# Patient Record
Sex: Male | Born: 1970 | Race: Black or African American | Hispanic: No | Marital: Married | Smoking: Never smoker
Health system: Southern US, Community
[De-identification: ages and names within clinical notes are randomized; demographics above are authoritative.]

## PROBLEM LIST (undated history)

## (undated) ENCOUNTER — Inpatient Hospital Stay (HOSPITAL_COMMUNITY): Admission: RE | Payer: BLUE CROSS/BLUE SHIELD | Source: Intra-hospital | Admitting: Physical Medicine & Rehabilitation

## (undated) DIAGNOSIS — I10 Essential (primary) hypertension: Secondary | ICD-10-CM

## (undated) DIAGNOSIS — E119 Type 2 diabetes mellitus without complications: Secondary | ICD-10-CM

## (undated) DIAGNOSIS — G629 Polyneuropathy, unspecified: Secondary | ICD-10-CM

## (undated) DIAGNOSIS — N186 End stage renal disease: Secondary | ICD-10-CM

## (undated) DIAGNOSIS — M069 Rheumatoid arthritis, unspecified: Secondary | ICD-10-CM

## (undated) DIAGNOSIS — I5032 Chronic diastolic (congestive) heart failure: Secondary | ICD-10-CM

## (undated) DIAGNOSIS — I739 Peripheral vascular disease, unspecified: Secondary | ICD-10-CM

## (undated) DIAGNOSIS — Z9289 Personal history of other medical treatment: Secondary | ICD-10-CM

## (undated) DIAGNOSIS — I4892 Unspecified atrial flutter: Secondary | ICD-10-CM

## (undated) DIAGNOSIS — Z992 Dependence on renal dialysis: Secondary | ICD-10-CM

## (undated) DIAGNOSIS — I499 Cardiac arrhythmia, unspecified: Secondary | ICD-10-CM

## (undated) DIAGNOSIS — J189 Pneumonia, unspecified organism: Secondary | ICD-10-CM

## (undated) DIAGNOSIS — D649 Anemia, unspecified: Secondary | ICD-10-CM

## (undated) HISTORY — DX: Chronic diastolic (congestive) heart failure: I50.32

## (undated) HISTORY — PX: CHOLECYSTECTOMY: SHX55

---
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
## 2001-02-15 ENCOUNTER — Emergency Department (HOSPITAL_COMMUNITY): Admission: EM | Admit: 2001-02-15 | Discharge: 2001-02-16 | Payer: Self-pay | Admitting: Emergency Medicine

## 2004-11-12 ENCOUNTER — Ambulatory Visit (HOSPITAL_COMMUNITY): Admission: RE | Admit: 2004-11-12 | Discharge: 2004-11-12 | Payer: Self-pay | Admitting: Nephrology

## 2008-10-23 ENCOUNTER — Emergency Department (HOSPITAL_COMMUNITY): Admission: EM | Admit: 2008-10-23 | Discharge: 2008-10-23 | Payer: Self-pay | Admitting: Emergency Medicine

## 2010-04-06 ENCOUNTER — Encounter: Payer: Self-pay | Admitting: Nephrology

## 2010-07-18 ENCOUNTER — Emergency Department (HOSPITAL_COMMUNITY): Payer: No Typology Code available for payment source

## 2010-07-18 ENCOUNTER — Emergency Department (HOSPITAL_COMMUNITY)
Admission: EM | Admit: 2010-07-18 | Discharge: 2010-07-18 | Disposition: A | Discharge status: Home / Self Care | Payer: No Typology Code available for payment source | Attending: Emergency Medicine | Admitting: Emergency Medicine

## 2010-07-18 DIAGNOSIS — M549 Dorsalgia, unspecified: Secondary | ICD-10-CM | POA: Insufficient documentation

## 2010-07-18 DIAGNOSIS — M542 Cervicalgia: Secondary | ICD-10-CM | POA: Insufficient documentation

## 2010-07-18 DIAGNOSIS — E119 Type 2 diabetes mellitus without complications: Secondary | ICD-10-CM | POA: Insufficient documentation

## 2010-07-18 DIAGNOSIS — I10 Essential (primary) hypertension: Secondary | ICD-10-CM | POA: Insufficient documentation

## 2010-07-18 DIAGNOSIS — S139XXA Sprain of joints and ligaments of unspecified parts of neck, initial encounter: Secondary | ICD-10-CM | POA: Insufficient documentation

## 2010-07-18 DIAGNOSIS — S335XXA Sprain of ligaments of lumbar spine, initial encounter: Secondary | ICD-10-CM | POA: Insufficient documentation

## 2012-05-23 ENCOUNTER — Emergency Department (HOSPITAL_COMMUNITY): Payer: Self-pay

## 2012-05-23 ENCOUNTER — Encounter (HOSPITAL_COMMUNITY): Payer: Self-pay | Admitting: *Deleted

## 2012-05-23 ENCOUNTER — Emergency Department (HOSPITAL_COMMUNITY)
Admission: EM | Admit: 2012-05-23 | Discharge: 2012-05-24 | Disposition: A | Discharge status: Home / Self Care | Payer: Self-pay | Attending: Emergency Medicine | Admitting: Emergency Medicine

## 2012-05-23 DIAGNOSIS — K089 Disorder of teeth and supporting structures, unspecified: Secondary | ICD-10-CM | POA: Insufficient documentation

## 2012-05-23 DIAGNOSIS — I10 Essential (primary) hypertension: Secondary | ICD-10-CM | POA: Insufficient documentation

## 2012-05-23 DIAGNOSIS — I509 Heart failure, unspecified: Secondary | ICD-10-CM | POA: Insufficient documentation

## 2012-05-23 HISTORY — DX: Essential (primary) hypertension: I10

## 2012-05-23 NOTE — ED Notes (Signed)
Coughing up "rust color"  Phlegm, and dental pain left lower jaw

## 2012-05-23 NOTE — ED Notes (Signed)
[id number] Expected date:<BR> Expected time:<BR> Means of arrival:<BR> Comments:<BR> Drawing blood room for April

## 2012-05-24 LAB — POCT I-STAT, CHEM 8
Calcium, Ion: 1.09 mmol/L — ABNORMAL LOW (ref 1.12–1.23)
Creatinine, Ser: 1.1 mg/dL (ref 0.50–1.35)
Glucose, Bld: 151 mg/dL — ABNORMAL HIGH (ref 70–99)
Hemoglobin: 13.3 g/dL (ref 13.0–17.0)
TCO2: 29 mmol/L (ref 0–100)

## 2012-05-24 MED ORDER — LISINOPRIL 20 MG PO TABS
20.0000 mg | ORAL_TABLET | Freq: Once | ORAL | Status: AC
  Administered 2012-05-24: 20 mg via ORAL
  Filled 2012-05-24: qty 1

## 2012-05-24 MED ORDER — ACETAMINOPHEN 325 MG PO TABS
650.0000 mg | ORAL_TABLET | Freq: Once | ORAL | Status: AC
  Administered 2012-05-24: 650 mg via ORAL
  Filled 2012-05-24: qty 2

## 2012-05-24 MED ORDER — PENICILLIN V POTASSIUM 500 MG PO TABS: 500.0000 mg | ORAL_TABLET | Freq: Three times a day (TID) | ORAL | Status: DC

## 2012-05-24 MED ORDER — HYDROCODONE-ACETAMINOPHEN 5-325 MG PO TABS: 2.0000 | ORAL_TABLET | ORAL | Status: DC | PRN

## 2012-05-24 MED ORDER — HYDROCHLOROTHIAZIDE 25 MG PO TABS
25.0000 mg | ORAL_TABLET | Freq: Once | ORAL | Status: AC
  Administered 2012-05-24: 25 mg via ORAL
  Filled 2012-05-24: qty 1

## 2012-05-24 MED ORDER — PENICILLIN V POTASSIUM 500 MG PO TABS
500.0000 mg | ORAL_TABLET | Freq: Once | ORAL | Status: AC
  Administered 2012-05-24: 500 mg via ORAL
  Filled 2012-05-24: qty 1

## 2012-05-24 NOTE — ED Notes (Signed)
MD at bedside. 

## 2012-05-24 NOTE — ED Provider Notes (Signed)
Complains of cough for one week accompanied. Also complains of toothache for the past several days at left lower premolar. On exam patient is in no distress left lower premolar has a Cari and is tender,. Neck supple no JVD lungs scant rales at bases bilaterally abdomen morbidly obese heart regular rate and rhythm. Extremities without edema  Date: 05/24/2012  Rate: 95  Rhythm: normal sinus rhythm  QRS Axis: normal  Intervals: normal  ST/T Wave abnormalities: nonspecific T wave changes  Conduction Disutrbances:none  Narrative Interpretation:   Old EKG Reviewed: none available      Patient felt to be in mild congestive heart failure. He reports that he's been noncompliant with his lisinopril was and hydrochlorothiazide. He has the medication at home. Spoke with Dr. Margie Ege cardiology. Plan patient to call today to schedule appointment for within the next week. Also given dental referral. Prescription for penicillin, Norco. Diagnosis #1 congestive heart failure #2 dental pain 3 hypertension 4 medication noncompliance  Orlie Dakin, MD 05/24/12 [phone]

## 2012-05-24 NOTE — ED Provider Notes (Signed)
History     CSN: [id number]  Arrival date & time 05/23/12  2207   First MD Initiated Contact with Patient 05/23/12 2358      Chief Complaint  Patient presents with  . Cough    "rust color"  . Dental Pain    left lower jaw    (Consider location/radiation/quality/duration/timing/severity/associated sxs/prior treatment) HPI Comments: Patient presents today with a chief complaint of productive cough for the past week.  Cough gradually worsening.  He has been taking Robitussin for the cough, but does not feel that it is helping.  He denies SOB or chest pain.  Denies fever or chills.  He denies DOE, orthopnea, or PND.  No prior history of CHF.  He currently does not smoke.  No history of COPD or Asthma.  He has a history of HTN and currently takes Lisinopril-HCTZ and Metoprolol.  He reports that he is taking the medication as prescribed.  He took the medication earlier today.  He has also had lower extremity edema bilaterally for the past several months.    He is also complaining of left lower molar dental pain.  He reports that the pain has been present for the past 2 months, which is gradually worsening.  [name]  He has not been taking anything for pain.  No acute dental injury.  He denies fever, chills, or facial swelling.  He currently does not have a dentist.  PCP is Dr. Mare Ferrari.    Patient is a 42 y.o. male presenting with tooth pain. The history is provided by the patient.  Dental PainThe primary symptoms include mouth pain and cough. Primary symptoms do not include dental injury, fever or shortness of breath.  Additional symptoms include: gum swelling and gum tenderness. Additional symptoms do not include: purulent gums, trismus, facial swelling, trouble swallowing and drooling.    Past Medical History  Diagnosis Date  . Hypertension     No past surgical history on file.  History reviewed. No pertinent family history.  History  Substance Use Topics  . Smoking status: Never Smoker    . Smokeless tobacco: Not on file  . Alcohol Use: No      Review of Systems  Constitutional: Negative for fever and chills.  HENT: Positive for congestion, rhinorrhea and dental problem. Negative for facial swelling, drooling and trouble swallowing.   Respiratory: Positive for cough. Negative for shortness of breath.        Denies DOE, orthopnea, or PND  Cardiovascular: Positive for leg swelling. Negative for chest pain.  All other systems reviewed and are negative.    Allergies  Review of patient's allergies indicates no known allergies.  Home Medications  No current outpatient prescriptions on file.  BP 173/122  Pulse 94  Temp(Src) 98.9 F (37.2 C) (Oral)  Resp 21  SpO2 95%  Physical Exam  Nursing note and vitals reviewed. Constitutional: He appears well-developed and well-nourished. No distress.  HENT:  Head: Normocephalic and atraumatic. No trismus in the jaw.  Mouth/Throat: Uvula is midline and oropharynx is clear and moist. Dental caries present. No dental abscesses.  No sublingual tenderness No tongue elevation No sublingual or submental lymphadenopathy Left lower lower carie.  Gingival tenderness to palpation.  No dental abscess.    Eyes: EOM are normal. Pupils are equal, round, and reactive to light.  Neck: Normal range of motion. Neck supple. No JVD present.  Cardiovascular: Normal rate, regular rhythm and normal heart sounds.   Pulmonary/Chest: Effort normal and breath sounds normal.  No respiratory distress. He has no wheezes. He has no rales.  Musculoskeletal:  1+ bilateral pitting edema from mid shin distallly  Neurological: He is alert.  Skin: Skin is warm and dry. He is not diaphoretic.  Psychiatric: He has a normal mood and affect.    ED Course  Procedures (including critical care time)  Labs Reviewed - No data to display Dg Chest 2 View  05/24/2012  *RADIOLOGY REPORT*  Clinical Data: Cough, dental pain  CHEST - 2 VIEW  Comparison: None   Findings: Enlargement of cardiac silhouette with pulmonary vascular congestion. Minimally tortuous thoracic aorta. Peribronchial thickening without focal consolidation or acute failure. No pleural effusion or pneumothorax. No acute osseous findings.  IMPRESSION: Enlargement of cardiac silhouette with pulmonary vascular congestion. Minimal bronchitic changes.   Original Report Authenticated By: Lavonia Dana, M.D.      No diagnosis found.  2:00 AM Patient signed out to Dr. Winfred Leeds.  Cardiology will be consulted to arrange follow up appointment.    MDM  Patient presenting with a productive cough for the past week.  CXR shows vascular congestion and cardiomegaly.  BNP elevated at 3399.  No prior history of CHF.  Patient appears to have a mild new onset CHF.  Patient is not hypoxic and not in respiratory distress.  Dr. Winfred Leeds will contact Cardiology to arrange for close follow up since the patient does not have a Cardiologist at this time.  Patient also presenting with dental pain.  No gross dental abscess.  Exam unconcerning for Ludwig's angina or spread of infection.          Sherlyn Lees [place], PA-C 05/24/12 1304

## 2012-05-26 NOTE — ED Provider Notes (Signed)
Medical screening examination/treatment/procedure(s) were conducted as a shared visit with non-physician practitioner(s) and myself.  I personally evaluated the patient during the encounter  Orlie Dakin, MD 05/26/12 1732

## 2012-06-10 ENCOUNTER — Institutional Professional Consult (permissible substitution): Payer: Self-pay | Admitting: Cardiovascular Disease

## 2012-07-14 ENCOUNTER — Encounter: Payer: Self-pay | Admitting: Cardiovascular Disease

## 2012-07-14 ENCOUNTER — Ambulatory Visit (INDEPENDENT_AMBULATORY_CARE_PROVIDER_SITE_OTHER): Payer: BC Managed Care – PPO | Admitting: Cardiovascular Disease

## 2012-07-14 VITALS — BP 160/120 | HR 88 | Ht 72.0 in | Wt 297.0 lb

## 2012-07-14 DIAGNOSIS — R609 Edema, unspecified: Secondary | ICD-10-CM

## 2012-07-14 DIAGNOSIS — R0609 Other forms of dyspnea: Secondary | ICD-10-CM

## 2012-07-14 DIAGNOSIS — R0600 Dyspnea, unspecified: Secondary | ICD-10-CM | POA: Insufficient documentation

## 2012-07-14 DIAGNOSIS — I110 Hypertensive heart disease with heart failure: Secondary | ICD-10-CM | POA: Insufficient documentation

## 2012-07-14 DIAGNOSIS — I10 Essential (primary) hypertension: Secondary | ICD-10-CM

## 2012-07-14 DIAGNOSIS — E785 Hyperlipidemia, unspecified: Secondary | ICD-10-CM

## 2012-07-14 DIAGNOSIS — R600 Localized edema: Secondary | ICD-10-CM

## 2012-07-14 MED ORDER — CARVEDILOL 25 MG PO TABS: 25.0000 mg | ORAL_TABLET | Freq: Two times a day (BID) | ORAL | Status: DC

## 2012-07-14 MED ORDER — POTASSIUM CHLORIDE CRYS ER 20 MEQ PO TBCR: 20.0000 meq | EXTENDED_RELEASE_TABLET | Freq: Every day | ORAL | Status: DC

## 2012-07-14 NOTE — Progress Notes (Signed)
     John Bailey Date of Birth  [birth year]       [facility] [address], [place], [place]  [address]   [place], [place]  [address] [phone]     [phone]   Fax  [phone]    Fax [phone]  Problem List: 1. Hypertension 2. Congestive heart failure 3. Diabetes mellitus  History of Present Illness:  John Bailey is a 42 year old gentleman with a history of diabetes mellitus and hypertension.  He was recently seen in the [facility] long emergency room with severe shortness breath and was throught to  congestive heart failure.  He presented to the ED with cough. - green / brown sputum.  The cough is better.    No dyspnea, no chest pain, no orthopnea, PND, no syncope.   He is a full time pastor.  He eats an unrestricted diet.  No regular exercise.   Current Outpatient Prescriptions on File Prior to Visit  Medication Sig Dispense Refill  . aspirin EC 81 MG tablet Take 81 mg by mouth daily.      [name] glimepiride (AMARYL) 4 MG tablet Take 4 mg by mouth daily before breakfast.      . lisinopril-hydrochlorothiazide (PRINZIDE,ZESTORETIC) 20-25 MG per tablet Take 1 tablet by mouth daily.      . metFORMIN (GLUCOPHAGE) 500 MG tablet Take 500 mg by mouth daily with breakfast.      . metoprolol (LOPRESSOR) 50 MG tablet Take 50 mg by mouth daily.       No current facility-administered medications on file prior to visit.    No Known Allergies  Past Medical History  Diagnosis Date  . Hypertension     No past surgical history on file.  History  Smoking status  . Never Smoker   Smokeless tobacco  . Not on file    History  Alcohol Use No    No family history on file.  Reviw of Systems:  Reviewed in the HPI.  All other systems are negative.  Physical Exam: Blood pressure 160/120, pulse 88, height 6' (1.829 m), weight 297 lb (134.718 kg). General: Well developed, well nourished, in no acute  distress.  Head: Normocephalic, atraumatic, sclera non-icteric, mucus membranes are moist,   Neck: Supple. Carotids are 2 + without bruits. No JVD   Lungs: Clear   Heart: RR, normal S1, S2, soft S3,   Abdomen: Soft, non-tender, non-distended with normal bowel sounds.obese  Msk:  Strength and tone are normal   Extremities: No clubbing or cyanosis. 1-2 + edema.    Distal pedal pulses are 2+ and equal    Neuro: CN II - XII intact.  Alert and oriented X 3.   Psych:  Normal   ECG: 05/24/12:  NSR at 94.   Assessment / Plan:

## 2012-07-14 NOTE — Assessment & Plan Note (Signed)
He is been on statins in the past. He stopped taking them because he couldn't afford them. Will check fasting lipid profile as next is in 1 month.

## 2012-07-14 NOTE — Patient Instructions (Addendum)
Your physician has requested that you have an echocardiogram. Echocardiography is a painless test that uses sound waves to create images of your heart. It provides your doctor with information about the size and shape of your heart and how well your heart's chambers and valves are working. This procedure takes approximately one hour. There are no restrictions for this procedure.  Your physician recommends that you return for a FASTING lipid profile: 4 weeks at next office visit bmet [place]  Your physician recommends that you schedule a follow-up appointment in: 4 weeks needs fasting labs too  Your physician has recommended you make the following change in your medication:   STOP METOPROLOL START COREG/ CARVEDILOL 25 MG TWICE DAILY 12 HOURS APART BP/HEART MED START KDUR/ POTASSIUM CHLORIDE 20 MEQ DAILY  FOR LOW POTASSIUM  REDUCE HIGH SODIUM FOODS LIKE CANNED SOUP, GRAVY, SAUCES, READY PREPARED FOODS [place]; LEAN CUISINE, LASAGNA. BACON, SAUSAGE, LUNCH MEAT, FAST FOODS.[name] DASH Diet The DASH diet stands for "Dietary Approaches to Stop Hypertension." It is a healthy eating plan that has been shown to reduce high blood pressure (hypertension) in as little as 14 days, while also possibly providing other significant health benefits. These other health benefits include reducing the risk of breast cancer after menopause and reducing the risk of type 2 diabetes, heart disease, colon cancer, and stroke. Health benefits also include weight loss and slowing kidney failure in patients with chronic kidney disease.  DIET GUIDELINES  Limit salt (sodium). Your diet should contain less than 1500 mg of sodium daily.  Limit refined or processed carbohydrates. Your diet should include mostly whole grains. Desserts and added sugars should be used sparingly.  Include small amounts of heart-healthy fats. These types of fats include nuts, oils, and tub margarine. Limit saturated and trans fats. These  fats have been shown to be harmful in the body. CHOOSING FOODS  The following food groups are based on a 2000 calorie diet. See your Registered Dietitian for individual calorie needs. Grains and Grain Products (6 to 8 servings daily)  Eat More Often: Whole-wheat bread, brown rice, whole-grain or wheat pasta, quinoa, popcorn without added fat or salt (air popped).  Eat Less Often: White bread, white pasta, white rice, cornbread. Vegetables (4 to 5 servings daily)  Eat More Often: Fresh, frozen, and canned vegetables. Vegetables may be raw, steamed, roasted, or grilled with a minimal amount of fat.  Eat Less Often/Avoid: Creamed or fried vegetables. Vegetables in a cheese sauce. Fruit (4 to 5 servings daily)  Eat More Often: All fresh, canned (in natural juice), or frozen fruits. Dried fruits without added sugar. One hundred percent fruit juice ( cup [237 mL] daily).  Eat Less Often: Dried fruits with added sugar. Canned fruit in light or heavy syrup. [employer], Fish, and Poultry (2 servings or less daily. One serving is 3 to 4 oz [85-114 g]).  Eat More Often: Ninety percent or leaner ground beef, tenderloin, sirloin. Round cuts of beef, chicken breast, [place] breast. All fish. Grill, bake, or broil your meat. Nothing should be fried.  Eat Less Often/Avoid: Fatty cuts of meat, [place], or chicken leg, thigh, or wing. Fried cuts of meat or fish. Dairy (2 to 3 servings)  Eat More Often: Low-fat or fat-free milk, low-fat plain or light yogurt, reduced-fat or part-skim cheese.  Eat Less Often/Avoid: Milk (whole, 2%).Whole milk yogurt. Full-fat cheeses. Nuts, Seeds, and Legumes (4 to 5 servings per week)  Eat More Often: All without added salt.  Eat Less Often/Avoid: Salted nuts and seeds, canned beans with added salt. Fats and Sweets (limited)  Eat More Often: Vegetable oils, tub margarines without trans fats, sugar-free gelatin. Mayonnaise and salad dressings.  Eat Less Often/Avoid:  Coconut oils, palm oils, butter, stick margarine, cream, half and half, cookies, candy, pie. FOR MORE INFORMATION The Dash Diet Eating Plan: www.dashdiet.org Document Released: 02/19/2011 Document Revised: 05/25/2011 Document Reviewed: 02/19/2011 [facility] Patient Information 2013 [place].

## 2012-07-14 NOTE — Assessment & Plan Note (Signed)
Will change his metoprolol to coreg - 25 mg BID.  DASH diet.

## 2012-07-14 NOTE — Assessment & Plan Note (Addendum)
John Bailey presents today for further evaluation and management of his severe shortness of breath and marked hypertension. Clinically I suspected he sat systolic congestive heart failure. He's had hypertension for many years that has been poorly controlled. He has an S3 gallop. He has 1-2+ leg edema.  He eats a fairly unrestricted diet.  We'll continue with his current dose of lisinopril hydrochlorothiazide.  We will add potassium chloride since his potassium level was low in the emergency room last month. We'll discontinue the metoprolol and and start him on carvedilol 25 mg twice a day. We'll give him information on the DASH diet and about regular exercise.  We had a  long discussion about the effects of weight loss and its benefits with regard to hypertension.  We'll get an echo  For further evaluation of his heart function. I suspect that he has congestive heart failure.  I will see him again 4 weeks for followup visit. We'll check a basic metabolic profile, fasting lipid profile and hepatic profile. at that time.

## 2012-07-18 ENCOUNTER — Other Ambulatory Visit (HOSPITAL_COMMUNITY): Payer: BC Managed Care – PPO

## 2012-07-25 ENCOUNTER — Other Ambulatory Visit: Payer: Self-pay | Admitting: *Deleted

## 2012-07-25 MED ORDER — LISINOPRIL-HYDROCHLOROTHIAZIDE 20-25 MG PO TABS: 1.0000 | ORAL_TABLET | Freq: Every day | ORAL | Status: DC

## 2012-07-26 ENCOUNTER — Ambulatory Visit: Payer: BC Managed Care – PPO | Admitting: Internal Medicine

## 2012-07-26 DIAGNOSIS — Z0289 Encounter for other administrative examinations: Secondary | ICD-10-CM

## 2012-07-27 ENCOUNTER — Other Ambulatory Visit: Payer: Self-pay

## 2012-07-27 ENCOUNTER — Ambulatory Visit (HOSPITAL_COMMUNITY): Payer: BC Managed Care – PPO | Attending: Cardiovascular Disease | Admitting: Radiology

## 2012-07-27 DIAGNOSIS — R600 Localized edema: Secondary | ICD-10-CM

## 2012-07-27 DIAGNOSIS — R0600 Dyspnea, unspecified: Secondary | ICD-10-CM

## 2012-07-27 DIAGNOSIS — I509 Heart failure, unspecified: Secondary | ICD-10-CM | POA: Insufficient documentation

## 2012-07-27 DIAGNOSIS — R609 Edema, unspecified: Secondary | ICD-10-CM | POA: Insufficient documentation

## 2012-07-27 DIAGNOSIS — E785 Hyperlipidemia, unspecified: Secondary | ICD-10-CM | POA: Insufficient documentation

## 2012-07-27 DIAGNOSIS — R0609 Other forms of dyspnea: Secondary | ICD-10-CM | POA: Insufficient documentation

## 2012-07-27 DIAGNOSIS — R0989 Other specified symptoms and signs involving the circulatory and respiratory systems: Secondary | ICD-10-CM

## 2012-07-27 DIAGNOSIS — I10 Essential (primary) hypertension: Secondary | ICD-10-CM | POA: Insufficient documentation

## 2012-07-27 DIAGNOSIS — E119 Type 2 diabetes mellitus without complications: Secondary | ICD-10-CM | POA: Insufficient documentation

## 2012-07-27 DIAGNOSIS — E669 Obesity, unspecified: Secondary | ICD-10-CM | POA: Insufficient documentation

## 2012-07-27 DIAGNOSIS — I079 Rheumatic tricuspid valve disease, unspecified: Secondary | ICD-10-CM | POA: Insufficient documentation

## 2012-07-27 NOTE — Progress Notes (Signed)
Echocardiogram performed.  

## 2012-08-03 ENCOUNTER — Telehealth: Payer: Self-pay | Admitting: *Deleted

## 2012-08-03 NOTE — Telephone Encounter (Signed)
Echo Results reviewed with pt, reviewed reduction in salt and htn control with medication compliance, asked pt to check bp at home periodically and call with questions or concerns, he verbalized understanding.

## 2012-08-10 ENCOUNTER — Ambulatory Visit: Payer: BC Managed Care – PPO | Admitting: Cardiovascular Disease

## 2012-08-10 ENCOUNTER — Other Ambulatory Visit: Payer: BC Managed Care – PPO

## 2012-12-29 ENCOUNTER — Emergency Department (HOSPITAL_COMMUNITY)
Admission: EM | Admit: 2012-12-29 | Discharge: 2012-12-29 | Disposition: A | Discharge status: Home / Self Care | Payer: BC Managed Care – PPO | Source: Home / Self Care

## 2012-12-29 ENCOUNTER — Emergency Department (INDEPENDENT_AMBULATORY_CARE_PROVIDER_SITE_OTHER): Payer: BC Managed Care – PPO

## 2012-12-29 ENCOUNTER — Encounter (HOSPITAL_COMMUNITY): Payer: Self-pay | Admitting: Emergency Medicine

## 2012-12-29 DIAGNOSIS — I119 Hypertensive heart disease without heart failure: Secondary | ICD-10-CM

## 2012-12-29 DIAGNOSIS — I10 Essential (primary) hypertension: Secondary | ICD-10-CM

## 2012-12-29 DIAGNOSIS — K047 Periapical abscess without sinus: Secondary | ICD-10-CM

## 2012-12-29 DIAGNOSIS — J069 Acute upper respiratory infection, unspecified: Secondary | ICD-10-CM

## 2012-12-29 DIAGNOSIS — J9801 Acute bronchospasm: Secondary | ICD-10-CM

## 2012-12-29 DIAGNOSIS — R0989 Other specified symptoms and signs involving the circulatory and respiratory systems: Secondary | ICD-10-CM

## 2012-12-29 DIAGNOSIS — J189 Pneumonia, unspecified organism: Secondary | ICD-10-CM

## 2012-12-29 MED ORDER — METHYLPREDNISOLONE 4 MG PO KIT: PACK | ORAL | Status: DC

## 2012-12-29 MED ORDER — ALBUTEROL SULFATE (5 MG/ML) 0.5% IN NEBU
INHALATION_SOLUTION | RESPIRATORY_TRACT | Status: AC
  Filled 2012-12-29: qty 1

## 2012-12-29 MED ORDER — FLUTICASONE PROPIONATE 50 MCG/ACT NA SUSP: 2.0000 | Freq: Every day | NASAL | Status: DC

## 2012-12-29 MED ORDER — METHYLPREDNISOLONE SODIUM SUCC 125 MG IJ SOLR
INTRAMUSCULAR | Status: AC
  Filled 2012-12-29: qty 2

## 2012-12-29 MED ORDER — IPRATROPIUM BROMIDE 0.02 % IN SOLN
RESPIRATORY_TRACT | Status: AC
  Filled 2012-12-29: qty 2.5

## 2012-12-29 MED ORDER — ALBUTEROL SULFATE HFA 108 (90 BASE) MCG/ACT IN AERS: 1.0000 | INHALATION_SPRAY | Freq: Four times a day (QID) | RESPIRATORY_TRACT | Status: DC | PRN

## 2012-12-29 MED ORDER — SODIUM CHLORIDE 0.9 % IJ SOLN
INTRAMUSCULAR | Status: AC
  Filled 2012-12-29: qty 3

## 2012-12-29 MED ORDER — IPRATROPIUM BROMIDE 0.02 % IN SOLN
0.5000 mg | Freq: Once | RESPIRATORY_TRACT | Status: AC
  Administered 2012-12-29: 0.5 mg via RESPIRATORY_TRACT

## 2012-12-29 MED ORDER — ALBUTEROL SULFATE (5 MG/ML) 0.5% IN NEBU
5.0000 mg | INHALATION_SOLUTION | Freq: Once | RESPIRATORY_TRACT | Status: AC
  Administered 2012-12-29: 5 mg via RESPIRATORY_TRACT

## 2012-12-29 MED ORDER — METHYLPREDNISOLONE SODIUM SUCC 125 MG IJ SOLR
125.0000 mg | Freq: Once | INTRAMUSCULAR | Status: AC
  Administered 2012-12-29: 125 mg via INTRAMUSCULAR

## 2012-12-29 MED ORDER — AZITHROMYCIN 250 MG PO TABS: 250.0000 mg | ORAL_TABLET | Freq: Every day | ORAL | Status: DC

## 2012-12-29 NOTE — ED Provider Notes (Signed)
Medical screening examination/treatment/procedure(s) were performed by non-physician practitioner and as supervising physician I was immediately available for consultation/collaboration.  Philipp Deputy, M.D.  Harden Mo, MD 12/29/12 [phone]

## 2012-12-29 NOTE — ED Provider Notes (Signed)
CSN: [id number]     Arrival date & time 12/29/12  0945 History   First MD Initiated Contact with Patient 12/29/12 1025     Chief Complaint  Patient presents with  . URI  . Dental Pain   (Consider location/radiation/quality/duration/timing/severity/associated sxs/prior Treatment) HPI Comments: 42 year old man complaint of rattling in his chest cough and cold symptoms for one week. Is also complaining of an abscess to the left lower tooth for 7 days.   Past Medical History  Diagnosis Date  . Hypertension    History reviewed. No pertinent past surgical history. History reviewed. No pertinent family history. History  Substance Use Topics  . Smoking status: Never Smoker   . Smokeless tobacco: Not on file  . Alcohol Use: No    Review of Systems  Constitutional: Positive for activity change. Negative for fever, diaphoresis and fatigue.  HENT: Positive for congestion, postnasal drip, rhinorrhea, sinus pressure and trouble swallowing. Negative for ear pain, facial swelling, mouth sores and sore throat.   Eyes: Negative for pain, discharge and redness.  Respiratory: Positive for cough. Negative for chest tightness, shortness of breath and wheezing.   Cardiovascular: Negative.   Gastrointestinal: Negative.   Musculoskeletal: Negative.  Negative for neck pain and neck stiffness.  Neurological: Negative.     Allergies  Review of patient's allergies indicates no known allergies.  Home Medications   Current Outpatient Rx  Name  Route  Sig  Dispense  Refill  . aspirin EC 81 MG tablet   Oral   Take 81 mg by mouth daily.         . carvedilol (COREG) 25 MG tablet   Oral   Take 1 tablet (25 mg total) by mouth 2 (two) times daily.   60 tablet   6     May have 90 day supply   . glimepiride (AMARYL) 4 MG tablet   Oral   Take 4 mg by mouth daily before breakfast.         . lisinopril-hydrochlorothiazide (PRINZIDE,ZESTORETIC) 20-25 MG per tablet   Oral   Take 1 tablet by  mouth daily.   90 tablet   3   . metFORMIN (GLUCOPHAGE) 500 MG tablet   Oral   Take 500 mg by mouth daily with breakfast.         . potassium chloride SA (K-DUR,KLOR-CON) 20 MEQ tablet   Oral   Take 1 tablet (20 mEq total) by mouth daily.   30 tablet   6     May have 90 day supply   . albuterol (PROVENTIL HFA;VENTOLIN HFA) 108 (90 BASE) MCG/ACT inhaler   Inhalation   Inhale 1-2 puffs into the lungs every 6 (six) hours as needed for wheezing.   1 Inhaler   0   . azithromycin (ZITHROMAX) 250 MG tablet   Oral   Take 1 tablet (250 mg total) by mouth daily. 2 tabs po on day 1, 1 tab po on days 2-5   6 tablet   0   . fluticasone (FLONASE) 50 MCG/ACT nasal spray   Nasal   Place 2 sprays into the nose daily.   16 g   2   . methylPREDNISolone (MEDROL DOSEPAK) 4 MG tablet      follow package directions. Start 12/30/12. Take with food   21 tablet   0    BP 168/117  Pulse 96  Temp(Src) 98.8 F (37.1 C) (Oral)  Resp 16  SpO2 98% Physical Exam  Nursing note and vitals  reviewed. Constitutional: He is oriented to person, place, and time. He appears well-developed and well-nourished. No distress.  HENT:  Right Ear: External ear normal.  Left Ear: External ear normal.  OP with mild erythema. PND, faint red tinged.  Small pustule with erythema and minor swelling to gingiva associated with the L lower 2nd molar.   Neck: Normal range of motion. Neck supple.  Cardiovascular: Normal rate and regular rhythm.   Pulmonary/Chest: Effort normal and breath sounds normal. No respiratory distress.  Musculoskeletal: Normal range of motion. He exhibits no edema.  Lymphadenopathy:    He has no cervical adenopathy.  Neurological: He is alert and oriented to person, place, and time.  Skin: Skin is warm and dry. No rash noted.  Psychiatric: He has a normal mood and affect.    ED Course  Procedures (including critical care time) Labs Review Labs Reviewed - No data to  display Imaging Review Dg Chest 2 View  12/29/2012   CLINICAL DATA:  Productive cough  EXAM: CHEST  2 VIEW  COMPARISON:  May 23, 2012  FINDINGS: There is cardiomegaly with pulmonary venous hypertension. There is mild edema. There is patchy the airspace opacity in the left base. No adenopathy. No pneumothorax. No bone lesions.  IMPRESSION: Evidence of a degree of congestive heart failure. Question a degree of superimposed pneumonia left base.   Electronically Signed   By: Lowella Grip M.D.   On: 12/29/2012 11:52      MDM   1. URI (upper respiratory infection)   2. Bronchospasm   3. Community acquired pneumonia   4. Chest rales   5. Cardiomegaly - hypertensive   6. HTN (hypertension)     1115h: post duoneb pt st feeling and breathing better. Moderate decrease in wheezes, now with bibasilar crackles and expiratory phase remains prolonged. Repeat Albuterol neb 20 min post 1st duoneb. Solumedrol 125mg   IM added 1127h. Post albuterol neb st breathing better and there is decreased wheeze and improved air movement. Bilateral lower field fine crackles remain. Medrol dose pack starting tomorrow Albuterol HFA 2 puffs every 4-6 hours when necessary cough and wheeze Fluticasone nasal spray as directed Copious amounts of nasal saline frequently. Claritin or Allegra antihistamine for drainage and Robitussin DM. z-pack After obtaining the chest X. ray report the patient advised me that he has seen a cardiologist and has been evaluated for congestive heart failure. He states his cardiologist tells him he does not have congestive heart failure. The patient has an appointment with his cardiologist next week. He will need to have attention for his hypertension. If he gets worse or has new symptoms or problems he should go to emergency department. NOte dental abscess is resolving. Janne Napoleon, NP 12/29/12 [place], NP 12/29/12 [phone]

## 2012-12-29 NOTE — ED Notes (Signed)
C/o cold symptoms and tooth pain.  States pinky red mucous productive cough, sneezing, body ache, on and off headache with pressure.  Cough syrup taken but no relief.

## 2013-08-10 ENCOUNTER — Other Ambulatory Visit: Payer: Self-pay | Admitting: *Deleted

## 2013-08-10 DIAGNOSIS — R600 Localized edema: Secondary | ICD-10-CM

## 2013-08-10 DIAGNOSIS — I10 Essential (primary) hypertension: Secondary | ICD-10-CM

## 2013-08-10 DIAGNOSIS — E785 Hyperlipidemia, unspecified: Secondary | ICD-10-CM

## 2013-08-10 DIAGNOSIS — R0600 Dyspnea, unspecified: Secondary | ICD-10-CM

## 2013-08-10 MED ORDER — CARVEDILOL 25 MG PO TABS
25.0000 mg | ORAL_TABLET | Freq: Two times a day (BID) | ORAL | Status: DC
Start: 2013-08-10 — End: 2013-10-09

## 2013-10-09 ENCOUNTER — Other Ambulatory Visit: Payer: Self-pay | Admitting: *Deleted

## 2013-10-09 DIAGNOSIS — E785 Hyperlipidemia, unspecified: Secondary | ICD-10-CM

## 2013-10-09 DIAGNOSIS — R600 Localized edema: Secondary | ICD-10-CM

## 2013-10-09 DIAGNOSIS — R0600 Dyspnea, unspecified: Secondary | ICD-10-CM

## 2013-10-09 MED ORDER — LISINOPRIL-HYDROCHLOROTHIAZIDE 20-25 MG PO TABS: 1.0000 | ORAL_TABLET | Freq: Every day | ORAL | Status: DC

## 2013-10-09 MED ORDER — POTASSIUM CHLORIDE CRYS ER 20 MEQ PO TBCR: 20.0000 meq | EXTENDED_RELEASE_TABLET | Freq: Every day | ORAL | Status: DC

## 2013-10-09 MED ORDER — CARVEDILOL 25 MG PO TABS: 25.0000 mg | ORAL_TABLET | Freq: Two times a day (BID) | ORAL | Status: DC

## 2013-10-09 NOTE — Telephone Encounter (Signed)
Patient calling to make an appointment and have Dr Acie Fredrickson refill his heart medications.

## 2013-10-23 ENCOUNTER — Encounter: Payer: Self-pay | Admitting: Internal Medicine

## 2013-11-02 ENCOUNTER — Other Ambulatory Visit: Payer: Self-pay

## 2013-11-02 DIAGNOSIS — R0600 Dyspnea, unspecified: Secondary | ICD-10-CM

## 2013-11-02 DIAGNOSIS — R600 Localized edema: Secondary | ICD-10-CM

## 2013-11-02 DIAGNOSIS — E785 Hyperlipidemia, unspecified: Secondary | ICD-10-CM

## 2013-11-02 MED ORDER — LISINOPRIL-HYDROCHLOROTHIAZIDE 20-25 MG PO TABS: 1.0000 | ORAL_TABLET | Freq: Every day | ORAL | Status: DC

## 2013-11-02 MED ORDER — CARVEDILOL 25 MG PO TABS: 25.0000 mg | ORAL_TABLET | Freq: Two times a day (BID) | ORAL | Status: DC

## 2013-11-02 MED ORDER — POTASSIUM CHLORIDE CRYS ER 20 MEQ PO TBCR
20.0000 meq | EXTENDED_RELEASE_TABLET | Freq: Every day | ORAL | Status: DC
Start: 2013-11-02 — End: 2014-02-24

## 2013-11-07 ENCOUNTER — Ambulatory Visit: Payer: BC Managed Care – PPO | Admitting: Cardiovascular Disease

## 2013-12-11 ENCOUNTER — Ambulatory Visit: Payer: BC Managed Care – PPO | Admitting: Cardiovascular Disease

## 2014-01-09 ENCOUNTER — Ambulatory Visit: Payer: BC Managed Care – PPO | Admitting: Cardiovascular Disease

## 2014-02-24 ENCOUNTER — Other Ambulatory Visit: Payer: Self-pay | Admitting: Cardiovascular Disease

## 2014-05-17 ENCOUNTER — Telehealth: Payer: Self-pay | Admitting: *Deleted

## 2014-05-17 NOTE — Telephone Encounter (Signed)
I mentioned to the patient that he could go to urgent care. He stated that he is just now getting his insurance coverage back.

## 2014-05-17 NOTE — Telephone Encounter (Signed)
The Urgent care would be a better and less expensive choice ( instead of the ER)  He needs to get a primary medical doctor

## 2014-05-17 NOTE — Telephone Encounter (Signed)
Per Dr. Acie Fredrickson he will not fill patient's metformin or glimepiride; states he needs to have f/u for these medications with primary care or endocrinology.

## 2014-05-17 NOTE — Telephone Encounter (Signed)
Patient called and stated that he would like for Dr Acie Fredrickson to refill his metformin and glimepiride. He stated that he does not have a pcp and that Dr Acie Fredrickson has refilled these for him before. Please advise. Thanks, MI

## 2014-05-17 NOTE — Telephone Encounter (Signed)
Patient aware and stated that he will just have to go to the emergency room then as he is in bad shape from not having these medications.

## 2014-05-20 ENCOUNTER — Other Ambulatory Visit: Payer: Self-pay | Admitting: Cardiovascular Disease

## 2014-05-23 ENCOUNTER — Other Ambulatory Visit: Payer: Self-pay | Admitting: Cardiovascular Disease

## 2014-05-30 ENCOUNTER — Encounter: Payer: Self-pay | Admitting: Cardiovascular Disease

## 2014-05-30 ENCOUNTER — Ambulatory Visit (INDEPENDENT_AMBULATORY_CARE_PROVIDER_SITE_OTHER): Payer: Self-pay | Admitting: Cardiovascular Disease

## 2014-05-30 VITALS — BP 170/114 | HR 83 | Ht 72.0 in | Wt 297.8 lb

## 2014-05-30 DIAGNOSIS — E785 Hyperlipidemia, unspecified: Secondary | ICD-10-CM

## 2014-05-30 DIAGNOSIS — I119 Hypertensive heart disease without heart failure: Secondary | ICD-10-CM | POA: Insufficient documentation

## 2014-05-30 DIAGNOSIS — I5032 Chronic diastolic (congestive) heart failure: Secondary | ICD-10-CM

## 2014-05-30 DIAGNOSIS — I110 Hypertensive heart disease with heart failure: Secondary | ICD-10-CM

## 2014-05-30 DIAGNOSIS — I10 Essential (primary) hypertension: Secondary | ICD-10-CM

## 2014-05-30 DIAGNOSIS — R0600 Dyspnea, unspecified: Secondary | ICD-10-CM

## 2014-05-30 LAB — BASIC METABOLIC PANEL
BUN: 18 mg/dL (ref 6–23)
CALCIUM: 8.4 mg/dL (ref 8.4–10.5)
CO2: 29 meq/L (ref 19–32)
CREATININE: 1.28 mg/dL (ref 0.40–1.50)
Chloride: 104 mEq/L (ref 96–112)
GFR: 79.44 mL/min (ref >60.00)
Glucose, Bld: 178 mg/dL — ABNORMAL HIGH (ref 70–99)
Potassium: 3.9 mEq/L (ref 3.5–5.1)
Sodium: 137 mEq/L (ref 135–145)

## 2014-05-30 MED ORDER — POTASSIUM CHLORIDE CRYS ER 20 MEQ PO TBCR: 20.0000 meq | EXTENDED_RELEASE_TABLET | Freq: Every day | ORAL | Status: DC

## 2014-05-30 MED ORDER — LISINOPRIL-HYDROCHLOROTHIAZIDE 20-25 MG PO TABS: 1.0000 | ORAL_TABLET | Freq: Every day | ORAL | Status: DC

## 2014-05-30 MED ORDER — ALBUTEROL SULFATE HFA 108 (90 BASE) MCG/ACT IN AERS: 1.0000 | INHALATION_SPRAY | Freq: Four times a day (QID) | RESPIRATORY_TRACT | Status: DC | PRN

## 2014-05-30 MED ORDER — SPIRONOLACTONE 25 MG PO TABS: 25.0000 mg | ORAL_TABLET | Freq: Every day | ORAL | Status: DC

## 2014-05-30 NOTE — Patient Instructions (Addendum)
Your physician has recommended you make the following change in your medication:  START Aldactone 25 mg once daily  Your physician has requested that you have a cardiac MRI. Cardiac MRI uses a computer to create images of your heart as its beating, producing both still and moving pictures of your heart and major blood vessels. For further information please visit [email or web address] Please follow the instruction sheet given to you today for more information.  Your physician recommends that you have lab work:  TODAY -  bmet   Your physician recommends that you return for lab work in: 1 week - bmet  Your physician recommends that you schedule a follow-up appointment in: 6 weeks with Dr. Acie Fredrickson

## 2014-05-30 NOTE — Progress Notes (Signed)
Cardiology Office Note   Date:  05/30/2014   ID:  John Bailey, DOB September 01, 1970, MRN [id number]  PCP:  No PCP Per Patient  Cardiologist:   Thayer Headings, MD   Chief Complaint  Patient presents with  . Shortness of Breath   1. Hypertension 2. Congestive heart failure 3. Diabetes mellitus  History of Present Illness:  John Bailey is a 44 year old gentleman with a history of diabetes mellitus and hypertension. He was recently seen in the [facility] long emergency room with severe shortness breath and was throught to congestive heart failure.  He presented to the ED with cough. - green / brown sputum. The cough is better.   No dyspnea, no chest pain, no orthopnea, PND, no syncope.   He is a full time pastor. He eats an unrestricted diet. No regular exercise.   May 30, 2014:  John Bailey is a 44 y.o. male who presents for follow up of his dyspnea. His echo following his last visit showed:  - Left ventricle: There was severe concentric hypertrophy. Systolic function was normal. The estimated ejection fraction was in the range of 50% to 55%. Diffuse hypokinesis. Doppler parameters are consistent with an irreversible restrictive pattern, indicative of decreased left ventricular diastolic compliance and/or increased left atrial pressure (grade 4 diastolic dysfunction). - Left atrium: The atrium was moderately dilated. - Right ventricle: The cavity size was mildly dilated. Systolic function was reduced. - Right atrium: The atrium was mildly dilated. - Pericardium, extracardiac: A small pericardial effusion was identified circumferential to the heart. - Impressions: Cannot r/o amyloid  Still has some wheezing.  Still has lots of leg swelling .has not b een watching his salt .  Has had HTN for years.  + fam. Hx of HTN - father   Past Medical History  Diagnosis Date  . Hypertension     No past surgical history on file.   Current Outpatient  Prescriptions  Medication Sig Dispense Refill  . albuterol (PROVENTIL HFA;VENTOLIN HFA) 108 (90 BASE) MCG/ACT inhaler Inhale 1-2 puffs into the lungs every 6 (six) hours as needed for wheezing. 1 Inhaler 0  . aspirin EC 81 MG tablet Take 81 mg by mouth daily.    [name] azithromycin (ZITHROMAX) 250 MG tablet Take 1 tablet (250 mg total) by mouth daily. 2 tabs po on day 1, 1 tab po on days 2-5 6 tablet 0  . carvedilol (COREG) 25 MG tablet TAKE 1 TABLET (25 MG TOTAL) BY MOUTH 2 (TWO) TIMES DAILY. 30 tablet 0  . fluticasone (FLONASE) 50 MCG/ACT nasal spray Place 2 sprays into the nose daily. 16 g 2  . glimepiride (AMARYL) 4 MG tablet Take 4 mg by mouth daily before breakfast.    . KLOR-CON M20 20 MEQ tablet TAKE 1 TABLET (20 MEQ TOTAL) BY MOUTH DAILY. 30 tablet 0  . lisinopril-hydrochlorothiazide (PRINZIDE,ZESTORETIC) 20-25 MG per tablet TAKE 1 TABLET BY MOUTH DAILY. 30 tablet 0  . lisinopril-hydrochlorothiazide (PRINZIDE,ZESTORETIC) 20-25 MG per tablet TAKE 1 TABLET BY MOUTH DAILY. 30 tablet 0  . metFORMIN (GLUCOPHAGE) 500 MG tablet Take 500 mg by mouth daily with breakfast.    . methylPREDNISolone (MEDROL DOSEPAK) 4 MG tablet follow package directions. Start 12/30/12. Take with food 21 tablet 0   No current facility-administered medications for this visit.    Allergies:   Review of patient's allergies indicates no known allergies.    Social History:  The patient  reports that he has never smoked. He does not have  any smokeless tobacco history on file. He reports that he does not drink alcohol or use illicit drugs.   Family History:  The patient's family history includes Heart disease in his mother; Hyperlipidemia in his father; Hypertension in his father.    ROS:  Please see the history of present illness.    Review of Systems: Constitutional:  denies fever, chills, diaphoresis, appetite change and fatigue.  HEENT: denies photophobia, eye pain, redness, hearing loss, ear pain, congestion, sore  throat, rhinorrhea, sneezing, neck pain, neck stiffness and tinnitus.  Respiratory: denies SOB, DOE, cough, chest tightness, and wheezing.  Cardiovascular: denies chest pain, palpitations and leg swelling.  Gastrointestinal: denies nausea, vomiting, abdominal pain, diarrhea, constipation, blood in stool.  Genitourinary: denies dysuria, urgency, frequency, hematuria, flank pain and difficulty urinating.  Musculoskeletal: denies  myalgias, back pain, joint swelling, arthralgias and gait problem.   Skin: denies pallor, rash and wound.  Neurological: denies dizziness, seizures, syncope, weakness, light-headedness, numbness and headaches.   Hematological: denies adenopathy, easy bruising, personal or family bleeding history.  Psychiatric/ Behavioral: denies suicidal ideation, mood changes, confusion, nervousness, sleep disturbance and agitation.       All other systems are reviewed and negative.    PHYSICAL EXAM: VS:  Pulse 83  Ht 6' (1.829 m)  Wt 297 lb 12.8 oz (135.081 kg)  BMI 40.38 kg/m2 , BMI Body mass index is 40.38 kg/(m^2). GEN: Well nourished, well developed, in no acute distress HEENT: normal Neck: no JVD, carotid bruits, or masses Cardiac: RRR; no murmurs, rubs, or gallops,no edema  Respiratory:  clear to auscultation bilaterally, normal work of breathing GI: soft, nontender, nondistended, + BS MS: no deformity or atrophy Skin: warm and dry, no rash Neuro:  Strength and sensation are intact Psych: normal   EKG:  EKG is ordered today. The ekg ordered today demonstrates NSR at 83. LAE, RBBB    Recent Labs: No results found for requested labs within last 365 days.    Lipid Panel No results found for: CHOL, TRIG, HDL, CHOLHDL, VLDL, LDLCALC, LDLDIRECT    Wt Readings from Last 3 Encounters:  05/30/14 297 lb 12.8 oz (135.081 kg)  07/14/12 297 lb (134.718 kg)      Other studies Reviewed: Additional studies/ records that were reviewed today include: . Review of the  above records demonstrates:    ASSESSMENT AND PLAN:  1. Hypertension - his BP remains elevated .  He still eats salty foods.   Have reminded him to avoid salt. Will start Aldactone 25 a day.  Check BMP in 1 week.  Continue current meds otherwise.   2. Chronic Diastolic  Congestive heart failure - he has marked LVH on echo.  He has diastolic dysfunction with well preserved LV function.  3. Diabetes mellitus - followed by primary medical doctor    Current medicines are reviewed at length with the patient today.  The patient does not have concerns regarding medicines.  The following changes have been made:  no change  Labs/ tests ordered today include:  No orders of the defined types were placed in this encounter.     Disposition:   FU with me in 6 weeks.     Signed, Nahser, Wonda Cheng, MD  05/30/2014 8:14 AM    [facility] [place], [place], [place]  [address] Phone: [phone]; Fax: [phone]

## 2014-05-31 ENCOUNTER — Encounter: Payer: Self-pay | Admitting: Cardiovascular Disease

## 2014-06-04 ENCOUNTER — Other Ambulatory Visit: Payer: Self-pay

## 2014-06-05 ENCOUNTER — Ambulatory Visit (HOSPITAL_COMMUNITY)
Admission: RE | Admit: 2014-06-05 | Discharge: 2014-06-05 | Disposition: A | Discharge status: Home / Self Care | Payer: 59 | Source: Ambulatory Visit | Attending: Cardiovascular Disease | Admitting: Cardiovascular Disease

## 2014-06-05 DIAGNOSIS — I110 Hypertensive heart disease with heart failure: Secondary | ICD-10-CM

## 2014-07-11 ENCOUNTER — Ambulatory Visit: Payer: Self-pay | Admitting: Cardiovascular Disease

## 2014-07-30 ENCOUNTER — Encounter: Payer: Self-pay | Admitting: Cardiovascular Disease

## 2015-07-30 ENCOUNTER — Encounter (HOSPITAL_COMMUNITY): Payer: Self-pay

## 2015-07-30 ENCOUNTER — Inpatient Hospital Stay (HOSPITAL_COMMUNITY)
Admission: AD | Admit: 2015-07-30 | Discharge: 2015-08-08 | DRG: 308 | Disposition: A | Discharge status: Home / Self Care | Payer: BLUE CROSS/BLUE SHIELD | Source: Ambulatory Visit | Attending: Internal Medicine | Admitting: Internal Medicine

## 2015-07-30 ENCOUNTER — Inpatient Hospital Stay (HOSPITAL_COMMUNITY): Payer: BLUE CROSS/BLUE SHIELD

## 2015-07-30 DIAGNOSIS — I081 Rheumatic disorders of both mitral and tricuspid valves: Secondary | ICD-10-CM | POA: Diagnosis present

## 2015-07-30 DIAGNOSIS — Z79899 Other long term (current) drug therapy: Secondary | ICD-10-CM

## 2015-07-30 DIAGNOSIS — E1121 Type 2 diabetes mellitus with diabetic nephropathy: Secondary | ICD-10-CM | POA: Diagnosis present

## 2015-07-30 DIAGNOSIS — I480 Paroxysmal atrial fibrillation: Principal | ICD-10-CM | POA: Diagnosis present

## 2015-07-30 DIAGNOSIS — E870 Hyperosmolality and hypernatremia: Secondary | ICD-10-CM | POA: Diagnosis present

## 2015-07-30 DIAGNOSIS — I5043 Acute on chronic combined systolic (congestive) and diastolic (congestive) heart failure: Secondary | ICD-10-CM | POA: Diagnosis present

## 2015-07-30 DIAGNOSIS — Z7984 Long term (current) use of oral hypoglycemic drugs: Secondary | ICD-10-CM | POA: Diagnosis not present

## 2015-07-30 DIAGNOSIS — N184 Chronic kidney disease, stage 4 (severe): Secondary | ICD-10-CM | POA: Diagnosis present

## 2015-07-30 DIAGNOSIS — I5033 Acute on chronic diastolic (congestive) heart failure: Secondary | ICD-10-CM

## 2015-07-30 DIAGNOSIS — I4892 Unspecified atrial flutter: Secondary | ICD-10-CM | POA: Diagnosis present

## 2015-07-30 DIAGNOSIS — I5032 Chronic diastolic (congestive) heart failure: Secondary | ICD-10-CM

## 2015-07-30 DIAGNOSIS — E871 Hypo-osmolality and hyponatremia: Secondary | ICD-10-CM | POA: Diagnosis present

## 2015-07-30 DIAGNOSIS — Z7982 Long term (current) use of aspirin: Secondary | ICD-10-CM

## 2015-07-30 DIAGNOSIS — I10 Essential (primary) hypertension: Secondary | ICD-10-CM

## 2015-07-30 DIAGNOSIS — R9431 Abnormal electrocardiogram [ECG] [EKG]: Secondary | ICD-10-CM | POA: Diagnosis not present

## 2015-07-30 DIAGNOSIS — I4510 Unspecified right bundle-branch block: Secondary | ICD-10-CM | POA: Diagnosis present

## 2015-07-30 DIAGNOSIS — E785 Hyperlipidemia, unspecified: Secondary | ICD-10-CM

## 2015-07-30 DIAGNOSIS — E875 Hyperkalemia: Secondary | ICD-10-CM | POA: Diagnosis present

## 2015-07-30 DIAGNOSIS — I4891 Unspecified atrial fibrillation: Secondary | ICD-10-CM | POA: Diagnosis not present

## 2015-07-30 DIAGNOSIS — Z6841 Body Mass Index (BMI) 40.0 and over, adult: Secondary | ICD-10-CM

## 2015-07-30 DIAGNOSIS — E1165 Type 2 diabetes mellitus with hyperglycemia: Secondary | ICD-10-CM | POA: Diagnosis present

## 2015-07-30 DIAGNOSIS — E1129 Type 2 diabetes mellitus with other diabetic kidney complication: Secondary | ICD-10-CM

## 2015-07-30 DIAGNOSIS — I43 Cardiomyopathy in diseases classified elsewhere: Secondary | ICD-10-CM | POA: Diagnosis present

## 2015-07-30 DIAGNOSIS — R0602 Shortness of breath: Secondary | ICD-10-CM

## 2015-07-30 DIAGNOSIS — R531 Weakness: Secondary | ICD-10-CM | POA: Diagnosis present

## 2015-07-30 DIAGNOSIS — N179 Acute kidney failure, unspecified: Secondary | ICD-10-CM | POA: Diagnosis present

## 2015-07-30 DIAGNOSIS — I509 Heart failure, unspecified: Secondary | ICD-10-CM

## 2015-07-30 DIAGNOSIS — R0600 Dyspnea, unspecified: Secondary | ICD-10-CM

## 2015-07-30 DIAGNOSIS — I130 Hypertensive heart and chronic kidney disease with heart failure and stage 1 through stage 4 chronic kidney disease, or unspecified chronic kidney disease: Secondary | ICD-10-CM | POA: Diagnosis present

## 2015-07-30 DIAGNOSIS — Z8249 Family history of ischemic heart disease and other diseases of the circulatory system: Secondary | ICD-10-CM

## 2015-07-30 DIAGNOSIS — Z794 Long term (current) use of insulin: Secondary | ICD-10-CM

## 2015-07-30 DIAGNOSIS — I5041 Acute combined systolic (congestive) and diastolic (congestive) heart failure: Secondary | ICD-10-CM | POA: Diagnosis not present

## 2015-07-30 DIAGNOSIS — I110 Hypertensive heart disease with heart failure: Secondary | ICD-10-CM

## 2015-07-30 DIAGNOSIS — I340 Nonrheumatic mitral (valve) insufficiency: Secondary | ICD-10-CM | POA: Diagnosis not present

## 2015-07-30 DIAGNOSIS — E1122 Type 2 diabetes mellitus with diabetic chronic kidney disease: Secondary | ICD-10-CM | POA: Diagnosis not present

## 2015-07-30 DIAGNOSIS — IMO0001 Reserved for inherently not codable concepts without codable children: Secondary | ICD-10-CM

## 2015-07-30 DIAGNOSIS — N182 Chronic kidney disease, stage 2 (mild): Secondary | ICD-10-CM

## 2015-07-30 HISTORY — DX: Type 2 diabetes mellitus without complications: E11.9

## 2015-07-30 HISTORY — DX: Unspecified atrial flutter: I48.92

## 2015-07-30 LAB — CBC
HCT: 38.8 % — ABNORMAL LOW (ref 39.0–52.0)
Hemoglobin: 13 g/dL (ref 13.0–17.0)
MCH: 29.5 pg (ref 26.0–34.0)
MCHC: 33.5 g/dL (ref 30.0–36.0)
MCV: 88 fL (ref 78.0–100.0)
PLATELETS: 414 10*3/uL — AB (ref 150–400)
RBC: 4.41 MIL/uL (ref 4.22–5.81)
RDW: 15 % (ref 11.5–15.5)
WBC: 11.1 10*3/uL — ABNORMAL HIGH (ref 4.0–10.5)

## 2015-07-30 LAB — COMPREHENSIVE METABOLIC PANEL
ALT: 51 U/L (ref 17–63)
AST: 36 U/L (ref 15–41)
Albumin: 3.1 g/dL — ABNORMAL LOW (ref 3.5–5.0)
Alkaline Phosphatase: 73 U/L (ref 38–126)
Anion gap: 9 (ref 5–15)
BUN: 70 mg/dL — ABNORMAL HIGH (ref 6–20)
CHLORIDE: 101 mmol/L (ref 101–111)
CO2: 23 mmol/L (ref 22–32)
Calcium: 8.8 mg/dL — ABNORMAL LOW (ref 8.9–10.3)
Creatinine, Ser: 2.85 mg/dL — ABNORMAL HIGH (ref 0.61–1.24)
GFR, EST AFRICAN AMERICAN: 30 mL/min — AB (ref >60)
GFR, EST NON AFRICAN AMERICAN: 26 mL/min — AB (ref >60)
Glucose, Bld: 212 mg/dL — ABNORMAL HIGH (ref 65–99)
POTASSIUM: 4.9 mmol/L (ref 3.5–5.1)
Sodium: 133 mmol/L — ABNORMAL LOW (ref 135–145)
TOTAL PROTEIN: 6.5 g/dL (ref 6.5–8.1)
Total Bilirubin: 0.6 mg/dL (ref 0.3–1.2)

## 2015-07-30 LAB — GLUCOSE, CAPILLARY: Glucose-Capillary: 193 mg/dL — ABNORMAL HIGH (ref 65–99)

## 2015-07-30 LAB — TROPONIN I
TROPONIN I: 0.23 ng/mL — AB (ref <0.031)
TROPONIN I: 0.23 ng/mL — AB (ref <0.031)

## 2015-07-30 LAB — TSH: TSH: 1.253 u[IU]/mL (ref 0.350–4.500)

## 2015-07-30 LAB — HEPARIN LEVEL (UNFRACTIONATED)

## 2015-07-30 LAB — URINALYSIS, ROUTINE W REFLEX MICROSCOPIC
BILIRUBIN URINE: NEGATIVE
GLUCOSE, UA: NEGATIVE mg/dL
Ketones, ur: NEGATIVE mg/dL
Leukocytes, UA: NEGATIVE
Nitrite: NEGATIVE
PROTEIN: 100 mg/dL — AB
SPECIFIC GRAVITY, URINE: 1.014 (ref 1.005–1.030)
pH: 5 (ref 5.0–8.0)

## 2015-07-30 LAB — URINE MICROSCOPIC-ADD ON

## 2015-07-30 LAB — MRSA PCR SCREENING: MRSA by PCR: NEGATIVE

## 2015-07-30 LAB — BRAIN NATRIURETIC PEPTIDE: B NATRIURETIC PEPTIDE 5: 309.3 pg/mL — AB (ref 0.0–100.0)

## 2015-07-30 LAB — PROTIME-INR
INR: 1.12 (ref 0.00–1.49)
PROTHROMBIN TIME: 14.6 s (ref 11.6–15.2)

## 2015-07-30 MED ORDER — WARFARIN SODIUM 5 MG PO TABS
10.0000 mg | ORAL_TABLET | Freq: Once | ORAL | Status: AC
  Administered 2015-07-30: 10 mg via ORAL
  Filled 2015-07-30: qty 2

## 2015-07-30 MED ORDER — FUROSEMIDE 10 MG/ML IJ SOLN: 40.0000 mg | Freq: Two times a day (BID) | INTRAMUSCULAR | Status: DC

## 2015-07-30 MED ORDER — CETYLPYRIDINIUM CHLORIDE 0.05 % MT LIQD
7.0000 mL | Freq: Two times a day (BID) | OROMUCOSAL | Status: DC
  Administered 2015-07-30 – 2015-07-31 (×2): 7 mL via OROMUCOSAL

## 2015-07-30 MED ORDER — SODIUM CHLORIDE 0.9% FLUSH
3.0000 mL | Freq: Two times a day (BID) | INTRAVENOUS | Status: DC
  Administered 2015-07-30 – 2015-08-07 (×14): 3 mL via INTRAVENOUS

## 2015-07-30 MED ORDER — INSULIN ASPART 100 UNIT/ML ~~LOC~~ SOLN
0.0000 [IU] | Freq: Three times a day (TID) | SUBCUTANEOUS | Status: DC
  Administered 2015-07-31: 7 [IU] via SUBCUTANEOUS
  Administered 2015-07-31: 4 [IU] via SUBCUTANEOUS
  Administered 2015-07-31 – 2015-08-01 (×2): 7 [IU] via SUBCUTANEOUS
  Administered 2015-08-01 – 2015-08-02 (×3): 4 [IU] via SUBCUTANEOUS
  Administered 2015-08-02: 3 [IU] via SUBCUTANEOUS
  Administered 2015-08-03: 4 [IU] via SUBCUTANEOUS
  Administered 2015-08-03: 7 [IU] via SUBCUTANEOUS
  Administered 2015-08-03: 3 [IU] via SUBCUTANEOUS
  Administered 2015-08-04 (×2): 4 [IU] via SUBCUTANEOUS
  Administered 2015-08-04: 7 [IU] via SUBCUTANEOUS
  Administered 2015-08-05: 4 [IU] via SUBCUTANEOUS
  Administered 2015-08-05: 7 [IU] via SUBCUTANEOUS
  Administered 2015-08-06: 11 [IU] via SUBCUTANEOUS
  Administered 2015-08-06: 3 [IU] via SUBCUTANEOUS
  Administered 2015-08-06: 4 [IU] via SUBCUTANEOUS
  Administered 2015-08-07: 7 [IU] via SUBCUTANEOUS
  Administered 2015-08-07: 3 [IU] via SUBCUTANEOUS
  Administered 2015-08-07 – 2015-08-08 (×2): 7 [IU] via SUBCUTANEOUS

## 2015-07-30 MED ORDER — WARFARIN - PHARMACIST DOSING INPATIENT: Freq: Every day | Status: DC

## 2015-07-30 MED ORDER — ASPIRIN 325 MG PO TABS
325.0000 mg | ORAL_TABLET | Freq: Every day | ORAL | Status: DC
  Administered 2015-07-30: 325 mg via ORAL
  Filled 2015-07-30: qty 1

## 2015-07-30 MED ORDER — FUROSEMIDE 10 MG/ML IJ SOLN: 40.0000 mg | Freq: Every day | INTRAMUSCULAR | Status: DC

## 2015-07-30 MED ORDER — FUROSEMIDE 10 MG/ML IJ SOLN
40.0000 mg | Freq: Two times a day (BID) | INTRAMUSCULAR | Status: DC
  Administered 2015-07-30: 40 mg via INTRAVENOUS
  Filled 2015-07-30: qty 4

## 2015-07-30 MED ORDER — INSULIN ASPART 100 UNIT/ML ~~LOC~~ SOLN
0.0000 [IU] | Freq: Every day | SUBCUTANEOUS | Status: DC
  Administered 2015-08-02: 2 [IU] via SUBCUTANEOUS
  Administered 2015-08-04: 3 [IU] via SUBCUTANEOUS
  Administered 2015-08-05 – 2015-08-06 (×2): 2 [IU] via SUBCUTANEOUS

## 2015-07-30 MED ORDER — COUMADIN BOOK
Freq: Once | Status: AC
  Administered 2015-07-31: 12:00:00
  Filled 2015-07-30 (×2): qty 1

## 2015-07-30 MED ORDER — ONDANSETRON HCL 4 MG PO TABS: 4.0000 mg | ORAL_TABLET | Freq: Four times a day (QID) | ORAL | Status: DC | PRN

## 2015-07-30 MED ORDER — ONDANSETRON HCL 4 MG/2ML IJ SOLN: 4.0000 mg | Freq: Four times a day (QID) | INTRAMUSCULAR | Status: DC | PRN

## 2015-07-30 MED ORDER — HEPARIN BOLUS VIA INFUSION
2000.0000 [IU] | Freq: Once | INTRAVENOUS | Status: AC
  Administered 2015-07-31: 2000 [IU] via INTRAVENOUS
  Filled 2015-07-30: qty 2000

## 2015-07-30 MED ORDER — HEPARIN BOLUS VIA INFUSION
4000.0000 [IU] | Freq: Once | INTRAVENOUS | Status: AC
  Administered 2015-07-30: 4000 [IU] via INTRAVENOUS
  Filled 2015-07-30: qty 4000

## 2015-07-30 MED ORDER — FUROSEMIDE 10 MG/ML IJ SOLN: 40.0000 mg | Freq: Once | INTRAMUSCULAR | Status: DC

## 2015-07-30 MED ORDER — DILTIAZEM HCL 100 MG IV SOLR
5.0000 mg/h | INTRAVENOUS | Status: DC
  Administered 2015-07-30: 5 mg/h via INTRAVENOUS
  Administered 2015-07-30: 15 mg/h via INTRAVENOUS
  Administered 2015-07-31: 5 mg/h via INTRAVENOUS
  Administered 2015-07-31: 10 mg/h via INTRAVENOUS
  Filled 2015-07-30 (×5): qty 100

## 2015-07-30 MED ORDER — CARVEDILOL 6.25 MG PO TABS
6.2500 mg | ORAL_TABLET | Freq: Two times a day (BID) | ORAL | Status: DC
  Administered 2015-07-30 – 2015-07-31 (×3): 6.25 mg via ORAL
  Filled 2015-07-30 (×3): qty 1

## 2015-07-30 MED ORDER — ASPIRIN EC 81 MG PO TBEC: 81.0000 mg | DELAYED_RELEASE_TABLET | Freq: Every day | ORAL | Status: DC

## 2015-07-30 MED ORDER — WARFARIN VIDEO
Freq: Once | Status: AC
  Administered 2015-07-31: 10:00:00

## 2015-07-30 MED ORDER — HEPARIN (PORCINE) IN NACL 100-0.45 UNIT/ML-% IJ SOLN
2000.0000 [IU]/h | INTRAMUSCULAR | Status: DC
  Administered 2015-07-30: 1550 [IU]/h via INTRAVENOUS
  Filled 2015-07-30 (×3): qty 250

## 2015-07-30 MED ORDER — DILTIAZEM LOAD VIA INFUSION
10.0000 mg | Freq: Once | INTRAVENOUS | Status: AC
  Administered 2015-07-30: 10 mg via INTRAVENOUS
  Filled 2015-07-30: qty 10

## 2015-07-30 MED ORDER — HEPARIN SODIUM (PORCINE) 5000 UNIT/ML IJ SOLN: 5000.0000 [IU] | Freq: Three times a day (TID) | INTRAMUSCULAR | Status: DC

## 2015-07-30 NOTE — Progress Notes (Signed)
Pt stated he was on Metformin and Glipizide at home he had been initially disgnosed with pre-diabetes and his last PCP visit informed him he was diabetic. No order set was ordered for glucose control. [facility]level paged for CBGs and Insulin/ hypoglycemic orders. CBG was 193.

## 2015-07-30 NOTE — Consult Note (Signed)
CARDIOLOGY CONSULT NOTE       Patient ID: John Bailey MRN: [id number] DOB/AGE: 1971/01/25 45 y.o.  Admit date: 07/30/2015 Referring Physician: Coralyn Pear  Primary Physician: Barbette Merino, MD Primary Cardiologist: Nahser  Reason for Consultation: Rapid Afib  Active Problems:   Hyponatremia   AKI (acute kidney injury) ([place])   Hyperkalemia   Chronic diastolic CHF (congestive heart failure) ([place])   Morbid obesity ([place])   Atrial fibrillation ([place])   HPI:  45 y.o. obese black male. Sedentary with poor diet. 2-3 weeks malaise, increased edema and dyspnea. Transferred from Dr [name] office for rapid afib and CHF.  History of HTN and ? Diastolic CHF.  EF 50-55% 2014 with normal myovue. Denies SSCP.  On admission Cr elevated at 2.85.  Non smoker with no diabetes.  Salt indiscretion with poor diet.  ECG with RBBB and rapid Flutter. Initially with no functional iv. IV team just inserted one in [place] II.  Denies history of murmur, thyroid disease, stimulants , cocaine or drugs  ROS All other systems reviewed and negative except as noted above  Past Medical History  Diagnosis Date  . Hypertension     Family History  Problem Relation Age of Onset  . Heart disease Mother   . Hyperlipidemia Father   . Hypertension Father     Social History   Social History  . Marital Status: Married    Spouse Name: N/A  . Number of Children: N/A  . Years of Education: N/A   Occupational History  . Not on file.   Social History Main Topics  . Smoking status: Never Smoker   . Smokeless tobacco: Never Used  . Alcohol Use: No  . Drug Use: No  . Sexual Activity: Yes   Other Topics Concern  . Not on file   Social History Narrative    Past Surgical History  Procedure Laterality Date  . Cholecystectomy       . aspirin  325 mg Oral Daily  . carvedilol  6.25 mg Oral BID WC  . diltiazem  10 mg Intravenous Once  . furosemide  40 mg Intravenous BID  . heparin  5,000 Units Subcutaneous Q8H  .  sodium chloride flush  3 mL Intravenous Q12H   . diltiazem (CARDIZEM) infusion 5 mg/hr (07/30/15 1513)    Physical Exam: Blood pressure 136/117, pulse 36, resp. rate 27, height 5\' 11"  (1.803 m), weight 149.1 kg (328 lb 11.3 oz), SpO2 95 %.    Affect appropriate Obese black male  HEENT: normal Neck supple with no adenopathy JVP normal no bruits no thyromegaly Lungs clear with no wheezing and good diaphragmatic motion Heart:  S1/S2 no murmur, no rub, gallop or click PMI normal Abdomen: benighn, BS positve, no tenderness, no AAA no bruit.  No HSM or HJR Distal pulses intact with no bruits Plus 3 edema Neuro non-focal Skin warm and dry No muscular weakness   Labs:   Lab Results  Component Value Date   WBC 11.1* 07/30/2015   HGB 13.0 07/30/2015   HCT 38.8* 07/30/2015   MCV 88.0 07/30/2015   PLT 414* 07/30/2015    Recent Labs Lab 07/30/15 1351  NA 133*  K 4.9  CL 101  CO2 23  BUN 70*  CREATININE 2.85*  CALCIUM 8.8*  PROT 6.5  BILITOT 0.6  ALKPHOS 73  ALT 51  AST 36  GLUCOSE 212*   Lab Results  Component Value Date   TROPONINI 0.23* 07/30/2015   No results found for: CHOL  No results found for: HDL No results found for: LDLCALC No results found for: TRIG No results found for: CHOLHDL No results found for: LDLDIRECT    Radiology: Dg Chest Port 1 View  07/30/2015  CLINICAL DATA:  Shortness of breath.  History of hypertension. EXAM: PORTABLE CHEST 1 VIEW COMPARISON:  12/29/2012 FINDINGS: Mild to moderate enlargement of the cardiopericardial silhouette, stable. No mediastinal or hilar masses or evidence of adenopathy. Clear lungs.  No convincing pleural effusion or pneumothorax. Skeletal structures are unremarkable. IMPRESSION: 1. No acute cardiopulmonary disease. 2. Mild to moderate cardiomegaly. Electronically Signed   By: Lajean Manes M.D.   On: 07/30/2015 14:07    EKG: Rapid atrial flutter 168  RBBB   ASSESSMENT AND PLAN:  Fib/Flutter:  Now that iv is  in start iv cardizem drip.  Oral coreg.  Echo in am once rate better controlled. Given likely CHF need to restore SR favor TEE/DCC on Thursday morning Have arranged for 8:00 am at [facility] Discussed needing carelink at [facility] 6:30 am Thursday am to be at [facility] by 7:00am.  Given A/CRF NOAC not good idea.  Start coumadin and heparin  This patients CHA2DS2-VASc Score and unadjusted Ischemic Stroke Rate (% per year) is equal to 2.2 % stroke rate/year from a score of 2  Above score calculated as 1 point each if present [CHF, HTN, DM, Vascular=MI/PAD/Aortic Plaque, Age if 65-74, or Male] Above score calculated as 2 points each if present [Age > 75, or Stroke/TIA/TE]  CHF:  Likely tachycardia mediated.  Suspect EF a lot worse than 2014.  Despite elevated creatinine would continue bid lasix.  Echo in am [facility] may need milrinone if EF down  HTN:  May be a candidate for hydralazine and nitrates.    A/CRF:  Needs 24 hour urine protein ? From HTN disease consider renal consult  Start with UA   Discussed diagnosis, care plan and TEE/DCC with patient and wife willing tor proceed   Signed: [name] 07/30/2015, 3:14 PM

## 2015-07-30 NOTE — Progress Notes (Addendum)
ANTICOAGULATION CONSULT NOTE - Initial Consult  Pharmacy Consult for heparin IV, warfarin Indication: atrial fibrillation  No Known Allergies  Patient Measurements: Height: 5\' 11"  (180.3 cm) Weight: (!) 328 lb 11.3 oz (149.1 kg) IBW/kg (Calculated) : 75.3 Heparin Dosing Weight: 110 kg  Vital Signs: BP: 136/117 mmHg (05/16 1428) Pulse Rate: 36 (05/16 1400)  Labs:  Recent Labs  07/30/15 1351  HGB 13.0  HCT 38.8*  PLT 414*  CREATININE 2.85*  TROPONINI 0.23*    Estimated Creatinine Clearance: 50.1 mL/min (by C-G formula based on Cr of 2.85).   Medical History: Past Medical History  Diagnosis Date  . Hypertension     Medications:  Prescriptions prior to admission  Medication Sig Dispense Refill Last Dose  . albuterol (PROVENTIL HFA;VENTOLIN HFA) 108 (90 BASE) MCG/ACT inhaler Inhale 1-2 puffs into the lungs every 6 (six) hours as needed for wheezing. 1 Inhaler 3 Past Week at Unknown time  . aspirin EC 81 MG tablet Take 81 mg by mouth daily.   2 weeks  . chlorpheniramine-HYDROcodone (TUSSIONEX PENNKINETIC ER) 10-8 MG/5ML SUER Take 5 mLs by mouth 2 (two) times daily as needed for cough.   7-8 days  . fluticasone (FLONASE) 50 MCG/ACT nasal spray Place 2 sprays into the nose daily. 16 g 2 07/29/2015 at Unknown time  . furosemide (LASIX) 40 MG tablet Take 40 mg by mouth 2 (two) times daily.   07/30/2015 at Unknown time  . glimepiride (AMARYL) 4 MG tablet Take 4 mg by mouth daily before breakfast.   07/30/2015 at Unknown time  . lisinopril-hydrochlorothiazide (PRINZIDE,ZESTORETIC) 20-25 MG per tablet Take 1 tablet by mouth daily. 90 tablet 3 07/30/2015 at Unknown time  . metFORMIN (GLUCOPHAGE) 500 MG tablet Take 500 mg by mouth daily with breakfast.   07/30/2015 at Unknown time  . Multiple Vitamin (MULTIVITAMIN WITH MINERALS) TABS tablet Take 2 tablets by mouth daily.   07/29/2015 at Unknown time  . potassium chloride SA (KLOR-CON M20) 20 MEQ tablet Take 1 tablet (20 mEq total) by  mouth daily. 90 tablet 3 07/30/2015 at Unknown time  . spironolactone (ALDACTONE) 25 MG tablet Take 1 tablet (25 mg total) by mouth daily. 31 tablet 11 3 days  . azithromycin (ZITHROMAX) 250 MG tablet Take 1 tablet (250 mg total) by mouth daily. 2 tabs po on day 1, 1 tab po on days 2-5 (Patient not taking: Reported on 07/30/2015) 6 tablet 0   . carvedilol (COREG) 25 MG tablet TAKE 1 TABLET (25 MG TOTAL) BY MOUTH 2 (TWO) TIMES DAILY. (Patient not taking: Reported on 07/30/2015) 30 tablet 0   . methylPREDNISolone (MEDROL DOSEPAK) 4 MG tablet follow package directions. Start 12/30/12. Take with food (Patient not taking: Reported on 07/30/2015) 21 tablet 0    Scheduled:  . aspirin  325 mg Oral Daily  . carvedilol  6.25 mg Oral BID WC  . [START ON 07/31/2015] coumadin book   Does not apply Once  . furosemide  40 mg Intravenous BID  . heparin  4,000 Units Intravenous Once  . sodium chloride flush  3 mL Intravenous Q12H  . warfarin  10 mg Oral ONCE-1800  . [START ON 07/31/2015] warfarin   Does not apply Once    Assessment: 45 yo obese male directly admitted from MD office for Afib with RVR and CHF.  PMH HTN, CHF (preserved EF as of 2014).  Seen by Cardiology who favors TEE/DCC and consulting pharmacy to dose warfarin and IV heparin   Baseline INR pending;  will defer baseline aPTT as already started on heparin  Prior anticoagulation: none  Significant events: 5/16: currently on IV cardizem infusion.  Heparin-diltiazem compatibility listed as variable in Micromedex, but incompatible only with high-concentration products.  WL product concentrations listed as compatible.  Today, 07/30/2015:  CBC: wnl  No bleeding or infusion issues per nursing  CrCl 50 ml/min  Goal of Therapy: Heparin level 0.3-0.7 units/ml Monitor platelets by anticoagulation protocol: Yes  Plan:  Warfarin 10 mg x 1 ordered by cardiology  Stop SQ heparin; no doses given  Spoke with Cardiology PA who will d/c ASA 325 for  now as no apparent Hx CAD; may add back low-dose ASA if new evidence of CAD.  Heparin 4000 units IV bolus x 1  Heparin 1550 units/hr IV infusion  Check heparin level 6 hrs after start  Daily CBC, INR, daily heparin level once stable  Monitor for signs of bleeding or thrombosis   Reuel Boom, PharmD Pager: [phone] 07/30/2015, 3:58 PM

## 2015-07-30 NOTE — H&P (Addendum)
History and Physical    John Bailey [id number] DOB: 1970-10-16 DOA: 07/30/2015  PCP: Barbette Merino, MD  Patient coming from: Dr [name] office  Chief Complaint: Generalized weakness  HPI: John Bailey is a 45 y.o. male with medical history significant of with a past medical history of morbid obesity, chronic diastolic congestive heart failure, last transthoracic echocardiogram performed on 07/27/2012 showed EF of 50-55% with diffuse hypokinesis, grade 4 diastolic dysfunction, who has followed Dr. Stephens November in the office, history of diabetes and hypertension, presenting as a transfer from his primary care physician's office. He had reported having generalized weakness becoming progressively worse over the past several weeks. He denies chest pain however reports exertional shortness of breath. He also complains of increasing bilateral extremity pitting edema. He presented as a direct admit from Dr [name] office where he was found to be in A. fib with RVR having ventricular rates in the 160s. An EKG revealed right bundle branch block however EKG reporting STEMI. This was immediately reviewed with cardiology who felt this likely represented right bundle branch block with rapid A. Fib, rather than STEMI. Patient was transferred to the stepdown unit.   Review of Systems: As per HPI otherwise 10 point review of systems negative.    Past Medical History  Diagnosis Date  . Hypertension     Past Surgical History  Procedure Laterality Date  . Cholecystectomy       reports that he has never smoked. He has never used smokeless tobacco. He reports that he does not drink alcohol or use illicit drugs.  No Known Allergies  Family History  Problem Relation Age of Onset  . Heart disease Mother   . Hyperlipidemia Father   . Hypertension Father     Prior to Admission medications   Medication Sig Start Date End Date Taking? Authorizing Provider  albuterol (PROVENTIL HFA;VENTOLIN HFA) 108 (90  BASE) MCG/ACT inhaler Inhale 1-2 puffs into the lungs every 6 (six) hours as needed for wheezing. 05/30/14  Yes Thayer Headings, MD  aspirin EC 81 MG tablet Take 81 mg by mouth daily.   Yes Historical Provider, MD  chlorpheniramine-HYDROcodone (TUSSIONEX PENNKINETIC ER) 10-8 MG/5ML SUER Take 5 mLs by mouth 2 (two) times daily as needed for cough.   Yes Historical Provider, MD  fluticasone (FLONASE) 50 MCG/ACT nasal spray Place 2 sprays into the nose daily. 12/29/12  Yes Janne Napoleon, NP  furosemide (LASIX) 40 MG tablet Take 40 mg by mouth 2 (two) times daily.   Yes Historical Provider, MD  glimepiride (AMARYL) 4 MG tablet Take 4 mg by mouth daily before breakfast.   Yes Historical Provider, MD  lisinopril-hydrochlorothiazide (PRINZIDE,ZESTORETIC) 20-25 MG per tablet Take 1 tablet by mouth daily. 05/30/14  Yes Thayer Headings, MD  metFORMIN (GLUCOPHAGE) 500 MG tablet Take 500 mg by mouth daily with breakfast.   Yes Historical Provider, MD  Multiple Vitamin (MULTIVITAMIN WITH MINERALS) TABS tablet Take 2 tablets by mouth daily.   Yes Historical Provider, MD  potassium chloride SA (KLOR-CON M20) 20 MEQ tablet Take 1 tablet (20 mEq total) by mouth daily. 05/30/14  Yes Thayer Headings, MD  spironolactone (ALDACTONE) 25 MG tablet Take 1 tablet (25 mg total) by mouth daily. 05/30/14  Yes Thayer Headings, MD  azithromycin (ZITHROMAX) 250 MG tablet Take 1 tablet (250 mg total) by mouth daily. 2 tabs po on day 1, 1 tab po on days 2-5 Patient not taking: Reported on 07/30/2015 12/29/12   Janne Napoleon, NP  carvedilol (COREG) 25 MG tablet TAKE 1 TABLET (25 MG TOTAL) BY MOUTH 2 (TWO) TIMES DAILY. Patient not taking: Reported on 07/30/2015 02/26/14   Thayer Headings, MD  methylPREDNISolone (MEDROL DOSEPAK) 4 MG tablet follow package directions. Start 12/30/12. Take with food Patient not taking: Reported on 07/30/2015 12/29/12   Janne Napoleon, NP    Physical Exam: There were no vitals filed for this  visit.    Constitutional: Appears to be in distress, anxious, tearful. There were no vitals filed for this visit. Eyes: PERRL, lids and conjunctivae normal ENMT: Mucous membranes are moist. Posterior pharynx clear of any exudate or lesions.Normal dentition.  Neck: normal, supple, no masses, no thyromegaly Respiratory: His lungs appear to be clear to auscultation bilaterally, no wheezing, rhonchi rales Cardiovascular: Tachycardic, irregular rate and rhythm normal S1-S2, has 3+ bilateral extremity pitting edema Abdomen: no tenderness, no masses palpated. No hepatosplenomegaly. Bowel sounds positive.  Musculoskeletal: no clubbing / cyanosis. No joint deformity upper and lower extremities. Good ROM, no contractures. Normal muscle tone.  Skin: no rashes, lesions, ulcers. No induration Neurologic: CN 2-12 grossly intact. Sensation intact, DTR normal. Strength 5/5 in all 4.  Psychiatric: Normal judgment and insight. Alert and oriented x 3. Normal mood.    Labs on Admission: I have personally reviewed following labs and imaging studies  CBC: No results for input(s): WBC, NEUTROABS, HGB, HCT, MCV, PLT in the last 168 hours. Basic Metabolic Panel: No results for input(s): NA, K, CL, CO2, GLUCOSE, BUN, CREATININE, CALCIUM, MG, PHOS in the last 168 hours. GFR: CrCl cannot be calculated (Unknown ideal weight.). Liver Function Tests: No results for input(s): AST, ALT, ALKPHOS, BILITOT, PROT, ALBUMIN in the last 168 hours. No results for input(s): LIPASE, AMYLASE in the last 168 hours. No results for input(s): AMMONIA in the last 168 hours. Coagulation Profile: No results for input(s): INR, PROTIME in the last 168 hours. Cardiac Enzymes: No results for input(s): CKTOTAL, CKMB, CKMBINDEX, TROPONINI in the last 168 hours. BNP (last 3 results) No results for input(s): PROBNP in the last 8760 hours. HbA1C: No results for input(s): HGBA1C in the last 72 hours. CBG: No results for input(s): GLUCAP  in the last 168 hours. Lipid Profile: No results for input(s): CHOL, HDL, LDLCALC, TRIG, CHOLHDL, LDLDIRECT in the last 72 hours. Thyroid Function Tests: No results for input(s): TSH, T4TOTAL, FREET4, T3FREE, THYROIDAB in the last 72 hours. Anemia Panel: No results for input(s): VITAMINB12, FOLATE, FERRITIN, TIBC, IRON, RETICCTPCT in the last 72 hours. Urine analysis: No results found for: COLORURINE, APPEARANCEUR, LABSPEC, PHURINE, GLUCOSEU, HGBUR, BILIRUBINUR, KETONESUR, PROTEINUR, UROBILINOGEN, NITRITE, LEUKOCYTESUR Sepsis Labs: !!!!!!!!!!!!!!!!!!!!!!!!!!!!!!!!!!!!!!!!!!!! @LABRCNTIP (procalcitonin:4,lacticidven:4) )No results found for this or any previous visit (from the past 240 hour(s)).   Radiological Exams on Admission: No results found.  EKG: Independently reviewed.   Assessment/Plan Active Problems:   Hyponatremia   AKI (acute kidney injury) ([facility] [facility])   Hyperkalemia   Chronic diastolic CHF (congestive heart failure) (H[facility])   Morbid obesity ([place])   Atrial fibrillation ([place])  1.  Atrial fibrillation with rapid ventricular response. Mr Harlow Ohms having a history of chronic diastolic congestive heart failure, having progressive generalized weakness over the past several weeks, presented as a direct admission from his primary care physician's office. On presentation he was found to be in atrial fibrillation with rapid ventricular response having ventricular rates in the 160s. He denies having a history of A. Fib nor is there mention of this in medical records. Will place him on a Cardizem drip providing  10 mg bolus. Further workup with transthoracic echocardiogram. Check TSH. Requested cardiology to review EKG which appears to have right bundle branch block with A. fib with RVR. Cardiology did not feel this represented STEMI. Await further recommendations from cardiology.  2.  Acute on chronic diastolic congestive heart failure. He reports having increasing bilateral extremity pitting edema  over the past several weeks. Lab work at his primary physician's office showing hyponatremia. Plan to check a BNP, transthoracic echocardiogram, follow input/output and daily weights. Will treat with Lasix 40 mg IV daily. Await further recommendations from cardiology.   3.  Acute renal failure. Patient's primary care physician reporting increased creatinine. Plan to repeat BMP now. Acute renal failure may be related to congestion and decreased output. Follow-up on repeat BMP.  4.  Hyponatremia. His PCP reported that office lab work showed hypernatremia. Repeat labs now.   5.  Hypertension. He will be started on a Cardizem drip. Hold oral antihypertensive agents for now.  6.  Hyperkalemia. His primary care provider reporting elevated potassium. He had been on spironolactone and potassium replacement. These agents will be discontinued, checking a metabolic panel now.  7.  Diabetes mellitus. Plan to hold glimepiride due to acute renal failure reported by his primary care provider. Place him on sliding scale coverage   DVT prophylaxis: Subcutaneous heparin  Code Status: Full code  Family Communication: I spoke to family at bedside  Disposition Plan: Anticipate he will require greater than 2 nights hospitalization  Consults called: Cardiology  Admission status: Admit to the step down unit   Kelvin Cellar MD [facility] Pager [phone]  If 7PM-7AM, please contact night-coverage [email or web address] Password [email or web address]  07/30/2015, 2:08 PM

## 2015-07-30 NOTE — Progress Notes (Signed)
Pts HR in 180s, MD Zamora notified, EKG done, pt transferred to ICU.  Rosine Beat, RN

## 2015-07-30 NOTE — Progress Notes (Signed)
ANTICOAGULATION CONSULT NOTE - Follow Up Consult  Pharmacy Consult for Heparin Indication: atrial fibrillation  No Known Allergies  Patient Measurements: Height: 5\' 11"  (180.3 cm) Weight: (!) 328 lb 11.3 oz (149.1 kg) IBW/kg (Calculated) : 75.3 Heparin Dosing Weight:   Vital Signs: Temp: 97.7 F (36.5 C) (05/16 2000) Temp Source: Oral (05/16 2000) BP: 108/94 mmHg (05/16 2300) Pulse Rate: 75 (05/16 2300)  Labs:  Recent Labs  07/30/15 1351 07/30/15 1927 07/30/15 2244  HGB 13.0  --   --   HCT 38.8*  --   --   PLT 414*  --   --   LABPROT  --  14.6  --   INR  --  1.12  --   HEPARINUNFRC  --   --  <0.10*  CREATININE 2.85*  --   --   TROPONINI 0.23* 0.23*  --     Estimated Creatinine Clearance: 50.1 mL/min (by C-G formula based on Cr of 2.85).   Medications:  Infusions:  . diltiazem (CARDIZEM) infusion 15 mg/hr (07/30/15 2138)  . heparin 1,550 Units/hr (07/30/15 1800)    Assessment: Patient with low heparin level.  No heparin issues per RN.  Goal of Therapy:  Heparin level 0.3-0.7 units/ml Monitor platelets by anticoagulation protocol: Yes   Plan:  Heparin bolus 2000 units iv x1 Increase heparin drip to 2000 units/hr Next heparin level at [place], Shea Stakes [place] 07/30/2015,11:50 PM

## 2015-07-31 ENCOUNTER — Inpatient Hospital Stay (HOSPITAL_COMMUNITY): Payer: BLUE CROSS/BLUE SHIELD

## 2015-07-31 DIAGNOSIS — R9431 Abnormal electrocardiogram [ECG] [EKG]: Secondary | ICD-10-CM

## 2015-07-31 LAB — GLUCOSE, CAPILLARY
GLUCOSE-CAPILLARY: 172 mg/dL — AB (ref 65–99)
Glucose-Capillary: 240 mg/dL — ABNORMAL HIGH (ref 65–99)
Glucose-Capillary: 216 mg/dL — ABNORMAL HIGH (ref 65–99)
Glucose-Capillary: 200 mg/dL — ABNORMAL HIGH (ref 65–99)

## 2015-07-31 LAB — CBC
HCT: 35.7 % — ABNORMAL LOW (ref 39.0–52.0)
HEMOGLOBIN: 12.1 g/dL — AB (ref 13.0–17.0)
MCH: 30.2 pg (ref 26.0–34.0)
MCHC: 33.9 g/dL (ref 30.0–36.0)
MCV: 89 fL (ref 78.0–100.0)
PLATELETS: 357 10*3/uL (ref 150–400)
RBC: 4.01 MIL/uL — ABNORMAL LOW (ref 4.22–5.81)
RDW: 15 % (ref 11.5–15.5)
WBC: 9.6 10*3/uL (ref 4.0–10.5)

## 2015-07-31 LAB — BASIC METABOLIC PANEL
Anion gap: 7 (ref 5–15)
BUN: 74 mg/dL — ABNORMAL HIGH (ref 6–20)
CALCIUM: 8.2 mg/dL — AB (ref 8.9–10.3)
CO2: 23 mmol/L (ref 22–32)
CREATININE: 3.1 mg/dL — AB (ref 0.61–1.24)
Chloride: 101 mmol/L (ref 101–111)
GFR calc Af Amer: 27 mL/min — ABNORMAL LOW (ref >60)
GFR calc non Af Amer: 23 mL/min — ABNORMAL LOW (ref >60)
GLUCOSE: 193 mg/dL — AB (ref 65–99)
Potassium: 4.8 mmol/L (ref 3.5–5.1)
Sodium: 131 mmol/L — ABNORMAL LOW (ref 135–145)

## 2015-07-31 LAB — ECHOCARDIOGRAM COMPLETE
Height: 71 in
Weight: 5259.29 oz

## 2015-07-31 LAB — HEPARIN LEVEL (UNFRACTIONATED): HEPARIN UNFRACTIONATED: 0.94 [IU]/mL — AB (ref 0.30–0.70)

## 2015-07-31 LAB — PROTIME-INR
INR: 1.06 (ref 0.00–1.49)
PROTHROMBIN TIME: 14 s (ref 11.6–15.2)

## 2015-07-31 LAB — TROPONIN I: Troponin I: 0.22 ng/mL — ABNORMAL HIGH (ref <0.031)

## 2015-07-31 MED ORDER — WARFARIN SODIUM 5 MG PO TABS
10.0000 mg | ORAL_TABLET | Freq: Once | ORAL | Status: AC
  Administered 2015-07-31: 10 mg via ORAL
  Filled 2015-07-31: qty 2

## 2015-07-31 MED ORDER — PERFLUTREN LIPID MICROSPHERE
INTRAVENOUS | Status: AC
  Filled 2015-07-31: qty 10

## 2015-07-31 MED ORDER — FUROSEMIDE 10 MG/ML IJ SOLN
40.0000 mg | Freq: Three times a day (TID) | INTRAMUSCULAR | Status: DC
  Administered 2015-07-31 – 2015-08-01 (×3): 40 mg via INTRAVENOUS
  Filled 2015-07-31 (×4): qty 4

## 2015-07-31 MED ORDER — HEPARIN BOLUS VIA INFUSION
3000.0000 [IU] | Freq: Once | INTRAVENOUS | Status: AC
  Administered 2015-07-31: 3000 [IU] via INTRAVENOUS
  Filled 2015-07-31: qty 3000

## 2015-07-31 MED ORDER — HEPARIN (PORCINE) IN NACL 100-0.45 UNIT/ML-% IJ SOLN
2350.0000 [IU]/h | INTRAMUSCULAR | Status: DC
  Filled 2015-07-31 (×2): qty 250

## 2015-07-31 MED ORDER — HEPARIN (PORCINE) IN NACL 100-0.45 UNIT/ML-% IJ SOLN
2100.0000 [IU]/h | INTRAMUSCULAR | Status: DC
  Filled 2015-07-31 (×2): qty 250

## 2015-07-31 NOTE — Progress Notes (Signed)
PROGRESS NOTE    John Bailey  [id number] DOB: 20-Aug-1970 DOA: 07/30/2015 PCP: Barbette Merino, MD  Brief Narrative: John Bailey is a 45 y.o. male with medical history significant of with a past medical history of morbid obesity, chronic diastolic congestive heart failure  followed Dr. Stephens November, diabetes and hypertension, presented with generalized weakness becoming progressively worse over the past several weeks. He denies chest pain however reports exertional shortness of breath. He also complains of increasing bilateral extremity pitting edema, he was found to be in A. fib with RVR having ventricular rates in the 160s  Assessment & Plan: 1. Atrial fibrillation with rapid ventricular response.  -likely precipitated by excess volume -on Cardizem gtt, HR better now -continue IV heparin and warfarin, no DOAC due to AKI -FU ECHO, TSH -plan for DCCV in am  2. 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 Hyponatremia -due to excess volume, improving  5. Hypertension.  -improved, continue Cardizem, hold ACE and  6. Diabetes mellitus.  -hold glimepiride due to acute renal failure , continue SSI -FU hba1c  DVT prophylaxis: IV heparin  Code Status: Full code  Family Communication: none at bedside  Disposition Plan: keep in SDU  Consultants:  Cards  Procedures: ECHo  Subjective: Feels a little better, breathing ok  Objective: Filed Vitals:   07/31/15 0800 07/31/15 0815 07/31/15 0900 07/31/15 1000  BP:  154/103 118/88 136/55  Pulse:  108  94  Temp: 97.4 F  (36.3 C)     TempSrc: Oral     Resp:  20 21 23   Height:      Weight:      SpO2:  100% 95% 97%    Intake/Output Summary (Last 24 hours) at 07/31/15 1108 Last data filed at 07/31/15 1000  Gross per 24 hour  Intake 998.65 ml  Output    420 ml  Net 578.65 ml   Filed Weights   07/30/15 1400  Weight: 149.1 kg (328 lb 11.3 oz)    Examination:  General exam: Appears calm and comfortable , no distress Respiratory system: Clear to auscultation. Respiratory effort normal. Cardiovascular system: S1 & S2 heard, Irregular. No JVD, murmurs, rubs, gallops or clicks Gastrointestinal system: Abdomen is nondistended, soft and nontender. No organomegaly or masses felt. Normal bowel sounds heard. Abd wall edema note Central nervous system: Alert and oriented. No focal neurological deficits. Extremities: 3plus edema Skin: No rashes, lesions or ulcers Psychiatry: Judgement and insight appear normal. Mood & affect appropriate.     Data Reviewed: I have personally reviewed following labs and imaging studies  CBC:  Recent Labs Lab 07/30/15 1351 07/31/15 0150  WBC 11.1* 9.6  HGB 13.0 12.1*  HCT 38.8* 35.7*  MCV 88.0 89.0  PLT 414* [id number]   Basic Metabolic Panel:  Recent Labs Lab 07/30/15 1351 07/31/15 0150  NA 133* 131*  K 4.9 4.8  CL 101 101  CO2 23 23  GLUCOSE 212* 193*  BUN 70* 74*  CREATININE 2.85* 3.10*  CALCIUM 8.8* 8.2*   GFR: Estimated Creatinine Clearance: 46 mL/min (by C-G formula based on Cr of 3.1). Liver Function Tests:  Recent  Labs Lab 07/30/15 1351  AST 36  ALT 51  ALKPHOS 73  BILITOT 0.6  PROT 6.5  ALBUMIN 3.1*   No results for input(s): LIPASE, AMYLASE in the last 168 hours. No results for input(s): AMMONIA in the last 168 hours. Coagulation Profile:  Recent Labs Lab 07/30/15 1927 07/31/15 0150  INR 1.12 1.06   Cardiac Enzymes:  Recent Labs Lab 07/30/15 1351 07/30/15 1927 07/31/15 0150  TROPONINI 0.23* 0.23* 0.22*   BNP (last 3  results) No results for input(s): PROBNP in the last 8760 hours. HbA1C: No results for input(s): HGBA1C in the last 72 hours. CBG:  Recent Labs Lab 07/30/15 2119 07/31/15 0731  GLUCAP 193* 172*   Lipid Profile: No results for input(s): CHOL, HDL, LDLCALC, TRIG, CHOLHDL, LDLDIRECT in the last 72 hours. Thyroid Function Tests:  Recent Labs  07/30/15 1927  TSH 1.253   Anemia Panel: No results for input(s): VITAMINB12, FOLATE, FERRITIN, TIBC, IRON, RETICCTPCT in the last 72 hours. Urine analysis:    Component Value Date/Time   COLORURINE YELLOW 07/30/2015 1621   APPEARANCEUR CLEAR 07/30/2015 1621   LABSPEC 1.014 07/30/2015 1621   PHURINE 5.0 07/30/2015 1621   GLUCOSEU NEGATIVE 07/30/2015 1621   HGBUR TRACE* 07/30/2015 1621   [place] 07/30/2015 1621   KETONESUR NEGATIVE 07/30/2015 1621   PROTEINUR 100* 07/30/2015 1621   NITRITE NEGATIVE 07/30/2015 1621   LEUKOCYTESUR NEGATIVE 07/30/2015 1621   Sepsis Labs: @LABRCNTIP (procalcitonin:4,lacticidven:4)  ) Recent Results (from the past 240 hour(s))  MRSA PCR Screening     Status: None   Collection Time: 07/30/15  2:30 PM  Result Value Ref Range Status   MRSA by PCR NEGATIVE NEGATIVE Final    Comment:        The GeneXpert MRSA Assay (FDA approved for NASAL specimens only), is one component of a comprehensive MRSA colonization surveillance program. It is not intended to diagnose MRSA infection nor to guide or monitor treatment for MRSA infections.          Radiology Studies: Dg Chest Port 1 View  07/30/2015  CLINICAL DATA:  Shortness of breath.  History of hypertension. EXAM: PORTABLE CHEST 1 VIEW COMPARISON:  12/29/2012 FINDINGS: Mild to moderate enlargement of the cardiopericardial silhouette, stable. No mediastinal or hilar masses or evidence of adenopathy. Clear lungs.  No convincing pleural effusion or pneumothorax. Skeletal structures are unremarkable. IMPRESSION: 1. No acute cardiopulmonary  disease. 2. Mild to moderate cardiomegaly. Electronically Signed   By: Lajean Manes M.D.   On: 07/30/2015 14:07        Scheduled Meds: . antiseptic oral rinse  7 mL Mouth Rinse BID  . carvedilol  6.25 mg Oral BID WC  . coumadin book   Does not apply Once  . furosemide  40 mg Intravenous Q8H  . insulin aspart  0-20 Units Subcutaneous TID WC  . insulin aspart  0-5 Units Subcutaneous QHS  . sodium chloride flush  3 mL Intravenous Q12H  . warfarin  10 mg Oral ONCE-1800  . warfarin   Does not apply Once  . Warfarin - Pharmacist Dosing Inpatient   Does not apply q1800   Continuous Infusions: . diltiazem (CARDIZEM) infusion 5 mg/hr (07/31/15 1000)  . heparin 2,350 Units/hr (07/31/15 1000)     LOS: 1 day    Time spent: 63min    Domenic Polite, MD [facility] Pager [phone]  If 7PM-7AM, please contact night-coverage [email or web address] Password [email or web address] 07/31/2015, 11:08 AM

## 2015-07-31 NOTE — Progress Notes (Signed)
ANTICOAGULATION CONSULT NOTE - Follow Up  Pharmacy Consult for heparin IV, warfarin Indication: atrial fibrillation  No Known Allergies  Patient Measurements: Height: 5\' 11"  (180.3 cm) Weight: (!) 328 lb 11.3 oz (149.1 kg) IBW/kg (Calculated) : 75.3 Heparin Dosing Weight: 110 kg  Vital Signs: Temp: 97.9 F (36.6 C) (05/17 0400) Temp Source: Oral (05/17 0400) BP: 126/71 mmHg (05/17 0700) Pulse Rate: 87 (05/17 0700)  Labs:  Recent Labs  07/30/15 1351 07/30/15 1927 07/30/15 2244 07/31/15 0150 07/31/15 0550  HGB 13.0  --   --  12.1*  --   HCT 38.8*  --   --  35.7*  --   PLT 414*  --   --  357  --   LABPROT  --  14.6  --  14.0  --   INR  --  1.12  --  1.06  --   HEPARINUNFRC  --   --  <0.10*  --  <0.10*  CREATININE 2.85*  --   --  3.10*  --   TROPONINI 0.23* 0.23*  --  0.22*  --     Estimated Creatinine Clearance: 46 mL/min (by C-G formula based on Cr of 3.1).  Assessment: 45 yo obese male directly admitted from MD office for Afib with RVR and CHF.  PMH HTN, CHF (preserved EF as of 2014).  Seen by Cardiology who favors TEE/DCC and consulting pharmacy to dose warfarin and IV heparin. Not on any prior anticoagulation.  Per discussion with Cardiology PA on 5/16, will d/c ASA 325 for now as no apparent Hx CAD; cards may add back low-dose ASA if new evidence of CAD.  Significant events: 5/16: currently on IV cardizem infusion.  Heparin-diltiazem compatibility listed as variable in [employer], but incompatible only with high-concentration products.  WL product concentrations listed as compatible.  Today, 07/31/2015:  Heparin level remains undetectable this morning despite increasing rate late night to 2000 units/hr  INR 1.06  CBC ok No bleeding/complications reported.  Goal of Therapy: INR 2-3 Heparin level 0.3-0.7 units/ml Monitor platelets by anticoagulation protocol: Yes  Plan:  Warfarin 10 mg x 1 today  Heparin 3000 units IV bolus x 1  Increase heparin  infusion to 2350 units/hr   Check heparin level 6 hrs after rate increase  Daily CBC, INR, daily heparin level.  Monitor for signs of bleeding or thrombosis  Hershal Coria, PharmD, BCPS Pager: [phone] 07/31/2015 7:37 AM

## 2015-07-31 NOTE — Progress Notes (Signed)
Patient voids frequent but small amounts of clear yellow urine, bladder scanned patient and only 14 mL shown. Patient sates he does not feel full or like he is not emptying his bladder. Will continue to monitor.

## 2015-07-31 NOTE — Discharge Instructions (Signed)

## 2015-07-31 NOTE — Progress Notes (Signed)
ANTICOAGULATION CONSULT NOTE - BRIEF NOTE (heparin level follow-up)  Pharmacy Consult for heparin  Indication: atrial fibrillation  Heparin Dosing Weight: 110 kg  Assessment: 45 yo obese male directly admitted from MD office for Afib with RVR and CHF. PMH HTN, CHF (preserved EF as of 2014). Seen by Cardiology who favors TEE/DCC and consulting pharmacy to dose warfarin and IV heparin. Not on any prior anticoagulation. Per discussion with Cardiology PA on 5/16, will d/c ASA 325 for now as no apparent Hx CAD; cards may add back low-dose ASA if new evidence of CAD.  Today, 07/31/2015:  Heparin level SUPRAtherapeutic this afternoon (=0.94) on 2350 units/hr  RN notes that previous IV line was infiltrated and was not likely getting full dose of heparin, leading to lower heparin levels.  New line was place.  No bleeding noted  Goal of Therapy: INR 2-3 Heparin level 0.3-0.7 units/ml Monitor platelets by anticoagulation protocol: Yes  Plan:  Decrease heparin to 2100 units/hr   Recheck heparin level in ~8hr  Warfarin as ordered  Doreene Eland, PharmD, BCPS.   Pager: [phone] 07/31/2015 4:57 PM

## 2015-07-31 NOTE — Progress Notes (Signed)
  Echocardiogram 2D Echocardiogram with Definity 58mL has been performed.  Darlina Sicilian M 07/31/2015, 9:47 AM

## 2015-07-31 NOTE — Progress Notes (Signed)
Patient ID: John Bailey, male   DOB: 20-May-1970, 45 y.o.   MRN: [id number]    Subjective:  Feels better with lower HR no much urination   Objective:  Filed Vitals:   07/31/15 0400 07/31/15 0500 07/31/15 0600 07/31/15 0700  BP: 119/81 100/78 127/95 126/71  Pulse: 102 82 97 87  Temp: 97.9 F (36.6 C)     TempSrc: Oral     Resp: 22 26 29 25   Height:      Weight:      SpO2: 99% 89% 100% 97%    Intake/Output from previous day:  Intake/Output Summary (Last 24 hours) at 07/31/15 0740 Last data filed at 07/31/15 0600  Gross per 24 hour  Intake 894.42 ml  Output    420 ml  Net 474.42 ml    Physical Exam: Affect appropriate Obese black male  HEENT: normal Neck supple with no adenopathy JVP normal no bruits no thyromegaly Lungs clear with no wheezing and good diaphragmatic motion Heart:  S1/S2 no murmur, no rub, gallop or click PMI normal Abdomen: benighn, BS positve, no tenderness, no AAA no bruit.  No HSM or HJR Distal pulses intact with no bruits Plus 3  edema Neuro non-focal Skin warm and dry No muscular weakness   Lab Results: Basic Metabolic Panel:  Recent Labs  07/30/15 1351 07/31/15 0150  NA 133* 131*  K 4.9 4.8  CL 101 101  CO2 23 23  GLUCOSE 212* 193*  BUN 70* 74*  CREATININE 2.85* 3.10*  CALCIUM 8.8* 8.2*   Liver Function Tests:  Recent Labs  07/30/15 1351  AST 36  ALT 51  ALKPHOS 73  BILITOT 0.6  PROT 6.5  ALBUMIN 3.1*   CBC:  Recent Labs  07/30/15 1351 07/31/15 0150  WBC 11.1* 9.6  HGB 13.0 12.1*  HCT 38.8* 35.7*  MCV 88.0 89.0  PLT 414* 357   Cardiac Enzymes:  Recent Labs  07/30/15 1351 07/30/15 1927 07/31/15 0150  TROPONINI 0.23* 0.23* 0.22*   Thyroid Function Tests:  Recent Labs  07/30/15 1927  TSH 1.253    Imaging: Dg Chest Port 1 View  07/30/2015  CLINICAL DATA:  Shortness of breath.  History of hypertension. EXAM: PORTABLE CHEST 1 VIEW COMPARISON:  12/29/2012 FINDINGS: Mild to moderate enlargement of  the cardiopericardial silhouette, stable. No mediastinal or hilar masses or evidence of adenopathy. Clear lungs.  No convincing pleural effusion or pneumothorax. Skeletal structures are unremarkable. IMPRESSION: 1. No acute cardiopulmonary disease. 2. Mild to moderate cardiomegaly. Electronically Signed   By: Lajean Manes M.D.   On: 07/30/2015 14:07    Cardiac Studies:  ECG:  Rapid atrial fibrillation/flutter RBBB   Telemetry: flutter better rate control  Echo: EF 50-55% restrictive filling moderate LAE   Medications:   . antiseptic oral rinse  7 mL Mouth Rinse BID  . carvedilol  6.25 mg Oral BID WC  . coumadin book   Does not apply Once  . furosemide  40 mg Intravenous Q8H  . heparin  3,000 Units Intravenous Once  . insulin aspart  0-20 Units Subcutaneous TID WC  . insulin aspart  0-5 Units Subcutaneous QHS  . sodium chloride flush  3 mL Intravenous Q12H  . warfarin  10 mg Oral ONCE-1800  . warfarin   Does not apply Once  . Warfarin - Pharmacist Dosing Inpatient   Does not apply q1800     . diltiazem (CARDIZEM) infusion 4 mg/hr (07/31/15 0413)  . heparin  Assessment/Plan:  Fib/Flutter:  Better rate control . On coumadin and heparin.  Don not stop heparin on transport to [facility] in am for TEE/DCC LA moderately dilated.  Day 2 coumadin No NOAC due to renal failure  CHF:  More diastolic and rate related EF 50-55% but restrictive filling on diastolic inflow. Not clear if there is a component Of nephrotic syndrome increase lasix 80 tid.   RF:  Would ask renal to get involed for further diagnostic purposes   HTN:  Improved will adjust oral meds after Jet 07/31/2015, 7:40 AM

## 2015-07-31 NOTE — Care Management Note (Signed)
Case Management Note  Patient Details  Name: John Bailey MRN: [id number] Date of Birth: [birth year]  Subjective/Objective:         Abnormal ecg-=stemi versus a.fib           Action/Plan:Date:  Jul 31, 2015 Chart reviewed for concurrent status and case management needs. Will continue to follow patient for changes and needs: Expected discharge date: [id number] Velva Harman, BSN, [place], [place]   Expected Discharge Date:   (UNKNOWN)               Expected Discharge Plan:  Home/Self Care  In-House Referral:  NA  Discharge planning Services  CM Consult  Post Acute Care Choice:  NA Choice offered to:  NA  DME Arranged:  N/A DME Agency:  NA  HH Arranged:  NA HH Agency:  NA  Status of Service:  In process, will continue to follow  Medicare Important Message Given:    Date Medicare IM Given:    Medicare IM give by:    Date Additional Medicare IM Given:    Additional Medicare Important Message give by:     If discussed at [place] of Stay Meetings, dates discussed:    Additional Comments:  Leeroy Cha, RN 07/31/2015, 9:30 AM

## 2015-08-01 ENCOUNTER — Inpatient Hospital Stay (HOSPITAL_COMMUNITY): Payer: BLUE CROSS/BLUE SHIELD | Admitting: Certified Registered"

## 2015-08-01 ENCOUNTER — Encounter (HOSPITAL_COMMUNITY): Payer: Self-pay | Admitting: *Deleted

## 2015-08-01 ENCOUNTER — Inpatient Hospital Stay (HOSPITAL_COMMUNITY): Payer: BLUE CROSS/BLUE SHIELD

## 2015-08-01 ENCOUNTER — Encounter (HOSPITAL_COMMUNITY)
Admission: AD | Disposition: A | Discharge status: Home / Self Care | Payer: Self-pay | Source: Ambulatory Visit | Attending: Internal Medicine

## 2015-08-01 DIAGNOSIS — I4891 Unspecified atrial fibrillation: Secondary | ICD-10-CM

## 2015-08-01 DIAGNOSIS — I340 Nonrheumatic mitral (valve) insufficiency: Secondary | ICD-10-CM

## 2015-08-01 HISTORY — PX: TEE WITHOUT CARDIOVERSION: SHX5443

## 2015-08-01 HISTORY — PX: CARDIOVERSION: SHX1299

## 2015-08-01 LAB — GLUCOSE, CAPILLARY
Glucose-Capillary: 169 mg/dL — ABNORMAL HIGH (ref 65–99)
Glucose-Capillary: 222 mg/dL — ABNORMAL HIGH (ref 65–99)
Glucose-Capillary: 167 mg/dL — ABNORMAL HIGH (ref 65–99)
Glucose-Capillary: 181 mg/dL — ABNORMAL HIGH (ref 65–99)

## 2015-08-01 LAB — CBC
HCT: 37.3 % — ABNORMAL LOW (ref 39.0–52.0)
Hemoglobin: 12.4 g/dL — ABNORMAL LOW (ref 13.0–17.0)
MCH: 29.6 pg (ref 26.0–34.0)
MCHC: 33.2 g/dL (ref 30.0–36.0)
MCV: 89 fL (ref 78.0–100.0)
PLATELETS: 392 10*3/uL (ref 150–400)
RBC: 4.19 MIL/uL — AB (ref 4.22–5.81)
RDW: 15.1 % (ref 11.5–15.5)
WBC: 8.8 10*3/uL (ref 4.0–10.5)

## 2015-08-01 LAB — BASIC METABOLIC PANEL
Anion gap: 8 (ref 5–15)
BUN: 76 mg/dL — AB (ref 6–20)
CALCIUM: 8.2 mg/dL — AB (ref 8.9–10.3)
CO2: 23 mmol/L (ref 22–32)
CREATININE: 2.91 mg/dL — AB (ref 0.61–1.24)
Chloride: 99 mmol/L — ABNORMAL LOW (ref 101–111)
GFR calc non Af Amer: 25 mL/min — ABNORMAL LOW (ref >60)
GFR, EST AFRICAN AMERICAN: 29 mL/min — AB (ref >60)
GLUCOSE: 173 mg/dL — AB (ref 65–99)
Potassium: 4.8 mmol/L (ref 3.5–5.1)
Sodium: 130 mmol/L — ABNORMAL LOW (ref 135–145)

## 2015-08-01 LAB — HEMOGLOBIN A1C
HEMOGLOBIN A1C: 9.7 % — AB (ref 4.8–5.6)
Mean Plasma Glucose: 232 mg/dL

## 2015-08-01 LAB — PROTIME-INR
INR: 1.15 (ref 0.00–1.49)
PROTHROMBIN TIME: 14.9 s (ref 11.6–15.2)

## 2015-08-01 LAB — HEPARIN LEVEL (UNFRACTIONATED)
HEPARIN UNFRACTIONATED: 0.96 [IU]/mL — AB (ref 0.30–0.70)
Heparin Unfractionated: 1.03 IU/mL — ABNORMAL HIGH (ref 0.30–0.70)
Heparin Unfractionated: 0.91 IU/mL — ABNORMAL HIGH (ref 0.30–0.70)

## 2015-08-01 SURGERY — ECHOCARDIOGRAM, TRANSESOPHAGEAL
Anesthesia: Monitor Anesthesia Care

## 2015-08-01 MED ORDER — FUROSEMIDE 10 MG/ML IJ SOLN
120.0000 mg | Freq: Three times a day (TID) | INTRAVENOUS | Status: DC
  Administered 2015-08-01 – 2015-08-06 (×14): 120 mg via INTRAVENOUS
  Filled 2015-08-01 (×2): qty 10
  Filled 2015-08-01 (×2): qty 12
  Filled 2015-08-01: qty 10
  Filled 2015-08-01: qty 12
  Filled 2015-08-01: qty 2
  Filled 2015-08-01: qty 10
  Filled 2015-08-01: qty 12
  Filled 2015-08-01 (×4): qty 10
  Filled 2015-08-01 (×3): qty 12
  Filled 2015-08-01: qty 10
  Filled 2015-08-01: qty 12

## 2015-08-01 MED ORDER — SODIUM CHLORIDE 0.9% FLUSH: 3.0000 mL | INTRAVENOUS | Status: DC | PRN

## 2015-08-01 MED ORDER — SODIUM CHLORIDE 0.9% FLUSH: 3.0000 mL | Freq: Two times a day (BID) | INTRAVENOUS | Status: DC

## 2015-08-01 MED ORDER — SODIUM CHLORIDE 0.45 % IV SOLN: 100.0000 mL/h | INTRAVENOUS | Status: DC

## 2015-08-01 MED ORDER — HEPARIN (PORCINE) IN NACL 100-0.45 UNIT/ML-% IJ SOLN
1700.0000 [IU]/h | INTRAMUSCULAR | Status: DC
  Administered 2015-08-01: 1700 [IU]/h via INTRAVENOUS
  Filled 2015-08-01: qty 250

## 2015-08-01 MED ORDER — SODIUM CHLORIDE 0.9 % IV SOLN
INTRAVENOUS | Status: DC
  Administered 2015-08-01: 500 mL via INTRAVENOUS

## 2015-08-01 MED ORDER — WARFARIN SODIUM 5 MG PO TABS
15.0000 mg | ORAL_TABLET | Freq: Once | ORAL | Status: AC
  Administered 2015-08-01: 15 mg via ORAL
  Filled 2015-08-01: qty 3

## 2015-08-01 MED ORDER — BUTAMBEN-TETRACAINE-BENZOCAINE 2-2-14 % EX AERO
INHALATION_SPRAY | CUTANEOUS | Status: DC | PRN
  Administered 2015-08-01: 2 via TOPICAL

## 2015-08-01 MED ORDER — HEPARIN (PORCINE) IN NACL 100-0.45 UNIT/ML-% IJ SOLN
1450.0000 [IU]/h | INTRAMUSCULAR | Status: DC
  Administered 2015-08-01: 1000 [IU]/h via INTRAVENOUS
  Administered 2015-08-02: 1300 [IU]/h via INTRAVENOUS
  Administered 2015-08-04: 1450 [IU]/h via INTRAVENOUS
  Filled 2015-08-01 (×4): qty 250

## 2015-08-01 MED ORDER — HYDRALAZINE HCL 25 MG PO TABS: 25.0000 mg | ORAL_TABLET | Freq: Three times a day (TID) | ORAL | Status: DC

## 2015-08-01 MED ORDER — HEPARIN (PORCINE) IN NACL 100-0.45 UNIT/ML-% IJ SOLN
1450.0000 [IU]/h | INTRAMUSCULAR | Status: DC
  Filled 2015-08-01: qty 250

## 2015-08-01 MED ORDER — PROPOFOL 500 MG/50ML IV EMUL
INTRAVENOUS | Status: DC | PRN
  Administered 2015-08-01: 100 ug/kg/min via INTRAVENOUS

## 2015-08-01 MED ORDER — SODIUM CHLORIDE 0.9 % IV SOLN: 250.0000 mL | INTRAVENOUS | Status: DC

## 2015-08-01 NOTE — Progress Notes (Signed)
ANTICOAGULATION CONSULT NOTE - Follow Up  Pharmacy Consult for heparin IV, warfarin Indication: atrial fibrillation  No Known Allergies  Patient Measurements: Height: 5\' 11"  (180.3 cm) Weight: (!) 325 lb (147.419 kg) IBW/kg (Calculated) : 75.3 Heparin Dosing Weight: 110 kg  Vital Signs: Temp: 97.8 F (36.6 C) (05/18 1500) Temp Source: Axillary (05/18 1500) BP: 121/99 mmHg (05/18 1219) Pulse Rate: 83 (05/18 1219)  Labs:  Recent Labs  07/30/15 1351 07/30/15 1927  07/31/15 0150  08/01/15 0037 08/01/15 1042 08/01/15 1806  HGB 13.0  --   --  12.1*  --  12.4*  --   --   HCT 38.8*  --   --  35.7*  --  37.3*  --   --   PLT 414*  --   --  357  --  392  --   --   LABPROT  --  14.6  --  14.0  --  14.9  --   --   INR  --  1.12  --  1.06  --  1.15  --   --   HEPARINUNFRC  --   --   < >  --   < > 1.03* 0.96* 0.91*  CREATININE 2.85*  --   --  3.10*  --  2.91*  --   --   TROPONINI 0.23* 0.23*  --  0.22*  --   --   --   --   < > = values in this interval not displayed.  Estimated Creatinine Clearance: 48.7 mL/min (by C-G formula based on Cr of 2.91).  Assessment: 45 yo obese male directly admitted from MD office for Afib with RVR and CHF.  PMH HTN, CHF (preserved EF as of 2014).  Seen by Cardiology who favors TEE/DCC and consulting pharmacy to dose warfarin and IV heparin. Not on any prior anticoagulation.  Per discussion with Cardiology PA on 5/16, will d/c ASA 325 for now as no apparent Hx CAD; cards may add back low-dose ASA if new evidence of CAD.  Significant events: 5/17 heparin level was undetectable in the AM and increased to supratherapeutic levels in the afternoon following an appropriate rate adjustment.  Per RN, the previous IV line was infiltrated and patient was not likely getting the full dose of heparin, leading to the lower heparin levels.  New line was placed and level increased significantly. 5/17 Warfarin education completed 5/18 s/p DCCV maintaining NSR  Today,  08/01/2015:  Heparin level 10:42 trended down slightly but remains supratherapeutic at 0.96 despite rate decrease to 1700 units/hr  Heparin level 18:06 unchanged at 0.91 units/ml, note that previous IV site infiltrated with arm swelling, likely has a "depot injection" of Heparin in SQ tissues that is keeping level elevated  INR 1.15  CBC: Hgb low/stable, Plts WNL No bleeding/complications reported  Goal of Therapy: INR 2-3 Heparin level 0.3-0.7 units/ml Monitor platelets by anticoagulation protocol: Yes  Plan:  Warfarin 15 mg x 1 today  Decrease heparin infusion further to 1000 units/hr.  Per cardiology, will continue heparin until INR 2.2 or above.  Check heparin level 8 hours after rate adjustment.  Daily CBC, INR, daily heparin level.  Monitor for signs of bleeding or thrombosis  Minda Ditto PharmD Pager [phone] 08/01/2015, 7:34 PM

## 2015-08-01 NOTE — Progress Notes (Signed)
PROGRESS NOTE    John Bailey  [id number] DOB: [birth year] DOA: 07/30/2015 PCP: Barbette Merino, MD  Brief Narrative: John Bailey is a 45 y.o. male with medical history significant of with a past medical history of morbid obesity, chronic diastolic congestive heart failure  followed Dr. Stephens November, diabetes and hypertension, presented with generalized weakness becoming progressively worse over the past several weeks. He denies chest pain however reports exertional shortness of breath. He also complains of increasing bilateral extremity pitting edema, he was found to be in A. fib with RVR having ventricular rates in the 160s. Admitted, started on diuretics, IV heparin/warfarin, seen by Cards, s/p DCCV 5/18, Renal consulted, increased lasix dose  Assessment & Plan: 1. Atrial fibrillation with rapid ventricular response.  -likely precipitated by excess volume -just cardioverted to NSR now -continue IV heparin and warfarin, no DOAC due to AKI -ECHO with EF of 35% and grade 3 diastolic dysfunction  2. 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 Hyponatremia -due to excess volume, improving  5. Hypertension.  -improved, hold Cardizem, hold ACE   6. Diabetes mellitus.  -hold glimepiride due to acute renal failure , continue SSI -hba1c is 9.7  DVT prophylaxis: IV heparin  Code Status: Full code  Family Communication: wife at bedside  Disposition Plan:Tx to tele  Consultants:   Cards  Procedures: ECHo  Subjective: Feels a little better, breathing ok  Objective: Filed Vitals:   08/01/15 0833 08/01/15 0855 08/01/15 0910 08/01/15 0915  BP: 101/66 83/47 88/71  101/82  Pulse: 74 39    Temp: 97.6 F (36.4 C)     TempSrc: Oral Oral    Resp: 27 26 25 24   Height:      Weight:      SpO2: 100% 88%      Intake/Output Summary (Last 24 hours) at 08/01/15 1113 Last data filed at 08/01/15 1100  Gross per 24 hour  Intake 763.24 ml  Output   1365 ml  Net -601.76 ml   Filed Weights   07/30/15 1400 08/01/15 0715  Weight: 149.1 kg (328 lb 11.3 oz) 147.419 kg (325 lb)    Examination:  General exam: Appears calm and comfortable , no distress Respiratory system: Clear to auscultation. Respiratory effort normal. Cardiovascular system: S1 & S2 heard, Irregular. No JVD, murmurs, rubs, gallops or clicks Gastrointestinal system: Abdomen is nondistended, soft and nontender. No organomegaly or masses felt. Normal bowel sounds heard. Abd wall edema noted Central nervous system: Alert and oriented. No focal neurological deficits. Extremities: 3plus edema Skin: No rashes, lesions or ulcers Psychiatry: Judgement and insight appear normal. Mood & affect appropriate.     Data Reviewed: I have personally reviewed following labs and imaging studies  CBC:  Recent Labs Lab 07/30/15 1351 07/31/15 0150 08/01/15 0037  WBC 11.1* 9.6 8.8  HGB 13.0 12.1* 12.4*  HCT 38.8* 35.7* 37.3*  MCV 88.0 89.0 89.0  PLT 414* 357 0000000   Basic Metabolic Panel:  Recent Labs Lab 07/30/15 1351 07/31/15 0150 08/01/15 0037  NA 133* 131* 130*  K 4.9 4.8 4.8  CL 101  101 99*  CO2 23 23 23   GLUCOSE 212* 193* 173*  BUN 70* 74* 76*  CREATININE 2.85* 3.10* 2.91*  CALCIUM 8.8* 8.2* 8.2*   GFR: Estimated Creatinine Clearance: 48.7 mL/min (by C-G formula based on Cr of 2.91). Liver Function Tests:  Recent Labs Lab 07/30/15 1351  AST 36  ALT 51  ALKPHOS 73  BILITOT 0.6  PROT 6.5   ALBUMIN 3.1*   No results for input(s): LIPASE, AMYLASE in the last 168 hours. No results for input(s): AMMONIA in the last 168 hours. Coagulation Profile:  Recent Labs Lab 07/30/15 1927 07/31/15 0150 08/01/15 0037  INR 1.12 1.06 1.15   Cardiac Enzymes:  Recent Labs Lab 07/30/15 1351 07/30/15 1927 07/31/15 0150  TROPONINI 0.23* 0.23* 0.22*   BNP (last 3 results) No results for input(s): PROBNP in the last 8760 hours. HbA1C:  Recent Labs  07/31/15 0150  HGBA1C 9.7*   CBG:  Recent Labs Lab 07/31/15 0731 07/31/15 1202 07/31/15 1617 07/31/15 2128 08/01/15 0749  GLUCAP 172* 240* 216* 200* 169*   Lipid Profile: No results for input(s): CHOL, HDL, LDLCALC, TRIG, CHOLHDL, LDLDIRECT in the last 72 hours. Thyroid Function Tests:  Recent Labs  07/30/15 1927  TSH 1.253   Anemia Panel: No results for input(s): VITAMINB12, FOLATE, FERRITIN, TIBC, IRON, RETICCTPCT in the last 72 hours. Urine analysis:    Component Value Date/Time   COLORURINE YELLOW 07/30/2015 1621   APPEARANCEUR CLEAR 07/30/2015 1621   LABSPEC 1.014 07/30/2015 1621   PHURINE 5.0 07/30/2015 1621   GLUCOSEU NEGATIVE 07/30/2015 1621   HGBUR TRACE* 07/30/2015 1621   [place] 07/30/2015 1621   KETONESUR NEGATIVE 07/30/2015 1621   PROTEINUR 100* 07/30/2015 1621   NITRITE NEGATIVE 07/30/2015 1621   LEUKOCYTESUR NEGATIVE 07/30/2015 1621   Sepsis Labs: @LABRCNTIP (procalcitonin:4,lacticidven:4)  ) Recent Results (from the past 240 hour(s))  MRSA PCR Screening     Status: None   Collection Time: 07/30/15  2:30 PM  Result Value Ref Range Status   MRSA by PCR NEGATIVE NEGATIVE Final    Comment:        The GeneXpert MRSA Assay (FDA approved for NASAL specimens only), is one component of a comprehensive MRSA colonization surveillance program. It is not intended to diagnose MRSA infection nor to guide or monitor treatment for MRSA infections.          Radiology  Studies: US Renal  07/31/2015  CLINICAL DATA:  Acute renal insufficiency EXAM: RENAL ULTRASOUND COMPARISON:  None. FINDINGS: Right Kidney: Length: 12.5 cm. Echogenicity and renal cortical thickness are within normal limits. No mass, perinephric fluid, or hydronephrosis visualized. No sonographically demonstrable calculus or ureterectasis. Left Kidney: Length: 12.0 cm. Echogenicity and renal cortical thickness are within normal limits. No perinephric fluid or hydronephrosis visualized. There is a cyst arising from the upper pole left kidney, measuring 2.3 x 1.9 x 1.7 cm. No sonographically demonstrable calculus or ureterectasis. Bladder: Empty and cannot be assessed at this time. IMPRESSION: Small cyst upper pole left kidney.  Study otherwise unremarkable. Electronically Signed   By: Lowella Grip III M.D.   On: 07/31/2015 13:25   Dg Chest Port 1 View  07/30/2015  CLINICAL DATA:  Shortness of breath.  History of hypertension. EXAM: PORTABLE CHEST 1 VIEW COMPARISON:  12/29/2012 FINDINGS: Mild to moderate enlargement of the cardiopericardial silhouette, stable. No mediastinal or hilar masses or evidence of adenopathy. Clear lungs.  No convincing pleural effusion or pneumothorax. Skeletal structures are unremarkable. IMPRESSION: 1. No acute cardiopulmonary disease.  2. Mild to moderate cardiomegaly. Electronically Signed   By: Lajean Manes M.D.   On: 07/30/2015 14:07        Scheduled Meds: . furosemide  120 mg Intravenous Q8H  . insulin aspart  0-20 Units Subcutaneous TID WC  . insulin aspart  0-5 Units Subcutaneous QHS  . sodium chloride flush  3 mL Intravenous Q12H  . warfarin  15 mg Oral ONCE-1800  . Warfarin - Pharmacist Dosing Inpatient   Does not apply q1800   Continuous Infusions: . heparin 1,700 Units/hr (08/01/15 0600)     LOS: 2 days    Time spent: 69min    Domenic Polite, MD [facility] Pager [phone]  If 7PM-7AM, please contact  night-coverage [email or web address] Password [email or web address] 08/01/2015, 11:13 AM

## 2015-08-01 NOTE — Progress Notes (Signed)
ANTICOAGULATION CONSULT NOTE - BRIEF NOTE (heparin level follow-up)  Pharmacy Consult for heparin  Indication: atrial fibrillation  Heparin Dosing Weight: 110 kg  Assessment: 45 yo obese male directly admitted from MD office for Afib with RVR and CHF. PMH HTN, CHF (preserved EF as of 2014). Seen by Cardiology who favors TEE/DCC and consulting pharmacy to dose warfarin and IV heparin. Not on any prior anticoagulation. Per discussion with Cardiology PA on 5/16, will d/c ASA 325 for now as no apparent Hx CAD; cards may add back low-dose ASA if new evidence of CAD.  07/31/2015:  Heparin level SUPRAtherapeutic this afternoon (=0.94) on 2350 units/hr  RN notes that previous IV line was infiltrated and was not likely getting full dose of heparin, leading to lower heparin levels.  New line was place.  No bleeding noted 07/31/2016:  0037 HL=1.03, still trending up, no infusion issues or bleeding per RN Goal of Therapy: INR 2-3 Heparin level 0.3-0.7 units/ml Monitor platelets by anticoagulation protocol: Yes  Plan:  Decrease heparin to 1700 units/hr   Recheck heparin level in ~8hr   Dorrene German 08/01/2015 1:43 AM

## 2015-08-01 NOTE — Consult Note (Signed)
Renal Service Consult Note [place] 08/01/2015 Sol Blazing Requesting Physician:  Dr Broadus John  Reason for Consult:   HPI: The patient is a 45 y.o. year-old with long hx of HTN, hx diast HF presenting with increasing gen'd weakness, leg edema, DOE.  In PCP's office was in afib with RVR, admitted to [facility].   Admitted and rx with IV diltiazem and IV lasix.  Creat 2.8 on admission, 3.0 yest and 2.9 today.  Old creat's from 2014 and 2016 were 1.0- 1.2.    History per patient is of gen weakness, leg swelling and SOB w exertion for several weeks.  Since admission, last night started to feel better and started urinating more.  Hx HTN was on Hyzaar, now on lisinopril and lasix.  Coreg dc'd by PCP at some time.  No tob / etoh.  Married with 2 children, ages 75 and 79.  LIves in [place], does Engineer, civil (consulting) work.  Grew in up in [place].  6 siblings. Father is bilat amputee on dialysis, was on HD here now is in [place] temporarily living w pt's sister for insurance benefits.  In pt's spare time he likes to play music/ drums, be with his family.    ROS  denies CP  no joint pain   no HA  no blurry vision  no rash  no diarrhea  no nausea/ vomiting  no dysuria  no difficulty voiding  no change in urine color    Past Medical History  Past Medical History  Diagnosis Date  . Hypertension   . Diabetes [facility])    Past Surgical History  Past Surgical History  Procedure Laterality Date  . Cholecystectomy     Family History  Family History  Problem Relation Age of Onset  . Heart disease Mother   . Hyperlipidemia Father   . Hypertension Father    Social History  reports that he has never smoked. He has never used smokeless tobacco. He reports that he does not drink alcohol or use illicit drugs. Allergies No Known Allergies Home medications Prior to Admission medications   Medication Sig Start Date End Date Taking? Authorizing Provider  albuterol (PROVENTIL  HFA;VENTOLIN HFA) 108 (90 BASE) MCG/ACT inhaler Inhale 1-2 puffs into the lungs every 6 (six) hours as needed for wheezing. 05/30/14  Yes Thayer Headings, MD  aspirin EC 81 MG tablet Take 81 mg by mouth daily.   Yes Historical Provider, MD  chlorpheniramine-HYDROcodone (TUSSIONEX PENNKINETIC ER) 10-8 MG/5ML SUER Take 5 mLs by mouth 2 (two) times daily as needed for cough.   Yes Historical Provider, MD  fluticasone (FLONASE) 50 MCG/ACT nasal spray Place 2 sprays into the nose daily. 12/29/12  Yes Janne Napoleon, NP  furosemide (LASIX) 40 MG tablet Take 40 mg by mouth 2 (two) times daily.   Yes Historical Provider, MD  glimepiride (AMARYL) 4 MG tablet Take 4 mg by mouth daily before breakfast.   Yes Historical Provider, MD  lisinopril-hydrochlorothiazide (PRINZIDE,ZESTORETIC) 20-25 MG per tablet Take 1 tablet by mouth daily. 05/30/14  Yes Thayer Headings, MD  metFORMIN (GLUCOPHAGE) 500 MG tablet Take 500 mg by mouth daily with breakfast.   Yes Historical Provider, MD  Multiple Vitamin (MULTIVITAMIN WITH MINERALS) TABS tablet Take 2 tablets by mouth daily.   Yes Historical Provider, MD  potassium chloride SA (KLOR-CON M20) 20 MEQ tablet Take 1 tablet (20 mEq total) by mouth daily. 05/30/14  Yes Thayer Headings, MD  spironolactone (ALDACTONE) 25 MG  tablet Take 1 tablet (25 mg total) by mouth daily. 05/30/14  Yes Thayer Headings, MD  azithromycin (ZITHROMAX) 250 MG tablet Take 1 tablet (250 mg total) by mouth daily. 2 tabs po on day 1, 1 tab po on days 2-5 Patient not taking: Reported on 07/30/2015 12/29/12   Janne Napoleon, NP  carvedilol (COREG) 25 MG tablet TAKE 1 TABLET (25 MG TOTAL) BY MOUTH 2 (TWO) TIMES DAILY. Patient not taking: Reported on 07/30/2015 02/26/14   Thayer Headings, MD  methylPREDNISolone (MEDROL DOSEPAK) 4 MG tablet follow package directions. Start 12/30/12. Take with food Patient not taking: Reported on 07/30/2015 12/29/12   Janne Napoleon, NP   Liver Function Tests  Recent Labs Lab  07/30/15 1351  AST 36  ALT 51  ALKPHOS 73  BILITOT 0.6  PROT 6.5  ALBUMIN 3.1*   No results for input(s): LIPASE, AMYLASE in the last 168 hours. CBC  Recent Labs Lab 07/30/15 1351 07/31/15 0150 08/01/15 0037  WBC 11.1* 9.6 8.8  HGB 13.0 12.1* 12.4*  HCT 38.8* 35.7* 37.3*  MCV 88.0 89.0 89.0  PLT 414* 357 808   Basic Metabolic Panel  Recent Labs Lab 07/30/15 1351 07/31/15 0150 08/01/15 0037  NA 133* 131* 130*  K 4.9 4.8 4.8  CL 101 101 99*  CO2 '23 23 23  ' GLUCOSE 212* 193* 173*  BUN 70* 74* 76*  CREATININE 2.85* 3.10* 2.91*  CALCIUM 8.8* 8.2* 8.2*    Filed Vitals:   08/01/15 0833 08/01/15 0855 08/01/15 0910 08/01/15 0915  BP: 1'01/66 83/47 88/71 ' 101/82  Pulse: 74 39    Temp: 97.6 F (36.4 C)     TempSrc: Oral Oral    Resp: '27 26 25 24  ' Height:      Weight:      SpO2: 100% 88%     Exam Obese, no distress No rash, cyanosis or gangrene Sclera anicteric, throat clear  No jvd or bruits Chest clear bilat RRR no MRG Abd soft ntnd no mass or ascites +bs markedly obese GU normal male defer MS no joint effusions or deformity Ext 3+ bilat LE edema mostly below the knees / no wounds or ulcers Neuro is alert, Ox 3 , nf  Date   Creat  eGFR Mar 2014 1.24 May 2014 1.28 Jul 30, 2015 2.85 May 17  3.10 May 18  2.91  29  UA - negative CXR - clear Renal US - normal size/ echo, no hydro ECHO - LVEF 35-40%, G3DD, severe conc LVH, severe LAE, RV fxn reduced, PAP mild ^'d  Assessment: 1. Renal failure - prob has renal failure from HTN'sive disease exacerbated by decomp CHF.  BP's are too low, plan dc coreg and hydralazine for now. Plan diuresis as BP tolerates.  No pulm edema, fluid is all R -sided. Has known RV dysfunction and LV dysfunction by echo.  2. Bivent CHF / severe LVH and G4DD / EF 35-40% this admission 3. Long-standing HTN 4. OBesity 5. AFib with RVR - in NSR now, on IV hep only now   Plan - dc BP meds, let BP come up.  Increase lasix.    Kelly Splinter MD [employer] pager [phone]    cell [phone] 08/01/2015, 9:52 AM

## 2015-08-01 NOTE — Clinical Documentation Improvement (Signed)
Cardiology  Can the diagnosis of Atrial Fibrillation be further specified? Please document response in next progress note. Thank you.   Chronic Atrial fibrillation  Paroxysmal Atrial fibrillation  Permanent Atrial fibrillation  Persistent Atrial fibrillation  Other  Clinically Undetermined  Document any associated diagnoses/conditions.  Supporting Information:  Was on Cardizem drip; TEE/DCC performed  Please exercise your independent, professional judgment when responding. A specific answer is not anticipated or expected.  Thank You,  Zoila Shutter RN, BSN, [place] [phone]; Cell: [phone]

## 2015-08-01 NOTE — Progress Notes (Signed)
Patient ID: John Bailey, male   DOB: [birth year], 45 y.o.   MRN: [id number]    Subjective:  Post DCC urinating well no chest pain  Objective:  Filed Vitals:   08/01/15 0833 08/01/15 0855 08/01/15 0910 08/01/15 0915  BP: 101/66 83/47 88/71  101/82  Pulse: 74 39    Temp: 97.6 F (36.4 C)     TempSrc: Oral Oral    Resp: 27 26 25 24   Height:      Weight:      SpO2: 100% 88%      Intake/Output from previous day:  Intake/Output Summary (Last 24 hours) at 08/01/15 1036 Last data filed at 08/01/15 0827  Gross per 24 hour  Intake 698.72 ml  Output   1365 ml  Net -666.28 ml    Physical Exam: Affect appropriate Obese black male  HEENT: normal Neck supple with no adenopathy JVP normal no bruits no thyromegaly Lungs clear with no wheezing and good diaphragmatic motion Heart:  S1/S2 no murmur, no rub, gallop or click PMI normal Abdomen: benighn, BS positve, no tenderness, no AAA no bruit.  No HSM or HJR Distal pulses intact with no bruits Plus 3  edema Neuro non-focal Skin warm and dry No muscular weakness   Lab Results: Basic Metabolic Panel:  Recent Labs  07/31/15 0150 08/01/15 0037  NA 131* 130*  K 4.8 4.8  CL 101 99*  CO2 23 23  GLUCOSE 193* 173*  BUN 74* 76*  CREATININE 3.10* 2.91*  CALCIUM 8.2* 8.2*   Liver Function Tests:  Recent Labs  07/30/15 1351  AST 36  ALT 51  ALKPHOS 73  BILITOT 0.6  PROT 6.5  ALBUMIN 3.1*   CBC:  Recent Labs  07/31/15 0150 08/01/15 0037  WBC 9.6 8.8  HGB 12.1* 12.4*  HCT 35.7* 37.3*  MCV 89.0 89.0  PLT 357 392   Cardiac Enzymes:  Recent Labs  07/30/15 1351 07/30/15 1927 07/31/15 0150  TROPONINI 0.23* 0.23* 0.22*   Thyroid Function Tests:  Recent Labs  07/30/15 1927  TSH 1.253    Imaging: US Renal  07/31/2015  CLINICAL DATA:  Acute renal insufficiency EXAM: RENAL ULTRASOUND COMPARISON:  None. FINDINGS: Right Kidney: Length: 12.5 cm. Echogenicity and renal cortical thickness are within normal  limits. No mass, perinephric fluid, or hydronephrosis visualized. No sonographically demonstrable calculus or ureterectasis. Left Kidney: Length: 12.0 cm. Echogenicity and renal cortical thickness are within normal limits. No perinephric fluid or hydronephrosis visualized. There is a cyst arising from the upper pole left kidney, measuring 2.3 x 1.9 x 1.7 cm. No sonographically demonstrable calculus or ureterectasis. Bladder: Empty and cannot be assessed at this time. IMPRESSION: Small cyst upper pole left kidney.  Study otherwise unremarkable. Electronically Signed   By: Lowella Grip III M.D.   On: 07/31/2015 13:25   Dg Chest Port 1 View  07/30/2015  CLINICAL DATA:  Shortness of breath.  History of hypertension. EXAM: PORTABLE CHEST 1 VIEW COMPARISON:  12/29/2012 FINDINGS: Mild to moderate enlargement of the cardiopericardial silhouette, stable. No mediastinal or hilar masses or evidence of adenopathy. Clear lungs.  No convincing pleural effusion or pneumothorax. Skeletal structures are unremarkable. IMPRESSION: 1. No acute cardiopulmonary disease. 2. Mild to moderate cardiomegaly. Electronically Signed   By: Lajean Manes M.D.   On: 07/30/2015 14:07    Cardiac Studies:  ECG:  Rapid atrial fibrillation/flutter RBBB   Telemetry:  NSR rate 88 08/01/2015   Echo: EF 50-55% restrictive filling moderate LAE   Medications:   .  carvedilol  6.25 mg Oral BID WC  . furosemide  40 mg Intravenous Q8H  . hydrALAZINE  25 mg Oral Q8H  . insulin aspart  0-20 Units Subcutaneous TID WC  . insulin aspart  0-5 Units Subcutaneous QHS  . sodium chloride flush  3 mL Intravenous Q12H  . Warfarin - Pharmacist Dosing Inpatient   Does not apply q1800     . heparin 1,700 Units/hr (08/01/15 0600)    Assessment/Plan:  Fib/Flutter: post DCC maintaining NSR continue heparin until INR 2.2 or above coreg for HTN/rate  CHF:  More diastolic and rate related EF 50-55% but restrictive filling on diastolic inflow. Not  clear if there is a component Of nephrotic syndrome continue  lasix 80 tid.   RF: Cr 2.91  Renal has seen note not complete protein in UA ? Renal US and 24 hr collection   HTN:  Improved add hydralazine d/c cardizem post [place] 08/01/2015, 10:36 AM

## 2015-08-01 NOTE — Interval H&P Note (Signed)
History and Physical Interval Note:  08/01/2015 6:55 AM  John Bailey  has presented today for surgery, with the diagnosis of a fib  The various methods of treatment have been discussed with the patient and family. After consideration of risks, benefits and other options for treatment, the patient has consented to  Procedure(s): TRANSESOPHAGEAL ECHOCARDIOGRAM (TEE) (N/A) CARDIOVERSION (N/A) as a surgical intervention .  The patient's history has been reviewed, patient examined, no change in status, stable for surgery.  I have reviewed the patient's chart and labs.  Questions were answered to the patient's satisfaction.     [name]

## 2015-08-01 NOTE — Progress Notes (Signed)
Telemetry monitor showed a 6 beat run of V-Tach at 0227 this morning. Patient now back in atrial fibrillation. Will continue to monitor. S.Zaineb Nowaczyk, RN

## 2015-08-01 NOTE — Anesthesia Preprocedure Evaluation (Addendum)
Anesthesia Evaluation  Patient identified by MRN, date of birth, ID band Patient awake    Reviewed: Allergy & Precautions, NPO status , Patient's Chart, lab work & pertinent test results  History of Anesthesia Complications Negative for: history of anesthetic complications  Airway Mallampati: III  TM Distance: >3 FB Neck ROM: Full    Dental  (+) Teeth Intact, Dental Advisory Given, Chipped, Poor Dentition   Pulmonary shortness of breath and lying, sleep apnea , neg recent URI,    breath sounds clear to auscultation       Cardiovascular hypertension, Pt. on home beta blockers +CHF   Rhythm:Irregular     Neuro/Psych negative neurological ROS  negative psych ROS   GI/Hepatic negative GI ROS, Neg liver ROS,   Endo/Other  diabetes, Poorly Controlled, Type 2, Oral Hypoglycemic AgentsMorbid obesity  Renal/GU Renal InsufficiencyRenal disease     Musculoskeletal   Abdominal   Peds  Hematology  (+) anemia ,   Anesthesia Other Findings   Reproductive/Obstetrics                           Anesthesia Physical Anesthesia Plan  ASA: III  Anesthesia Plan: MAC   Post-op Pain Management:    Induction: Intravenous  Airway Management Planned: Natural Airway, Nasal Cannula and Simple Face Mask  Additional Equipment: None  Intra-op Plan:   Post-operative Plan:   Informed Consent: I have reviewed the patients History and Physical, chart, labs and discussed the procedure including the risks, benefits and alternatives for the proposed anesthesia with the patient or authorized representative who has indicated his/her understanding and acceptance.   Dental advisory given  Plan Discussed with: CRNA and Surgeon  Anesthesia Plan Comments:         Anesthesia Quick Evaluation

## 2015-08-01 NOTE — Progress Notes (Signed)
ANTICOAGULATION CONSULT NOTE - Follow Up  Pharmacy Consult for heparin IV, warfarin Indication: atrial fibrillation  No Known Allergies  Patient Measurements: Height: 5\' 11"  (180.3 cm) Weight: (!) 325 lb (147.419 kg) IBW/kg (Calculated) : 75.3 Heparin Dosing Weight: 110 kg  Vital Signs: Temp: 97.6 F (36.4 C) (05/18 0833) Temp Source: Oral (05/18 0855) BP: 101/82 mmHg (05/18 0915) Pulse Rate: 39 (05/18 0855)  Labs:  Recent Labs  07/30/15 1351 07/30/15 1927  07/31/15 0150 07/31/15 0550 07/31/15 1410 08/01/15 0037  HGB 13.0  --   --  12.1*  --   --  12.4*  HCT 38.8*  --   --  35.7*  --   --  37.3*  PLT 414*  --   --  357  --   --  392  LABPROT  --  14.6  --  14.0  --   --  14.9  INR  --  1.12  --  1.06  --   --  1.15  HEPARINUNFRC  --   --   < >  --  <0.10* 0.94* 1.03*  CREATININE 2.85*  --   --  3.10*  --   --  2.91*  TROPONINI 0.23* 0.23*  --  0.22*  --   --   --   < > = values in this interval not displayed.  Estimated Creatinine Clearance: 48.7 mL/min (by C-G formula based on Cr of 2.91).  Assessment: 44 yo obese male directly admitted from MD office for Afib with RVR and CHF.  PMH HTN, CHF (preserved EF as of 2014).  Seen by Cardiology who favors TEE/DCC and consulting pharmacy to dose warfarin and IV heparin. Not on any prior anticoagulation.  Per discussion with Cardiology PA on 5/16, will d/c ASA 325 for now as no apparent Hx CAD; cards may add back low-dose ASA if new evidence of CAD.  Significant events: 5/17 heparin level was undetectable in the AM and increased to supratherapeutic levels in the afternoon following an appropriate rate adjustment.  Per RN, the previous IV line was infiltrated and patient was not likely getting the full dose of heparin, leading to the lower heparin levels.  New line was placed and level increased significantly. 5/17 Warfarin education completed 5/18 s/p DCCV maintaining NSR  Today, 08/01/2015:  Heparin level trended down  slightly but remains supratherapeutic at 0.96 despite rate decrease to 1700 units/hr  INR 1.15  CBC: Hgb low/stable, Plts WNL No bleeding/complications reported  Goal of Therapy: INR 2-3 Heparin level 0.3-0.7 units/ml Monitor platelets by anticoagulation protocol: Yes  Plan:  Warfarin 15 mg x 1 today  Decrease heparin infusion to 1450 units/hr.  Per cardiology, will continue heparin until INR 2.2 or above.  Check heparin level 6 hours after rate adjustment.  Daily CBC, INR, daily heparin level.  Monitor for signs of bleeding or thrombosis  Hershal Coria, PharmD, BCPS Pager: [phone] 08/01/2015 10:55 AM

## 2015-08-01 NOTE — H&P (View-Only) (Signed)
CARDIOLOGY CONSULT NOTE       Patient ID: John Bailey MRN: [id number] DOB/AGE: 07-30-1970 44 y.o.  Admit date: 07/30/2015 Referring Physician: Coralyn Pear  Primary Physician: Barbette Merino, MD Primary Cardiologist: [place]  Reason for Consultation: Rapid Afib  Active Problems:   Hyponatremia   AKI (acute kidney injury) ([place])   Hyperkalemia   Chronic diastolic CHF (congestive heart failure) ([place])   Morbid obesity ([place])   Atrial fibrillation ([place])   HPI:  45 y.o. obese black male. Sedentary with poor diet. 2-3 weeks malaise, increased edema and dyspnea. Transferred from Dr [name] office for rapid afib and CHF.  History of HTN and ? Diastolic CHF.  EF 50-55% 2014 with normal myovue. Denies SSCP.  On admission Cr elevated at 2.85.  Non smoker with no diabetes.  Salt indiscretion with poor diet.  ECG with RBBB and rapid Flutter. Initially with no functional iv. IV team just inserted one in [place].  Denies history of murmur, thyroid disease, stimulants , cocaine or drugs  ROS All other systems reviewed and negative except as noted above  Past Medical History  Diagnosis Date  . Hypertension     Family History  Problem Relation Age of Onset  . Heart disease Mother   . Hyperlipidemia Father   . Hypertension Father     Social History   Social History  . Marital Status: Married    Spouse Name: N/A  . Number of Children: N/A  . Years of Education: N/A   Occupational History  . Not on file.   Social History Main Topics  . Smoking status: Never Smoker   . Smokeless tobacco: Never Used  . Alcohol Use: No  . Drug Use: No  . Sexual Activity: Yes   Other Topics Concern  . Not on file   Social History Narrative    Past Surgical History  Procedure Laterality Date  . Cholecystectomy       . aspirin  325 mg Oral Daily  . carvedilol  6.25 mg Oral BID WC  . diltiazem  10 mg Intravenous Once  . furosemide  40 mg Intravenous BID  . heparin  5,000 Units Subcutaneous Q8H  .  sodium chloride flush  3 mL Intravenous Q12H   . diltiazem (CARDIZEM) infusion 5 mg/hr (07/30/15 1513)    Physical Exam: Blood pressure 136/117, pulse 36, resp. rate 27, height 5\' 11"  (1.803 m), weight 149.1 kg (328 lb 11.3 oz), SpO2 95 %.    Affect appropriate Obese black male  HEENT: normal Neck supple with no adenopathy JVP normal no bruits no thyromegaly Lungs clear with no wheezing and good diaphragmatic motion Heart:  S1/S2 no murmur, no rub, gallop or click PMI normal Abdomen: benighn, BS positve, no tenderness, no AAA no bruit.  No HSM or HJR Distal pulses intact with no bruits Plus 3 edema Neuro non-focal Skin warm and dry No muscular weakness   Labs:   Lab Results  Component Value Date   WBC 11.1* 07/30/2015   HGB 13.0 07/30/2015   HCT 38.8* 07/30/2015   MCV 88.0 07/30/2015   PLT 414* 07/30/2015    Recent Labs Lab 07/30/15 1351  NA 133*  K 4.9  CL 101  CO2 23  BUN 70*  CREATININE 2.85*  CALCIUM 8.8*  PROT 6.5  BILITOT 0.6  ALKPHOS 73  ALT 51  AST 36  GLUCOSE 212*   Lab Results  Component Value Date   TROPONINI 0.23* 07/30/2015   No results found for: CHOL  No results found for: HDL No results found for: LDLCALC No results found for: TRIG No results found for: CHOLHDL No results found for: LDLDIRECT    Radiology: Dg Chest Port 1 View  07/30/2015  CLINICAL DATA:  Shortness of breath.  History of hypertension. EXAM: PORTABLE CHEST 1 VIEW COMPARISON:  12/29/2012 FINDINGS: Mild to moderate enlargement of the cardiopericardial silhouette, stable. No mediastinal or hilar masses or evidence of adenopathy. Clear lungs.  No convincing pleural effusion or pneumothorax. Skeletal structures are unremarkable. IMPRESSION: 1. No acute cardiopulmonary disease. 2. Mild to moderate cardiomegaly. Electronically Signed   By: Lajean Manes M.D.   On: 07/30/2015 14:07    EKG: Rapid atrial flutter 168  RBBB   ASSESSMENT AND PLAN:  Fib/Flutter:  Now that iv is  in start iv cardizem drip.  Oral coreg.  Echo in am once rate better controlled. Given likely CHF need to restore SR favor TEE/DCC on Thursday morning Have arranged for 8:00 am at [facility] Discussed needing carelink at [facility] 6:30 am Thursday am to be at [facility] by 7:00am.  Given A/CRF NOAC not good idea.  Start coumadin and heparin  This patients CHA2DS2-VASc Score and unadjusted Ischemic Stroke Rate (% per year) is equal to 2.2 % stroke rate/year from a score of 2  Above score calculated as 1 point each if present [CHF, HTN, DM, Vascular=MI/PAD/Aortic Plaque, Age if 65-74, or Male] Above score calculated as 2 points each if present [Age > 75, or Stroke/TIA/TE]  CHF:  Likely tachycardia mediated.  Suspect EF a lot worse than 2014.  Despite elevated creatinine would continue bid lasix.  Echo in am [facility] may need milrinone if EF down  HTN:  May be a candidate for hydralazine and nitrates.    A/CRF:  Needs 24 hour urine protein ? From HTN disease consider renal consult  Start with UA   Discussed diagnosis, care plan and TEE/DCC with patient and wife willing tor proceed   Signed: [name] 07/30/2015, 3:14 PM

## 2015-08-01 NOTE — Progress Notes (Signed)
Report called to Joellen Jersey, RN and all questions answered. Family at bedside and pt informed.

## 2015-08-01 NOTE — Transfer of Care (Signed)
Immediate Anesthesia Transfer of Care Note  Patient: John Bailey  Procedure(s) Performed: Procedure(s): TRANSESOPHAGEAL ECHOCARDIOGRAM (TEE) (N/A) CARDIOVERSION (N/A)  Patient Location: Endoscopy Unit  Anesthesia Type:MAC  Level of Consciousness: awake  Airway & Oxygen Therapy: Patient Spontanous Breathing and Patient connected to nasal cannula oxygen  Post-op Assessment: Report given to RN  Post vital signs: Reviewed and stable  Last Vitals:  Filed Vitals:   08/01/15 0600 08/01/15 0715  BP: 124/109 121/91  Pulse:  96  Temp:  36.4 C  Resp: 19 16    Last Pain: There were no vitals filed for this visit.       Complications: No apparent anesthesia complications

## 2015-08-01 NOTE — Progress Notes (Signed)
  Echocardiogram Echocardiogram Transesophageal has been performed.  John Bailey 08/01/2015, 8:35 AM

## 2015-08-01 NOTE — CV Procedure (Signed)
See full note in [facility] NO LAA thrombus EF 30-35%   Propofol Anesthesia Dr Ermalene Postin [facility] x 1 150 J  Converted from afib rate 110 to NSR rate 78 No immediate neurologic sequelae On iv heparin  [name]

## 2015-08-02 LAB — BASIC METABOLIC PANEL
ANION GAP: 9 (ref 5–15)
BUN: 79 mg/dL — AB (ref 6–20)
CHLORIDE: 101 mmol/L (ref 101–111)
CO2: 23 mmol/L (ref 22–32)
Calcium: 8.8 mg/dL — ABNORMAL LOW (ref 8.9–10.3)
Creatinine, Ser: 2.7 mg/dL — ABNORMAL HIGH (ref 0.61–1.24)
GFR calc Af Amer: 32 mL/min — ABNORMAL LOW (ref >60)
GFR calc non Af Amer: 27 mL/min — ABNORMAL LOW (ref >60)
Glucose, Bld: 135 mg/dL — ABNORMAL HIGH (ref 65–99)
POTASSIUM: 4.8 mmol/L (ref 3.5–5.1)
SODIUM: 133 mmol/L — AB (ref 135–145)

## 2015-08-02 LAB — CBC
HEMATOCRIT: 38 % — AB (ref 39.0–52.0)
HEMOGLOBIN: 12.9 g/dL — AB (ref 13.0–17.0)
MCH: 29.5 pg (ref 26.0–34.0)
MCHC: 33.9 g/dL (ref 30.0–36.0)
MCV: 87 fL (ref 78.0–100.0)
Platelets: 395 10*3/uL (ref 150–400)
RBC: 4.37 MIL/uL (ref 4.22–5.81)
RDW: 15.1 % (ref 11.5–15.5)
WBC: 7.6 10*3/uL (ref 4.0–10.5)

## 2015-08-02 LAB — GLUCOSE, CAPILLARY
GLUCOSE-CAPILLARY: 142 mg/dL — AB (ref 65–99)
GLUCOSE-CAPILLARY: 192 mg/dL — AB (ref 65–99)
Glucose-Capillary: 172 mg/dL — ABNORMAL HIGH (ref 65–99)
Glucose-Capillary: 211 mg/dL — ABNORMAL HIGH (ref 65–99)

## 2015-08-02 LAB — PROTIME-INR
INR: 1.29 (ref 0.00–1.49)
Prothrombin Time: 15.7 seconds — ABNORMAL HIGH (ref 11.6–15.2)

## 2015-08-02 LAB — HEPARIN LEVEL (UNFRACTIONATED)
HEPARIN UNFRACTIONATED: 0.19 [IU]/mL — AB (ref 0.30–0.70)
HEPARIN UNFRACTIONATED: 0.35 [IU]/mL (ref 0.30–0.70)

## 2015-08-02 LAB — PROTEIN / CREATININE RATIO, URINE
CREATININE, URINE: 54.07 mg/dL
Protein Creatinine Ratio: 1.17 mg/mg{Cre} — ABNORMAL HIGH (ref 0.00–0.15)
TOTAL PROTEIN, URINE: 63 mg/dL

## 2015-08-02 MED ORDER — ISOSORBIDE DINITRATE 5 MG PO TABS
5.0000 mg | ORAL_TABLET | Freq: Three times a day (TID) | ORAL | Status: DC
  Administered 2015-08-02 – 2015-08-08 (×19): 5 mg via ORAL
  Filled 2015-08-02 (×22): qty 1

## 2015-08-02 MED ORDER — HYDRALAZINE HCL 10 MG PO TABS
10.0000 mg | ORAL_TABLET | Freq: Two times a day (BID) | ORAL | Status: DC
  Administered 2015-08-02: 10 mg via ORAL
  Filled 2015-08-02: qty 1

## 2015-08-02 MED ORDER — WARFARIN SODIUM 7.5 MG PO TABS
15.0000 mg | ORAL_TABLET | Freq: Once | ORAL | Status: AC
  Administered 2015-08-02: 15 mg via ORAL
  Filled 2015-08-02: qty 2

## 2015-08-02 MED ORDER — CARVEDILOL 6.25 MG PO TABS
6.2500 mg | ORAL_TABLET | Freq: Two times a day (BID) | ORAL | Status: DC
  Administered 2015-08-02 – 2015-08-04 (×4): 6.25 mg via ORAL
  Filled 2015-08-02 (×4): qty 1

## 2015-08-02 MED ORDER — CARVEDILOL 3.125 MG PO TABS
3.1250 mg | ORAL_TABLET | Freq: Two times a day (BID) | ORAL | Status: DC
  Administered 2015-08-02: 3.125 mg via ORAL
  Filled 2015-08-02: qty 1

## 2015-08-02 MED ORDER — HYDRALAZINE HCL 25 MG PO TABS
25.0000 mg | ORAL_TABLET | Freq: Two times a day (BID) | ORAL | Status: DC
  Administered 2015-08-02 – 2015-08-03 (×3): 25 mg via ORAL
  Filled 2015-08-02 (×3): qty 1

## 2015-08-02 MED ORDER — HEPARIN BOLUS VIA INFUSION
3000.0000 [IU] | Freq: Once | INTRAVENOUS | Status: AC
  Administered 2015-08-02: 3000 [IU] via INTRAVENOUS
  Filled 2015-08-02: qty 3000

## 2015-08-02 NOTE — Progress Notes (Signed)
Patient ID: John Bailey, male   DOB: 1970/12/08, 45 y.o.   MRN: [id number]    Subjective:  Feels better legs less tense   Objective:  Filed Vitals:   08/01/15 1500 08/01/15 2102 08/02/15 0511 08/02/15 0702  BP:  152/102 178/124 145/114  Pulse:  102 96   Temp: 97.8 F (36.6 C) 97.4 F (36.3 C) 98.1 F (36.7 C)   TempSrc: Axillary Axillary Axillary   Resp:  22 22   Height:      Weight:      SpO2:  100% 100%     Intake/Output from previous day:  Intake/Output Summary (Last 24 hours) at 08/02/15 0806 Last data filed at 08/02/15 0511  Gross per 24 hour  Intake 737.31 ml  Output   3315 ml  Net -2577.69 ml    Physical Exam: Affect appropriate Obese black male  HEENT: normal Neck supple with no adenopathy JVP normal no bruits no thyromegaly Lungs clear with no wheezing and good diaphragmatic motion Heart:  S1/S2 no murmur, no rub, gallop or click PMI normal Abdomen: benighn, BS positve, no tenderness, no AAA no bruit.  No HSM or HJR Distal pulses intact with no bruits Plus 2  edema Neuro non-focal Skin warm and dry No muscular weakness   Lab Results: Basic Metabolic Panel:  Recent Labs  07/31/15 0150 08/01/15 0037  NA 131* 130*  K 4.8 4.8  CL 101 99*  CO2 23 23  GLUCOSE 193* 173*  BUN 74* 76*  CREATININE 3.10* 2.91*  CALCIUM 8.2* 8.2*   Liver Function Tests:  Recent Labs  07/30/15 1351  AST 36  ALT 51  ALKPHOS 73  BILITOT 0.6  PROT 6.5  ALBUMIN 3.1*   CBC:  Recent Labs  08/01/15 0037 08/02/15 0739  WBC 8.8 7.6  HGB 12.4* 12.9*  HCT 37.3* 38.0*  MCV 89.0 87.0  PLT 392 395   Cardiac Enzymes:  Recent Labs  07/30/15 1351 07/30/15 1927 07/31/15 0150  TROPONINI 0.23* 0.23* 0.22*   Thyroid Function Tests:  Recent Labs  07/30/15 1927  TSH 1.253    Imaging: US Renal  07/31/2015  CLINICAL DATA:  Acute renal insufficiency EXAM: RENAL ULTRASOUND COMPARISON:  None. FINDINGS: Right Kidney: Length: 12.5 cm. Echogenicity and  renal cortical thickness are within normal limits. No mass, perinephric fluid, or hydronephrosis visualized. No sonographically demonstrable calculus or ureterectasis. Left Kidney: Length: 12.0 cm. Echogenicity and renal cortical thickness are within normal limits. No perinephric fluid or hydronephrosis visualized. There is a cyst arising from the upper pole left kidney, measuring 2.3 x 1.9 x 1.7 cm. No sonographically demonstrable calculus or ureterectasis. Bladder: Empty and cannot be assessed at this time. IMPRESSION: Small cyst upper pole left kidney.  Study otherwise unremarkable. Electronically Signed   By: Lowella Grip III M.D.   On: 07/31/2015 13:25    Cardiac Studies:  ECG:  Rapid atrial fibrillation/flutter RBBB   Telemetry:  NSR rate 70-90   08/02/2015   Echo: EF 50-55% restrictive filling moderate LAE   Medications:   . furosemide  120 mg Intravenous Q8H  . insulin aspart  0-20 Units Subcutaneous TID WC  . insulin aspart  0-5 Units Subcutaneous QHS  . sodium chloride flush  3 mL Intravenous Q12H  . Warfarin - Pharmacist Dosing Inpatient   Does not apply q1800     . heparin 1,000 Units/hr (08/01/15 1937)    Assessment/Plan:  Fib/Flutter: post [facility] maintaining NSR continue heparin until INR 2.2 or above  coreg for HTN/rate Lab Results  Component Value Date   INR 1.29 08/02/2015   INR 1.15 08/01/2015   INR 1.06 07/31/2015     CHF:  More diastolic and rate related EF 50-55% but restrictive filling on diastolic inflow. Not clear if there is a component Of nephrotic syndrome continue  lasix dosing per renal good output  Start low dose nitrates/hydralazine no ACE due to A/CRF  RF: Cr stable continue diuresis plan per nephrology   HTN:  Improved add hydralazine d/c cardizem post [place] 08/02/2015, 8:06 AM

## 2015-08-02 NOTE — Progress Notes (Signed)
Pt had 20 bts V-Tach. Asymptomatic. Alert. VS [id number]. MD made aware. Eulas Post, RN

## 2015-08-02 NOTE — Progress Notes (Addendum)
  [facility] Progress Note   Subjective: swelling improving. 3.3 L out yest w IV lasix  Filed Vitals:   08/01/15 2102 08/02/15 0511 08/02/15 0702 08/02/15 0851  BP: 152/102 178/124 145/114 147/102  Pulse: 102 96  97  Temp: 97.4 F (36.3 C) 98.1 F (36.7 C)    TempSrc: Axillary Axillary    Resp: 22 22    Height:      Weight:      SpO2: 100% 100%      Inpatient medications: . carvedilol  6.25 mg Oral BID WC  . furosemide  120 mg Intravenous Q8H  . hydrALAZINE  25 mg Oral BID  . insulin aspart  0-20 Units Subcutaneous TID WC  . insulin aspart  0-5 Units Subcutaneous QHS  . isosorbide dinitrate  5 mg Oral TID  . sodium chloride flush  3 mL Intravenous Q12H  . warfarin  15 mg Oral ONCE-1800  . Warfarin - Pharmacist Dosing Inpatient   Does not apply q1800   . heparin 1,300 Units/hr (08/02/15 1152)   ondansetron **OR** ondansetron (ZOFRAN) IV  Exam: Alert , no distress No jvd Chest clear bilat RRR no MRG Abd soft ntnd no mass or ascites +bs markedly obese MS no joint effusions or deformity Ext 3+ bilat LE edema mostly below the knees / no wounds or ulcers Neuro is alert, Ox 3 , nf  Date  CreateGFR Mar 2014 1.24 May 2014 1.28 Jul 30, 2015  2.85 May 173.10 May 182.9129  UA - negative CXR - clear Renal US - normal size/ echo, no hydro ECHO - LVEF 35-40%, G3DD, severe conc LVH, severe LAE, RV fxn reduced, PAP mild ^'d  Assessment: 1. Renal failure - unclear etiology, longstanding poorly controlled HTN primary culprit.  Check some serologies.  Cont to diurese.  Likely has CKD stage 3 or 4.    2. Bivent CHF / severe LVH and G4DD / EF 35-40% this admission 3. HTN - bp's up , will resume prior dosing of home BP meds 4. OBesity 5. AFib with RVR - in NSR now, on IV hep only now  Plan - cont IV lasix   Kelly Splinter MD [place] pager [phone]    cell [phone] 08/02/2015, 2:16 PM    Recent Labs Lab 07/31/15 0150 08/01/15 0037 08/02/15 0739  NA 131* 130* 133*  K 4.8 4.8 4.8  CL 101 99* 101  CO2 '23 23 23  '$ GLUCOSE 193* 173* 135*  BUN 74* 76* 79*  CREATININE 3.10* 2.91* 2.70*  CALCIUM 8.2* 8.2* 8.8*    Recent Labs Lab 07/30/15 1351  AST 36  ALT 51  ALKPHOS 73  BILITOT 0.6  PROT 6.5  ALBUMIN 3.1*    Recent Labs Lab 07/31/15 0150 08/01/15 0037 08/02/15 0739  WBC 9.6 8.8 7.6  HGB 12.1* 12.4* 12.9*  HCT 35.7* 37.3* 38.0*  MCV 89.0 89.0 87.0  PLT 357 392 395

## 2015-08-02 NOTE — Progress Notes (Signed)
ANTICOAGULATION CONSULT NOTE - Follow Up  Pharmacy Consult for heparin IV, warfarin Indication: atrial fibrillation  No Known Allergies  Patient Measurements: Height: 5\' 11"  (180.3 cm) Weight: (!) 327 lb 11.2 oz (148.644 kg) IBW/kg (Calculated) : 75.3 Heparin Dosing Weight: 110 kg  Vital Signs: Temp: 97.4 F (36.3 C) (05/19 1355) Temp Source: Oral (05/19 1355) BP: 155/102 mmHg (05/19 1757) Pulse Rate: 98 (05/19 1757)  Labs:  Recent Labs  07/31/15 0150  08/01/15 0037  08/01/15 1806 08/02/15 0739 08/02/15 2042  HGB 12.1*  --  12.4*  --   --  12.9*  --   HCT 35.7*  --  37.3*  --   --  38.0*  --   PLT 357  --  392  --   --  395  --   LABPROT 14.0  --  14.9  --   --  15.7*  --   INR 1.06  --  1.15  --   --  1.29  --   HEPARINUNFRC  --   < > 1.03*  < > 0.91* 0.19* 0.35  CREATININE 3.10*  --  2.91*  --   --  2.70*  --   TROPONINI 0.22*  --   --   --   --   --   --   < > = values in this interval not displayed.  Estimated Creatinine Clearance: 52.7 mL/min (by C-G formula based on Cr of 2.7).  Assessment: 45 yo obese male directly admitted from MD office for Afib with RVR and CHF.  PMH HTN, CHF (preserved EF as of 2014).  Seen by Cardiology who favors TEE/DCC and consulting pharmacy to dose warfarin and IV heparin. Not on any prior anticoagulation.  Per discussion with Cardiology PA on 5/16, will d/c ASA 325 for now as no apparent Hx CAD; cards may add back low-dose ASA if new evidence of CAD.  Significant events: 5/17 heparin level was undetectable in the AM and increased to supratherapeutic levels in the afternoon following an appropriate rate adjustment.  Per RN, the previous IV line was infiltrated and patient was not likely getting the full dose of heparin, leading to the lower heparin levels.  New line was placed and level increased significantly. 5/17 Warfarin education completed 5/18 s/p DCCV maintaining NSR  Today, 08/02/2015:  Heparin level this am 07:39  subtherapeutic at 0.19  Repeat Heparin level at 20:42 in desired range - 0.35, after 3000 unit bolus, rate increase to 1300 units/hr  INR 1.29, subtherapeutic  CBC: Hgb low/stable, Plts WNL No bleeding/complications reported per RN  Goal of Therapy: INR 2-3 Heparin level 0.3-0.7 units/ml Monitor platelets by anticoagulation protocol: Yes  Plan:  Warfarin 15 mg x 1 today  Continue Heparin at 1300 units/hr  Per cardiology, will continue heparin until INR 2.2 or above.  Daily CBC, INR, daily heparin level.  Monitor for signs of bleeding or thrombosis  Minda Ditto PharmD Pager [phone] 08/02/2015, 9:49 PM

## 2015-08-02 NOTE — Progress Notes (Signed)
ANTICOAGULATION CONSULT NOTE - Follow Up  Pharmacy Consult for heparin IV, warfarin Indication: atrial fibrillation  No Known Allergies  Patient Measurements: Height: 5\' 11"  (180.3 cm) Weight: (!) 325 lb (147.419 kg) IBW/kg (Calculated) : 75.3 Heparin Dosing Weight: 110 kg  Vital Signs: Temp: 98.1 F (36.7 C) (05/19 0511) Temp Source: Axillary (05/19 0511) BP: 147/102 mmHg (05/19 0851) Pulse Rate: 97 (05/19 0851)  Labs:  Recent Labs  07/30/15 1351  07/30/15 1927  07/31/15 0150  08/01/15 0037 08/01/15 1042 08/01/15 1806 08/02/15 0739  HGB 13.0  --   --   --  12.1*  --  12.4*  --   --  12.9*  HCT 38.8*  --   --   --  35.7*  --  37.3*  --   --  38.0*  PLT 414*  --   --   --  357  --  392  --   --  395  LABPROT  --   < > 14.6  --  14.0  --  14.9  --   --  15.7*  INR  --   < > 1.12  --  1.06  --  1.15  --   --  1.29  HEPARINUNFRC  --   --   --   < >  --   < > 1.03* 0.96* 0.91* 0.19*  CREATININE 2.85*  --   --   --  3.10*  --  2.91*  --   --  2.70*  TROPONINI 0.23*  --  0.23*  --  0.22*  --   --   --   --   --   < > = values in this interval not displayed.  Estimated Creatinine Clearance: 52.5 mL/min (by C-G formula based on Cr of 2.7).  Assessment: 45 yo obese male directly admitted from MD office for Afib with RVR and CHF.  PMH HTN, CHF (preserved EF as of 2014).  Seen by Cardiology who favors TEE/DCC and consulting pharmacy to dose warfarin and IV heparin. Not on any prior anticoagulation.  Per discussion with Cardiology PA on 5/16, will d/c ASA 325 for now as no apparent Hx CAD; cards may add back low-dose ASA if new evidence of CAD.  Significant events: 5/17 heparin level was undetectable in the AM and increased to supratherapeutic levels in the afternoon following an appropriate rate adjustment.  Per RN, the previous IV line was infiltrated and patient was not likely getting the full dose of heparin, leading to the lower heparin levels.  New line was placed and level  increased significantly. 5/17 Warfarin education completed 5/18 s/p DCCV maintaining NSR  Today, 08/02/2015:  Heparin level is subtherapeutic at 0.19  INR 1.29, subtherapeutic  CBC: Hgb low/stable, Plts WNL No bleeding/complications reported per RN  Goal of Therapy: INR 2-3 Heparin level 0.3-0.7 units/ml Monitor platelets by anticoagulation protocol: Yes  Plan:  Warfarin 15 mg x 1 today  Give heparin 3000 unit IV bolus x 1 Increase heparin infusion further to 1300 units/hr.  Per cardiology, will continue heparin until INR 2.2 or above.  Check heparin level 8 hours after rate adjustment.  Daily CBC, INR, daily heparin level.  Monitor for signs of bleeding or thrombosis   Royetta Asal, PharmD, BCPS Pager [phone] 08/02/2015 10:25 AM

## 2015-08-02 NOTE — Progress Notes (Signed)
PROGRESS NOTE    John Bailey  [id number] DOB: 1970/05/04 DOA: 07/30/2015 PCP: Barbette Merino, MD  Brief Narrative: John Bailey is a 45 y.o. male with medical history significant of with a past medical history of morbid obesity, chronic diastolic congestive heart failure  followed Dr. Stephens November, diabetes and hypertension, presented with generalized weakness becoming progressively worse over the past several weeks. He denies chest pain however reports exertional shortness of breath. He also complains of increasing bilateral extremity pitting edema, he was found to be in A. fib with RVR having ventricular rates in the 160s. Admitted, started on diuretics, IV heparin/warfarin, seen by Cards, s/p DCCV 5/18, Renal consulted, increased lasix dose  Assessment & Plan: 1. Atrial fibrillation with rapid ventricular response.  -likely precipitated by excess volume -s/p DCCV to NSR 5/18 -continue IV heparin and warfarin, no DOAC due to AKI -ECHO with EF of 35% and grade 3 diastolic dysfunction  2. 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 Hyponatremia -due to excess volume, improving  5. Hypertension.  -improved, hold Cardizem, hold ACE  -coreg/bidil  6. Diabetes mellitus.  -hold glimepiride due to acute renal failure , continue SSI -hba1c is 9.7  DVT prophylaxis: IV heparin  Code Status: Full code  Family Communication: wife at bedside  Disposition Plan:home in few days  when volume status better  Consultants:  Cards  Procedures: ECHo  Subjective: Feels better, swelling improving  Objective: Filed Vitals:   08/01/15 2102 08/02/15 0511 08/02/15 0702 08/02/15 0851  BP: 152/102 178/124 145/114 147/102  Pulse: 102 96  97  Temp: 97.4 F (36.3 C) 98.1 F (36.7 C)    TempSrc: Axillary Axillary    Resp: 22 22    Height:      Weight:      SpO2: 100% 100%      Intake/Output Summary (Last 24 hours) at 08/02/15 1249 Last data filed at 08/02/15 1034  Gross per 24 hour  Intake    542 ml  Output   4415 ml  Net  -3873 ml   Filed Weights   07/30/15 1400 08/01/15 0715  Weight: 149.1 kg (328 lb 11.3 oz) 147.419 kg (325 lb)    Examination:  General exam: Appears calm and comfortable , no distress Respiratory system: Clear to auscultation. Respiratory effort normal. Cardiovascular system: S1 & S2 heard, Irregular. No JVD, murmurs, rubs, gallops or clicks Gastrointestinal system: Abdomen is nondistended, soft and nontender. No organomegaly or masses felt. Normal bowel sounds heard. Abd wall edema noted Central nervous system: Alert and oriented. No focal neurological deficits. Extremities: 3plus edema-less tense now Skin: No rashes, lesions or ulcers Psychiatry: Judgement and insight appear normal. Mood & affect appropriate.     Data Reviewed: I have personally reviewed following labs and imaging studies  CBC:  Recent Labs Lab 07/30/15 1351 07/31/15 0150 08/01/15 0037 08/02/15 0739  WBC 11.1* 9.6 8.8 7.6  HGB 13.0 12.1* 12.4* 12.9*  HCT 38.8* 35.7* 37.3* 38.0*  MCV 88.0 89.0 89.0 87.0  PLT 414* 357 392 [id number]   Basic Metabolic Panel:  Recent Labs Lab 07/30/15 1351 07/31/15 0150 08/01/15 0037 08/02/15 0739  NA 133* 131* 130* 133*  K 4.9 4.8 4.8 4.8  CL 101 101 99* 101  CO2 23 23 23 23   GLUCOSE 212* 193* 173* 135*  BUN 70* 74* 76* 79*  CREATININE 2.85* 3.10* 2.91* 2.70*  CALCIUM 8.8* 8.2* 8.2* 8.8*   GFR: Estimated Creatinine  Clearance: 52.5 mL/min (by C-G formula based on Cr of 2.7). Liver Function Tests:  Recent Labs Lab 07/30/15 1351  AST 36  ALT 51  ALKPHOS 73  BILITOT 0.6  PROT 6.5  ALBUMIN 3.1*   No results for input(s): LIPASE, AMYLASE in the last 168 hours. No results for input(s): AMMONIA in the last 168 hours. Coagulation Profile:  Recent Labs Lab 07/30/15 1927 07/31/15 0150 08/01/15 0037 08/02/15 0739  INR 1.12 1.06 1.15 1.29   Cardiac Enzymes:  Recent Labs Lab 07/30/15 1351 07/30/15 1927 07/31/15 0150  TROPONINI 0.23* 0.23* 0.22*   BNP (last 3 results) No results for input(s): PROBNP in the last 8760 hours. HbA1C:  Recent Labs  07/31/15 0150  HGBA1C 9.7*   CBG:  Recent Labs Lab 08/01/15 1229 08/01/15 1721 08/01/15 2036 08/02/15 0821 08/02/15 1205  GLUCAP 222* 167* 181* 142* 192*   Lipid Profile: No results for input(s): CHOL, HDL, LDLCALC, TRIG, CHOLHDL, LDLDIRECT in the last 72 hours. Thyroid Function Tests:  Recent Labs  07/30/15 1927  TSH 1.253   Anemia Panel: No results for input(s): VITAMINB12, FOLATE, FERRITIN, TIBC, IRON, RETICCTPCT in the last 72 hours. Urine analysis:    Component Value Date/Time   COLORURINE YELLOW 07/30/2015 1621   APPEARANCEUR CLEAR 07/30/2015 1621   LABSPEC 1.014 07/30/2015 1621   PHURINE 5.0 07/30/2015 1621   GLUCOSEU NEGATIVE 07/30/2015 1621   HGBUR TRACE* 07/30/2015 1621   [place] 07/30/2015 1621   KETONESUR NEGATIVE 07/30/2015 1621   PROTEINUR 100* 07/30/2015 1621   NITRITE NEGATIVE 07/30/2015 1621   LEUKOCYTESUR NEGATIVE 07/30/2015 1621   Sepsis Labs: @LABRCNTIP (procalcitonin:4,lacticidven:4)  ) Recent Results (from the past 240 hour(s))  MRSA PCR Screening     Status: None   Collection Time: 07/30/15  2:30 PM  Result Value Ref Range Status   MRSA by PCR NEGATIVE NEGATIVE Final    Comment:        The GeneXpert MRSA Assay (FDA approved for NASAL specimens only), is one component of  a comprehensive MRSA colonization surveillance program. It is not intended to diagnose MRSA infection nor to guide or monitor treatment for MRSA infections.          Radiology Studies: US Renal  07/31/2015  CLINICAL DATA:  Acute renal insufficiency EXAM: RENAL ULTRASOUND COMPARISON:  None. FINDINGS: Right Kidney: Length: 12.5 cm. Echogenicity and renal cortical thickness are within normal limits. No mass, perinephric fluid, or hydronephrosis visualized. No sonographically demonstrable calculus or ureterectasis. Left Kidney: Length: 12.0 cm. Echogenicity and renal cortical thickness are within normal limits. No perinephric fluid or hydronephrosis visualized. There is a cyst arising from the upper pole left kidney, measuring 2.3 x 1.9 x 1.7 cm. No sonographically demonstrable calculus or ureterectasis. Bladder: Empty and cannot be assessed at this time. IMPRESSION: Small cyst upper pole left kidney.  Study otherwise unremarkable. Electronically Signed   By: Lowella Grip III M.D.   On: 07/31/2015 13:25        Scheduled Meds: . carvedilol  3.125 mg Oral BID WC  . furosemide  120 mg Intravenous Q8H  . hydrALAZINE  10 mg  Oral BID  . insulin aspart  0-20 Units Subcutaneous TID WC  . insulin aspart  0-5 Units Subcutaneous QHS  . isosorbide dinitrate  5 mg Oral TID  . sodium chloride flush  3 mL Intravenous Q12H  . warfarin  15 mg Oral ONCE-1800  . Warfarin - Pharmacist Dosing Inpatient   Does not apply q1800   Continuous Infusions: . heparin 1,300 Units/hr (08/02/15 1152)     LOS: 3 days    Time spent: 52min    Domenic Polite, MD [facility] Pager [phone]  If 7PM-7AM, please contact night-coverage [email or web address] Password [facility] 08/02/2015, 12:49 PM

## 2015-08-03 LAB — BASIC METABOLIC PANEL WITH GFR
Anion gap: 11 (ref 5–15)
BUN: 66 mg/dL — ABNORMAL HIGH (ref 6–20)
CO2: 25 mmol/L (ref 22–32)
Calcium: 8.6 mg/dL — ABNORMAL LOW (ref 8.9–10.3)
Chloride: 96 mmol/L — ABNORMAL LOW (ref 101–111)
Creatinine, Ser: 2.35 mg/dL — ABNORMAL HIGH (ref 0.61–1.24)
GFR calc Af Amer: 38 mL/min — ABNORMAL LOW
GFR calc non Af Amer: 32 mL/min — ABNORMAL LOW
Glucose, Bld: 162 mg/dL — ABNORMAL HIGH (ref 65–99)
Potassium: 4.8 mmol/L (ref 3.5–5.1)
Sodium: 132 mmol/L — ABNORMAL LOW (ref 135–145)

## 2015-08-03 LAB — CBC
HCT: 38.7 % — ABNORMAL LOW (ref 39.0–52.0)
Hemoglobin: 13.3 g/dL (ref 13.0–17.0)
MCH: 29.6 pg (ref 26.0–34.0)
MCHC: 34.4 g/dL (ref 30.0–36.0)
MCV: 86 fL (ref 78.0–100.0)
PLATELETS: UNDETERMINED 10*3/uL (ref 150–400)
RBC: 4.5 MIL/uL (ref 4.22–5.81)
RDW: 15.1 % (ref 11.5–15.5)
WBC: 5.9 10*3/uL (ref 4.0–10.5)

## 2015-08-03 LAB — PROTIME-INR
INR: 1.55 — ABNORMAL HIGH (ref 0.00–1.49)
PROTHROMBIN TIME: 18.1 s — AB (ref 11.6–15.2)

## 2015-08-03 LAB — HEPARIN LEVEL (UNFRACTIONATED)
Heparin Unfractionated: <0.1 [IU]/mL — ABNORMAL LOW (ref 0.30–0.70)
Heparin Unfractionated: 0.8 IU/mL — ABNORMAL HIGH (ref 0.30–0.70)

## 2015-08-03 LAB — HIV ANTIBODY (ROUTINE TESTING W REFLEX): HIV Screen 4th Generation wRfx: NONREACTIVE

## 2015-08-03 LAB — GLUCOSE, CAPILLARY
GLUCOSE-CAPILLARY: 229 mg/dL — AB (ref 65–99)
Glucose-Capillary: 161 mg/dL — ABNORMAL HIGH (ref 65–99)
Glucose-Capillary: 132 mg/dL — ABNORMAL HIGH (ref 65–99)
Glucose-Capillary: 173 mg/dL — ABNORMAL HIGH (ref 65–99)

## 2015-08-03 MED ORDER — WARFARIN SODIUM 7.5 MG PO TABS
15.0000 mg | ORAL_TABLET | Freq: Once | ORAL | Status: AC
  Administered 2015-08-03: 15 mg via ORAL
  Filled 2015-08-03: qty 2

## 2015-08-03 MED ORDER — HEPARIN BOLUS VIA INFUSION
3500.0000 [IU] | Freq: Once | INTRAVENOUS | Status: AC
  Administered 2015-08-03: 3500 [IU] via INTRAVENOUS
  Filled 2015-08-03: qty 3500

## 2015-08-03 NOTE — Anesthesia Postprocedure Evaluation (Signed)
Anesthesia Post Note  Patient: John Bailey  Procedure(s) Performed: Procedure(s) (LRB): TRANSESOPHAGEAL ECHOCARDIOGRAM (TEE) (N/A) CARDIOVERSION (N/A)  Patient location during evaluation: Endoscopy Anesthesia Type: MAC Level of consciousness: awake Pain management: pain level controlled Vital Signs Assessment: post-procedure vital signs reviewed and stable Respiratory status: spontaneous breathing Cardiovascular status: stable Postop Assessment: no signs of nausea or vomiting Anesthetic complications: no    Last Vitals:  Filed Vitals:   08/03/15 0545 08/03/15 1345  BP: 158/110 127/91  Pulse: 95 96  Temp: 36.4 C 36.8 C  Resp: 18 18    Last Pain:  Filed Vitals:   08/03/15 1346  PainSc: 0-No pain                 Suleika Donavan

## 2015-08-03 NOTE — Progress Notes (Signed)
ANTICOAGULATION CONSULT NOTE - Follow Up  Pharmacy Consult for heparin IV, warfarin Indication: atrial fibrillation  No Known Allergies  Patient Measurements: Height: 5\' 11"  (180.3 cm) Weight: (!) 323 lb 9.5 oz (146.78 kg) IBW/kg (Calculated) : 75.3 Heparin Dosing Weight: 110 kg  Vital Signs: Temp: 98.2 F (36.8 C) (05/20 1345) Temp Source: Oral (05/20 1345) BP: 127/91 mmHg (05/20 1345) Pulse Rate: 96 (05/20 1345)  Labs:  Recent Labs  08/01/15 0037  08/02/15 0739 08/02/15 2042 08/03/15 0513 08/03/15 1554  HGB 12.4*  --  12.9*  --  13.3  --   HCT 37.3*  --  38.0*  --  38.7*  --   PLT 392  --  395  --  PLATELET CLUMPS NOTED ON SMEAR, UNABLE TO ESTIMATE  --   LABPROT 14.9  --  15.7*  --  18.1*  --   INR 1.15  --  1.29  --  1.55*  --   HEPARINUNFRC 1.03*  < > 0.19* 0.35 <0.10* 0.80*  CREATININE 2.91*  --  2.70*  --  2.35*  --   < > = values in this interval not displayed.  Estimated Creatinine Clearance: 60.2 mL/min (by C-G formula based on Cr of 2.35).  Assessment: 45 yo obese male directly admitted from MD office for Afib with RVR and CHF.  PMH HTN, CHF (preserved EF as of 2014).  Seen by Cardiology who favors TEE/DCC and consulting pharmacy to dose warfarin and IV heparin. Not on any prior anticoagulation.  Per discussion with Cardiology PA on 5/16, will d/c ASA 325 for now as no apparent Hx CAD; cards may add back low-dose ASA if new evidence of CAD.  Significant events: 5/17 heparin level was undetectable in the AM and increased to supratherapeutic levels in the afternoon following an appropriate rate adjustment.  Per RN, the previous IV line was infiltrated and patient was not likely getting the full dose of heparin, leading to the lower heparin levels.  New line was placed and level increased significantly. 5/17 Warfarin education completed 5/18 s/p DCCV maintaining NSR  Today, 08/03/2015:  Heparin level with AM labs is <0.10  Heparin level at 1600 is 0.80  INR  1.55, subtherapeutic  CBC: Hgb stable, Plts clumped with AM labs, have been WNL during stay No bleeding/complications reported per RN  Goal of Therapy: INR 2-3 Heparin level 0.3-0.7 units/ml Monitor platelets by anticoagulation protocol: Yes  Plan:  Warfarin 15 mg x 1 today  Decrease heparin rate to 1450 units/hr.   Check anti-Xa level 6 hours after rate change and bolus given  Per cardiology, will continue heparin until INR 2.2 or above.  Daily CBC, INR, daily heparin level.  Monitor for signs of bleeding or thrombosis   Royetta Asal, PharmD, BCPS Pager [phone] 08/03/2015 5:02 PM

## 2015-08-03 NOTE — Progress Notes (Signed)
PROGRESS NOTE    John Bailey  [id number] DOB: 08/17/1970 DOA: 07/30/2015 PCP: Barbette Merino, MD  Brief Narrative: John Bailey is a 45 y.o. male with medical history significant of with a past medical history of morbid obesity, chronic diastolic congestive heart failure  followed Dr. Stephens November, diabetes and hypertension, presented with generalized weakness becoming progressively worse over the past several weeks. He denies chest pain however reports exertional shortness of breath. He also complains of increasing bilateral extremity pitting edema, he was found to be in A. fib with RVR having ventricular rates in the 160s. Admitted, started on diuretics, IV heparin/warfarin, seen by Cards, s/p DCCV 5/18, Renal consulted, increased lasix dose  Assessment & Plan: 1. Atrial fibrillation with rapid ventricular response.  -likely precipitated by excess volume -s/p DCCV to NSR 5/18 -continue IV heparin and warfarin, no DOAC due to AKI -ECHO with EF of 35% and grade 3 diastolic dysfunction  2. Acute RIght sided Heart failure and diastolic CHF -appears more like right heart failure -continue high dose IV Lasix per Renal -9.2L negative,weight down, improving well  -ECHO with EF of 35% and grade 3 diastolic dysfunction -started on imdur/hydralazine  3.AKI on CKD2 -Baseline creatinine was 1.2 in 05/2014 -Renal ultrasound without hydronephrosis -Suspect cardiorenal, Hypertensive cardiomyopathy and was also started on Lisinopril one week prior to admission -has proteinuria: likely diabetic nephropathy too -Appreciate Renal input, diuretics per Dr.Schertz -improving with diuresis  4. Hyponatremia -due to excess volume, improving  5. Hypertension.  -improved, hold Cardizem, hold ACE  -coreg/bidil  6. Diabetes mellitus.  -hold glimepiride due to acute renal failure , continue SSI -hba1c is 9.7  DVT prophylaxis: IV heparin  Code Status: Full code  Family Communication: wife at  bedside  Disposition Plan:home in few days when volume status better  Consultants:  Cards  Procedures: ECHo  Subjective: Feels better, swelling improving, no complaints  Objective: Filed Vitals:   08/02/15 1657 08/02/15 1757 08/02/15 2153 08/03/15 0545  BP:  155/102 160/107 158/110  Pulse:  98 97 95  Temp:   97.8 F (36.6 C) 97.6 F (36.4 C)  TempSrc:   Oral Oral  Resp:   18 18  Height:      Weight: 148.644 kg (327 lb 11.2 oz)   146.78 kg (323 lb 9.5 oz)  SpO2:   100% 100%    Intake/Output Summary (Last 24 hours) at 08/03/15 1246 Last data filed at 08/03/15 0857  Gross per 24 hour  Intake 706.23 ml  Output   6350 ml  Net -5643.77 ml   Filed Weights   08/01/15 0715 08/02/15 1657 08/03/15 0545  Weight: 147.419 kg (325 lb) 148.644 kg (327 lb 11.2 oz) 146.78 kg (323 lb 9.5 oz)    Examination:  General exam: Appears calm and comfortable , no distress Respiratory system: Clear to auscultation. Respiratory effort normal. Cardiovascular system: S1 & S2 heard, Irregular. No JVD, murmurs, rubs, gallops or clicks Gastrointestinal system: Abdomen is nondistended, soft and nontender. No organomegaly or masses felt. Normal bowel sounds heard. Abd wall edema -improving Central nervous system: Alert and oriented. No focal neurological deficits. Extremities: 2plus edema-less tense now Skin: No rashes, lesions or ulcers Psychiatry: Judgement and insight appear normal. Mood & affect appropriate.     Data Reviewed: I have personally reviewed following labs and imaging studies  CBC:  Recent Labs Lab 07/30/15 1351 07/31/15 0150 08/01/15 0037 08/02/15 0739 08/03/15 0513  WBC 11.1* 9.6 8.8 7.6 5.9  HGB 13.0 12.1* 12.4* 12.9*  13.3  HCT 38.8* 35.7* 37.3* 38.0* 38.7*  MCV 88.0 89.0 89.0 87.0 86.0  PLT 414* 357 392 395 PLATELET CLUMPS NOTED ON SMEAR, UNABLE TO ESTIMATE   Basic Metabolic Panel:  Recent Labs Lab 07/30/15 1351 07/31/15 0150 08/01/15 0037 08/02/15 0739  08/03/15 0513  NA 133* 131* 130* 133* 132*  K 4.9 4.8 4.8 4.8 4.8  CL 101 101 99* 101 96*  CO2 23 23 23 23 25   GLUCOSE 212* 193* 173* 135* 162*  BUN 70* 74* 76* 79* 66*  CREATININE 2.85* 3.10* 2.91* 2.70* 2.35*  CALCIUM 8.8* 8.2* 8.2* 8.8* 8.6*   GFR: Estimated Creatinine Clearance: 60.2 mL/min (by C-G formula based on Cr of 2.35). Liver Function Tests:  Recent Labs Lab 07/30/15 1351  AST 36  ALT 51  ALKPHOS 73  BILITOT 0.6  PROT 6.5  ALBUMIN 3.1*   No results for input(s): LIPASE, AMYLASE in the last 168 hours. No results for input(s): AMMONIA in the last 168 hours. Coagulation Profile:  Recent Labs Lab 07/30/15 1927 07/31/15 0150 08/01/15 0037 08/02/15 0739 08/03/15 0513  INR 1.12 1.06 1.15 1.29 1.55*   Cardiac Enzymes:  Recent Labs Lab 07/30/15 1351 07/30/15 1927 07/31/15 0150  TROPONINI 0.23* 0.23* 0.22*   BNP (last 3 results) No results for input(s): PROBNP in the last 8760 hours. HbA1C: No results for input(s): HGBA1C in the last 72 hours. CBG:  Recent Labs Lab 08/02/15 0821 08/02/15 1205 08/02/15 1643 08/02/15 2144 08/03/15 0813  GLUCAP 142* 192* 172* 211* 161*   Lipid Profile: No results for input(s): CHOL, HDL, LDLCALC, TRIG, CHOLHDL, LDLDIRECT in the last 72 hours. Thyroid Function Tests: No results for input(s): TSH, T4TOTAL, FREET4, T3FREE, THYROIDAB in the last 72 hours. Anemia Panel: No results for input(s): VITAMINB12, FOLATE, FERRITIN, TIBC, IRON, RETICCTPCT in the last 72 hours. Urine analysis:    Component Value Date/Time   COLORURINE YELLOW 07/30/2015 1621   APPEARANCEUR CLEAR 07/30/2015 1621   LABSPEC 1.014 07/30/2015 1621   PHURINE 5.0 07/30/2015 1621   GLUCOSEU NEGATIVE 07/30/2015 1621   HGBUR TRACE* 07/30/2015 1621   [place] 07/30/2015 1621   KETONESUR NEGATIVE 07/30/2015 1621   PROTEINUR 100* 07/30/2015 1621   NITRITE NEGATIVE 07/30/2015 1621   LEUKOCYTESUR NEGATIVE 07/30/2015 1621   Sepsis  Labs: @LABRCNTIP (procalcitonin:4,lacticidven:4)  ) Recent Results (from the past 240 hour(s))  MRSA PCR Screening     Status: None   Collection Time: 07/30/15  2:30 PM  Result Value Ref Range Status   MRSA by PCR NEGATIVE NEGATIVE Final    Comment:        The GeneXpert MRSA Assay (FDA approved for NASAL specimens only), is one component of a comprehensive MRSA colonization surveillance program. It is not intended to diagnose MRSA infection nor to guide or monitor treatment for MRSA infections.          Radiology Studies: No results found.      Scheduled Meds: . carvedilol  6.25 mg Oral BID WC  . furosemide  120 mg Intravenous Q8H  . heparin  3,500 Units Intravenous Once  . hydrALAZINE  25 mg Oral BID  . insulin aspart  0-20 Units Subcutaneous TID WC  . insulin aspart  0-5 Units Subcutaneous QHS  . isosorbide dinitrate  5 mg Oral TID  . sodium chloride flush  3 mL Intravenous Q12H  . warfarin  15 mg Oral ONCE-1800  . Warfarin - Pharmacist Dosing Inpatient   Does not apply q1800   Continuous Infusions: .  heparin 1,300 Units/hr (08/02/15 1812)     LOS: 4 days    Time spent: 71min    Domenic Polite, MD [facility] Pager [phone]  If 7PM-7AM, please contact night-coverage [email or web address] Password [facility] 08/03/2015, 12:46 PM

## 2015-08-03 NOTE — Progress Notes (Signed)
Patient ID: John Bailey, male   DOB: 06/05/1970, 44 y.o.   MRN: [id number]    Subjective:  Improving daily sitting in chair no dyspnea Good diuresis   Objective:  Filed Vitals:   08/02/15 1657 08/02/15 1757 08/02/15 2153 08/03/15 0545  BP:  155/102 160/107 158/110  Pulse:  98 97 95  Temp:   97.8 F (36.6 C) 97.6 F (36.4 C)  TempSrc:   Oral Oral  Resp:   18 18  Height:      Weight: 148.644 kg (327 lb 11.2 oz)   146.78 kg (323 lb 9.5 oz)  SpO2:   100% 100%    Intake/Output from previous day:  Intake/Output Summary (Last 24 hours) at 08/03/15 0831 Last data filed at 08/03/15 [id number]  Gross per 24 hour  Intake 946.23 ml  Output   7100 ml  Net -6153.77 ml    Physical Exam: Affect appropriate Obese black male  HEENT: normal Neck supple with no adenopathy JVP normal no bruits no thyromegaly Lungs clear with no wheezing and good diaphragmatic motion Heart:  S1/S2 no murmur, no rub, gallop or click PMI normal Abdomen: benighn, BS positve, no tenderness, no AAA no bruit.  No HSM or HJR Distal pulses intact with no bruits Plus 2  edema Neuro non-focal Skin warm and dry No muscular weakness   Lab Results: Basic Metabolic Panel:  Recent Labs  08/02/15 0739 08/03/15 0513  NA 133* 132*  K 4.8 4.8  CL 101 96*  CO2 23 25  GLUCOSE 135* 162*  BUN 79* 66*  CREATININE 2.70* 2.35*  CALCIUM 8.8* 8.6*   Liver Function Tests: No results for input(s): AST, ALT, ALKPHOS, BILITOT, PROT, ALBUMIN in the last 72 hours. CBC:  Recent Labs  08/02/15 0739 08/03/15 0513  WBC 7.6 5.9  HGB 12.9* 13.3  HCT 38.0* 38.7*  MCV 87.0 86.0  PLT 395 PLATELET CLUMPS NOTED ON SMEAR, UNABLE TO ESTIMATE   Cardiac Enzymes: No results for input(s): CKTOTAL, CKMB, CKMBINDEX, TROPONINI in the last 72 hours. Thyroid Function Tests: No results for input(s): TSH, T4TOTAL, T3FREE, THYROIDAB in the last 72 hours.  Invalid input(s): FREET3  Imaging: No results found.  Cardiac  Studies:  ECG:  Rapid atrial fibrillation/flutter RBBB   Telemetry:  NSR rate 70-90  Self limited NSVT last night   08/03/2015   Echo: EF 50-55% restrictive filling moderate LAE   Medications:   . carvedilol  6.25 mg Oral BID WC  . furosemide  120 mg Intravenous Q8H  . heparin  3,500 Units Intravenous Once  . hydrALAZINE  25 mg Oral BID  . insulin aspart  0-20 Units Subcutaneous TID WC  . insulin aspart  0-5 Units Subcutaneous QHS  . isosorbide dinitrate  5 mg Oral TID  . sodium chloride flush  3 mL Intravenous Q12H  . warfarin  15 mg Oral ONCE-1800  . Warfarin - Pharmacist Dosing Inpatient   Does not apply q1800     . heparin 1,300 Units/hr (08/02/15 1812)    Assessment/Plan:  Fib/Flutter: post [facility] maintaining NSR continue heparin until INR 2.2 or above coreg for HTN/rate Lab Results  Component Value Date   INR 1.55* 08/03/2015   INR 1.29 08/02/2015   INR 1.15 08/01/2015     CHF:  More diastolic and rate related EF 50-55% but restrictive filling on diastolic inflow. Not clear if there is a component Of nephrotic syndrome continue  lasix dosing per renal good output  Start low dose nitrates/hydralazine  no ACE due to A/CRF  RF: Cr improved 2.35  continue diuresis plan per nephrology   HTN:  Improved consider increasing hydralazine in am to tid  Can go home when INR Rx  [name] 08/03/2015, 8:31 AM

## 2015-08-03 NOTE — Progress Notes (Signed)
ANTICOAGULATION CONSULT NOTE - Follow Up  Pharmacy Consult for heparin IV, warfarin Indication: atrial fibrillation  No Known Allergies  Patient Measurements: Height: 5\' 11"  (180.3 cm) Weight: (!) 323 lb 9.5 oz (146.78 kg) IBW/kg (Calculated) : 75.3 Heparin Dosing Weight: 110 kg  Vital Signs: Temp: 97.6 F (36.4 C) (05/20 0545) Temp Source: Oral (05/20 0545) BP: 158/110 mmHg (05/20 0545) Pulse Rate: 95 (05/20 0545)  Labs:  Recent Labs  08/01/15 0037  08/02/15 0739 08/02/15 2042 08/03/15 0513  HGB 12.4*  --  12.9*  --  13.3  HCT 37.3*  --  38.0*  --  38.7*  PLT 392  --  395  --  PLATELET CLUMPS NOTED ON SMEAR, UNABLE TO ESTIMATE  LABPROT 14.9  --  15.7*  --  18.1*  INR 1.15  --  1.29  --  1.55*  HEPARINUNFRC 1.03*  < > 0.19* 0.35 <0.10*  CREATININE 2.91*  --  2.70*  --  2.35*  < > = values in this interval not displayed.  Estimated Creatinine Clearance: 60.2 mL/min (by C-G formula based on Cr of 2.35).  Assessment: 45 yo obese male directly admitted from MD office for Afib with RVR and CHF.  PMH HTN, CHF (preserved EF as of 2014).  Seen by Cardiology who favors TEE/DCC and consulting pharmacy to dose warfarin and IV heparin. Not on any prior anticoagulation.  Per discussion with Cardiology PA on 5/16, will d/c ASA 325 for now as no apparent Hx CAD; cards may add back low-dose ASA if new evidence of CAD.  Significant events: 5/17 heparin level was undetectable in the AM and increased to supratherapeutic levels in the afternoon following an appropriate rate adjustment.  Per RN, the previous IV line was infiltrated and patient was not likely getting the full dose of heparin, leading to the lower heparin levels.  New line was placed and level increased significantly. 5/17 Warfarin education completed 5/18 s/p DCCV maintaining NSR  Today, 08/03/2015:  Heparin level with AM labs is <0.10  INR 1.55, subtherapeutic  CBC: Hgb stable, Plts clumped with AM labs, have been WNL  during stay No bleeding/complications reported per RN  Goal of Therapy: INR 2-3 Heparin level 0.3-0.7 units/ml Monitor platelets by anticoagulation protocol: Yes  Plan:  Warfarin 15 mg x 1 today  Give 3500 unit bolus of heparin and increase heparin rate to 1650 units/hr  Check anti-Xa level 6 hours after rate change and bolus given  Per cardiology, will continue heparin until INR 2.2 or above.  Daily CBC, INR, daily heparin level.  Monitor for signs of bleeding or thrombosis   Royetta Asal, PharmD, BCPS Pager [phone] 08/03/2015 8:14 AM

## 2015-08-03 NOTE — Progress Notes (Addendum)
  [facility] Progress Note   Subjective: swelling, able to walk in halls better now.  7L UOP yesterday  Filed Vitals:   08/02/15 1757 08/02/15 2153 08/03/15 0545 08/03/15 1345  BP: 155/102 160/107 158/110 127/91  Pulse: 98 97 95 96  Temp:  97.8 F (36.6 C) 97.6 F (36.4 C) 98.2 F (36.8 C)  TempSrc:  Oral Oral Oral  Resp:  '18 18 18  '$ Height:      Weight:   146.78 kg (323 lb 9.5 oz)   SpO2:  100% 100% 100%    Inpatient medications: . carvedilol  6.25 mg Oral BID WC  . furosemide  120 mg Intravenous Q8H  . hydrALAZINE  25 mg Oral BID  . insulin aspart  0-20 Units Subcutaneous TID WC  . insulin aspart  0-5 Units Subcutaneous QHS  . isosorbide dinitrate  5 mg Oral TID  . sodium chloride flush  3 mL Intravenous Q12H  . Warfarin - Pharmacist Dosing Inpatient   Does not apply q1800   . heparin 1,450 Units/hr (08/03/15 1734)   ondansetron **OR** ondansetron (ZOFRAN) IV  Exam: Alert , no distress No jvd Chest clear bilat RRR no MRG Abd soft ntnd no mass or ascites +bs markedly obese MS no joint effusions or deformity Ext 3+ bilat LE edema mostly below the knees / no wounds or ulcers Neuro is alert, Ox 3 , nf  Date  CreateGFR Mar 2014 1.24 May 2014 1.28 Jul 30, 2015  2.85 May 173.10 May 182.9129  UA - negative CXR - clear Renal US - normal size/ echo, no hydro ECHO - LVEF 35-40%, G3DD, severe conc LVH, severe LAE, RV fxn reduced, PAP mild ^'d HIV neg Urine PCR - 1.1 gm/ 24hr  Assessment: 1. 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 New presentation.  Checking some serologies.  Cont to diurese.  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 +Proteinuria but not dramatic (1.1 mg/mgCr) so this is not nephrotic syndrome.  2. Bivent CHF / severe LVH and G4DD / LVEF 35-40% this admission 3. HTN - a little better, on coreg/ hydral 4. AFib  with RVR - in NSR now, IV hep  Plan - cont IV lasix, cont to diurese.  Will see again Monday, call w any questions   Kelly Splinter MD [facility] pager [phone]    cell [phone] 08/03/2015, 6:52 PM    Recent Labs Lab 08/01/15 0037 08/02/15 0739 08/03/15 0513  NA 130* 133* 132*  K 4.8 4.8 4.8  CL 99* 101 96*  CO2 '23 23 25  '$ GLUCOSE 173* 135* 162*  BUN 76* 79* 66*  CREATININE 2.91* 2.70* 2.35*  CALCIUM 8.2* 8.8* 8.6*    Recent Labs Lab 07/30/15 1351  AST 36  ALT 51  ALKPHOS 73  BILITOT 0.6  PROT 6.5  ALBUMIN 3.1*    Recent Labs Lab 08/01/15 0037 08/02/15 0739 08/03/15 0513  WBC 8.8 7.6 5.9  HGB 12.4* 12.9* 13.3  HCT 37.3* 38.0* 38.7*  MCV 89.0 87.0 86.0  PLT 392 395 PLATELET CLUMPS NOTED ON SMEAR, UNABLE TO ESTIMATE

## 2015-08-04 LAB — HEPATITIS PANEL, ACUTE
HCV Ab: <0.1 s/co ratio (ref 0.0–0.9)
HEP A IGM: NEGATIVE
HEP B C IGM: NEGATIVE
HEP B S AG: NEGATIVE

## 2015-08-04 LAB — GLUCOSE, CAPILLARY
GLUCOSE-CAPILLARY: 175 mg/dL — AB (ref 65–99)
Glucose-Capillary: 183 mg/dL — ABNORMAL HIGH (ref 65–99)
Glucose-Capillary: 229 mg/dL — ABNORMAL HIGH (ref 65–99)
Glucose-Capillary: 265 mg/dL — ABNORMAL HIGH (ref 65–99)

## 2015-08-04 LAB — C4 COMPLEMENT: COMPLEMENT C4, BODY FLUID: 38 mg/dL (ref 14–44)

## 2015-08-04 LAB — CBC
HEMATOCRIT: 37.7 % — AB (ref 39.0–52.0)
Hemoglobin: 12.6 g/dL — ABNORMAL LOW (ref 13.0–17.0)
MCH: 29.4 pg (ref 26.0–34.0)
MCHC: 33.4 g/dL (ref 30.0–36.0)
MCV: 88.1 fL (ref 78.0–100.0)
Platelets: 362 10*3/uL (ref 150–400)
RBC: 4.28 MIL/uL (ref 4.22–5.81)
RDW: 14.7 % (ref 11.5–15.5)
WBC: 8.1 10*3/uL (ref 4.0–10.5)

## 2015-08-04 LAB — HEPARIN LEVEL (UNFRACTIONATED)
HEPARIN UNFRACTIONATED: 0.5 [IU]/mL (ref 0.30–0.70)
Heparin Unfractionated: 0.26 IU/mL — ABNORMAL LOW (ref 0.30–0.70)

## 2015-08-04 LAB — BASIC METABOLIC PANEL
Anion gap: 10 (ref 5–15)
BUN: 62 mg/dL — ABNORMAL HIGH (ref 6–20)
CALCIUM: 8.8 mg/dL — AB (ref 8.9–10.3)
CHLORIDE: 97 mmol/L — AB (ref 101–111)
CO2: 28 mmol/L (ref 22–32)
CREATININE: 2.24 mg/dL — AB (ref 0.61–1.24)
GFR calc Af Amer: 40 mL/min — ABNORMAL LOW (ref >60)
GFR calc non Af Amer: 34 mL/min — ABNORMAL LOW (ref >60)
Glucose, Bld: 211 mg/dL — ABNORMAL HIGH (ref 65–99)
Potassium: 4.8 mmol/L (ref 3.5–5.1)
SODIUM: 135 mmol/L (ref 135–145)

## 2015-08-04 LAB — PROTIME-INR
INR: 1.85 — ABNORMAL HIGH (ref 0.00–1.49)
Prothrombin Time: 20.6 seconds — ABNORMAL HIGH (ref 11.6–15.2)

## 2015-08-04 LAB — C3 COMPLEMENT: C3 Complement: 170 mg/dL — ABNORMAL HIGH (ref 82–167)

## 2015-08-04 MED ORDER — CARVEDILOL 12.5 MG PO TABS
12.5000 mg | ORAL_TABLET | Freq: Two times a day (BID) | ORAL | Status: DC
  Administered 2015-08-04 – 2015-08-06 (×4): 12.5 mg via ORAL
  Filled 2015-08-04 (×4): qty 1

## 2015-08-04 MED ORDER — HEPARIN (PORCINE) IN NACL 100-0.45 UNIT/ML-% IJ SOLN: 1550.0000 [IU]/h | INTRAMUSCULAR | Status: AC

## 2015-08-04 MED ORDER — WARFARIN SODIUM 5 MG PO TABS
17.5000 mg | ORAL_TABLET | Freq: Once | ORAL | Status: AC
  Administered 2015-08-04: 17.5 mg via ORAL
  Filled 2015-08-04: qty 3

## 2015-08-04 MED ORDER — ENOXAPARIN SODIUM 150 MG/ML ~~LOC~~ SOLN
140.0000 mg | Freq: Two times a day (BID) | SUBCUTANEOUS | Status: DC
  Administered 2015-08-05 – 2015-08-06 (×2): 140 mg via SUBCUTANEOUS
  Filled 2015-08-04 (×4): qty 0.93

## 2015-08-04 MED ORDER — ENOXAPARIN SODIUM 150 MG/ML ~~LOC~~ SOLN
140.0000 mg | Freq: Once | SUBCUTANEOUS | Status: AC
  Administered 2015-08-05: 140 mg via SUBCUTANEOUS
  Filled 2015-08-04 (×2): qty 0.93

## 2015-08-04 MED ORDER — ENOXAPARIN SODIUM 150 MG/ML ~~LOC~~ SOLN
140.0000 mg | Freq: Once | SUBCUTANEOUS | Status: AC
  Administered 2015-08-04: 140 mg via SUBCUTANEOUS
  Filled 2015-08-04: qty 0.93

## 2015-08-04 MED ORDER — HYDRALAZINE HCL 25 MG PO TABS
25.0000 mg | ORAL_TABLET | Freq: Three times a day (TID) | ORAL | Status: DC
  Administered 2015-08-04 – 2015-08-08 (×13): 25 mg via ORAL
  Filled 2015-08-04 (×13): qty 1

## 2015-08-04 MED ORDER — ACETAMINOPHEN 325 MG PO TABS
650.0000 mg | ORAL_TABLET | Freq: Four times a day (QID) | ORAL | Status: DC | PRN
  Administered 2015-08-04 – 2015-08-07 (×7): 650 mg via ORAL
  Filled 2015-08-04 (×8): qty 2

## 2015-08-04 NOTE — Progress Notes (Signed)
PHARMACY - HEPARIN (brief note)  On IV heparin & warfarin for AFib  IV heparin infusing @ 1450 units/hr  Heparin level = 0.5 (Goal 0000000)  No complications of therapy noted  PLAN:  Continue IV heparin @ 1450 units/hr              Recheck heparin level in 6 hr to confirm therapeutic dose  Leone Haven, PharmD 08/04/15 @ 00:26

## 2015-08-04 NOTE — Progress Notes (Signed)
Patient ID: SAMEL BUTKOVICH, male   DOB: 1971/03/01, 45 y.o.   MRN: [id number]    Subjective:  Improving daily sitting in chair no dyspnea Good diuresis   Objective:  Filed Vitals:   08/03/15 1345 08/03/15 2047 08/03/15 2342 08/04/15 0601  BP: 127/91 151/111 147/99 158/106  Pulse: 96 95  103  Temp: 98.2 F (36.8 C) 98.5 F (36.9 C)  98.2 F (36.8 C)  TempSrc: Oral Oral  Oral  Resp: 18 18  18   Height:      Weight:    139.39 kg (307 lb 4.8 oz)  SpO2: 100% 99%  99%    Intake/Output from previous day:  Intake/Output Summary (Last 24 hours) at 08/04/15 0859 Last data filed at 08/04/15 [id number]  Gross per 24 hour  Intake 911.43 ml  Output   6775 ml  Net -5863.57 ml    Physical Exam: Affect appropriate Obese black male  HEENT: normal Neck supple with no adenopathy JVP normal no bruits no thyromegaly Lungs clear with no wheezing and good diaphragmatic motion Heart:  S1/S2 no murmur, no rub, gallop or click PMI normal Abdomen: benighn, BS positve, no tenderness, no AAA no bruit.  No HSM or HJR Distal pulses intact with no bruits Plus 2  edema Neuro non-focal Skin warm and dry No muscular weakness   Lab Results: Basic Metabolic Panel:  Recent Labs  08/03/15 0513 08/04/15 0549  NA 132* 135  K 4.8 4.8  CL 96* 97*  CO2 25 28  GLUCOSE 162* 211*  BUN 66* 62*  CREATININE 2.35* 2.24*  CALCIUM 8.6* 8.8*   CBC:  Recent Labs  08/03/15 0513 08/04/15 0549  WBC 5.9 8.1  HGB 13.3 12.6*  HCT 38.7* 37.7*  MCV 86.0 88.1  PLT PLATELET CLUMPS NOTED ON SMEAR, UNABLE TO ESTIMATE 362    Imaging: No results found.  Cardiac Studies:  ECG:  Rapid atrial fibrillation/flutter RBBB   Telemetry:  NSR rate 70-90  Self limited NSVT last night   08/04/2015   Echo: EF 50-55% restrictive filling moderate LAE   Medications:   . carvedilol  6.25 mg Oral BID WC  . furosemide  120 mg Intravenous Q8H  . hydrALAZINE  25 mg Oral BID  . insulin aspart  0-20 Units Subcutaneous TID  WC  . insulin aspart  0-5 Units Subcutaneous QHS  . isosorbide dinitrate  5 mg Oral TID  . sodium chloride flush  3 mL Intravenous Q12H  . warfarin  17.5 mg Oral ONCE-1800  . Warfarin - Pharmacist Dosing Inpatient   Does not apply q1800     . heparin 1,550 Units/hr (08/04/15 0807)    Assessment/Plan:  Fib/Flutter: post [facility] maintaining NSR continue heparin until INR 2.2 or above coreg for HTN/rate Lab Results  Component Value Date   INR 1.85* 08/04/2015   INR 1.55* 08/03/2015   INR 1.29 08/02/2015     CHF:  More diastolic and rate related EF 50-55% but restrictive filling on diastolic inflow. Not clear if there is a component Of nephrotic syndrome continue  lasix dosing per renal good output  Start low dose nitrates/hydralazine no ACE due to A/CRF  RF: Cr improved 2.24 continue diuresis plan per nephrology   HTN:  Increase coreg to 12.5 bid and hydralazine to tid  Can go home when INR Rx possibly in am   [name] 08/04/2015, 8:59 AM

## 2015-08-04 NOTE — Progress Notes (Signed)
ANTICOAGULATION CONSULT NOTE - Follow Up  Pharmacy Consult for heparin IV --> enoxaparin, warfarin Indication: atrial fibrillation  No Known Allergies  Patient Measurements: Height: 5\' 11"  (180.3 cm) Weight: (!) 307 lb 4.8 oz (139.39 kg) IBW/kg (Calculated) : 75.3 Heparin Dosing Weight: 110 kg  Vital Signs: Temp: 98.2 F (36.8 C) (05/21 0601) Temp Source: Oral (05/21 0601) BP: 158/106 mmHg (05/21 0601) Pulse Rate: 103 (05/21 0601)  Labs:  Recent Labs  08/02/15 0739  08/03/15 0513 08/03/15 1554 08/03/15 2348 08/04/15 0548 08/04/15 0549  HGB 12.9*  --  13.3  --   --   --  12.6*  HCT 38.0*  --  38.7*  --   --   --  37.7*  PLT 395  --  PLATELET CLUMPS NOTED ON SMEAR, UNABLE TO ESTIMATE  --   --   --  362  LABPROT 15.7*  --  18.1*  --   --   --  20.6*  INR 1.29  --  1.55*  --   --   --  1.85*  HEPARINUNFRC 0.19*  < > <0.10* 0.80* 0.50 0.26*  --   CREATININE 2.70*  --  2.35*  --   --   --  2.24*  < > = values in this interval not displayed.  Estimated Creatinine Clearance: 61.3 mL/min (by C-G formula based on Cr of 2.24).  Assessment: 45 yo obese male directly admitted from MD office for Afib with RVR and CHF.  PMH HTN, CHF (preserved EF as of 2014).  Seen by Cardiology who favors TEE/DCC and consulting pharmacy to dose warfarin and IV heparin. Not on any prior anticoagulation.  Per discussion with Cardiology PA on 5/16, will d/c ASA 325 for now as no apparent Hx CAD; cards may add back low-dose ASA if new evidence of CAD.  Significant events: 5/17 heparin level was undetectable in the AM and increased to supratherapeutic levels in the afternoon following an appropriate rate adjustment.  Per RN, the previous IV line was infiltrated and patient was not likely getting the full dose of heparin, leading to the lower heparin levels.  New line was placed and level increased significantly. 5/17 Warfarin education completed 5/18 s/p DCCV maintaining NSR  Today,  08/04/2015:  Heparin levels have been erratic requiring frequent rate adjustments and lab monitoring (sticking patient). Discussed with Dr. Broadus John these issues and with improved and relatively stable renal function last 48h (CrCL > 22ml/min), suggested change to enoxaparin until INR > 2.2 (expect will attain this goal in next 24-48h)  INR 1.85, subtherapeutic but trending up nicely  CBC: Hgb stable, Plts WNL No bleeding/complications reported per RN  Goal of Therapy: INR 2-3 LMWH anti-Xa level 0.6 -1 units/mL Monitor platelets by anticoagulation protocol: Yes  Plan:  D/c heparin gtt at 1500, give enoxaparin 140mg  (1mg /kg) x 1 at 1600 then 140mg  SQ q12h (adjust times for more convenient administration)  Warfarin 17.5 mg x 1 today in hopes to get INR > 2.2 5/22am  Continue bridge therapy until INR > 2.2 as per cardiology  Based on current INR/dosing, once INR therapeutic suspect will need 10 or 12.5mg  daily (using 5mg  tabs) until INR f/u  CBC at least q72h while on LMWH in hospital  Monitor for signs of bleeding or thrombosis  Doreene Eland, PharmD, BCPS.   Pager: [phone] 08/04/2015 1:00 PM

## 2015-08-04 NOTE — Progress Notes (Signed)
PROGRESS NOTE    John KEYSOR  [id number] DOB: Oct 09, 1970 DOA: 07/30/2015 PCP: Barbette Merino, MD  Brief Narrative: John Bailey is a 45 y.o. male with medical history significant of with a past medical history of morbid obesity, chronic diastolic congestive heart failure  followed Dr. Acie Fredrickson, diabetes and hypertension, presented with generalized weakness becoming progressively worse over the past several weeks. He denies chest pain however reports exertional shortness of breath. He also complains of increasing bilateral extremity pitting edema, he was found to be in A. fib with RVR having ventricular rates in the 160s. Admitted, started on diuretics, IV heparin/warfarin, seen by Cards, s/p DCCV 5/18, Renal consulted, increased lasix dose Diuresing well  Assessment & Plan: 1. Atrial fibrillation with rapid ventricular response.  -likely precipitated by excess volume -s/p DCCV to NSR 5/18 -continue IV heparin and warfarin, no DOAC due to AKI -ECHO with EF of 35% and grade 3 diastolic dysfunction -stable in NSR, INR 1.8 now  2. 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 Hyponatremia -due to excess volume, improving  5. Hypertension.  -improved, hold Cardizem, hold ACE  -coreg/bidil  6. Diabetes mellitus.  -hold glimepiride due to acute renal failure , continue SSI -hba1c is 9.7  DVT prophylaxis: IV heparin  Code Status:  Full code  Family Communication: wife at bedside  Disposition Plan:home in few days when volume status better  Consultants:  Cards Renal  Procedures: ECHo  Subjective: Feels better, swelling improving, no complaints In good spirits  Objective: Filed Vitals:   08/03/15 1345 08/03/15 2047 08/03/15 2342 08/04/15 0601  BP: 127/91 151/111 147/99 158/106  Pulse: 96 95  103  Temp: 98.2 F (36.8 C) 98.5 F (36.9 C)  98.2 F (36.8 C)  TempSrc: Oral Oral  Oral  Resp: 18 18  18   Height:      Weight:    139.39 kg (307 lb 4.8 oz)  SpO2: 100% 99%  99%    Intake/Output Summary (Last 24 hours) at 08/04/15 1155 Last data filed at 08/04/15 1122  Gross per 24 hour  Intake 849.43 ml  Output   7625 ml  Net -6775.57 ml   Filed Weights   08/02/15 1657 08/03/15 0545 08/04/15 0601  Weight: 148.644 kg (327 lb 11.2 oz) 146.78 kg (323 lb 9.5 oz) 139.39 kg (307 lb 4.8 oz)    Examination:  General exam: in good spirits, AAOx3 Respiratory system: Clear to auscultation. Respiratory effort normal. Cardiovascular system: S1 & S2 heard, Irregular. No JVD, murmurs, rubs, gallops or clicks Gastrointestinal system: Abdomen is nondistended, soft and nontender. No organomegaly or masses felt. Normal bowel sounds heard. Abd wall edema -improving Central nervous system: Alert and oriented. No focal neurological deficits. Extremities: 2plus edema-less tense now Skin: No rashes, lesions or ulcers Psychiatry: Judgement and insight appear normal. Mood & affect appropriate.     Data Reviewed: I have personally reviewed following labs and imaging studies  CBC:  Recent Labs Lab 07/31/15 0150 08/01/15 0037 08/02/15 0739 08/03/15 0513 08/04/15 0549  WBC 9.6 8.8  7.6 5.9 8.1  HGB 12.1* 12.4* 12.9* 13.3 12.6*  HCT 35.7* 37.3* 38.0* 38.7* 37.7*  MCV 89.0 89.0 87.0 86.0 88.1  PLT 357 392 395 PLATELET CLUMPS NOTED ON SMEAR, UNABLE TO ESTIMATE 123XX123   Basic Metabolic Panel:  Recent Labs Lab  07/31/15 0150 08/01/15 0037 08/02/15 0739 08/03/15 0513 08/04/15 0549  NA 131* 130* 133* 132* 135  K 4.8 4.8 4.8 4.8 4.8  CL 101 99* 101 96* 97*  CO2 23 23 23 25 28   GLUCOSE 193* 173* 135* 162* 211*  BUN 74* 76* 79* 66* 62*  CREATININE 3.10* 2.91* 2.70* 2.35* 2.24*  CALCIUM 8.2* 8.2* 8.8* 8.6* 8.8*   GFR: Estimated Creatinine Clearance: 61.3 mL/min (by C-G formula based on Cr of 2.24). Liver Function Tests:  Recent Labs Lab 07/30/15 1351  AST 36  ALT 51  ALKPHOS 73  BILITOT 0.6  PROT 6.5  ALBUMIN 3.1*   No results for input(s): LIPASE, AMYLASE in the last 168 hours. No results for input(s): AMMONIA in the last 168 hours. Coagulation Profile:  Recent Labs Lab 07/31/15 0150 08/01/15 0037 08/02/15 0739 08/03/15 0513 08/04/15 0549  INR 1.06 1.15 1.29 1.55* 1.85*   Cardiac Enzymes:  Recent Labs Lab 07/30/15 1351 07/30/15 1927 07/31/15 0150  TROPONINI 0.23* 0.23* 0.22*   BNP (last 3 results) No results for input(s): PROBNP in the last 8760 hours. HbA1C: No results for input(s): HGBA1C in the last 72 hours. CBG:  Recent Labs Lab 08/03/15 0813 08/03/15 1240 08/03/15 1633 08/03/15 2045 08/04/15 0752  GLUCAP 161* 229* 132* 173* 183*   Lipid Profile: No results for input(s): CHOL, HDL, LDLCALC, TRIG, CHOLHDL, LDLDIRECT in the last 72 hours. Thyroid Function Tests: No results for input(s): TSH, T4TOTAL, FREET4, T3FREE, THYROIDAB in the last 72 hours. Anemia Panel: No results for input(s): VITAMINB12, FOLATE, FERRITIN, TIBC, IRON, RETICCTPCT in the last 72 hours. Urine analysis:    Component Value Date/Time   COLORURINE YELLOW 07/30/2015 1621   APPEARANCEUR CLEAR 07/30/2015 1621   LABSPEC 1.014 07/30/2015 1621   PHURINE 5.0 07/30/2015 1621   GLUCOSEU NEGATIVE 07/30/2015 1621   HGBUR TRACE* 07/30/2015 1621   [place] 07/30/2015 1621   KETONESUR NEGATIVE 07/30/2015 1621   PROTEINUR 100* 07/30/2015 1621   NITRITE NEGATIVE 07/30/2015 1621    LEUKOCYTESUR NEGATIVE 07/30/2015 1621   Sepsis Labs: @LABRCNTIP (procalcitonin:4,lacticidven:4)  ) Recent Results (from the past 240 hour(s))  MRSA PCR Screening     Status: None   Collection Time: 07/30/15  2:30 PM  Result Value Ref Range Status   MRSA by PCR NEGATIVE NEGATIVE Final    Comment:        The GeneXpert MRSA Assay (FDA approved for NASAL specimens only), is one component of a comprehensive MRSA colonization surveillance program. It is not intended to diagnose MRSA infection nor to guide or monitor treatment for MRSA infections.          Radiology Studies: No results found.      Scheduled Meds: . carvedilol  12.5 mg Oral BID WC  . furosemide  120 mg Intravenous Q8H  . hydrALAZINE  25 mg Oral TID  . insulin aspart  0-20 Units Subcutaneous TID WC  . insulin aspart  0-5 Units Subcutaneous QHS  . isosorbide dinitrate  5 mg Oral TID  . sodium chloride flush  3 mL Intravenous Q12H  . warfarin  17.5 mg Oral ONCE-1800  . Warfarin - Pharmacist Dosing Inpatient   Does not apply q1800   Continuous  Infusions: . heparin 1,550 Units/hr (08/04/15 0807)     LOS: 5 days    Time spent: 52min    Domenic Polite, MD [facility] Pager [phone]  If 7PM-7AM, please contact night-coverage [email or web address] Password [email or web address] 08/04/2015, 11:55 AM

## 2015-08-04 NOTE — Progress Notes (Addendum)
ANTICOAGULATION CONSULT NOTE - Follow Up  Pharmacy Consult for heparin IV, warfarin Indication: atrial fibrillation  No Known Allergies  Patient Measurements: Height: 5\' 11"  (180.3 cm) Weight: (!) 307 lb 4.8 oz (139.39 kg) IBW/kg (Calculated) : 75.3 Heparin Dosing Weight: 110 kg  Vital Signs: Temp: 98.2 F (36.8 C) (05/21 0601) Temp Source: Oral (05/21 0601) BP: 158/106 mmHg (05/21 0601) Pulse Rate: 103 (05/21 0601)  Labs:  Recent Labs  08/02/15 0739  08/03/15 0513 08/03/15 1554 08/03/15 2348 08/04/15 0548 08/04/15 0549  HGB 12.9*  --  13.3  --   --   --  12.6*  HCT 38.0*  --  38.7*  --   --   --  37.7*  PLT 395  --  PLATELET CLUMPS NOTED ON SMEAR, UNABLE TO ESTIMATE  --   --   --  362  LABPROT 15.7*  --  18.1*  --   --   --  20.6*  INR 1.29  --  1.55*  --   --   --  1.85*  HEPARINUNFRC 0.19*  < > <0.10* 0.80* 0.50 0.26*  --   CREATININE 2.70*  --  2.35*  --   --   --  2.24*  < > = values in this interval not displayed.  Estimated Creatinine Clearance: 61.3 mL/min (by C-G formula based on Cr of 2.24).  Assessment: 45 yo obese male directly admitted from MD office for Afib with RVR and CHF.  PMH HTN, CHF (preserved EF as of 2014).  Seen by Cardiology who favors TEE/DCC and consulting pharmacy to dose warfarin and IV heparin. Not on any prior anticoagulation.  Per discussion with Cardiology PA on 5/16, will d/c ASA 325 for now as no apparent Hx CAD; cards may add back low-dose ASA if new evidence of CAD.  Significant events: 5/17 heparin level was undetectable in the AM and increased to supratherapeutic levels in the afternoon following an appropriate rate adjustment.  Per RN, the previous IV line was infiltrated and patient was not likely getting the full dose of heparin, leading to the lower heparin levels.  New line was placed and level increased significantly. 5/17 Warfarin education completed 5/18 s/p DCCV maintaining NSR  Today, 08/04/2015:  Heparin level with AM  labs is subtherapeutic at 0.26. Heparin levels have been erratic  Night shift RN states no problems with infusion or IV site overnight.  Infusing at Rx'd rate.   INR 1.85, subtherapeutic but trending up nicely  CBC: Hgb stable, Plts WNL No bleeding/complications reported per RN  Goal of Therapy: INR 2-3 Heparin level 0.3-0.7 units/ml Monitor platelets by anticoagulation protocol: Yes  Plan:  Warfarin 17.5 mg x 1 today in hopes to get INR > 2.2 5/22am  Based on current INR/dosing, once INR therapeutic suspect will need 10 or 12.5mg  daily (using 5mg  tabs) until INR f/u  Increase heparin rate to 1550 units/hr.   ? Enoxaparin better option d/t erratic heparin levels and LMWH would have more predictable response.   Check anti-Xa level 6 hours after rate change   Per cardiology, will continue heparin until INR 2.2 or above.  Daily CBC, INR, daily heparin level.  Monitor for signs of bleeding or thrombosis  Doreene Eland, PharmD, BCPS.   Pager: [phone] 08/04/2015 7:14 AM

## 2015-08-05 DIAGNOSIS — I5041 Acute combined systolic (congestive) and diastolic (congestive) heart failure: Secondary | ICD-10-CM

## 2015-08-05 LAB — CBC
HCT: 39.5 % (ref 39.0–52.0)
Hemoglobin: 13.1 g/dL (ref 13.0–17.0)
MCH: 29.6 pg (ref 26.0–34.0)
MCHC: 33.2 g/dL (ref 30.0–36.0)
MCV: 89.4 fL (ref 78.0–100.0)
PLATELETS: 362 10*3/uL (ref 150–400)
RBC: 4.42 MIL/uL (ref 4.22–5.81)
RDW: 14.8 % (ref 11.5–15.5)
WBC: 7.8 10*3/uL (ref 4.0–10.5)

## 2015-08-05 LAB — BASIC METABOLIC PANEL
ANION GAP: 10 (ref 5–15)
BUN: 59 mg/dL — ABNORMAL HIGH (ref 6–20)
CO2: 30 mmol/L (ref 22–32)
Calcium: 9.4 mg/dL (ref 8.9–10.3)
Chloride: 97 mmol/L — ABNORMAL LOW (ref 101–111)
Creatinine, Ser: 2.27 mg/dL — ABNORMAL HIGH (ref 0.61–1.24)
GFR calc Af Amer: 39 mL/min — ABNORMAL LOW (ref >60)
GFR, EST NON AFRICAN AMERICAN: 34 mL/min — AB (ref >60)
GLUCOSE: 175 mg/dL — AB (ref 65–99)
POTASSIUM: 4.6 mmol/L (ref 3.5–5.1)
SODIUM: 137 mmol/L (ref 135–145)

## 2015-08-05 LAB — PROTIME-INR
INR: 2.16 — ABNORMAL HIGH (ref 0.00–1.49)
PROTHROMBIN TIME: 23.2 s — AB (ref 11.6–15.2)

## 2015-08-05 LAB — GLUCOSE, CAPILLARY
GLUCOSE-CAPILLARY: 157 mg/dL — AB (ref 65–99)
Glucose-Capillary: 210 mg/dL — ABNORMAL HIGH (ref 65–99)
Glucose-Capillary: 105 mg/dL — ABNORMAL HIGH (ref 65–99)
Glucose-Capillary: 220 mg/dL — ABNORMAL HIGH (ref 65–99)

## 2015-08-05 LAB — ANTINUCLEAR ANTIBODIES, IFA: ANA Ab, IFA: NEGATIVE

## 2015-08-05 MED ORDER — WARFARIN SODIUM 5 MG PO TABS
15.0000 mg | ORAL_TABLET | Freq: Once | ORAL | Status: AC
  Administered 2015-08-05: 15 mg via ORAL
  Filled 2015-08-05: qty 3

## 2015-08-05 MED ORDER — OXYCODONE HCL 5 MG PO TABS: 5.0000 mg | ORAL_TABLET | Freq: Four times a day (QID) | ORAL | Status: DC | PRN

## 2015-08-05 NOTE — Progress Notes (Signed)
  [facility] Progress Note   Subjective: 4 liters of UOP yest- kidney function stable   Filed Vitals:   08/04/15 1800 08/04/15 2126 08/05/15 0440 08/05/15 0946  BP: 117/71 134/83 155/107 153/96  Pulse:  93 91 91  Temp:  99 F (37.2 C) 97.7 F (36.5 C)   TempSrc:  Oral Oral   Resp:  18 18   Height:      Weight:      SpO2:  98% 98%     Inpatient medications: . carvedilol  12.5 mg Oral BID WC  . enoxaparin (LOVENOX) injection  140 mg Subcutaneous Q12H  . furosemide  120 mg Intravenous Q8H  . hydrALAZINE  25 mg Oral TID  . insulin aspart  0-20 Units Subcutaneous TID WC  . insulin aspart  0-5 Units Subcutaneous QHS  . isosorbide dinitrate  5 mg Oral TID  . sodium chloride flush  3 mL Intravenous Q12H  . warfarin  15 mg Oral ONCE-1800  . Warfarin - Pharmacist Dosing Inpatient   Does not apply q1800     acetaminophen, ondansetron **OR** ondansetron (ZOFRAN) IV  Exam: Alert , no distress No jvd Chest clear bilat RRR no MRG Abd soft ntnd no mass or ascites +bs markedly obese MS no joint effusions or deformity Ext 3+ bilat LE edema mostly below the knees / no wounds or ulcers Neuro is alert, Ox 3 , nf  Date  CreateGFR Mar 2014 1.24 May 2014 1.28 Jul 30, 2015  2.85 May 173.10 May 182.9129  UA - negative CXR - clear Renal US - normal size/ echo, no hydro ECHO - LVEF 35-40%, G3DD, severe conc LVH, severe LAE, RV fxn reduced, PAP mild ^'d HIV neg Urine PCR - 1.1 gm/ 24hr  Assessment: 1. 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 New presentation.  Checking some serologies.  Cont to diurese.  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 +Proteinuria but not dramatic (1.1 mg/mgCr) so this is not nephrotic syndrome.  2. Bivent CHF / severe LVH and G4DD / LVEF 35-40% this admission- down 10 kg 3. HTN - a little better, on coreg/  hydral 4. AFib with RVR - in NSR now, IV hep 5. Anemia- not an issue   Plan - cont IV lasix, cont to diurese.    John Bailey A   08/05/2015, 12:06 PM    Recent Labs Lab 08/03/15 0513 08/04/15 0549 08/05/15 0454  NA 132* 135 137  K 4.8 4.8 4.6  CL 96* 97* 97*  CO2 _0 GLUCOSE 162* 211* 175*  BUN 66* 62* 59*  CREATININE 2.35* 2.24* 2.27*  CALCIUM 8.6* 8.8* 9.4    Recent Labs Lab 07/30/15 1351  AST 36  ALT 51  ALKPHOS 73  BILITOT 0.6  PROT 6.5  ALBUMIN 3.1*    Recent Labs Lab 08/03/15 0513 08/04/15 0549 08/05/15 0454  WBC 5.9 8.1 7.8  HGB 13.3 12.6* 13.1  HCT 38.7* 37.7* 39.5  MCV 86.0 88.1 89.4  PLT PLATELET CLUMPS NOTED ON SMEAR, UNABLE TO ESTIMATE 362 362

## 2015-08-05 NOTE — Progress Notes (Signed)
PROGRESS NOTE    John Bailey  [id number] DOB: 09-Jun-1970 DOA: 07/30/2015 PCP: Barbette Merino, MD  Brief Narrative: John Bailey is a 45 y.o. male with medical history significant of with a past medical history of morbid obesity, chronic diastolic congestive heart failure  followed Dr. Acie Fredrickson, diabetes and hypertension, presented with generalized weakness becoming progressively worse over the past several weeks. He denies chest pain however reports exertional shortness of breath. He also complains of increasing bilateral extremity pitting edema, he was found to be in A. fib with RVR having ventricular rates in the 160s. Admitted, started on diuretics, IV heparin/warfarin, seen by Cards, s/p DCCV 5/18, Renal consulted, increased lasix dose Diuresing well  Assessment & Plan: 1. Atrial fibrillation with rapid ventricular response.  -likely precipitated by excess volume -s/p DCCV to NSR 5/18 -continue warfarin, no DOAC due to AKI, stop heparin since INR >2 -ECHO with EF of 35% and grade 3 diastolic dysfunction -stable in NSR  2. 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 Hyponatremia -due to excess volume, improving  5. Hypertension.  -improved, hold Cardizem, hold ACE  -coreg/bidil  6. Diabetes mellitus.  -hold glimepiride due to acute renal failure , continue SSI -hba1c is 9.7  DVT prophylaxis: warfarin Code  Status: Full code  Family Communication: wife at bedside  Disposition Plan:home in few days when volume status better  Consultants:  Cards Renal  Procedures: ECHo  Subjective: Wondering abt going home, swelling improving, no complaints In good spirits  Objective: Filed Vitals:   08/05/15 0440 08/05/15 0946 08/05/15 1206 08/05/15 1209  BP: 155/107 153/96 139/90 139/90  Pulse: 91 91 86   Temp: 97.7 F (36.5 C)     TempSrc: Oral     Resp: 18     Height:      Weight:      SpO2: 98%       Intake/Output Summary (Last 24 hours) at 08/05/15 1441 Last data filed at 08/05/15 1200  Gross per 24 hour  Intake  199.3 ml  Output   4288 ml  Net -4088.7 ml   Filed Weights   08/02/15 1657 08/03/15 0545 08/04/15 0601  Weight: 148.644 kg (327 lb 11.2 oz) 146.78 kg (323 lb 9.5 oz) 139.39 kg (307 lb 4.8 oz)    Examination:  General exam: in good spirits, AAOx3 Respiratory system: Clear to auscultation. Respiratory effort normal. Cardiovascular system: S1 & S2 heard, Irregular. No JVD, murmurs, rubs, gallops or clicks Gastrointestinal system: Abdomen is nondistended, soft and nontender. No organomegaly or masses felt. Normal bowel sounds heard. Abd wall edema -improving Central nervous system: Alert and oriented. No focal neurological deficits. Extremities: 2plus edema-less tense now Skin: No rashes, lesions or ulcers Psychiatry: Judgement and insight appear normal. Mood & affect appropriate.     Data Reviewed: I have personally reviewed following labs and imaging studies  CBC:  Recent Labs Lab 08/01/15 0037 08/02/15 0739 08/03/15 0513 08/04/15 0549 08/05/15 0454  WBC 8.8 7.6 5.9 8.1 7.8  HGB 12.4* 12.9* 13.3 12.6*  13.1  HCT 37.3* 38.0* 38.7* 37.7* 39.5  MCV 89.0 87.0 86.0 88.1 89.4  PLT 392 395 PLATELET CLUMPS NOTED ON SMEAR, UNABLE TO ESTIMATE 362 [id number]   Basic Metabolic Panel:  Recent Labs Lab 08/01/15 0037 08/02/15 0739 08/03/15 0513 08/04/15 0549  08/05/15 0454  NA 130* 133* 132* 135 137  K 4.8 4.8 4.8 4.8 4.6  CL 99* 101 96* 97* 97*  CO2 23 23 25 28 30   GLUCOSE 173* 135* 162* 211* 175*  BUN 76* 79* 66* 62* 59*  CREATININE 2.91* 2.70* 2.35* 2.24* 2.27*  CALCIUM 8.2* 8.8* 8.6* 8.8* 9.4   GFR: Estimated Creatinine Clearance: 60.5 mL/min (by C-G formula based on Cr of 2.27). Liver Function Tests:  Recent Labs Lab 07/30/15 1351  AST 36  ALT 51  ALKPHOS 73  BILITOT 0.6  PROT 6.5  ALBUMIN 3.1*   No results for input(s): LIPASE, AMYLASE in the last 168 hours. No results for input(s): AMMONIA in the last 168 hours. Coagulation Profile:  Recent Labs Lab 08/01/15 0037 08/02/15 0739 08/03/15 0513 08/04/15 0549 08/05/15 0454  INR 1.15 1.29 1.55* 1.85* 2.16*   Cardiac Enzymes:  Recent Labs Lab 07/30/15 1351 07/30/15 1927 07/31/15 0150  TROPONINI 0.23* 0.23* 0.22*   BNP (last 3 results) No results for input(s): PROBNP in the last 8760 hours. HbA1C: No results for input(s): HGBA1C in the last 72 hours. CBG:  Recent Labs Lab 08/04/15 1145 08/04/15 1656 08/04/15 2122 08/05/15 0735 08/05/15 1155  GLUCAP 229* 175* 265* 157* 210*   Lipid Profile: No results for input(s): CHOL, HDL, LDLCALC, TRIG, CHOLHDL, LDLDIRECT in the last 72 hours. Thyroid Function Tests: No results for input(s): TSH, T4TOTAL, FREET4, T3FREE, THYROIDAB in the last 72 hours. Anemia Panel: No results for input(s): VITAMINB12, FOLATE, FERRITIN, TIBC, IRON, RETICCTPCT in the last 72 hours. Urine analysis:    Component Value Date/Time   COLORURINE YELLOW 07/30/2015 1621   APPEARANCEUR CLEAR 07/30/2015 1621   LABSPEC 1.014 07/30/2015 1621   PHURINE 5.0 07/30/2015 1621   GLUCOSEU NEGATIVE 07/30/2015 1621   HGBUR TRACE* 07/30/2015 1621   [place] 07/30/2015 1621   KETONESUR NEGATIVE 07/30/2015 1621   PROTEINUR 100* 07/30/2015 1621   NITRITE NEGATIVE 07/30/2015 1621   LEUKOCYTESUR NEGATIVE 07/30/2015 1621   Sepsis  Labs: @LABRCNTIP (procalcitonin:4,lacticidven:4)  ) Recent Results (from the past 240 hour(s))  MRSA PCR Screening     Status: None   Collection Time: 07/30/15  2:30 PM  Result Value Ref Range Status   MRSA by PCR NEGATIVE NEGATIVE Final    Comment:        The GeneXpert MRSA Assay (FDA approved for NASAL specimens only), is one component of a comprehensive MRSA colonization surveillance program. It is not intended to diagnose MRSA infection nor to guide or monitor treatment for MRSA infections.          Radiology Studies: No results found.      Scheduled Meds: . carvedilol  12.5 mg Oral BID WC  . enoxaparin (LOVENOX) injection  140 mg Subcutaneous Q12H  . furosemide  120 mg Intravenous Q8H  . hydrALAZINE  25 mg Oral TID  . insulin aspart  0-20 Units Subcutaneous TID WC  . insulin aspart  0-5 Units Subcutaneous QHS  . isosorbide dinitrate  5 mg Oral TID  . sodium chloride flush  3 mL Intravenous Q12H  . warfarin  15 mg Oral ONCE-1800  . Warfarin - Pharmacist Dosing Inpatient   Does not apply [id number]  Continuous Infusions:     LOS: 6 days    Time spent: 102min    Domenic Polite, MD [facility] Pager [phone]  If 7PM-7AM, please contact night-coverage [email or web address] Password [facility] 08/05/2015, 2:41 PM

## 2015-08-05 NOTE — Progress Notes (Signed)
ANTICOAGULATION CONSULT NOTE - Follow Up  Pharmacy Consult for enoxaparin, warfarin Indication: atrial fibrillation  No Known Allergies  Patient Measurements: Height: 5\' 11"  (180.3 cm) Weight: (!) 307 lb 4.8 oz (139.39 kg) IBW/kg (Calculated) : 75.3 Heparin Dosing Weight: 110 kg  Vital Signs: Temp: 97.7 F (36.5 C) (05/22 0440) Temp Source: Oral (05/22 0440) BP: 155/107 mmHg (05/22 0440) Pulse Rate: 91 (05/22 0440)  Labs:  Recent Labs  08/03/15 0513 08/03/15 1554 08/03/15 2348 08/04/15 0548 08/04/15 0549 08/05/15 0454  HGB 13.3  --   --   --  12.6* 13.1  HCT 38.7*  --   --   --  37.7* 39.5  PLT PLATELET CLUMPS NOTED ON SMEAR, UNABLE TO ESTIMATE  --   --   --  362 362  LABPROT 18.1*  --   --   --  20.6* 23.2*  INR 1.55*  --   --   --  1.85* 2.16*  HEPARINUNFRC <0.10* 0.80* 0.50 0.26*  --   --   CREATININE 2.35*  --   --   --  2.24* 2.27*    Estimated Creatinine Clearance: 60.5 mL/min (by C-G formula based on Cr of 2.27).  Assessment: 45 yo obese male directly admitted from MD office for Afib with RVR and CHF.  PMH HTN, CHF (preserved EF as of 2014).  Seen by Cardiology who favors TEE/DCC and consulting pharmacy to dose warfarin and IV heparin. Not on any prior anticoagulation.  Per discussion with Cardiology PA on 5/16, will d/c ASA 325 for now as no apparent Hx CAD; cards may add back low-dose ASA if new evidence of CAD.  Significant events: 5/17 heparin level was undetectable in the AM and increased to supratherapeutic levels in the afternoon following an appropriate rate adjustment.  Per RN, the previous IV line was infiltrated and patient was not likely getting the full dose of heparin, leading to the lower heparin levels.  New line was placed and level increased significantly. 5/17 Warfarin education completed 5/18 s/p DCCV maintaining NSR  Today, 08/05/2015:  Continue enoxaparin 1mg /kg (140mg ) SQ q12h - changed from heparin gtt d/t difficulty maintaining  therapeutic heparin levels, Pharmacist discussed with Dr. Broadus John these issues and with improved and relatively stable renal function last 72h (CrCL > 56ml/min), suggested change to enoxaparin until INR > 2.2 (expect will attain this goal in next 24)  INR 2.16, therapeutic but cards wants INR > 2.2 prior to discharge  CBC: Hgb stable, Plts WNL No bleeding/complications reported per RN  Goal of Therapy: INR 2-3 LMWH anti-Xa level 0.6 -1 units/mL Monitor platelets by anticoagulation protocol: Yes  Plan:  Continue enoxaparin 140mg  (1mg /kg) SQ q12h   Warfarin 15 mg x 1 today and expect INR > 2.2 5/23am  Continue bridge therapy until INR > 2.2 as per cardiology  Based on current INR/dosing, once INR therapeutic suspect will need 10 or 12.5mg  daily (using 5mg  tabs) until INR f/u  CBC at least q72h while on LMWH in hospital  Monitor for signs of bleeding or thrombosis  Doreene Eland, PharmD, BCPS.   Pager: [phone] 08/05/2015 7:10 AM

## 2015-08-05 NOTE — Progress Notes (Signed)
Subjective: No PND.  Breathing a lot better.   Objective: Vital signs in last 24 hours: Temp:  [97.7 F (36.5 C)-99 F (37.2 C)] 97.7 F (36.5 C) (05/22 0440) Pulse Rate:  [91-96] 91 (05/22 0946) Resp:  [18] 18 (05/22 0440) BP: (117-159)/(71-107) 153/96 mmHg (05/22 0946) SpO2:  [98 %-100 %] 98 % (05/22 0440) Last BM Date: 07/31/15  Intake/Output from previous day: 05/21 0701 - 05/22 0700 In: 199.3 [I.V.:137.3; IV Piggyback:62] Out: [id number] [Urine:4038] Intake/Output this shift:    Medications Scheduled Meds: . carvedilol  12.5 mg Oral BID WC  . enoxaparin (LOVENOX) injection  140 mg Subcutaneous Q12H  . furosemide  120 mg Intravenous Q8H  . hydrALAZINE  25 mg Oral TID  . insulin aspart  0-20 Units Subcutaneous TID WC  . insulin aspart  0-5 Units Subcutaneous QHS  . isosorbide dinitrate  5 mg Oral TID  . sodium chloride flush  3 mL Intravenous Q12H  . warfarin  15 mg Oral ONCE-1800  . Warfarin - Pharmacist Dosing Inpatient   Does not apply q1800   Continuous Infusions:  PRN Meds:.acetaminophen, ondansetron **OR** ondansetron (ZOFRAN) IV  PE: Well nourished, well developed, in no acute distress HEENT: Pupils are equal round react to light accommodation extraocular movements are intact.  Neck: no JVDNo cervical lymphadenopathy. Cardiac: Regular rate and rhythm without murmurs rubs or gallops. Lungs:  Mild crackles.  No wheeze.  Abd:  Feels a little tight,  nontender, positive bowel sounds all quadrants Ext: 3+ pitting lower extremity edema.  2+ radial and dorsalis pedis pulses. Skin: warm and dry Neuro:  Grossly normal    Lab Results:   Recent Labs  08/03/15 0513 08/04/15 0549 08/05/15 0454  WBC 5.9 8.1 7.8  HGB 13.3 12.6* 13.1  HCT 38.7* 37.7* 39.5  PLT PLATELET CLUMPS NOTED ON SMEAR, UNABLE TO ESTIMATE 362 362   BMET  Recent Labs  08/03/15 0513 08/04/15 0549 08/05/15 0454  NA 132* 135 137  K 4.8 4.8 4.6  CL 96* 97* 97*  CO2 25 28 30   GLUCOSE  162* 211* 175*  BUN 66* 62* 59*  CREATININE 2.35* 2.24* 2.27*  CALCIUM 8.6* 8.8* 9.4   PT/INR  Recent Labs  08/03/15 0513 08/04/15 0549 08/05/15 0454  LABPROT 18.1* 20.6* 23.2*  INR 1.55* 1.85* 2.16*      Assessment/Plan   Active Problems:   Hyponatremia   AKI (acute kidney injury) ([place])   Hyperkalemia   Chronic diastolic CHF (congestive heart failure) ([place])   Morbid obesity ([place])   Atrial fibrillation ([place])  45 y.o. obese black male. Sedentary with poor diet. 2-3 weeks malaise, increased edema and dyspnea. Transferred from Dr [name] office for rapid afib and CHF. History of HTN and ? Diastolic CHF. EF 50-55% 2014 with normal myovue.  Fib/Flutter:  Status post TEE [facility] maintaining NSR continue lovenox until INR 2.2 or above.  2.16 today.  Can stop lovenox I think..   Coreg 12.5 bid for HTN/rate.  CHF:  Net fluids: -3.8L/-19.3L. Diastolic and rate related. EF 50-55% May 2014 but EF 35-40% the day before DCCV.  Restrictive filling on diastolic [id number].  LA severely dilated.  Mild MR.  Moderate TR.  PA pres 50mmHg.  Lasix dosing per renal. Real good output over night and since adm.   Start low dose nitrates/hydralazine no ACE due to A/CRF   He still has considerable LEE.  Continue lasix drip and dosing per renal.  RF: Cr stable 2.16.  continue  diuresis plan per nephrology   HTN: Coreg increased to 12.5 bid and hydralazine to tid yesterday   LOS: 6 days    Tarri Fuller PA-C 08/05/2015 10:11 AM

## 2015-08-06 ENCOUNTER — Encounter (HOSPITAL_COMMUNITY): Payer: Self-pay | Admitting: Physician Assistant

## 2015-08-06 DIAGNOSIS — I4892 Unspecified atrial flutter: Secondary | ICD-10-CM

## 2015-08-06 HISTORY — DX: Unspecified atrial flutter: I48.92

## 2015-08-06 LAB — BASIC METABOLIC PANEL
Anion gap: 9 (ref 5–15)
BUN: 68 mg/dL — AB (ref 6–20)
CALCIUM: 8.9 mg/dL (ref 8.9–10.3)
CHLORIDE: 94 mmol/L — AB (ref 101–111)
CO2: 31 mmol/L (ref 22–32)
CREATININE: 2.53 mg/dL — AB (ref 0.61–1.24)
GFR, EST AFRICAN AMERICAN: 34 mL/min — AB (ref >60)
GFR, EST NON AFRICAN AMERICAN: 30 mL/min — AB (ref >60)
Glucose, Bld: 204 mg/dL — ABNORMAL HIGH (ref 65–99)
Potassium: 4.5 mmol/L (ref 3.5–5.1)
SODIUM: 134 mmol/L — AB (ref 135–145)

## 2015-08-06 LAB — PROTIME-INR
INR: 2.85 — AB (ref 0.00–1.49)
PROTHROMBIN TIME: 28.6 s — AB (ref 11.6–15.2)

## 2015-08-06 LAB — GLUCOSE, CAPILLARY
GLUCOSE-CAPILLARY: 141 mg/dL — AB (ref 65–99)
Glucose-Capillary: 177 mg/dL — ABNORMAL HIGH (ref 65–99)
Glucose-Capillary: 282 mg/dL — ABNORMAL HIGH (ref 65–99)
Glucose-Capillary: 241 mg/dL — ABNORMAL HIGH (ref 65–99)

## 2015-08-06 MED ORDER — WARFARIN SODIUM 5 MG PO TABS
7.5000 mg | ORAL_TABLET | Freq: Once | ORAL | Status: AC
  Administered 2015-08-06: 7.5 mg via ORAL
  Filled 2015-08-06: qty 1

## 2015-08-06 MED ORDER — PREGABALIN 50 MG PO CAPS
50.0000 mg | ORAL_CAPSULE | Freq: Every day | ORAL | Status: DC
  Administered 2015-08-06 – 2015-08-08 (×3): 50 mg via ORAL
  Filled 2015-08-06 (×3): qty 1

## 2015-08-06 MED ORDER — FUROSEMIDE 10 MG/ML IJ SOLN
80.0000 mg | Freq: Three times a day (TID) | INTRAMUSCULAR | Status: DC
  Administered 2015-08-06 – 2015-08-07 (×3): 80 mg via INTRAVENOUS
  Filled 2015-08-06 (×3): qty 8

## 2015-08-06 MED ORDER — CARVEDILOL 6.25 MG PO TABS
18.7500 mg | ORAL_TABLET | Freq: Two times a day (BID) | ORAL | Status: DC
  Administered 2015-08-06 – 2015-08-07 (×2): 18.75 mg via ORAL
  Filled 2015-08-06 (×2): qty 1

## 2015-08-06 NOTE — Progress Notes (Signed)
ANTICOAGULATION CONSULT NOTE - Follow Up  Pharmacy Consult for enoxaparin, warfarin Indication: atrial fibrillation  No Known Allergies  Patient Measurements: Height: 5\' 11"  (180.3 cm) Weight: (!) 305 lb 4.8 oz (138.483 kg) IBW/kg (Calculated) : 75.3 Heparin Dosing Weight: 110 kg  Vital Signs: Temp: 98.2 F (36.8 C) (05/23 0548) Temp Source: Oral (05/23 0548) BP: 136/87 mmHg (05/23 0548) Pulse Rate: 89 (05/23 0548)  Labs:  Recent Labs  08/03/15 1554 08/03/15 2348 08/04/15 0548 08/04/15 0549 08/05/15 0454 08/06/15 0500 08/06/15 0524  HGB  --   --   --  12.6* 13.1  --   --   HCT  --   --   --  37.7* 39.5  --   --   PLT  --   --   --  362 362  --   --   LABPROT  --   --   --  20.6* 23.2*  --  28.6*  INR  --   --   --  1.85* 2.16*  --  2.85*  HEPARINUNFRC 0.80* 0.50 0.26*  --   --   --   --   CREATININE  --   --   --  2.24* 2.27* 2.53*  --     Estimated Creatinine Clearance: 54.1 mL/min (by C-G formula based on Cr of 2.53).  Assessment: 45 yo obese male directly admitted from MD office for Afib with RVR and CHF.  PMH HTN, CHF (preserved EF as of 2014).  Seen by Cardiology who favors TEE/DCC and consulting pharmacy to dose warfarin and IV heparin. Not on any prior anticoagulation.  Per discussion with Cardiology PA on 5/16, will d/c ASA 325 for now as no apparent Hx CAD; cards may add back low-dose ASA if new evidence of CAD.  Significant events: 5/17 heparin level was undetectable in the AM and increased to supratherapeutic levels in the afternoon following an appropriate rate adjustment.  Per RN, the previous IV line was infiltrated and patient was not likely getting the full dose of heparin, leading to the lower heparin levels.  New line was placed and level increased significantly. 5/17 Warfarin education completed 5/18 s/p DCCV maintaining NSR  Today, 08/06/2015:  INR 2.85, therapeutic, cards specified that should be > 2.2 prior to discharge  CBC: Hgb stable, Plts  WNL on 5/22  Tolerating 100% heart healthy diet No bleeding/complications reported per RN  Goal of Therapy: INR 2-3  Plan:  Would dc Lovenox today as long as OK with all MD's  Decrease warfarin to 7.5mg  x 1 today since significant INR increase overnight  Based on current INR/dosing, suspect will need 10 or 12.5mg  daily starting 5/24 (using 5mg  tabs) until INR f/u  Monitor for signs of bleeding or thrombosis  Peggyann Juba, PharmD, BCPS Pager: [phone]  08/06/2015 7:46 AM

## 2015-08-06 NOTE — Progress Notes (Signed)
Patient Name: John Bailey Date of Encounter: 08/06/2015  Active Problems:   Hyponatremia   AKI (acute kidney injury) ([place])   Hyperkalemia   Chronic diastolic CHF (congestive heart failure) ([place])   Morbid obesity ([place])   Atrial fibrillation [facility])   Primary Cardiologist: Dr Acie Fredrickson Patient Profile: 45 yo male w/ hx HTN, D-CHF (NL MV 2014), admitted 05/16 with afib/RVR>>TEE/DCCV>>SR and CHF, EF now 35-40%, ?tachycardia induced CM.   SUBJECTIVE: Breathing much better, early satiety has improved as well. Has non-rx compression sock at home that help him Says will be able to get and will take rx  OBJECTIVE Filed Vitals:   08/05/15 1648 08/05/15 1814 08/05/15 2213 08/06/15 0548  BP: 177/112 144/110 147/105 136/87  Pulse:  88 76 89  Temp:   97.9 F (36.6 C) 98.2 F (36.8 C)  TempSrc:   Oral Oral  Resp:   20 20  Height:      Weight:    305 lb 4.8 oz (138.483 kg)  SpO2:   99% 92%    Intake/Output Summary (Last 24 hours) at 08/06/15 0933 Last data filed at 08/06/15 0549  Gross per 24 hour  Intake    720 ml  Output   5201 ml  Net  -4481 ml   Filed Weights   08/03/15 0545 08/04/15 0601 08/06/15 0548  Weight: 323 lb 9.5 oz (146.78 kg) 307 lb 4.8 oz (139.39 kg) 305 lb 4.8 oz (138.483 kg)    PHYSICAL EXAM General: Well developed, well nourished, male in no acute distress. Head: Normocephalic, atraumatic.  Neck: Supple without bruits, JVD 8 cm. Lungs:  Resp regular and unlabored, CTA. Heart: RRR, S1, S2, no S3, S4, or murmur; no rub. Abdomen: Soft, non-tender, non-distended, BS + x 4.  Extremities: No clubbing, cyanosis, 2-3+ edema.  Neuro: Alert and oriented X 3. Moves all extremities spontaneously. Psych: Normal affect.  LABS: CBC: Recent Labs  08/04/15 0549 08/05/15 0454  WBC 8.1 7.8  HGB 12.6* 13.1  HCT 37.7* 39.5  MCV 88.1 89.4  PLT 362 362   INR: Recent Labs  08/06/15 0524  INR 123XX123*   Basic Metabolic Panel: Recent Labs  08/05/15 0454  08/06/15 0500  NA 137 134*  K 4.6 4.5  CL 97* 94*  CO2 30 31  GLUCOSE 175* 204*  BUN 59* 68*  CREATININE 2.27* 2.53*  CALCIUM 9.4 8.9   BNP:  B NATRIURETIC PEPTIDE  Date/Time Value Ref Range Status  07/30/2015 01:53 PM 309.3* 0.0 - 100.0 pg/mL Final   TELE:   SR, ST, brief 8 bt run tachycardia ?flutter vs fib, 3 bt run NSVT     Current Medications:  . carvedilol  12.5 mg Oral BID WC  . furosemide  120 mg Intravenous Q8H  . hydrALAZINE  25 mg Oral TID  . insulin aspart  0-20 Units Subcutaneous TID WC  . insulin aspart  0-5 Units Subcutaneous QHS  . isosorbide dinitrate  5 mg Oral TID  . pregabalin  50 mg Oral Daily  . sodium chloride flush  3 mL Intravenous Q12H  . warfarin  7.5 mg Oral ONCE-1800  . Warfarin - Pharmacist Dosing Inpatient   Does not apply q1800      ASSESSMENT AND PLAN: 1. Atrial Fib/Flutter, RVR:  Status post TEE [facility] maintaining NSR lovenox>>coumadin, INR 2.85 today. Off lovenox. Coreg 12.5 bid for HTN/rate.  BP still elevated and HR >75, will increase Coreg to 18.75 mg bid  2. CHF:  Net fluids: -4.4L/-23.8L total. Wt down 23 lbs since admit Diastolic and rate related. EF 50-55% May 2014 but EF 35-40% the day before DCCV. Restrictive filling on diastolic Q000111Q. LA severely dilated. Mild MR. Moderate TR. PA pres 19mmHg. Lasix dosing per renal. Real good output over night and since adm. No ACE/ARB/Entresto due to A/CRF   He still has considerable LEE. Continue lasix drip and dosing per renal.  If compliance with TID meds will be an issue, consider change Isordil to Imdur 60 and Hydralazine to 50 mg bid at dc  3. CRF: BUN/Cr 68/2.53. Cr 3.1 on admit. continue diuresis plan per nephrology   4. HTN: Coreg increased to 12.5 bid and hydralazine 25 mg to tid 05/21  Otherwise, per IM Active Problems:   Hyponatremia   AKI (acute kidney injury) ([place])   Hyperkalemia   Chronic diastolic CHF (congestive heart failure) ([place])   Morbid  obesity ([place])   Atrial fibrillation ([place])   Signed, Rosaria Ferries , PA-C 9:33 AM 08/06/2015

## 2015-08-06 NOTE — Progress Notes (Signed)
PROGRESS NOTE    John Bailey  [id number] DOB: [birth year] DOA: 07/30/2015 PCP: Barbette Merino, MD  Brief Narrative: John Bailey is a 45 y.o. male with medical history significant of with a past medical history of morbid obesity, chronic diastolic congestive heart failure  followed Dr. Acie Fredrickson, diabetes and hypertension, presented with generalized weakness becoming progressively worse over the past several weeks. He denies chest pain however reports exertional shortness of breath. He also complains of increasing bilateral extremity pitting edema, he was found to be in A. fib with RVR having ventricular rates in the 160s. Admitted, started on diuretics, IV heparin/warfarin, seen by Cards, s/p DCCV 5/18, Renal consulted, increased lasix dose Diuresing well Negative 23L now, still has edema   Assessment & Plan: 1. Atrial fibrillation with rapid ventricular response.  -likely precipitated by excess volume -s/p DCCV to NSR 5/18 -continue warfarin, no DOAC due to AKI, stop heparin since INR >2 -ECHO with EF of 35% and grade 3 diastolic dysfunction -stable in NSR -per Cards  2. 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 Hyponatremia -due to excess volume, improving  5. Hypertension.  -improved, hold Cardizem, hold ACE  -coreg/bidil  6. Diabetes mellitus.  -hold glimepiride due to acute renal failure ,  continue SSI -hba1c is 9.7 -resume amaryl at DC  DVT prophylaxis: warfarin Code Status: Full code  Family Communication: wife at bedside  Disposition Plan:home in few days when volume status better  Consultants:  Cards Renal  Procedures: ECHo  Subjective: No complaints, feels ok  Objective: Filed Vitals:   08/05/15 1648 08/05/15 1814 08/05/15 2213 08/06/15 0548  BP: 177/112 144/110 147/105 136/87  Pulse:  88 76 89  Temp:   97.9 F (36.6 C) 98.2 F (36.8 C)  TempSrc:   Oral Oral  Resp:   20 20  Height:      Weight:    138.483 kg (305 lb 4.8 oz)  SpO2:   99% 92%    Intake/Output Summary (Last 24 hours) at 08/06/15 1535 Last data filed at 08/06/15 0549  Gross per 24 hour  Intake    240 ml  Output   2400 ml  Net  -2160 ml   Filed Weights   08/03/15 0545 08/04/15 0601 08/06/15 0548  Weight: 146.78 kg (323 lb 9.5 oz) 139.39 kg (307 lb 4.8 oz) 138.483 kg (305 lb 4.8 oz)    Examination:  General exam:  AAOx3, no distress Respiratory system: Clear to auscultation. Respiratory effort normal. Cardiovascular system: S1 & S2 heard, Irregular. No JVD, murmurs, rubs, gallops or clicks Gastrointestinal system: Abdomen is nondistended, soft and nontender. No organomegaly or masses felt. Normal bowel sounds heard. Abd wall edema -improving Central nervous system: Alert and oriented. No focal neurological deficits. Extremities: 2plus edema-much less tense now Skin: No rashes, lesions or ulcers Psychiatry: Judgement and insight appear normal. Mood & affect appropriate.     Data Reviewed: I have personally reviewed following labs and imaging studies  CBC:  Recent Labs Lab 08/01/15 0037  08/02/15 0739 08/03/15 0513 08/04/15 0549 08/05/15 0454  WBC 8.8 7.6 5.9 8.1 7.8  HGB 12.4* 12.9* 13.3 12.6* 13.1  HCT 37.3* 38.0* 38.7* 37.7* 39.5  MCV 89.0 87.0 86.0 88.1 89.4  PLT 392 395 PLATELET CLUMPS NOTED ON SMEAR, UNABLE TO ESTIMATE 362 [id number]   Basic Metabolic  Panel:  Recent Labs Lab 08/02/15 0739 08/03/15 0513 08/04/15 0549 08/05/15 0454 08/06/15 0500  NA 133* 132* 135 137 134*  K 4.8 4.8 4.8 4.6 4.5  CL 101 96* 97* 97* 94*  CO2 23 25 28 30 31   GLUCOSE 135* 162* 211* 175* 204*  BUN 79* 66* 62* 59* 68*  CREATININE 2.70* 2.35* 2.24* 2.27* 2.53*  CALCIUM 8.8* 8.6* 8.8* 9.4 8.9   GFR: Estimated Creatinine Clearance: 54.1 mL/min (by C-G formula based on Cr of 2.53). Liver Function Tests: No results for input(s): AST, ALT, ALKPHOS, BILITOT, PROT, ALBUMIN in the last 168 hours. No results for input(s): LIPASE, AMYLASE in the last 168 hours. No results for input(s): AMMONIA in the last 168 hours. Coagulation Profile:  Recent Labs Lab 08/02/15 0739 08/03/15 0513 08/04/15 0549 08/05/15 0454 08/06/15 0524  INR 1.29 1.55* 1.85* 2.16* 2.85*   Cardiac Enzymes:  Recent Labs Lab 07/30/15 1927 07/31/15 0150  TROPONINI 0.23* 0.22*   BNP (last 3 results) No results for input(s): PROBNP in the last 8760 hours. HbA1C: No results for input(s): HGBA1C in the last 72 hours. CBG:  Recent Labs Lab 08/05/15 1155 08/05/15 1705 08/05/15 2205 08/06/15 0730 08/06/15 1202  GLUCAP 210* 105* 220* 177* 282*   Lipid Profile: No results for input(s): CHOL, HDL, LDLCALC, TRIG, CHOLHDL, LDLDIRECT in the last 72 hours. Thyroid Function Tests: No results for input(s): TSH, T4TOTAL, FREET4, T3FREE, THYROIDAB in the last 72 hours. Anemia Panel: No results for input(s): VITAMINB12, FOLATE, FERRITIN, TIBC, IRON, RETICCTPCT in the last 72 hours. Urine analysis:    Component Value Date/Time   COLORURINE YELLOW 07/30/2015 1621   APPEARANCEUR CLEAR 07/30/2015 1621   LABSPEC 1.014 07/30/2015 1621   PHURINE 5.0 07/30/2015 1621   GLUCOSEU NEGATIVE 07/30/2015 1621   HGBUR TRACE* 07/30/2015 1621   [place] 07/30/2015 1621   KETONESUR NEGATIVE 07/30/2015 1621   PROTEINUR 100* 07/30/2015 1621   NITRITE NEGATIVE 07/30/2015 1621    LEUKOCYTESUR NEGATIVE 07/30/2015 1621   Sepsis Labs: @LABRCNTIP (procalcitonin:4,lacticidven:4)  ) Recent Results (from the past 240 hour(s))  MRSA PCR Screening     Status: None   Collection Time: 07/30/15  2:30 PM  Result Value Ref Range Status   MRSA by PCR NEGATIVE NEGATIVE Final    Comment:        The GeneXpert MRSA Assay (FDA approved for NASAL specimens only), is one component of a comprehensive MRSA colonization surveillance program. It is not intended to diagnose MRSA infection nor to guide or monitor treatment for MRSA infections.          Radiology Studies: No results found.      Scheduled Meds: . carvedilol  18.75 mg Oral BID WC  . furosemide  80 mg Intravenous Q8H  . hydrALAZINE  25 mg Oral TID  . insulin aspart  0-20 Units Subcutaneous TID WC  . insulin aspart  0-5 Units Subcutaneous QHS  . isosorbide dinitrate  5 mg Oral TID  . pregabalin  50 mg Oral Daily  . sodium chloride flush  3 mL Intravenous Q12H  . warfarin  7.5 mg Oral ONCE-1800  . Warfarin - Pharmacist Dosing Inpatient   Does  not apply q1800   Continuous Infusions:     LOS: 7 days    Time spent: 4min    Domenic Polite, MD [facility] Pager [phone]  If 7PM-7AM, please contact night-coverage [email or web address] Password [facility] 08/06/2015, 3:35 PM

## 2015-08-06 NOTE — Progress Notes (Signed)
  [facility] Progress Note   Subjective: 5.2 liters of UOP yest- kidney function bumped a little   Filed Vitals:   08/05/15 1648 08/05/15 1814 08/05/15 2213 08/06/15 0548  BP: 177/112 144/110 147/105 136/87  Pulse:  88 76 89  Temp:   97.9 F (36.6 C) 98.2 F (36.8 C)  TempSrc:   Oral Oral  Resp:   20 20  Height:      Weight:    138.483 kg (305 lb 4.8 oz)  SpO2:   99% 92%    Inpatient medications: . carvedilol  18.75 mg Oral BID WC  . furosemide  120 mg Intravenous Q8H  . hydrALAZINE  25 mg Oral TID  . insulin aspart  0-20 Units Subcutaneous TID WC  . insulin aspart  0-5 Units Subcutaneous QHS  . isosorbide dinitrate  5 mg Oral TID  . pregabalin  50 mg Oral Daily  . sodium chloride flush  3 mL Intravenous Q12H  . warfarin  7.5 mg Oral ONCE-1800  . Warfarin - Pharmacist Dosing Inpatient   Does not apply q1800     acetaminophen, ondansetron **OR** ondansetron (ZOFRAN) IV, oxyCODONE  Exam: Alert , no distress No jvd Chest clear bilat RRR no MRG Abd soft ntnd no mass or ascites +bs markedly obese MS no joint effusions or deformity Ext 3+ bilat LE edema mostly below the knees / no wounds or ulcers Neuro is alert, Ox 3 , nf  Date  CreateGFR Mar 2014 1.24 May 2014 1.28 Jul 30, 2015  2.85 May 173.10 May 182.9129  UA - negative CXR - clear Renal US - normal size/ echo, no hydro ECHO - LVEF 35-40%, G3DD, severe conc LVH, severe LAE, RV fxn reduced, PAP mild ^'d HIV neg Urine PCR - 1.1 gm/ 24hr  Assessment: 1. 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 New presentation.   Serologies negative.  Cont to diurese.  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 +Proteinuria but not dramatic (1.1 mg/mgCr) so this is not nephrotic syndrome. Had a little bump in creatinine- i dont think dry but may need to slow down a little on  diuresis 2. Bivent CHF / severe LVH and G4DD / LVEF 35-40% this admission- down over 10 kg 3. HTN - a little better, on coreg/ hydral as well as lasix 4. AFib with RVR - in NSR now, IV hep 5. Anemia- not an issue   Plan - cont IV lasix at slightly lower dose  Albertha Beattie A   08/06/2015, 12:31 PM    Recent Labs Lab 08/04/15 0549 08/05/15 0454 08/06/15 0500  NA 135 137 134*  K 4.8 4.6 4.5  CL 97* 97* 94*  CO2 _0 GLUCOSE 211* 175* 204*  BUN 62* 59* 68*  CREATININE 2.24* 2.27* 2.53*  CALCIUM 8.8* 9.4 8.9    Recent Labs Lab 07/30/15 1351  AST 36  ALT 51  ALKPHOS 73  BILITOT 0.6  PROT 6.5  ALBUMIN 3.1*    Recent Labs Lab 08/03/15 0513 08/04/15 0549 08/05/15 0454  WBC 5.9 8.1 7.8  HGB 13.3 12.6* 13.1  HCT 38.7* 37.7* 39.5  MCV 86.0 88.1 89.4  PLT PLATELET CLUMPS NOTED ON SMEAR, UNABLE TO ESTIMATE 362 362

## 2015-08-07 DIAGNOSIS — Z794 Long term (current) use of insulin: Secondary | ICD-10-CM

## 2015-08-07 DIAGNOSIS — E871 Hypo-osmolality and hyponatremia: Secondary | ICD-10-CM

## 2015-08-07 DIAGNOSIS — IMO0001 Reserved for inherently not codable concepts without codable children: Secondary | ICD-10-CM

## 2015-08-07 DIAGNOSIS — E1129 Type 2 diabetes mellitus with other diabetic kidney complication: Secondary | ICD-10-CM

## 2015-08-07 LAB — CBC
HCT: 37.7 % — ABNORMAL LOW (ref 39.0–52.0)
Hemoglobin: 12.8 g/dL — ABNORMAL LOW (ref 13.0–17.0)
MCH: 29.8 pg (ref 26.0–34.0)
MCHC: 34 g/dL (ref 30.0–36.0)
MCV: 87.9 fL (ref 78.0–100.0)
PLATELETS: 383 10*3/uL (ref 150–400)
RBC: 4.29 MIL/uL (ref 4.22–5.81)
RDW: 14.4 % (ref 11.5–15.5)
WBC: 8.7 10*3/uL (ref 4.0–10.5)

## 2015-08-07 LAB — PROTIME-INR
INR: 3.12 — AB (ref 0.00–1.49)
PROTHROMBIN TIME: 31.5 s — AB (ref 11.6–15.2)

## 2015-08-07 LAB — BASIC METABOLIC PANEL
Anion gap: 10 (ref 5–15)
BUN: 62 mg/dL — ABNORMAL HIGH (ref 6–20)
CALCIUM: 8.9 mg/dL (ref 8.9–10.3)
CO2: 29 mmol/L (ref 22–32)
CREATININE: 2.25 mg/dL — AB (ref 0.61–1.24)
Chloride: 92 mmol/L — ABNORMAL LOW (ref 101–111)
GFR, EST AFRICAN AMERICAN: 40 mL/min — AB (ref >60)
GFR, EST NON AFRICAN AMERICAN: 34 mL/min — AB (ref >60)
GLUCOSE: 264 mg/dL — AB (ref 65–99)
Potassium: 4 mmol/L (ref 3.5–5.1)
Sodium: 131 mmol/L — ABNORMAL LOW (ref 135–145)

## 2015-08-07 LAB — GLUCOSE, CAPILLARY
GLUCOSE-CAPILLARY: 216 mg/dL — AB (ref 65–99)
GLUCOSE-CAPILLARY: 128 mg/dL — AB (ref 65–99)
GLUCOSE-CAPILLARY: 214 mg/dL — AB (ref 65–99)
Glucose-Capillary: 200 mg/dL — ABNORMAL HIGH (ref 65–99)

## 2015-08-07 MED ORDER — INSULIN DETEMIR 100 UNIT/ML ~~LOC~~ SOLN
5.0000 [IU] | Freq: Every day | SUBCUTANEOUS | Status: DC
  Administered 2015-08-07: 5 [IU] via SUBCUTANEOUS
  Filled 2015-08-07 (×2): qty 0.05

## 2015-08-07 MED ORDER — INSULIN STARTER KIT- PEN NEEDLES (ENGLISH)
1.0000 | Freq: Once | Status: AC
  Administered 2015-08-07: 1
  Filled 2015-08-07: qty 1

## 2015-08-07 MED ORDER — LIVING WELL WITH DIABETES BOOK
Freq: Once | Status: AC
  Administered 2015-08-07: 16:00:00
  Filled 2015-08-07: qty 1

## 2015-08-07 MED ORDER — FUROSEMIDE 40 MG PO TABS
160.0000 mg | ORAL_TABLET | Freq: Two times a day (BID) | ORAL | Status: DC
  Administered 2015-08-07 – 2015-08-08 (×2): 160 mg via ORAL
  Filled 2015-08-07 (×2): qty 4

## 2015-08-07 MED ORDER — CARVEDILOL 25 MG PO TABS
25.0000 mg | ORAL_TABLET | Freq: Two times a day (BID) | ORAL | Status: DC
  Administered 2015-08-07 – 2015-08-08 (×2): 25 mg via ORAL
  Filled 2015-08-07 (×2): qty 1

## 2015-08-07 MED ORDER — WARFARIN SODIUM 2.5 MG PO TABS
2.5000 mg | ORAL_TABLET | Freq: Once | ORAL | Status: AC
  Administered 2015-08-07: 2.5 mg via ORAL
  Filled 2015-08-07: qty 1

## 2015-08-07 NOTE — Plan of Care (Addendum)
Problem: Food- and Nutrition-Related Knowledge Deficit (NB-1.1) Goal: Nutrition education Formal process to instruct or train a patient/client in a skill or to impart knowledge to help patients/clients voluntarily manage or modify food choices and eating behavior to maintain or improve health. Outcome: Completed/Met Date Met:  08/07/15  RD consulted for nutrition education regarding Low Na diet and diabetes with renal complications.    Lab Results  Component Value Date    HGBA1C 9.7* 07/31/2015    RD provided "Carbohydrate Counting for People with Diabetes" and Low Sodium Nutrition Therapy" handouts from the Academy of [employer] and Dietetics. Discussed different food groups and their effects on blood sugar, emphasizing carbohydrate-containing foods. Provided list of carbohydrates and recommended serving sizes of common foods. Discussed importance of controlled and consistent carbohydrate intake throughout the day. Provided examples of ways to balance meals/snacks and encouraged intake of high-fiber, whole grain complex carbohydrates.  Provided examples on ways to decrease sodium and fat intake in diet. Discouraged intake of processed foods and use of salt shaker. Teach back method used.  Expect fair compliance. Patient seemed overwhelmed by education. Patient would benefit from outpatient education where more time can be spent reviewing dietary strategies and carbohydrate counting. Recommend MD referral for outpatient education.  Body mass index is 42.39 kg/(m^2). Pt meets criteria for obesity based on current BMI.  Current diet order is CHO modified/Heart Healthy, patient is consuming approximately 100% of meals at this time. Labs and medications reviewed. No further nutrition interventions warranted at this time.  If additional nutrition issues arise, please re-consult RD.  Clayton Bibles, MS, RD, LDN Pager: [phone] After Hours Pager: [phone]

## 2015-08-07 NOTE — Progress Notes (Signed)
Patient Name: John Bailey Date of Encounter: 08/07/2015  Active Problems:   Hyponatremia   AKI (acute kidney injury) ([place])   Hyperkalemia   Chronic diastolic CHF (congestive heart failure) ([place])   Morbid obesity ([place])   Atrial fibrillation ([place])   Atrial flutter with rapid ventricular response [facility])   Primary Cardiologist: Dr Acie Fredrickson  Patient Profile: 45 yo male w/ hx HTN, D-CHF (NL MV 2014), admitted 05/16 w/ afib/RVR>>TEE/DCCV>>SR and CHF, EF now 35-40%, ?tachycardia induced CM.   SUBJECTIVE: Compression stockings helping, wants to talk to nutritionist, no chest pain or SOB  OBJECTIVE Filed Vitals:   08/06/15 0548 08/06/15 1601 08/06/15 2126 08/07/15 0456  BP: 136/87 141/95 150/106 153/100  Pulse: 89 81 95 88  Temp: 98.2 F (36.8 C) 97.9 F (36.6 C) 98.4 F (36.9 C) 98 F (36.7 C)  TempSrc: Oral Oral Oral Oral  Resp: 20 20 18 18   Height:      Weight: 305 lb 4.8 oz (138.483 kg)   303 lb 12.8 oz (137.803 kg)  SpO2: 92% 99% 97% 97%    Intake/Output Summary (Last 24 hours) at 08/07/15 0747 Last data filed at 08/07/15 0457  Gross per 24 hour  Intake    480 ml  Output   4500 ml  Net  -4020 ml   Filed Weights   08/04/15 0601 08/06/15 0548 08/07/15 0456  Weight: 307 lb 4.8 oz (139.39 kg) 305 lb 4.8 oz (138.483 kg) 303 lb 12.8 oz (137.803 kg)    PHYSICAL EXAM General: Well developed, well nourished, male in no acute distress. Head: Normocephalic, atraumatic.  Neck: Supple without bruits, JVD minimal elevation. Lungs:  Resp regular and unlabored, CTA. Heart: RRR, S1, S2, no S3, S4, or murmur; no rub. Abdomen: Soft, non-tender, non-distended, BS + x 4.  Extremities: No clubbing, cyanosis, 1-2+ edema.  Neuro: Alert and oriented X 3. Moves all extremities spontaneously. Psych: Normal affect.  LABS: CBC: Recent Labs  08/05/15 0454 08/07/15 0525  WBC 7.8 8.7  HGB 13.1 12.8*  HCT 39.5 37.7*  MCV 89.4 87.9  PLT 362 383   INR: Recent Labs   08/07/15 0525  INR 123456*   Basic Metabolic Panel: Recent Labs  08/06/15 0500 08/07/15 0525  NA 134* 131*  K 4.5 4.0  CL 94* 92*  CO2 31 29  GLUCOSE 204* 264*  BUN 68* 62*  CREATININE 2.53* 2.25*  CALCIUM 8.9 8.9   BNP:  B NATRIURETIC PEPTIDE  Date/Time Value Ref Range Status  07/30/2015 01:53 PM 309.3* 0.0 - 100.0 pg/mL Final   TELE: SR, no sig ectopy     Current Medications:  . carvedilol  18.75 mg Oral BID WC  . furosemide  80 mg Intravenous Q8H  . hydrALAZINE  25 mg Oral TID  . insulin aspart  0-20 Units Subcutaneous TID WC  . insulin aspart  0-5 Units Subcutaneous QHS  . isosorbide dinitrate  5 mg Oral TID  . pregabalin  50 mg Oral Daily  . sodium chloride flush  3 mL Intravenous Q12H  . Warfarin - Pharmacist Dosing Inpatient   Does not apply q1800      ASSESSMENT AND PLAN: 1. Atrial Fib/Flutter, RVR:  Status post TEE [facility] [place] maintaining NSR lovenox>>coumadin, INR 3.12 today. Off lovenox. Coreg 12.5 bid for HTN/rate.  BP still elevated and HR >75, will increase Coreg to 25 mg bid   2. CHF:  Net fluids: -4.5L/-27.8L total. Wt down 25 lbs since admit Diastolic and rate  related. EF 50-55% May 2014 but EF 35-40% 05/17, before DCCV. Restrictive filling on diastolic Q000111Q. LA severely dilated. Mild MR. Moderate TR. PA pres 16mmHg. Lasix dosing per renal. Real good output over night and since adm. No ACE/ARB/Entresto due to A/CRF   He still has LEE. Continue Lasix, dosing per renal.  If compliance with TID meds will be an issue, consider change Isordil to Imdur 60 and Hydralazine to 50 mg bid at dc  3. CRF: BUN/Cr 68/2.53. Cr 3.1 on admit. continue diuresis, plan per nephrology   4. HTN: Coreg increased to 25 mg bid 05/24, hydralazine 25 mg tid since 05/21  Otherwise, per IM Active Problems:   Hyponatremia   AKI (acute kidney injury) ([place])   Hyperkalemia   Chronic diastolic CHF (congestive heart failure) ([place])   Morbid obesity ([place])    Atrial fibrillation ([place])   Atrial flutter with rapid ventricular response ([place])   Signed, Rosaria Ferries , PA-C 7:47 AM 08/07/2015

## 2015-08-07 NOTE — Progress Notes (Signed)
ANTICOAGULATION CONSULT NOTE - Follow Up  Pharmacy Consult for warfarin Indication: atrial fibrillation  No Known Allergies  Patient Measurements: Height: 5\' 11"  (180.3 cm) Weight: (!) 303 lb 12.8 oz (137.803 kg) IBW/kg (Calculated) : 75.3 Heparin Dosing Weight: 110 kg  Vital Signs: Temp: 98 F (36.7 C) (05/24 0456) Temp Source: Oral (05/24 0456) BP: 153/100 mmHg (05/24 0456) Pulse Rate: 88 (05/24 0456)  Labs:  Recent Labs  08/05/15 0454 08/06/15 0500 08/06/15 0524 08/07/15 0525  HGB 13.1  --   --  12.8*  HCT 39.5  --   --  37.7*  PLT 362  --   --  383  LABPROT 23.2*  --  28.6* 31.5*  INR 2.16*  --  2.85* 3.12*  CREATININE 2.27* 2.53*  --  2.25*    Estimated Creatinine Clearance: 60.7 mL/min (by C-G formula based on Cr of 2.25).  Assessment: 44 yo obese male directly admitted from MD office for Afib with RVR and CHF.  PMH HTN, CHF (preserved EF as of 2014).  Seen by Cardiology who favors TEE/DCC and consulting pharmacy to dose warfarin and IV heparin. Not on any prior anticoagulation.  Per discussion with Cardiology PA on 5/16, will d/c ASA 325 for now as no apparent Hx CAD; cards may add back low-dose ASA if new evidence of CAD.  Significant events: 5/17: heparin level was undetectable in the AM and increased to supratherapeutic levels in the afternoon following an appropriate rate adjustment.  Per RN, the previous IV line was infiltrated and patient was not likely getting the full dose of heparin, leading to the lower heparin levels.  New line was placed and level increased significantly. 5/17 Warfarin education completed 5/18: s/p DCCV maintaining NSR 5/24: LMWH d/ced  Today, 08/07/2015:  INR is slightly supratherapeutic today at 3.12; cards specified that should be > 2.2 prior to discharge  CBC: stable  No bleeding documented  Tolerating 100% heart healthy diet  No significant drug-drug intxns  Goal of Therapy: INR 2-3  Plan: - Since patient has been  requiring large doses of warfarin to get level therapeutic and level is only slightly supratherapeutic today, will give a small dose of 2.5 mg PO x1 tonight - Monitor for signs of bleeding or thrombosis  Dia Sitter, PharmD, BCPS 08/07/2015 8:45 AM

## 2015-08-07 NOTE — Progress Notes (Signed)
Patient was given Living with Diabetes book as well as handout on all the diabetes videos to watch.  RN showed patient how to watch the videos.  Patient stated "i'm not doing this now.  I don't even know if I'm going to take this when I get home." RN encouraged patient to review the material and patient stated he "might later."

## 2015-08-07 NOTE — Progress Notes (Signed)
[facility] PROGRESS NOTE    Progress Note  John Bailey  [id number] DOB: May 15, 1970 DOA: 07/30/2015 PCP: Barbette Merino, MD     Brief Narrative:   John Bailey is an 45 y.o. male with past medical history significant for morbid obesity, chronic diastolic heart failure, diabetes mellitus and essential hypertension that comes in with generalized weakness that has progressively last several weeks.  Assessment/Plan:   A. fib with rapid RVR: Likely due to volume overload, she is status post DCCV to NSR 5/18. INR is therapeutic at a candidate for normal agents due to acute renal failure. Continue further management per cardiology, Coreg has been increased.  Acute on chronic systolic and diastolic heart failure: Appears to be more likely right-sided, renal was consulted and has been increasing diuretics Weight continues to improve nicely, the last 2-D echo showed an EF of 35% with a grade 3 diastolic heart failure. She is now on hydralazine and Imdur.  Acute on chronic kidney disease stage II: Renal ultrasound performed this admission show no hydronephrosis. Further regimen of diuretics per renal.  Hyponatremia Was Improving with diuresis.Today it has slowed down now 131, will continue daily checks.  Essential hypertension: ACE inhibitor was held BiDil was started Coreg has been titrated up.  Uncontrolled diabetes mellitus with renal complications  Blood glucose is running high we'll start low-dose insulin. Consult diabetes educator   DVT prophylaxis: Coumadin Family Communication:none Disposition Plan/Barrier to D/C: Once patient is euvolemic. Code Status:     Code Status Orders        Start     Ordered   07/30/15 1406  Full code   Continuous     07/30/15 1405    Code Status History    Date Active Date Inactive Code Status Order ID Comments User Context   This patient has a current code status but no historical code status.        IV Access:     Peripheral IV   Procedures and diagnostic studies:   No results found.   Medical Consultants:    None.  Anti-Infectives:   none  Subjective:    John Bailey has no new complaints.  Objective:    Filed Vitals:   08/06/15 0548 08/06/15 1601 08/06/15 2126 08/07/15 0456  BP: 136/87 141/95 150/106 153/100  Pulse: 89 81 95 88  Temp: 98.2 F (36.8 C) 97.9 F (36.6 C) 98.4 F (36.9 C) 98 F (36.7 C)  TempSrc: Oral Oral Oral Oral  Resp: 20 20 18 18   Height:      Weight: 138.483 kg (305 lb 4.8 oz)   137.803 kg (303 lb 12.8 oz)  SpO2: 92% 99% 97% 97%    Intake/Output Summary (Last 24 hours) at 08/07/15 0947 Last data filed at 08/07/15 0457  Gross per 24 hour  Intake    240 ml  Output   4500 ml  Net  -4260 ml   Filed Weights   08/04/15 0601 08/06/15 0548 08/07/15 0456  Weight: 139.39 kg (307 lb 4.8 oz) 138.483 kg (305 lb 4.8 oz) 137.803 kg (303 lb 12.8 oz)    Exam: General exam: In no acute distress. Respiratory system: Good air movement and clear to auscultation. Cardiovascular system: S1 & S2 heard, RRR.  Gastrointestinal system: Abdomen is nondistended, soft and nontender.  Central nervous system: Alert and oriented. No focal neurological deficits. Extremities: 3+ pedal edema Skin: No rashes, lesions or ulcers Psychiatry: Judgement and insight appear normal. Mood & affect appropriate.  Data Reviewed:    Labs: Basic Metabolic Panel:  Recent Labs Lab 08/03/15 0513 08/04/15 0549 08/05/15 0454 08/06/15 0500 08/07/15 0525  NA 132* 135 137 134* 131*  K 4.8 4.8 4.6 4.5 4.0  CL 96* 97* 97* 94* 92*  CO2 25 28 30 31 29   GLUCOSE 162* 211* 175* 204* 264*  BUN 66* 62* 59* 68* 62*  CREATININE 2.35* 2.24* 2.27* 2.53* 2.25*  CALCIUM 8.6* 8.8* 9.4 8.9 8.9   GFR Estimated Creatinine Clearance: 60.7 mL/min (by C-G formula based on Cr of 2.25). Liver Function Tests: No results for input(s): AST, ALT, ALKPHOS, BILITOT, PROT, ALBUMIN in the last  168 hours. No results for input(s): LIPASE, AMYLASE in the last 168 hours. No results for input(s): AMMONIA in the last 168 hours. Coagulation profile  Recent Labs Lab 08/03/15 0513 08/04/15 0549 08/05/15 0454 08/06/15 0524 08/07/15 0525  INR 1.55* 1.85* 2.16* 2.85* 3.12*    CBC:  Recent Labs Lab 08/02/15 0739 08/03/15 0513 08/04/15 0549 08/05/15 0454 08/07/15 0525  WBC 7.6 5.9 8.1 7.8 8.7  HGB 12.9* 13.3 12.6* 13.1 12.8*  HCT 38.0* 38.7* 37.7* 39.5 37.7*  MCV 87.0 86.0 88.1 89.4 87.9  PLT 395 PLATELET CLUMPS NOTED ON SMEAR, UNABLE TO ESTIMATE 362 362 383   Cardiac Enzymes: No results for input(s): CKTOTAL, CKMB, CKMBINDEX, TROPONINI in the last 168 hours. BNP (last 3 results) No results for input(s): PROBNP in the last 8760 hours. CBG:  Recent Labs Lab 08/06/15 0730 08/06/15 1202 08/06/15 1645 08/06/15 2127 08/07/15 0824  GLUCAP 177* 282* 141* 241* 216*   D-Dimer: No results for input(s): DDIMER in the last 72 hours. Hgb A1c: No results for input(s): HGBA1C in the last 72 hours. Lipid Profile: No results for input(s): CHOL, HDL, LDLCALC, TRIG, CHOLHDL, LDLDIRECT in the last 72 hours. Thyroid function studies: No results for input(s): TSH, T4TOTAL, T3FREE, THYROIDAB in the last 72 hours.  Invalid input(s): FREET3 Anemia work up: No results for input(s): VITAMINB12, FOLATE, FERRITIN, TIBC, IRON, RETICCTPCT in the last 72 hours. Sepsis Labs:  Recent Labs Lab 08/03/15 0513 08/04/15 0549 08/05/15 0454 08/07/15 0525  WBC 5.9 8.1 7.8 8.7   Microbiology Recent Results (from the past 240 hour(s))  MRSA PCR Screening     Status: None   Collection Time: 07/30/15  2:30 PM  Result Value Ref Range Status   MRSA by PCR NEGATIVE NEGATIVE Final    Comment:        The GeneXpert MRSA Assay (FDA approved for NASAL specimens only), is one component of a comprehensive MRSA colonization surveillance program. It is not intended to diagnose MRSA infection nor  to guide or monitor treatment for MRSA infections.      Medications:   . carvedilol  25 mg Oral BID WC  . furosemide  80 mg Intravenous Q8H  . hydrALAZINE  25 mg Oral TID  . insulin aspart  0-20 Units Subcutaneous TID WC  . insulin aspart  0-5 Units Subcutaneous QHS  . isosorbide dinitrate  5 mg Oral TID  . pregabalin  50 mg Oral Daily  . sodium chloride flush  3 mL Intravenous Q12H  . warfarin  2.5 mg Oral ONCE-1800  . Warfarin - Pharmacist Dosing Inpatient   Does not apply q1800   Continuous Infusions:   Time spent:25 min   LOS: 8 days   Charlynne Cousins  [facility] Pager [phone]  *Please refer to [email or web address], password [email or web address] to get updated schedule on who will round  on this patient, as hospitalists switch teams weekly. If 7PM-7AM, please contact night-coverage at [email or web address], password [email or web address] for any overnight needs.  08/07/2015, 9:47 AM

## 2015-08-07 NOTE — Progress Notes (Signed)
Inpatient Diabetes Program Recommendations  AACE/ADA: New Consensus Statement on Inpatient Glycemic Control (2015)  Target Ranges:  Prepandial:   less than 140 mg/dL      Peak postprandial:   less than 180 mg/dL (1-2 hours)      Critically ill patients:  140 - 180 mg/dL   Review of Glycemic Control  Diabetes history: DM 2 Outpatient Diabetes medications: Amaryl 4 mg daily and metformin 500 mg Current orders for Inpatient glycemic control: Resistant correction tidwc and HS scale and levemir to begin tonight at 5 units.  Inpatient Diabetes Program Recommendations:    Consult received for patient new to insulin. Noted levemir 5 units ordered to start tonight. Patient presently with acute on chronic renal disease. Patient is requiring high doses of correction insulin. May want to start novolog meal coverage of 5 units novolog tidwc and lower correction scale to moderate and HS. Patient may well benefit from OP dm education for heart, dm and renal nutrition. Will order education regarding signs and symptoms of low and high blood sugars and diet. Unsure if patient will actually be discharged on insulin with renal disease. Will order insulin pen starter kit in that case. Patient has PCP, Dr [name] to follow.  Will follow and assist as needed.  Thank you Rosita Kea, RN, MSN, CDE  Diabetes Inpatient Program Office: [phone] Pager: [phone] 8:00 am to 5:00 pm

## 2015-08-07 NOTE — Progress Notes (Signed)
  [facility] Progress Note   Subjective: 4.5  liters of UOP yest- kidney function stable  Filed Vitals:   08/06/15 0548 08/06/15 1601 08/06/15 2126 08/07/15 0456  BP: 136/87 141/95 150/106 153/100  Pulse: 89 81 95 88  Temp: 98.2 F (36.8 C) 97.9 F (36.6 C) 98.4 F (36.9 C) 98 F (36.7 C)  TempSrc: Oral Oral Oral Oral  Resp: _0 Height:      Weight: 138.483 kg (305 lb 4.8 oz)   137.803 kg (303 lb 12.8 oz)  SpO2: 92% 99% 97% 97%    Inpatient medications: . carvedilol  25 mg Oral BID WC  . furosemide  80 mg Intravenous Q8H  . hydrALAZINE  25 mg Oral TID  . insulin aspart  0-20 Units Subcutaneous TID WC  . insulin aspart  0-5 Units Subcutaneous QHS  . insulin detemir  5 Units Subcutaneous Daily  . isosorbide dinitrate  5 mg Oral TID  . pregabalin  50 mg Oral Daily  . sodium chloride flush  3 mL Intravenous Q12H  . warfarin  2.5 mg Oral ONCE-1800  . Warfarin - Pharmacist Dosing Inpatient   Does not apply q1800     acetaminophen, ondansetron **OR** ondansetron (ZOFRAN) IV, oxyCODONE  Exam: Alert , no distress No jvd Chest clear bilat RRR no MRG Abd soft ntnd no mass or ascites +bs markedly obese MS no joint effusions or deformity Ext 2+ bilat LE edema mostly below the knees / no wounds or ulcers Neuro is alert, Ox 3 , nf  Date  CreateGFR Mar 2014 1.24 May 2014 1.28 Jul 30, 2015  2.85 May 173.10 May 182.9129  UA - negative CXR - clear Renal US - normal size/ echo, no hydro ECHO - LVEF 35-40%, G3DD, severe conc LVH, severe LAE, RV fxn reduced, PAP mild ^'d HIV neg Urine PCR - 1.1 gm/ 24hr  Assessment: 1. 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 New presentation.   Serologies negative.  Cont to diurese.  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 +Proteinuria but not dramatic (1.1 mg/mgCr) so this is  not nephrotic syndrome.  2. Bivent CHF / severe LVH and G4DD / LVEF 35-40% this admission- down over 10 kg.  In preparation for eventual discharge I will change him over to oral lasix- will do 160 BID  3. HTN - a little better, on coreg/ hydral as well as lasix.  I still think the volume is the major player in his BP and may take weeks/months to get that where we need it-  4. AFib with RVR - in NSR now, on coumadin  5. Anemia- not an issue  6. Hyponatremia- thought to be due to volume overload- not sure why is trending down with volume removal   Darlyn Repsher A   08/07/2015, 12:14 PM    Recent Labs Lab 08/05/15 0454 08/06/15 0500 08/07/15 0525  NA 137 134* 131*  K 4.6 4.5 4.0  CL 97* 94* 92*  CO2 _1 GLUCOSE 175* 204* 264*  BUN 59* 68* 62*  CREATININE 2.27* 2.53* 2.25*  CALCIUM 9.4 8.9 8.9   No results for input(s): AST, ALT, ALKPHOS, BILITOT, PROT, ALBUMIN in the last 168 hours.  Recent Labs Lab 08/04/15 0549 08/05/15 0454 08/07/15 0525  WBC 8.1 7.8 8.7  HGB 12.6* 13.1 12.8*  HCT 37.7* 39.5 37.7*  MCV 88.1 89.4 87.9  PLT 362 362 383

## 2015-08-08 ENCOUNTER — Telehealth: Payer: Self-pay | Admitting: Internal Medicine

## 2015-08-08 DIAGNOSIS — E785 Hyperlipidemia, unspecified: Secondary | ICD-10-CM

## 2015-08-08 DIAGNOSIS — E1129 Type 2 diabetes mellitus with other diabetic kidney complication: Secondary | ICD-10-CM

## 2015-08-08 DIAGNOSIS — E1122 Type 2 diabetes mellitus with diabetic chronic kidney disease: Secondary | ICD-10-CM

## 2015-08-08 DIAGNOSIS — R0600 Dyspnea, unspecified: Secondary | ICD-10-CM

## 2015-08-08 DIAGNOSIS — I110 Hypertensive heart disease with heart failure: Secondary | ICD-10-CM

## 2015-08-08 DIAGNOSIS — I509 Heart failure, unspecified: Secondary | ICD-10-CM

## 2015-08-08 DIAGNOSIS — E875 Hyperkalemia: Secondary | ICD-10-CM

## 2015-08-08 DIAGNOSIS — I10 Essential (primary) hypertension: Secondary | ICD-10-CM

## 2015-08-08 DIAGNOSIS — R0602 Shortness of breath: Secondary | ICD-10-CM

## 2015-08-08 DIAGNOSIS — N182 Chronic kidney disease, stage 2 (mild): Secondary | ICD-10-CM

## 2015-08-08 LAB — CBC
HCT: 37.8 % — ABNORMAL LOW (ref 39.0–52.0)
HEMOGLOBIN: 12.4 g/dL — AB (ref 13.0–17.0)
MCH: 29 pg (ref 26.0–34.0)
MCHC: 32.8 g/dL (ref 30.0–36.0)
MCV: 88.3 fL (ref 78.0–100.0)
PLATELETS: 363 10*3/uL (ref 150–400)
RBC: 4.28 MIL/uL (ref 4.22–5.81)
RDW: 14.4 % (ref 11.5–15.5)
WBC: 8.5 10*3/uL (ref 4.0–10.5)

## 2015-08-08 LAB — BASIC METABOLIC PANEL
ANION GAP: 9 (ref 5–15)
BUN: 66 mg/dL — ABNORMAL HIGH (ref 6–20)
CHLORIDE: 95 mmol/L — AB (ref 101–111)
CO2: 30 mmol/L (ref 22–32)
Calcium: 8.6 mg/dL — ABNORMAL LOW (ref 8.9–10.3)
Creatinine, Ser: 2.26 mg/dL — ABNORMAL HIGH (ref 0.61–1.24)
GFR, EST AFRICAN AMERICAN: 39 mL/min — AB (ref >60)
GFR, EST NON AFRICAN AMERICAN: 34 mL/min — AB (ref >60)
Glucose, Bld: 274 mg/dL — ABNORMAL HIGH (ref 65–99)
POTASSIUM: 4.3 mmol/L (ref 3.5–5.1)
SODIUM: 134 mmol/L — AB (ref 135–145)

## 2015-08-08 LAB — PROTIME-INR
INR: 2.87 — ABNORMAL HIGH (ref 0.00–1.49)
PROTHROMBIN TIME: 29.6 s — AB (ref 11.6–15.2)

## 2015-08-08 LAB — GLUCOSE, CAPILLARY: GLUCOSE-CAPILLARY: 201 mg/dL — AB (ref 65–99)

## 2015-08-08 MED ORDER — ISOSORBIDE DINITRATE 5 MG PO TABS
5.0000 mg | ORAL_TABLET | Freq: Three times a day (TID) | ORAL | Status: DC
Start: 2015-08-08 — End: 2015-08-14

## 2015-08-08 MED ORDER — INSULIN PEN NEEDLE 31G X 6 MM MISC: 1.0000 | Freq: Two times a day (BID) | Status: DC

## 2015-08-08 MED ORDER — CARVEDILOL 25 MG PO TABS
25.0000 mg | ORAL_TABLET | Freq: Two times a day (BID) | ORAL | Status: DC
Start: 2015-08-08 — End: 2015-08-19

## 2015-08-08 MED ORDER — INSULIN GLARGINE 100 UNIT/ML SOLOSTAR PEN: 10.0000 [IU] | PEN_INJECTOR | Freq: Every day | SUBCUTANEOUS | Status: DC

## 2015-08-08 MED ORDER — FUROSEMIDE 80 MG PO TABS: 160.0000 mg | ORAL_TABLET | Freq: Two times a day (BID) | ORAL | Status: DC

## 2015-08-08 MED ORDER — WARFARIN SODIUM 5 MG PO TABS: 7.5000 mg | ORAL_TABLET | Freq: Every day | ORAL | Status: DC

## 2015-08-08 MED ORDER — INSULIN ASPART 100 UNIT/ML CARTRIDGE (PENFILL): 5.0000 [IU] | Freq: Three times a day (TID) | SUBCUTANEOUS | Status: DC

## 2015-08-08 NOTE — Telephone Encounter (Signed)
New message     TCM appt on 5.31.2017 @ 11:30 per Rosaria Ferries.

## 2015-08-08 NOTE — Telephone Encounter (Signed)
Patient contacted regarding discharge from [facility] on 08/01/15.  Patient understands to follow up with provider Dr Debara Pickett on 08/14/15 at 11:30am at [facility] location. Patient understands discharge instructions? yes  Patient understands medications and regiment? yes Patient understands to bring all medications to this visit? yes

## 2015-08-08 NOTE — Progress Notes (Addendum)
Patient Name: John Bailey Date of Encounter: 08/08/2015  Active Problems:   Hyponatremia   AKI (acute kidney injury) ([place])   Hyperkalemia   Chronic diastolic CHF (congestive heart failure) ([place])   Morbid obesity ([place])   Paroxysmal atrial fibrillation ([facility])   Atrial flutter with rapid ventricular response ([place])   Diabetes mellitus with renal complications [facility])   Primary Cardiologist: Dr Debara Pickett  Patient Profile: 45 yo male w/ hx HTN, D-CHF (NL MV 2014), admitted 05/16 w/ afib/RVR>>TEE/DCCV>>SR and CHF, EF now 35-40%, ?tachycardia induced CM.   SUBJECTIVE: Breathing great, going home and feels well. Determined to do well with instructions, meds and f/u.  OBJECTIVE Filed Vitals:   08/07/15 0456 08/07/15 1305 08/07/15 2151 08/08/15 0455  BP: 153/100 119/76 138/97 142/95  Pulse: 88 78 89 82  Temp: 98 F (36.7 C) 97.6 F (36.4 C) 98 F (36.7 C) 98.2 F (36.8 C)  TempSrc: Oral Oral Oral Oral  Resp: 18 20 20 20   Height:      Weight: 303 lb 12.8 oz (137.803 kg)   301 lb 6.4 oz (136.714 kg)  SpO2: 97% 99% 99% 98%    Intake/Output Summary (Last 24 hours) at 08/08/15 0815 Last data filed at 08/08/15 0700  Gross per 24 hour  Intake    360 ml  Output   3250 ml  Net  -2890 ml   Filed Weights   08/06/15 0548 08/07/15 0456 08/08/15 0455  Weight: 305 lb 4.8 oz (138.483 kg) 303 lb 12.8 oz (137.803 kg) 301 lb 6.4 oz (136.714 kg)    PHYSICAL EXAM General: Well developed, well nourished, male in no acute distress. Head: Normocephalic, atraumatic.  Neck: Supple without bruits, JVD not elevated. Lungs:  Resp regular and unlabored, CTA. Heart: RRR, S1, S2, no S3, S4, or murmur; no rub. Abdomen: Soft, non-tender, non-distended, BS + x 4.  Extremities: No clubbing, cyanosis, 1+ edema.  Neuro: Alert and oriented X 3. Moves all extremities spontaneously. Psych: Normal affect.  LABS: CBC: Recent Labs  08/07/15 0525 08/08/15 0448  WBC 8.7 8.5  HGB 12.8* 12.4*  HCT  37.7* 37.8*  MCV 87.9 88.3  PLT 383 363   INR: Recent Labs  08/08/15 0448  INR Q000111Q*   Basic Metabolic Panel: Recent Labs  08/07/15 0525 08/08/15 0448  NA 131* 134*  K 4.0 4.3  CL 92* 95*  CO2 29 30  GLUCOSE 264* 274*  BUN 62* 66*  CREATININE 2.25* 2.26*  CALCIUM 8.9 8.6*    BNP:  B NATRIURETIC PEPTIDE  Date/Time Value Ref Range Status  07/30/2015 01:53 PM 309.3* 0.0 - 100.0 pg/mL Final    Current Medications:  . carvedilol  25 mg Oral BID WC  . furosemide  160 mg Oral BID  . hydrALAZINE  25 mg Oral TID  . insulin aspart  0-20 Units Subcutaneous TID WC  . insulin aspart  0-5 Units Subcutaneous QHS  . insulin detemir  5 Units Subcutaneous Daily  . isosorbide dinitrate  5 mg Oral TID  . pregabalin  50 mg Oral Daily  . sodium chloride flush  3 mL Intravenous Q12H  . Warfarin - Pharmacist Dosing Inpatient   Does not apply q1800      ASSESSMENT AND PLAN: 1. Atrial Fib/Flutter, RVR:  Status post TEE [facility] maintaining NSR lovenox>>coumadin, INR 3.12 today. Off lovenox. Coreg 25 mg bid for HTN/rate.  BP still elevated and HR >75, continue Coreg at 25 mg bid  2. CHF:  Net fluids: -2.8L/-30.7L total. Wt down 27 lbs since admit Diastolic and rate related. EF 50-55% May 2014 but EF 35-40% 05/17, before DCCV. Restrictive filling on diastolic Q000111Q. LA severely dilated. Mild MR. Moderate TR. PAS 72mmHg. Lasix dosing per renal. Good output over night and since adm. No ACE/ARB/Entresto due to A/CRF   He still has LEE. Continue Lasix, dosing per renal.  Says he can be compliant with TID meds  3. CRF: BUN/Cr 68/2.53. Cr 3.1 on admit. continue diuresis, plan per nephrology   4. HTN: Coreg increased to 25 mg bid 05/24, hydralazine 25 mg tid since 05/21. Further med titration as outpatient.  Othwerwise, per IM. OK w/ cards to d/c. Pt requests f/u with Dr Debara Pickett, this was arranged. Active Problems:   Hyponatremia   AKI (acute kidney injury) ([place])    Hyperkalemia   Chronic diastolic CHF (congestive heart failure) (HCC)   Morbid obesity (HCC)   Paroxysmal atrial fibrillation (HCC)   Atrial flutter with rapid ventricular response ([place])   Diabetes mellitus with renal complications ([place])   Signed, Rosaria Ferries , PA-C 8:15 AM 08/08/2015

## 2015-08-08 NOTE — Progress Notes (Addendum)
Inpatient Diabetes Program Recommendations  AACE/ADA: New Consensus Statement on Inpatient Glycemic Control (2015)  Target Ranges:  Prepandial:   less than 140 mg/dL      Peak postprandial:   less than 180 mg/dL (1-2 hours)      Critically ill patients:  140 - 180 mg/dL   Results for John Bailey, John Bailey (MRN [id number]) as of 08/08/2015 10:56  Ref. Range 08/07/2015 08:24 08/07/2015 12:04 08/07/2015 16:33 08/07/2015 21:44  Glucose-Capillary Latest Ref Range: 65-99 mg/dL 216 (H) 128 (H) 214 (H) 200 (H)   Results for John Bailey, John Bailey (MRN [id number]) as of 08/08/2015 10:56  Ref. Range 07/31/2015 01:50  Hemoglobin A1C Latest Ref Range: 4.8-5.6 % 9.7 (H)    Home DM Meds: Amaryl 4 mg daily       Metformin 500 mg daily  Current Insulin Orders: Levemir 5 units daily      Novolog Resistant Correction Scale/ SSI (0-20 units) TID AC + HS      -Note MD plans to discontinue Amaryl and Metformin from patient's home DM medication regimen.  -MD has given patient Rxs for Lantus 10 units QHS and Novolog 5 units tidwc with close follow-up with his PCP, Dr. [name].  -Patient was given Insulin pen starter kit and RNs were asked to begin insulin instruction with patient yesterday.    Educated patient on insulin pen use at home.  Reviewed contents of insulin flexpen starter kit.  Reviewed all steps of insulin pen including attachment of needle, 2-unit air shot, dialing up dose, giving injection, removing needle, disposal of sharps, storage of unused insulin, disposal of insulin etc.  Patient able to provide successful return demonstration.  Reviewed troubleshooting with insulin pen.  Reviewed how/when to take Lantus and how/when to take Novolog.  Also reviewed Signs/Symptoms of Hypoglycemia with patient and how to treat Hypoglycemia at home.  Have asked RNs caring for patient to please allow patient to give all injections here in hospital as much as possible for practice.  MD to give patient Rxs for insulin pens and  insulin pen needles.     --Will follow patient during hospitalization--  Wyn Quaker RN, MSN, CDE Diabetes Coordinator Inpatient Glycemic Control Team Team Pager: [phone] (8a-5p)

## 2015-08-08 NOTE — Discharge Summary (Addendum)
Physician Discharge Summary  John Bailey [id number] DOB: [birth year] DOA: 07/30/2015  PCP: Barbette Merino, MD  Admit date: 07/30/2015 Discharge date: 08/08/2015  Time spent: 35 minutes  Recommendations for Outpatient Follow-up:  1. Follow-up with primary care doctor in 2 weeks titrate insulin as tolerated. 2. Follow-up with nephrology check his weight and titrate his diuretics as tolerated. 3. Follow-up with the diabetes patient educator as an outpatient. 4. We'll follow-up with the Coumadin clinic next week.   Discharge Diagnoses:  Active Problems:   Hyponatremia   AKI (acute kidney injury) ([place])   Paroxysmal atrial fibrillation (HCC)   Hyperkalemia   Chronic diastolic CHF (congestive heart failure) (HCC)   Morbid obesity (HCC)   Atrial flutter with rapid ventricular response ([place])   Insulin-dependent diabetes mellitus with renal complications [facility])   Discharge Condition: stable  Diet recommendation: carb modified  Filed Weights   08/06/15 0548 08/07/15 0456 08/08/15 0455  Weight: 138.483 kg (305 lb 4.8 oz) 137.803 kg (303 lb 12.8 oz) 136.714 kg (301 lb 6.4 oz)    History of present illness:  45 year old with past medical history of morbid obesity chronic diastolic heart failure with a grade 1 diastolic dysfunction the presented to his primary care office because of generalized weakness of the past several weeks and dyspnea on exertion he is found to be in A. fib with RVR  Hospital Course:  A. fib with RVR: Likely due to volume overload he is status post DCCV the normal sinus rhythm about 18 2017, INR was therapeutic. Repeat a 2-D echo showed ejection fraction of 35% with a grade 3 diastolic heart failure His beta blocker was titrated and his heart rate improved. He'll follow-up with cardiology in 2-4 weeks.  Acute on chronic systolic and diastolic heart failure: He is not a candidate for ACE inhibitor due to his renal function, he was started on aggressive IV  diuresis cardiology was consulted which agree with management and recommended to consult renal for fluid management. His beta blocker was titrated up and he was started on hydralazine and Imdur, his ACE inhibitor was DC'd.  Acute on chronic disease stage II: Renal Ultrasound was performed and show no hydronephrosis, renal was consulted recommended high-dose diuretics he diabetes over 25 pounds he was discharged home on Lasix 25 mg 3 times a day and will follow-up with nephrology as an outpatient will check a basic metabolic panel at this time.  Hyponatremia: Likely due to volume overload it has improved to 134 with IV diuresis.  Essential hypertension: ACE inhibitor was DC'd due to chronic renal disease, he was started on BiDil Coreg was titrated up was he tolerated fairly well.  Uncontrolled diabetes mellitus with renal complications: His metformin and Amaryl were stopped, he was started on long-acting and short acting insulin with meals follow-up with primary care doctor and will titrate medications as tolerated. The diabetes educator was consulted and he was given the proper education of insulin use.   Procedures:  Cardioversion performed 08/01/2015  Renal ultrasound  Chest x-ray  2-D echo and TTE  Consultations:  Cardiology  Nephrology  Discharge Exam: Filed Vitals:   08/07/15 2151 08/08/15 0455  BP: 138/97 142/95  Pulse: 89 82  Temp: 98 F (36.7 C) 98.2 F (36.8 C)  Resp: 20 20    General: A&O x3 Cardiovascular: RRR Respiratory: good air movement CTA B/L  Discharge Instructions   Discharge Instructions    Ambulatory referral to Nutrition and Diabetic Education    Complete by:  As  directed   HgbA1C of 9.7% with renal and heart complications.     Diet - low sodium heart healthy    Complete by:  As directed      For home use only DME Glucometer    Complete by:  As directed      Increase activity slowly    Complete by:  As directed      PR BLOOD  GLUCOSE/REAGENT STRIPS    Complete by:  As directed      PR LANCETS PER BOX    Complete by:  As directed           Current Discharge Medication List    START taking these medications   Details  insulin aspart (NOVOLOG PENFILL) cartridge Inject 5 Units into the skin 3 (three) times daily with meals. Qty: 15 mL, Refills: 11    Insulin Glargine (LANTUS) 100 UNIT/ML Solostar Pen Inject 10 Units into the skin daily at 10 pm. Qty: 15 mL, Refills: 0    Insulin Pen Needle 31G X 6 MM MISC 1 Device by Does not apply route 2 (two) times daily. Qty: 60 each, Refills: 3    isosorbide dinitrate (ISORDIL) 5 MG tablet Take 1 tablet (5 mg total) by mouth 3 (three) times daily. Qty: 30 tablet, Refills: 0    warfarin (COUMADIN) 5 MG tablet Take 1.5 tablets (7.5 mg total) by mouth daily. Qty: 30 tablet, Refills: 0      CONTINUE these medications which have CHANGED   Details  carvedilol (COREG) 25 MG tablet Take 1 tablet (25 mg total) by mouth 2 (two) times daily with a meal. Qty: 60 tablet, Refills: 3    furosemide (LASIX) 80 MG tablet Take 2 tablets (160 mg total) by mouth 2 (two) times daily. Qty: 30 tablet, Refills: 0      CONTINUE these medications which have NOT CHANGED   Details  albuterol (PROVENTIL HFA;VENTOLIN HFA) 108 (90 BASE) MCG/ACT inhaler Inhale 1-2 puffs into the lungs every 6 (six) hours as needed for wheezing. Qty: 1 Inhaler, Refills: 3    aspirin EC 81 MG tablet Take 81 mg by mouth daily.    chlorpheniramine-HYDROcodone (TUSSIONEX PENNKINETIC ER) 10-8 MG/5ML SUER Take 5 mLs by mouth 2 (two) times daily as needed for cough.    fluticasone (FLONASE) 50 MCG/ACT nasal spray Place 2 sprays into the nose daily. Qty: 16 g, Refills: 2    Multiple Vitamin (MULTIVITAMIN WITH MINERALS) TABS tablet Take 2 tablets by mouth daily.    potassium chloride SA (KLOR-CON M20) 20 MEQ tablet Take 1 tablet (20 mEq total) by mouth daily. Qty: 90 tablet, Refills: 3      STOP taking these  medications     glimepiride (AMARYL) 4 MG tablet      lisinopril-hydrochlorothiazide (PRINZIDE,ZESTORETIC) 20-25 MG per tablet      metFORMIN (GLUCOPHAGE) 500 MG tablet      spironolactone (ALDACTONE) 25 MG tablet      azithromycin (ZITHROMAX) 250 MG tablet      methylPREDNISolone (MEDROL DOSEPAK) 4 MG tablet        No Known Allergies Follow-up Information    Follow up with GARBA,LAWAL, MD In 2 weeks.   Specialty:  Internal Medicine   Why:  Hospital follow-up monitor CBG sedimentation rate insulin as tolerated.   Contact information:   [address] [place] [address] [phone]        The results of significant diagnostics from this hospitalization (including imaging, microbiology, ancillary and  laboratory) are listed below for reference.    Significant Diagnostic Studies: US Renal  07/31/2015  CLINICAL DATA:  Acute renal insufficiency EXAM: RENAL ULTRASOUND COMPARISON:  None. FINDINGS: Right Kidney: Length: 12.5 cm. Echogenicity and renal cortical thickness are within normal limits. No mass, perinephric fluid, or hydronephrosis visualized. No sonographically demonstrable calculus or ureterectasis. Left Kidney: Length: 12.0 cm. Echogenicity and renal cortical thickness are within normal limits. No perinephric fluid or hydronephrosis visualized. There is a cyst arising from the upper pole left kidney, measuring 2.3 x 1.9 x 1.7 cm. No sonographically demonstrable calculus or ureterectasis. Bladder: Empty and cannot be assessed at this time. IMPRESSION: Small cyst upper pole left kidney.  Study otherwise unremarkable. Electronically Signed   By: Lowella Grip III M.D.   On: 07/31/2015 13:25   Dg Chest Port 1 View  07/30/2015  CLINICAL DATA:  Shortness of breath.  History of hypertension. EXAM: PORTABLE CHEST 1 VIEW COMPARISON:  12/29/2012 FINDINGS: Mild to moderate enlargement of the cardiopericardial silhouette, stable. No mediastinal or hilar masses or evidence of  adenopathy. Clear lungs.  No convincing pleural effusion or pneumothorax. Skeletal structures are unremarkable. IMPRESSION: 1. No acute cardiopulmonary disease. 2. Mild to moderate cardiomegaly. Electronically Signed   By: Lajean Manes M.D.   On: 07/30/2015 14:07    Microbiology: Recent Results (from the past 240 hour(s))  MRSA PCR Screening     Status: None   Collection Time: 07/30/15  2:30 PM  Result Value Ref Range Status   MRSA by PCR NEGATIVE NEGATIVE Final    Comment:        The GeneXpert MRSA Assay (FDA approved for NASAL specimens only), is one component of a comprehensive MRSA colonization surveillance program. It is not intended to diagnose MRSA infection nor to guide or monitor treatment for MRSA infections.      Labs: Basic Metabolic Panel:  Recent Labs Lab 08/04/15 0549 08/05/15 0454 08/06/15 0500 08/07/15 0525 08/08/15 0448  NA 135 137 134* 131* 134*  K 4.8 4.6 4.5 4.0 4.3  CL 97* 97* 94* 92* 95*  CO2 28 30 31 29 30   GLUCOSE 211* 175* 204* 264* 274*  BUN 62* 59* 68* 62* 66*  CREATININE 2.24* 2.27* 2.53* 2.25* 2.26*  CALCIUM 8.8* 9.4 8.9 8.9 8.6*   Liver Function Tests: No results for input(s): AST, ALT, ALKPHOS, BILITOT, PROT, ALBUMIN in the last 168 hours. No results for input(s): LIPASE, AMYLASE in the last 168 hours. No results for input(s): AMMONIA in the last 168 hours. CBC:  Recent Labs Lab 08/03/15 0513 08/04/15 0549 08/05/15 0454 08/07/15 0525 08/08/15 0448  WBC 5.9 8.1 7.8 8.7 8.5  HGB 13.3 12.6* 13.1 12.8* 12.4*  HCT 38.7* 37.7* 39.5 37.7* 37.8*  MCV 86.0 88.1 89.4 87.9 88.3  PLT PLATELET CLUMPS NOTED ON SMEAR, UNABLE TO ESTIMATE 362 362 383 363   Cardiac Enzymes: No results for input(s): CKTOTAL, CKMB, CKMBINDEX, TROPONINI in the last 168 hours. BNP: BNP (last 3 results)  Recent Labs  07/30/15 1353  BNP 309.3*    ProBNP (last 3 results) No results for input(s): PROBNP in the last 8760 hours.  CBG:  Recent Labs Lab  08/07/15 0824 08/07/15 1204 08/07/15 1633 08/07/15 2144 08/08/15 0748  GLUCAP 216* 128* 214* 200* 201*       Signed:  Charlynne Cousins MD.  [facility] 08/08/2015, 10:09 AM

## 2015-08-08 NOTE — Progress Notes (Signed)
[facility] Progress Note   Subjective: 3.2  liters of UOP yest- kidney function stable- planning for discharge today   Filed Vitals:   08/07/15 0456 08/07/15 1305 08/07/15 2151 08/08/15 0455  BP: 153/100 119/76 138/97 142/95  Pulse: 88 78 89 82  Temp: 98 F (36.7 C) 97.6 F (36.4 C) 98 F (36.7 C) 98.2 F (36.8 C)  TempSrc: Oral Oral Oral Oral  Resp: _0 Height:      Weight: 137.803 kg (303 lb 12.8 oz)   136.714 kg (301 lb 6.4 oz)  SpO2: 97% 99% 99% 98%    Inpatient medications: . carvedilol  25 mg Oral BID WC  . furosemide  160 mg Oral BID  . hydrALAZINE  25 mg Oral TID  . insulin aspart  0-20 Units Subcutaneous TID WC  . insulin aspart  0-5 Units Subcutaneous QHS  . insulin detemir  5 Units Subcutaneous Daily  . isosorbide dinitrate  5 mg Oral TID  . pregabalin  50 mg Oral Daily  . sodium chloride flush  3 mL Intravenous Q12H  . Warfarin - Pharmacist Dosing Inpatient   Does not apply q1800     acetaminophen, ondansetron **OR** ondansetron (ZOFRAN) IV, oxyCODONE  Exam: Alert , no distress No jvd Chest clear bilat RRR no MRG Abd soft ntnd no mass or ascites +bs markedly obese MS no joint effusions or deformity Ext 2+ bilat LE edema mostly below the knees / no wounds or ulcers Neuro is alert, Ox 3 , nf  Date  CreateGFR Mar 2014 1.24 May 2014 1.28 Jul 30, 2015  2.85 May 173.10 May 182.9129  UA - negative CXR - clear Renal US - normal size/ echo, no hydro ECHO - LVEF 35-40%, G3DD, severe conc LVH, severe LAE, RV fxn reduced, PAP mild ^'d HIV neg Urine PCR - 1.1 gm/ 24hr  Assessment: 1. 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 New presentation.   Serologies negative.  Cont to diurese.  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 +Proteinuria but not dramatic (1.1 mg/mgCr) so this is not nephrotic  syndrome. Crt now stable in the low 2's 2. Bivent CHF / severe LVH and G4DD / LVEF 35-40% this admission- down over 12 kg.  oral lasix- will do 160 BID  3. HTN - a little better, on coreg/ hydral as well as lasix.  I still think the volume is the major player in his BP and may take weeks/months to get that where we need it-  4. AFib with RVR - in NSR now, on coumadin  5. Anemia- not an issue  6. Hyponatremia- thought to be due to volume overload- not sure why is trending down with volume removal- better today  7. Renal is Ok with discharge- I am going to arrange bloodwork week after next and he has appt with me on June 22nd   [place] A   08/08/2015, 11:35 AM    Recent Labs Lab 08/06/15 0500 08/07/15 0525 08/08/15 0448  NA 134* 131* 134*  K 4.5 4.0 4.3  CL 94* 92* 95*  CO2 _1 GLUCOSE 204* 264* 274*  BUN 68* 62* 66*  CREATININE 2.53* 2.25* 2.26*  CALCIUM 8.9 8.9 8.6*   No results for input(s): AST, ALT, ALKPHOS, BILITOT, PROT, ALBUMIN in the last 168 hours.  Recent Labs Lab 08/05/15 0454 08/07/15 0525 08/08/15 0448  WBC 7.8 8.7 8.5  HGB 13.1 12.8* 12.4*  HCT 39.5  37.7* 37.8*  MCV 89.4 87.9 88.3  PLT 362 383 363

## 2015-08-09 ENCOUNTER — Telehealth: Payer: Self-pay | Admitting: Internal Medicine

## 2015-08-09 NOTE — Telephone Encounter (Signed)
Pt is called in stating that once he was release from the hospital , he was not given a machine to monitor his blood sugar. Please f/u with him.   Thanks

## 2015-08-09 NOTE — Telephone Encounter (Signed)
Spoke with pt, he was given a prescription when he left the hospital for a blood sugar machine and he is unsure where to go with that. Referred pt to his PCP or local pharmacy.

## 2015-08-14 ENCOUNTER — Ambulatory Visit (INDEPENDENT_AMBULATORY_CARE_PROVIDER_SITE_OTHER): Payer: BLUE CROSS/BLUE SHIELD | Admitting: Pharmacist

## 2015-08-14 ENCOUNTER — Ambulatory Visit (INDEPENDENT_AMBULATORY_CARE_PROVIDER_SITE_OTHER): Payer: BLUE CROSS/BLUE SHIELD | Admitting: Internal Medicine

## 2015-08-14 ENCOUNTER — Encounter: Payer: Self-pay | Admitting: Internal Medicine

## 2015-08-14 VITALS — BP 144/100 | HR 88 | Ht 71.0 in | Wt 280.0 lb

## 2015-08-14 DIAGNOSIS — I4892 Unspecified atrial flutter: Secondary | ICD-10-CM | POA: Diagnosis not present

## 2015-08-14 DIAGNOSIS — Z7901 Long term (current) use of anticoagulants: Secondary | ICD-10-CM | POA: Diagnosis not present

## 2015-08-14 DIAGNOSIS — I480 Paroxysmal atrial fibrillation: Secondary | ICD-10-CM | POA: Diagnosis not present

## 2015-08-14 DIAGNOSIS — N179 Acute kidney failure, unspecified: Secondary | ICD-10-CM

## 2015-08-14 DIAGNOSIS — I110 Hypertensive heart disease with heart failure: Secondary | ICD-10-CM

## 2015-08-14 DIAGNOSIS — I5041 Acute combined systolic (congestive) and diastolic (congestive) heart failure: Secondary | ICD-10-CM | POA: Diagnosis not present

## 2015-08-14 DIAGNOSIS — I429 Cardiomyopathy, unspecified: Secondary | ICD-10-CM | POA: Diagnosis not present

## 2015-08-14 DIAGNOSIS — I428 Other cardiomyopathies: Secondary | ICD-10-CM

## 2015-08-14 LAB — POCT INR: INR: 1.7

## 2015-08-14 MED ORDER — ISOSORB DINITRATE-HYDRALAZINE 20-37.5 MG PO TABS: 1.0000 | ORAL_TABLET | Freq: Three times a day (TID) | ORAL | Status: DC

## 2015-08-14 NOTE — Progress Notes (Signed)
OFFICE NOTE  Chief Complaint:  Hospital follow-up  Primary Care Physician: Barbette Merino, MD  HPI:  John Bailey is a 45 y.o. male patient, previously followed by Dr. Acie Fredrickson, who has requested to see me after he week-long hospitalization for volume overload, atrial flutter with RVR and acute systolic congestive heart failure - LVEF now 35%. He also developed acute kidney injury and is being followed by Dr. Moshe Cipro. Past medical history is also significant for insulin-dependent diabetes, morbid obesity, LVH and dyslipidemia. He was admitted for atrial flutter with rapid ventricular response and underwent TEE cardioversion on 08/01/2015 which showed global hypokinesis and EF 25-30%. Surface echo a day prior to that showed EF of 35-40%. He was successfully cardioverted and has maintained sinus rhythm since that time. He had marked volume overload and diuresed more than 25 pounds. He continues to lose weight in fact his discharge weight was 136 kg and is currently 127 kg. He denies chest pain or shortness of breath. His INR was tested today as he is on warfarin and found to be 1.7. Adjustments were made to his Coumadin. Blood pressure remains elevated.  PMHx:  Past Medical History  Diagnosis Date  . Hypertension   . Diabetes ([place])   . Atrial flutter with rapid ventricular response ([place]) 08/06/2015    Past Surgical History  Procedure Laterality Date  . Cholecystectomy    . Tee without cardioversion N/A 08/01/2015    Procedure: TRANSESOPHAGEAL ECHOCARDIOGRAM (TEE);  Surgeon: Josue Hector, MD;  [facility] END[facility]COPY;  Service: Cardiovascular;  Laterality: N/A;  . Cardioversion N/A 08/01/2015    Procedure: CARDIOVERSION;  Surgeon: Josue Hector, MD;  Location: [facility] ENDOSCOPY;  Service: Cardiovascular;  Laterality: N/A;    FAMHx:  Family History  Problem Relation Age of Onset  . Heart disease Mother   . Hyperlipidemia Father   . Hypertension Father     SOCHx:   reports that he  has never smoked. He has never used smokeless tobacco. He reports that he does not drink alcohol or use illicit drugs.  ALLERGIES:  No Known Allergies  ROS: Pertinent items noted in HPI and remainder of comprehensive ROS otherwise negative.  HOME MEDS: Current Outpatient Prescriptions  Medication Sig Dispense Refill  . albuterol (PROVENTIL HFA;VENTOLIN HFA) 108 (90 BASE) MCG/ACT inhaler Inhale 1-2 puffs into the lungs every 6 (six) hours as needed for wheezing. 1 Inhaler 3  . carvedilol (COREG) 25 MG tablet Take 1 tablet (25 mg total) by mouth 2 (two) times daily with a meal. 60 tablet 3  . chlorpheniramine-HYDROcodone (TUSSIONEX PENNKINETIC ER) 10-8 MG/5ML SUER Take 5 mLs by mouth 2 (two) times daily as needed for cough.    . fluticasone (FLONASE) 50 MCG/ACT nasal spray Place 2 sprays into the nose daily. 16 g 2  . furosemide (LASIX) 80 MG tablet Take 2 tablets (160 mg total) by mouth 2 (two) times daily. 30 tablet 0  . insulin aspart (NOVOLOG PENFILL) cartridge Inject 5 Units into the skin 3 (three) times daily with meals. 15 mL 11  . Insulin Glargine (LANTUS) 100 UNIT/ML Solostar Pen Inject 10 Units into the skin daily at 10 pm. 15 mL 0  . Insulin Pen Needle 31G X 6 MM MISC 1 Device by Does not apply route 2 (two) times daily. 60 each 3  . Multiple Vitamin (MULTIVITAMIN WITH MINERALS) TABS tablet Take 2 tablets by mouth daily.    . potassium chloride SA (KLOR-CON M20) 20 MEQ tablet Take 1 tablet (  20 mEq total) by mouth daily. 90 tablet 3  . warfarin (COUMADIN) 5 MG tablet Take 1.5 tablets (7.5 mg total) by mouth daily. 30 tablet 0  . isosorbide-hydrALAZINE (BIDIL) 20-37.5 MG tablet Take 1 tablet by mouth 3 (three) times daily. 90 tablet 5   No current facility-administered medications for this visit.    LABS/IMAGING: No results found for this or any previous visit (from the past 48 hour(s)). No results found.  WEIGHTS: Wt Readings from Last 3 Encounters:  08/14/15 280 lb (127.007  kg)  08/08/15 301 lb 6.4 oz (136.714 kg)  05/30/14 297 lb 12.8 oz (135.081 kg)    VITALS: BP 144/100 mmHg  Pulse 88  Ht 5\' 11"  (1.803 m)  Wt 280 lb (127.007 kg)  BMI 39.07 kg/m2  EXAM: General appearance: alert, no distress and morbidly obese Neck: no carotid bruit, no JVD and thyroid not enlarged, symmetric, no tenderness/mass/nodules Lungs: clear to auscultation bilaterally Heart: regular rate and rhythm, S1, S2 normal, no murmur, click, rub or gallop Abdomen: soft, non-tender; bowel sounds normal; no masses,  no organomegaly Extremities: edema 1+ tense edema Pulses: 2+ and symmetric Skin: Skin color, texture, turgor normal. No rashes or lesions Neurologic: Grossly normal Psych: Pleasant  EKG: Normal sinus rhythm at 88, right bundle branch block  ASSESSMENT: 1. Paroxysmal atrial flutter status post TEE cardioversion 2. 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 Chronic anticoagulation on warfarin 4. Acute kidney injury 5. Type 2 diabetes 6. Dyslipidemia 7. Hypertensive heart disease with heart failure  PLAN: 1.   Mr. Coopersmith is doing better after discharge. As mentioned he diuresed over 25 pounds and continues to lose weight on high-dose diuretics. This may be able to be reduced down to 80 mg twice a day, but I will defer this to his nephrologist who he plans to see in a few weeks. He is anticoagulated on warfarin and INR was 1.7 today we will adjust his warfarin dose and recheck it in 1 week. Blood pressure remains elevated. Due to his chronic kidney disease she's not a candidate for ACE, ARB or Entresto. Would continue carvedilol. We will discontinue isordil and start Bidil 20/37.5 mg TID. He can also stop aspirin since he is on warfarin.  Follow-up with me in 3 months. I will repeat and echo in 6 months.  This is a hospital TCM visit < 7 days post-discharge. As noted, the patient was contacted 08/08/2015 (same day of discharge) for discharge follow-up.  Pixie Casino,  MD, [facility] Attending Cardiologist [place] 08/14/2015, 12:26 PM

## 2015-08-14 NOTE — Patient Instructions (Addendum)
Your physician has requested that you have an echocardiogram in 6 months @ [address]. Echocardiography is a painless test that uses sound waves to create images of your heart. It provides your doctor with information about the size and shape of your heart and how well your heart's chambers and valves are working. This procedure takes approximately one hour. There are no restrictions for this procedure.  BP cuff - thigh size cuff - brand recommendation: Omron -- you can check a medical supply store  Your physician has recommended you make the following change in your medication...  1. STOP isordil  2. START bidil 20-37.5mg  three times daily (every 8 hours)  -- samples 3 bottles of 12 tablets provided to patient w/free 30 day voucher & co-pay card  -- LOTBO:[id number] P2  Exp: 04/2016  Your physician recommends that you schedule a follow-up appointment in [place] with Dr. Debara Pickett

## 2015-08-19 ENCOUNTER — Ambulatory Visit (INDEPENDENT_AMBULATORY_CARE_PROVIDER_SITE_OTHER): Payer: BLUE CROSS/BLUE SHIELD | Admitting: Pharmacist

## 2015-08-19 DIAGNOSIS — I480 Paroxysmal atrial fibrillation: Secondary | ICD-10-CM

## 2015-08-19 DIAGNOSIS — Z7901 Long term (current) use of anticoagulants: Secondary | ICD-10-CM

## 2015-08-19 DIAGNOSIS — I4892 Unspecified atrial flutter: Secondary | ICD-10-CM | POA: Diagnosis not present

## 2015-08-19 LAB — POCT INR: INR: 1.9

## 2015-08-19 MED ORDER — WARFARIN SODIUM 5 MG PO TABS: 7.5000 mg | ORAL_TABLET | Freq: Every day | ORAL | Status: DC

## 2015-08-19 MED ORDER — POTASSIUM CHLORIDE CRYS ER 20 MEQ PO TBCR: 20.0000 meq | EXTENDED_RELEASE_TABLET | Freq: Every day | ORAL | Status: DC

## 2015-08-19 MED ORDER — CARVEDILOL 25 MG PO TABS: 25.0000 mg | ORAL_TABLET | Freq: Two times a day (BID) | ORAL | Status: DC

## 2015-08-19 MED ORDER — FUROSEMIDE 80 MG PO TABS: 160.0000 mg | ORAL_TABLET | Freq: Two times a day (BID) | ORAL | Status: DC

## 2015-08-30 ENCOUNTER — Telehealth: Payer: Self-pay | Admitting: Internal Medicine

## 2015-08-30 NOTE — Telephone Encounter (Signed)
Prior authorization for Bidil submitted via [email or web address]

## 2015-09-02 ENCOUNTER — Ambulatory Visit (INDEPENDENT_AMBULATORY_CARE_PROVIDER_SITE_OTHER): Payer: BLUE CROSS/BLUE SHIELD | Admitting: Pharmacist

## 2015-09-02 DIAGNOSIS — I480 Paroxysmal atrial fibrillation: Secondary | ICD-10-CM

## 2015-09-02 DIAGNOSIS — Z7901 Long term (current) use of anticoagulants: Secondary | ICD-10-CM | POA: Diagnosis not present

## 2015-09-02 DIAGNOSIS — I4892 Unspecified atrial flutter: Secondary | ICD-10-CM

## 2015-09-02 LAB — POCT INR: INR: 1.7

## 2015-09-03 ENCOUNTER — Telehealth: Payer: Self-pay | Admitting: Internal Medicine

## 2015-09-03 NOTE — Telephone Encounter (Signed)
Received notification that patient's PA for Bidil has been denied - reason: isordil was tried but another formulary alternative also needs to be tried. Denial letter did not provide information about which other formulary alternatives are available.  "restricted access medications may be covered when two alternative medications on the member's formulary have been tried and did not work."  Questions r/t medications on prior auth form:  -- medications tried: isordil - ineffective; carvedilol - still taking  Will send message to MD & clinical pharmacy staff to review/asssit

## 2015-09-03 NOTE — Telephone Encounter (Signed)
John Bailey pt stated he need PA on this medication

## 2015-09-03 NOTE — Telephone Encounter (Signed)
Prior authorization was completed and denied. An appeal can be completed within 180 days of the denial letter date (09/03/15)  Message routed to clinical pharmacy staff for assistance

## 2015-09-03 NOTE — Telephone Encounter (Signed)
New message     *STAT* If patient is at the pharmacy, call can be transferred to refill team.   1. Which medications need to be refilled? (please list name of each medication and dose if known) Bidil 20-37.5 mg po daily  2. Which pharmacy/location (including street and city if local pharmacy) is medication to be sent to?[facility] on [address]. Do they need a 30 day or 90 day supply? Not provided in the call   The pt is almost out of the medication

## 2015-09-03 NOTE — Telephone Encounter (Signed)
There is no formulary alternative- short acting isosorbide and hydralazine is not comparable in studies. Will likely need to do a prior auth unless our talented pharmacists know how to get this covered?  Dr. Lemmie Evens

## 2015-09-04 MED ORDER — ISOSORB DINITRATE-HYDRALAZINE 20-37.5 MG PO TABS: 1.0000 | ORAL_TABLET | Freq: Three times a day (TID) | ORAL | Status: DC

## 2015-09-04 NOTE — Telephone Encounter (Signed)
Rx(s) sent to pharmacy electronically. See documentation in 08/30/15 tele note for PA status

## 2015-09-04 NOTE — Telephone Encounter (Signed)
After checking on samples of Bidil (none available) and speaking with K. Alvstad, PharmD about patient's case, she recommended contacting [facility] as they have a deal with the Bidil manufacturer to offer this Rx at a lower cost, despite insurance approval/denial.   [facility] and explained patient situation, and they informed me of prices of the medications  Up to 90 tablets/month = $40 91-180 tablets/month = $80  Contacted patient with the information regarding the denial from his insurance, Dr. Lysbeth Penner recommendations regarding the difference in Bidil and splitting the med in to two Rx, and proposed the option of [employer]. Patient states since MD would like him to stay on this medication, he can get the medication from this pharmacy. Patient notified of pharmacy name, location, and price of Rx.   Rx(s) sent to pharmacy electronically.

## 2015-09-05 ENCOUNTER — Emergency Department (HOSPITAL_BASED_OUTPATIENT_CLINIC_OR_DEPARTMENT_OTHER): Payer: BLUE CROSS/BLUE SHIELD

## 2015-09-05 ENCOUNTER — Encounter (HOSPITAL_BASED_OUTPATIENT_CLINIC_OR_DEPARTMENT_OTHER): Payer: Self-pay | Admitting: *Deleted

## 2015-09-05 ENCOUNTER — Emergency Department (HOSPITAL_BASED_OUTPATIENT_CLINIC_OR_DEPARTMENT_OTHER)
Admission: EM | Admit: 2015-09-05 | Discharge: 2015-09-05 | Disposition: A | Discharge status: Home / Self Care | Payer: BLUE CROSS/BLUE SHIELD | Attending: Emergency Medicine | Admitting: Emergency Medicine

## 2015-09-05 DIAGNOSIS — E119 Type 2 diabetes mellitus without complications: Secondary | ICD-10-CM | POA: Diagnosis not present

## 2015-09-05 DIAGNOSIS — Z794 Long term (current) use of insulin: Secondary | ICD-10-CM | POA: Diagnosis not present

## 2015-09-05 DIAGNOSIS — M25461 Effusion, right knee: Secondary | ICD-10-CM | POA: Diagnosis not present

## 2015-09-05 DIAGNOSIS — I10 Essential (primary) hypertension: Secondary | ICD-10-CM | POA: Insufficient documentation

## 2015-09-05 DIAGNOSIS — Z79899 Other long term (current) drug therapy: Secondary | ICD-10-CM | POA: Diagnosis not present

## 2015-09-05 DIAGNOSIS — M25561 Pain in right knee: Secondary | ICD-10-CM | POA: Diagnosis present

## 2015-09-05 MED ORDER — HYDROCODONE-ACETAMINOPHEN 5-325 MG PO TABS
1.0000 | ORAL_TABLET | Freq: Once | ORAL | Status: AC
  Administered 2015-09-05: 1 via ORAL
  Filled 2015-09-05: qty 1

## 2015-09-05 MED ORDER — HYDROCODONE-ACETAMINOPHEN 5-325 MG PO TABS
1.0000 | ORAL_TABLET | Freq: Four times a day (QID) | ORAL | Status: DC | PRN
Start: 2015-09-05 — End: 2015-09-05

## 2015-09-05 MED ORDER — HYDROCODONE-ACETAMINOPHEN 5-325 MG PO TABS
1.0000 | ORAL_TABLET | Freq: Four times a day (QID) | ORAL | Status: DC | PRN
Start: 2015-09-05 — End: 2016-12-16

## 2015-09-05 MED ORDER — LIDOCAINE-EPINEPHRINE 2 %-1:100000 IJ SOLN
20.0000 mL | Freq: Once | INTRAMUSCULAR | Status: AC
  Administered 2015-09-05: 20 mL
  Filled 2015-09-05: qty 1

## 2015-09-05 MED FILL — HYDROCODON-APAP 5-325: 5-325 | 2 days supply | Qty: 20 | Fill #0

## 2015-09-05 NOTE — ED Provider Notes (Addendum)
CSN: [id number]     Arrival date & time 09/05/15  [id number] History   First MD Initiated Contact with Patient 09/05/15 0845     Chief Complaint  Patient presents with  . Knee Pain     (Consider location/radiation/quality/duration/timing/severity/associated sxs/prior Treatment) Patient is a 45 y.o. male presenting with knee pain. The history is provided by the patient.  Knee Pain Location:  Knee Time since incident:  3 days Injury: no   Knee location:  R knee Pain details:    Quality:  Aching, shooting, throbbing, pressure and sharp   Radiates to: radiates from knee down to the foot.   Severity:  Severe   Onset quality:  Gradual   Timing:  Constant   Progression:  Worsening Chronicity:  New Dislocation: no   Prior injury to area:  No Relieved by:  Rest and elevation Worsened by:  Bearing weight Ineffective treatments:  Elevation Associated symptoms: decreased ROM, stiffness and swelling   Associated symptoms: no fever and no muscle weakness   Risk factors: recent illness   Risk factors comment:  In the hospital for 2 weeks about 1 month ago for CHF/afib s/p cardioversion currently on coumadin last checked monday and 1.7 and ongoing lower ext swelling   Past Medical History  Diagnosis Date  . Hypertension   . Diabetes ([place])   . Atrial flutter with rapid ventricular response ([place]) 08/06/2015   Past Surgical History  Procedure Laterality Date  . Cholecystectomy    . Tee without cardioversion N/A 08/01/2015    Procedure: TRANSESOPHAGEAL ECHOCARDIOGRAM (TEE);  Surgeon: Josue Hector, MD;  Location: [facility] ENDOSCOPY;  Service: Cardiovascular;  Laterality: N/A;  . Cardioversion N/A 08/01/2015    Procedure: CARDIOVERSION;  Surgeon: Josue Hector, MD;  Location: [facility] ENDOSCOPY;  Service: Cardiovascular;  Laterality: N/A;   Family History  Problem Relation Age of Onset  . Heart disease Mother   . Hyperlipidemia Father   . Hypertension Father    Social History  Substance Use Topics    . Smoking status: Never Smoker   . Smokeless tobacco: Never Used  . Alcohol Use: No    Review of Systems  Constitutional: Negative for fever.  Musculoskeletal: Positive for stiffness.  All other systems reviewed and are negative.     Allergies  Review of patient's allergies indicates no known allergies.  Home Medications   Prior to Admission medications   Medication Sig Start Date End Date Taking? Authorizing Provider  albuterol (PROVENTIL HFA;VENTOLIN HFA) 108 (90 BASE) MCG/ACT inhaler Inhale 1-2 puffs into the lungs every 6 (six) hours as needed for wheezing. 05/30/14  Yes Thayer Headings, MD  carvedilol (COREG) 25 MG tablet Take 1 tablet (25 mg total) by mouth 2 (two) times daily with a meal. 08/19/15  Yes Pixie Casino, MD  fluticasone (FLONASE) 50 MCG/ACT nasal spray Place 2 sprays into the nose daily. 12/29/12  Yes Janne Napoleon, NP  furosemide (LASIX) 80 MG tablet Take 2 tablets (160 mg total) by mouth 2 (two) times daily. 08/19/15  Yes Pixie Casino, MD  insulin aspart (NOVOLOG PENFILL) cartridge Inject 5 Units into the skin 3 (three) times daily with meals. 08/08/15  Yes Charlynne Cousins, MD  Insulin Glargine (LANTUS) 100 UNIT/ML Solostar Pen Inject 10 Units into the skin daily at 10 pm. 08/08/15  Yes Charlynne Cousins, MD  Insulin Pen Needle 31G X 6 MM MISC 1 Device by Does not apply route 2 (two) times daily. 08/08/15  Yes Tammi Klippel  Aileen Fass, MD  isosorbide-hydrALAZINE (BIDIL) 20-37.5 MG tablet Take 1 tablet by mouth 3 (three) times daily. 09/04/15  Yes Pixie Casino, MD  Multiple Vitamin (MULTIVITAMIN WITH MINERALS) TABS tablet Take 2 tablets by mouth daily.   Yes Historical Provider, MD  potassium chloride SA (KLOR-CON M20) 20 MEQ tablet Take 1 tablet (20 mEq total) by mouth daily. 08/19/15  Yes Pixie Casino, MD  warfarin (COUMADIN) 5 MG tablet Take 1.5 tablets (7.5 mg total) by mouth daily. 08/19/15  Yes Pixie Casino, MD  chlorpheniramine-HYDROcodone [facility]  PENNKINETIC ER) 10-8 MG/5ML SUER Take 5 mLs by mouth 2 (two) times daily as needed for cough.    Historical Provider, MD   There were no vitals taken for this visit. Physical Exam  Constitutional: He is oriented to person, place, and time. He appears well-developed and well-nourished. No distress.  HENT:  Head: Normocephalic and atraumatic.  Eyes: EOM are normal. Pupils are equal, round, and reactive to light.  Cardiovascular: Normal rate.   Pulmonary/Chest: Effort normal.  Musculoskeletal: He exhibits edema and tenderness.       Right knee: He exhibits decreased range of motion, swelling and effusion. He exhibits no ecchymosis and no erythema. Tenderness found. Medial joint line and lateral joint line tenderness noted.       Legs: Neurological: He is alert and oriented to person, place, and time.  Skin: Skin is warm and dry. No erythema.  Psychiatric: He has a normal mood and affect. His behavior is normal.  Nursing note and vitals reviewed.   ED Course  .Joint Aspiration/Arthrocentesis Date/Time: 09/05/2015 9:53 AM Performed by: Blanchie Dessert Authorized by: Blanchie Dessert Consent: Verbal consent obtained. Risks and benefits: risks, benefits and alternatives were discussed Consent given by: patient Patient understanding: patient states understanding of the procedure being performed Procedure consent: procedure consent matches procedure scheduled Site marked: the operative site was marked Imaging studies: imaging studies available Patient identity confirmed: verbally with patient and arm band Time out: Immediately prior to procedure a "time out" was called to verify the correct patient, procedure, equipment, support staff and site/side marked as required. Indications: joint swelling and pain  Body area: knee Joint: right knee Local anesthesia used: yes Local anesthetic: lidocaine 1% with epinephrine Anesthetic total: 10 ml Patient sedated: no Preparation: Patient was  prepped and draped in the usual sterile fashion. Needle gauge: 18 G Ultrasound guidance: no Approach: lateral Aspirate: clear and yellow Aspirate amount: 35 mL Patient tolerance: Patient tolerated the procedure well with no immediate complications   (including critical care time) Labs Review Labs Reviewed - No data to display  Imaging Review Dg Knee Complete 4 Views Right  09/05/2015  CLINICAL DATA:  Chronic right knee pain without known injury. EXAM: RIGHT KNEE - COMPLETE 4+ VIEW COMPARISON:  None. FINDINGS: No evidence of fracture, dislocation, or joint effusion. No evidence of arthropathy or other focal bone abnormality. Soft tissues are unremarkable. IMPRESSION: Normal right knee. Electronically Signed   By: Marijo Conception, M.D.   On: 09/05/2015 09:08   I have personally reviewed and evaluated these images and lab results as part of my medical decision-making.   EKG Interpretation None      MDM   Final diagnoses:  Knee joint effusion, right    Patient is a 45 year old male with a history of diabetes, CHF and atrial fibrillation status post cardioversion presenting today with worsening right knee pain for the last 3 days. He denies any injury and no redness  or fever. But he has significant pain when attempting to walk. On exam patient has bilateral lower extremity swelling worse on the right but significant right knee swelling and mild warmth. No erythema or concern for septic joint at this time. Patient denies any trauma and x-ray is within normal limits. However concern for an inflammatory knee effusion. We'll attempt to aspirate some fluid for pain control. Also will wrap the knee and give patient pain control. Given follow-up to Dr. Barbaraann Barthel. Patient last took his blood sugar this morning it was 160.  He denies any chest pain or shortness of breath at this time. Low suspicion that symptoms are related to DVT as patient states his leg swelling has significantly improved in the  white right has always been worse than the left.  No calf or thigh tenderness.    Blanchie Dessert, MD 09/05/15 [place], MD 09/05/15 [phone]

## 2015-09-05 NOTE — ED Notes (Signed)
Patient transported to X-ray 

## 2015-09-05 NOTE — ED Notes (Signed)
Pt c/o right knee pain and swelling x 3 days. Denies Injury

## 2015-09-05 NOTE — ED Notes (Signed)
Pt directed to pharmacy to pick up medications. Pt has a ride with him. Crutches given for home use with teaching by Lonn Georgia, EMT

## 2015-09-05 NOTE — ED Notes (Signed)
MD at bedside. 

## 2015-09-05 NOTE — Discharge Instructions (Signed)
Knee Effusion °Knee effusion means that you have extra fluid in your knee. This can cause pain. Your knee may be more difficult to bend and move. °HOME CARE °· Use crutches as told by your doctor. °· Wear a knee brace as told by your doctor. °· Apply ice to the swollen area: °¨ Put ice in a plastic bag. °¨ Place a towel between your skin and the bag. °¨ Leave the ice on for 20 minutes, 2-3 times per day. °· Keep your knee raised (elevated) when you are sitting or lying down. °· Take medicines only as told by your doctor. °· Do any rehabilitation or strengthening exercises as told by your doctor. °· Rest your knee as told by your doctor. You may start doing your normal activities again when your doctor says it is okay. °· Keep all follow-up visits as told by your doctor. This is important. °GET HELP IF:  °· You continue to have pain in your knee. °GET HELP RIGHT AWAY IF: °· You have increased swelling or redness of your knee. °· You have severe pain in your knee. °· You have a fever. °  °This information is not intended to replace advice given to you by your health care provider. Make sure you discuss any questions you have with your health care provider. °  °Document Released: 04/04/2010 Document Revised: 03/23/2014 Document Reviewed: 10/16/2013 °Elsevier Interactive Patient Education ©2016 [employer] ° °

## 2015-09-05 NOTE — ED Notes (Signed)
Pt reports his blood sugar this am was 160. Also he had his INR checked on Monday and was 1.9 and warfarin dose increased

## 2015-09-06 NOTE — Telephone Encounter (Signed)
Thanks for the info.  Dr. Lemmie Evens

## 2015-09-09 ENCOUNTER — Ambulatory Visit: Payer: BLUE CROSS/BLUE SHIELD | Admitting: Dietician

## 2015-09-16 ENCOUNTER — Ambulatory Visit (INDEPENDENT_AMBULATORY_CARE_PROVIDER_SITE_OTHER): Payer: BLUE CROSS/BLUE SHIELD | Admitting: Pharmacist Clinician (PhC)/ Clinical Pharmacy Specialist

## 2015-09-16 ENCOUNTER — Other Ambulatory Visit: Payer: Self-pay | Admitting: Pharmacist Clinician (PhC)/ Clinical Pharmacy Specialist

## 2015-09-16 DIAGNOSIS — Z7901 Long term (current) use of anticoagulants: Secondary | ICD-10-CM | POA: Diagnosis not present

## 2015-09-16 DIAGNOSIS — I4892 Unspecified atrial flutter: Secondary | ICD-10-CM | POA: Diagnosis not present

## 2015-09-16 DIAGNOSIS — I480 Paroxysmal atrial fibrillation: Secondary | ICD-10-CM | POA: Diagnosis not present

## 2015-09-16 LAB — POCT INR: INR: 2

## 2015-09-16 MED ORDER — WARFARIN SODIUM 5 MG PO TABS: ORAL_TABLET | ORAL | Status: DC

## 2015-09-30 ENCOUNTER — Ambulatory Visit (INDEPENDENT_AMBULATORY_CARE_PROVIDER_SITE_OTHER): Payer: BLUE CROSS/BLUE SHIELD | Admitting: Pharmacist Clinician (PhC)/ Clinical Pharmacy Specialist

## 2015-09-30 DIAGNOSIS — Z7901 Long term (current) use of anticoagulants: Secondary | ICD-10-CM | POA: Diagnosis not present

## 2015-09-30 DIAGNOSIS — I480 Paroxysmal atrial fibrillation: Secondary | ICD-10-CM | POA: Diagnosis not present

## 2015-09-30 DIAGNOSIS — I4892 Unspecified atrial flutter: Secondary | ICD-10-CM | POA: Diagnosis not present

## 2015-09-30 LAB — POCT INR: INR: 1.5

## 2015-10-15 ENCOUNTER — Ambulatory Visit (INDEPENDENT_AMBULATORY_CARE_PROVIDER_SITE_OTHER): Payer: BLUE CROSS/BLUE SHIELD | Admitting: Pharmacist Clinician (PhC)/ Clinical Pharmacy Specialist

## 2015-10-15 DIAGNOSIS — I4892 Unspecified atrial flutter: Secondary | ICD-10-CM | POA: Diagnosis not present

## 2015-10-15 DIAGNOSIS — Z7901 Long term (current) use of anticoagulants: Secondary | ICD-10-CM | POA: Diagnosis not present

## 2015-10-15 DIAGNOSIS — I480 Paroxysmal atrial fibrillation: Secondary | ICD-10-CM

## 2015-10-15 LAB — POCT INR: INR: 1.5

## 2015-10-29 ENCOUNTER — Ambulatory Visit (INDEPENDENT_AMBULATORY_CARE_PROVIDER_SITE_OTHER): Payer: BLUE CROSS/BLUE SHIELD | Admitting: Pharmacist

## 2015-10-29 DIAGNOSIS — I480 Paroxysmal atrial fibrillation: Secondary | ICD-10-CM | POA: Diagnosis not present

## 2015-10-29 DIAGNOSIS — Z7901 Long term (current) use of anticoagulants: Secondary | ICD-10-CM

## 2015-10-29 DIAGNOSIS — I4892 Unspecified atrial flutter: Secondary | ICD-10-CM

## 2015-10-29 LAB — POCT INR: INR: 1.9

## 2015-10-29 MED ORDER — WARFARIN SODIUM 5 MG PO TABS: ORAL_TABLET | ORAL | 1 refills | Status: DC

## 2015-11-08 ENCOUNTER — Telehealth: Payer: Self-pay | Admitting: Internal Medicine

## 2015-11-08 NOTE — Telephone Encounter (Signed)
Received records from [facility] for appointment on 11/11/15 with Dr Debara Pickett.  Records given to [facility] (medical records) for Dr Lysbeth Penner schedule on 11/11/15. lp

## 2015-11-11 ENCOUNTER — Ambulatory Visit (INDEPENDENT_AMBULATORY_CARE_PROVIDER_SITE_OTHER): Payer: BLUE CROSS/BLUE SHIELD | Admitting: Internal Medicine

## 2015-11-11 ENCOUNTER — Telehealth: Payer: Self-pay | Admitting: Internal Medicine

## 2015-11-11 ENCOUNTER — Ambulatory Visit (INDEPENDENT_AMBULATORY_CARE_PROVIDER_SITE_OTHER): Payer: BLUE CROSS/BLUE SHIELD | Admitting: Pharmacist Clinician (PhC)/ Clinical Pharmacy Specialist

## 2015-11-11 ENCOUNTER — Encounter: Payer: Self-pay | Admitting: Internal Medicine

## 2015-11-11 VITALS — BP 124/86 | HR 79 | Ht 71.0 in | Wt 283.2 lb

## 2015-11-11 DIAGNOSIS — Z7901 Long term (current) use of anticoagulants: Secondary | ICD-10-CM

## 2015-11-11 DIAGNOSIS — I110 Hypertensive heart disease with heart failure: Secondary | ICD-10-CM

## 2015-11-11 DIAGNOSIS — I4892 Unspecified atrial flutter: Secondary | ICD-10-CM

## 2015-11-11 DIAGNOSIS — E785 Hyperlipidemia, unspecified: Secondary | ICD-10-CM

## 2015-11-11 DIAGNOSIS — E1129 Type 2 diabetes mellitus with other diabetic kidney complication: Secondary | ICD-10-CM

## 2015-11-11 DIAGNOSIS — I480 Paroxysmal atrial fibrillation: Secondary | ICD-10-CM

## 2015-11-11 DIAGNOSIS — I428 Other cardiomyopathies: Secondary | ICD-10-CM | POA: Insufficient documentation

## 2015-11-11 DIAGNOSIS — I429 Cardiomyopathy, unspecified: Secondary | ICD-10-CM | POA: Diagnosis not present

## 2015-11-11 DIAGNOSIS — IMO0001 Reserved for inherently not codable concepts without codable children: Secondary | ICD-10-CM

## 2015-11-11 DIAGNOSIS — Z794 Long term (current) use of insulin: Secondary | ICD-10-CM

## 2015-11-11 LAB — POCT INR: INR: 1.8

## 2015-11-11 MED ORDER — RIVAROXABAN 20 MG PO TABS: 20.0000 mg | ORAL_TABLET | Freq: Every day | ORAL | 5 refills | Status: DC

## 2015-11-11 MED ORDER — RIVAROXABAN 15 MG PO TABS: 15.0000 mg | ORAL_TABLET | Freq: Every day | ORAL | 5 refills | Status: DC

## 2015-11-11 NOTE — Progress Notes (Signed)
OFFICE NOTE  Chief Complaint:  Routine follow-up  Primary Care Physician: John Merino, MD  HPI:  John Bailey is a 45 y.o. male patient, previously followed by Dr. Acie Bailey, who has requested to see me after he week-long hospitalization for volume overload, atrial flutter with RVR and acute systolic congestive heart failure - LVEF now 35%. He also developed acute kidney injury and is being followed by Dr. Moshe Bailey. Past medical history is also significant for insulin-dependent diabetes, morbid obesity, LVH and dyslipidemia. He was admitted for atrial flutter with rapid ventricular response and underwent TEE cardioversion on 08/01/2015 which showed global hypokinesis and EF 25-30%. Surface echo a day prior to that showed EF of 35-40%. He was successfully cardioverted and has maintained sinus rhythm since that time. He had marked volume overload and diuresed more than 25 pounds. He continues to lose weight in fact his discharge weight was 136 kg and is currently 127 kg. He denies chest pain or shortness of breath. His INR was tested today as he is on warfarin and found to be 1.7. Adjustments were made to his Coumadin. Blood pressure remains elevated.  11/11/2015  John Bailey returns today for follow-up. He was seen last 3 months ago in hospital follow-up and since that time his been doing well. He denies any worsening shortness of breath or chest pain. Weight is actually up about 3 pounds since I last saw him. He was recently seen by Dr. Moshe Bailey at [facility]. She noted that Lyme status appears to be somewhat heavy despite being on high-dose Lasix, but he may be fairly euvolemic at this point. His hemoglobin A1c was tested and elevated at 7.6. Creatinine however did look somewhat better at 1.74. He was placed on warfarin because of atrial flutter but has remained in sinus rhythm since discharge. I believe warfarin was chosen due to his chronic kidney disease, but we have been  monitoring his INRs since discharge and over the past 3 months he has been rarely between 2 and 3 for therapeutic INR. He reports compliance with medications and says that he takes them regularly but may forget to take them on time yet he still remembers to take the medicines, this is despite some concern about chronic noncompliance. My concern is that he is not being adequately treated with warfarin as were unable to achieve a therapeutic INR despite being on essentially 10 mg of warfarin daily. Given that he has had improvement in his renal function, we discussed the possibility of going on to a novel oral anticoagulant.  PMHx:  Past Medical History:  Diagnosis Date  . Atrial flutter with rapid ventricular response ([place]) 08/06/2015  . Diabetes ([place])   . Hypertension     Past Surgical History:  Procedure Laterality Date  . CARDIOVERSION N/A 08/01/2015   Procedure: CARDIOVERSION;  Surgeon: John Hector, MD;  Location: [facility] ENDOSCOPY;  Service: Cardiovascular;  Laterality: N/A;  . CHOLECYSTECTOMY    . TEE WITHOUT CARDIOVERSION N/A 08/01/2015   Procedure: TRANSESOPHAGEAL ECHOCARDIOGRAM (TEE);  Surgeon: John Hector, MD;  Location: [facility] ENDOSCOPY;  Service: Cardiovascular;  Laterality: N/A;    FAMHx:  Family History  Problem Relation Age of Onset  . Heart disease Mother   . Hyperlipidemia Father   . Hypertension Father     SOCHx:   reports that he has never smoked. He has never used smokeless tobacco. He reports that he does not drink alcohol or use drugs.  ALLERGIES:  No Known Allergies  ROS: Pertinent items noted in HPI and remainder of comprehensive ROS otherwise negative.  HOME MEDS: Current Outpatient Prescriptions  Medication Sig Dispense Refill  . albuterol (PROVENTIL HFA;VENTOLIN HFA) 108 (90 BASE) MCG/ACT inhaler Inhale 1-2 puffs into the lungs every 6 (six) hours as needed for wheezing. 1 Inhaler 3  . carvedilol (COREG) 25 MG tablet Take 1 tablet (25 mg total) by mouth 2  (two) times daily with a meal. 60 tablet 3  . chlorpheniramine-HYDROcodone (TUSSIONEX PENNKINETIC ER) 10-8 MG/5ML SUER Take 5 mLs by mouth 2 (two) times daily as needed for cough.    . colchicine 0.6 MG tablet Take 0.6 mg by mouth as needed.    . fluticasone (FLONASE) 50 MCG/ACT nasal spray Place 2 sprays into the nose daily. 16 g 2  . furosemide (LASIX) 80 MG tablet Take 2 tablets (160 mg total) by mouth 2 (two) times daily. 120 tablet 2  . gabapentin (NEURONTIN) 300 MG capsule Take 1 capsule by mouth 3 (three) times daily.  0  . HYDROcodone-acetaminophen (NORCO/VICODIN) 5-325 MG tablet Take 1-2 tablets by mouth every 6 (six) hours as needed for severe pain. 20 tablet 0  . insulin aspart (NOVOLOG PENFILL) cartridge Inject 5 Units into the skin 3 (three) times daily with meals. 15 mL 11  . Insulin Glargine (LANTUS) 100 UNIT/ML Solostar Pen Inject 10 Units into the skin daily at 10 pm. 15 mL 0  . Insulin Pen Needle 31G X 6 MM MISC 1 Device by Does not apply route 2 (two) times daily. 60 each 3  . isosorbide-hydrALAZINE (BIDIL) 20-37.5 MG tablet Take 1 tablet by mouth 3 (three) times daily. 90 tablet 5  . Multiple Vitamin (MULTIVITAMIN WITH MINERALS) TABS tablet Take 2 tablets by mouth daily.    . potassium chloride SA (KLOR-CON M20) 20 MEQ tablet Take 1 tablet (20 mEq total) by mouth daily. 90 tablet 3  . Rivaroxaban (XARELTO) 15 MG TABS tablet Take 1 tablet (15 mg total) by mouth daily with supper. 30 tablet 5   No current facility-administered medications for this visit.     LABS/IMAGING: No results found for this or any previous visit (from the past 48 hour(s)). No results found.  WEIGHTS: Wt Readings from Last 3 Encounters:  11/11/15 283 lb 3.2 oz (128.5 kg)  09/05/15 288 lb (130.6 kg)  08/14/15 280 lb (127 kg)    VITALS: BP 124/86 (BP Location: Left Arm, Patient Position: Sitting, Cuff Size: Large)   Pulse 79   Ht 5\' 11"  (1.803 m)   Wt 283 lb 3.2 oz (128.5 kg)   BMI 39.50 kg/m     EXAM: General appearance: alert, no distress and morbidly obese Neck: no carotid bruit, no JVD and thyroid not enlarged, symmetric, no tenderness/mass/nodules Lungs: clear to auscultation bilaterally Heart: regular rate and rhythm, S1, S2 normal, no murmur, click, rub or gallop Abdomen: soft, non-tender; bowel sounds normal; no masses,  no organomegaly Extremities: edema 1+ tense edema Pulses: 2+ and symmetric Skin: Skin color, texture, turgor normal. No rashes or lesions Neurologic: Grossly normal Psych: Pleasant  EKG: Sinus rhythm with PVCs at 79  ASSESSMENT: 1. Paroxysmal atrial flutter status post TEE cardioversion 2. Nonischemic cardiomyopathy EF 25-35% 3. Chronic anticoagulation on warfarin - CHADSVASC score of 5 4. Acute kidney injury 5. Type 2 diabetes 6. Dyslipidemia 7. Hypertensive heart disease with heart failure  PLAN: 1.   Mr. Dema has been fairly stable although weight is up somewhat since discharge. He reports breathing better denies  any chest pain. He is on high-dose diuretics and appears to be euvolemic at this point. There is question about his compliance with medication and his INR is been very difficult to achieve a therapeutic range despite high dose of warfarin. Based on the fact is been so subtherapeutic, but this switch him over to Xarelto. Given his GFR of 55, he would qualify for the 20 mg daily dose. Plan to recheck an echocardiogram in 3 months to see if there's been any improvement in his LV function. I'm still hesitant to start ACE inhibitor/ARB/or Entresto given his renal dysfunction. He continues to need to work on weight loss and more exercise. This is been limited by right knee pain which was felt to be gout by an orthopedist and is currently being managed.  Follow-up 3 months.  Pixie Casino, MD, [facility] Attending Cardiologist [facility] 11/11/2015, 10:23 AM

## 2015-11-11 NOTE — Telephone Encounter (Signed)
LM for patient regarding xarelto.   1. Per Erasmo Downer, since INR level was subtherapeutic, he can start xarelto tonite 2. Per Dr. Debara Pickett, "Mr. Fleagle's creatinine was 1.74 (clearance 55) recently - should be on 20 mg Xarelto (Was given samples for 15 mg)"  -- informed patient via VM message that he should pick up new Rx for xarelto 20mg  w/co-pay card provided and start on this instead of 15mg  tablets  Asked for call back to confirm receipt of information

## 2015-11-11 NOTE — Patient Instructions (Addendum)
Your physician has requested that you have an echocardiogram in [place] @ [address]. Echocardiography is a painless test that uses sound waves to create images of your heart. It provides your doctor with information about the size and shape of your heart and how well your heart's chambers and valves are working. This procedure takes approximately one hour. There are no restrictions for this procedure.  Your physician has recommended you make the following change in your medication:  1. STOP warfarin 2. START xarelto 15mg  once daily with a meal  Your physician recommends that you schedule a follow-up appointment in: [place] after your echocardiogram.   30 tablets of xarelto 15mg  samples & co-pay card provided to patient

## 2015-11-12 NOTE — Telephone Encounter (Signed)
Called patient. He received VM from yesterday. He states the pharmacy did not have xarelto 20mg  in stock yesterday, but patient had already taken warfarin dose yesterday 8/28 AM. He will pick up Rx for xarelto 20mg  today and get started on this at dinnertime.

## 2016-01-20 ENCOUNTER — Telehealth (INDEPENDENT_AMBULATORY_CARE_PROVIDER_SITE_OTHER): Payer: Self-pay | Admitting: Sports Medicine

## 2016-01-20 NOTE — Telephone Encounter (Signed)
Patient called advised he just left the pharmacy and was told his Rx for Gout is not there yet. Patient advised the Pharmacy said they called the Rx in yesterday.  The number to contact patient is [phone]

## 2016-01-22 NOTE — Telephone Encounter (Signed)
Talked with patient and he stated that pharmacy had faxed over a Rx request.  I advised patient that we have not received a fax from his pharmacy at this time.  Would like a Rx refill for his gout?

## 2016-01-23 MED ORDER — COLCHICINE 0.6 MG PO TABS: 0.6000 mg | ORAL_TABLET | Freq: Two times a day (BID) | ORAL | 0 refills | Status: DC | PRN

## 2016-01-23 NOTE — Telephone Encounter (Signed)
Rx sent to pharmacy   

## 2016-01-28 ENCOUNTER — Ambulatory Visit
Admission: RE | Admit: 2016-01-28 | Discharge: 2016-01-28 | Disposition: A | Discharge status: Home / Self Care | Payer: BLUE CROSS/BLUE SHIELD | Source: Ambulatory Visit | Attending: Internal Medicine | Admitting: Internal Medicine

## 2016-01-28 ENCOUNTER — Other Ambulatory Visit: Payer: Self-pay | Admitting: Internal Medicine

## 2016-01-28 DIAGNOSIS — R059 Cough, unspecified: Secondary | ICD-10-CM

## 2016-01-28 DIAGNOSIS — R05 Cough: Secondary | ICD-10-CM

## 2016-02-11 ENCOUNTER — Other Ambulatory Visit (HOSPITAL_COMMUNITY): Payer: BLUE CROSS/BLUE SHIELD

## 2016-02-18 ENCOUNTER — Ambulatory Visit: Payer: BLUE CROSS/BLUE SHIELD | Admitting: Internal Medicine

## 2016-02-28 ENCOUNTER — Ambulatory Visit: Payer: BLUE CROSS/BLUE SHIELD | Admitting: Internal Medicine

## 2016-03-11 ENCOUNTER — Telehealth: Payer: Self-pay | Admitting: Internal Medicine

## 2016-03-11 NOTE — Telephone Encounter (Signed)
Received records from [facility] for appointment on 03/20/16 with Dr Debara Pickett.  Records given to [facility] (medical records) for Dr Lysbeth Penner schedule on 03/20/16. lp

## 2016-03-18 ENCOUNTER — Emergency Department (HOSPITAL_BASED_OUTPATIENT_CLINIC_OR_DEPARTMENT_OTHER)
Admission: EM | Admit: 2016-03-18 | Discharge: 2016-03-18 | Disposition: A | Discharge status: Home / Self Care | Payer: BLUE CROSS/BLUE SHIELD | Attending: Emergency Medicine | Admitting: Emergency Medicine

## 2016-03-18 ENCOUNTER — Encounter (HOSPITAL_BASED_OUTPATIENT_CLINIC_OR_DEPARTMENT_OTHER): Payer: Self-pay | Admitting: *Deleted

## 2016-03-18 DIAGNOSIS — E119 Type 2 diabetes mellitus without complications: Secondary | ICD-10-CM | POA: Insufficient documentation

## 2016-03-18 DIAGNOSIS — M10021 Idiopathic gout, right elbow: Secondary | ICD-10-CM | POA: Diagnosis not present

## 2016-03-18 DIAGNOSIS — Z794 Long term (current) use of insulin: Secondary | ICD-10-CM | POA: Insufficient documentation

## 2016-03-18 DIAGNOSIS — I10 Essential (primary) hypertension: Secondary | ICD-10-CM | POA: Insufficient documentation

## 2016-03-18 DIAGNOSIS — M25521 Pain in right elbow: Secondary | ICD-10-CM | POA: Diagnosis present

## 2016-03-18 MED ORDER — PREDNISONE 50 MG PO TABS
60.0000 mg | ORAL_TABLET | Freq: Once | ORAL | Status: AC
  Administered 2016-03-18: 60 mg via ORAL
  Filled 2016-03-18: qty 1

## 2016-03-18 MED ORDER — METHYLPREDNISOLONE 4 MG PO TBPK: ORAL_TABLET | ORAL | 0 refills | Status: DC

## 2016-03-18 MED ORDER — OXYCODONE-ACETAMINOPHEN 5-325 MG PO TABS: 2.0000 | ORAL_TABLET | ORAL | 0 refills | Status: DC | PRN

## 2016-03-18 MED ORDER — OXYCODONE-ACETAMINOPHEN 5-325 MG PO TABS
2.0000 | ORAL_TABLET | Freq: Once | ORAL | Status: AC
  Administered 2016-03-18: 2 via ORAL
  Filled 2016-03-18: qty 2

## 2016-03-18 MED ORDER — COLCHICINE 0.6 MG PO TABS: 0.6000 mg | ORAL_TABLET | Freq: Two times a day (BID) | ORAL | 0 refills | Status: DC

## 2016-03-18 NOTE — ED Provider Notes (Signed)
[facility] DEPT [facility] Provider Note   CSN: [id number] Arrival date & time: 03/18/16  0152     History   Chief Complaint Chief Complaint  Patient presents with  . Elbow Pain    HPI John Bailey is a 46 y.o. male. He presents for evaluation of atraumatic right elbow pain. Has a history of gout in his right knee several years ago diagnosed with arthrocentesis. Pain is all over last 2-3 days. Was trying to hold the child today and states it was so painful he could barely hold onto them his become swollen. He can't sleep.  HPI  Past Medical History:  Diagnosis Date  . Atrial flutter with rapid ventricular response ([place]) 08/06/2015  . Diabetes ([place])   . Hypertension     Patient Active Problem List   Diagnosis Date Noted  . Non-ischemic cardiomyopathy ([place]) 11/11/2015  . Long term (current) use of anticoagulants [Z79.01] 08/14/2015  . Insulin-dependent diabetes mellitus with renal complications ([place]) 70/48/8891  . Atrial flutter with rapid ventricular response ([place]) 08/06/2015  . Hyponatremia 07/30/2015  . AKI (acute kidney injury) ([place]) 07/30/2015  . Hyperkalemia 07/30/2015  . Acute combined systolic (congestive) and diastolic (congestive) heart failure 07/30/2015  . Morbid obesity ([place]) 07/30/2015  . Paroxysmal atrial fibrillation ([place]) 07/30/2015  . 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 Hypertensive heart disease with heart failure ([place]) 07/14/2012  . Dyspnea 07/14/2012  . Hyperlipidemia 07/14/2012    Past Surgical History:  Procedure Laterality Date  . CARDIOVERSION N/A 08/01/2015   Procedure: CARDIOVERSION;  Surgeon: Josue Hector, MD;  Location: [facility] ENDOSCOPY;  Service: Cardiovascular;  Laterality: N/A;  . CHOLECYSTECTOMY    . TEE WITHOUT CARDIOVERSION N/A 08/01/2015   Procedure: TRANSESOPHAGEAL ECHOCARDIOGRAM (TEE);  Surgeon: Josue Hector, MD;  Location: [facility] ENDOSCOPY;  Service: Cardiovascular;  Laterality: N/A;        Home Medications    Prior to Admission medications   Medication Sig Start Date End Date Taking? Authorizing Provider  albuterol (PROVENTIL HFA;VENTOLIN HFA) 108 (90 BASE) MCG/ACT inhaler Inhale 1-2 puffs into the lungs every 6 (six) hours as needed for wheezing. 05/30/14   Thayer Headings, MD  carvedilol (COREG) 25 MG tablet Take 1 tablet (25 mg total) by mouth 2 (two) times daily with a meal. 08/19/15   Pixie Casino, MD  chlorpheniramine-HYDROcodone [facility] PENNKINETIC ER) 10-8 MG/5ML SUER Take 5 mLs by mouth 2 (two) times daily as needed for cough.    Historical Provider, MD  colchicine 0.6 MG tablet Take 1 tablet (0.6 mg total) by mouth 2 (two) times daily. 03/18/16   Tanna Furry, MD  fluticasone (FLONASE) 50 MCG/ACT nasal spray Place 2 sprays into the nose daily. 12/29/12   Janne Napoleon, NP  furosemide (LASIX) 80 MG tablet Take 2 tablets (160 mg total) by mouth 2 (two) times daily. 08/19/15   Pixie Casino, MD  gabapentin (NEURONTIN) 300 MG capsule Take 1 capsule by mouth 3 (three) times daily. 09/09/15   Historical Provider, MD  HYDROcodone-acetaminophen (NORCO/VICODIN) 5-325 MG tablet Take 1-2 tablets by mouth every 6 (six) hours as needed for severe pain. 09/05/15   Blanchie Dessert, MD  insulin aspart (NOVOLOG PENFILL) cartridge Inject 5 Units into the skin 3 (three) times daily with meals. 08/08/15   Charlynne Cousins, MD  Insulin Glargine (LANTUS) 100 UNIT/ML Solostar Pen Inject 10 Units into the skin daily at 10 pm. 08/08/15   Charlynne Cousins, MD  Insulin Pen Needle  31G X 6 MM MISC 1 Device by Does not apply route 2 (two) times daily. 08/08/15   Charlynne Cousins, MD  isosorbide-hydrALAZINE (BIDIL) 20-37.5 MG tablet Take 1 tablet by mouth 3 (three) times daily. 09/04/15   Pixie Casino, MD  methylPREDNISolone (MEDROL DOSEPAK) 4 MG TBPK tablet 6 po on day 1, then decrease by 1 per day 03/18/16   Tanna Furry, MD  Multiple Vitamin (MULTIVITAMIN WITH MINERALS) TABS tablet Take 2  tablets by mouth daily.    Historical Provider, MD  oxyCODONE-acetaminophen (PERCOCET/ROXICET) 5-325 MG tablet Take 2 tablets by mouth every 4 (four) hours as needed. 03/18/16   Tanna Furry, MD  potassium chloride SA (KLOR-CON M20) 20 MEQ tablet Take 1 tablet (20 mEq total) by mouth daily. 08/19/15   Pixie Casino, MD  rivaroxaban (XARELTO) 20 MG TABS tablet Take 1 tablet (20 mg total) by mouth daily with supper. 11/11/15   Pixie Casino, MD    Family History Family History  Problem Relation Age of Onset  . Heart disease Mother   . Hyperlipidemia Father   . Hypertension Father     Social History Social History  Substance Use Topics  . Smoking status: Never Smoker  . Smokeless tobacco: Never Used  . Alcohol use No     Allergies   Patient has no known allergies.   Review of Systems Review of Systems  Constitutional: Negative for appetite change, chills, diaphoresis, fatigue and fever.  HENT: Negative for mouth sores, sore throat and trouble swallowing.   Eyes: Negative for visual disturbance.  Respiratory: Negative for cough, chest tightness, shortness of breath and wheezing.   Cardiovascular: Negative for chest pain.  Gastrointestinal: Negative for abdominal distention, abdominal pain, diarrhea, nausea and vomiting.  Endocrine: Negative for polydipsia, polyphagia and polyuria.  Genitourinary: Negative for dysuria, frequency and hematuria.  Musculoskeletal: Positive for arthralgias. Negative for gait problem.       Right elbow pain and swelling  Skin: Negative for color change, pallor and rash.  Neurological: Negative for dizziness, syncope, light-headedness and headaches.  Hematological: Does not bruise/bleed easily.  Psychiatric/Behavioral: Negative for behavioral problems and confusion.     Physical Exam Updated Vital Signs BP (!) 171/104 (BP Location: Left Arm)   Pulse 93   Temp 98.4 F (36.9 C) (Oral)   Resp 16   Ht 5\' 11"  (1.803 m)   Wt 294 lb (133.4 kg)    SpO2 96%   BMI 41.00 kg/m   Physical Exam  Constitutional: He is oriented to person, place, and time. He appears well-developed and well-nourished. No distress.  HENT:  Head: Normocephalic.  Eyes: Conjunctivae are normal. Pupils are equal, round, and reactive to light. No scleral icterus.  Neck: Normal range of motion. Neck supple. No thyromegaly present.  Cardiovascular: Normal rate and regular rhythm.  Exam reveals no gallop and no friction rub.   No murmur heard. Pulmonary/Chest: Effort normal and breath sounds normal. No respiratory distress. He has no wheezes. He has no rales.  Abdominal: Soft. Bowel sounds are normal. He exhibits no distension. There is no tenderness. There is no rebound.  Musculoskeletal: Normal range of motion.  Pain with range of motion of the right elbow. Palpable effusion. No erythema or warmth noted. Distal vascular and neuro exam normal.  Neurological: He is alert and oriented to person, place, and time.  Skin: Skin is warm and dry. No rash noted.  Psychiatric: He has a normal mood and affect. His behavior is normal.  ED Treatments / Results  Labs (all labs ordered are listed, but only abnormal results are displayed) Labs Reviewed - No data to display  EKG  EKG Interpretation None       Radiology No results found.  Procedures Procedures (including critical care time)  Medications Ordered in ED Medications  predniSONE (DELTASONE) tablet 60 mg (not administered)  oxyCODONE-acetaminophen (PERCOCET/ROXICET) 5-325 MG per tablet 2 tablet (not administered)     Initial Impression / Assessment and Plan / ED Course  I have reviewed the triage vital signs and the nursing notes.  Pertinent labs & imaging results that were available during my care of the patient were reviewed by me and considered in my medical decision making (see chart for details).  Clinical Course     Plan close seen, Medrol, Percocet. Sling. Primary care  follow-up.  Final Clinical Impressions(s) / ED Diagnoses   Final diagnoses:  Acute idiopathic gout of right elbow    New Prescriptions New Prescriptions   COLCHICINE 0.6 MG TABLET    Take 1 tablet (0.6 mg total) by mouth 2 (two) times daily.   METHYLPREDNISOLONE (MEDROL DOSEPAK) 4 MG TBPK TABLET    6 po on day 1, then decrease by 1 per day   OXYCODONE-ACETAMINOPHEN (PERCOCET/ROXICET) 5-325 MG TABLET    Take 2 tablets by mouth every 4 (four) hours as needed.     Tanna Furry, MD 03/18/16 [phone]

## 2016-03-18 NOTE — ED Triage Notes (Signed)
Pt with right elbow pain and swelling x 3 weeks.

## 2016-03-20 ENCOUNTER — Ambulatory Visit (INDEPENDENT_AMBULATORY_CARE_PROVIDER_SITE_OTHER): Payer: BLUE CROSS/BLUE SHIELD | Admitting: Internal Medicine

## 2016-03-20 ENCOUNTER — Encounter: Payer: Self-pay | Admitting: Internal Medicine

## 2016-03-20 VITALS — BP 145/95 | HR 82 | Ht 71.0 in | Wt 291.4 lb

## 2016-03-20 DIAGNOSIS — I5032 Chronic diastolic (congestive) heart failure: Secondary | ICD-10-CM

## 2016-03-20 DIAGNOSIS — I480 Paroxysmal atrial fibrillation: Secondary | ICD-10-CM

## 2016-03-20 DIAGNOSIS — I428 Other cardiomyopathies: Secondary | ICD-10-CM | POA: Diagnosis not present

## 2016-03-20 DIAGNOSIS — N179 Acute kidney failure, unspecified: Secondary | ICD-10-CM | POA: Diagnosis not present

## 2016-03-20 DIAGNOSIS — I110 Hypertensive heart disease with heart failure: Secondary | ICD-10-CM

## 2016-03-20 MED ORDER — ISOSORBIDE DINITRATE 10 MG PO TABS: 10.0000 mg | ORAL_TABLET | Freq: Two times a day (BID) | ORAL | 5 refills | Status: DC

## 2016-03-20 MED ORDER — HYDRALAZINE HCL 50 MG PO TABS: 50.0000 mg | ORAL_TABLET | Freq: Two times a day (BID) | ORAL | 5 refills | Status: DC

## 2016-03-20 NOTE — Patient Instructions (Addendum)
Your physician has requested that you have an echocardiogram @ [address] - [address]rd [address]. Echocardiography is a painless test that uses sound waves to create images of your heart. It provides your doctor with information about the size and shape of your heart and how well your heart's chambers and valves are working. This procedure takes approximately one hour. There are no restrictions for this procedure.  Your physician recommends that you schedule a follow-up appointment in [place] (after your echocardiogram)  Your physician has recommended you make the following change in your medication:  -- STOP Bidil -- START isosorbide dinitrate 10mg  twice daily -- START hydralazine 50mg  twice daily

## 2016-03-20 NOTE — Progress Notes (Signed)
OFFICE NOTE  Chief Complaint:  Routine follow-up, mild edema  Primary Care Physician: Barbette Merino, MD  HPI:  John Bailey is a 46 y.o. male patient, previously followed by Dr. Acie Fredrickson, who has requested to see me after he week-long hospitalization for volume overload, atrial flutter with RVR and acute systolic congestive heart failure - LVEF now 35%. He also developed acute kidney injury and is being followed by Dr. Moshe Cipro. Past medical history is also significant for insulin-dependent diabetes, morbid obesity, LVH and dyslipidemia. He was admitted for atrial flutter with rapid ventricular response and underwent TEE cardioversion on 08/01/2015 which showed global hypokinesis and EF 25-30%. Surface echo a day prior to that showed EF of 35-40%. He was successfully cardioverted and has maintained sinus rhythm since that time. He had marked volume overload and diuresed more than 25 pounds. He continues to lose weight in fact his discharge weight was 136 kg and is currently 127 kg. He denies chest pain or shortness of breath. His INR was tested today as he is on warfarin and found to be 1.7. Adjustments were made to his Coumadin. Blood pressure remains elevated.  11/11/2015  John Bailey returns today for follow-up. He was seen last 3 months ago in hospital follow-up and since that time his been doing well. He denies any worsening shortness of breath or chest pain. Weight is actually up about 3 pounds since I last saw him. He was recently seen by Dr. Moshe Cipro at [facility]. She noted that Lyme status appears to be somewhat heavy despite being on high-dose Lasix, but he may be fairly euvolemic at this point. His hemoglobin A1c was tested and elevated at 7.6. Creatinine however did look somewhat better at 1.74. He was placed on warfarin because of atrial flutter but has remained in sinus rhythm since discharge. I believe warfarin was chosen due to his chronic kidney disease, but  we have been monitoring his INRs since discharge and over the past 3 months he has been rarely between 2 and 3 for therapeutic INR. He reports compliance with medications and says that he takes them regularly but may forget to take them on time yet he still remembers to take the medicines, this is despite some concern about chronic noncompliance. My concern is that he is not being adequately treated with warfarin as were unable to achieve a therapeutic INR despite being on essentially 10 mg of warfarin daily. Given that he has had improvement in his renal function, we discussed the possibility of going on to a novel oral anticoagulant.  03/20/2016  John Bailey returns today for follow-up. We had planned on reviewing a repeat echo today however that was not yet obtained. He recently saw his nephrologist and was noted to be hypertensive with excess volume and was started on metolazone in addition to his Lasix. Is not clear whether his LV function has improved. He is maintaining sinus rhythm. He denies any chest pain or worsening shortness of breath. He reported that his BiDil is no longer going to be [employer]. He is close to running out of that medication. He seems to be tolerating Xarelto without any significant bleeding problems.  PMHx:  Past Medical History:  Diagnosis Date  . Atrial flutter with rapid ventricular response ([place]) 08/06/2015  . Diabetes ([place])   . Hypertension     Past Surgical History:  Procedure Laterality Date  . CARDIOVERSION N/A 08/01/2015   Procedure: CARDIOVERSION;  Surgeon: Josue Hector, MD;  Location:  [facility] ENDOSCOPY;  Service: Cardiovascular;  Laterality: N/A;  . CHOLECYSTECTOMY    . TEE WITHOUT CARDIOVERSION N/A 08/01/2015   Procedure: TRANSESOPHAGEAL ECHOCARDIOGRAM (TEE);  Surgeon: Josue Hector, MD;  Location: [facility] ENDOSCOPY;  Service: Cardiovascular;  Laterality: N/A;    FAMHx:  Family History  Problem Relation Age of Onset  . Heart disease Mother   .  Hyperlipidemia Father   . Hypertension Father     SOCHx:   reports that he has never smoked. He has never used smokeless tobacco. He reports that he does not drink alcohol or use drugs.  ALLERGIES:  No Known Allergies  ROS: Pertinent items noted in HPI and remainder of comprehensive ROS otherwise negative.  HOME MEDS: Current Outpatient Prescriptions  Medication Sig Dispense Refill  . albuterol (PROVENTIL HFA;VENTOLIN HFA) 108 (90 BASE) MCG/ACT inhaler Inhale 1-2 puffs into the lungs every 6 (six) hours as needed for wheezing. 1 Inhaler 3  . carvedilol (COREG) 25 MG tablet Take 1 tablet (25 mg total) by mouth 2 (two) times daily with a meal. 60 tablet 3  . chlorpheniramine-HYDROcodone (TUSSIONEX PENNKINETIC ER) 10-8 MG/5ML SUER Take 5 mLs by mouth 2 (two) times daily as needed for cough.    . colchicine 0.6 MG tablet Take 1 tablet (0.6 mg total) by mouth 2 (two) times daily. 10 tablet 0  . fluticasone (FLONASE) 50 MCG/ACT nasal spray Place 2 sprays into the nose daily. 16 g 2  . furosemide (LASIX) 80 MG tablet Take 2 tablets (160 mg total) by mouth 2 (two) times daily. 120 tablet 2  . gabapentin (NEURONTIN) 300 MG capsule Take 1 capsule by mouth 3 (three) times daily.  0  . HYDROcodone-acetaminophen (NORCO/VICODIN) 5-325 MG tablet Take 1-2 tablets by mouth every 6 (six) hours as needed for severe pain. 20 tablet 0  . insulin aspart (NOVOLOG PENFILL) cartridge Inject 5 Units into the skin 3 (three) times daily with meals. 15 mL 11  . Insulin Glargine (LANTUS) 100 UNIT/ML Solostar Pen Inject 10 Units into the skin daily at 10 pm. 15 mL 0  . Insulin Pen Needle 31G X 6 MM MISC 1 Device by Does not apply route 2 (two) times daily. 60 each 3  . methylPREDNISolone (MEDROL DOSEPAK) 4 MG TBPK tablet 6 po on day 1, then decrease by 1 per day 21 tablet 0  . metolazone (ZAROXOLYN) 5 MG tablet Take 5 mg by mouth every other day.    . Multiple Vitamin (MULTIVITAMIN WITH MINERALS) TABS tablet Take 2  tablets by mouth daily.    [name] oxyCODONE-acetaminophen (PERCOCET/ROXICET) 5-325 MG tablet Take 2 tablets by mouth every 4 (four) hours as needed. 10 tablet 0  . potassium chloride SA (KLOR-CON M20) 20 MEQ tablet Take 1 tablet (20 mEq total) by mouth daily. 90 tablet 3  . rivaroxaban (XARELTO) 20 MG TABS tablet Take 1 tablet (20 mg total) by mouth daily with supper. 30 tablet 5  . hydrALAZINE (APRESOLINE) 50 MG tablet Take 1 tablet (50 mg total) by mouth 2 (two) times daily. 60 tablet 5  . isosorbide dinitrate (ISORDIL) 10 MG tablet Take 1 tablet (10 mg total) by mouth 2 (two) times daily. 60 tablet 5   No current facility-administered medications for this visit.     LABS/IMAGING: No results found for this or any previous visit (from the past 48 hour(s)). No results found.  WEIGHTS: Wt Readings from Last 3 Encounters:  03/20/16 291 lb 6.4 oz (132.2 kg)  03/18/16 294 lb (133.4  kg)  11/11/15 283 lb 3.2 oz (128.5 kg)    VITALS: BP (!) 145/95   Pulse 82   Ht 5\' 11"  (1.803 m)   Wt 291 lb 6.4 oz (132.2 kg)   BMI 40.64 kg/m   EXAM: General appearance: alert, no distress and morbidly obese Neck: no carotid bruit, no JVD and thyroid not enlarged, symmetric, no tenderness/mass/nodules Lungs: clear to auscultation bilaterally Heart: regular rate and rhythm, S1, S2 normal, no murmur, click, rub or gallop Abdomen: soft, non-tender; bowel sounds normal; no masses,  no organomegaly Extremities: edema 1+ tense edema Pulses: 2+ and symmetric Skin: Skin color, texture, turgor normal. No rashes or lesions Neurologic: Grossly normal Psych: Pleasant  EKG: Normal sinus rhythm at 82  ASSESSMENT: 1. Paroxysmal atrial flutter status post TEE cardioversion 2. Nonischemic cardiomyopathy EF 25-35% 3. Chronic anticoagulation on warfarin - CHADSVASC score of 5 4. Acute kidney injury 5. Type 2 diabetes 6. Dyslipidemia 7. Hypertensive heart disease with heart failure  PLAN: 1.   John Bailey  continues to have uncontrolled high blood pressure. His may be related to medication noncompliance although he says he is using the medicines regularly. He's recently had some weight gain and saw his nephrologist who added metolazone every other day to his diuretic. I would like to recheck LV function to see if there's been improvement. We'll also adjust his medication, namely starting him on isosorbide dinitrate 10 mg twice a day and hydralazine 50 mg twice a day in place of his BiDil dose. This could feasibly be increased to 3 times a day if compliance is good.  Follow-up in 1 month.  Pixie Casino, MD, [facility] Attending Cardiologist [place] 03/20/2016, 12:45 PM

## 2016-04-10 ENCOUNTER — Other Ambulatory Visit (HOSPITAL_COMMUNITY): Payer: BLUE CROSS/BLUE SHIELD

## 2016-04-21 ENCOUNTER — Ambulatory Visit: Payer: BLUE CROSS/BLUE SHIELD | Admitting: Internal Medicine

## 2016-04-22 ENCOUNTER — Ambulatory Visit (HOSPITAL_COMMUNITY): Payer: BLUE CROSS/BLUE SHIELD | Attending: Internal Medicine

## 2016-05-13 ENCOUNTER — Telehealth: Payer: Self-pay | Admitting: Internal Medicine

## 2016-05-13 NOTE — Telephone Encounter (Signed)
Attempt to return call-no answer, lmtcb. 

## 2016-05-13 NOTE — Telephone Encounter (Signed)
Pt had problems with his back and did not have his Echo. Pt wants to now if he should still keep his appt for tomorrow?

## 2016-05-14 ENCOUNTER — Ambulatory Visit: Payer: BLUE CROSS/BLUE SHIELD | Admitting: Internal Medicine

## 2016-05-15 ENCOUNTER — Ambulatory Visit (INDEPENDENT_AMBULATORY_CARE_PROVIDER_SITE_OTHER): Payer: BLUE CROSS/BLUE SHIELD | Admitting: Orthopedic Surgery

## 2016-05-15 ENCOUNTER — Encounter (INDEPENDENT_AMBULATORY_CARE_PROVIDER_SITE_OTHER): Payer: Self-pay | Admitting: Orthopedic Surgery

## 2016-05-15 DIAGNOSIS — M2550 Pain in unspecified joint: Secondary | ICD-10-CM

## 2016-05-15 DIAGNOSIS — M79641 Pain in right hand: Secondary | ICD-10-CM | POA: Diagnosis not present

## 2016-05-15 LAB — CBC WITH DIFFERENTIAL/PLATELET
Basophils Absolute: 0 cells/uL (ref 0–200)
Basophils Relative: 0 %
Eosinophils Absolute: 152 cells/uL (ref 15–500)
Eosinophils Relative: 2 %
HCT: 36.8 % — ABNORMAL LOW (ref 38.5–50.0)
HEMOGLOBIN: 11.9 g/dL — AB (ref 13.2–17.1)
LYMPHS ABS: 1672 {cells}/uL (ref 850–3900)
Lymphocytes Relative: 22 %
MCH: 27.9 pg (ref 27.0–33.0)
MCHC: 32.3 g/dL (ref 32.0–36.0)
MCV: 86.2 fL (ref 80.0–100.0)
MPV: 9.3 fL (ref 7.5–12.5)
Monocytes Absolute: 456 cells/uL (ref 200–950)
Monocytes Relative: 6 %
NEUTROS ABS: 5320 {cells}/uL (ref 1500–7800)
Neutrophils Relative %: 70 %
PLATELETS: 446 10*3/uL — AB (ref 140–400)
RBC: 4.27 MIL/uL (ref 4.20–5.80)
RDW: 16.9 % — ABNORMAL HIGH (ref 11.0–15.0)
WBC: 7.6 10*3/uL (ref 3.8–10.8)

## 2016-05-15 NOTE — Progress Notes (Signed)
Office Visit Note   Patient: John Bailey           Date of Birth: 1971/03/15           MRN: [id number] Visit Date: 05/15/2016 Requested by: John Bailey [place], [place] [id number] PCP: John Merino, Bailey  Subjective: No chief complaint on file.   HPI: John Bailey is a 46 year old patient with left hand pain and right hand pain and right elbow pain.  He's been seen in the past and diagnosed with potential tennis elbow.  He states that the pain from the elbow moved down to the hand and he reports decreased grip strength and inability to open jars.  He also describes very focal and localized swelling of the left third DIP joint which is new.  He denies much in way of back pain or other lower 70 symptoms.  Currently the only thing he is taking his gabapentin for the problem.              ROS: All systems reviewed are negative as they relate to the chief complaint within the history of present illness.  Patient denies  fevers or chills.   Assessment & Plan: Visit Diagnoses:  1. 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  Plan is for treatment of symptoms with 2 access.  Samples provided and prescription sent to the pharmacy.  He also needs blood work including sedimentation rate and CRP C-reactive protein rheumatoid factor and a uric acid anti-CCP antibody CBC dif.  I'll call him with those results and potentially arrange for rheumatologic referral..  Follow-Up Instructions: Return if symptoms worsen or fail to improve.   Orders:  No orders of the defined types were placed in this encounter.  No orders of the defined types were placed in this encounter.     Procedures: No procedures performed   Clinical Data: No additional findings.  Objective: Vital Signs: There were no vitals taken for this visit.  Physical Exam:   Constitutional: Patient appears  well-developed HEENT:  Head: Normocephalic Eyes:EOM are normal Neck: Normal range of motion Cardiovascular: Normal rate Pulmonary/chest: Effort normal Neurologic: Patient is alert Skin: Skin is warm Psychiatric: Patient has normal mood and affect    Ortho Exam: Orthopedic exam demonstrates good elbow range of motion right and left.  On the hand the patient does have some MCP swelling on the right-hand side.  Also has DIP swelling which is asymmetric on the left middle finger.  Radial pulses intact bilaterally.  Grip strength is diminished bilaterally.  Wrist range of motion otherwise normal.  No tenderness at the distal radioulnar joint.  Specialty Comments:  No specialty comments available.  Imaging: No results found.   PMFS History: Patient Active Problem List   Diagnosis Date Noted  . Chronic diastolic CHF (congestive heart failure) ([place]) 03/20/2016  . Non-ischemic cardiomyopathy ([place]) 11/11/2015  . Long term (current) use of anticoagulants [Z79.01] 08/14/2015  . Insulin-dependent diabetes mellitus with renal complications ([place]) 23/55/7322  . Atrial flutter with rapid ventricular response ([place]) 08/06/2015  . Hyponatremia 07/30/2015  . AKI (acute kidney injury) ([place]) 07/30/2015  . Hyperkalemia 07/30/2015  . Acute combined systolic (congestive) and diastolic (congestive) heart failure 07/30/2015  . Morbid obesity ([place]) 07/30/2015  . Paroxysmal atrial fibrillation ([place]) 07/30/2015  . 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 Hypertensive heart disease with heart failure ([place]) 07/14/2012  .  Dyspnea 07/14/2012  . Hyperlipidemia 07/14/2012   Past Medical History:  Diagnosis Date  . Atrial flutter with rapid ventricular response ([place]) 08/06/2015  . Diabetes ([place])   . Hypertension     Family History  Problem Relation Age of Onset  . Heart disease Mother   . Hyperlipidemia Father   . Hypertension Father     Past Surgical History:   Procedure Laterality Date  . CARDIOVERSION N/A 08/01/2015   Procedure: CARDIOVERSION;  Surgeon: Josue Hector, Bailey;  Location: [facility] ENDOSCOPY;  Service: Cardiovascular;  Laterality: N/A;  . CHOLECYSTECTOMY    . TEE WITHOUT CARDIOVERSION N/A 08/01/2015   Procedure: TRANSESOPHAGEAL ECHOCARDIOGRAM (TEE);  Surgeon: Josue Hector, Bailey;  Location: [facility] ENDOSCOPY;  Service: Cardiovascular;  Laterality: N/A;   Social History   Occupational History  . Not on file.   Social History Main Topics  . Smoking status: Never Smoker  . Smokeless tobacco: Never Used  . Alcohol use No  . Drug use: No  . Sexual activity: Yes

## 2016-05-15 NOTE — Addendum Note (Signed)
Addended by: Jacklyn Shell on: 05/15/2016 11:03 AM   Modules accepted: Orders

## 2016-05-15 NOTE — Telephone Encounter (Signed)
Patient was seen 05-14-16.

## 2016-05-16 LAB — SEDIMENTATION RATE: SED RATE: 87 mm/h — AB (ref 0–15)

## 2016-05-16 LAB — URIC ACID: Uric Acid, Serum: 11.6 mg/dL — ABNORMAL HIGH (ref 4.0–8.0)

## 2016-05-18 LAB — RHEUMATOID FACTOR: RHEUMATOID FACTOR: 45 [IU]/mL — AB (ref <14)

## 2016-05-18 LAB — C-REACTIVE PROTEIN: CRP: 20.2 mg/L — ABNORMAL HIGH (ref <8.0)

## 2016-05-18 LAB — CYCLIC CITRUL PEPTIDE ANTIBODY, IGG: Cyclic Citrullin Peptide Ab: <16 Units

## 2016-05-18 LAB — ANA: ANA: NEGATIVE

## 2016-05-19 NOTE — Addendum Note (Signed)
Addended byBrand Males on: 05/19/2016 09:08 AM   Modules accepted: Orders

## 2016-05-21 ENCOUNTER — Encounter (INDEPENDENT_AMBULATORY_CARE_PROVIDER_SITE_OTHER): Payer: Self-pay | Admitting: *Deleted

## 2016-06-23 ENCOUNTER — Telehealth (INDEPENDENT_AMBULATORY_CARE_PROVIDER_SITE_OTHER): Payer: Self-pay | Admitting: Orthopedic Surgery

## 2016-06-23 MED ORDER — IBUPROFEN-FAMOTIDINE 800-26.6 MG PO TABS: 1.0000 | ORAL_TABLET | Freq: Two times a day (BID) | ORAL | 0 refills | Status: DC

## 2016-06-23 NOTE — Addendum Note (Signed)
Addended byLaurann Montana on: 06/23/2016 04:18 PM   Modules accepted: Orders

## 2016-06-23 NOTE — Telephone Encounter (Signed)
Patient came in requesting more samples or a script for duexis, spoke with lauren and she sent in a request for the patient

## 2016-06-25 ENCOUNTER — Ambulatory Visit (HOSPITAL_COMMUNITY): Payer: BLUE CROSS/BLUE SHIELD | Attending: Internal Medicine

## 2016-06-25 ENCOUNTER — Other Ambulatory Visit: Payer: Self-pay

## 2016-06-25 DIAGNOSIS — E119 Type 2 diabetes mellitus without complications: Secondary | ICD-10-CM | POA: Insufficient documentation

## 2016-06-25 DIAGNOSIS — I313 Pericardial effusion (noninflammatory): Secondary | ICD-10-CM | POA: Insufficient documentation

## 2016-06-25 DIAGNOSIS — E785 Hyperlipidemia, unspecified: Secondary | ICD-10-CM | POA: Diagnosis not present

## 2016-06-25 DIAGNOSIS — Z6841 Body Mass Index (BMI) 40.0 and over, adult: Secondary | ICD-10-CM | POA: Diagnosis not present

## 2016-06-25 DIAGNOSIS — I428 Other cardiomyopathies: Secondary | ICD-10-CM | POA: Diagnosis not present

## 2016-06-25 DIAGNOSIS — I517 Cardiomegaly: Secondary | ICD-10-CM | POA: Insufficient documentation

## 2016-06-25 DIAGNOSIS — I429 Cardiomyopathy, unspecified: Secondary | ICD-10-CM | POA: Diagnosis present

## 2016-06-25 DIAGNOSIS — I480 Paroxysmal atrial fibrillation: Secondary | ICD-10-CM | POA: Insufficient documentation

## 2016-06-25 MED ORDER — PERFLUTREN LIPID MICROSPHERE
1.0000 mL | INTRAVENOUS | Status: AC | PRN
  Administered 2016-06-25: 1 mL via INTRAVENOUS

## 2016-07-31 ENCOUNTER — Encounter (INDEPENDENT_AMBULATORY_CARE_PROVIDER_SITE_OTHER): Payer: Self-pay

## 2016-07-31 ENCOUNTER — Other Ambulatory Visit: Payer: Self-pay | Admitting: Internal Medicine

## 2016-07-31 ENCOUNTER — Encounter: Payer: Self-pay | Admitting: Internal Medicine

## 2016-07-31 ENCOUNTER — Ambulatory Visit (INDEPENDENT_AMBULATORY_CARE_PROVIDER_SITE_OTHER): Payer: BLUE CROSS/BLUE SHIELD | Admitting: Internal Medicine

## 2016-07-31 VITALS — BP 153/93 | HR 86 | Ht 71.0 in | Wt 312.8 lb

## 2016-07-31 DIAGNOSIS — I480 Paroxysmal atrial fibrillation: Secondary | ICD-10-CM

## 2016-07-31 DIAGNOSIS — I428 Other cardiomyopathies: Secondary | ICD-10-CM | POA: Diagnosis not present

## 2016-07-31 DIAGNOSIS — Z794 Long term (current) use of insulin: Secondary | ICD-10-CM

## 2016-07-31 DIAGNOSIS — IMO0001 Reserved for inherently not codable concepts without codable children: Secondary | ICD-10-CM

## 2016-07-31 DIAGNOSIS — I5032 Chronic diastolic (congestive) heart failure: Secondary | ICD-10-CM | POA: Diagnosis not present

## 2016-07-31 DIAGNOSIS — E1129 Type 2 diabetes mellitus with other diabetic kidney complication: Secondary | ICD-10-CM | POA: Diagnosis not present

## 2016-07-31 NOTE — Patient Instructions (Signed)
Dr. Debara Pickett recommends Ibuprofen 800mg  & Famotidine (Pepcid) over the counter in place of Duexis  Your physician recommends that you continue on your current medications as directed. Please refer to the Current Medication list given to you today.  Your physician wants you to follow-up in: 6 months with Dr. Debara Pickett. You will receive a reminder letter in the mail two months in advance. If you don't receive a letter, please call our office to schedule the follow-up appointment.

## 2016-07-31 NOTE — Progress Notes (Signed)
OFFICE NOTE  Chief Complaint:  Follow-up, no complaints  Primary Care Physician: [name], MD  HPI:  KAISYN MILLEA is a 46 y.o. male patient, previously followed by Dr. Acie Fredrickson, who has requested to see me after he week-long hospitalization for volume overload, atrial flutter with RVR and acute systolic congestive heart failure - LVEF now 35%. He also developed acute kidney injury and is being followed by Dr. Moshe Cipro. Past medical history is also significant for insulin-dependent diabetes, morbid obesity, LVH and dyslipidemia. He was admitted for atrial flutter with rapid ventricular response and underwent TEE cardioversion on 08/01/2015 which showed global hypokinesis and EF 25-30%. Surface echo a day prior to that showed EF of 35-40%. He was successfully cardioverted and has maintained sinus rhythm since that time. He had marked volume overload and diuresed more than 25 pounds. He continues to lose weight in fact his discharge weight was 136 kg and is currently 127 kg. He denies chest pain or shortness of breath. His INR was tested today as he is on warfarin and found to be 1.7. Adjustments were made to his Coumadin. Blood pressure remains elevated.  11/11/2015  Mr. Pittinger returns today for follow-up. He was seen last 3 months ago in hospital follow-up and since that time his been doing well. He denies any worsening shortness of breath or chest pain. Weight is actually up about 3 pounds since I last saw him. He was recently seen by Dr. Moshe Cipro at [facility] She noted that Lyme status appears to be somewhat heavy despite being on high-dose Lasix, but he may be fairly euvolemic at this point. His hemoglobin A1c was tested and elevated at 7.6. Creatinine however did look somewhat better at 1.74. He was placed on warfarin because of atrial flutter but has remained in sinus rhythm since discharge. I believe warfarin was chosen due to his chronic kidney disease, but  we have been monitoring his INRs since discharge and over the past 3 months he has been rarely between 2 and 3 for therapeutic INR. He reports compliance with medications and says that he takes them regularly but may forget to take them on time yet he still remembers to take the medicines, this is despite some concern about chronic noncompliance. My concern is that he is not being adequately treated with warfarin as were unable to achieve a therapeutic INR despite being on essentially 10 mg of warfarin daily. Given that he has had improvement in his renal function, we discussed the possibility of going on to a novel oral anticoagulant.  03/20/2016  Mr. Chance returns today for follow-up. We had planned on reviewing a repeat echo today however that was not yet obtained. He recently saw his nephrologist and was noted to be hypertensive with excess volume and was started on metolazone in addition to his Lasix. Is not clear whether his LV function has improved. He is maintaining sinus rhythm. He denies any chest pain or worsening shortness of breath. He reported that his BiDil is no longer going to be [employer]. He is close to running out of that medication. He seems to be tolerating Xarelto without any significant bleeding problems.  07/31/2016  Mr. Sangiovanni was seen in follow-up today he reports that he is not any more recurrent atrial fibrillation is aware of. He denies any word worsening shortness of breath. Blood pressure today is a little bit elevated however says is better controlled at home. Weight is up actually over 20 pounds.  He does not note any edema rather he's not been able to exercise because of problems with his joints and has been on steroids therefore he says his appetite is increased. He denies any bleeding problems on Xarelto. He is been followed closely by Dr. Moshe Cipro who is adjusting his diuretics. Creatinine is actually improved.  PMHx:  Past Medical History:  Diagnosis Date   . Atrial flutter with rapid ventricular response ([place]) 08/06/2015  . Diabetes ([place])   . Hypertension     Past Surgical History:  Procedure Laterality Date  . CARDIOVERSION N/A 08/01/2015   Procedure: CARDIOVERSION;  Surgeon: Josue Hector, MD;  Location: [facility] ENDOSCOPY;  Service: Cardiovascular;  Laterality: N/A;  . CHOLECYSTECTOMY    . TEE WITHOUT CARDIOVERSION N/A 08/01/2015   Procedure: TRANSESOPHAGEAL ECHOCARDIOGRAM (TEE);  Surgeon: Josue Hector, MD;  Location[facility] ENDOSCOPY;  Service: Cardiovascular;  Laterality: N/A;    FAMHx:  Family History  Problem Relation Age of Onset  . Heart disease Mother   . Hyperlipidemia Father   . Hypertension Father     SOCHx:   reports that he has never smoked. He has never used smokeless tobacco. He reports that he does not drink alcohol or use drugs.  ALLERGIES:  No Known Allergies  ROS: Pertinent items noted in HPI and remainder of comprehensive ROS otherwise negative.  HOME MEDS: Current Outpatient Prescriptions  Medication Sig Dispense Refill  . albuterol (PROVENTIL HFA;VENTOLIN HFA) 108 (90 BASE) MCG/ACT inhaler Inhale 1-2 puffs into the lungs every 6 (six) hours as needed for wheezing. 1 Inhaler 3  . carvedilol (COREG) 25 MG tablet Take 1 tablet (25 mg total) by mouth 2 (two) times daily with a meal. 60 tablet 3  . chlorpheniramine-HYDROcodone (TUSSIONEX PENNKINETIC ER) 10-8 MG/5ML SUER Take 5 mLs by mouth 2 (two) times daily as needed for cough.    . colchicine 0.6 MG tablet Take 1 tablet (0.6 mg total) by mouth 2 (two) times daily. 10 tablet 0  . fluticasone (FLONASE) 50 MCG/ACT nasal spray Place 2 sprays into the nose daily. 16 g 2  . furosemide (LASIX) 80 MG tablet Take 2 tablets (160 mg total) by mouth 2 (two) times daily. 120 tablet 2  . gabapentin (NEURONTIN) 300 MG capsule Take 1 capsule by mouth 3 (three) times daily.  0  . hydrALAZINE (APRESOLINE) 50 MG tablet Take 1 tablet (50 mg total) by mouth 2 (two) times daily. 60  tablet 5  . HYDROcodone-acetaminophen (NORCO/VICODIN) 5-325 MG tablet Take 1-2 tablets by mouth every 6 (six) hours as needed for severe pain. 20 tablet 0  . Ibuprofen-Famotidine (DUEXIS) 800-26.6 MG TABS Take 1 tablet by mouth 2 (two) times daily. 60 tablet 0  . insulin aspart (NOVOLOG PENFILL) cartridge Inject 5 Units into the skin 3 (three) times daily with meals. 15 mL 11  . Insulin Glargine (LANTUS) 100 UNIT/ML Solostar Pen Inject 10 Units into the skin daily at 10 pm. 15 mL 0  . Insulin Pen Needle 31G X 6 MM MISC 1 Device by Does not apply route 2 (two) times daily. 60 each 3  . isosorbide dinitrate (ISORDIL) 10 MG tablet Take 1 tablet (10 mg total) by mouth 2 (two) times daily. 60 tablet 5  . methylPREDNISolone (MEDROL DOSEPAK) 4 MG TBPK tablet 6 po on day 1, then decrease by 1 per day 21 tablet 0  . metolazone (ZAROXOLYN) 5 MG tablet Take 5 mg by mouth every other day.    . Multiple Vitamin (MULTIVITAMIN  WITH MINERALS) TABS tablet Take 2 tablets by mouth daily.    [name] oxyCODONE-acetaminophen (PERCOCET/ROXICET) 5-325 MG tablet Take 2 tablets by mouth every 4 (four) hours as needed. 10 tablet 0  . potassium chloride SA (KLOR-CON M20) 20 MEQ tablet Take 1 tablet (20 mEq total) by mouth daily. 90 tablet 3  . XARELTO 20 MG TABS tablet take 1 tablet by mouth once daily WITH SUPPER 30 tablet 1   No current facility-administered medications for this visit.     LABS/IMAGING: No results found for this or any previous visit (from the past 48 hour(s)). No results found.  WEIGHTS: Wt Readings from Last 3 Encounters:  07/31/16 (!) 312 lb 12.8 oz (141.9 kg)  03/20/16 291 lb 6.4 oz (132.2 kg)  03/18/16 294 lb (133.4 kg)    VITALS: BP (!) 153/93   Pulse 86   Ht 5\' 11"  (1.803 m)   Wt (!) 312 lb 12.8 oz (141.9 kg)   BMI 43.63 kg/m   EXAM: General appearance: alert, no distress and morbidly obese Neck: no carotid bruit and no JVD Lungs: clear to auscultation bilaterally Heart: regular rate  and rhythm, S1, S2 normal, no murmur, click, rub or gallop Abdomen: soft, non-tender; bowel sounds normal; no masses,  no organomegaly and obese Extremities: edema 1+ edema - compression stockings in place Pulses: 2+ and symmetric Skin: Skin color, texture, turgor normal. No rashes or lesions Neurologic: Grossly normal Psych: Pleasant  EKG: Deferred  ASSESSMENT: 1. Paroxysmal atrial flutter status post TEE cardioversion 2. Nonischemic cardiomyopathy EF 25-35% 3. Chronic anticoagulation on Xarelto - CHADSVASC score of 5 4. Acute kidney injury 5. Type 2 diabetes 6. Dyslipidemia 7. Hypertensive heart disease with heart failure  PLAN: 1.   Mr. Trompeter seems to be doing well after his cardioversion. LVEF is improved from 25-35% up to 50-55% by echo. High filling pressures were noted on his echo however he is undergoing diuresis by his nephrologist. Blood pressure was elevated and has improved but still is not at goal. He is on Xarelto now which is tolerating. No changes to his medicines today. He needs to continue to work on weight loss.  Follow-up in 6 months.  Pixie Casino, MD, [facility] Attending Cardiologist [place] 07/31/2016, 1:38 PM

## 2016-10-09 ENCOUNTER — Other Ambulatory Visit: Payer: Self-pay | Admitting: Internal Medicine

## 2016-10-09 DIAGNOSIS — I110 Hypertensive heart disease with heart failure: Secondary | ICD-10-CM

## 2016-10-12 MED ORDER — RIVAROXABAN 20 MG PO TABS: ORAL_TABLET | ORAL | 0 refills | Status: DC

## 2016-10-12 NOTE — Telephone Encounter (Signed)
BMP pending 

## 2016-11-10 ENCOUNTER — Telehealth: Payer: Self-pay | Admitting: Internal Medicine

## 2016-11-22 ENCOUNTER — Emergency Department (HOSPITAL_BASED_OUTPATIENT_CLINIC_OR_DEPARTMENT_OTHER): Payer: BLUE CROSS/BLUE SHIELD

## 2016-11-22 ENCOUNTER — Encounter (HOSPITAL_BASED_OUTPATIENT_CLINIC_OR_DEPARTMENT_OTHER): Payer: Self-pay | Admitting: *Deleted

## 2016-11-22 ENCOUNTER — Emergency Department (HOSPITAL_BASED_OUTPATIENT_CLINIC_OR_DEPARTMENT_OTHER)
Admission: EM | Admit: 2016-11-22 | Discharge: 2016-11-22 | Disposition: A | Discharge status: Home / Self Care | Payer: BLUE CROSS/BLUE SHIELD | Attending: Emergency Medicine | Admitting: Emergency Medicine

## 2016-11-22 DIAGNOSIS — K5900 Constipation, unspecified: Secondary | ICD-10-CM | POA: Insufficient documentation

## 2016-11-22 DIAGNOSIS — E119 Type 2 diabetes mellitus without complications: Secondary | ICD-10-CM | POA: Insufficient documentation

## 2016-11-22 DIAGNOSIS — Z79899 Other long term (current) drug therapy: Secondary | ICD-10-CM | POA: Insufficient documentation

## 2016-11-22 DIAGNOSIS — Z794 Long term (current) use of insulin: Secondary | ICD-10-CM | POA: Diagnosis not present

## 2016-11-22 DIAGNOSIS — I110 Hypertensive heart disease with heart failure: Secondary | ICD-10-CM | POA: Diagnosis not present

## 2016-11-22 DIAGNOSIS — I5042 Chronic combined systolic (congestive) and diastolic (congestive) heart failure: Secondary | ICD-10-CM | POA: Insufficient documentation

## 2016-11-22 DIAGNOSIS — Z7901 Long term (current) use of anticoagulants: Secondary | ICD-10-CM | POA: Insufficient documentation

## 2016-11-22 MED ORDER — BISACODYL 10 MG RE SUPP
10.0000 mg | Freq: Once | RECTAL | Status: AC
  Administered 2016-11-22: 10 mg via RECTAL
  Filled 2016-11-22: qty 1

## 2016-11-22 MED ORDER — DOCUSATE SODIUM 100 MG PO CAPS: 100.0000 mg | ORAL_CAPSULE | Freq: Two times a day (BID) | ORAL | 0 refills | Status: DC

## 2016-11-22 MED ORDER — LACTULOSE 10 GM/15ML PO SOLN: 10.0000 g | Freq: Two times a day (BID) | ORAL | 0 refills | Status: DC | PRN

## 2016-11-22 MED ORDER — MAGNESIUM CITRATE PO SOLN
1.0000 | Freq: Once | ORAL | Status: AC
  Administered 2016-11-22: 1 via ORAL
  Filled 2016-11-22: qty 296

## 2016-11-22 NOTE — ED Notes (Addendum)
EDP into room 

## 2016-11-22 NOTE — ED Notes (Signed)
Lying on L side, retaining enema, tolerated well, NAD, calm, interactive, CBIR, no dyspnea noted.

## 2016-11-22 NOTE — ED Notes (Signed)
Rectal exam in progress.

## 2016-11-22 NOTE — ED Provider Notes (Addendum)
[facility] DEPT [facility] Provider Note   CSN: [id number] Arrival date & time: 11/22/16  7124     History   Chief Complaint Chief Complaint  Patient presents with  . Constipation    HPI John Bailey is a 46 y.o. male.  Patient presents to the ER for evaluation of constipation. Patient reports that he has not had a normal bowel movement for 14 days. He has had progressively worsening rectal pain and sensation of stool in his rectum without any inability to pass stool. He has not had any abdominal pain, nausea, vomiting, fever.      Past Medical History:  Diagnosis Date  . Atrial flutter with rapid ventricular response ([place]) 08/06/2015  . Diabetes ([place])   . Hypertension     Patient Active Problem List   Diagnosis Date Noted  . Chronic diastolic CHF (congestive heart failure) ([place]) 03/20/2016  . Nonischemic cardiomyopathy ([place]) 11/11/2015  . Long term (current) use of anticoagulants [Z79.01] 08/14/2015  . Insulin-dependent diabetes mellitus with renal complications ([place]) 58/11/9831  . Atrial flutter with rapid ventricular response ([place]) 08/06/2015  . Hyponatremia 07/30/2015  . AKI (acute kidney injury) ([place]) 07/30/2015  . Hyperkalemia 07/30/2015  . Acute combined systolic (congestive) and diastolic (congestive) heart failure ([place]) 07/30/2015  . Morbid obesity ([place]) 07/30/2015  . Paroxysmal atrial fibrillation ([place]) 07/30/2015  . 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 Hypertensive heart disease with heart failure ([place]) 07/14/2012  . Dyspnea 07/14/2012  . Hyperlipidemia 07/14/2012    Past Surgical History:  Procedure Laterality Date  . CARDIOVERSION N/A 08/01/2015   Procedure: CARDIOVERSION;  Surgeon: Josue Hector, MD;  Location: [facility] ENDOSCOPY;  Service: Cardiovascular;  Laterality: N/A;  . CHOLECYSTECTOMY    . TEE WITHOUT CARDIOVERSION N/A 08/01/2015   Procedure: TRANSESOPHAGEAL ECHOCARDIOGRAM (TEE);  Surgeon: Josue Hector,  MD;  Location: [facility] ENDOSCOPY;  Service: Cardiovascular;  Laterality: N/A;       Home Medications    Prior to Admission medications   Medication Sig Start Date End Date Taking? Authorizing Provider  albuterol (PROVENTIL HFA;VENTOLIN HFA) 108 (90 BASE) MCG/ACT inhaler Inhale 1-2 puffs into the lungs every 6 (six) hours as needed for wheezing. 05/30/14   Nahser, Wonda Cheng, MD  carvedilol (COREG) 25 MG tablet Take 1 tablet (25 mg total) by mouth 2 (two) times daily with a meal. 08/19/15   Hilty, Nadean Corwin, MD  chlorpheniramine-HYDROcodone (TUSSIONEX PENNKINETIC ER) 10-8 MG/5ML SUER Take 5 mLs by mouth 2 (two) times daily as needed for cough.    [provider]  colchicine 0.6 MG tablet Take 1 tablet (0.6 mg total) by mouth 2 (two) times daily. 03/18/16   Tanna Furry, MD  fluticasone (FLONASE) 50 MCG/ACT nasal spray Place 2 sprays into the nose daily. 12/29/12   Janne Napoleon, NP  furosemide (LASIX) 80 MG tablet Take 2 tablets (160 mg total) by mouth 2 (two) times daily. 08/19/15   Hilty, Nadean Corwin, MD  gabapentin (NEURONTIN) 300 MG capsule Take 1 capsule by mouth 3 (three) times daily. 09/09/15   [provider]  hydrALAZINE (APRESOLINE) 50 MG tablet take 1 tablet by mouth twice a day 10/09/16   Pixie Casino, MD  HYDROcodone-acetaminophen (NORCO/VICODIN) 5-325 MG tablet Take 1-2 tablets by mouth every 6 (six) hours as needed for severe pain. 09/05/15   Blanchie Dessert, MD  Ibuprofen-Famotidine (DUEXIS) 800-26.6 MG TABS Take 1 tablet by mouth 2 (two) times daily. 06/23/16   Meredith Pel, MD  insulin aspart (NOVOLOG PENFILL) cartridge Inject 5 Units into the skin 3 (three) times daily with meals. 08/08/15   Charlynne Cousins, MD  Insulin Glargine (LANTUS) 100 UNIT/ML Solostar Pen Inject 10 Units into the skin daily at 10 pm. 08/08/15   Charlynne Cousins, MD  Insulin Pen Needle 31G X 6 MM MISC 1 Device by Does not apply route 2 (two) times daily. 08/08/15   Charlynne Cousins, MD    isosorbide dinitrate (ISORDIL) 10 MG tablet take 1 tablet by mouth twice a day 10/09/16   Pixie Casino, MD  methylPREDNISolone (MEDROL DOSEPAK) 4 MG TBPK tablet 6 po on day 1, then decrease by 1 per day 03/18/16   Tanna Furry, MD  metolazone (ZAROXOLYN) 5 MG tablet Take 5 mg by mouth every other day.    [provider]  Multiple Vitamin (MULTIVITAMIN WITH MINERALS) TABS tablet Take 2 tablets by mouth daily.    [provider]  oxyCODONE-acetaminophen (PERCOCET/ROXICET) 5-325 MG tablet Take 2 tablets by mouth every 4 (four) hours as needed. 03/18/16   Tanna Furry, MD  potassium chloride SA (KLOR-CON M20) 20 MEQ tablet Take 1 tablet (20 mEq total) by mouth daily. 08/19/15   Hilty, Nadean Corwin, MD  rivaroxaban (XARELTO) 20 MG TABS tablet take 1 tablet by mouth once daily WITH SUPPER 10/12/16   Hilty, Nadean Corwin, MD    Family History Family History  Problem Relation Age of Onset  . Heart disease Mother   . Hyperlipidemia Father   . Hypertension Father     Social History Social History  Substance Use Topics  . Smoking status: Never Smoker  . Smokeless tobacco: Never Used  . Alcohol use No     Allergies   Patient has no known allergies.   Review of Systems Review of Systems  Gastrointestinal: Positive for constipation and rectal pain.  All other systems reviewed and are negative.    Physical Exam Updated Vital Signs BP (!) 128/92 (BP Location: Left Arm)   Pulse 75   Temp 97.6 F (36.4 C) (Oral)   Resp 18   SpO2 96%   Physical Exam  Constitutional: He is oriented to person, place, and time. He appears well-developed and well-nourished. No distress.  HENT:  Head: Normocephalic and atraumatic.  Right Ear: Hearing normal.  Left Ear: Hearing normal.  Nose: Nose normal.  Mouth/Throat: Oropharynx is clear and moist and mucous membranes are normal.  Eyes: Pupils are equal, round, and reactive to light. Conjunctivae and EOM are normal.  Neck: Normal range of  motion. Neck supple.  Cardiovascular: Regular rhythm, S1 normal and S2 normal.  Exam reveals no gallop and no friction rub.   No murmur heard. Pulmonary/Chest: Effort normal and breath sounds normal. No respiratory distress. He exhibits no tenderness.  Abdominal: Soft. Normal appearance and bowel sounds are normal. There is no hepatosplenomegaly. There is no tenderness. There is no rebound, no guarding, no tenderness at McBurney's point and negative Murphy's sign. No hernia.  Genitourinary: Rectal exam shows no external hemorrhoid and no internal hemorrhoid.  Genitourinary Comments: Large amount of soft stool in rectum  Musculoskeletal: Normal range of motion.  Neurological: He is alert and oriented to person, place, and time. He has normal strength. No cranial nerve deficit or sensory deficit. Coordination normal. GCS eye subscore is 4. GCS verbal subscore is 5. GCS motor subscore is 6.  Skin: Skin is warm, dry and intact. No rash noted. No cyanosis.  Psychiatric: He has a  normal mood and affect. His speech is normal and behavior is normal. Thought content normal.  Nursing note and vitals reviewed.    ED Treatments / Results  Labs (all labs ordered are listed, but only abnormal results are displayed) Labs Reviewed - No data to display  EKG  EKG Interpretation None       Radiology Dg Abd Acute W/chest  Result Date: 11/22/2016 CLINICAL DATA:  Constipation and rectal pain. Last bowel movement 14 days ago. History of diabetes and hypertension. EXAM: DG ABDOMEN ACUTE W/ 1V CHEST COMPARISON:  Chest 01/28/2016 FINDINGS: Shallow inspiration. Cardiac enlargement. Pulmonary vascular congestion with perihilar interstitial changes likely representing edema. No focal consolidation. No blunting of costophrenic angles. No pneumothorax. Scattered gas and stool throughout the colon. No small or large bowel distention. No free intra-abdominal air. No abnormal air-fluid levels. No radiopaque stones.  Visualized bones appear intact. Surgical clips in the right upper quadrant. IMPRESSION: Congestive changes in the heart and lungs with cardiac enlargement, pulmonary vascular congestion, and perihilar edema. Nonobstructive bowel gas pattern. Electronically Signed   By: Lucienne Capers M.D.   On: 11/22/2016 04:15    Procedures Procedures (including critical care time)  Medications Ordered in ED Medications  bisacodyl (DULCOLAX) suppository 10 mg (10 mg Rectal Given 11/22/16 0455)  magnesium citrate solution 1 Bottle (1 Bottle Oral Given 11/22/16 0455)     Initial Impression / Assessment and Plan / ED Course  I have reviewed the triage vital signs and the nursing notes.  Pertinent labs & imaging results that were available during my care of the patient were reviewed by me and considered in my medical decision making (see chart for details).     Patient presents to the emergency department for evaluation of constipation and rectal pain. He reports he has not had a bowel movement for 2 days. He has not been experiencing abdominal pain. Abdominal exam is benign, nontender. Rectal examination reveals a large amount of stool in his rectum, but it is soft and I cannot disimpact. X-ray does not show any evidence of obstruction or bowel perforation.  Patient administered magnesium citrate and Dulcolax suppository here in the ER. He had a very small bowel movement, but still felt like he was full. Patient therefore had a soapsuds enema, after which she had a large bowel movement with resolution of symptoms. Patient will be treated with lactulose and follow-up with gastroenterology.  Final Clinical Impressions(s) / ED Diagnoses   Final diagnoses:  Constipation, unspecified constipation type    New Prescriptions New Prescriptions   No medications on file     Orpah Greek, MD 11/22/16 0510    Orpah Greek, MD 11/22/16 [phone]

## 2016-11-22 NOTE — ED Notes (Signed)
Back from xray, NAD, calm, no changes.

## 2016-11-22 NOTE — ED Triage Notes (Addendum)
Alert, NAD, calm, interactive, resps e/u, speaking in clear complete sentences, no dyspnea noted, skin W&D, VSS,  C/o constipation and rectal pain, last BM 14 days ago, no relief with laxatives or enema, pain worse tonight, (denies: NVD, bleeding, abd pain, sob, fever, dizziness or visual changes). No h/o same. Rates pain 10/10. H/o DM, HTN and CHF. PCP Dr. Julious Oka. EDP into room. Pt preferring to stand.

## 2016-12-02 ENCOUNTER — Other Ambulatory Visit: Payer: Self-pay | Admitting: Cardiology

## 2016-12-02 ENCOUNTER — Emergency Department (HOSPITAL_COMMUNITY): Payer: BLUE CROSS/BLUE SHIELD

## 2016-12-02 ENCOUNTER — Inpatient Hospital Stay (HOSPITAL_COMMUNITY)
Admission: EM | Admit: 2016-12-02 | Discharge: 2016-12-16 | DRG: 291 | Disposition: A | Discharge status: Home / Self Care | Payer: BLUE CROSS/BLUE SHIELD | Attending: Internal Medicine | Admitting: Internal Medicine

## 2016-12-02 ENCOUNTER — Encounter (HOSPITAL_COMMUNITY): Payer: Self-pay | Admitting: *Deleted

## 2016-12-02 DIAGNOSIS — I481 Persistent atrial fibrillation: Secondary | ICD-10-CM | POA: Diagnosis present

## 2016-12-02 DIAGNOSIS — Z79891 Long term (current) use of opiate analgesic: Secondary | ICD-10-CM | POA: Diagnosis not present

## 2016-12-02 DIAGNOSIS — Z79899 Other long term (current) drug therapy: Secondary | ICD-10-CM

## 2016-12-02 DIAGNOSIS — N289 Disorder of kidney and ureter, unspecified: Secondary | ICD-10-CM

## 2016-12-02 DIAGNOSIS — N189 Chronic kidney disease, unspecified: Secondary | ICD-10-CM | POA: Insufficient documentation

## 2016-12-02 DIAGNOSIS — E1165 Type 2 diabetes mellitus with hyperglycemia: Secondary | ICD-10-CM | POA: Diagnosis present

## 2016-12-02 DIAGNOSIS — E876 Hypokalemia: Secondary | ICD-10-CM | POA: Diagnosis present

## 2016-12-02 DIAGNOSIS — E1122 Type 2 diabetes mellitus with diabetic chronic kidney disease: Secondary | ICD-10-CM | POA: Diagnosis present

## 2016-12-02 DIAGNOSIS — L03116 Cellulitis of left lower limb: Secondary | ICD-10-CM | POA: Diagnosis present

## 2016-12-02 DIAGNOSIS — E871 Hypo-osmolality and hyponatremia: Secondary | ICD-10-CM | POA: Diagnosis present

## 2016-12-02 DIAGNOSIS — K5900 Constipation, unspecified: Secondary | ICD-10-CM | POA: Diagnosis present

## 2016-12-02 DIAGNOSIS — Z9119 Patient's noncompliance with other medical treatment and regimen: Secondary | ICD-10-CM

## 2016-12-02 DIAGNOSIS — Z7901 Long term (current) use of anticoagulants: Secondary | ICD-10-CM

## 2016-12-02 DIAGNOSIS — I5043 Acute on chronic combined systolic (congestive) and diastolic (congestive) heart failure: Secondary | ICD-10-CM | POA: Diagnosis present

## 2016-12-02 DIAGNOSIS — E11649 Type 2 diabetes mellitus with hypoglycemia without coma: Secondary | ICD-10-CM | POA: Diagnosis present

## 2016-12-02 DIAGNOSIS — I4892 Unspecified atrial flutter: Secondary | ICD-10-CM | POA: Diagnosis present

## 2016-12-02 DIAGNOSIS — I4891 Unspecified atrial fibrillation: Secondary | ICD-10-CM

## 2016-12-02 DIAGNOSIS — I130 Hypertensive heart and chronic kidney disease with heart failure and stage 1 through stage 4 chronic kidney disease, or unspecified chronic kidney disease: Principal | ICD-10-CM | POA: Diagnosis present

## 2016-12-02 DIAGNOSIS — I5033 Acute on chronic diastolic (congestive) heart failure: Secondary | ICD-10-CM | POA: Diagnosis not present

## 2016-12-02 DIAGNOSIS — Z794 Long term (current) use of insulin: Secondary | ICD-10-CM | POA: Diagnosis not present

## 2016-12-02 DIAGNOSIS — IMO0001 Reserved for inherently not codable concepts without codable children: Secondary | ICD-10-CM

## 2016-12-02 DIAGNOSIS — I482 Chronic atrial fibrillation: Secondary | ICD-10-CM | POA: Diagnosis present

## 2016-12-02 DIAGNOSIS — I480 Paroxysmal atrial fibrillation: Secondary | ICD-10-CM | POA: Diagnosis present

## 2016-12-02 DIAGNOSIS — Z6841 Body Mass Index (BMI) 40.0 and over, adult: Secondary | ICD-10-CM | POA: Diagnosis not present

## 2016-12-02 DIAGNOSIS — N184 Chronic kidney disease, stage 4 (severe): Secondary | ICD-10-CM | POA: Diagnosis present

## 2016-12-02 DIAGNOSIS — Z7952 Long term (current) use of systemic steroids: Secondary | ICD-10-CM | POA: Diagnosis not present

## 2016-12-02 DIAGNOSIS — I509 Heart failure, unspecified: Secondary | ICD-10-CM | POA: Diagnosis not present

## 2016-12-02 DIAGNOSIS — D631 Anemia in chronic kidney disease: Secondary | ICD-10-CM | POA: Diagnosis present

## 2016-12-02 DIAGNOSIS — N179 Acute kidney failure, unspecified: Secondary | ICD-10-CM | POA: Diagnosis present

## 2016-12-02 DIAGNOSIS — E1129 Type 2 diabetes mellitus with other diabetic kidney complication: Secondary | ICD-10-CM | POA: Diagnosis not present

## 2016-12-02 DIAGNOSIS — A419 Sepsis, unspecified organism: Secondary | ICD-10-CM | POA: Insufficient documentation

## 2016-12-02 DIAGNOSIS — I639 Cerebral infarction, unspecified: Secondary | ICD-10-CM

## 2016-12-02 DIAGNOSIS — E11622 Type 2 diabetes mellitus with other skin ulcer: Secondary | ICD-10-CM | POA: Diagnosis present

## 2016-12-02 DIAGNOSIS — I5041 Acute combined systolic (congestive) and diastolic (congestive) heart failure: Secondary | ICD-10-CM | POA: Diagnosis not present

## 2016-12-02 LAB — CBC WITH DIFFERENTIAL/PLATELET
BASOS PCT: 0 %
BLASTS: 0 %
Band Neutrophils: 0 %
Basophils Absolute: 0 10*3/uL (ref 0.0–0.1)
Eosinophils Absolute: 0 10*3/uL (ref 0.0–0.7)
Eosinophils Relative: 0 %
HCT: 39.3 % (ref 39.0–52.0)
HEMOGLOBIN: 13 g/dL (ref 13.0–17.0)
LYMPHS ABS: 2.2 10*3/uL (ref 0.7–4.0)
LYMPHS PCT: 12 %
MCH: 26.5 pg (ref 26.0–34.0)
MCHC: 33.1 g/dL (ref 30.0–36.0)
MCV: 80.2 fL (ref 78.0–100.0)
MONO ABS: 1.1 10*3/uL — AB (ref 0.1–1.0)
Metamyelocytes Relative: 0 %
Monocytes Relative: 6 %
Myelocytes: 0 %
NEUTROS PCT: 82 %
NRBC: 0 /100{WBCs}
Neutro Abs: 14.7 10*3/uL — ABNORMAL HIGH (ref 1.7–7.7)
OTHER: 0 %
PROMYELOCYTES ABS: 0 %
Platelets: 374 10*3/uL (ref 150–400)
RBC: 4.9 MIL/uL (ref 4.22–5.81)
RDW: 16.3 % — ABNORMAL HIGH (ref 11.5–15.5)
WBC: 18 10*3/uL — AB (ref 4.0–10.5)

## 2016-12-02 LAB — COMPREHENSIVE METABOLIC PANEL
ALK PHOS: 71 U/L (ref 38–126)
ALT: 33 U/L (ref 17–63)
ANION GAP: 15 (ref 5–15)
AST: 23 U/L (ref 15–41)
Albumin: 3.1 g/dL — ABNORMAL LOW (ref 3.5–5.0)
BUN: 115 mg/dL — ABNORMAL HIGH (ref 6–20)
CALCIUM: 8.8 mg/dL — AB (ref 8.9–10.3)
CO2: 29 mmol/L (ref 22–32)
Chloride: 87 mmol/L — ABNORMAL LOW (ref 101–111)
Creatinine, Ser: 3.12 mg/dL — ABNORMAL HIGH (ref 0.61–1.24)
GFR, EST AFRICAN AMERICAN: 26 mL/min — AB (ref >60)
GFR, EST NON AFRICAN AMERICAN: 23 mL/min — AB (ref >60)
Glucose, Bld: 233 mg/dL — ABNORMAL HIGH (ref 65–99)
Potassium: 3.6 mmol/L (ref 3.5–5.1)
SODIUM: 131 mmol/L — AB (ref 135–145)
TOTAL PROTEIN: 6.3 g/dL — AB (ref 6.5–8.1)
Total Bilirubin: 1.1 mg/dL (ref 0.3–1.2)

## 2016-12-02 LAB — URINALYSIS, ROUTINE W REFLEX MICROSCOPIC
BACTERIA UA: NONE SEEN
BILIRUBIN URINE: NEGATIVE
GLUCOSE, UA: NEGATIVE mg/dL
HGB URINE DIPSTICK: NEGATIVE
KETONES UR: NEGATIVE mg/dL
LEUKOCYTES UA: NEGATIVE
Nitrite: NEGATIVE
Protein, ur: 30 mg/dL — AB
Specific Gravity, Urine: 1.008 (ref 1.005–1.030)
pH: 5 (ref 5.0–8.0)

## 2016-12-02 LAB — CBG MONITORING, ED: Glucose-Capillary: 273 mg/dL — ABNORMAL HIGH (ref 65–99)

## 2016-12-02 LAB — I-STAT TROPONIN, ED: TROPONIN I, POC: 0.26 ng/mL — AB (ref 0.00–0.08)

## 2016-12-02 LAB — I-STAT CG4 LACTIC ACID, ED: LACTIC ACID, VENOUS: 2.12 mmol/L — AB (ref 0.5–1.9)

## 2016-12-02 LAB — MAGNESIUM: Magnesium: 1.7 mg/dL (ref 1.7–2.4)

## 2016-12-02 LAB — BRAIN NATRIURETIC PEPTIDE: B NATRIURETIC PEPTIDE 5: 554.8 pg/mL — AB (ref 0.0–100.0)

## 2016-12-02 LAB — MRSA PCR SCREENING: MRSA BY PCR: NEGATIVE

## 2016-12-02 LAB — LACTIC ACID, PLASMA: LACTIC ACID, VENOUS: 2.7 mmol/L — AB (ref 0.5–1.9)

## 2016-12-02 MED ORDER — SODIUM CHLORIDE 0.9% FLUSH: 3.0000 mL | INTRAVENOUS | Status: DC | PRN

## 2016-12-02 MED ORDER — SODIUM CHLORIDE 0.9 % IV BOLUS (SEPSIS): 500.0000 mL | Freq: Once | INTRAVENOUS | Status: DC

## 2016-12-02 MED ORDER — ISOSORBIDE DINITRATE 10 MG PO TABS
10.0000 mg | ORAL_TABLET | Freq: Two times a day (BID) | ORAL | Status: DC
  Administered 2016-12-02 – 2016-12-03 (×2): 10 mg via ORAL
  Filled 2016-12-02 (×2): qty 1

## 2016-12-02 MED ORDER — FUROSEMIDE 10 MG/ML IJ SOLN
10.0000 mg/h | INTRAVENOUS | Status: DC
  Administered 2016-12-02: 10 mg/h via INTRAVENOUS
  Filled 2016-12-02: qty 25

## 2016-12-02 MED ORDER — ONDANSETRON HCL 4 MG PO TABS
4.0000 mg | ORAL_TABLET | Freq: Four times a day (QID) | ORAL | Status: DC | PRN
  Administered 2016-12-10: 4 mg via ORAL
  Filled 2016-12-02: qty 1

## 2016-12-02 MED ORDER — ONDANSETRON HCL 4 MG/2ML IJ SOLN
4.0000 mg | Freq: Four times a day (QID) | INTRAMUSCULAR | Status: DC | PRN
Start: 2016-12-02 — End: 2016-12-16
  Administered 2016-12-02: 4 mg via INTRAVENOUS
  Filled 2016-12-02: qty 2

## 2016-12-02 MED ORDER — METOLAZONE 5 MG PO TABS: 5.0000 mg | ORAL_TABLET | ORAL | Status: DC

## 2016-12-02 MED ORDER — RIVAROXABAN 20 MG PO TABS
20.0000 mg | ORAL_TABLET | Freq: Every day | ORAL | Status: DC
  Administered 2016-12-02 – 2016-12-08 (×7): 20 mg via ORAL
  Filled 2016-12-02 (×7): qty 1

## 2016-12-02 MED ORDER — SODIUM CHLORIDE 0.9% FLUSH
3.0000 mL | Freq: Two times a day (BID) | INTRAVENOUS | Status: DC
  Administered 2016-12-03 – 2016-12-16 (×23): 3 mL via INTRAVENOUS

## 2016-12-02 MED ORDER — PIPERACILLIN-TAZOBACTAM 3.375 G IVPB
3.3750 g | Freq: Three times a day (TID) | INTRAVENOUS | Status: DC
  Administered 2016-12-02 – 2016-12-03 (×2): 3.375 g via INTRAVENOUS
  Filled 2016-12-02 (×2): qty 50

## 2016-12-02 MED ORDER — ALBUTEROL SULFATE (2.5 MG/3ML) 0.083% IN NEBU: 3.0000 mL | INHALATION_SOLUTION | Freq: Four times a day (QID) | RESPIRATORY_TRACT | Status: DC | PRN

## 2016-12-02 MED ORDER — CLINDAMYCIN PHOSPHATE 900 MG/50ML IV SOLN
900.0000 mg | Freq: Three times a day (TID) | INTRAVENOUS | Status: DC
  Administered 2016-12-02 – 2016-12-03 (×2): 900 mg via INTRAVENOUS
  Filled 2016-12-02 (×2): qty 50

## 2016-12-02 MED ORDER — CARVEDILOL 12.5 MG PO TABS
25.0000 mg | ORAL_TABLET | Freq: Two times a day (BID) | ORAL | Status: DC
  Administered 2016-12-02 – 2016-12-03 (×2): 25 mg via ORAL
  Filled 2016-12-02 (×2): qty 2

## 2016-12-02 MED ORDER — ACETAMINOPHEN 650 MG RE SUPP
650.0000 mg | Freq: Four times a day (QID) | RECTAL | Status: DC | PRN
  Administered 2016-12-10 (×2): 650 mg via RECTAL

## 2016-12-02 MED ORDER — DILTIAZEM HCL-DEXTROSE 100-5 MG/100ML-% IV SOLN (PREMIX)
5.0000 mg/h | INTRAVENOUS | Status: DC
  Administered 2016-12-02: 15 mg/h via INTRAVENOUS
  Administered 2016-12-03: 5 mg/h via INTRAVENOUS
  Administered 2016-12-03: 15 mg/h via INTRAVENOUS
  Administered 2016-12-04: 10 mg/h via INTRAVENOUS
  Filled 2016-12-02 (×4): qty 100

## 2016-12-02 MED ORDER — SODIUM CHLORIDE 0.9 % IV SOLN: 250.0000 mL | INTRAVENOUS | Status: DC | PRN

## 2016-12-02 MED ORDER — ALBUTEROL SULFATE (2.5 MG/3ML) 0.083% IN NEBU
5.0000 mg | INHALATION_SOLUTION | Freq: Once | RESPIRATORY_TRACT | Status: AC
  Administered 2016-12-02: 5 mg via RESPIRATORY_TRACT
  Filled 2016-12-02: qty 6

## 2016-12-02 MED ORDER — METOPROLOL TARTRATE 5 MG/5ML IV SOLN
5.0000 mg | Freq: Once | INTRAVENOUS | Status: AC
  Administered 2016-12-02: 5 mg via INTRAVENOUS
  Filled 2016-12-02: qty 5

## 2016-12-02 MED ORDER — INSULIN ASPART 100 UNIT/ML CARTRIDGE (PENFILL)
5.0000 [IU] | Freq: Three times a day (TID) | SUBCUTANEOUS | Status: DC
  Filled 2016-12-02: qty 3

## 2016-12-02 MED ORDER — FUROSEMIDE 10 MG/ML IJ SOLN
80.0000 mg | Freq: Once | INTRAMUSCULAR | Status: AC
  Administered 2016-12-02: 80 mg via INTRAVENOUS
  Filled 2016-12-02: qty 8

## 2016-12-02 MED ORDER — INSULIN GLARGINE 100 UNIT/ML ~~LOC~~ SOLN
10.0000 [IU] | Freq: Every day | SUBCUTANEOUS | Status: DC
  Administered 2016-12-02 – 2016-12-03 (×2): 10 [IU] via SUBCUTANEOUS
  Filled 2016-12-02 (×3): qty 0.1

## 2016-12-02 MED ORDER — DILTIAZEM HCL-DEXTROSE 100-5 MG/100ML-% IV SOLN (PREMIX)
5.0000 mg/h | Freq: Once | INTRAVENOUS | Status: AC
  Administered 2016-12-02: 5 mg/h via INTRAVENOUS
  Filled 2016-12-02: qty 100

## 2016-12-02 MED ORDER — PIPERACILLIN-TAZOBACTAM 3.375 G IVPB 30 MIN
3.3750 g | Freq: Once | INTRAVENOUS | Status: AC
  Administered 2016-12-02: 3.375 g via INTRAVENOUS
  Filled 2016-12-02: qty 50

## 2016-12-02 MED ORDER — INSULIN ASPART 100 UNIT/ML ~~LOC~~ SOLN
5.0000 [IU] | Freq: Three times a day (TID) | SUBCUTANEOUS | Status: DC
  Administered 2016-12-03 – 2016-12-06 (×10): 5 [IU] via SUBCUTANEOUS

## 2016-12-02 MED ORDER — OXYCODONE HCL 5 MG PO TABS
5.0000 mg | ORAL_TABLET | ORAL | Status: DC | PRN
  Administered 2016-12-02 – 2016-12-15 (×2): 5 mg via ORAL
  Filled 2016-12-02 (×4): qty 1

## 2016-12-02 MED ORDER — ACETAMINOPHEN 325 MG PO TABS
650.0000 mg | ORAL_TABLET | Freq: Four times a day (QID) | ORAL | Status: DC | PRN
  Administered 2016-12-03 – 2016-12-16 (×10): 650 mg via ORAL
  Filled 2016-12-02 (×14): qty 2

## 2016-12-02 MED ORDER — COLCHICINE 0.6 MG PO TABS
0.6000 mg | ORAL_TABLET | Freq: Two times a day (BID) | ORAL | Status: DC
  Administered 2016-12-02 – 2016-12-10 (×16): 0.6 mg via ORAL
  Filled 2016-12-02 (×16): qty 1

## 2016-12-02 MED ORDER — LEVALBUTEROL HCL 0.63 MG/3ML IN NEBU
0.6300 mg | INHALATION_SOLUTION | Freq: Four times a day (QID) | RESPIRATORY_TRACT | Status: DC | PRN
Start: 2016-12-02 — End: 2016-12-16

## 2016-12-02 MED ORDER — SODIUM CHLORIDE 0.9 % IV BOLUS (SEPSIS): 1000.0000 mL | Freq: Once | INTRAVENOUS | Status: DC

## 2016-12-02 MED ORDER — VANCOMYCIN HCL IN DEXTROSE 1-5 GM/200ML-% IV SOLN
1000.0000 mg | Freq: Once | INTRAVENOUS | Status: AC
  Administered 2016-12-02: 1000 mg via INTRAVENOUS
  Filled 2016-12-02: qty 200

## 2016-12-02 MED ORDER — POTASSIUM CHLORIDE CRYS ER 20 MEQ PO TBCR
20.0000 meq | EXTENDED_RELEASE_TABLET | Freq: Every day | ORAL | Status: DC
  Administered 2016-12-03: 20 meq via ORAL
  Filled 2016-12-02: qty 1

## 2016-12-02 MED ORDER — GABAPENTIN 300 MG PO CAPS
300.0000 mg | ORAL_CAPSULE | Freq: Three times a day (TID) | ORAL | Status: DC
  Administered 2016-12-02 – 2016-12-16 (×41): 300 mg via ORAL
  Filled 2016-12-02 (×41): qty 1

## 2016-12-02 MED ORDER — HYDRALAZINE HCL 50 MG PO TABS
50.0000 mg | ORAL_TABLET | Freq: Two times a day (BID) | ORAL | Status: DC
  Administered 2016-12-02: 50 mg via ORAL
  Filled 2016-12-02 (×2): qty 1

## 2016-12-02 NOTE — H&P (Signed)
[facility] History and Physical  John Bailey WLS:[id number] DOB: 1971/02/08 DOA: 12/02/2016  Referring physician: *  PCP: [name], MD   Chief Complaint:    HPI:   46 year old male with a history of diabetes, hypertension, atrial fibrillation, on Xarelto, followed by Dr. Debara Pickett, who presents to the ED today because of worsening dyspnea on exertion, weeping bilateral lower extremity edema, erythema and duration of the left lower extremity, and unquantifiable amount of weight gain, orthopnea. Patient denied chest pain per se Patient noticed worsening erythema and redness of his left lower extremity 2 days ago ED course BP (!) 168/116   Pulse (!) 169   Resp (!) 24   Ht 5\' 11"  (1.803 m)   Wt (!) 140.6 kg (310 lb)   SpO2 97%   BMI 43.24 kg/m  Patient found to be in atrial fibrillation with rapid ventricular response with a heart rate in the 180s Acute on chronic kidney injury, CHF exacerbation with pitting edema up to his thighs Admitted to step down on Cardizem drip, Lasix drip    Review of Systems: negative for the following   HEENT: Denies photophobia, eye pain, redness, hearing loss, ear pain, congestion, sore throat, rhinorrhea, sneezing, mouth sores, trouble swallowing, neck pain, neck stiffness and tinnitus.  Constitutional: Positive for chills, fatigue and fever.  Eyes: Negative for visual disturbance.  Respiratory: Positive for shortness of breath. Negative for cough, chest tightness and wheezing.   Cardiovascular: Positive for chest pain and leg swelling. Negative for palpitations.  Gastrointestinal: Negative for abdominal pain, constipation, diarrhea, nausea and vomiting.  Musculoskeletal: Positive for myalgias. Negative for back pain, neck pain and neck stiffness.  Skin: Positive for color change. Negative for rashNeurological: Denies dizziness, seizures, syncope, weakness, light-headedness, numbness and headaches.  Hematological: Denies adenopathy. Easy  bruising, personal or family bleeding history  Psychiatric/Behavioral: Denies suicidal ideation, mood changes, confusion, nervousness, sleep disturbance and agitation       Past Medical History:  Diagnosis Date  . Atrial flutter with rapid ventricular response ([place]) 08/06/2015  . Diabetes ([place])   . Hypertension      Past Surgical History:  Procedure Laterality Date  . CARDIOVERSION N/A 08/01/2015   Procedure: CARDIOVERSION;  Surgeon: Josue Hector, MD;  Location: [facility] ENDOSCOPY;  Service: Cardiovascular;  Laterality: N/A;  . CHOLECYSTECTOMY    . TEE WITHOUT CARDIOVERSION N/A 08/01/2015   Procedure: TRANSESOPHAGEAL ECHOCARDIOGRAM (TEE);  Surgeon: Josue Hector, MD;  Location: [facility] ENDOSCOPY;  Service: Cardiovascular;  Laterality: N/A;      Social History:  reports that he has never smoked. He has never used smokeless tobacco. He reports that he does not drink alcohol or use drugs.    No Known Allergies  Family History  Problem Relation Age of Onset  . Heart disease Mother   . Hyperlipidemia Father   . Hypertension Father          Prior to Admission medications   Medication Sig Start Date End Date Taking? Authorizing Provider  albuterol (PROVENTIL HFA;VENTOLIN HFA) 108 (90 BASE) MCG/ACT inhaler Inhale 1-2 puffs into the lungs every 6 (six) hours as needed for wheezing. 05/30/14   Nahser, Wonda Cheng, MD  carvedilol (COREG) 25 MG tablet Take 1 tablet (25 mg total) by mouth 2 (two) times daily with a meal. 08/19/15   Hilty, Nadean Corwin, MD  chlorpheniramine-HYDROcodone (TUSSIONEX PENNKINETIC ER) 10-8 MG/5ML SUER Take 5 mLs by mouth 2 (two) times daily as needed for cough.    [provider]  colchicine 0.6 MG tablet Take 1 tablet (0.6 mg total) by mouth 2 (two) times daily. 03/18/16   Tanna Furry, MD  docusate sodium (COLACE) 100 MG capsule Take 1 capsule (100 mg total) by mouth every 12 (twelve) hours. 11/22/16   Orpah Greek, MD  fluticasone (FLONASE) 50 MCG/ACT nasal  spray Place 2 sprays into the nose daily. 12/29/12   Janne Napoleon, NP  furosemide (LASIX) 80 MG tablet Take 2 tablets (160 mg total) by mouth 2 (two) times daily. 08/19/15   Hilty, Nadean Corwin, MD  gabapentin (NEURONTIN) 300 MG capsule Take 1 capsule by mouth 3 (three) times daily. 09/09/15   [provider]  hydrALAZINE (APRESOLINE) 50 MG tablet take 1 tablet by mouth twice a day 10/09/16   Pixie Casino, MD  HYDROcodone-acetaminophen (NORCO/VICODIN) 5-325 MG tablet Take 1-2 tablets by mouth every 6 (six) hours as needed for severe pain. 09/05/15   Blanchie Dessert, MD  Ibuprofen-Famotidine (DUEXIS) 800-26.6 MG TABS Take 1 tablet by mouth 2 (two) times daily. 06/23/16   Meredith Pel, MD  insulin aspart (NOVOLOG PENFILL) cartridge Inject 5 Units into the skin 3 (three) times daily with meals. 08/08/15   Charlynne Cousins, MD  Insulin Glargine (LANTUS) 100 UNIT/ML Solostar Pen Inject 10 Units into the skin daily at 10 pm. 08/08/15   Charlynne Cousins, MD  Insulin Pen Needle 31G X 6 MM MISC 1 Device by Does not apply route 2 (two) times daily. 08/08/15   Charlynne Cousins, MD  isosorbide dinitrate (ISORDIL) 10 MG tablet take 1 tablet by mouth twice a day 10/09/16   Pixie Casino, MD  lactulose (CHRONULAC) 10 GM/15ML solution Take 15 mLs (10 g total) by mouth 2 (two) times daily as needed for moderate constipation. 11/22/16   Orpah Greek, MD  methylPREDNISolone (MEDROL DOSEPAK) 4 MG TBPK tablet 6 po on day 1, then decrease by 1 per day 03/18/16   Tanna Furry, MD  metolazone (ZAROXOLYN) 5 MG tablet Take 5 mg by mouth every other day.    [provider]  Multiple Vitamin (MULTIVITAMIN WITH MINERALS) TABS tablet Take 2 tablets by mouth daily.    [provider]  oxyCODONE-acetaminophen (PERCOCET/ROXICET) 5-325 MG tablet Take 2 tablets by mouth every 4 (four) hours as needed. 03/18/16   Tanna Furry, MD  potassium chloride SA (KLOR-CON M20) 20 MEQ tablet Take 1 tablet  (20 mEq total) by mouth daily. 08/19/15   Hilty, Nadean Corwin, MD  rivaroxaban (XARELTO) 20 MG TABS tablet take 1 tablet by mouth once daily WITH SUPPER 10/12/16   Pixie Casino, MD     Physical Exam: Vitals:   12/02/16 1411 12/02/16 1412 12/02/16 1449 12/02/16 1500  BP: (!) 144/119 (!) 144/119 (!) 141/129 (!) 168/116  Pulse:  (!) 191  (!) 169  Resp: (!) 31 18 (!) 29 (!) 24  SpO2:  97% 97% 97%  Weight:      Height:            Vitals:   12/02/16 1411 12/02/16 1412 12/02/16 1449 12/02/16 1500  BP: (!) 144/119 (!) 144/119 (!) 141/129 (!) 168/116  Pulse:  (!) 191  (!) 169  Resp: (!) 31 18 (!) 29 (!) 24  SpO2:  97% 97% 97%  Weight:      Height:       Constitutional: NAD, calm, comfortable Eyes: PERRL, lids and conjunctivae normal ENMT: Mucous membranes are moist. Posterior pharynx clear of any exudate  or lesions.Normal dentition.  Neck: normal, supple, no masses, no thyromegaly Respiratory: clear to auscultation bilaterally, no wheezing, no crackles. Normal respiratory effort. No accessory muscle use.  Cardiovascular: Regular rate and rhythm, no murmurs / rubs / gallops. No extremity edema. 2+ pedal pulses. No carotid bruits.  Abdomen: no tenderness, no masses palpated. No hepatosplenomegaly. Bowel sounds positive.  Musculoskeletal: Normal range of motion. He exhibits edema and tenderness.  Bilateral lower extremity edema. 2+ in the right lower extremity and 3+ in the left lower extremity. Patient has several weeping wounds to the pretibial space of the left lower extremity. There is erythema extending up proximal to the knee. Tenderness to palpation and warmth. No tracking or inguinal lymphadenopathy.  Neurological: He is alert and oriented to person, place, and time.  Moving all extremities without deficit. Sensation fully intact.  Psychiatric: Normal judgment and insight. Alert and oriented x 3. Normal mood.     Labs on Admission: I have personally reviewed following labs and  imaging studies  CBC:  Recent Labs Lab 12/02/16 1405  WBC 18.0*  NEUTROABS 14.7*  HGB 13.0  HCT 39.3  MCV 80.2  PLT 774    Basic Metabolic Panel:  Recent Labs Lab 12/02/16 1405  NA 131*  K 3.6  CL 87*  CO2 29  GLUCOSE 233*  BUN 115*  CREATININE 3.12*  CALCIUM 8.8*  MG 1.7    GFR: Estimated Creatinine Clearance: 43.3 mL/min (A) (by C-G formula based on SCr of 3.12 mg/dL (H)).  Liver Function Tests:  Recent Labs Lab 12/02/16 1405  AST 23  ALT 33  ALKPHOS 71  BILITOT 1.1  PROT 6.3*  ALBUMIN 3.1*   No results for input(s): LIPASE, AMYLASE in the last 168 hours. No results for input(s): AMMONIA in the last 168 hours.  Coagulation Profile: No results for input(s): INR, PROTIME in the last 168 hours. No results for input(s): DDIMER in the last 72 hours.  Cardiac Enzymes: No results for input(s): CKTOTAL, CKMB, CKMBINDEX, TROPONINI in the last 168 hours.  BNP (last 3 results) No results for input(s): PROBNP in the last 8760 hours.  HbA1C: No results for input(s): HGBA1C in the last 72 hours. Lab Results  Component Value Date   HGBA1C 9.7 (H) 07/31/2015     CBG: No results for input(s): GLUCAP in the last 168 hours.  Lipid Profile: No results for input(s): CHOL, HDL, LDLCALC, TRIG, CHOLHDL, LDLDIRECT in the last 72 hours.  Thyroid Function Tests: No results for input(s): TSH, T4TOTAL, FREET4, T3FREE, THYROIDAB in the last 72 hours.  Anemia Panel: No results for input(s): VITAMINB12, FOLATE, FERRITIN, TIBC, IRON, RETICCTPCT in the last 72 hours.  Urine analysis:    Component Value Date/Time   COLORURINE YELLOW 12/02/2016 1518   APPEARANCEUR CLEAR 12/02/2016 1518   LABSPEC 1.008 12/02/2016 1518   PHURINE 5.0 12/02/2016 1518   GLUCOSEU NEGATIVE 12/02/2016 1518   HGBUR NEGATIVE 12/02/2016 1518   BILIRUBINUR NEGATIVE 12/02/2016 1518   KETONESUR NEGATIVE 12/02/2016 1518   PROTEINUR 30 (A) 12/02/2016 1518   NITRITE NEGATIVE 12/02/2016 1518    LEUKOCYTESUR NEGATIVE 12/02/2016 1518    Sepsis Labs: @LABRCNTIP (procalcitonin:4,lacticidven:4) )No results found for this or any previous visit (from the past 240 hour(s)).       Radiological Exams on Admission: No results found. Dg Abd Acute W/chest  Result Date: 11/22/2016 CLINICAL DATA:  Constipation and rectal pain. Last bowel movement 14 days ago. History of diabetes and hypertension. EXAM: DG ABDOMEN ACUTE W/ 1V CHEST  COMPARISON:  Chest 01/28/2016 FINDINGS: Shallow inspiration. Cardiac enlargement. Pulmonary vascular congestion with perihilar interstitial changes likely representing edema. No focal consolidation. No blunting of costophrenic angles. No pneumothorax. Scattered gas and stool throughout the colon. No small or large bowel distention. No free intra-abdominal air. No abnormal air-fluid levels. No radiopaque stones. Visualized bones appear intact. Surgical clips in the right upper quadrant. IMPRESSION: Congestive changes in the heart and lungs with cardiac enlargement, pulmonary vascular congestion, and perihilar edema. Nonobstructive bowel gas pattern. Electronically Signed   By: Lucienne Capers M.D.   On: 11/22/2016 04:15      EKG: Independently reviewed. Extreme tachycardia with wide complex, no further rhythm analysis attempted Baseline wander in lead(s) I II aVR   Assessment/Plan Atrial fibrillation with rapid ventricular response Likely the setting of sepsis Started on Cardizem drip for rate control On Xarelto at home for A. fib Continue Coreg  Presumed sepsis secondary to left lower extremity cellulitis Started on broad-spectrum antibiotics Blood pressure stable Follow blood culture results    Acute on chronic kidney injury Baseline creatinine around 2.5, it is 3.12 Patient markedly volume overloaded Recent 2-D echo was in April that showed EF of 50-55% Will start patient on Lasix drip for brisk diuresis Strict I's and O's Daily weights Monitor renal  function closely   Diabetes We'll start patient on Lantus and sliding scale insulin Check hemoglobin A1c      DVT prophylaxis:  Xarelto     Code Status Orders full code         consults called:  Family Communication: Admission, patients condition and plan of care including tests being ordered have been discussed with the patient  who indicates understanding and agree with the plan and Code Status  Admission status: inpatient    Disposition plan: Further plan will depend as patient's clinical course evolves and further radiologic and laboratory data become available. Likely home when stable   At the time of admission, it appears that the appropriate admission status for this patient is INPATIENT .Thisis judged to be reasonable and necessary in order to provide the required intensity of service to ensure the patient's safetygiven thepresenting symptoms, physical exam findings, and initial radiographic and laboratory data in the context of their chronic comorbidities.   Reyne Dumas MD [facility] Pager [phone]  If 7PM-7AM, please contact night-coverage [email or web address] Password [email or web address]  12/02/2016, 4:03 PM

## 2016-12-02 NOTE — ED Notes (Signed)
Spoke with lab, will add on HgA1c

## 2016-12-02 NOTE — Progress Notes (Signed)
Pharmacy Antibiotic Note  John Bailey is a 46 y.o. male admitted on 12/02/2016 withworsening dyspnea on exertion, weeping bilateral lower extremity edema, erythema and duration of the left lower extremity, and unquantifiable amount of weight gain, orthopnea .  Pharmacy has been consulted for vancomycin and clindamycin dosing for LE cellulitis.  Dr. Allyson Sabal did not want to continue the vancomycin given in the ED due to his renal function.  Wt 140 kg. WBC 18. Creat 3.12. Lactate 2.15.  Plan: Zosyn 3.375g IV q8h (4 hour infusion). Clinda 900 mg IV q8h  Height: 5\' 11"  (180.3 cm) Weight: (!) 310 lb (140.6 kg) IBW/kg (Calculated) : 75.3  No data recorded.   Recent Labs Lab 12/02/16 1405 12/02/16 1419  WBC 18.0*  --   CREATININE 3.12*  --   LATICACIDVEN  --  2.12*    Estimated Creatinine Clearance: 43.3 mL/min (A) (by C-G formula based on SCr of 3.12 mg/dL (H)).    No Known Allergies  Antimicrobials this admission: 9/19 vanc x 1  9/19 zosyn>> 9/19 clinda>>  Dose adjustments this admission:  Microbiology results: 9/19 HIV 9/19 BCx2>>  Thank you for allowing pharmacy to be a part of this patient's care. Eudelia Bunch, Pharm.D. [phone] 12/02/2016 5:19 PM
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
## 2016-12-02 NOTE — ED Notes (Signed)
Patient troponin elevated 0.26. EDP Lita Mains and RN [facility] notified

## 2016-12-02 NOTE — ED Notes (Addendum)
Patient lactic acid 2.12, EDP [place] and [employer] notified

## 2016-12-02 NOTE — ED Provider Notes (Signed)
[facility] DEPT Provider Note   CSN: [id number] Arrival date & time: 12/02/16  1321     History   Chief Complaint Chief Complaint  Patient presents with  . Shortness of Breath    HPI TAYVIAN HOLYCROSS is a 46 y.o. male.  HPI Patient presents a referral from his primary physician for increased shortness of breath since admission last few days. Worse with lying flat and any exertion. He's also had bilateral lower extremity swelling but worse on the left. Associated with pain in the left leg. He has some blistering in the pretibial area. Admits to subjective fever and chills. Denies cough, chest pain, lightheadedness. Denies palpitations. Past Medical History:  Diagnosis Date  . Atrial flutter with rapid ventricular response ([place]) 08/06/2015  . Diabetes ([place])   . Hypertension     Patient Active Problem List   Diagnosis Date Noted  . Chronic diastolic CHF (congestive heart failure) ([place]) 03/20/2016  . Nonischemic cardiomyopathy ([place]) 11/11/2015  . Long term (current) use of anticoagulants [Z79.01] 08/14/2015  . Insulin-dependent diabetes mellitus with renal complications ([place]) 70/17/7939  . Atrial flutter with rapid ventricular response ([place]) 08/06/2015  . Hyponatremia 07/30/2015  . AKI (acute kidney injury) ([place]) 07/30/2015  . Hyperkalemia 07/30/2015  . Acute combined systolic (congestive) and diastolic (congestive) heart failure ([place]) 07/30/2015  . Morbid obesity ([place]) 07/30/2015  . Paroxysmal atrial fibrillation ([place]) 07/30/2015  . 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 Hypertensive heart disease with heart failure ([place]) 07/14/2012  . Dyspnea 07/14/2012  . Hyperlipidemia 07/14/2012    Past Surgical History:  Procedure Laterality Date  . CARDIOVERSION N/A 08/01/2015   Procedure: CARDIOVERSION;  Surgeon: Josue Hector, MD;  Location: [facility] ENDOSCOPY;  Service: Cardiovascular;  Laterality: N/A;  . CHOLECYSTECTOMY    . TEE WITHOUT  CARDIOVERSION N/A 08/01/2015   Procedure: TRANSESOPHAGEAL ECHOCARDIOGRAM (TEE);  Surgeon: Josue Hector, MD;  Location: [facility] ENDOSCOPY;  Service: Cardiovascular;  Laterality: N/A;       Home Medications    Prior to Admission medications   Medication Sig Start Date End Date Taking? Authorizing Provider  albuterol (PROVENTIL HFA;VENTOLIN HFA) 108 (90 BASE) MCG/ACT inhaler Inhale 1-2 puffs into the lungs every 6 (six) hours as needed for wheezing. 05/30/14   Nahser, Wonda Cheng, MD  carvedilol (COREG) 25 MG tablet Take 1 tablet (25 mg total) by mouth 2 (two) times daily with a meal. 08/19/15   Hilty, Nadean Corwin, MD  chlorpheniramine-HYDROcodone (TUSSIONEX PENNKINETIC ER) 10-8 MG/5ML SUER Take 5 mLs by mouth 2 (two) times daily as needed for cough.    [provider]  colchicine 0.6 MG tablet Take 1 tablet (0.6 mg total) by mouth 2 (two) times daily. 03/18/16   Tanna Furry, MD  docusate sodium (COLACE) 100 MG capsule Take 1 capsule (100 mg total) by mouth every 12 (twelve) hours. 11/22/16   Orpah Greek, MD  fluticasone (FLONASE) 50 MCG/ACT nasal spray Place 2 sprays into the nose daily. 12/29/12   Janne Napoleon, NP  furosemide (LASIX) 80 MG tablet Take 2 tablets (160 mg total) by mouth 2 (two) times daily. 08/19/15   Hilty, Nadean Corwin, MD  gabapentin (NEURONTIN) 300 MG capsule Take 1 capsule by mouth 3 (three) times daily. 09/09/15   [provider]  hydrALAZINE (APRESOLINE) 50 MG tablet take 1 tablet by mouth twice a day 10/09/16   Pixie Casino, MD  HYDROcodone-acetaminophen (NORCO/VICODIN) 5-325 MG tablet Take 1-2 tablets by mouth every 6 (six)  hours as needed for severe pain. 09/05/15   Blanchie Dessert, MD  Ibuprofen-Famotidine (DUEXIS) 800-26.6 MG TABS Take 1 tablet by mouth 2 (two) times daily. 06/23/16   Meredith Pel, MD  insulin aspart (NOVOLOG PENFILL) cartridge Inject 5 Units into the skin 3 (three) times daily with meals. 08/08/15   Charlynne Cousins, MD  Insulin  Glargine (LANTUS) 100 UNIT/ML Solostar Pen Inject 10 Units into the skin daily at 10 pm. 08/08/15   Charlynne Cousins, MD  Insulin Pen Needle 31G X 6 MM MISC 1 Device by Does not apply route 2 (two) times daily. 08/08/15   Charlynne Cousins, MD  isosorbide dinitrate (ISORDIL) 10 MG tablet take 1 tablet by mouth twice a day 10/09/16   Pixie Casino, MD  lactulose (CHRONULAC) 10 GM/15ML solution Take 15 mLs (10 g total) by mouth 2 (two) times daily as needed for moderate constipation. 11/22/16   Orpah Greek, MD  methylPREDNISolone (MEDROL DOSEPAK) 4 MG TBPK tablet 6 po on day 1, then decrease by 1 per day 03/18/16   Tanna Furry, MD  metolazone (ZAROXOLYN) 5 MG tablet Take 5 mg by mouth every other day.    [provider]  Multiple Vitamin (MULTIVITAMIN WITH MINERALS) TABS tablet Take 2 tablets by mouth daily.    [provider]  oxyCODONE-acetaminophen (PERCOCET/ROXICET) 5-325 MG tablet Take 2 tablets by mouth every 4 (four) hours as needed. 03/18/16   Tanna Furry, MD  potassium chloride SA (KLOR-CON M20) 20 MEQ tablet Take 1 tablet (20 mEq total) by mouth daily. 08/19/15   Hilty, Nadean Corwin, MD  rivaroxaban (XARELTO) 20 MG TABS tablet take 1 tablet by mouth once daily WITH SUPPER 10/12/16   Hilty, Nadean Corwin, MD    Family History Family History  Problem Relation Age of Onset  . Heart disease Mother   . Hyperlipidemia Father   . Hypertension Father     Social History Social History  Substance Use Topics  . Smoking status: Never Smoker  . Smokeless tobacco: Never Used  . Alcohol use No     Allergies   Patient has no known allergies.   Review of Systems Review of Systems  Constitutional: Positive for chills, fatigue and fever.  Eyes: Negative for visual disturbance.  Respiratory: Positive for shortness of breath. Negative for cough, chest tightness and wheezing.   Cardiovascular: Positive for chest pain and leg swelling. Negative for palpitations.    Gastrointestinal: Negative for abdominal pain, constipation, diarrhea, nausea and vomiting.  Musculoskeletal: Positive for myalgias. Negative for back pain, neck pain and neck stiffness.  Skin: Positive for color change. Negative for rash.  Neurological: Negative for dizziness, weakness, light-headedness, numbness and headaches.  All other systems reviewed and are negative.    Physical Exam Updated Vital Signs BP (!) 168/116   Pulse (!) 169   Resp (!) 24   Ht 5\' 11"  (1.803 m)   Wt (!) 140.6 kg (310 lb)   SpO2 97%   BMI 43.24 kg/m   Physical Exam  Constitutional: He is oriented to person, place, and time. He appears well-developed and well-nourished. No distress.  HENT:  Head: Normocephalic and atraumatic.  Mouth/Throat: Oropharynx is clear and moist. No oropharyngeal exudate.  Eyes: Pupils are equal, round, and reactive to light. EOM are normal.  Neck: Normal range of motion. Neck supple.  Cardiovascular:  Tachycardia  Pulmonary/Chest: Effort normal.  Diminished breath sounds bilateral bases.  Abdominal: Soft. Bowel sounds are normal. There is no  tenderness. There is no rebound and no guarding.  Musculoskeletal: Normal range of motion. He exhibits edema and tenderness.  Bilateral lower extremity edema. 2+ in the right lower extremity and 3+ in the left lower extremity. Patient has several weeping wounds to the pretibial space of the left lower extremity. There is erythema extending up proximal to the knee. Tenderness to palpation and warmth. No tracking or inguinal lymphadenopathy.  Neurological: He is alert and oriented to person, place, and time.  Moving all extremities without deficit. Sensation fully intact.  Skin: Skin is warm and dry. Capillary refill takes less than 2 seconds. No rash noted. There is erythema.  Psychiatric: He has a normal mood and affect. His behavior is normal.  Nursing note and vitals reviewed.    ED Treatments / Results  Labs (all labs ordered  are listed, but only abnormal results are displayed) Labs Reviewed  CBC WITH DIFFERENTIAL/PLATELET - Abnormal; Notable for the following:       Result Value   WBC 18.0 (*)    RDW 16.3 (*)    All other components within normal limits  COMPREHENSIVE METABOLIC PANEL - Abnormal; Notable for the following:    Sodium 131 (*)    Chloride 87 (*)    Glucose, Bld 233 (*)    BUN 115 (*)    Creatinine, Ser 3.12 (*)    Calcium 8.8 (*)    Total Protein 6.3 (*)    Albumin 3.1 (*)    GFR calc non Af Amer 23 (*)    GFR calc Af Amer 26 (*)    All other components within normal limits  I-STAT CG4 LACTIC ACID, ED - Abnormal; Notable for the following:    Lactic Acid, Venous 2.12 (*)    All other components within normal limits  I-STAT TROPONIN, ED - Abnormal; Notable for the following:    Troponin i, poc 0.26 (*)    All other components within normal limits  CULTURE, BLOOD (ROUTINE X 2)  CULTURE, BLOOD (ROUTINE X 2)  MAGNESIUM  BRAIN NATRIURETIC PEPTIDE  URINALYSIS, ROUTINE W REFLEX MICROSCOPIC    EKG  EKG Interpretation  Date/Time:  Wednesday December 02 2016 13:57:15 EDT Ventricular Rate:  198 PR Interval:    QRS Duration: 165 QT Interval:  253 QTC Calculation: 460 R Axis:   -142 Text Interpretation:  Extreme tachycardia with wide complex, no further rhythm analysis attempted Baseline wander in lead(s) I II aVR Confirmed by Lita Mains  MD, Caria Transue ([id number]) on 12/02/2016 3:18:03 PM Also confirmed by Lita Mains  MD, Iosefa Weintraub ([id number])  on 12/02/2016 3:18:37 PM       Radiology No results found.  Procedures Procedures (including critical care time)  Medications Ordered in ED Medications  vancomycin (VANCOCIN) IVPB 1000 mg/200 mL premix (1,000 mg Intravenous New Bag/Given 12/02/16 1522)  albuterol (PROVENTIL) (2.5 MG/3ML) 0.083% nebulizer solution 5 mg (5 mg Nebulization Given 12/02/16 1430)  diltiazem (CARDIZEM) 100 mg in dextrose 5% 138mL (1 mg/mL) infusion (10 mg/hr Intravenous Rate/Dose  Change 12/02/16 1517)  piperacillin-tazobactam (ZOSYN) IVPB 3.375 g (0 g Intravenous Stopped 12/02/16 1517)   CRITICAL CARE Performed by: Lita Mains, Kaula Klenke Total critical care time: 20 minutes Critical care time was exclusive of separately billable procedures and treating other patients. Critical care was necessary to treat or prevent imminent or life-threatening deterioration. Critical care was time spent personally by me on the following activities: development of treatment plan with patient and/or surrogate as well as nursing, discussions with consultants, evaluation of patient's response  to treatment, examination of patient, obtaining history from patient or surrogate, ordering and performing treatments and interventions, ordering and review of laboratory studies, ordering and review of radiographic studies, pulse oximetry and re-evaluation of patient's condition.  Initial Impression / Assessment and Plan / ED Course  I have reviewed the triage vital signs and the nursing notes.  Pertinent labs & imaging results that were available during my care of the patient were reviewed by me and considered in my medical decision making (see chart for details).    Placed on Cardizem drip. Will cover broadly for cellulitis. Spoke with Wannetta Sender, cardiology. Will consult on patient.  Dr. Allyson Sabal will see patient in emergency department and admit. Final Clinical Impressions(s) / ED Diagnoses   Final diagnoses:  Atrial fibrillation with RVR ([place])  Left leg cellulitis  Acute on chronic renal insufficiency  Acute on chronic congestive heart failure, unspecified heart failure type [facility])    New Prescriptions New Prescriptions   No medications on file     Julianne Rice, MD 12/02/16 1533

## 2016-12-02 NOTE — ED Notes (Signed)
Spoke with Dr. Clemencia Course regarding bolus order, lasix and dilt gtt. Will continue to titrate drip up to max 15 and stay until HR under control with lasix.

## 2016-12-02 NOTE — ED Notes (Signed)
Pt concerned about when he can eat, informed RN chelsea of pts concern

## 2016-12-03 DIAGNOSIS — I5033 Acute on chronic diastolic (congestive) heart failure: Secondary | ICD-10-CM

## 2016-12-03 DIAGNOSIS — I481 Persistent atrial fibrillation: Secondary | ICD-10-CM

## 2016-12-03 DIAGNOSIS — I4892 Unspecified atrial flutter: Secondary | ICD-10-CM

## 2016-12-03 LAB — HIV ANTIBODY (ROUTINE TESTING W REFLEX): HIV Screen 4th Generation wRfx: NONREACTIVE

## 2016-12-03 LAB — COMPREHENSIVE METABOLIC PANEL
ALK PHOS: 59 U/L (ref 38–126)
ALT: 28 U/L (ref 17–63)
AST: 19 U/L (ref 15–41)
Albumin: 2.6 g/dL — ABNORMAL LOW (ref 3.5–5.0)
Anion gap: 16 — ABNORMAL HIGH (ref 5–15)
BUN: 128 mg/dL — AB (ref 6–20)
CALCIUM: 8.3 mg/dL — AB (ref 8.9–10.3)
CHLORIDE: 85 mmol/L — AB (ref 101–111)
CO2: 29 mmol/L (ref 22–32)
CREATININE: 3.25 mg/dL — AB (ref 0.61–1.24)
GFR calc non Af Amer: 22 mL/min — ABNORMAL LOW (ref >60)
GFR, EST AFRICAN AMERICAN: 25 mL/min — AB (ref >60)
Glucose, Bld: 319 mg/dL — ABNORMAL HIGH (ref 65–99)
Potassium: 4.2 mmol/L (ref 3.5–5.1)
Sodium: 130 mmol/L — ABNORMAL LOW (ref 135–145)
Total Bilirubin: 1 mg/dL (ref 0.3–1.2)
Total Protein: 5.7 g/dL — ABNORMAL LOW (ref 6.5–8.1)

## 2016-12-03 LAB — CBC
HCT: 36.8 % — ABNORMAL LOW (ref 39.0–52.0)
Hemoglobin: 11.8 g/dL — ABNORMAL LOW (ref 13.0–17.0)
MCH: 25.6 pg — AB (ref 26.0–34.0)
MCHC: 32.1 g/dL (ref 30.0–36.0)
MCV: 79.8 fL (ref 78.0–100.0)
PLATELETS: 315 10*3/uL (ref 150–400)
RBC: 4.61 MIL/uL (ref 4.22–5.81)
RDW: 16.4 % — AB (ref 11.5–15.5)
WBC: 21.3 10*3/uL — ABNORMAL HIGH (ref 4.0–10.5)

## 2016-12-03 LAB — GLUCOSE, CAPILLARY
GLUCOSE-CAPILLARY: 367 mg/dL — AB (ref 65–99)
GLUCOSE-CAPILLARY: 353 mg/dL — AB (ref 65–99)
Glucose-Capillary: 342 mg/dL — ABNORMAL HIGH (ref 65–99)
Glucose-Capillary: 368 mg/dL — ABNORMAL HIGH (ref 65–99)

## 2016-12-03 MED ORDER — CARVEDILOL 25 MG PO TABS
25.0000 mg | ORAL_TABLET | Freq: Two times a day (BID) | ORAL | Status: DC
  Administered 2016-12-03 – 2016-12-12 (×19): 25 mg via ORAL
  Filled 2016-12-03: qty 2
  Filled 2016-12-03: qty 1
  Filled 2016-12-03 (×2): qty 2
  Filled 2016-12-03: qty 1
  Filled 2016-12-03 (×2): qty 2
  Filled 2016-12-03 (×6): qty 1
  Filled 2016-12-03 (×2): qty 2
  Filled 2016-12-03: qty 1
  Filled 2016-12-03: qty 2
  Filled 2016-12-03 (×3): qty 1

## 2016-12-03 MED ORDER — INSULIN ASPART 100 UNIT/ML ~~LOC~~ SOLN
5.0000 [IU] | Freq: Once | SUBCUTANEOUS | Status: AC
  Administered 2016-12-03: 5 [IU] via SUBCUTANEOUS

## 2016-12-03 MED ORDER — FUROSEMIDE 10 MG/ML IJ SOLN
160.0000 mg | Freq: Four times a day (QID) | INTRAVENOUS | Status: DC
  Administered 2016-12-03 – 2016-12-05 (×9): 160 mg via INTRAVENOUS
  Filled 2016-12-03 (×2): qty 10
  Filled 2016-12-03: qty 14
  Filled 2016-12-03: qty 10
  Filled 2016-12-03 (×3): qty 16
  Filled 2016-12-03: qty 10
  Filled 2016-12-03: qty 16
  Filled 2016-12-03 (×2): qty 14

## 2016-12-03 MED ORDER — METOLAZONE 5 MG PO TABS
5.0000 mg | ORAL_TABLET | Freq: Every day | ORAL | Status: DC
  Administered 2016-12-03 – 2016-12-14 (×12): 5 mg via ORAL
  Filled 2016-12-03 (×14): qty 1

## 2016-12-03 NOTE — Progress Notes (Signed)
PROGRESS NOTE    John Bailey  JME:[id number] DOB: 12-30-1970 DOA: 12/02/2016 PCP: [name], MD  Brief Narrative:46 y.o. male with a hx of diabetes, hypertension, atrial fibrillation on Xarelto, chronic systolic and diastolic CHF,  CKD4  admitted with severe vol overload, AKI and Afib RVR  Assessment & Plan:    Acute combined systolic (congestive) and diastolic (congestive) heart failure ([place]) -with profound edema, may be 20-30lbs above dry weight -last ECHO with improvement in EF from 40 to 55% and grade 2DD -NICM due to hypertension -see below for diuresis, Afib RVR also contributing may need DCCV depending on clinical course    AKi on CKD4 -due to DM and hypertensive nephropathy and now with cardiorenal syndrome -continue lasix gtt -Renal consult -expect some worsening with diuresis -monitor weight, I/O closely   Edema with fluid blisters -clinically do not suspect acute cellulitis -will hold off on further Abx  -no fevers, check procalcitonin -WBC up will monitor    Paroxysmal atrial fibrillation ([facility]) -with RVR on cardizem gtt -chadsvasc score is 5, on xarelto, may need to be switched to coumadin depending on how creatinine trends -rate suboptimal, monitor, may need DCCV   HTN -BP soft now, hold hydralazine and imdur to allow more room for diuresis    Insulin-dependent diabetes mellitus with renal complications ([place]) -on lantus at home, now SSI -resume at low dose  DVT prophylaxis: xarelto Code Status: Full Code Family Communication: none at bedside Disposition Plan: Keep in SDU today  Consultants:   Cards  Renal   Subjective: feeling better, reports taking lasix but missed some doses at home, propgressive weight gain and DoE for weeks  Objective: Vitals:   12/03/16 0308 12/03/16 0658 12/03/16 0700 12/03/16 0900  BP:   (!) 115/94 (!) 114/99  Pulse:   (!) 107 86  Resp:   18 (!) 28  Temp: (!) 97.4 F (36.3 C) 97.7 F (36.5 C)    TempSrc:  Axillary Oral    SpO2:   99% 95%  Weight:      Height:        Intake/Output Summary (Last 24 hours) at 12/03/16 1134 Last data filed at 12/03/16 1013  Gross per 24 hour  Intake           905.04 ml  Output             1525 ml  Net          -619.96 ml   Filed Weights   12/02/16 1348 12/02/16 2200  Weight: (!) 140.6 kg (310 lb) (!) 145.2 kg (320 lb 1.7 oz)    Examination:  General exam: Obese, male, AAOx3, no distress Respiratory system: decreased BS at bases Cardiovascular system: S1 & S2 heard, RRR. No JVD, murmurs, rubs Gastrointestinal system: Abdomen is nondistended, soft and nontender. Normal bowel sounds heard. Central nervous system: Alert and oriented. No focal neurological deficits. Extremities: 3plus edema extending to thigh, some fluid blisters with skin breakdown Skin: blisters and skin tears Psychiatry: Judgement and insight appear normal. Mood & affect appropriate.     Data Reviewed:   CBC:  Recent Labs Lab 12/02/16 1405 12/03/16 0359  WBC 18.0* 21.3*  NEUTROABS 14.7*  --   HGB 13.0 11.8*  HCT 39.3 36.8*  MCV 80.2 79.8  PLT 374 229   Basic Metabolic Panel:  Recent Labs Lab 12/02/16 1405 12/03/16 0359  NA 131* 130*  K 3.6 4.2  CL 87* 85*  CO2 29 29  GLUCOSE  233* 319*  BUN 115* 128*  CREATININE 3.12* 3.25*  CALCIUM 8.8* 8.3*  MG 1.7  --    GFR: Estimated Creatinine Clearance: 42.4 mL/min (A) (by C-G formula based on SCr of 3.25 mg/dL (H)). Liver Function Tests:  Recent Labs Lab 12/02/16 1405 12/03/16 0359  AST 23 19  ALT 33 28  ALKPHOS 71 59  BILITOT 1.1 1.0  PROT 6.3* 5.7*  ALBUMIN 3.1* 2.6*   No results for input(s): LIPASE, AMYLASE in the last 168 hours. No results for input(s): AMMONIA in the last 168 hours. Coagulation Profile: No results for input(s): INR, PROTIME in the last 168 hours. Cardiac Enzymes: No results for input(s): CKTOTAL, CKMB, CKMBINDEX, TROPONINI in the last 168 hours. BNP (last 3 results) No results  for input(s): PROBNP in the last 8760 hours. HbA1C: No results for input(s): HGBA1C in the last 72 hours. CBG:  Recent Labs Lab 12/02/16 1833 12/03/16 0806  GLUCAP 273* 342*   Lipid Profile: No results for input(s): CHOL, HDL, LDLCALC, TRIG, CHOLHDL, LDLDIRECT in the last 72 hours. Thyroid Function Tests: No results for input(s): TSH, T4TOTAL, FREET4, T3FREE, THYROIDAB in the last 72 hours. Anemia Panel: No results for input(s): VITAMINB12, FOLATE, FERRITIN, TIBC, IRON, RETICCTPCT in the last 72 hours. Urine analysis:    Component Value Date/Time   COLORURINE YELLOW 12/02/2016 1518   APPEARANCEUR CLEAR 12/02/2016 1518   LABSPEC 1.008 12/02/2016 1518   PHURINE 5.0 12/02/2016 1518   GLUCOSEU NEGATIVE 12/02/2016 1518   HGBUR NEGATIVE 12/02/2016 1518   BILIRUBINUR NEGATIVE 12/02/2016 1518   KETONESUR NEGATIVE 12/02/2016 1518   PROTEINUR 30 (A) 12/02/2016 1518   NITRITE NEGATIVE 12/02/2016 1518   LEUKOCYTESUR NEGATIVE 12/02/2016 1518   Sepsis Labs: @LABRCNTIP (procalcitonin:4,lacticidven:4)  ) Recent Results (from the past 240 hour(s))  MRSA PCR Screening     Status: None   Collection Time: 12/02/16 10:01 PM  Result Value Ref Range Status   MRSA by PCR NEGATIVE NEGATIVE Final    Comment:        The GeneXpert MRSA Assay (FDA approved for NASAL specimens only), is one component of a comprehensive MRSA colonization surveillance program. It is not intended to diagnose MRSA infection nor to guide or monitor treatment for MRSA infections.          Radiology Studies: Dg Chest Port 1 View  Result Date: 12/02/2016 CLINICAL DATA:  Shortness of Breath EXAM: PORTABLE CHEST 1 VIEW COMPARISON:  November 22, 2016 FINDINGS: There is no edema or consolidation. Heart is enlarged but stable. Pulmonary vascularity is normal. No adenopathy. No bone lesions. IMPRESSION: Stable cardiomegaly.  No edema or consolidation. Electronically Signed   By: Lowella Grip III M.D.   On:  12/02/2016 16:02        Scheduled Meds: . carvedilol  25 mg Oral BID WC  . colchicine  0.6 mg Oral BID  . gabapentin  300 mg Oral TID  . hydrALAZINE  50 mg Oral BID  . insulin aspart  5 Units Subcutaneous TID WC  . insulin glargine  10 Units Subcutaneous Q2200  . isosorbide dinitrate  10 mg Oral BID  . metolazone  5 mg Oral Daily  . potassium chloride SA  20 mEq Oral Daily  . rivaroxaban  20 mg Oral Q supper  . sodium chloride flush  3 mL Intravenous Q12H   Continuous Infusions: . sodium chloride    . diltiazem (CARDIZEM) infusion 10 mg/hr (12/03/16 1111)  . furosemide    . sodium chloride    .  sodium chloride       LOS: 1 day    Time spent: 59min    Domenic Polite, MD [facility] Pager [phone]  If 7PM-7AM, please contact night-coverage [email or web address] Password [facility] 12/03/2016, 11:34 AM

## 2016-12-03 NOTE — Consult Note (Signed)
Cardiology Consultation:   Patient ID: John Bailey; [id number]; [birth year]   Admit date: 12/02/2016 Date of Consult: 12/03/2016  Primary Care Provider: [name], MD Primary Cardiologist: Dr. Debara Pickett   Patient Profile:   John Bailey is a 46 y.o. male with a hx of diabetes, hypertension, atrial fibrillation on Xarelto, CKD who is being seen today for the evaluation of CHF and afib at the request of Dr. Broadus John.  History of Present Illness:   John Bailey date that he was to present yesterday for a procedure to remove fluid from his body and when he arrived he was noted to be in atrial fibrillation with rapid ventricular response. He states that he had no awareness, no palpitations, being in atrial fibrillation. His complaints was increasing lower extremity edema with weeping and cough. He notes that he ignored a continuous weight gain when he was aware that he shouldn't reported this to his provider. He also states that he has not been taking his medications as directed. He has only been taking his Lasix once a day instead of twice a day and had been increasing his fluid intake to match his urine output. He was taking the metolazone every day. He is unclear about taking carvedilol twice a day, however it appears that he may have been missing doses there as well He states that he has been told recently to avoid excess fluid intake and he has been trying to do better. Last night he was able to lay flat and slept all night.  The patient had TEE/cardioversion in 07/2015 and was successfully cardioverted to sinus rhythm. He was maintaining sinus rhythm according to office notes.  The patient was last seen in the office on 07/31/2016 by Dr. Debara Pickett at which time he was noted to be over 20 pounds above his usual weight. He did not note any edema. He had been on steroids for his joints and had an increased appetite. He was also noted that his blood pressure was elevated but had been normal at  home. Dr. Moshe Cipro, nephrology, was adjusting his diuretics. His creatinine had actually improved. He was maintaining sinus rhythm. His EF had come up from 25-35% to 50-55% by echo 06/25/16. His filling pressures were elevated.  Significant findings: EKG:  Extreme tachycardia at 198 bpm, with wide complex BNP 554.8 Troponin I 0.26 SCr 3.12,  3.25 K+  3.6, 4.2 Hgb  13.0,  11.8 CXR:  Stable cardiomegaly.  No edema or consolidation.   Past Medical History:  Diagnosis Date  . Atrial flutter with rapid ventricular response ([place]) 08/06/2015  . Diabetes ([place])   . Hypertension     Past Surgical History:  Procedure Laterality Date  . CARDIOVERSION N/A 08/01/2015   Procedure: CARDIOVERSION;  Surgeon: Josue Hector, MD;  Location: [facility] ENDOSCOPY;  Service: Cardiovascular;  Laterality: N/A;  . CHOLECYSTECTOMY    . TEE WITHOUT CARDIOVERSION N/A 08/01/2015   Procedure: TRANSESOPHAGEAL ECHOCARDIOGRAM (TEE);  Surgeon: Josue Hector, MD;  Location: [facility] ENDOSCOPY;  Service: Cardiovascular;  Laterality: N/A;     Home Medications:  Prior to Admission medications   Medication Sig Start Date End Date Taking? Authorizing Provider  acetaminophen-codeine (TYLENOL #3) 300-30 MG tablet Take 1 tablet by mouth every 8 (eight) hours as needed for moderate pain or severe pain.  11/26/16  Yes [provider]  carvedilol (COREG) 25 MG tablet Take 1 tablet (25 mg total) by mouth 2 (two) times daily with a meal. 08/19/15  Yes Hilty,  Nadean Corwin, MD  colchicine 0.6 MG tablet Take 1 tablet (0.6 mg total) by mouth 2 (two) times daily. Patient taking differently: Take 0.6 mg by mouth daily.  03/18/16  Yes Tanna Furry, MD  fluticasone [facility]) 50 MCG/ACT nasal spray Place 2 sprays into the nose daily.  12/29/12  Yes [provider]  furosemide (LASIX) 80 MG tablet Take 2 tablets (160 mg total) by mouth 2 (two) times daily. 08/19/15  Yes Hilty, Nadean Corwin, MD  gabapentin (NEURONTIN) 300 MG capsule Take 1 capsule  by mouth 3 (three) times daily. 09/09/15  Yes [provider]  hydrALAZINE (APRESOLINE) 50 MG tablet take 1 tablet by mouth twice a day 10/09/16  Yes Hilty, Nadean Corwin, MD  insulin aspart (NOVOLOG FLEXPEN) 100 UNIT/ML FlexPen Inject 0-12 Units into the skin 3 (three) times daily as needed for high blood sugar. Sliding Scale <70, initiate hypoglycemia protocol, 70-100=0 units, 110-199=0 units, 200-250=4 units, 251-300=6 units, 301-350=8 units, 351-400=10 units, >400=12 units. Call MD if >450   Yes [provider]  insulin aspart (NOVOLOG PENFILL) cartridge Inject 5 Units into the skin 3 (three) times daily with meals. 08/08/15  Yes Charlynne Cousins, MD  Insulin Glargine (LANTUS) 100 UNIT/ML Solostar Pen Inject 10 Units into the skin daily at 10 pm. Patient taking differently: Inject 15 Units into the skin daily at 10 pm.  08/08/15  Yes Charlynne Cousins, MD  isosorbide dinitrate (ISORDIL) 10 MG tablet take 1 tablet by mouth twice a day 10/09/16  Yes Hilty, Nadean Corwin, MD  metolazone (ZAROXOLYN) 5 MG tablet Take 5 mg by mouth daily.    Yes [provider]  Multiple Vitamin (MULTIVITAMIN WITH MINERALS) TABS tablet Take 2 tablets by mouth daily.   Yes [provider]  potassium chloride SA (KLOR-CON M20) 20 MEQ tablet Take 1 tablet (20 mEq total) by mouth daily. 08/19/15  Yes Hilty, Nadean Corwin, MD  predniSONE (DELTASONE) 5 MG tablet Take 10 mg by mouth daily with breakfast.  11/13/16  Yes [provider]  rivaroxaban (XARELTO) 20 MG TABS tablet take 1 tablet by mouth once daily WITH SUPPER 10/12/16  Yes Hilty, Nadean Corwin, MD  albuterol (PROVENTIL HFA;VENTOLIN HFA) 108 (90 BASE) MCG/ACT inhaler Inhale 1-2 puffs into the lungs every 6 (six) hours as needed for wheezing. 05/30/14   Nicolet Griffy, Wonda Cheng, MD  docusate sodium (COLACE) 100 MG capsule Take 1 capsule (100 mg total) by mouth every 12 (twelve) hours. Patient not taking: Reported on 12/02/2016 11/22/16   Orpah Greek, MD  HYDROcodone-acetaminophen (NORCO/VICODIN) 5-325 MG tablet Take 1-2 tablets by mouth every 6 (six) hours as needed for severe pain. Patient not taking: Reported on 12/02/2016 09/05/15   Blanchie Dessert, MD  Ibuprofen-Famotidine (DUEXIS) 800-26.6 MG TABS Take 1 tablet by mouth 2 (two) times daily. Patient not taking: Reported on 12/02/2016 06/23/16   Meredith Pel, MD  Insulin Pen Needle 31G X 6 MM MISC 1 Device by Does not apply route 2 (two) times daily. 08/08/15   Charlynne Cousins, MD  lactulose (CHRONULAC) 10 GM/15ML solution Take 15 mLs (10 g total) by mouth 2 (two) times daily as needed for moderate constipation. Patient not taking: Reported on 12/02/2016 11/22/16   Orpah Greek, MD  methylPREDNISolone (MEDROL DOSEPAK) 4 MG TBPK tablet 6 po on day 1, then decrease by 1 per day Patient not taking: Reported on 12/02/2016 03/18/16   Tanna Furry, MD  oxyCODONE-acetaminophen (PERCOCET/ROXICET) 5-325 MG tablet Take 2 tablets  by mouth every 4 (four) hours as needed. Patient not taking: Reported on 12/02/2016 03/18/16   Tanna Furry, MD    Inpatient Medications: Scheduled Meds: . carvedilol  25 mg Oral BID WC  . colchicine  0.6 mg Oral BID  . gabapentin  300 mg Oral TID  . hydrALAZINE  50 mg Oral BID  . insulin aspart  5 Units Subcutaneous TID WC  . insulin glargine  10 Units Subcutaneous Q2200  . isosorbide dinitrate  10 mg Oral BID  . [START ON 12/04/2016] metolazone  5 mg Oral QODAY  . potassium chloride SA  20 mEq Oral Daily  . rivaroxaban  20 mg Oral Q supper  . sodium chloride flush  3 mL Intravenous Q12H   Continuous Infusions: . sodium chloride    . clindamycin (CLEOCIN) IV Stopped (12/03/16 0130)  . diltiazem (CARDIZEM) infusion 15 mg/hr (12/03/16 0553)  . furosemide (LASIX) infusion 10 mg/hr (12/03/16 0200)  . piperacillin-tazobactam (ZOSYN)  IV 3.375 g (12/03/16 0404)  . sodium chloride    . sodium chloride     PRN Meds: sodium chloride,  acetaminophen **OR** acetaminophen, albuterol, levalbuterol, ondansetron **OR** ondansetron (ZOFRAN) IV, oxyCODONE, sodium chloride flush  Allergies:   No Known Allergies  Social History:   Social History   Social History  . Marital status: Married    Spouse name: N/A  . Number of children: N/A  . Years of education: N/A   Occupational History  . Not on file.   Social History Main Topics  . Smoking status: Never Smoker  . Smokeless tobacco: Never Used  . Alcohol use No  . Drug use: No  . Sexual activity: Yes   Other Topics Concern  . Not on file   Social History Narrative  . No narrative on file    Family History:    Family History  Problem Relation Age of Onset  . Heart disease Mother   . Hyperlipidemia Father   . Hypertension Father      ROS:  Please see the history of present illness.  ROS  All other ROS reviewed and negative.     Physical Exam/Data:   Vitals:   12/03/16 0100 12/03/16 0200 12/03/16 0308 12/03/16 0658  BP: 101/74 97/67    Pulse:      Resp: (!) 25 (!) 25    Temp:   (!) 97.4 F (36.3 C) 97.7 F (36.5 C)  TempSrc:   Axillary Oral  SpO2:  92%    Weight:      Height:        Intake/Output Summary (Last 24 hours) at 12/03/16 0749 Last data filed at 12/03/16 0658  Gross per 24 hour  Intake           730.04 ml  Output             1350 ml  Net          -619.96 ml   Filed Weights   12/02/16 1348 12/02/16 2200  Weight: (!) 310 lb (140.6 kg) (!) 320 lb 1.7 oz (145.2 kg)   Body mass index is 44.65 kg/m.  General:  Obese male, in no acute distress HEENT: normal Lymph: no adenopathy Neck: No apparent JVD Endocrine:  No thryomegaly Vascular: No carotid bruits; FA pulses 2+ bilaterally without bruits  Cardiac:  normal S1, S2; Irreg, irreg. ; no murmur  Lungs:  Rales in the bases Abd: soft, nontender, no hepatomegaly  Ext: 3+ lower extremity edema all the way  to his and including his abdomen  Musculoskeletal:  No deformities, BUE and  BLE strength normal and equal Skin: warm and dry  Neuro:  CNs 2-12 intact, no focal abnormalities noted Psych:  Normal affect   EKG:  The EKG was personally reviewed and demonstrates:  Tachycardic at 198 bpm, difficult to interpret, wide QRS related to ventricular versus aberrancy Telemetry:  Telemetry was personally reviewed and demonstrates:  Atrial fibrillation with rates in the 90s to 100s  Relevant CV Studies:  Echocardiogram 06/25/16 Study Conclusions - Left ventricle: The cavity size is small. 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 Systolic function was normal. The   estimated ejection fraction was in the range of 50% to 55%.   Distal anterior/apical hypokinesis seen on Definity contrast   images. Doppler parameters are consistent with restrictive   physiology (grade 3 diastolic dysfunction). The E/A ratio is   >2.5. The E/e&' ratio is >20, suggesting markedly elevated LV   filling pressure. - Mitral valve: Mitral B-notch noted, suggesting elevated LA   pressure. - Left atrium: Moderately dilated. - Right ventricle: The cavity size was mildly dilated. Systolic   function was normal. - Right atrium: Severely dilated. - Tricuspid valve: There was trivial regurgitation. - Pulmonary arteries: PA peak pressure: 19 mm Hg (S). - Inferior vena cava: The vessel was normal in size. The   respirophasic diameter changes were in the normal range (>= 50%),   consistent with normal central venous pressure. - Pericardium, extracardiac: Small pericardial effusion. Features   were not consistent with tamponade physiology.  Impressions: - Compared to a prior study in 2017, the LVEF has increased to   50-55% with distal anterior/apical hypokinesis as seen on   Definity contrast images.   Laboratory Data:  Chemistry Recent Labs Lab 12/02/16 1405 12/03/16 0359  NA 131* 130*  K 3.6 4.2  CL 87* 85*  CO2 29  29  GLUCOSE 233* 319*  BUN 115* 128*  CREATININE 3.12* 3.25*  CALCIUM 8.8* 8.3*  GFRNONAA 23* 22*  GFRAA 26* 25*  ANIONGAP 15 16*     Recent Labs Lab 12/02/16 1405 12/03/16 0359  PROT 6.3* 5.7*  ALBUMIN 3.1* 2.6*  AST 23 19  ALT 33 28  ALKPHOS 71 59  BILITOT 1.1 1.0   Hematology Recent Labs Lab 12/02/16 1405 12/03/16 0359  WBC 18.0* 21.3*  RBC 4.90 4.61  HGB 13.0 11.8*  HCT 39.3 36.8*  MCV 80.2 79.8  MCH 26.5 25.6*  MCHC 33.1 32.1  RDW 16.3* 16.4*  PLT 374 315   Cardiac EnzymesNo results for input(s): TROPONINI in the last 168 hours.  Recent Labs Lab 12/02/16 1416  TROPIPOC 0.26*    BNP Recent Labs Lab 12/02/16 1405  BNP 554.8*    DDimer No results for input(s): DDIMER in the last 168 hours.  Radiology/Studies:  Dg Chest Port 1 View  Result Date: 12/02/2016 CLINICAL DATA:  Shortness of Breath EXAM: PORTABLE CHEST 1 VIEW COMPARISON:  November 22, 2016 FINDINGS: There is no edema or consolidation. Heart is enlarged but stable. Pulmonary vascularity is normal. No adenopathy. No bone lesions. IMPRESSION: Stable cardiomegaly.  No edema or consolidation. Electronically Signed   By: Lowella Grip III M.D.   On: 12/02/2016 16:02    Assessment and Plan:   Acute on chronic combined systolic and diastolic heart failure -EF has been down to 25-35% in the past. Was 50-55% in April.  May be down currently in setting of afib with RVR.  -Patient with increasing weight over the last several months and taking his medications and consistently. Has 3+ lower extremity edema and rales in the bases of his lungs -home diuresis with Lasix 160 mg twice daily and metolazone 5 mg daily, managed by nephrology -BNP 554.8 -CXR:  Stable cardiomegaly.  No edema or consolidation.  -Current wt 320 lbs. Wt at office visit in May was 312 and was thought to be 20 lbs up at that time. Wt was 291 in January.  -Pt is volume overloaded, possibly related to afib with RVR, but has been volume  overloaded at most recent office visits. Diuresis was being managed by nehprology as the pt is quite diuretic resistant. The patient admits that he was not taking his diuretics as prescribed. He was taking Lasix once a day instead of twice a day and sometimes missing days altogether. He states that he was taking his metolazone daily. -Now on lasix drip at 10 mg/hr. UOP is only 1.3L. Need updated wt.  -Will discuss further diuretic recommendations with Dr. Acie Fredrickson.   Atrial fibrillation -Patient had TEE/cardioversion in 07/2015 and has been maintaining sinus rhythm since then. On carvedilol 25 mg twice a day -CHA2DS2/VAS Stroke Risk Score is 5 (CHF, HTN, DM, stroke(2)). He has anticoagulated with Xarelto 20 mg daily. May need adjustment with worse renal function.  -Cardizem drip infusing at 15 mg/hr. BP is stable.  -Continue to diurese and monitor cardiac rhythm. We'll consider cardioversion if continues in atrial fibrillation.    Acute on chronic CKD -Baseline SCr around 2.5. Is now 3.12. BUN is 128. -Renal consult would be of benefit. Patient's fluid status was being managed by nephrology.   Hypertension -Patient on carvedilol 25 mg twice a day, hydralazine 50 mg twice a day, Isordil 10 mg twice a day -Patient is on Cardizem for atrial fibrillation. Blood pressure is currently well controlled   For questions or updates, please contact [place] Please consult [email or web address] for contact info under Cardiology/STEMI.   Signed, Daune Perch, NP  12/03/2016 7:49 AM   Attending Note:   The patient was seen and examined.  Agree with assessment and plan as noted above.  Changes made to the above note as needed.  Patient seen and independently examined with Pecolia Ades, NP.   We discussed all aspects of the encounter. I agree with the assessment and plan as stated above.  1.   Acute on chronic diastolic CHF : I have seen John Bailey in the past for this same issue.   He has not been  compliant with his diet or his medications.   Has ignored a 20 lb weight gain recently  Has 3+ pitting edema up to his abdomen  Continue IV Lasix. We have successfully diuresed him 620 cc  So far.  . He has chronic kidney disease and is fairly resistant to oral diuretics.   This is managed by nephrology  Echocardiogram in April, 2018 reveals normal left ventricle systolic function. He has grade 3 diastolic dysfunction.   2. Atrial fibrillation: His rate is much better controlled. I question him about his Xarelto. He is not completely sure if he has taken the Xarelto as prescribed for the past month.  Currently his rate is well-controlled I do not think that we need to urgently cardiovert him. If the cardioversion is needed, he'll need to have a transesophageal echo at the same time. Continue current dose of carvedilol Tele currently  shows atrial fib with controlled HR in the 70-80 range.   3. Morbid obesity: He  needs to lose weight.    4.  Diabetes:   Glucose is not controlled. Not a candidate for Jardiance due to his renal insufficiency    I have spent a total of 40 minutes with patient reviewing hospital  notes , telemetry, EKGs, labs and examining patient as well as establishing an assessment and plan that was discussed with the patient. > 50% of time was spent in direct patient care.    Thayer Headings, Brooke Bonito., MD, [facility] 12/03/2016, 12:24 PM [address]  [place] Pager [phone]

## 2016-12-03 NOTE — Consult Note (Addendum)
Renal Service Consult Note [place] 12/03/2016 [place] Requesting Physician:    Reason for Consult:  CKD 3/4, vol overload HPI: The patient is a 46 y.o. 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  Pt presented yest with c/o bilat LE swelling, SOB/ DOE, and weeping fluid from the LE's.  CXR showed no edema.  Pt admitted for decomp CHF/ vol overload and started on lasix gtt and IV abx for LLE cellulitis.    Asked to see for A / CKD.    Patient says he has not been taking care of himself, not taking his medications as prescribed, not watching what he eats and drinks.  States this is a "big wake-up call" for him.  Denies orthopnea, CP, n/v/d, abd pain.  Lives w/ his wife and 2 sons in grade school, they live in [place].  No tob/ etoh.    UA negative.   ROS  denies CP  no joint pain   no HA  no blurry vision  no rash  no diarrhea  no nausea/ vomiting  no dysuria  no difficulty voiding  no change in urine color    Past Medical History  Past Medical History:  Diagnosis Date  . Atrial flutter with rapid ventricular response ([place]) 08/06/2015  . Diabetes ([place])   . Hypertension    Past Surgical History  Past Surgical History:  Procedure Laterality Date  . CARDIOVERSION N/A 08/01/2015   Procedure: CARDIOVERSION;  Surgeon: Josue Hector, MD;  Location: [facility] ENDOSCOPY;  Service: Cardiovascular;  Laterality: N/A;  . CHOLECYSTECTOMY    . TEE WITHOUT CARDIOVERSION N/A 08/01/2015   Procedure: TRANSESOPHAGEAL ECHOCARDIOGRAM (TEE);  Surgeon: Josue Hector, MD;  Location: [facility] ENDOSCOPY;  Service: Cardiovascular;  Laterality: N/A;   Family History  Family History  Problem Relation Age of Onset  . Heart disease Mother   . Hyperlipidemia Father   . Hypertension Father    Social History  reports that he has never smoked. He has never used smokeless tobacco. He reports that he  does not drink alcohol or use drugs. Allergies No Known Allergies Home medications Prior to Admission medications   Medication Sig Start Date End Date Taking? Authorizing Provider  acetaminophen-codeine (TYLENOL #3) 300-30 MG tablet Take 1 tablet by mouth every 8 (eight) hours as needed for moderate pain or severe pain.  11/26/16  Yes [provider]  carvedilol (COREG) 25 MG tablet Take 1 tablet (25 mg total) by mouth 2 (two) times daily with a meal. 08/19/15  Yes Hilty, Nadean Corwin, MD  colchicine 0.6 MG tablet Take 1 tablet (0.6 mg total) by mouth 2 (two) times daily. Patient taking differently: Take 0.6 mg by mouth daily.  03/18/16  Yes Tanna Furry, MD  fluticasone [facility]) 50 MCG/ACT nasal spray Place 2 sprays into the nose daily.  12/29/12  Yes [provider]  furosemide (LASIX) 80 MG tablet Take 2 tablets (160 mg total) by mouth 2 (two) times daily. 08/19/15  Yes Hilty, Nadean Corwin, MD  gabapentin (NEURONTIN) 300 MG capsule Take 1 capsule by mouth 3 (three) times daily. 09/09/15  Yes [provider]  hydrALAZINE (APRESOLINE) 50 MG tablet take 1 tablet by mouth twice a day 10/09/16  Yes Hilty, Nadean Corwin, MD  insulin aspart (NOVOLOG FLEXPEN) 100 UNIT/ML FlexPen Inject 0-12 Units into the skin 3 (three) times daily as needed for high  blood sugar. Sliding Scale <70, initiate hypoglycemia protocol, 70-100=0 units, 110-199=0 units, 200-250=4 units, 251-300=6 units, 301-350=8 units, 351-400=10 units, >400=12 units. Call MD if >450   Yes [provider]  insulin aspart (NOVOLOG PENFILL) cartridge Inject 5 Units into the skin 3 (three) times daily with meals. 08/08/15  Yes Charlynne Cousins, MD  Insulin Glargine (LANTUS) 100 UNIT/ML Solostar Pen Inject 10 Units into the skin daily at 10 pm. Patient taking differently: Inject 15 Units into the skin daily at 10 pm.  08/08/15  Yes Charlynne Cousins, MD  isosorbide dinitrate (ISORDIL) 10 MG tablet take 1 tablet by mouth twice  a day 10/09/16  Yes Hilty, Nadean Corwin, MD  metolazone (ZAROXOLYN) 5 MG tablet Take 5 mg by mouth daily.    Yes [provider]  Multiple Vitamin (MULTIVITAMIN WITH MINERALS) TABS tablet Take 2 tablets by mouth daily.   Yes [provider]  potassium chloride SA (KLOR-CON M20) 20 MEQ tablet Take 1 tablet (20 mEq total) by mouth daily. 08/19/15  Yes Hilty, Nadean Corwin, MD  predniSONE (DELTASONE) 5 MG tablet Take 10 mg by mouth daily with breakfast.  11/13/16  Yes [provider]  rivaroxaban (XARELTO) 20 MG TABS tablet take 1 tablet by mouth once daily WITH SUPPER 10/12/16  Yes Hilty, Nadean Corwin, MD  albuterol (PROVENTIL HFA;VENTOLIN HFA) 108 (90 BASE) MCG/ACT inhaler Inhale 1-2 puffs into the lungs every 6 (six) hours as needed for wheezing. 05/30/14   Nahser, Wonda Cheng, MD  docusate sodium (COLACE) 100 MG capsule Take 1 capsule (100 mg total) by mouth every 12 (twelve) hours. Patient not taking: Reported on 12/02/2016 11/22/16   Orpah Greek, MD  HYDROcodone-acetaminophen (NORCO/VICODIN) 5-325 MG tablet Take 1-2 tablets by mouth every 6 (six) hours as needed for severe pain. Patient not taking: Reported on 12/02/2016 09/05/15   Blanchie Dessert, MD  Ibuprofen-Famotidine (DUEXIS) 800-26.6 MG TABS Take 1 tablet by mouth 2 (two) times daily. Patient not taking: Reported on 12/02/2016 06/23/16   Meredith Pel, MD  Insulin Pen Needle 31G X 6 MM MISC 1 Device by Does not apply route 2 (two) times daily. 08/08/15   Charlynne Cousins, MD  lactulose (CHRONULAC) 10 GM/15ML solution Take 15 mLs (10 g total) by mouth 2 (two) times daily as needed for moderate constipation. Patient not taking: Reported on 12/02/2016 11/22/16   Orpah Greek, MD  methylPREDNISolone (MEDROL DOSEPAK) 4 MG TBPK tablet 6 po on day 1, then decrease by 1 per day Patient not taking: Reported on 12/02/2016 03/18/16   Tanna Furry, MD  oxyCODONE-acetaminophen (PERCOCET/ROXICET) 5-325 MG tablet Take 2 tablets  by mouth every 4 (four) hours as needed. Patient not taking: Reported on 12/02/2016 03/18/16   Tanna Furry, MD   Liver Function Tests  Recent Labs Lab 12/02/16 1405 12/03/16 0359  AST 23 19  ALT 33 28  ALKPHOS 71 59  BILITOT 1.1 1.0  PROT 6.3* 5.7*  ALBUMIN 3.1* 2.6*   No results for input(s): LIPASE, AMYLASE in the last 168 hours. CBC  Recent Labs Lab 12/02/16 1405 12/03/16 0359  WBC 18.0* 21.3*  NEUTROABS 14.7*  --   HGB 13.0 11.8*  HCT 39.3 36.8*  MCV 80.2 79.8  PLT 374 732   Basic Metabolic Panel  Recent Labs Lab 12/02/16 1405 12/03/16 0359  NA 131* 130*  K 3.6 4.2  CL 87* 85*  CO2 29 29  GLUCOSE 233* 319*  BUN 115* 128*  CREATININE 3.12* 3.25*  CALCIUM 8.8* 8.3*   Iron/TIBC/Ferritin/ %Sat No results found for: IRON, TIBC, FERRITIN, IRONPCTSAT  Vitals:   12/03/16 0308 12/03/16 0658 12/03/16 0700 12/03/16 0900  BP:   (!) 115/94 (!) 114/99  Pulse:   (!) 107 86  Resp:   18 (!) 28  Temp: (!) 97.4 F (36.3 C) 97.7 F (36.5 C)    TempSrc: Axillary Oral    SpO2:   99% 95%  Weight:      Height:       Exam Gen obese, no distress, calm No rash, cyanosis or gangrene Sclera anicteric, throat clear  JVD to angle of jaw Chest rales R base, L clear RRR no MRG Abd soft ntnd no mass or ascites +bs obese GU normal male MS no joint effusions or deformity Ext 3+ LLE edema w erythema/ 2-3+ RLE edema up to the hips bilat Neuro is alert, Ox 3 , nf  Na 130  K 4.2  CO2 29  BUn 128  Cr 3.25  Alb 2.6 BP's 100- 120/ 60- 80 RA 92% CXR - vasc congestion  Home mes: -lasix 160 bid/ coreg 25 bid/ hydralazine 50 bid/ zaroxolyn 5 qd -xarelto -KCl/ isordil 10 bid/ colace/ flonase/ insulin novolog/ colchicine/ neurontin 300 tid/  -pred 10 qd/ MVI / Duexis (nsaid/ H2B combo) -prns = tylenol/ lactulose/ percocet/ albuterol/   Impression: 1.  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  Plan is to diurese.  DC nsaid's and PPI's  (Duexis), not good for renal fxn.  2.  Vol overload / anasarca - will change lasix to 160mg  every 6 hrs bolus IV and ^zaroxolyn to 5/ d.   3.  NICM - EF 35% 4.  HTN - BP's soft, on coreg and IV dilt, may need to decrease; will defer to cardiology 5.  DM2 on insulin 6.  AFib w RVR - on IV dilt    Plan - as above.    Kelly Splinter MD [employer] pager [phone]   12/03/2016, 9:57 AM

## 2016-12-03 NOTE — Progress Notes (Signed)
Inpatient Diabetes Program Recommendations  AACE/ADA: New Consensus Statement on Inpatient Glycemic Control (2015)  Target Ranges:  Prepandial:   less than 140 mg/dL      Peak postprandial:   less than 180 mg/dL (1-2 hours)      Critically ill patients:  140 - 180 mg/dL   Lab Results  Component Value Date   GLUCAP 353 (H) 12/03/2016   HGBA1C 9.7 (H) 07/31/2015    Review of Glycemic Control  Diabetes history: DM2 Outpatient Diabetes medications: Novolog 0-12 units tidwc, Lantus 15 units QHS Current orders for Inpatient glycemic control: Lantus 10 units QHS, Novolog 5 units tidwc Takes Prednisone 10 mg QD HgbA1C - 9.7% on 07/30/2016 Blood sugars in 300s.  Spoke with pt about his glucose control at home. No problems getting insulin or supplies. Checks blood sugars at home. Sees PCP for diabetes management.  Inpatient Diabetes Program Recommendations:    Add Novolog 0-9 units tidwc and hs Increase Lantus to 20 units QHS Need updated HgbA1C.  Thank you. Lorenda Peck, RD, LDN, CDE Inpatient Diabetes Coordinator [phone]

## 2016-12-03 NOTE — Care Management Note (Signed)
Case Management Note  Patient Details  Name: John Bailey MRN: [id number] Date of Birth: 03-Jan-1971  Subjective/Objective:                  IV cardizem   Action/Plan: Date:  December 03, 2016 Chart reviewed for concurrent status and case management needs.  Will continue to follow patient progress.  Discharge Planning: following for needs  Expected discharge date: [id number]  Velva Harman, BSN, [place], [place]   Expected Discharge Date:   (unknown)               Expected Discharge Plan:  Home/Self Care  In-House Referral:     Discharge planning Services  CM Consult  Post Acute Care Choice:    Choice offered to:     DME Arranged:    DME Agency:     HH Arranged:    [place] Agency:     Status of Service:  In process, will continue to follow  If discussed at Long Length of Stay Meetings, dates discussed:    Additional Comments:  Leeroy Cha, RN 12/03/2016, 9:38 AM

## 2016-12-04 LAB — HEMOGLOBIN A1C
Hgb A1c MFr Bld: 14.5 % — ABNORMAL HIGH (ref 4.8–5.6)
Mean Plasma Glucose: 369 mg/dL

## 2016-12-04 LAB — BASIC METABOLIC PANEL
ANION GAP: 16 — AB (ref 5–15)
BUN: 109 mg/dL — ABNORMAL HIGH (ref 6–20)
CHLORIDE: 84 mmol/L — AB (ref 101–111)
CO2: 29 mmol/L (ref 22–32)
Calcium: 8.2 mg/dL — ABNORMAL LOW (ref 8.9–10.3)
Creatinine, Ser: 3.34 mg/dL — ABNORMAL HIGH (ref 0.61–1.24)
GFR calc Af Amer: 24 mL/min — ABNORMAL LOW (ref >60)
GFR, EST NON AFRICAN AMERICAN: 21 mL/min — AB (ref >60)
GLUCOSE: 297 mg/dL — AB (ref 65–99)
POTASSIUM: 3.1 mmol/L — AB (ref 3.5–5.1)
Sodium: 129 mmol/L — ABNORMAL LOW (ref 135–145)

## 2016-12-04 LAB — GLUCOSE, CAPILLARY
GLUCOSE-CAPILLARY: 411 mg/dL — AB (ref 65–99)
Glucose-Capillary: 224 mg/dL — ABNORMAL HIGH (ref 65–99)
Glucose-Capillary: 297 mg/dL — ABNORMAL HIGH (ref 65–99)
Glucose-Capillary: 354 mg/dL — ABNORMAL HIGH (ref 65–99)

## 2016-12-04 LAB — CBC
HCT: 36.6 % — ABNORMAL LOW (ref 39.0–52.0)
HEMOGLOBIN: 11.8 g/dL — AB (ref 13.0–17.0)
MCH: 25.5 pg — AB (ref 26.0–34.0)
MCHC: 32.2 g/dL (ref 30.0–36.0)
MCV: 79 fL (ref 78.0–100.0)
PLATELETS: 333 10*3/uL (ref 150–400)
RBC: 4.63 MIL/uL (ref 4.22–5.81)
RDW: 16.2 % — ABNORMAL HIGH (ref 11.5–15.5)
WBC: 15.2 10*3/uL — AB (ref 4.0–10.5)

## 2016-12-04 LAB — MAGNESIUM: Magnesium: 1.5 mg/dL — ABNORMAL LOW (ref 1.7–2.4)

## 2016-12-04 MED ORDER — POTASSIUM CHLORIDE CRYS ER 20 MEQ PO TBCR: 40.0000 meq | EXTENDED_RELEASE_TABLET | Freq: Two times a day (BID) | ORAL | Status: DC

## 2016-12-04 MED ORDER — INSULIN GLARGINE 100 UNIT/ML ~~LOC~~ SOLN
15.0000 [IU] | Freq: Every day | SUBCUTANEOUS | Status: DC
  Administered 2016-12-04: 15 [IU] via SUBCUTANEOUS
  Filled 2016-12-04 (×2): qty 0.15

## 2016-12-04 MED ORDER — DILTIAZEM HCL ER COATED BEADS 120 MG PO CP24
240.0000 mg | ORAL_CAPSULE | Freq: Every day | ORAL | Status: DC
  Administered 2016-12-04 – 2016-12-05 (×2): 240 mg via ORAL
  Filled 2016-12-04 (×2): qty 2

## 2016-12-04 MED ORDER — POTASSIUM CHLORIDE CRYS ER 20 MEQ PO TBCR
40.0000 meq | EXTENDED_RELEASE_TABLET | Freq: Once | ORAL | Status: AC
  Administered 2016-12-04: 40 meq via ORAL

## 2016-12-04 MED ORDER — MAGNESIUM SULFATE 2 GM/50ML IV SOLN
2.0000 g | Freq: Once | INTRAVENOUS | Status: AC
  Administered 2016-12-04: 2 g via INTRAVENOUS
  Filled 2016-12-04: qty 50

## 2016-12-04 MED ORDER — POTASSIUM CHLORIDE CRYS ER 20 MEQ PO TBCR
20.0000 meq | EXTENDED_RELEASE_TABLET | Freq: Every day | ORAL | Status: DC
  Administered 2016-12-04 – 2016-12-05 (×2): 20 meq via ORAL
  Filled 2016-12-04 (×2): qty 1

## 2016-12-04 NOTE — Progress Notes (Signed)
Results for KATHLEEN, LIKINS (MRN [id number]) as of 12/04/2016 08:53  Ref. Range 12/03/2016 08:06 12/03/2016 12:24 12/03/2016 15:49 12/03/2016 21:41 12/04/2016 07:38  Glucose-Capillary Latest Ref Range: 65 - 99 mg/dL 342 (H) 367 (H) 353 (H) 368 (H) 224 (H)  Noted that blood sugars continue to be greater than 180 mg/dl.  Recommend increasing Lantus to 20 units daily, adding Novolog SENSITIVE correction scale TID & HS, and continuing the Novolog 5 U TID with meals. Will continue to monitor blood sugars while in the hospital.   Harvel Ricks RN BSN CDE Diabetes Coordinator Pager: [phone]  8am-5pm

## 2016-12-04 NOTE — Progress Notes (Signed)
Progress Note  Patient Name: John Bailey Date of Encounter: 12/04/2016  Primary Cardiologist: Debara Pickett   Subjective   John Bailey  is a 46 year old gentleman with a history of hypertension and just of heart failure in the past. He has been noncompliant with his medications. He was admitted with acute on chronic diastolic congestive heart failure.  He was reported as having a wide-complex tachycardia. I've reviewed the telemetry monitor.   . This was actually just his  atrial fibrillation with a right bundle-branch block both of which are chronic for him.  He has been diuresed 2.2 L so far during this admission. Weight today is 317 pounds  Inpatient Medications    Scheduled Meds: . carvedilol  25 mg Oral BID WC  . colchicine  0.6 mg Oral BID  . gabapentin  300 mg Oral TID  . insulin aspart  5 Units Subcutaneous TID WC  . insulin glargine  10 Units Subcutaneous Q2200  . metolazone  5 mg Oral Daily  . potassium chloride  20 mEq Oral Daily  . rivaroxaban  20 mg Oral Q supper  . sodium chloride flush  3 mL Intravenous Q12H   Continuous Infusions: . sodium chloride    . diltiazem (CARDIZEM) infusion 10 mg/hr (12/04/16 9417)  . furosemide Stopped (12/04/16 0735)  . sodium chloride    . sodium chloride     PRN Meds: sodium chloride, acetaminophen **OR** acetaminophen, albuterol, levalbuterol, ondansetron **OR** ondansetron (ZOFRAN) IV, oxyCODONE, sodium chloride flush   Vital Signs    Vitals:   12/04/16 0300 12/04/16 0352 12/04/16 0748 12/04/16 0752  BP: 104/86   (!) 115/94  Pulse:    (!) 28  Resp:    (!) 21  Temp:  98.1 F (36.7 C)    TempSrc:  Oral    SpO2:    97%  Weight:   (!) 317 lb 14.5 oz (144.2 kg)   Height:        Intake/Output Summary (Last 24 hours) at 12/04/16 4081 Last data filed at 12/04/16 0800  Gross per 24 hour  Intake           585.67 ml  Output             2150 ml  Net         -1564.33 ml   Filed Weights   12/02/16 1348 12/02/16 2200 12/04/16  0748  Weight: (!) 310 lb (140.6 kg) (!) 320 lb 1.7 oz (145.2 kg) (!) 317 lb 14.5 oz (144.2 kg)    Telemetry    Atrial fib with RVR  - Personally Reviewed  ECG    Atrial fib with RVR ,  RBBB  - Personally Reviewed  Physical Exam   GEN:  Morbidly obese young gentleman. In no acute distress.   Neck:  moderately elevated JVD Cardiac:  irregular irregular, no murmurs, rubs, or gallops.  Respiratory: Clear to auscultation bilaterally. GI: Soft, nontender, non-distended  MS: 2-3 + pitting edema in both lower legs up above knees.. Neuro:  Nonfocal  Psych: Normal affect   Labs    Chemistry Recent Labs Lab 12/02/16 1405 12/03/16 0359 12/04/16 0340  NA 131* 130* 129*  K 3.6 4.2 3.1*  CL 87* 85* 84*  CO2 29 29 29   GLUCOSE 233* 319* 297*  BUN 115* 128* 109*  CREATININE 3.12* 3.25* 3.34*  CALCIUM 8.8* 8.3* 8.2*  PROT 6.3* 5.7*  --   ALBUMIN 3.1* 2.6*  --   AST 23 19  --  ALT 33 28  --   ALKPHOS 71 59  --   BILITOT 1.1 1.0  --   GFRNONAA 23* 22* 21*  GFRAA 26* 25* 24*  ANIONGAP 15 16* 16*     Hematology Recent Labs Lab 12/02/16 1405 12/03/16 0359 12/04/16 0340  WBC 18.0* 21.3* 15.2*  RBC 4.90 4.61 4.63  HGB 13.0 11.8* 11.8*  HCT 39.3 36.8* 36.6*  MCV 80.2 79.8 79.0  MCH 26.5 25.6* 25.5*  MCHC 33.1 32.1 32.2  RDW 16.3* 16.4* 16.2*  PLT 374 315 333    Cardiac EnzymesNo results for input(s): TROPONINI in the last 168 hours.  Recent Labs Lab 12/02/16 1416  TROPIPOC 0.26*     BNP Recent Labs Lab 12/02/16 1405  BNP 554.8*     DDimer No results for input(s): DDIMER in the last 168 hours.   Radiology    Dg Chest Port 1 View  Result Date: 12/02/2016 CLINICAL DATA:  Shortness of Breath EXAM: PORTABLE CHEST 1 VIEW COMPARISON:  November 22, 2016 FINDINGS: There is no edema or consolidation. Heart is enlarged but stable. Pulmonary vascularity is normal. No adenopathy. No bone lesions. IMPRESSION: Stable cardiomegaly.  No edema or consolidation.  Electronically Signed   By: Lowella Grip III M.D.   On: 12/02/2016 16:02    Cardiac Studies     Patient Profile     46 y.o. male admitted with falling overload. He has a history of chronic diastolic congestive heart failure  Assessment & Plan    1. Acute on chronic diastolic congestive heart failure. John Bailey has had a long history of chronic diastolic congestive heart failure. He had been noncompliant with his diet and his medications. He is responding very well to the IV Lasix. He's diuresed 2.2 L during his hospitalization. He's breathing much better.  I would recommend continuing the IV Lasix. He still has significant leg edema.  2. Atrial fibrillation: His rate is fairly well-controlled on IV diltiazem. I think that we can try to get him on oral diltiazem Have ordered Cardizem cd 2 and 40 mg a day. We'll discontinue the diltiazem drip as tolerated Continue xarelto   For questions or updates, please contact [place] Please consult [email or web address] for contact info under Cardiology/STEMI.      Signed, Mertie Moores, MD  12/04/2016, 8:32 AM

## 2016-12-04 NOTE — Progress Notes (Signed)
PROGRESS NOTE    John Bailey  DJM:[id number] DOB: [birth year] DOA: 12/02/2016 PCP: [name], MD  Brief Narrative:46 y.o. male with a hx of diabetes, hypertension, atrial fibrillation on Xarelto, chronic systolic and diastolic CHF,  CKD4  admitted with severe vol overload, AKI and Afib RVR  Assessment & Plan:    Acute combined systolic (congestive) and diastolic (congestive) heart failure ([place]) -with profound edema, may be 20-30lbs above dry weight -last ECHO with improvement in EF from 40 to 55% and grade 2DD -NICM due to hypertension -see below    AKi on CKD4 -baseline creatinine around 2.5 -due to DM and hypertensive nephropathy and now with cardiorenal syndrome -continue lasix high dose IV lasix per Renal -Renal consult greatly appreciated -expect some worsening with diuresis -monitor weight, negative 2.2L today   Edema with fluid blisters -clinically do not suspect acute cellulitis -will hold off on further Abx  -no fevers, WBC improving    Paroxysmal atrial fibrillation (HCC) -with RVR on cardizem gtt -chadsvasc score is 5, on xarelto, may need to be switched to coumadin depending on how creatinine trends -rate improving, plan to change to PO cardizem today   HTN -BP soft now, hold hydralazine and imdur to allow more room for diuresis    Insulin-dependent diabetes mellitus with renal complications ([place]) -on lantus at home, now SSI -resume lantus at lower dose  DVT prophylaxis: xarelto Code Status: Full Code Family Communication: none at bedside Disposition Plan: Keep in SDU today  Consultants:   Cards  Renal   Subjective: Breathing better, weight down a little  Objective: Vitals:   12/04/16 0352 12/04/16 0748 12/04/16 0752 12/04/16 1122  BP:   (!) 115/94 97/76  Pulse:   (!) 28   Resp:   (!) 21 19  Temp: 98.1 F (36.7 C)     TempSrc: Oral     SpO2:   97% 95%  Weight:  (!) 144.2 kg (317 lb 14.5 oz)    Height:        Intake/Output  Summary (Last 24 hours) at 12/04/16 1405 Last data filed at 12/04/16 1107  Gross per 24 hour  Intake           836.67 ml  Output             2250 ml  Net         -1413.33 ml   Filed Weights   12/02/16 1348 12/02/16 2200 12/04/16 0748  Weight: (!) 140.6 kg (310 lb) (!) 145.2 kg (320 lb 1.7 oz) (!) 144.2 kg (317 lb 14.5 oz)    Examination:  Gen: Awake, Alert, Oriented X 3, obese male, no distress HEENT: PERRLA, Neck supple, no JVD Lungs: decreased BS at bases CVS: irregular Abd: soft, Non tender, non distended, BS present Extremities: 2-3plus edema Skin: no new rashes    Data Reviewed:   CBC:  Recent Labs Lab 12/02/16 1405 12/03/16 0359 12/04/16 0340  WBC 18.0* 21.3* 15.2*  NEUTROABS 14.7*  --   --   HGB 13.0 11.8* 11.8*  HCT 39.3 36.8* 36.6*  MCV 80.2 79.8 79.0  PLT 374 315 222   Basic Metabolic Panel:  Recent Labs Lab 12/02/16 1405 12/03/16 0359 12/04/16 0340  NA 131* 130* 129*  K 3.6 4.2 3.1*  CL 87* 85* 84*  CO2 29 29 29   GLUCOSE 233* 319* 297*  BUN 115* 128* 109*  CREATININE 3.12* 3.25* 3.34*  CALCIUM 8.8* 8.3* 8.2*  MG 1.7  --  1.5*   GFR: Estimated Creatinine Clearance: 41.1 mL/min (A) (by C-G formula based on SCr of 3.34 mg/dL (H)). Liver Function Tests:  Recent Labs Lab 12/02/16 1405 12/03/16 0359  AST 23 19  ALT 33 28  ALKPHOS 71 59  BILITOT 1.1 1.0  PROT 6.3* 5.7*  ALBUMIN 3.1* 2.6*   No results for input(s): LIPASE, AMYLASE in the last 168 hours. No results for input(s): AMMONIA in the last 168 hours. Coagulation Profile: No results for input(s): INR, PROTIME in the last 168 hours. Cardiac Enzymes: No results for input(s): CKTOTAL, CKMB, CKMBINDEX, TROPONINI in the last 168 hours. BNP (last 3 results) No results for input(s): PROBNP in the last 8760 hours. HbA1C:  Recent Labs  12/02/16 2043  HGBA1C 14.5*   CBG:  Recent Labs Lab 12/03/16 1224 12/03/16 1549 12/03/16 2141 12/04/16 0738 12/04/16 1220  GLUCAP 367*  353* 368* 224* 297*   Lipid Profile: No results for input(s): CHOL, HDL, LDLCALC, TRIG, CHOLHDL, LDLDIRECT in the last 72 hours. Thyroid Function Tests: No results for input(s): TSH, T4TOTAL, FREET4, T3FREE, THYROIDAB in the last 72 hours. Anemia Panel: No results for input(s): VITAMINB12, FOLATE, FERRITIN, TIBC, IRON, RETICCTPCT in the last 72 hours. Urine analysis:    Component Value Date/Time   COLORURINE YELLOW 12/02/2016 1518   APPEARANCEUR CLEAR 12/02/2016 1518   LABSPEC 1.008 12/02/2016 1518   PHURINE 5.0 12/02/2016 1518   GLUCOSEU NEGATIVE 12/02/2016 1518   HGBUR NEGATIVE 12/02/2016 1518   BILIRUBINUR NEGATIVE 12/02/2016 1518   KETONESUR NEGATIVE 12/02/2016 1518   PROTEINUR 30 (A) 12/02/2016 1518   NITRITE NEGATIVE 12/02/2016 1518   LEUKOCYTESUR NEGATIVE 12/02/2016 1518   Sepsis Labs: @LABRCNTIP (procalcitonin:4,lacticidven:4)  ) Recent Results (from the past 240 hour(s))  Culture, blood (Routine X 2) w Reflex to ID Panel     Status: None (Preliminary result)   Collection Time: 12/02/16  2:05 PM  Result Value Ref Range Status   Specimen Description BLOOD LEFT ANTECUBITAL  Final   Special Requests   Final    BOTTLES DRAWN AEROBIC AND ANAEROBIC Blood Culture results may not be optimal due to an inadequate volume of blood received in culture bottles   Culture   Final    NO GROWTH < 24 HOURS Performed at [facility], [place] [address], [place], [place] [address]    Report Status PENDING  Incomplete  Culture, blood (Routine X 2) w Reflex to ID Panel     Status: None (Preliminary result)   Collection Time: 12/02/16  2:07 PM  Result Value Ref Range Status   Specimen Description BLOOD RIGHT ANTECUBITAL  Final   Special Requests   Final    BOTTLES DRAWN AEROBIC AND ANAEROBIC Blood Culture results may not be optimal due to an inadequate volume of blood received in culture bottles   Culture   Final    NO GROWTH < 24 HOURS Performed at [facility], [place]  [address], [place], [place] [address]    Report Status PENDING  Incomplete  MRSA PCR Screening     Status: None   Collection Time: 12/02/16 10:01 PM  Result Value Ref Range Status   MRSA by PCR NEGATIVE NEGATIVE Final    Comment:        The GeneXpert MRSA Assay (FDA approved for NASAL specimens only), is one component of a comprehensive MRSA colonization surveillance program. It is not intended to diagnose MRSA infection nor to guide or monitor treatment for MRSA infections.  Radiology Studies: Dg Chest Port 1 View  Result Date: 12/02/2016 CLINICAL DATA:  Shortness of Breath EXAM: PORTABLE CHEST 1 VIEW COMPARISON:  November 22, 2016 FINDINGS: There is no edema or consolidation. Heart is enlarged but stable. Pulmonary vascularity is normal. No adenopathy. No bone lesions. IMPRESSION: Stable cardiomegaly.  No edema or consolidation. Electronically Signed   By: Lowella Grip III M.D.   On: 12/02/2016 16:02        Scheduled Meds: . carvedilol  25 mg Oral BID WC  . colchicine  0.6 mg Oral BID  . diltiazem  240 mg Oral Daily  . gabapentin  300 mg Oral TID  . insulin aspart  5 Units Subcutaneous TID WC  . insulin glargine  10 Units Subcutaneous Q2200  . metolazone  5 mg Oral Daily  . potassium chloride  20 mEq Oral Daily  . rivaroxaban  20 mg Oral Q supper  . sodium chloride flush  3 mL Intravenous Q12H   Continuous Infusions: . sodium chloride    . furosemide Stopped (12/04/16 1207)  . sodium chloride    . sodium chloride       LOS: 2 days    Time spent: 26min    Domenic Polite, MD [facility] Pager [phone]  If 7PM-7AM, please contact night-coverage [email or web address] Password [email or web address] 12/04/2016, 2:05 PM

## 2016-12-04 NOTE — Progress Notes (Signed)
[facility] Progress Note  Subjective: no c/o. UOP OK 1.3 L out.   Vitals:   12/04/16 0300 12/04/16 0352 12/04/16 0748 12/04/16 0752  BP: 104/86   (!) 115/94  Pulse:    (!) 28  Resp:    (!) 21  Temp:  98.1 F (36.7 C)    TempSrc:  Oral    SpO2:    97%  Weight:   (!) 144.2 kg (317 lb 14.5 oz)   Height:        Inpatient medications: . carvedilol  25 mg Oral BID WC  . colchicine  0.6 mg Oral BID  . diltiazem  240 mg Oral Daily  . gabapentin  300 mg Oral TID  . insulin aspart  5 Units Subcutaneous TID WC  . insulin glargine  10 Units Subcutaneous Q2200  . metolazone  5 mg Oral Daily  . potassium chloride  20 mEq Oral Daily  . rivaroxaban  20 mg Oral Q supper  . sodium chloride flush  3 mL Intravenous Q12H   . sodium chloride    . furosemide Stopped (12/04/16 0735)  . sodium chloride    . sodium chloride     sodium chloride, acetaminophen **OR** acetaminophen, albuterol, levalbuterol, ondansetron **OR** ondansetron (ZOFRAN) IV, oxyCODONE, sodium chloride flush  Exam: Gen obese, no distress, calm JVD to angle of jaw Chest rales R base, L clear RRR no MRG Abd soft ntnd no mass or ascites +bs obese GU normal male MS no joint effusions or deformity Ext 2-3+ LE edema , L > R Neuro is alert, Ox 3 , nf   Home meds: -lasix 160 bid/ coreg 25 bid/ hydralazine 50 bid/ zaroxolyn 5 qd -xarelto -KCl/ isordil 10 bid/ colace/ flonase/ insulin novolog/ colchicine/ neurontin 300 tid/  -pred 10 qd/ MVI / Duexis (nsaid/ H2B combo) -prns = tylenol/ lactulose/ percocet/ albuterol/   CXR - vasc congestion    Impression: 1.  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  Cont IV lasix, ^po zarox 2.  Vol overload / anasarca - will change lasix to 160mg  every 6 hrs bolus IV and ^zaroxolyn to 10/ d.   3.  NICM - EF 35% 4.  HTN - BP's on lower side, getting po coreg and now PO and IV dilt for afib 5.  DM2 on insulin 6.  AFib w RVR -  as above   Plan - as above   Kelly Splinter MD [facility] pager [phone]   12/04/2016, 9:13 AM    Recent Labs Lab 12/02/16 1405 12/03/16 0359 12/04/16 0340  NA 131* 130* 129*  K 3.6 4.2 3.1*  CL 87* 85* 84*  CO2 29 29 29   GLUCOSE 233* 319* 297*  BUN 115* 128* 109*  CREATININE 3.12* 3.25* 3.34*  CALCIUM 8.8* 8.3* 8.2*    Recent Labs Lab 12/02/16 1405 12/03/16 0359  AST 23 19  ALT 33 28  ALKPHOS 71 59  BILITOT 1.1 1.0  PROT 6.3* 5.7*  ALBUMIN 3.1* 2.6*    Recent Labs Lab 12/02/16 1405 12/03/16 0359 12/04/16 0340  WBC 18.0* 21.3* 15.2*  NEUTROABS 14.7*  --   --   HGB 13.0 11.8* 11.8*  HCT 39.3 36.8* 36.6*  MCV 80.2 79.8 79.0  PLT 374 315 333   Iron/TIBC/Ferritin/ %Sat No results found for: IRON, TIBC, FERRITIN, IRONPCTSAT

## 2016-12-04 NOTE — Progress Notes (Signed)
Pt had 20 beats of wide complex tachycardia.  BP stable.  No chest pain.  No complaints from patient.  Audiological scientist notified.  Mag added to morning labs.

## 2016-12-05 LAB — GLUCOSE, CAPILLARY
GLUCOSE-CAPILLARY: 254 mg/dL — AB (ref 65–99)
GLUCOSE-CAPILLARY: 294 mg/dL — AB (ref 65–99)
Glucose-Capillary: 208 mg/dL — ABNORMAL HIGH (ref 65–99)
Glucose-Capillary: 180 mg/dL — ABNORMAL HIGH (ref 65–99)

## 2016-12-05 LAB — CBC
HEMATOCRIT: 38.8 % — AB (ref 39.0–52.0)
HEMOGLOBIN: 12.4 g/dL — AB (ref 13.0–17.0)
MCH: 25.8 pg — AB (ref 26.0–34.0)
MCHC: 32 g/dL (ref 30.0–36.0)
MCV: 80.7 fL (ref 78.0–100.0)
Platelets: 382 10*3/uL (ref 150–400)
RBC: 4.81 MIL/uL (ref 4.22–5.81)
RDW: 16.2 % — ABNORMAL HIGH (ref 11.5–15.5)
WBC: 17.3 10*3/uL — ABNORMAL HIGH (ref 4.0–10.5)

## 2016-12-05 LAB — BASIC METABOLIC PANEL
ANION GAP: 16 — AB (ref 5–15)
BUN: 119 mg/dL — ABNORMAL HIGH (ref 6–20)
CALCIUM: 8.2 mg/dL — AB (ref 8.9–10.3)
CHLORIDE: 82 mmol/L — AB (ref 101–111)
CO2: 32 mmol/L (ref 22–32)
Creatinine, Ser: 3.28 mg/dL — ABNORMAL HIGH (ref 0.61–1.24)
GFR calc non Af Amer: 21 mL/min — ABNORMAL LOW (ref >60)
GFR, EST AFRICAN AMERICAN: 25 mL/min — AB (ref >60)
Glucose, Bld: 271 mg/dL — ABNORMAL HIGH (ref 65–99)
Potassium: 3.8 mmol/L (ref 3.5–5.1)
SODIUM: 130 mmol/L — AB (ref 135–145)

## 2016-12-05 MED ORDER — DILTIAZEM HCL 60 MG PO TABS
60.0000 mg | ORAL_TABLET | Freq: Once | ORAL | Status: AC
  Administered 2016-12-05: 60 mg via ORAL
  Filled 2016-12-05: qty 1

## 2016-12-05 MED ORDER — FUROSEMIDE 10 MG/ML IJ SOLN
160.0000 mg | Freq: Two times a day (BID) | INTRAVENOUS | Status: DC
  Administered 2016-12-06 – 2016-12-08 (×5): 160 mg via INTRAVENOUS
  Filled 2016-12-05: qty 10
  Filled 2016-12-05: qty 16
  Filled 2016-12-05: qty 10
  Filled 2016-12-05: qty 2
  Filled 2016-12-05 (×2): qty 10

## 2016-12-05 MED ORDER — DILTIAZEM HCL ER COATED BEADS 180 MG PO CP24
300.0000 mg | ORAL_CAPSULE | Freq: Every day | ORAL | Status: DC
  Administered 2016-12-06 – 2016-12-10 (×5): 300 mg via ORAL
  Filled 2016-12-05 (×5): qty 1

## 2016-12-05 MED ORDER — INSULIN GLARGINE 100 UNIT/ML ~~LOC~~ SOLN
25.0000 [IU] | Freq: Every day | SUBCUTANEOUS | Status: DC
  Administered 2016-12-05: 25 [IU] via SUBCUTANEOUS
  Filled 2016-12-05: qty 0.25

## 2016-12-05 MED ORDER — DOXYCYCLINE HYCLATE 100 MG IV SOLR
100.0000 mg | Freq: Two times a day (BID) | INTRAVENOUS | Status: DC
  Administered 2016-12-05 – 2016-12-11 (×13): 100 mg via INTRAVENOUS
  Filled 2016-12-05 (×16): qty 100

## 2016-12-05 NOTE — Progress Notes (Signed)
Progress Note  Patient Name: John Bailey Date of Encounter: 12/05/2016  Primary Cardiologist: Hilty   Subjective   46 y.o.malewith a hx of diabetes, hypertension, atrial fibrillation on Xarelto, chronic systolic and diastolic CHF,  CKD4 admitted with severe vol overload, AKI and Afib RVR  His I/O are -2.9 liters so far this Admission. ( not accurate due to incontenence )   Inpatient Medications    Scheduled Meds: . carvedilol  25 mg Oral BID WC  . colchicine  0.6 mg Oral BID  . diltiazem  240 mg Oral Daily  . gabapentin  300 mg Oral TID  . insulin aspart  5 Units Subcutaneous TID WC  . insulin glargine  15 Units Subcutaneous QHS  . metolazone  5 mg Oral Daily  . potassium chloride  20 mEq Oral Daily  . rivaroxaban  20 mg Oral Q supper  . sodium chloride flush  3 mL Intravenous Q12H   Continuous Infusions: . sodium chloride    . doxycycline (VIBRAMYCIN) IV 100 mg (12/05/16 0814)  . furosemide Stopped (12/05/16 0433)  . sodium chloride    . sodium chloride     PRN Meds: sodium chloride, acetaminophen **OR** acetaminophen, albuterol, levalbuterol, ondansetron **OR** ondansetron (ZOFRAN) IV, oxyCODONE, sodium chloride flush   Vital Signs    Vitals:   12/04/16 2344 12/05/16 0330 12/05/16 0403 12/05/16 0711  BP:  (!) 141/100    Pulse:      Resp:  (!) 23    Temp: 97.8 F (36.6 C)  98.4 F (36.9 C) (!) 96.7 F (35.9 C)  TempSrc: Axillary  Oral Axillary  SpO2:  95%    Weight:    (!) 314 lb 2.5 oz (142.5 kg)  Height:        Intake/Output Summary (Last 24 hours) at 12/05/16 0920 Last data filed at 12/05/16 0433  Gross per 24 hour  Intake              678 ml  Output             1676 ml  Net             -998 ml   Filed Weights   12/02/16 2200 12/04/16 0748 12/05/16 0711  Weight: (!) 320 lb 1.7 oz (145.2 kg) (!) 317 lb 14.5 oz (144.2 kg) (!) 314 lb 2.5 oz (142.5 kg)    Telemetry    Atrial fib  - Personally Reviewed  ECG     Atrial fib  - Personally  Reviewed  Physical Exam   GEN: morbidly obese male, NAD  Neck: No JVD Cardiac: Irreg. Irreg. ,   Respiratory: Clear to auscultation bilaterally. GI: Soft, nontender, non-distended  MS:  2-3+ pitting edema  Neuro:  Nonfocal  Psych: Normal affect   Labs    Chemistry Recent Labs Lab 12/02/16 1405 12/03/16 0359 12/04/16 0340 12/05/16 0321  NA 131* 130* 129* 130*  K 3.6 4.2 3.1* 3.8  CL 87* 85* 84* 82*  CO2 29 29 29  32  GLUCOSE 233* 319* 297* 271*  BUN 115* 128* 109* 119*  CREATININE 3.12* 3.25* 3.34* 3.28*  CALCIUM 8.8* 8.3* 8.2* 8.2*  PROT 6.3* 5.7*  --   --   ALBUMIN 3.1* 2.6*  --   --   AST 23 19  --   --   ALT 33 28  --   --   ALKPHOS 71 59  --   --   BILITOT 1.1 1.0  --   --  GFRNONAA 23* 22* 21* 21*  GFRAA 26* 25* 24* 25*  ANIONGAP 15 16* 16* 16*     Hematology Recent Labs Lab 12/03/16 0359 12/04/16 0340 12/05/16 0321  WBC 21.3* 15.2* 17.3*  RBC 4.61 4.63 4.81  HGB 11.8* 11.8* 12.4*  HCT 36.8* 36.6* 38.8*  MCV 79.8 79.0 80.7  MCH 25.6* 25.5* 25.8*  MCHC 32.1 32.2 32.0  RDW 16.4* 16.2* 16.2*  PLT 315 333 382    Cardiac EnzymesNo results for input(s): TROPONINI in the last 168 hours.  Recent Labs Lab 12/02/16 1416  TROPIPOC 0.26*     BNP Recent Labs Lab 12/02/16 1405  BNP 554.8*     DDimer No results for input(s): DDIMER in the last 168 hours.   Radiology    No results found.  Cardiac Studies      Patient Profile     46 y.o. male with acute on chronic diastolic congestive heart failure and chronic atrial fibrillation.  Assessment & Plan    1. Acute on chronic diastolic congestive heart failure: John Bailey is making progress. His weight is steadily decreasing. He had several episodes of incontinence so his ins and outs are not accurate.  We have stressed the importance of continued medication compliance. He needs to work better on his diet.  2. Atrial fibrillation: His rate is fairly well-controlled.  Will increase the Cardizem  to 300 mg a day.  This will also help with his hypertension.  3. Diabetes mellitus plans per medical team. He's been generally noncompliant with his diabetic care.  For questions or updates, please contact [place] Please consult [email or web address] for contact info under Cardiology/STEMI.      Signed, Mertie Moores, MD  12/05/2016, 9:20 AM

## 2016-12-05 NOTE — Progress Notes (Signed)
PROGRESS NOTE    John Bailey  XBD:[id number] DOB: Aug 06, 1970 DOA: 12/02/2016 PCP: [name], MD  Brief Narrative:46 y.o. male with a hx of diabetes, hypertension, atrial fibrillation on Xarelto, chronic systolic and diastolic CHF,  CKD4  admitted with severe vol overload, AKI and Afib RVR  Assessment & Plan:    Acute combined systolic (congestive) and diastolic (congestive) heart failure ([place]) -with profound edema, may be 20-30lbs above dry weight -last ECHO with improvement in EF from 40 to 55% and grade 2DD -NICM due to hypertension -see below    AKi on CKD4 -baseline creatinine around 2.5 -due to DM and hypertensive nephropathy and now with cardiorenal syndrome -continue lasix high dose IV lasix per Renal -Renal consult greatly appreciated -expect some worsening with diuresis -monitor weight, negative 3.3L today  Secondary LLE cellulitis -clinically suspect this today  -add IV doxy today -monitor    Paroxysmal atrial fibrillation ([place]) -with RVR on cardizem gtt -chadsvasc score is 5, on xarelto, may need to be switched to coumadin depending on how creatinine trends -rate improving, plan to changed to PO cardizem  -HR better   HTN -BP soft now, held hydralazine and imdur to allow more room for diuresis    Insulin-dependent diabetes mellitus with renal complications ([place]) -on lantus at home, now SSI -increase lantus dose  DVT prophylaxis: xarelto Code Status: Full Code Family Communication: none at bedside Disposition Plan: TXto tele  Consultants:   Cards  Renal   Subjective: Continues to feel better overall, LLE more pain and tenderness  Objective: Vitals:   12/05/16 0711 12/05/16 0800 12/05/16 1125 12/05/16 1200  BP:  (!) 158/102  (!) 144/88  Pulse:      Resp:  15  (!) 26  Temp: (!) 96.7 F (35.9 C)  97.8 F (36.6 C)   TempSrc: Axillary  Oral   SpO2:    96%  Weight: (!) 142.5 kg (314 lb 2.5 oz)     Height:        Intake/Output  Summary (Last 24 hours) at 12/05/16 1416 Last data filed at 12/05/16 1125  Gross per 24 hour  Intake             1048 ml  Output             2351 ml  Net            -1303 ml   Filed Weights   12/02/16 2200 12/04/16 0748 12/05/16 0711  Weight: (!) 145.2 kg (320 lb 1.7 oz) (!) 144.2 kg (317 lb 14.5 oz) (!) 142.5 kg (314 lb 2.5 oz)    Examination:  Gen: Awake, Alert, Oriented X 3, obese male, no distress HEENT: PERRLA, Neck supple, no JVD Lungs: decreased BS at bases CVS: irregular Abd: soft, Non tender, non distended, BS present Extremities: 2plus edema, LLE with erythema and tenderness too Skin: no new rashes    Data Reviewed:   CBC:  Recent Labs Lab 12/02/16 1405 12/03/16 0359 12/04/16 0340 12/05/16 0321  WBC 18.0* 21.3* 15.2* 17.3*  NEUTROABS 14.7*  --   --   --   HGB 13.0 11.8* 11.8* 12.4*  HCT 39.3 36.8* 36.6* 38.8*  MCV 80.2 79.8 79.0 80.7  PLT 374 315 333 834   Basic Metabolic Panel:  Recent Labs Lab 12/02/16 1405 12/03/16 0359 12/04/16 0340 12/05/16 0321  NA 131* 130* 129* 130*  K 3.6 4.2 3.1* 3.8  CL 87* 85* 84* 82*  CO2 29 29 29  32  GLUCOSE 233* 319* 297* 271*  BUN 115* 128* 109* 119*  CREATININE 3.12* 3.25* 3.34* 3.28*  CALCIUM 8.8* 8.3* 8.2* 8.2*  MG 1.7  --  1.5*  --    GFR: Estimated Creatinine Clearance: 41.5 mL/min (A) (by C-G formula based on SCr of 3.28 mg/dL (H)). Liver Function Tests:  Recent Labs Lab 12/02/16 1405 12/03/16 0359  AST 23 19  ALT 33 28  ALKPHOS 71 59  BILITOT 1.1 1.0  PROT 6.3* 5.7*  ALBUMIN 3.1* 2.6*   No results for input(s): LIPASE, AMYLASE in the last 168 hours. No results for input(s): AMMONIA in the last 168 hours. Coagulation Profile: No results for input(s): INR, PROTIME in the last 168 hours. Cardiac Enzymes: No results for input(s): CKTOTAL, CKMB, CKMBINDEX, TROPONINI in the last 168 hours. BNP (last 3 results) No results for input(s): PROBNP in the last 8760 hours. HbA1C:  Recent Labs   12/02/16 2043  HGBA1C 14.5*   CBG:  Recent Labs Lab 12/04/16 1220 12/04/16 1617 12/04/16 2202 12/05/16 0740 12/05/16 1125  GLUCAP 297* 411* 354* 254* 208*   Lipid Profile: No results for input(s): CHOL, HDL, LDLCALC, TRIG, CHOLHDL, LDLDIRECT in the last 72 hours. Thyroid Function Tests: No results for input(s): TSH, T4TOTAL, FREET4, T3FREE, THYROIDAB in the last 72 hours. Anemia Panel: No results for input(s): VITAMINB12, FOLATE, FERRITIN, TIBC, IRON, RETICCTPCT in the last 72 hours. Urine analysis:    Component Value Date/Time   COLORURINE YELLOW 12/02/2016 1518   APPEARANCEUR CLEAR 12/02/2016 1518   LABSPEC 1.008 12/02/2016 1518   PHURINE 5.0 12/02/2016 1518   GLUCOSEU NEGATIVE 12/02/2016 1518   HGBUR NEGATIVE 12/02/2016 1518   BILIRUBINUR NEGATIVE 12/02/2016 1518   KETONESUR NEGATIVE 12/02/2016 1518   PROTEINUR 30 (A) 12/02/2016 1518   NITRITE NEGATIVE 12/02/2016 1518   LEUKOCYTESUR NEGATIVE 12/02/2016 1518   Sepsis Labs: @LABRCNTIP (procalcitonin:4,lacticidven:4)  ) Recent Results (from the past 240 hour(s))  Culture, blood (Routine X 2) w Reflex to ID Panel     Status: None (Preliminary result)   Collection Time: 12/02/16  2:05 PM  Result Value Ref Range Status   Specimen Description BLOOD LEFT ANTECUBITAL  Final   Special Requests   Final    BOTTLES DRAWN AEROBIC AND ANAEROBIC Blood Culture results may not be optimal due to an inadequate volume of blood received in culture bottles   Culture   Final    NO GROWTH 3 DAYS Performed at [facility], [place] [address], [place], [place] [address]    Report Status PENDING  Incomplete  Culture, blood (Routine X 2) w Reflex to ID Panel     Status: None (Preliminary result)   Collection Time: 12/02/16  2:07 PM  Result Value Ref Range Status   Specimen Description BLOOD RIGHT ANTECUBITAL  Final   Special Requests   Final    BOTTLES DRAWN AEROBIC AND ANAEROBIC Blood Culture results may not be optimal due to an  inadequate volume of blood received in culture bottles   Culture   Final    NO GROWTH 3 DAYS Performed at [facility], [place] [address], [place],  [address]    Report Status PENDING  Incomplete  MRSA PCR Screening     Status: None   Collection Time: 12/02/16 10:01 PM  Result Value Ref Range Status   MRSA by PCR NEGATIVE NEGATIVE Final    Comment:        The GeneXpert MRSA Assay (FDA approved for NASAL specimens only),  is one component of a comprehensive MRSA colonization surveillance program. It is not intended to diagnose MRSA infection nor to guide or monitor treatment for MRSA infections.          Radiology Studies: No results found.      Scheduled Meds: . carvedilol  25 mg Oral BID WC  . colchicine  0.6 mg Oral BID  . [START ON 12/06/2016] diltiazem  300 mg Oral Daily  . gabapentin  300 mg Oral TID  . insulin aspart  5 Units Subcutaneous TID WC  . insulin glargine  15 Units Subcutaneous QHS  . metolazone  5 mg Oral Daily  . potassium chloride  20 mEq Oral Daily  . rivaroxaban  20 mg Oral Q supper  . sodium chloride flush  3 mL Intravenous Q12H   Continuous Infusions: . sodium chloride    . doxycycline (VIBRAMYCIN) IV Stopped (12/05/16 1014)  . furosemide Stopped (12/05/16 1056)  . sodium chloride    . sodium chloride       LOS: 3 days    Time spent: 19min    Domenic Polite, MD [facility] Pager [phone]  If 7PM-7AM, please contact night-coverage [email or web address] Password [email or web address] 12/05/2016, 2:16 PM

## 2016-12-05 NOTE — Progress Notes (Signed)
[facility] Progress Note  Subjective: no c/o. UOP OK 1.3 L out.   Vitals:   12/05/16 0800 12/05/16 1125 12/05/16 1200 12/05/16 1559  BP: (!) 158/102  (!) 144/88 (!) 88/58  Pulse:      Resp: 15  (!) 26 (!) 22  Temp:  97.8 F (36.6 C)  97.6 F (36.4 C)  TempSrc:  Oral  Oral  SpO2:   96% 97%  Weight:      Height:        Inpatient medications: . carvedilol  25 mg Oral BID WC  . colchicine  0.6 mg Oral BID  . [START ON 12/06/2016] diltiazem  300 mg Oral Daily  . gabapentin  300 mg Oral TID  . insulin aspart  5 Units Subcutaneous TID WC  . insulin glargine  25 Units Subcutaneous QHS  . metolazone  5 mg Oral Daily  . potassium chloride  20 mEq Oral Daily  . rivaroxaban  20 mg Oral Q supper  . sodium chloride flush  3 mL Intravenous Q12H   . sodium chloride    . doxycycline (VIBRAMYCIN) IV Stopped (12/05/16 1014)  . [START ON 12/06/2016] furosemide    . sodium chloride    . sodium chloride     sodium chloride, acetaminophen **OR** acetaminophen, albuterol, levalbuterol, ondansetron **OR** ondansetron (ZOFRAN) IV, oxyCODONE, sodium chloride flush  Exam: Gen obese, no distress, calm +JVD Chest clear bilat RRR no MRG Abd soft ntnd no mass or ascites +bs obese GU normal male MS no joint effusions or deformity Ext 2+ bilat  LE edema , L > R Neuro is alert, Ox 3 , nf   Home meds: -lasix 160 bid/ coreg 25 bid/ hydralazine 50 bid/ zaroxolyn 5 qd -xarelto -KCl/ isordil 10 bid/ colace/ flonase/ insulin novolog/ colchicine/ neurontin 300 tid -pred 10 qd/ MVI / Duexis (nsaid/ H2B) -prns = tylenol/ lactulose/ percocet/ albuterol/   CXR 9/19 - vasc congestion    Impression: 1.  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 Cr ^ due to decomp HF vs other.  Cont IV lasix, po zaroxolyn. Serum CO2 up at 32, will decrease lasix to every 12h.    2.  Vol overload / anasarca - as above  3.  NICM - EF 35% 4.  HTN - bp's higher today, metop dc'd, on po dilt 5.  DM2 on  insulin 6.  AFib w RVR - as above   Plan - as above   Kelly Splinter MD [facility] pager [phone]   12/05/2016, 7:12 PM    Recent Labs Lab 12/03/16 0359 12/04/16 0340 12/05/16 0321  NA 130* 129* 130*  K 4.2 3.1* 3.8  CL 85* 84* 82*  CO2 29 29 32  GLUCOSE 319* 297* 271*  BUN 128* 109* 119*  CREATININE 3.25* 3.34* 3.28*  CALCIUM 8.3* 8.2* 8.2*    Recent Labs Lab 12/02/16 1405 12/03/16 0359  AST 23 19  ALT 33 28  ALKPHOS 71 59  BILITOT 1.1 1.0  PROT 6.3* 5.7*  ALBUMIN 3.1* 2.6*    Recent Labs Lab 12/02/16 1405 12/03/16 0359 12/04/16 0340 12/05/16 0321  WBC 18.0* 21.3* 15.2* 17.3*  NEUTROABS 14.7*  --   --   --   HGB 13.0 11.8* 11.8* 12.4*  HCT 39.3 36.8* 36.6* 38.8*  MCV 80.2 79.8 79.0 80.7  PLT 374 315 333 382   Iron/TIBC/Ferritin/ %Sat No results found for: IRON, TIBC, FERRITIN, IRONPCTSAT

## 2016-12-06 DIAGNOSIS — I5043 Acute on chronic combined systolic (congestive) and diastolic (congestive) heart failure: Secondary | ICD-10-CM

## 2016-12-06 LAB — GLUCOSE, CAPILLARY
Glucose-Capillary: 302 mg/dL — ABNORMAL HIGH (ref 65–99)
Glucose-Capillary: 175 mg/dL — ABNORMAL HIGH (ref 65–99)
Glucose-Capillary: 154 mg/dL — ABNORMAL HIGH (ref 65–99)
Glucose-Capillary: 219 mg/dL — ABNORMAL HIGH (ref 65–99)

## 2016-12-06 LAB — BASIC METABOLIC PANEL
Anion gap: 13 (ref 5–15)
BUN: 112 mg/dL — ABNORMAL HIGH (ref 6–20)
CHLORIDE: 83 mmol/L — AB (ref 101–111)
CO2: 31 mmol/L (ref 22–32)
CREATININE: 3.32 mg/dL — AB (ref 0.61–1.24)
Calcium: 7.7 mg/dL — ABNORMAL LOW (ref 8.9–10.3)
GFR calc non Af Amer: 21 mL/min — ABNORMAL LOW (ref >60)
GFR, EST AFRICAN AMERICAN: 24 mL/min — AB (ref >60)
Glucose, Bld: 347 mg/dL — ABNORMAL HIGH (ref 65–99)
POTASSIUM: 3.2 mmol/L — AB (ref 3.5–5.1)
SODIUM: 127 mmol/L — AB (ref 135–145)

## 2016-12-06 LAB — CBC
HCT: 34.9 % — ABNORMAL LOW (ref 39.0–52.0)
HEMOGLOBIN: 11.3 g/dL — AB (ref 13.0–17.0)
MCH: 25.9 pg — ABNORMAL LOW (ref 26.0–34.0)
MCHC: 32.4 g/dL (ref 30.0–36.0)
MCV: 80 fL (ref 78.0–100.0)
Platelets: 332 10*3/uL (ref 150–400)
RBC: 4.36 MIL/uL (ref 4.22–5.81)
RDW: 16.2 % — ABNORMAL HIGH (ref 11.5–15.5)
WBC: 14.1 10*3/uL — AB (ref 4.0–10.5)

## 2016-12-06 MED ORDER — INSULIN ASPART 100 UNIT/ML ~~LOC~~ SOLN
6.0000 [IU] | Freq: Three times a day (TID) | SUBCUTANEOUS | Status: DC
  Administered 2016-12-06 – 2016-12-16 (×22): 6 [IU] via SUBCUTANEOUS

## 2016-12-06 MED ORDER — INSULIN ASPART 100 UNIT/ML ~~LOC~~ SOLN
8.0000 [IU] | Freq: Three times a day (TID) | SUBCUTANEOUS | Status: DC
Start: 2016-12-06 — End: 2016-12-06

## 2016-12-06 MED ORDER — POTASSIUM CHLORIDE CRYS ER 20 MEQ PO TBCR
40.0000 meq | EXTENDED_RELEASE_TABLET | Freq: Two times a day (BID) | ORAL | Status: DC
  Administered 2016-12-06: 40 meq via ORAL
  Filled 2016-12-06: qty 2

## 2016-12-06 MED ORDER — POTASSIUM CHLORIDE CRYS ER 20 MEQ PO TBCR: 40.0000 meq | EXTENDED_RELEASE_TABLET | Freq: Two times a day (BID) | ORAL | Status: DC

## 2016-12-06 MED ORDER — POTASSIUM CHLORIDE CRYS ER 20 MEQ PO TBCR
40.0000 meq | EXTENDED_RELEASE_TABLET | Freq: Three times a day (TID) | ORAL | Status: AC
  Administered 2016-12-06 (×2): 40 meq via ORAL
  Filled 2016-12-06 (×2): qty 2

## 2016-12-06 MED ORDER — INSULIN ASPART 100 UNIT/ML ~~LOC~~ SOLN
0.0000 [IU] | Freq: Three times a day (TID) | SUBCUTANEOUS | Status: DC
  Administered 2016-12-06: 2 [IU] via SUBCUTANEOUS
  Administered 2016-12-06: 9 [IU] via SUBCUTANEOUS
  Administered 2016-12-06: 2 [IU] via SUBCUTANEOUS
  Administered 2016-12-07 (×2): 3 [IU] via SUBCUTANEOUS
  Administered 2016-12-07 – 2016-12-08 (×3): 2 [IU] via SUBCUTANEOUS
  Administered 2016-12-08: 3 [IU] via SUBCUTANEOUS
  Administered 2016-12-12: 2 [IU] via SUBCUTANEOUS
  Administered 2016-12-12: 1 [IU] via SUBCUTANEOUS
  Administered 2016-12-13: 2 [IU] via SUBCUTANEOUS
  Administered 2016-12-15: 1 [IU] via SUBCUTANEOUS
  Administered 2016-12-16: 2 [IU] via SUBCUTANEOUS

## 2016-12-06 MED ORDER — INSULIN GLARGINE 100 UNIT/ML ~~LOC~~ SOLN
35.0000 [IU] | Freq: Every day | SUBCUTANEOUS | Status: DC
  Administered 2016-12-06 – 2016-12-08 (×3): 35 [IU] via SUBCUTANEOUS
  Filled 2016-12-06 (×3): qty 0.35

## 2016-12-06 NOTE — Progress Notes (Signed)
[facility] Progress Note  Subjective: UOP better, 2.6 L out yest, legs still swollen   Vitals:   12/06/16 1200 12/06/16 1600 12/06/16 1945 12/06/16 2020  BP: 118/87 124/87 (!) 141/82   Pulse:      Resp: (!) 21 (!) 23 20   Temp: 97.9 F (36.6 C) 97.7 F (36.5 C)  98.6 F (37 C)  TempSrc: Axillary Oral  Axillary  SpO2: 97%  99%   Weight:      Height:        Inpatient medications: . carvedilol  25 mg Oral BID WC  . colchicine  0.6 mg Oral BID  . diltiazem  300 mg Oral Daily  . gabapentin  300 mg Oral TID  . insulin aspart  0-9 Units Subcutaneous TID WC  . insulin aspart  6 Units Subcutaneous TID WC  . insulin glargine  35 Units Subcutaneous QHS  . metolazone  5 mg Oral Daily  . potassium chloride  40 mEq Oral TID  . rivaroxaban  20 mg Oral Q supper  . sodium chloride flush  3 mL Intravenous Q12H   . sodium chloride    . doxycycline (VIBRAMYCIN) IV Stopped (12/06/16 2137)  . furosemide Stopped (12/06/16 1800)  . sodium chloride    . sodium chloride     sodium chloride, acetaminophen **OR** acetaminophen, albuterol, levalbuterol, ondansetron **OR** ondansetron (ZOFRAN) IV, oxyCODONE, sodium chloride flush  Exam: Gen obese, no distress, calm +JVD Chest clear bilat RRR no MRG Abd soft ntnd no mass or ascites +bs obese GU normal male MS no joint effusions or deformity Ext 2-3+ bilat  LE edema Neuro is alert, Ox 3 , nf   Home meds: -lasix 160 bid/ coreg 25 bid/ hydralazine 50 bid/ zaroxolyn 5 qd -xarelto -KCl/ isordil 10 bid/ colace/ flonase/ insulin novolog/ colchicine/ neurontin 300 tid -pred 10 qd/ MVI / Duexis (nsaid/ H2B) -prns = tylenol/ lactulose/ percocet/ albuterol/   CXR 9/19 - vasc congestion    Impression: 1.  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 Creat up 3.3 due to decomp HF vs other. Not diuresing as well as I was hoping for his degree of edema.  Cont IV lasix, po zaroxolyn.  2.  Vol overload / anasarca - as above  3.  NICM - EF  35% 4.  HTN - bp's good, on po dilt and coreg 5.  DM2 on insulin 6.  AFib w RVR - as above   Plan - as above   Kelly Splinter MD [facility] pager [phone]   12/06/2016, 9:34 PM    Recent Labs Lab 12/04/16 0340 12/05/16 0321 12/06/16 0334  NA 129* 130* 127*  K 3.1* 3.8 3.2*  CL 84* 82* 83*  CO2 29 32 31  GLUCOSE 297* 271* 347*  BUN 109* 119* 112*  CREATININE 3.34* 3.28* 3.32*  CALCIUM 8.2* 8.2* 7.7*    Recent Labs Lab 12/02/16 1405 12/03/16 0359  AST 23 19  ALT 33 28  ALKPHOS 71 59  BILITOT 1.1 1.0  PROT 6.3* 5.7*  ALBUMIN 3.1* 2.6*    Recent Labs Lab 12/02/16 1405  12/04/16 0340 12/05/16 0321 12/06/16 0334  WBC 18.0*  < > 15.2* 17.3* 14.1*  NEUTROABS 14.7*  --   --   --   --   HGB 13.0  < > 11.8* 12.4* 11.3*  HCT 39.3  < > 36.6* 38.8* 34.9*  MCV 80.2  < > 79.0 80.7 80.0  PLT 374  < > 333  382 332  < > = values in this interval not displayed. Iron/TIBC/Ferritin/ %Sat No results found for: IRON, TIBC, FERRITIN, IRONPCTSAT

## 2016-12-06 NOTE — Progress Notes (Signed)
PROGRESS NOTE    STAFFORD RIVIERA  IWL:[id number] DOB: [birth year] DOA: 12/02/2016 PCP: [name], MD  Brief Narrative:46 y.o. male with a hx of diabetes, hypertension, atrial fibrillation on Xarelto, chronic systolic and diastolic CHF,  CKD4  admitted with severe vol overload, AKI and Afib RVR  Assessment & Plan:    Acute combined systolic (congestive) and diastolic (congestive) heart failure ([place]) -with profound edema, felt to be 20-30lbs above dry weight -last ECHO with improvement in EF from 40 to 55% and grade 2DD -NICM due to hypertension -see below    AKi on CKD4 -baseline creatinine around 2.5 -due to DM and hypertensive nephropathy and now with cardiorenal syndrome -continue lasix high dose IV lasix per Renal -Renal consult greatly appreciated -creatinine stable at 3.3 -weight, negative 4.7L  Secondary LLE cellulitis -improving, continue DOxy Day 2 -monitor    Paroxysmal atrial fibrillation ([place]) -with RVR on cardizem gtt -chadsvasc score is 5, on xarelto, may need to be switched to coumadin depending on how creatinine trends -rate improving, changed to PO cardizem  -HR better   HTN -BP soft now, held hydralazine and imdur to allow more room for diuresis    Insulin-dependent diabetes mellitus with renal complications ([place]) -on lantus at home, now SSI -increase lantus dose and add SSI, novolog with meals  DVT prophylaxis: xarelto Code Status: Full Code Family Communication: none at bedside Disposition Plan: [place] to tele  Consultants:   Cards  Renal   Subjective: Continues to feel better overall, LLE more pain and tenderness  Objective: Vitals:   12/06/16 0400 12/06/16 0500 12/06/16 0800 12/06/16 1200  BP: 129/82  (!) 152/100 118/87  Pulse:      Resp: 18  (!) 22 (!) 21  Temp:   98.1 F (36.7 C)   TempSrc:   Oral   SpO2:   96% 97%  Weight:  (!) 148.3 kg (326 lb 15.1 oz) (!) 143.8 kg (317 lb 0.3 oz)   Height:        Intake/Output Summary  (Last 24 hours) at 12/06/16 1236 Last data filed at 12/06/16 1200  Gross per 24 hour  Intake             1302 ml  Output             2600 ml  Net            -1298 ml   Filed Weights   12/05/16 0711 12/06/16 0500 12/06/16 0800  Weight: (!) 142.5 kg (314 lb 2.5 oz) (!) 148.3 kg (326 lb 15.1 oz) (!) 143.8 kg (317 lb 0.3 oz)    Examination:  Gen: Awake, Alert, Oriented X 3, obese male, no distress HEENT: PERRLA, Neck supple, no JVD Lungs: decreased BS at bases CVS: irregular Abd: soft, Non tender, non distended, BS present Extremities: 2plus edema, LLE with erythema and tenderness too Skin: no new rashes    Data Reviewed:   CBC:  Recent Labs Lab 12/02/16 1405 12/03/16 0359 12/04/16 0340 12/05/16 0321 12/06/16 0334  WBC 18.0* 21.3* 15.2* 17.3* 14.1*  NEUTROABS 14.7*  --   --   --   --   HGB 13.0 11.8* 11.8* 12.4* 11.3*  HCT 39.3 36.8* 36.6* 38.8* 34.9*  MCV 80.2 79.8 79.0 80.7 80.0  PLT 374 315 333 382 174   Basic Metabolic Panel:  Recent Labs Lab 12/02/16 1405 12/03/16 0359 12/04/16 0340 12/05/16 0321 12/06/16 0334  NA 131* 130* 129* 130* 127*  K 3.6 4.2  3.1* 3.8 3.2*  CL 87* 85* 84* 82* 83*  CO2 29 29 29  32 31  GLUCOSE 233* 319* 297* 271* 347*  BUN 115* 128* 109* 119* 112*  CREATININE 3.12* 3.25* 3.34* 3.28* 3.32*  CALCIUM 8.8* 8.3* 8.2* 8.2* 7.7*  MG 1.7  --  1.5*  --   --    GFR: Estimated Creatinine Clearance: 41.2 mL/min (A) (by C-G formula based on SCr of 3.32 mg/dL (H)). Liver Function Tests:  Recent Labs Lab 12/02/16 1405 12/03/16 0359  AST 23 19  ALT 33 28  ALKPHOS 71 59  BILITOT 1.1 1.0  PROT 6.3* 5.7*  ALBUMIN 3.1* 2.6*   No results for input(s): LIPASE, AMYLASE in the last 168 hours. No results for input(s): AMMONIA in the last 168 hours. Coagulation Profile: No results for input(s): INR, PROTIME in the last 168 hours. Cardiac Enzymes: No results for input(s): CKTOTAL, CKMB, CKMBINDEX, TROPONINI in the last 168 hours. BNP (last  3 results) No results for input(s): PROBNP in the last 8760 hours. HbA1C: No results for input(s): HGBA1C in the last 72 hours. CBG:  Recent Labs Lab 12/05/16 0740 12/05/16 1125 12/05/16 1649 12/05/16 2158 12/06/16 1155  GLUCAP 254* 208* 180* 294* 175*   Lipid Profile: No results for input(s): CHOL, HDL, LDLCALC, TRIG, CHOLHDL, LDLDIRECT in the last 72 hours. Thyroid Function Tests: No results for input(s): TSH, T4TOTAL, FREET4, T3FREE, THYROIDAB in the last 72 hours. Anemia Panel: No results for input(s): VITAMINB12, FOLATE, FERRITIN, TIBC, IRON, RETICCTPCT in the last 72 hours. Urine analysis:    Component Value Date/Time   COLORURINE YELLOW 12/02/2016 1518   APPEARANCEUR CLEAR 12/02/2016 1518   LABSPEC 1.008 12/02/2016 1518   PHURINE 5.0 12/02/2016 1518   GLUCOSEU NEGATIVE 12/02/2016 1518   HGBUR NEGATIVE 12/02/2016 1518   BILIRUBINUR NEGATIVE 12/02/2016 1518   KETONESUR NEGATIVE 12/02/2016 1518   PROTEINUR 30 (A) 12/02/2016 1518   NITRITE NEGATIVE 12/02/2016 1518   LEUKOCYTESUR NEGATIVE 12/02/2016 1518   Sepsis Labs: @LABRCNTIP (procalcitonin:4,lacticidven:4)  ) Recent Results (from the past 240 hour(s))  Culture, blood (Routine X 2) w Reflex to ID Panel     Status: None (Preliminary result)   Collection Time: 12/02/16  2:05 PM  Result Value Ref Range Status   Specimen Description BLOOD LEFT ANTECUBITAL  Final   Special Requests   Final    BOTTLES DRAWN AEROBIC AND ANAEROBIC Blood Culture results may not be optimal due to an inadequate volume of blood received in culture bottles   Culture   Final    NO GROWTH 3 DAYS Performed at [facility], [place] [address], [place], [place] [address]    Report Status PENDING  Incomplete  Culture, blood (Routine X 2) w Reflex to ID Panel     Status: None (Preliminary result)   Collection Time: 12/02/16  2:07 PM  Result Value Ref Range Status   Specimen Description BLOOD RIGHT ANTECUBITAL  Final   Special Requests    Final    BOTTLES DRAWN AEROBIC AND ANAEROBIC Blood Culture results may not be optimal due to an inadequate volume of blood received in culture bottles   Culture   Final    NO GROWTH 3 DAYS Performed at [facility], [place] [address] [place], [place] [address]    Report Status PENDING  Incomplete  MRSA PCR Screening     Status: None   Collection Time: 12/02/16 10:01 PM  Result Value Ref Range Status   MRSA by PCR  NEGATIVE NEGATIVE Final    Comment:        The GeneXpert MRSA Assay (FDA approved for NASAL specimens only), is one component of a comprehensive MRSA colonization surveillance program. It is not intended to diagnose MRSA infection nor to guide or monitor treatment for MRSA infections.          Radiology Studies: No results found.      Scheduled Meds: . carvedilol  25 mg Oral BID WC  . colchicine  0.6 mg Oral BID  . diltiazem  300 mg Oral Daily  . gabapentin  300 mg Oral TID  . insulin aspart  0-9 Units Subcutaneous TID WC  . insulin aspart  6 Units Subcutaneous TID WC  . insulin glargine  35 Units Subcutaneous QHS  . metolazone  5 mg Oral Daily  . potassium chloride  40 mEq Oral TID  . rivaroxaban  20 mg Oral Q supper  . sodium chloride flush  3 mL Intravenous Q12H   Continuous Infusions: . sodium chloride    . doxycycline (VIBRAMYCIN) IV Stopped (12/06/16 1000)  . furosemide 160 mg (12/06/16 0538)  . sodium chloride    . sodium chloride       LOS: 4 days    Time spent: 12min    Domenic Polite, MD [facility] Pager [phone]  If 7PM-7AM, please contact night-coverage [email or web address] Password [facility] 12/06/2016, 12:36 PM

## 2016-12-06 NOTE — Progress Notes (Signed)
Progress Note  Patient Name: John Bailey Date of Encounter: 12/06/2016  Primary Cardiologist: [facility]  Subjective   46 yo with chronic combined CHF, significant CKD  I/O = -4.7 liters   Inpatient Medications    Scheduled Meds: . carvedilol  25 mg Oral BID WC  . colchicine  0.6 mg Oral BID  . diltiazem  300 mg Oral Daily  . gabapentin  300 mg Oral TID  . insulin aspart  0-9 Units Subcutaneous TID WC  . insulin aspart  6 Units Subcutaneous TID WC  . insulin glargine  35 Units Subcutaneous QHS  . metolazone  5 mg Oral Daily  . potassium chloride  40 mEq Oral TID  . rivaroxaban  20 mg Oral Q supper  . sodium chloride flush  3 mL Intravenous Q12H   Continuous Infusions: . sodium chloride    . doxycycline (VIBRAMYCIN) IV 100 mg (12/06/16 0800)  . furosemide 160 mg (12/06/16 0538)  . sodium chloride    . sodium chloride     PRN Meds: sodium chloride, acetaminophen **OR** acetaminophen, albuterol, levalbuterol, ondansetron **OR** ondansetron (ZOFRAN) IV, oxyCODONE, sodium chloride flush   Vital Signs    Vitals:   12/06/16 0358 12/06/16 0400 12/06/16 0500 12/06/16 0800  BP:  129/82  (!) 152/100  Pulse:      Resp:  18  (!) 22  Temp: (!) 97.3 F (36.3 C)   98.1 F (36.7 C)  TempSrc: Axillary   Oral  SpO2:    96%  Weight:   (!) 326 lb 15.1 oz (148.3 kg) (!) 317 lb 0.3 oz (143.8 kg)  Height:        Intake/Output Summary (Last 24 hours) at 12/06/16 0904 Last data filed at 12/06/16 0800  Gross per 24 hour  Intake             1048 ml  Output             2450 ml  Net            -1402 ml   Filed Weights   12/05/16 0711 12/06/16 0500 12/06/16 0800  Weight: (!) 314 lb 2.5 oz (142.5 kg) (!) 326 lb 15.1 oz (148.3 kg) (!) 317 lb 0.3 oz (143.8 kg)    Telemetry    Atrial  Fib  - Personally Reviewed  ECG     Atrial fib  - Personally Reviewed  Physical Exam   GEN:  young  black gentleman. No acute distress. He is morbidly obese. Neck:  + JVD Cardiac:  irregular  irregular. Soft systolic murmur. Respiratory: Clear to auscultation bilaterally. GI:  morbidly obese. MS:   3+ pitting edema  up to his thighs. Neuro:  Nonfocal  Psych: Normal affect   Labs    Chemistry Recent Labs Lab 12/02/16 1405 12/03/16 0359 12/04/16 0340 12/05/16 0321 12/06/16 0334  NA 131* 130* 129* 130* 127*  K 3.6 4.2 3.1* 3.8 3.2*  CL 87* 85* 84* 82* 83*  CO2 29 29 29  32 31  GLUCOSE 233* 319* 297* 271* 347*  BUN 115* 128* 109* 119* 112*  CREATININE 3.12* 3.25* 3.34* 3.28* 3.32*  CALCIUM 8.8* 8.3* 8.2* 8.2* 7.7*  PROT 6.3* 5.7*  --   --   --   ALBUMIN 3.1* 2.6*  --   --   --   AST 23 19  --   --   --   ALT 33 28  --   --   --  ALKPHOS 71 59  --   --   --   BILITOT 1.1 1.0  --   --   --   GFRNONAA 23* 22* 21* 21* 21*  GFRAA 26* 25* 24* 25* 24*  ANIONGAP 15 16* 16* 16* 13     Hematology Recent Labs Lab 12/04/16 0340 12/05/16 0321 12/06/16 0334  WBC 15.2* 17.3* 14.1*  RBC 4.63 4.81 4.36  HGB 11.8* 12.4* 11.3*  HCT 36.6* 38.8* 34.9*  MCV 79.0 80.7 80.0  MCH 25.5* 25.8* 25.9*  MCHC 32.2 32.0 32.4  RDW 16.2* 16.2* 16.2*  PLT 333 382 332    Cardiac EnzymesNo results for input(s): TROPONINI in the last 168 hours.  Recent Labs Lab 12/02/16 1416  TROPIPOC 0.26*     BNP Recent Labs Lab 12/02/16 1405  BNP 554.8*     DDimer No results for input(s): DDIMER in the last 168 hours.   Radiology    No results found.  Cardiac Studies     Patient Profile     46 y.o. male with chronic combined systolic and diastolic congestive heart failure. He also has chronic kidney disease.  Assessment & Plan    1. Acute on chronic combined systolic and diastolic congestive heart failure: The patient has been noncompliant with his medications and his diet. He's been diuresing fairly well. His wife has been binging bringing him food from [place] .  I placed a fluid restriction of 2 L on him. We may need to titrate that up further.  2. Chronic atrial  fibrillation: Continue current medications. Rate is well-controlled.  3. Chronic kidney disease: Further plans per internal medicine.  For questions or updates, please contact [place] Please consult [email or web address] for contact info under Cardiology/STEMI.      Signed, Mertie Moores, MD  12/06/2016, 9:04 AM

## 2016-12-07 ENCOUNTER — Encounter (HOSPITAL_COMMUNITY): Payer: Self-pay | Admitting: Cardiology

## 2016-12-07 LAB — BASIC METABOLIC PANEL
Anion gap: 14 (ref 5–15)
BUN: 101 mg/dL — AB (ref 6–20)
CO2: 32 mmol/L (ref 22–32)
CREATININE: 3.02 mg/dL — AB (ref 0.61–1.24)
Calcium: 8.7 mg/dL — ABNORMAL LOW (ref 8.9–10.3)
Chloride: 85 mmol/L — ABNORMAL LOW (ref 101–111)
GFR, EST AFRICAN AMERICAN: 27 mL/min — AB (ref >60)
GFR, EST NON AFRICAN AMERICAN: 24 mL/min — AB (ref >60)
GLUCOSE: 155 mg/dL — AB (ref 65–99)
POTASSIUM: 3.4 mmol/L — AB (ref 3.5–5.1)
SODIUM: 131 mmol/L — AB (ref 135–145)

## 2016-12-07 LAB — CULTURE, BLOOD (ROUTINE X 2)
CULTURE: NO GROWTH
Culture: NO GROWTH

## 2016-12-07 LAB — GLUCOSE, CAPILLARY
GLUCOSE-CAPILLARY: 214 mg/dL — AB (ref 65–99)
GLUCOSE-CAPILLARY: 211 mg/dL — AB (ref 65–99)
Glucose-Capillary: 179 mg/dL — ABNORMAL HIGH (ref 65–99)
Glucose-Capillary: 226 mg/dL — ABNORMAL HIGH (ref 65–99)

## 2016-12-07 MED ORDER — POTASSIUM CHLORIDE CRYS ER 20 MEQ PO TBCR
20.0000 meq | EXTENDED_RELEASE_TABLET | Freq: Once | ORAL | Status: AC
  Administered 2016-12-07: 20 meq via ORAL
  Filled 2016-12-07: qty 1

## 2016-12-07 NOTE — Care Management Note (Signed)
Case Management Note  Patient Details  Name: PHONG ISENBERG MRN: [id number] Date of Birth: Jul 30, 1970  Subjective/Objective:                  A.fib, chf, iv lasix drip  Action/Plan: Date:  December 07, 2016 Chart reviewed for concurrent status and case management needs.  Will continue to follow patient progress.  Discharge Planning: following for needs  Expected discharge date: [id number]  Velva Harman, BSN, [place], [place]   Expected Discharge Date:   (unknown)               Expected Discharge Plan:  Home/Self Care  In-House Referral:     Discharge planning Services  CM Consult  Post Acute Care Choice:    Choice offered to:     DME Arranged:    DME Agency:     HH Arranged:    [place] Agency:     Status of Service:  In process, will continue to follow  If discussed at Long Length of Stay Meetings, dates discussed:    Additional Comments:  Leeroy Cha, RN 12/07/2016, 9:40 AM

## 2016-12-07 NOTE — Care Management Note (Signed)
Case Management Note  Patient Details  Name: John Bailey MRN: [id number] Date of Birth: 08-Feb-1971  Subjective/Objective:Transfer from SDU. afib-cardio.AKI/CKD-nephro. From home.                    Action/Plan:d/c plan home.   Expected Discharge Date:   (unknown)               Expected Discharge Plan:  Home/Self Care  In-House Referral:     Discharge planning Services  CM Consult  Post Acute Care Choice:    Choice offered to:     DME Arranged:    DME Agency:     HH Arranged:    HH Agency:     Status of Service:  In process, will continue to follow  If discussed at Long Length of Stay Meetings, dates discussed:    Additional Comments:  Dessa Phi, RN 12/07/2016, 3:44 PM

## 2016-12-07 NOTE — Progress Notes (Signed)
PROGRESS NOTE    John Bailey  NAT:[id number] DOB: 10-17-1970 DOA: 12/02/2016 PCP: [name], MD  Brief Narrative:46 y.o. male with a hx of diabetes, hypertension, atrial fibrillation on Xarelto, chronic systolic and diastolic CHF,  CKD4  admitted with severe vol overload, AKI and Afib RVR  Assessment & Plan:    Acute combined systolic (congestive) and diastolic (congestive) heart failure ([place]) -with profound edema, felt to be 20-30lbs above dry weight on admission -last ECHO with improvement in EF from 40 to 55% and grade 2DD -NICM due to hypertension -see below, 5.1L negative -ambulate, OOB -PT eval    AKi on CKD4 -baseline creatinine around 2.5 -due to DM and hypertensive nephropathy and now with cardiorenal syndrome -continue lasix high dose IV lasix per Renal -Renal consult greatly appreciated -creatinine stable at 3.3 -weight, negative 5.1, weight very slow to come off  Secondary LLE cellulitis -improving, continue DOxy Day 3 -monitor -change to Po abx soon    Paroxysmal atrial fibrillation ([place]) -with RVR on cardizem gtt -chadsvasc score is 5, on xarelto, may need to be switched to coumadin depending on how creatinine trends -rate improving, changed to PO cardizem  -HR better   HTN -BP stable    Insulin-dependent diabetes mellitus with renal complications ([place]) -on lantus at home, now SSI  CBGS better now on increased lantus and meal coverage  DVT prophylaxis: xarelto Code Status: Full Code Family Communication: none at bedside Disposition Plan: remains in SDU, no tele beds currently  Consultants:   Cards  Renal   Subjective: - Objective: Vitals:   12/07/16 0349 12/07/16 0406 12/07/16 0500 12/07/16 0800  BP: (!) 139/110   (!) 151/97  Pulse:      Resp: 19     Temp:  99.2 F (37.3 C)  98.6 F (37 C)  TempSrc:  Oral  Oral  SpO2: 97%   97%  Weight:   (!) 143.7 kg (316 lb 12.8 oz)   Height:        Intake/Output Summary (Last 24  hours) at 12/07/16 1222 Last data filed at 12/07/16 0903  Gross per 24 hour  Intake              862 ml  Output             1700 ml  Net             -838 ml   Filed Weights   12/06/16 0500 12/06/16 0800 12/07/16 0500  Weight: (!) 148.3 kg (326 lb 15.1 oz) (!) 143.8 kg (317 lb 0.3 oz) (!) 143.7 kg (316 lb 12.8 oz)    Examination:  Gen: Awake, Alert, Oriented X 3,  HEENT: PERRLA, Neck supple, no JVD Lungs: Good air movement bilaterally, CTAB CVS: RRR,No Gallops,Rubs or new Murmurs Abd: soft, Non tender, non distended, BS present Extremities: Pulses 2+ edema greater than the left lower extremity, left lower leg covered with dressing Skin: no new rashes    Data Reviewed:   CBC:  Recent Labs Lab 12/02/16 1405 12/03/16 0359 12/04/16 0340 12/05/16 0321 12/06/16 0334  WBC 18.0* 21.3* 15.2* 17.3* 14.1*  NEUTROABS 14.7*  --   --   --   --   HGB 13.0 11.8* 11.8* 12.4* 11.3*  HCT 39.3 36.8* 36.6* 38.8* 34.9*  MCV 80.2 79.8 79.0 80.7 80.0  PLT 374 315 333 382 427   Basic Metabolic Panel:  Recent Labs Lab 12/02/16 1405 12/03/16 0359 12/04/16 0340 12/05/16 0321 12/06/16 0334 12/07/16  0749  NA 131* 130* 129* 130* 127* 131*  K 3.6 4.2 3.1* 3.8 3.2* 3.4*  CL 87* 85* 84* 82* 83* 85*  CO2 29 29 29  32 31 32  GLUCOSE 233* 319* 297* 271* 347* 155*  BUN 115* 128* 109* 119* 112* 101*  CREATININE 3.12* 3.25* 3.34* 3.28* 3.32* 3.02*  CALCIUM 8.8* 8.3* 8.2* 8.2* 7.7* 8.7*  MG 1.7  --  1.5*  --   --   --    GFR: Estimated Creatinine Clearance: 45.3 mL/min (A) (by C-G formula based on SCr of 3.02 mg/dL (H)). Liver Function Tests:  Recent Labs Lab 12/02/16 1405 12/03/16 0359  AST 23 19  ALT 33 28  ALKPHOS 71 59  BILITOT 1.1 1.0  PROT 6.3* 5.7*  ALBUMIN 3.1* 2.6*   No results for input(s): LIPASE, AMYLASE in the last 168 hours. No results for input(s): AMMONIA in the last 168 hours. Coagulation Profile: No results for input(s): INR, PROTIME in the last 168  hours. Cardiac Enzymes: No results for input(s): CKTOTAL, CKMB, CKMBINDEX, TROPONINI in the last 168 hours. BNP (last 3 results) No results for input(s): PROBNP in the last 8760 hours. HbA1C: No results for input(s): HGBA1C in the last 72 hours. CBG:  Recent Labs Lab 12/06/16 1155 12/06/16 1618 12/06/16 2143 12/07/16 0808 12/07/16 1206  GLUCAP 175* 154* 219* 214* 179*   Lipid Profile: No results for input(s): CHOL, HDL, LDLCALC, TRIG, CHOLHDL, LDLDIRECT in the last 72 hours. Thyroid Function Tests: No results for input(s): TSH, T4TOTAL, FREET4, T3FREE, THYROIDAB in the last 72 hours. Anemia Panel: No results for input(s): VITAMINB12, FOLATE, FERRITIN, TIBC, IRON, RETICCTPCT in the last 72 hours. Urine analysis:    Component Value Date/Time   COLORURINE YELLOW 12/02/2016 1518   APPEARANCEUR CLEAR 12/02/2016 1518   LABSPEC 1.008 12/02/2016 1518   PHURINE 5.0 12/02/2016 1518   GLUCOSEU NEGATIVE 12/02/2016 1518   HGBUR NEGATIVE 12/02/2016 1518   BILIRUBINUR NEGATIVE 12/02/2016 1518   KETONESUR NEGATIVE 12/02/2016 1518   PROTEINUR 30 (A) 12/02/2016 1518   NITRITE NEGATIVE 12/02/2016 1518   LEUKOCYTESUR NEGATIVE 12/02/2016 1518   Sepsis Labs: @LABRCNTIP (procalcitonin:4,lacticidven:4)  ) Recent Results (from the past 240 hour(s))  Culture, blood (Routine X 2) w Reflex to ID Panel     Status: None (Preliminary result)   Collection Time: 12/02/16  2:05 PM  Result Value Ref Range Status   Specimen Description BLOOD LEFT ANTECUBITAL  Final   Special Requests   Final    BOTTLES DRAWN AEROBIC AND ANAEROBIC Blood Culture results may not be optimal due to an inadequate volume of blood received in culture bottles   Culture   Final    NO GROWTH 4 DAYS Performed at [facility], [place] [address], [place], [place] [address]    Report Status PENDING  Incomplete  Culture, blood (Routine X 2) w Reflex to ID Panel     Status: None (Preliminary result)   Collection Time: 12/02/16   2:07 PM  Result Value Ref Range Status   Specimen Description BLOOD RIGHT ANTECUBITAL  Final   Special Requests   Final    BOTTLES DRAWN AEROBIC AND ANAEROBIC Blood Culture results may not be optimal due to an inadequate volume of blood received in culture bottles   Culture   Final    NO GROWTH 4 DAYS Performed at [facility], [place] [address] [place], [place] [address]    Report Status PENDING  Incomplete  MRSA PCR Screening  Status: None   Collection Time: 12/02/16 10:01 PM  Result Value Ref Range Status   MRSA by PCR NEGATIVE NEGATIVE Final    Comment:        The GeneXpert MRSA Assay (FDA approved for NASAL specimens only), is one component of a comprehensive MRSA colonization surveillance program. It is not intended to diagnose MRSA infection nor to guide or monitor treatment for MRSA infections.          Radiology Studies: No results found.      Scheduled Meds: . carvedilol  25 mg Oral BID WC  . colchicine  0.6 mg Oral BID  . diltiazem  300 mg Oral Daily  . gabapentin  300 mg Oral TID  . insulin aspart  0-9 Units Subcutaneous TID WC  . insulin aspart  6 Units Subcutaneous TID WC  . insulin glargine  35 Units Subcutaneous QHS  . metolazone  5 mg Oral Daily  . rivaroxaban  20 mg Oral Q supper  . sodium chloride flush  3 mL Intravenous Q12H   Continuous Infusions: . sodium chloride    . doxycycline (VIBRAMYCIN) IV Stopped (12/07/16 1100)  . furosemide Stopped (12/07/16 0557)  . sodium chloride    . sodium chloride       LOS: 5 days    Time spent: 24min    Domenic Polite, MD [facility] Pager [phone]  If 7PM-7AM, please contact night-coverage [email or web address] Password [facility] 12/07/2016, 12:22 PM

## 2016-12-07 NOTE — Progress Notes (Signed)
CSW received inappropriate consult for "medication assistance". Please consult RN CM for assistance.   Kingsley Spittle, [facility] Emergency Room Clinical Social Worker [phone]

## 2016-12-07 NOTE — Progress Notes (Signed)
Progress Note  Patient Name: John Bailey Date of Encounter: 12/07/2016  Primary Cardiologist: Hilty  Subjective   No chest pain, breathing is stable, not aware of a fib.  Inpatient Medications    Scheduled Meds: . carvedilol  25 mg Oral BID WC  . colchicine  0.6 mg Oral BID  . diltiazem  300 mg Oral Daily  . gabapentin  300 mg Oral TID  . insulin aspart  0-9 Units Subcutaneous TID WC  . insulin aspart  6 Units Subcutaneous TID WC  . insulin glargine  35 Units Subcutaneous QHS  . metolazone  5 mg Oral Daily  . rivaroxaban  20 mg Oral Q supper  . sodium chloride flush  3 mL Intravenous Q12H   Continuous Infusions: . sodium chloride    . doxycycline (VIBRAMYCIN) IV Stopped (12/06/16 2137)  . furosemide Stopped (12/07/16 0557)  . sodium chloride    . sodium chloride     PRN Meds: sodium chloride, acetaminophen **OR** acetaminophen, albuterol, levalbuterol, ondansetron **OR** ondansetron (ZOFRAN) IV, oxyCODONE, sodium chloride flush   Vital Signs    Vitals:   12/06/16 2352 12/07/16 0349 12/07/16 0406 12/07/16 0500  BP:  (!) 139/110    Pulse:      Resp:  19    Temp: 98.2 F (36.8 C)  99.2 F (37.3 C)   TempSrc: Axillary  Oral   SpO2:  97%    Weight:    (!) 316 lb 12.8 oz (143.7 kg)  Height:        Intake/Output Summary (Last 24 hours) at 12/07/16 0757 Last data filed at 12/07/16 0557  Gross per 24 hour  Intake             1552 ml  Output             2350 ml  Net             -798 ml   Filed Weights   12/06/16 0500 12/06/16 0800 12/07/16 0500  Weight: (!) 326 lb 15.1 oz (148.3 kg) (!) 317 lb 0.3 oz (143.8 kg) (!) 316 lb 12.8 oz (143.7 kg)    Telemetry    A fib this AM with HR 110-120 with rest down to more controlled - Personally Reviewed  ECG    No new - Personally Reviewed  Physical Exam   GEN: No acute distress.   Neck: No JVD Cardiac:irreg irreg, no murmurs, rubs, or gallops.  Respiratory: Clear to auscultation bilaterally. GI: Soft,  nontender, non-distended + BS, + edema MS: 4-5+ edema into abd; legs tender and with drainage from fluid blisters on legs, dressing in place. No deformity. Neuro:  Nonfocal  Psych: Normal affect   Labs    Chemistry Recent Labs Lab 12/02/16 1405 12/03/16 0359 12/04/16 0340 12/05/16 0321 12/06/16 0334  NA 131* 130* 129* 130* 127*  K 3.6 4.2 3.1* 3.8 3.2*  CL 87* 85* 84* 82* 83*  CO2 29 29 29  32 31  GLUCOSE 233* 319* 297* 271* 347*  BUN 115* 128* 109* 119* 112*  CREATININE 3.12* 3.25* 3.34* 3.28* 3.32*  CALCIUM 8.8* 8.3* 8.2* 8.2* 7.7*  PROT 6.3* 5.7*  --   --   --   ALBUMIN 3.1* 2.6*  --   --   --   AST 23 19  --   --   --   ALT 33 28  --   --   --   ALKPHOS 71 59  --   --   --  BILITOT 1.1 1.0  --   --   --   GFRNONAA 23* 22* 21* 21* 21*  GFRAA 26* 25* 24* 25* 24*  ANIONGAP 15 16* 16* 16* 13     Hematology Recent Labs Lab 12/04/16 0340 12/05/16 0321 12/06/16 0334  WBC 15.2* 17.3* 14.1*  RBC 4.63 4.81 4.36  HGB 11.8* 12.4* 11.3*  HCT 36.6* 38.8* 34.9*  MCV 79.0 80.7 80.0  MCH 25.5* 25.8* 25.9*  MCHC 32.2 32.0 32.4  RDW 16.2* 16.2* 16.2*  PLT 333 382 332    Cardiac EnzymesNo results for input(s): TROPONINI in the last 168 hours.  Recent Labs Lab 12/02/16 1416  TROPIPOC 0.26*     BNP Recent Labs Lab 12/02/16 1405  BNP 554.8*     DDimer No results for input(s): DDIMER in the last 168 hours.   Radiology    No results found.  Cardiac Studies   Echo 06/2016 Study Conclusions  - Left ventricle: The cavity size is small. 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 Systolic function was normal. The   estimated ejection fraction was in the range of 50% to 55%.   Distal anterior/apical hypokinesis seen on Definity contrast   images. Doppler parameters are consistent with restrictive   physiology (grade 3 diastolic dysfunction). The E/A ratio is   >2.5. The E/e&' ratio is >20,  suggesting markedly elevated LV   filling pressure. - Mitral valve: Mitral B-notch noted, suggesting elevated LA   pressure. - Left atrium: Moderately dilated. - Right ventricle: The cavity size was mildly dilated. Systolic   function was normal. - Right atrium: Severely dilated. - Tricuspid valve: There was trivial regurgitation. - Pulmonary arteries: PA peak pressure: 19 mm Hg (S). - Inferior vena cava: The vessel was normal in size. The   respirophasic diameter changes were in the normal range (>= 50%),   consistent with normal central venous pressure. - Pericardium, extracardiac: Small pericardial effusion. Features   were not consistent with tamponade physiology.  Impressions:  - Compared to a prior study in 2017, the LVEF has increased to   50-55% with distal anterior/apical hypokinesis as seen on   Definity contrast images.   Patient Profile     46 y.o. male with chronic combined systolic and diastolic congestive heart failure. He also has chronic kidney disease  Assessment & Plan    1. Acute on chronic systolic and diastolic HF- last EF in 09/3708 50-55%  G3 DD (EF 2017 25-30%) , BNP on admit 554  Pt with hx non compliance now diuresing neg 5,172 since admit and wt down from 320 lbs to 316 lbs. Now on metolazone 5 mg daily  And IV lasix 160 mg every 12 hours. --Cr 3.32 up from 3.28 and on admit 3.12.   2. Persistent  a fib ( SR in Jan 2018 EKG and in May by exam) on admit rapid a fib at 198 with RBBB) rate well controlled now, on coreg 25 BID, dilt CD 300 mg daily  -? TEE DCCV once volume improved.  3.  Anticoagulation on Xarelto, CHA2DS2VASc is 3  4. CKD 4- renal following see above  5. Hyponatremia now 127  6.  Hx NICM with recurrent a fib and edema possible decrease in EF.   7.  DM insulin dependent per IM   8. Hypokalemia replace   For questions or updates, please contact [place] Please consult [email or web address] for contact  info under  Cardiology/STEMI.       Signed, Cecilie Kicks, NP  12/07/2016, 7:57 AM    History and all data above reviewed.  Patient examined.  I agree with the findings as above.   Breathing better.  No chest pain.  Still not breathing at baseline. The patient exam reveals KIC:[id number]  ,  Lungs: clear  ,  Abd: Distended, Ext Severe edema (anasarca)  .  All available labs, radiology testing, previous records reviewed. Agree with documented assessment and plan. Acute on chronic diastolic HF:  Good duresis.  Needs to keep his feet elevated.  Increased creat in this situation is likely related in part to venous congestion so we need to continue to decongest.  Agree with meds as ordered.    Jeneen Rinks Danna Sewell  11:53 AM  12/07/2016

## 2016-12-07 NOTE — Progress Notes (Signed)
Patient ID: John Bailey, male   DOB: 07/21/1970, 46 y.o.   MRN: [id number] S:No new complaints O:BP (!) 151/97 (BP Location: Left Arm)   Pulse (!) 107   Temp 98.6 F (37 C) (Oral)   Resp 19   Ht 5\' 11"  (1.803 m)   Wt (!) 143.7 kg (316 lb 12.8 oz)   SpO2 97%   BMI 44.18 kg/m   Intake/Output Summary (Last 24 hours) at 12/07/16 1231 Last data filed at 12/07/16 0903  Gross per 24 hour  Intake              862 ml  Output             1700 ml  Net             -838 ml   Intake/Output: I/O last 3 completed shifts: In: 1858 [P.O.:1160; IV Piggyback:698] Out: 3150 [Urine:3150]  Intake/Output this shift:  Total I/O In: 0  Out: 400 [Urine:400] Weight change: 1.3 kg (2 lb 13.9 oz) Gen:NAD CVS:IRR IRR Resp:cta GDJ:MEQAS, +BS, soft Ext:2+ edema   Recent Labs Lab 12/02/16 1405 12/03/16 0359 12/04/16 0340 12/05/16 0321 12/06/16 0334 12/07/16 0749  NA 131* 130* 129* 130* 127* 131*  K 3.6 4.2 3.1* 3.8 3.2* 3.4*  CL 87* 85* 84* 82* 83* 85*  CO2 29 29 29  32 31 32  GLUCOSE 233* 319* 297* 271* 347* 155*  BUN 115* 128* 109* 119* 112* 101*  CREATININE 3.12* 3.25* 3.34* 3.28* 3.32* 3.02*  ALBUMIN 3.1* 2.6*  --   --   --   --   CALCIUM 8.8* 8.3* 8.2* 8.2* 7.7* 8.7*  AST 23 19  --   --   --   --   ALT 33 28  --   --   --   --    Liver Function Tests:  Recent Labs Lab 12/02/16 1405 12/03/16 0359  AST 23 19  ALT 33 28  ALKPHOS 71 59  BILITOT 1.1 1.0  PROT 6.3* 5.7*  ALBUMIN 3.1* 2.6*   No results for input(s): LIPASE, AMYLASE in the last 168 hours. No results for input(s): AMMONIA in the last 168 hours. CBC:  Recent Labs Lab 12/02/16 1405 12/03/16 0359 12/04/16 0340 12/05/16 0321 12/06/16 0334  WBC 18.0* 21.3* 15.2* 17.3* 14.1*  NEUTROABS 14.7*  --   --   --   --   HGB 13.0 11.8* 11.8* 12.4* 11.3*  HCT 39.3 36.8* 36.6* 38.8* 34.9*  MCV 80.2 79.8 79.0 80.7 80.0  PLT 374 315 333 382 332   Cardiac Enzymes: No results for input(s): CKTOTAL, CKMB, CKMBINDEX,  TROPONINI in the last 168 hours. CBG:  Recent Labs Lab 12/06/16 1155 12/06/16 1618 12/06/16 2143 12/07/16 0808 12/07/16 1206  GLUCAP 175* 154* 219* 214* 179*    Iron Studies: No results for input(s): IRON, TIBC, TRANSFERRIN, FERRITIN in the last 72 hours. Studies/Results: No results found. . carvedilol  25 mg Oral BID WC  . colchicine  0.6 mg Oral BID  . diltiazem  300 mg Oral Daily  . gabapentin  300 mg Oral TID  . insulin aspart  0-9 Units Subcutaneous TID WC  . insulin aspart  6 Units Subcutaneous TID WC  . insulin glargine  35 Units Subcutaneous QHS  . metolazone  5 mg Oral Daily  . rivaroxaban  20 mg Oral Q supper  . sodium chloride flush  3 mL Intravenous Q12H    BMET    Component Value Date/Time  NA 131 (L) 12/07/2016 0749   K 3.4 (L) 12/07/2016 0749   CL 85 (L) 12/07/2016 0749   CO2 32 12/07/2016 0749   GLUCOSE 155 (H) 12/07/2016 0749   BUN 101 (H) 12/07/2016 0749   CREATININE 3.02 (H) 12/07/2016 0749   CALCIUM 8.7 (L) 12/07/2016 0749   GFRNONAA 24 (L) 12/07/2016 0749   GFRAA 27 (L) 12/07/2016 0749   CBC    Component Value Date/Time   WBC 14.1 (H) 12/06/2016 0334   RBC 4.36 12/06/2016 0334   HGB 11.3 (L) 12/06/2016 0334   HCT 34.9 (L) 12/06/2016 0334   PLT 332 12/06/2016 0334   MCV 80.0 12/06/2016 0334   MCH 25.9 (L) 12/06/2016 0334   MCHC 32.4 12/06/2016 0334   RDW 16.2 (H) 12/06/2016 0334   LYMPHSABS 2.2 12/02/2016 1405   MONOABS 1.1 (H) 12/02/2016 1405   EOSABS 0.0 12/02/2016 1405   BASOSABS 0.0 12/02/2016 1405     Assessment/Plan:  1. AKI/CKD stage 4 in setting of decompensated CHF.  Scr has slowly been trending toward baseline of 2.5.  Responding to IV lasxi and po zaroxolyn, however he was eating fried foods from the cafeteria and not adhering to low sodium diet.   2. Volume overload/anasarca- as above 3. NICM- EF 35% 4. A fib with RVR- per cardiology 5. DM type 2 on insulin 6. Obesity 7. HTN- stable  Donetta Potts,  MD [employer] [employer] [phone]

## 2016-12-08 DIAGNOSIS — I5041 Acute combined systolic (congestive) and diastolic (congestive) heart failure: Secondary | ICD-10-CM

## 2016-12-08 LAB — CBC
HCT: 39.7 % (ref 39.0–52.0)
HEMOGLOBIN: 12.7 g/dL — AB (ref 13.0–17.0)
MCH: 25.8 pg — AB (ref 26.0–34.0)
MCHC: 32 g/dL (ref 30.0–36.0)
MCV: 80.7 fL (ref 78.0–100.0)
PLATELETS: 380 10*3/uL (ref 150–400)
RBC: 4.92 MIL/uL (ref 4.22–5.81)
RDW: 16.4 % — AB (ref 11.5–15.5)
WBC: 16.9 10*3/uL — ABNORMAL HIGH (ref 4.0–10.5)

## 2016-12-08 LAB — BASIC METABOLIC PANEL
ANION GAP: 14 (ref 5–15)
BUN: 99 mg/dL — ABNORMAL HIGH (ref 6–20)
CALCIUM: 8.5 mg/dL — AB (ref 8.9–10.3)
CHLORIDE: 84 mmol/L — AB (ref 101–111)
CO2: 32 mmol/L (ref 22–32)
Creatinine, Ser: 2.79 mg/dL — ABNORMAL HIGH (ref 0.61–1.24)
GFR calc non Af Amer: 26 mL/min — ABNORMAL LOW (ref >60)
GFR, EST AFRICAN AMERICAN: 30 mL/min — AB (ref >60)
Glucose, Bld: 209 mg/dL — ABNORMAL HIGH (ref 65–99)
Potassium: 3.5 mmol/L (ref 3.5–5.1)
Sodium: 130 mmol/L — ABNORMAL LOW (ref 135–145)

## 2016-12-08 LAB — GLUCOSE, CAPILLARY
GLUCOSE-CAPILLARY: 171 mg/dL — AB (ref 65–99)
Glucose-Capillary: 219 mg/dL — ABNORMAL HIGH (ref 65–99)
Glucose-Capillary: 187 mg/dL — ABNORMAL HIGH (ref 65–99)
Glucose-Capillary: 194 mg/dL — ABNORMAL HIGH (ref 65–99)

## 2016-12-08 MED ORDER — MENTHOL 3 MG MT LOZG
1.0000 | LOZENGE | OROMUCOSAL | Status: DC | PRN
  Administered 2016-12-08 (×2): 3 mg via ORAL
  Filled 2016-12-08: qty 9

## 2016-12-08 MED ORDER — FUROSEMIDE 10 MG/ML IJ SOLN
160.0000 mg | Freq: Three times a day (TID) | INTRAMUSCULAR | Status: DC
  Administered 2016-12-08 – 2016-12-14 (×18): 160 mg via INTRAVENOUS
  Filled 2016-12-08 (×3): qty 10
  Filled 2016-12-08: qty 16
  Filled 2016-12-08: qty 10
  Filled 2016-12-08: qty 16
  Filled 2016-12-08: qty 10
  Filled 2016-12-08 (×3): qty 16
  Filled 2016-12-08: qty 10
  Filled 2016-12-08 (×2): qty 16
  Filled 2016-12-08: qty 2
  Filled 2016-12-08: qty 16
  Filled 2016-12-08 (×2): qty 10
  Filled 2016-12-08: qty 16
  Filled 2016-12-08: qty 10
  Filled 2016-12-08 (×2): qty 16
  Filled 2016-12-08: qty 10

## 2016-12-08 MED ORDER — LIP MEDEX EX OINT
1.0000 "application " | TOPICAL_OINTMENT | CUTANEOUS | Status: DC | PRN
  Administered 2016-12-08: 1 via TOPICAL
  Filled 2016-12-08: qty 7

## 2016-12-08 NOTE — Progress Notes (Signed)
Patient ID: John Bailey, male   DOB: Sep 08, 1970, 46 y.o.   MRN: [id number] S:No new complaints O:BP 121/86 (BP Location: Left Arm)   Pulse 99   Temp 98.3 F (36.8 C) (Oral)   Resp 18   Ht 5\' 11"  (1.803 m)   Wt (!) 144.2 kg (318 lb)   SpO2 97%   BMI 44.35 kg/m   Intake/Output Summary (Last 24 hours) at 12/08/16 1404 Last data filed at 12/08/16 1004  Gross per 24 hour  Intake             1065 ml  Output              950 ml  Net              115 ml   Intake/Output: I/O last 3 completed shifts: In: 2271 [P.O.:1320; I.V.:3; IV Piggyback:948] Out: 2575 [Urine:2575]  Intake/Output this shift:  Total I/O In: 200 [P.O.:200] Out: -  Weight change: 0.444 kg (15.7 oz) VZD:GLOVF in NAD CVS:no rub Resp:decreased BS IEP:PIRJJ +BS, soft Ext:2+ edema/anasarca   Recent Labs Lab 12/02/16 1405 12/03/16 0359 12/04/16 0340 12/05/16 0321 12/06/16 0334 12/07/16 0749 12/08/16 0554  NA 131* 130* 129* 130* 127* 131* 130*  K 3.6 4.2 3.1* 3.8 3.2* 3.4* 3.5  CL 87* 85* 84* 82* 83* 85* 84*  CO2 29 29 29  32 31 32 32  GLUCOSE 233* 319* 297* 271* 347* 155* 209*  BUN 115* 128* 109* 119* 112* 101* 99*  CREATININE 3.12* 3.25* 3.34* 3.28* 3.32* 3.02* 2.79*  ALBUMIN 3.1* 2.6*  --   --   --   --   --   CALCIUM 8.8* 8.3* 8.2* 8.2* 7.7* 8.7* 8.5*  AST 23 19  --   --   --   --   --   ALT 33 28  --   --   --   --   --    Liver Function Tests:  Recent Labs Lab 12/02/16 1405 12/03/16 0359  AST 23 19  ALT 33 28  ALKPHOS 71 59  BILITOT 1.1 1.0  PROT 6.3* 5.7*  ALBUMIN 3.1* 2.6*   No results for input(s): LIPASE, AMYLASE in the last 168 hours. No results for input(s): AMMONIA in the last 168 hours. CBC:  Recent Labs Lab 12/02/16 1405 12/03/16 0359 12/04/16 0340 12/05/16 0321 12/06/16 0334 12/08/16 0554  WBC 18.0* 21.3* 15.2* 17.3* 14.1* 16.9*  NEUTROABS 14.7*  --   --   --   --   --   HGB 13.0 11.8* 11.8* 12.4* 11.3* 12.7*  HCT 39.3 36.8* 36.6* 38.8* 34.9* 39.7  MCV 80.2 79.8  79.0 80.7 80.0 80.7  PLT 374 315 333 382 332 380   Cardiac Enzymes: No results for input(s): CKTOTAL, CKMB, CKMBINDEX, TROPONINI in the last 168 hours. CBG:  Recent Labs Lab 12/07/16 1206 12/07/16 1708 12/07/16 2133 12/08/16 0907 12/08/16 1149  GLUCAP 179* 226* 211* 171* 219*    Iron Studies: No results for input(s): IRON, TIBC, TRANSFERRIN, FERRITIN in the last 72 hours. Studies/Results: No results found. . carvedilol  25 mg Oral BID WC  . colchicine  0.6 mg Oral BID  . diltiazem  300 mg Oral Daily  . gabapentin  300 mg Oral TID  . insulin aspart  0-9 Units Subcutaneous TID WC  . insulin aspart  6 Units Subcutaneous TID WC  . insulin glargine  35 Units Subcutaneous QHS  . metolazone  5 mg Oral Daily  .  rivaroxaban  20 mg Oral Q supper  . sodium chloride flush  3 mL Intravenous Q12H    BMET    Component Value Date/Time   NA 130 (L) 12/08/2016 0554   K 3.5 12/08/2016 0554   CL 84 (L) 12/08/2016 0554   CO2 32 12/08/2016 0554   GLUCOSE 209 (H) 12/08/2016 0554   BUN 99 (H) 12/08/2016 0554   CREATININE 2.79 (H) 12/08/2016 0554   CALCIUM 8.5 (L) 12/08/2016 0554   GFRNONAA 26 (L) 12/08/2016 0554   GFRAA 30 (L) 12/08/2016 0554   CBC    Component Value Date/Time   WBC 16.9 (H) 12/08/2016 0554   RBC 4.92 12/08/2016 0554   HGB 12.7 (L) 12/08/2016 0554   HCT 39.7 12/08/2016 0554   PLT 380 12/08/2016 0554   MCV 80.7 12/08/2016 0554   MCH 25.8 (L) 12/08/2016 0554   MCHC 32.0 12/08/2016 0554   RDW 16.4 (H) 12/08/2016 0554   LYMPHSABS 2.2 12/02/2016 1405   MONOABS 1.1 (H) 12/02/2016 1405   EOSABS 0.0 12/02/2016 1405   BASOSABS 0.0 12/02/2016 1405     Assessment/Plan:  1. AKI/CKD stage 4 in setting of decompensated CHF.  Scr continues to slowly trend toward baseline of 2.5.  Responding to IV lasxi and po zaroxolyn.  Stressed fluid and sodium restriction. 2. Volume overload/anasarca- as above 1. Increase lasix to tid since I's/O's without significant  change. 3. NICM- EF 35% 4. A fib with RVR- per cardiology 5. DM type 2 on insulin 6. Obesity 7. HTN- stable Donetta Potts, MD [employer] [employer] [phone]

## 2016-12-08 NOTE — Progress Notes (Signed)
PROGRESS NOTE    John Bailey  WFU:[id number] DOB: 11-26-1970 DOA: 12/02/2016 PCP: [name], MD  Brief Narrative:46 y.o. male with a hx of diabetes, hypertension, atrial fibrillation on Xarelto, chronic systolic and diastolic CHF,  CKD4  admitted with severe vol overload, AKI and Afib RVR. Improving with diuresis, was on cardizem gtt now off Renal and Cards following  Assessment & Plan:    Acute combined systolic (congestive) and diastolic (congestive) heart failure (HCC) -with profound edema, felt to be 20-30lbs above dry weight on admission -last ECHO with improvement in EF from 40 to 55% and grade 2DD -NICM due to hypertension -see below, 5.2L negative -ambulate, OOB -PT eval -weight not consistently improved, ? accuracy    AKi on CKD4 -baseline creatinine around 2.5 -due to DM and hypertensive nephropathy and now with cardiorenal syndrome -continue lasix high dose IV lasix per Renal -Renal consult greatly appreciated -creatinine had plateaued at 3.3, now slightly better -weight, negative 5.1, weight very slow to come off  Secondary LLE cellulitis -improving, continue Doxy Day 4 -monitor -change to Po abx soon    Paroxysmal atrial fibrillation (HCC) -with RVR on cardizem gtt -chadsvasc score is 5, on xarelto, may need to be switched to coumadin if creatinine worsens -rate improving, changed to PO cardizem  -HR better   HTN -BP stable    Insulin-dependent diabetes mellitus with renal complications ([place]) -on lantus at home, now SSI  CBGS better now on increased lantus and meal coverage  DVT prophylaxis: xarelto Code Status: Full Code Family Communication: none at bedside Disposition Plan: home when adequately diuresed Consultants:   Cards  Renal   Subjective: -continues to feel better, LLE improving, breathing improved Objective: Vitals:   12/07/16 1346 12/07/16 2135 12/08/16 0500 12/08/16 0517  BP: (!) 152/105 126/90  121/86  Pulse: (!) 110  (!) 101  99  Resp: 18 20  18   Temp: 98.2 F (36.8 C) 98.8 F (37.1 C)  98.3 F (36.8 C)  TempSrc: Oral Oral  Oral  SpO2: 100% 100%  97%  Weight:   (!) 144.2 kg (318 lb)   Height:        Intake/Output Summary (Last 24 hours) at 12/08/16 1321 Last data filed at 12/08/16 1004  Gross per 24 hour  Intake             1065 ml  Output              950 ml  Net              115 ml   Filed Weights   12/06/16 0800 12/07/16 0500 12/08/16 0500  Weight: (!) 143.8 kg (317 lb 0.3 oz) (!) 143.7 kg (316 lb 12.8 oz) (!) 144.2 kg (318 lb)    Examination:  Gen: Awake, Alert, Oriented X 3, obese, no distress HEENT: PERRLA, Neck supple, no JVD Lungs: Good air movement bilaterally, CTAB CVS: S1s2/Irregular, no rubs or new Murmurs Abd: soft, Non tender, non distended, BS present Extremities: 2plus edema, erythema and tenderness on LLE imrpoving Skin: no new rashes    Data Reviewed:   CBC:  Recent Labs Lab 12/02/16 1405 12/03/16 0359 12/04/16 0340 12/05/16 0321 12/06/16 0334 12/08/16 0554  WBC 18.0* 21.3* 15.2* 17.3* 14.1* 16.9*  NEUTROABS 14.7*  --   --   --   --   --   HGB 13.0 11.8* 11.8* 12.4* 11.3* 12.7*  HCT 39.3 36.8* 36.6* 38.8* 34.9* 39.7  MCV 80.2 79.8 79.0  80.7 80.0 80.7  PLT 374 315 333 382 332 259   Basic Metabolic Panel:  Recent Labs Lab 12/02/16 1405  12/04/16 0340 12/05/16 0321 12/06/16 0334 12/07/16 0749 12/08/16 0554  NA 131*  < > 129* 130* 127* 131* 130*  K 3.6  < > 3.1* 3.8 3.2* 3.4* 3.5  CL 87*  < > 84* 82* 83* 85* 84*  CO2 29  < > 29 32 31 32 32  GLUCOSE 233*  < > 297* 271* 347* 155* 209*  BUN 115*  < > 109* 119* 112* 101* 99*  CREATININE 3.12*  < > 3.34* 3.28* 3.32* 3.02* 2.79*  CALCIUM 8.8*  < > 8.2* 8.2* 7.7* 8.7* 8.5*  MG 1.7  --  1.5*  --   --   --   --   < > = values in this interval not displayed. GFR: Estimated Creatinine Clearance: 49.2 mL/min (A) (by C-G formula based on SCr of 2.79 mg/dL (H)). Liver Function Tests:  Recent Labs Lab  12/02/16 1405 12/03/16 0359  AST 23 19  ALT 33 28  ALKPHOS 71 59  BILITOT 1.1 1.0  PROT 6.3* 5.7*  ALBUMIN 3.1* 2.6*   No results for input(s): LIPASE, AMYLASE in the last 168 hours. No results for input(s): AMMONIA in the last 168 hours. Coagulation Profile: No results for input(s): INR, PROTIME in the last 168 hours. Cardiac Enzymes: No results for input(s): CKTOTAL, CKMB, CKMBINDEX, TROPONINI in the last 168 hours. BNP (last 3 results) No results for input(s): PROBNP in the last 8760 hours. HbA1C: No results for input(s): HGBA1C in the last 72 hours. CBG:  Recent Labs Lab 12/07/16 1206 12/07/16 1708 12/07/16 2133 12/08/16 0907 12/08/16 1149  GLUCAP 179* 226* 211* 171* 219*   Lipid Profile: No results for input(s): CHOL, HDL, LDLCALC, TRIG, CHOLHDL, LDLDIRECT in the last 72 hours. Thyroid Function Tests: No results for input(s): TSH, T4TOTAL, FREET4, T3FREE, THYROIDAB in the last 72 hours. Anemia Panel: No results for input(s): VITAMINB12, FOLATE, FERRITIN, TIBC, IRON, RETICCTPCT in the last 72 hours. Urine analysis:    Component Value Date/Time   COLORURINE YELLOW 12/02/2016 1518   APPEARANCEUR CLEAR 12/02/2016 1518   LABSPEC 1.008 12/02/2016 1518   PHURINE 5.0 12/02/2016 1518   GLUCOSEU NEGATIVE 12/02/2016 1518   HGBUR NEGATIVE 12/02/2016 1518   BILIRUBINUR NEGATIVE 12/02/2016 1518   KETONESUR NEGATIVE 12/02/2016 1518   PROTEINUR 30 (A) 12/02/2016 1518   NITRITE NEGATIVE 12/02/2016 1518   LEUKOCYTESUR NEGATIVE 12/02/2016 1518   Sepsis Labs: @LABRCNTIP (procalcitonin:4,lacticidven:4)  ) Recent Results (from the past 240 hour(s))  Culture, blood (Routine X 2) w Reflex to ID Panel     Status: None   Collection Time: 12/02/16  2:05 PM  Result Value Ref Range Status   Specimen Description BLOOD LEFT ANTECUBITAL  Final   Special Requests   Final    BOTTLES DRAWN AEROBIC AND ANAEROBIC Blood Culture results may not be optimal due to an inadequate volume of  blood received in culture bottles   Culture   Final    NO GROWTH 5 DAYS Performed at [facility], [place] [address], [place], [place] [address]    Report Status 12/07/2016 FINAL  Final  Culture, blood (Routine X 2) w Reflex to ID Panel     Status: None   Collection Time: 12/02/16  2:07 PM  Result Value Ref Range Status   Specimen Description BLOOD RIGHT ANTECUBITAL  Final   Special Requests   Final  BOTTLES DRAWN AEROBIC AND ANAEROBIC Blood Culture results may not be optimal due to an inadequate volume of blood received in culture bottles   Culture   Final    NO GROWTH 5 DAYS Performed at [facility], [place] [address] [place], [place] [address]    Report Status 12/07/2016 FINAL  Final  MRSA PCR Screening     Status: None   Collection Time: 12/02/16 10:01 PM  Result Value Ref Range Status   MRSA by PCR NEGATIVE NEGATIVE Final    Comment:        The GeneXpert MRSA Assay (FDA approved for NASAL specimens only), is one component of a comprehensive MRSA colonization surveillance program. It is not intended to diagnose MRSA infection nor to guide or monitor treatment for MRSA infections.          Radiology Studies: No results found.      Scheduled Meds: . carvedilol  25 mg Oral BID WC  . colchicine  0.6 mg Oral BID  . diltiazem  300 mg Oral Daily  . gabapentin  300 mg Oral TID  . insulin aspart  0-9 Units Subcutaneous TID WC  . insulin aspart  6 Units Subcutaneous TID WC  . insulin glargine  35 Units Subcutaneous QHS  . metolazone  5 mg Oral Daily  . rivaroxaban  20 mg Oral Q supper  . sodium chloride flush  3 mL Intravenous Q12H   Continuous Infusions: . sodium chloride    . doxycycline (VIBRAMYCIN) IV Stopped (12/08/16 1204)  . furosemide    . sodium chloride    . sodium chloride       LOS: 6 days    Time spent: 31min    Domenic Polite, MD [facility] Pager [phone]  If 7PM-7AM, please contact  night-coverage [email or web address] Password [email or web address] 12/08/2016, 1:21 PM

## 2016-12-08 NOTE — Progress Notes (Signed)
Progress Note  Patient Name: John Bailey Date of Encounter: 12/08/2016  Primary Cardiologist: Dr. Debara Pickett  Subjective   No chest pain and no SOB feeling well   Inpatient Medications    Scheduled Meds: . carvedilol  25 mg Oral BID WC  . colchicine  0.6 mg Oral BID  . diltiazem  300 mg Oral Daily  . gabapentin  300 mg Oral TID  . insulin aspart  0-9 Units Subcutaneous TID WC  . insulin aspart  6 Units Subcutaneous TID WC  . insulin glargine  35 Units Subcutaneous QHS  . metolazone  5 mg Oral Daily  . rivaroxaban  20 mg Oral Q supper  . sodium chloride flush  3 mL Intravenous Q12H   Continuous Infusions: . sodium chloride    . doxycycline (VIBRAMYCIN) IV Stopped (12/07/16 2336)  . furosemide Stopped (12/08/16 3329)  . sodium chloride    . sodium chloride     PRN Meds: sodium chloride, acetaminophen **OR** acetaminophen, albuterol, levalbuterol, lip balm, ondansetron **OR** ondansetron (ZOFRAN) IV, oxyCODONE, sodium chloride flush   Vital Signs    Vitals:   12/07/16 1346 12/07/16 2135 12/08/16 0500 12/08/16 0517  BP: (!) 152/105 126/90  121/86  Pulse: (!) 110 (!) 101  99  Resp: 18 20  18   Temp: 98.2 F (36.8 C) 98.8 F (37.1 C)  98.3 F (36.8 C)  TempSrc: Oral Oral  Oral  SpO2: 100% 100%  97%  Weight:   (!) 318 lb (144.2 kg)   Height:        Intake/Output Summary (Last 24 hours) at 12/08/16 0936 Last data filed at 12/08/16 0600  Gross per 24 hour  Intake             1355 ml  Output             1275 ml  Net               80 ml   Filed Weights   12/06/16 0800 12/07/16 0500 12/08/16 0500  Weight: (!) 317 lb 0.3 oz (143.8 kg) (!) 316 lb 12.8 oz (143.7 kg) (!) 318 lb (144.2 kg)    Telemetry    A fib , rate < 100 in night but this AM 105 to 110 overall improved - Personally Reviewed  ECG    No new - Personally Reviewed  Physical Exam   GEN: No acute distress.   Neck: No JVD Cardiac: irreg irreg , no murmurs, rubs, or gallops.  Respiratory: Clear  to auscultation bilaterally. GI: Soft, nontender, non-distended but still tight with fluid MS: 3-4+ edema but improved legs not as tight, feet still very swollen, lt leg wrapped. ; No deformity. Neuro:  Nonfocal  Psych: Normal affect   Labs    Chemistry Recent Labs Lab 12/02/16 1405 12/03/16 0359  12/06/16 0334 12/07/16 0749 12/08/16 0554  NA 131* 130*  < > 127* 131* 130*  K 3.6 4.2  < > 3.2* 3.4* 3.5  CL 87* 85*  < > 83* 85* 84*  CO2 29 29  < > 31 32 32  GLUCOSE 233* 319*  < > 347* 155* 209*  BUN 115* 128*  < > 112* 101* 99*  CREATININE 3.12* 3.25*  < > 3.32* 3.02* 2.79*  CALCIUM 8.8* 8.3*  < > 7.7* 8.7* 8.5*  PROT 6.3* 5.7*  --   --   --   --   ALBUMIN 3.1* 2.6*  --   --   --   --  AST 23 19  --   --   --   --   ALT 33 28  --   --   --   --   ALKPHOS 71 59  --   --   --   --   BILITOT 1.1 1.0  --   --   --   --   GFRNONAA 23* 22*  < > 21* 24* 26*  GFRAA 26* 25*  < > 24* 27* 30*  ANIONGAP 15 16*  < > 13 14 14   < > = values in this interval not displayed.   Hematology Recent Labs Lab 12/05/16 0321 12/06/16 0334 12/08/16 0554  WBC 17.3* 14.1* 16.9*  RBC 4.81 4.36 4.92  HGB 12.4* 11.3* 12.7*  HCT 38.8* 34.9* 39.7  MCV 80.7 80.0 80.7  MCH 25.8* 25.9* 25.8*  MCHC 32.0 32.4 32.0  RDW 16.2* 16.2* 16.4*  PLT 382 332 380    Cardiac EnzymesNo results for input(s): TROPONINI in the last 168 hours.  Recent Labs Lab 12/02/16 1416  TROPIPOC 0.26*     BNP Recent Labs Lab 12/02/16 1405  BNP 554.8*     DDimer No results for input(s): DDIMER in the last 168 hours.   Radiology    No results found.  Cardiac Studies   Echo 06/2016 Study Conclusions  - Left ventricle: The cavity size is small. 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 Systolic function was normal. The estimated ejection fraction was in the range of 50% to 55%. Distal anterior/apical hypokinesis seen on Definity  contrast images. Doppler parameters are consistent with restrictive physiology (grade 3 diastolic dysfunction). The E/A ratio is >2.5. The E/e&' ratio is >20, suggesting markedly elevated LV filling pressure. - Mitral valve: Mitral B-notch noted, suggesting elevated LA pressure. - Left atrium: Moderately dilated. - Right ventricle: The cavity size was mildly dilated. Systolic function was normal. - Right atrium: Severely dilated. - Tricuspid valve: There was trivial regurgitation. - Pulmonary arteries: PA peak pressure: 19 mm Hg (S). - Inferior vena cava: The vessel was normal in size. The respirophasic diameter changes were in the normal range (>= 50%), consistent with normal central venous pressure. - Pericardium, extracardiac: Small pericardial effusion. Features were not consistent with tamponade physiology.  Impressions:  - Compared to a prior study in 2017, the LVEF has increased to 50-55% with distal anterior/apical hypokinesis as seen on Definity contrast images.   Patient Profile     46 y.o. male with chronic combined systolic and diastolic congestive heart failure. He also has chronic kidney disease admitted with decompensated CHF and back in a fib..    Assessment & Plan    1. Acute on chronic systolic and diastolic HF- last EF in 04/5425 50-55%  G3 DD (EF 2017 25-30%) , BNP on admit 554  Pt with hx non compliance now diuresing neg 5,252 since admit and wt down from 320 lbs to 318 lbs. though different scales may be reason for slight increase.   Now on metolazone 5 mg daily  And IV lasix 160 mg every 12 hours. --Cr 2.79 ; 3.32 up from 3.28 and on admit 3.12.   2. Persistent  a fib ( SR in Jan 2018 EKG and in May by exam) on admit rapid a fib at 198 with RBBB) rate well controlled now, on coreg 25 BID, dilt CD 300 mg daily  -? TEE DCCV once volume improved.  3.  Anticoagulation  on Xarelto, CHA2DS2VASc is 3  4. CKD 4- renal following see  above  5. Hyponatremia now 130 up from 127 yesterday  6.  Hx NICM with recurrent a fib and edema EF 50-55% in April    7.  DM insulin dependent per IM   8. Hypokalemia replace today at 3.5   For questions or updates, please contact [place] Please consult [email or web address] for contact info under Cardiology/STEMI.      Signed, Cecilie Kicks, NP  12/08/2016, 9:36 AM     History and all data above reviewed.  Patient examined.  I agree with the findings as above.   The patient exam reveals [id number]  ,  Lungs:  Clear  ,  Abd:  Obese , Ext Severe edema improved  .  All available labs, radiology testing, previous records reviewed. Agree with documented assessment and plan. ANASARCA:  It looks like he is starting to have improvement in his volume status.  I think the I/Os are incomplete.  He is on high dose diuretic and I would continue with this today.  I suspect he will start to mobilize even more fluid in the days to come.    Jeneen Rinks Baila Rouse  12:19 PM  12/08/2016

## 2016-12-09 DIAGNOSIS — N179 Acute kidney failure, unspecified: Secondary | ICD-10-CM

## 2016-12-09 LAB — GLUCOSE, CAPILLARY
GLUCOSE-CAPILLARY: 108 mg/dL — AB (ref 65–99)
GLUCOSE-CAPILLARY: 119 mg/dL — AB (ref 65–99)
GLUCOSE-CAPILLARY: 84 mg/dL (ref 65–99)
GLUCOSE-CAPILLARY: 147 mg/dL — AB (ref 65–99)
Glucose-Capillary: 52 mg/dL — ABNORMAL LOW (ref 65–99)

## 2016-12-09 MED ORDER — RIVAROXABAN 15 MG PO TABS
15.0000 mg | ORAL_TABLET | Freq: Every day | ORAL | Status: DC
  Administered 2016-12-09 – 2016-12-15 (×7): 15 mg via ORAL
  Filled 2016-12-09 (×7): qty 1

## 2016-12-09 MED ORDER — INSULIN GLARGINE 100 UNIT/ML ~~LOC~~ SOLN
30.0000 [IU] | Freq: Every day | SUBCUTANEOUS | Status: DC
  Administered 2016-12-09 – 2016-12-13 (×5): 30 [IU] via SUBCUTANEOUS
  Filled 2016-12-09 (×6): qty 0.3

## 2016-12-09 NOTE — Progress Notes (Signed)
Hypoglycemic Event  CBG:52  Treatment: 15 GM carbohydrate snack  Symptoms: None  Follow-up CBG: Time:0821 CBG Result:108  Possible Reasons for Event: unknown  Comments/MD notified:Chiu    John Bailey, John Bailey

## 2016-12-09 NOTE — Progress Notes (Signed)
Progress Note  Patient Name: John Bailey Date of Encounter: 12/09/2016  Primary Cardiologist: Dr. Debara Pickett  Subjective   Pt denies chest pain, palpitations, and dizziness. Still with LE edema.  Inpatient Medications    Scheduled Meds: . carvedilol  25 mg Oral BID WC  . colchicine  0.6 mg Oral BID  . diltiazem  300 mg Oral Daily  . gabapentin  300 mg Oral TID  . insulin aspart  0-9 Units Subcutaneous TID WC  . insulin aspart  6 Units Subcutaneous TID WC  . insulin glargine  35 Units Subcutaneous QHS  . metolazone  5 mg Oral Daily  . rivaroxaban  20 mg Oral Q supper  . sodium chloride flush  3 mL Intravenous Q12H   Continuous Infusions: . sodium chloride    . doxycycline (VIBRAMYCIN) IV 100 mg (12/09/16 0847)  . furosemide Stopped (12/08/16 2342)  . sodium chloride    . sodium chloride     PRN Meds: sodium chloride, acetaminophen **OR** acetaminophen, albuterol, levalbuterol, lip balm, menthol-cetylpyridinium, ondansetron **OR** ondansetron (ZOFRAN) IV, oxyCODONE, sodium chloride flush   Vital Signs    Vitals:   12/08/16 2105 12/09/16 0500 12/09/16 0506 12/09/16 0511  BP: 104/80  117/70 (!) 122/56  Pulse: 98  (!) 111 88  Resp: 18  18 18   Temp: 98.4 F (36.9 C)  99.2 F (37.3 C) 98.6 F (37 C)  TempSrc: Oral  Oral Oral  SpO2: 98%  99% 98%  Weight:  (!) 312 lb 2.7 oz (141.6 kg)    Height:        Intake/Output Summary (Last 24 hours) at 12/09/16 0946 Last data filed at 12/09/16 0600  Gross per 24 hour  Intake             1192 ml  Output             2675 ml  Net            -1483 ml   Filed Weights   12/07/16 0500 12/08/16 0500 12/09/16 0500  Weight: (!) 316 lb 12.8 oz (143.7 kg) (!) 318 lb (144.2 kg) (!) 312 lb 2.7 oz (141.6 kg)     Physical Exam   General: Well developed, well nourished, male appearing in no acute distress. Head: Normocephalic, atraumatic.  Neck: Supple without bruits, no JVD but exam difficult Lungs:  Resp regular and unlabored,  CTA. Heart: Irregular rhythm, tachycardic rate, no murmur; no rub, diminished in bases Abdomen: Soft, non-tender, non-distended with normoactive bowel sounds. No hepatomegaly. No rebound/guarding. No obvious abdominal masses. Extremities: No clubbing, cyanosis, at least 2+ edema, left leg wrapped for weeping. Distal pedal pulses are faint bilaterally 2/ edema, extremities warm. Neuro: Alert and oriented X 3. Moves all extremities spontaneously. Psych: Normal affect.  Labs    Chemistry Recent Labs Lab 12/02/16 1405 12/03/16 0359  12/06/16 0334 12/07/16 0749 12/08/16 0554  NA 131* 130*  < > 127* 131* 130*  K 3.6 4.2  < > 3.2* 3.4* 3.5  CL 87* 85*  < > 83* 85* 84*  CO2 29 29  < > 31 32 32  GLUCOSE 233* 319*  < > 347* 155* 209*  BUN 115* 128*  < > 112* 101* 99*  CREATININE 3.12* 3.25*  < > 3.32* 3.02* 2.79*  CALCIUM 8.8* 8.3*  < > 7.7* 8.7* 8.5*  PROT 6.3* 5.7*  --   --   --   --   ALBUMIN 3.1* 2.6*  --   --   --   --  AST 23 19  --   --   --   --   ALT 33 28  --   --   --   --   ALKPHOS 71 59  --   --   --   --   BILITOT 1.1 1.0  --   --   --   --   GFRNONAA 23* 22*  < > 21* 24* 26*  GFRAA 26* 25*  < > 24* 27* 30*  ANIONGAP 15 16*  < > 13 14 14   < > = values in this interval not displayed.   Hematology Recent Labs Lab 12/05/16 0321 12/06/16 0334 12/08/16 0554  WBC 17.3* 14.1* 16.9*  RBC 4.81 4.36 4.92  HGB 12.4* 11.3* 12.7*  HCT 38.8* 34.9* 39.7  MCV 80.7 80.0 80.7  MCH 25.8* 25.9* 25.8*  MCHC 32.0 32.4 32.0  RDW 16.2* 16.2* 16.4*  PLT 382 332 380    Cardiac EnzymesNo results for input(s): TROPONINI in the last 168 hours.  Recent Labs Lab 12/02/16 1416  TROPIPOC 0.26*     BNP Recent Labs Lab 12/02/16 1405  BNP 554.8*     DDimer No results for input(s): DDIMER in the last 168 hours.   Radiology    No results found.   Telemetry    Afib in the 100s - Personally Reviewed  ECG    No new tracings - Personally Reviewed   Cardiac Studies    Echo 06/2016 Study Conclusions - Left ventricle: The cavity size is small. 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 Systolic function was normal. The estimated ejection fraction was in the range of 50% to 55%. Distal anterior/apical hypokinesis seen on Definity contrast images. Doppler parameters are consistent with restrictive physiology (grade 3 diastolic dysfunction). The E/A ratio is >2.5. The E/e&' ratio is >20, suggesting markedly elevated LV filling pressure. - Mitral valve: Mitral B-notch noted, suggesting elevated LA pressure. - Left atrium: Moderately dilated. - Right ventricle: The cavity size was mildly dilated. Systolic function was normal. - Right atrium: Severely dilated. - Tricuspid valve: There was trivial regurgitation. - Pulmonary arteries: PA peak pressure: 19 mm Hg (S). - Inferior vena cava: The vessel was normal in size. The respirophasic diameter changes were in the normal range (>= 50%), consistent with normal central venous pressure. - Pericardium, extracardiac: Small pericardial effusion. Features were not consistent with tamponade physiology.  Impressions: - Compared to a prior study in 2017, the LVEF has increased to 50-55% with distal anterior/apical hypokinesis as seen on Definity contrast images.   Patient Profile     46 y.o. male with chronic combined systolic and diastolic congestive heart failure. He also has chronic kidney disease admitted with decompensated CHF and back in a fib.  Assessment & Plan    1. Acute on chronic systolic and diastolic heart failure, NICM - BNP on admission 554 with CKD stage 4 - diuresing on 160 mg IV TID - overall net negative 6.7 L with 2.6 L urine output yesterday - weight 312 lbs, peaked at 326 lbs on 9/23   2. Persistent Afib - previously sinus rhythm Jan 2018 (EKG) and May 2018 (exam) - presented with  Afib RVR with RBBB - now rate controlled in the 100s - continue coreg 25 mg BID, diltiazem 300 mg daily - consider TEE/DCCV when euvolemic - This patients CHA2DS2-VASc Score and unadjusted Ischemic Stroke Rate (% per year) is equal to 3.2 %  stroke rate/year from a score of 3 (HTN, CHF, DM) - continue xarelto  3. CKD stage 4 - sCr 2.79 (3.02), baseline appears to be 2.26 (2017) -per nephrology - continue diuresing  4. Hyponatremia - stable at 130  5. IDDM - per primary team - A1c 14.5 (12/02/16)  6. Hypokalemia - K 3.5 (3.4)   Signed, Ledora Bottcher , PA-C 9:46 AM 12/09/2016 Pager: [phone]  History and all data above reviewed.  Patient examined.  I agree with the findings as above.  He denies any SOB or pain.  The patient exam reveals COR:  Irregular,  Lungs: Clear   ,  Abd: Positive bowel sounds, no rebound no guarding, Ext severe edema improved  .  All available labs, radiology testing, previous records reviewed. Agree with documented assessment and plan. ANASARCA:  Improved.  Continue current diuresis.  ATRIAL FIB:  Dose adjust Xarelto to 15 mg daily.      Jeneen Rinks [place]ely [place]f  12:51 PM  12/09/2016

## 2016-12-09 NOTE — Progress Notes (Signed)
Received consult for pt interested in applying for disability- spoke with pt and advised application process can be completed at [place] office or initial application can be done online. Pt thanked CSW and states he will work with OP provider on this upon DC.  Sharren Bridge, MSW, LCSW Clinical Social Work 12/09/2016 [phone]

## 2016-12-09 NOTE — Progress Notes (Signed)
PROGRESS NOTE    John Bailey  UXL:[id number] DOB: [birth year] DOA: 12/02/2016 PCP: [name], MD    Brief Narrative:  46 y.o.malewith a hx of diabetes, hypertension, atrial fibrillation on Xarelto, chronic systolic and diastolic CHF,  CKD4 admitted with severe vol overload, AKI and Afib RVR. 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 -Continue to encourage ambulation, OOB -Physical therapy consulted    AKi on CKD4 -baseline creatinine around 2.5 -due to DM and hypertensive nephropathy and now with cardiorenal syndrome -Patient is continued on high dose IV lasix per Renal -Appreciate input by nephrology -creatinine had plateaued at 3.3, labs reviewed, creatinine improving -Continue to monitor I's and O's and daily weights.  Secondary LLE cellulitis -improving, continue on doxycycline as tolerated -monitor -Afebrile    Paroxysmal atrial fibrillation (HCC) -with RVR on cardizem gtt -chadsvasc score is 5, on xarelto, may need to be switched to coumadin if creatinine worsens -rate improving, changed to PO cardizem  -Per cardiology, consideration for TEE with cardioversion when volume status improved   HTN -BP stable at this time   Insulin-dependent diabetes mellitus with renal complications ([place]) -on lantus at home, now SSI  CBGS better now on increased lantus and meal coverage -Continue titrate insulin as needed with goal of euglycemia  DVT prophylaxis: Xarelto Code Status: Full Family Communication: Pt in room,family not at bedside Disposition Plan: Uncertain at this time  Consultants:   Cardiology  Nephrology  Procedures:     Antimicrobials: Anti-infectives    Start     Dose/Rate Route Frequency Ordered Stop   12/05/16 0800  doxycycline (VIBRAMYCIN) 100 mg in dextrose 5 % 250 mL IVPB     100 mg 125 mL/hr over 120 Minutes Intravenous Every 12 hours 12/05/16 0755     12/02/16 2000  piperacillin-tazobactam (ZOSYN) IVPB 3.375 g  Status:  Discontinued     3.375 g 12.5 mL/hr over 240 Minutes Intravenous Every 8 hours 12/02/16 1724 12/03/16 0756   12/02/16 1800  clindamycin (CLEOCIN) IVPB 900 mg  Status:  Discontinued     900 mg 100 mL/hr over 30 Minutes Intravenous Every 8 hours 12/02/16 1707 12/03/16 0756   12/02/16 1430  piperacillin-tazobactam (ZOSYN) IVPB 3.375 g     3.375 g 100 mL/hr over 30 Minutes Intravenous  Once 12/02/16 1424 12/02/16 1517   12/02/16 1430  vancomycin (VANCOCIN) IVPB 1000 mg/200 mL premix     1,000 mg 200 mL/hr over 60 Minutes Intravenous  Once 12/02/16 1424 12/02/16 1704       Subjective: Reports feeling better today  Objective: Vitals:   12/09/16 0500 12/09/16 0506 12/09/16 0511 12/09/16 1457  BP:  117/70 (!) 122/56 120/79  Pulse:  (!) 111 88 89  Resp:  18 18 18   Temp:  99.2 F (  37.3 C) 98.6 F (37 C) 98.3 F (36.8 C)  TempSrc:  Oral Oral Oral  SpO2:  99% 98% 92%  Weight: (!) 141.6 kg (312 lb 2.7 oz)     Height:        Intake/Output Summary (Last 24 hours) at 12/09/16 1516 Last data filed at 12/09/16 1458  Gross per 24 hour  Intake             1192 ml  Output             1825 ml  Net             -633 ml   Filed Weights   12/07/16 0500 12/08/16 0500  12/09/16 0500  Weight: (!) 143.7 kg (316 lb 12.8 oz) (!) 144.2 kg (318 lb) (!) 141.6 kg (312 lb 2.7 oz)    Examination:  General exam: Appears calm and comfortable  Respiratory system: Clear to auscultation. Respiratory effort normal. Cardiovascular system: S1 & S2 heard, RRR Gastrointestinal system: Abdomen is nondistended, soft and nontender. No organomegaly or masses felt. Normal bowel sounds heard. Central nervous system: Alert and oriented. No focal neurological deficits. Extremities: Symmetric 5 x 5 power. Skin: No rashes, lesions  Psychiatry: Judgement and insight appear normal. Mood & affect appropriate.   Data Reviewed: I have personally reviewed following labs and imaging studies  CBC:  Recent Labs Lab 12/03/16 0359 12/04/16 0340 12/05/16 0321 12/06/16 0334 12/08/16 0554  WBC 21.3* 15.2* 17.3* 14.1* 16.9*  HGB 11.8* 11.8* 12.4* 11.3* 12.7*  HCT 36.8* 36.6* 38.8* 34.9* 39.7  MCV 79.8 79.0 80.7 80.0 80.7  PLT 315 333 382 332 751   Basic Metabolic Panel:  Recent Labs Lab 12/04/16 0340 12/05/16 0321 12/06/16 0334 12/07/16 0749 12/08/16 0554  NA 129* 130* 127* 131* 130*  K 3.1* 3.8 3.2* 3.4* 3.5  CL 84* 82* 83* 85* 84*  CO2 29 32 31 32 32  GLUCOSE 297* 271* 347* 155* 209*  BUN 109* 119* 112* 101* 99*  CREATININE 3.34* 3.28* 3.32* 3.02* 2.79*  CALCIUM 8.2* 8.2* 7.7* 8.7* 8.5*  MG 1.5*  --   --   --   --    GFR: Estimated Creatinine Clearance: 48.6 mL/min (A) (by C-G formula based on SCr of 2.79 mg/dL (H)). Liver Function Tests:  Recent Labs Lab 12/03/16 0359  AST 19  ALT 28  ALKPHOS 59  BILITOT 1.0  PROT 5.7*  ALBUMIN 2.6*   No results for input(s): LIPASE, AMYLASE in the last 168 hours. No results for input(s): AMMONIA in the last 168 hours. Coagulation Profile: No results for input(s): INR, PROTIME in the last 168 hours. Cardiac Enzymes: No results for input(s): CKTOTAL, CKMB, CKMBINDEX, TROPONINI in the last 168 hours. BNP (last 3  results) No results for input(s): PROBNP in the last 8760 hours. HbA1C: No results for input(s): HGBA1C in the last 72 hours. CBG:  Recent Labs Lab 12/08/16 1626 12/08/16 2103 12/09/16 0731 12/09/16 0821 12/09/16 1146  GLUCAP 187* 194* 52* 108* 119*   Lipid Profile: No results for input(s): CHOL, HDL, LDLCALC, TRIG, CHOLHDL, LDLDIRECT in the last 72 hours. Thyroid Function Tests: No results for input(s): TSH, T4TOTAL, FREET4, T3FREE, THYROIDAB in the last 72 hours. Anemia Panel: No results for input(s): VITAMINB12, FOLATE, FERRITIN, TIBC, IRON, RETICCTPCT in the last 72 hours. Sepsis Labs:  Recent Labs Lab 12/02/16 1817  LATICACIDVEN 2.7*    Recent Results (from the past 240 hour(s))  Culture, blood (Routine X 2) w Reflex  to ID Panel     Status: None   Collection Time: 12/02/16  2:05 PM  Result Value Ref Range Status   Specimen Description BLOOD LEFT ANTECUBITAL  Final   Special Requests   Final    BOTTLES DRAWN AEROBIC AND ANAEROBIC Blood Culture results may not be optimal due to an inadequate volume of blood received in culture bottles   Culture   Final    NO GROWTH 5 DAYS Performed at [facility], [place] [address], [place], [place] [address]    Report Status 12/07/2016 FINAL  Final  Culture, blood (Routine X 2) w Reflex to ID Panel     Status: None   Collection Time: 12/02/16  2:07 PM  Result Value Ref Range Status   Specimen Description BLOOD RIGHT ANTECUBITAL  Final   Special Requests   Final    BOTTLES DRAWN AEROBIC AND ANAEROBIC Blood Culture results may not be optimal due to an inadequate volume of blood received in culture bottles   Culture   Final    NO GROWTH 5 DAYS Performed at [facility], [place] [address] [place],  [address]    Report Status 12/07/2016 FINAL  Final  MRSA PCR Screening     Status: None   Collection Time: 12/02/16 10:01 PM  Result Value Ref Range Status   MRSA by PCR NEGATIVE NEGATIVE Final    Comment:        The  GeneXpert MRSA Assay (FDA approved for NASAL specimens only), is one component of a comprehensive MRSA colonization surveillance program. It is not intended to diagnose MRSA infection nor to guide or monitor treatment for MRSA infections.      Radiology Studies: No results found.  Scheduled Meds: . carvedilol  25 mg Oral BID WC  . colchicine  0.6 mg Oral BID  . diltiazem  300 mg Oral Daily  . gabapentin  300 mg Oral TID  . insulin aspart  0-9 Units Subcutaneous TID WC  . insulin aspart  6 Units Subcutaneous TID WC  . insulin glargine  30 Units Subcutaneous QHS  . metolazone  5 mg Oral Daily  . rivaroxaban  15 mg Oral Q supper  . sodium chloride flush  3 mL Intravenous Q12H   Continuous Infusions: . sodium chloride    . doxycycline (VIBRAMYCIN) IV Stopped (12/09/16 1047)  . furosemide Stopped (12/09/16 1330)  . sodium chloride    . sodium chloride       LOS: 7 days   CHIU, Orpah Melter, MD [facility] Pager [phone]  If 7PM-7AM, please contact night-coverage [email or web address] Password [facility] 12/09/2016, 3:16 PM

## 2016-12-09 NOTE — Progress Notes (Signed)
Patient ID: John Bailey, male   DOB: March 24, 1970, 46 y.o.   MRN: [id number] S:No new complaints O:BP (!) 122/56 (BP Location: Left Arm)   Pulse 88   Temp 98.6 F (37 C) (Oral)   Resp 18   Ht 5\' 11"  (1.803 m)   Wt (!) 141.6 kg (312 lb 2.7 oz)   SpO2 98%   BMI 43.54 kg/m   Intake/Output Summary (Last 24 hours) at 12/09/16 1318 Last data filed at 12/09/16 0600  Gross per 24 hour  Intake              872 ml  Output             1775 ml  Net             -903 ml   Intake/Output: I/O last 3 completed shifts: In: 1991 [P.O.:1040; I.V.:3; IV Piggyback:948] Out: 3625 [Urine:3625]  Intake/Output this shift:  No intake/output data recorded. Weight change: -2.644 kg (-5 lb 13.3 oz) AYT:KZSWF AAM in NAD CVS:no rub Resp:cta  Abd: benign Ext:1+ pitting edema on right 2+ on left with bandage over left lower leg   Recent Labs Lab 12/02/16 1405 12/03/16 0359 12/04/16 0340 12/05/16 0321 12/06/16 0334 12/07/16 0749 12/08/16 0554  NA 131* 130* 129* 130* 127* 131* 130*  K 3.6 4.2 3.1* 3.8 3.2* 3.4* 3.5  CL 87* 85* 84* 82* 83* 85* 84*  CO2 29 29 29  32 31 32 32  GLUCOSE 233* 319* 297* 271* 347* 155* 209*  BUN 115* 128* 109* 119* 112* 101* 99*  CREATININE 3.12* 3.25* 3.34* 3.28* 3.32* 3.02* 2.79*  ALBUMIN 3.1* 2.6*  --   --   --   --   --   CALCIUM 8.8* 8.3* 8.2* 8.2* 7.7* 8.7* 8.5*  AST 23 19  --   --   --   --   --   ALT 33 28  --   --   --   --   --    Liver Function Tests:  Recent Labs Lab 12/02/16 1405 12/03/16 0359  AST 23 19  ALT 33 28  ALKPHOS 71 59  BILITOT 1.1 1.0  PROT 6.3* 5.7*  ALBUMIN 3.1* 2.6*   No results for input(s): LIPASE, AMYLASE in the last 168 hours. No results for input(s): AMMONIA in the last 168 hours. CBC:  Recent Labs Lab 12/02/16 1405 12/03/16 0359 12/04/16 0340 12/05/16 0321 12/06/16 0334 12/08/16 0554  WBC 18.0* 21.3* 15.2* 17.3* 14.1* 16.9*  NEUTROABS 14.7*  --   --   --   --   --   HGB 13.0 11.8* 11.8* 12.4* 11.3* 12.7*  HCT  39.3 36.8* 36.6* 38.8* 34.9* 39.7  MCV 80.2 79.8 79.0 80.7 80.0 80.7  PLT 374 315 333 382 332 380   Cardiac Enzymes: No results for input(s): CKTOTAL, CKMB, CKMBINDEX, TROPONINI in the last 168 hours. CBG:  Recent Labs Lab 12/08/16 1626 12/08/16 2103 12/09/16 0731 12/09/16 0821 12/09/16 1146  GLUCAP 187* 194* 52* 108* 119*    Iron Studies: No results for input(s): IRON, TIBC, TRANSFERRIN, FERRITIN in the last 72 hours. Studies/Results: No results found. . carvedilol  25 mg Oral BID WC  . colchicine  0.6 mg Oral BID  . diltiazem  300 mg Oral Daily  . gabapentin  300 mg Oral TID  . insulin aspart  0-9 Units Subcutaneous TID WC  . insulin aspart  6 Units Subcutaneous TID WC  . insulin glargine  30 Units  Subcutaneous QHS  . metolazone  5 mg Oral Daily  . rivaroxaban  15 mg Oral Q supper  . sodium chloride flush  3 mL Intravenous Q12H    BMET    Component Value Date/Time   NA 130 (L) 12/08/2016 0554   K 3.5 12/08/2016 0554   CL 84 (L) 12/08/2016 0554   CO2 32 12/08/2016 0554   GLUCOSE 209 (H) 12/08/2016 0554   BUN 99 (H) 12/08/2016 0554   CREATININE 2.79 (H) 12/08/2016 0554   CALCIUM 8.5 (L) 12/08/2016 0554   GFRNONAA 26 (L) 12/08/2016 0554   GFRAA 30 (L) 12/08/2016 0554   CBC    Component Value Date/Time   WBC 16.9 (H) 12/08/2016 0554   RBC 4.92 12/08/2016 0554   HGB 12.7 (L) 12/08/2016 0554   HCT 39.7 12/08/2016 0554   PLT 380 12/08/2016 0554   MCV 80.7 12/08/2016 0554   MCH 25.8 (L) 12/08/2016 0554   MCHC 32.0 12/08/2016 0554   RDW 16.4 (H) 12/08/2016 0554   LYMPHSABS 2.2 12/02/2016 1405   MONOABS 1.1 (H) 12/02/2016 1405   EOSABS 0.0 12/02/2016 1405   BASOSABS 0.0 12/02/2016 1405    Assessment/Plan:  1. AKI/CKD stage 4 in setting of decompensated CHF. Scr continues to slowly trend toward baseline of 2.5. Responding to IV lasxi and po zaroxolyn.  Stressed fluid and sodium restriction. 2. Volume overload/anasarca- as above 1. Increased lasix to tid  with marked improvement in diuresis. 2. Continue with tid lasix for now and follow renal function.   3. NICM- EF 35% 4. A fib with RVR- per cardiology 5. DM type 2 on insulin 6. Obesity 7. HTN- stable   Donetta Potts, MD [employer] [phone]

## 2016-12-10 LAB — CBC
HCT: 35.1 % — ABNORMAL LOW (ref 39.0–52.0)
Hemoglobin: 11.3 g/dL — ABNORMAL LOW (ref 13.0–17.0)
MCH: 25.9 pg — AB (ref 26.0–34.0)
MCHC: 32.2 g/dL (ref 30.0–36.0)
MCV: 80.5 fL (ref 78.0–100.0)
Platelets: 413 10*3/uL — ABNORMAL HIGH (ref 150–400)
RBC: 4.36 MIL/uL (ref 4.22–5.81)
RDW: 16.3 % — ABNORMAL HIGH (ref 11.5–15.5)
WBC: 12.5 10*3/uL — AB (ref 4.0–10.5)

## 2016-12-10 LAB — RENAL FUNCTION PANEL
ANION GAP: 15 (ref 5–15)
Albumin: 2.2 g/dL — ABNORMAL LOW (ref 3.5–5.0)
BUN: 92 mg/dL — ABNORMAL HIGH (ref 6–20)
CO2: 32 mmol/L (ref 22–32)
CREATININE: 2.84 mg/dL — AB (ref 0.61–1.24)
Calcium: 8.3 mg/dL — ABNORMAL LOW (ref 8.9–10.3)
Chloride: 83 mmol/L — ABNORMAL LOW (ref 101–111)
GFR, EST AFRICAN AMERICAN: 29 mL/min — AB (ref >60)
GFR, EST NON AFRICAN AMERICAN: 25 mL/min — AB (ref >60)
Glucose, Bld: 119 mg/dL — ABNORMAL HIGH (ref 65–99)
Phosphorus: 5.1 mg/dL — ABNORMAL HIGH (ref 2.5–4.6)
Potassium: 3.4 mmol/L — ABNORMAL LOW (ref 3.5–5.1)
Sodium: 130 mmol/L — ABNORMAL LOW (ref 135–145)

## 2016-12-10 LAB — GLUCOSE, CAPILLARY
GLUCOSE-CAPILLARY: 107 mg/dL — AB (ref 65–99)
GLUCOSE-CAPILLARY: 104 mg/dL — AB (ref 65–99)
GLUCOSE-CAPILLARY: 161 mg/dL — AB (ref 65–99)
Glucose-Capillary: 101 mg/dL — ABNORMAL HIGH (ref 65–99)

## 2016-12-10 MED ORDER — COLCHICINE 0.6 MG PO TABS
0.3000 mg | ORAL_TABLET | Freq: Every day | ORAL | Status: DC
  Administered 2016-12-11 – 2016-12-16 (×6): 0.3 mg via ORAL
  Filled 2016-12-10 (×6): qty 1

## 2016-12-10 MED ORDER — POTASSIUM CHLORIDE CRYS ER 20 MEQ PO TBCR
40.0000 meq | EXTENDED_RELEASE_TABLET | Freq: Once | ORAL | Status: AC
  Administered 2016-12-10: 40 meq via ORAL
  Filled 2016-12-10: qty 2

## 2016-12-10 MED ORDER — DILTIAZEM HCL 60 MG PO TABS
60.0000 mg | ORAL_TABLET | Freq: Four times a day (QID) | ORAL | Status: AC
  Administered 2016-12-10 (×3): 60 mg via ORAL
  Filled 2016-12-10 (×3): qty 1

## 2016-12-10 MED ORDER — DILTIAZEM HCL ER COATED BEADS 180 MG PO CP24
360.0000 mg | ORAL_CAPSULE | Freq: Every day | ORAL | Status: DC
  Administered 2016-12-11: 360 mg via ORAL
  Filled 2016-12-10: qty 2

## 2016-12-10 NOTE — Progress Notes (Signed)
PROGRESS NOTE    John Bailey  GEX:[id number] DOB: [birth year] DOA: 12/02/2016 PCP: [name], MD    Brief Narrative:  46 y.o.malewith a hx of diabetes, hypertension, atrial fibrillation on Xarelto, chronic systolic and diastolic CHF,  CKD4 admitted with severe vol overload, AKI and Afib RVR. 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 -Continue to encourage ambulation, OOB -Physical therapy was consulted    AKi on CKD4 -baseline creatinine around 2.5 -due to DM and hypertensive nephropathy and now with cardiorenal syndrome -Patient is continued on high dose IV lasix per Renal -Appreciate input by nephrology -Continue to monitor I's and O's and daily weights. -repeat bmet in AM  Secondary LLE cellulitis -improving, continue on doxycycline as tolerated -monitor -Remains afebrile    Paroxysmal atrial fibrillation (HCC) -with RVR on cardizem gtt -chadsvasc score is 5, on xarelto, may need to be switched to coumadin if creatinine worsens -rate improving, changed to PO cardizem  -Cardiology considering TEE with cardioversion when volume status improved   HTN -BP remains stable at this time    Insulin-dependent diabetes mellitus with  renal complications ([place]) -on lantus at home, now SSI  CBGS better now on increased lantus and meal coverage -Plan to continue to titrate insulin as needed with goal of euglycemia  DVT prophylaxis: Xarelto Code Status: Full Family Communication: Pt in room,family not at bedside Disposition Plan: Uncertain at this time  Consultants:   Cardiology  Nephrology  Procedures:     Antimicrobials: Anti-infectives    Start     Dose/Rate Route Frequency Ordered Stop   12/05/16 0800  doxycycline (VIBRAMYCIN) 100 mg in dextrose 5 % 250 mL IVPB     100 mg 125 mL/hr over 120 Minutes Intravenous Every 12 hours 12/05/16 0755     12/02/16 2000  piperacillin-tazobactam (ZOSYN) IVPB 3.375 g  Status:  Discontinued     3.375 g 12.5 mL/hr over 240 Minutes Intravenous Every 8 hours 12/02/16 1724 12/03/16 0756   12/02/16 1800  clindamycin (CLEOCIN) IVPB 900 mg  Status:  Discontinued     900 mg 100 mL/hr over 30 Minutes Intravenous Every 8 hours 12/02/16 1707 12/03/16 0756   12/02/16 1430  piperacillin-tazobactam (ZOSYN) IVPB 3.375 g     3.375 g 100 mL/hr over 30 Minutes Intravenous  Once 12/02/16 1424 12/02/16 1517   12/02/16 1430  vancomycin (VANCOCIN) IVPB 1000 mg/200 mL premix     1,000 mg 200 mL/hr over 60 Minutes Intravenous  Once 12/02/16 1424 12/02/16 1704      Subjective: Without complaints  Objective: Vitals:   12/09/16 0511 12/09/16 1457 12/09/16 2129 12/10/16 0506  BP: (!) 122/56 120/79 118/67 109/74  Pulse: 88 89 100 98  Resp: 18 18 16 18   Temp: 98.6 F (37 C) 98.3 F (  36.8 C) 100.1 F (37.8 C) 98.8 F (37.1 C)  TempSrc: Oral Oral Oral Oral  SpO2: 98% 92% 97% 98%  Weight:    (!) 139.4 kg (307 lb 5.1 oz)  Height:        Intake/Output Summary (Last 24 hours) at 12/10/16 1645 Last data filed at 12/10/16 1137  Gross per 24 hour  Intake              847 ml  Output             1900 ml  Net            -1053 ml   Filed Weights   12/08/16 0500 12/09/16 0500 12/10/16  0506  Weight: (!) 144.2 kg (318 lb) (!) 141.6 kg (312 lb 2.7 oz) (!) 139.4 kg (307 lb 5.1 oz)    Examination: General exam: Awake, laying in bed, in nad Respiratory system: Normal respiratory effort, no wheezing Cardiovascular system: regular rate, s1, s2 Gastrointestinal system: Soft, nondistended, positive BS Central nervous system: CN2-12 grossly intact, strength intact Extremities: Perfused, no clubbing Skin: Normal skin turgor, no notable skin lesions seen Psychiatry: Mood normal // no visual hallucinations    Data Reviewed: I have personally reviewed following labs and imaging studies  CBC:  Recent Labs Lab 12/04/16 0340 12/05/16 0321 12/06/16 0334 12/08/16 0554 12/10/16 0533  WBC 15.2* 17.3* 14.1* 16.9* 12.5*  HGB 11.8* 12.4* 11.3* 12.7* 11.3*  HCT 36.6* 38.8* 34.9* 39.7 35.1*  MCV 79.0 80.7 80.0 80.7 80.5  PLT 333 382 332 380 759*   Basic Metabolic Panel:  Recent Labs Lab 12/04/16 0340 12/05/16 0321 12/06/16 0334 12/07/16 0749 12/08/16 0554 12/10/16 0533  NA 129* 130* 127* 131* 130* 130*  K 3.1* 3.8 3.2* 3.4* 3.5 3.4*  CL 84* 82* 83* 85* 84* 83*  CO2 29 32 31 32 32 32  GLUCOSE 297* 271* 347* 155* 209* 119*  BUN 109* 119* 112* 101* 99* 92*  CREATININE 3.34* 3.28* 3.32* 3.02* 2.79* 2.84*  CALCIUM 8.2* 8.2* 7.7* 8.7* 8.5* 8.3*  MG 1.5*  --   --   --   --   --   PHOS  --   --   --   --   --  5.1*   GFR: Estimated Creatinine Clearance: 47.4 mL/min (A) (by C-G formula based on SCr of 2.84 mg/dL (H)). Liver Function Tests:  Recent Labs Lab 12/10/16 0533  ALBUMIN 2.2*   No results for input(s): LIPASE, AMYLASE in the last 168 hours. No results for input(s): AMMONIA in the last 168 hours. Coagulation Profile: No results for input(s): INR, PROTIME in the last 168 hours. Cardiac Enzymes: No results for input(s): CKTOTAL, CKMB, CKMBINDEX, TROPONINI in the last 168 hours. BNP (last 3 results) No results for input(s): PROBNP in the last 8760  hours. HbA1C: No results for input(s): HGBA1C in the last 72 hours. CBG:  Recent Labs Lab 12/09/16 1146 12/09/16 1722 12/09/16 2127 12/10/16 0742 12/10/16 1238  GLUCAP 119* 84 147* 101* 107*   Lipid Profile: No results for input(s): CHOL, HDL, LDLCALC, TRIG, CHOLHDL, LDLDIRECT in the last 72 hours. Thyroid Function Tests: No results for input(s): TSH, T4TOTAL, FREET4, T3FREE, THYROIDAB in the last 72 hours. Anemia Panel: No results for input(s): VITAMINB12, FOLATE, FERRITIN, TIBC, IRON, RETICCTPCT in the last 72 hours. Sepsis Labs: No results for input(s): PROCALCITON, LATICACIDVEN in the last 168 hours.  Recent Results (from the past 240 hour(s))  Culture, blood (Routine X 2) w  Reflex to ID Panel     Status: None   Collection Time: 12/02/16  2:05 PM  Result Value Ref Range Status   Specimen Description BLOOD LEFT ANTECUBITAL  Final   Special Requests   Final    BOTTLES DRAWN AEROBIC AND ANAEROBIC Blood Culture results may not be optimal due to an inadequate volume of blood received in culture bottles   Culture   Final    NO GROWTH 5 DAYS Performed at [facility], [place] [address] [place], [place] [address]    Report Status 12/07/2016 FINAL  Final  Culture, blood (Routine X 2) w Reflex to ID Panel     Status: None   Collection Time: 12/02/16  2:07 PM  Result Value Ref Range Status   Specimen Description BLOOD RIGHT ANTECUBITAL  Final   Special Requests   Final    BOTTLES DRAWN AEROBIC AND ANAEROBIC Blood Culture results may not be optimal due to an inadequate volume of blood received in culture bottles   Culture   Final    NO GROWTH 5 DAYS Performed at [facility], [place] [address], [place], [place] [address]    Report Status 12/07/2016 FINAL  Final  MRSA PCR Screening     Status: None   Collection Time: 12/02/16 10:01 PM  Result Value Ref Range Status   MRSA by PCR NEGATIVE NEGATIVE Final    Comment:        The GeneXpert MRSA Assay (FDA approved for  NASAL specimens only), is one component of a comprehensive MRSA colonization surveillance program. It is not intended to diagnose MRSA infection nor to guide or monitor treatment for MRSA infections.      Radiology Studies: No results found.  Scheduled Meds: . carvedilol  25 mg Oral BID WC  . [START ON 12/11/2016] colchicine  0.3 mg Oral Daily  . [START ON 12/11/2016] diltiazem  360 mg Oral Daily  . diltiazem  60 mg Oral Q6H  . gabapentin  300 mg Oral TID  . insulin aspart  0-9 Units Subcutaneous TID WC  . insulin aspart  6 Units Subcutaneous TID WC  . insulin glargine  30 Units Subcutaneous QHS  . metolazone  5 mg Oral Daily  . rivaroxaban  15 mg Oral Q supper  . sodium chloride flush  3 mL Intravenous Q12H   Continuous Infusions: . sodium chloride    . doxycycline (VIBRAMYCIN) IV Stopped (12/10/16 1127)  . furosemide Stopped (12/10/16 1300)  . sodium chloride    . sodium chloride       LOS: 8 days   Cassian Torelli, Orpah Melter, MD [facility] Pager [phone]  If 7PM-7AM, please contact night-coverage [email or web address] Password [facility] 12/10/2016, 4:45 PM

## 2016-12-10 NOTE — Progress Notes (Addendum)
Progress Note  Patient Name: John Bailey Date of Encounter: 12/10/2016  Primary Cardiologist: Dr. Debara Pickett  Subjective   Pt denies chest pain, palpitations, dizziness. States the leg swelling is improving  Inpatient Medications    Scheduled Meds: . carvedilol  25 mg Oral BID WC  . colchicine  0.6 mg Oral BID  . diltiazem  300 mg Oral Daily  . gabapentin  300 mg Oral TID  . insulin aspart  0-9 Units Subcutaneous TID WC  . insulin aspart  6 Units Subcutaneous TID WC  . insulin glargine  30 Units Subcutaneous QHS  . metolazone  5 mg Oral Daily  . rivaroxaban  15 mg Oral Q supper  . sodium chloride flush  3 mL Intravenous Q12H   Continuous Infusions: . sodium chloride    . doxycycline (VIBRAMYCIN) IV 100 mg (12/10/16 0927)  . furosemide Stopped (12/10/16 0604)  . sodium chloride    . sodium chloride     PRN Meds: sodium chloride, acetaminophen **OR** acetaminophen, albuterol, levalbuterol, lip balm, menthol-cetylpyridinium, ondansetron **OR** ondansetron (ZOFRAN) IV, oxyCODONE, sodium chloride flush   Vital Signs    Vitals:   12/09/16 0511 12/09/16 1457 12/09/16 2129 12/10/16 0506  BP: (!) 122/56 120/79 118/67 109/74  Pulse: 88 89 100 98  Resp: 18 18 16 18   Temp: 98.6 F (37 C) 98.3 F (36.8 C) 100.1 F (37.8 C) 98.8 F (37.1 C)  TempSrc: Oral Oral Oral Oral  SpO2: 98% 92% 97% 98%  Weight:    (!) 307 lb 5.1 oz (139.4 kg)  Height:        Intake/Output Summary (Last 24 hours) at 12/10/16 1026 Last data filed at 12/10/16 0744  Gross per 24 hour  Intake             1167 ml  Output             2400 ml  Net            -1233 ml   Filed Weights   12/08/16 0500 12/09/16 0500 12/10/16 0506  Weight: (!) 318 lb (144.2 kg) (!) 312 lb 2.7 oz (141.6 kg) (!) 307 lb 5.1 oz (139.4 kg)     Physical Exam   General: Obese male appearing in no acute distress. Head: Normocephalic, atraumatic.  Neck: Supple without bruits, no JVD Lungs:  Resp regular and unlabored,  CTA. Heart: Irregular rhythm, tachycardic rate, no murmur. Abdomen: Soft, non-tender, non-distended with normoactive bowel sounds. No hepatomegaly. No rebound/guarding. No obvious abdominal masses. Extremities: No clubbing, cyanosis, excessive edema with weeping, exam difficult 2/ leg pain. Neuro: Alert and oriented X 3. Moves all extremities spontaneously. Psych: Normal affect.  Labs    Chemistry Recent Labs Lab 12/07/16 0749 12/08/16 0554 12/10/16 0533  NA 131* 130* 130*  K 3.4* 3.5 3.4*  CL 85* 84* 83*  CO2 32 32 32  GLUCOSE 155* 209* 119*  BUN 101* 99* 92*  CREATININE 3.02* 2.79* 2.84*  CALCIUM 8.7* 8.5* 8.3*  ALBUMIN  --   --  2.2*  GFRNONAA 24* 26* 25*  GFRAA 27* 30* 29*  ANIONGAP 14 14 15      Hematology Recent Labs Lab 12/06/16 0334 12/08/16 0554 12/10/16 0533  WBC 14.1* 16.9* 12.5*  RBC 4.36 4.92 4.36  HGB 11.3* 12.7* 11.3*  HCT 34.9* 39.7 35.1*  MCV 80.0 80.7 80.5  MCH 25.9* 25.8* 25.9*  MCHC 32.4 32.0 32.2  RDW 16.2* 16.4* 16.3*  PLT 332 380 413*  Cardiac EnzymesNo results for input(s): TROPONINI in the last 168 hours. No results for input(s): TROPIPOC in the last 168 hours.   BNPNo results for input(s): BNP, PROBNP in the last 168 hours.   DDimer No results for input(s): DDIMER in the last 168 hours.   Radiology    No results found.   Telemetry    Afib in the 100s, but bouts of RVR overnight in the 140s - Personally Reviewed  ECG    No new tracings - Personally Reviewed   Cardiac Studies   Echo 06/2016 Study Conclusions - Left ventricle: The cavity size is small. 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 Systolic function was normal. The estimated ejection fraction was in the range of 50% to 55%. Distal anterior/apical hypokinesis seen on Definity contrast images. Doppler parameters are consistent with restrictive physiology (grade 3 diastolic  dysfunction). The E/A ratio is >2.5. The E/e&' ratio is >20, suggesting markedly elevated LV filling pressure. - Mitral valve: Mitral B-notch noted, suggesting elevated LA pressure. - Left atrium: Moderately dilated. - Right ventricle: The cavity size was mildly dilated. Systolic function was normal. - Right atrium: Severely dilated. - Tricuspid valve: There was trivial regurgitation. - Pulmonary arteries: PA peak pressure: 19 mm Hg (S). - Inferior vena cava: The vessel was normal in size. The respirophasic diameter changes were in the normal range (>= 50%), consistent with normal central venous pressure. - Pericardium, extracardiac: Small pericardial effusion. Features were not consistent with tamponade physiology.  Impressions: - Compared to a prior study in 2017, the LVEF has increased to 50-55% with distal anterior/apical hypokinesis as seen on Definity contrast images.   Echocardiogram 07/31/15: Study Conclusions - Left ventricle: Poorly visualized. The cavity size was normal.   There was severe concentric hypertrophy. Systolic function was   moderately reduced. The estimated ejection fraction was in the   range of 35% to 40%. Wall motion was normal; there were no   regional wall motion abnormalities. Doppler parameters are   consistent with a reversible restrictive pattern, indicative of   decreased left ventricular diastolic compliance and/or increased   left atrial pressure (grade 3 diastolic dysfunction). - Mitral valve: There was mild regurgitation. - Left atrium: The atrium was severely dilated. - Right ventricle: Systolic function was moderately reduced. - Right atrium: The atrium was moderately dilated. - Tricuspid valve: There was moderate regurgitation. - Pulmonary arteries: Systolic pressure was mildly increased. PA   peak pressure: 34 mm Hg (S). - Pericardium, extracardiac: A small pericardial effusion was   identified circumferential to the  heart. There was no evidence of   hemodynamic compromise.  Impressions: - EF reduced when compared to prior (50%).   Patient Profile     46 y.o. male with chronic combined systolic and diastolic congestive heart failure. He also has chronic kidney disease admitted with decompensated CHF and back in a fib.  Assessment & Plan    1. Acute on chronic systolic and diastolic heart failure - BNP on admission 554 with CKD 4 - diuresing on 160 mg IV lasix TID - overall net negative 7.5L with 2.1 L urine output yesterday - weight 307 lbs from 312 yesterday, peaked at 326 lbs   2. Persistent Afib - previously sinus rhythm in Jan 2018 (EKG) and May 2018 (exam) - presented with Afib RVR with RBBB - telemetry with Afib in the 100s with bouts of RVR overnight in the 140s  - on coreg  25 mg BID, diltiazem 300 mg daily - will increase diltiazem today, may need to switch coreg for toprol if not better rate controlled tomorrow - ordered 60 mg cardizem q 6 hr for three doses to bridge higher dose ordered for tomorrow - consider TEE/DCCV when euvolemic - This patients CHA2DS2-VASc Score and unadjusted Ischemic Stroke Rate (% per year) is equal to 3.2 % stroke rate/year from a score of 3 (HTN, CHF, DM) - xarelto dose decreased to 15 mg daily for kidney function   3. CKD stage 4 - sCr 2.84 (2.79), baseline 2.26 (2017) - continue diuresing   4. Hyponatremia -  Stable at 130, asymptomatic   5. IDDM - per primary team   6. Hypokalemia - K 3.4, replace per primary team - received 28 m Eq today   Signed, Ledora Bottcher , PA-C 10:26 AM 12/10/2016 Pager: [phone]  History and all data above reviewed.  Patient examined.  I agree with the findings as above.   He feels better but still with some SOB and cough.  The patient exam reveals COR:RRR  ,  Lungs: Crackles at the right base  ,  Abd: Positive bowel sounds, no rebound no guarding , Ext Anasarca slightly improved.   .  All available  labs, radiology testing, previous records reviewed. Agree with documented assessment and plan. Anasarca:  Seems to be turning the corner with urine out put.  Continue current level of diuresis.  He has many pounds of fluid to lose before he would be a good candidate for discharge.   Jeneen Rinks Timberlee Roblero  2:06 PM  12/10/2016

## 2016-12-10 NOTE — Progress Notes (Signed)
PT Cancellation Note  Patient Details Name: John Bailey MRN: [id number] DOB: January 03, 1971   Cancelled Treatment:    Reason Eval/Treat Not Completed: Medical issues which prohibited therapy (RN reports afib with rate 130-150s.  Will check back as schedule permits.)   Raygen Linquist,KATHrine E 12/10/2016, 9:40 AM Carmelia Bake, PT, DPT 12/10/2016 Pager: [phone]

## 2016-12-10 NOTE — Progress Notes (Signed)
Patient ID: John Bailey, male   DOB: 1971-02-18, 46 y.o.   MRN: [id number] S: feeling better and thinks swelling has improved O:BP 109/74 (BP Location: Right Arm)   Pulse 98   Temp 98.8 F (37.1 C) (Oral)   Resp 18   Ht 5\' 11"  (1.803 m)   Wt (!) 139.4 kg (307 lb 5.1 oz)   SpO2 98%   BMI 42.86 kg/m   Intake/Output Summary (Last 24 hours) at 12/10/16 0843 Last data filed at 12/10/16 0744  Gross per 24 hour  Intake             1367 ml  Output             2400 ml  Net            -1033 ml   Intake/Output: I/O last 3 completed shifts: In: 1803 [P.O.:1130; IV Piggyback:673] Out: 3275 [Urine:3275]  Intake/Output this shift:  Total I/O In: -  Out: 300 [Urine:300] Weight change: -2.2 kg (-4 lb 13.6 oz) Gen: NAD CVS:no rub Resp:cta YTK:PTWSFK Ext:2+ edema on left with bandage over lower leg, 1+ on right   Recent Labs Lab 12/04/16 0340 12/05/16 0321 12/06/16 0334 12/07/16 0749 12/08/16 0554 12/10/16 0533  NA 129* 130* 127* 131* 130* 130*  K 3.1* 3.8 3.2* 3.4* 3.5 3.4*  CL 84* 82* 83* 85* 84* 83*  CO2 29 32 31 32 32 32  GLUCOSE 297* 271* 347* 155* 209* 119*  BUN 109* 119* 112* 101* 99* 92*  CREATININE 3.34* 3.28* 3.32* 3.02* 2.79* 2.84*  ALBUMIN  --   --   --   --   --  2.2*  CALCIUM 8.2* 8.2* 7.7* 8.7* 8.5* 8.3*  PHOS  --   --   --   --   --  5.1*   Liver Function Tests:  Recent Labs Lab 12/10/16 0533  ALBUMIN 2.2*   No results for input(s): LIPASE, AMYLASE in the last 168 hours. No results for input(s): AMMONIA in the last 168 hours. CBC:  Recent Labs Lab 12/04/16 0340 12/05/16 0321 12/06/16 0334 12/08/16 0554 12/10/16 0533  WBC 15.2* 17.3* 14.1* 16.9* 12.5*  HGB 11.8* 12.4* 11.3* 12.7* 11.3*  HCT 36.6* 38.8* 34.9* 39.7 35.1*  MCV 79.0 80.7 80.0 80.7 80.5  PLT 333 382 332 380 413*   Cardiac Enzymes: No results for input(s): CKTOTAL, CKMB, CKMBINDEX, TROPONINI in the last 168 hours. CBG:  Recent Labs Lab 12/09/16 0821 12/09/16 1146  12/09/16 1722 12/09/16 2127 12/10/16 0742  GLUCAP 108* 119* 84 147* 101*    Iron Studies: No results for input(s): IRON, TIBC, TRANSFERRIN, FERRITIN in the last 72 hours. Studies/Results: No results found. . carvedilol  25 mg Oral BID WC  . colchicine  0.6 mg Oral BID  . diltiazem  300 mg Oral Daily  . gabapentin  300 mg Oral TID  . insulin aspart  0-9 Units Subcutaneous TID WC  . insulin aspart  6 Units Subcutaneous TID WC  . insulin glargine  30 Units Subcutaneous QHS  . metolazone  5 mg Oral Daily  . potassium chloride  40 mEq Oral Once  . rivaroxaban  15 mg Oral Q supper  . sodium chloride flush  3 mL Intravenous Q12H    BMET    Component Value Date/Time   NA 130 (L) 12/10/2016 0533   K 3.4 (L) 12/10/2016 0533   CL 83 (L) 12/10/2016 0533   CO2 32 12/10/2016 0533   GLUCOSE 119 (H)  12/10/2016 0533   BUN 92 (H) 12/10/2016 0533   CREATININE 2.84 (H) 12/10/2016 0533   CALCIUM 8.3 (L) 12/10/2016 0533   GFRNONAA 25 (L) 12/10/2016 0533   GFRAA 29 (L) 12/10/2016 0533   CBC    Component Value Date/Time   WBC 12.5 (H) 12/10/2016 0533   RBC 4.36 12/10/2016 0533   HGB 11.3 (L) 12/10/2016 0533   HCT 35.1 (L) 12/10/2016 0533   PLT 413 (H) 12/10/2016 0533   MCV 80.5 12/10/2016 0533   MCH 25.9 (L) 12/10/2016 0533   MCHC 32.2 12/10/2016 0533   RDW 16.3 (H) 12/10/2016 0533   LYMPHSABS 2.2 12/02/2016 1405   MONOABS 1.1 (H) 12/02/2016 1405   EOSABS 0.0 12/02/2016 1405   BASOSABS 0.0 12/02/2016 1405     Assessment/Plan:  1. AKI/CKD stage 4 in setting of decompensated CHF. Scr continues to slowly trend toward baseline of 2.5. Responding to IV lasxi and po zaroxolyn. Stressed fluid and sodium restriction. 2. Volume overload/anasarca- as above 1. Increased lasix to tid with marked improvement in diuresis. 2. Continue with tid lasix for now and follow renal function.   3. NICM- EF 35% 4. Hypokalemia- will replete and check Mg level 5. A fib with RVR- per  cardiology 6. DM type 2 on insulin 7. Obesity 8. HTN- stable 9. Anemia of CKD- will check iron stores  Donetta Potts, MD [employer] [phone]

## 2016-12-10 NOTE — Consult Note (Signed)
[facility] Nurse wound consult note Reason for Consult:RLE ulcers Wound type:venous insufficiency Pressure Injury POA: NA Measurement:largest is 2cm x 2.2cm x 0.1cm red wound bed, small amt yellow drainage, no odor. There are three 0.6cm round x 0.1 partial thickness wounds two with red wound beds, one with yellow slough, scant yellow drainage, no odor. Wound bed:see above Drainage (amount, consistency, odor) see above Periwound:see above Dressing procedure/placement/frequency: Visit made, when I peeled back dressing it removed tops of 3 of the wounds, one still has small amt of slough. I placed dressing today. Pt has a lot of edema in both legs, has known cardiac problems.  Do not want to wrap legs and have fluid over load pt. His ejection fraction is decreased. Encouraged him to keep legs elevated with a bend in knee. I have provided nurses with orders for Cleanse wounds on left lower leg with NS, pat dry, apply Xeroform, cover with dry gauze, wrap with kerlix, adhere kerlix to kerlix, no tape on skin, perform daily. We will not follow, but will remain available to this patient, to nursing, and the medical and/or surgical teams.  Please re-consult if we need to assist further.   Fara Olden, RN-C, WTA-C Wound Treatment Associate

## 2016-12-10 NOTE — Evaluation (Signed)
Occupational Therapy Evaluation Patient Details Name: John Bailey MRN: [id number] DOB: 19-Nov-1970 Today's Date: 12/10/2016    History of Present Illness 46 y.o. male with a hx of diabetes, hypertension, atrial fibrillation on Xarelto, chronic systolic and diastolic CHF,  CKD4  admitted with severe vol overload, AKI and Afib RVR.   Clinical Impression   This 46 y/o M presents with the above. At baseline Pt was independent with ADLs and independent-mod independent with functional mobility, intermittently using SPC. Pt completed room level functional mobility with close minguard throughout, appears slightly unsteady due to pain in LLE limiting movement. Pt requires ModA for LB ADLs secondary to LLE limitations. Pt will benefit from continued acute OT services to progress Pt's safety and independence with ADLs and functional mobility prior to return home.     Follow Up Recommendations  No OT follow up;Supervision/Assistance - 24 hour    Equipment Recommendations  3 in 1 bedside commode (bariatric BSC )           Precautions / Restrictions Precautions Precautions: None Restrictions Weight Bearing Restrictions: No      Mobility Bed Mobility Overal bed mobility: Needs Assistance Bed Mobility: Supine to Sit     Supine to sit: HOB elevated;Min guard     General bed mobility comments: close guard for safety, increased time, HOB elevated   Transfers Overall transfer level: Needs assistance Equipment used: None Transfers: Sit to/from Stand Sit to Stand: Min guard         General transfer comment: close guard for safety, increased time and effort to rise, pushing IV pole during mobility within room     Balance Overall balance assessment: Needs assistance Sitting-balance support: Feet supported Sitting balance-Leahy Scale: Good     Standing balance support: Single extremity supported Standing balance-Leahy Scale: Fair Standing balance comment: Pt slightly unsteady  during mobility within room due to LLE pain, requires close minguard for safety                            ADL either performed or assessed with clinical judgement   ADL Overall ADL's : Needs assistance/impaired Eating/Feeding: Set up;Sitting   Grooming: Set up;Sitting   Upper Body Bathing: Min guard;Sitting   Lower Body Bathing: Minimal assistance;Sit to/from stand Lower Body Bathing Details (indicate cue type and reason): educated on completing task from sit<>stand level (vs standing only)  Upper Body Dressing : Min guard;Sitting   Lower Body Dressing: Moderate assistance;Sit to/from stand   Toilet Transfer: Min guard;Comfort height toilet;Grab bars Armed forces technical officer Details (indicate cue type and reason): close min guard, increased effort to rise from toilet Toileting- Water quality scientist and Hygiene: Min guard;Sit to/from stand       Functional mobility during ADLs: Min guard (Pt pushing IV pole ) General ADL Comments: Pt completed room level functional mobility, pushing IV pole, with close minguard throughout. Pt appears unsteady during mobility due to increased pain in LLE, may benefit from trial of RW pending Pt's progress/pain levels                         Pertinent Vitals/Pain Pain Assessment: Faces Faces Pain Scale: Hurts whole lot Pain Location: LLE  Pain Descriptors / Indicators: Aching;Grimacing;Discomfort Pain Intervention(s): Limited activity within patient's tolerance;Monitored during session;Repositioned          Extremity/Trunk Assessment Upper Extremity Assessment Upper Extremity Assessment: Overall WFL for tasks assessed   Lower Extremity  Assessment Lower Extremity Assessment: Defer to PT evaluation       Communication Communication Communication: No difficulties   Cognition Arousal/Alertness: Awake/alert Behavior During Therapy: WFL for tasks assessed/performed Overall Cognitive Status: Within Functional Limits for tasks  assessed                                                      Home Living Family/patient expects to be discharged to:: Private residence Living Arrangements: Spouse/significant other;Children (wife and two children ) Available Help at Discharge: Family Type of Home: House Home Access: Level entry     Home Layout: Multi-level Alternate Level Stairs-Number of Steps: 2 flights (8-10 each) Alternate Level Stairs-Rails: Left Bathroom Shower/Tub: Occupational psychologist: Standard     Home Equipment: Cane - single point          Prior Functioning/Environment Level of Independence: Independent with assistive device(s);Independent                 OT Problem List: Decreased strength;Decreased activity tolerance;Decreased knowledge of use of DME or AE;Pain;Increased edema;Impaired balance (sitting and/or standing)      OT Treatment/Interventions: Self-care/ADL training;DME and/or AE instruction;Therapeutic activities;Balance training;Therapeutic exercise;Energy conservation    OT Goals(Current goals can be found in the care plan section) Acute Rehab OT Goals Patient Stated Goal: regain independence  OT Goal Formulation: With patient Time For Goal Achievement: 12/24/16 Potential to Achieve Goals: Good  OT Frequency: Min 2X/week                             AM-PAC PT "6 Clicks" Daily Activity     Outcome Measure Help from another person eating meals?: None Help from another person taking care of personal grooming?: A Little Help from another person toileting, which includes using toliet, bedpan, or urinal?: A Little Help from another person bathing (including washing, rinsing, drying)?: A Little Help from another person to put on and taking off regular upper body clothing?: None Help from another person to put on and taking off regular lower body clothing?: A Lot 6 Click Score: 19   End of Session Equipment Utilized During  Treatment: Gait belt Nurse Communication: Mobility status  Activity Tolerance: Patient tolerated treatment well Patient left: in chair;with call bell/phone within reach;with family/visitor present  OT Visit Diagnosis: Unsteadiness on feet (R26.81);Other abnormalities of gait and mobility (R26.89);Pain Pain - Right/Left: Left Pain - part of body: Ankle and joints of foot                Time: 1412-1435 OT Time Calculation (min): 23 min Charges:  OT General Charges $OT Visit: 1 Visit OT Evaluation $OT Eval Low Complexity: 1 Low G-Codes:     Lou Cal, OT Pager [phone] 12/10/2016   [name] 12/10/2016, 3:55 PM

## 2016-12-11 LAB — RENAL FUNCTION PANEL
ANION GAP: 14 (ref 5–15)
Albumin: 2.2 g/dL — ABNORMAL LOW (ref 3.5–5.0)
BUN: 96 mg/dL — ABNORMAL HIGH (ref 6–20)
CHLORIDE: 82 mmol/L — AB (ref 101–111)
CO2: 33 mmol/L — ABNORMAL HIGH (ref 22–32)
Calcium: 8.4 mg/dL — ABNORMAL LOW (ref 8.9–10.3)
Creatinine, Ser: 3.14 mg/dL — ABNORMAL HIGH (ref 0.61–1.24)
GFR, EST AFRICAN AMERICAN: 26 mL/min — AB (ref >60)
GFR, EST NON AFRICAN AMERICAN: 23 mL/min — AB (ref >60)
Glucose, Bld: 142 mg/dL — ABNORMAL HIGH (ref 65–99)
POTASSIUM: 3.4 mmol/L — AB (ref 3.5–5.1)
Phosphorus: 5.5 mg/dL — ABNORMAL HIGH (ref 2.5–4.6)
Sodium: 129 mmol/L — ABNORMAL LOW (ref 135–145)

## 2016-12-11 LAB — IRON AND TIBC
IRON: 21 ug/dL — AB (ref 45–182)
Saturation Ratios: 8 % — ABNORMAL LOW (ref 17.9–39.5)
TIBC: 248 ug/dL — AB (ref 250–450)
UIBC: 227 ug/dL

## 2016-12-11 LAB — GLUCOSE, CAPILLARY
GLUCOSE-CAPILLARY: 117 mg/dL — AB (ref 65–99)
GLUCOSE-CAPILLARY: 94 mg/dL (ref 65–99)
GLUCOSE-CAPILLARY: 83 mg/dL (ref 65–99)
GLUCOSE-CAPILLARY: 117 mg/dL — AB (ref 65–99)

## 2016-12-11 LAB — MAGNESIUM: MAGNESIUM: 1.4 mg/dL — AB (ref 1.7–2.4)

## 2016-12-11 LAB — FERRITIN: Ferritin: 266 ng/mL (ref 24–336)

## 2016-12-11 MED ORDER — DOXYCYCLINE HYCLATE 100 MG PO TABS
100.0000 mg | ORAL_TABLET | Freq: Two times a day (BID) | ORAL | Status: DC
  Administered 2016-12-11 – 2016-12-14 (×7): 100 mg via ORAL
  Filled 2016-12-11 (×7): qty 1

## 2016-12-11 MED ORDER — POTASSIUM CHLORIDE CRYS ER 20 MEQ PO TBCR
20.0000 meq | EXTENDED_RELEASE_TABLET | Freq: Two times a day (BID) | ORAL | Status: DC
  Administered 2016-12-11 – 2016-12-16 (×11): 20 meq via ORAL
  Filled 2016-12-11 (×11): qty 1

## 2016-12-11 MED ORDER — POTASSIUM CHLORIDE CRYS ER 20 MEQ PO TBCR
60.0000 meq | EXTENDED_RELEASE_TABLET | Freq: Once | ORAL | Status: AC
  Administered 2016-12-11: 60 meq via ORAL
  Filled 2016-12-11: qty 3

## 2016-12-11 MED ORDER — MAGNESIUM SULFATE 2 GM/50ML IV SOLN
2.0000 g | Freq: Once | INTRAVENOUS | Status: AC
  Administered 2016-12-11: 2 g via INTRAVENOUS
  Filled 2016-12-11: qty 50

## 2016-12-11 NOTE — Progress Notes (Signed)
Patient ID: John Bailey, male   DOB: 1971/02/21, 46 y.o.   MRN: [id number] S: No new complaints O:BP 117/78 (BP Location: Right Arm)   Pulse 87   Temp 100 F (37.8 C) (Oral)   Resp 16   Ht 5\' 11"  (1.803 m)   Wt (!) 139.4 kg (307 lb 5.1 oz)   SpO2 97%   BMI 42.86 kg/m   Intake/Output Summary (Last 24 hours) at 12/11/16 1146 Last data filed at 12/11/16 0748  Gross per 24 hour  Intake              502 ml  Output             1700 ml  Net            -1198 ml   Intake/Output: I/O last 3 completed shifts: In: 1099 [P.O.:360; IV Piggyback:739] Out: 3100 [Urine:3100]  Intake/Output this shift:  Total I/O In: -  Out: 500 [Urine:500] Weight change: 0 kg (0 lb) Gen:NAD CVS: no rub Resp:cta  MEQ:ASTMHD Ext: 2+ edema    Recent Labs Lab 12/05/16 0321 12/06/16 0334 12/07/16 0749 12/08/16 0554 12/10/16 0533 12/11/16 0441  NA 130* 127* 131* 130* 130* 129*  K 3.8 3.2* 3.4* 3.5 3.4* 3.4*  CL 82* 83* 85* 84* 83* 82*  CO2 32 31 32 32 32 33*  GLUCOSE 271* 347* 155* 209* 119* 142*  BUN 119* 112* 101* 99* 92* 96*  CREATININE 3.28* 3.32* 3.02* 2.79* 2.84* 3.14*  ALBUMIN  --   --   --   --  2.2* 2.2*  CALCIUM 8.2* 7.7* 8.7* 8.5* 8.3* 8.4*  PHOS  --   --   --   --  5.1* 5.5*   Liver Function Tests:  Recent Labs Lab 12/10/16 0533 12/11/16 0441  ALBUMIN 2.2* 2.2*   No results for input(s): LIPASE, AMYLASE in the last 168 hours. No results for input(s): AMMONIA in the last 168 hours. CBC:  Recent Labs Lab 12/05/16 0321 12/06/16 0334 12/08/16 0554 12/10/16 0533  WBC 17.3* 14.1* 16.9* 12.5*  HGB 12.4* 11.3* 12.7* 11.3*  HCT 38.8* 34.9* 39.7 35.1*  MCV 80.7 80.0 80.7 80.5  PLT 382 332 380 413*   Cardiac Enzymes: No results for input(s): CKTOTAL, CKMB, CKMBINDEX, TROPONINI in the last 168 hours. CBG:  Recent Labs Lab 12/10/16 0742 12/10/16 1238 12/10/16 1854 12/10/16 2200 12/11/16 0746  GLUCAP 101* 107* 104* 161* 117*    Iron Studies:  Recent Labs   12/11/16 0441  IRON 21*  TIBC 248*  FERRITIN 266   Studies/Results: No results found. . carvedilol  25 mg Oral BID WC  . colchicine  0.3 mg Oral Daily  . diltiazem  360 mg Oral Daily  . doxycycline  100 mg Oral Q12H  . gabapentin  300 mg Oral TID  . insulin aspart  0-9 Units Subcutaneous TID WC  . insulin aspart  6 Units Subcutaneous TID WC  . insulin glargine  30 Units Subcutaneous QHS  . metolazone  5 mg Oral Daily  . rivaroxaban  15 mg Oral Q supper  . sodium chloride flush  3 mL Intravenous Q12H    BMET    Component Value Date/Time   NA 129 (L) 12/11/2016 0441   K 3.4 (L) 12/11/2016 0441   CL 82 (L) 12/11/2016 0441   CO2 33 (H) 12/11/2016 0441   GLUCOSE 142 (H) 12/11/2016 0441   BUN 96 (H) 12/11/2016 0441   CREATININE 3.14 (H) 12/11/2016 0441  CALCIUM 8.4 (L) 12/11/2016 0441   GFRNONAA 23 (L) 12/11/2016 0441   GFRAA 26 (L) 12/11/2016 0441   CBC    Component Value Date/Time   WBC 12.5 (H) 12/10/2016 0533   RBC 4.36 12/10/2016 0533   HGB 11.3 (L) 12/10/2016 0533   HCT 35.1 (L) 12/10/2016 0533   PLT 413 (H) 12/10/2016 0533   MCV 80.5 12/10/2016 0533   MCH 25.9 (L) 12/10/2016 0533   MCHC 32.2 12/10/2016 0533   RDW 16.3 (H) 12/10/2016 0533   LYMPHSABS 2.2 12/02/2016 1405   MONOABS 1.1 (H) 12/02/2016 1405   EOSABS 0.0 12/02/2016 1405   BASOSABS 0.0 12/02/2016 1405     Assessment/Plan:  1. AKI/CKD stage 4 in setting of decompensated CHF. Scr continues to slowly trend toward baseline of 2.5. Responding to IV lasxi and po zaroxolyn. Stressed fluid and sodium restriction. 2. Volume overload/anasarca- as above but no change in weight overnight.  I/O negative 1.2l and 8.7 since admission 1. Continue with tid lasix for now and follow renal function.   3. NICM- EF 35% 4. A fib with RVR- per cardiology on dilt and possible TEE/DCCV when euvolemic, on xarelto 15mg  daily 5. Hypokalemia- replete and follow  6. DM type 2 on insulin 7. Obesity 8. HTN-  stable  Donetta Potts, MD [employer] [phone]

## 2016-12-11 NOTE — Progress Notes (Signed)
PROGRESS NOTE    John Bailey  JEH:[id number] DOB: 1970-09-20 DOA: 12/02/2016 PCP: [name], MD    Brief Narrative:  46 y.o.malewith a hx of diabetes, hypertension, atrial fibrillation on Xarelto, chronic systolic and diastolic CHF,  CKD4 admitted with severe vol overload, AKI and Afib RVR. 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 -Continue to encourage ambulation, OOB -cardiology following, patient to continue Lasix    AKi on CKD4 -baseline creatinine around 2.5 -due to DM and hypertensive nephropathy and now with cardiorenal syndrome -Patient is continued on high dose IV lasix per Renal -Appreciate input by nephrology -Continue to monitor I's and O's and daily weights. -repeat basic metabolic panel the morning  Secondary LLE cellulitis -improving, continue on doxycycline as tolerated -monitor -Remains afebrile -we'll transition IV doxycycline to by mouth doxycycline or to minimize amount of fluid intake.    Paroxysmal atrial fibrillation (HCC) -with RVR on cardizem gtt -chadsvasc score is 5, on xarelto, may need to be switched to coumadin if creatinine worsens -on PO cardizem  -rate stable -Cardiology considering TEE with  cardioversion when volume status improved   HTN -BP remains stable at this time    Insulin-dependent diabetes mellitus with renal complications ([place]) -on lantus at home, now SSI  CBGS better now on increased lantus and meal coverage -Plan to continue to titrate insulin as needed with goal of euglycemia  DVT prophylaxis: Xarelto Code Status: Full Family Communication: Pt in room,family not at bedside Disposition Plan: Uncertain at this time  Consultants:   Cardiology  Nephrology  Procedures:     Antimicrobials: Anti-infectives    Start     Dose/Rate Route Frequency Ordered Stop   12/11/16 2200  doxycycline (VIBRA-TABS) tablet 100 mg     100 mg Oral Every 12 hours 12/11/16 1055     12/05/16 0800  doxycycline (VIBRAMYCIN) 100 mg in dextrose 5 % 250 mL IVPB  Status:  Discontinued     100 mg 125 mL/hr over 120 Minutes Intravenous Every 12 hours 12/05/16 0755 12/11/16 1055   12/02/16 2000  piperacillin-tazobactam (ZOSYN) IVPB 3.375 g  Status:  Discontinued     3.375 g 12.5 mL/hr over 240 Minutes Intravenous Every 8 hours 12/02/16 1724 12/03/16 0756   12/02/16 1800  clindamycin (CLEOCIN) IVPB 900 mg  Status:  Discontinued     900 mg 100 mL/hr over 30 Minutes Intravenous Every 8 hours 12/02/16 1707 12/03/16 0756   12/02/16 1430  piperacillin-tazobactam (ZOSYN) IVPB 3.375 g     3.375 g 100 mL/hr over 30 Minutes Intravenous  Once 12/02/16 1424 12/02/16 1517   12/02/16 1430  vancomycin (VANCOCIN) IVPB 1000 mg/200 mL premix     1,000 mg 200 mL/hr over 60 Minutes Intravenous  Once 12/02/16 1424 12/02/16 1704  Subjective: Reports feeling better today  Objective: Vitals:   12/10/16 1700 12/10/16 2201 12/11/16 0453 12/11/16 1231  BP:  113/82 117/78 114/76  Pulse:  93 87 89  Resp:  16 16   Temp: 99.1 F (37.3 C) 99.9 F (37.7 C) 100 F (37.8 C) 98.5 F (36.9 C)  TempSrc: Oral Oral Oral Oral  SpO2:  97% 97% 96%  Weight:   (!) 139.4 kg (307 lb 5.1 oz)   Height:         Intake/Output Summary (Last 24 hours) at 12/11/16 1454 Last data filed at 12/11/16 1000  Gross per 24 hour  Intake              502 ml  Output             1700 ml  Net            -1198 ml   Filed Weights   12/09/16 0500 12/10/16 0506 12/11/16 0453  Weight: (!) 141.6 kg (312 lb 2.7 oz) (!) 139.4 kg (307 lb 5.1 oz) (!) 139.4 kg (307 lb 5.1 oz)    Examination: General exam: Conversant, in no acute distress Respiratory system: normal chest rise, clear, no audible wheezing Cardiovascular system: regular rhythm, s1-s2 Gastrointestinal system: Nondistended, nontender, pos BS Central nervous system: No seizures, no tremors Extremities: No cyanosis, no joint deformities Skin: No rashes, no pallor Psychiatry: Affect normal // no auditory hallucinations    Data Reviewed: I have personally reviewed following labs and imaging studies  CBC:  Recent Labs Lab 12/05/16 0321 12/06/16 0334 12/08/16 0554 12/10/16 0533  WBC 17.3* 14.1* 16.9* 12.5*  HGB 12.4* 11.3* 12.7* 11.3*  HCT 38.8* 34.9* 39.7 35.1*  MCV 80.7 80.0 80.7 80.5  PLT 382 332 380 465*   Basic Metabolic Panel:  Recent Labs Lab 12/06/16 0334 12/07/16 0749 12/08/16 0554 12/10/16 0533 12/11/16 0441  NA 127* 131* 130* 130* 129*  K 3.2* 3.4* 3.5 3.4* 3.4*  CL 83* 85* 84* 83* 82*  CO2 31 32 32 32 33*  GLUCOSE 347* 155* 209* 119* 142*  BUN 112* 101* 99* 92* 96*  CREATININE 3.32* 3.02* 2.79* 2.84* 3.14*  CALCIUM 7.7* 8.7* 8.5* 8.3* 8.4*  MG  --   --   --   --  1.4*  PHOS  --   --   --  5.1* 5.5*   GFR: Estimated Creatinine Clearance: 42.8 mL/min (A) (by C-G formula based on SCr of 3.14 mg/dL (H)). Liver Function Tests:  Recent Labs Lab 12/10/16 0533 12/11/16 0441  ALBUMIN 2.2* 2.2*   No results for input(s): LIPASE, AMYLASE in the last 168 hours. No results for input(s): AMMONIA in the last 168 hours. Coagulation Profile: No results for input(s): INR, PROTIME in the last 168 hours. Cardiac Enzymes: No  results for input(s): CKTOTAL, CKMB, CKMBINDEX, TROPONINI in the last 168 hours. BNP (last 3 results) No results for input(s): PROBNP in the last 8760 hours. HbA1C: No results for input(s): HGBA1C in the last 72 hours. CBG:  Recent Labs Lab 12/10/16 1238 12/10/16 1854 12/10/16 2200 12/11/16 0746 12/11/16 1153  GLUCAP 107* 104* 161* 117* 94   Lipid Profile: No results for input(s): CHOL, HDL, LDLCALC, TRIG, CHOLHDL, LDLDIRECT in the last 72 hours. Thyroid Function Tests: No results for input(s): TSH, T4TOTAL, FREET4, T3FREE, THYROIDAB in the last 72 hours. Anemia Panel:  Recent Labs  12/11/16 0441  FERRITIN 266  TIBC 248*  IRON 21*   Sepsis Labs: No results for  input(s): PROCALCITON, LATICACIDVEN in the last 168 hours.  Recent Results (from the past 240 hour(s))  Culture, blood (Routine X 2) w Reflex to ID Panel     Status: None   Collection Time: 12/02/16  2:05 PM  Result Value Ref Range Status   Specimen Description BLOOD LEFT ANTECUBITAL  Final   Special Requests   Final    BOTTLES DRAWN AEROBIC AND ANAEROBIC Blood Culture results may not be optimal due to an inadequate volume of blood received in culture bottles   Culture   Final    NO GROWTH 5 DAYS Performed at [facility] [address] [place], [place] [address]    Report Status 12/07/2016 FINAL  Final  Culture, blood (Routine X 2) w Reflex to ID Panel     Status: None   Collection Time: 12/02/16  2:07 PM  Result Value Ref Range Status   Specimen Description BLOOD RIGHT ANTECUBITAL  Final   Special Requests   Final    BOTTLES DRAWN AEROBIC AND ANAEROBIC Blood Culture results may not be optimal due to an inadequate volume of blood received in culture bottles   Culture   Final    NO GROWTH 5 DAYS Performed at [facility], [place] [address], [place], [place] [address]    Report Status 12/07/2016 FINAL  Final  MRSA PCR Screening     Status: None   Collection Time: 12/02/16 10:01 PM  Result Value  Ref Range Status   MRSA by PCR NEGATIVE NEGATIVE Final    Comment:        The GeneXpert MRSA Assay (FDA approved for NASAL specimens only), is one component of a comprehensive MRSA colonization surveillance program. It is not intended to diagnose MRSA infection nor to guide or monitor treatment for MRSA infections.      Radiology Studies: No results found.  Scheduled Meds: . carvedilol  25 mg Oral BID WC  . colchicine  0.3 mg Oral Daily  . diltiazem  360 mg Oral Daily  . doxycycline  100 mg Oral Q12H  . gabapentin  300 mg Oral TID  . insulin aspart  0-9 Units Subcutaneous TID WC  . insulin aspart  6 Units Subcutaneous TID WC  . insulin glargine  30 Units Subcutaneous QHS  . metolazone  5 mg Oral Daily  . potassium chloride  20 mEq Oral BID  . rivaroxaban  15 mg Oral Q supper  . sodium chloride flush  3 mL Intravenous Q12H   Continuous Infusions: . sodium chloride    . furosemide Stopped (12/11/16 1331)  . magnesium sulfate 1 - 4 g bolus IVPB    . sodium chloride    . sodium chloride       LOS: 9 days   Arie Gable, Orpah Melter, MD [facility] Pager [phone]  If 7PM-7AM, please contact night-coverage [email or web address] Password [email or web address] 12/11/2016, 2:54 PM

## 2016-12-11 NOTE — Care Management Note (Signed)
Case Management Note  Patient Details  Name: John Bailey MRN: [id number] Date of Birth: November 24, 1970  Subjective/Objective: Noted OT-[facility]cc bariatric bedside commode-spoke to patient-he agrees w/[facility] rep Santiago Glad checked insurance co pay $39. [facility] rep Santiago Glad delivered 3n1 to rm already.                    Action/Plan:d/c plan home.   Expected Discharge Date:   (unknown)               Expected Discharge Plan:  Home/Self Care  In-House Referral:     Discharge planning Services  CM Consult  Post Acute Care Choice:    Choice offered to:  Patient  DME Arranged:  3-N-1 (bariatric bedside commode) DME Agency:  [place]:    [place] Agency:     Status of Service:  In process, will continue to follow  If discussed at Long Length of Stay Meetings, dates discussed:    Additional Comments:  Dessa Phi, RN 12/11/2016, 1:47 PM

## 2016-12-11 NOTE — Progress Notes (Signed)
Progress Note  Patient Name: John Bailey Date of Encounter: 12/11/2016  Primary Cardiologist: Dr. Debara Pickett  Subjective   Pt has walked in halls today.  Inpatient Medications    Scheduled Meds: . carvedilol  25 mg Oral BID WC  . colchicine  0.3 mg Oral Daily  . diltiazem  360 mg Oral Daily  . doxycycline  100 mg Oral Q12H  . gabapentin  300 mg Oral TID  . insulin aspart  0-9 Units Subcutaneous TID WC  . insulin aspart  6 Units Subcutaneous TID WC  . insulin glargine  30 Units Subcutaneous QHS  . metolazone  5 mg Oral Daily  . rivaroxaban  15 mg Oral Q supper  . sodium chloride flush  3 mL Intravenous Q12H   Continuous Infusions: . sodium chloride    . furosemide Stopped (12/11/16 0550)  . sodium chloride    . sodium chloride     PRN Meds: sodium chloride, acetaminophen **OR** acetaminophen, albuterol, levalbuterol, lip balm, menthol-cetylpyridinium, ondansetron **OR** ondansetron (ZOFRAN) IV, oxyCODONE, sodium chloride flush   Vital Signs    Vitals:   12/10/16 1600 12/10/16 1700 12/10/16 2201 12/11/16 0453  BP: 121/77  113/82 117/78  Pulse: 93  93 87  Resp: 15  16 16   Temp: (!) 100.6 F (38.1 C) 99.1 F (37.3 C) 99.9 F (37.7 C) 100 F (37.8 C)  TempSrc: Oral Oral Oral Oral  SpO2: 97%  97% 97%  Weight:    (!) 307 lb 5.1 oz (139.4 kg)  Height:        Intake/Output Summary (Last 24 hours) at 12/11/16 1109 Last data filed at 12/11/16 0748  Gross per 24 hour  Intake              502 ml  Output             1950 ml  Net            -1448 ml   Filed Weights   12/09/16 0500 12/10/16 0506 12/11/16 0453  Weight: (!) 312 lb 2.7 oz (141.6 kg) (!) 307 lb 5.1 oz (139.4 kg) (!) 307 lb 5.1 oz (139.4 kg)     Physical Exam   General: Well developed, well nourished, male appearing in no acute distress. Head: Normocephalic, atraumatic.  Neck: Supple without bruits, JVD. Lungs:  Resp regular and unlabored, CTA. Heart: irregular rhythm and regular rate, no murmur;  no rub. Abdomen: Soft, non-tender, non-distended with normoactive bowel sounds. No hepatomegaly. No rebound/guarding. No obvious abdominal masses. Extremities: No clubbing, cyanosis, 3+ edema. Distal pedal pulses are faint bilaterally. Neuro: Alert and oriented X 3. Moves all extremities spontaneously. Psych: Normal affect.  Labs    Chemistry Recent Labs Lab 12/08/16 0554 12/10/16 0533 12/11/16 0441  NA 130* 130* 129*  K 3.5 3.4* 3.4*  CL 84* 83* 82*  CO2 32 32 33*  GLUCOSE 209* 119* 142*  BUN 99* 92* 96*  CREATININE 2.79* 2.84* 3.14*  CALCIUM 8.5* 8.3* 8.4*  ALBUMIN  --  2.2* 2.2*  GFRNONAA 26* 25* 23*  GFRAA 30* 29* 26*  ANIONGAP 14 15 14      Hematology Recent Labs Lab 12/06/16 0334 12/08/16 0554 12/10/16 0533  WBC 14.1* 16.9* 12.5*  RBC 4.36 4.92 4.36  HGB 11.3* 12.7* 11.3*  HCT 34.9* 39.7 35.1*  MCV 80.0 80.7 80.5  MCH 25.9* 25.8* 25.9*  MCHC 32.4 32.0 32.2  RDW 16.2* 16.4* 16.3*  PLT 332 380 413*    Cardiac EnzymesNo  results for input(s): TROPONINI in the last 168 hours. No results for input(s): TROPIPOC in the last 168 hours.   BNPNo results for input(s): BNP, PROBNP in the last 168 hours.   DDimer No results for input(s): DDIMER in the last 168 hours.   Radiology    No results found.   Telemetry    Afib with bouts of RVR - Personally Reviewed  ECG    No new tracings - Personally Reviewed   Cardiac Studies   Echo 06/2016 Study Conclusions - Left ventricle: The cavity size is small. 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 Systolic function was normal. The estimated ejection fraction was in the range of 50% to 55%. Distal anterior/apical hypokinesis seen on Definity contrast images. Doppler parameters are consistent with restrictive physiology (grade 3 diastolic dysfunction). The E/A ratio is >2.5. The E/e&' ratio is >20, suggesting markedly elevated  LV filling pressure. - Mitral valve: Mitral B-notch noted, suggesting elevated LA pressure. - Left atrium: Moderately dilated. - Right ventricle: The cavity size was mildly dilated. Systolic function was normal. - Right atrium: Severely dilated. - Tricuspid valve: There was trivial regurgitation. - Pulmonary arteries: PA peak pressure: 19 mm Hg (S). - Inferior vena cava: The vessel was normal in size. The respirophasic diameter changes were in the normal range (>= 50%), consistent with normal central venous pressure. - Pericardium, extracardiac: Small pericardial effusion. Features were not consistent with tamponade physiology.  Impressions: - Compared to a prior study in 2017, the LVEF has increased to 50-55% with distal anterior/apical hypokinesis as seen on Definity contrast images.   Echocardiogram 07/31/15: Study Conclusions - Left ventricle: Poorly visualized. The cavity size was normal. There was severe concentric hypertrophy. Systolic function was moderately reduced. The estimated ejection fraction was in the range of 35% to 40%. Wall motion was normal; there were no regional wall motion abnormalities. Doppler parameters are consistent with a reversible restrictive pattern, indicative of decreased left ventricular diastolic compliance and/or increased left atrial pressure (grade 3 diastolic dysfunction). - Mitral valve: There was mild regurgitation. - Left atrium: The atrium was severely dilated. - Right ventricle: Systolic function was moderately reduced. - Right atrium: The atrium was moderately dilated. - Tricuspid valve: There was moderate regurgitation. - Pulmonary arteries: Systolic pressure was mildly increased. PA peak pressure: 34 mm Hg (S). - Pericardium, extracardiac: A small pericardial effusion was identified circumferential to the heart. There was no evidence of hemodynamic compromise.  Impressions: - EF reduced  when compared to prior (50%).  Patient Profile     46 y.o. male with chronic combined systolic and diastolic congestive heart failure. He also has chronic kidney disease admitted with decompensated CHF and back in a fib.  Assessment & Plan    1. Acute on chronic systolic and diastolic heart failure - BNP on admission was 554 with CK D stage IV - Diuresing on 160 mg IV Lasix TID, primary team added Zaroxolyn yesterday 5 mg - Overall net negative 8.7 L with 1.7 L urine output yesterday - Weight is 307 lbs from 326 lbs at peak - Continue diuresing; however, kidney function declining - see below - will ask nursing to wrap legs bilaterally instead of TED hose 2/ LE ulcer  2. Persistent A. Fib - Previously sinus rhythm in January 2018 by EKG and May 2018 by exam - Presented with A. fib RVR with right bundle branch block - Telemetry with bouts of RVR overnight, asymptomatic -  Currently on diltiazem 360 mg daily and Coreg 25 mg twice a day - This patients CHA2DS2-VASc Score and unadjusted Ischemic Stroke Rate (% per year) is equal to 3.2 % stroke rate/year from a score of 3 (CHF, HTN, DM) - Continue 15 mg xarelto daily  3. CKD stage IV - sCr 3.14 (2.84)   4. Hyponatremia  - Sodium 129 today, asymptomatic   5. IDDM - per primary team  6. Hypokalemia - K 3.4 After by mouth replacement - Continue by mouth K Dur per primary team - Gentle replacement given declining kidney function   Signed, Ledora Bottcher , PA-C 11:09 AM 12/11/2016 Pager: [phone]  History and all data above reviewed.  Patient examined.  I agree with the findings as above.  Feeling better than on admission.  He walked the halls.  Still not breathing at baseline.  Still with massive volume overload with creat increased.   The patient exam reveals ZDG:[id number]  ,  Lungs: Clear  ,  Abd: Positive bowel sounds, no rebound no guarding, Ext Severe edema  .  All available labs, radiology testing, previous records  reviewed. Agree with documented assessment and plan. Anasarca:  We will try to wrap the legs today.  Continue current diuresis.  Discussed with renal.  We are going to continue current diuretic today.     Jeneen Rinks Virga Haltiwanger  12:08 PM  12/11/2016

## 2016-12-11 NOTE — Evaluation (Signed)
Physical Therapy Evaluation Patient Details Name: John Bailey MRN: [id number] DOB: August 25, 1970 Today's Date: 12/11/2016   History of Present Illness  46 yo male admitted with A fib, HF.  Clinical Impression  On eval, pt required Min guard assist for mobility. He walked ~150 feet while holding on to IV pole. HR up to 137 bpm during ambulation, 108 bpm at rest. Do not anticipate any f/u PT needs at discharge. Recommend daily ambulation with nursing supervision. Will follow during hospital stay.     Follow Up Recommendations No PT follow up    Equipment Recommendations  Cane    Recommendations for Other Services       Precautions / Restrictions Precautions Precautions: None Restrictions Weight Bearing Restrictions: No      Mobility  Bed Mobility Overal bed mobility: Modified Independent                Transfers Overall transfer level: Needs assistance Equipment used: None Transfers: Sit to/from Stand Sit to Stand: Supervision         General transfer comment: for safety  Ambulation/Gait Ambulation/Gait assistance: Min guard Ambulation Distance (Feet): 150 Feet Assistive device:  (IV pole) Gait Pattern/deviations: Wide base of support;Step-to pattern;Antalgic     General Gait Details: close guard for safety. antalgic gait pattern-greater initially, improved with distance.   Stairs            Wheelchair Mobility    Modified Rankin (Stroke Patients Only)       Balance                                             Pertinent Vitals/Pain Pain Assessment: 0-10 Pain Score: 8  Pain Location: LLE  Pain Descriptors / Indicators: Aching;Grimacing;Discomfort Pain Intervention(s): Limited activity within patient's tolerance;Repositioned    Home Living Family/patient expects to be discharged to:: Private residence Living Arrangements: Spouse/significant other;Children Available Help at Discharge: Family Type of Home: House Home  Access: Level entry     Home Layout: Multi-level Home Equipment: Kasandra Knudsen - single point      Prior Function Level of Independence: Independent         Comments: pt is unsure if he still has the cane at home     Hand Dominance        Extremity/Trunk Assessment   Upper Extremity Assessment Upper Extremity Assessment: Overall WFL for tasks assessed    Lower Extremity Assessment Lower Extremity Assessment: Generalized weakness (L LE pain)    Cervical / Trunk Assessment Cervical / Trunk Assessment: Normal  Communication   Communication: No difficulties  Cognition Arousal/Alertness: Awake/alert Behavior During Therapy: WFL for tasks assessed/performed Overall Cognitive Status: Within Functional Limits for tasks assessed                                        General Comments      Exercises     Assessment/Plan    PT Assessment Patient needs continued PT services  PT Problem List Decreased mobility;Decreased activity tolerance;Decreased balance;Decreased knowledge of use of DME;Pain;Decreased skin integrity       PT Treatment Interventions DME instruction;Gait training;Therapeutic activities;Therapeutic exercise;Patient/family education;Balance training;Functional mobility training    PT Goals (Current goals can be found in the Care Plan section)  Acute Rehab PT Goals Patient  Stated Goal: regain independence  PT Goal Formulation: With patient Time For Goal Achievement: 12/25/16 Potential to Achieve Goals: Good    Frequency Min 3X/week   Barriers to discharge        Co-evaluation               AM-PAC PT "6 Clicks" Daily Activity  Outcome Measure Difficulty turning over in bed (including adjusting bedclothes, sheets and blankets)?: None Difficulty moving from lying on back to sitting on the side of the bed? : None Difficulty sitting down on and standing up from a chair with arms (e.g., wheelchair, bedside commode, etc,.)?: A  Little Help needed moving to and from a bed to chair (including a wheelchair)?: A Little Help needed walking in hospital room?: A Little Help needed climbing 3-5 steps with a railing? : A Little 6 Click Score: 20    End of Session   Activity Tolerance: Patient tolerated treatment well Patient left: in bed;with call bell/phone within reach   PT Visit Diagnosis: Muscle weakness (generalized) (M62.81);Difficulty in walking, not elsewhere classified (R26.2)    Time: 2440-1027 PT Time Calculation (min) (ACUTE ONLY): 16 min   Charges:   PT Evaluation $PT Eval Low Complexity: 1 Low     PT G Codes:          Weston Anna, MPT Pager: [phone]

## 2016-12-12 LAB — RENAL FUNCTION PANEL
ALBUMIN: 2.2 g/dL — AB (ref 3.5–5.0)
Anion gap: 17 — ABNORMAL HIGH (ref 5–15)
BUN: 94 mg/dL — AB (ref 6–20)
CALCIUM: 8.7 mg/dL — AB (ref 8.9–10.3)
CO2: 31 mmol/L (ref 22–32)
CREATININE: 3.12 mg/dL — AB (ref 0.61–1.24)
Chloride: 84 mmol/L — ABNORMAL LOW (ref 101–111)
GFR calc Af Amer: 26 mL/min — ABNORMAL LOW (ref >60)
GFR, EST NON AFRICAN AMERICAN: 23 mL/min — AB (ref >60)
Glucose, Bld: 125 mg/dL — ABNORMAL HIGH (ref 65–99)
PHOSPHORUS: 4.9 mg/dL — AB (ref 2.5–4.6)
POTASSIUM: 4 mmol/L (ref 3.5–5.1)
SODIUM: 132 mmol/L — AB (ref 135–145)

## 2016-12-12 LAB — GLUCOSE, CAPILLARY
GLUCOSE-CAPILLARY: 156 mg/dL — AB (ref 65–99)
Glucose-Capillary: 127 mg/dL — ABNORMAL HIGH (ref 65–99)
Glucose-Capillary: 150 mg/dL — ABNORMAL HIGH (ref 65–99)
Glucose-Capillary: 172 mg/dL — ABNORMAL HIGH (ref 65–99)

## 2016-12-12 LAB — MAGNESIUM: Magnesium: 1.8 mg/dL (ref 1.7–2.4)

## 2016-12-12 MED ORDER — DILTIAZEM HCL ER COATED BEADS 240 MG PO CP24
420.0000 mg | ORAL_CAPSULE | Freq: Every day | ORAL | Status: DC
  Administered 2016-12-12 – 2016-12-16 (×5): 420 mg via ORAL
  Filled 2016-12-12 (×5): qty 1

## 2016-12-12 MED ORDER — MAGNESIUM SULFATE 2 GM/50ML IV SOLN
2.0000 g | Freq: Once | INTRAVENOUS | Status: AC
  Administered 2016-12-12: 2 g via INTRAVENOUS
  Filled 2016-12-12: qty 50

## 2016-12-12 NOTE — Progress Notes (Signed)
Pt. Requested for bilateral LE ACE wrap bandages to be removed for a couple of hours for relief. RN returned to room for dressing change at 0430 and pt. Refused reapplication of bilat LE ACE wrap bandages. RN educated pt. And pt. States he understands. Pt. Encouraged to call RN before 0600 to reapply bandages. Dressing changed and bilat LE elevated with pillows. Will continue to monitor.

## 2016-12-12 NOTE — Progress Notes (Addendum)
Patient ID: John Bailey, male   DOB: 1970-11-15, 46 y.o.   MRN: [id number] S: No new complaints and continues to diurese.   O:BP 125/75 (BP Location: Right Arm)   Pulse 81   Temp 100.1 F (37.8 C) (Oral)   Resp 20   Ht 5\' 11"  (1.803 m)   Wt (!) 137 kg (302 lb 1.6 oz)   SpO2 93%   BMI 42.13 kg/m   Intake/Output Summary (Last 24 hours) at 12/12/16 0845 Last data filed at 12/12/16 0418  Gross per 24 hour  Intake              240 ml  Output             1125 ml  Net             -885 ml   Intake/Output: I/O last 3 completed shifts: In: 403 [P.O.:360; IV Piggyback:382] Out: 2825 [Urine:2825]  Intake/Output this shift:  No intake/output data recorded. Weight change: -2.368 kg (-5 lb 3.5 oz) Gen:NAD CVS: IRR IRR Resp:cta KVQ:QVZDGL Ext:1+ edema on right lower ext, 2+ on left   Recent Labs Lab 12/06/16 0334 12/07/16 0749 12/08/16 0554 12/10/16 0533 12/11/16 0441 12/12/16 0446  NA 127* 131* 130* 130* 129* 132*  K 3.2* 3.4* 3.5 3.4* 3.4* 4.0  CL 83* 85* 84* 83* 82* 84*  CO2 31 32 32 32 33* 31  GLUCOSE 347* 155* 209* 119* 142* 125*  BUN 112* 101* 99* 92* 96* 94*  CREATININE 3.32* 3.02* 2.79* 2.84* 3.14* 3.12*  ALBUMIN  --   --   --  2.2* 2.2* 2.2*  CALCIUM 7.7* 8.7* 8.5* 8.3* 8.4* 8.7*  PHOS  --   --   --  5.1* 5.5* 4.9*   Liver Function Tests:  Recent Labs Lab 12/10/16 0533 12/11/16 0441 12/12/16 0446  ALBUMIN 2.2* 2.2* 2.2*   No results for input(s): LIPASE, AMYLASE in the last 168 hours. No results for input(s): AMMONIA in the last 168 hours. CBC:  Recent Labs Lab 12/06/16 0334 12/08/16 0554 12/10/16 0533  WBC 14.1* 16.9* 12.5*  HGB 11.3* 12.7* 11.3*  HCT 34.9* 39.7 35.1*  MCV 80.0 80.7 80.5  PLT 332 380 413*   Cardiac Enzymes: No results for input(s): CKTOTAL, CKMB, CKMBINDEX, TROPONINI in the last 168 hours. CBG:  Recent Labs Lab 12/11/16 0746 12/11/16 1153 12/11/16 1747 12/11/16 2233 12/12/16 0802  GLUCAP 117* 94 83 117* 127*     Iron Studies:  Recent Labs  12/11/16 0441  IRON 21*  TIBC 248*  FERRITIN 266   Studies/Results: No results found. . carvedilol  25 mg Oral BID WC  . colchicine  0.3 mg Oral Daily  . diltiazem  420 mg Oral Daily  . doxycycline  100 mg Oral Q12H  . gabapentin  300 mg Oral TID  . insulin aspart  0-9 Units Subcutaneous TID WC  . insulin aspart  6 Units Subcutaneous TID WC  . insulin glargine  30 Units Subcutaneous QHS  . metolazone  5 mg Oral Daily  . potassium chloride  20 mEq Oral BID  . rivaroxaban  15 mg Oral Q supper  . sodium chloride flush  3 mL Intravenous Q12H    BMET    Component Value Date/Time   NA 132 (L) 12/12/2016 0446   K 4.0 12/12/2016 0446   CL 84 (L) 12/12/2016 0446   CO2 31 12/12/2016 0446   GLUCOSE 125 (H) 12/12/2016 0446   BUN 94 (H) 12/12/2016  0446   CREATININE 3.12 (H) 12/12/2016 0446   CALCIUM 8.7 (L) 12/12/2016 0446   GFRNONAA 23 (L) 12/12/2016 0446   GFRAA 26 (L) 12/12/2016 0446   CBC    Component Value Date/Time   WBC 12.5 (H) 12/10/2016 0533   RBC 4.36 12/10/2016 0533   HGB 11.3 (L) 12/10/2016 0533   HCT 35.1 (L) 12/10/2016 0533   PLT 413 (H) 12/10/2016 0533   MCV 80.5 12/10/2016 0533   MCH 25.9 (L) 12/10/2016 0533   MCHC 32.2 12/10/2016 0533   RDW 16.3 (H) 12/10/2016 0533   LYMPHSABS 2.2 12/02/2016 1405   MONOABS 1.1 (H) 12/02/2016 1405   EOSABS 0.0 12/02/2016 1405   BASOSABS 0.0 12/02/2016 1405     Assessment/Plan:  1. AKI/CKD stage 4 in setting of decompensated CHF. Scr continues to slowly trend toward baseline of 2.5. Responding to IV lasxi and po zaroxolyn. Stressed fluid and sodium restriction. 1. Will need to learn about dialysis options and will need vascular access placement as an outpatient given his progressive CKD and recurrent issues with decompensated CHF 2. Volume overload/anasarca- as above but no change in weight overnight.  I/O negative 1.2l and 8.7 since admission 1. Continue with lasix 160mg  tid and  metolazone for now and follow renal function.  2. Hopefully we can start to decrease IV lasix dose and/or frequency on Monday before transitioning to po diuretics 3. Will see him again Monday and follow I's/Os and labs 3. NICM- EF 35% 4. A fib with RVR- per cardiology on dilt and possible TEE/DCCV when euvolemic, on xarelto 15mg  daily 5. Fever- pt has had an elevated temp for the last 2 days, however WBC falling.  1. Repeat cultures 6. Hypokalemia- replete and follow  7. DM type 2 on insulin 8. Obesity 9. HTN- stable Donetta Potts, MD [employer] [phone]

## 2016-12-12 NOTE — Progress Notes (Signed)
PROGRESS NOTE    John KELEMEN  CVE:[id number] DOB: September 01, 1970 DOA: 12/02/2016 PCP: [name], MD    Brief Narrative:  46 y.o.malewith a hx of diabetes, hypertension, atrial fibrillation on Xarelto, chronic systolic and diastolic CHF,  CKD4 admitted with severe vol overload, AKI and Afib RVR. 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 -Continue to encourage ambulation, OOB -cardiology following, patient to continue Lasix -Nephrology to consider dialysis options, plans to review vascular axis placement options as an outpatient given his progressive CK D.    AKi on CKD4 -baseline creatinine around 2.5 -due to DM and hypertensive nephropathy and now with cardiorenal syndrome -Patient is continued on high dose IV lasix per Renal -Appreciate input by nephrology -Continue to monitor I's and O's and daily weights. -Recheck basic metabolic panel morning  Secondary LLE cellulitis -improving, continue on doxycycline as tolerated -monitor -Remains afebrile -Currently on oral doxycycline, tolerating.    Paroxysmal atrial fibrillation (HCC) -with RVR on cardizem gtt -chadsvasc score is 5, on xarelto, may need to be switched to coumadin  if creatinine worsens -on PO cardizem  -rate stable -Cardiology considering TEE with cardioversion when volume status improved   HTN -BP remains stable at this time    Insulin-dependent diabetes mellitus with renal complications ([place]) -on lantus at home, now SSI  CBGS better now on increased lantus and meal coverage -Presently stable  DVT prophylaxis: Xarelto Code Status: Full Family Communication: Pt in room,family not at bedside Disposition Plan: Uncertain at this time  Consultants:   Cardiology  Nephrology  Procedures:     Antimicrobials: Anti-infectives    Start     Dose/Rate Route Frequency Ordered Stop   12/11/16 2200  doxycycline (VIBRA-TABS) tablet 100 mg     100 mg Oral Every 12 hours 12/11/16 1055     12/05/16 0800  doxycycline (VIBRAMYCIN) 100 mg in dextrose 5 % 250 mL IVPB  Status:  Discontinued     100 mg 125 mL/hr over 120 Minutes Intravenous Every 12 hours 12/05/16 0755 12/11/16 1055   12/02/16 2000  piperacillin-tazobactam (ZOSYN) IVPB 3.375 g  Status:  Discontinued     3.375 g 12.5 mL/hr over 240 Minutes Intravenous Every 8 hours 12/02/16 1724 12/03/16 0756   12/02/16 1800  clindamycin (CLEOCIN) IVPB 900 mg  Status:  Discontinued     900 mg 100 mL/hr over 30 Minutes Intravenous Every 8 hours 12/02/16 1707 12/03/16 0756   12/02/16 1430  piperacillin-tazobactam (ZOSYN) IVPB 3.375 g     3.375 g 100 mL/hr over 30 Minutes Intravenous  Once 12/02/16 1424 12/02/16 1517   12/02/16 1430  vancomycin (VANCOCIN) IVPB 1000 mg/200 mL premix     1,000 mg 200 mL/hr over 60 Minutes Intravenous  Once 12/02/16 1424 12/02/16 1704  Subjective: Without complaints today. Reports improvement.  Objective: Vitals:   12/11/16 2236 12/12/16 0419 12/12/16 0422 12/12/16 1400  BP: 110/85 125/75  109/83  Pulse: (!) 106 81  (!) 101  Resp: 20 20  18   Temp: 100 F (37.8 C) 100.1 F (37.8 C)  98.5 F (36.9 C)  TempSrc: Oral Oral  Oral  SpO2: 91% 93%  93%  Weight:    (!) 137 kg (302 lb 1.6 oz)   Height:        Intake/Output Summary (Last 24 hours) at 12/12/16 1749 Last data filed at 12/12/16 1500  Gross per 24 hour  Intake              480 ml  Output             2525 ml  Net            -2045 ml   Filed Weights   12/10/16 0506 12/11/16 0453 12/12/16 0422  Weight: (!) 139.4 kg (307 lb 5.1 oz) (!) 139.4 kg (307 lb 5.1 oz) (!) 137 kg (302 lb 1.6 oz)    Examination: General exam: Awake, laying in bed, in nad Respiratory system: Normal respiratory effort, no wheezing Cardiovascular system: regular rate, s1, s2 Gastrointestinal system: Soft, nondistended, positive BS Central nervous system: CN2-12 grossly intact, strength intact Extremities: Perfused, no clubbing Skin: Normal skin turgor, no notable skin lesions seen Psychiatry: Mood normal // no visual hallucinations   Data Reviewed: I have personally reviewed following labs and imaging studies  CBC:  Recent Labs Lab 12/06/16 0334 12/08/16 0554 12/10/16 0533  WBC 14.1* 16.9* 12.5*  HGB 11.3* 12.7* 11.3*  HCT 34.9* 39.7 35.1*  MCV 80.0 80.7 80.5  PLT 332 380 245*   Basic Metabolic Panel:  Recent Labs Lab 12/07/16 0749 12/08/16 0554 12/10/16 0533 12/11/16 0441 12/12/16 0446  NA 131* 130* 130* 129* 132*  K 3.4* 3.5 3.4* 3.4* 4.0  CL 85* 84* 83* 82* 84*  CO2 32 32 32 33* 31  GLUCOSE 155* 209* 119* 142* 125*  BUN 101* 99* 92* 96* 94*  CREATININE 3.02* 2.79* 2.84* 3.14* 3.12*  CALCIUM 8.7* 8.5* 8.3* 8.4* 8.7*  MG  --   --   --  1.4* 1.8  PHOS  --   --  5.1* 5.5* 4.9*   GFR: Estimated Creatinine Clearance: 42.7 mL/min (A) (by C-G formula based on SCr of 3.12 mg/dL (H)). Liver Function Tests:  Recent Labs Lab 12/10/16 0533 12/11/16 0441 12/12/16 0446  ALBUMIN 2.2* 2.2* 2.2*   No results for input(s): LIPASE, AMYLASE in the last 168 hours. No results for input(s): AMMONIA in the last 168 hours. Coagulation Profile: No results for input(s): INR, PROTIME in the last 168  hours. Cardiac Enzymes: No results for input(s): CKTOTAL, CKMB, CKMBINDEX, TROPONINI in the last 168 hours. BNP (last 3 results) No results for input(s): PROBNP in the last 8760 hours. HbA1C: No results for input(s): HGBA1C in the last 72 hours. CBG:  Recent Labs Lab 12/11/16 1747 12/11/16 2233 12/12/16 0802 12/12/16 1255 12/12/16 1715  GLUCAP 83 117* 127* 150* 156*   Lipid Profile: No results for input(s): CHOL, HDL, LDLCALC, TRIG, CHOLHDL, LDLDIRECT in the last 72 hours. Thyroid Function Tests: No results for input(s): TSH, T4TOTAL, FREET4, T3FREE, THYROIDAB in the last 72 hours. Anemia Panel:  Recent Labs  12/11/16 0441  FERRITIN 266  TIBC 248*  IRON 21*   Sepsis Labs: No results for input(s): PROCALCITON, LATICACIDVEN in the last  168 hours.  Recent Results (from the past 240 hour(s))  MRSA PCR Screening     Status: None   Collection Time: 12/02/16 10:01 PM  Result Value Ref Range Status   MRSA by PCR NEGATIVE NEGATIVE Final    Comment:        The GeneXpert MRSA Assay (FDA approved for NASAL specimens only), is one component of a comprehensive MRSA colonization surveillance program. It is not intended to diagnose MRSA infection nor to guide or monitor treatment for MRSA infections.      Radiology Studies: No results found.  Scheduled Meds: . carvedilol  25 mg Oral BID WC  . colchicine  0.3 mg Oral Daily  . diltiazem  420 mg Oral Daily  . doxycycline  100 mg Oral Q12H  . gabapentin  300 mg Oral TID  . insulin aspart  0-9 Units Subcutaneous TID WC  . insulin aspart  6 Units Subcutaneous TID WC  . insulin glargine  30 Units Subcutaneous QHS  . metolazone  5 mg Oral Daily  . potassium chloride  20 mEq Oral BID  . rivaroxaban  15 mg Oral Q supper  . sodium chloride flush  3 mL Intravenous Q12H   Continuous Infusions: . sodium chloride    . furosemide Stopped (12/12/16 1300)  . sodium chloride    . sodium chloride       LOS: 10 days   CHIU,  Orpah Melter, MD [facility] Pager [phone]  If 7PM-7AM, please contact night-coverage [email or web address] Password [facility] 12/12/2016, 5:49 PM

## 2016-12-12 NOTE — Progress Notes (Signed)
Progress Note  Patient Name: John Bailey Date of Encounter: 12/12/2016   Subjective   No SOB, no palpitations.   Inpatient Medications    Scheduled Meds: . carvedilol  25 mg Oral BID WC  . colchicine  0.3 mg Oral Daily  . diltiazem  360 mg Oral Daily  . doxycycline  100 mg Oral Q12H  . gabapentin  300 mg Oral TID  . insulin aspart  0-9 Units Subcutaneous TID WC  . insulin aspart  6 Units Subcutaneous TID WC  . insulin glargine  30 Units Subcutaneous QHS  . metolazone  5 mg Oral Daily  . potassium chloride  20 mEq Oral BID  . rivaroxaban  15 mg Oral Q supper  . sodium chloride flush  3 mL Intravenous Q12H   Continuous Infusions: . sodium chloride    . furosemide Stopped (12/12/16 0518)  . sodium chloride    . sodium chloride     PRN Meds: sodium chloride, acetaminophen **OR** acetaminophen, albuterol, levalbuterol, lip balm, menthol-cetylpyridinium, ondansetron **OR** ondansetron (ZOFRAN) IV, oxyCODONE, sodium chloride flush   Vital Signs    Vitals:   12/11/16 1231 12/11/16 2236 12/12/16 0419 12/12/16 0422  BP: 114/76 110/85 125/75   Pulse: 89 (!) 106 81   Resp:  20 20   Temp: 98.5 F (36.9 C) 100 F (37.8 C) 100.1 F (37.8 C)   TempSrc: Oral Oral Oral   SpO2: 96% 91% 93%   Weight:    (!) 302 lb 1.6 oz (137 kg)  Height:        Intake/Output Summary (Last 24 hours) at 12/12/16 0756 Last data filed at 12/12/16 0418  Gross per 24 hour  Intake              240 ml  Output             1125 ml  Net             -885 ml   Filed Weights   12/10/16 0506 12/11/16 0453 12/12/16 0422  Weight: (!) 307 lb 5.1 oz (139.4 kg) (!) 307 lb 5.1 oz (139.4 kg) (!) 302 lb 1.6 oz (137 kg)    Telemetry    afb with elevated rates 100-120s - Personally Reviewed  ECG    n/a  Physical Exam   GEN: No acute distress.   Neck: No JVD Cardiac: irreg,tachycardic, elevated JVD Respiratory: mild crackles bilateral bases GI: Soft, nontender, non-distended  MS: No edema; No  deformity. Neuro:  Nonfocal  Psych: Normal affect   Labs    Chemistry Recent Labs Lab 12/10/16 0533 12/11/16 0441 12/12/16 0446  NA 130* 129* 132*  K 3.4* 3.4* 4.0  CL 83* 82* 84*  CO2 32 33* 31  GLUCOSE 119* 142* 125*  BUN 92* 96* 94*  CREATININE 2.84* 3.14* 3.12*  CALCIUM 8.3* 8.4* 8.7*  ALBUMIN 2.2* 2.2* 2.2*  GFRNONAA 25* 23* 23*  GFRAA 29* 26* 26*  ANIONGAP 15 14 17*     Hematology Recent Labs Lab 12/06/16 0334 12/08/16 0554 12/10/16 0533  WBC 14.1* 16.9* 12.5*  RBC 4.36 4.92 4.36  HGB 11.3* 12.7* 11.3*  HCT 34.9* 39.7 35.1*  MCV 80.0 80.7 80.5  MCH 25.9* 25.8* 25.9*  MCHC 32.4 32.0 32.2  RDW 16.2* 16.4* 16.3*  PLT 332 380 413*    Cardiac EnzymesNo results for input(s): TROPONINI in the last 168 hours. No results for input(s): TROPIPOC in the last 168 hours.   BNPNo results for  input(s): BNP, PROBNP in the last 168 hours.   DDimer No results for input(s): DDIMER in the last 168 hours.   Radiology    No results found.  Cardiac Studies     Patient Profile     46 y.o. male with chronic combined systolic and diastolic congestive heart failure. He also has chronic kidney disease admitted with decompensated CHF and back in a fib.  Assessment & Plan    1. Acute on diastolic HF - 04/3534 echo LVEF 50-55%, restrictive diastolic function. Prior LVEF 07/2015 as low as 35-40% - negative 1.4 liters yesterday, negative 10.1 liters since admission. He is on lasix IV 160mg  tid, metolazone 5mg  daily. Mild uptrend in Cr. Diuretic dosing with assistance from nephrology  2. Persistent afib - - Previously sinus rhythm in January 2018 by EKG and May 2018 by exam - Presented with A. fib RVR with right bundle John Bailey block - he is on xarelto for stroke prevention. Rate control with dilt and coreg - may consider cardioversion once more euvolemic.  - increase dilt to 420mg  daily, still with elevated rates by telemetry  3. CKD IV - followed by nephrology   We will  follow telemetry and afib rates over the weekend, defer diuretic dosing to nephrology.    For questions or updates, please contact [place] Please consult [email or web address] for contact info under Cardiology/STEMI.      Merrily Pew, MD  12/12/2016, 7:56 AM

## 2016-12-13 LAB — RENAL FUNCTION PANEL
ALBUMIN: 2.3 g/dL — AB (ref 3.5–5.0)
ANION GAP: 15 (ref 5–15)
BUN: 92 mg/dL — AB (ref 6–20)
CHLORIDE: 83 mmol/L — AB (ref 101–111)
CO2: 35 mmol/L — ABNORMAL HIGH (ref 22–32)
Calcium: 8.8 mg/dL — ABNORMAL LOW (ref 8.9–10.3)
Creatinine, Ser: 3.17 mg/dL — ABNORMAL HIGH (ref 0.61–1.24)
GFR, EST AFRICAN AMERICAN: 26 mL/min — AB (ref >60)
GFR, EST NON AFRICAN AMERICAN: 22 mL/min — AB (ref >60)
Glucose, Bld: 77 mg/dL (ref 65–99)
PHOSPHORUS: 5.4 mg/dL — AB (ref 2.5–4.6)
POTASSIUM: 3.8 mmol/L (ref 3.5–5.1)
Sodium: 133 mmol/L — ABNORMAL LOW (ref 135–145)

## 2016-12-13 LAB — URINE CULTURE: Culture: <10000 — AB

## 2016-12-13 LAB — GLUCOSE, CAPILLARY
GLUCOSE-CAPILLARY: 97 mg/dL (ref 65–99)
Glucose-Capillary: 75 mg/dL (ref 65–99)
Glucose-Capillary: 178 mg/dL — ABNORMAL HIGH (ref 65–99)

## 2016-12-13 MED ORDER — BENZONATATE 100 MG PO CAPS
100.0000 mg | ORAL_CAPSULE | Freq: Three times a day (TID) | ORAL | Status: DC | PRN
  Administered 2016-12-14: 100 mg via ORAL
  Filled 2016-12-13: qty 1

## 2016-12-13 MED ORDER — CARVEDILOL 12.5 MG PO TABS
12.5000 mg | ORAL_TABLET | Freq: Once | ORAL | Status: AC
  Administered 2016-12-13: 12.5 mg via ORAL
  Filled 2016-12-13: qty 1

## 2016-12-13 MED ORDER — CARVEDILOL 25 MG PO TABS
37.5000 mg | ORAL_TABLET | Freq: Two times a day (BID) | ORAL | Status: DC
  Administered 2016-12-13 – 2016-12-16 (×6): 37.5 mg via ORAL
  Filled 2016-12-13 (×6): qty 1

## 2016-12-13 NOTE — Progress Notes (Signed)
Pt. CBG 88, 30 units of lantus held. On call MD Blount paged and made aware. Verbal orders given to go ahead and give lantus. MD made aware that the patient did not eat dinner and is refusing any food for tonight. Will administer insulin and continue to monitor pt.

## 2016-12-13 NOTE — Progress Notes (Signed)
PROGRESS NOTE    John Bailey  IPJ:[id number] DOB: 1970-10-22 DOA: 12/02/2016 PCP: [name], MD    Brief Narrative:  46 y.o.malewith a hx of diabetes, hypertension, atrial fibrillation on Xarelto, chronic systolic and diastolic CHF,  CKD4 admitted with severe vol overload, AKI and Afib RVR. 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 -Continue to encourage ambulation, OOB -Nephrology to consider dialysis options, plans to review vascular axis placement options as an outpatient given his progressive CK D. -Steady improvement. Still volume overloaded. Nephrology and [facility]ilogy following    AKi on CKD4 -baseline creatinine around 2.5 -due to DM and hypertensive nephropathy and now with cardiorenal syndrome -Patient is continued on high dose IV lasix per Renal -Appreciate input by nephrology -Continue to monitor I's and O's and daily weights. -Repeat bmet in AM  Secondary LLE cellulitis -improving, continue on doxycycline as tolerated -monitor -Remains afebrile -Currently on oral doxycycline, tolerating. Stable at present    Paroxysmal atrial fibrillation (HCC) -with RVR on cardizem gtt -chadsvasc score is 5, on xarelto, may  need to be switched to coumadin if creatinine worsens -on PO cardizem  -currently rate controlled -Cardiology considering TEE with cardioversion when volume status improved   HTN -BP currently stable at this time   Insulin-dependent diabetes mellitus with renal complications ([place]) -on lantus at home, now SSI  CBGS better now on increased lantus and meal coverage -Currently stable  DVT prophylaxis: Xarelto Code Status: Full Family Communication: Pt in room,family not at bedside Disposition Plan: Uncertain at this time  Consultants:   Cardiology  Nephrology  Procedures:     Antimicrobials: Anti-infectives    Start     Dose/Rate Route Frequency Ordered Stop   12/11/16 2200  doxycycline (VIBRA-TABS) tablet 100 mg     100 mg Oral Every 12 hours 12/11/16 1055     12/05/16 0800  doxycycline (VIBRAMYCIN) 100 mg in dextrose 5 % 250 mL IVPB  Status:  Discontinued     100 mg 125 mL/hr over 120 Minutes Intravenous Every 12 hours 12/05/16 0755 12/11/16 1055   12/02/16 2000  piperacillin-tazobactam (ZOSYN) IVPB 3.375 g  Status:  Discontinued     3.375 g 12.5 mL/hr over 240 Minutes Intravenous Every 8 hours 12/02/16 1724 12/03/16 0756   12/02/16 1800  clindamycin (CLEOCIN) IVPB 900 mg  Status:  Discontinued     900 mg 100 mL/hr over 30 Minutes Intravenous Every 8 hours 12/02/16 1707 12/03/16 0756   12/02/16 1430  piperacillin-tazobactam (ZOSYN) IVPB 3.375 g     3.375 g 100 mL/hr over 30 Minutes Intravenous  Once 12/02/16 1424 12/02/16 1517   12/02/16 1430  vancomycin (VANCOCIN) IVPB 1000 mg/200 mL premix     1,000 mg 200 mL/hr over 60 Minutes Intravenous  Once  12/02/16 1424 12/02/16 1704      Subjective: Reports increased cough recently  Objective: Vitals:   12/12/16 1400 12/12/16 2159 12/13/16 0455 12/13/16 1415  BP: 109/83 112/70 132/84 125/84  Pulse: (!) 101 89 (!) 106 96  Resp: 18 17 18 18   Temp: 98.5 F (36.9 C) 99.1 F (37.3 C) 99.1 F (37.3 C) 98.5 F (36.9  C)  TempSrc: Oral Oral Oral Oral  SpO2: 93% 94% 96% 98%  Weight:   135.3 kg (298 lb 3.2 oz)   Height:        Intake/Output Summary (Last 24 hours) at 12/13/16 1723 Last data filed at 12/13/16 1300  Gross per 24 hour  Intake              240 ml  Output             2600 ml  Net            -2360 ml   Filed Weights   12/11/16 0453 12/12/16 0422 12/13/16 0455  Weight: (!) 139.4 kg (307 lb 5.1 oz) (!) 137 kg (302 lb 1.6 oz) 135.3 kg (298 lb 3.2 oz)    Examination: General exam: Conversant, in no acute distress Respiratory system: normal chest rise, clear, no audible wheezing Cardiovascular system: regular rhythm, s1-s2 Gastrointestinal system: Nondistended, nontender, pos BS Central nervous system: No seizures, no tremors Extremities: No cyanosis, no joint deformities Skin: No rashes, no pallor Psychiatry: Affect normal // no auditory hallucinations    Data Reviewed: I have personally reviewed following labs and imaging studies  CBC:  Recent Labs Lab 12/08/16 0554 12/10/16 0533  WBC 16.9* 12.5*  HGB 12.7* 11.3*  HCT 39.7 35.1*  MCV 80.7 80.5  PLT 380 742*   Basic Metabolic Panel:  Recent Labs Lab 12/08/16 0554 12/10/16 0533 12/11/16 0441 12/12/16 0446 12/13/16 0625  NA 130* 130* 129* 132* 133*  K 3.5 3.4* 3.4* 4.0 3.8  CL 84* 83* 82* 84* 83*  CO2 32 32 33* 31 35*  GLUCOSE 209* 119* 142* 125* 77  BUN 99* 92* 96* 94* 92*  CREATININE 2.79* 2.84* 3.14* 3.12* 3.17*  CALCIUM 8.5* 8.3* 8.4* 8.7* 8.8*  MG  --   --  1.4* 1.8  --   PHOS  --  5.1* 5.5* 4.9* 5.4*   GFR: Estimated Creatinine Clearance: 41.8 mL/min (A) (by C-G formula based on SCr of 3.17 mg/dL (H)). Liver Function Tests:  Recent Labs Lab 12/10/16 0533 12/11/16 0441 12/12/16 0446 12/13/16 0625  ALBUMIN 2.2* 2.2* 2.2* 2.3*   No results for input(s): LIPASE, AMYLASE in the last 168 hours. No results for input(s): AMMONIA in the last 168 hours. Coagulation Profile: No results for input(s):  INR, PROTIME in the last 168 hours. Cardiac Enzymes: No results for input(s): CKTOTAL, CKMB, CKMBINDEX, TROPONINI in the last 168 hours. BNP (last 3 results) No results for input(s): PROBNP in the last 8760 hours. HbA1C: No results for input(s): HGBA1C in the last 72 hours. CBG:  Recent Labs Lab 12/12/16 1255 12/12/16 1715 12/12/16 2158 12/13/16 0726 12/13/16 1139  GLUCAP 150* 156* 172* 75 178*   Lipid Profile: No results for input(s): CHOL, HDL, LDLCALC, TRIG, CHOLHDL, LDLDIRECT in the last 72 hours. Thyroid Function Tests: No results for input(s): TSH, T4TOTAL, FREET4, T3FREE, THYROIDAB in the last 72 hours. Anemia Panel:  Recent Labs  12/11/16 0441  FERRITIN 266  TIBC 248*  IRON 21*   Sepsis Labs: No results for input(s): PROCALCITON, LATICACIDVEN in the  last 168 hours.  Recent Results (from the past 240 hour(s))  Urine Culture     Status: Abnormal   Collection Time: 12/12/16  8:51 AM  Result Value Ref Range Status   Specimen Description URINE, CLEAN CATCH  Final   Special Requests NONE  Final   Culture (A)  Final    <10,000 COLONIES/mL INSIGNIFICANT GROWTH Performed at [facility], [address] [place], [place] [address]    Report Status 12/13/2016 FINAL  Final  Culture, blood (Routine X 2) w Reflex to ID Panel     Status: None (Preliminary result)   Collection Time: 12/12/16  9:06 AM  Result Value Ref Range Status   Specimen Description BLOOD BLOOD LEFT ARM  Final   Special Requests   Final    BOTTLES DRAWN AEROBIC AND ANAEROBIC Blood Culture results may not be optimal due to an excessive volume of blood received in culture bottles   Culture   Final    NO GROWTH 1 DAY Performed at [facility], [place] [address], [place], [place] [address]    Report Status PENDING  Incomplete  Culture, blood (Routine X 2) w Reflex to ID Panel     Status: None (Preliminary result)   Collection Time: 12/12/16  9:15 AM  Result Value Ref Range Status   Specimen  Description BLOOD RIGHT WRIST  Final   Special Requests IN PEDIATRIC BOTTLE Blood Culture adequate volume  Final   Culture   Final    NO GROWTH 1 DAY Performed at [facility], [place] [address], [place], [place] [address]    Report Status PENDING  Incomplete     Radiology Studies: No results found.  Scheduled Meds: . carvedilol  37.5 mg Oral BID WC  . colchicine  0.3 mg Oral Daily  . diltiazem  420 mg Oral Daily  . doxycycline  100 mg Oral Q12H  . gabapentin  300 mg Oral TID  . insulin aspart  0-9 Units Subcutaneous TID WC  . insulin aspart  6 Units Subcutaneous TID WC  . insulin glargine  30 Units Subcutaneous QHS  . metolazone  5 mg Oral Daily  . potassium chloride  20 mEq Oral BID  . rivaroxaban  15 mg Oral Q supper  . sodium chloride flush  3 mL Intravenous Q12H   Continuous Infusions: . sodium chloride    . furosemide Stopped (12/13/16 1528)  . sodium chloride    . sodium chloride       LOS: 11 days   CHIU, Orpah Melter, MD [facility] Pager [phone]  If 7PM-7AM, please contact night-coverage [email or web address] Password [email or web address] 12/13/2016, 5:23 PM

## 2016-12-13 NOTE — Progress Notes (Signed)
Telemetry reviewed, remains in afib rates 90s-110s. Yesterday we increased his dilt to 420. Given patients weight higher doses of coreg are acceptable (135 kg), we will increase coreg to 37.5mg  bid. Likely will need consideration for cardioversion once euvolemic, av nodal agents will likely need to be scaled back around the time of cardioversion. Regarding patients HF we have deferred diuretic dosing to nephrology.     Carlyle Dolly MD

## 2016-12-14 DIAGNOSIS — I480 Paroxysmal atrial fibrillation: Secondary | ICD-10-CM

## 2016-12-14 LAB — GLUCOSE, CAPILLARY
GLUCOSE-CAPILLARY: 88 mg/dL (ref 65–99)
GLUCOSE-CAPILLARY: 90 mg/dL (ref 65–99)
GLUCOSE-CAPILLARY: 113 mg/dL — AB (ref 65–99)
GLUCOSE-CAPILLARY: 109 mg/dL — AB (ref 65–99)
Glucose-Capillary: 142 mg/dL — ABNORMAL HIGH (ref 65–99)

## 2016-12-14 LAB — RENAL FUNCTION PANEL
Albumin: 2.4 g/dL — ABNORMAL LOW (ref 3.5–5.0)
Anion gap: 15 (ref 5–15)
BUN: 94 mg/dL — ABNORMAL HIGH (ref 6–20)
CO2: 36 mmol/L — ABNORMAL HIGH (ref 22–32)
Calcium: 9.1 mg/dL (ref 8.9–10.3)
Chloride: 85 mmol/L — ABNORMAL LOW (ref 101–111)
Creatinine, Ser: 3.16 mg/dL — ABNORMAL HIGH (ref 0.61–1.24)
GFR calc non Af Amer: 22 mL/min — ABNORMAL LOW (ref >60)
GFR, EST AFRICAN AMERICAN: 26 mL/min — AB (ref >60)
GLUCOSE: 96 mg/dL (ref 65–99)
Phosphorus: 5 mg/dL — ABNORMAL HIGH (ref 2.5–4.6)
Potassium: 3.9 mmol/L (ref 3.5–5.1)
SODIUM: 136 mmol/L (ref 135–145)

## 2016-12-14 MED ORDER — FUROSEMIDE 40 MG PO TABS
160.0000 mg | ORAL_TABLET | Freq: Two times a day (BID) | ORAL | Status: DC
  Administered 2016-12-14 – 2016-12-16 (×4): 160 mg via ORAL
  Filled 2016-12-14 (×4): qty 4

## 2016-12-14 MED ORDER — ALUM & MAG HYDROXIDE-SIMETH 200-200-20 MG/5ML PO SUSP
30.0000 mL | ORAL | Status: DC | PRN
  Administered 2016-12-14: 30 mL via ORAL
  Filled 2016-12-14: qty 30

## 2016-12-14 MED ORDER — INSULIN GLARGINE 100 UNIT/ML ~~LOC~~ SOLN
28.0000 [IU] | Freq: Every day | SUBCUTANEOUS | Status: DC
  Administered 2016-12-14 – 2016-12-15 (×2): 28 [IU] via SUBCUTANEOUS
  Filled 2016-12-14 (×3): qty 0.28

## 2016-12-14 NOTE — Progress Notes (Signed)
Progress Note  Patient Name: John Bailey Date of Encounter: 12/14/2016  Primary Cardiologist: [facility]  Subjective   Feeling better. Less dyspnea and hoarseness. Markedly improved rate control, almost in desired range. 34 lb down from peak weight, -15L net diuresis.  Inpatient Medications    Scheduled Meds: . carvedilol  37.5 mg Oral BID WC  . colchicine  0.3 mg Oral Daily  . diltiazem  420 mg Oral Daily  . doxycycline  100 mg Oral Q12H  . gabapentin  300 mg Oral TID  . insulin aspart  0-9 Units Subcutaneous TID WC  . insulin aspart  6 Units Subcutaneous TID WC  . insulin glargine  28 Units Subcutaneous QHS  . metolazone  5 mg Oral Daily  . potassium chloride  20 mEq Oral BID  . rivaroxaban  15 mg Oral Q supper  . sodium chloride flush  3 mL Intravenous Q12H   Continuous Infusions: . sodium chloride    . furosemide Stopped (12/14/16 1355)  . sodium chloride    . sodium chloride     PRN Meds: sodium chloride, acetaminophen **OR** acetaminophen, albuterol, benzonatate, levalbuterol, lip balm, menthol-cetylpyridinium, ondansetron **OR** ondansetron (ZOFRAN) IV, oxyCODONE, sodium chloride flush   Vital Signs    Vitals:   12/13/16 0455 12/13/16 1415 12/13/16 2200 12/14/16 0400  BP: 132/84 125/84 119/80 (!) 119/96  Pulse: (!) 106 96 (!) 102 (!) 111  Resp: 18 18 18 18   Temp: 99.1 F (37.3 C) 98.5 F (36.9 C) 97.9 F (36.6 C) 99.2 F (37.3 C)  TempSrc: Oral Oral Oral Oral  SpO2: 96% 98% 93% 96%  Weight: 298 lb 3.2 oz (135.3 kg)   292 lb 5.3 oz (132.6 kg)  Height:        Intake/Output Summary (Last 24 hours) at 12/14/16 1434 Last data filed at 12/14/16 0925  Gross per 24 hour  Intake              360 ml  Output             2100 ml  Net            -1740 ml   Filed Weights   12/12/16 0422 12/13/16 0455 12/14/16 0400  Weight: (!) 302 lb 1.6 oz (137 kg) 298 lb 3.2 oz (135.3 kg) 292 lb 5.3 oz (132.6 kg)    Telemetry    AFib, VR around 100 - Personally  Reviewed  ECG    Afib w RVR, RBBB - Personally Reviewed  Physical Exam  Morbid obesity limits exam GEN: No acute distress.   Neck: difficult to see JVD Cardiac: RRR, no murmurs, rubs, or gallops.  Respiratory: Clear to auscultation bilaterally. GI: Soft, nontender, non-distended  MS: 1-2+ ankle edema R>L, lots of wrinkles; No deformity. Neuro:  Nonfocal  Psych: Normal affect   Labs    Chemistry Recent Labs Lab 12/12/16 0446 12/13/16 0625 12/14/16 0528  NA 132* 133* 136  K 4.0 3.8 3.9  CL 84* 83* 85*  CO2 31 35* 36*  GLUCOSE 125* 77 96  BUN 94* 92* 94*  CREATININE 3.12* 3.17* 3.16*  CALCIUM 8.7* 8.8* 9.1  ALBUMIN 2.2* 2.3* 2.4*  GFRNONAA 23* 22* 22*  GFRAA 26* 26* 26*  ANIONGAP 17* 15 15     Hematology Recent Labs Lab 12/08/16 0554 12/10/16 0533  WBC 16.9* 12.5*  RBC 4.92 4.36  HGB 12.7* 11.3*  HCT 39.7 35.1*  MCV 80.7 80.5  MCH 25.8* 25.9*  MCHC 32.0 32.2  RDW 16.4* 16.3*  PLT 380 413*    Cardiac EnzymesNo results for input(s): TROPONINI in the last 168 hours. No results for input(s): TROPIPOC in the last 168 hours.   BNPNo results for input(s): BNP, PROBNP in the last 168 hours.   DDimer No results for input(s): DDIMER in the last 168 hours.   Radiology    No results found.  Cardiac Studies   ECHO 06/25/2016 - Left ventricle: The cavity size is small. 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 Systolic function was normal. The   estimated ejection fraction was in the range of 50% to 55%.   Distal anterior/apical hypokinesis seen on Definity contrast   images. Doppler parameters are consistent with restrictive   physiology (grade 3 diastolic dysfunction). The E/A ratio is   >2.5. The E/e&' ratio is >20, suggesting markedly elevated LV   filling pressure. - Mitral valve: Mitral B-notch noted, suggesting elevated LA   pressure. - Left atrium: Moderately dilated. -  Right ventricle: The cavity size was mildly dilated. Systolic   function was normal. - Right atrium: Severely dilated. - Tricuspid valve: There was trivial regurgitation. - Pulmonary arteries: PA peak pressure: 19 mm Hg (S). - Inferior vena cava: The vessel was normal in size. The   respirophasic diameter changes were in the normal range (>= 50%),   consistent with normal central venous pressure. - Pericardium, extracardiac: Small pericardial effusion. Features   were not consistent with tamponade physiology.  Impressions:  - Compared to a prior study in 2017, the LVEF has increased to   50-55% with distal anterior/apical hypokinesis as seen on   Definity contrast images.  Patient Profile     46 y.o. male with morbid obesity, DM, HTN, persistent atrial fibrillation with severe hypervolemia/acute on chronic diastolic heart failure, improving after diuresis and increased rate control meds. Based on last years trend, dry weight seems to be 280-288 lb.  Assessment & Plan    1. CHF: continue diuretics per Nephrology; not yet euvolemic, but within 10 of it. 2. AFib: rate control is much improved today, no plan for additional titration of rate control meds today - expect rate will continue to slowly improve today. On anticoagulant. Borderline for continued Xarelto, but not sure of his compliance with warfarin. Will tentatively plan for DCCV with Anesthesiology support on Friday Oct 5 (first availability this week). 3. CKD: creatinine unchanged last 4 days despite intense diuresis. 4. HTN: well controlled. 5. Morbid obesity underlies his metabolic an cardiac problems.  For questions or updates, please contact [place] Please consult [email or web address] for contact info under Cardiology/STEMI.      Signed, Sanda Klein, MD  12/14/2016, 2:34 PM

## 2016-12-14 NOTE — Progress Notes (Signed)
Nutrition Brief Note  Patient identified as extended length of stay. Appetite and wt stable.   Wt Readings from Last 15 Encounters:  12/14/16 292 lb 5.3 oz (132.6 kg)  07/31/16 (!) 312 lb 12.8 oz (141.9 kg)  03/20/16 291 lb 6.4 oz (132.2 kg)  03/18/16 294 lb (133.4 kg)  11/11/15 283 lb 3.2 oz (128.5 kg)  09/05/15 288 lb (130.6 kg)  08/14/15 280 lb (127 kg)  08/08/15 (!) 301 lb 6.4 oz (136.7 kg)  05/30/14 297 lb 12.8 oz (135.1 kg)  07/14/12 297 lb (134.7 kg)    Body mass index is 40.77 kg/m. Patient meets criteria for morbid obesity based on current BMI.   Current diet order is heart healthy/carb modified, patient is consuming approximately 88% of meals at this time. Labs and medications reviewed.   No nutrition interventions warranted at this time. If nutrition issues arise, please consult RD.   Mariana Single RD, LDN Clinical Nutrition Pager # [phone]

## 2016-12-14 NOTE — Progress Notes (Signed)
PROGRESS NOTE    John Bailey  WUJ:[id number] DOB: 06-Apr-1970 DOA: 12/02/2016 PCP: [name], MD    Brief Narrative:  46 y.o.malewith a hx of diabetes, hypertension, atrial fibrillation on Xarelto, chronic systolic and diastolic CHF,  CKD4 admitted with severe vol overload, AKI and Afib RVR. 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 -Continue to encourage ambulation, OOB -Nephrology to consider dialysis options, plans to review vascular axis placement options as an outpatient given his progressive CK D. -Slowly improving. LE edema improved on exam    AKi on CKD4 -baseline creatinine around 2.5 -due to DM and hypertensive nephropathy and now with cardiorenal syndrome -Patient is continued on high dose IV lasix per Renal -Appreciate input by nephrology -Continue to monitor I's and O's and daily weights. -Stable. Repeat bmet in AM  Secondary LLE cellulitis -improving, continue on doxycycline as tolerated -monitor -Remains afebrile -Currently on oral doxycycline, tolerating. Currently stable    Paroxysmal atrial fibrillation (HCC) -with RVR on cardizem gtt -chadsvasc score is 5, on xarelto, may need to be switched to coumadin  if creatinine worsens -on PO cardizem  -Cardiology considering TEE with cardioversion when volume status improved -HR higher today to the 150's on ambulating. Cardiology following   HTN -BP currently stable at this time   Insulin-dependent diabetes mellitus with renal complications ([place]) -on lantus at home, now SSI  CBGS better now on increased lantus and meal coverage -Presently stable  DVT prophylaxis: Xarelto Code Status: Full Family Communication: Pt in room,family not at bedside Disposition Plan: Uncertain at this time  Consultants:   Cardiology  Nephrology  Procedures:     Antimicrobials: Anti-infectives    Start     Dose/Rate Route Frequency Ordered Stop   12/11/16 2200  doxycycline (VIBRA-TABS) tablet 100 mg     100 mg Oral Every 12 hours 12/11/16 1055     12/05/16 0800  doxycycline (VIBRAMYCIN) 100 mg in dextrose 5 % 250 mL IVPB  Status:  Discontinued     100 mg 125 mL/hr over 120 Minutes Intravenous Every 12 hours 12/05/16 0755 12/11/16 1055   12/02/16 2000  piperacillin-tazobactam (ZOSYN) IVPB 3.375 g  Status:  Discontinued     3.375 g 12.5 mL/hr over 240 Minutes Intravenous Every 8 hours 12/02/16 1724 12/03/16 0756   12/02/16 1800  clindamycin (CLEOCIN) IVPB 900 mg  Status:  Discontinued     900 mg 100 mL/hr over 30 Minutes Intravenous Every 8 hours 12/02/16 1707 12/03/16 0756   12/02/16 1430  piperacillin-tazobactam (ZOSYN) IVPB 3.375 g     3.375 g 100 mL/hr over 30 Minutes Intravenous  Once 12/02/16 1424 12/02/16 1517   12/02/16 1430  vancomycin (VANCOCIN) IVPB 1000 mg/200 mL premix     1,000 mg 200 mL/hr over  60 Minutes Intravenous  Once 12/02/16 1424 12/02/16 1704      Subjective: Without complaints  Objective: Vitals:   12/13/16 0455 12/13/16 1415 12/13/16 2200 12/14/16 0400  BP: 132/84 125/84 119/80 (!) 119/96  Pulse: (!) 106 96 (!) 102 (!) 111  Resp: 18 18 18 18   Temp: 99.1 F (37.3 C) 98.5 F (36.9 C) 97.9 F (36.6 C) 99.2 F (37.3  C)  TempSrc: Oral Oral Oral Oral  SpO2: 96% 98% 93% 96%  Weight: 135.3 kg (298 lb 3.2 oz)   132.6 kg (292 lb 5.3 oz)  Height:        Intake/Output Summary (Last 24 hours) at 12/14/16 1338 Last data filed at 12/14/16 0539  Gross per 24 hour  Intake              360 ml  Output             2100 ml  Net            -1740 ml   Filed Weights   12/12/16 0422 12/13/16 0455 12/14/16 0400  Weight: (!) 137 kg (302 lb 1.6 oz) 135.3 kg (298 lb 3.2 oz) 132.6 kg (292 lb 5.3 oz)    Examination: General exam: Awake, laying in bed, in nad Respiratory system: Normal respiratory effort, no wheezing Cardiovascular system: regular rate, s1, s2 Gastrointestinal system: Soft, nondistended, positive BS Central nervous system: CN2-12 grossly intact, strength intact Extremities: Perfused, no clubbing Skin: Normal skin turgor, no notable skin lesions seen Psychiatry: Mood normal // no visual hallucinations   Data Reviewed: I have personally reviewed following labs and imaging studies  CBC:  Recent Labs Lab 12/08/16 0554 12/10/16 0533  WBC 16.9* 12.5*  HGB 12.7* 11.3*  HCT 39.7 35.1*  MCV 80.7 80.5  PLT 380 767*   Basic Metabolic Panel:  Recent Labs Lab 12/10/16 0533 12/11/16 0441 12/12/16 0446 12/13/16 0625 12/14/16 0528  NA 130* 129* 132* 133* 136  K 3.4* 3.4* 4.0 3.8 3.9  CL 83* 82* 84* 83* 85*  CO2 32 33* 31 35* 36*  GLUCOSE 119* 142* 125* 77 96  BUN 92* 96* 94* 92* 94*  CREATININE 2.84* 3.14* 3.12* 3.17* 3.16*  CALCIUM 8.3* 8.4* 8.7* 8.8* 9.1  MG  --  1.4* 1.8  --   --   PHOS 5.1* 5.5* 4.9* 5.4* 5.0*   GFR: Estimated Creatinine Clearance: 41.4 mL/min (A) (by C-G formula based on SCr of 3.16 mg/dL (H)). Liver Function Tests:  Recent Labs Lab 12/10/16 0533 12/11/16 0441 12/12/16 0446 12/13/16 0625 12/14/16 0528  ALBUMIN 2.2* 2.2* 2.2* 2.3* 2.4*   No results for input(s): LIPASE, AMYLASE in the last 168 hours. No results for input(s): AMMONIA in the last 168  hours. Coagulation Profile: No results for input(s): INR, PROTIME in the last 168 hours. Cardiac Enzymes: No results for input(s): CKTOTAL, CKMB, CKMBINDEX, TROPONINI in the last 168 hours. BNP (last 3 results) No results for input(s): PROBNP in the last 8760 hours. HbA1C: No results for input(s): HGBA1C in the last 72 hours. CBG:  Recent Labs Lab 12/13/16 1139 12/13/16 1730 12/13/16 2239 12/14/16 0751 12/14/16 1134  GLUCAP 178* 97 88 90 113*   Lipid Profile: No results for input(s): CHOL, HDL, LDLCALC, TRIG, CHOLHDL, LDLDIRECT in the last 72 hours. Thyroid Function Tests: No results for input(s): TSH, T4TOTAL, FREET4, T3FREE, THYROIDAB in the last 72 hours. Anemia Panel: No results for input(s): VITAMINB12, FOLATE, FERRITIN, TIBC, IRON, RETICCTPCT in the last 72 hours.  Sepsis Labs: No results for input(s): PROCALCITON, LATICACIDVEN in the last 168 hours.  Recent Results (from the past 240 hour(s))  Urine Culture     Status: Abnormal   Collection Time: 12/12/16  8:51 AM  Result Value Ref Range Status   Specimen Description URINE, CLEAN CATCH  Final   Special Requests NONE  Final   Culture (A)  Final    <10,000 COLONIES/mL INSIGNIFICANT GROWTH Performed at [facility], [address], [place], [place] [address]    Report Status 12/13/2016 FINAL  Final  Culture, blood (Routine X 2) w Reflex to ID Panel     Status: None (Preliminary result)   Collection Time: 12/12/16  9:06 AM  Result Value Ref Range Status   Specimen Description BLOOD BLOOD LEFT ARM  Final   Special Requests   Final    BOTTLES DRAWN AEROBIC AND ANAEROBIC Blood Culture results may not be optimal due to an excessive volume of blood received in culture bottles   Culture   Final    NO GROWTH 2 DAYS Performed at [facility], [place] [address] [place], [place] [address]    Report Status PENDING  Incomplete  Culture, blood (Routine X 2) w Reflex to ID Panel     Status: None (Preliminary result)    Collection Time: 12/12/16  9:15 AM  Result Value Ref Range Status   Specimen Description BLOOD RIGHT WRIST  Final   Special Requests IN PEDIATRIC BOTTLE Blood Culture adequate volume  Final   Culture   Final    NO GROWTH 2 DAYS Performed at [facility], [place] [address], [place], [place] [address]    Report Status PENDING  Incomplete     Radiology Studies: No results found.  Scheduled Meds: . carvedilol  37.5 mg Oral BID WC  . colchicine  0.3 mg Oral Daily  . diltiazem  420 mg Oral Daily  . doxycycline  100 mg Oral Q12H  . gabapentin  300 mg Oral TID  . insulin aspart  0-9 Units Subcutaneous TID WC  . insulin aspart  6 Units Subcutaneous TID WC  . insulin glargine  28 Units Subcutaneous QHS  . metolazone  5 mg Oral Daily  . potassium chloride  20 mEq Oral BID  . rivaroxaban  15 mg Oral Q supper  . sodium chloride flush  3 mL Intravenous Q12H   Continuous Infusions: . sodium chloride    . furosemide 160 mg (12/14/16 1255)  . sodium chloride    . sodium chloride       LOS: 12 days   CHIU, Orpah Melter, MD [facility] Pager [phone]  If 7PM-7AM, please contact night-coverage [email or web address] Password [email or web address] 12/14/2016, 1:38 PM

## 2016-12-14 NOTE — Progress Notes (Signed)
Occupational Therapy Treatment Patient Details Name: YACINE GARRIGA MRN: [id number] DOB: May 11, 1970 Today's Date: 12/14/2016    History of present illness 46 yo male admitted with A fib, HF.   OT comments  AE instruction  Follow Up Recommendations  No OT follow up;Supervision/Assistance - 24 hour    Equipment Recommendations  3 in 1 bedside commode (bariatric BSC )    Recommendations for Other Services      Precautions / Restrictions Restrictions Weight Bearing Restrictions: No       Mobility Bed Mobility Overal bed mobility: Modified Independent                Transfers                 General transfer comment: did not perform    Balance                                           ADL either performed or assessed with clinical judgement   ADL Overall ADL's : Needs assistance/impaired                                       General ADL Comments: AE instuction provided.  Pt states these will be beneficial . Wife will obtain and OT will switch out large for small sock aid as pt will need large.  Pt will need further practice               Cognition Arousal/Alertness: Awake/alert Behavior During Therapy: WFL for tasks assessed/performed Overall Cognitive Status: Within Functional Limits for tasks assessed                                                General Comments      Pertinent Vitals/ Pain       Faces Pain Scale: Hurts little more Pain Location: LLE  Pain Descriptors / Indicators: Aching;Grimacing;Discomfort Pain Intervention(s): Monitored during session;Premedicated before session         Frequency  Min 2X/week        Progress Toward Goals  OT Goals(current goals can now be found in the care plan section)  Progress towards OT goals: Progressing toward goals     Plan Discharge plan remains appropriate    Co-evaluation                 AM-PAC PT "6 Clicks" Daily  Activity     Outcome Measure   Help from another person eating meals?: None Help from another person taking care of personal grooming?: A Little Help from another person toileting, which includes using toliet, bedpan, or urinal?: A Little Help from another person bathing (including washing, rinsing, drying)?: A Little Help from another person to put on and taking off regular upper body clothing?: None Help from another person to put on and taking off regular lower body clothing?: A Lot 6 Click Score: 19    End of Session    OT Visit Diagnosis: Unsteadiness on feet (R26.81);Other abnormalities of gait and mobility (R26.89);Pain Pain - Right/Left: Left Pain - part of body: Ankle and joints of foot   Activity Tolerance Patient limited by  lethargy   Patient Left with call bell/phone within reach;in bed   Nurse Communication Mobility status        Time: 6887-3730 OT Time Calculation (min): 26 min  Charges: OT General Charges $OT Visit: 1 Visit OT Treatments $Self Care/Home Management : 23-37 mins  [place], [place]   Payton Mccallum D 12/14/2016, 2:21 PM

## 2016-12-14 NOTE — Progress Notes (Signed)
Patient ID: John Bailey, male   DOB: 1970-11-04, 46 y.o.   MRN: [id number] S: No new complaints, R leg much better edema, L leg still hurts.  No SOB.   O:BP (!) 119/96 (BP Location: Right Arm)   Pulse (!) 111   Temp 99.2 F (37.3 C) (Oral)   Resp 18   Ht 5\' 11"  (1.803 m)   Wt 132.6 kg (292 lb 5.3 oz)   SpO2 96%   BMI 40.77 kg/m   Intake/Output Summary (Last 24 hours) at 12/14/16 1505 Last data filed at 12/14/16 0925  Gross per 24 hour  Intake              360 ml  Output             2100 ml  Net            -1740 ml   Intake/Output: I/O last 3 completed shifts: In: 240 [P.O.:240] Out: 4000 [Urine:4000]  Intake/Output this shift:  Total I/O In: 360 [P.O.:360] Out: 700 [Urine:700] Weight change: -2.663 kg (-5 lb 13.9 oz) Gen:NAD CVS: IRR IRR Resp:cta ZSW:FUXNAT Ext:1+ edema on right lower ext, 2+ on left  Assessment: 1. AKI/CKD stage 4 in setting of decompensated CHF. Creat stable, sp diuresis of 20- 30 lbs.  He may need to get AV fistula in OP setting.   2. Volume overload/anasarca- much better in LE's.  3. NICM- EF 35% 4. A fib with RVR- per cardiology on dilt, on xarelto 15mg  daily 5. DM type 2 on insulin 6. Obesity 7. HTN- stable  Rec - will switch over to po lasix 160 bid and dc zaroxolyn.  He wasn't taking his meds prior to admission. Has had a good diuresis, says he knows now he has to take his medication and watch what he eats/ drinks.  He will make f/u appt with Dr. Moshe Cipro when he is dc'd.  Will sign off.    Kelly Splinter MD [employer] pgr [phone]   12/14/2016, 3:07 PM       Recent Labs Lab 12/08/16 2706 12/10/16 0533 12/11/16 0441 12/12/16 0446 12/13/16 0625 12/14/16 0528  NA 130* 130* 129* 132* 133* 136  K 3.5 3.4* 3.4* 4.0 3.8 3.9  CL 84* 83* 82* 84* 83* 85*  CO2 32 32 33* 31 35* 36*  GLUCOSE 209* 119* 142* 125* 77 96  BUN 99* 92* 96* 94* 92* 94*  CREATININE 2.79* 2.84* 3.14* 3.12* 3.17* 3.16*  ALBUMIN  --   2.2* 2.2* 2.2* 2.3* 2.4*  CALCIUM 8.5* 8.3* 8.4* 8.7* 8.8* 9.1  PHOS  --  5.1* 5.5* 4.9* 5.4* 5.0*   Liver Function Tests:  Recent Labs Lab 12/12/16 0446 12/13/16 0625 12/14/16 0528  ALBUMIN 2.2* 2.3* 2.4*   No results for input(s): LIPASE, AMYLASE in the last 168 hours. No results for input(s): AMMONIA in the last 168 hours. CBC:  Recent Labs Lab 12/08/16 0554 12/10/16 0533  WBC 16.9* 12.5*  HGB 12.7* 11.3*  HCT 39.7 35.1*  MCV 80.7 80.5  PLT 380 413*   Cardiac Enzymes: No results for input(s): CKTOTAL, CKMB, CKMBINDEX, TROPONINI in the last 168 hours. CBG:  Recent Labs Lab 12/13/16 1139 12/13/16 1730 12/13/16 2239 12/14/16 0751 12/14/16 1134  GLUCAP 178* 97 88 90 113*    Iron Studies: No results for input(s): IRON, TIBC, TRANSFERRIN, FERRITIN in the last 72 hours. Studies/Results: No results found. . carvedilol  37.5 mg Oral BID WC  . colchicine  0.3  mg Oral Daily  . diltiazem  420 mg Oral Daily  . doxycycline  100 mg Oral Q12H  . gabapentin  300 mg Oral TID  . insulin aspart  0-9 Units Subcutaneous TID WC  . insulin aspart  6 Units Subcutaneous TID WC  . insulin glargine  28 Units Subcutaneous QHS  . metolazone  5 mg Oral Daily  . potassium chloride  20 mEq Oral BID  . rivaroxaban  15 mg Oral Q supper  . sodium chloride flush  3 mL Intravenous Q12H    BMET    Component Value Date/Time   NA 136 12/14/2016 0528   K 3.9 12/14/2016 0528   CL 85 (L) 12/14/2016 0528   CO2 36 (H) 12/14/2016 0528   GLUCOSE 96 12/14/2016 0528   BUN 94 (H) 12/14/2016 0528   CREATININE 3.16 (H) 12/14/2016 0528   CALCIUM 9.1 12/14/2016 0528   GFRNONAA 22 (L) 12/14/2016 0528   GFRAA 26 (L) 12/14/2016 0528   CBC    Component Value Date/Time   WBC 12.5 (H) 12/10/2016 0533   RBC 4.36 12/10/2016 0533   HGB 11.3 (L) 12/10/2016 0533   HCT 35.1 (L) 12/10/2016 0533   PLT 413 (H) 12/10/2016 0533   MCV 80.5 12/10/2016 0533   MCH 25.9 (L) 12/10/2016 0533   MCHC 32.2  12/10/2016 0533   RDW 16.3 (H) 12/10/2016 0533   LYMPHSABS 2.2 12/02/2016 1405   MONOABS 1.1 (H) 12/02/2016 1405   EOSABS 0.0 12/02/2016 1405   BASOSABS 0.0 12/02/2016 1405

## 2016-12-14 NOTE — Progress Notes (Signed)
PT Cancellation Note  Patient Details Name: HOLGER SOKOLOWSKI MRN: [id number] DOB: Apr 16, 1970   Cancelled Treatment:     pt just got back to bed from bathroom and took pain meds stating his "legs are hurting too much right now:.  Pt has been evaluated by LPT with no post acute hospital PT rec.    Nathanial Rancher 12/14/2016, 10:31 AM

## 2016-12-15 LAB — BASIC METABOLIC PANEL
Anion gap: 15 (ref 5–15)
BUN: 90 mg/dL — AB (ref 6–20)
CHLORIDE: 84 mmol/L — AB (ref 101–111)
CO2: 37 mmol/L — ABNORMAL HIGH (ref 22–32)
CREATININE: 3.1 mg/dL — AB (ref 0.61–1.24)
Calcium: 8.8 mg/dL — ABNORMAL LOW (ref 8.9–10.3)
GFR calc Af Amer: 27 mL/min — ABNORMAL LOW (ref >60)
GFR calc non Af Amer: 23 mL/min — ABNORMAL LOW (ref >60)
Glucose, Bld: 144 mg/dL — ABNORMAL HIGH (ref 65–99)
POTASSIUM: 4.2 mmol/L (ref 3.5–5.1)
Sodium: 136 mmol/L (ref 135–145)

## 2016-12-15 LAB — GLUCOSE, CAPILLARY
GLUCOSE-CAPILLARY: 112 mg/dL — AB (ref 65–99)
GLUCOSE-CAPILLARY: 138 mg/dL — AB (ref 65–99)
Glucose-Capillary: 104 mg/dL — ABNORMAL HIGH (ref 65–99)
Glucose-Capillary: 191 mg/dL — ABNORMAL HIGH (ref 65–99)

## 2016-12-15 NOTE — Progress Notes (Signed)
Progress Note  Patient Name: John Bailey Date of Encounter: 12/15/2016  Primary Cardiologist: Dr. Debara Pickett  Subjective   Breathing and lower extremity edema has improved. Overall -16.2L net output. He ambulated to the nurses station earlier this morning.   Inpatient Medications    Scheduled Meds: . carvedilol  37.5 mg Oral BID WC  . colchicine  0.3 mg Oral Daily  . diltiazem  420 mg Oral Daily  . furosemide  160 mg Oral BID  . gabapentin  300 mg Oral TID  . insulin aspart  0-9 Units Subcutaneous TID WC  . insulin aspart  6 Units Subcutaneous TID WC  . insulin glargine  28 Units Subcutaneous QHS  . potassium chloride  20 mEq Oral BID  . rivaroxaban  15 mg Oral Q supper  . sodium chloride flush  3 mL Intravenous Q12H   Continuous Infusions: . sodium chloride    . sodium chloride    . sodium chloride     PRN Meds: sodium chloride, acetaminophen **OR** acetaminophen, albuterol, alum & mag hydroxide-simeth, benzonatate, levalbuterol, lip balm, menthol-cetylpyridinium, ondansetron **OR** ondansetron (ZOFRAN) IV, oxyCODONE, sodium chloride flush   Vital Signs    Vitals:   12/14/16 1517 12/14/16 2006 12/15/16 0452 12/15/16 0454  BP: 104/66 (!) 105/55 (!) 129/96   Pulse: 81 87 (!) 112   Resp: 20 18 18    Temp: 98.4 F (36.9 C) 98.6 F (37 C) 99.1 F (37.3 C)   TempSrc: Oral Oral Oral   SpO2: 95% 97% 91%   Weight:    291 lb 7.2 oz (132.2 kg)  Height:        Intake/Output Summary (Last 24 hours) at 12/15/16 1219 Last data filed at 12/15/16 0454  Gross per 24 hour  Intake              480 ml  Output             1300 ml  Net             -820 ml   Filed Weights   12/13/16 0455 12/14/16 0400 12/15/16 0454  Weight: 298 lb 3.2 oz (135.3 kg) 292 lb 5.3 oz (132.6 kg) 291 lb 7.2 oz (132.2 kg)    Telemetry    Atrial fibrillation, HR in low-100's to 110's.  - Personally Reviewed  ECG    No new tracings.   Physical Exam   General: Well developed, obese African  American male appearing in no acute distress. Head: Normocephalic, atraumatic.  Neck: Supple without bruits, JVD difficult to assess secondary to body habitus. Lungs:  Resp regular and unlabored, CTA without wheezing or rales. Heart: Irregularly irregular, S1, S2, no S3, S4, or murmur; no rub. Abdomen: Soft, non-tender, non-distended with normoactive bowel sounds. No hepatomegaly. No rebound/guarding. No obvious abdominal masses. Extremities: No clubbing or cyanosis, 1+ pitting edema bilaterally. Distal pedal pulses are 2+ bilaterally. Neuro: Alert and oriented X 3. Moves all extremities spontaneously. Psych: Normal affect.  Labs    Chemistry  Recent Labs Lab 12/12/16 0446 12/13/16 0625 12/14/16 0528  NA 132* 133* 136  K 4.0 3.8 3.9  CL 84* 83* 85*  CO2 31 35* 36*  GLUCOSE 125* 77 96  BUN 94* 92* 94*  CREATININE 3.12* 3.17* 3.16*  CALCIUM 8.7* 8.8* 9.1  ALBUMIN 2.2* 2.3* 2.4*  GFRNONAA 23* 22* 22*  GFRAA 26* 26* 26*  ANIONGAP 17* 15 15     Hematology  Recent Labs Lab 12/10/16 0533  WBC  12.5*  RBC 4.36  HGB 11.3*  HCT 35.1*  MCV 80.5  MCH 25.9*  MCHC 32.2  RDW 16.3*  PLT 413*    Cardiac EnzymesNo results for input(s): TROPONINI in the last 168 hours. No results for input(s): TROPIPOC in the last 168 hours.   BNPNo results for input(s): BNP, PROBNP in the last 168 hours.   DDimer No results for input(s): DDIMER in the last 168 hours.   Radiology    No results found.  Cardiac Studies   Echocardiogram: 06/2016 Study Conclusions  - Left ventricle: The cavity size is small. 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 Systolic function was normal. The   estimated ejection fraction was in the range of 50% to 55%.   Distal anterior/apical hypokinesis seen on Definity contrast   images. Doppler parameters are consistent with restrictive   physiology (grade 3 diastolic dysfunction). The  E/A ratio is   >2.5. The E/e&' ratio is >20, suggesting markedly elevated LV   filling pressure. - Mitral valve: Mitral B-notch noted, suggesting elevated LA   pressure. - Left atrium: Moderately dilated. - Right ventricle: The cavity size was mildly dilated. Systolic   function was normal. - Right atrium: Severely dilated. - Tricuspid valve: There was trivial regurgitation. - Pulmonary arteries: PA peak pressure: 19 mm Hg (S). - Inferior vena cava: The vessel was normal in size. The   respirophasic diameter changes were in the normal range (>= 50%),   consistent with normal central venous pressure. - Pericardium, extracardiac: Small pericardial effusion. Features   were not consistent with tamponade physiology.  Impressions:  - Compared to a prior study in 2017, the LVEF has increased to   50-55% with distal anterior/apical hypokinesis as seen on   Definity contrast images.   Patient Profile     46 y.o. male w/ PMH of morbid obesity, Type 2 DM, HTN, persistent atrial fibrillation (on Xarelto), and chronic diastolic CHF admitted with severe hypervolemia/acute on chronic diastolic heart failure. Has been improving after diuresis and increased rate control meds. Dry weight of 280-288 lbs.  Assessment & Plan    1. Acute on Chronic Diastolic CHF - has been diuresed with IV Lasix with a net output of -16.2L and weight down from 320 lbs to 291 lbs (dry weight between 280 - 288 lbs). Has been switched from IV Lasix to PO Lasix 160mg  BID by Nephrology.   2. Persistent Atrial Fibrillation  - rate control is much improved today but HR still remains in the low-100's to 110's. Currently on Coreg 37.5mg  BID and Cardizem CD 420mg  daily.  - he reports good compliance with Xarelto. No evidence of active bleeding.  - plan is for DCCV on Friday (first available). If he is medically stable for discharge prior to this, it can be performed as an outpatient on that day.   3. Acute on Chronic  Stage 4 CKD - creatinine remains stable at 3.16. Repeat BMET in AM.  - Nephrology following.   4. HTN - BP well-controlled at 104/55 - 129/96 within the past 24 hours.  - continue current medication regimen.   5. Morbid obesity - underlies his metabolic and cardiac problems.  Signed, Erma Heritage , PA-C 12:19 PM 12/15/2016 Pager: [phone]  I have seen and examined the patient along with Erma Heritage , PA-C.  I have reviewed the chart, notes and new data.  I agree with  PA's note.  Key new complaints: walked in hallway, felt a little wobbly, but not dyspneic Key examination changes: rate control is still suboptimal Key new findings / data: stable creatinine, switched to PO diuretics today.  PLAN: Reluctant to increase carvedilol or diltiazem any further. Plan for DCCV on Friday - may have sinus bradycardia after shock.  Sanda Klein, MD, [place] [phone] 12/15/2016, 12:46 PM

## 2016-12-15 NOTE — Progress Notes (Signed)
PROGRESS NOTE    John Bailey  ZDG:[id number] DOB: Jul 11, 1970 DOA: 12/02/2016 PCP: [name], MD    Brief Narrative:  46 y.o.malewith a hx of diabetes, hypertension, atrial fibrillation on Xarelto, chronic systolic and diastolic CHF,  CKD4 admitted with severe vol overload, AKI and Afib RVR. 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 -Continue to encourage ambulation, OOB -Nephrology to consider dialysis options, plans to review vascular axis placement options as an outpatient given his progressive CK D. -Patient is continuing to improve    AKi on CKD4 -baseline creatinine around 2.5 -due to DM and hypertensive nephropathy and now with cardiorenal syndrome -Patient is continued on high dose IV lasix per Renal -Appreciate input by nephrology -Continue to monitor I's and O's and daily weights. -Continuing to void. Will repeat bmet in AM  Secondary LLE cellulitis -improving, continue on doxycycline as tolerated -monitor -Remains afebrile -Completed course of doxycycline    Paroxysmal atrial fibrillation ([place]) -with RVR on cardizem gtt -chadsvasc score is 5, on xarelto, may need to be switched to coumadin if creatinine  worsens -on PO cardizem  -Cardiology considering TEE with cardioversion when volume status improved -HR overall stable, as high as the 110's this AM -Discussed with Cardiology. Plan to continue current regimen. If stable overnight, then possible d/c home 10/3   HTN -BP currently stable at this time   Insulin-dependent diabetes mellitus with renal complications ([place]) -on lantus at home, now SSI  CBGS better now on increased lantus and meal coverage -Currently stable  DVT prophylaxis: Xarelto Code Status: Full Family Communication: Pt in room,family not at bedside Disposition Plan: Uncertain at this time  Consultants:   Cardiology  Nephrology  Procedures:     Antimicrobials: Anti-infectives    Start     Dose/Rate Route Frequency Ordered Stop   12/11/16 2200  doxycycline (VIBRA-TABS) tablet 100 mg  Status:  Discontinued     100 mg Oral Every 12 hours 12/11/16 1055 12/15/16 0947   12/05/16 0800  doxycycline (VIBRAMYCIN) 100 mg in dextrose 5 % 250 mL IVPB  Status:  Discontinued     100 mg 125 mL/hr over 120 Minutes Intravenous Every 12 hours 12/05/16 0755 12/11/16 1055   12/02/16 2000  piperacillin-tazobactam (ZOSYN) IVPB 3.375 g  Status:  Discontinued     3.375 g 12.5 mL/hr over 240 Minutes Intravenous Every 8 hours 12/02/16 1724 12/03/16 0756   12/02/16 1800  clindamycin (CLEOCIN) IVPB 900 mg  Status:  Discontinued     900 mg 100 mL/hr over 30 Minutes Intravenous Every 8 hours 12/02/16 1707 12/03/16 0756   12/02/16 1430  piperacillin-tazobactam (ZOSYN) IVPB 3.375 g     3.375 g 100 mL/hr over 30 Minutes Intravenous  Once 12/02/16 1424 12/02/16 1517   12/02/16  1430  vancomycin (VANCOCIN) IVPB 1000 mg/200 mL premix     1,000 mg 200 mL/hr over 60 Minutes Intravenous  Once 12/02/16 1424 12/02/16 1704      Subjective: No complaints at this time  Objective: Vitals:   12/14/16 2006 12/15/16 0452 12/15/16 0454 12/15/16 1220  BP: (!) 105/55 (!) 129/96  113/78  Pulse:  87 (!) 112  97  Resp: 18 18  16   Temp: 98.6 F (37 C) 99.1 F (37.3 C)    TempSrc: Oral Oral    SpO2: 97% 91%  94%  Weight:   132.2 kg (291 lb 7.2 oz)   Height:        Intake/Output Summary (Last 24 hours) at 12/15/16 1415 Last data filed at 12/15/16 0454  Gross per 24 hour  Intake              480 ml  Output             1300 ml  Net             -820 ml   Filed Weights   12/13/16 0455 12/14/16 0400 12/15/16 0454  Weight: 135.3 kg (298 lb 3.2 oz) 132.6 kg (292 lb 5.3 oz) 132.2 kg (291 lb 7.2 oz)    Examination: General exam: Conversant, in no acute distress Respiratory system: normal chest rise, clear, no audible wheezing Cardiovascular system: regular rhythm, s1-s2 Gastrointestinal system: Nondistended, nontender, pos BS Central nervous system: No seizures, no tremors Extremities: No cyanosis, no joint deformities Skin: No rashes, no pallor Psychiatry: Affect normal // no auditory hallucinations   Data Reviewed: I have personally reviewed following labs and imaging studies  CBC:  Recent Labs Lab 12/10/16 0533  WBC 12.5*  HGB 11.3*  HCT 35.1*  MCV 80.5  PLT 614*   Basic Metabolic Panel:  Recent Labs Lab 12/10/16 0533 12/11/16 0441 12/12/16 0446 12/13/16 0625 12/14/16 0528  NA 130* 129* 132* 133* 136  K 3.4* 3.4* 4.0 3.8 3.9  CL 83* 82* 84* 83* 85*  CO2 32 33* 31 35* 36*  GLUCOSE 119* 142* 125* 77 96  BUN 92* 96* 94* 92* 94*  CREATININE 2.84* 3.14* 3.12* 3.17* 3.16*  CALCIUM 8.3* 8.4* 8.7* 8.8* 9.1  MG  --  1.4* 1.8  --   --   PHOS 5.1* 5.5* 4.9* 5.4* 5.0*   GFR: Estimated Creatinine Clearance: 41.4 mL/min (A) (by C-G formula based on SCr of 3.16 mg/dL (H)). Liver Function Tests:  Recent Labs Lab 12/10/16 0533 12/11/16 0441 12/12/16 0446 12/13/16 0625 12/14/16 0528  ALBUMIN 2.2* 2.2* 2.2* 2.3* 2.4*   No results for input(s): LIPASE, AMYLASE in the last 168 hours. No results for input(s): AMMONIA in the last 168 hours. Coagulation  Profile: No results for input(s): INR, PROTIME in the last 168 hours. Cardiac Enzymes: No results for input(s): CKTOTAL, CKMB, CKMBINDEX, TROPONINI in the last 168 hours. BNP (last 3 results) No results for input(s): PROBNP in the last 8760 hours. HbA1C: No results for input(s): HGBA1C in the last 72 hours. CBG:  Recent Labs Lab 12/14/16 1134 12/14/16 1714 12/14/16 2113 12/15/16 0740 12/15/16 1147  GLUCAP 113* 109* 142* 112* 104*   Lipid Profile: No results for input(s): CHOL, HDL, LDLCALC, TRIG, CHOLHDL, LDLDIRECT in the last 72 hours. Thyroid Function Tests: No results for input(s): TSH, T4TOTAL, FREET4, T3FREE, THYROIDAB in the last 72 hours. Anemia Panel: No results for input(s): VITAMINB12, FOLATE, FERRITIN, TIBC, IRON, RETICCTPCT in the last 72 hours. Sepsis  Labs: No results for input(s): PROCALCITON, LATICACIDVEN in the last 168 hours.  Recent Results (from the past 240 hour(s))  Urine Culture     Status: Abnormal   Collection Time: 12/12/16  8:51 AM  Result Value Ref Range Status   Specimen Description URINE, CLEAN CATCH  Final   Special Requests NONE  Final   Culture (A)  Final    <10,000 COLONIES/mL INSIGNIFICANT GROWTH Performed at [facility], [address], [place], [place] [address]    Report Status 12/13/2016 FINAL  Final  Culture, blood (Routine X 2) w Reflex to ID Panel     Status: None (Preliminary result)   Collection Time: 12/12/16  9:06 AM  Result Value Ref Range Status   Specimen Description BLOOD BLOOD LEFT ARM  Final   Special Requests   Final    BOTTLES DRAWN AEROBIC AND ANAEROBIC Blood Culture results may not be optimal due to an excessive volume of blood received in culture bottles   Culture   Final    NO GROWTH 2 DAYS Performed at [facility], [place] [address], [place], [place] [address]    Report Status PENDING  Incomplete  Culture, blood (Routine X 2) w Reflex to ID Panel     Status: None (Preliminary result)   Collection  Time: 12/12/16  9:15 AM  Result Value Ref Range Status   Specimen Description BLOOD RIGHT WRIST  Final   Special Requests IN PEDIATRIC BOTTLE Blood Culture adequate volume  Final   Culture   Final    NO GROWTH 2 DAYS Performed at [facility], [place] [address], [place], [place] [address]    Report Status PENDING  Incomplete     Radiology Studies: No results found.  Scheduled Meds: . carvedilol  37.5 mg Oral BID WC  . colchicine  0.3 mg Oral Daily  . diltiazem  420 mg Oral Daily  . furosemide  160 mg Oral BID  . gabapentin  300 mg Oral TID  . insulin aspart  0-9 Units Subcutaneous TID WC  . insulin aspart  6 Units Subcutaneous TID WC  . insulin glargine  28 Units Subcutaneous QHS  . potassium chloride  20 mEq Oral BID  . rivaroxaban  15 mg Oral Q supper  . sodium chloride flush  3 mL Intravenous Q12H   Continuous Infusions: . sodium chloride    . sodium chloride    . sodium chloride       LOS: 13 days   CHIU, Orpah Melter, MD [facility] Pager [phone]  If 7PM-7AM, please contact night-coverage [email or web address] Password [email or web address] 12/15/2016, 2:15 PM

## 2016-12-15 NOTE — Progress Notes (Signed)
PT Cancellation Note  Patient Details Name: John Bailey MRN: [id number] DOB: [birth year]   Cancelled Treatment:    Reason Eval/Treat Not Completed: Attempted PT tx session x2. Pt declined participation on first attempt. On 2nd attempt, pt was bathing. Will check back another day.   Weston Anna, MPT Pager: [phone]

## 2016-12-15 NOTE — Progress Notes (Signed)
Occupational Therapy Treatment Patient Details Name: John Bailey MRN: [id number] DOB: 04/25/1970 Today's Date: 12/15/2016    History of present illness 46 yo male admitted with A fib, HF.   OT comments  OT left wide sock aid for pt . Wife will obtain AE and will trade out sock aid  Follow Up Recommendations  No OT follow up;Supervision/Assistance - 24 hour    Equipment Recommendations  3 in 1 bedside commode (bariatric BSC )    Recommendations for Other Services      Precautions / Restrictions Precautions Precautions: Fall       Mobility Bed Mobility Overal bed mobility: Modified Independent                Transfers Overall transfer level: Needs assistance Equipment used: None Transfers: Sit to/from [employer] Transfers Sit to Stand: Min guard;Supervision Stand pivot transfers: Supervision;Min guard                ADL either performed or assessed with clinical judgement   ADL       Grooming: Standing;Min guard               Lower Body Dressing: Minimal assistance;Sit to/from stand;Cueing for safety;Cueing for sequencing;With adaptive equipment Lower Body Dressing Details (indicate cue type and reason): AE instruction. Toilet Transfer: Designer, fashion/clothing and Hygiene: Min guard;Sit to/from stand;Cueing for safety;Cueing for sequencing         General ADL Comments: pt states wife will As as needed               Cognition Arousal/Alertness: Awake/alert Behavior During Therapy: WFL for tasks assessed/performed Overall Cognitive Status: Within Functional Limits for tasks assessed                                                General Comments  pt overall S- min A.  Pt will need A with home initially with ADL activity     Pertinent Vitals/ Pain       Pain Score: 3  Pain Location: LLE  Pain Descriptors / Indicators: Aching;Grimacing;Discomfort Pain Intervention(s): Monitored  during session;Limited activity within patient's tolerance;Repositioned         Frequency  Min 2X/week        Progress Toward Goals  OT Goals(current goals can now be found in the care plan section)  Progress towards OT goals: Progressing toward goals     Plan Discharge plan remains appropriate       AM-PAC PT "6 Clicks" Daily Activity     Outcome Measure   Help from another person eating meals?: None Help from another person taking care of personal grooming?: A Little Help from another person toileting, which includes using toliet, bedpan, or urinal?: A Little Help from another person bathing (including washing, rinsing, drying)?: A Little Help from another person to put on and taking off regular upper body clothing?: None Help from another person to put on and taking off regular lower body clothing?: A Lot 6 Click Score: 19    End of Session Equipment Utilized During Treatment: Rolling walker  OT Visit Diagnosis: Unsteadiness on feet (R26.81);Other abnormalities of gait and mobility (R26.89);Pain Pain - Right/Left: Left Pain - part of body: Ankle and joints of foot   Activity Tolerance Patient limited by lethargy   Patient Left with  call bell/phone within reach;in bed   Nurse Communication Mobility status        Time: 1010-1024 OT Time Calculation (min): 14 min  Charges: OT General Charges $OT Visit: 1 Visit OT Treatments $Self Care/Home Management : 8-22 mins  [place], [place]   Betsy Pries 12/15/2016, 1:26 PM

## 2016-12-16 ENCOUNTER — Other Ambulatory Visit: Payer: Self-pay | Admitting: Student

## 2016-12-16 DIAGNOSIS — N189 Chronic kidney disease, unspecified: Secondary | ICD-10-CM

## 2016-12-16 DIAGNOSIS — Z794 Long term (current) use of insulin: Secondary | ICD-10-CM

## 2016-12-16 DIAGNOSIS — I4891 Unspecified atrial fibrillation: Secondary | ICD-10-CM

## 2016-12-16 DIAGNOSIS — N289 Disorder of kidney and ureter, unspecified: Secondary | ICD-10-CM

## 2016-12-16 DIAGNOSIS — L03116 Cellulitis of left lower limb: Secondary | ICD-10-CM

## 2016-12-16 DIAGNOSIS — E1129 Type 2 diabetes mellitus with other diabetic kidney complication: Secondary | ICD-10-CM

## 2016-12-16 LAB — BASIC METABOLIC PANEL
Anion gap: 16 — ABNORMAL HIGH (ref 5–15)
BUN: 90 mg/dL — ABNORMAL HIGH (ref 6–20)
CALCIUM: 8.9 mg/dL (ref 8.9–10.3)
CO2: 34 mmol/L — ABNORMAL HIGH (ref 22–32)
CREATININE: 3.15 mg/dL — AB (ref 0.61–1.24)
Chloride: 85 mmol/L — ABNORMAL LOW (ref 101–111)
GFR calc non Af Amer: 22 mL/min — ABNORMAL LOW (ref >60)
GFR, EST AFRICAN AMERICAN: 26 mL/min — AB (ref >60)
Glucose, Bld: 148 mg/dL — ABNORMAL HIGH (ref 65–99)
Potassium: 4.5 mmol/L (ref 3.5–5.1)
SODIUM: 135 mmol/L (ref 135–145)

## 2016-12-16 LAB — GLUCOSE, CAPILLARY
GLUCOSE-CAPILLARY: 113 mg/dL — AB (ref 65–99)
GLUCOSE-CAPILLARY: 154 mg/dL — AB (ref 65–99)

## 2016-12-16 MED ORDER — BENZONATATE 100 MG PO CAPS: 100.0000 mg | ORAL_CAPSULE | Freq: Three times a day (TID) | ORAL | 0 refills | Status: DC | PRN

## 2016-12-16 MED ORDER — COLCHICINE 0.6 MG PO TABS: 0.3000 mg | ORAL_TABLET | Freq: Every day | ORAL | 0 refills | Status: DC

## 2016-12-16 MED ORDER — DILTIAZEM HCL ER COATED BEADS 360 MG PO CP24: 360.0000 mg | ORAL_CAPSULE | Freq: Every day | ORAL | 1 refills | Status: DC

## 2016-12-16 MED ORDER — INSULIN GLARGINE 100 UNIT/ML SOLOSTAR PEN: 15.0000 [IU] | PEN_INJECTOR | Freq: Every day | SUBCUTANEOUS | Status: DC

## 2016-12-16 MED ORDER — CARVEDILOL 12.5 MG PO TABS: 37.5000 mg | ORAL_TABLET | Freq: Two times a day (BID) | ORAL | 2 refills | Status: DC

## 2016-12-16 MED ORDER — FUROSEMIDE 80 MG PO TABS: 160.0000 mg | ORAL_TABLET | Freq: Two times a day (BID) | ORAL | 2 refills | Status: DC

## 2016-12-16 NOTE — Discharge Instructions (Addendum)
YOUR CARDIOVERSION IS SCHEDULED FOR 12/18/2016 at 9:00AM. PLEASE ARRIVE TO [place] (Short Stay) BY 7:30AM.

## 2016-12-16 NOTE — Discharge Summary (Signed)
Physician Discharge Summary  John Bailey KDX:[id number] DOB: 1970/07/25 DOA: 12/02/2016  PCP: [name], MD  Admit date: 12/02/2016 Discharge date: 12/16/2016  Time spent: 35 minutes  Recommendations for Outpatient Follow-up:  Repeat BMET to follow electrolytes and renal function Reassess BP and volume status  Outpatient follow up with cardiology for cardioversion and further eval/treatment of A. 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 Discharge home with instructions to follow up with PCP, cardiology and renal service as an outpatient.  Diet recommendation: heart healthy diet and modified carbohydrates   Filed Weights   12/14/16 0400 12/15/16 0454 12/16/16 0450  Weight: 132.6 kg (292 lb 5.3 oz) 132.2 kg (291 lb 7.2 oz) 132.1 kg (291 lb 3.6 oz)    History of present illness:  46 y.o.malewith a hx of diabetes, hypertension, atrial fibrillation on Xarelto, chronic systolic and diastolic CHF, CKD4 admitted with severe vol overload, AKI and Afib RVR.  Hospital Course:  Acute combined systolic (congestive) and diastolic (congestive) heart failure (HCC) -with profound edema, felt to be 20-30lbs above dry weight on admission -last ECHO with improvement in EF from 40 to 55% and grade 2DD -NICM due to hypertension -Currently on 160 mg PO BID. -Continue to encourage ambulation, OOB and low sodium diet. -Nephrology to consider dialysis options, plans to review vascular access placement options as an outpatient given his progressive CKD. -Patient stable and  ready for discharge -advise to follow low sodium diet   AKi on CKD4 -baseline creatinine around 2.5 -due to DM and hypertensive nephropathy and now with cardiorenal syndrome -Patient seen by renal service; now dose of lasix adjusted to 160 mg BID; will need outpatient follow up for vascular access and follow closely for potential initiation of HD when needed.  Secondary LLE cellulitis -improved/resolved -completed treatment with doxycycline -culprit most likely skin breakdown from extra swelling due to fluid overload.  Paroxysmal atrial fibrillation (HCC) -with RVR  -chadsvasc score is 5, on xarelto, may need to be switched to coumadin if creatinine continue worsening down the road  -rate controlled on cardizem and coreg -Cardiology considering TEE with cardioversion (planned for 12/18/16 as an outpatient)  HTN -BP is stable -will continue antihypertensive regimen  -advise to follow heart healthy diet   Insulin-dependent diabetes mellitus with renal complications ([place]) -advise to watch carbohydrates intake -will resume home hypoglycemic regimen  -follow and further adjustment to be done by PCP as an outpatient.  Obesity -Body mass index is 40.62 kg/m. -low calorie diet and exercise discussed with patient   Procedures:  See below for x-ray reports     Consultations:  Cardiology   Nephrology   Discharge Exam: Vitals:   12/16/16 0449 12/16/16 1339  BP: 114/78 105/77  Pulse: 88 80  Resp: 18 20  Temp: 98.6 F (37 C) (!) 97.5 F (36.4 C)  SpO2: 91% 100%   General exam: Conversant, in no acute distress; denies CP and SOB. Good O2 sat on RA. Respiratory system: normal chest rise, clear, no audible wheezing Cardiovascular system: slightly tachycardic with activity, but overall rate controlled, no rubs, no  gallops. Gastrointestinal system: Nondistended, nontender, pos BS Central nervous system: No seizures, no tremors; CN intact Extremities: No cyanosis,  no joint deformities; 1-2++ edema bilaterally Psychiatry: Affect normal // no auditory hallucinations    Discharge Instructions   Discharge Instructions    (HEART FAILURE PATIENTS) Call MD:  Anytime you have any of the following symptoms: 1) 3 pound weight gain in 24 hours or 5 pounds in 1 week 2) shortness of breath, with or without a dry hacking cough 3) swelling in the hands, feet or stomach 4) if you have to sleep on extra pillows at night in order to breathe.    Complete by:  As directed    Diet - low sodium heart healthy    Complete by:  As directed    Discharge instructions    Complete by:  As directed    Take medications as prescribed Please follow up with cardiology on Friday for cardioversion and further treatment/control of your atrial fibrillation. Arrange follow up with PCP in 2 weeks Follow low sodium diet (less than 2 gram daily) Maintain adequate hydration     Current Discharge Medication List    START taking these medications   Details  benzonatate (TESSALON) 100 MG capsule Take 1 capsule (100 mg total) by mouth 3 (three) times daily as needed for cough. Qty: 30 capsule, Refills: 0    diltiazem (CARDIZEM CD) 360 MG 24 hr capsule Take 1 capsule (360 mg total) by mouth daily. Qty: 30 capsule, Refills: 1      CONTINUE these medications which have CHANGED   Details  carvedilol (COREG) 12.5 MG tablet Take 3 tablets (37.5 mg total) by mouth 2 (two) times daily with a meal. Qty: 180 tablet, Refills: 2    colchicine 0.6 MG tablet Take 0.5 tablets (0.3 mg total) by mouth daily. Qty: 30 tablet, Refills: 0    furosemide (LASIX) 80 MG tablet Take 2 tablets (160 mg total) by mouth 2 (two) times daily. Qty: 120 tablet, Refills: 2    Insulin Glargine (LANTUS) 100 UNIT/ML Solostar Pen Inject 15 Units into the skin daily at 10 pm.      CONTINUE these medications which have NOT CHANGED   Details  acetaminophen-codeine (TYLENOL #3) 300-30 MG tablet Take 1 tablet by mouth  every 8 (eight) hours as needed for moderate pain or severe pain.  Refills: 0    fluticasone (FLONASE) 50 MCG/ACT nasal spray Place 2 sprays into the nose daily.     gabapentin (NEURONTIN) 300 MG capsule Take 1 capsule by mouth 3 (three) times daily. Refills: 0    insulin aspart (NOVOLOG FLEXPEN) 100 UNIT/ML FlexPen Inject 0-12 Units into the skin 3 (three) times daily as needed for high blood sugar. Sliding Scale <70, initiate hypoglycemia protocol, 70-100=0 units, 110-199=0 units, 200-250=4 units, 251-300=6 units, 301-350=8 units, 351-400=10 units, >400=12 units. Call MD if >450    insulin aspart (NOVOLOG PENFILL) cartridge Inject 5 Units into the skin 3 (three) times daily with meals. Qty: 15 mL, Refills: 11    Multiple Vitamin (MULTIVITAMIN WITH MINERALS) TABS tablet Take 2 tablets by mouth daily.    potassium chloride SA (KLOR-CON M20) 20 MEQ tablet Take 1 tablet (20 mEq total) by mouth daily. Qty: 90 tablet, Refills: 3    predniSONE (DELTASONE) 5 MG tablet Take 10 mg by mouth daily with breakfast.  Refills: 0    rivaroxaban (XARELTO) 20 MG TABS tablet take 1 tablet by mouth once daily WITH SUPPER Qty: 30 tablet, Refills:  0    albuterol (PROVENTIL HFA;VENTOLIN HFA) 108 (90 BASE) MCG/ACT inhaler Inhale 1-2 puffs into the lungs every 6 (six) hours as needed for wheezing. Qty: 1 Inhaler, Refills: 3    Insulin Pen Needle 31G X 6 MM MISC 1 Device by Does not apply route 2 (two) times daily. Qty: 60 each, Refills: 3      STOP taking these medications     hydrALAZINE (APRESOLINE) 50 MG tablet      isosorbide dinitrate (ISORDIL) 10 MG tablet      metolazone (ZAROXOLYN) 5 MG tablet      docusate sodium (COLACE) 100 MG capsule      HYDROcodone-acetaminophen (NORCO/VICODIN) 5-325 MG tablet      Ibuprofen-Famotidine (DUEXIS) 800-26.6 MG TABS      lactulose (CHRONULAC) 10 GM/15ML solution      methylPREDNISolone (MEDROL DOSEPAK) 4 MG TBPK tablet      oxyCODONE-acetaminophen  (PERCOCET/ROXICET) 5-325 MG tablet        No Known Allergies Follow-up Information    [facility] up.   Why:  Bariatric bedside commode Contact information: [place] [address] [phone]        [name], MD. Schedule an appointment as soon as possible for a visit in 2 week(s).   Specialty:  Internal Medicine Contact information: [place]. [place] [address] [phone]           The results of significant diagnostics from this hospitalization (including imaging, microbiology, ancillary and laboratory) are listed below for reference.    Significant Diagnostic Studies: Dg Chest Port 1 View  Result Date: 12/02/2016 CLINICAL DATA:  Shortness of Breath EXAM: PORTABLE CHEST 1 VIEW COMPARISON:  November 22, 2016 FINDINGS: There is no edema or consolidation. Heart is enlarged but stable. Pulmonary vascularity is normal. No adenopathy. No bone lesions. IMPRESSION: Stable cardiomegaly.  No edema or consolidation. Electronically Signed   By: Lowella Grip III M.D.   On: 12/02/2016 16:02   Dg Abd Acute W/chest  Result Date: 11/22/2016 CLINICAL DATA:  Constipation and rectal pain. Last bowel movement 14 days ago. History of diabetes and hypertension. EXAM: DG ABDOMEN ACUTE W/ 1V CHEST COMPARISON:  Chest 01/28/2016 FINDINGS: Shallow inspiration. Cardiac enlargement. Pulmonary vascular congestion with perihilar interstitial changes likely representing edema. No focal consolidation. No blunting of costophrenic angles. No pneumothorax. Scattered gas and stool throughout the colon. No small or large bowel distention. No free intra-abdominal air. No abnormal air-fluid levels. No radiopaque stones. Visualized bones appear intact. Surgical clips in the right upper quadrant. IMPRESSION: Congestive changes in the heart and lungs with cardiac enlargement, pulmonary vascular congestion, and perihilar edema. Nonobstructive bowel gas  pattern. Electronically Signed   By: Lucienne Capers M.D.   On: 11/22/2016 04:15    Microbiology: Recent Results (from the past 240 hour(s))  Urine Culture     Status: Abnormal   Collection Time: 12/12/16  8:51 AM  Result Value Ref Range Status   Specimen Description URINE, CLEAN CATCH  Final   Special Requests NONE  Final   Culture (A)  Final    <10,000 COLONIES/mL INSIGNIFICANT GROWTH Performed at [facility], [address], [place], [place] [address]    Report Status 12/13/2016 FINAL  Final  Culture, blood (Routine X 2) w Reflex to ID Panel     Status: None (Preliminary result)   Collection Time: 12/12/16  9:06 AM  Result Value Ref Range Status  Specimen Description BLOOD BLOOD LEFT ARM  Final   Special Requests   Final    BOTTLES DRAWN AEROBIC AND ANAEROBIC Blood Culture results may not be optimal due to an excessive volume of blood received in culture bottles   Culture   Final    NO GROWTH 3 DAYS Performed at [facility], [place] [address], [place], [place] [address]    Report Status PENDING  Incomplete  Culture, blood (Routine X 2) w Reflex to ID Panel     Status: None (Preliminary result)   Collection Time: 12/12/16  9:15 AM  Result Value Ref Range Status   Specimen Description BLOOD RIGHT WRIST  Final   Special Requests IN PEDIATRIC BOTTLE Blood Culture adequate volume  Final   Culture   Final    NO GROWTH 3 DAYS Performed at [facility], [place] [address], [place], [place] [address]    Report Status PENDING  Incomplete     Labs: Basic Metabolic Panel:  Recent Labs Lab 12/10/16 0533 12/11/16 0441 12/12/16 0446 12/13/16 0625 12/14/16 0528 12/15/16 1647 12/16/16 0501  NA 130* 129* 132* 133* 136 136 135  K 3.4* 3.4* 4.0 3.8 3.9 4.2 4.5  CL 83* 82* 84* 83* 85* 84* 85*  CO2 32 33* 31 35* 36* 37* 34*  GLUCOSE 119* 142* 125* 77 96 144* 148*  BUN 92* 96* 94* 92* 94* 90* 90*  CREATININE 2.84* 3.14* 3.12* 3.17* 3.16* 3.10* 3.15*  CALCIUM 8.3*  8.4* 8.7* 8.8* 9.1 8.8* 8.9  MG  --  1.4* 1.8  --   --   --   --   PHOS 5.1* 5.5* 4.9* 5.4* 5.0*  --   --    Liver Function Tests:  Recent Labs Lab 12/10/16 0533 12/11/16 0441 12/12/16 0446 12/13/16 0625 12/14/16 0528  ALBUMIN 2.2* 2.2* 2.2* 2.3* 2.4*   CBC:  Recent Labs Lab 12/10/16 0533  WBC 12.5*  HGB 11.3*  HCT 35.1*  MCV 80.5  PLT 413*   BNP (last 3 results)  Recent Labs  12/02/16 1405  BNP 554.8*    CBG:  Recent Labs Lab 12/15/16 1147 12/15/16 1715 12/15/16 2035 12/16/16 0836 12/16/16 1153  GLUCAP 104* 138* 191* 113* 154*     Signed:  Barton Dubois MD.  [facility] 12/16/2016, 2:52 PM

## 2016-12-16 NOTE — Progress Notes (Signed)
Progress Note  Patient Name: John Bailey Date of Encounter: 12/16/2016  Primary Cardiologist: Dr. Debara Pickett  Subjective   Breathing at baseline. Ambulated to the restroom without difficulty. No chest pain or palpitations.   Inpatient Medications    Scheduled Meds: . carvedilol  37.5 mg Oral BID WC  . colchicine  0.3 mg Oral Daily  . diltiazem  420 mg Oral Daily  . furosemide  160 mg Oral BID  . gabapentin  300 mg Oral TID  . insulin aspart  0-9 Units Subcutaneous TID WC  . insulin aspart  6 Units Subcutaneous TID WC  . insulin glargine  28 Units Subcutaneous QHS  . potassium chloride  20 mEq Oral BID  . rivaroxaban  15 mg Oral Q supper  . sodium chloride flush  3 mL Intravenous Q12H   Continuous Infusions: . sodium chloride    . sodium chloride    . sodium chloride     PRN Meds: sodium chloride, acetaminophen **OR** acetaminophen, albuterol, alum & mag hydroxide-simeth, benzonatate, levalbuterol, lip balm, menthol-cetylpyridinium, ondansetron **OR** ondansetron (ZOFRAN) IV, oxyCODONE, sodium chloride flush   Vital Signs    Vitals:   12/15/16 1517 12/15/16 2030 12/16/16 0449 12/16/16 0450  BP: 112/78 109/73 114/78   Pulse: 94 88 88   Resp: 17 18 18    Temp: 98.8 F (37.1 C) 98 F (36.7 C) 98.6 F (37 C)   TempSrc: Oral Oral Oral   SpO2: 93% 100% 91%   Weight:    291 lb 3.6 oz (132.1 kg)  Height:        Intake/Output Summary (Last 24 hours) at 12/16/16 1110 Last data filed at 12/16/16 0600  Gross per 24 hour  Intake              720 ml  Output             1700 ml  Net             -980 ml   Filed Weights   12/14/16 0400 12/15/16 0454 12/16/16 0450  Weight: 292 lb 5.3 oz (132.6 kg) 291 lb 7.2 oz (132.2 kg) 291 lb 3.6 oz (132.1 kg)    Telemetry    Atrial fibrillation, HR in 90's to low-100's.  - Personally Reviewed  ECG    No new tracings.   Physical Exam   General: Well developed, obese African American male appearing in no acute distress. Head:  Normocephalic, atraumatic.  Neck: Supple without bruits, JVD difficult to assess secondary to body habitus. Lungs:  Resp regular and unlabored, CTA without wheezing or rales. Heart: Irregularly irregular, S1, S2, no S3, S4, or murmur; no rub. Abdomen: Soft, non-tender, non-distended with normoactive bowel sounds. No hepatomegaly. No rebound/guarding. No obvious abdominal masses. Extremities: No clubbing or cyanosis, trace edema. Distal pedal pulses are 2+ bilaterally. Neuro: Alert and oriented X 3. Moves all extremities spontaneously. Psych: Normal affect.  Labs    Chemistry Recent Labs Lab 12/12/16 0446 12/13/16 0625 12/14/16 0528 12/15/16 1647 12/16/16 0501  NA 132* 133* 136 136 135  K 4.0 3.8 3.9 4.2 4.5  CL 84* 83* 85* 84* 85*  CO2 31 35* 36* 37* 34*  GLUCOSE 125* 77 96 144* 148*  BUN 94* 92* 94* 90* 90*  CREATININE 3.12* 3.17* 3.16* 3.10* 3.15*  CALCIUM 8.7* 8.8* 9.1 8.8* 8.9  ALBUMIN 2.2* 2.3* 2.4*  --   --   GFRNONAA 23* 22* 22* 23* 22*  GFRAA 26* 26* 26* 27* 26*  ANIONGAP 17* 15 15 15  16*     Hematology Recent Labs Lab 12/10/16 0533  WBC 12.5*  RBC 4.36  HGB 11.3*  HCT 35.1*  MCV 80.5  MCH 25.9*  MCHC 32.2  RDW 16.3*  PLT 413*    Cardiac EnzymesNo results for input(s): TROPONINI in the last 168 hours. No results for input(s): TROPIPOC in the last 168 hours.   BNPNo results for input(s): BNP, PROBNP in the last 168 hours.   DDimer No results for input(s): DDIMER in the last 168 hours.   Radiology    No results found.  Cardiac Studies   Echocardiogram: 06/2016 Study Conclusions  - Left ventricle: The cavity size is small. 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 Systolic function was normal. The estimated ejection fraction was in the range of 50% to 55%. Distal anterior/apical hypokinesis seen on Definity contrast images. Doppler parameters are consistent with  restrictive physiology (grade 3 diastolic dysfunction). The E/A ratio is >2.5. The E/e&' ratio is >20, suggesting markedly elevated LV filling pressure. - Mitral valve: Mitral B-notch noted, suggesting elevated LA pressure. - Left atrium: Moderately dilated. - Right ventricle: The cavity size was mildly dilated. Systolic function was normal. - Right atrium: Severely dilated. - Tricuspid valve: There was trivial regurgitation. - Pulmonary arteries: PA peak pressure: 19 mm Hg (S). - Inferior vena cava: The vessel was normal in size. The respirophasic diameter changes were in the normal range (>= 50%), consistent with normal central venous pressure. - Pericardium, extracardiac: Small pericardial effusion. Features were not consistent with tamponade physiology.  Impressions:  - Compared to a prior study in 2017, the LVEF has increased to 50-55% with distal anterior/apical hypokinesis as seen on Definity contrast images.  Patient Profile     46 y.o. male w/ PMH of morbid obesity, Type 2 DM, HTN, persistent atrial fibrillation (on Xarelto), and chronic diastolic CHF admitted with severe hypervolemia/acute on chronic diastolic heart failure. Has been improving after diuresis and increased rate control meds. Dry weight of 280-288 lbs.  Assessment & Plan    1. Acute on Chronic Diastolic CHF - has been diuresed with IV Lasix with a net output of -17.0L and weight down from 320 lbs to 291 lbs (dry weight between 280 - 288 lbs). Has been switched from IV Lasix to PO Lasix 160mg  BID by Nephrology and still have a net output of -740 mL yesterday. Continue with PO diuretics at current dosing.    2. Persistent Atrial Fibrillation  - rate control is much improved with HR in the 90's to low-100's.  Currently on Coreg 37.5mg  BID and Cardizem CD 420mg  daily.  - he reports good compliance with Xarelto. No evidence of active bleeding.  - plan is for DCCV on Friday (first  available). From a heart failure standpoint, he is stable for discharge. If deemed stable for discharge from an overall medical perspective, the procedure can be performed as an outpatient.   3. Acute on Chronic Stage 4 CKD - creatinine remains stable at 3.15.  - Nephrology following.   4. HTN - BP well-controlled at 109/73 - 114/78 within the past 24 hours.  - continue current medication regimen.   5. Morbid obesity - underlies his metabolic and cardiac problems.  Signed, Erma Heritage , PA-C 11:10 AM 12/16/2016 Pager: [phone]  I have seen and examined the patient along with Erma Heritage , PA-C.  I have reviewed the chart, notes  and new data.  I agree with PA/NP's note.  Key new complaints: breathing back to baseline, weight almost at baseline Key examination changes: marked improvement in edema. Good rate control today, in 70-80s. Key new findings / data: creatinine stable  PLAN: Plan elective DCCV on Friday 0900h. This procedure has been fully reviewed with the patient and written informed consent has been obtained. Critical to take Xarelto without interruption until DCCV and 30 days afterwards. Will need anticoagulation lifelong, but beyond 30 days, temporary interruption for surgery/bleeding complications can be allowed.   Sanda Klein, MD, [place] [phone] 12/16/2016, 12:25 PM

## 2016-12-16 NOTE — Care Management Note (Signed)
Case Management Note  Patient Details  Name: John Bailey MRN: [id number] Date of Birth: 1970-11-01  Subjective/Objective: dc home w/dme 3n1-[facility] has already delivered. No further CM needs.                   Action/Plan:d/c home w/dme   Expected Discharge Date:  12/16/16               Expected Discharge Plan:  Home/Self Care  In-House Referral:     Discharge planning Services  CM Consult  Post Acute Care Choice:    Choice offered to:  Patient  DME Arranged:  3-N-1 (bariatric bedside commode) DME Agency:  [place]:    [facility] Agency:     Status of Service:  Completed, signed off  If discussed at [place] of Stay Meetings, dates discussed:    Additional Comments:  Dessa Phi, RN 12/16/2016, 2:59 PM

## 2016-12-17 LAB — CULTURE, BLOOD (ROUTINE X 2)
Culture: NO GROWTH
Culture: NO GROWTH
Special Requests: ADEQUATE

## 2016-12-18 ENCOUNTER — Ambulatory Visit (HOSPITAL_COMMUNITY): Payer: BLUE CROSS/BLUE SHIELD | Admitting: Anesthesiology

## 2016-12-18 ENCOUNTER — Encounter (HOSPITAL_COMMUNITY)
Admission: RE | Disposition: A | Discharge status: Home / Self Care | Payer: Self-pay | Source: Ambulatory Visit | Attending: Cardiovascular Disease

## 2016-12-18 ENCOUNTER — Ambulatory Visit (HOSPITAL_COMMUNITY)
Admission: RE | Admit: 2016-12-18 | Discharge: 2016-12-18 | Disposition: A | Discharge status: Home / Self Care | Payer: BLUE CROSS/BLUE SHIELD | Source: Ambulatory Visit | Attending: Cardiovascular Disease | Admitting: Cardiovascular Disease

## 2016-12-18 ENCOUNTER — Encounter (HOSPITAL_COMMUNITY): Payer: Self-pay | Admitting: Emergency Medicine

## 2016-12-18 DIAGNOSIS — I481 Persistent atrial fibrillation: Secondary | ICD-10-CM

## 2016-12-18 DIAGNOSIS — E1122 Type 2 diabetes mellitus with diabetic chronic kidney disease: Secondary | ICD-10-CM | POA: Insufficient documentation

## 2016-12-18 DIAGNOSIS — I130 Hypertensive heart and chronic kidney disease with heart failure and stage 1 through stage 4 chronic kidney disease, or unspecified chronic kidney disease: Secondary | ICD-10-CM | POA: Diagnosis not present

## 2016-12-18 DIAGNOSIS — Z7901 Long term (current) use of anticoagulants: Secondary | ICD-10-CM | POA: Diagnosis not present

## 2016-12-18 DIAGNOSIS — Z794 Long term (current) use of insulin: Secondary | ICD-10-CM | POA: Diagnosis not present

## 2016-12-18 DIAGNOSIS — I5033 Acute on chronic diastolic (congestive) heart failure: Secondary | ICD-10-CM | POA: Diagnosis not present

## 2016-12-18 DIAGNOSIS — N184 Chronic kidney disease, stage 4 (severe): Secondary | ICD-10-CM | POA: Insufficient documentation

## 2016-12-18 DIAGNOSIS — Z6841 Body Mass Index (BMI) 40.0 and over, adult: Secondary | ICD-10-CM | POA: Insufficient documentation

## 2016-12-18 DIAGNOSIS — I4819 Other persistent atrial fibrillation: Secondary | ICD-10-CM

## 2016-12-18 HISTORY — PX: CARDIOVERSION: SHX1299

## 2016-12-18 SURGERY — CARDIOVERSION
Anesthesia: General

## 2016-12-18 MED ORDER — SODIUM CHLORIDE 0.9 % IV SOLN
INTRAVENOUS | Status: DC
  Administered 2016-12-18: 09:00:00 via INTRAVENOUS

## 2016-12-18 MED ORDER — LIDOCAINE 2% (20 MG/ML) 5 ML SYRINGE
INTRAMUSCULAR | Status: DC | PRN
  Administered 2016-12-18: 20 mg via INTRAVENOUS

## 2016-12-18 MED ORDER — PROPOFOL 10 MG/ML IV BOLUS
INTRAVENOUS | Status: DC | PRN
  Administered 2016-12-18: 100 mg via INTRAVENOUS

## 2016-12-18 NOTE — CV Procedure (Signed)
Electrical Cardioversion Procedure Note John Bailey [id number] [birth year]  Procedure: Electrical Cardioversion Indications:  Atrial Fibrillation  Procedure Details Consent: Risks of procedure as well as the alternatives and risks of each were explained to the (patient/caregiver).  Consent for procedure obtained. Time Out: Verified patient identification, verified procedure, site/side was marked, verified correct patient position, special equipment/implants available, medications/allergies/relevent history reviewed, required imaging and test results available.  Performed  Patient placed on cardiac monitor, pulse oximetry, supplemental oxygen as necessary.  Sedation given: Propofol Pacer pads placed anterior and posterior chest.  Cardioverted 1 time(s).  Cardioverted at [facility].  Evaluation Findings: Post procedure EKG shows: NSR Complications: None Patient did tolerate procedure well.   John Latch, MD 12/18/2016, 9:03 AM

## 2016-12-18 NOTE — Transfer of Care (Signed)
Immediate Anesthesia Transfer of Care Note  Patient: John Bailey  Procedure(s) Performed: CARDIOVERSION (N/A )  Patient Location: Endoscopy Unit  Anesthesia Type:General  Level of Consciousness: drowsy  Airway & Oxygen Therapy: Patient Spontanous Breathing  Post-op Assessment: Report given to RN and Post -op Vital signs reviewed and stable  Post vital signs: Reviewed and stable  Last Vitals:  Vitals:   12/18/16 0721  BP: 138/80  Pulse: (!) 101  Resp: (!) 22  Temp: 36.9 C  SpO2: 98%    Last Pain:  Vitals:   12/18/16 0721  TempSrc: Oral         Complications: No apparent anesthesia complications

## 2016-12-18 NOTE — Discharge Instructions (Signed)
Electrical Cardioversion, Care After °This sheet gives you information about how to care for yourself after your procedure. Your health care provider may also give you more specific instructions. If you have problems or questions, contact your health care provider. °What can I expect after the procedure? °After the procedure, it is common to have: °· Some redness on the skin where the shocks were given. ° °Follow these instructions at home: °· Do not drive for 24 hours if you were given a medicine to help you relax (sedative). °· Take over-the-counter and prescription medicines only as told by your health care provider. °· Ask your health care provider how to check your pulse. Check it often. °· Rest for 48 hours after the procedure or as told by your health care provider. °· Avoid or limit your caffeine use as told by your health care provider. °Contact a health care provider if: °· You feel like your heart is beating too quickly or your pulse is not regular. °· You have a serious muscle cramp that does not go away. °Get help right away if: °· You have discomfort in your chest. °· You are dizzy or you feel faint. °· You have trouble breathing or you are short of breath. °· Your speech is slurred. °· You have trouble moving an arm or leg on one side of your body. °· Your fingers or toes turn cold or blue. °This information is not intended to replace advice given to you by your health care provider. Make sure you discuss any questions you have with your health care provider. °Document Released: 12/21/2012 Document Revised: 10/04/2015 Document Reviewed: 09/06/2015 °Elsevier Interactive Patient Education © 2018 [employer] ° °

## 2016-12-18 NOTE — Interval H&P Note (Signed)
History and Physical Interval Note:  12/18/2016 8:09 AM  John Bailey  has presented today for surgery, with the diagnosis of afib  The various methods of treatment have been discussed with the patient and family. After consideration of risks, benefits and other options for treatment, the patient has consented to  Procedure(s): CARDIOVERSION (N/A) as a surgical intervention .  The patient's history has been reviewed, patient examined, no change in status, stable for surgery.  I have reviewed the patient's chart and labs.  Questions were answered to the patient's satisfaction.     [name], MD

## 2016-12-18 NOTE — Anesthesia Preprocedure Evaluation (Addendum)
Anesthesia Evaluation  Patient identified by MRN, date of birth, ID band Patient awake    Reviewed: Allergy & Precautions, NPO status , Patient's Chart, lab work & pertinent test results, reviewed documented beta blocker date and time   Airway Mallampati: III  TM Distance: >3 FB     Dental   Pulmonary shortness of breath,    breath sounds clear to auscultation       Cardiovascular hypertension, Pt. on medications and Pt. on home beta blockers +CHF   Rhythm:Irregular Rate:Normal     Neuro/Psych negative neurological ROS     GI/Hepatic negative GI ROS, Neg liver ROS,   Endo/Other  diabetes, Type 2, Insulin DependentMorbid obesity  Renal/GU Renal disease     Musculoskeletal   Abdominal   Peds  Hematology negative hematology ROS (+)   Anesthesia Other Findings   Reproductive/Obstetrics                            Anesthesia Physical Anesthesia Plan  ASA: III  Anesthesia Plan: General   Post-op Pain Management:    Induction: Intravenous  PONV Risk Score and Plan: 2 and Ondansetron, Propofol infusion and Treatment may vary due to age or medical condition  Airway Management Planned: Natural Airway and Mask  Additional Equipment:   Intra-op Plan:   Post-operative Plan: Extubation in OR  Informed Consent: I have reviewed the patients History and Physical, chart, labs and discussed the procedure including the risks, benefits and alternatives for the proposed anesthesia with the patient or authorized representative who has indicated his/her understanding and acceptance.     Plan Discussed with: CRNA  Anesthesia Plan Comments:        Anesthesia Quick Evaluation

## 2016-12-18 NOTE — H&P (View-Only) (Signed)
Progress Note  Patient Name: John Bailey Date of Encounter: 12/16/2016  Primary Cardiologist: Dr. Debara Pickett  Subjective   Breathing at baseline. Ambulated to the restroom without difficulty. No chest pain or palpitations.   Inpatient Medications    Scheduled Meds: . carvedilol  37.5 mg Oral BID WC  . colchicine  0.3 mg Oral Daily  . diltiazem  420 mg Oral Daily  . furosemide  160 mg Oral BID  . gabapentin  300 mg Oral TID  . insulin aspart  0-9 Units Subcutaneous TID WC  . insulin aspart  6 Units Subcutaneous TID WC  . insulin glargine  28 Units Subcutaneous QHS  . potassium chloride  20 mEq Oral BID  . rivaroxaban  15 mg Oral Q supper  . sodium chloride flush  3 mL Intravenous Q12H   Continuous Infusions: . sodium chloride    . sodium chloride    . sodium chloride     PRN Meds: sodium chloride, acetaminophen **OR** acetaminophen, albuterol, alum & mag hydroxide-simeth, benzonatate, levalbuterol, lip balm, menthol-cetylpyridinium, ondansetron **OR** ondansetron (ZOFRAN) IV, oxyCODONE, sodium chloride flush   Vital Signs    Vitals:   12/15/16 1517 12/15/16 2030 12/16/16 0449 12/16/16 0450  BP: 112/78 109/73 114/78   Pulse: 94 88 88   Resp: 17 18 18    Temp: 98.8 F (37.1 C) 98 F (36.7 C) 98.6 F (37 C)   TempSrc: Oral Oral Oral   SpO2: 93% 100% 91%   Weight:    291 lb 3.6 oz (132.1 kg)  Height:        Intake/Output Summary (Last 24 hours) at 12/16/16 1110 Last data filed at 12/16/16 0600  Gross per 24 hour  Intake              720 ml  Output             1700 ml  Net             -980 ml   Filed Weights   12/14/16 0400 12/15/16 0454 12/16/16 0450  Weight: 292 lb 5.3 oz (132.6 kg) 291 lb 7.2 oz (132.2 kg) 291 lb 3.6 oz (132.1 kg)    Telemetry    Atrial fibrillation, HR in 90's to low-100's.  - Personally Reviewed  ECG    No new tracings.   Physical Exam   General: Well developed, obese African American male appearing in no acute distress. Head:  Normocephalic, atraumatic.  Neck: Supple without bruits, JVD difficult to assess secondary to body habitus. Lungs:  Resp regular and unlabored, CTA without wheezing or rales. Heart: Irregularly irregular, S1, S2, no S3, S4, or murmur; no rub. Abdomen: Soft, non-tender, non-distended with normoactive bowel sounds. No hepatomegaly. No rebound/guarding. No obvious abdominal masses. Extremities: No clubbing or cyanosis, trace edema. Distal pedal pulses are 2+ bilaterally. Neuro: Alert and oriented X 3. Moves all extremities spontaneously. Psych: Normal affect.  Labs    Chemistry Recent Labs Lab 12/12/16 0446 12/13/16 0625 12/14/16 0528 12/15/16 1647 12/16/16 0501  NA 132* 133* 136 136 135  K 4.0 3.8 3.9 4.2 4.5  CL 84* 83* 85* 84* 85*  CO2 31 35* 36* 37* 34*  GLUCOSE 125* 77 96 144* 148*  BUN 94* 92* 94* 90* 90*  CREATININE 3.12* 3.17* 3.16* 3.10* 3.15*  CALCIUM 8.7* 8.8* 9.1 8.8* 8.9  ALBUMIN 2.2* 2.3* 2.4*  --   --   GFRNONAA 23* 22* 22* 23* 22*  GFRAA 26* 26* 26* 27* 26*  ANIONGAP 17* 15 15 15  16*     Hematology Recent Labs Lab 12/10/16 0533  WBC 12.5*  RBC 4.36  HGB 11.3*  HCT 35.1*  MCV 80.5  MCH 25.9*  MCHC 32.2  RDW 16.3*  PLT 413*    Cardiac EnzymesNo results for input(s): TROPONINI in the last 168 hours. No results for input(s): TROPIPOC in the last 168 hours.   BNPNo results for input(s): BNP, PROBNP in the last 168 hours.   DDimer No results for input(s): DDIMER in the last 168 hours.   Radiology    No results found.  Cardiac Studies   Echocardiogram: 06/2016 Study Conclusions  - Left ventricle: The cavity size is small. 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 Systolic function was normal. The estimated ejection fraction was in the range of 50% to 55%. Distal anterior/apical hypokinesis seen on Definity contrast images. Doppler parameters are consistent with  restrictive physiology (grade 3 diastolic dysfunction). The E/A ratio is >2.5. The E/e&' ratio is >20, suggesting markedly elevated LV filling pressure. - Mitral valve: Mitral B-notch noted, suggesting elevated LA pressure. - Left atrium: Moderately dilated. - Right ventricle: The cavity size was mildly dilated. Systolic function was normal. - Right atrium: Severely dilated. - Tricuspid valve: There was trivial regurgitation. - Pulmonary arteries: PA peak pressure: 19 mm Hg (S). - Inferior vena cava: The vessel was normal in size. The respirophasic diameter changes were in the normal range (>= 50%), consistent with normal central venous pressure. - Pericardium, extracardiac: Small pericardial effusion. Features were not consistent with tamponade physiology.  Impressions:  - Compared to a prior study in 2017, the LVEF has increased to 50-55% with distal anterior/apical hypokinesis as seen on Definity contrast images.  Patient Profile     46 y.o. male w/ PMH of morbid obesity, Type 2 DM, HTN, persistent atrial fibrillation (on Xarelto), and chronic diastolic CHF admitted with severe hypervolemia/acute on chronic diastolic heart failure. Has been improving after diuresis and increased rate control meds. Dry weight of 280-288 lbs.  Assessment & Plan    1. Acute on Chronic Diastolic CHF - has been diuresed with IV Lasix with a net output of -17.0L and weight down from 320 lbs to 291 lbs (dry weight between 280 - 288 lbs). Has been switched from IV Lasix to PO Lasix 160mg  BID by Nephrology and still have a net output of -740 mL yesterday. Continue with PO diuretics at current dosing.    2. Persistent Atrial Fibrillation  - rate control is much improved with HR in the 90's to low-100's.  Currently on Coreg 37.5mg  BID and Cardizem CD 420mg  daily.  - he reports good compliance with Xarelto. No evidence of active bleeding.  - plan is for DCCV on Friday (first  available). From a heart failure standpoint, he is stable for discharge. If deemed stable for discharge from an overall medical perspective, the procedure can be performed as an outpatient.   3. Acute on Chronic Stage 4 CKD - creatinine remains stable at 3.15.  - Nephrology following.   4. HTN - BP well-controlled at 109/73 - 114/78 within the past 24 hours.  - continue current medication regimen.   5. Morbid obesity - underlies his metabolic and cardiac problems.  Signed, Erma Heritage , PA-C 11:10 AM 12/16/2016 Pager: [phone]  I have seen and examined the patient along with Erma Heritage , PA-C.  I have reviewed the chart, notes  and new data.  I agree with PA/NP's note.  Key new complaints: breathing back to baseline, weight almost at baseline Key examination changes: marked improvement in edema. Good rate control today, in 70-80s. Key new findings / data: creatinine stable  PLAN: Plan elective DCCV on Friday 0900h. This procedure has been fully reviewed with the patient and written informed consent has been obtained. Critical to take Xarelto without interruption until DCCV and 30 days afterwards. Will need anticoagulation lifelong, but beyond 30 days, temporary interruption for surgery/bleeding complications can be allowed.   Sanda Klein, MD, [place] [phone] 12/16/2016, 12:25 PM

## 2016-12-18 NOTE — Anesthesia Postprocedure Evaluation (Signed)
Anesthesia Post Note  Patient: John Bailey  Procedure(s) Performed: CARDIOVERSION (N/A )     Patient location during evaluation: PACU Anesthesia Type: General Level of consciousness: awake and alert Pain management: pain level controlled Vital Signs Assessment: post-procedure vital signs reviewed and stable Respiratory status: spontaneous breathing, nonlabored ventilation, respiratory function stable and patient connected to nasal cannula oxygen Cardiovascular status: blood pressure returned to baseline and stable Postop Assessment: no apparent nausea or vomiting Anesthetic complications: no    Last Vitals:  Vitals:   12/18/16 0920 12/18/16 0930  BP: 113/77 110/84  Pulse: 80 83  Resp: 19 (!) 21  Temp:    SpO2: 97% 93%    Last Pain:  Vitals:   12/18/16 0721  TempSrc: Oral                 Tiajuana Amass

## 2016-12-18 NOTE — Interval H&P Note (Signed)
History and Physical Interval Note:  12/18/2016 8:56 AM  John Bailey  has presented today for surgery, with the diagnosis of afib  The various methods of treatment have been discussed with the patient and family. After consideration of risks, benefits and other options for treatment, the patient has consented to  Procedure(s): CARDIOVERSION (N/A) as a surgical intervention .  The patient's history has been reviewed, patient examined, no change in status, stable for surgery.  I have reviewed the patient's chart and labs.  Questions were answered to the patient's satisfaction.     [name], MD

## 2016-12-19 ENCOUNTER — Encounter (HOSPITAL_COMMUNITY): Payer: Self-pay | Admitting: Cardiovascular Disease

## 2016-12-21 ENCOUNTER — Encounter: Payer: Self-pay | Admitting: *Deleted

## 2016-12-28 NOTE — Progress Notes (Signed)
Cardiology Office Note    Date:  12/29/2016   ID:  John Bailey, DOB Jan 13, 1971, MRN [id number]  PCP:  [name], MD  Cardiologist: Dr. Debara Pickett   Chief Complaint  Patient presents with  . Hospitalization Follow-up    History of Present Illness:    John Bailey is a 46 y.o. male with past medical history of morbid obesity, Type 2DM, HTN, persistent atrial fibrillation (on Xarelto, s/p DCCV in 07/2015 and 12/2016), chronic diastolic CHF (EF 42-59% by echo in 06/2016), and Stage 4 CKD who presents to the office today for hospital follow-up.   He was recently admitted to [facility] from 9/19 - 12/16/2016 for severe hypervolemia and acute on chronic diastolic CHF. He was diuresed over -17.0L with weight down from 320 lbs on admission to 291 lbs at the time of hospital discharge. Nephrology's assistance was appreciated as creatinine peaked at 3.34 during admission but was trending down to 3.15 at the time of hospital discharge. He was in atrial fibrillation during admission and HR's were sustaining in the 90's to low-100's even on Coreg 37.5mg  BID and Cardizem CD 420mg  daily. Therefore, an outpatient DCCV was pursued on 12/18/2016 with return to NSR.   In talking with the patient today, he reports doing well since his recent hospitalization. He has continued to trend his weights is down to 282 lbs today. He denies any recent dyspnea on exertion, orthopnea, PND, or lower extremity edema.  A repeat EKG is obtained today and unfortunately he is back in atrial fibrillation. Thankfully, his heart rate is well controlled in the 80's. He denies any recent chest pain or palpitations. He reports good compliance with his Xarelto and denies missing any recent doses. No evidence of active bleeding.   Past Medical History:  Diagnosis Date  . Atrial flutter with rapid ventricular response ([place]) 08/06/2015   a. s/p DCCV in 07/2015 with recurrent atrial fibrillation and DCCV in 12/2016.  Initially successful but noted to be back in atrial fibrillation within 2 weeks of DCCV.   [name] Chronic diastolic (congestive) heart failure (HCC)    a. EF 50-55% by echo in 06/2016.  . Diabetes ([place])   . Hypertension     Past Surgical History:  Procedure Laterality Date  . CARDIOVERSION N/A 08/01/2015   Procedure: CARDIOVERSION;  Surgeon: Josue Hector, MD;  Location: [facility] ENDOSCOPY;  Service: Cardiovascular;  Laterality: N/A;  . CARDIOVERSION N/A 12/18/2016   Procedure: CARDIOVERSION;  Surgeon: [name], MD;  Location: [place];  Service: Cardiovascular;  Laterality: N/A;  . CHOLECYSTECTOMY    . TEE WITHOUT CARDIOVERSION N/A 08/01/2015   Procedure: TRANSESOPHAGEAL ECHOCARDIOGRAM (TEE);  Surgeon: Josue Hector, MD;  Location[facility] ENDOSCOPY;  Service: Cardiovascular;  Laterality: N/A;    Current Medications: Outpatient Medications Prior to Visit  Medication Sig Dispense Refill  . acetaminophen-codeine (TYLENOL #3) 300-30 MG tablet Take 1 tablet by mouth every 8 (eight) hours as needed for moderate pain or severe pain.   0  . albuterol (PROVENTIL HFA;VENTOLIN HFA) 108 (90 BASE) MCG/ACT inhaler Inhale 1-2 puffs into the lungs every 6 (six) hours as needed for wheezing. 1 Inhaler 3  . benzonatate (TESSALON) 100 MG capsule Take 1 capsule (100 mg total) by mouth 3 (three) times daily as needed for cough. 30 capsule 0  . carvedilol (COREG) 12.5 MG tablet Take 3 tablets (37.5 mg total) by mouth 2 (two) times daily with a meal. 180 tablet 2  . colchicine 0.6 MG  tablet Take 0.5 tablets (0.3 mg total) by mouth daily. 30 tablet 0  . fluticasone (FLONASE) 50 MCG/ACT nasal spray Place 2 sprays into the nose daily.     . furosemide (LASIX) 80 MG tablet Take 2 tablets (160 mg total) by mouth 2 (two) times daily. 120 tablet 2  . gabapentin (NEURONTIN) 300 MG capsule Take 1 capsule by mouth 3 (three) times daily.  0  . insulin aspart (NOVOLOG FLEXPEN) 100 UNIT/ML FlexPen Inject 0-12 Units into the  skin 3 (three) times daily as needed for high blood sugar. Sliding Scale <70, initiate hypoglycemia protocol, 70-100=0 units, 110-199=0 units, 200-250=4 units, 251-300=6 units, 301-350=8 units, 351-400=10 units, >400=12 units. Call MD if >450    . insulin aspart (NOVOLOG PENFILL) cartridge Inject 5 Units into the skin 3 (three) times daily with meals. 15 mL 11  . Insulin Glargine (LANTUS) 100 UNIT/ML Solostar Pen Inject 15 Units into the skin daily at 10 pm.    . Insulin Pen Needle 31G X 6 MM MISC 1 Device by Does not apply route 2 (two) times daily. 60 each 3  . Multiple Vitamin (MULTIVITAMIN WITH MINERALS) TABS tablet Take 2 tablets by mouth daily.    . potassium chloride SA (KLOR-CON M20) 20 MEQ tablet Take 1 tablet (20 mEq total) by mouth daily. 90 tablet 3  . predniSONE (DELTASONE) 5 MG tablet Take 10 mg by mouth daily with breakfast.   0  . rivaroxaban (XARELTO) 20 MG TABS tablet take 1 tablet by mouth once daily WITH SUPPER 30 tablet 0  . diltiazem (CARDIZEM CD) 360 MG 24 hr capsule Take 1 capsule (360 mg total) by mouth daily. 30 capsule 1   No facility-administered medications prior to visit.      Allergies:   Patient has no known allergies.   Social History   Social History  . Marital status: Married    Spouse name: N/A  . Number of children: N/A  . Years of education: N/A   Social History Main Topics  . Smoking status: Never Smoker  . Smokeless tobacco: Never Used  . Alcohol use No  . Drug use: No  . Sexual activity: Yes   Other Topics Concern  . None   Social History Narrative  . None     Family History:  The patient's family history includes Heart disease in his mother; Hyperlipidemia in his father; Hypertension in his father.   Review of Systems:   Please see the history of present illness.     General:  No chills, fever, or night sweats. Positive for weight loss.  Cardiovascular:  No chest pain, dyspnea on exertion, edema, orthopnea, palpitations, paroxysmal  nocturnal dyspnea. Dermatological: No rash, lesions/masses Respiratory: No cough, dyspnea Urologic: No hematuria, dysuria Abdominal:   No nausea, vomiting, diarrhea, bright red blood per rectum, melena, or hematemesis Neurologic:  No visual changes, wkns, changes in mental status.  All other systems reviewed and are otherwise negative except as noted above.   Physical Exam:    VS:  BP 140/90   Pulse 88   Ht 5\' 11"  (1.803 m)   Wt 282 lb 6.4 oz (128.1 kg)   BMI 39.39 kg/m    General: Well developed, well nourished [place] American male appearing in no acute distress. Head: Normocephalic, atraumatic, sclera non-icteric, no xanthomas, nares are without discharge.  Neck: No carotid bruits. JVD not elevated.  Lungs: Respirations regular and unlabored, without wheezes or rales.  Heart: Irregularly irregular. No S3 or S4.  No murmur, no rubs, or gallops appreciated. Abdomen: Soft, non-tender, non-distended with normoactive bowel sounds. No hepatomegaly. No rebound/guarding. No obvious abdominal masses. Msk:  Strength and tone appear normal for age. No joint deformities or effusions. Extremities: No clubbing or cyanosis. No lower extremity edema.  Distal pedal pulses are 2+ bilaterally. Left calf is wrapped in a bandage and an ulcerated area is noted which is malodorous (improving per the patient's report).  Neuro: Alert and oriented X 3. Moves all extremities spontaneously. No focal deficits noted. Psych:  Responds to questions appropriately with a normal affect. Skin: No rashes or lesions noted  Wt Readings from Last 3 Encounters:  12/29/16 282 lb 6.4 oz (128.1 kg)  12/18/16 291 lb (132 kg)  12/16/16 291 lb 3.6 oz (132.1 kg)      Studies/Labs Reviewed:   EKG:  EKG is ordered today.  The ekg ordered today demonstrates atrial fibrillation, HR 88, with RBBB.   Recent Labs: 12/02/2016: B Natriuretic Peptide 554.8 12/03/2016: ALT 28 12/10/2016: Hemoglobin 11.3; Platelets 413 12/12/2016:  Magnesium 1.8 12/16/2016: BUN 90; Creatinine, Ser 3.15; Potassium 4.5; Sodium 135   Lipid Panel No results found for: CHOL, TRIG, HDL, CHOLHDL, VLDL, LDLCALC, LDLDIRECT  Additional studies/ records that were reviewed today include:   Echocardiogram: 06/2016 Study Conclusions  - Left ventricle: The cavity size is small. 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 Systolic function was normal. The   estimated ejection fraction was in the range of 50% to 55%.   Distal anterior/apical hypokinesis seen on Definity contrast   images. Doppler parameters are consistent with restrictive   physiology (grade 3 diastolic dysfunction). The E/A ratio is   >2.5. The E/e&' ratio is >20, suggesting markedly elevated LV   filling pressure. - Mitral valve: Mitral B-notch noted, suggesting elevated LA   pressure. - Left atrium: Moderately dilated. - Right ventricle: The cavity size was mildly dilated. Systolic   function was normal. - Right atrium: Severely dilated. - Tricuspid valve: There was trivial regurgitation. - Pulmonary arteries: PA peak pressure: 19 mm Hg (S). - Inferior vena cava: The vessel was normal in size. The   respirophasic diameter changes were in the normal range (>= 50%),   consistent with normal central venous pressure. - Pericardium, extracardiac: Small pericardial effusion. Features   were not consistent with tamponade physiology.  Impressions:  - Compared to a prior study in 2017, the LVEF has increased to   50-55% with distal anterior/apical hypokinesis as seen on   Definity contrast images.   Assessment:    1. Chronic diastolic CHF (congestive heart failure) (HCC)   2. Persistent atrial fibrillation ([place])   3. Long term (current) use of anticoagulants   4. Open wound of left lower leg, initial encounter   5. Essential hypertension   6. CKD (chronic kidney disease) stage 4, GFR 15-29 ml/min  (HCC)      Plan:   In order of problems listed above:  1. Chronic Diastolic CHF - EF was 14-48% by echo in 06/2016. Recently admitted for acute on chronic diastolic CHF during which he diuresed over 17.0L with weight down from 320 lbs on admission to 291 lbs at the time of hospital discharge. - he reports doing well since hospital discharge and denies any recent dyspnea on exertion, orthopnea, PND, or lower extremity edema. Weight has continued to trend down to 282 lbs today. He does not appear volume overloaded  by physical examination.   - continue on Lasix 160mg  BID. Will defer dose adjustment to Nephrology.   2. Persistent Atrial Fibrillation - Initially underwent a cardioversion in 07/2015. He experienced recurrent atrial fibrillation during his recent admission and underwent an outpatient cardioversion on 12/18/2016 with return to NSR. Since then, he has gone back into atrial fibrillation but is unaware of this rhythm. No associated palpitations or dyspnea. Heart rate is well controlled in the 80's. We discussed antiarrhythmic options but he prefers a rate control strategy at this point which is reasonable as long as HR remains well-controlled.  - he denies any evidence of active bleeding. Continue Xarelto for anticoagulation.   3. Open Wound of Left Leg - noted to have a small ulceration along his left calf which is improving per his report but is expressing clear drainage and very malodorous. No surrounding erythema. Has been present since hospital discharge.  - will refer to the Wound Clinic.   4. HTN - BP is at 140/90 during today's visit. Reports it is usually in the 120's-130's/80's when checked at home.  - continue Coreg 37.5mg  BID and Cardizem CD 360mg  daily.   5. Stage 4 CKD - creatinine stable at 3.15 on 12/16/2016. He has scheduled follow-up with Nephrology next week and will obtain a repeat BMET at that time. I informed him that if the appointment is changed, he can return  here for a BMET next week.     Medication Adjustments/Labs and Tests Ordered: Current medicines are reviewed at length with the patient today.  Concerns regarding medicines are outlined above.  Medication changes, Labs and Tests ordered today are listed in the Patient Instructions below. Patient Instructions  You have been referred to the Wound Clinic at [facility] for the wound on your left calf.  John Pho, PA recommends that you schedule a follow-up appointment in 3 months with Dr Debara Pickett.  If you need a refill on your cardiac medications before your next appointment, please call your pharmacy.    Signed, Erma Heritage, PA-C  12/29/2016 5:01 PM    [facility] [address], [place], [place]  [address] Phone: [phone]; Fax: [phone]  [address], [place],  [address] Phone: [phone]

## 2016-12-29 ENCOUNTER — Encounter: Payer: Self-pay | Admitting: Student

## 2016-12-29 ENCOUNTER — Ambulatory Visit (INDEPENDENT_AMBULATORY_CARE_PROVIDER_SITE_OTHER): Payer: BLUE CROSS/BLUE SHIELD | Admitting: Student

## 2016-12-29 VITALS — BP 140/90 | HR 88 | Ht 71.0 in | Wt 282.4 lb

## 2016-12-29 DIAGNOSIS — I481 Persistent atrial fibrillation: Secondary | ICD-10-CM

## 2016-12-29 DIAGNOSIS — N184 Chronic kidney disease, stage 4 (severe): Secondary | ICD-10-CM

## 2016-12-29 DIAGNOSIS — Z7901 Long term (current) use of anticoagulants: Secondary | ICD-10-CM | POA: Diagnosis not present

## 2016-12-29 DIAGNOSIS — S81802A Unspecified open wound, left lower leg, initial encounter: Secondary | ICD-10-CM

## 2016-12-29 DIAGNOSIS — I5032 Chronic diastolic (congestive) heart failure: Secondary | ICD-10-CM | POA: Diagnosis not present

## 2016-12-29 DIAGNOSIS — I10 Essential (primary) hypertension: Secondary | ICD-10-CM | POA: Diagnosis not present

## 2016-12-29 DIAGNOSIS — I4819 Other persistent atrial fibrillation: Secondary | ICD-10-CM

## 2016-12-29 MED ORDER — DILTIAZEM HCL ER COATED BEADS 360 MG PO CP24: 360.0000 mg | ORAL_CAPSULE | Freq: Every day | ORAL | 3 refills | Status: DC

## 2016-12-29 NOTE — Patient Instructions (Signed)
You have been referred to the Wound Clinic at [facility] for the wound on your left calf.  Bernerd Pho, PA recommends that you schedule a follow-up appointment in 3 months with Dr Debara Pickett.  If you need a refill on your cardiac medications before your next appointment, please call your pharmacy.

## 2017-01-15 ENCOUNTER — Encounter (HOSPITAL_BASED_OUTPATIENT_CLINIC_OR_DEPARTMENT_OTHER): Payer: BLUE CROSS/BLUE SHIELD | Attending: Internal Medicine

## 2017-01-15 DIAGNOSIS — Z794 Long term (current) use of insulin: Secondary | ICD-10-CM | POA: Insufficient documentation

## 2017-01-15 DIAGNOSIS — E11622 Type 2 diabetes mellitus with other skin ulcer: Secondary | ICD-10-CM | POA: Insufficient documentation

## 2017-01-15 DIAGNOSIS — I5032 Chronic diastolic (congestive) heart failure: Secondary | ICD-10-CM | POA: Diagnosis not present

## 2017-01-15 DIAGNOSIS — I130 Hypertensive heart and chronic kidney disease with heart failure and stage 1 through stage 4 chronic kidney disease, or unspecified chronic kidney disease: Secondary | ICD-10-CM | POA: Diagnosis not present

## 2017-01-15 DIAGNOSIS — N184 Chronic kidney disease, stage 4 (severe): Secondary | ICD-10-CM | POA: Insufficient documentation

## 2017-01-15 DIAGNOSIS — L97822 Non-pressure chronic ulcer of other part of left lower leg with fat layer exposed: Secondary | ICD-10-CM | POA: Diagnosis not present

## 2017-01-15 DIAGNOSIS — E1140 Type 2 diabetes mellitus with diabetic neuropathy, unspecified: Secondary | ICD-10-CM | POA: Insufficient documentation

## 2017-01-15 DIAGNOSIS — E1122 Type 2 diabetes mellitus with diabetic chronic kidney disease: Secondary | ICD-10-CM | POA: Insufficient documentation

## 2017-01-15 DIAGNOSIS — I2510 Atherosclerotic heart disease of native coronary artery without angina pectoris: Secondary | ICD-10-CM | POA: Insufficient documentation

## 2017-01-15 DIAGNOSIS — I87332 Chronic venous hypertension (idiopathic) with ulcer and inflammation of left lower extremity: Secondary | ICD-10-CM | POA: Diagnosis present

## 2017-01-15 DIAGNOSIS — I4891 Unspecified atrial fibrillation: Secondary | ICD-10-CM | POA: Diagnosis not present

## 2017-01-15 DIAGNOSIS — I890 Lymphedema, not elsewhere classified: Secondary | ICD-10-CM | POA: Diagnosis not present

## 2017-01-22 DIAGNOSIS — I87332 Chronic venous hypertension (idiopathic) with ulcer and inflammation of left lower extremity: Secondary | ICD-10-CM | POA: Diagnosis not present

## 2017-01-29 DIAGNOSIS — I87332 Chronic venous hypertension (idiopathic) with ulcer and inflammation of left lower extremity: Secondary | ICD-10-CM | POA: Diagnosis not present

## 2017-02-01 ENCOUNTER — Ambulatory Visit: Payer: BLUE CROSS/BLUE SHIELD | Admitting: Internal Medicine

## 2017-02-01 DIAGNOSIS — I87332 Chronic venous hypertension (idiopathic) with ulcer and inflammation of left lower extremity: Secondary | ICD-10-CM | POA: Diagnosis not present

## 2017-02-11 DIAGNOSIS — I87332 Chronic venous hypertension (idiopathic) with ulcer and inflammation of left lower extremity: Secondary | ICD-10-CM | POA: Diagnosis not present

## 2017-02-16 ENCOUNTER — Other Ambulatory Visit: Payer: Self-pay | Admitting: Internal Medicine

## 2017-02-16 DIAGNOSIS — L97929 Non-pressure chronic ulcer of unspecified part of left lower leg with unspecified severity: Secondary | ICD-10-CM

## 2017-02-16 DIAGNOSIS — L97909 Non-pressure chronic ulcer of unspecified part of unspecified lower leg with unspecified severity: Secondary | ICD-10-CM

## 2017-02-18 ENCOUNTER — Encounter (HOSPITAL_BASED_OUTPATIENT_CLINIC_OR_DEPARTMENT_OTHER): Payer: BLUE CROSS/BLUE SHIELD | Attending: Internal Medicine

## 2017-02-18 DIAGNOSIS — I87332 Chronic venous hypertension (idiopathic) with ulcer and inflammation of left lower extremity: Secondary | ICD-10-CM | POA: Insufficient documentation

## 2017-02-18 DIAGNOSIS — E11622 Type 2 diabetes mellitus with other skin ulcer: Secondary | ICD-10-CM | POA: Diagnosis not present

## 2017-02-18 DIAGNOSIS — I890 Lymphedema, not elsewhere classified: Secondary | ICD-10-CM | POA: Insufficient documentation

## 2017-02-18 DIAGNOSIS — I4891 Unspecified atrial fibrillation: Secondary | ICD-10-CM | POA: Diagnosis not present

## 2017-02-18 DIAGNOSIS — Z7901 Long term (current) use of anticoagulants: Secondary | ICD-10-CM | POA: Diagnosis not present

## 2017-02-18 DIAGNOSIS — I2510 Atherosclerotic heart disease of native coronary artery without angina pectoris: Secondary | ICD-10-CM | POA: Diagnosis not present

## 2017-02-18 DIAGNOSIS — I130 Hypertensive heart and chronic kidney disease with heart failure and stage 1 through stage 4 chronic kidney disease, or unspecified chronic kidney disease: Secondary | ICD-10-CM | POA: Diagnosis not present

## 2017-02-18 DIAGNOSIS — I5032 Chronic diastolic (congestive) heart failure: Secondary | ICD-10-CM | POA: Insufficient documentation

## 2017-02-18 DIAGNOSIS — L97228 Non-pressure chronic ulcer of left calf with other specified severity: Secondary | ICD-10-CM | POA: Insufficient documentation

## 2017-02-18 DIAGNOSIS — N184 Chronic kidney disease, stage 4 (severe): Secondary | ICD-10-CM | POA: Diagnosis not present

## 2017-02-18 DIAGNOSIS — E1140 Type 2 diabetes mellitus with diabetic neuropathy, unspecified: Secondary | ICD-10-CM | POA: Diagnosis not present

## 2017-02-18 DIAGNOSIS — E1122 Type 2 diabetes mellitus with diabetic chronic kidney disease: Secondary | ICD-10-CM | POA: Insufficient documentation

## 2017-02-18 DIAGNOSIS — M109 Gout, unspecified: Secondary | ICD-10-CM | POA: Diagnosis not present

## 2017-02-18 DIAGNOSIS — Z794 Long term (current) use of insulin: Secondary | ICD-10-CM | POA: Diagnosis not present

## 2017-02-19 ENCOUNTER — Ambulatory Visit (HOSPITAL_COMMUNITY)
Admission: RE | Admit: 2017-02-19 | Discharge: 2017-02-19 | Disposition: A | Discharge status: Home / Self Care | Payer: BLUE CROSS/BLUE SHIELD | Source: Ambulatory Visit | Attending: Cardiovascular Disease | Admitting: Cardiovascular Disease

## 2017-02-19 DIAGNOSIS — E11622 Type 2 diabetes mellitus with other skin ulcer: Secondary | ICD-10-CM | POA: Insufficient documentation

## 2017-02-19 DIAGNOSIS — E785 Hyperlipidemia, unspecified: Secondary | ICD-10-CM | POA: Diagnosis not present

## 2017-02-19 DIAGNOSIS — L97929 Non-pressure chronic ulcer of unspecified part of left lower leg with unspecified severity: Secondary | ICD-10-CM | POA: Diagnosis not present

## 2017-02-19 DIAGNOSIS — I10 Essential (primary) hypertension: Secondary | ICD-10-CM | POA: Diagnosis not present

## 2017-02-19 DIAGNOSIS — L97229 Non-pressure chronic ulcer of left calf with unspecified severity: Secondary | ICD-10-CM | POA: Insufficient documentation

## 2017-02-25 DIAGNOSIS — E11622 Type 2 diabetes mellitus with other skin ulcer: Secondary | ICD-10-CM | POA: Diagnosis not present

## 2017-03-04 DIAGNOSIS — E11622 Type 2 diabetes mellitus with other skin ulcer: Secondary | ICD-10-CM | POA: Diagnosis not present

## 2017-03-06 ENCOUNTER — Inpatient Hospital Stay (HOSPITAL_COMMUNITY)
Admission: EM | Admit: 2017-03-06 | Discharge: 2017-03-09 | DRG: 553 | Disposition: A | Discharge status: Home / Self Care | Payer: BLUE CROSS/BLUE SHIELD | Attending: Internal Medicine | Admitting: Internal Medicine

## 2017-03-06 ENCOUNTER — Emergency Department (HOSPITAL_COMMUNITY): Payer: BLUE CROSS/BLUE SHIELD

## 2017-03-06 ENCOUNTER — Encounter (HOSPITAL_COMMUNITY): Payer: Self-pay | Admitting: Emergency Medicine

## 2017-03-06 DIAGNOSIS — I959 Hypotension, unspecified: Secondary | ICD-10-CM | POA: Diagnosis present

## 2017-03-06 DIAGNOSIS — M25461 Effusion, right knee: Secondary | ICD-10-CM

## 2017-03-06 DIAGNOSIS — E11622 Type 2 diabetes mellitus with other skin ulcer: Secondary | ICD-10-CM | POA: Diagnosis present

## 2017-03-06 DIAGNOSIS — Z7984 Long term (current) use of oral hypoglycemic drugs: Secondary | ICD-10-CM

## 2017-03-06 DIAGNOSIS — IMO0001 Reserved for inherently not codable concepts without codable children: Secondary | ICD-10-CM

## 2017-03-06 DIAGNOSIS — E1122 Type 2 diabetes mellitus with diabetic chronic kidney disease: Secondary | ICD-10-CM | POA: Diagnosis present

## 2017-03-06 DIAGNOSIS — M1120 Other chondrocalcinosis, unspecified site: Secondary | ICD-10-CM

## 2017-03-06 DIAGNOSIS — M069 Rheumatoid arthritis, unspecified: Secondary | ICD-10-CM

## 2017-03-06 DIAGNOSIS — D649 Anemia, unspecified: Secondary | ICD-10-CM | POA: Diagnosis present

## 2017-03-06 DIAGNOSIS — L98492 Non-pressure chronic ulcer of skin of other sites with fat layer exposed: Secondary | ICD-10-CM

## 2017-03-06 DIAGNOSIS — R7989 Other specified abnormal findings of blood chemistry: Secondary | ICD-10-CM | POA: Diagnosis present

## 2017-03-06 DIAGNOSIS — I5032 Chronic diastolic (congestive) heart failure: Secondary | ICD-10-CM | POA: Diagnosis present

## 2017-03-06 DIAGNOSIS — R778 Other specified abnormalities of plasma proteins: Secondary | ICD-10-CM | POA: Diagnosis present

## 2017-03-06 DIAGNOSIS — L97929 Non-pressure chronic ulcer of unspecified part of left lower leg with unspecified severity: Secondary | ICD-10-CM | POA: Diagnosis present

## 2017-03-06 DIAGNOSIS — I10 Essential (primary) hypertension: Secondary | ICD-10-CM | POA: Diagnosis not present

## 2017-03-06 DIAGNOSIS — L089 Local infection of the skin and subcutaneous tissue, unspecified: Secondary | ICD-10-CM

## 2017-03-06 DIAGNOSIS — I5033 Acute on chronic diastolic (congestive) heart failure: Secondary | ICD-10-CM | POA: Diagnosis present

## 2017-03-06 DIAGNOSIS — E871 Hypo-osmolality and hyponatremia: Secondary | ICD-10-CM | POA: Diagnosis present

## 2017-03-06 DIAGNOSIS — Z6841 Body Mass Index (BMI) 40.0 and over, adult: Secondary | ICD-10-CM | POA: Diagnosis not present

## 2017-03-06 DIAGNOSIS — E861 Hypovolemia: Secondary | ICD-10-CM | POA: Diagnosis present

## 2017-03-06 DIAGNOSIS — M109 Gout, unspecified: Principal | ICD-10-CM | POA: Diagnosis present

## 2017-03-06 DIAGNOSIS — E860 Dehydration: Secondary | ICD-10-CM | POA: Diagnosis present

## 2017-03-06 DIAGNOSIS — E1165 Type 2 diabetes mellitus with hyperglycemia: Secondary | ICD-10-CM | POA: Diagnosis present

## 2017-03-06 DIAGNOSIS — N179 Acute kidney failure, unspecified: Secondary | ICD-10-CM

## 2017-03-06 DIAGNOSIS — R748 Abnormal levels of other serum enzymes: Secondary | ICD-10-CM

## 2017-03-06 DIAGNOSIS — L03116 Cellulitis of left lower limb: Secondary | ICD-10-CM | POA: Diagnosis present

## 2017-03-06 DIAGNOSIS — I130 Hypertensive heart and chronic kidney disease with heart failure and stage 1 through stage 4 chronic kidney disease, or unspecified chronic kidney disease: Secondary | ICD-10-CM | POA: Diagnosis present

## 2017-03-06 DIAGNOSIS — Z794 Long term (current) use of insulin: Secondary | ICD-10-CM | POA: Diagnosis not present

## 2017-03-06 DIAGNOSIS — I9589 Other hypotension: Secondary | ICD-10-CM | POA: Diagnosis not present

## 2017-03-06 DIAGNOSIS — R0902 Hypoxemia: Secondary | ICD-10-CM | POA: Diagnosis present

## 2017-03-06 DIAGNOSIS — M2540 Effusion, unspecified joint: Secondary | ICD-10-CM | POA: Diagnosis not present

## 2017-03-06 DIAGNOSIS — E1129 Type 2 diabetes mellitus with other diabetic kidney complication: Secondary | ICD-10-CM | POA: Diagnosis not present

## 2017-03-06 DIAGNOSIS — Z7952 Long term (current) use of systemic steroids: Secondary | ICD-10-CM

## 2017-03-06 DIAGNOSIS — I4510 Unspecified right bundle-branch block: Secondary | ICD-10-CM | POA: Diagnosis present

## 2017-03-06 DIAGNOSIS — I480 Paroxysmal atrial fibrillation: Secondary | ICD-10-CM | POA: Diagnosis present

## 2017-03-06 DIAGNOSIS — N184 Chronic kidney disease, stage 4 (severe): Secondary | ICD-10-CM | POA: Diagnosis present

## 2017-03-06 DIAGNOSIS — Z79899 Other long term (current) drug therapy: Secondary | ICD-10-CM

## 2017-03-06 LAB — CBC WITH DIFFERENTIAL/PLATELET
BASOS ABS: 0 10*3/uL (ref 0.0–0.1)
BASOS PCT: 0 %
EOS PCT: 0 %
Eosinophils Absolute: 0 10*3/uL (ref 0.0–0.7)
HCT: 32.4 % — ABNORMAL LOW (ref 39.0–52.0)
Hemoglobin: 10.7 g/dL — ABNORMAL LOW (ref 13.0–17.0)
LYMPHS PCT: 9 %
Lymphs Abs: 1.7 10*3/uL (ref 0.7–4.0)
MCH: 29.3 pg (ref 26.0–34.0)
MCHC: 33 g/dL (ref 30.0–36.0)
MCV: 88.8 fL (ref 78.0–100.0)
MONO ABS: 0.7 10*3/uL (ref 0.1–1.0)
Monocytes Relative: 4 %
Neutro Abs: 16 10*3/uL (ref 1.7–7.7)
Neutrophils Relative %: 87 %
PLATELETS: 266 10*3/uL (ref 150–400)
RBC: 3.65 MIL/uL — AB (ref 4.22–5.81)
RDW: 16.5 % — AB (ref 11.5–15.5)
WBC: 18.5 10*3/uL — AB (ref 4.0–10.5)

## 2017-03-06 LAB — I-STAT TROPONIN, ED: Troponin i, poc: 0.17 ng/mL (ref 0.00–0.08)

## 2017-03-06 LAB — GLUCOSE, CAPILLARY: Glucose-Capillary: 215 mg/dL — ABNORMAL HIGH (ref 65–99)

## 2017-03-06 LAB — TROPONIN I: Troponin I: 0.13 ng/mL (ref <0.03)

## 2017-03-06 LAB — URINALYSIS, ROUTINE W REFLEX MICROSCOPIC
BILIRUBIN URINE: NEGATIVE
GLUCOSE, UA: NEGATIVE mg/dL
Hgb urine dipstick: NEGATIVE
KETONES UR: NEGATIVE mg/dL
LEUKOCYTES UA: NEGATIVE
NITRITE: NEGATIVE
Protein, ur: 30 mg/dL — AB
SPECIFIC GRAVITY, URINE: 1.015 (ref 1.005–1.030)
pH: 5 (ref 5.0–8.0)

## 2017-03-06 LAB — GRAM STAIN

## 2017-03-06 LAB — SODIUM, URINE, RANDOM

## 2017-03-06 LAB — COMPREHENSIVE METABOLIC PANEL
ALBUMIN: 2.7 g/dL — AB (ref 3.5–5.0)
ALK PHOS: 78 U/L (ref 38–126)
ALT: 9 U/L — ABNORMAL LOW (ref 17–63)
AST: 15 U/L (ref 15–41)
Anion gap: 15 (ref 5–15)
BILIRUBIN TOTAL: 1.5 mg/dL — AB (ref 0.3–1.2)
BUN: 87 mg/dL — AB (ref 6–20)
CALCIUM: 8.7 mg/dL — AB (ref 8.9–10.3)
CO2: 26 mmol/L (ref 22–32)
CREATININE: 4.34 mg/dL — AB (ref 0.61–1.24)
Chloride: 85 mmol/L — ABNORMAL LOW (ref 101–111)
GFR calc Af Amer: 18 mL/min — ABNORMAL LOW (ref >60)
GFR, EST NON AFRICAN AMERICAN: 15 mL/min — AB (ref >60)
GLUCOSE: 244 mg/dL — AB (ref 65–99)
POTASSIUM: 4.9 mmol/L (ref 3.5–5.1)
Sodium: 126 mmol/L — ABNORMAL LOW (ref 135–145)
TOTAL PROTEIN: 7.2 g/dL (ref 6.5–8.1)

## 2017-03-06 LAB — SYNOVIAL CELL COUNT + DIFF, W/ CRYSTALS
Eosinophils-Synovial: 0 % (ref 0–1)
Lymphocytes-Synovial Fld: 0 % (ref 0–20)
Monocyte-Macrophage-Synovial Fluid: 14 % — ABNORMAL LOW (ref 50–90)
Neutrophil, Synovial: 86 % — ABNORMAL HIGH (ref 0–25)
Other Cells-SYN: 0
WBC, SYNOVIAL: 39000 /mm3 — AB (ref 0–200)

## 2017-03-06 LAB — SEDIMENTATION RATE: SED RATE: 82 mm/h — AB (ref 0–16)

## 2017-03-06 LAB — I-STAT CHEM 8, ED
BUN: 79 mg/dL — AB (ref 6–20)
Calcium, Ion: 1.04 mmol/L — ABNORMAL LOW (ref 1.15–1.40)
Chloride: 86 mmol/L — ABNORMAL LOW (ref 101–111)
Creatinine, Ser: 4.1 mg/dL — ABNORMAL HIGH (ref 0.61–1.24)
Glucose, Bld: 243 mg/dL — ABNORMAL HIGH (ref 65–99)
HEMATOCRIT: 36 % — AB (ref 39.0–52.0)
HEMOGLOBIN: 12.2 g/dL — AB (ref 13.0–17.0)
POTASSIUM: 5 mmol/L (ref 3.5–5.1)
SODIUM: 127 mmol/L — AB (ref 135–145)
TCO2: 28 mmol/L (ref 22–32)

## 2017-03-06 LAB — BRAIN NATRIURETIC PEPTIDE: B Natriuretic Peptide: 791.5 pg/mL — ABNORMAL HIGH (ref 0.0–100.0)

## 2017-03-06 LAB — CREATININE, URINE, RANDOM: CREATININE, URINE: 177.25 mg/dL

## 2017-03-06 LAB — HEMOGLOBIN A1C
HEMOGLOBIN A1C: 9.2 % — AB (ref 4.8–5.6)
MEAN PLASMA GLUCOSE: 217.34 mg/dL

## 2017-03-06 LAB — C-REACTIVE PROTEIN: CRP: 32.5 mg/dL — AB (ref <1.0)

## 2017-03-06 LAB — PREALBUMIN: PREALBUMIN: 10.4 mg/dL — AB (ref 18–38)

## 2017-03-06 LAB — I-STAT CG4 LACTIC ACID, ED: LACTIC ACID, VENOUS: 2.56 mmol/L — AB (ref 0.5–1.9)

## 2017-03-06 MED ORDER — ADULT MULTIVITAMIN W/MINERALS CH
2.0000 | ORAL_TABLET | Freq: Every day | ORAL | Status: DC
  Administered 2017-03-07 – 2017-03-09 (×3): 2 via ORAL
  Filled 2017-03-06 (×3): qty 2

## 2017-03-06 MED ORDER — SODIUM CHLORIDE 0.9% FLUSH: 3.0000 mL | INTRAVENOUS | Status: DC | PRN

## 2017-03-06 MED ORDER — PIPERACILLIN-TAZOBACTAM 3.375 G IVPB 30 MIN
3.3750 g | Freq: Once | INTRAVENOUS | Status: AC
  Administered 2017-03-06: 3.375 g via INTRAVENOUS
  Filled 2017-03-06: qty 50

## 2017-03-06 MED ORDER — GABAPENTIN 300 MG PO CAPS
300.0000 mg | ORAL_CAPSULE | Freq: Three times a day (TID) | ORAL | Status: DC
  Administered 2017-03-06 – 2017-03-09 (×8): 300 mg via ORAL
  Filled 2017-03-06 (×8): qty 1

## 2017-03-06 MED ORDER — BENZONATATE 100 MG PO CAPS: 100.0000 mg | ORAL_CAPSULE | Freq: Three times a day (TID) | ORAL | Status: DC | PRN

## 2017-03-06 MED ORDER — ONDANSETRON HCL 4 MG/2ML IJ SOLN: 4.0000 mg | Freq: Four times a day (QID) | INTRAMUSCULAR | Status: DC | PRN

## 2017-03-06 MED ORDER — ONDANSETRON HCL 4 MG PO TABS: 4.0000 mg | ORAL_TABLET | Freq: Four times a day (QID) | ORAL | Status: DC | PRN

## 2017-03-06 MED ORDER — SODIUM CHLORIDE 0.9 % IV SOLN: 250.0000 mL | INTRAVENOUS | Status: DC | PRN

## 2017-03-06 MED ORDER — CARVEDILOL 25 MG PO TABS
37.5000 mg | ORAL_TABLET | Freq: Two times a day (BID) | ORAL | Status: DC
  Administered 2017-03-07 – 2017-03-09 (×4): 37.5 mg via ORAL
  Filled 2017-03-06: qty 1
  Filled 2017-03-06 (×2): qty 3
  Filled 2017-03-06: qty 1

## 2017-03-06 MED ORDER — CEFTRIAXONE SODIUM 2 G IJ SOLR
2.0000 g | INTRAMUSCULAR | Status: DC
  Administered 2017-03-06 – 2017-03-07 (×2): 2 g via INTRAVENOUS
  Filled 2017-03-06 (×2): qty 2

## 2017-03-06 MED ORDER — SODIUM CHLORIDE 0.9 % IV BOLUS (SEPSIS)
1000.0000 mL | Freq: Once | INTRAVENOUS | Status: AC
  Administered 2017-03-06: 1000 mL via INTRAVENOUS

## 2017-03-06 MED ORDER — DILTIAZEM HCL ER COATED BEADS 180 MG PO CP24
360.0000 mg | ORAL_CAPSULE | Freq: Every day | ORAL | Status: DC
  Administered 2017-03-07 – 2017-03-09 (×3): 360 mg via ORAL
  Filled 2017-03-06: qty 1
  Filled 2017-03-06 (×2): qty 2
  Filled 2017-03-06: qty 1

## 2017-03-06 MED ORDER — FLUTICASONE PROPIONATE 50 MCG/ACT NA SUSP
2.0000 | Freq: Every day | NASAL | Status: DC
  Administered 2017-03-08 – 2017-03-09 (×2): 2 via NASAL
  Filled 2017-03-06: qty 16

## 2017-03-06 MED ORDER — RIVAROXABAN 15 MG PO TABS
15.0000 mg | ORAL_TABLET | Freq: Every day | ORAL | Status: DC
  Administered 2017-03-07 – 2017-03-08 (×3): 15 mg via ORAL
  Filled 2017-03-06 (×3): qty 1

## 2017-03-06 MED ORDER — VANCOMYCIN HCL 10 G IV SOLR
2500.0000 mg | Freq: Once | INTRAVENOUS | Status: AC
  Administered 2017-03-06: 2500 mg via INTRAVENOUS
  Filled 2017-03-06: qty 2500

## 2017-03-06 MED ORDER — METRONIDAZOLE IN NACL 5-0.79 MG/ML-% IV SOLN
500.0000 mg | Freq: Three times a day (TID) | INTRAVENOUS | Status: DC
  Administered 2017-03-06 – 2017-03-08 (×6): 500 mg via INTRAVENOUS
  Filled 2017-03-06 (×6): qty 100

## 2017-03-06 MED ORDER — LIDOCAINE-EPINEPHRINE (PF) 2 %-1:200000 IJ SOLN
20.0000 mL | Freq: Once | INTRAMUSCULAR | Status: AC
  Administered 2017-03-06: 20 mL via INTRADERMAL
  Filled 2017-03-06: qty 20

## 2017-03-06 MED ORDER — INSULIN GLARGINE 100 UNIT/ML ~~LOC~~ SOLN
15.0000 [IU] | Freq: Every day | SUBCUTANEOUS | Status: DC
  Administered 2017-03-06 – 2017-03-07 (×2): 15 [IU] via SUBCUTANEOUS
  Filled 2017-03-06 (×2): qty 0.15

## 2017-03-06 MED ORDER — SENNOSIDES-DOCUSATE SODIUM 8.6-50 MG PO TABS: 1.0000 | ORAL_TABLET | Freq: Every evening | ORAL | Status: DC | PRN

## 2017-03-06 MED ORDER — ACETAMINOPHEN-CODEINE #3 300-30 MG PO TABS
1.0000 | ORAL_TABLET | Freq: Four times a day (QID) | ORAL | Status: DC | PRN
  Administered 2017-03-07: 1 via ORAL
  Filled 2017-03-06: qty 2

## 2017-03-06 MED ORDER — PREDNISONE 20 MG PO TABS
10.0000 mg | ORAL_TABLET | Freq: Every day | ORAL | Status: DC
Start: 2017-03-07 — End: 2017-03-06

## 2017-03-06 MED ORDER — VANCOMYCIN HCL 10 G IV SOLR
1750.0000 mg | INTRAVENOUS | Status: DC
  Filled 2017-03-06: qty 1750

## 2017-03-06 MED ORDER — INSULIN ASPART 100 UNIT/ML ~~LOC~~ SOLN
0.0000 [IU] | Freq: Three times a day (TID) | SUBCUTANEOUS | Status: DC
  Administered 2017-03-07: 8 [IU] via SUBCUTANEOUS
  Administered 2017-03-07: 2 [IU] via SUBCUTANEOUS
  Administered 2017-03-07: 3 [IU] via SUBCUTANEOUS
  Administered 2017-03-08 (×3): 15 [IU] via SUBCUTANEOUS

## 2017-03-06 MED ORDER — ACETAMINOPHEN 325 MG PO TABS: 650.0000 mg | ORAL_TABLET | Freq: Four times a day (QID) | ORAL | Status: DC | PRN

## 2017-03-06 MED ORDER — SODIUM CHLORIDE 0.9% FLUSH
3.0000 mL | Freq: Two times a day (BID) | INTRAVENOUS | Status: DC
  Administered 2017-03-07 – 2017-03-08 (×3): 3 mL via INTRAVENOUS

## 2017-03-06 MED ORDER — ALBUTEROL SULFATE (2.5 MG/3ML) 0.083% IN NEBU: 2.5000 mg | INHALATION_SOLUTION | Freq: Four times a day (QID) | RESPIRATORY_TRACT | Status: DC | PRN

## 2017-03-06 MED ORDER — SODIUM CHLORIDE 0.9% FLUSH
3.0000 mL | Freq: Two times a day (BID) | INTRAVENOUS | Status: DC
  Administered 2017-03-08 (×2): 3 mL via INTRAVENOUS

## 2017-03-06 MED ORDER — ACETAMINOPHEN 650 MG RE SUPP: 650.0000 mg | Freq: Four times a day (QID) | RECTAL | Status: DC | PRN

## 2017-03-06 MED ORDER — INSULIN ASPART 100 UNIT/ML ~~LOC~~ SOLN
0.0000 [IU] | Freq: Every day | SUBCUTANEOUS | Status: DC
  Administered 2017-03-06: 2 [IU] via SUBCUTANEOUS
  Administered 2017-03-07: 4 [IU] via SUBCUTANEOUS

## 2017-03-06 NOTE — ED Triage Notes (Signed)
Pt arrives via EMS from home due to increased generalized pain and edema. Pt tried to come to ED POV but got too weak. A&Ox4. EMS vitals 80/60. Hx of HTN, DM, Afib, CHF

## 2017-03-06 NOTE — H&P (Signed)
History and Physical    John Bailey ZDG:[id number] DOB: 1970/08/28 DOA: 03/06/2017  PCP: [name], MD   Patient coming from: Home  Chief Complaint: Generalized weakness, fatigue, loss of appetite   HPI: John Bailey is a medically complex and chronically ill 46 y.o. male with medical history significant for chronic kidney disease stage IV, atrial fibrillation on Xarelto, chronic diastolic CHF, insulin-dependent diabetes mellitus, hypertension, and rheumatoid arthritis, now presenting to the emergency department for 3 days of generalized weakness and malaise.  Patient reports that he had been in his usual state until approximately 3 weeks ago when he noted the insidious development of generalized weakness and general malaise.  He denies any fevers or chills, denies cough or shortness of breath, and denies chest pain or palpitations.  He has chronic bilateral lower extremity wounds.  Reports that his edema has worsened since his hospitalization in October 2018 for acute on chronic CHF.  Reports that he has gained weight since that time.  He had been taking prednisone for rheumatoid arthritis, but not recently.  ED Course: Upon arrival to the ED, patient is found to be afebrile, saturating mid 90s on 2 L/min of supplemental oxygen, blood pressure in the 90/65 range.  EKG features atrial fibrillation with PVC and RBBB.  Chest x-ray is notable for stable marked cardiomegaly and chronic bronchitic changes.  Radiographs of the left lower leg are notable for soft tissue wound at the lateral aspect without underlying emphysema or bony abnormality.  Chemistry panel reveals a sodium of 126, BUN 87, creatinine 4.34, up from 3.15 in October 2018, and glucose 244.  CBC features a chronic leukocytosis with WBC 18,500, and a chronic normocytic anemia with hemoglobin of 10.7.  Lactic acid is elevated to 2.56, troponin is elevated to 0.17, and BNP is elevated to 792.  Blood cultures were collected,  patient was treated with 2 L of normal saline, Zosyn for suspected left lower extremity wound infection.  Knee swelling and pain was reported and bedside arthrocentesis is being performed by the ED physician with plan to send fluid for Gram stain, crystal analysis, and culture.  Blood pressure remained stable, patient is not in any acute distress, and he will be admitted to the stepdown unit for ongoing evaluation and management generalized weakness, malaise, increased edema and weight gain with acute renal failure superimposed on CKD 4 and concern for infected left lower leg ulcer.  Review of Systems:  All other systems reviewed and apart from HPI, are negative.  Past Medical History:  Diagnosis Date  . Atrial flutter with rapid ventricular response ([place]) 08/06/2015   a. s/p DCCV in 07/2015 with recurrent atrial fibrillation and DCCV in 12/2016. Initially successful but noted to be back in atrial fibrillation within 2 weeks of DCCV.   [name] Chronic diastolic (congestive) heart failure ([facility])    a. EF 50-55% by echo in 06/2016.  . Diabetes ([place])   . Hypertension     Past Surgical History:  Procedure Laterality Date  . CARDIOVERSION N/A 08/01/2015   Procedure: CARDIOVERSION;  Surgeon: Josue Hector, MD;  Location: [facility] ENDOSCOPY;  Service: Cardiovascular;  Laterality: N/A;  . CARDIOVERSION N/A 12/18/2016   Procedure: CARDIOVERSION;  Surgeon: [name], MD;  Location: [place];  Service: Cardiovascular;  Laterality: N/A;  . CHOLECYSTECTOMY    . TEE WITHOUT CARDIOVERSION N/A 08/01/2015   Procedure: TRANSESOPHAGEAL ECHOCARDIOGRAM (TEE);  Surgeon: Josue Hector, MD;  Location: [place];  Service: Cardiovascular;  Laterality: N/A;  reports that  has never smoked. he has never used smokeless tobacco. He reports that he does not drink alcohol or use drugs.  No Known Allergies  Family History  Problem Relation Age of Onset  . Heart disease Mother   . Hyperlipidemia Father   .  Hypertension Father      Prior to Admission medications   Medication Sig Start Date End Date Taking? Authorizing Provider  acetaminophen-codeine (TYLENOL #3) 300-30 MG tablet Take 1 tablet by mouth every 8 (eight) hours as needed for moderate pain or severe pain.  11/26/16   [provider]  albuterol (PROVENTIL HFA;VENTOLIN HFA) 108 (90 BASE) MCG/ACT inhaler Inhale 1-2 puffs into the lungs every 6 (six) hours as needed for wheezing. 05/30/14   Nahser, Wonda Cheng, MD  benzonatate (TESSALON) 100 MG capsule Take 1 capsule (100 mg total) by mouth 3 (three) times daily as needed for cough. 12/16/16   Barton Dubois, MD  carvedilol (COREG) 12.5 MG tablet Take 3 tablets (37.5 mg total) by mouth 2 (two) times daily with a meal. 12/16/16   Barton Dubois, MD  colchicine 0.6 MG tablet Take 0.5 tablets (0.3 mg total) by mouth daily. 12/16/16   Barton Dubois, MD  diltiazem (CARDIZEM CD) 360 MG 24 hr capsule Take 1 capsule (360 mg total) by mouth daily. 12/29/16   Strader, Fransisco Hertz, PA-C  fluticasone (FLONASE) 50 MCG/ACT nasal spray Place 2 sprays into the nose daily.  12/29/12   [provider]  furosemide (LASIX) 80 MG tablet Take 2 tablets (160 mg total) by mouth 2 (two) times daily. 12/16/16   Barton Dubois, MD  gabapentin (NEURONTIN) 300 MG capsule Take 1 capsule by mouth 3 (three) times daily. 09/09/15   [provider]  insulin aspart (NOVOLOG FLEXPEN) 100 UNIT/ML FlexPen Inject 0-12 Units into the skin 3 (three) times daily as needed for high blood sugar. Sliding Scale <70, initiate hypoglycemia protocol, 70-100=0 units, 110-199=0 units, 200-250=4 units, 251-300=6 units, 301-350=8 units, 351-400=10 units, >400=12 units. Call MD if >450    [provider]  insulin aspart (NOVOLOG PENFILL) cartridge Inject 5 Units into the skin 3 (three) times daily with meals. 08/08/15   Charlynne Cousins, MD  Insulin Glargine (LANTUS) 100 UNIT/ML Solostar Pen Inject 15 Units into the  skin daily at 10 pm. 12/16/16   Barton Dubois, MD  Insulin Pen Needle 31G X 6 MM MISC 1 Device by Does not apply route 2 (two) times daily. 08/08/15   Charlynne Cousins, MD  Multiple Vitamin (MULTIVITAMIN WITH MINERALS) TABS tablet Take 2 tablets by mouth daily.    [provider]  potassium chloride SA (KLOR-CON M20) 20 MEQ tablet Take 1 tablet (20 mEq total) by mouth daily. 08/19/15   Hilty, Nadean Corwin, MD  predniSONE (DELTASONE) 5 MG tablet Take 10 mg by mouth daily with breakfast.  11/13/16   [provider]  rivaroxaban (XARELTO) 20 MG TABS tablet take 1 tablet by mouth once daily WITH SUPPER 10/12/16   Pixie Casino, MD    Physical Exam: Vitals:   03/06/17 1959 03/06/17 2000 03/06/17 2002 03/06/17 2015  BP:   101/77 110/69  Pulse:   78 81  Resp:   (!) 23 (!) 21  Temp: 98.7 F (37.1 C)     TempSrc: Oral     SpO2:   100% 97%  Weight:  136.1 kg (300 lb)    Height:  5\' 11"  (1.803 m)  Constitutional: NAD, calm, obese, listless Eyes: PERTLA, lids and conjunctivae normal ENMT: Mucous membranes are moist. Posterior pharynx clear of any exudate or lesions.   Neck: normal, supple, no masses, no thyromegaly Respiratory: No wheezing, no crackles or rhonchi appreciated though exam limited by pt body habitus. Normal respiratory effort. Using 2 Lpm suppelemental O2.  Cardiovascular: Rate ~80 and irregular. PMI displaced laterally. JVP not well-visualized. Abdomen: No distension, no tenderness. Bowel sounds normal.  Musculoskeletal: no clubbing / cyanosis. Right knee effusion and tenderness.   Skin: no significant rashes, lesions, ulcers. Warm, dry, well-perfused. Neurologic: CN 2-12 grossly intact. Sensation intact. Strength 5/5 in all 4 limbs.  Psychiatric: Alert and oriented x 3. Calm and cooperative.     Labs on Admission: I have personally reviewed following labs and imaging studies  CBC: Recent Labs  Lab 03/06/17 1800 03/06/17 1821  WBC 18.5*  --     NEUTROABS 16.0  --   HGB 10.7* 12.2*  HCT 32.4* 36.0*  MCV 88.8  --   PLT 266  --    Basic Metabolic Panel: Recent Labs  Lab 03/06/17 1800 03/06/17 1821  NA 126* 127*  K 4.9 5.0  CL 85* 86*  CO2 26  --   GLUCOSE 244* 243*  BUN 87* 79*  CREATININE 4.34* 4.10*  CALCIUM 8.7*  --    GFR: Estimated Creatinine Clearance: 32.4 mL/min (A) (by C-G formula based on SCr of 4.1 mg/dL (H)). Liver Function Tests: Recent Labs  Lab 03/06/17 1800  AST 15  ALT 9*  ALKPHOS 78  BILITOT 1.5*  PROT 7.2  ALBUMIN 2.7*   No results for input(s): LIPASE, AMYLASE in the last 168 hours. No results for input(s): AMMONIA in the last 168 hours. Coagulation Profile: No results for input(s): INR, PROTIME in the last 168 hours. Cardiac Enzymes: No results for input(s): CKTOTAL, CKMB, CKMBINDEX, TROPONINI in the last 168 hours. BNP (last 3 results) No results for input(s): PROBNP in the last 8760 hours. HbA1C: No results for input(s): HGBA1C in the last 72 hours. CBG: No results for input(s): GLUCAP in the last 168 hours. Lipid Profile: No results for input(s): CHOL, HDL, LDLCALC, TRIG, CHOLHDL, LDLDIRECT in the last 72 hours. Thyroid Function Tests: No results for input(s): TSH, T4TOTAL, FREET4, T3FREE, THYROIDAB in the last 72 hours. Anemia Panel: No results for input(s): VITAMINB12, FOLATE, FERRITIN, TIBC, IRON, RETICCTPCT in the last 72 hours. Urine analysis:    Component Value Date/Time   COLORURINE YELLOW 12/02/2016 1518   APPEARANCEUR CLEAR 12/02/2016 1518   LABSPEC 1.008 12/02/2016 1518   PHURINE 5.0 12/02/2016 1518   GLUCOSEU NEGATIVE 12/02/2016 1518   HGBUR NEGATIVE 12/02/2016 1518   BILIRUBINUR NEGATIVE 12/02/2016 1518   KETONESUR NEGATIVE 12/02/2016 1518   PROTEINUR 30 (A) 12/02/2016 1518   NITRITE NEGATIVE 12/02/2016 1518   LEUKOCYTESUR NEGATIVE 12/02/2016 1518   Sepsis Labs: @LABRCNTIP (procalcitonin:4,lacticidven:4) )No results found for this or any previous visit  (from the past 240 hour(s)).   Radiological Exams on Admission: Dg Chest 2 View  Result Date: 03/06/2017 CLINICAL DATA:  46 year old male: Code sepsis EXAM: CHEST  2 VIEW COMPARISON:  Prior chest x-ray 12/01/2016 FINDINGS: Similar appearance of marked cardiomegaly. No focal airspace consolidation to suggest a bacterial pneumonia. Streaky airspace opacities in the lung bases are slightly more prominent than prior but favored to reflect atelectasis and chronic bronchitis. No pleural effusion or pneumothorax. No acute osseous abnormality. IMPRESSION: 1. Stable marked cardiomegaly, chronic bronchitic changes and bibasilar atelectasis. Electronically Signed  By: Jacqulynn Cadet M.D.   On: 03/06/2017 18:51   Dg Tibia/fibula Left  Result Date: 03/06/2017 CLINICAL DATA:  Initial evaluation for soft tissue wound to left leg. Hypotension. EXAM: LEFT TIBIA AND FIBULA - 2 VIEW COMPARISON:  None. FINDINGS: No acute fracture or dislocation. Limited views of the knee and ankle are grossly unremarkable. Soft tissue wound present at the lateral aspect of the mid/lower left leg. No soft tissue emphysema radiopaque foreign body. No other soft tissue abnormality. IMPRESSION: 1. Soft tissue wound at the lateral aspect of the left lower leg. No associated emphysema or other abnormality. 2. No acute osseous abnormality about the left tibia/fibula. No evidence for acute osteomyelitis. Electronically Signed   By: Jeannine Boga M.D.   On: 03/06/2017 18:54    EKG: Independently reviewed. Atrial fibrillation, PVC, RBBB.   Assessment/Plan  1. Acute kidney injury superimposed on CKD stage IV  - SCr is 4.34 on admission, up from 3.15 in October 2018  - Fluid-status difficult to determine clinically d/t pt body habitus; wt is up 10 lbs since recent discharge after treatment for acute CHF and pt reports peripheral edema has increased  - He was treated in ED with 2 liters NS  - Check urine sodium, urea, and  creatinine; hold Lasix for now while following strict I/O's, daily wts; repeat chem panel in am    2. 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 Acute on chronic diastolic CHF  - Pt has EF 50-55% on echo from April '18 with severe LVH, apical HK, and grade 3 diastolic dysfunction  - He was admitted in October 2018 with acute CHF, diuresed, and discharged at 290 lbs with dry wt suspected to be ~270 - He now presents with wt 300 and reports increased edema; no respiratory distress though he is now on supplemental O2  - He was given 2 liters of NS in ED but may need to be diuresed; body habitus makes fluid-status more difficult to determine  - SLIV, follow strict I/O's and daily wts, continue Coreg    4. Paroxysmal atrial fibrillation  - In rate-controlled a fib on admission  - CHADS-VASc is at least 3 (CHF, HTN, DM)  - Continue Xarelto as renal function allows, continue diltiazem    5. Hypotension; hx of hypertension  - SBP in low 90's on presentation, has remained stable in ED  - Put holding parameters on Coreg, follow fluid-status closely    6. Insulin-dependent DM  - A1c was 14.5% in September 2018  - Managed at home with Lantus 15 qHS and Novolog per sliding-scale  - Check CBG's, continue Lantus and SSI with Novolog   7. Hyponatremia  - Serum sodium 126 on admission  - Wt is up and pt reports increased edema suggesting hypervolemia, though BP was low and AKI noted on arrival raising concern for hypovolemia and 2 liters NS given  - Check urine chemistries, follow fluid-status closely, SLIV for now    8. Elevated  troponin - Troponin is elevated to 0.17 on admission without chest pain or SOB  - Continue cardiac monitoring, repeat troponin for trend,  continue beta-blocker    9. Normocytic anemia  - Hgb is 10.7 on admission, slightly lower than priors without bleeding  - Likely secondary to chronic disease/CKD   10. Right knee pain and swelling  - Status-post arthrocentesis in ED  - Follow-up fluid studies and culture, continue abx as above    DVT prophylaxis: Xarelto  Code Status: Full  Family Communication: Wife updated at bedside Disposition Plan: Admit to SDU Consults called: None Admission status: Inpatient    Vianne Bulls, MD [facility] Pager [phone]  If 7PM-7AM, please contact night-coverage [email or web address] Password [facility]  03/06/2017, 8:27 PM

## 2017-03-06 NOTE — Progress Notes (Signed)
Pharmacy Antibiotic Note  John Bailey is a 46 y.o. male admitted on 03/06/2017 with cellulitis.  Pharmacy has been consulted for vancomycin dosing. Also on ceftriaxone and Flagyl per MD. Afebrile, WBC 18.5, SCr 4.1, normalized CrCl~23.  Plan: Continue ceftriaxone 2g IV q24h and Flagyl 500mg  IV q8h Vancomycin 2500mg  IV x 1 already given in ED; then 1750mg  IV q48h Monitor clinical progress, c/s, renal function F/u de-escalation plan/LOT, vancomycin trough as indicated   Height: 5\' 11"  (180.3 cm) Weight: 300 lb (136.1 kg) IBW/kg (Calculated) : 75.3  Temp (24hrs), Avg:98.7 F (37.1 C), Min:98.7 F (37.1 C), Max:98.7 F (37.1 C)  Recent Labs  Lab 03/06/17 1800 03/06/17 1820 03/06/17 1821  WBC 18.5*  --   --   CREATININE 4.34*  --  4.10*  LATICACIDVEN  --  2.56*  --     Estimated Creatinine Clearance: 32.4 mL/min (A) (by C-G formula based on SCr of 4.1 mg/dL (H)).    No Known Allergies  Elicia Lamp, PharmD, BCPS Clinical Pharmacist 03/06/2017 8:43 PM

## 2017-03-06 NOTE — ED Notes (Signed)
Delay in lab draw,  Pt not in room 

## 2017-03-06 NOTE — ED Notes (Signed)
RN notified of Critical Lab Results

## 2017-03-06 NOTE — ED Notes (Signed)
Patient transported to X-ray 

## 2017-03-06 NOTE — ED Provider Notes (Signed)
[facility] EMERGENCY DEPARTMENT Provider Note   CSN: [id number] Arrival date & time: 03/06/17  1742     History   Chief Complaint Chief Complaint  Patient presents with  . Hypotension    HPI John Bailey is a 46 y.o. male.  46 yo M with a chief complaint of weakness.  Going on for the past 3 days.  Patient denies cough congestion fevers or chills denies vomiting or diarrhea.  He has not felt like eating or drinking and so has not done much of either for the past couple days.  He has chronic wounds to his legs which are being seen by the wound center.  Last wrapped 2 days ago they said they were looking better at that time.  He denies chest pain or shortness of breath.  He feels that he is diffusely edematous.  There is a chronic issue but mildly worse for him.   The history is provided by the patient.  Illness  This is a new problem. The current episode started more than 2 days ago. The problem occurs constantly. The problem has been gradually worsening. Pertinent negatives include no chest pain, no abdominal pain, no headaches and no shortness of breath. Nothing aggravates the symptoms. Nothing relieves the symptoms. He has tried nothing for the symptoms. The treatment provided no relief.    Past Medical History:  Diagnosis Date  . Atrial flutter with rapid ventricular response ([place]) 08/06/2015   a. s/p DCCV in 07/2015 with recurrent atrial fibrillation and DCCV in 12/2016. Initially successful but noted to be back in atrial fibrillation within 2 weeks of DCCV.   [name] Chronic diastolic (congestive) heart failure (HCC)    a. EF 50-55% by echo in 06/2016.  . Diabetes ([place])   . Hypertension     Patient Active Problem List   Diagnosis Date Noted  . CKD (chronic kidney disease), stage IV ([place]) 03/06/2017  . Normocytic anemia 03/06/2017  . Elevated troponin 03/06/2017  . Acute renal failure superimposed on stage 4 chronic kidney disease ([place]) 03/06/2017  .  Essential hypertension 12/29/2016  . Persistent atrial fibrillation ([place])   . Left leg cellulitis   . Atrial fibrillation with RVR ([place]) 12/02/2016  . Sepsis ([place]) 12/02/2016  . Acute on chronic congestive heart failure ([place])   . Acute on chronic renal insufficiency   . Acute on chronic diastolic CHF (congestive heart failure) ([place]) 03/20/2016  . Nonischemic cardiomyopathy ([place]) 11/11/2015  . Long term (current) use of anticoagulants [Z79.01] 08/14/2015  . Insulin-dependent diabetes mellitus with renal complications ([place]) 82/50/5397  . Atrial flutter with rapid ventricular response ([place]) 08/06/2015  . Hyponatremia 07/30/2015  . AKI (acute kidney injury) ([place]) 07/30/2015  . Hyperkalemia 07/30/2015  . Acute on chronic combined systolic and diastolic CHF (congestive heart failure) ([place]) 07/30/2015  . Morbid obesity ([place]) 07/30/2015  . Paroxysmal atrial fibrillation ([place]) 07/30/2015  . 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 Hypertensive heart disease with heart failure ([place]) 07/14/2012  . Dyspnea 07/14/2012  . Hyperlipidemia 07/14/2012    Past Surgical History:  Procedure Laterality Date  . CARDIOVERSION N/A 08/01/2015   Procedure: CARDIOVERSION;  Surgeon: Josue Hector, MD;  Location: [facility] ENDOSCOPY;  Service: Cardiovascular;  Laterality: N/A;  . CARDIOVERSION N/A 12/18/2016   Procedure: CARDIOVERSION;  Surgeon: [name], MD;  Location: [facility];  Service: Cardiovascular;  Laterality: N/A;  . CHOLECYSTECTOMY    . TEE WITHOUT CARDIOVERSION N/A 08/01/2015   Procedure:  TRANSESOPHAGEAL ECHOCARDIOGRAM (TEE);  Surgeon: Josue Hector, MD;  Location: [facility] ENDOSCOPY;  Service: Cardiovascular;  Laterality: N/A;       Home Medications    Prior to Admission medications   Medication Sig Start Date End Date Taking? Authorizing Provider  acetaminophen-codeine (TYLENOL #3) 300-30 MG tablet Take 1 tablet by mouth every 8 (eight) hours as needed for  moderate pain or severe pain.  11/26/16   [provider]  albuterol (PROVENTIL HFA;VENTOLIN HFA) 108 (90 BASE) MCG/ACT inhaler Inhale 1-2 puffs into the lungs every 6 (six) hours as needed for wheezing. 05/30/14   Nahser, Wonda Cheng, MD  benzonatate (TESSALON) 100 MG capsule Take 1 capsule (100 mg total) by mouth 3 (three) times daily as needed for cough. 12/16/16   Barton Dubois, MD  carvedilol (COREG) 12.5 MG tablet Take 3 tablets (37.5 mg total) by mouth 2 (two) times daily with a meal. 12/16/16   Barton Dubois, MD  colchicine 0.6 MG tablet Take 0.5 tablets (0.3 mg total) by mouth daily. 12/16/16   Barton Dubois, MD  diltiazem (CARDIZEM CD) 360 MG 24 hr capsule Take 1 capsule (360 mg total) by mouth daily. 12/29/16   Strader, Fransisco Hertz, PA-C  fluticasone (FLONASE) 50 MCG/ACT nasal spray Place 2 sprays into the nose daily.  12/29/12   [provider]  furosemide (LASIX) 80 MG tablet Take 2 tablets (160 mg total) by mouth 2 (two) times daily. 12/16/16   Barton Dubois, MD  gabapentin (NEURONTIN) 300 MG capsule Take 1 capsule by mouth 3 (three) times daily. 09/09/15   [provider]  insulin aspart (NOVOLOG FLEXPEN) 100 UNIT/ML FlexPen Inject 0-12 Units into the skin 3 (three) times daily as needed for high blood sugar. Sliding Scale <70, initiate hypoglycemia protocol, 70-100=0 units, 110-199=0 units, 200-250=4 units, 251-300=6 units, 301-350=8 units, 351-400=10 units, >400=12 units. Call MD if >450    [provider]  insulin aspart (NOVOLOG PENFILL) cartridge Inject 5 Units into the skin 3 (three) times daily with meals. 08/08/15   Charlynne Cousins, MD  Insulin Glargine (LANTUS) 100 UNIT/ML Solostar Pen Inject 15 Units into the skin daily at 10 pm. 12/16/16   Barton Dubois, MD  Insulin Pen Needle 31G X 6 MM MISC 1 Device by Does not apply route 2 (two) times daily. 08/08/15   Charlynne Cousins, MD  Multiple Vitamin (MULTIVITAMIN WITH MINERALS) TABS tablet Take 2  tablets by mouth daily.    [provider]  potassium chloride SA (KLOR-CON M20) 20 MEQ tablet Take 1 tablet (20 mEq total) by mouth daily. 08/19/15   Hilty, Nadean Corwin, MD  predniSONE (DELTASONE) 5 MG tablet Take 10 mg by mouth daily with breakfast.  11/13/16   [provider]  rivaroxaban (XARELTO) 20 MG TABS tablet take 1 tablet by mouth once daily WITH SUPPER 10/12/16   Hilty, Nadean Corwin, MD    Family History Family History  Problem Relation Age of Onset  . Heart disease Mother   . Hyperlipidemia Father   . Hypertension Father     Social History Social History   Tobacco Use  . Smoking status: Never Smoker  . Smokeless tobacco: Never Used  Substance Use Topics  . Alcohol use: No  . Drug use: No     Allergies   Patient has no known allergies.   Review of Systems Review of Systems  Constitutional: Positive for appetite change (loss of). Negative for chills and fever.  HENT: Negative for congestion and facial  swelling.   Eyes: Negative for discharge and visual disturbance.  Respiratory: Negative for shortness of breath.   Cardiovascular: Negative for chest pain and palpitations.  Gastrointestinal: Negative for abdominal pain, diarrhea and vomiting.  Musculoskeletal: Negative for arthralgias and myalgias.  Skin: Negative for color change and rash.  Neurological: Positive for weakness (generalized). Negative for tremors, syncope and headaches.  Psychiatric/Behavioral: Negative for confusion and dysphoric mood.     Physical Exam Updated Vital Signs BP 113/78   Pulse 79   Temp 98.7 F (37.1 C) (Oral)   Resp 18   Ht 5\' 11"  (1.803 m)   Wt 136.1 kg (300 lb)   SpO2 96%   BMI 41.84 kg/m   Physical Exam  Constitutional: He is oriented to person, place, and time. He appears well-developed and well-nourished.  HENT:  Head: Normocephalic and atraumatic.  Eyes: EOM are normal. Pupils are equal, round, and reactive to light.  Neck: Normal range of motion.  Neck supple. No JVD present.  Cardiovascular: Normal rate and regular rhythm. Exam reveals no gallop and no friction rub.  No murmur heard. Pulmonary/Chest: No respiratory distress. He has no wheezes.  Abdominal: He exhibits no distension and no mass. There is no tenderness. There is no rebound and no guarding.  Musculoskeletal: Normal range of motion. He exhibits edema (generalized trace to bilateral upper extremities).  Wounds to the lateral aspect of bilateral lower legs.  The left lower leg has purulent drainage that is foul-smelling.  It is tender to palpation.  There is no underlying erythema or induration.  R knee pain and swelling, large effusion, no noted erythema.  Mildly warm compared to the other side.   Neurological: He is alert and oriented to person, place, and time.  Skin: No rash noted. No pallor.  Psychiatric: He has a normal mood and affect. His behavior is normal.  Nursing note and vitals reviewed.    ED Treatments / Results  Labs (all labs ordered are listed, but only abnormal results are displayed) Labs Reviewed  COMPREHENSIVE METABOLIC PANEL - Abnormal; Notable for the following components:      Result Value   Sodium 126 (*)    Chloride 85 (*)    Glucose, Bld 244 (*)    BUN 87 (*)    Creatinine, Ser 4.34 (*)    Calcium 8.7 (*)    Albumin 2.7 (*)    ALT 9 (*)    Total Bilirubin 1.5 (*)    GFR calc non Af Amer 15 (*)    GFR calc Af Amer 18 (*)    All other components within normal limits  CBC WITH DIFFERENTIAL/PLATELET - Abnormal; Notable for the following components:   WBC 18.5 (*)    RBC 3.65 (*)    Hemoglobin 10.7 (*)    HCT 32.4 (*)    RDW 16.5 (*)    All other components within normal limits  BRAIN NATRIURETIC PEPTIDE - Abnormal; Notable for the following components:   B Natriuretic Peptide 791.5 (*)    All other components within normal limits  I-STAT CG4 LACTIC ACID, ED - Abnormal; Notable for the following components:   Lactic Acid, Venous  2.56 (*)    All other components within normal limits  I-STAT TROPONIN, ED - Abnormal; Notable for the following components:   Troponin i, poc 0.17 (*)    All other components within normal limits  I-STAT CHEM 8, ED - Abnormal; Notable for the following components:   Sodium 127 (*)  Chloride 86 (*)    BUN 79 (*)    Creatinine, Ser 4.10 (*)    Glucose, Bld 243 (*)    Calcium, Ion 1.04 (*)    Hemoglobin 12.2 (*)    HCT 36.0 (*)    All other components within normal limits  CULTURE, BLOOD (ROUTINE X 2)  CULTURE, BLOOD (ROUTINE X 2)  URINE CULTURE  BODY FLUID CULTURE  GRAM STAIN  URINALYSIS, ROUTINE W REFLEX MICROSCOPIC  GLUCOSE, BODY FLUID OTHER  PROTEIN, BODY FLUID (OTHER)  SYNOVIAL CELL COUNT + DIFF, W/ CRYSTALS  SODIUM, URINE, RANDOM  UREA NITROGEN, URINE  CREATININE, URINE, RANDOM  COMPREHENSIVE METABOLIC PANEL  CBC WITH DIFFERENTIAL/PLATELET  HEMOGLOBIN A1C  HIV ANTIBODY (ROUTINE TESTING)  SEDIMENTATION RATE  C-REACTIVE PROTEIN  PREALBUMIN    EKG  EKG Interpretation  Date/Time:  Saturday March 06 2017 17:42:47 EST Ventricular Rate:  78 PR Interval:    QRS Duration: 149 QT Interval:  385 QTC Calculation: 439 R Axis:   -75 Text Interpretation:  Atrial fibrillation Ventricular premature complex Right bundle branch block Inferior infarct, age indeterminate Abnormal lateral Q waves Since last tracing rate slower Otherwise no significant change Confirmed by Deno Etienne [phone]) on 03/06/2017 6:11:59 PM       Radiology Dg Chest 2 View  Result Date: 03/06/2017 CLINICAL DATA:  46 year old male: Code sepsis EXAM: CHEST  2 VIEW COMPARISON:  Prior chest x-ray 12/01/2016 FINDINGS: Similar appearance of marked cardiomegaly. No focal airspace consolidation to suggest a bacterial pneumonia. Streaky airspace opacities in the lung bases are slightly more prominent than prior but favored to reflect atelectasis and chronic bronchitis. No pleural effusion or  pneumothorax. No acute osseous abnormality. IMPRESSION: 1. Stable marked cardiomegaly, chronic bronchitic changes and bibasilar atelectasis. Electronically Signed   By: Jacqulynn Cadet M.D.   On: 03/06/2017 18:51   Dg Tibia/fibula Left  Result Date: 03/06/2017 CLINICAL DATA:  Initial evaluation for soft tissue wound to left leg. Hypotension. EXAM: LEFT TIBIA AND FIBULA - 2 VIEW COMPARISON:  None. FINDINGS: No acute fracture or dislocation. Limited views of the knee and ankle are grossly unremarkable. Soft tissue wound present at the lateral aspect of the mid/lower left leg. No soft tissue emphysema radiopaque foreign body. No other soft tissue abnormality. IMPRESSION: 1. Soft tissue wound at the lateral aspect of the left lower leg. No associated emphysema or other abnormality. 2. No acute osseous abnormality about the left tibia/fibula. No evidence for acute osteomyelitis. Electronically Signed   By: Jeannine Boga M.D.   On: 03/06/2017 18:54    Procedures .Joint Aspiration/Arthrocentesis Date/Time: 03/06/2017 9:09 PM Performed by: Deno Etienne, DO Authorized by: Deno Etienne, DO   Consent:    Consent obtained:  Verbal   Consent given by:  Patient   Risks discussed:  Bleeding, infection, incomplete drainage and pain   Alternatives discussed:  No treatment Location:    Location:  Knee   Knee:  R knee Anesthesia (see MAR for exact dosages):    Anesthesia method:  Local infiltration   Local anesthetic:  Lidocaine 2% WITH epi Procedure details:    Preparation: Patient was prepped and draped in usual sterile fashion     Needle gauge: 21.   Ultrasound guidance: no     Approach:  Lateral   Aspirate amount:  40   Aspirate characteristics:  Blood-tinged, bloody and cloudy   Steroid injected: no     Specimen collected: yes   Post-procedure details:    Dressing:  Adhesive bandage  Patient tolerance of procedure:  Tolerated well, no immediate complications   (including critical care  time) Procedure note: Ultrasound Guided Peripheral IV Ultrasound guided peripheral 1.88 inch angiocath IV placement performed by me. Indications: Nursing unable to place IV. Details: The antecubital fossa and upper arm were evaluated with a multifrequency linear probe. Patent brachial veins were noted. 1 attempt was made to cannulate a vein under realtime US guidance with successful cannulation of the vein and catheter placement. There is return of non-pulsatile dark red blood. The patient tolerated the procedure well without complications. Images archived electronically.  CPT codes: [phone] and [phone] Procedure note: Ultrasound Guided Peripheral IV Ultrasound guided peripheral 1.88 inch angiocath IV placement performed by me. Indications: Nursing unable to place IV. Details: The antecubital fossa and upper arm were evaluated with a multifrequency linear probe. Patent brachial veins were noted. 1 attempt was made to cannulate a vein under realtime US guidance with successful cannulation of the vein and catheter placement. There is return of non-pulsatile dark red blood. The patient tolerated the procedure well without complications. Images archived electronically.  CPT codes: [phone] and [id number]  Medications Ordered in ED Medications  acetaminophen-codeine (TYLENOL #3) 300-30 MG per tablet 1-2 tablet (not administered)  albuterol (PROVENTIL) (2.5 MG/3ML) 0.083% nebulizer solution 2.5 mg (not administered)  benzonatate (TESSALON) capsule 100 mg (not administered)  carvedilol (COREG) tablet 37.5 mg (not administered)  diltiazem (CARDIZEM CD) 24 hr capsule 360 mg (not administered)  fluticasone (FLONASE) 50 MCG/ACT nasal spray 2 spray (not administered)  gabapentin (NEURONTIN) capsule 300 mg (not administered)  multivitamin with minerals tablet 2 tablet (not administered)  Rivaroxaban (XARELTO) tablet 15 mg (not administered)  sodium chloride flush (NS) 0.9 % injection 3 mL (not administered)  sodium  chloride flush (NS) 0.9 % injection 3 mL (not administered)  sodium chloride flush (NS) 0.9 % injection 3 mL (not administered)  0.9 %  sodium chloride infusion (not administered)  acetaminophen (TYLENOL) tablet 650 mg (not administered)    Or  acetaminophen (TYLENOL) suppository 650 mg (not administered)  senna-docusate (Senokot-S) tablet 1 tablet (not administered)  ondansetron (ZOFRAN) tablet 4 mg (not administered)    Or  ondansetron (ZOFRAN) injection 4 mg (not administered)  insulin glargine (LANTUS) injection 15 Units (not administered)  insulin aspart (novoLOG) injection 0-15 Units (not administered)  insulin aspart (novoLOG) injection 0-5 Units (not administered)  cefTRIAXone (ROCEPHIN) 2 g in dextrose 5 % 50 mL IVPB (not administered)  metroNIDAZOLE (FLAGYL) IVPB 500 mg (not administered)  vancomycin (VANCOCIN) 1,750 mg in sodium chloride 0.9 % 500 mL IVPB (not administered)  sodium chloride 0.9 % bolus 1,000 mL (1,000 mLs Intravenous New Bag/Given 03/06/17 1819)  vancomycin (VANCOCIN) 2,500 mg in sodium chloride 0.9 % 500 mL IVPB (0 mg Intravenous Stopped 03/06/17 2050)  piperacillin-tazobactam (ZOSYN) IVPB 3.375 g (0 g Intravenous Stopped 03/06/17 1933)  sodium chloride 0.9 % bolus 1,000 mL (1,000 mLs Intravenous New Bag/Given 03/06/17 1956)  lidocaine-EPINEPHrine (XYLOCAINE W/EPI) 2 %-1:200000 (PF) injection 20 mL (20 mLs Intradermal Given 03/06/17 2006)     Initial Impression / Assessment and Plan / ED Course  I have reviewed the triage vital signs and the nursing notes.  Pertinent labs & imaging results that were available during my care of the patient were reviewed by me and considered in my medical decision making (see chart for details).     46 yo M with a chief complaint of weakness.  Patient was found to be hypotensive.  His map is  greater than 65.  He appears to be dehydrated clinically.  We will give a fluid bolus and reassess.  Patient was found to have purulent  drainage from his left lower leg wound.  Will start on antibiotics.  The patient was a difficult IV stick and I placed 2 ultrasound-guided IV lines.  Ordered gentle fluids as the patient has a history of heart failure. I am somewhat unsure of his cause of hypotension.  It may be multifactorial.  The patient likely has worsening renal disease and with him not eating and drinking could be the source of his hypovolemia.  He does appear significantly dry.  With the patient's purulent drainage though I must treat with antibiotics.  Lactate is less than 4.  He has a positive troponin but looks below his baseline.  The patient is noted to have a MAP's <65/ SBP's <90. With the current information available to me, I don't think the patient is in septic shock. The MAP's <65/ SBP's <90, is related to [place] .    Discussed with the hospitalist for admission.  The patient was concerned about his chronic right knee pain.  This been going on for over the past couple years.  He has had it drained a few times and was requesting that I do it again.  Looking back at the electronic medical record I see one aspiration in the past but no fluid studies done.  Patient does have a history of rheumatoid arthritis that could be the recurrent cause of this.  I will send it off for fluid studies.  It was cloudy and red.  CRITICAL CARE Performed by: Cecilio Asper   Total critical care time: 80 minutes  Critical care time was exclusive of separately billable procedures and treating other patients.  Critical care was necessary to treat or prevent imminent or life-threatening deterioration.  Critical care was time spent personally by me on the following activities: development of treatment plan with patient and/or surrogate as well as nursing, discussions with consultants, evaluation of patient's response to treatment, examination of patient, obtaining history from patient or surrogate, ordering and  performing treatments and interventions, ordering and review of laboratory studies, ordering and review of radiographic studies, pulse oximetry and re-evaluation of patient's condition.  The patients results and plan were reviewed and discussed.   Any x-rays performed were independently reviewed by myself.   Differential diagnosis were considered with the presenting HPI.  Medications  acetaminophen-codeine (TYLENOL #3) 300-30 MG per tablet 1-2 tablet (not administered)  albuterol (PROVENTIL) (2.5 MG/3ML) 0.083% nebulizer solution 2.5 mg (not administered)  benzonatate (TESSALON) capsule 100 mg (not administered)  carvedilol (COREG) tablet 37.5 mg (not administered)  diltiazem (CARDIZEM CD) 24 hr capsule 360 mg (not administered)  fluticasone (FLONASE) 50 MCG/ACT nasal spray 2 spray (not administered)  gabapentin (NEURONTIN) capsule 300 mg (not administered)  multivitamin with minerals tablet 2 tablet (not administered)  Rivaroxaban (XARELTO) tablet 15 mg (not administered)  sodium chloride flush (NS) 0.9 % injection 3 mL (not administered)  sodium chloride flush (NS) 0.9 % injection 3 mL (not administered)  sodium chloride flush (NS) 0.9 % injection 3 mL (not administered)  0.9 %  sodium chloride infusion (not administered)  acetaminophen (TYLENOL) tablet 650 mg (not administered)    Or  acetaminophen (TYLENOL) suppository 650 mg (not administered)  senna-docusate (Senokot-S) tablet 1 tablet (not administered)  ondansetron (ZOFRAN) tablet 4 mg (not administered)    Or  ondansetron (ZOFRAN) injection 4 mg (not  administered)  insulin glargine (LANTUS) injection 15 Units (not administered)  insulin aspart (novoLOG) injection 0-15 Units (not administered)  insulin aspart (novoLOG) injection 0-5 Units (not administered)  cefTRIAXone (ROCEPHIN) 2 g in dextrose 5 % 50 mL IVPB (not administered)  metroNIDAZOLE (FLAGYL) IVPB 500 mg (not administered)  vancomycin (VANCOCIN) 1,750 mg in sodium  chloride 0.9 % 500 mL IVPB (not administered)  sodium chloride 0.9 % bolus 1,000 mL (1,000 mLs Intravenous New Bag/Given 03/06/17 1819)  vancomycin (VANCOCIN) 2,500 mg in sodium chloride 0.9 % 500 mL IVPB (0 mg Intravenous Stopped 03/06/17 2050)  piperacillin-tazobactam (ZOSYN) IVPB 3.375 g (0 g Intravenous Stopped 03/06/17 1933)  sodium chloride 0.9 % bolus 1,000 mL (1,000 mLs Intravenous New Bag/Given 03/06/17 1956)  lidocaine-EPINEPHrine (XYLOCAINE W/EPI) 2 %-1:200000 (PF) injection 20 mL (20 mLs Intradermal Given 03/06/17 2006)    Vitals:   03/06/17 2002 03/06/17 2015 03/06/17 2030 03/06/17 2045  BP: 101/77 110/69 106/74 113/78  Pulse: 78 81  79  Resp: (!) 23 (!) 21 18 18   Temp:      TempSrc:      SpO2: 100% 97%  96%  Weight:      Height:        Final diagnoses:  Acute renal failure superimposed on stage 4 chronic kidney disease, unspecified acute renal failure type (HCC)  Hypotension due to hypovolemia    Admission/ observation were discussed with the admitting physician, patient and/or family and they are comfortable with the plan.   Final Clinical Impressions(s) / ED Diagnoses   Final diagnoses:  Acute renal failure superimposed on stage 4 chronic kidney disease, unspecified acute renal failure type ([place])  Hypotension due to hypovolemia    ED Discharge Orders    None       Deno Etienne, DO 03/06/17 2111

## 2017-03-06 NOTE — ED Notes (Signed)
Delay in lab draw,  edp at bedside. 

## 2017-03-07 ENCOUNTER — Encounter (HOSPITAL_COMMUNITY): Payer: BLUE CROSS/BLUE SHIELD

## 2017-03-07 ENCOUNTER — Other Ambulatory Visit: Payer: Self-pay

## 2017-03-07 DIAGNOSIS — M25461 Effusion, right knee: Secondary | ICD-10-CM

## 2017-03-07 DIAGNOSIS — M2540 Effusion, unspecified joint: Secondary | ICD-10-CM

## 2017-03-07 LAB — CBC WITH DIFFERENTIAL/PLATELET
BASOS PCT: 0 %
Basophils Absolute: 0 10*3/uL (ref 0.0–0.1)
EOS ABS: 0 10*3/uL (ref 0.0–0.7)
Eosinophils Relative: 0 %
HCT: 29.5 % — ABNORMAL LOW (ref 39.0–52.0)
Hemoglobin: 9.7 g/dL — ABNORMAL LOW (ref 13.0–17.0)
Lymphocytes Relative: 12 %
Lymphs Abs: 1.8 10*3/uL (ref 0.7–4.0)
MCH: 29 pg (ref 26.0–34.0)
MCHC: 32.9 g/dL (ref 30.0–36.0)
MCV: 88.3 fL (ref 78.0–100.0)
MONO ABS: 0.4 10*3/uL (ref 0.1–1.0)
Monocytes Relative: 3 %
NEUTROS PCT: 85 %
Neutro Abs: 12.4 10*3/uL — ABNORMAL HIGH (ref 1.7–7.7)
PLATELETS: 243 10*3/uL (ref 150–400)
RBC: 3.34 MIL/uL — ABNORMAL LOW (ref 4.22–5.81)
RDW: 16.3 % — ABNORMAL HIGH (ref 11.5–15.5)
WBC: 14.6 10*3/uL — ABNORMAL HIGH (ref 4.0–10.5)

## 2017-03-07 LAB — GLUCOSE, CAPILLARY
GLUCOSE-CAPILLARY: 144 mg/dL — AB (ref 65–99)
GLUCOSE-CAPILLARY: 159 mg/dL — AB (ref 65–99)
GLUCOSE-CAPILLARY: 267 mg/dL — AB (ref 65–99)
Glucose-Capillary: 330 mg/dL — ABNORMAL HIGH (ref 65–99)
Glucose-Capillary: 321 mg/dL — ABNORMAL HIGH (ref 65–99)

## 2017-03-07 LAB — COMPREHENSIVE METABOLIC PANEL
ALK PHOS: 68 U/L (ref 38–126)
ALT: 8 U/L — AB (ref 17–63)
AST: 8 U/L — ABNORMAL LOW (ref 15–41)
Albumin: 2.4 g/dL — ABNORMAL LOW (ref 3.5–5.0)
Anion gap: 14 (ref 5–15)
BILIRUBIN TOTAL: 1.1 mg/dL (ref 0.3–1.2)
BUN: 91 mg/dL — ABNORMAL HIGH (ref 6–20)
CALCIUM: 8.2 mg/dL — AB (ref 8.9–10.3)
CO2: 23 mmol/L (ref 22–32)
CREATININE: 4.18 mg/dL — AB (ref 0.61–1.24)
Chloride: 90 mmol/L — ABNORMAL LOW (ref 101–111)
GFR, EST AFRICAN AMERICAN: 18 mL/min — AB (ref >60)
GFR, EST NON AFRICAN AMERICAN: 16 mL/min — AB (ref >60)
Glucose, Bld: 158 mg/dL — ABNORMAL HIGH (ref 65–99)
Potassium: 4.3 mmol/L (ref 3.5–5.1)
Sodium: 127 mmol/L — ABNORMAL LOW (ref 135–145)
TOTAL PROTEIN: 6.5 g/dL (ref 6.5–8.1)

## 2017-03-07 LAB — TROPONIN I: Troponin I: 0.11 ng/mL (ref <0.03)

## 2017-03-07 LAB — MRSA PCR SCREENING: MRSA by PCR: NEGATIVE

## 2017-03-07 LAB — HIV ANTIBODY (ROUTINE TESTING W REFLEX): HIV SCREEN 4TH GENERATION: NONREACTIVE

## 2017-03-07 MED ORDER — METHYLPREDNISOLONE SODIUM SUCC 40 MG IJ SOLR
40.0000 mg | Freq: Two times a day (BID) | INTRAMUSCULAR | Status: DC
  Administered 2017-03-07 – 2017-03-08 (×3): 40 mg via INTRAVENOUS
  Filled 2017-03-07 (×3): qty 1

## 2017-03-07 NOTE — Progress Notes (Signed)
Patient's oxygen increased from 2 lpm via [place] to 3lpm due to desaturations and increased snoring.

## 2017-03-07 NOTE — Progress Notes (Signed)
Patient admitted to room 3M06 from emergency department.  Patient oriented to unit and room.  Troponin drawn by lab.  Result elevated 0.13.  Physician on call notified via Amnion.  Patient made comfortable.  Will continue to monitor patient this shift.

## 2017-03-07 NOTE — Progress Notes (Signed)
VASCULAR LAB    Patient had ABI done 02/19/2017.  Right ABI was normal with abnormal great toe pressure.  Left ABI was normal with normal great toe pressure.  Please see full results under results review.  [name], RVT 03/07/2017, 7:04 AM

## 2017-03-07 NOTE — Progress Notes (Signed)
PROGRESS NOTE    John Bailey   PIR:[id number]  DOB: 1971/02/13  DOA: 03/06/2017 PCP: [name], MD   Brief Narrative:   John Bailey is a 46 y.o. male with chronic kidney disease stage IV, atrial fibrillation on Xarelto, chronic diastolic CHF (grade 3), IDDM, HTN, and rheumatoid arthritis (intermittently on Prednisone).  Presented for 3 days of weakness. Has had steady increase of pedal edema for many weeks now and has chronic leg wounds.   Hypotensive in ER 84/64, LA 2/56, Troponin 0.17, BNP 792.  Sodium 126- ? Dehydrated but with significant pedal edema. Hypoxic on 2 L O2 with pulse ox in mid 90s. Right knee swelling and pain noted- bedside arthrocentesis done by ER.  Noted to have a left leg ulcer with cellulitis and purulent drainage- WBC count 18.  AKI on CKD4.  Plan: Started on Vanc/Zosyn, given 2 L fluid bolus, f/u knee joint aspirate.  Subjective: Complains of severe weakness and trouble walking.  ROS: no complaints of nausea, vomiting, constipation diarrhea, cough, dyspnea or dysuria. No other complaints.   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 Return in about 6 weeks (around 10/05/2016). " - he does not recall receiving the Orencia and did not go back to rheumatology for a follow up - he states that the Allopurinol was discontinued by his PCP - will start IV  Solumedrol which should treat both pseudo gout and Rh arthritis flare- follow  Chronic diastolic CHF - pulse ox 63% on room air today - hold diuretics for now (Zaroxolyn and Lasix)    Hyponatremia - hold diuretics today- no pulm or pedal edema noted on exam  Left leg ulcer - chronic ulcer not has a small amount of pus- cultures sent    Morbid obesity Body mass index is 42.56 kg/m.    Paroxysmal atrial fibrillation  - on Xarelto, Coreg, Cardizem - rate controlled     Insulin-dependent diabetes mellitus with renal complications  - cont Lantus and SSI - A1c 9.2     Essential hypertension - Coreg, Diltiazem     Acute renal failure superimposed on stage 4 chronic kidney disease  - holding diuretics for today  Mild Troponin  - elevation- no chest pain- likely due to renal failure    DVT prophylaxis: Xarelto Code Status: Full code Family Communication:  Disposition Plan: home when stable Consultants:    Procedures:   Right knee arthrocentesis by ER Antimicrobials:  Anti-infectives (From admission, onward)   Start     Dose/Rate Route Frequency Ordered Stop   03/08/17 1900  vancomycin (VANCOCIN) 1,750 mg in sodium chloride 0.9 % 500 mL IVPB     1,750 mg 250 mL/hr over 120 Minutes Intravenous Every 48 hours 03/06/17 2042     03/06/17 2030  cefTRIAXone (ROCEPHIN) 2 g in dextrose 5 % 50 mL IVPB     2  g 100 mL/hr over 30 Minutes Intravenous Every 24 hours 03/06/17 2027     03/06/17 2030  metroNIDAZOLE (FLAGYL) IVPB 500 mg     500 mg 100 mL/hr over 60 Minutes Intravenous Every 8 hours 03/06/17 2027     03/06/17 1830  vancomycin (VANCOCIN) 2,500 mg in sodium chloride 0.9 % 500 mL IVPB     2,500 mg 250 mL/hr over 120 Minutes Intravenous  Once 03/06/17 1816 03/06/17 2050   03/06/17 1830  piperacillin-tazobactam (ZOSYN) IVPB 3.375 g     3.375 g 100 mL/hr over 30 Minutes Intravenous  Once 03/06/17 1816 03/06/17 1933       Objective: Vitals:   03/07/17 0334 03/07/17  0400 03/07/17 0700 03/07/17 0801  BP:   (!) 106/51 113/77  Pulse:  96 96 87  Resp:  19 (!) 24 (!) 22  Temp: 98.9 F (37.2 C)  99.1 F (37.3 C) 98.9 F (37.2 C)  TempSrc: Oral  Oral Oral  SpO2:  100% 100% 100%  Weight:      Height:        Intake/Output Summary (Last 24 hours) at 03/07/2017 0940 Last data filed at 03/07/2017 0810 Gross per 24 hour  Intake 2860 ml  Output 300 ml  Net 2560 ml   Filed Weights   03/06/17 2000 03/06/17 2216  Weight: 136.1 kg (300 lb) (!) 138.4 kg (305 lb 1.9 oz)    Examination: General exam: Appears comfortable  HEENT: PERRLA, oral mucosa moist, no sclera icterus or thrush Respiratory system: Clear to auscultation. Respiratory effort normal. Cardiovascular system: S1 & S2 heard, IIRR.  No murmurs  Gastrointestinal system: Abdomen soft, non-tender, nondistended. Normal bowel sound. No organomegaly Central nervous system: Alert and oriented. No focal neurological deficits. Extremities: No cyanosis, clubbing or edema- swelling tenderness in right knee, b/l hands, right wrist and b/l shoulders Skin: small ulcer on left shin - a small amount of pus can be expressed from it Psychiatry:  Mood & affect appropriate.     Data Reviewed: I have personally reviewed following labs and imaging studies  CBC: Recent Labs  Lab 03/06/17 1800 03/06/17 1821 03/07/17 0458  WBC 18.5*  --  14.6*  NEUTROABS 16.0  --  12.4*  HGB 10.7* 12.2* 9.7*  HCT 32.4* 36.0* 29.5*  MCV 88.8  --  88.3  PLT 266  --  416   Basic Metabolic Panel: Recent Labs  Lab 03/06/17 1800 03/06/17 1821 03/07/17 0458  NA 126* 127* 127*  K 4.9 5.0 4.3  CL 85* 86* 90*  CO2 26  --  23  GLUCOSE 244* 243* 158*  BUN 87* 79* 91*  CREATININE 4.34* 4.10* 4.18*  CALCIUM 8.7*  --  8.2*   GFR: Estimated Creatinine Clearance: 32.1 mL/min (A) (by C-G formula based on SCr of 4.18 mg/dL (H)). Liver Function Tests: Recent Labs  Lab 03/06/17 1800 03/07/17 0458  AST 15 8*  ALT 9* 8*    ALKPHOS 78 68  BILITOT 1.5* 1.1  PROT 7.2 6.5  ALBUMIN 2.7* 2.4*   No results for input(s): LIPASE, AMYLASE in the last 168 hours. No results for input(s): AMMONIA in the last 168 hours. Coagulation Profile: No results for input(s): INR, PROTIME in the last 168 hours. Cardiac Enzymes: Recent Labs  Lab 03/06/17 2254 03/07/17 0458  TROPONINI 0.13* 0.11*   BNP (last 3 results) No results for input(s): PROBNP in the last 8760 hours. HbA1C: Recent Labs    03/06/17 2057  HGBA1C 9.2*  CBG: Recent Labs  Lab 03/06/17 2343  GLUCAP 215*   Lipid Profile: No results for input(s): CHOL, HDL, LDLCALC, TRIG, CHOLHDL, LDLDIRECT in the last 72 hours. Thyroid Function Tests: No results for input(s): TSH, T4TOTAL, FREET4, T3FREE, THYROIDAB in the last 72 hours. Anemia Panel: No results for input(s): VITAMINB12, FOLATE, FERRITIN, TIBC, IRON, RETICCTPCT in the last 72 hours. Urine analysis:    Component Value Date/Time   COLORURINE AMBER (A) 03/06/2017 2155   APPEARANCEUR CLOUDY (A) 03/06/2017 2155   LABSPEC 1.015 03/06/2017 2155   PHURINE 5.0 03/06/2017 2155   GLUCOSEU NEGATIVE 03/06/2017 2155   HGBUR NEGATIVE 03/06/2017 2155   BILIRUBINUR NEGATIVE 03/06/2017 2155   [place] NEGATIVE 03/06/2017 2155   PROTEINUR 30 (A) 03/06/2017 2155   NITRITE NEGATIVE 03/06/2017 2155   LEUKOCYTESUR NEGATIVE 03/06/2017 2155   Sepsis Labs: '@LABRCNTIP' (procalcitonin:4,lacticidven:4) ) Recent Results (from the past 240 hour(s))  Gram stain     Status: None   Collection Time: 03/06/17  9:18 PM  Result Value Ref Range Status   Specimen Description FLUID SYNOVIAL RIGHT KNEE  Final   Special Requests NONE  Final   Gram Stain   Final    MODERATE WBC PRESENT, PREDOMINANTLY PMN NO ORGANISMS SEEN    Report Status 03/06/2017 FINAL  Final  MRSA PCR Screening     Status: None   Collection Time: 03/06/17 10:41 PM  Result Value Ref Range Status   MRSA by PCR NEGATIVE NEGATIVE Final    Comment:         The GeneXpert MRSA Assay (FDA approved for NASAL specimens only), is one component of a comprehensive MRSA colonization surveillance program. It is not intended to diagnose MRSA infection nor to guide or monitor treatment for MRSA infections.          Radiology Studies: Dg Chest 2 View  Result Date: 03/06/2017 CLINICAL DATA:  46 year old male: Code sepsis EXAM: CHEST  2 VIEW COMPARISON:  Prior chest x-ray 12/01/2016 FINDINGS: Similar appearance of marked cardiomegaly. No focal airspace consolidation to suggest a bacterial pneumonia. Streaky airspace opacities in the lung bases are slightly more prominent than prior but favored to reflect atelectasis and chronic bronchitis. No pleural effusion or pneumothorax. No acute osseous abnormality. IMPRESSION: 1. Stable marked cardiomegaly, chronic bronchitic changes and bibasilar atelectasis. Electronically Signed   By: Jacqulynn Cadet M.D.   On: 03/06/2017 18:51   Dg Tibia/fibula Left  Result Date: 03/06/2017 CLINICAL DATA:  Initial evaluation for soft tissue wound to left leg. Hypotension. EXAM: LEFT TIBIA AND FIBULA - 2 VIEW COMPARISON:  None. FINDINGS: No acute fracture or dislocation. Limited views of the knee and ankle are grossly unremarkable. Soft tissue wound present at the lateral aspect of the mid/lower left leg. No soft tissue emphysema radiopaque foreign body. No other soft tissue abnormality. IMPRESSION: 1. Soft tissue wound at the lateral aspect of the left lower leg. No associated emphysema or other abnormality. 2. No acute osseous abnormality about the left tibia/fibula. No evidence for acute osteomyelitis. Electronically Signed   By: Jeannine Boga M.D.   On: 03/06/2017 18:54      Scheduled Meds: . carvedilol  37.5 mg Oral BID WC  . diltiazem  360 mg Oral Daily  . fluticasone  2 spray Each Nare Daily  . gabapentin  300 mg Oral TID  . insulin aspart  0-15 Units Subcutaneous TID WC  . insulin aspart  0-5 Units  Subcutaneous QHS  . insulin glargine  15 Units Subcutaneous QHS  .  multivitamin with minerals  2 tablet Oral Daily  . rivaroxaban  15 mg Oral Q supper  . sodium chloride flush  3 mL Intravenous Q12H  . sodium chloride flush  3 mL Intravenous Q12H   Continuous Infusions: . sodium chloride    . cefTRIAXone (ROCEPHIN)  IV Stopped (03/06/17 2150)  . metronidazole Stopped (03/07/17 0521)  . [START ON 03/08/2017] vancomycin       LOS: 1 day    Time spent in minutes: [place], MD [facility] Pager: [email or web address] Password [facility] 03/07/2017, 9:40 AM

## 2017-03-07 NOTE — Progress Notes (Signed)
CRITICAL VALUE ALERT  Critical llab value Triponin 0.13  Date & Time Notied:  03/06/2017 0014  Provider Notified: C Bodenheimer  Orders Received/Actions taken: Awaiting orders.

## 2017-03-07 NOTE — Consult Note (Addendum)
[facility] Nurse wound consult note Reason for Consult: Consult requested for bilat leg wounds.  Pt states he is followed by the outpatient wound care center prior to admission. Wound type: Right outer calf with healing full thickness wound; .8X.8X.1cm, dry yellow wound bed, no odor or drainage or fluctuance. Left leg with full thickness wound; 1X1X.2cm, red moist wound bed, no odor, mod amt tan drainage. Dressing procedure/placement/frequency: Aquacel to absorb drainage and provide antimicrobial benefits to left leg, silicone foam dressing to promote healing to right leg.  Pt can resume followup with the outpatient wound care center after discharge. Please re-consult if further assistance is needed.  Thank-you,  [name] Girt MSN, [place], [place], [place], [place]

## 2017-03-08 LAB — BASIC METABOLIC PANEL
ANION GAP: 16 — AB (ref 5–15)
BUN: 104 mg/dL — ABNORMAL HIGH (ref 6–20)
CALCIUM: 8.8 mg/dL — AB (ref 8.9–10.3)
CO2: 22 mmol/L (ref 22–32)
Chloride: 89 mmol/L — ABNORMAL LOW (ref 101–111)
Creatinine, Ser: 3.8 mg/dL — ABNORMAL HIGH (ref 0.61–1.24)
GFR, EST AFRICAN AMERICAN: 21 mL/min — AB (ref >60)
GFR, EST NON AFRICAN AMERICAN: 18 mL/min — AB (ref >60)
GLUCOSE: 381 mg/dL — AB (ref 65–99)
POTASSIUM: 4.6 mmol/L (ref 3.5–5.1)
Sodium: 127 mmol/L — ABNORMAL LOW (ref 135–145)

## 2017-03-08 LAB — CBC
HEMATOCRIT: 32.9 % — AB (ref 39.0–52.0)
HEMOGLOBIN: 11 g/dL — AB (ref 13.0–17.0)
MCH: 29.5 pg (ref 26.0–34.0)
MCHC: 33.4 g/dL (ref 30.0–36.0)
MCV: 88.2 fL (ref 78.0–100.0)
Platelets: 316 10*3/uL (ref 150–400)
RBC: 3.73 MIL/uL — AB (ref 4.22–5.81)
RDW: 16 % — ABNORMAL HIGH (ref 11.5–15.5)
WBC: 12 10*3/uL — ABNORMAL HIGH (ref 4.0–10.5)

## 2017-03-08 LAB — URINE CULTURE: Culture: NO GROWTH

## 2017-03-08 LAB — GLUCOSE, CAPILLARY
GLUCOSE-CAPILLARY: 420 mg/dL — AB (ref 65–99)
GLUCOSE-CAPILLARY: 430 mg/dL — AB (ref 65–99)
GLUCOSE-CAPILLARY: 470 mg/dL — AB (ref 65–99)
GLUCOSE-CAPILLARY: 443 mg/dL — AB (ref 65–99)
GLUCOSE-CAPILLARY: 391 mg/dL — AB (ref 65–99)
Glucose-Capillary: 431 mg/dL — ABNORMAL HIGH (ref 65–99)

## 2017-03-08 LAB — UREA NITROGEN, URINE: Urea Nitrogen, Ur: 394 mg/dL

## 2017-03-08 LAB — PROTEIN, BODY FLUID (OTHER): TOTAL PROTEIN, BODY FLUID OTHER: 4.1 g/dL

## 2017-03-08 LAB — GLUCOSE, BODY FLUID OTHER: Glucose, Body Fluid Other: 149 mg/dL

## 2017-03-08 MED ORDER — METHYLPREDNISOLONE SODIUM SUCC 40 MG IJ SOLR
40.0000 mg | INTRAMUSCULAR | Status: DC
  Administered 2017-03-09: 40 mg via INTRAVENOUS
  Filled 2017-03-08: qty 1

## 2017-03-08 MED ORDER — INSULIN ASPART 100 UNIT/ML ~~LOC~~ SOLN
8.0000 [IU] | Freq: Once | SUBCUTANEOUS | Status: AC
Start: 2017-03-08 — End: 2017-03-08
  Administered 2017-03-08: 8 [IU] via SUBCUTANEOUS

## 2017-03-08 MED ORDER — INSULIN ASPART 100 UNIT/ML ~~LOC~~ SOLN
0.0000 [IU] | Freq: Every day | SUBCUTANEOUS | Status: DC
  Administered 2017-03-08: 11 [IU] via SUBCUTANEOUS

## 2017-03-08 MED ORDER — INSULIN NPH (HUMAN) (ISOPHANE) 100 UNIT/ML ~~LOC~~ SUSP
12.0000 [IU] | Freq: Once | SUBCUTANEOUS | Status: AC
  Administered 2017-03-08: 12 [IU] via SUBCUTANEOUS
  Filled 2017-03-08: qty 10

## 2017-03-08 MED ORDER — INSULIN ASPART 100 UNIT/ML ~~LOC~~ SOLN
5.0000 [IU] | Freq: Three times a day (TID) | SUBCUTANEOUS | Status: DC
  Administered 2017-03-08 – 2017-03-09 (×2): 5 [IU] via SUBCUTANEOUS

## 2017-03-08 MED ORDER — INSULIN ASPART 100 UNIT/ML ~~LOC~~ SOLN
15.0000 [IU] | Freq: Once | SUBCUTANEOUS | Status: AC
Start: 2017-03-08 — End: 2017-03-08
  Administered 2017-03-08: 15 [IU] via SUBCUTANEOUS

## 2017-03-08 MED ORDER — AMOXICILLIN-POT CLAVULANATE 875-125 MG PO TABS
1.0000 | ORAL_TABLET | Freq: Two times a day (BID) | ORAL | Status: DC
  Administered 2017-03-08 – 2017-03-09 (×3): 1 via ORAL
  Filled 2017-03-08 (×4): qty 1

## 2017-03-08 MED ORDER — INSULIN ASPART 100 UNIT/ML ~~LOC~~ SOLN
0.0000 [IU] | Freq: Three times a day (TID) | SUBCUTANEOUS | Status: DC
  Administered 2017-03-09: 15 [IU] via SUBCUTANEOUS

## 2017-03-08 MED ORDER — INSULIN GLARGINE 100 UNIT/ML ~~LOC~~ SOLN: 20.0000 [IU] | Freq: Every day | SUBCUTANEOUS | Status: DC

## 2017-03-08 MED ORDER — PREMIER PROTEIN SHAKE
11.0000 [oz_av] | Freq: Two times a day (BID) | ORAL | Status: DC
  Administered 2017-03-08: 11 [oz_av] via ORAL
  Filled 2017-03-08 (×7): qty 325.31

## 2017-03-08 MED ORDER — DOXYCYCLINE HYCLATE 100 MG PO TABS
100.0000 mg | ORAL_TABLET | Freq: Two times a day (BID) | ORAL | Status: DC
  Administered 2017-03-08 – 2017-03-09 (×3): 100 mg via ORAL
  Filled 2017-03-08 (×3): qty 1

## 2017-03-08 MED ORDER — INSULIN GLARGINE 100 UNIT/ML ~~LOC~~ SOLN
25.0000 [IU] | Freq: Every day | SUBCUTANEOUS | Status: DC
  Administered 2017-03-08: 25 [IU] via SUBCUTANEOUS
  Filled 2017-03-08: qty 0.25

## 2017-03-08 NOTE — Progress Notes (Signed)
CBG 430, 15 units of Novolog given. MD paged, awaiting orders. Pt is on steroids.

## 2017-03-08 NOTE — Progress Notes (Signed)
Transferred pt to 3E05. Report given to receiving RN.

## 2017-03-08 NOTE — Progress Notes (Signed)
PROGRESS NOTE    ANGELL HONSE   PPJ:[id number]  DOB: June 07, 1970  DOA: 03/06/2017 PCP: [name], MD   Brief Narrative:   John Bailey is a 46 y.o. male with chronic kidney disease stage IV, atrial fibrillation on Xarelto, chronic diastolic CHF (grade 3), IDDM, HTN, and rheumatoid arthritis (intermittently on Prednisone).  Presented for 3 days of weakness. Has had steady increase of pedal edema for many weeks now and has chronic leg wounds.   Hypotensive in ER 84/64, LA 2/56, Troponin 0.17, BNP 792.  Sodium 126- ? Dehydrated but with significant pedal edema. Hypoxic on 2 L O2 with pulse ox in mid 90s. Right knee swelling and pain noted- bedside arthrocentesis done by ER.  Noted to have a left leg ulcer with cellulitis and purulent drainage- WBC count 18.  AKI on CKD4.  Plan: Started on Vanc/Zosyn, given 2 L fluid bolus, f/u knee joint aspirate.  Subjective: Pain in arms and legs improving. Walking much better today. No new complaints.  ROS: no complaints of nausea, vomiting, constipation diarrhea, cough, dyspnea or dysuria. No other complaints.   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 Return in about 6 weeks (around 10/05/2016). " - he does not recall receiving the Orencia and did not go back to rheumatology for a follow  up - he states that the Allopurinol was discontinued by his PCP - will start IV Solumedrol which should treat both pseudo gout and Rh arthritis flare- follow - 12/23- joint swelling and pain improved significantly after steroids started- have discussed in detail that he must follow up with rheumatology for further treatment of his issues to prevent another such attack and future hospitalization  Chronic diastolic CHF/ dehydration with hyponatremia/ AKI - pulse ox 97% on room air  - no pulm or pedal edema noted on exam - holding diuretics for now (Zaroxolyn and Lasix) and allowing Cr and sodium to improve  Left leg ulcer - chronic ulcer not has a small amount of pus   - looks much cleaner today - will change to oral antibiotics    Morbid obesity Body mass index is 42.56 kg/m.    Paroxysmal atrial fibrillation  - on Xarelto, Coreg, Cardizem - rate controlled     Insulin-dependent diabetes mellitus with renal complications  - cont Lantus and SSI - A1c 9.2 - sugars elevated due to steroids and in 400s- adjusting insulin today     Essential hypertension - Coreg, Diltiazem     Acute renal failure superimposed on stage 4 chronic kidney disease  - holding diuretics for today  Mild Troponin  - elevation- no chest pain- likely due to renal failure    DVT prophylaxis: Xarelto Code Status: Full code Family Communication:  Disposition Plan: home when stable Consultants:    Procedures:   Right knee arthrocentesis  by ER Antimicrobials:  Anti-infectives (From admission, onward)   Start     Dose/Rate Route Frequency Ordered Stop   03/08/17 1900  vancomycin (VANCOCIN) 1,750 mg in sodium chloride 0.9 % 500 mL IVPB     1,750 mg 250 mL/hr over 120 Minutes Intravenous Every 48 hours 03/06/17 2042     03/06/17 2030  cefTRIAXone (ROCEPHIN) 2 g in dextrose 5 % 50 mL IVPB     2 g 100 mL/hr over 30 Minutes Intravenous Every 24 hours 03/06/17 2027     03/06/17 2030  metroNIDAZOLE (FLAGYL)  IVPB 500 mg     500 mg 100 mL/hr over 60 Minutes Intravenous Every 8 hours 03/06/17 2027     03/06/17 1830  vancomycin (VANCOCIN) 2,500 mg in sodium chloride 0.9 % 500 mL IVPB     2,500 mg 250 mL/hr over 120 Minutes Intravenous  Once 03/06/17 1816 03/06/17 2050   03/06/17 1830  piperacillin-tazobactam (ZOSYN) IVPB 3.375 g     3.375 g 100 mL/hr over 30 Minutes Intravenous  Once 03/06/17 1816 03/06/17 1933       Objective: Vitals:   03/08/17 0332 03/08/17 0338 03/08/17 0736 03/08/17 1100  BP:  (!) 131/92 (!) 120/107 101/76  Pulse:   95 80  Resp:  15 (!) 24 20  Temp: 98.1 F (36.7 C)  98 F (36.7 C) 97.8 F (36.6 C)  TempSrc: Oral  Oral Oral  SpO2:  100% 98% 97%  Weight:      Height:        Intake/Output Summary (Last 24 hours) at 03/08/2017 1358 Last data filed at 03/08/2017 0900 Gross per 24 hour  Intake 847 ml  Output 1075 ml  Net -228 ml   Filed Weights   03/06/17 2000 03/06/17 2216  Weight: 136.1 kg (300 lb) (!) 138.4 kg (305 lb 1.9 oz)    Examination: General exam: Appears comfortable  HEENT: PERRLA, oral mucosa moist, no sclera icterus or thrush Respiratory system: Clear to auscultation. Respiratory effort normal. Cardiovascular system: S1 & S2 heard, IIRR.  No murmurs  Gastrointestinal system: Abdomen soft, non-tender, nondistended. Normal bowel sound. No organomegaly Central nervous system: Alert and oriented. No focal neurological deficits. Extremities: No cyanosis, clubbing or edema- swelling tenderness in right knee, b/l hands, right wrist and b/l shoulders appears to be improving- increased range of motion in all major and minor joints Skin: small ulcer on left shin - no pus today Psychiatry:  Mood & affect appropriate.     Data Reviewed: I have personally reviewed following labs and imaging studies  CBC: Recent Labs  Lab 03/06/17 1800 03/06/17 1821 03/07/17 0458 03/08/17 0415  WBC 18.5*  --  14.6* 12.0*  NEUTROABS 16.0  --  12.4*  --   HGB  10.7* 12.2* 9.7* 11.0*  HCT 32.4* 36.0* 29.5* 32.9*  MCV 88.8  --  88.3 88.2  PLT 266  --  243 536   Basic Metabolic Panel: Recent Labs  Lab 03/06/17 1800 03/06/17 1821 03/07/17 0458 03/08/17 0415  NA 126* 127* 127* 127*  K 4.9 5.0 4.3 4.6  CL 85* 86* 90* 89*  CO2 26  --  23 22  GLUCOSE 244* 243* 158* 381*  BUN 87* 79* 91* 104*  CREATININE 4.34* 4.10* 4.18* 3.80*  CALCIUM 8.7*  --  8.2* 8.8*   GFR: Estimated Creatinine Clearance: 35.3 mL/min (A) (by C-G formula based on SCr of 3.8 mg/dL (H)). Liver Function Tests: Recent Labs  Lab 03/06/17 1800 03/07/17 0458  AST 15 8*  ALT 9* 8*  ALKPHOS 78 68  BILITOT 1.5* 1.1  PROT 7.2 6.5  ALBUMIN 2.7* 2.4*   No results for input(s): LIPASE, AMYLASE in the last 168 hours. No results for input(s): AMMONIA in the last 168 hours. Coagulation Profile: No results for input(s): INR, PROTIME in the last 168 hours. Cardiac Enzymes: Recent Labs  Lab 03/06/17 2254 03/07/17 0458  TROPONINI 0.13* 0.11*   BNP (last 3 results) No results for input(s): PROBNP in the last 8760 hours. HbA1C: Recent Labs    03/06/17 2057  HGBA1C 9.2*   CBG: Recent Labs  Lab 03/07/17 1211 03/07/17 1706 03/07/17 1936 03/07/17 2216 03/08/17 0739  GLUCAP 159* 267* 330* 321* 420*   Lipid Profile: No results for input(s): CHOL, HDL, LDLCALC, TRIG, CHOLHDL, LDLDIRECT in the last 72 hours. Thyroid Function Tests: No results for input(s): TSH, T4TOTAL, FREET4, T3FREE, THYROIDAB in the last 72 hours. Anemia Panel: No results for input(s): VITAMINB12, FOLATE, FERRITIN, TIBC, IRON, RETICCTPCT in the last 72 hours. Urine analysis:    Component Value Date/Time   COLORURINE AMBER (A) 03/06/2017 2155   APPEARANCEUR CLOUDY (A) 03/06/2017 2155   LABSPEC 1.015 03/06/2017 2155   PHURINE 5.0 03/06/2017 2155   GLUCOSEU NEGATIVE 03/06/2017 2155   HGBUR NEGATIVE 03/06/2017 2155   BILIRUBINUR NEGATIVE 03/06/2017 2155   KETONESUR NEGATIVE 03/06/2017 2155    PROTEINUR 30 (A) 03/06/2017 2155   NITRITE NEGATIVE 03/06/2017 2155   LEUKOCYTESUR NEGATIVE 03/06/2017 2155   Sepsis Labs: '@[email or web address]' (procalcitonin:4,lacticidven:4) ) Recent Results (from the past 240 hour(s))  Blood Culture (routine x 2)     Status: None (Preliminary result)   Collection Time: 03/06/17  6:08 PM  Result Value Ref Range Status   Specimen Description BLOOD RIGHT ANTECUBITAL  Final   Special Requests   Final    BOTTLES DRAWN AEROBIC AND ANAEROBIC Blood Culture adequate volume   Culture NO GROWTH 2 DAYS  Final   Report Status PENDING  Incomplete  Body fluid culture     Status: None (Preliminary result)   Collection Time: 03/06/17  7:33 PM  Result Value Ref Range Status   Specimen Description FLUID SYNOVIAL RIGHT KNEE  Final   Special Requests NONE  Final   Gram Stain PENDING  Incomplete   Culture NO GROWTH 2 DAYS  Final   Report Status PENDING  Incomplete  Blood Culture (routine x 2)     Status: None (Preliminary result)   Collection Time: 03/06/17  7:50 PM  Result Value Ref Range Status   Specimen Description BLOOD LEFT ANTECUBITAL  Final   Special Requests IN PEDIATRIC BOTTLE Blood Culture adequate volume  Final   Culture NO GROWTH 2 DAYS  Final   Report Status PENDING  Incomplete  Gram stain     Status: None   Collection Time: 03/06/17  9:18 PM  Result Value Ref Range Status   Specimen Description FLUID SYNOVIAL RIGHT KNEE  Final   Special Requests NONE  Final   Gram Stain   Final    MODERATE WBC PRESENT, PREDOMINANTLY PMN NO ORGANISMS SEEN    Report Status 03/06/2017 FINAL  Final  Urine culture     Status: None   Collection Time: 03/06/17  9:51 PM  Result Value Ref Range Status   Specimen Description URINE, RANDOM  Final   Special Requests NONE  Final   Culture NO GROWTH  Final   Report Status 03/08/2017 FINAL  Final  MRSA PCR Screening  Status: None   Collection Time: 03/06/17 10:41 PM  Result Value Ref Range Status   MRSA by PCR NEGATIVE  NEGATIVE Final    Comment:        The GeneXpert MRSA Assay (FDA approved for NASAL specimens only), is one component of a comprehensive MRSA colonization surveillance program. It is not intended to diagnose MRSA infection nor to guide or monitor treatment for MRSA infections.          Radiology Studies: Dg Chest 2 View  Result Date: 03/06/2017 CLINICAL DATA:  46 year old male: Code sepsis EXAM: CHEST  2 VIEW COMPARISON:  Prior chest x-ray 12/01/2016 FINDINGS: Similar appearance of marked cardiomegaly. No focal airspace consolidation to suggest a bacterial pneumonia. Streaky airspace opacities in the lung bases are slightly more prominent than prior but favored to reflect atelectasis and chronic bronchitis. No pleural effusion or pneumothorax. No acute osseous abnormality. IMPRESSION: 1. Stable marked cardiomegaly, chronic bronchitic changes and bibasilar atelectasis. Electronically Signed   By: Jacqulynn Cadet M.D.   On: 03/06/2017 18:51   Dg Tibia/fibula Left  Result Date: 03/06/2017 CLINICAL DATA:  Initial evaluation for soft tissue wound to left leg. Hypotension. EXAM: LEFT TIBIA AND FIBULA - 2 VIEW COMPARISON:  None. FINDINGS: No acute fracture or dislocation. Limited views of the knee and ankle are grossly unremarkable. Soft tissue wound present at the lateral aspect of the mid/lower left leg. No soft tissue emphysema radiopaque foreign body. No other soft tissue abnormality. IMPRESSION: 1. Soft tissue wound at the lateral aspect of the left lower leg. No associated emphysema or other abnormality. 2. No acute osseous abnormality about the left tibia/fibula. No evidence for acute osteomyelitis. Electronically Signed   By: Jeannine Boga M.D.   On: 03/06/2017 18:54      Scheduled Meds: . carvedilol  37.5 mg Oral BID WC  . diltiazem  360 mg Oral Daily  . fluticasone  2 spray Each Nare Daily  . gabapentin  300 mg Oral TID  . insulin aspart  0-15 Units Subcutaneous TID WC   . insulin aspart  0-5 Units Subcutaneous QHS  . insulin glargine  20 Units Subcutaneous QHS  . [START ON 03/09/2017] methylPREDNISolone (SOLU-MEDROL) injection  40 mg Intravenous Q24H  . multivitamin with minerals  2 tablet Oral Daily  . protein supplement shake  11 oz Oral BID BM  . rivaroxaban  15 mg Oral Q supper  . sodium chloride flush  3 mL Intravenous Q12H  . sodium chloride flush  3 mL Intravenous Q12H   Continuous Infusions: . sodium chloride    . cefTRIAXone (ROCEPHIN)  IV Stopped (03/07/17 2139)  . metronidazole 500 mg (03/08/17 1257)  . vancomycin       LOS: 2 days    Time spent in minutes: [place], MD [facility] Pager: [email or web address] Password [email or web address] 03/08/2017, 1:58 PM

## 2017-03-08 NOTE — Care Management Note (Signed)
Case Management Note  Patient Details  Name: John Bailey MRN: [id number] Date of Birth: 1970/04/28  Subjective/Objective:   From home, presents with diffuse joint pain and swelling, chronic diastolic chf,hyponatremia,left leg ulcer, morbid obesity, paf, DM, htn, acute renal failure.                 Action/Plan: NCM will follow for dc needs.   Expected Discharge Date:  03/10/17               Expected Discharge Plan:     In-House Referral:     Discharge planning Services  CM Consult  Post Acute Care Choice:    Choice offered to:     DME Arranged:    DME Agency:     HH Arranged:    HH Agency:     Status of Service:  In process, will continue to follow  If discussed at Long Length of Stay Meetings, dates discussed:    Additional Comments:  Zenon Mayo, RN 03/08/2017, 9:17 AM

## 2017-03-08 NOTE — Progress Notes (Signed)
Notified Dr. Wynelle Cleveland of pt's blood glucose 431.

## 2017-03-08 NOTE — Progress Notes (Signed)
Ambulated with pt to the bathroom. Pt tolerated well. Blood glucose is still high will notify Dr. Again.

## 2017-03-08 NOTE — Progress Notes (Signed)
Initial Nutrition Assessment  DOCUMENTATION CODES:   Morbid obesity  INTERVENTION:  - Will order Premier Protein BID, each supplement provides 160 kcal, 5 grams of carbohydrate, and 30 grams of protein.  - Continue to encourage PO intakes of meals and supplements. - Discussed protein-rich foods and the importance of protein for wound healing. - Recommend outpatient diet education at [facility]   NUTRITION DIAGNOSIS:   Inadequate oral intake related to decreased appetite as evidenced by per patient/family report, meal completion < 50%.   GOAL:   Patient will meet greater than or equal to 90% of their needs  MONITOR:   PO intake, Supplement acceptance, Weight trends, Labs, Skin  REASON FOR ASSESSMENT:   Consult Wound healing  ASSESSMENT:   46 y.o. male with chronic kidney disease stage IV, atrial fibrillation on Xarelto, chronic diastolic CHF (grade 3), IDDM, HTN, and rheumatoid arthritis. Presented for 3 days of weakness. Has had steady increase of pedal edema for many weeks now and has chronic leg wounds.  BMI indicates morbid obesity. Per chart review, pt consumed 25% of breakfast and lunch and 100% of dinner yesterday (total of 740 kcal, 41 grams of protein). Visualized breakfast tray which was mainly untouched. Pt reports poor appetite for the past 5-6 days but with no abdominal pain or nausea and no difficulties with eating, simply uninterested in eating and not feeling hungry.  Pt reports that he goes to a wound clinic and that staff there has had discussions with him about the importance of protein intake. Talked with pt about protein-rich foods and continued to encourage a focus on protein. Pt is aware of this being became of need to "close up" wounds on his legs. Pt reports that wounds have improved and number of wounds has lessened compared to previously. Talked with him about Premier Protein and pt is very open to this and interested  in trying supplement; informed him of ability to get these once he leaves the hospital buy purchasing at places such as [facility] or [place].   Per chart review, pt has gained 23 lbs since 12/30/14 which is likely at least partly fluid related given current edema and pt reporting being in the hospital for ~1 month around that time.  Medications reviewed; sliding scale Novolog, 20 units Lantus/day, 12 units Humulin x1 dose yesterday, 40 mg Solu-medrol/day, 2 tablets multivitamin with minerals/day. Labs reviewed; CBG: 420 mg/dL this AM, Na: 127 mmol/L, Cl: 89 mmol/L, BUN: 104 mg/dL, creatinine: 3.8 mg/dL, Ca: 8.8 mg/dL, GFR: 21 mg/dL.       NUTRITION - FOCUSED PHYSICAL EXAM:  Completed/assessed and no muscle or fat wasting noted, mild edema to all extremities.  Diet Order:  Diet heart healthy/carb modified Room service appropriate? Yes; Fluid consistency: Thin; Fluid restriction: 1500 mL Fluid  EDUCATION NEEDS:   Education needs have been addressed  Skin:  Skin Assessment: Skin Integrity Issues: Skin Integrity Issues:: Other (Comment) Other: Non-pressure injuries to bilateral lower legs  Last BM:  12/23  Height:   Ht Readings from Last 1 Encounters:  03/06/17 5\' 11"  (1.803 m)    Weight:   Wt Readings from Last 1 Encounters:  03/06/17 (!) 305 lb 1.9 oz (138.4 kg)    Ideal Body Weight:  78.18 kg  BMI:  Body mass index is 42.56 kg/m.  Estimated Nutritional Needs:   Kcal:  7078-6754 (15-17 kcal/kg)  Protein:  110-135 grams (0.8-1 grams/kg)  Fluid:  >/= 2.1 L/day     John Bailey  Ceasar Mons, MS, RD, LDN, CNSC Inpatient Clinical Dietitian Pager # [phone] After hours/weekend pager # [phone]

## 2017-03-08 NOTE — Progress Notes (Signed)
Inpatient Diabetes Program Recommendations  AACE/ADA: New Consensus Statement on Inpatient Glycemic Control (2015)  Target Ranges:  Prepandial:   less than 140 mg/dL      Peak postprandial:   less than 180 mg/dL (1-2 hours)      Critically ill patients:  140 - 180 mg/dL   Results for John Bailey, John Bailey" (MRN [id number]) as of 03/08/2017 09:47  Ref. Range 03/07/2017 08:00 03/07/2017 12:11 03/07/2017 17:06 03/07/2017 19:36 03/07/2017 22:16 03/08/2017 07:39  Glucose-Capillary Latest Ref Range: 65 - 99 mg/dL 144 (H) 159 (H) 267 (H) 330 (H) 321 (H) 420 (H)   Review of Glycemic Control  Diabetes history: DM2  Outpatient Diabetes medications: Lantus 10 units QAM, Lantus 15 units QPM, Novolog 0-12 units TID with meals Current orders for Inpatient glycemic control: Lantus 20 units daily, Novolog 0-15 units TID with meals, Novolog 0-5 units QHS; Solumedrol 40 mg Q24H  Inpatient Diabetes Program Recommendations: Insulin - Basal: If patient will continue on Solumedrol 40 mg Q24H, please consider increasing Lanuts to further to 25 units QHS. Insulin - Meal Coverage: If steroids are continued as ordered, please consider ordering Novolog 5 units TID with meals for meal coverage if patient eats at least 50% of meals.  Thanks, Barnie Alderman, RN, MSN, CDE Diabetes Coordinator Inpatient Diabetes Program [phone] (Team Pager from 8am to 5pm)

## 2017-03-09 DIAGNOSIS — M1120 Other chondrocalcinosis, unspecified site: Secondary | ICD-10-CM

## 2017-03-09 DIAGNOSIS — I9589 Other hypotension: Secondary | ICD-10-CM

## 2017-03-09 DIAGNOSIS — M0579 Rheumatoid arthritis with rheumatoid factor of multiple sites without organ or systems involvement: Secondary | ICD-10-CM

## 2017-03-09 DIAGNOSIS — M069 Rheumatoid arthritis, unspecified: Secondary | ICD-10-CM

## 2017-03-09 DIAGNOSIS — Z6841 Body Mass Index (BMI) 40.0 and over, adult: Secondary | ICD-10-CM

## 2017-03-09 DIAGNOSIS — L98492 Non-pressure chronic ulcer of skin of other sites with fat layer exposed: Secondary | ICD-10-CM

## 2017-03-09 DIAGNOSIS — L089 Local infection of the skin and subcutaneous tissue, unspecified: Secondary | ICD-10-CM

## 2017-03-09 DIAGNOSIS — E861 Hypovolemia: Secondary | ICD-10-CM

## 2017-03-09 LAB — BASIC METABOLIC PANEL
ANION GAP: 14 (ref 5–15)
BUN: 116 mg/dL — ABNORMAL HIGH (ref 6–20)
CALCIUM: 9 mg/dL (ref 8.9–10.3)
CO2: 25 mmol/L (ref 22–32)
CREATININE: 3.48 mg/dL — AB (ref 0.61–1.24)
Chloride: 90 mmol/L — ABNORMAL LOW (ref 101–111)
GFR, EST AFRICAN AMERICAN: 23 mL/min — AB (ref >60)
GFR, EST NON AFRICAN AMERICAN: 20 mL/min — AB (ref >60)
Glucose, Bld: 365 mg/dL — ABNORMAL HIGH (ref 65–99)
Potassium: 4.3 mmol/L (ref 3.5–5.1)
SODIUM: 129 mmol/L — AB (ref 135–145)

## 2017-03-09 LAB — CBC
HCT: 33.4 % — ABNORMAL LOW (ref 39.0–52.0)
HEMOGLOBIN: 11.2 g/dL — AB (ref 13.0–17.0)
MCH: 29.3 pg (ref 26.0–34.0)
MCHC: 33.5 g/dL (ref 30.0–36.0)
MCV: 87.4 fL (ref 78.0–100.0)
PLATELETS: 377 10*3/uL (ref 150–400)
RBC: 3.82 MIL/uL — AB (ref 4.22–5.81)
RDW: 15.8 % — ABNORMAL HIGH (ref 11.5–15.5)
WBC: 11.5 10*3/uL — AB (ref 4.0–10.5)

## 2017-03-09 LAB — GLUCOSE, CAPILLARY
GLUCOSE-CAPILLARY: 333 mg/dL — AB (ref 65–99)
GLUCOSE-CAPILLARY: 415 mg/dL — AB (ref 65–99)

## 2017-03-09 LAB — BODY FLUID CULTURE: CULTURE: NO GROWTH

## 2017-03-09 MED ORDER — INSULIN NPH (HUMAN) (ISOPHANE) 100 UNIT/ML ~~LOC~~ SUSP
20.0000 [IU] | Freq: Once | SUBCUTANEOUS | Status: AC
  Administered 2017-03-09: 20 [IU] via SUBCUTANEOUS
  Filled 2017-03-09: qty 10

## 2017-03-09 MED ORDER — PREDNISONE 10 MG PO TABS: ORAL_TABLET | ORAL | 0 refills | Status: DC

## 2017-03-09 MED ORDER — AMOXICILLIN-POT CLAVULANATE 875-125 MG PO TABS: 1.0000 | ORAL_TABLET | Freq: Two times a day (BID) | ORAL | 0 refills | Status: DC

## 2017-03-09 MED ORDER — INSULIN ASPART 100 UNIT/ML FLEXPEN: 0.0000 [IU] | PEN_INJECTOR | Freq: Three times a day (TID) | SUBCUTANEOUS | 11 refills | Status: DC | PRN

## 2017-03-09 MED ORDER — DOXYCYCLINE HYCLATE 100 MG PO TABS: 100.0000 mg | ORAL_TABLET | Freq: Two times a day (BID) | ORAL | 0 refills | Status: DC

## 2017-03-09 MED ORDER — GABAPENTIN 300 MG PO CAPS: 300.0000 mg | ORAL_CAPSULE | Freq: Two times a day (BID) | ORAL | 0 refills | Status: DC

## 2017-03-09 MED ORDER — INSULIN GLARGINE 100 UNIT/ML SOLOSTAR PEN: 25.0000 [IU] | PEN_INJECTOR | Freq: Every day | SUBCUTANEOUS | 11 refills | Status: DC

## 2017-03-09 NOTE — Discharge Summary (Signed)
Physician Discharge Summary  John Bailey VXB:[id number] DOB: 1970/12/07 DOA: 03/06/2017  PCP: [name], MD  Admit date: 03/06/2017 Discharge date: 03/09/2017  Admitted From: home  Disposition:  home   Recommendations for Outpatient Follow-up:  1. Bmet on follow up visit this Friday 2. F/u sugars and adjust Insulin as needed 3. F/u for resumption of Lasix- do not resume if sugars still high and if auto diuresing because of this 4. Needs to have a strict weight loss plan in conjunction with PCP advice 5. NOTE: due to his level of renal function, his Neurontin dose has been decreased 6. 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 Mobility has improved considerably and pain has resolved.   Brief Summary: John Bartoszek McCrayis a 45 y.o.malewithchronic kidney disease stage IV, atrial fibrillation on Xarelto, chronic diastolic CHF (grade 3), IDDM, HTN, and rheumatoid arthritis (intermittently on Prednisone).  Presented for 3 days of weakness. Has had steady increase of pedal edema for many weeks now and has chronic leg wounds.   Hypotensive in ER 84/64, LA 2/56, Troponin 0.17, BNP 792.  Sodium 126- ? Dehydrated but with significant pedal edema. Hypoxic on 2 L O2 with pulse ox  in mid 90s. Right knee swelling and pain noted- bedside arthrocentesis done by ER.  Noted to have a left leg ulcer with cellulitis and purulent drainage- WBC count 18.  AKI on CKD4.  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 Return in about 6 weeks (around 10/05/2016)." - he does not recall receiving the Orencia and did not go back to rheumatology for a follow up - he states that the Allopurinol was discontinued by his PCP (does not need this for pseudogout) - 12/22- will start IV Solumedrol which should treat both pseudo gout and Rh arthritis flare- follow - 12/23- joint swelling and pain improved significantly after steroids started - 12/25- continues to improve- have discussed in detail that he must follow up with rheumatology for further treatment to prevent another such attack  (  he has an appt in 2 wks) - will be giving a slow prednisone taper and recommendations to go back to PCP if swelling recurs during the taper.    Chronic diastolic CHF/ dehydration with hyponatremia  Acute renal failure superimposed on stage 4 chronic kidney disease  - Cr 4.34 on admission- he has not been taking Zaroxolyn at home as he never got refills on it but has been taking his Lasix - sodium was 126 on  admission- has improved to 129 with holding Lasix - Cr has improved to 3.48 - will recommend that he continue to hold Lasix and weigh himself daily - his sugars are running high from steroids and this may keep him auto diuresing  - as steroids are tapered sugars will improve and he is advised to resume Lasix if needed- instructions given    Insulin-dependent diabetes mellitus with renal complications  - cont Lantus and SSI - A1c 9.2 - sugars elevated due to steroids and in 400s- adjusted Lantus from 10 U to 25 U daily - he has not been using his mealtime insulin and have advised him not only will he need to resume it but based on his requirement in the hospital,  I will be increasing the dose while he is on steroids - he will be checking his sugars TID and show his log to his PCP on Friday to decide on further adjustment of Insulin  Left leg ulcer/ cellulitis - chronic ulcer which had a small amount of pus and surrounding cellulitis - looks much cleaner now - changed to oral antibiotics on 12/24- continue for 3 more days- he has an appt with the wound care clinic on Thursday    Morbid obesity Body mass index is 42.56 kg/m - needs to lose weight    Paroxysmal atrial fibrillation  - on Xarelto, Coreg, Cardizem - rate controlled   Essential hypertension - Coreg, Diltiazem  Mild Troponin  - elevation- no chest pain- likely due to renal failure    Discharge Instructions   Allergies as of 03/09/2017   No Known Allergies     Medication List    STOP taking these medications   furosemide 80 MG tablet Commonly known as:  LASIX   metolazone 5 MG tablet Commonly known as:  ZAROXOLYN     TAKE these medications   acetaminophen-codeine 300-30 MG tablet Commonly known as:  TYLENOL #3 Take 1 tablet by mouth every 8 (eight) hours as needed for moderate pain or severe pain.   albuterol 108 (90 Base) MCG/ACT inhaler Commonly known as:  PROVENTIL HFA;VENTOLIN HFA Inhale 1-2  puffs into the lungs every 6 (six) hours as needed for wheezing.   amoxicillin-clavulanate 875-125 MG tablet Commonly known as:  AUGMENTIN Take 1 tablet by mouth 2 (two) times daily.   benzonatate 100 MG capsule Commonly known as:  TESSALON Take 1 capsule (100 mg total) by mouth 3 (three) times daily as needed for cough.   carvedilol 12.5 MG tablet Commonly known as:  COREG Take 3 tablets (37.5 mg total) by mouth 2 (two) times daily with a meal.   celecoxib 100 MG capsule Commonly known as:  CELEBREX Take 100 mg by mouth daily.   colchicine 0.6 MG tablet Take 0.5 tablets (0.3 mg total) by mouth daily.   diltiazem 360 MG 24 hr capsule Commonly known as:  CARDIZEM CD Take 1 capsule (360 mg total) by mouth daily.   doxycycline 100 MG tablet Commonly known as:  VIBRA-TABS Take 1  tablet (100 mg total) by mouth every 12 (twelve) hours.   fluticasone 50 MCG/ACT nasal spray Commonly known as:  FLONASE Place 2 sprays into the nose daily.   gabapentin 300 MG capsule Commonly known as:  NEURONTIN Take 1 capsule (300 mg total) by mouth 2 (two) times daily. What changed:  when to take this   insulin aspart cartridge Commonly known as:  NOVOLOG PENFILL Inject 5 Units into the skin 3 (three) times daily with meals. What changed:  Another medication with the same name was changed. Make sure you understand how and when to take each.   insulin aspart 100 UNIT/ML FlexPen Commonly known as:  NOVOLOG FLEXPEN Inject 0-12 Units into the skin 3 (three) times daily as needed for high blood sugar. Sliding Scale <70, initiate hypoglycemia protocol, 70-100=0 units, 110-199= 3 units, 200-250=5 units, 251-300=7 units, 301-350=10 units, 351-400=12 units, >400=14 units. Call MD if >450 What changed:  additional instructions   Insulin Glargine 100 UNIT/ML Solostar Pen Commonly known as:  LANTUS Inject 25 Units into the skin daily at 10 pm. What changed:  how much to take   Insulin Pen Needle 31G  X 6 MM Misc 1 Device by Does not apply route 2 (two) times daily.   multivitamin with minerals Tabs tablet Take 2 tablets by mouth daily.   potassium chloride SA 20 MEQ tablet Commonly known as:  KLOR-CON M20 Take 1 tablet (20 mEq total) by mouth daily.   predniSONE 10 MG tablet Commonly known as:  DELTASONE 60 mg x 3 days 40 mg x 3 days 20 mg x 3 days 10 mg x 3 day 5 mg x 3 days   rivaroxaban 20 MG Tabs tablet Commonly known as:  XARELTO take 1 tablet by mouth once daily WITH SUPPER What changed:    how much to take  how to take this  when to take this  additional instructions      Follow-up Information    Hermelinda Medicus, MD Follow up.   Specialty:  Internal Medicine Contact information: [address] [place] [address] [phone]        [name], MD Follow up.   Specialty:  Internal Medicine Why:  by Friday.  Take weights and blood sugars with you.  Contact information: [address] [place] [address] [phone]          No Known Allergies   Procedures/Studies:   Dg Chest 2 View  Result Date: 03/06/2017 CLINICAL DATA:  46 year old male: Code sepsis EXAM: CHEST  2 VIEW COMPARISON:  Prior chest x-ray 12/01/2016 FINDINGS: Similar appearance of marked cardiomegaly. No focal airspace consolidation to suggest a bacterial pneumonia. Streaky airspace opacities in the lung bases are slightly more prominent than prior but favored to reflect atelectasis and chronic bronchitis. No pleural effusion or pneumothorax. No acute osseous abnormality. IMPRESSION: 1. Stable marked cardiomegaly, chronic bronchitic changes and bibasilar atelectasis. Electronically Signed   By: Jacqulynn Cadet M.D.   On: 03/06/2017 18:51   Dg Tibia/fibula Left  Result Date: 03/06/2017 CLINICAL DATA:  Initial evaluation for soft tissue wound to left leg. Hypotension. EXAM: LEFT TIBIA AND FIBULA - 2 VIEW COMPARISON:  None. FINDINGS: No acute  fracture or dislocation. Limited views of the knee and ankle are grossly unremarkable. Soft tissue wound present at the lateral aspect of the mid/lower left leg. No soft tissue emphysema radiopaque foreign body. No other soft tissue abnormality. IMPRESSION: 1. Soft tissue wound at the lateral aspect  of the left lower leg. No associated emphysema or other abnormality. 2. No acute osseous abnormality about the left tibia/fibula. No evidence for acute osteomyelitis. Electronically Signed   By: Jeannine Boga M.D.   On: 03/06/2017 18:54       Discharge Exam: Vitals:   03/09/17 0034 03/09/17 0509  BP: (!) 131/91 (!) 139/95  Pulse: 75 91  Resp: 18 18  Temp: 97.6 F (36.4 C) 97.6 F (36.4 C)  SpO2: 96% 97%   Vitals:   03/08/17 1434 03/08/17 1947 03/09/17 0034 03/09/17 0509  BP: (!) 117/93 122/86 (!) 131/91 (!) 139/95  Pulse: 72 87 75 91  Resp: '18 18 18 18  ' Temp:  97.9 F (36.6 C) 97.6 F (36.4 C) 97.6 F (36.4 C)  TempSrc:  Oral Oral Oral  SpO2: 100% 100% 96% 97%  Weight:    (!) 138 kg (304 lb 5 oz)  Height:        General: Pt is alert, awake, not in acute distress Cardiovascular: RRR, S1/S2 +, no rubs, no gallops Respiratory: CTA bilaterally, no wheezing, no rhonchi Abdominal: Soft, NT, ND, bowel sounds + Extremities: no edema, no cyanosis    The results of significant diagnostics from this hospitalization (including imaging, microbiology, ancillary and laboratory) are listed below for reference.     Microbiology: Recent Results (from the past 240 hour(s))  Blood Culture (routine x 2)     Status: None (Preliminary result)   Collection Time: 03/06/17  6:08 PM  Result Value Ref Range Status   Specimen Description BLOOD RIGHT ANTECUBITAL  Final   Special Requests   Final    BOTTLES DRAWN AEROBIC AND ANAEROBIC Blood Culture adequate volume   Culture NO GROWTH 2 DAYS  Final   Report Status PENDING  Incomplete  Body fluid culture     Status: None (Preliminary result)    Collection Time: 03/06/17  7:33 PM  Result Value Ref Range Status   Specimen Description FLUID SYNOVIAL RIGHT KNEE  Final   Special Requests NONE  Final   Gram Stain   Final    ABUNDANT WBC PRESENT, PREDOMINANTLY PMN NO ORGANISMS SEEN    Culture NO GROWTH 2 DAYS  Final   Report Status PENDING  Incomplete  Blood Culture (routine x 2)     Status: None (Preliminary result)   Collection Time: 03/06/17  7:50 PM  Result Value Ref Range Status   Specimen Description BLOOD LEFT ANTECUBITAL  Final   Special Requests IN PEDIATRIC BOTTLE Blood Culture adequate volume  Final   Culture NO GROWTH 2 DAYS  Final   Report Status PENDING  Incomplete  Gram stain     Status: None   Collection Time: 03/06/17  9:18 PM  Result Value Ref Range Status   Specimen Description FLUID SYNOVIAL RIGHT KNEE  Final   Special Requests NONE  Final   Gram Stain   Final    MODERATE WBC PRESENT, PREDOMINANTLY PMN NO ORGANISMS SEEN    Report Status 03/06/2017 FINAL  Final  Urine culture     Status: None   Collection Time: 03/06/17  9:51 PM  Result Value Ref Range Status   Specimen Description URINE, RANDOM  Final   Special Requests NONE  Final   Culture NO GROWTH  Final   Report Status 03/08/2017 FINAL  Final  MRSA PCR Screening     Status: None   Collection Time: 03/06/17 10:41 PM  Result Value Ref Range Status   MRSA by PCR NEGATIVE  NEGATIVE Final    Comment:        The GeneXpert MRSA Assay (FDA approved for NASAL specimens only), is one component of a comprehensive MRSA colonization surveillance program. It is not intended to diagnose MRSA infection nor to guide or monitor treatment for MRSA infections.      Labs: BNP (last 3 results) Recent Labs    12/02/16 1405 03/06/17 1800  BNP 554.8* 594.5*   Basic Metabolic Panel: Recent Labs  Lab 03/06/17 1800 03/06/17 1821 03/07/17 0458 03/08/17 0415 03/09/17 0503  NA 126* 127* 127* 127* 129*  K 4.9 5.0 4.3 4.6 4.3  CL 85* 86* 90* 89* 90*   CO2 26  --  '23 22 25  ' GLUCOSE 244* 243* 158* 381* 365*  BUN 87* 79* 91* 104* 116*  CREATININE 4.34* 4.10* 4.18* 3.80* 3.48*  CALCIUM 8.7*  --  8.2* 8.8* 9.0   Liver Function Tests: Recent Labs  Lab 03/06/17 1800 03/07/17 0458  AST 15 8*  ALT 9* 8*  ALKPHOS 78 68  BILITOT 1.5* 1.1  PROT 7.2 6.5  ALBUMIN 2.7* 2.4*   No results for input(s): LIPASE, AMYLASE in the last 168 hours. No results for input(s): AMMONIA in the last 168 hours. CBC: Recent Labs  Lab 03/06/17 1800 03/06/17 1821 03/07/17 0458 03/08/17 0415 03/09/17 0503  WBC 18.5*  --  14.6* 12.0* 11.5*  NEUTROABS 16.0  --  12.4*  --   --   HGB 10.7* 12.2* 9.7* 11.0* 11.2*  HCT 32.4* 36.0* 29.5* 32.9* 33.4*  MCV 88.8  --  88.3 88.2 87.4  PLT 266  --  243 316 377   Cardiac Enzymes: Recent Labs  Lab 03/06/17 2254 03/07/17 0458  TROPONINI 0.13* 0.11*   BNP: Invalid input(s): POCBNP CBG: Recent Labs  Lab 03/08/17 1646 03/08/17 1854 03/08/17 2143 03/09/17 0031 03/09/17 0744  GLUCAP 431* 443* 391* 415* 333*   D-Dimer No results for input(s): DDIMER in the last 72 hours. Hgb A1c Recent Labs    03/06/17 2057  HGBA1C 9.2*   Lipid Profile No results for input(s): CHOL, HDL, LDLCALC, TRIG, CHOLHDL, LDLDIRECT in the last 72 hours. Thyroid function studies No results for input(s): TSH, T4TOTAL, T3FREE, THYROIDAB in the last 72 hours.  Invalid input(s): FREET3 Anemia work up No results for input(s): VITAMINB12, FOLATE, FERRITIN, TIBC, IRON, RETICCTPCT in the last 72 hours. Urinalysis    Component Value Date/Time   COLORURINE AMBER (A) 03/06/2017 2155   APPEARANCEUR CLOUDY (A) 03/06/2017 2155   LABSPEC 1.015 03/06/2017 2155   PHURINE 5.0 03/06/2017 2155   GLUCOSEU NEGATIVE 03/06/2017 2155   HGBUR NEGATIVE 03/06/2017 2155   BILIRUBINUR NEGATIVE 03/06/2017 2155   [place] NEGATIVE 03/06/2017 2155   PROTEINUR 30 (A) 03/06/2017 2155   NITRITE NEGATIVE 03/06/2017 2155   LEUKOCYTESUR NEGATIVE  03/06/2017 2155   Sepsis Labs Invalid input(s): PROCALCITONIN,  WBC,  LACTICIDVEN Microbiology Recent Results (from the past 240 hour(s))  Blood Culture (routine x 2)     Status: None (Preliminary result)   Collection Time: 03/06/17  6:08 PM  Result Value Ref Range Status   Specimen Description BLOOD RIGHT ANTECUBITAL  Final   Special Requests   Final    BOTTLES DRAWN AEROBIC AND ANAEROBIC Blood Culture adequate volume   Culture NO GROWTH 2 DAYS  Final   Report Status PENDING  Incomplete  Body fluid culture     Status: None (Preliminary result)   Collection Time: 03/06/17  7:33 PM  Result Value Ref  Range Status   Specimen Description FLUID SYNOVIAL RIGHT KNEE  Final   Special Requests NONE  Final   Gram Stain   Final    ABUNDANT WBC PRESENT, PREDOMINANTLY PMN NO ORGANISMS SEEN    Culture NO GROWTH 2 DAYS  Final   Report Status PENDING  Incomplete  Blood Culture (routine x 2)     Status: None (Preliminary result)   Collection Time: 03/06/17  7:50 PM  Result Value Ref Range Status   Specimen Description BLOOD LEFT ANTECUBITAL  Final   Special Requests IN PEDIATRIC BOTTLE Blood Culture adequate volume  Final   Culture NO GROWTH 2 DAYS  Final   Report Status PENDING  Incomplete  Gram stain     Status: None   Collection Time: 03/06/17  9:18 PM  Result Value Ref Range Status   Specimen Description FLUID SYNOVIAL RIGHT KNEE  Final   Special Requests NONE  Final   Gram Stain   Final    MODERATE WBC PRESENT, PREDOMINANTLY PMN NO ORGANISMS SEEN    Report Status 03/06/2017 FINAL  Final  Urine culture     Status: None   Collection Time: 03/06/17  9:51 PM  Result Value Ref Range Status   Specimen Description URINE, RANDOM  Final   Special Requests NONE  Final   Culture NO GROWTH  Final   Report Status 03/08/2017 FINAL  Final  MRSA PCR Screening     Status: None   Collection Time: 03/06/17 10:41 PM  Result Value Ref Range Status   MRSA by PCR NEGATIVE NEGATIVE Final    Comment:         The GeneXpert MRSA Assay (FDA approved for NASAL specimens only), is one component of a comprehensive MRSA colonization surveillance program. It is not intended to diagnose MRSA infection nor to guide or monitor treatment for MRSA infections.      Time coordinating discharge: Over 30 minutes  SIGNED:   Debbe Odea, MD  [facility] 03/09/2017, 9:24 AM Pager   If 7PM-7AM, please contact night-coverage [email or web address] Password [email or web address]

## 2017-03-09 NOTE — Discharge Instructions (Signed)
1. You have a prescription for Prednisone. If your symptoms recur while you are cutting back on Prednisone, please see Dr [name] and have Dr [name] give you a new prescription for Prednisone.   2. Weigh your self daily. If you are gaining weight (more than 3 pounds in 24-48 hrs), resume your Lasix and continue to weigh daily.   3.You will likely need to cut back on Insulin as you decrease to dose of Prednisone. Please make sure to check your sugars at least 3 times a day prior to meals and write it down on a notebook. Also write down the amount of Insulin you give your self for each meal. Remember to take this to your doctor this Friday and at every visit.  Please take all your medications with you for your next visit with your Primary MD. Please request your Primary MD to go over all hospital test results at the follow up. Please ask your Primary MD to get all Hospital records sent to his/her office.  If you experience worsening of your admission symptoms, develop shortness of breath, chest pain, suicidal or homicidal thoughts or a life threatening emergency, you must seek medical attention immediately by calling 911 or calling your MD.  Dennis Bast must read the complete instructions/literature along with all the possible adverse reactions/side effects for all the medicines you take including new medications that have been prescribed to you. Take new medicines after you have completely understood and accpet all the possible adverse reactions/side effects.   Do not drive when taking pain medications or sedatives.    Do not take more than prescribed Pain, Sleep and Anxiety Medications  If you have smoked or chewed Tobacco in the last 2 yrs please stop. Stop any regular alcohol and or recreational drug use.  Wear Seat belts while driving.

## 2017-03-09 NOTE — Progress Notes (Signed)
Patient blood sugar 443, per NP Kirby Novolog 15 units given. Later CBG shows 391 scheduled Lantus and Novolog given again.  Will continue to monitor.  Nyx Keady, RN

## 2017-03-09 NOTE — Progress Notes (Signed)
Pt has orders to be discharged. Discharge instructions given and pt has no additional questions at this time. Medication regimen reviewed and pt educated. Pt verbalized understanding and has no additional questions. Telemetry box removed. IV removed and site in good condition. Pt stable and waiting for transportation. 

## 2017-03-11 DIAGNOSIS — Z794 Long term (current) use of insulin: Secondary | ICD-10-CM | POA: Diagnosis not present

## 2017-03-11 DIAGNOSIS — N184 Chronic kidney disease, stage 4 (severe): Secondary | ICD-10-CM | POA: Diagnosis not present

## 2017-03-11 DIAGNOSIS — L97228 Non-pressure chronic ulcer of left calf with other specified severity: Secondary | ICD-10-CM | POA: Diagnosis not present

## 2017-03-11 DIAGNOSIS — E11622 Type 2 diabetes mellitus with other skin ulcer: Secondary | ICD-10-CM | POA: Diagnosis not present

## 2017-03-11 DIAGNOSIS — M109 Gout, unspecified: Secondary | ICD-10-CM | POA: Diagnosis not present

## 2017-03-11 DIAGNOSIS — I5032 Chronic diastolic (congestive) heart failure: Secondary | ICD-10-CM | POA: Diagnosis not present

## 2017-03-11 DIAGNOSIS — I4891 Unspecified atrial fibrillation: Secondary | ICD-10-CM | POA: Diagnosis not present

## 2017-03-11 DIAGNOSIS — E1140 Type 2 diabetes mellitus with diabetic neuropathy, unspecified: Secondary | ICD-10-CM | POA: Diagnosis not present

## 2017-03-11 DIAGNOSIS — I130 Hypertensive heart and chronic kidney disease with heart failure and stage 1 through stage 4 chronic kidney disease, or unspecified chronic kidney disease: Secondary | ICD-10-CM | POA: Diagnosis not present

## 2017-03-11 DIAGNOSIS — E1122 Type 2 diabetes mellitus with diabetic chronic kidney disease: Secondary | ICD-10-CM | POA: Diagnosis not present

## 2017-03-11 DIAGNOSIS — I2510 Atherosclerotic heart disease of native coronary artery without angina pectoris: Secondary | ICD-10-CM | POA: Diagnosis not present

## 2017-03-11 DIAGNOSIS — I87332 Chronic venous hypertension (idiopathic) with ulcer and inflammation of left lower extremity: Secondary | ICD-10-CM | POA: Diagnosis not present

## 2017-03-11 DIAGNOSIS — I890 Lymphedema, not elsewhere classified: Secondary | ICD-10-CM | POA: Diagnosis not present

## 2017-03-11 DIAGNOSIS — Z7901 Long term (current) use of anticoagulants: Secondary | ICD-10-CM | POA: Diagnosis not present

## 2017-03-11 LAB — CULTURE, BLOOD (ROUTINE X 2)
Culture: NO GROWTH
Culture: NO GROWTH
SPECIAL REQUESTS: ADEQUATE
Special Requests: ADEQUATE

## 2017-03-18 ENCOUNTER — Encounter (HOSPITAL_BASED_OUTPATIENT_CLINIC_OR_DEPARTMENT_OTHER): Payer: BLUE CROSS/BLUE SHIELD | Attending: Internal Medicine

## 2017-03-18 DIAGNOSIS — I130 Hypertensive heart and chronic kidney disease with heart failure and stage 1 through stage 4 chronic kidney disease, or unspecified chronic kidney disease: Secondary | ICD-10-CM | POA: Insufficient documentation

## 2017-03-18 DIAGNOSIS — N189 Chronic kidney disease, unspecified: Secondary | ICD-10-CM | POA: Insufficient documentation

## 2017-03-18 DIAGNOSIS — E11622 Type 2 diabetes mellitus with other skin ulcer: Secondary | ICD-10-CM | POA: Insufficient documentation

## 2017-03-18 DIAGNOSIS — E1140 Type 2 diabetes mellitus with diabetic neuropathy, unspecified: Secondary | ICD-10-CM | POA: Diagnosis not present

## 2017-03-18 DIAGNOSIS — I87332 Chronic venous hypertension (idiopathic) with ulcer and inflammation of left lower extremity: Secondary | ICD-10-CM | POA: Insufficient documentation

## 2017-03-18 DIAGNOSIS — E1122 Type 2 diabetes mellitus with diabetic chronic kidney disease: Secondary | ICD-10-CM | POA: Insufficient documentation

## 2017-03-18 DIAGNOSIS — L97822 Non-pressure chronic ulcer of other part of left lower leg with fat layer exposed: Secondary | ICD-10-CM | POA: Insufficient documentation

## 2017-03-18 DIAGNOSIS — I2510 Atherosclerotic heart disease of native coronary artery without angina pectoris: Secondary | ICD-10-CM | POA: Insufficient documentation

## 2017-03-18 DIAGNOSIS — I509 Heart failure, unspecified: Secondary | ICD-10-CM | POA: Insufficient documentation

## 2017-03-18 DIAGNOSIS — I890 Lymphedema, not elsewhere classified: Secondary | ICD-10-CM | POA: Insufficient documentation

## 2017-03-25 DIAGNOSIS — I87332 Chronic venous hypertension (idiopathic) with ulcer and inflammation of left lower extremity: Secondary | ICD-10-CM | POA: Diagnosis not present

## 2017-03-28 ENCOUNTER — Encounter (HOSPITAL_COMMUNITY): Payer: Self-pay | Admitting: Emergency Medicine

## 2017-03-28 ENCOUNTER — Inpatient Hospital Stay (HOSPITAL_COMMUNITY)
Admission: EM | Admit: 2017-03-28 | Discharge: 2017-03-31 | DRG: 308 | Disposition: A | Discharge status: Home / Self Care | Payer: BLUE CROSS/BLUE SHIELD | Attending: Internal Medicine | Admitting: Internal Medicine

## 2017-03-28 DIAGNOSIS — E1122 Type 2 diabetes mellitus with diabetic chronic kidney disease: Secondary | ICD-10-CM | POA: Diagnosis present

## 2017-03-28 DIAGNOSIS — G4733 Obstructive sleep apnea (adult) (pediatric): Secondary | ICD-10-CM | POA: Diagnosis present

## 2017-03-28 DIAGNOSIS — I480 Paroxysmal atrial fibrillation: Secondary | ICD-10-CM | POA: Diagnosis not present

## 2017-03-28 DIAGNOSIS — I481 Persistent atrial fibrillation: Secondary | ICD-10-CM | POA: Diagnosis present

## 2017-03-28 DIAGNOSIS — R001 Bradycardia, unspecified: Principal | ICD-10-CM | POA: Diagnosis present

## 2017-03-28 DIAGNOSIS — D631 Anemia in chronic kidney disease: Secondary | ICD-10-CM | POA: Diagnosis present

## 2017-03-28 DIAGNOSIS — I248 Other forms of acute ischemic heart disease: Secondary | ICD-10-CM | POA: Diagnosis present

## 2017-03-28 DIAGNOSIS — N184 Chronic kidney disease, stage 4 (severe): Secondary | ICD-10-CM | POA: Diagnosis present

## 2017-03-28 DIAGNOSIS — E11622 Type 2 diabetes mellitus with other skin ulcer: Secondary | ICD-10-CM | POA: Diagnosis present

## 2017-03-28 DIAGNOSIS — E1129 Type 2 diabetes mellitus with other diabetic kidney complication: Secondary | ICD-10-CM

## 2017-03-28 DIAGNOSIS — L97929 Non-pressure chronic ulcer of unspecified part of left lower leg with unspecified severity: Secondary | ICD-10-CM | POA: Diagnosis present

## 2017-03-28 DIAGNOSIS — I130 Hypertensive heart and chronic kidney disease with heart failure and stage 1 through stage 4 chronic kidney disease, or unspecified chronic kidney disease: Secondary | ICD-10-CM | POA: Diagnosis present

## 2017-03-28 DIAGNOSIS — Z794 Long term (current) use of insulin: Secondary | ICD-10-CM | POA: Diagnosis not present

## 2017-03-28 DIAGNOSIS — I4819 Other persistent atrial fibrillation: Secondary | ICD-10-CM | POA: Diagnosis present

## 2017-03-28 DIAGNOSIS — I4892 Unspecified atrial flutter: Secondary | ICD-10-CM | POA: Diagnosis present

## 2017-03-28 DIAGNOSIS — E872 Acidosis, unspecified: Secondary | ICD-10-CM

## 2017-03-28 DIAGNOSIS — Z79899 Other long term (current) drug therapy: Secondary | ICD-10-CM | POA: Diagnosis not present

## 2017-03-28 DIAGNOSIS — Z6841 Body Mass Index (BMI) 40.0 and over, adult: Secondary | ICD-10-CM

## 2017-03-28 DIAGNOSIS — I428 Other cardiomyopathies: Secondary | ICD-10-CM | POA: Diagnosis present

## 2017-03-28 DIAGNOSIS — I5043 Acute on chronic combined systolic (congestive) and diastolic (congestive) heart failure: Secondary | ICD-10-CM | POA: Diagnosis present

## 2017-03-28 DIAGNOSIS — M069 Rheumatoid arthritis, unspecified: Secondary | ICD-10-CM | POA: Diagnosis present

## 2017-03-28 DIAGNOSIS — Z7901 Long term (current) use of anticoagulants: Secondary | ICD-10-CM | POA: Diagnosis not present

## 2017-03-28 DIAGNOSIS — M1120 Other chondrocalcinosis, unspecified site: Secondary | ICD-10-CM | POA: Diagnosis present

## 2017-03-28 DIAGNOSIS — IMO0001 Reserved for inherently not codable concepts without codable children: Secondary | ICD-10-CM

## 2017-03-28 LAB — I-STAT CG4 LACTIC ACID, ED: Lactic Acid, Venous: 1.7 mmol/L (ref 0.5–1.9)

## 2017-03-28 LAB — CBC WITH DIFFERENTIAL/PLATELET
BASOS ABS: 0 10*3/uL (ref 0.0–0.1)
BASOS PCT: 0 %
EOS ABS: 0.1 10*3/uL (ref 0.0–0.7)
Eosinophils Relative: 1 %
HCT: 29.7 % — ABNORMAL LOW (ref 39.0–52.0)
HEMOGLOBIN: 9.5 g/dL — AB (ref 13.0–17.0)
Lymphocytes Relative: 16 %
Lymphs Abs: 1.4 10*3/uL (ref 0.7–4.0)
MCH: 28.8 pg (ref 26.0–34.0)
MCHC: 32 g/dL (ref 30.0–36.0)
MCV: 90 fL (ref 78.0–100.0)
Monocytes Absolute: 0.5 10*3/uL (ref 0.1–1.0)
Monocytes Relative: 6 %
Neutro Abs: 6.7 10*3/uL (ref 1.7–7.7)
Neutrophils Relative %: 77 %
Platelets: 159 10*3/uL (ref 150–400)
RBC: 3.3 MIL/uL — AB (ref 4.22–5.81)
RDW: 16.9 % — ABNORMAL HIGH (ref 11.5–15.5)
WBC: 8.7 10*3/uL (ref 4.0–10.5)

## 2017-03-28 LAB — BASIC METABOLIC PANEL
Anion gap: 11 (ref 5–15)
BUN: 57 mg/dL — AB (ref 6–20)
CHLORIDE: 104 mmol/L (ref 101–111)
CO2: 20 mmol/L — ABNORMAL LOW (ref 22–32)
Calcium: 8.4 mg/dL — ABNORMAL LOW (ref 8.9–10.3)
Creatinine, Ser: 2.78 mg/dL — ABNORMAL HIGH (ref 0.61–1.24)
GFR calc Af Amer: 30 mL/min — ABNORMAL LOW (ref >60)
GFR calc non Af Amer: 26 mL/min — ABNORMAL LOW (ref >60)
GLUCOSE: 108 mg/dL — AB (ref 65–99)
POTASSIUM: 4.5 mmol/L (ref 3.5–5.1)
Sodium: 135 mmol/L (ref 135–145)

## 2017-03-28 LAB — I-STAT VENOUS BLOOD GAS, ED
ACID-BASE DEFICIT: 5 mmol/L — AB (ref 0.0–2.0)
BICARBONATE: 22.3 mmol/L (ref 20.0–28.0)
O2 SAT: 31 %
PO2 VEN: 23 mmHg — AB (ref 32.0–45.0)
Patient temperature: 37
TCO2: 24 mmol/L (ref 22–32)
pCO2, Ven: 51.8 mmHg (ref 44.0–60.0)
pH, Ven: 7.241 — ABNORMAL LOW (ref 7.250–7.430)

## 2017-03-28 LAB — URINALYSIS, ROUTINE W REFLEX MICROSCOPIC
BILIRUBIN URINE: NEGATIVE
GLUCOSE, UA: NEGATIVE mg/dL
KETONES UR: NEGATIVE mg/dL
NITRITE: NEGATIVE
PH: 5 (ref 5.0–8.0)
Protein, ur: 100 mg/dL — AB
Specific Gravity, Urine: 1.019 (ref 1.005–1.030)

## 2017-03-28 LAB — I-STAT CHEM 8, ED
BUN: 43 mg/dL — AB (ref 6–20)
CHLORIDE: 108 mmol/L (ref 101–111)
Calcium, Ion: 1.07 mmol/L — ABNORMAL LOW (ref 1.15–1.40)
Creatinine, Ser: 2.6 mg/dL — ABNORMAL HIGH (ref 0.61–1.24)
Glucose, Bld: 93 mg/dL (ref 65–99)
HCT: 27 % — ABNORMAL LOW (ref 39.0–52.0)
Hemoglobin: 9.2 g/dL — ABNORMAL LOW (ref 13.0–17.0)
Potassium: 3.9 mmol/L (ref 3.5–5.1)
SODIUM: 140 mmol/L (ref 135–145)
TCO2: 21 mmol/L — ABNORMAL LOW (ref 22–32)

## 2017-03-28 LAB — COMPREHENSIVE METABOLIC PANEL
ALBUMIN: 2.4 g/dL — AB (ref 3.5–5.0)
ALK PHOS: 116 U/L (ref 38–126)
ALT: 27 U/L (ref 17–63)
ANION GAP: 10 (ref 5–15)
AST: 39 U/L (ref 15–41)
BUN: 54 mg/dL — ABNORMAL HIGH (ref 6–20)
CO2: 19 mmol/L — AB (ref 22–32)
Calcium: 7.8 mg/dL — ABNORMAL LOW (ref 8.9–10.3)
Chloride: 106 mmol/L (ref 101–111)
Creatinine, Ser: 2.78 mg/dL — ABNORMAL HIGH (ref 0.61–1.24)
GFR calc Af Amer: 30 mL/min — ABNORMAL LOW (ref >60)
GFR calc non Af Amer: 26 mL/min — ABNORMAL LOW (ref >60)
GLUCOSE: 107 mg/dL — AB (ref 65–99)
Potassium: 4.2 mmol/L (ref 3.5–5.1)
SODIUM: 135 mmol/L (ref 135–145)
Total Bilirubin: 0.8 mg/dL (ref 0.3–1.2)
Total Protein: 5.3 g/dL — ABNORMAL LOW (ref 6.5–8.1)

## 2017-03-28 LAB — SALICYLATE LEVEL: Salicylate Lvl: <7 mg/dL (ref 2.8–30.0)

## 2017-03-28 LAB — MAGNESIUM: Magnesium: 1.5 mg/dL — ABNORMAL LOW (ref 1.7–2.4)

## 2017-03-28 LAB — TSH: TSH: 0.461 u[IU]/mL (ref 0.350–4.500)

## 2017-03-28 LAB — I-STAT TROPONIN, ED: Troponin i, poc: 0.07 ng/mL (ref 0.00–0.08)

## 2017-03-28 LAB — TROPONIN I: Troponin I: 0.06 ng/mL (ref <0.03)

## 2017-03-28 LAB — CBG MONITORING, ED
Glucose-Capillary: 109 mg/dL — ABNORMAL HIGH (ref 65–99)
Glucose-Capillary: 115 mg/dL — ABNORMAL HIGH (ref 65–99)

## 2017-03-28 MED ORDER — ACETAMINOPHEN 325 MG PO TABS
650.0000 mg | ORAL_TABLET | Freq: Four times a day (QID) | ORAL | Status: DC | PRN
  Administered 2017-03-29 – 2017-03-30 (×2): 650 mg via ORAL
  Filled 2017-03-28 (×3): qty 2

## 2017-03-28 MED ORDER — SODIUM CHLORIDE 0.9 % IV BOLUS (SEPSIS)
1000.0000 mL | Freq: Once | INTRAVENOUS | Status: AC
Start: 2017-03-28 — End: 2017-03-28
  Administered 2017-03-28: 1000 mL via INTRAVENOUS

## 2017-03-28 MED ORDER — FLUTICASONE PROPIONATE 50 MCG/ACT NA SUSP
2.0000 | Freq: Every day | NASAL | Status: DC
  Filled 2017-03-28: qty 16

## 2017-03-28 MED ORDER — ATROPINE SULFATE 1 MG/10ML IJ SOSY
1.0000 mg | PREFILLED_SYRINGE | Freq: Once | INTRAMUSCULAR | Status: AC
  Administered 2017-03-28: 1 mg via INTRAVENOUS
  Filled 2017-03-28: qty 10

## 2017-03-28 MED ORDER — ACETAMINOPHEN 650 MG RE SUPP: 650.0000 mg | Freq: Four times a day (QID) | RECTAL | Status: DC | PRN

## 2017-03-28 MED ORDER — RIVAROXABAN 20 MG PO TABS: 20.0000 mg | ORAL_TABLET | Freq: Every day | ORAL | Status: DC

## 2017-03-28 MED ORDER — INSULIN ASPART 100 UNIT/ML ~~LOC~~ SOLN
0.0000 [IU] | Freq: Three times a day (TID) | SUBCUTANEOUS | Status: DC
  Administered 2017-03-29 – 2017-03-30 (×3): 2 [IU] via SUBCUTANEOUS
  Administered 2017-03-30: 1 [IU] via SUBCUTANEOUS
  Administered 2017-03-31: 2 [IU] via SUBCUTANEOUS

## 2017-03-28 MED ORDER — STERILE WATER FOR INJECTION IV SOLN
INTRAVENOUS | Status: DC
  Administered 2017-03-28 – 2017-03-29 (×2): via INTRAVENOUS
  Filled 2017-03-28 (×5): qty 850

## 2017-03-28 MED ORDER — ALBUTEROL SULFATE (2.5 MG/3ML) 0.083% IN NEBU: 3.0000 mL | INHALATION_SOLUTION | Freq: Four times a day (QID) | RESPIRATORY_TRACT | Status: DC | PRN

## 2017-03-28 MED ORDER — GABAPENTIN 300 MG PO CAPS
300.0000 mg | ORAL_CAPSULE | Freq: Two times a day (BID) | ORAL | Status: DC
  Administered 2017-03-29 – 2017-03-31 (×5): 300 mg via ORAL
  Filled 2017-03-28 (×5): qty 1

## 2017-03-28 NOTE — ED Notes (Signed)
Pt placed on hospital bed, given new warm blankets, encouraged to rest; will continue to monitor

## 2017-03-28 NOTE — ED Notes (Signed)
Admitting MD at bedside.

## 2017-03-28 NOTE — ED Notes (Signed)
Pt given urinal and instructed to provide urine sample

## 2017-03-28 NOTE — ED Triage Notes (Signed)
Pt presents from home with [facility] initially for vomiting, then found to be bradycardic; pt reports not eaten today; hx of CHF and ESRD but not dialysis

## 2017-03-28 NOTE — ED Provider Notes (Signed)
[facility] EMERGENCY DEPARTMENT Provider Note   CSN: [id number] Arrival date & time: 03/28/17  1847     History   Chief Complaint Chief Complaint  Patient presents with  . Bradycardia  . Emesis    HPI John Bailey is a 47 y.o. male with a complicated past medical history including stage IV chronic kidney disease, insulin-dependent diabetes, hypertension, on chronic anticoagulation with Xarelto for chronic atrial fibrillation. Hx of systolic CHF with EF of 26%. Patient states that he has not eaten all day.  He had onset of vomiting and this is why his wife called out EMS.  She states that she thought he might have had a seizure.  He was found to have heart rate in the 20s and 30s with intermittent syncope prior to arrival.  He started on epinephrine drip which was stopped here in the emergency department.  He states that he has not been sick in the last several days.  He denies fevers, chills, abdominal pain.  He denies any nausea at this point.  Patient takes diltiazem.  He did vomit up all of his medications today.   HPI  Past Medical History:  Diagnosis Date  . Atrial flutter with rapid ventricular response ([place]) 08/06/2015   a. s/p DCCV in 07/2015 with recurrent atrial fibrillation and DCCV in 12/2016. Initially successful but noted to be back in atrial fibrillation within 2 weeks of DCCV.   [name] Chronic diastolic (congestive) heart failure (HCC)    a. EF 50-55% by echo in 06/2016.  . Diabetes ([place])   . Hypertension     Patient Active Problem List   Diagnosis Date Noted  . Bradycardia 03/28/2017  . CKD (chronic kidney disease) stage 4, GFR 15-29 ml/min (HCC) 03/28/2017  . Pseudogout 03/09/2017  . Rheumatoid arthritis ([place]) 03/09/2017  . Infected ulcer of skin, with fat layer exposed ([place]) 03/09/2017  . Normocytic anemia 03/06/2017  . Elevated troponin 03/06/2017  . Acute renal failure superimposed on stage 4 chronic kidney disease ([place]) 03/06/2017  .  Essential hypertension 12/29/2016  . Persistent atrial fibrillation ([place])   . Left leg cellulitis   . Atrial fibrillation with RVR ([place]) 12/02/2016  . Sepsis ([place]) 12/02/2016  . Acute on chronic congestive heart failure ([place])   . Acute on chronic renal insufficiency   . Acute on chronic diastolic CHF (congestive heart failure) ([place]) 03/20/2016  . Nonischemic cardiomyopathy ([place]) 11/11/2015  . Long term (current) use of anticoagulants [Z79.01] 08/14/2015  . Insulin-dependent diabetes mellitus with renal complications ([place]) 71/24/5809  . Atrial flutter with rapid ventricular response ([place]) 08/06/2015  . Hyponatremia 07/30/2015  . AKI (acute kidney injury) ([place]) 07/30/2015  . Hyperkalemia 07/30/2015  . Acute on chronic combined systolic and diastolic CHF (congestive heart failure) ([place]) 07/30/2015  . Morbid obesity ([place]) 07/30/2015  . Paroxysmal atrial fibrillation ([place]) 07/30/2015  . 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 Hypertensive heart disease with heart failure ([place]) 07/14/2012  . Dyspnea 07/14/2012  . Hyperlipidemia 07/14/2012    Past Surgical History:  Procedure Laterality Date  . CARDIOVERSION N/A 08/01/2015   Procedure: CARDIOVERSION;  Surgeon: Josue Hector, MD;  Location: [facility] ENDOSCOPY;  Service: Cardiovascular;  Laterality: N/A;  . CARDIOVERSION N/A 12/18/2016   Procedure: CARDIOVERSION;  Surgeon: [name], MD;  Location: [facility];  Service: Cardiovascular;  Laterality: N/A;  . CHOLECYSTECTOMY    . TEE WITHOUT CARDIOVERSION N/A 08/01/2015   Procedure: TRANSESOPHAGEAL ECHOCARDIOGRAM (TEE);  Surgeon: Collier Salina  Tommy Rainwater, MD;  Location: [facility] ENDOSCOPY;  Service: Cardiovascular;  Laterality: N/A;       Home Medications    Prior to Admission medications   Medication Sig Start Date End Date Taking? Authorizing Provider  acetaminophen-codeine (TYLENOL #3) 300-30 MG tablet Take 1 tablet by mouth every 8 (eight) hours as needed for  moderate pain or severe pain.  11/26/16  Yes [provider]  albuterol (PROVENTIL HFA;VENTOLIN HFA) 108 (90 BASE) MCG/ACT inhaler Inhale 1-2 puffs into the lungs every 6 (six) hours as needed for wheezing. 05/30/14  Yes Nahser, Wonda Cheng, MD  benzonatate (TESSALON) 100 MG capsule Take 1 capsule (100 mg total) by mouth 3 (three) times daily as needed for cough. 12/16/16  Yes Barton Dubois, MD  carvedilol (COREG) 25 MG tablet Take 25 mg by mouth 2 (two) times daily. 03/24/17  Yes [provider]  celecoxib (CELEBREX) 100 MG capsule Take 100 mg by mouth daily. 03/04/17  Yes [provider]  colchicine 0.6 MG tablet Take 0.5 tablets (0.3 mg total) by mouth daily. 12/16/16  Yes Barton Dubois, MD  diltiazem (CARDIZEM CD) 360 MG 24 hr capsule Take 1 capsule (360 mg total) by mouth daily. 12/29/16  Yes Strader, [place], PA-C  fluticasone (FLONASE) 50 MCG/ACT nasal spray Place 2 sprays into the nose daily.  12/29/12  Yes [provider]  furosemide (LASIX) 40 MG tablet Take 40 mg by mouth 2 (two) times daily. 03/24/17  Yes [provider]  gabapentin (NEURONTIN) 300 MG capsule Take 1 capsule (300 mg total) by mouth 2 (two) times daily. 03/09/17  Yes Debbe Odea, MD  insulin aspart (NOVOLOG FLEXPEN) 100 UNIT/ML FlexPen Inject 0-12 Units into the skin 3 (three) times daily as needed for high blood sugar. Sliding Scale <70, initiate hypoglycemia protocol, 70-100=0 units, 110-199= 3 units, 200-250=5 units, 251-300=7 units, 301-350=10 units, 351-400=12 units, >400=14 units. Call MD if >450 03/09/17  Yes Debbe Odea, MD  Insulin Glargine (LANTUS) 100 UNIT/ML Solostar Pen Inject 25 Units into the skin daily at 10 pm. 03/09/17  Yes Rizwan, Eunice Blase, MD  potassium chloride SA (KLOR-CON M20) 20 MEQ tablet Take 1 tablet (20 mEq total) by mouth daily. 08/19/15  Yes Hilty, Nadean Corwin, MD  rivaroxaban (XARELTO) 20 MG TABS tablet take 1 tablet by mouth once daily WITH SUPPER Patient  taking differently: Take 20 mg by mouth at bedtime.  10/12/16  Yes Hilty, Nadean Corwin, MD  amoxicillin-clavulanate (AUGMENTIN) 875-125 MG tablet Take 1 tablet by mouth 2 (two) times daily. Patient not taking: Reported on 03/28/2017 03/09/17   Debbe Odea, MD  carvedilol (COREG) 12.5 MG tablet Take 3 tablets (37.5 mg total) by mouth 2 (two) times daily with a meal. Patient not taking: Reported on 03/28/2017 12/16/16   Barton Dubois, MD  doxycycline (VIBRA-TABS) 100 MG tablet Take 1 tablet (100 mg total) by mouth every 12 (twelve) hours. Patient not taking: Reported on 03/28/2017 03/09/17   Debbe Odea, MD  insulin aspart (NOVOLOG PENFILL) cartridge Inject 5 Units into the skin 3 (three) times daily with meals. Patient not taking: Reported on 03/06/2017 08/08/15   Charlynne Cousins, MD  Insulin Pen Needle 31G X 6 MM MISC 1 Device by Does not apply route 2 (two) times daily. Patient not taking: Reported on 03/28/2017 08/08/15   Charlynne Cousins, MD  predniSONE (DELTASONE) 10 MG tablet 60 mg x 3 days 40 mg x 3 days 20 mg x 3 days 10 mg x 3 day 5  mg x 3 days Patient not taking: Reported on 03/28/2017 03/09/17   Debbe Odea, MD    Family History Family History  Problem Relation Age of Onset  . Heart disease Mother   . Hyperlipidemia Father   . Hypertension Father     Social History Social History   Tobacco Use  . Smoking status: Never Smoker  . Smokeless tobacco: Never Used  Substance Use Topics  . Alcohol use: No  . Drug use: No     Allergies   Patient has no known allergies.   Review of Systems Review of Systems   Physical Exam Updated Vital Signs BP 116/80   Pulse 64   Temp (!) 96 F (35.6 C) (Temporal)   Resp 11   SpO2 100%   Physical Exam   ED Treatments / Results  Labs (all labs ordered are listed, but only abnormal results are displayed) Labs Reviewed  CBC WITH DIFFERENTIAL/PLATELET - Abnormal; Notable for the following components:      Result Value    RBC 3.30 (*)    Hemoglobin 9.5 (*)    HCT 29.7 (*)    RDW 16.9 (*)    All other components within normal limits  COMPREHENSIVE METABOLIC PANEL - Abnormal; Notable for the following components:   CO2 19 (*)    Glucose, Bld 107 (*)    BUN 54 (*)    Creatinine, Ser 2.78 (*)    Calcium 7.8 (*)    Total Protein 5.3 (*)    Albumin 2.4 (*)    GFR calc non Af Amer 26 (*)    GFR calc Af Amer 30 (*)    All other components within normal limits  URINALYSIS, ROUTINE W REFLEX MICROSCOPIC - Abnormal; Notable for the following components:   Color, Urine AMBER (*)    APPearance CLOUDY (*)    Hgb urine dipstick LARGE (*)    Protein, ur 100 (*)    Leukocytes, UA MODERATE (*)    Bacteria, UA MANY (*)    Squamous Epithelial / LPF 6-30 (*)    All other components within normal limits  MAGNESIUM - Abnormal; Notable for the following components:   Magnesium 1.5 (*)    All other components within normal limits  I-STAT CHEM 8, ED - Abnormal; Notable for the following components:   BUN 43 (*)    Creatinine, Ser 2.60 (*)    Calcium, Ion 1.07 (*)    TCO2 21 (*)    Hemoglobin 9.2 (*)    HCT 27.0 (*)    All other components within normal limits  CBG MONITORING, ED - Abnormal; Notable for the following components:   Glucose-Capillary 109 (*)    All other components within normal limits  I-STAT VENOUS BLOOD GAS, ED - Abnormal; Notable for the following components:   pH, Ven 7.241 (*)    pO2, Ven 23.0 (*)    Acid-base deficit 5.0 (*)    All other components within normal limits  SALICYLATE LEVEL  BASIC METABOLIC PANEL  BASIC METABOLIC PANEL  BASIC METABOLIC PANEL  TROPONIN I  TSH  CBC WITH DIFFERENTIAL/PLATELET  HEPATIC FUNCTION PANEL  I-STAT TROPONIN, ED  I-STAT CG4 LACTIC ACID, ED    EKG  EKG Interpretation None       Radiology No results found.  Procedures .Critical Care Performed by: Margarita Mail, PA-C Authorized by: Margarita Mail, PA-C   Critical care provider  statement:    Critical care time (minutes):  45   Critical  care was necessary to treat or prevent imminent or life-threatening deterioration of the following conditions:  Cardiac failure (bradycardia)   Critical care was time spent personally by me on the following activities:  Development of treatment plan with patient or surrogate, discussions with consultants, discussions with primary provider, evaluation of patient's response to treatment, interpretation of cardiac output measurements, review of old charts, re-evaluation of patient's condition, ordering and performing treatments and interventions, ordering and review of laboratory studies and ordering and review of radiographic studies   (including critical care time)  Medications Ordered in ED Medications  sodium bicarbonate 150 mEq in sterile water 1,000 mL infusion ( Intravenous New Bag/Given 03/28/17 2219)  albuterol (PROVENTIL HFA;VENTOLIN HFA) 108 (90 Base) MCG/ACT inhaler 1-2 puff (not administered)  fluticasone (FLONASE) 50 MCG/ACT nasal spray 2 spray (not administered)  gabapentin (NEURONTIN) capsule 300 mg (not administered)  acetaminophen (TYLENOL) tablet 650 mg (not administered)    Or  acetaminophen (TYLENOL) suppository 650 mg (not administered)  insulin aspart (novoLOG) injection 0-9 Units (not administered)  rivaroxaban (XARELTO) tablet 20 mg (not administered)  atropine 1 MG/10ML injection 1 mg (1 mg Intravenous Given 03/28/17 1903)  sodium chloride 0.9 % bolus 1,000 mL (1,000 mLs Intravenous New Bag/Given 03/28/17 2006)     Initial Impression / Assessment and Plan / ED Course  I have reviewed the triage vital signs and the nursing notes.  Pertinent labs & imaging results that were available during my care of the patient were reviewed by me and considered in my medical decision making (see chart for details).  Clinical Course as of Mar 28 2221  Sun Mar 28, 2017  1851 Cbg 109  [AH]    Clinical Course User Index [AH]  Margarita Mail, PA-C   Patient here with symptomatic bradycardia.  Given atropine upon initial evaluation with improvement in his heart rate and blood pressures.  Patient does have some metabolic disarray with a  non-anion gap metabolic acidosis.  We consulted with critical care who asked [place] to start a bicarb drip.  He does not appear to have DKA.  Patient's may have had some vasovagal event secondary to vomiting earlier.  Patient will be admitted by the hospitalist service.  Seen and shared visit with Dr. Laverta Baltimore.  He is improved throughout his ED course.  Final Clinical Impressions(s) / ED Diagnoses   Final diagnoses:  None    ED Discharge Orders    None       Margarita Mail, PA-C 03/28/17 2221    Margarita Mail, PA-C 03/28/17 2225    Margette Fast, MD 03/29/17 1005

## 2017-03-28 NOTE — H&P (Addendum)
History and Physical    John Bailey DDU:[id number] DOB: February 23, 1971 DOA: 03/28/2017  PCP: [name], MD  Patient coming from: Home.  Chief Complaint: Almost passed out.  HPI: John Bailey is a 47 y.o. male with history of chronic diastolic CHF, chronic kidney disease stage IV, rheumatoid arthritis/pseudogout, diabetes mellitus, persistent atrial fibrillation and chronic left lower extremity wound was recently admitted for arthritis and was placed on steroid and is off steroids for last few days was brought to the ER after patient had a near syncopal episode and was found to be bradycardic.  As per the patient patient has not had any food since morning today.  In the evening around 5:00 he took his NovoLog pre-meal dose and was waiting for the food when suddenly he felt nauseated and started vomiting.  His wife noticed that he almost passed out but did not lose consciousness.  This lasted for almost less than a minute during which he did not have any incontinence of urine or tongue bite.  Patient remembers the episode.  The same thing happened again after half an hour and EMS was called.  When EMS arrived patient was found to be bradycardic and was placed on epinephrine infusion.  Which is soon discontinued.  ED Course: In the ER patient became again bradycardic and cardiology was consulted and patient was given 1 dose of atropine following which heart rate improved.  Patient denies any chest pain shortness of breath abdominal pain or any diarrhea.  Patient is being admitted for further management of symptomatic bradycardia and nausea vomiting.  Review of Systems: As per HPI, rest all negative.   Past Medical History:  Diagnosis Date  . Atrial flutter with rapid ventricular response ([place]) 08/06/2015   a. s/p DCCV in 07/2015 with recurrent atrial fibrillation and DCCV in 12/2016. Initially successful but noted to be back in atrial fibrillation within 2 weeks of DCCV.   [name] Chronic  diastolic (congestive) heart failure (HCC)    a. EF 50-55% by echo in 06/2016.  . Diabetes ([place])   . Hypertension     Past Surgical History:  Procedure Laterality Date  . CARDIOVERSION N/A 08/01/2015   Procedure: CARDIOVERSION;  Surgeon: Josue Hector, MD;  Location[facility] ENDOSCOPY;  Service: Cardiovascular;  Laterality: N/A;  . CARDIOVERSION N/A 12/18/2016   Procedure: CARDIOVERSION;  Surgeon: [name], MD;  Location: [place];  Service: Cardiovascular;  Laterality: N/A;  . CHOLECYSTECTOMY    . TEE WITHOUT CARDIOVERSION N/A 08/01/2015   Procedure: TRANSESOPHAGEAL ECHOCARDIOGRAM (TEE);  Surgeon: Josue Hector, MD;  Location: [facility] ENDOSCOPY;  Service: Cardiovascular;  Laterality: N/A;     reports that  has never smoked. he has never used smokeless tobacco. He reports that he does not drink alcohol or use drugs.  No Known Allergies  Family History  Problem Relation Age of Onset  . Heart disease Mother   . Hyperlipidemia Father   . Hypertension Father     Prior to Admission medications   Medication Sig Start Date End Date Taking? Authorizing Provider  acetaminophen-codeine (TYLENOL #3) 300-30 MG tablet Take 1 tablet by mouth every 8 (eight) hours as needed for moderate pain or severe pain.  11/26/16  Yes [provider]  albuterol (PROVENTIL HFA;VENTOLIN HFA) 108 (90 BASE) MCG/ACT inhaler Inhale 1-2 puffs into the lungs every 6 (six) hours as needed for wheezing. 05/30/14  Yes Nahser, Wonda Cheng, MD  benzonatate (TESSALON) 100 MG capsule Take 1 capsule (100 mg  total) by mouth 3 (three) times daily as needed for cough. 12/16/16  Yes Barton Dubois, MD  carvedilol (COREG) 25 MG tablet Take 25 mg by mouth 2 (two) times daily. 03/24/17  Yes [provider]  celecoxib (CELEBREX) 100 MG capsule Take 100 mg by mouth daily. 03/04/17  Yes [provider]  colchicine 0.6 MG tablet Take 0.5 tablets (0.3 mg total) by mouth daily. 12/16/16  Yes Barton Dubois, MD    diltiazem (CARDIZEM CD) 360 MG 24 hr capsule Take 1 capsule (360 mg total) by mouth daily. 12/29/16  Yes Strader, Mather, PA-C  fluticasone (FLONASE) 50 MCG/ACT nasal spray Place 2 sprays into the nose daily.  12/29/12  Yes [provider]  furosemide (LASIX) 40 MG tablet Take 40 mg by mouth 2 (two) times daily. 03/24/17  Yes [provider]  gabapentin (NEURONTIN) 300 MG capsule Take 1 capsule (300 mg total) by mouth 2 (two) times daily. 03/09/17  Yes Debbe Odea, MD  insulin aspart (NOVOLOG FLEXPEN) 100 UNIT/ML FlexPen Inject 0-12 Units into the skin 3 (three) times daily as needed for high blood sugar. Sliding Scale <70, initiate hypoglycemia protocol, 70-100=0 units, 110-199= 3 units, 200-250=5 units, 251-300=7 units, 301-350=10 units, 351-400=12 units, >400=14 units. Call MD if >450 03/09/17  Yes Debbe Odea, MD  Insulin Glargine (LANTUS) 100 UNIT/ML Solostar Pen Inject 25 Units into the skin daily at 10 pm. 03/09/17  Yes Rizwan, Eunice Blase, MD  potassium chloride SA (KLOR-CON M20) 20 MEQ tablet Take 1 tablet (20 mEq total) by mouth daily. 08/19/15  Yes Hilty, Nadean Corwin, MD  rivaroxaban (XARELTO) 20 MG TABS tablet take 1 tablet by mouth once daily WITH SUPPER Patient taking differently: Take 20 mg by mouth at bedtime.  10/12/16  Yes Hilty, Nadean Corwin, MD  amoxicillin-clavulanate (AUGMENTIN) 875-125 MG tablet Take 1 tablet by mouth 2 (two) times daily. Patient not taking: Reported on 03/28/2017 03/09/17   Debbe Odea, MD  carvedilol (COREG) 12.5 MG tablet Take 3 tablets (37.5 mg total) by mouth 2 (two) times daily with a meal. Patient not taking: Reported on 03/28/2017 12/16/16   Barton Dubois, MD  doxycycline (VIBRA-TABS) 100 MG tablet Take 1 tablet (100 mg total) by mouth every 12 (twelve) hours. Patient not taking: Reported on 03/28/2017 03/09/17   Debbe Odea, MD  insulin aspart (NOVOLOG PENFILL) cartridge Inject 5 Units into the skin 3 (three) times daily with  meals. Patient not taking: Reported on 03/06/2017 08/08/15   Charlynne Cousins, MD  Insulin Pen Needle 31G X 6 MM MISC 1 Device by Does not apply route 2 (two) times daily. Patient not taking: Reported on 03/28/2017 08/08/15   Charlynne Cousins, MD  predniSONE (DELTASONE) 10 MG tablet 60 mg x 3 days 40 mg x 3 days 20 mg x 3 days 10 mg x 3 day 5 mg x 3 days Patient not taking: Reported on 03/28/2017 03/09/17   Debbe Odea, MD    Physical Exam: Vitals:   03/28/17 2015 03/28/17 2030 03/28/17 2045 03/28/17 2115  BP: 103/82 103/83 100/76 (!) 103/92  Pulse: 77 66 76 65  Resp: 18 12 17 10   Temp:      TempSrc:      SpO2: 100% 97% 98% 100%      Constitutional: Moderately built and nourished. Vitals:   03/28/17 2015 03/28/17 2030 03/28/17 2045 03/28/17 2115  BP: 103/82 103/83 100/76 (!) 103/92  Pulse: 77 66 76 65  Resp: 18 12 17 10   Temp:  TempSrc:      SpO2: 100% 97% 98% 100%   Eyes: Anicteric no pallor. ENMT: No discharge from the ears eyes nose or mouth. Neck: No JVD appreciated no mass felt. Respiratory: No rhonchi or crepitations. Cardiovascular: S1-S2 heard no murmurs appreciated. Abdomen: Soft nontender bowel sounds present. Musculoskeletal: Bilateral lower extremity edema.  Left lower extremities dressing. Skin: Left lower extremity dressing. Neurologic: Alert awake oriented to time place and person.  Moves all extremities. Psychiatric: Appears normal.  Normal affect.   Labs on Admission: I have personally reviewed following labs and imaging studies  CBC: Recent Labs  Lab 03/28/17 1853 03/28/17 1952  WBC 8.7  --   NEUTROABS 6.7  --   HGB 9.5* 9.2*  HCT 29.7* 27.0*  MCV 90.0  --   PLT 159  --    Basic Metabolic Panel: Recent Labs  Lab 03/28/17 1853 03/28/17 1952  NA 135 140  K 4.2 3.9  CL 106 108  CO2 19*  --   GLUCOSE 107* 93  BUN 54* 43*  CREATININE 2.78* 2.60*  CALCIUM 7.8*  --   MG 1.5*  --    GFR: CrCl cannot be calculated  (Unknown ideal weight.). Liver Function Tests: Recent Labs  Lab 03/28/17 1853  AST 39  ALT 27  ALKPHOS 116  BILITOT 0.8  PROT 5.3*  ALBUMIN 2.4*   No results for input(s): LIPASE, AMYLASE in the last 168 hours. No results for input(s): AMMONIA in the last 168 hours. Coagulation Profile: No results for input(s): INR, PROTIME in the last 168 hours. Cardiac Enzymes: No results for input(s): CKTOTAL, CKMB, CKMBINDEX, TROPONINI in the last 168 hours. BNP (last 3 results) No results for input(s): PROBNP in the last 8760 hours. HbA1C: No results for input(s): HGBA1C in the last 72 hours. CBG: Recent Labs  Lab 03/28/17 1850  GLUCAP 109*   Lipid Profile: No results for input(s): CHOL, HDL, LDLCALC, TRIG, CHOLHDL, LDLDIRECT in the last 72 hours. Thyroid Function Tests: No results for input(s): TSH, T4TOTAL, FREET4, T3FREE, THYROIDAB in the last 72 hours. Anemia Panel: No results for input(s): VITAMINB12, FOLATE, FERRITIN, TIBC, IRON, RETICCTPCT in the last 72 hours. Urine analysis:    Component Value Date/Time   COLORURINE AMBER (A) 03/28/2017 2058   APPEARANCEUR CLOUDY (A) 03/28/2017 2058   LABSPEC 1.019 03/28/2017 2058   PHURINE 5.0 03/28/2017 2058   GLUCOSEU NEGATIVE 03/28/2017 2058   HGBUR LARGE (A) 03/28/2017 2058   BILIRUBINUR NEGATIVE 03/28/2017 2058   [place] NEGATIVE 03/28/2017 2058   PROTEINUR 100 (A) 03/28/2017 2058   NITRITE NEGATIVE 03/28/2017 2058   LEUKOCYTESUR MODERATE (A) 03/28/2017 2058   Sepsis Labs: @LABRCNTIP (procalcitonin:4,lacticidven:4) )No results found for this or any previous visit (from the past 240 hour(s)).   Radiological Exams on Admission: No results found.  EKG: Independently reviewed.  Sinus bradycardia.  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 Symptomatic bradycardia with near syncopal episode -patient was initially started on epinephrine infusion before coming to the ER and was discontinued.  In the ER patient was given 1 dose of atropine.  Presently the heart rate is around 60s.  On arrival patient's blood pressure also was in the low normal.  Patient was found to be acidotic and critical care  was consulted they at this time recommended patient to be placed on bicarb drip.  Will hold patient's rate limiting medications including Cardizem and Coreg.  Since patient was recently on steroids will check cortisol levels and also in addition check TSH and cardiac markers.  We will formally consult cardiology. 2. A. Fib -presently bradycardic.  Improved after atropine.  Presently in sinus rhythm.  Patient is on Xarelto and I have confirmed with pharmacy about dosing of Xarelto with renal disease.  Rate limiting medications on hold secondary to #1. 3. Diabetes mellitus type 2 -since not sure if patient had hypoglycemic episode we will hold Lantus and closely follow CBGs with sensitive sliding scale coverage.  Follow cortisol level. 4. History of rheumatoid arthritis/gout -patient still has joint pains and unable to make a complete fist in the hands. 5. Chronic anemia likely from chronic kidney disease -follow CBC. 6. Chronic diastolic CHF last EF measured in April 2018 was 50-55% with grade 3 diastolic dysfunction.  Patient has large lower extremity edema.  Since patient was acidotic bicarbonate drip was started by ER physician after discussing with nephrologist.  Once acidosis corrected soon discontinue fluids and probably will need restarting on his Lasix. 7. Chronic kidney disease stage IV -closely follow metabolic panel intake output. 8. Chronic left lower extremity wound was recently treated with antibiotics.  Will get wound team consult.   DVT prophylaxis:  Xarelto. Code Status: Full code. Family Communication: Patient's wife. Disposition Plan: Home. Consults called: Cardiology. Admission status: Inpatient.   Rise Patience MD [facility] Pager [phone].  If 7PM-7AM, please contact night-coverage [email or web address] Password [facility]  03/28/2017, 9:58 PM

## 2017-03-29 LAB — BASIC METABOLIC PANEL
ANION GAP: 12 (ref 5–15)
ANION GAP: 10 (ref 5–15)
Anion gap: 12 (ref 5–15)
BUN: 57 mg/dL — ABNORMAL HIGH (ref 6–20)
BUN: 55 mg/dL — ABNORMAL HIGH (ref 6–20)
BUN: 53 mg/dL — ABNORMAL HIGH (ref 6–20)
CALCIUM: 8.5 mg/dL — AB (ref 8.9–10.3)
CALCIUM: 8.4 mg/dL — AB (ref 8.9–10.3)
CHLORIDE: 104 mmol/L (ref 101–111)
CHLORIDE: 105 mmol/L (ref 101–111)
CO2: 20 mmol/L — AB (ref 22–32)
CO2: 22 mmol/L (ref 22–32)
CO2: 22 mmol/L (ref 22–32)
CREATININE: 2.66 mg/dL — AB (ref 0.61–1.24)
Calcium: 8.5 mg/dL — ABNORMAL LOW (ref 8.9–10.3)
Chloride: 103 mmol/L (ref 101–111)
Creatinine, Ser: 2.77 mg/dL — ABNORMAL HIGH (ref 0.61–1.24)
Creatinine, Ser: 2.71 mg/dL — ABNORMAL HIGH (ref 0.61–1.24)
GFR calc Af Amer: 30 mL/min — ABNORMAL LOW (ref >60)
GFR calc Af Amer: 31 mL/min — ABNORMAL LOW (ref >60)
GFR calc non Af Amer: 26 mL/min — ABNORMAL LOW (ref >60)
GFR calc non Af Amer: 27 mL/min — ABNORMAL LOW (ref >60)
GFR calc non Af Amer: 28 mL/min — ABNORMAL LOW (ref >60)
GFR, EST AFRICAN AMERICAN: 32 mL/min — AB (ref >60)
GLUCOSE: 109 mg/dL — AB (ref 65–99)
GLUCOSE: 120 mg/dL — AB (ref 65–99)
Glucose, Bld: 149 mg/dL — ABNORMAL HIGH (ref 65–99)
Potassium: 4.8 mmol/L (ref 3.5–5.1)
Potassium: 4.3 mmol/L (ref 3.5–5.1)
Potassium: 4.3 mmol/L (ref 3.5–5.1)
SODIUM: 137 mmol/L (ref 135–145)
Sodium: 136 mmol/L (ref 135–145)
Sodium: 137 mmol/L (ref 135–145)

## 2017-03-29 LAB — HEPATIC FUNCTION PANEL
ALT: 29 U/L (ref 17–63)
AST: 26 U/L (ref 15–41)
Albumin: 2.7 g/dL — ABNORMAL LOW (ref 3.5–5.0)
Alkaline Phosphatase: 129 U/L — ABNORMAL HIGH (ref 38–126)
BILIRUBIN DIRECT: 0.1 mg/dL (ref 0.1–0.5)
BILIRUBIN INDIRECT: 0.8 mg/dL (ref 0.3–0.9)
BILIRUBIN TOTAL: 0.9 mg/dL (ref 0.3–1.2)
Total Protein: 5.6 g/dL — ABNORMAL LOW (ref 6.5–8.1)

## 2017-03-29 LAB — CBC WITH DIFFERENTIAL/PLATELET
Basophils Absolute: 0 10*3/uL (ref 0.0–0.1)
Basophils Relative: 0 %
Eosinophils Absolute: 0.1 10*3/uL (ref 0.0–0.7)
Eosinophils Relative: 1 %
HEMATOCRIT: 30.1 % — AB (ref 39.0–52.0)
Hemoglobin: 10 g/dL — ABNORMAL LOW (ref 13.0–17.0)
Lymphocytes Relative: 22 %
Lymphs Abs: 1.5 10*3/uL (ref 0.7–4.0)
MCH: 29.6 pg (ref 26.0–34.0)
MCHC: 33.2 g/dL (ref 30.0–36.0)
MCV: 89.1 fL (ref 78.0–100.0)
MONO ABS: 0.5 10*3/uL (ref 0.1–1.0)
Monocytes Relative: 7 %
NEUTROS ABS: 4.9 10*3/uL (ref 1.7–7.7)
Neutrophils Relative %: 70 %
Platelets: 163 10*3/uL (ref 150–400)
RBC: 3.38 MIL/uL — ABNORMAL LOW (ref 4.22–5.81)
RDW: 16.9 % — AB (ref 11.5–15.5)
WBC: 7 10*3/uL (ref 4.0–10.5)

## 2017-03-29 LAB — GLUCOSE, CAPILLARY
GLUCOSE-CAPILLARY: 143 mg/dL — AB (ref 65–99)
GLUCOSE-CAPILLARY: 174 mg/dL — AB (ref 65–99)
Glucose-Capillary: 137 mg/dL — ABNORMAL HIGH (ref 65–99)
Glucose-Capillary: 144 mg/dL — ABNORMAL HIGH (ref 65–99)

## 2017-03-29 LAB — CORTISOL: Cortisol, Plasma: 14.2 ug/dL

## 2017-03-29 MED ORDER — FUROSEMIDE 10 MG/ML IJ SOLN
80.0000 mg | Freq: Two times a day (BID) | INTRAMUSCULAR | Status: DC
  Administered 2017-03-29 – 2017-03-31 (×4): 80 mg via INTRAVENOUS
  Filled 2017-03-29 (×4): qty 8

## 2017-03-29 MED ORDER — CARVEDILOL 12.5 MG PO TABS
12.5000 mg | ORAL_TABLET | Freq: Two times a day (BID) | ORAL | Status: DC
  Administered 2017-03-29 – 2017-03-31 (×4): 12.5 mg via ORAL
  Filled 2017-03-29 (×4): qty 1

## 2017-03-29 MED ORDER — FLUTICASONE PROPIONATE 50 MCG/ACT NA SUSP
2.0000 | Freq: Every day | NASAL | Status: DC
  Administered 2017-03-31: 2 via NASAL
  Filled 2017-03-29: qty 16

## 2017-03-29 MED ORDER — RIVAROXABAN 15 MG PO TABS
15.0000 mg | ORAL_TABLET | Freq: Every day | ORAL | Status: DC
  Administered 2017-03-29: 15 mg via ORAL
  Filled 2017-03-29: qty 1

## 2017-03-29 MED ORDER — FUROSEMIDE 10 MG/ML IJ SOLN: 40.0000 mg | Freq: Once | INTRAMUSCULAR | Status: DC

## 2017-03-29 MED ORDER — ALBUTEROL SULFATE (2.5 MG/3ML) 0.083% IN NEBU: 3.0000 mL | INHALATION_SOLUTION | RESPIRATORY_TRACT | Status: DC | PRN

## 2017-03-29 MED ORDER — DILTIAZEM HCL ER COATED BEADS 180 MG PO CP24
180.0000 mg | ORAL_CAPSULE | Freq: Every day | ORAL | Status: DC
  Administered 2017-03-29 – 2017-03-31 (×3): 180 mg via ORAL
  Filled 2017-03-29 (×3): qty 1

## 2017-03-29 MED ORDER — FUROSEMIDE 40 MG PO TABS: 40.0000 mg | ORAL_TABLET | Freq: Two times a day (BID) | ORAL | Status: DC

## 2017-03-29 NOTE — Consult Note (Addendum)
[place] Nurse wound consult note Pt is familiar to [facility] team from recent admission on 12/23. Reason for Consult: Consult requested for left leg wound.  Pt states he is followed by the outpatient wound care center prior to admission. Left leg with full thickness wound has greatly improved since the previous admission; .8X.8X.3cm, red moist wound bed, no odor, sm amt tan drainage. Dressing procedure/placement/frequency: Aquacel to absorb drainage and provide antimicrobial benefits to left leg, silicone foam dressing to protect from further injury. Pt states he was previously wearing an IT trainer but had it removed, since "his foot and leg were going numb." Plan to leave this off during the hospital stay since wound is progressing and pt has his legs elevated at this time.  Pt can resume followup with the outpatient wound care center after discharge. Discussed plan of care and pt verbalized understanding. Please re-consult if further assistance is needed.  Thank-you,  [name] Girt MSN, [place], [place], [place], [place]

## 2017-03-29 NOTE — Progress Notes (Signed)
[place] TEAM 1 - Stepdown/ICU TEAM  John Bailey  GYI:[id number] DOB: 10/16/1970 DOA: 03/28/2017 PCP: [name], MD    Brief Narrative:  47 y.o. male with a history of chronic diastolic CHF, CKD stage IV, RA, pseudogout, DM, persistent atrial fibrillation, and chronic left lower extremity wound who was brought to the ER after a near syncopal episode.  Around 5:00PM he took his NovoLog pre-meal dose and was waiting for the food when suddenly he felt nauseated and started vomiting.  His wife noticed that he almost passed out but did not lose consciousness.  This lasted for less than a minute during which he did not have any incontinence of urine or tongue biting.  Patient remembered the episode.  The same thing happened again after half an hour and EMS was called.  When EMS arrived he was found to be bradycardic and was placed on epinephrine infusion very briefly.   In the ER the patient became bradycardic again and Cardiology was consulted and patient was given 1 dose of atropine following which heart rate improved.    Significant Events: 1/13 admit  Subjective: Patient reports that this episode felt similar to previous episodes of hypoglycemia.  He currently denies chest pain shortness of breath nausea or vomiting.  Clinically he appears significantly volume overloaded.  He does feel that his legs are much more swollen than typical.  Assessment & Plan:  Symptomatic bradycardia with near syncopal episode Following on tele - Cards to arrange for outpt heart monitor  A. Fib is on Xarelto chronically - continue - care w/ rate controlling meds in setting of bradycardia   Acute exacerbation of Chronic diastolic CHF EF April 3500 50-55% with grade 3 diastolic dysfunction - signif worse LE edema on exam than per his reported baseline - diurese w/ IV lasix - follow Is/Os and daily weights   ?R atrial mass Noted on TEE May 2017 - Cards suggesting cardiac MRI   DM2 CBG currently  reasonably controlled   RA / Pseudogout  Chronic anemia of chronic kidney disease  Follow Hgb trend   Chronic kidney disease stage IV Recent baseline crt as high as 3.5 - renal fxn stable presently but needs signif diuresis so will need to follow trend   Chronic left lower extremity wound recently treated with antibiotics  Morbid obesity - Body mass index is 38.27 kg/m.   DVT prophylaxis: Xarelto  Code Status: FULL CODE Family Communication: spoke w/ wife at bedside  Disposition Plan: SDU  Consultants:  Cardiology   Antimicrobials:  none  Objective: Blood pressure (!) 122/101, pulse 92, temperature 98.1 F (36.7 C), temperature source Oral, resp. rate 15, height 6' (1.829 m), weight 128 kg (282 lb 3 oz), SpO2 100 %.  Intake/Output Summary (Last 24 hours) at 03/29/2017 1150 Last data filed at 03/29/2017 1100 Gross per 24 hour  Intake 1240 ml  Output 251 ml  Net 989 ml   Filed Weights   03/29/17 0200  Weight: 128 kg (282 lb 3 oz)    Examination: General: No acute respiratory distress Lungs: poor air movement th/o all fields - silent in bases - no wheezing  Cardiovascular: irreg irreg - no M or rub  Abdomen: Nontender, morbidly obese, soft, bowel sounds positive, no rebound Extremities: 3+ pitting B LE edema   CBC: Recent Labs  Lab 03/28/17 1853 03/28/17 1952 03/29/17 0314  WBC 8.7  --  7.0  NEUTROABS 6.7  --  4.9  HGB 9.5* 9.2* 10.0*  HCT 29.7* 27.0* 30.1*  MCV 90.0  --  89.1  PLT 159  --  361   Basic Metabolic Panel: Recent Labs  Lab 03/28/17 1853 03/28/17 1952 03/28/17 2159 03/28/17 2320 03/29/17 0314 03/29/17 1100  NA 135 140 135 136 137 137  K 4.2 3.9 4.5 4.8 4.3 4.3  CL 106 108 104 104 105 103  CO2 19*  --  20* 20* 22 22  GLUCOSE 107* 93 108* 109* 120* 149*  BUN 54* 43* 57* 57* 55* 53*  CREATININE 2.78* 2.60* 2.78* 2.77* 2.71* 2.66*  CALCIUM 7.8*  --  8.4* 8.5* 8.5* 8.4*  MG 1.5*  --   --   --   --   --    GFR: Estimated Creatinine  Clearance: 49 mL/min (A) (by C-G formula based on SCr of 2.66 mg/dL (H)).  Liver Function Tests: Recent Labs  Lab 03/28/17 1853 03/29/17 0314  AST 39 26  ALT 27 29  ALKPHOS 116 129*  BILITOT 0.8 0.9  PROT 5.3* 5.6*  ALBUMIN 2.4* 2.7*    Cardiac Enzymes: Recent Labs  Lab 03/28/17 2159  TROPONINI 0.06*    HbA1C: Hgb A1c MFr Bld  Date/Time Value Ref Range Status  03/06/2017 08:57 PM 9.2 (H) 4.8 - 5.6 % Final    Comment:    (NOTE) Pre diabetes:          5.7%-6.4% Diabetes:              >6.4% Glycemic control for   <7.0% adults with diabetes   12/02/2016 08:43 PM 14.5 (H) 4.8 - 5.6 % Final    Comment:    (NOTE)         Prediabetes: 5.7 - 6.4         Diabetes: >6.4         Glycemic control for adults with diabetes: <7.0     CBG: Recent Labs  Lab 03/28/17 1850 03/28/17 2351 03/29/17 0833 03/29/17 1114  GLUCAP 109* 115* 143* 137*    Scheduled Meds: . [START ON 03/31/2017] fluticasone  2 spray Each Nare Daily  . gabapentin  300 mg Oral BID  . insulin aspart  0-9 Units Subcutaneous TID WC  . rivaroxaban  15 mg Oral QHS     LOS: 1 day   Cherene Altes, MD [facility] Office  [phone] Pager - Text Page per Amion as per below:  On-Call/Text Page:      [email or web address]      password [email or web address]  If 7PM-7AM, please contact night-coverage [email or web address] Password [email or web address] 03/29/2017, 11:50 AM

## 2017-03-29 NOTE — ED Notes (Signed)
Pt was able to ambulate without assistance. NAD Noted

## 2017-03-29 NOTE — ED Notes (Signed)
Called lab to add on cortisol blood test.

## 2017-03-29 NOTE — Consult Note (Signed)
Cardiology Consultation:   Patient ID: John Bailey; [id number]; 01-26-1971   Admit date: 03/28/2017 Date of Consult: 03/29/2017  Primary Care Provider: [name], MD Primary Cardiologist: Dr. Debara Bailey    Patient Profile:   John Bailey is a 47 y.o. male with PMH of chronic diastolic CHF, atrial fibrillation, CKD stabe IV, DM, RA, psudogout, a chronic LLE wound, and obesity who is being seen today for the evaluation of bradycardia  andsyncope at the request of Dr. Thereasa Bailey.  History of Present Illness:   John Bailey is a 47 y.o. male with PMH of chronic diastolic CHF, atrial fibrillation, CKD stabe IV, DM, RA, psudogout, a chronic LLE wound, and obesity who presented 03/28/17 with  syncope.  Patient states he was in his usual state of health until yesterday evening. 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 Patient reports he did not eat breakfast or lunch and had just administered his evening pre-meal Novolog. He sat down to eat approximately 15 minutes later when he felt sudden onset nausea and started vomiting. Wife noted patient slumped back in his chair and eyes rolled back and was not responsive for a few seconds. The whole episode lasted for less than 1 minute and patient was without tonic-clinic activity, incontinence, or tongue biting.  Another episode occurred approximately 30 minutes later prompting EMS to be called. Patient was able to recall events surrounding these episodes. He notes a history of seizures as a child and states this did not feel like a seizure. He attributes his symptoms to hypoglycemia, which he has experienced in the past when started on insulin. On EMS arrival, patient was found to be bradycardic and was placed on an epinephrine infusion en route to the ED. In the ED, Epi was discontinued and patient was given 1x dose of atropine with improvement in HR.  Patient was last seen in the Cardiology clinic 12/29/16 for a post hospital admission  where he was aggressively diuresed for acute on chronic diastolic CHF. Patient was reported to be doing well at that time and had continued to lose weight. He had undergone a recent DCCV, however was found to be back in Afib with HR in the 80s at that time.  Pt ahd echo back in Spring 2017 with severe LV systolic dysfunction  (poss tachy mediated)  At the time of this interview, patient feels back to his usual self. He denies any CP, SOB, diaphoresis, lightheadedness/dizziness surrounding these episodes. States he has been doing well from a cardiac standpoint, however has had increased edema since being started on prednisone. He states he has been compliant with his medications, including xarelto and lasix 40mg  BID. He denies orthopnea, PND, SOB at rest, DOE, CP with exertion.     Past Medical History:  Diagnosis Date  . Atrial flutter with rapid ventricular response ([place]) 08/06/2015   a. s/p DCCV in 07/2015 with recurrent atrial fibrillation and DCCV in 12/2016. Initially successful but noted to be back in atrial fibrillation within 2 weeks of DCCV.   [name] Chronic diastolic (congestive) heart failure ([facility])    a. EF 50-55% by echo in 06/2016.  . Diabetes ([place])   . Hypertension     Past Surgical History:  Procedure Laterality Date  . CARDIOVERSION N/A 08/01/2015   Procedure: CARDIOVERSION;  Surgeon: John Hector, MD;  Location: [facility] ENDOSCOPY;  Service: Cardiovascular;  Laterality: N/A;  . CARDIOVERSION N/A 12/18/2016   Procedure: CARDIOVERSION;  Surgeon: John Latch, MD;  Location: [facility] ENDOSCOPY;  Service: Cardiovascular;  Laterality: N/A;  . CHOLECYSTECTOMY    . TEE WITHOUT CARDIOVERSION N/A 08/01/2015   Procedure: TRANSESOPHAGEAL ECHOCARDIOGRAM (TEE);  Surgeon: John Hector, MD;  Location: [facility] ENDOSCOPY;  Service: Cardiovascular;  Laterality: N/A;     Home Medications:  Prior to Admission medications   Medication Sig Start Date End Date Taking? Authorizing Provider    acetaminophen-codeine (TYLENOL #3) 300-30 MG tablet Take 1 tablet by mouth every 8 (eight) hours as needed for moderate pain or severe pain.  11/26/16  Yes [provider]  albuterol (PROVENTIL HFA;VENTOLIN HFA) 108 (90 BASE) MCG/ACT inhaler Inhale 1-2 puffs into the lungs every 6 (six) hours as needed for wheezing. 05/30/14  Yes Bailey, John Cheng, MD  benzonatate (TESSALON) 100 MG capsule Take 1 capsule (100 mg total) by mouth 3 (three) times daily as needed for cough. 12/16/16  Yes John Dubois, MD  carvedilol (COREG) 25 MG tablet Take 25 mg by mouth 2 (two) times daily. 03/24/17  Yes [provider]  celecoxib (CELEBREX) 100 MG capsule Take 100 mg by mouth daily. 03/04/17  Yes [provider]  colchicine 0.6 MG tablet Take 0.5 tablets (0.3 mg total) by mouth daily. 12/16/16  Yes John Dubois, MD  diltiazem (CARDIZEM CD) 360 MG 24 hr capsule Take 1 capsule (360 mg total) by mouth daily. 12/29/16  Yes Bailey, Power, PA-C  fluticasone (FLONASE) 50 MCG/ACT nasal spray Place 2 sprays into the nose daily.  12/29/12  Yes [provider]  furosemide (LASIX) 40 MG tablet Take 40 mg by mouth 2 (two) times daily. 03/24/17  Yes [provider]  gabapentin (NEURONTIN) 300 MG capsule Take 1 capsule (300 mg total) by mouth 2 (two) times daily. 03/09/17  Yes John Odea, MD  insulin aspart (NOVOLOG FLEXPEN) 100 UNIT/ML FlexPen Inject 0-12 Units into the skin 3 (three) times daily as needed for high blood sugar. Sliding Scale <70, initiate hypoglycemia protocol, 70-100=0 units, 110-199= 3 units, 200-250=5 units, 251-300=7 units, 301-350=10 units, 351-400=12 units, >400=14 units. Call MD if >450 03/09/17  Yes John Odea, MD  Insulin Glargine (LANTUS) 100 UNIT/ML Solostar Pen Inject 25 Units into the skin daily at 10 pm. 03/09/17  Yes Bailey, John Blase, MD  potassium chloride SA (KLOR-CON M20) 20 MEQ tablet Take 1 tablet (20 mEq total) by mouth daily. 08/19/15  Yes Bailey,  John Corwin, MD  rivaroxaban (XARELTO) 20 MG TABS tablet take 1 tablet by mouth once daily WITH SUPPER Patient taking differently: Take 20 mg by mouth at bedtime.  10/12/16  Yes Bailey, John Corwin, MD  amoxicillin-clavulanate (AUGMENTIN) 875-125 MG tablet Take 1 tablet by mouth 2 (two) times daily. Patient not taking: Reported on 03/28/2017 03/09/17   John Odea, MD  carvedilol (COREG) 12.5 MG tablet Take 3 tablets (37.5 mg total) by mouth 2 (two) times daily with a meal. Patient not taking: Reported on 03/28/2017 12/16/16   John Dubois, MD  doxycycline (VIBRA-TABS) 100 MG tablet Take 1 tablet (100 mg total) by mouth every 12 (twelve) hours. Patient not taking: Reported on 03/28/2017 03/09/17   John Odea, MD  insulin aspart (NOVOLOG PENFILL) cartridge Inject 5 Units into the skin 3 (three) times daily with meals. Patient not taking: Reported on 03/06/2017 08/08/15   Charlynne Cousins, MD  Insulin Pen Needle 31G X 6 MM MISC 1 Device by Does not apply route 2 (two) times daily. Patient not taking: Reported on 03/28/2017 08/08/15   Charlynne Cousins, MD  predniSONE (DELTASONE) 10 MG tablet 60 mg x 3 days 40 mg x 3 days 20 mg x 3 days 10 mg x 3 day 5 mg x 3 days Patient not taking: Reported on 03/28/2017 03/09/17   John Odea, MD    Inpatient Medications: Scheduled Meds: . [START ON 03/31/2017] fluticasone  2 spray Each Nare Daily  . gabapentin  300 mg Oral BID  . insulin aspart  0-9 Units Subcutaneous TID WC  . rivaroxaban  15 mg Oral QHS   Continuous Infusions: .  sodium bicarbonate (isotonic) infusion in sterile water 100 mL/hr at 03/28/17 2219   PRN Meds: acetaminophen **OR** acetaminophen, albuterol  Allergies:   No Known Allergies  Social History:   Social History   Socioeconomic History  . Marital status: Married    Spouse name: Not on file  . Number of children: Not on file  . Years of education: Not on file  . Highest education level: Not on file  Social Needs    . Financial resource strain: Not on file  . Food insecurity - worry: Not on file  . Food insecurity - inability: Not on file  . Transportation needs - medical: Not on file  . Transportation needs - non-medical: Not on file  Occupational History  . Not on file  Tobacco Use  . Smoking status: Never Smoker  . Smokeless tobacco: Never Used  Substance and Sexual Activity  . Alcohol use: No  . Drug use: No  . Sexual activity: Yes  Other Topics Concern  . Not on file  Social History Narrative  . Not on file    Family History:    Family History  Problem Relation Age of Onset  . Heart disease Mother   . Hyperlipidemia Father   . Hypertension Father      ROS:  Please see the history of present illness.   All other ROS reviewed and negative.     Physical Exam/Data:   Vitals:   03/29/17 0608 03/29/17 0613 03/29/17 0646 03/29/17 0800  BP: 116/86  (!) 122/101   Pulse: 89 87 92   Resp: 20 11 15    Temp:   97.7 F (36.5 C) 98.1 F (36.7 C)  TempSrc:   Oral Oral  SpO2: 97% 100% 100%   Weight:      Height:   6' (1.829 m)     Intake/Output Summary (Last 24 hours) at 03/29/2017 1055 Last data filed at 03/29/2017 0352 Gross per 24 hour  Intake 1000 ml  Output 1 ml  Net 999 ml   Filed Weights   03/29/17 0200  Weight: 282 lb 3 oz (128 kg)   Body mass index is 38.27 kg/m.  General:  Well nourished, well developed, laying in bed in no acute distress HEENT: sclera anicteric  Lymph: no adenopathy Neck: difficult to appreciate JVD given body habitus  JVP appears elevated   Vascular: No carotid bruits; distal pulses 2+ bilaterally Cardiac:  Irregular rate/rhythm; no murmur, gallops, or rubs Lungs:  clear to auscultation bilaterally, no wheezing, rhonchi or rales  Abd: NABS, obese, soft, nontender, no hepatomegaly Ext: 2+ LE edema. 1+ to thigh  LLE dressing C/D/I Musculoskeletal:  No deformities, BUE and BLE strength normal and equal Skin: warm and dry  Neuro:  CNs 2-12  intact, no focal abnormalities noted Psych:  Normal affect   EKG:  The EKG was personally reviewed and demonstrates:  Atrial fibrillation, rate 61, relatively unchanged from previous Telemetry:  Telemetry was personally  reviewed and demonstrates:  Atrial fibrillation with rate 80s-100s  Relevant CV Studies: None  Laboratory Data:  Chemistry Recent Labs  Lab 03/28/17 2159 03/28/17 2320 03/29/17 0314  NA 135 136 137  K 4.5 4.8 4.3  CL 104 104 105  CO2 20* 20* 22  GLUCOSE 108* 109* 120*  BUN 57* 57* 55*  CREATININE 2.78* 2.77* 2.71*  CALCIUM 8.4* 8.5* 8.5*  GFRNONAA 26* 26* 27*  GFRAA 30* 30* 31*  ANIONGAP 11 12 10     Recent Labs  Lab 03/28/17 1853 03/29/17 0314  PROT 5.3* 5.6*  ALBUMIN 2.4* 2.7*  AST 39 26  ALT 27 29  ALKPHOS 116 129*  BILITOT 0.8 0.9   Hematology Recent Labs  Lab 03/28/17 1853 03/28/17 1952 03/29/17 0314  WBC 8.7  --  7.0  RBC 3.30*  --  3.38*  HGB 9.5* 9.2* 10.0*  HCT 29.7* 27.0* 30.1*  MCV 90.0  --  89.1  MCH 28.8  --  29.6  MCHC 32.0  --  33.2  RDW 16.9*  --  16.9*  PLT 159  --  163   Cardiac Enzymes Recent Labs  Lab 03/28/17 2159  TROPONINI 0.06*    Recent Labs  Lab 03/28/17 1952  TROPIPOC 0.07    BNPNo results for input(s): BNP, PROBNP in the last 168 hours.  DDimer No results for input(s): DDIMER in the last 168 hours.  Radiology/Studies:  No results found.  Assessment and Plan:  Syncope:  Pt with episode of syncope after day of fasting.  He had just taken insulin  At baseline, pt was on b blocker and bradycardic at time of spell Medicine may  have exacerbated event.     Follow on tele  Check orthostatics No driving for 6 months  Bradycardia: patient developed acute onset N/V and syncope after administering evening pre-meal Novolog. On meds at baselilne that coulf have contributed to  lower HR.   No signif bradycardia now that he is off meds   Follow on tele  Add back slowly as needed  - - Elevated troponin  Troponin  levels  0.07>0.06 Most likely reflect demand ischemiia in setting of LVH and hypotension   Acute on chronic diastolic CHF  .   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 Sleep Apnea  Pt admits to snoring.  Wife says he has apnea- Patient would benefit from sleep study to assess for OSA  Atrial fib  Failed 2 cardioversion  Prob exacerbated by OSA  Rate OK  Follow off meds and titrate.  Pt would prob beneft from being in SR but this prob wont occur if hs sleep apnea - Monitor electrolytes closely and replete as needed. \ CKD Stage IV: creatinine improved from baseline - Continue to monitor Cr closely and dose adjust medications as needed  DM:  - Continue insulin per primary team.  - Monitor closely for hypoglycemia - Counseled on importance of not skipping meals  RA: recently hospitalized 02/2017 for RA flare and started on prednisone taper  Patient said he stopped   - Continue management per primary team  Chronic LLE wound: - Management per primary team Diruese   For questions or updates, please contact [place] Please consult [email or web address] for contact info under Cardiology/STEMI.  Signed, Abigail Butts, PA-C  03/29/2017 10:55 AM [phone]  Pt seen and examined  I have amended note above to reflect my findings   On exam, pt is morbidly obese  Lungs with occasional rare Cardiac exam:  Irreg rate/rhythm  Abd distended  Ext with 2+ edema up to thigh.  Plan continue tele.  Add back meds as HR needs   Diruese. Will continue to follow response  Dorris Carnes

## 2017-03-30 ENCOUNTER — Ambulatory Visit: Payer: BLUE CROSS/BLUE SHIELD | Admitting: Internal Medicine

## 2017-03-30 ENCOUNTER — Other Ambulatory Visit: Payer: Self-pay

## 2017-03-30 LAB — GLUCOSE, CAPILLARY
GLUCOSE-CAPILLARY: 151 mg/dL — AB (ref 65–99)
GLUCOSE-CAPILLARY: 193 mg/dL — AB (ref 65–99)
Glucose-Capillary: 139 mg/dL — ABNORMAL HIGH (ref 65–99)
Glucose-Capillary: 180 mg/dL — ABNORMAL HIGH (ref 65–99)

## 2017-03-30 LAB — CBC
HEMATOCRIT: 32.4 % — AB (ref 39.0–52.0)
Hemoglobin: 10.3 g/dL — ABNORMAL LOW (ref 13.0–17.0)
MCH: 28.5 pg (ref 26.0–34.0)
MCHC: 31.8 g/dL (ref 30.0–36.0)
MCV: 89.8 fL (ref 78.0–100.0)
Platelets: 176 10*3/uL (ref 150–400)
RBC: 3.61 MIL/uL — ABNORMAL LOW (ref 4.22–5.81)
RDW: 16.7 % — AB (ref 11.5–15.5)
WBC: 7.4 10*3/uL (ref 4.0–10.5)

## 2017-03-30 LAB — COMPREHENSIVE METABOLIC PANEL
ALBUMIN: 2.5 g/dL — AB (ref 3.5–5.0)
ALT: 25 U/L (ref 17–63)
AST: 14 U/L — ABNORMAL LOW (ref 15–41)
Alkaline Phosphatase: 120 U/L (ref 38–126)
Anion gap: 10 (ref 5–15)
BILIRUBIN TOTAL: 0.7 mg/dL (ref 0.3–1.2)
BUN: 51 mg/dL — AB (ref 6–20)
CO2: 23 mmol/L (ref 22–32)
Calcium: 8.2 mg/dL — ABNORMAL LOW (ref 8.9–10.3)
Chloride: 103 mmol/L (ref 101–111)
Creatinine, Ser: 2.62 mg/dL — ABNORMAL HIGH (ref 0.61–1.24)
GFR calc Af Amer: 32 mL/min — ABNORMAL LOW (ref >60)
GFR calc non Af Amer: 28 mL/min — ABNORMAL LOW (ref >60)
GLUCOSE: 156 mg/dL — AB (ref 65–99)
POTASSIUM: 4.1 mmol/L (ref 3.5–5.1)
Sodium: 136 mmol/L (ref 135–145)
TOTAL PROTEIN: 5.7 g/dL — AB (ref 6.5–8.1)

## 2017-03-30 MED ORDER — CELECOXIB 100 MG PO CAPS
100.0000 mg | ORAL_CAPSULE | Freq: Every day | ORAL | Status: DC
  Administered 2017-03-31: 100 mg via ORAL
  Filled 2017-03-30: qty 1

## 2017-03-30 MED ORDER — CELECOXIB 100 MG PO CAPS: 100.0000 mg | ORAL_CAPSULE | Freq: Every day | ORAL | Status: DC

## 2017-03-30 MED ORDER — RIVAROXABAN 20 MG PO TABS
20.0000 mg | ORAL_TABLET | Freq: Every day | ORAL | Status: DC
  Administered 2017-03-30: 20 mg via ORAL
  Filled 2017-03-30: qty 1

## 2017-03-30 MED ORDER — PNEUMOCOCCAL VAC POLYVALENT 25 MCG/0.5ML IJ INJ: 0.5000 mL | INJECTION | INTRAMUSCULAR | Status: DC

## 2017-03-30 MED ORDER — CELECOXIB 100 MG PO CAPS
100.0000 mg | ORAL_CAPSULE | Freq: Every day | ORAL | Status: DC
  Filled 2017-03-30: qty 1

## 2017-03-30 NOTE — Progress Notes (Signed)
Progress Note  Patient Name: John Bailey Date of Encounter: 03/30/2017  Primary Cardiologist: Pixie Casino, MD   Subjective   Patient reports feeling good this morning. No complaints overnight. Feels like he urinated a lot yesterday after receiving IV lasix. Reports compliance with compression stockings and LE elevation. Denies CP, SOB, constipation.  Inpatient Medications    Scheduled Meds: . carvedilol  12.5 mg Oral BID WC  . diltiazem  180 mg Oral Daily  . [START ON 03/31/2017] fluticasone  2 spray Each Nare Daily  . furosemide  80 mg Intravenous BID  . gabapentin  300 mg Oral BID  . insulin aspart  0-9 Units Subcutaneous TID WC  . rivaroxaban  15 mg Oral QHS   Continuous Infusions:  PRN Meds: acetaminophen **OR** acetaminophen, albuterol   Vital Signs    Vitals:   03/29/17 1600 03/29/17 1954 03/30/17 0008 03/30/17 0508  BP: 112/84 (!) 131/93 (!) 143/96 124/85  Pulse: (!) 101 (!) 103 (!) 117   Resp: 17 18    Temp: 97.7 F (36.5 C) 98.1 F (36.7 C) 98.5 F (36.9 C) 97.7 F (36.5 C)  TempSrc: Oral Oral Oral Oral  SpO2: 99% 98% 98% 99%  Weight:      Height:        Intake/Output Summary (Last 24 hours) at 03/30/2017 2778 Last data filed at 03/30/2017 0045 Gross per 24 hour  Intake 360 ml  Output 476 ml  Net -116 ml   Filed Weights   03/29/17 0200  Weight: 282 lb 3 oz (128 kg)    Telemetry    Atrial fibrillation - rate 90s-100s - Personally Reviewed   Physical Exam   GEN: obese male laying in bed in no acute distress.   Neck: Difficult to appreciate JVD given body habitus, no carotid bruits Cardiac: irregular rate/rhythm, no murmurs, rubs, or gallops.  Respiratory: Clear to auscultation bilaterally, no wheezes/ rales/ rhonchi GI: NABS, obese, soft, nontender, moderately distended  MS: 2+ LE edema; No deformity. Neuro:  Nonfocal, moving all extremities spontaneously Psych: Normal affect   Labs    Chemistry Recent Labs  Lab  03/28/17 1853  03/29/17 0314 03/29/17 1100 03/30/17 0217  NA 135   < > 137 137 136  K 4.2   < > 4.3 4.3 4.1  CL 106   < > 105 103 103  CO2 19*   < > 22 22 23   GLUCOSE 107*   < > 120* 149* 156*  BUN 54*   < > 55* 53* 51*  CREATININE 2.78*   < > 2.71* 2.66* 2.62*  CALCIUM 7.8*   < > 8.5* 8.4* 8.2*  PROT 5.3*  --  5.6*  --  5.7*  ALBUMIN 2.4*  --  2.7*  --  2.5*  AST 39  --  26  --  14*  ALT 27  --  29  --  25  ALKPHOS 116  --  129*  --  120  BILITOT 0.8  --  0.9  --  0.7  GFRNONAA 26*   < > 27* 28* 28*  GFRAA 30*   < > 31* 32* 32*  ANIONGAP 10   < > 10 12 10    < > = values in this interval not displayed.     Hematology Recent Labs  Lab 03/28/17 1853 03/28/17 1952 03/29/17 0314 03/30/17 0217  WBC 8.7  --  7.0 7.4  RBC 3.30*  --  3.38* 3.61*  HGB 9.5*  9.2* 10.0* 10.3*  HCT 29.7* 27.0* 30.1* 32.4*  MCV 90.0  --  89.1 89.8  MCH 28.8  --  29.6 28.5  MCHC 32.0  --  33.2 31.8  RDW 16.9*  --  16.9* 16.7*  PLT 159  --  163 176    Cardiac Enzymes Recent Labs  Lab 03/28/17 2159  TROPONINI 0.06*    Recent Labs  Lab 03/28/17 1952  TROPIPOC 0.07     BNPNo results for input(s): BNP, PROBNP in the last 168 hours.   DDimer No results for input(s): DDIMER in the last 168 hours.   Radiology    No results found.  Cardiac Studies   None  Patient Profile     47 y.o. male with PMH of chronic diastolic CHF, atrial fibrillation, CKD stabe IV, DM, RA, psudogout, a chronic LLE wound, and obesity who is being followed by cardiology for the evaluation of bradycardia and syncope.  Assessment & Plan    1. Syncope: patient reports fasting all day. Episode occurred after taking pm pre-meal insulin and having N/V. Events possibly exacerbated by beta blocker use. Likely vasovagal.  - Will check orthostatic vitals - Monitor on telemetry - Recommend no driving x6 months  2. Bradycardia: unclear cause, however patient on medications that could have contributed to lower HR.  -  s/p 1x dose atropine in ED, now with HR in low 100s - Will add back medications slowly for better rate control of atrial fibrillation  3. Atrial fibrillation: s/p 2 failed DCCV. - Rate now in 100s-110s - Will likely need to uptitrate his medications today (coreg/dilt)  4. Acute on chronic diastolic CHF: volume overloaded on exam - Continue IV lasix 80mg  BID -  If I&Os are accurate, patient +787mL this admission. Goal -1L q24hr - Monitor strict I&Os, daily weights. Monitor electrolytes and replete as needed - Would recommend a cardiac MRI to define LVH and myocardial changes, as well as to further characterize a R atrial mass seen on TEE 2017.   5. ?OSA: patient admits to snoring and wife notes episodes of apnea. Could also be contributing to his persistent Afib  - Patient would benefit from a sleep study after discharge  6. DM: - ISS per primary team  7. RA: recent hospitalization 02/2017 for RA flare and started on prednisone taper (now off) - Management per primary team  8. Chronic LLE wound: - Management per primary team   For questions or updates, please contact [place] Please consult [email or web address] for contact info under Cardiology/STEMI.      Signed, Abigail Butts, PA-C  03/30/2017, 6:35 AM   [phone]  Pt seen and examined  I agree with findings as noted by K Kroeger above  Pt feels some better  Lungs are CTA   Cardiac exam:  irreg irreg  No S3  Ext with 1+ edema  Improved from yesterday  Minimal thigh edema  Syncope ;  Probably due to a combination of things (vagal, glu, meds)  Follow  Acute on chronic diastolic CHF with signif LVH Diurese. I would give 1 more day (today) of IV lasix    Outpt MRI  ? Sleep apnea  Needs study  Atrial fib  Rate control and anticoagulation.  Dorris Carnes

## 2017-03-30 NOTE — Progress Notes (Signed)
[facility] TEAM 1 - Stepdown/ICU TEAM  John Bailey  WGN:[id number] DOB: 06-24-1970 DOA: 03/28/2017 PCP: [name], MD    Brief Narrative:  47 y.o. male with a history of chronic diastolic CHF, CKD stage IV, RA, pseudogout, DM, persistent atrial fibrillation, and chronic left lower extremity wound who was brought to the ER after a near syncopal episode.  Around 5:00PM he took his NovoLog pre-meal dose and was waiting for the food when suddenly he felt nauseated and started vomiting.  His wife noticed that he almost passed out but did not lose consciousness.  This lasted for less than a minute during which he did not have any incontinence of urine or tongue biting.  Patient remembered the episode.  The same thing happened again after half an hour and EMS was called.  When EMS arrived he was found to be bradycardic and was placed on epinephrine infusion very briefly.   In the ER the patient became bradycardic again and Cardiology was consulted and patient was given 1 dose of atropine following which heart rate improved.    Significant Events: 1/13 admit  Subjective: The patient is resting comfortably in bed.  He states he has been up and around the room today without difficulty.  He denies lightheadedness shortness of breath nausea vomiting abdominal pain or chest pain.  He feels that he is back to his normal.  He is presently on half dose of his Coreg and Cardizem.  His heart rate appears to be well controlled at this time.  We have not experienced any further episodes of hypoglycemia or bradycardia.  Assessment & Plan:  Symptomatic bradycardia with near syncopal episode Cards to arrange for outpt heart monitor - possibly a vasovagal episode brought on by sx of hypoglycemia - HR now climbing - cont on half dose of his BB and CCB and monitor tele over night - if HR stable, will plan to d/c home on lower dose of meds - if HR increases will need to resume usual dose of meds at time of d/c    A. Fib on Xarelto chronically > continue - care w/ rate controlling meds in setting of bradycardia - plan as noted above   Acute exacerbation of Chronic diastolic CHF EF April 7846 50-55% with grade 3 diastolic dysfunction - signif worse LE edema on presentation than per his reported baseline - diuresing w/ IV lasix - follow Is/Os and daily weights - suspect weight today not accurate - exam suggests improvement in volume overload   Filed Weights   03/29/17 0200 03/30/17 0720  Weight: 128 kg (282 lb 3 oz) (!) 152.6 kg (336 lb 6.4 oz)    ?R atrial mass Noted on TEE May 2017 - Cards suggesting cardiac MRI (will leave to them to order/arrange)  DM2 CBG currently reasonably controlled - no hypoglycemia this admit thus far   RA / Pseudogout  Chronic anemia of chronic kidney disease  Hgb stable   Chronic kidney disease stage IV Recent baseline crt as high as 3.5 - renal fxn stable presently - f/u again in AM given ongoing diuresis   Chronic left lower extremity wound recently treated with antibiotics - cont care as directed by [facility]   Morbid obesity - Body mass index is 45.62 kg/m.   DVT prophylaxis: Xarelto  Code Status: FULL CODE Family Communication: spoke w/ wife at bedside  Disposition Plan: tele bed - monitor on tele over night - cont to diurese - if HR and CBG  stable over night, will plan for probable d/c home 1/16  Consultants:  Cardiology   Antimicrobials:  none  Objective: Blood pressure 130/78, pulse (!) 117, temperature 98.1 F (36.7 C), temperature source Oral, resp. rate 18, height 6' (1.829 m), weight (!) 152.6 kg (336 lb 6.4 oz), SpO2 99 %.  Intake/Output Summary (Last 24 hours) at 03/30/2017 1350 Last data filed at 03/30/2017 6045 Gross per 24 hour  Intake 480 ml  Output 351 ml  Net 129 ml   Filed Weights   03/29/17 0200 03/30/17 0720  Weight: 128 kg (282 lb 3 oz) (!) 152.6 kg (336 lb 6.4 oz)    Examination: General: No acute respiratory distress  - alert and conversant  Lungs: poor air movement th/o all fields due to body habitus - silent in bases - no wheezing appreciated  Cardiovascular: irreg irreg - no M or rub - HS distant  Abdomen: Nontender, morbidly obese, soft, BS+, benign  Extremities: 2+ pitting B LE edema   CBC: Recent Labs  Lab 03/28/17 1853 03/28/17 1952 03/29/17 0314 03/30/17 0217  WBC 8.7  --  7.0 7.4  NEUTROABS 6.7  --  4.9  --   HGB 9.5* 9.2* 10.0* 10.3*  HCT 29.7* 27.0* 30.1* 32.4*  MCV 90.0  --  89.1 89.8  PLT 159  --  163 409   Basic Metabolic Panel: Recent Labs  Lab 03/28/17 1853  03/28/17 2159 03/28/17 2320 03/29/17 0314 03/29/17 1100 03/30/17 0217  NA 135   < > 135 136 137 137 136  K 4.2   < > 4.5 4.8 4.3 4.3 4.1  CL 106   < > 104 104 105 103 103  CO2 19*  --  20* 20* 22 22 23   GLUCOSE 107*   < > 108* 109* 120* 149* 156*  BUN 54*   < > 57* 57* 55* 53* 51*  CREATININE 2.78*   < > 2.78* 2.77* 2.71* 2.66* 2.62*  CALCIUM 7.8*  --  8.4* 8.5* 8.5* 8.4* 8.2*  MG 1.5*  --   --   --   --   --   --    < > = values in this interval not displayed.   GFR: Estimated Creatinine Clearance: 54.8 mL/min (A) (by C-G formula based on SCr of 2.62 mg/dL (H)).  Liver Function Tests: Recent Labs  Lab 03/28/17 1853 03/29/17 0314 03/30/17 0217  AST 39 26 14*  ALT 27 29 25   ALKPHOS 116 129* 120  BILITOT 0.8 0.9 0.7  PROT 5.3* 5.6* 5.7*  ALBUMIN 2.4* 2.7* 2.5*    Cardiac Enzymes: Recent Labs  Lab 03/28/17 2159  TROPONINI 0.06*    HbA1C: Hgb A1c MFr Bld  Date/Time Value Ref Range Status  03/06/2017 08:57 PM 9.2 (H) 4.8 - 5.6 % Final    Comment:    (NOTE) Pre diabetes:          5.7%-6.4% Diabetes:              >6.4% Glycemic control for   <7.0% adults with diabetes   12/02/2016 08:43 PM 14.5 (H) 4.8 - 5.6 % Final    Comment:    (NOTE)         Prediabetes: 5.7 - 6.4         Diabetes: >6.4         Glycemic control for adults with diabetes: <7.0     CBG: Recent Labs  Lab  03/29/17 1114 03/29/17 1604 03/29/17 2152 03/30/17 0831  03/30/17 1315  GLUCAP 137* 174* 144* 139* 151*    Scheduled Meds: . carvedilol  12.5 mg Oral BID WC  . celecoxib  100 mg Oral Daily  . diltiazem  180 mg Oral Daily  . [START ON 03/31/2017] fluticasone  2 spray Each Nare Daily  . furosemide  80 mg Intravenous BID  . gabapentin  300 mg Oral BID  . insulin aspart  0-9 Units Subcutaneous TID WC  . [START ON 03/31/2017] pneumococcal 23 valent vaccine  0.5 mL Intramuscular Tomorrow-1000  . rivaroxaban  15 mg Oral QHS     LOS: 2 days   Cherene Altes, MD [facility] Office  [phone] Pager - Text Page per Amion as per below:  On-Call/Text Page:      [email or web address]      password [email or web address]  If 7PM-7AM, please contact night-coverage [email or web address] Password [email or web address] 03/30/2017, 1:50 PM

## 2017-03-30 NOTE — Clinical Social Work Note (Signed)
CSW acknowledges consult, "Needs help with Medication affordability." Will notify RNCM in morning progression meeting.  CSW signing off. Consult again if any social work needs arise.  Dayton Scrape, [place]

## 2017-03-31 ENCOUNTER — Other Ambulatory Visit: Payer: Self-pay

## 2017-03-31 DIAGNOSIS — E1129 Type 2 diabetes mellitus with other diabetic kidney complication: Secondary | ICD-10-CM

## 2017-03-31 DIAGNOSIS — N184 Chronic kidney disease, stage 4 (severe): Secondary | ICD-10-CM

## 2017-03-31 DIAGNOSIS — I480 Paroxysmal atrial fibrillation: Secondary | ICD-10-CM

## 2017-03-31 DIAGNOSIS — R001 Bradycardia, unspecified: Principal | ICD-10-CM

## 2017-03-31 DIAGNOSIS — Z794 Long term (current) use of insulin: Secondary | ICD-10-CM

## 2017-03-31 LAB — BASIC METABOLIC PANEL
Anion gap: 12 (ref 5–15)
BUN: 47 mg/dL — AB (ref 6–20)
CALCIUM: 8.5 mg/dL — AB (ref 8.9–10.3)
CO2: 23 mmol/L (ref 22–32)
CREATININE: 2.55 mg/dL — AB (ref 0.61–1.24)
Chloride: 103 mmol/L (ref 101–111)
GFR calc Af Amer: 34 mL/min — ABNORMAL LOW (ref >60)
GFR calc non Af Amer: 29 mL/min — ABNORMAL LOW (ref >60)
GLUCOSE: 233 mg/dL — AB (ref 65–99)
Potassium: 4.3 mmol/L (ref 3.5–5.1)
Sodium: 138 mmol/L (ref 135–145)

## 2017-03-31 LAB — CBC
HCT: 29.6 % — ABNORMAL LOW (ref 39.0–52.0)
HEMOGLOBIN: 9.5 g/dL — AB (ref 13.0–17.0)
MCH: 28.8 pg (ref 26.0–34.0)
MCHC: 32.1 g/dL (ref 30.0–36.0)
MCV: 89.7 fL (ref 78.0–100.0)
PLATELETS: 167 10*3/uL (ref 150–400)
RBC: 3.3 MIL/uL — ABNORMAL LOW (ref 4.22–5.81)
RDW: 16.8 % — ABNORMAL HIGH (ref 11.5–15.5)
WBC: 6 10*3/uL (ref 4.0–10.5)

## 2017-03-31 LAB — GLUCOSE, CAPILLARY: GLUCOSE-CAPILLARY: 178 mg/dL — AB (ref 65–99)

## 2017-03-31 MED ORDER — CARVEDILOL 12.5 MG PO TABS: 12.5000 mg | ORAL_TABLET | Freq: Two times a day (BID) | ORAL | 0 refills | Status: DC

## 2017-03-31 MED ORDER — DILTIAZEM HCL ER COATED BEADS 180 MG PO CP24: 180.0000 mg | ORAL_CAPSULE | Freq: Every day | ORAL | 0 refills | Status: DC

## 2017-03-31 NOTE — Progress Notes (Signed)
Progress Note  Patient Name: John Bailey Date of Encounter: 03/31/2017  Primary Cardiologist: Pixie Casino, MD   Subjective   Patient reports feeling good this morning. No complaints overnight. Feels like he urinated a lot yesterday after receiving IV lasix. Reports compliance with compression stockings and LE elevation. Denies CP, SOB, constipation.  Inpatient Medications    Scheduled Meds: . carvedilol  12.5 mg Oral BID WC  . celecoxib  100 mg Oral Daily  . diltiazem  180 mg Oral Daily  . fluticasone  2 spray Each Nare Daily  . furosemide  80 mg Intravenous BID  . gabapentin  300 mg Oral BID  . insulin aspart  0-9 Units Subcutaneous TID WC  . pneumococcal 23 valent vaccine  0.5 mL Intramuscular Tomorrow-1000  . rivaroxaban  20 mg Oral Q supper   Continuous Infusions:  PRN Meds: acetaminophen **OR** acetaminophen, albuterol   Vital Signs    Vitals:   03/30/17 1811 03/30/17 2320 03/31/17 0355 03/31/17 0359  BP: (!) 143/107  128/89   Pulse: 96  99   Resp:      Temp:  97.7 F (36.5 C)  97.7 F (36.5 C)  TempSrc:  Oral  Oral  SpO2:   95%   Weight:      Height:        Intake/Output Summary (Last 24 hours) at 03/31/2017 0931 Last data filed at 03/30/2017 1356 Gross per 24 hour  Intake 582 ml  Output -  Net 582 ml   Filed Weights   03/29/17 0200 03/30/17 0720  Weight: 282 lb 3 oz (128 kg) (!) 336 lb 6.4 oz (152.6 kg)    Telemetry    Atrial fibrillation - rate 90s-110s - Personally Reviewed   Physical Exam   GEN: obese male laying in bed in no acute distress.   Neck: Difficult to appreciate JVD given body habitus, no carotid bruits Cardiac: irregular rate/rhythm, no murmurs, rubs, or gallops.  Respiratory: Clear to auscultation bilaterally, no wheezes/ rales/ rhonchi GI: NABS, obese, soft, nontender, moderately distended  MS: 2+ LE edema; No deformity. Neuro:  Nonfocal, moving all extremities spontaneously Psych: Normal affect   Labs      Chemistry Recent Labs  Lab 03/28/17 1853  03/29/17 0314 03/29/17 1100 03/30/17 0217 03/31/17 0219  NA 135   < > 137 137 136 138  K 4.2   < > 4.3 4.3 4.1 4.3  CL 106   < > 105 103 103 103  CO2 19*   < > 22 22 23 23   GLUCOSE 107*   < > 120* 149* 156* 233*  BUN 54*   < > 55* 53* 51* 47*  CREATININE 2.78*   < > 2.71* 2.66* 2.62* 2.55*  CALCIUM 7.8*   < > 8.5* 8.4* 8.2* 8.5*  PROT 5.3*  --  5.6*  --  5.7*  --   ALBUMIN 2.4*  --  2.7*  --  2.5*  --   AST 39  --  26  --  14*  --   ALT 27  --  29  --  25  --   ALKPHOS 116  --  129*  --  120  --   BILITOT 0.8  --  0.9  --  0.7  --   GFRNONAA 26*   < > 27* 28* 28* 29*  GFRAA 30*   < > 31* 32* 32* 34*  ANIONGAP 10   < > 10 12 10 12    < > =  values in this interval not displayed.     Hematology Recent Labs  Lab 03/29/17 0314 03/30/17 0217 03/31/17 0219  WBC 7.0 7.4 6.0  RBC 3.38* 3.61* 3.30*  HGB 10.0* 10.3* 9.5*  HCT 30.1* 32.4* 29.6*  MCV 89.1 89.8 89.7  MCH 29.6 28.5 28.8  MCHC 33.2 31.8 32.1  RDW 16.9* 16.7* 16.8*  PLT 163 176 167    Cardiac Enzymes Recent Labs  Lab 03/28/17 2159  TROPONINI 0.06*    Recent Labs  Lab 03/28/17 1952  TROPIPOC 0.07     BNPNo results for input(s): BNP, PROBNP in the last 168 hours.   DDimer No results for input(s): DDIMER in the last 168 hours.   Radiology    No results found.  Cardiac Studies   Echo 06/25/16- Study Conclusions  - Left ventricle: The cavity size is small. 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 Systolic function was normal. The   estimated ejection fraction was in the range of 50% to 55%.   Distal anterior/apical hypokinesis seen on Definity contrast   images. Doppler parameters are consistent with restrictive   physiology (grade 3 diastolic dysfunction). The E/A ratio is   >2.5. The E/e&' ratio is >20, suggesting markedly elevated LV   filling pressure. - Mitral valve: Mitral  B-notch noted, suggesting elevated LA   pressure. - Left atrium: Moderately dilated. - Right ventricle: The cavity size was mildly dilated. Systolic   function was normal. - Right atrium: Severely dilated. - Tricuspid valve: There was trivial regurgitation. - Pulmonary arteries: PA peak pressure: 19 mm Hg (S). - Inferior vena cava: The vessel was normal in size. The   respirophasic diameter changes were in the normal range (>= 50%),   consistent with normal central venous pressure. - Pericardium, extracardiac: Small pericardial effusion. Features   were not consistent with tamponade physiology.  Impressions:  - Compared to a prior study in 2017, the LVEF has increased to   50-55% with distal anterior/apical hypokinesis as seen on   Definity contrast images.  Patient Profile     47 y.o.obese, AA, male with PMH of chronic diastolic CHF, atrial fibrillation, CKD IV, DM, RA, psudogout, a chronic LLE wound, who was admitted 03/28/17 for the evaluation of bradycardia and syncope.  Assessment & Plan    1. Syncope: patient reports fasting all day. Episode occurred after taking pm pre-meal insulin and having N/V. Events possibly exacerbated by beta blocker use. Likely vasovagal.  - Recommend no driving x6 months  2. Bradycardia:  patient on medications that could have contributed to lower HR. Coreg has been decreased  3. Atrial fibrillation: s/p 2 failed DCCV. - Rate now in 100s-110s - Will likely need to adjust medications as an OP - Cr Cl is 80-he is on full dose Xarelto  4. Acute on chronic diastolic CHF: difficult to determine volume overloaded on exam. I/O actually positive (? Accuracy) and wgt clearly inaccurate. He feels better and is anxious to go home. He may need a Rt heart cath if he has increased SOB as an OP.   5. OSA: patient admits to snoring and wife notes episodes of apnea. Could also be contributing to his persistent Afib  - Patient would benefit from a sleep study  after discharge and this will be arranged at his f/u OV this week.   6. DM: - ISS per primary team  7. RA: recent hospitalization 02/2017 for  RA flare and started on prednisone taper (now off) - Management per primary team  8. Chronic LLE wound: - Management per primary team  9. Stage 3B CRI: - he is followed by Nephrology  Plan: OK for discharge on current medications though would resume his home dose Lasix. Sleep study will need to be arranged as an OP. He has a f/u with Dr Debara Pickett Friday.    For questions or updates, please contact [facility] Please consult [email or web address] for contact info under Cardiology/STEMI.      Angelena Form, PA-C  03/31/2017, 9:31 AM   [phone]  Pt discharged before I got to see him   Had been diuresing well   WIll need close f/u as outpt Needs sleep study Would benefit from MRI if renal function improves  Dorris Carnes

## 2017-03-31 NOTE — Discharge Summary (Signed)
Physician Discharge Summary  John Bailey BDZ:[id number] DOB: Oct 06, 1970 DOA: 03/28/2017  PCP: John Reach, MD  Admit date: 03/28/2017 Discharge date: 03/31/2017  Time spent: 35 minutes  Recommendations for Outpatient Follow-up:   Symptomatic Bradycardia with near syncopal event -Patient not stable asymptomatic. -Coreg 12.5 mg BID -Cardizem CD 180 mg daily -Cardiology concurs with discharge. Has follow-up with Dr. Debara Bailey Cardiology on Friday 18 January per patient. -No driving 6 months per cardiology  Paroxysmal Atrial fibrillation  -See symptomatic bradycardia -Currently in NSR -Xarelto  Acute on Chronic diastolic CHF (per patient base weight 305 pounds/138.3 kg prior to starting steroids) -See bradycardia -LVEF April 2018 34-19%, grade 3 diastolic dysfunction -Patient instructed to continue to weigh himself daily and record weight -Resume home dose Lasix 40 mg BID -Per cardiology may need right heart cath in the future if SOB as outpatient. Filed Weights   03/29/17 0200 03/30/17 0720  Weight: 282 lb 3 oz (128 kg) (!) 336 lb 6.4 oz (152.6 kg)   RIGHT Atrial Mass  -Noted on TEE May 2017: Cardiology suggested cardiac MRI -Cardiology to arrange cardiology if they deem necessary.    Diabetes type 2 uncontrolled with complication -Will restart patient's home insulin dose. -Schedule follow-up in 1-2 weeks with Dr. Tanda Bailey uncontrolled diabetes, morbid obesity, chronic left lower extremity wound, CKD stage IV   RA/pseudogout -PCP to manage  CKD stage IV (recent Baseline Cr as high as 3.5) -Patient sees Dr. Moshe Bailey Nephrologist. Will make an appointment as soon as he gets discharged Recent Labs  Lab 03/28/17 2159 03/28/17 2320 03/29/17 0314 03/29/17 1100 03/30/17 0217 03/31/17 0219  CREATININE 2.78* 2.77* 2.71* 2.66* 2.62* 2.55*   Chronic Left Lower Extremity Wound -Continue care per wound care clinic.   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 PMHx chronic diastolic CHF, CKD stage IV, RA, pseudogout, DM, persistent atrial fibrillation, and chronic left lower extremity wound who was brought to the ER after a near syncopal episode.  Around 5:00PM he took his NovoLog pre-meal dose and was waiting for the food when suddenly he felt nauseated and started vomiting.  His wife noticed that he almost passed out but did not lose consciousness.  This lasted for less than a minute during which he did not have any incontinence of urine or tongue biting.  Patient remembered the episode.  The same thing happened again after half an hour and EMS was called.  When EMS arrived he was found to be bradycardic and was placed on epinephrine infusion very briefly.    In the ER the patient became bradycardic again and Cardiology was consulted and patient was given 1 dose of atropine following which heart rate improved.    During his hospitalization patient medication adjusted by cardiology for bradycardia with near syncopal event. Patient's hospitalization complicated by, acute on chronic diastolic CHF. Cardiology has evaluated patient made appropriate medication adjustments stable for  discharge.      Consultations: Cardiology     Discharge Exam: Vitals:   03/30/17 2320 03/31/17 0355 03/31/17 0359 03/31/17 0939  BP:  128/89  (!) 139/93  Pulse:  99    Resp:      Temp: 97.7 F  (36.5 C)  97.7 F (36.5 C) 97.7 F (36.5 C)  TempSrc: Oral  Oral Oral  SpO2:  95%    Weight:      Height:        General: A/O 4, NAD Cardiovascular: Regular rhythm and rate, negative murmurs rubs gallops, normal S1/S2 Respiratory: Clear to auscultation bilateral negative wheezes, negative crackles   Discharge Instructions   Allergies as of 03/31/2017   No Known Allergies     Medication List    STOP taking these medications   acetaminophen-codeine 300-30 MG tablet Commonly known as:  TYLENOL #3   amoxicillin-clavulanate 875-125 MG tablet Commonly known as:  AUGMENTIN   benzonatate 100 MG capsule Commonly known as:  TESSALON   doxycycline 100 MG tablet Commonly known as:  VIBRA-TABS   predniSONE 10 MG tablet Commonly known as:  DELTASONE     TAKE these medications   albuterol 108 (90 Base) MCG/ACT inhaler Commonly known as:  PROVENTIL HFA;VENTOLIN HFA Inhale 1-2 puffs into the lungs every 6 (six) hours as needed for wheezing.   carvedilol 12.5 MG tablet Commonly known as:  COREG Take 1 tablet (12.5 mg total) by mouth 2 (two) times daily with a meal. What changed:    how much to take  Another medication with the same name was removed. Continue taking this medication, and follow the directions you see here.   celecoxib 100 MG capsule Commonly known as:  CELEBREX Take 100 mg by mouth daily.   colchicine 0.6 MG tablet Take 0.5 tablets (0.3 mg total) by mouth daily.   diltiazem 180 MG 24 hr capsule Commonly known as:  CARDIZEM CD Take 1 capsule (180 mg total) by mouth daily. Start taking on:  04/01/2017 What changed:    medication strength  how much to take   fluticasone 50 MCG/ACT nasal spray Commonly known as:  FLONASE Place 2 sprays into the nose daily.   furosemide 40 MG tablet Commonly known as:  LASIX Take 40 mg by mouth 2 (two) times daily.   gabapentin 300 MG capsule Commonly known as:  NEURONTIN Take 1 capsule (300 mg total) by mouth  2 (two) times daily.   insulin aspart cartridge Commonly known as:  NOVOLOG PENFILL Inject 5 Units into the skin 3 (three) times daily with meals.   insulin aspart 100 UNIT/ML FlexPen Commonly known as:  NOVOLOG FLEXPEN Inject 0-12 Units into the skin 3 (three) times daily as needed for high blood sugar. Sliding Scale <70, initiate hypoglycemia protocol, 70-100=0 units, 110-199= 3 units, 200-250=5 units, 251-300=7 units, 301-350=10 units, 351-400=12 units, >400=14 units. Call MD if >450   Insulin Glargine 100 UNIT/ML Solostar Pen Commonly known as:  LANTUS Inject 25 Units into the skin daily at 10 pm.   Insulin Pen Needle 31G X 6 MM Misc 1 Device by Does not apply route 2 (two) times daily.   potassium chloride SA 20 MEQ tablet Commonly known as:  KLOR-CON M20 Take 1 tablet (20 mEq total) by mouth daily.   rivaroxaban 20 MG Tabs tablet Commonly known as:  XARELTO take 1 tablet by mouth once daily WITH SUPPER What changed:    how much to take  how to take this  when to take this  additional instructions      No Known Allergies Follow-up Information    Hilty, Nadean Corwin, MD Follow up.   Specialty:  Cardiology Contact information: [address] [place] [address] [phone]        Please follow up.   Why:  Jan. 18,2019 @9 :15 AM       John Reach, MD Follow up.   Specialty:  Internal Medicine Contact information: [place]. [place] [address] [phone]            The results of significant diagnostics from this hospitalization (including imaging, microbiology, ancillary and laboratory) are listed below for reference.    Significant Diagnostic Studies: Dg Chest 2 View  Result Date: 03/06/2017 CLINICAL DATA:  47 year old male: Code sepsis EXAM: CHEST  2 VIEW COMPARISON:  Prior chest x-ray 12/01/2016 FINDINGS: Similar appearance of marked cardiomegaly. No focal airspace consolidation to suggest a bacterial pneumonia.  Streaky airspace opacities in the lung bases are slightly more prominent than prior but favored to reflect atelectasis and chronic bronchitis. No pleural effusion or pneumothorax. No acute osseous abnormality. IMPRESSION: 1. Stable marked cardiomegaly, chronic bronchitic changes and bibasilar atelectasis. Electronically Signed   By: Jacqulynn Cadet M.D.   On: 03/06/2017 18:51   Dg Tibia/fibula Left  Result Date: 03/06/2017 CLINICAL DATA:  Initial evaluation for soft tissue wound to left leg. Hypotension. EXAM: LEFT TIBIA AND FIBULA - 2 VIEW COMPARISON:  None. FINDINGS: No acute fracture or dislocation. Limited views of the knee and ankle are grossly unremarkable. Soft tissue wound present at the lateral aspect of the mid/lower left leg. No soft tissue emphysema radiopaque foreign body. No other soft tissue abnormality. IMPRESSION: 1. Soft tissue wound at the lateral aspect of the left lower leg. No associated emphysema or other abnormality. 2. No acute osseous abnormality about the left tibia/fibula. No evidence for acute osteomyelitis. Electronically Signed   By: Jeannine Boga M.D.   On: 03/06/2017 18:54    Microbiology: No results found for this or any previous visit (from the past 240 hour(s)).   Labs: Basic Metabolic Panel: Recent Labs  Lab 03/28/17 1853  03/28/17 2320 03/29/17 0314 03/29/17 1100 03/30/17 0217 03/31/17 0219  NA 135   < > 136 137 137 136 138  K 4.2   < > 4.8 4.3 4.3 4.1 4.3  CL 106   < > 104 105 103 103 103  CO2 19*   < > 20* 22 22 23 23   GLUCOSE 107*   < > 109* 120* 149* 156* 233*  BUN 54*   < > 57* 55* 53* 51* 47*  CREATININE 2.78*   < > 2.77* 2.71* 2.66* 2.62* 2.55*  CALCIUM 7.8*   < > 8.5* 8.5* 8.4* 8.2* 8.5*  MG 1.5*  --   --   --   --   --   --    < > = values in this interval not displayed.   Liver Function Tests: Recent Labs  Lab 03/28/17 1853 03/29/17 0314 03/30/17 0217  AST 39 26 14*  ALT 27 29 25   ALKPHOS 116 129* 120  BILITOT 0.8 0.9  0.7  PROT 5.3* 5.6* 5.7*  ALBUMIN 2.4* 2.7* 2.5*   No results for input(s): LIPASE, AMYLASE in the last 168 hours. No results for input(s): AMMONIA in the last 168 hours. CBC: Recent Labs  Lab 03/28/17 1853 03/28/17 1952 03/29/17 0314 03/30/17 0217 03/31/17 0219  WBC 8.7  --  7.0 7.4 6.0  NEUTROABS 6.7  --  4.9  --   --   HGB 9.5* 9.2* 10.0* 10.3* 9.5*  HCT 29.7* 27.0* 30.1* 32.4* 29.6*  MCV 90.0  --  89.1 89.8 89.7  PLT 159  --  163 176 167   Cardiac Enzymes: Recent Labs  Lab 03/28/17 2159  TROPONINI 0.06*   BNP: BNP (last 3 results) Recent Labs    12/02/16 1405 03/06/17 1800  BNP 554.8* 791.5*    ProBNP (last 3 results) No results for input(s): PROBNP in the last 8760 hours.  CBG: Recent Labs  Lab 03/30/17 0831 03/30/17 1315 03/30/17 1806 03/30/17 2125 03/31/17 0819  GLUCAP 139* 151* 180* 193* 178*       Signed:  Dia Crawford, MD [facility] [phone] pager

## 2017-04-01 DIAGNOSIS — I87332 Chronic venous hypertension (idiopathic) with ulcer and inflammation of left lower extremity: Secondary | ICD-10-CM | POA: Diagnosis not present

## 2017-04-01 DIAGNOSIS — I890 Lymphedema, not elsewhere classified: Secondary | ICD-10-CM | POA: Diagnosis not present

## 2017-04-01 DIAGNOSIS — L97822 Non-pressure chronic ulcer of other part of left lower leg with fat layer exposed: Secondary | ICD-10-CM | POA: Diagnosis not present

## 2017-04-01 DIAGNOSIS — E1140 Type 2 diabetes mellitus with diabetic neuropathy, unspecified: Secondary | ICD-10-CM | POA: Diagnosis not present

## 2017-04-01 DIAGNOSIS — I509 Heart failure, unspecified: Secondary | ICD-10-CM | POA: Diagnosis not present

## 2017-04-01 DIAGNOSIS — I130 Hypertensive heart and chronic kidney disease with heart failure and stage 1 through stage 4 chronic kidney disease, or unspecified chronic kidney disease: Secondary | ICD-10-CM | POA: Diagnosis not present

## 2017-04-01 DIAGNOSIS — I2510 Atherosclerotic heart disease of native coronary artery without angina pectoris: Secondary | ICD-10-CM | POA: Diagnosis not present

## 2017-04-01 DIAGNOSIS — E11622 Type 2 diabetes mellitus with other skin ulcer: Secondary | ICD-10-CM | POA: Diagnosis not present

## 2017-04-01 DIAGNOSIS — E1122 Type 2 diabetes mellitus with diabetic chronic kidney disease: Secondary | ICD-10-CM | POA: Diagnosis not present

## 2017-04-01 DIAGNOSIS — N189 Chronic kidney disease, unspecified: Secondary | ICD-10-CM | POA: Diagnosis not present

## 2017-04-02 ENCOUNTER — Encounter: Payer: Self-pay | Admitting: Internal Medicine

## 2017-04-02 ENCOUNTER — Ambulatory Visit: Payer: BLUE CROSS/BLUE SHIELD | Admitting: Internal Medicine

## 2017-04-02 DIAGNOSIS — R5383 Other fatigue: Secondary | ICD-10-CM

## 2017-04-02 DIAGNOSIS — G4719 Other hypersomnia: Secondary | ICD-10-CM | POA: Insufficient documentation

## 2017-04-02 DIAGNOSIS — I480 Paroxysmal atrial fibrillation: Secondary | ICD-10-CM | POA: Diagnosis not present

## 2017-04-02 DIAGNOSIS — R5381 Other malaise: Secondary | ICD-10-CM | POA: Insufficient documentation

## 2017-04-02 DIAGNOSIS — I5032 Chronic diastolic (congestive) heart failure: Secondary | ICD-10-CM

## 2017-04-02 NOTE — Patient Instructions (Signed)
Medication Instructions:   Continue current medications  Labwork:  NONE  Testing/Procedures:  Your physician has recommended that you have a sleep study @ [facility]. This test records several body functions during sleep, including: brain activity, eye movement, oxygen and carbon dioxide blood levels, heart rate and rhythm, breathing rate and rhythm, the flow of air through your mouth and nose, snoring, body muscle movements, and chest and belly movement.  Follow-Up:  Your physician recommends that you schedule a follow-up appointment in: [place] with Dr. Debara Pickett.   If you need a refill on your cardiac medications before your next appointment, please call your pharmacy.  Any Other Special Instructions Will Be Listed Below (If Applicable).  Please monitor your DAILY WEIGHTS at home. If you gain more than 2-3lbs in 24 hours or 5lbs in 1 week, CALL OUR OFFICE

## 2017-04-02 NOTE — Progress Notes (Signed)
OFFICE NOTE  Chief Complaint:  Hospital follow-up  Primary Care Physician: [name], MD  HPI:  John Bailey is a 47 y.o. male patient, previously followed by Dr. Acie Fredrickson, who has requested to see me after he week-long hospitalization for volume overload, atrial flutter with RVR and acute systolic congestive heart failure - LVEF now 35%. He also developed acute kidney injury and is being followed by Dr. Moshe Cipro. Past medical history is also significant for insulin-dependent diabetes, morbid obesity, LVH and dyslipidemia. He was admitted for atrial flutter with rapid ventricular response and underwent TEE cardioversion on 08/01/2015 which showed global hypokinesis and EF 25-30%. Surface echo a day prior to that showed EF of 35-40%. He was successfully cardioverted and has maintained sinus rhythm since that time. He had marked volume overload and diuresed more than 25 pounds. He continues to lose weight in fact his discharge weight was 136 kg and is currently 127 kg. He denies chest pain or shortness of breath. His INR was tested today as he is on warfarin and found to be 1.7. Adjustments were made to his Coumadin. Blood pressure remains elevated.  11/11/2015  John Bailey returns today for follow-up. He was seen last 3 months ago in hospital follow-up and since that time his been doing well. He denies any worsening shortness of breath or chest pain. Weight is actually up about 3 pounds since I last saw him. He was recently seen by Dr. Moshe Cipro at [facility]. She noted that Lyme status appears to be somewhat heavy despite being on high-dose Lasix, but he may be fairly euvolemic at this point. His hemoglobin A1c was tested and elevated at 7.6. Creatinine however did look somewhat better at 1.74. He was placed on warfarin because of atrial flutter but has remained in sinus rhythm since discharge. I believe warfarin was chosen due to his chronic kidney disease, but we  have been monitoring his INRs since discharge and over the past 3 months he has been rarely between 2 and 3 for therapeutic INR. He reports compliance with medications and says that he takes them regularly but may forget to take them on time yet he still remembers to take the medicines, this is despite some concern about chronic noncompliance. My concern is that he is not being adequately treated with warfarin as were unable to achieve a therapeutic INR despite being on essentially 10 mg of warfarin daily. Given that he has had improvement in his renal function, we discussed the possibility of going on to a novel oral anticoagulant.  03/20/2016  John Bailey returns today for follow-up. We had planned on reviewing a repeat echo today however that was not yet obtained. He recently saw his nephrologist and was noted to be hypertensive with excess volume and was started on metolazone in addition to his Lasix. Is not clear whether his LV function has improved. He is maintaining sinus rhythm. He denies any chest pain or worsening shortness of breath. He reported that his BiDil is no longer going to be [employer]. He is close to running out of that medication. He seems to be tolerating Xarelto without any significant bleeding problems.  07/31/2016  John Bailey was seen in follow-up today he reports that he is not any more recurrent atrial fibrillation is aware of. He denies any word worsening shortness of breath. Blood pressure today is a little bit elevated however says is better controlled at home. Weight is up actually over 20 pounds. He  does not note any edema rather he's not been able to exercise because of problems with his joints and has been on steroids therefore he says his appetite is increased. He denies any bleeding problems on Xarelto. He is been followed closely by Dr. Moshe Cipro who is adjusting his diuretics. Creatinine is actually improved.  04/02/2017  John Bailey returns today from  hospital follow-up -this is a transition of care visit.[name]  He was just discharged 2 days ago.  He was hospitalized for near syncopal event.  This was thought to be due to a combination of factors including possible bradycardia.  His carvedilol and Cardizem were decreased.  He also remains markedly volume overloaded.  His Lasix was restarted and he is diuresing.  His weight however is measured today 5 pounds higher than discharge.  He says he thought the scales were inaccurate the hospital.  He does not measure his weight on a daily basis and I encouraged him to do that.  There was concern for obstructive sleep apnea in the hospital based on oximetry and we will arrange for a formal sleep study.  He does have significant chronic kidney disease with creatinine of 2.6-2.7.  He is followed by Dr. Moshe Cipro.  He will likely need an increase in his diuretics.  He should contact their office for an appointment.  Unfortunately given his elevated creatinine, is not a candidate for an MRI with contrast.  Overall he feels much better than when he was hospitalized.  PMHx:  Past Medical History:  Diagnosis Date  . Atrial flutter with rapid ventricular response ([place]) 08/06/2015   a. s/p DCCV in 07/2015 with recurrent atrial fibrillation and DCCV in 12/2016. Initially successful but noted to be back in atrial fibrillation within 2 weeks of DCCV.   [name] Chronic diastolic (congestive) heart failure (HCC)    a. EF 50-55% by echo in 06/2016.  . Diabetes ([place])   . Hypertension     Past Surgical History:  Procedure Laterality Date  . CARDIOVERSION N/A 08/01/2015   Procedure: CARDIOVERSION;  Surgeon: Josue Hector, MD;  Location: [facility] ENDOSCOPY;  Service: Cardiovascular;  Laterality: N/A;  . CARDIOVERSION N/A 12/18/2016   Procedure: CARDIOVERSION;  Surgeon: [name], MD;  Location: [place];  Service: Cardiovascular;  Laterality: N/A;  . CHOLECYSTECTOMY    . TEE WITHOUT CARDIOVERSION N/A 08/01/2015    Procedure: TRANSESOPHAGEAL ECHOCARDIOGRAM (TEE);  Surgeon: Josue Hector, MD;  Location: [facility] ENDOSCOPY;  Service: Cardiovascular;  Laterality: N/A;    FAMHx:  Family History  Problem Relation Age of Onset  . Heart disease Mother   . Hyperlipidemia Father   . Hypertension Father     SOCHx:   reports that  has never smoked. he has never used smokeless tobacco. He reports that he does not drink alcohol or use drugs.  ALLERGIES:  No Known Allergies  ROS: Pertinent items noted in HPI and remainder of comprehensive ROS otherwise negative.  HOME MEDS: Current Outpatient Medications  Medication Sig Dispense Refill  . albuterol (PROVENTIL HFA;VENTOLIN HFA) 108 (90 BASE) MCG/ACT inhaler Inhale 1-2 puffs into the lungs every 6 (six) hours as needed for wheezing. 1 Inhaler 3  . carvedilol (COREG) 12.5 MG tablet Take 1 tablet (12.5 mg total) by mouth 2 (two) times daily with a meal. 2 tablet 0  . celecoxib (CELEBREX) 100 MG capsule Take 100 mg by mouth daily.  0  . colchicine 0.6 MG tablet Take 0.5 tablets (0.3 mg total) by mouth daily. 30 tablet 0  .  diltiazem (CARDIZEM CD) 180 MG 24 hr capsule Take 1 capsule (180 mg total) by mouth daily. 30 capsule 0  . fluticasone (FLONASE) 50 MCG/ACT nasal spray Place 2 sprays into the nose daily.     . furosemide (LASIX) 40 MG tablet Take 40 mg by mouth 2 (two) times daily.  3  . gabapentin (NEURONTIN) 300 MG capsule Take 1 capsule (300 mg total) by mouth 2 (two) times daily. 60 capsule 0  . insulin aspart (NOVOLOG FLEXPEN) 100 UNIT/ML FlexPen Inject 0-12 Units into the skin 3 (three) times daily as needed for high blood sugar. Sliding Scale <70, initiate hypoglycemia protocol, 70-100=0 units, 110-199= 3 units, 200-250=5 units, 251-300=7 units, 301-350=10 units, 351-400=12 units, >400=14 units. Call MD if >450 15 mL 11  . Insulin Glargine (LANTUS) 100 UNIT/ML Solostar Pen Inject 25 Units into the skin daily at 10 pm. 15 mL 11  . Insulin Pen Needle 31G X 6  MM MISC 1 Device by Does not apply route 2 (two) times daily. 60 each 3  . potassium chloride SA (KLOR-CON M20) 20 MEQ tablet Take 1 tablet (20 mEq total) by mouth daily. 90 tablet 3  . rivaroxaban (XARELTO) 20 MG TABS tablet take 1 tablet by mouth once daily WITH SUPPER (Patient taking differently: Take 20 mg by mouth at bedtime. ) 30 tablet 0   No current facility-administered medications for this visit.     LABS/IMAGING: No results found for this or any previous visit (from the past 48 hour(s)). No results found.  WEIGHTS: Wt Readings from Last 3 Encounters:  04/02/17 (!) 341 lb 9.6 oz (154.9 kg)  03/30/17 (!) 336 lb 6.4 oz (152.6 kg)  03/09/17 (!) 304 lb 5 oz (138 kg)    VITALS: BP 140/88   Pulse 88   Ht 6' (1.829 m)   Wt (!) 341 lb 9.6 oz (154.9 kg)   BMI 46.33 kg/m   EXAM: General appearance: alert, no distress and morbidly obese Neck: no carotid bruit and no JVD Lungs: clear to auscultation bilaterally Heart: regular rate and rhythm, S1, S2 normal, no murmur, click, rub or gallop Abdomen: soft, non-tender; bowel sounds normal; no masses,  no organomegaly and obese Extremities: edema 3+ edema - compression stockings in place Pulses: 2+ and symmetric Skin: Skin color, texture, turgor normal. No rashes or lesions Neurologic: Grossly normal Psych: Pleasant  EKG: Deferred  ASSESSMENT: 1. Near syncope,?bradycardia-related 2. Possible OSA 3. Paroxysmal atrial flutter status post TEE cardioversion 4. Nonischemic cardiomyopathy EF 25-35% 5. Chronic anticoagulation on Xarelto - CHADSVASC score of 5 6. Acute kidney injury 7. Type 2 diabetes 8. Dyslipidemia 9. Hypertensive heart disease with heart failure  PLAN: 1.   John Bailey had significant weight gain and fluid retention.  He is restarted on his home diuretic dose however weight seems to be up today.  He is not sure if these weights are accurate.  I have encouraged him to follow his weight closely at home and if he  is continuing to gain weight we will need to readjust his diuretics.  He may need higher doses based on his creatinine which has been elevated between 2.6 and 2.7.  We will arrange for an outpatient sleep study as there is a high likelihood of obstructive sleep apnea.  He appears to be in sinus rhythm and heart rate is in the 80s.  He will need to follow-up with his nephrologist.  Follow-up in 3 months.  Pixie Casino, MD, [facility], FACP  [facility]  [place] Director of the Advanced Lipid Disorders &  Cardiovascular Risk Reduction Clinic Diplomate of the American Board of Clinical Lipidology Attending Cardiologist  Direct Dial: [phone]  Fax: [phone]  Website:  [email or web address] 04/02/2017, 9:49 AM

## 2017-04-08 DIAGNOSIS — I87332 Chronic venous hypertension (idiopathic) with ulcer and inflammation of left lower extremity: Secondary | ICD-10-CM | POA: Diagnosis not present

## 2017-04-15 ENCOUNTER — Telehealth: Payer: Self-pay | Admitting: Internal Medicine

## 2017-04-15 DIAGNOSIS — I87332 Chronic venous hypertension (idiopathic) with ulcer and inflammation of left lower extremity: Secondary | ICD-10-CM | POA: Diagnosis not present

## 2017-04-15 NOTE — Telephone Encounter (Signed)
Spoke with pt, he reports his weight is up to 347 lb, which is 7 lbs from when he was here. He reports his abdomen and upper legs are swollen. He reports he has not been able to get in with dr Hillery Hunter yet but he is going to call them today. Patient instructed to take 80 mg of furosemide now for his pm dose today. Then he was instructed to take 80 mg the next 3 mornings. He will call back by the first of the week if no progress is made. Pt agreed with this plan.

## 2017-04-15 NOTE — Telephone Encounter (Signed)
Pt c/o swelling: STAT is pt has developed SOB within 24 hours  1) How much weight have you gained and in what time span? 5 lbs in 2-3 days   2) If swelling, where is the swelling located? Thigh area   3) Are you currently taking a fluid pill? yes  4) Are you currently SOB? no  5) Do you have a log of your daily weights (if so, list)? n/a  6) Have you gained 3 pounds in a day or 5 pounds in a week? yes  7) Have you traveled recently? no

## 2017-04-22 ENCOUNTER — Other Ambulatory Visit: Payer: Self-pay

## 2017-04-22 ENCOUNTER — Encounter (HOSPITAL_BASED_OUTPATIENT_CLINIC_OR_DEPARTMENT_OTHER): Payer: BLUE CROSS/BLUE SHIELD | Attending: Internal Medicine

## 2017-04-22 ENCOUNTER — Emergency Department (HOSPITAL_COMMUNITY): Payer: BLUE CROSS/BLUE SHIELD

## 2017-04-22 ENCOUNTER — Encounter (HOSPITAL_COMMUNITY): Payer: Self-pay

## 2017-04-22 ENCOUNTER — Inpatient Hospital Stay (HOSPITAL_COMMUNITY)
Admission: EM | Admit: 2017-04-22 | Discharge: 2017-05-13 | DRG: 252 | Disposition: A | Discharge status: Rehabilitation Facility | Payer: BLUE CROSS/BLUE SHIELD | Attending: Internal Medicine | Admitting: Internal Medicine

## 2017-04-22 DIAGNOSIS — N185 Chronic kidney disease, stage 5: Secondary | ICD-10-CM | POA: Diagnosis not present

## 2017-04-22 DIAGNOSIS — I132 Hypertensive heart and chronic kidney disease with heart failure and with stage 5 chronic kidney disease, or end stage renal disease: Secondary | ICD-10-CM | POA: Diagnosis present

## 2017-04-22 DIAGNOSIS — B009 Herpesviral infection, unspecified: Secondary | ICD-10-CM | POA: Diagnosis not present

## 2017-04-22 DIAGNOSIS — R0989 Other specified symptoms and signs involving the circulatory and respiratory systems: Secondary | ICD-10-CM | POA: Diagnosis not present

## 2017-04-22 DIAGNOSIS — I119 Hypertensive heart disease without heart failure: Secondary | ICD-10-CM | POA: Diagnosis present

## 2017-04-22 DIAGNOSIS — I87332 Chronic venous hypertension (idiopathic) with ulcer and inflammation of left lower extremity: Secondary | ICD-10-CM | POA: Diagnosis present

## 2017-04-22 DIAGNOSIS — Z7951 Long term (current) use of inhaled steroids: Secondary | ICD-10-CM

## 2017-04-22 DIAGNOSIS — I428 Other cardiomyopathies: Secondary | ICD-10-CM | POA: Diagnosis present

## 2017-04-22 DIAGNOSIS — R197 Diarrhea, unspecified: Secondary | ICD-10-CM | POA: Diagnosis not present

## 2017-04-22 DIAGNOSIS — I482 Chronic atrial fibrillation, unspecified: Secondary | ICD-10-CM

## 2017-04-22 DIAGNOSIS — N186 End stage renal disease: Secondary | ICD-10-CM

## 2017-04-22 DIAGNOSIS — Z01810 Encounter for preprocedural cardiovascular examination: Secondary | ICD-10-CM | POA: Diagnosis not present

## 2017-04-22 DIAGNOSIS — I5043 Acute on chronic combined systolic (congestive) and diastolic (congestive) heart failure: Secondary | ICD-10-CM | POA: Diagnosis not present

## 2017-04-22 DIAGNOSIS — I110 Hypertensive heart disease with heart failure: Secondary | ICD-10-CM | POA: Diagnosis not present

## 2017-04-22 DIAGNOSIS — R778 Other specified abnormalities of plasma proteins: Secondary | ICD-10-CM | POA: Diagnosis present

## 2017-04-22 DIAGNOSIS — IMO0001 Reserved for inherently not codable concepts without codable children: Secondary | ICD-10-CM

## 2017-04-22 DIAGNOSIS — E11622 Type 2 diabetes mellitus with other skin ulcer: Secondary | ICD-10-CM | POA: Insufficient documentation

## 2017-04-22 DIAGNOSIS — E875 Hyperkalemia: Secondary | ICD-10-CM | POA: Diagnosis present

## 2017-04-22 DIAGNOSIS — E1122 Type 2 diabetes mellitus with diabetic chronic kidney disease: Secondary | ICD-10-CM | POA: Diagnosis present

## 2017-04-22 DIAGNOSIS — N170 Acute kidney failure with tubular necrosis: Secondary | ICD-10-CM | POA: Diagnosis not present

## 2017-04-22 DIAGNOSIS — E876 Hypokalemia: Secondary | ICD-10-CM | POA: Diagnosis not present

## 2017-04-22 DIAGNOSIS — E871 Hypo-osmolality and hyponatremia: Secondary | ICD-10-CM | POA: Diagnosis present

## 2017-04-22 DIAGNOSIS — G5602 Carpal tunnel syndrome, left upper limb: Secondary | ICD-10-CM | POA: Diagnosis not present

## 2017-04-22 DIAGNOSIS — L97822 Non-pressure chronic ulcer of other part of left lower leg with fat layer exposed: Secondary | ICD-10-CM | POA: Diagnosis not present

## 2017-04-22 DIAGNOSIS — E1140 Type 2 diabetes mellitus with diabetic neuropathy, unspecified: Secondary | ICD-10-CM | POA: Diagnosis not present

## 2017-04-22 DIAGNOSIS — D631 Anemia in chronic kidney disease: Secondary | ICD-10-CM | POA: Diagnosis present

## 2017-04-22 DIAGNOSIS — M175 Other unilateral secondary osteoarthritis of knee: Secondary | ICD-10-CM | POA: Diagnosis not present

## 2017-04-22 DIAGNOSIS — I361 Nonrheumatic tricuspid (valve) insufficiency: Secondary | ICD-10-CM | POA: Diagnosis not present

## 2017-04-22 DIAGNOSIS — N179 Acute kidney failure, unspecified: Secondary | ICD-10-CM | POA: Diagnosis present

## 2017-04-22 DIAGNOSIS — Z79899 Other long term (current) drug therapy: Secondary | ICD-10-CM

## 2017-04-22 DIAGNOSIS — L89150 Pressure ulcer of sacral region, unstageable: Secondary | ICD-10-CM | POA: Diagnosis not present

## 2017-04-22 DIAGNOSIS — Z6841 Body Mass Index (BMI) 40.0 and over, adult: Secondary | ICD-10-CM

## 2017-04-22 DIAGNOSIS — M792 Neuralgia and neuritis, unspecified: Secondary | ICD-10-CM | POA: Diagnosis not present

## 2017-04-22 DIAGNOSIS — E1151 Type 2 diabetes mellitus with diabetic peripheral angiopathy without gangrene: Secondary | ICD-10-CM | POA: Diagnosis present

## 2017-04-22 DIAGNOSIS — I1310 Hypertensive heart and chronic kidney disease without heart failure, with stage 1 through stage 4 chronic kidney disease, or unspecified chronic kidney disease: Secondary | ICD-10-CM | POA: Diagnosis not present

## 2017-04-22 DIAGNOSIS — M25569 Pain in unspecified knee: Secondary | ICD-10-CM

## 2017-04-22 DIAGNOSIS — N184 Chronic kidney disease, stage 4 (severe): Secondary | ICD-10-CM

## 2017-04-22 DIAGNOSIS — N19 Unspecified kidney failure: Secondary | ICD-10-CM | POA: Diagnosis not present

## 2017-04-22 DIAGNOSIS — R748 Abnormal levels of other serum enzymes: Secondary | ICD-10-CM | POA: Diagnosis present

## 2017-04-22 DIAGNOSIS — R7309 Other abnormal glucose: Secondary | ICD-10-CM | POA: Diagnosis not present

## 2017-04-22 DIAGNOSIS — R0902 Hypoxemia: Secondary | ICD-10-CM

## 2017-04-22 DIAGNOSIS — Z7901 Long term (current) use of anticoagulants: Secondary | ICD-10-CM

## 2017-04-22 DIAGNOSIS — D638 Anemia in other chronic diseases classified elsewhere: Secondary | ICD-10-CM | POA: Diagnosis not present

## 2017-04-22 DIAGNOSIS — Z794 Long term (current) use of insulin: Secondary | ICD-10-CM | POA: Diagnosis not present

## 2017-04-22 DIAGNOSIS — E785 Hyperlipidemia, unspecified: Secondary | ICD-10-CM | POA: Diagnosis not present

## 2017-04-22 DIAGNOSIS — Z992 Dependence on renal dialysis: Secondary | ICD-10-CM | POA: Diagnosis not present

## 2017-04-22 DIAGNOSIS — I480 Paroxysmal atrial fibrillation: Secondary | ICD-10-CM | POA: Diagnosis present

## 2017-04-22 DIAGNOSIS — R7989 Other specified abnormal findings of blood chemistry: Secondary | ICD-10-CM

## 2017-04-22 DIAGNOSIS — Z9049 Acquired absence of other specified parts of digestive tract: Secondary | ICD-10-CM

## 2017-04-22 DIAGNOSIS — I5023 Acute on chronic systolic (congestive) heart failure: Secondary | ICD-10-CM | POA: Diagnosis not present

## 2017-04-22 DIAGNOSIS — I130 Hypertensive heart and chronic kidney disease with heart failure and stage 1 through stage 4 chronic kidney disease, or unspecified chronic kidney disease: Secondary | ICD-10-CM

## 2017-04-22 DIAGNOSIS — D72829 Elevated white blood cell count, unspecified: Secondary | ICD-10-CM | POA: Diagnosis not present

## 2017-04-22 DIAGNOSIS — K5900 Constipation, unspecified: Secondary | ICD-10-CM | POA: Diagnosis not present

## 2017-04-22 DIAGNOSIS — R0602 Shortness of breath: Secondary | ICD-10-CM

## 2017-04-22 DIAGNOSIS — Z8349 Family history of other endocrine, nutritional and metabolic diseases: Secondary | ICD-10-CM

## 2017-04-22 DIAGNOSIS — I2510 Atherosclerotic heart disease of native coronary artery without angina pectoris: Secondary | ICD-10-CM | POA: Insufficient documentation

## 2017-04-22 DIAGNOSIS — D62 Acute posthemorrhagic anemia: Secondary | ICD-10-CM | POA: Diagnosis not present

## 2017-04-22 DIAGNOSIS — I890 Lymphedema, not elsewhere classified: Secondary | ICD-10-CM | POA: Diagnosis not present

## 2017-04-22 DIAGNOSIS — R5381 Other malaise: Secondary | ICD-10-CM | POA: Diagnosis not present

## 2017-04-22 DIAGNOSIS — E1129 Type 2 diabetes mellitus with other diabetic kidney complication: Secondary | ICD-10-CM | POA: Diagnosis not present

## 2017-04-22 DIAGNOSIS — M069 Rheumatoid arthritis, unspecified: Secondary | ICD-10-CM

## 2017-04-22 DIAGNOSIS — Z789 Other specified health status: Secondary | ICD-10-CM

## 2017-04-22 DIAGNOSIS — I5033 Acute on chronic diastolic (congestive) heart failure: Secondary | ICD-10-CM

## 2017-04-22 DIAGNOSIS — I313 Pericardial effusion (noninflammatory): Secondary | ICD-10-CM | POA: Diagnosis present

## 2017-04-22 DIAGNOSIS — N2581 Secondary hyperparathyroidism of renal origin: Secondary | ICD-10-CM | POA: Diagnosis present

## 2017-04-22 DIAGNOSIS — I4510 Unspecified right bundle-branch block: Secondary | ICD-10-CM | POA: Diagnosis present

## 2017-04-22 DIAGNOSIS — Z9119 Patient's noncompliance with other medical treatment and regimen: Secondary | ICD-10-CM | POA: Diagnosis not present

## 2017-04-22 DIAGNOSIS — I4891 Unspecified atrial fibrillation: Secondary | ICD-10-CM | POA: Diagnosis not present

## 2017-04-22 DIAGNOSIS — M0579 Rheumatoid arthritis with rheumatoid factor of multiple sites without organ or systems involvement: Secondary | ICD-10-CM | POA: Diagnosis not present

## 2017-04-22 DIAGNOSIS — R6510 Systemic inflammatory response syndrome (SIRS) of non-infectious origin without acute organ dysfunction: Secondary | ICD-10-CM | POA: Diagnosis not present

## 2017-04-22 DIAGNOSIS — I120 Hypertensive chronic kidney disease with stage 5 chronic kidney disease or end stage renal disease: Secondary | ICD-10-CM | POA: Insufficient documentation

## 2017-04-22 DIAGNOSIS — Z8249 Family history of ischemic heart disease and other diseases of the circulatory system: Secondary | ICD-10-CM

## 2017-04-22 DIAGNOSIS — Z419 Encounter for procedure for purposes other than remedying health state, unspecified: Secondary | ICD-10-CM

## 2017-04-22 DIAGNOSIS — R52 Pain, unspecified: Secondary | ICD-10-CM | POA: Diagnosis not present

## 2017-04-22 DIAGNOSIS — M25561 Pain in right knee: Secondary | ICD-10-CM | POA: Diagnosis not present

## 2017-04-22 DIAGNOSIS — R739 Hyperglycemia, unspecified: Secondary | ICD-10-CM | POA: Diagnosis not present

## 2017-04-22 DIAGNOSIS — E1142 Type 2 diabetes mellitus with diabetic polyneuropathy: Secondary | ICD-10-CM | POA: Diagnosis not present

## 2017-04-22 DIAGNOSIS — T380X5A Adverse effect of glucocorticoids and synthetic analogues, initial encounter: Secondary | ICD-10-CM | POA: Diagnosis not present

## 2017-04-22 LAB — CBC WITH DIFFERENTIAL/PLATELET
Basophils Absolute: 0.1 10*3/uL (ref 0.0–0.1)
Basophils Relative: 1 %
Eosinophils Absolute: 0.5 10*3/uL (ref 0.0–0.7)
Eosinophils Relative: 6 %
HEMATOCRIT: 32.4 % — AB (ref 39.0–52.0)
HEMOGLOBIN: 10.1 g/dL — AB (ref 13.0–17.0)
LYMPHS PCT: 16 %
Lymphs Abs: 1.4 10*3/uL (ref 0.7–4.0)
MCH: 28.5 pg (ref 26.0–34.0)
MCHC: 31.2 g/dL (ref 30.0–36.0)
MCV: 91.5 fL (ref 78.0–100.0)
MONOS PCT: 13 %
Monocytes Absolute: 1.1 10*3/uL — ABNORMAL HIGH (ref 0.1–1.0)
NEUTROS ABS: 5.7 10*3/uL (ref 1.7–7.7)
NEUTROS PCT: 64 %
Platelets: 344 10*3/uL (ref 150–400)
RBC: 3.54 MIL/uL — AB (ref 4.22–5.81)
RDW: 18.2 % — ABNORMAL HIGH (ref 11.5–15.5)
WBC: 8.8 10*3/uL (ref 4.0–10.5)

## 2017-04-22 LAB — COMPREHENSIVE METABOLIC PANEL
ALK PHOS: 101 U/L (ref 38–126)
ALT: 10 U/L — AB (ref 17–63)
AST: 12 U/L — AB (ref 15–41)
Albumin: 2.8 g/dL — ABNORMAL LOW (ref 3.5–5.0)
Anion gap: 11 (ref 5–15)
BUN: 51 mg/dL — AB (ref 6–20)
CALCIUM: 8.5 mg/dL — AB (ref 8.9–10.3)
CHLORIDE: 107 mmol/L (ref 101–111)
CO2: 22 mmol/L (ref 22–32)
CREATININE: 2.65 mg/dL — AB (ref 0.61–1.24)
GFR calc non Af Amer: 28 mL/min — ABNORMAL LOW (ref >60)
GFR, EST AFRICAN AMERICAN: 32 mL/min — AB (ref >60)
Glucose, Bld: 97 mg/dL (ref 65–99)
Potassium: 4 mmol/L (ref 3.5–5.1)
Sodium: 140 mmol/L (ref 135–145)
Total Bilirubin: 0.6 mg/dL (ref 0.3–1.2)
Total Protein: 5.8 g/dL — ABNORMAL LOW (ref 6.5–8.1)

## 2017-04-22 LAB — I-STAT TROPONIN, ED: Troponin i, poc: 0.06 ng/mL (ref 0.00–0.08)

## 2017-04-22 LAB — BRAIN NATRIURETIC PEPTIDE: B NATRIURETIC PEPTIDE 5: 531.3 pg/mL — AB (ref 0.0–100.0)

## 2017-04-22 MED ORDER — FUROSEMIDE 10 MG/ML IJ SOLN
80.0000 mg | Freq: Once | INTRAMUSCULAR | Status: AC
  Administered 2017-04-22: 80 mg via INTRAVENOUS
  Filled 2017-04-22: qty 8

## 2017-04-22 NOTE — ED Provider Notes (Signed)
[facility] EMERGENCY DEPARTMENT Provider Note  CSN: [id number] Arrival date & time: 04/22/17 1637  Chief Complaint(s) Generalized body swelling  HPI John Bailey is a 47 y.o. male with a history of hypertension, diabetes, A. fib/atrial flutter on diltiazem and Xarelto, chronic diastolic heart failure on Lasix, rheumatoid arthritis who was recently started on prednisone 2 weeks ago.  At that time patient was told to discontinue Lasix.  Since then he is been having progressively worsening peripheral edema which is his complaint today.  Edema involves lower extremity, abdomen and upper extremity.  He is also endorsing associated dyspnea on exertion and with any activity.  He denies any chest pain.  No nausea, vomiting, abdominal pain.  Patient started taking his Lasix 1 week ago without significant improvement.  Patient does not have any oxygen at home.    HPI  Past Medical History Past Medical History:  Diagnosis Date  . Atrial flutter with rapid ventricular response ([place]) 08/06/2015   a. s/p DCCV in 07/2015 with recurrent atrial fibrillation and DCCV in 12/2016. Initially successful but noted to be back in atrial fibrillation within 2 weeks of DCCV.   [name] Chronic diastolic (congestive) heart failure (HCC)    a. EF 50-55% by echo in 06/2016.  . Diabetes ([place])   . Hypertension    Patient Active Problem List   Diagnosis Date Noted  . Excessive daytime sleepiness 04/02/2017  . Other fatigue 04/02/2017  . Bradycardia 03/28/2017  . CKD (chronic kidney disease) stage 4, GFR 15-29 ml/min (HCC) 03/28/2017  . Pseudogout 03/09/2017  . Rheumatoid arthritis ([place]) 03/09/2017  . Infected ulcer of skin, with fat layer exposed ([place]) 03/09/2017  . Normocytic anemia 03/06/2017  . Elevated troponin 03/06/2017  . Acute renal failure superimposed on stage 4 chronic kidney disease ([place]) 03/06/2017  . Essential hypertension 12/29/2016  . Persistent atrial fibrillation ([place])   .  Left leg cellulitis   . Atrial fibrillation with RVR ([place]) 12/02/2016  . Sepsis ([place]) 12/02/2016  . Acute on chronic congestive heart failure ([place])   . Acute on chronic renal insufficiency   . Chronic diastolic CHF (congestive heart failure) ([place]) 03/20/2016  . Nonischemic cardiomyopathy ([place]) 11/11/2015  . Long term (current) use of anticoagulants [Z79.01] 08/14/2015  . Insulin-dependent diabetes mellitus with renal complications ([place]) 84/16/6063  . Atrial flutter with rapid ventricular response ([place]) 08/06/2015  . Hyponatremia 07/30/2015  . AKI (acute kidney injury) ([place]) 07/30/2015  . Hyperkalemia 07/30/2015  . Acute on chronic combined systolic and diastolic CHF (congestive heart failure) ([place]) 07/30/2015  . Morbid obesity ([place]) 07/30/2015  . Paroxysmal atrial fibrillation ([place]) 07/30/2015  . 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 Hypertensive heart disease with heart failure ([place]) 07/14/2012  . Dyspnea 07/14/2012  . Hyperlipidemia 07/14/2012   Home Medication(s) Prior to Admission medications   Medication Sig Start Date End Date Taking? Authorizing Provider  albuterol (PROVENTIL HFA;VENTOLIN HFA) 108 (90 BASE) MCG/ACT inhaler Inhale 1-2 puffs into the lungs every 6 (six) hours as needed for wheezing. 05/30/14   Nahser, Wonda Cheng, MD  carvedilol (COREG) 12.5 MG tablet Take 1 tablet (12.5 mg total) by mouth 2 (two) times daily with a meal. 03/31/17   Allie Bossier, MD  celecoxib (CELEBREX) 100 MG capsule Take 100 mg by mouth daily. 03/04/17   [provider]  colchicine 0.6 MG tablet Take 0.5 tablets (0.3 mg total) by mouth daily. 12/16/16   Barton Dubois, MD  diltiazem [facility] CD)  180 MG 24 hr capsule Take 1 capsule (180 mg total) by mouth daily. 04/01/17   Allie Bossier, MD  fluticasone (FLONASE) 50 MCG/ACT nasal spray Place 2 sprays into the nose daily.  12/29/12   [provider]  furosemide (LASIX) 40 MG tablet Take 40 mg  by mouth 2 (two) times daily. 03/24/17   [provider]  gabapentin (NEURONTIN) 300 MG capsule Take 1 capsule (300 mg total) by mouth 2 (two) times daily. 03/09/17   Debbe Odea, MD  insulin aspart (NOVOLOG FLEXPEN) 100 UNIT/ML FlexPen Inject 0-12 Units into the skin 3 (three) times daily as needed for high blood sugar. Sliding Scale <70, initiate hypoglycemia protocol, 70-100=0 units, 110-199= 3 units, 200-250=5 units, 251-300=7 units, 301-350=10 units, 351-400=12 units, >400=14 units. Call MD if >450 03/09/17   Debbe Odea, MD  Insulin Glargine (LANTUS) 100 UNIT/ML Solostar Pen Inject 25 Units into the skin daily at 10 pm. 03/09/17   Debbe Odea, MD  Insulin Pen Needle 31G X 6 MM MISC 1 Device by Does not apply route 2 (two) times daily. 08/08/15   Charlynne Cousins, MD  potassium chloride SA (KLOR-CON M20) 20 MEQ tablet Take 1 tablet (20 mEq total) by mouth daily. 08/19/15   Hilty, Nadean Corwin, MD  rivaroxaban (XARELTO) 20 MG TABS tablet take 1 tablet by mouth once daily WITH SUPPER Patient taking differently: Take 20 mg by mouth at bedtime.  10/12/16   Pixie Casino, MD                                                                                                                                    Past Surgical History Past Surgical History:  Procedure Laterality Date  . CARDIOVERSION N/A 08/01/2015   Procedure: CARDIOVERSION;  Surgeon: Josue Hector, MD;  Location: [facility] ENDOSCOPY;  Service: Cardiovascular;  Laterality: N/A;  . CARDIOVERSION N/A 12/18/2016   Procedure: CARDIOVERSION;  Surgeon: [name], MD;  Location: [place];  Service: Cardiovascular;  Laterality: N/A;  . CHOLECYSTECTOMY    . TEE WITHOUT CARDIOVERSION N/A 08/01/2015   Procedure: TRANSESOPHAGEAL ECHOCARDIOGRAM (TEE);  Surgeon: Josue Hector, MD;  [facility] ENDOSCOPY;  Service: Cardiovascular;  Laterality: N/A;   Family History Family History  Problem Relation Age of Onset  . Heart disease  Mother   . Hyperlipidemia Father   . Hypertension Father     Social History Social History   Tobacco Use  . Smoking status: Never Smoker  . Smokeless tobacco: Never Used  Substance Use Topics  . Alcohol use: No  . Drug use: No   Allergies Patient has no known allergies.  Review of Systems Review of Systems All other systems are reviewed and are negative for acute change except as noted in the HPI  Physical Exam Vital Signs  I have reviewed the triage vital signs BP (!) 145/101   Pulse (!) 108   Resp 20  SpO2 98%   Physical Exam  Constitutional: He is oriented to person, place, and time. He appears well-developed and well-nourished. No distress.  HENT:  Head: Normocephalic and atraumatic.  Nose: Nose normal.  Eyes: Conjunctivae and EOM are normal. Pupils are equal, round, and reactive to light. Right eye exhibits no discharge. Left eye exhibits no discharge. No scleral icterus.  Neck: Normal range of motion. Neck supple.  Cardiovascular: An irregularly irregular rhythm present. Tachycardia present. Exam reveals no gallop and no friction rub.  No murmur heard. Pulmonary/Chest: Effort normal and breath sounds normal. No stridor. No respiratory distress. He has no rales.  Abdominal: Soft. He exhibits no distension. There is no tenderness.  Musculoskeletal: He exhibits no edema or tenderness.  2+ peripheral edema from feet up to abdomen.  Patient also has bilateral upper extremity 1+ pitting edema.  Neurological: He is alert and oriented to person, place, and time.  Skin: Skin is warm and dry. No rash noted. He is not diaphoretic. No erythema.  Psychiatric: He has a normal mood and affect.  Vitals reviewed.   ED Results and Treatments Labs (all labs ordered are listed, but only abnormal results are displayed) Labs Reviewed  CBC WITH DIFFERENTIAL/PLATELET - Abnormal; Notable for the following components:      Result Value   RBC 3.54 (*)    Hemoglobin 10.1 (*)     HCT 32.4 (*)    RDW 18.2 (*)    Monocytes Absolute 1.1 (*)    All other components within normal limits  BRAIN NATRIURETIC PEPTIDE - Abnormal; Notable for the following components:   B Natriuretic Peptide 531.3 (*)    All other components within normal limits  COMPREHENSIVE METABOLIC PANEL - Abnormal; Notable for the following components:   BUN 51 (*)    Creatinine, Ser 2.65 (*)    Calcium 8.5 (*)    Total Protein 5.8 (*)    Albumin 2.8 (*)    AST 12 (*)    ALT 10 (*)    GFR calc non Af Amer 28 (*)    GFR calc Af Amer 32 (*)    All other components within normal limits  I-STAT TROPONIN, ED                                                                                                                         EKG  EKG Interpretation  Date/Time:  Thursday April 22 2017 16:51:42 EST Ventricular Rate:  91 PR Interval:    QRS Duration: 132 QT Interval:  384 QTC Calculation: 473 R Axis:   -69 Text Interpretation:  Atrial fibrillation Right bundle branch block Consider inferior infarct NO STEMI Otherwise no significant change Confirmed by Addison Lank [phone]) on 04/22/2017 5:52:49 PM      Radiology Dg Chest 2 View  Result Date: 04/22/2017 CLINICAL DATA:  Whole body edema.  Minor shortness of breath. EXAM: CHEST  2 VIEW COMPARISON:  03/06/2017 FINDINGS: Enlarged cardiac silhouette.  Mediastinal contours  appear intact. There is no evidence of focal airspace consolidation, pleural effusion or pneumothorax. Mild interstitial pulmonary edema. Osseous structures are without acute abnormality. Soft tissues are grossly normal. IMPRESSION: Enlarged cardiac silhouette which may be due to heart failure or pericardial effusion. Interstitial pulmonary edema. Electronically Signed   By: Fidela Salisbury M.D.   On: 04/22/2017 19:07   Pertinent labs & imaging results that were available during my care of the patient were reviewed by me and considered in my medical decision making (see chart for  details).  Medications Ordered in ED Medications  furosemide (LASIX) injection 80 mg (80 mg Intravenous Given 04/22/17 1904)                                                                                                                                    Procedures Procedures  (including critical care time)  Medical Decision Making / ED Course I have reviewed the nursing notes for this encounter and the patient's prior records (if available in EHR or on provided paperwork).    Patient with significant volume overload on exam.  Workup concerning for CHF exacerbation.  Chest x-ray with cardiomegaly at baseline and interstitial pulmonary edema.  BNP greater than 500.  Renal function at patient's baseline.  Troponin negative.  EKG without acute ischemic changes or evidence of pericarditis.  Patient provided with 80 mg of IV Lasix and reevaluated several hours later after large volume urine output.  Patient continued to be symptomatic and desatted on room air.  He was admitted for further management.  Final Clinical Impression(s) / ED Diagnoses Final diagnoses:  SOB (shortness of breath)  Acute on chronic diastolic congestive heart failure ([place])      This chart was dictated using voice recognition software.  Despite best efforts to proofread,  errors can occur which can change the documentation meaning.   Fatima Blank, MD 04/22/17 2340

## 2017-04-22 NOTE — ED Triage Notes (Addendum)
Per [facility]: Pt has stage 3 kidney failure. Pt has generalized edema from head to toe. Pts skin is taught. Pts SPo2 would drop (to 93%-94%) in the doctors office today with simple movement, standing up and walking. About 2 weeks ago and PCP put him on prednisone, states that this made him retain fluid, was told to stop taking his lasix for a week, pt noted that he was starting to retain a lot of fluid and started himself back on his lasix. At rest 95% on room air, diminished in lower lung fields, was able to help move from bed to chair. Denies shortness of breath, just states uncomfortable from the swelling.   Pt states that he would estimate that he ha gained about 10-15 lbs in about 2 weeks. Pts skin is taught and he has pitting edema in is arms, hands, legs, and feet. Pts abdomen is swollen and he reports swelling to his groin as well. Pt states that he has some difficulty walking due to all of the swelling.

## 2017-04-22 NOTE — ED Notes (Signed)
Ambulated pt in hall, he was noticeably SOB, SPO2 on RA went from 94 to 89% while ambulating

## 2017-04-22 NOTE — ED Notes (Signed)
Dr. Cardama at bedside.  

## 2017-04-22 NOTE — ED Notes (Signed)
EMERGENCY DEPARTMENT  US GUIDANCE EXAM Emergency Ultrasound:  US Guidance for Needle Guidance  INDICATIONS: Difficult vascular access Linear probe used in real-time to visualize location of needle entry through skin.   PERFORMED BY: Myself IMAGES ARCHIVED?: No LIMITATIONS: None VIEWS USED: Transverse INTERPRETATION: Needle visualized within vein   Tolerated well w/no complications.   Aaron Edelman RN  5:45 PM 04/22/17

## 2017-04-22 NOTE — H&P (Addendum)
PCP:   [name], MD   Chief Complaint:  Swelling  HPI: This is a 47 year old gentleman who presents with complaints of weight gain, increasing edema and difficulty walking as a result.  This is a patient with rheumatoid arthritis, diabetes mellitus, chronic fluid overload.  He was started on prednisone approximately 9 weeks ago to treat his rheumatoid arthritis.  At that time his Lasix was continued.  He was maintained on prednisone for 6 weeks.  His Lasix was restarted approximately 3 weeks ago.  With his Lasix being held he has gained weight, he states approximately 15 pounds.  Despite being restarted on his Lasix he is not lost any weight to the point now it has become difficult for him to walk.  He saw his PCP today and was sent him to the ER.  The patient denies any cough, wheeze or chest pains.  He does report increased shortness of breath with activity.  History provided by the patient.  The ER the patient is also hypoxic with ambulation, his oxygen sats going down to 80% with ambulation.  History provided by the patient was alert and oriented  Review of Systems:  The patient denies anorexia, fever, weight gain,, vision loss, decreased hearing, hoarseness, chest pain, syncope, dyspnea on exertion, peripheral edema, balance deficits, hemoptysis, abdominal pain, melena, hematochezia, severe indigestion/heartburn, hematuria, incontinence, genital sores, muscle weakness, suspicious skin lesions, transient blindness, difficulty walking, depression, unusual weight change, abnormal bleeding, enlarged lymph nodes, angioedema, and breast masses.  Past Medical History: Past Medical History:  Diagnosis Date  . Atrial flutter with rapid ventricular response ([place]) 08/06/2015   a. s/p DCCV in 07/2015 with recurrent atrial fibrillation and DCCV in 12/2016. Initially successful but noted to be back in atrial fibrillation within 2 weeks of DCCV.   [name] Chronic diastolic (congestive) heart failure  (HCC)    a. EF 50-55% by echo in 06/2016.  . Diabetes ([place])   . Hypertension    Past Surgical History:  Procedure Laterality Date  . CARDIOVERSION N/A 08/01/2015   Procedure: CARDIOVERSION;  Surgeon: Josue Hector, MD;  Location: [facility] ENDOSCOPY;  Service: Cardiovascular;  Laterality: N/A;  . CARDIOVERSION N/A 12/18/2016   Procedure: CARDIOVERSION;  Surgeon: [name], MD;  Location: [place];  Service: Cardiovascular;  Laterality: N/A;  . CHOLECYSTECTOMY    . TEE WITHOUT CARDIOVERSION N/A 08/01/2015   Procedure: TRANSESOPHAGEAL ECHOCARDIOGRAM (TEE);  Surgeon: Josue Hector, MD;  Location: [facility] ENDOSCOPY;  Service: Cardiovascular;  Laterality: N/A;    Medications: Prior to Admission medications   Medication Sig Start Date End Date Taking? Authorizing Provider  albuterol (PROVENTIL HFA;VENTOLIN HFA) 108 (90 BASE) MCG/ACT inhaler Inhale 1-2 puffs into the lungs every 6 (six) hours as needed for wheezing. 05/30/14   Nahser, Wonda Cheng, MD  carvedilol (COREG) 12.5 MG tablet Take 1 tablet (12.5 mg total) by mouth 2 (two) times daily with a meal. 03/31/17   Allie Bossier, MD  celecoxib (CELEBREX) 100 MG capsule Take 100 mg by mouth daily. 03/04/17   [provider]  colchicine 0.6 MG tablet Take 0.5 tablets (0.3 mg total) by mouth daily. 12/16/16   Barton Dubois, MD  diltiazem (CARDIZEM CD) 180 MG 24 hr capsule Take 1 capsule (180 mg total) by mouth daily. 04/01/17   Allie Bossier, MD  fluticasone (FLONASE) 50 MCG/ACT nasal spray Place 2 sprays into the nose daily.  12/29/12   [provider]  furosemide (LASIX) 40 MG tablet Take 40 mg by  mouth 2 (two) times daily. 03/24/17   [provider]  gabapentin (NEURONTIN) 300 MG capsule Take 1 capsule (300 mg total) by mouth 2 (two) times daily. 03/09/17   Debbe Odea, MD  insulin aspart (NOVOLOG FLEXPEN) 100 UNIT/ML FlexPen Inject 0-12 Units into the skin 3 (three) times daily as needed for high blood sugar. Sliding  Scale <70, initiate hypoglycemia protocol, 70-100=0 units, 110-199= 3 units, 200-250=5 units, 251-300=7 units, 301-350=10 units, 351-400=12 units, >400=14 units. Call MD if >450 03/09/17   Debbe Odea, MD  Insulin Glargine (LANTUS) 100 UNIT/ML Solostar Pen Inject 25 Units into the skin daily at 10 pm. 03/09/17   Debbe Odea, MD  Insulin Pen Needle 31G X 6 MM MISC 1 Device by Does not apply route 2 (two) times daily. 08/08/15   Charlynne Cousins, MD  potassium chloride SA (KLOR-CON M20) 20 MEQ tablet Take 1 tablet (20 mEq total) by mouth daily. 08/19/15   Hilty, Nadean Corwin, MD  rivaroxaban (XARELTO) 20 MG TABS tablet take 1 tablet by mouth once daily WITH SUPPER Patient taking differently: Take 20 mg by mouth at bedtime.  10/12/16   Pixie Casino, MD    Allergies:  No Known Allergies  Social History:  reports that  has never smoked. he has never used smokeless tobacco. He reports that he does not drink alcohol or use drugs.  Family History: Family History  Problem Relation Age of Onset  . Heart disease Mother   . Hyperlipidemia Father   . Hypertension Father     Physical Exam: Vitals:   04/22/17 2045 04/22/17 2115 04/22/17 2130 04/22/17 2145  BP: (!) 135/94 (!) 129/106 126/90 (!) 145/101  Pulse: (!) 106 (!) 110 78 (!) 108  Resp: 18 (!) 21 (!) 22 20  SpO2: 100% 99% 95% 98%    General:  Alert and oriented times three, well developed and nourished, no acute distress Eyes: PERRLA, pink conjunctiva, no scleral icterus ENT: Moist oral mucosa, neck supple, no thyromegaly Lungs: clear to ascultation, no wheeze, no crackles, no use of accessory muscles Cardiovascular: regular rate and rhythm, no regurgitation, no gallops, no murmurs. No carotid bruits, no JVD Abdomen: soft, positive BS, non-tender, non-distended, no organomegaly, not an acute abdomen GU: not examined Neuro: CN II - XII grossly intact, sensation intact Musculoskeletal: strength 5/5 all extremities, no clubbing ,>3+  edema B/l LE into torso, some difuse UE edema. Groin edema Skin: no rash, no subcutaneous crepitation, no decubitus Psych: appropriate patient   Labs on Admission:  Recent Labs    04/22/17 1733  NA 140  K 4.0  CL 107  CO2 22  GLUCOSE 97  BUN 51*  CREATININE 2.65*  CALCIUM 8.5*   Recent Labs    04/22/17 1733  AST 12*  ALT 10*  ALKPHOS 101  BILITOT 0.6  PROT 5.8*  ALBUMIN 2.8*   No results for input(s): LIPASE, AMYLASE in the last 72 hours. Recent Labs    04/22/17 1733  WBC 8.8  NEUTROABS 5.7  HGB 10.1*  HCT 32.4*  MCV 91.5  PLT 344   No results for input(s): CKTOTAL, CKMB, CKMBINDEX, TROPONINI in the last 72 hours. Invalid input(s): POCBNP No results for input(s): DDIMER in the last 72 hours. No results for input(s): HGBA1C in the last 72 hours. No results for input(s): CHOL, HDL, LDLCALC, TRIG, CHOLHDL, LDLDIRECT in the last 72 hours. No results for input(s): TSH, T4TOTAL, T3FREE, THYROIDAB in the last 72 hours.  Invalid input(s): FREET3 No  results for input(s): VITAMINB12, FOLATE, FERRITIN, TIBC, IRON, RETICCTPCT in the last 72 hours.  Micro Results: No results found for this or any previous visit (from the past 240 hour(s)).   Radiological Exams on Admission: Dg Chest 2 View  Result Date: 04/22/2017 CLINICAL DATA:  Whole body edema.  Minor shortness of breath. EXAM: CHEST  2 VIEW COMPARISON:  03/06/2017 FINDINGS: Enlarged cardiac silhouette.  Mediastinal contours appear intact. There is no evidence of focal airspace consolidation, pleural effusion or pneumothorax. Mild interstitial pulmonary edema. Osseous structures are without acute abnormality. Soft tissues are grossly normal. IMPRESSION: Enlarged cardiac silhouette which may be due to heart failure or pericardial effusion. Interstitial pulmonary edema. Electronically Signed   By: Fidela Salisbury M.D.   On: 04/22/2017 19:07    Assessment/Plan Present on Admission: . Acute on chronic combined  systolic and diastolic CHF (congestive heart failure) ([place]) -Admit to med telemetry -IV Lasix twice daily, daily weights, strict I's and O's -CHF order set initiated for his fluid overload  . stage 4 chronic kidney disease (HCC) -Stable, at baseline -Home meds resumed  . Hyperlipidemia -Stable, home meds resumed  . Rheumatoid arthritis ([place]) -Stable,   Diabetes mellitus  -Home meds resume, sliding scale insulin  . Atrial fibrillation -Stable, home meds resumed  Mekaylah Klich 04/22/2017, 10:41 PM

## 2017-04-22 NOTE — ED Notes (Signed)
Pt returned from xray

## 2017-04-22 NOTE — ED Notes (Signed)
Patient transported to X-ray 

## 2017-04-23 ENCOUNTER — Other Ambulatory Visit: Payer: Self-pay

## 2017-04-23 DIAGNOSIS — I5023 Acute on chronic systolic (congestive) heart failure: Secondary | ICD-10-CM

## 2017-04-23 DIAGNOSIS — R748 Abnormal levels of other serum enzymes: Secondary | ICD-10-CM

## 2017-04-23 LAB — CBC WITH DIFFERENTIAL/PLATELET
BASOS ABS: 0.1 10*3/uL (ref 0.0–0.1)
BASOS PCT: 1 %
EOS ABS: 0.6 10*3/uL (ref 0.0–0.7)
Eosinophils Relative: 7 %
HCT: 32.8 % — ABNORMAL LOW (ref 39.0–52.0)
Hemoglobin: 10.1 g/dL — ABNORMAL LOW (ref 13.0–17.0)
Lymphocytes Relative: 17 %
Lymphs Abs: 1.4 10*3/uL (ref 0.7–4.0)
MCH: 28.1 pg (ref 26.0–34.0)
MCHC: 30.8 g/dL (ref 30.0–36.0)
MCV: 91.1 fL (ref 78.0–100.0)
MONO ABS: 0.9 10*3/uL (ref 0.1–1.0)
MONOS PCT: 10 %
NEUTROS ABS: 5.5 10*3/uL (ref 1.7–7.7)
NEUTROS PCT: 65 %
Platelets: 364 10*3/uL (ref 150–400)
RBC: 3.6 MIL/uL — ABNORMAL LOW (ref 4.22–5.81)
RDW: 17.9 % — AB (ref 11.5–15.5)
WBC: 8.4 10*3/uL (ref 4.0–10.5)

## 2017-04-23 LAB — BRAIN NATRIURETIC PEPTIDE: B NATRIURETIC PEPTIDE 5: 627.9 pg/mL — AB (ref 0.0–100.0)

## 2017-04-23 LAB — BASIC METABOLIC PANEL
Anion gap: 12 (ref 5–15)
BUN: 48 mg/dL — AB (ref 6–20)
CO2: 21 mmol/L — ABNORMAL LOW (ref 22–32)
CREATININE: 2.33 mg/dL — AB (ref 0.61–1.24)
Calcium: 8.6 mg/dL — ABNORMAL LOW (ref 8.9–10.3)
Chloride: 107 mmol/L (ref 101–111)
GFR, EST AFRICAN AMERICAN: 37 mL/min — AB (ref >60)
GFR, EST NON AFRICAN AMERICAN: 32 mL/min — AB (ref >60)
Glucose, Bld: 123 mg/dL — ABNORMAL HIGH (ref 65–99)
Potassium: 4 mmol/L (ref 3.5–5.1)
SODIUM: 140 mmol/L (ref 135–145)

## 2017-04-23 LAB — GLUCOSE, CAPILLARY
GLUCOSE-CAPILLARY: 159 mg/dL — AB (ref 65–99)
Glucose-Capillary: 125 mg/dL — ABNORMAL HIGH (ref 65–99)

## 2017-04-23 LAB — CBG MONITORING, ED
GLUCOSE-CAPILLARY: 96 mg/dL (ref 65–99)
Glucose-Capillary: 130 mg/dL — ABNORMAL HIGH (ref 65–99)

## 2017-04-23 MED ORDER — INSULIN GLARGINE 100 UNIT/ML SOLOSTAR PEN: 25.0000 [IU] | PEN_INJECTOR | Freq: Every day | SUBCUTANEOUS | Status: DC

## 2017-04-23 MED ORDER — FLUTICASONE PROPIONATE 50 MCG/ACT NA SUSP
2.0000 | Freq: Every day | NASAL | Status: DC
  Administered 2017-04-23 – 2017-05-13 (×7): 2 via NASAL
  Filled 2017-04-23 (×2): qty 16

## 2017-04-23 MED ORDER — CARVEDILOL 12.5 MG PO TABS
12.5000 mg | ORAL_TABLET | Freq: Two times a day (BID) | ORAL | Status: DC
  Administered 2017-04-23 – 2017-04-28 (×11): 12.5 mg via ORAL
  Filled 2017-04-23 (×11): qty 1

## 2017-04-23 MED ORDER — DILTIAZEM HCL ER COATED BEADS 180 MG PO CP24
180.0000 mg | ORAL_CAPSULE | Freq: Every day | ORAL | Status: DC
  Administered 2017-04-23 – 2017-04-24 (×2): 180 mg via ORAL
  Filled 2017-04-23 (×2): qty 1

## 2017-04-23 MED ORDER — FUROSEMIDE 10 MG/ML IJ SOLN
40.0000 mg | Freq: Two times a day (BID) | INTRAMUSCULAR | Status: DC
  Administered 2017-04-23 – 2017-04-24 (×3): 40 mg via INTRAVENOUS
  Filled 2017-04-23 (×4): qty 4

## 2017-04-23 MED ORDER — INSULIN GLARGINE 100 UNIT/ML ~~LOC~~ SOLN
25.0000 [IU] | Freq: Every day | SUBCUTANEOUS | Status: DC
  Administered 2017-04-23 – 2017-05-12 (×20): 25 [IU] via SUBCUTANEOUS
  Filled 2017-04-23 (×23): qty 0.25

## 2017-04-23 MED ORDER — COLCHICINE 0.6 MG PO TABS
0.3000 mg | ORAL_TABLET | Freq: Every day | ORAL | Status: DC
  Administered 2017-04-23 – 2017-05-07 (×12): 0.3 mg via ORAL
  Filled 2017-04-23 (×17): qty 0.5

## 2017-04-23 MED ORDER — RIVAROXABAN 20 MG PO TABS
20.0000 mg | ORAL_TABLET | Freq: Every day | ORAL | Status: DC
  Administered 2017-04-23 – 2017-04-26 (×4): 20 mg via ORAL
  Filled 2017-04-23 (×4): qty 1

## 2017-04-23 MED ORDER — INSULIN ASPART 100 UNIT/ML ~~LOC~~ SOLN
0.0000 [IU] | Freq: Three times a day (TID) | SUBCUTANEOUS | Status: DC
  Administered 2017-04-23: 2 [IU] via SUBCUTANEOUS
  Administered 2017-04-23: 3 [IU] via SUBCUTANEOUS
  Administered 2017-04-24 – 2017-04-27 (×7): 2 [IU] via SUBCUTANEOUS
  Administered 2017-04-30: 3 [IU] via SUBCUTANEOUS
  Administered 2017-05-01 (×2): 2 [IU] via SUBCUTANEOUS
  Administered 2017-05-02: 8 [IU] via SUBCUTANEOUS
  Administered 2017-05-02: 2 [IU] via SUBCUTANEOUS
  Administered 2017-05-02: 3 [IU] via SUBCUTANEOUS
  Administered 2017-05-03: 5 [IU] via SUBCUTANEOUS
  Administered 2017-05-04: 3 [IU] via SUBCUTANEOUS
  Administered 2017-05-05: 2 [IU] via SUBCUTANEOUS
  Administered 2017-05-07: 5 [IU] via SUBCUTANEOUS
  Administered 2017-05-07 – 2017-05-08 (×3): 3 [IU] via SUBCUTANEOUS
  Administered 2017-05-09: 2 [IU] via SUBCUTANEOUS
  Administered 2017-05-09: 3 [IU] via SUBCUTANEOUS
  Administered 2017-05-09 – 2017-05-10 (×2): 5 [IU] via SUBCUTANEOUS
  Administered 2017-05-10: 3 [IU] via SUBCUTANEOUS
  Administered 2017-05-11: 5 [IU] via SUBCUTANEOUS
  Administered 2017-05-11: 3 [IU] via SUBCUTANEOUS
  Administered 2017-05-12: 4 [IU] via SUBCUTANEOUS
  Administered 2017-05-12: 8 [IU] via SUBCUTANEOUS
  Administered 2017-05-13: 3 [IU] via SUBCUTANEOUS
  Filled 2017-04-23: qty 1

## 2017-04-23 MED ORDER — SODIUM CHLORIDE 0.9% FLUSH
3.0000 mL | Freq: Two times a day (BID) | INTRAVENOUS | Status: DC
  Administered 2017-04-24 – 2017-05-12 (×16): 3 mL via INTRAVENOUS

## 2017-04-23 MED ORDER — INSULIN ASPART 100 UNIT/ML ~~LOC~~ SOLN
0.0000 [IU] | Freq: Every day | SUBCUTANEOUS | Status: DC
  Administered 2017-05-02: 5 [IU] via SUBCUTANEOUS
  Administered 2017-05-05: 2 [IU] via SUBCUTANEOUS
  Administered 2017-05-07: 3 [IU] via SUBCUTANEOUS
  Administered 2017-05-09 – 2017-05-10 (×2): 2 [IU] via SUBCUTANEOUS

## 2017-04-23 MED ORDER — GABAPENTIN 300 MG PO CAPS
300.0000 mg | ORAL_CAPSULE | Freq: Two times a day (BID) | ORAL | Status: DC
  Administered 2017-04-23 – 2017-05-03 (×19): 300 mg via ORAL
  Filled 2017-04-23: qty 3
  Filled 2017-04-23 (×19): qty 1
  Filled 2017-04-23: qty 3

## 2017-04-23 MED ORDER — SODIUM CHLORIDE 0.9 % IV SOLN
250.0000 mL | INTRAVENOUS | Status: DC | PRN
  Administered 2017-04-30: 13:00:00 via INTRAVENOUS

## 2017-04-23 MED ORDER — ACETAMINOPHEN 325 MG PO TABS
650.0000 mg | ORAL_TABLET | ORAL | Status: DC | PRN
  Administered 2017-04-23 – 2017-05-11 (×8): 650 mg via ORAL
  Filled 2017-04-23 (×9): qty 2

## 2017-04-23 MED ORDER — SODIUM CHLORIDE 0.9% FLUSH: 3.0000 mL | INTRAVENOUS | Status: DC | PRN

## 2017-04-23 MED ORDER — ONDANSETRON HCL 4 MG/2ML IJ SOLN
4.0000 mg | Freq: Four times a day (QID) | INTRAMUSCULAR | Status: DC | PRN
  Administered 2017-05-04: 4 mg via INTRAVENOUS
  Filled 2017-04-23: qty 2

## 2017-04-23 NOTE — Clinical Social Work Note (Signed)
CSW acknowledges consult regarding medication assistance. Please consult RNCM for this need.  CSW signing off. Consult again if any social work needs arise.  Dayton Scrape, [place]

## 2017-04-23 NOTE — ED Notes (Signed)
Lunch order sent

## 2017-04-23 NOTE — ED Notes (Signed)
Admit  Dr into see pt, pt moved to  Reg bed. Pt stood to move to bed

## 2017-04-23 NOTE — Plan of Care (Signed)
  Progressing Education: Knowledge of General Education information will improve 04/23/2017 2301 - Progressing by Theador Hawthorne, RN Nutrition: Adequate nutrition will be maintained 04/23/2017 2301 - Progressing by Theador Hawthorne, RN Pain Managment: General experience of comfort will improve 04/23/2017 2301 - Progressing by Theador Hawthorne, RN Safety: Ability to remain free from injury will improve 04/23/2017 2301 - Progressing by Theador Hawthorne, RN Skin Integrity: Risk for impaired skin integrity will decrease 04/23/2017 2301 - Progressing by Theador Hawthorne, RN

## 2017-04-23 NOTE — ED Notes (Signed)
Searched for vein to obtain blood draw, unable to find one

## 2017-04-23 NOTE — ED Notes (Signed)
Lunch tray delivered.

## 2017-04-23 NOTE — Progress Notes (Signed)
Patient has wound on left lower leg present on admission. Patient refuses for nurse to unwrap and assess. States he has wound care nurse that does changes at home and wound was dressed 02/07 by home health RN. Dressing clean dry and intact. Will continue to monitor patient.

## 2017-04-23 NOTE — ED Notes (Signed)
Pt states that he has increasing   Swelling in bhody , arms and legs are tight and swollen even his testicles are swollen, pt is aaox3 , states that dr was to order a sleep study for  Possible cpap at night because he has been getting  soo sob and  And has has so much edema in lower extremities

## 2017-04-23 NOTE — Progress Notes (Signed)
PROGRESS NOTE    John Bailey  JQB:[id number] DOB: [birth year] DOA: 04/22/2017 PCP: [name], MD   Brief Narrative: John Bailey is a 47 y.o. rheumatoid arthritis, diabetes mellitus, heart failure, atrial fibrillation. Patient presented with dyspnea and found to have a CHF exacerbation.   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 Currently on room air. BNP of 628. Per patient, dry weight is around 260 lbs. Per chart review, he was 341 lbs three weeks ago and 304 lbs just over one month ago. UOP of 800 mL over last 12 hours -Continue Lasix 40 mg IV BID; titrate based on UOP -Echocardiogram -Daily weight/strict in and out  Anasarca Secondary to fluid overload.  Rheumatoid arthritis Patient on Abatacept every Tuesday.  Atrial fibrillation with RVR Rate elevated today but patient did not receive diltiazem early. With heart failure, question role of diltiazem. Per last cardiology note, he was in sinus rhythm -Continue Cardizem -If persists, will consult cardiology  CKD Stage III   DVT prophylaxis: Xarelto Code Status: Full code Family Communication: None at bedside Disposition Plan: Likely home when medically stable   Consultants:   None  Procedures:   None  Antimicrobials:  None    Subjective: Patient reports improvement of dyspnea.  Objective: Vitals:   04/23/17 1145 04/23/17 1200 04/23/17 1215 04/23/17 1230  BP: (!) 127/99 (!)  121/101 (!) 106/58 (!) 119/101  Pulse:  (!) 29 67 74  Resp: 13 (!) 22 14 16   SpO2:  94% 100% 97%  Weight:      Height:        Intake/Output Summary (Last 24 hours) at 04/23/2017 1332 Last data filed at 04/23/2017 1246 Gross per 24 hour  Intake -  Output 1000 ml  Net -1000 ml   Filed Weights   04/23/17 0800  Weight: (!) 160.8 kg (354 lb 8 oz)    Examination:  General exam: Appears calm and comfortable Respiratory system: Clear to auscultation. Respiratory effort normal. Cardiovascular system: S1 & S2 heard, irregular rhythm with fast heart rate. No murmurs, rubs, gallops or clicks. 2+ edema of whole body with tight skin Gastrointestinal system: Abdomen is nondistended, soft and nontender. No organomegaly or masses felt. Normal bowel sounds heard. Central nervous system: Alert and oriented. No focal neurological deficits. Extremities: No calf tenderness Skin: No cyanosis. No rashes Psychiatry: Judgement and insight appear normal. Mood & affect appropriate.     Data Reviewed: I have personally reviewed following labs and imaging studies  CBC: Recent Labs  Lab 04/22/17 1733 04/23/17 0736  WBC 8.8 8.4  NEUTROABS 5.7 5.5  HGB 10.1* 10.1*  HCT 32.4* 32.8*  MCV 91.5 91.1  PLT 344 409   Basic Metabolic Panel: Recent Labs  Lab 04/22/17 1733  NA 140  K 4.0  CL 107  CO2 22  GLUCOSE 97  BUN 51*  CREATININE 2.65*  CALCIUM 8.5*   GFR: Estimated Creatinine Clearance: 55.8 mL/min (A) (by C-G formula based on SCr of 2.65 mg/dL (  H)). Liver Function Tests: Recent Labs  Lab 04/22/17 1733  AST 12*  ALT 10*  ALKPHOS 101  BILITOT 0.6  PROT 5.8*  ALBUMIN 2.8*   No results for input(s): LIPASE, AMYLASE in the last 168 hours. No results for input(s): AMMONIA in the last 168 hours. Coagulation Profile: No results for input(s): INR, PROTIME in the last 168 hours. Cardiac Enzymes: No results for input(s): CKTOTAL, CKMB, CKMBINDEX, TROPONINI in the last 168 hours. BNP  (last 3 results) No results for input(s): PROBNP in the last 8760 hours. HbA1C: No results for input(s): HGBA1C in the last 72 hours. CBG: Recent Labs  Lab 04/23/17 0722 04/23/17 1257  GLUCAP 130* 96   Lipid Profile: No results for input(s): CHOL, HDL, LDLCALC, TRIG, CHOLHDL, LDLDIRECT in the last 72 hours. Thyroid Function Tests: No results for input(s): TSH, T4TOTAL, FREET4, T3FREE, THYROIDAB in the last 72 hours. Anemia Panel: No results for input(s): VITAMINB12, FOLATE, FERRITIN, TIBC, IRON, RETICCTPCT in the last 72 hours. Sepsis Labs: No results for input(s): PROCALCITON, LATICACIDVEN in the last 168 hours.  No results found for this or any previous visit (from the past 240 hour(s)).       Radiology Studies: Dg Chest 2 View  Result Date: 04/22/2017 CLINICAL DATA:  Whole body edema.  Minor shortness of breath. EXAM: CHEST  2 VIEW COMPARISON:  03/06/2017 FINDINGS: Enlarged cardiac silhouette.  Mediastinal contours appear intact. There is no evidence of focal airspace consolidation, pleural effusion or pneumothorax. Mild interstitial pulmonary edema. Osseous structures are without acute abnormality. Soft tissues are grossly normal. IMPRESSION: Enlarged cardiac silhouette which may be due to heart failure or pericardial effusion. Interstitial pulmonary edema. Electronically Signed   By: Fidela Salisbury M.D.   On: 04/22/2017 19:07        Scheduled Meds: . carvedilol  12.5 mg Oral BID WC  . colchicine  0.3 mg Oral Daily  . diltiazem  180 mg Oral Daily  . fluticasone  2 spray Each Nare Daily  . furosemide  40 mg Intravenous Q12H  . gabapentin  300 mg Oral BID  . insulin aspart  0-15 Units Subcutaneous TID WC  . insulin aspart  0-5 Units Subcutaneous QHS  . insulin glargine  25 Units Subcutaneous QHS  . rivaroxaban  20 mg Oral QHS  . sodium chloride flush  3 mL Intravenous Q12H   Continuous Infusions: . sodium chloride       LOS: 1 day     Cordelia Poche,  MD [facility] 04/23/2017, 1:32 PM Pager: [phone]  If 7PM-7AM, please contact night-coverage [email or web address] Password [email or web address] 04/23/2017, 1:32 PM

## 2017-04-24 ENCOUNTER — Inpatient Hospital Stay (HOSPITAL_COMMUNITY): Payer: BLUE CROSS/BLUE SHIELD

## 2017-04-24 ENCOUNTER — Other Ambulatory Visit (HOSPITAL_COMMUNITY): Payer: BLUE CROSS/BLUE SHIELD

## 2017-04-24 DIAGNOSIS — I5043 Acute on chronic combined systolic (congestive) and diastolic (congestive) heart failure: Secondary | ICD-10-CM

## 2017-04-24 DIAGNOSIS — I482 Chronic atrial fibrillation: Secondary | ICD-10-CM

## 2017-04-24 LAB — GLUCOSE, CAPILLARY
GLUCOSE-CAPILLARY: 150 mg/dL — AB (ref 65–99)
GLUCOSE-CAPILLARY: 143 mg/dL — AB (ref 65–99)
Glucose-Capillary: 141 mg/dL — ABNORMAL HIGH (ref 65–99)
Glucose-Capillary: 139 mg/dL — ABNORMAL HIGH (ref 65–99)

## 2017-04-24 LAB — BASIC METABOLIC PANEL
ANION GAP: 11 (ref 5–15)
BUN: 47 mg/dL — ABNORMAL HIGH (ref 6–20)
CHLORIDE: 103 mmol/L (ref 101–111)
CO2: 23 mmol/L (ref 22–32)
Calcium: 8.7 mg/dL — ABNORMAL LOW (ref 8.9–10.3)
Creatinine, Ser: 2.33 mg/dL — ABNORMAL HIGH (ref 0.61–1.24)
GFR calc Af Amer: 37 mL/min — ABNORMAL LOW (ref >60)
GFR calc non Af Amer: 32 mL/min — ABNORMAL LOW (ref >60)
Glucose, Bld: 165 mg/dL — ABNORMAL HIGH (ref 65–99)
POTASSIUM: 4.6 mmol/L (ref 3.5–5.1)
SODIUM: 137 mmol/L (ref 135–145)

## 2017-04-24 MED ORDER — DEXTROSE 5 % IV SOLN
15.0000 mg/h | INTRAVENOUS | Status: DC
  Administered 2017-04-24: 12 mg/h via INTRAVENOUS
  Administered 2017-04-26: 15 mg/h via INTRAVENOUS
  Filled 2017-04-24 (×4): qty 25

## 2017-04-24 MED ORDER — AMIODARONE HCL IN DEXTROSE 360-4.14 MG/200ML-% IV SOLN
30.0000 mg/h | INTRAVENOUS | Status: DC
  Administered 2017-04-25 – 2017-04-28 (×9): 30 mg/h via INTRAVENOUS
  Filled 2017-04-24 (×8): qty 200

## 2017-04-24 MED ORDER — FUROSEMIDE 10 MG/ML IJ SOLN: 40.0000 mg | Freq: Once | INTRAMUSCULAR | Status: DC

## 2017-04-24 MED ORDER — FUROSEMIDE 10 MG/ML IJ SOLN: 80.0000 mg | Freq: Two times a day (BID) | INTRAMUSCULAR | Status: DC

## 2017-04-24 MED ORDER — AMIODARONE LOAD VIA INFUSION
150.0000 mg | Freq: Once | INTRAVENOUS | Status: AC
  Administered 2017-04-24: 150 mg via INTRAVENOUS
  Filled 2017-04-24: qty 83.34

## 2017-04-24 MED ORDER — FUROSEMIDE 10 MG/ML IJ SOLN
40.0000 mg | Freq: Once | INTRAMUSCULAR | Status: AC
  Administered 2017-04-24: 40 mg via INTRAVENOUS
  Filled 2017-04-24: qty 4

## 2017-04-24 MED ORDER — FUROSEMIDE 10 MG/ML IJ SOLN: 80.0000 mg | Freq: Once | INTRAMUSCULAR | Status: DC

## 2017-04-24 MED ORDER — METOLAZONE 2.5 MG PO TABS
2.5000 mg | ORAL_TABLET | Freq: Once | ORAL | Status: AC
  Administered 2017-04-24: 2.5 mg via ORAL
  Filled 2017-04-24: qty 1

## 2017-04-24 MED ORDER — AMIODARONE HCL IN DEXTROSE 360-4.14 MG/200ML-% IV SOLN
60.0000 mg/h | INTRAVENOUS | Status: AC
  Administered 2017-04-24 (×2): 60 mg/h via INTRAVENOUS
  Filled 2017-04-24 (×2): qty 200

## 2017-04-24 MED ORDER — FUROSEMIDE 10 MG/ML IJ SOLN
80.0000 mg | Freq: Once | INTRAMUSCULAR | Status: AC
  Administered 2017-04-24: 80 mg via INTRAVENOUS
  Filled 2017-04-24: qty 8

## 2017-04-24 NOTE — Plan of Care (Signed)
Physical therapy goals established.

## 2017-04-24 NOTE — Progress Notes (Signed)
PROGRESS NOTE    John Bailey  DXI:[id number] DOB: 26-Jan-1971 DOA: 04/22/2017 PCP: [name], MD   Brief Narrative: John Bailey is a 47 y.o. rheumatoid arthritis, diabetes mellitus, heart failure, atrial fibrillation. Patient presented with dyspnea and found to have a CHF exacerbation. Diuresing.   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 Currently on room air. BNP of 628. Per patient, dry weight is around 260 lbs. Per chart review, he was 341 lbs three weeks ago and 304 lbs just over one month ago. UOP of 1.15 L over last 24 hours. Weight stable. -Increase to Lasix 80 mg IV BID -Echocardiogram pending -Daily weight/strict in and out  Anasarca Secondary to fluid overload. -Diuresis as above  Rheumatoid arthritis Patient on Abatacept every Tuesday.  Atrial fibrillation with RVR Rate still elevated. Asymptomatic. May need to reconsider Xarelto in setting of CKD IV -Continue Cardizem -Consult cardiology  CKD Stage IV Stable.   DVT prophylaxis: Xarelto Code Status: Full code Family Communication: None at bedside Disposition Plan: Likely home when medically stable   Consultants:   Cardiology  Procedures:   None  Antimicrobials:  None    Subjective: No chest pain or dyspnea.  Objective: Vitals:   04/23/17 1937 04/24/17 0001 04/24/17 0526 04/24/17 1214  BP: (!) 132/108 128/82 (!) 146/115 132/89  Pulse: (!) 108 (!) 114 (!) 120    Resp: (!) 22 20 (!) 22 20  Temp: (!) 97.3 F (36.3 C) 97.8 F (36.6 C) 98.1 F (36.7 C) 99.1 F (37.3 C)  TempSrc: Oral Oral Oral Oral  SpO2: 95% 95% 96% 96%  Weight:   (!) 172.6 kg (380 lb 8 oz)   Height:        Intake/Output Summary (Last 24 hours) at 04/24/2017 1257 Last data filed at 04/24/2017 1200 Gross per 24 hour  Intake 720 ml  Output 1550 ml  Net -830 ml   Filed Weights   04/23/17 0800 04/23/17 1508 04/24/17 0526  Weight: (!) 160.8 kg (354 lb 8 oz) (!) 172 kg (379 lb 4.8 oz) (!) 172.6 kg (380 lb 8 oz)    Examination:  General exam: Appears calm and comfortable Respiratory system: Clear to auscultation but significantly diminished secondary to body habitus. Unlabored work of breathing.  Cardiovascular system: S1 & S2 heard, irregular rhythm, fast heart rate. No murmurs, rubs, gallops or clicks. 2+ edema of whole body with tight skin Gastrointestinal system: Abdomen is nondistended, soft and nontender. No organomegaly or masses felt. Normal bowel sounds heard. Central nervous system: Alert and oriented. No focal neurological deficits. Extremities: No calf tenderness. Skin: No cyanosis. No rashes Psychiatry: Judgement and insight appear normal. Mood & affect appropriate.     Data Reviewed: I have personally reviewed following labs and imaging studies  CBC: Recent Labs  Lab 04/22/17 1733 04/23/17 0736  WBC 8.8 8.4  NEUTROABS 5.7 5.5  HGB 10.1* 10.1*  HCT 32.4* 32.8*  MCV 91.5 91.1  PLT 344 767   Basic Metabolic  Panel: Recent Labs  Lab 04/22/17 1733 04/23/17 1335 04/24/17 0449  NA 140 140 137  K 4.0 4.0 4.6  CL 107 107 103  CO2 22 21* 23  GLUCOSE 97 123* 165*  BUN 51* 48* 47*  CREATININE 2.65* 2.33* 2.33*  CALCIUM 8.5* 8.6* 8.7*   GFR: Estimated Creatinine Clearance: 66.2 mL/min (A) (by C-G formula based on SCr of 2.33 mg/dL (H)). Liver Function Tests: Recent Labs  Lab 04/22/17 1733  AST 12*  ALT 10*  ALKPHOS 101  BILITOT 0.6  PROT 5.8*    ALBUMIN 2.8*   No results for input(s): LIPASE, AMYLASE in the last 168 hours. No results for input(s): AMMONIA in the last 168 hours. Coagulation Profile: No results for input(s): INR, PROTIME in the last 168 hours. Cardiac Enzymes: No results for input(s): CKTOTAL, CKMB, CKMBINDEX, TROPONINI in the last 168 hours. BNP (last 3 results) No results for input(s): PROBNP in the last 8760 hours. HbA1C: No results for input(s): HGBA1C in the last 72 hours. CBG: Recent Labs  Lab 04/23/17 1257 04/23/17 1625 04/23/17 2131 04/24/17 0738 04/24/17 1144  GLUCAP 96 159* 125* 150* 143*   Lipid Profile: No results for input(s): CHOL, HDL, LDLCALC, TRIG, CHOLHDL, LDLDIRECT in the last 72 hours. Thyroid Function Tests: No results for input(s): TSH, T4TOTAL, FREET4, T3FREE, THYROIDAB in the last 72 hours. Anemia Panel: No results for input(s): VITAMINB12, FOLATE, FERRITIN, TIBC, IRON, RETICCTPCT in the last 72 hours. Sepsis Labs: No results for input(s): PROCALCITON, LATICACIDVEN in the last 168 hours.  No results found for this or any previous visit (from the past 240 hour(s)).       Radiology Studies: Dg Chest 2 View  Result Date: 04/22/2017 CLINICAL DATA:  Whole body edema.  Minor shortness of breath. EXAM: CHEST  2 VIEW COMPARISON:  03/06/2017 FINDINGS: Enlarged cardiac silhouette.  Mediastinal contours appear intact. There is no evidence of focal airspace consolidation, pleural effusion or pneumothorax. Mild interstitial pulmonary edema. Osseous structures are without acute abnormality. Soft tissues are grossly normal. IMPRESSION: Enlarged cardiac silhouette which may be due to heart failure or pericardial effusion. Interstitial pulmonary edema. Electronically Signed   By: Fidela Salisbury M.D.   On: 04/22/2017 19:07        Scheduled Meds: . carvedilol  12.5 mg Oral BID WC  . colchicine  0.3 mg Oral Daily  . diltiazem  180 mg Oral Daily  . fluticasone  2 spray Each Nare  Daily  . furosemide  40 mg Intravenous Once  . furosemide  80 mg Intravenous BID  . gabapentin  300 mg Oral BID  . insulin aspart  0-15 Units Subcutaneous TID WC  . insulin aspart  0-5 Units Subcutaneous QHS  . insulin glargine  25 Units Subcutaneous QHS  . rivaroxaban  20 mg Oral QHS  . sodium chloride flush  3 mL Intravenous Q12H   Continuous Infusions: . sodium chloride       LOS: 2 days     Cordelia Poche, MD [facility] 04/24/2017, 12:57 PM Pager: [phone]  If 7PM-7AM, please contact night-coverage [email or web address] Password [facility] 04/24/2017, 12:57 PM

## 2017-04-24 NOTE — Progress Notes (Signed)
Cardiology aware that PICC cannot be placed this late  MD stated to give 80mg  IV lasix bolus now and one in the morning  MD stated to hang amiodarone continuous

## 2017-04-24 NOTE — Progress Notes (Signed)
Ordered cobands, called ortho to apply unna boots

## 2017-04-24 NOTE — Consult Note (Signed)
Cardiology Consultation:   Patient ID: [name]; [id number]; Dec 30, 1970   Admit date: 04/22/2017 Date of Consult: 04/24/2017  Primary Care Provider: [name], MD Primary Cardiologist: John Casino, MD  Primary Electrophysiologist:  N/A   Patient Profile:   John Bailey is a 47 y.o. male with a hx of atrial flutter, chronic combined systolic and diastolic (previous EF 09%, EF improved 50-55% by 2018), morbid obesity, IDDM, HLD and CKD stage IV who is being seen today for the evaluation of acute on chronic diastolic HF at the request of Dr. Lonny Bailey.  History of Present Illness:   John Bailey is a 47 yo male with PMH of atrial flutter, chronic combined systolic and diastolic heart failure with improved EF (previous EF 25%, EF improved 50-55% by 2018), morbid obesity, IDDM, HLD and CKD stage IV followed by Dr. Moshe Bailey.  Patient had a TEE DCCV in May 2017 and that had another cardioversion in October 2018.  Last echocardiogram obtained on 06/25/2016 showed EF 50-55%, distal anterior and apical hypokinesis, restrictive physiology with grade 3 diastolic dysfunction, small pericardial effusion.  During his office visit in October 2018, his weight was 282 pounds.  Patient was admitted in January 2019 for presyncope.  His carvedilol and diltiazem was decreased at that time due to concern of bradycardia.  He was last seen by John Bailey in the office on 04/02/2017, his heart rate was controlled at the time in the 80s.  According to the patient, in order to treat his rheumatoid arthritis, he was started on steroid roughly 9 weeks ago.  He was also off of his Lasix for a period of times.  After his diuretic was restarted in January, he has not noticed significant change in his weight.  Surprisingly he denies any orthopnea or PND.  However he had a significantly worsening lower extremity edema, upper extremity edema and scrotal edema as well.  He eventually sought medical attention at [facility] for his anasarca.  He was given 80 mg IV Lasix followed by 40 mg twice daily of IV Lasix.  So far for the past 36 hours he has been diuresed roughly 2 L.  Lasix has been switched to 80 mg twice daily since.  Initial EKG showed atrial fibrillation with right bundle branch block.  During this hospitalization, his heart rate has been ranging in the 110-130s.    Past Medical History:  Diagnosis Date  . Atrial flutter with rapid ventricular response ([place]) 08/06/2015   a. s/p DCCV in 07/2015 with recurrent atrial fibrillation and DCCV in 12/2016. Initially successful but noted to be back in atrial fibrillation within 2 weeks of DCCV.   [name] Chronic diastolic (congestive) heart failure (HCC)    a. EF 50-55% by echo in 06/2016.  . Diabetes ([place])   . Hypertension     Past Surgical History:  Procedure Laterality Date  . CARDIOVERSION N/A 08/01/2015   Procedure: CARDIOVERSION;  Surgeon: John Hector, MD;  Location: [facility] ENDOSCOPY;  Service: Cardiovascular;  Laterality: N/A;  . CARDIOVERSION N/A 12/18/2016   Procedure: CARDIOVERSION;  Surgeon: John Latch, MD;  Location: [place];  Service: Cardiovascular;  Laterality: N/A;  . CHOLECYSTECTOMY    . TEE WITHOUT CARDIOVERSION N/A 08/01/2015   Procedure: TRANSESOPHAGEAL ECHOCARDIOGRAM (TEE);  Surgeon: John Hector, MD;  Location: [facility] ENDOSCOPY;  Service: Cardiovascular;  Laterality: N/A;     Home Medications:  Prior to Admission medications   Medication Sig Start Date End Date Taking? Authorizing  Provider  Abatacept 125 MG/ML SOAJ Inject 125 mg into the skin every 7 (seven) days. On Tuesday 04/13/17  Yes [provider]  acetaminophen (TYLENOL) 325 MG tablet Take 650 mg by mouth every 6 (six) hours as needed for mild pain.   Yes [provider]  albuterol (PROVENTIL HFA;VENTOLIN HFA) 108 (90 BASE) MCG/ACT inhaler Inhale 1-2 puffs into the lungs every 6 (six) hours as needed for wheezing. 05/30/14  Yes John Bailey, John Cheng,  MD  carvedilol (COREG) 12.5 MG tablet Take 1 tablet (12.5 mg total) by mouth 2 (two) times daily with a meal. 03/31/17  Yes John Bossier, MD  celecoxib (CELEBREX) 100 MG capsule Take 100 mg by mouth daily. 03/04/17  Yes [provider]  colchicine 0.6 MG tablet Take 0.5 tablets (0.3 mg total) by mouth daily. 12/16/16  Yes John Dubois, MD  diltiazem (CARDIZEM CD) 180 MG 24 hr capsule Take 1 capsule (180 mg total) by mouth daily. 04/01/17  Yes John Bossier, MD  fluticasone (FLONASE) 50 MCG/ACT nasal spray Place 2 sprays into the nose daily.  12/29/12  Yes [provider]  furosemide (LASIX) 40 MG tablet Take 40 mg by mouth 2 (two) times daily. 03/24/17  Yes [provider]  gabapentin (NEURONTIN) 300 MG capsule Take 1 capsule (300 mg total) by mouth 2 (two) times daily. Patient taking differently: Take 300 mg by mouth 3 (three) times daily.  03/09/17  Yes John Odea, MD  insulin aspart (NOVOLOG FLEXPEN) 100 UNIT/ML FlexPen Inject 0-12 Units into the skin 3 (three) times daily as needed for high blood sugar. Sliding Scale <70, initiate hypoglycemia protocol, 70-100=0 units, 110-199= 3 units, 200-250=5 units, 251-300=7 units, 301-350=10 units, 351-400=12 units, >400=14 units. Call MD if >450 03/09/17  Yes John Odea, MD  Insulin Glargine (LANTUS) 100 UNIT/ML Solostar Pen Inject 25 Units into the skin daily at 10 pm. 03/09/17  Yes John Bailey, John Blase, MD  potassium chloride SA (KLOR-CON M20) 20 MEQ tablet Take 1 tablet (20 mEq total) by mouth daily. 08/19/15  Yes Hilty, John Corwin, MD  rivaroxaban (XARELTO) 20 MG TABS tablet take 1 tablet by mouth once daily WITH SUPPER Patient taking differently: Take 20 mg by mouth daily with supper.  10/12/16  Yes Hilty, John Corwin, MD  Insulin Pen Needle 31G X 6 MM MISC 1 Device by Does not apply route 2 (two) times daily. 08/08/15   John Cousins, MD    Inpatient Medications: Scheduled Meds: . carvedilol  12.5 mg Oral BID WC  .  colchicine  0.3 mg Oral Daily  . diltiazem  180 mg Oral Daily  . fluticasone  2 spray Each Nare Daily  . furosemide  80 mg Intravenous BID  . gabapentin  300 mg Oral BID  . insulin aspart  0-15 Units Subcutaneous TID WC  . insulin aspart  0-5 Units Subcutaneous QHS  . insulin glargine  25 Units Subcutaneous QHS  . metolazone  2.5 mg Oral Once  . rivaroxaban  20 mg Oral QHS  . sodium chloride flush  3 mL Intravenous Q12H   Continuous Infusions: . sodium chloride     PRN Meds: sodium chloride, acetaminophen, ondansetron (ZOFRAN) IV, sodium chloride flush  Allergies:   No Known Allergies  Social History:   Social History   Socioeconomic History  . Marital status: Married    Spouse name: Not on file  . Number of children: Not on file  . Years of education: Not on file  .  Highest education level: Not on file  Social Needs  . Financial resource strain: Not on file  . Food insecurity - worry: Not on file  . Food insecurity - inability: Not on file  . Transportation needs - medical: Not on file  . Transportation needs - non-medical: Not on file  Occupational History  . Not on file  Tobacco Use  . Smoking status: Never Smoker  . Smokeless tobacco: Never Used  Substance and Sexual Activity  . Alcohol use: No  . Drug use: No  . Sexual activity: Yes  Other Topics Concern  . Not on file  Social History Narrative  . Not on file    Family History:    Family History  Problem Relation Age of Onset  . Heart disease Mother   . Hyperlipidemia Father   . Hypertension Father      ROS:  Please see the history of present illness.   All other ROS reviewed and negative.     Physical Exam/Data:   Vitals:   04/23/17 1937 04/24/17 0001 04/24/17 0526 04/24/17 1214  BP: (!) 132/108 128/82 (!) 146/115 132/89  Pulse: (!) 108 (!) 114 (!) 120   Resp: (!) 22 20 (!) 22 20  Temp: (!) 97.3 F (36.3 C) 97.8 F (36.6 C) 98.1 F (36.7 C) 99.1 F (37.3 C)  TempSrc: Oral Oral Oral Oral   SpO2: 95% 95% 96% 96%  Weight:   (!) 380 lb 8 oz (172.6 kg)   Height:        Intake/Output Summary (Last 24 hours) at 04/24/2017 1453 Last data filed at 04/24/2017 1424 Gross per 24 hour  Intake 720 ml  Output 1750 ml  Net -1030 ml   Filed Weights   04/23/17 0800 04/23/17 1508 04/24/17 0526  Weight: (!) 354 lb 8 oz (160.8 kg) (!) 379 lb 4.8 oz (172 kg) (!) 380 lb 8 oz (172.6 kg)   Body mass index is 51.61 kg/m.  General:  Well nourished, well developed, in no acute distress HEENT: normal Lymph: no adenopathy Neck: no JVD Endocrine:  No thryomegaly Vascular: No carotid bruits; FA pulses 2+ bilaterally without bruits  Cardiac: Irregularly irregular Lungs:  clear to auscultation bilaterally, no wheezing, rhonchi or rales  Abd: soft, nontender, no hepatomegaly  Ext: 2-3+ pitting edema extending all the way to the base of the leg, significant scrotal swelling as well. Musculoskeletal:  No deformities, BUE and BLE strength normal and equal Skin: warm and dry  Neuro:  CNs 2-12 intact, no focal abnormalities noted Psych:  Normal affect   EKG:  The EKG was personally reviewed and demonstrates: Atrial fibrillation of unknown duration Telemetry:  Telemetry was personally reviewed and demonstrates: Atrial fibrillation with heart rate ranging in the 110-130s  Relevant CV Studies:  Echo 06/25/2016 LV EF: 50% -   55%  ------------------------------------------------------------------- Indications:      (I42.9).  ------------------------------------------------------------------- History:   PMH:  Acquired from the patient and from the patient&'s chart.  Paroxysmal atrial fibrillation.  Non-ischemic cardiomyopathy, with an ejection fraction of 40%by echocardiography. The dysfunction is primarily systolic.  Risk factors:  Diabetes mellitus. Morbidly obese. Dyslipidemia.  ------------------------------------------------------------------- Study Conclusions  - Left ventricle: The  cavity size is small. 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 Systolic function was normal. The   estimated ejection fraction was in the range of 50% to 55%.   Distal anterior/apical hypokinesis seen on Definity  contrast   images. Doppler parameters are consistent with restrictive   physiology (grade 3 diastolic dysfunction). The E/A ratio is   >2.5. The E/e&' ratio is >20, suggesting markedly elevated LV   filling pressure. - Mitral valve: Mitral B-notch noted, suggesting elevated LA   pressure. - Left atrium: Moderately dilated. - Right ventricle: The cavity size was mildly dilated. Systolic   function was normal. - Right atrium: Severely dilated. - Tricuspid valve: There was trivial regurgitation. - Pulmonary arteries: PA peak pressure: 19 mm Hg (S). - Inferior vena cava: The vessel was normal in size. The   respirophasic diameter changes were in the normal range (>= 50%),   consistent with normal central venous pressure. - Pericardium, extracardiac: Small pericardial effusion. Features   were not consistent with tamponade physiology.  Impressions:  - Compared to a prior study in 2017, the LVEF has increased to   50-55% with distal anterior/apical hypokinesis as seen on   Definity contrast images.  Laboratory Data:  Chemistry Recent Labs  Lab 04/22/17 1733 04/23/17 1335 04/24/17 0449  NA 140 140 137  K 4.0 4.0 4.6  CL 107 107 103  CO2 22 21* 23  GLUCOSE 97 123* 165*  BUN 51* 48* 47*  CREATININE 2.65* 2.33* 2.33*  CALCIUM 8.5* 8.6* 8.7*  GFRNONAA 28* 32* 32*  GFRAA 32* 37* 37*  ANIONGAP 11 12 11     Recent Labs  Lab 04/22/17 1733  PROT 5.8*  ALBUMIN 2.8*  AST 12*  ALT 10*  ALKPHOS 101  BILITOT 0.6   Hematology Recent Labs  Lab 04/22/17 1733 04/23/17 0736  WBC 8.8 8.4  RBC 3.54* 3.60*  HGB 10.1* 10.1*  HCT 32.4* 32.8*  MCV 91.5 91.1  MCH 28.5 28.1  MCHC 31.2 30.8  RDW  18.2* 17.9*  PLT 344 364   Cardiac EnzymesNo results for input(s): TROPONINI in the last 168 hours.  Recent Labs  Lab 04/22/17 1736  TROPIPOC 0.06    BNP Recent Labs  Lab 04/22/17 1734 04/23/17 0736  BNP 531.3* 627.9*    DDimer No results for input(s): DDIMER in the last 168 hours.  Radiology/Studies:  Dg Chest 2 View  Result Date: 04/22/2017 CLINICAL DATA:  Whole body edema.  Minor shortness of breath. EXAM: CHEST  2 VIEW COMPARISON:  03/06/2017 FINDINGS: Enlarged cardiac silhouette.  Mediastinal contours appear intact. There is no evidence of focal airspace consolidation, pleural effusion or pneumothorax. Mild interstitial pulmonary edema. Osseous structures are without acute abnormality. Soft tissues are grossly normal. IMPRESSION: Enlarged cardiac silhouette which may be due to heart failure or pericardial effusion. Interstitial pulmonary edema. Electronically Signed   By: Fidela Salisbury M.D.   On: 04/22/2017 19:07    Assessment and Plan:   1. Acute on chronic diastolic heart failure:   -Obtain echocardiogram.  Patient is at least 50-60 pounds over his baseline.  Diuresis goal 285 pounds.  -Seen by Dr. Aundra Dubin, plan to start on IV Lasix drip along with a single dose of 2.5 mg metolazone.  We will need to monitor his renal function and also potassium closely.  2. Atrial fibrillation with uncontrolled heart rate: Patient has been compliant with Xarelto, had atrial fibrillation with slow ventricular rate in January 2019.  Previous disease cardioversion was in October 2018.  Plan for inpatient DC cardioversion after adequate diuresis.  Start on IV amiodarone, hopefully will convert prior to DC cardioversion.  Per Dr. Algernon Huxley, will discontinue diltiazem and to continue only on carvedilol for now.  3. Morbid obesity  4. IDDM: Sliding scale insulin, managed by hospitalist service.  5. HLD: He carries a diagnosis of hyperlipidemia, however I am unable to locate any previous  fasting lipid panel.  Will defer to primary care provider.  6. CKD stage IV followed by Dr. Moshe Bailey   For questions or updates, please contact [place] Please consult [email or web address] for contact info under Cardiology/STEMI.   Hilbert Corrigan, [place]  04/24/2017 2:53 PM  Patient seen with PA, agree with the above note.  He has developed marked volume overload.  He also was in atrial fibrillation with RVR at admission.    On exam, he is markedly volume overloaded.  Anasarca with 2+ pitting edema into his abdomen and scrotal edema.  JVP 14 cm.  Heart irregular S1S2, tachy.   Telemetry with atrial fibrillation/RVR in 120s despite Toprol XL and diltiazem CD.   1. Atrial fibrillation with RVR: On review of prior ECGs, last time he was documented to be in NSR was in 1/18. I suspect that he may be better compensated in NSR.  However, will need to diurese some before attempted to cardiovert him. Still with RVR.  - Continue Xarelto.  He says that he has not missed doses.  - Continue Toprol XL but stop diltiazem.  I will use amiodarone gtt to try to control rate and also to hold him in NSR.  I do not think that he will stay in NSR without an antiarrhythmic.  Could consider atrial fibrillation ablation down the road though body habitus will likely make this more difficult to accomplish. Ideally, would not continue amiodarone long-term.  - DCCV after he is better diuresed (will not need TEE as he has not missed any Xarelto recently).  2. Acute on chronic primarily diastolic CHF: Last echo in 4/18 with EF 50-55%, apical hypokinesis.  He is markedly volume overloaded on exam.  Creatinine is 2.3, which is actually lower than prior baseline.  He needs much diuresis.  - Lasix 40 mg IV x 1 then 12 mg/hr.  I will give a dose of metolazone as well.  - Unna boots.  - Echo 3. CKD: Stage IV.  Follow closely with diuresis, creatinine baseline around 2.6.   [name] 04/24/2017 3:26 PM

## 2017-04-24 NOTE — Evaluation (Signed)
Physical Therapy Evaluation Patient Details Name: John Bailey MRN: [id number] DOB: [birth year] Today's Date: 04/24/2017   History of Present Illness  John Bailey is a 47 y.o. rheumatoid arthritis, diabetes mellitus, heart failure, atrial fibrillation. Patient presented with dyspnea and found to have a CHF exacerbation  Clinical Impression  Pt admitted with above complications. Pt currently with functional limitations due to the deficits listed below (see PT Problem List). Required max assist for basic bed mobility, unable to tolerate bedside sitting this morning. However, he apparently got out of bed earlier this morning. Due to poor performance during PT evaluation, lower level goals set at this time. Pt should be able to safely mobilize with at least min assist before returning home. He is fairly sedentary at baseline but reports he walks up his flight of steps each night before bed. HR 110 at rest, up to 134 with bed mobility exertion. Spo2 99% on room air, although moderate dyspnea at rest. Pt will benefit from skilled PT to increase their independence and safety with mobility to allow discharge to the venue listed below.       Follow Up Recommendations SNF(If pt progresses quickly may be appropriate for HHPT) Had very poor performance with PT during evaluation however was mobilizing well with nursing this morning apparently.    Equipment Recommendations  (TBD)    Recommendations for Other Services OT consult     Precautions / Restrictions Precautions Precautions: Fall Restrictions Weight Bearing Restrictions: No      Mobility  Bed Mobility Overal bed mobility: Needs Assistance Bed Mobility: Rolling;Supine to Sit;Sit to Supine Rolling: Max assist   Supine to sit: Max assist Sit to supine: Max assist   General bed mobility comments: Patient attempting to assist however having significant difficulty with mobility this morning. Max assist for LLE and to scoot body to EOB.  Poor control and required assist to keep from sliding off bed. Despite his effort he was unable to keep himself upright due to weakness and fatigue. Max assist for all bed mobility. Using hands as able to pull himself up in bed.  Transfers                    Ambulation/Gait                Stairs            Wheelchair Mobility    Modified Rankin (Stroke Patients Only)       Balance                                             Pertinent Vitals/Pain Pain Assessment: Faces Faces Pain Scale: Hurts even more Pain Location: scrotom and Lt knee Pain Descriptors / Indicators: Discomfort;Constant;Grimacing Pain Intervention(s): Monitored during session;Repositioned    Home Living Family/patient expects to be discharged to:: Private residence Living Arrangements: Spouse/significant other Available Help at Discharge: Family Type of Home: House Home Access: Level entry     Home Layout: Multi-level(Stays in basement downstairs, goes upstairs each night) Home Equipment: Kasandra Knudsen - single point      Prior Function Level of Independence: Needs assistance   Gait / Transfers Assistance Needed: unassisted PTA  ADL's / Homemaking Assistance Needed: Required assist to rise out of his lift chair due to chair breaking. Walks up his stairs each night  Hand Dominance        Extremity/Trunk Assessment   Upper Extremity Assessment Upper Extremity Assessment: Defer to OT evaluation    Lower Extremity Assessment Lower Extremity Assessment: Generalized weakness(bil edema, anasarca)       Communication   Communication: No difficulties  Cognition Arousal/Alertness: Awake/alert Behavior During Therapy: WFL for tasks assessed/performed Overall Cognitive Status: Within Functional Limits for tasks assessed                                        General Comments General comments (skin integrity, edema, etc.): HR 110 at rest,  quickly elevated to 134 with basic bed mobility. Moderate dyspnea however SpO2 99% on room air.    Exercises General Exercises - Lower Extremity Ankle Circles/Pumps: AROM;Both;10 reps;Supine Heel Slides: Strengthening;Both;10 reps;Supine   Assessment/Plan    PT Assessment Patient needs continued PT services  PT Problem List Decreased strength;Decreased range of motion;Decreased activity tolerance;Decreased balance;Decreased mobility;Cardiopulmonary status limiting activity;Obesity;Pain       PT Treatment Interventions DME instruction;Gait training;Stair training;Functional mobility training;Therapeutic activities;Therapeutic exercise;Balance training;Neuromuscular re-education;Patient/family education    PT Goals (Current goals can be found in the Care Plan section)  Acute Rehab PT Goals Patient Stated Goal: Get the swelling down PT Goal Formulation: With patient Time For Goal Achievement: 05/08/17 Potential to Achieve Goals: Fair    Frequency Min 3X/week   Barriers to discharge        Co-evaluation               AM-PAC PT "6 Clicks" Daily Activity  Outcome Measure Difficulty turning over in bed (including adjusting bedclothes, sheets and blankets)?: A Lot Difficulty moving from lying on back to sitting on the side of the bed? : A Lot Difficulty sitting down on and standing up from a chair with arms (e.g., wheelchair, bedside commode, etc,.)?: Unable Help needed moving to and from a bed to chair (including a wheelchair)?: Total Help needed walking in hospital room?: Total Help needed climbing 3-5 steps with a railing? : Total 6 Click Score: 8    End of Session   Activity Tolerance: Patient limited by fatigue Patient left: in bed;with call bell/phone within reach;with bed alarm set Nurse Communication: Mobility status PT Visit Diagnosis: Muscle weakness (generalized) (M62.81);Difficulty in walking, not elsewhere classified (R26.2);Pain    Time: 8756-4332 PT Time  Calculation (min) (ACUTE ONLY): 21 min   Charges:   PT Evaluation $PT Eval High Complexity: 1 High     PT G Codes:        [employer], PT   Ellouise Newer 04/24/2017, 11:32 AM

## 2017-04-24 NOTE — Progress Notes (Signed)
IV team at bedside 

## 2017-04-24 NOTE — Progress Notes (Signed)
Paged MD regarding unsuccessful attempts by RN and IV team  Recommending PICC  Awaiting call back

## 2017-04-24 NOTE — Progress Notes (Signed)
Paged cardiology regarding pt only has one IV access, IV team unable to place PICC line tonight  Pt has amiodarone and lasix continuous ordered and non compatable Awaiting call back  Night shift RN aware

## 2017-04-24 NOTE — Progress Notes (Signed)
  Amiodarone Drug - Drug Interaction Consult Note  Recommendations:   Monitor HR on Carvedilol as prior to admission.  Diltiazem CD discontinued, last dose this am.   Amiodarone is metabolized by the cytochrome P450 system and therefore has the potential to cause many drug interactions. Amiodarone has an average plasma half-life of 50 days (range 20 to 100 days).   There is potential for drug interactions to occur several weeks or months after stopping treatment and the onset of drug interactions may be slow after initiating amiodarone.   []  Statins: Increased risk of myopathy. Simvastatin- restrict dose to 20mg  daily. Other statins: counsel patients to report any muscle pain or weakness immediately.  []  Anticoagulants: Amiodarone can increase anticoagulant effect. Consider warfarin dose reduction. Patients should be monitored closely and the dose of anticoagulant altered accordingly, remembering that amiodarone levels take several weeks to stabilize.  []  Antiepileptics: Amiodarone can increase plasma concentration of phenytoin, the dose should be reduced. Note that small changes in phenytoin dose can result in large changes in levels. Monitor patient and counsel on signs of toxicity.  [x]  Beta blockers: increased risk of bradycardia, AV block and myocardial depression. Sotalol - avoid concomitant use.  [x]   Calcium channel blockers (diltiazem and verapamil): increased risk of bradycardia, AV block and myocardial depression.  []   Cyclosporine: Amiodarone increases levels of cyclosporine. Reduced dose of cyclosporine is recommended.  []  Digoxin dose should be halved when amiodarone is started.  []  Diuretics: increased risk of cardiotoxicity if hypokalemia occurs.  []  Oral hypoglycemic agents (glyburide, glipizide, glimepiride): increased risk of hypoglycemia. Patient's glucose levels should be monitored closely when initiating amiodarone therapy.   []  Drugs that prolong the QT interval:   Torsades de pointes risk may be increased with concurrent use - avoid if possible.  Monitor QTc, also keep magnesium/potassium WNL if concurrent therapy can't be avoided. [name] Antibiotics: e.g. fluoroquinolones, erythromycin. . Antiarrhythmics: e.g. quinidine, procainamide, disopyramide, sotalol. . Antipsychotics: e.g. phenothiazines, haloperidol.  . Lithium, tricyclic antidepressants, and methadone.   Arty Baumgartner, [place] Pager: [phone] or [phone] 04/24/2017 3:22 PM

## 2017-04-24 NOTE — Care Management Note (Signed)
Case Management Note  Patient Details  Name: John Bailey MRN: [id number] Date of Birth: 16-Aug-1970  Subjective/Objective:     CHF              Action/Plan: PCP: [name], MD; has private insurance with [facility] with prescription drug coverage; awaiting for Physical Therapy eval for disposition needs; CM will continue to follow for progression of care.  Expected Discharge Date:    possibly 04/27/2017              Expected Discharge Plan:  Home/Self Care  Discharge planning Services  CM Consult  Status of Service:  In process, will continue to follow  Sherrilyn Rist [phone] 04/24/2017, 9:54 AM

## 2017-04-24 NOTE — Progress Notes (Signed)
MD Mclean aware pt recently received IV lasix 40mg , MD stated do not give second dose. MD stated to begin IV continuous lasix drip

## 2017-04-24 NOTE — Discharge Instructions (Addendum)
[facility]  Discharge Instructions  AV Fistula or Graft Surgery for Dialysis Access  Please refer to the following instructions for your post-procedure care. Your surgeon or physician assistant will discuss any changes with you.  Activity  You may drive the day following your surgery, if you are comfortable and no longer taking prescription pain medication. Resume full activity as the soreness in your incision resolves.  Bathing/Showering  You may shower after you go home. Keep your incision dry for 48 hours. Do not soak in a bathtub, hot tub, or swim until the incision heals completely. You may not shower if you have a hemodialysis catheter.  Incision Care  Clean your incision with mild soap and water after 48 hours. Pat the area dry with a clean towel. You do not need a bandage unless otherwise instructed. Do not apply any ointments or creams to your incision. You may have skin glue on your incision. Do not peel it off. It will come off on its own in about one week. Your arm may swell a bit after surgery. To reduce swelling use pillows to elevate your arm so it is above your heart. Your doctor will tell you if you need to lightly wrap your arm with an ACE bandage.  Diet  Resume your normal diet. There are not special food restrictions following this procedure. In order to heal from your surgery, it is CRITICAL to get adequate nutrition. Your body requires vitamins, minerals, and protein. Vegetables are the best source of vitamins and minerals. Vegetables also provide the perfect balance of protein. Processed food has little nutritional value, so try to avoid this.  Medications  Resume taking all of your medications. If your incision is causing pain, you may take over-the counter pain relievers such as acetaminophen (Tylenol). If you were prescribed a stronger pain medication, please be aware these medications can cause nausea and constipation.  Prevent nausea by taking the medication with a snack or meal. Avoid constipation by drinking plenty of fluids and eating foods with high amount of fiber, such as fruits, vegetables, and grains.  Do not take Tylenol if you are taking prescription pain medications.  Follow up Your surgeon may want to see you in the office following your access surgery. If so, this will be arranged at the time of your surgery.  Please call us immediately for any of the following conditions:  Increased pain, redness, drainage (pus) from your incision site Fever of 101 degrees or higher Severe or worsening pain at your incision site Hand pain or numbness.  Reduce your risk of vascular disease:  Stop smoking. If you would like help, call [facility] at 1-800-QUIT-NOW (519)473-0567) or [place] at [place] your cholesterol Maintain a desired weight Control your diabetes Keep your blood pressure down  Dialysis  It will take several weeks to several months for your new dialysis access to be ready for use. Your surgeon will determine when it is okay to use it. Your nephrologist will continue to direct your dialysis. You can continue to use your Permcath until your new access is ready for use.   05/10/2017 John Bailey [id number] 1970-07-18  Surgeon(s): Early, Arvilla Meres, MD  Procedure(s): Creation of Left Radicephalic Fistula  x Do not stick fistula for 12 weeks    If you have any questions, please call the office at [phone].   Information on my medicine - ELIQUIS (apixaban)  This medication education was reviewed  with me or my healthcare representative as part of my discharge preparation.    Why was Eliquis prescribed for you? Eliquis was prescribed for you to reduce the risk of a blood clot forming that can cause a stroke if you have a medical condition called atrial fibrillation (a type of irregular heartbeat).  What do You need to know about Eliquis ? Take your  Eliquis TWICE DAILY - one tablet in the morning and one tablet in the evening with or without food. If you have difficulty swallowing the tablet whole please discuss with your pharmacist how to take the medication safely.  Take Eliquis exactly as prescribed by your doctor and DO NOT stop taking Eliquis without talking to the doctor who prescribed the medication.  Stopping may increase your risk of developing a stroke.  Refill your prescription before you run out.  After discharge, you should have regular check-up appointments with your healthcare provider that is prescribing your Eliquis.  In the future your dose may need to be changed if your kidney function or weight changes by a significant amount or as you get older.  What do you do if you miss a dose? If you miss a dose, take it as soon as you remember on the same day and resume taking twice daily.  Do not take more than one dose of ELIQUIS at the same time to make up a missed dose.  Important Safety Information A possible side effect of Eliquis is bleeding. You should call your healthcare provider right away if you experience any of the following: ? Bleeding from an injury or your nose that does not stop. ? Unusual colored urine (red or dark brown) or unusual colored stools (red or black). ? Unusual bruising for unknown reasons. ? A serious fall or if you hit your head (even if there is no bleeding).  Some medicines may interact with Eliquis and might increase your risk of bleeding or clotting while on Eliquis. To help avoid this, consult your healthcare provider or pharmacist prior to using any new prescription or non-prescription medications, including herbals, vitamins, non-steroidal anti-inflammatory drugs (NSAIDs) and supplements.  This website has more information on Eliquis (apixaban): http://www.eliquis.com/eliquis/home

## 2017-04-24 NOTE — Progress Notes (Signed)
Ortho tech aware pt has unna boot order and cobands are at nurses station

## 2017-04-24 NOTE — Progress Notes (Signed)
Pharmacy verified amiodarone and lasix are not compatible

## 2017-04-25 ENCOUNTER — Inpatient Hospital Stay (HOSPITAL_COMMUNITY): Payer: BLUE CROSS/BLUE SHIELD

## 2017-04-25 DIAGNOSIS — I361 Nonrheumatic tricuspid (valve) insufficiency: Secondary | ICD-10-CM

## 2017-04-25 LAB — BASIC METABOLIC PANEL
ANION GAP: 15 (ref 5–15)
ANION GAP: 13 (ref 5–15)
BUN: 51 mg/dL — ABNORMAL HIGH (ref 6–20)
BUN: 49 mg/dL — AB (ref 6–20)
CALCIUM: 8.4 mg/dL — AB (ref 8.9–10.3)
CO2: 19 mmol/L — ABNORMAL LOW (ref 22–32)
CO2: 22 mmol/L (ref 22–32)
Calcium: 8.5 mg/dL — ABNORMAL LOW (ref 8.9–10.3)
Chloride: 102 mmol/L (ref 101–111)
Chloride: 99 mmol/L — ABNORMAL LOW (ref 101–111)
Creatinine, Ser: 2.59 mg/dL — ABNORMAL HIGH (ref 0.61–1.24)
Creatinine, Ser: 2.63 mg/dL — ABNORMAL HIGH (ref 0.61–1.24)
GFR calc Af Amer: 33 mL/min — ABNORMAL LOW (ref >60)
GFR calc Af Amer: 32 mL/min — ABNORMAL LOW (ref >60)
GFR, EST NON AFRICAN AMERICAN: 28 mL/min — AB (ref >60)
GFR, EST NON AFRICAN AMERICAN: 28 mL/min — AB (ref >60)
GLUCOSE: 126 mg/dL — AB (ref 65–99)
GLUCOSE: 156 mg/dL — AB (ref 65–99)
Potassium: 6 mmol/L — ABNORMAL HIGH (ref 3.5–5.1)
Potassium: 4.1 mmol/L (ref 3.5–5.1)
SODIUM: 136 mmol/L (ref 135–145)
Sodium: 134 mmol/L — ABNORMAL LOW (ref 135–145)

## 2017-04-25 LAB — RESPIRATORY PANEL BY PCR
ADENOVIRUS-RVPPCR: NOT DETECTED
Bordetella pertussis: NOT DETECTED
CORONAVIRUS 229E-RVPPCR: NOT DETECTED
CORONAVIRUS OC43-RVPPCR: NOT DETECTED
Chlamydophila pneumoniae: NOT DETECTED
Coronavirus HKU1: NOT DETECTED
Coronavirus NL63: NOT DETECTED
INFLUENZA A H1-RVPPCR: NOT DETECTED
INFLUENZA A-RVPPCR: NOT DETECTED
Influenza A H1 2009: NOT DETECTED
Influenza A H3: NOT DETECTED
Influenza B: NOT DETECTED
METAPNEUMOVIRUS-RVPPCR: NOT DETECTED
Mycoplasma pneumoniae: NOT DETECTED
PARAINFLUENZA VIRUS 1-RVPPCR: NOT DETECTED
PARAINFLUENZA VIRUS 4-RVPPCR: NOT DETECTED
Parainfluenza Virus 2: NOT DETECTED
Parainfluenza Virus 3: NOT DETECTED
RESPIRATORY SYNCYTIAL VIRUS-RVPPCR: NOT DETECTED
Rhinovirus / Enterovirus: NOT DETECTED

## 2017-04-25 LAB — GLUCOSE, CAPILLARY
GLUCOSE-CAPILLARY: 120 mg/dL — AB (ref 65–99)
GLUCOSE-CAPILLARY: 150 mg/dL — AB (ref 65–99)
Glucose-Capillary: 166 mg/dL — ABNORMAL HIGH (ref 65–99)
Glucose-Capillary: 195 mg/dL — ABNORMAL HIGH (ref 65–99)

## 2017-04-25 LAB — ECHOCARDIOGRAM COMPLETE
Ao-asc: 36 cm
Area-P 1/2: 5.95 cm2
E decel time: 127 msec
FS: 32 % (ref 28–44)
Height: 72 in
IV/PV OW: 1.09
LA ID, A-P, ES: 53 mm
LA diam index: 1.86 cm/m2
LEFT ATRIUM END SYS DIAM: 53 mm
LV PW d: 14.9 mm — AB (ref 0.6–1.1)
LVOT area: 3.8 cm2
LVOT diameter: 22 mm
MV Dec: 127
MV Peak grad: 6 mmHg
MV pk A vel: 45.1 m/s
MVPKEVEL: 121 m/s
MVSPHT: 37 ms
Weight: 5343.95 oz

## 2017-04-25 LAB — URINALYSIS, ROUTINE W REFLEX MICROSCOPIC
Bilirubin Urine: NEGATIVE
Glucose, UA: NEGATIVE mg/dL
Hgb urine dipstick: NEGATIVE
Ketones, ur: NEGATIVE mg/dL
Leukocytes, UA: NEGATIVE
Nitrite: NEGATIVE
Protein, ur: NEGATIVE mg/dL
Specific Gravity, Urine: 1.01 (ref 1.005–1.030)
pH: 5 (ref 5.0–8.0)

## 2017-04-25 MED ORDER — METOLAZONE 2.5 MG PO TABS
2.5000 mg | ORAL_TABLET | Freq: Once | ORAL | Status: AC
  Administered 2017-04-25: 2.5 mg via ORAL
  Filled 2017-04-25: qty 1

## 2017-04-25 NOTE — Progress Notes (Signed)
  Echocardiogram 2D Echocardiogram has been performed.  Elmira Olkowski G Cyrus Ramsburg 04/25/2017, 10:01 AM

## 2017-04-25 NOTE — Progress Notes (Signed)
Orthopedic Tech Progress Note Patient Details:  John Bailey 1971-03-15 [id number]  Ortho Devices Type of Ortho Device: Ace wrap, Unna boot Ortho Device/Splint Interventions: Application   Post Interventions Patient Tolerated: Well Instructions Provided: Care of device   [name] 04/25/2017, 7:48 AM

## 2017-04-25 NOTE — Progress Notes (Signed)
Per IV team, PICC was not placed due to multiple IV access orders existing at one time, and per protocol IV team began with least invasive efforts first. Once two access/sites were obtained, they removed the PICC order as it was "no longer warranted".

## 2017-04-25 NOTE — Progress Notes (Signed)
IV team order to call, after no response from IV Team Page earlier. Its possible there is no weekend pager number?

## 2017-04-25 NOTE — Progress Notes (Signed)
Page to IV team to notify of DBIV stat order entered to re-check potassium level.

## 2017-04-25 NOTE — Progress Notes (Signed)
Attempted to stand patient, unsuccessful. Pt unable to bend his knee/bear weight long enough to sit at edge of bed.  Pt lifted in hoyer to zero out bed, pt also weight via lift.  Weights documented.

## 2017-04-25 NOTE — Progress Notes (Signed)
Pt awake for report, c/o left knee discomfort; warm compress applied to satisfaction. Pt with some O2 in place via nasal canula denying other complaints at this time. Pt has both IV infusions running; IV sites intact at this time. Per report pt has weight discrepancy today; no evidence of output supporting weight drop.

## 2017-04-25 NOTE — Progress Notes (Signed)
No BMET results noted in chart, per chart the lab has not even begun to process this test. IV Team RN verifies sending specimen to lab, lab denies receipt of specimen. States RNs should call facilities to check the walls/tube system for missing tube, one of their receptacles may not be operating properly.  IV team agrees to redraw the lab, to avoid FURTHER delay.  Cards PA calls simultaneous to lab redraw, notified of extensive list of barriers to completion of this lab.

## 2017-04-25 NOTE — Progress Notes (Signed)
CSW spoke with pt regarding PT recommendation for SNF- patient states he feels as if he can manage at home as he has been- has wife and children to assist if needed.  Pt agreeable to home health services if recommended  CSW signing off  Jorge Ny, [place] Social Worker [phone]

## 2017-04-25 NOTE — Progress Notes (Signed)
3e15 [place] Potassium 4.1. please see notes if further questions re delay. thank you   Page to Cardiology PA, Eulas Post. Notified of normal potassium.

## 2017-04-25 NOTE — Progress Notes (Addendum)
Patient had a potassium level of 6.0 on morning labs. Dr Aundra Dubin arrives to unit at 1215 curious why this was not reported; per hospital/lab policy this lab is only critical at 6.2. Which is why no call was made, and it wasn't flagged in a chart as critical for staff, in turn not an indication for critical note or call to MD per lab.   Original lab result does state repeated to verify; stat BMET order is in place and stat DBIV order entered for IV blood draw.   Dr Aundra Dubin also expresses curiosity as to why PICC line wasn't placed when that was what the order was for. Unclear at this time.

## 2017-04-25 NOTE — Progress Notes (Addendum)
Patient ID: John Bailey, male   DOB: 09/16/1970, 47 y.o.   MRN: [id number]     Advanced Heart Failure Rounding Note  PCP-Cardiologist: Pixie Casino, MD   Subjective:    Lasix infusion started this morning.  He was net negative but not markedly so.  Labs from this am show hyperkalemia.  Creatinine 2.59 today which is around his usual baseline but higher than yesterday. Weight does not appear accurate.   Now with Unna boot on right leg.   Echo: Ef 55-60%, severe LVH.   Objective:   Weight Range: (!) 333 lb 16 oz (151.5 kg) Body mass index is 45.3 kg/m.   Vital Signs:   Temp:  [98.6 F (37 C)-101.5 F (38.6 C)] 98.6 F (37 C) (02/10 0540) Pulse Rate:  [80-109] 109 (02/10 0540) Resp:  [19-22] 22 (02/09 2334) BP: (141-151)/(77-102) 141/89 (02/10 0540) SpO2:  [95 %-100 %] 100 % (02/10 0540) Weight:  [333 lb 16 oz (151.5 kg)] 333 lb 16 oz (151.5 kg) (02/10 0540) Last BM Date: 04/22/17  Weight change: Filed Weights   04/23/17 1508 04/24/17 0526 04/25/17 0540  Weight: (!) 379 lb 4.8 oz (172 kg) (!) 380 lb 8 oz (172.6 kg) (!) 333 lb 16 oz (151.5 kg)    Intake/Output:   Intake/Output Summary (Last 24 hours) at 04/25/2017 1216 Last data filed at 04/25/2017 1100 Gross per 24 hour  Intake 1189.4 ml  Output 1800 ml  Net -610.6 ml      Physical Exam    General:  Obese HEENT: Normal Neck: Supple. JVP 14+. No lymphadenopathy or thyromegaly appreciated. Cor: PMI nondisplaced. Irregular rate & rhythm. No rubs, gallops or murmurs. Lungs: Decreased breath sounds at bases.  Abdomen: Soft, nontender, nondistended. No hepatosplenomegaly. No bruits or masses. Good bowel sounds. Extremities: No cyanosis, clubbing, rash.  Bilateral lower legs wrapped, edema up into abdomen.  Neuro: Alert & orientedx3, cranial nerves grossly intact. moves all 4 extremities w/o difficulty. Affect pleasant   Telemetry   Atrial fibrillation rate around 100, personally reviewed.   Labs     CBC Recent Labs    04/22/17 1733 04/23/17 0736  WBC 8.8 8.4  NEUTROABS 5.7 5.5  HGB 10.1* 10.1*  HCT 32.4* 32.8*  MCV 91.5 91.1  PLT 344 416   Basic Metabolic Panel Recent Labs    04/24/17 0449 04/25/17 0546  NA 137 136  K 4.6 6.0*  CL 103 102  CO2 23 19*  GLUCOSE 165* 126*  BUN 47* 51*  CREATININE 2.33* 2.59*  CALCIUM 8.7* 8.5*   Liver Function Tests Recent Labs    04/22/17 1733  AST 12*  ALT 10*  ALKPHOS 101  BILITOT 0.6  PROT 5.8*  ALBUMIN 2.8*   No results for input(s): LIPASE, AMYLASE in the last 72 hours. Cardiac Enzymes No results for input(s): CKTOTAL, CKMB, CKMBINDEX, TROPONINI in the last 72 hours.  BNP: BNP (last 3 results) Recent Labs    03/06/17 1800 04/22/17 1734 04/23/17 0736  BNP 791.5* 531.3* 627.9*    ProBNP (last 3 results) No results for input(s): PROBNP in the last 8760 hours.   D-Dimer No results for input(s): DDIMER in the last 72 hours. Hemoglobin A1C No results for input(s): HGBA1C in the last 72 hours. Fasting Lipid Panel No results for input(s): CHOL, HDL, LDLCALC, TRIG, CHOLHDL, LDLDIRECT in the last 72 hours. Thyroid Function Tests No results for input(s): TSH, T4TOTAL, T3FREE, THYROIDAB in the last 72 hours.  Invalid input(s): FREET3  Other results:   Imaging     No results found.   Medications:     Scheduled Medications: . carvedilol  12.5 mg Oral BID WC  . colchicine  0.3 mg Oral Daily  . fluticasone  2 spray Each Nare Daily  . furosemide  40 mg Intravenous Once  . furosemide  80 mg Intravenous Once  . gabapentin  300 mg Oral BID  . insulin aspart  0-15 Units Subcutaneous TID WC  . insulin aspart  0-5 Units Subcutaneous QHS  . insulin glargine  25 Units Subcutaneous QHS  . rivaroxaban  20 mg Oral QHS  . sodium chloride flush  3 mL Intravenous Q12H     Infusions: . sodium chloride    . amiodarone 30 mg/hr (04/25/17 0425)  . furosemide (LASIX) infusion 12 mg/hr (04/24/17 2313)     PRN  Medications:  sodium chloride, acetaminophen, ondansetron (ZOFRAN) IV, sodium chloride flush    Assessment/Plan   1. Atrial fibrillation with RVR: On review of prior ECGs, last time he was documented to be in NSR was in 1/18. I suspect that he may be better compensated in NSR.  However, will need to diurese some before attempted to cardiovert him. HR better controlled on amiodarone.  - Continue Xarelto.  He says that he has not missed doses.  - Continue Coreg.   - I will use amiodarone gtt to try to control rate and also to hold him in NSR.  I do not think that he will stay in NSR without an antiarrhythmic.  Could consider atrial fibrillation ablation down the road though body habitus will likely make this more difficult to accomplish. Ideally, would not continue amiodarone long-term.  - DCCV after he is better diuresed (will not need TEE as he has not missed any Xarelto recently).  2. Acute on chronic diastolic CHF: Echo this admission with severe LVH, E 55-60%.  He is markedly volume overloaded on exam.  Creatinine today 2.59, this is up from yesterday but usual baseline is around 2.6.   - Continue Lasix gtt at 12 mg/hr (just started early this morning) and add metolazone 2.5 mg x 1 again today.   - Unna boots.  3. CKD: Stage IV.  Follow closely with diuresis, creatinine baseline around 2.6.  Creatinine 2.59 today, K 6 but not sure this is accurate.   - Will repeat BMET stat. Will need Veltassa if high.  4. HTN: BP elevated at times, follow for now on current meds.   Length of Stay: 3  [name], MD  04/25/2017, 12:16 PM  Advanced Heart Failure Team Pager [phone] (M-F; 7a - 4p)  Please contact [facility] for night-coverage after hours (4p -7a ) and weekends on [email or web address]

## 2017-04-25 NOTE — Progress Notes (Addendum)
PROGRESS NOTE    John Bailey  UTM:[id number] DOB: 11-29-1970 DOA: 04/22/2017 PCP: [name], MD   Brief Narrative: John Bailey is a 47 y.o. rheumatoid arthritis, diabetes mellitus, heart failure, atrial fibrillation. Patient presented with dyspnea and found to have a CHF exacerbation. Diuresing.   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 Per chart review, he was 341 lbs three weeks ago and 304 lbs just over one month ago. UOP of 2.4 L over last 24 hours. Weight with significant drop, which does not corollate with degree of diuresis. Echocardiogram significant for an EF of 55-60% and severe LVH with dilated left atrium and trivial pericardial effusion; unable to assess diastolic dysfunction -Cardiology recommendations: IV lasix and metolazone  -Daily weight/strict in and out  Anasarca Secondary to fluid overload. -Diuresis as above  Rheumatoid arthritis Patient on Abatacept every Tuesday.  Atrial fibrillation with RVR Rate still elevated. Asymptomatic. May need to reconsider Xarelto in setting of CKD IV -Cardizem switched to amiodarone per cardiology. Plan for DCCV at some point  CKD Stage IV Stable.  Hyperkalemia Did not receive potassium supplementation with diuresis. Would expect potassium to be low. Creatinine did worsen, but only slightly so and back to around baseline. Cardiology repeating BMP.  Fever Asymptomatic. -check RVP   DVT prophylaxis: Xarelto Code  Status: Full code Family Communication: None at bedside Disposition Plan: Likely SNF when medically stable   Consultants:   Cardiology  Procedures:   None  Antimicrobials:  None    Subjective: No chest pain  Objective: Vitals:   04/25/17 0540 04/25/17 1220 04/25/17 1328 04/25/17 1329  BP: (!) 141/89 129/72    Pulse: (!) 109 90    Resp:  20    Temp: 98.6 F (37 C) 99.2 F (37.3 C)    TempSrc: Oral Oral    SpO2: 100% 100%    Weight: (!) 151.5 kg (333 lb 16 oz)  (!) 176.2 kg (388 lb 8 oz) (!) 175 kg (385 lb 12.9 oz)  Height:        Intake/Output Summary (Last 24 hours) at 04/25/2017 1407 Last data filed at 04/25/2017 1100 Gross per 24 hour  Intake 1189.4 ml  Output 2775 ml  Net -1585.6 ml   Filed Weights   04/25/17 0540 04/25/17 1328 04/25/17 1329  Weight: (!) 151.5 kg (333 lb 16 oz) (!) 176.2 kg (388 lb 8 oz) (!) 175 kg (385 lb 12.9 oz)    Examination:  General exam: Appears calm and comfortable Respiratory system: Clear to auscultation bilaterally. Unlabored work of breathing. Cardiovascular system: S1 & S2 heard, irregular rhythm, fast heart rate. No murmurs, rubs, gallops or clicks. 2+ edema of whole body with tight skin Gastrointestinal system: Abdomen is significantly edematous. Central nervous system: Alert and oriented. No focal neurological deficits. Extremities: No calf tenderness. Bilateral Unna boots Skin: No cyanosis. No rashes Psychiatry: Judgement and insight appear normal. Mood & affect appropriate.     Data Reviewed: I have personally reviewed following labs and  imaging studies  CBC: Recent Labs  Lab 04/22/17 1733 04/23/17 0736  WBC 8.8 8.4  NEUTROABS 5.7 5.5  HGB 10.1* 10.1*  HCT 32.4* 32.8*  MCV 91.5 91.1  PLT 344 269   Basic Metabolic Panel: Recent Labs  Lab 04/22/17 1733 04/23/17 1335 04/24/17 0449 04/25/17 0546  NA 140 140 137 136  K 4.0 4.0 4.6 6.0*  CL 107 107 103 102  CO2 22 21* 23 19*  GLUCOSE 97 123* 165* 126*   BUN 51* 48* 47* 51*  CREATININE 2.65* 2.33* 2.33* 2.59*  CALCIUM 8.5* 8.6* 8.7* 8.5*   GFR: Estimated Creatinine Clearance: 60 mL/min (A) (by C-G formula based on SCr of 2.59 mg/dL (H)). Liver Function Tests: Recent Labs  Lab 04/22/17 1733  AST 12*  ALT 10*  ALKPHOS 101  BILITOT 0.6  PROT 5.8*  ALBUMIN 2.8*   No results for input(s): LIPASE, AMYLASE in the last 168 hours. No results for input(s): AMMONIA in the last 168 hours. Coagulation Profile: No results for input(s): INR, PROTIME in the last 168 hours. Cardiac Enzymes: No results for input(s): CKTOTAL, CKMB, CKMBINDEX, TROPONINI in the last 168 hours. BNP (last 3 results) No results for input(s): PROBNP in the last 8760 hours. HbA1C: No results for input(s): HGBA1C in the last 72 hours. CBG: Recent Labs  Lab 04/24/17 1144 04/24/17 1610 04/24/17 2206 04/25/17 0729 04/25/17 1206  GLUCAP 143* 141* 139* 120* 166*   Lipid Profile: No results for input(s): CHOL, HDL, LDLCALC, TRIG, CHOLHDL, LDLDIRECT in the last 72 hours. Thyroid Function Tests: No results for input(s): TSH, T4TOTAL, FREET4, T3FREE, THYROIDAB in the last 72 hours. Anemia Panel: No results for input(s): VITAMINB12, FOLATE, FERRITIN, TIBC, IRON, RETICCTPCT in the last 72 hours. Sepsis Labs: No results for input(s): PROCALCITON, LATICACIDVEN in the last 168 hours.  No results found for this or any previous visit (from the past 240 hour(s)).       Radiology Studies: No results found.      Scheduled Meds: . carvedilol  12.5 mg Oral BID WC  . colchicine  0.3 mg Oral Daily  . fluticasone  2 spray Each Nare Daily  . furosemide  40 mg Intravenous Once  . furosemide  80 mg Intravenous Once  . gabapentin  300 mg Oral BID  . insulin aspart  0-15 Units Subcutaneous TID WC  . insulin aspart  0-5 Units Subcutaneous QHS  . insulin glargine  25 Units Subcutaneous QHS  . rivaroxaban  20 mg Oral QHS  . sodium chloride flush  3 mL Intravenous Q12H     Continuous Infusions: . sodium chloride    . amiodarone 30 mg/hr (04/25/17 1251)  . furosemide (LASIX) infusion 12 mg/hr (04/24/17 2313)     LOS: 3 days     Cordelia Poche, MD [facility] 04/25/2017, 2:07 PM Pager: [phone]  If 7PM-7AM, please contact night-coverage [email or web address] Password [email or web address] 04/25/2017, 2:07 PM

## 2017-04-25 NOTE — Progress Notes (Signed)
Pt reposition with pillow at the lateral side of the left thigh/knee; with hopes this will prevent natural spreading of the legs to provide space for edematous scrotum, and limit involuntary movement.

## 2017-04-26 ENCOUNTER — Inpatient Hospital Stay (HOSPITAL_COMMUNITY): Payer: BLUE CROSS/BLUE SHIELD

## 2017-04-26 DIAGNOSIS — N170 Acute kidney failure with tubular necrosis: Secondary | ICD-10-CM

## 2017-04-26 DIAGNOSIS — I5033 Acute on chronic diastolic (congestive) heart failure: Secondary | ICD-10-CM

## 2017-04-26 DIAGNOSIS — N184 Chronic kidney disease, stage 4 (severe): Secondary | ICD-10-CM

## 2017-04-26 LAB — BASIC METABOLIC PANEL
Anion gap: 13 (ref 5–15)
BUN: 51 mg/dL — AB (ref 6–20)
CO2: 21 mmol/L — ABNORMAL LOW (ref 22–32)
Calcium: 8.1 mg/dL — ABNORMAL LOW (ref 8.9–10.3)
Chloride: 100 mmol/L — ABNORMAL LOW (ref 101–111)
Creatinine, Ser: 2.85 mg/dL — ABNORMAL HIGH (ref 0.61–1.24)
GFR calc Af Amer: 29 mL/min — ABNORMAL LOW (ref >60)
GFR, EST NON AFRICAN AMERICAN: 25 mL/min — AB (ref >60)
GLUCOSE: 143 mg/dL — AB (ref 65–99)
POTASSIUM: 4.1 mmol/L (ref 3.5–5.1)
Sodium: 134 mmol/L — ABNORMAL LOW (ref 135–145)

## 2017-04-26 LAB — GLUCOSE, CAPILLARY
GLUCOSE-CAPILLARY: 135 mg/dL — AB (ref 65–99)
GLUCOSE-CAPILLARY: 110 mg/dL — AB (ref 65–99)
Glucose-Capillary: 145 mg/dL — ABNORMAL HIGH (ref 65–99)
Glucose-Capillary: 133 mg/dL — ABNORMAL HIGH (ref 65–99)

## 2017-04-26 MED ORDER — METHOCARBAMOL 500 MG PO TABS
500.0000 mg | ORAL_TABLET | Freq: Three times a day (TID) | ORAL | Status: DC | PRN
  Administered 2017-04-26 – 2017-05-11 (×5): 500 mg via ORAL
  Filled 2017-04-26 (×7): qty 1

## 2017-04-26 MED ORDER — ABATACEPT 125 MG/ML ~~LOC~~ SOAJ: 125.0000 mg | SUBCUTANEOUS | Status: DC

## 2017-04-26 MED ORDER — METOLAZONE 5 MG PO TABS
5.0000 mg | ORAL_TABLET | Freq: Once | ORAL | Status: AC
  Administered 2017-04-26: 5 mg via ORAL
  Filled 2017-04-26: qty 1

## 2017-04-26 NOTE — Plan of Care (Signed)
  Progressing Clinical Measurements: Ability to maintain clinical measurements within normal limits will improve 04/26/2017 1121 - Progressing by Alonna Buckler, RN Diagnostic test results will improve 04/26/2017 1121 - Progressing by Alonna Buckler, RN Respiratory complications will improve 04/26/2017 1121 - Progressing by Alonna Buckler, RN Cardiovascular complication will be avoided 04/26/2017 1121 - Progressing by Alonna Buckler, RN

## 2017-04-26 NOTE — Progress Notes (Signed)
Patient ID: John Bailey, male   DOB: [birth year], 47 y.o.   MRN: [id number]     Advanced Heart Failure Rounding Note  PCP-Cardiologist: John Casino, MD  HF Cardiologist: John Champagne, MD  Subjective:    John Bailey feels "better" this AM. Notices some improvement of abdomen swelling, but no change in extremities with bilateral leg wraps. Bilateral knee pain, thought to be from RA, is bothersome. Denies SOB, chest pain, fevers/chills, cough, palpitations. Has not been out of bed. Foley catheter draining yellow urine.   Remains on 12mg /hr IV Lasix. Net negative 590 mL, but weight up 2 lbs since yesterday. Unsure if weight is accurate. K 4.1, Cr 2.85 (increased this admission, but near baseline).  Echo (04/25/17): Ef 55-60%, severe LVH.   Objective:   Weight Range: (!) 387 lb 5.6 oz (175.7 kg) Body mass index is 52.53 kg/m.   Vital Signs:   Temp:  [99.2 F (37.3 C)-101.9 F (38.8 C)] 99.7 F (37.6 C) (02/11 0513) Pulse Rate:  [84-90] 84 (02/11 0513) Resp:  [18-20] 18 (02/11 0513) BP: (113-129)/(57-72) 118/67 (02/11 0513) SpO2:  [100 %] 100 % (02/11 0513) Weight:  [385 lb 12.9 oz (175 kg)-388 lb 8 oz (176.2 kg)] 387 lb 5.6 oz (175.7 kg) (02/11 0513) Last BM Date: 04/25/17  Weight change: Filed Weights   04/25/17 1328 04/25/17 1329 04/26/17 0513  Weight: (!) 388 lb 8 oz (176.2 kg) (!) 385 lb 12.9 oz (175 kg) (!) 387 lb 5.6 oz (175.7 kg)    Intake/Output:   Intake/Output Summary (Last 24 hours) at 04/26/2017 0936 Last data filed at 04/26/2017 0700 Gross per 24 hour  Intake 1284.53 ml  Output 1875 ml  Net -590.47 ml      Physical Exam    General:  Obese male, NAD. HEENT: Normal Neck: Thick. Supple. JVP to ear. No lymphadenopathy or thyromegaly appreciated. Cor: PMI nondisplaced. Irregular rate and rhythm, tachycardic. No rubs, gallops or murmurs. Lungs: Decreased breath sounds at bases.  Abdomen: Large, non-tender, distended abdomen. No hepatosplenomegaly. No  bruits or masses. Good bowel sounds. Extremities: No cyanosis, clubbing, rash. Anasarca. BLE wrapped. Hot packs to BL knees.  Neuro: Alert & orientedx3, cranial nerves grossly intact. moves all 4 extremities w/o difficulty. Affect pleasant   Telemetry   Atrial fibrillation, HR 105+, personally reviewed   Labs    CBC No results for input(s): WBC, NEUTROABS, HGB, HCT, MCV, PLT in the last 72 hours. Basic Metabolic Panel Recent Labs    04/25/17 1521 04/26/17 0000  NA 134* 134*  K 4.1 4.1  CL 99* 100*  CO2 22 21*  GLUCOSE 156* 143*  BUN 49* 51*  CREATININE 2.63* 2.85*  CALCIUM 8.4* 8.1*   Liver Function Tests No results for input(s): AST, ALT, ALKPHOS, BILITOT, PROT, ALBUMIN in the last 72 hours. No results for input(s): LIPASE, AMYLASE in the last 72 hours. Cardiac Enzymes No results for input(s): CKTOTAL, CKMB, CKMBINDEX, TROPONINI in the last 72 hours.  BNP: BNP (last 3 results) Recent Labs    03/06/17 1800 04/22/17 1734 04/23/17 0736  BNP 791.5* 531.3* 627.9*    ProBNP (last 3 results) No results for input(s): PROBNP in the last 8760 hours.   D-Dimer No results for input(s): DDIMER in the last 72 hours. Hemoglobin A1C No results for input(s): HGBA1C in the last 72 hours. Fasting Lipid Panel No results for input(s): CHOL, HDL, LDLCALC, TRIG, CHOLHDL, LDLDIRECT in the last 72 hours. Thyroid Function Tests No results for input(s):  TSH, T4TOTAL, T3FREE, THYROIDAB in the last 72 hours.  Invalid input(s): FREET3  Other results:   Imaging    No results found.   Medications:     Scheduled Medications: . carvedilol  12.5 mg Oral BID WC  . colchicine  0.3 mg Oral Daily  . fluticasone  2 spray Each Nare Daily  . gabapentin  300 mg Oral BID  . insulin aspart  0-15 Units Subcutaneous TID WC  . insulin aspart  0-5 Units Subcutaneous QHS  . insulin glargine  25 Units Subcutaneous QHS  . rivaroxaban  20 mg Oral QHS  . sodium chloride flush  3 mL  Intravenous Q12H    Infusions: . sodium chloride    . amiodarone 30 mg/hr (04/26/17 0354)  . furosemide (LASIX) infusion 12 mg/hr (04/24/17 2313)    PRN Medications: sodium chloride, acetaminophen, ondansetron (ZOFRAN) IV, sodium chloride flush    Assessment/Plan   1. Atrial fibrillation with RVR: On review of prior ECGs, last time he was documented to be in NSR was in 1/18. I suspect that he may be better compensated in NSR.  However, will need to diurese some before attempted to cardiovert him. HR better controlled on amiodarone.  - Continue Xarelto.  He says that he has not missed doses.  - Continue Coreg 12.5 mg BID - Continue IV amiodarone 30 mg/hr for rate control. May consider atrial fibrillation ablation in the future, although body habitus will likely make this difficult. Ideally, would not continue amiodarone long term.   - Plan for DCCV after fluid status improves (no need for TEE as he has not missed any Xarelto lately)  2. Acute on chronic diastolic CHF: Echo this admission with severe LVH, E 55-60%.  - Continues to be markedly volume overloaded on exam  - Continue Lasix gtt 12 mg/hr for now. Hold metolazone with AKI as below.  - K Stable at 4.1 today.   - Bilateral UNNA boots.  3. AKI on CKD: Stage IV.  - Cr baseline around 2.6; elevated to 2.85 today. - K 6 yesterday, redrawn: 4.1.  4. Fever of Unknown Origin - Tmax this admission 101.9. - CBC pending and blood cx sent 0000 - Foley catheter in place, no clear focus of infection  4. HTN:  - BP improved over past 24 hrs with  - Continue on meds as above.   5. Knee pain: - Likely related to rheumatoid arthritis.   Length of Stay: [place], John Bailey  04/26/2017, 9:36 AM  Advanced Heart Failure Team Pager [phone] (M-F; 7a - 4p)  Please contact [facility] for night-coverage after hours (4p -7a ) and weekends on [email or web address]  Patient seen with PA, agree with the above note.    Creatinine up to  2.85 today.  He did not diurese vigorously yesterday.  Not able to stand due to pain in his knee.   On exam, JVP to earlobe.  He has diffuse anasarca.  Irregular heart rhythm, rate controlled.   He needs significant ongoing diuresis, but this is impaired by cardiorenal syndrome and worsening creatinine.   - Increase Lasix to 15 mg/hr today and will give metolazone 5 mg x 1 today.  - If he does not start to diurese and if creatinine continues to worsen, may end up needing nephrology involvement.  He may need at least temporary hemodialysis to remove volume.   He remains on amiodarone gtt with rate control.  Will plan DCCV once volume is better controlled.  John Bailey 04/26/2017 1:14 PM

## 2017-04-26 NOTE — Progress Notes (Signed)
Patient transported for xray of bilateral knees.  Patient updated on plan of care.

## 2017-04-26 NOTE — Progress Notes (Signed)
PROGRESS NOTE    John Bailey  BJY:[id number] DOB: April 27, 1970 DOA: 04/22/2017 PCP: [name], MD   Brief Narrative: John Bailey is a 47 y.o. rheumatoid arthritis, diabetes mellitus, heart failure, atrial fibrillation. Patient presented with dyspnea and found to have a CHF exacerbation. Diuresing.   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 Per chart review, he was 341 lbs three weeks ago and 304 lbs just over one month ago. UOP of 1.87 L over last 24 hours. Weight unchanged in setting of IV diuresis. Echocardiogram significant for an EF of 55-60% and severe LVH with dilated left atrium and trivial pericardial effusion; unable to assess diastolic dysfunction -Cardiology recommendations: IV lasix drip and metolazone  -Daily weight/strict in and out  Anasarca Secondary to fluid overload. -Diuresis as above  Rheumatoid arthritis Patient on Abatacept every Tuesday. States he has knee pain and has a history of effusion. Exam difficult secondary to anasarca. -Continue abatacept -Bilateral knee x-ray  Atrial fibrillation with RVR Rate controlled. May need to reconsider Xarelto in setting of CKD IV -On amiodarone per cardiology. Plan for DCCV at some point  CKD Stage IV Stable.  Hyperkalemia Likely erroneous. Resolved.  Fever Asymptomatic. Possibly related to rheumatoid arthritis. No source of infection. RVP negative -blood cultures pending   DVT prophylaxis: Xarelto Code  Status: Full code Family Communication: Partner and friend at bedside Disposition Plan: Likely home when medically stable (patient refusing SNF)   Consultants:   Cardiology  Procedures:   None  Antimicrobials:  None    Subjective: Knee pain bilaterally. Arm pain.  Objective: Vitals:   04/25/17 1951 04/25/17 2200 04/26/17 0513 04/26/17 1031  BP: (!) 113/57  118/67 106/63  Pulse: 86  84 79  Resp: 18  18   Temp: (!) 101.9 F (38.8 C) 99.8 F (37.7 C) 99.7 F (37.6 C)   TempSrc: Oral Oral Oral   SpO2: 100%  100%   Weight:   (!) 175.7 kg (387 lb 5.6 oz)   Height:        Intake/Output Summary (Last 24 hours) at 04/26/2017 1038 Last data filed at 04/26/2017 1000 Gross per 24 hour  Intake 1790.63 ml  Output 1875 ml  Net -84.37 ml   Filed Weights   04/25/17 1328 04/25/17 1329 04/26/17 0513  Weight: (!) 176.2 kg (388 lb 8 oz) (!) 175 kg (385 lb 12.9 oz) (!) 175.7 kg (387 lb 5.6 oz)    Examination:  General exam: Appears calm and comfortable Respiratory system: Clear to auscultation bilaterally. Unlabored work of breathing. Cardiovascular system: S1 & S2 heard, irregular rhythm, normal heart rate. No murmurs, rubs, gallops or clicks. 2+ edema of arms, hands, abdomen and legs Gastrointestinal system: Soft, non-tender, non-distended, no guarding, no rebound, no masses felt. Central nervous system: Alert and oriented. No focal neurological deficits. Extremities: No calf tenderness. Bilateral Unna boots Skin: No cyanosis. No rashes Psychiatry: Judgement and insight appear normal. Mood & affect appropriate.     Data Reviewed: I have personally reviewed following  labs and imaging studies  CBC: Recent Labs  Lab 04/22/17 1733 04/23/17 0736  WBC 8.8 8.4  NEUTROABS 5.7 5.5  HGB 10.1* 10.1*  HCT 32.4* 32.8*  MCV 91.5 91.1  PLT 344 016   Basic Metabolic Panel: Recent Labs  Lab 04/23/17 1335 04/24/17 0449 04/25/17 0546 04/25/17 1521 04/26/17 0000  NA 140 137  136 134* 134*  K 4.0 4.6 6.0* 4.1 4.1  CL 107 103 102 99* 100*  CO2 21* 23 19* 22 21*  GLUCOSE 123* 165* 126* 156* 143*  BUN 48* 47* 51* 49* 51*  CREATININE 2.33* 2.33* 2.59* 2.63* 2.85*  CALCIUM 8.6* 8.7* 8.5* 8.4* 8.1*   GFR: Estimated Creatinine Clearance: 54.6 mL/min (A) (by C-G formula based on SCr of 2.85 mg/dL (H)). Liver Function Tests: Recent Labs  Lab 04/22/17 1733  AST 12*  ALT 10*  ALKPHOS 101  BILITOT 0.6  PROT 5.8*  ALBUMIN 2.8*   No results for input(s): LIPASE, AMYLASE in the last 168 hours. No results for input(s): AMMONIA in the last 168 hours. Coagulation Profile: No results for input(s): INR, PROTIME in the last 168 hours. Cardiac Enzymes: No results for input(s): CKTOTAL, CKMB, CKMBINDEX, TROPONINI in the last 168 hours. BNP (last 3 results) No results for input(s): PROBNP in the last 8760 hours. HbA1C: No results for input(s): HGBA1C in the last 72 hours. CBG: Recent Labs  Lab 04/25/17 0729 04/25/17 1206 04/25/17 1615 04/25/17 2051 04/26/17 0730  GLUCAP 120* 166* 195* 150* 145*   Lipid Profile: No results for input(s): CHOL, HDL, LDLCALC, TRIG, CHOLHDL, LDLDIRECT in the last 72 hours. Thyroid Function Tests: No results for input(s): TSH, T4TOTAL, FREET4, T3FREE, THYROIDAB in the last 72 hours. Anemia Panel: No results for input(s): VITAMINB12, FOLATE, FERRITIN, TIBC, IRON, RETICCTPCT in the last 72 hours. Sepsis Labs: No results for input(s): PROCALCITON, LATICACIDVEN in the last 168 hours.  Recent Results (from the past 240 hour(s))  Respiratory Panel by PCR     Status: None   Collection Time: 04/25/17 10:50 AM  Result Value Ref Range Status   Adenovirus NOT DETECTED NOT DETECTED Final   Coronavirus 229E NOT DETECTED NOT DETECTED Final   Coronavirus HKU1 NOT DETECTED NOT DETECTED Final   Coronavirus NL63 NOT DETECTED NOT DETECTED Final   Coronavirus OC43 NOT DETECTED NOT DETECTED Final   Metapneumovirus NOT DETECTED NOT DETECTED Final    Rhinovirus / Enterovirus NOT DETECTED NOT DETECTED Final   Influenza A NOT DETECTED NOT DETECTED Final   Influenza A H1 NOT DETECTED NOT DETECTED Final   Influenza A H1 2009 NOT DETECTED NOT DETECTED Final   Influenza A H3 NOT DETECTED NOT DETECTED Final   Influenza B NOT DETECTED NOT DETECTED Final   Parainfluenza Virus 1 NOT DETECTED NOT DETECTED Final   Parainfluenza Virus 2 NOT DETECTED NOT DETECTED Final   Parainfluenza Virus 3 NOT DETECTED NOT DETECTED Final   Parainfluenza Virus 4 NOT DETECTED NOT DETECTED Final   Respiratory Syncytial Virus NOT DETECTED NOT DETECTED Final   Bordetella pertussis NOT DETECTED NOT DETECTED Final   Chlamydophila pneumoniae NOT DETECTED NOT DETECTED Final   Mycoplasma pneumoniae NOT DETECTED NOT DETECTED Final    Comment: Performed at [facility], [address] [place], [place] [address]         Radiology Studies: No results found.      Scheduled Meds: . carvedilol  12.5 mg Oral BID WC  . colchicine  0.3 mg Oral Daily  .  fluticasone  2 spray Each Nare Daily  . gabapentin  300 mg Oral BID  . insulin aspart  0-15 Units Subcutaneous TID WC  . insulin aspart  0-5 Units Subcutaneous QHS  . insulin glargine  25 Units Subcutaneous QHS  . rivaroxaban  20 mg Oral QHS  . sodium chloride flush  3 mL Intravenous Q12H   Continuous Infusions: . sodium chloride    . amiodarone 30 mg/hr (04/26/17 0354)  . furosemide (LASIX) infusion 12 mg/hr (04/24/17 2313)     LOS: 4 days     Cordelia Poche, MD [facility] 04/26/2017, 10:38 AM Pager: [phone]  If 7PM-7AM, please contact night-coverage [email or web address] Password [facility] 04/26/2017, 10:38 AM

## 2017-04-26 NOTE — Progress Notes (Signed)
PT Cancellation Note  Patient Details Name: MARCH STEYER MRN: [id number] DOB: 1970-05-31   Cancelled Treatment:    Reason Eval/Treat Not Completed: Pain limiting ability to participate, requesting PT check back in with him tomorrow. Will follow-up for PT treatment accordingly.  Mabeline Caras, PT, DPT Acute Rehab Services  Pager: [phone] 04/26/2017, 1:34 PM

## 2017-04-26 NOTE — Care Management Note (Signed)
Case Management Note  Patient Details  Name: John Bailey MRN: [id number] Date of Birth: 1970-12-17  Subjective/Objective:     CHF              Action/Plan: Patient lives at home with spouse; he is a Theme park manager at [employer]; PCP: [name], MD; has private insurance with BCBS with prescription drug coverage; pharmacy of choice is [facility]; DME - cane PRN for Rheumatoid Arthritis. He states that his spouse cooks a low sodium diet and the he does not eat out a lot; has scales and states that he weigh himself; CM encouraged patient to call his physician when his weight starts to increase. Noted visitors/ family members at the bedside very concerned massaging  And rubbing his hands. Patient encouraged to go hand exercises. Family/friends at the bedside states that he is very independent at home. CM will continue to follow for progression of care.  Expected Discharge Date:   possibly 04/30/2017               Expected Discharge Plan:  Home/Self Care  Discharge planning Services  CM Consult  Status of Service:  In process, will continue to follow  Sherrilyn Rist [phone] 04/26/2017, 11:17 AM

## 2017-04-27 ENCOUNTER — Inpatient Hospital Stay (HOSPITAL_COMMUNITY): Payer: BLUE CROSS/BLUE SHIELD

## 2017-04-27 ENCOUNTER — Encounter (HOSPITAL_COMMUNITY): Payer: Self-pay | Admitting: Diagnostic Radiology

## 2017-04-27 HISTORY — PX: IR US GUIDE VASC ACCESS RIGHT: IMG2390

## 2017-04-27 HISTORY — PX: IR FLUORO GUIDE CV LINE RIGHT: IMG2283

## 2017-04-27 LAB — C-REACTIVE PROTEIN: CRP: 34.1 mg/dL — AB (ref <1.0)

## 2017-04-27 LAB — GLUCOSE, CAPILLARY
GLUCOSE-CAPILLARY: 103 mg/dL — AB (ref 65–99)
Glucose-Capillary: 113 mg/dL — ABNORMAL HIGH (ref 65–99)
Glucose-Capillary: 124 mg/dL — ABNORMAL HIGH (ref 65–99)
Glucose-Capillary: 114 mg/dL — ABNORMAL HIGH (ref 65–99)

## 2017-04-27 LAB — BASIC METABOLIC PANEL
ANION GAP: 14 (ref 5–15)
BUN: 63 mg/dL — ABNORMAL HIGH (ref 6–20)
CALCIUM: 8.2 mg/dL — AB (ref 8.9–10.3)
CO2: 21 mmol/L — AB (ref 22–32)
Chloride: 96 mmol/L — ABNORMAL LOW (ref 101–111)
Creatinine, Ser: 3.65 mg/dL — ABNORMAL HIGH (ref 0.61–1.24)
GFR calc non Af Amer: 19 mL/min — ABNORMAL LOW (ref >60)
GFR, EST AFRICAN AMERICAN: 22 mL/min — AB (ref >60)
Glucose, Bld: 115 mg/dL — ABNORMAL HIGH (ref 65–99)
Potassium: 4.8 mmol/L (ref 3.5–5.1)
Sodium: 131 mmol/L — ABNORMAL LOW (ref 135–145)

## 2017-04-27 LAB — CBC
HCT: 29 % — ABNORMAL LOW (ref 39.0–52.0)
Hemoglobin: 9.5 g/dL — ABNORMAL LOW (ref 13.0–17.0)
MCH: 28.2 pg (ref 26.0–34.0)
MCHC: 32.8 g/dL (ref 30.0–36.0)
MCV: 86.1 fL (ref 78.0–100.0)
PLATELETS: 267 10*3/uL (ref 150–400)
RBC: 3.37 MIL/uL — AB (ref 4.22–5.81)
RDW: 17.4 % — ABNORMAL HIGH (ref 11.5–15.5)
WBC: 21.5 10*3/uL — AB (ref 4.0–10.5)

## 2017-04-27 LAB — DIFFERENTIAL
BASOS ABS: 0 10*3/uL (ref 0.0–0.1)
BASOS PCT: 0 %
EOS ABS: 0 10*3/uL (ref 0.0–0.7)
Eosinophils Relative: 0 %
LYMPHS ABS: 1.9 10*3/uL (ref 0.7–4.0)
Lymphocytes Relative: 9 %
MONO ABS: 0.9 10*3/uL (ref 0.1–1.0)
Monocytes Relative: 4 %
NEUTROS ABS: 18.7 10*3/uL — AB (ref 1.7–7.7)
NEUTROS PCT: 87 %

## 2017-04-27 LAB — SEDIMENTATION RATE: SED RATE: 110 mm/h — AB (ref 0–16)

## 2017-04-27 MED ORDER — FUROSEMIDE 10 MG/ML IJ SOLN
160.0000 mg | Freq: Four times a day (QID) | INTRAVENOUS | Status: DC
  Administered 2017-04-27 – 2017-05-02 (×18): 160 mg via INTRAVENOUS
  Filled 2017-04-27 (×17): qty 16
  Filled 2017-04-27: qty 10
  Filled 2017-04-27: qty 16
  Filled 2017-04-27: qty 14
  Filled 2017-04-27: qty 16
  Filled 2017-04-27: qty 10
  Filled 2017-04-27: qty 16

## 2017-04-27 MED ORDER — LIDOCAINE HCL (PF) 1 % IJ SOLN
INTRAMUSCULAR | Status: DC | PRN
  Administered 2017-04-27: 8 mL

## 2017-04-27 MED ORDER — HEPARIN SODIUM (PORCINE) 1000 UNIT/ML IJ SOLN
INTRAMUSCULAR | Status: AC
  Administered 2017-04-27: 2.8 mL
  Filled 2017-04-27: qty 1

## 2017-04-27 MED ORDER — SODIUM CHLORIDE 0.9% FLUSH
10.0000 mL | INTRAVENOUS | Status: DC | PRN
  Administered 2017-04-29: 10 mL
  Administered 2017-05-01: 20 mL
  Administered 2017-05-04: 10 mL
  Filled 2017-04-27 (×3): qty 40

## 2017-04-27 MED ORDER — LIDOCAINE HCL 1 % IJ SOLN
INTRAMUSCULAR | Status: AC
  Filled 2017-04-27: qty 20

## 2017-04-27 MED ORDER — HEPARIN (PORCINE) IN NACL 100-0.45 UNIT/ML-% IJ SOLN
2450.0000 [IU]/h | INTRAMUSCULAR | Status: DC
  Administered 2017-04-27: 1650 [IU]/h via INTRAVENOUS
  Administered 2017-04-28: 1850 [IU]/h via INTRAVENOUS
  Administered 2017-04-29: 2000 [IU]/h via INTRAVENOUS
  Administered 2017-04-29: 2200 [IU]/h via INTRAVENOUS
  Administered 2017-04-30 – 2017-05-03 (×6): 2500 [IU]/h via INTRAVENOUS
  Administered 2017-05-03 – 2017-05-04 (×2): 2450 [IU]/h via INTRAVENOUS
  Filled 2017-04-27 (×16): qty 250

## 2017-04-27 NOTE — Procedures (Signed)
Placement of right jugular non-tunneled dialysis catheter.  Tip in SVC. Minimal blood loss and no immediate complication.

## 2017-04-27 NOTE — Consult Note (Signed)
[facility] Consult Note     Date: 04/27/2017                  Patient Name:  John Bailey  MRN: [id number]  DOB: [birth year]  Age / Sex: 47 y.o., male         PCP: [name], MD                 Service Requesting Consult: [facility] Dr. Lonny Prude                 Reason for Consult: AoCKD             Chief Complaint: edema, hypoxia HPI: John Bailey is a 47 y.o. male with PMH significant for CHF (EF 35% as of 1/19 - home meds are coreg, dilt, lasix 53m PO BID), afib with RVR on xarelto, CKD (baseline Cr 2.5-3 per chart review, although most labs during CHF exacerbations - follows with Dr. GMoshe Cipro, morbid obesity, RA, insulin dependent DMII, and HLD who presents with generalized edema and hypoxia at PCP's office.   Of note, patient held lasix for 1 week prior to admission while on prednisone for RA. Patient estimates 10-15lbs weight gain prior to admission over 2 weeks.  Questions about dry weight (patient reports 260, chart review suggests 304). Initially given lasix 436mIV BID, increased to lasix 80 mg IV BID on 2/9. HF consulted on 2/9, goal weight 285. Cr stable until 2/12, when increased to 3.65. Was on lasix drip 2/9 thru 2/12 am. Nephrotoxic meds include gabpentin, but renally adjusted dose. Now with minimal UOP despite lasix drip and metolazone 2/11.   States he thinks his dry weight is close to 280. Normally at home he is able to do all IADLs. States this swelling came on over the last week. Family at bedside, normal mental status. Unable to lift arms 2/2 swelling.   Past Medical History:  Diagnosis Date  . Atrial flutter with rapid ventricular response (H[facility]one [place]05/23/2017   a. s/p DCCV in 07/2015 with recurrent atrial fibrillation and DCCV in 12/2016. Initially successful but noted to be back in atrial fibrillation within 2 weeks of DCCV.   . Marland Kitchenhronic diastolic (congestive) heart failure (HCC)    a. EF 50-55% by echo in 06/2016.  . Diabetes ([place]CL[place]  .  Hypertension     Past Surgical History:  Procedure Laterality Date  . CARDIOVERSION N/A 08/01/2015   Procedure: CARDIOVERSION;  Surgeon: PeJosue HectorMD;  [facility];  Service: Cardiovascular;  Laterality: N/A;  . CARDIOVERSION N/A 12/18/2016   Procedure: CARDIOVERSION;  Surgeon: RaSkeet LatchMD;  [facility] Service: Cardiovascular;  Laterality: N/A;  . CHOLECYSTECTOMY    . TEE WITHOUT CARDIOVERSION N/A 08/01/2015   Procedure: TRANSESOPHAGEAL ECHOCARDIOGRAM (TEE);  Surgeon: PeJosue HectorMD;  [facility];  Service: Cardiovascular;  Laterality: N/A;    Family History  Problem Relation Age of Onset  . Heart disease Mother   . Hyperlipidemia Father   . Hypertension Father    Social History:  reports that  has never smoked. he has never used smokeless tobacco. He reports that he does not drink alcohol or use drugs.  Allergies: No Known Allergies  Medications Prior to Admission  Medication Sig Dispense Refill  . Abatacept 125 MG/ML SOAJ Inject 125 mg into the skin every 7 (seven) days. On Tuesday    . acetaminophen (TYLENOL) 325 MG tablet Take 650 mg by mouth  every 6 (six) hours as needed for mild pain.    [name] albuterol (PROVENTIL HFA;VENTOLIN HFA) 108 (90 BASE) MCG/ACT inhaler Inhale 1-2 puffs into the lungs every 6 (six) hours as needed for wheezing. 1 Inhaler 3  . carvedilol (COREG) 12.5 MG tablet Take 1 tablet (12.5 mg total) by mouth 2 (two) times daily with a meal. 2 tablet 0  . celecoxib (CELEBREX) 100 MG capsule Take 100 mg by mouth daily.  0  . colchicine 0.6 MG tablet Take 0.5 tablets (0.3 mg total) by mouth daily. 30 tablet 0  . diltiazem (CARDIZEM CD) 180 MG 24 hr capsule Take 1 capsule (180 mg total) by mouth daily. 30 capsule 0  . fluticasone (FLONASE) 50 MCG/ACT nasal spray Place 2 sprays into the nose daily.     . furosemide (LASIX) 40 MG tablet Take 40 mg by mouth 2 (two) times daily.  3  . gabapentin (NEURONTIN) 300 MG capsule  Take 1 capsule (300 mg total) by mouth 2 (two) times daily. (Patient taking differently: Take 300 mg by mouth 3 (three) times daily. ) 60 capsule 0  . insulin aspart (NOVOLOG FLEXPEN) 100 UNIT/ML FlexPen Inject 0-12 Units into the skin 3 (three) times daily as needed for high blood sugar. Sliding Scale <70, initiate hypoglycemia protocol, 70-100=0 units, 110-199= 3 units, 200-250=5 units, 251-300=7 units, 301-350=10 units, 351-400=12 units, >400=14 units. Call MD if >450 15 mL 11  . Insulin Glargine (LANTUS) 100 UNIT/ML Solostar Pen Inject 25 Units into the skin daily at 10 pm. 15 mL 11  . potassium chloride SA (KLOR-CON M20) 20 MEQ tablet Take 1 tablet (20 mEq total) by mouth daily. 90 tablet 3  . rivaroxaban (XARELTO) 20 MG TABS tablet take 1 tablet by mouth once daily WITH SUPPER (Patient taking differently: Take 20 mg by mouth daily with supper. ) 30 tablet 0  . Insulin Pen Needle 31G X 6 MM MISC 1 Device by Does not apply route 2 (two) times daily. 60 each 3    Results for orders placed or performed during the hospital encounter of 04/22/17 (from the past 48 hour(s))  Glucose, capillary     Status: Abnormal   Collection Time: 04/25/17 12:06 PM  Result Value Ref Range   Glucose-Capillary 166 (H) 65 - 99 mg/dL  Basic metabolic panel     Status: Abnormal   Collection Time: 04/25/17  3:21 PM  Result Value Ref Range   Sodium 134 (L) 135 - 145 mmol/L   Potassium 4.1 3.5 - 5.1 mmol/L    Comment: DELTA CHECK NOTED   Chloride 99 (L) 101 - 111 mmol/L   CO2 22 22 - 32 mmol/L   Glucose, Bld 156 (H) 65 - 99 mg/dL   BUN 49 (H) 6 - 20 mg/dL   Creatinine, Ser 2.63 (H) 0.61 - 1.24 mg/dL   Calcium 8.4 (L) 8.9 - 10.3 mg/dL   GFR calc non Af Amer 28 (L) >60 mL/min   GFR calc Af Amer 32 (L) >60 mL/min    Comment: (NOTE) The eGFR has been calculated using the CKD EPI equation. This calculation has not been validated in all clinical situations. eGFR's persistently <60 mL/min signify possible Chronic  Kidney Disease.    Anion gap 13 5 - 15    Comment: Performed at [facility] [address], [place], [place] [address]  Glucose, capillary     Status: Abnormal   Collection Time: 04/25/17  4:15 PM  Result Value Ref Range  Glucose-Capillary 195 (H) 65 - 99 mg/dL  Glucose, capillary     Status: Abnormal   Collection Time: 04/25/17  8:51 PM  Result Value Ref Range   Glucose-Capillary 150 (H) 65 - 99 mg/dL   Comment 1 Notify RN    Comment 2 Document in Chart   Urinalysis, Routine w reflex microscopic     Status: None   Collection Time: 04/25/17  8:57 PM  Result Value Ref Range   Color, Urine YELLOW YELLOW   APPearance CLEAR CLEAR   Specific Gravity, Urine 1.010 1.005 - 1.030   pH 5.0 5.0 - 8.0   Glucose, UA NEGATIVE NEGATIVE mg/dL   Hgb urine dipstick NEGATIVE NEGATIVE   Bilirubin Urine NEGATIVE NEGATIVE   Ketones, ur NEGATIVE NEGATIVE mg/dL   Protein, ur NEGATIVE NEGATIVE mg/dL   Nitrite NEGATIVE NEGATIVE   Leukocytes, UA NEGATIVE NEGATIVE    Comment: Performed at [facility] [address] [place], [place] [address]  Basic metabolic panel     Status: Abnormal   Collection Time: 04/26/17 12:00 AM  Result Value Ref Range   Sodium 134 (L) 135 - 145 mmol/L   Potassium 4.1 3.5 - 5.1 mmol/L   Chloride 100 (L) 101 - 111 mmol/L   CO2 21 (L) 22 - 32 mmol/L   Glucose, Bld 143 (H) 65 - 99 mg/dL   BUN 51 (H) 6 - 20 mg/dL   Creatinine, Ser 2.85 (H) 0.61 - 1.24 mg/dL   Calcium 8.1 (L) 8.9 - 10.3 mg/dL   GFR calc non Af Amer 25 (L) >60 mL/min   GFR calc Af Amer 29 (L) >60 mL/min    Comment: (NOTE) The eGFR has been calculated using the CKD EPI equation. This calculation has not been validated in all clinical situations. eGFR's persistently <60 mL/min signify possible Chronic Kidney Disease.    Anion gap 13 5 - 15    Comment: Performed at [facility] [address], [place], [place] [address]  Culture, blood (routine x 2)     Status: None (Preliminary  result)   Collection Time: 04/26/17 12:10 AM  Result Value Ref Range   Specimen Description BLOOD LEFT HAND    Special Requests IN PEDIATRIC BOTTLE Blood Culture adequate volume    Culture      NO GROWTH 1 DAY Performed at [facility] [address], [place], [place] [address]    Report Status PENDING   Culture, blood (routine x 2)     Status: None (Preliminary result)   Collection Time: 04/26/17 12:18 AM  Result Value Ref Range   Specimen Description BLOOD LEFT HAND    Special Requests IN PEDIATRIC BOTTLE Blood Culture adequate volume    Culture      NO GROWTH 1 DAY Performed at [facility] [address], [place], [place] [address]    Report Status PENDING   Glucose, capillary     Status: Abnormal   Collection Time: 04/26/17  7:30 AM  Result Value Ref Range   Glucose-Capillary 145 (H) 65 - 99 mg/dL   Comment 1 Notify RN   Glucose, capillary     Status: Abnormal   Collection Time: 04/26/17 11:23 AM  Result Value Ref Range   Glucose-Capillary 133 (H) 65 - 99 mg/dL   Comment 1 Notify RN   Glucose, capillary     Status: Abnormal   Collection Time: 04/26/17  4:56 PM  Result Value Ref Range   Glucose-Capillary  135 (H) 65 - 99 mg/dL   Comment 1 Notify RN   Glucose, capillary     Status: Abnormal   Collection Time: 04/26/17  8:53 PM  Result Value Ref Range   Glucose-Capillary 110 (H) 65 - 99 mg/dL   Comment 1 Notify RN    Comment 2 Document in Chart   Basic metabolic panel     Status: Abnormal   Collection Time: 04/27/17  6:40 AM  Result Value Ref Range   Sodium 131 (L) 135 - 145 mmol/L   Potassium 4.8 3.5 - 5.1 mmol/L   Chloride 96 (L) 101 - 111 mmol/L   CO2 21 (L) 22 - 32 mmol/L   Glucose, Bld 115 (H) 65 - 99 mg/dL   BUN 63 (H) 6 - 20 mg/dL   Creatinine, Ser 3.65 (H) 0.61 - 1.24 mg/dL   Calcium 8.2 (L) 8.9 - 10.3 mg/dL   GFR calc non Af Amer 19 (L) >60 mL/min   GFR calc Af Amer 22 (L) >60 mL/min    Comment: (NOTE) The eGFR has been calculated using  the CKD EPI equation. This calculation has not been validated in all clinical situations. eGFR's persistently <60 mL/min signify possible Chronic Kidney Disease.    Anion gap 14 5 - 15    Comment: Performed at [facility] [address] [place], [place] [address]  CBC     Status: Abnormal   Collection Time: 04/27/17  6:40 AM  Result Value Ref Range   WBC 21.5 (H) 4.0 - 10.5 K/uL   RBC 3.37 (L) 4.22 - 5.81 MIL/uL   Hemoglobin 9.5 (L) 13.0 - 17.0 g/dL   HCT 29.0 (L) 39.0 - 52.0 %   MCV 86.1 78.0 - 100.0 fL   MCH 28.2 26.0 - 34.0 pg   MCHC 32.8 30.0 - 36.0 g/dL   RDW 17.4 (H) 11.5 - 15.5 %   Platelets 267 150 - 400 K/uL    Comment: Performed at [facility], [place] [address], [place], [place] [address]  Differential     Status: None (Preliminary result)   Collection Time: 04/27/17  6:40 AM  Result Value Ref Range   Neutrophils Relative % PENDING %   Neutro Abs PENDING 1.7 - 7.7 K/uL   Band Neutrophils PENDING %   Lymphocytes Relative PENDING %   Lymphs Abs PENDING 0.7 - 4.0 K/uL   Monocytes Relative PENDING %   Monocytes Absolute PENDING 0.1 - 1.0 K/uL   Eosinophils Relative PENDING %   Eosinophils Absolute PENDING 0.0 - 0.7 K/uL   Basophils Relative PENDING %   Basophils Absolute PENDING 0.0 - 0.1 K/uL   WBC Morphology PENDING    RBC Morphology PENDING    Smear Review PENDING    nRBC PENDING 0 /100 WBC   Metamyelocytes Relative PENDING %   Myelocytes PENDING %   Promyelocytes Absolute PENDING %   Blasts PENDING %  Glucose, capillary     Status: Abnormal   Collection Time: 04/27/17  7:34 AM  Result Value Ref Range   Glucose-Capillary 113 (H) 65 - 99 mg/dL   Comment 1 Notify RN    Comment 2 Document in Chart    Dg Knee 1-2 Views Left  Result Date: 04/26/2017 CLINICAL DATA:  Rheumatoid arthritis, knee pain, swelling, obesity EXAM: LEFT KNEE - 1-2 VIEW COMPARISON:  03/06/2017 FINDINGS: Left extremity subcutaneous edema noted. No acute osseous finding,  fracture, or malalignment. No effusion. No significant arthropathy or joint  effusion. IMPRESSION: Left lower extremity subcutaneous edema. No acute osseous finding or significant arthropathy by plain radiography Electronically Signed   By: Jerilynn Mages.  Shick M.D.   On: 04/26/2017 12:12   Dg Knee 1-2 Views Right  Result Date: 04/26/2017 CLINICAL DATA:  Rheumatoid arthritis, bilateral knee pain, obesity, swelling EXAM: RIGHT KNEE - 1-2 VIEW COMPARISON:  09/05/2015 FINDINGS: Diffuse subcutaneous edema noted. No acute osseous finding, fracture, malalignment, or effusion. No significant arthropathy. Preserved joint spaces. IMPRESSION: Subcutaneous edema. No acute osseous finding or significant arthropathy by plain radiography Electronically Signed   By: Jerilynn Mages.  Shick M.D.   On: 04/26/2017 12:11    Review of Systems  Constitutional: Positive for malaise/fatigue. Negative for chills, fever and weight loss.  Eyes: Negative for blurred vision.  Respiratory: Negative for cough.   Cardiovascular: Positive for leg swelling and PND. Negative for chest pain and palpitations.  Gastrointestinal: Negative for nausea and vomiting.  Genitourinary: Negative for dysuria, frequency and urgency.  Musculoskeletal: Negative for myalgias.  Skin: Negative for rash.  Neurological: Negative for sensory change and speech change.    Blood pressure 115/74, pulse (!) 120, temperature 99.5 F (37.5 C), temperature source Oral, resp. rate 20, height 6' (1.829 m), weight (!) 385 lb 9.4 oz (174.9 kg), SpO2 93 %. Physical Exam  Constitutional: He appears well-developed. No distress.  HENT:  Head: Normocephalic and atraumatic.  Eyes: EOM are normal. Pupils are equal, round, and reactive to light.  Neck: Normal range of motion.  Unable to appreciate JVD 2/2 body habitus.   Cardiovascular: Normal rate.  Irregular rhythm  Respiratory: Effort normal and breath sounds normal. No respiratory distress.  GI: Soft. Bowel sounds are normal. He  exhibits distension.  Musculoskeletal: Normal range of motion. He exhibits edema.  Massive edema on bilateral UEs and LEs up to sternum.      Assessment/Plan Acute on chronic kidney disease: baseline 2.5-3, but acute increase in Cr over the last 24 hours along with acute decrease in UOP (from 2.3L to 350cc). -place HD access per IR -HD for volume control as soon as access is obtained  -lasix bolus pending HD  CHF: appears massively volume overloaded. Goal weight is 285. Current weight 385 per chart review, increasing during this hospitalization. HF team holding diuresis given kidney insult.   Afib with RVR per cards, planning DCCV when volume is removed and stable.  -anticoag per cards and pharmacy  HTN per primary  Fever with increasing WBCs overnight: question role of RA flare, patient with extremity pain.  -monitor fever curve   Ralene Ok, MD 04/27/17  I have seen and examined this patient and agree with plan and assessment in the above note with renal recommendations/intervention highlighted.  John Bailey is well known to me from previous hospitalizations for the same presentation, however his volume status has worsened despite lasix drip and metolazone.  His AKI/CKD is related to cardiorenal syndrome and agree that he has failed diuretics and will need to proceed with IHD for volume management.  Hopefully his renal function will improve as we improve his dCHF, however his baseline Scr has been climbing following each of his past episodes of AKI/CKD.  Would like to place tunneled catheter, however he is on anticoagulation and has a fever so will ask IR to place a temporary catheter so we can initiate HD tomorrow.  He will also need an AVG/AVF placed later during this hospitalization as his renal function has been declining over the past year.  He is  amenable to proceed with HD after HD catheter placement.  In the meantime will try high dose boluses of lasix to help with diuresis.    Governor Rooks Ranay Ketter,MD 04/27/2017 3:38 PM

## 2017-04-27 NOTE — Progress Notes (Signed)
Patient ID: John Bailey, male   DOB: [birth year], 47 y.o.   MRN: [id number]     Advanced Heart Failure Rounding Note  PCP-Cardiologist: Pixie Casino, MD  HF Cardiologist: [name], MD  Subjective:    Yesterday, Lasix increased to 15 mg/hr and he got 5 mg po x 1 metolazone.  Minimal UOP recorded and creatinine up to 3.65.  He is unable to bear weight still due to right knee pain.   Echo (04/25/17): EF 55-60%, severe LVH.   Objective:   Weight Range: (!) 385 lb 9.4 oz (174.9 kg) Body mass index is 52.29 kg/m.   Vital Signs:   Temp:  [99.5 F (37.5 C)-101.2 F (38.4 C)] 99.5 F (37.5 C) (02/12 0844) Pulse Rate:  [72-120] 120 (02/12 0844) Resp:  [18-20] 20 (02/12 0844) BP: (106-126)/(53-76) 115/74 (02/12 0844) SpO2:  [93 %-100 %] 93 % (02/12 0844) Weight:  [385 lb 9.4 oz (174.9 kg)] 385 lb 9.4 oz (174.9 kg) (02/12 0545) Last BM Date: 04/25/17  Weight change: Filed Weights   04/25/17 1329 04/26/17 0513 04/27/17 0545  Weight: (!) 385 lb 12.9 oz (175 kg) (!) 387 lb 5.6 oz (175.7 kg) (!) 385 lb 9.4 oz (174.9 kg)    Intake/Output:   Intake/Output Summary (Last 24 hours) at 04/27/2017 0921 Last data filed at 04/27/2017 0600 Gross per 24 hour  Intake 727.45 ml  Output 350 ml  Net 377.45 ml      Physical Exam    General: NAD, obese.  Neck: Thick, JVP 14+, no thyromegaly or thyroid nodule.  Lungs: Decreased breath sounds at bases.  CV: Nonpalpable PMI.  Heart irregular S1/S2, no S3/S4, no murmur.  2+ edema up legs into abdomen.   Abdomen: Soft, nontender, no hepatosplenomegaly, mild distention.  Skin: Intact without lesions or rashes.  Neurologic: Alert and oriented x 3.  Psych: Normal affect. Extremities: No clubbing or cyanosis.  HEENT: Normal.    Telemetry   Atrial fibrillation in 70s, personally reviewed   Labs    CBC Recent Labs    04/27/17 0640  WBC 21.5*  HGB 9.5*  HCT 29.0*  MCV 86.1  PLT 790   Basic Metabolic Panel Recent Labs   04/26/17 0000 04/27/17 0640  NA 134* 131*  K 4.1 4.8  CL 100* 96*  CO2 21* 21*  GLUCOSE 143* 115*  BUN 51* 63*  CREATININE 2.85* 3.65*  CALCIUM 8.1* 8.2*   Liver Function Tests No results for input(s): AST, ALT, ALKPHOS, BILITOT, PROT, ALBUMIN in the last 72 hours. No results for input(s): LIPASE, AMYLASE in the last 72 hours. Cardiac Enzymes No results for input(s): CKTOTAL, CKMB, CKMBINDEX, TROPONINI in the last 72 hours.  BNP: BNP (last 3 results) Recent Labs    03/06/17 1800 04/22/17 1734 04/23/17 0736  BNP 791.5* 531.3* 627.9*    ProBNP (last 3 results) No results for input(s): PROBNP in the last 8760 hours.   D-Dimer No results for input(s): DDIMER in the last 72 hours. Hemoglobin A1C No results for input(s): HGBA1C in the last 72 hours. Fasting Lipid Panel No results for input(s): CHOL, HDL, LDLCALC, TRIG, CHOLHDL, LDLDIRECT in the last 72 hours. Thyroid Function Tests No results for input(s): TSH, T4TOTAL, T3FREE, THYROIDAB in the last 72 hours.  Invalid input(s): FREET3  Other results:   Imaging    Dg Knee 1-2 Views Left  Result Date: 04/26/2017 CLINICAL DATA:  Rheumatoid arthritis, knee pain, swelling, obesity EXAM: LEFT KNEE - 1-2 VIEW COMPARISON:  03/06/2017 FINDINGS: Left extremity subcutaneous edema noted. No acute osseous finding, fracture, or malalignment. No effusion. No significant arthropathy or joint effusion. IMPRESSION: Left lower extremity subcutaneous edema. No acute osseous finding or significant arthropathy by plain radiography Electronically Signed   By: Jerilynn Mages.  Shick M.D.   On: 04/26/2017 12:12   Dg Knee 1-2 Views Right  Result Date: 04/26/2017 CLINICAL DATA:  Rheumatoid arthritis, bilateral knee pain, obesity, swelling EXAM: RIGHT KNEE - 1-2 VIEW COMPARISON:  09/05/2015 FINDINGS: Diffuse subcutaneous edema noted. No acute osseous finding, fracture, malalignment, or effusion. No significant arthropathy. Preserved joint spaces. IMPRESSION:  Subcutaneous edema. No acute osseous finding or significant arthropathy by plain radiography Electronically Signed   By: Jerilynn Mages.  Shick M.D.   On: 04/26/2017 12:11     Medications:     Scheduled Medications: . carvedilol  12.5 mg Oral BID WC  . colchicine  0.3 mg Oral Daily  . fluticasone  2 spray Each Nare Daily  . gabapentin  300 mg Oral BID  . insulin aspart  0-15 Units Subcutaneous TID WC  . insulin aspart  0-5 Units Subcutaneous QHS  . insulin glargine  25 Units Subcutaneous QHS  . rivaroxaban  20 mg Oral QHS  . sodium chloride flush  3 mL Intravenous Q12H    Infusions: . sodium chloride    . amiodarone 30 mg/hr (04/27/17 0251)    PRN Medications: sodium chloride, acetaminophen, methocarbamol, ondansetron (ZOFRAN) IV, sodium chloride flush, sodium chloride flush    Assessment/Plan   1. Atrial fibrillation with RVR: On review of prior ECGs, last time he was documented to be in NSR was in 1/18. I suspect that he may be better compensated in NSR.  However, will need to diurese some before attempted to cardiovert him. HR better controlled on amiodarone.  - Continue Xarelto.  He says that he has not missed doses.  - Continue Coreg 12.5 mg BID - Continue IV amiodarone 30 mg/hr for rate control. May consider atrial fibrillation ablation in the future, although body habitus will likely make this difficult. Ideally, would not continue amiodarone long term.   - Plan for DCCV after fluid status improves (no need for TEE as he has not missed any Xarelto lately).  2. Acute on chronic diastolic CHF: Echo this admission with severe LVH, EF 55-60%. Marked volume overload with anasarca.  Minimal diuresis with Lasix gtt @ 15 mg/hr + metolazone and creatinine up to 3.65.  - Stop Lasix and metolazone at this point as they are ineffective.  - Will ask nephrology to see.  With his degree of volume overload, I think he will require at least temporary HD for fluid removal. Discussed with patient and  family.  3. AKI on CKD Stage IV:  Worsening creatinine with poor UOP.  See discussion above regarding at least temporary HD.  4. Fever: Tm 101.2 overnight.  No PNA on CXR, no UA on UTI. Has right knee pain but think infection probably unlikely. WBCs up to 21. Blood cultures NGTD.  5. HTN: BP controlled.  6. Knee pain:  Likely related to rheumatoid arthritis.   Length of Stay: Perry, MD  04/27/2017, 9:21 AM  Advanced Heart Failure Team Pager [phone] (M-F; 7a - 4p)  Please contact [facility] for night-coverage after hours (4p -7a ) and weekends on [email or web address]

## 2017-04-27 NOTE — Progress Notes (Signed)
Physical Therapy Treatment Patient Details Name: John Bailey MRN: [id number] DOB: [birth year] Today's Date: 04/27/2017    History of Present Illness John Bailey is a 47 y.o. rheumatoid arthritis, diabetes mellitus, heart failure, atrial fibrillation. Patient presented with dyspnea and found to have a CHF exacerbation    PT Comments    Pt presents with functional decline due to fluid overload and immobility as his CHF exacerbation continues to limit his function.  Informed multiple MDs to order this Vital Go tilt bed for therapy to improve patient's strength and function in his current state. Plan next session to train staff on tilt therapies.    Follow Up Recommendations  SNF;Supervision/Assistance - 24 hour     Equipment Recommendations  (TBD at next venue)    Recommendations for Other Services       Precautions / Restrictions Precautions Precautions: Fall Restrictions Weight Bearing Restrictions: No    Mobility  Bed Mobility Overal bed mobility: Needs Assistance Bed Mobility: Supine to Sit;Sit to Supine     Supine to sit: Total assist;+2 for physical assistance;+2 for safety/equipment Sit to supine: Total assist;+2 for physical assistance;+2 for safety/equipment   General bed mobility comments: Pt required +3 assistance for supine to sit via helicopter transfer, due to fluid overload patient is limited in his abilities to flex B hips and knees to sit functionally edge of bed.  he required total assist +3 and sat edge of bed x 15 sec before requiring return to supine as he was not posturing safe at edge of bed.  Pt required total assistance for LE advancement and trunk elevation.  His grip strength is poor which continues to limits his ability to assist with functional mobility.   Transfers                 General transfer comment: NT patient is not safe awaiting order for vital go bed and decreased edema to progress mobility.    Ambulation/Gait                  Stairs            Wheelchair Mobility    Modified Rankin (Stroke Patients Only)       Balance Overall balance assessment: Needs assistance   Sitting balance-Leahy Scale: Zero Sitting balance - Comments: Pt unable to sit edge of bed without external assistance.                                      Cognition Arousal/Alertness: Awake/alert Behavior During Therapy: WFL for tasks assessed/performed Overall Cognitive Status: Within Functional Limits for tasks assessed                                        Exercises      General Comments        Pertinent Vitals/Pain Pain Assessment: 0-10 Pain Score: 10-Worst pain ever Pain Location: generalized due to increased edema and pain when touching patient to assist in mobilization Pain Descriptors / Indicators: Discomfort;Constant;Grimacing Pain Intervention(s): Monitored during session;Repositioned    Home Living                      Prior Function            PT Goals (current goals can now be  found in the care plan section) Acute Rehab PT Goals Patient Stated Goal: Get the swelling down Potential to Achieve Goals: Fair Progress towards PT goals: Progressing toward goals    Frequency    Min 3X/week      PT Plan Current plan remains appropriate    Co-evaluation              AM-PAC PT "6 Clicks" Daily Activity  Outcome Measure  Difficulty turning over in bed (including adjusting bedclothes, sheets and blankets)?: Unable Difficulty moving from lying on back to sitting on the side of the bed? : Unable Difficulty sitting down on and standing up from a chair with arms (e.g., wheelchair, bedside commode, etc,.)?: Unable Help needed moving to and from a bed to chair (including a wheelchair)?: Total Help needed walking in hospital room?: Total Help needed climbing 3-5 steps with a railing? : Total 6 Click Score: 6    End of Session Equipment Utilized  During Treatment: Gait belt Activity Tolerance: Patient limited by fatigue Patient left: in bed;with call bell/phone within reach;with bed alarm set Nurse Communication: Mobility status       Time: 1431-1451 PT Time Calculation (min) (ACUTE ONLY): 20 min  Charges:  $Therapeutic Activity: 8-22 mins                    G CodesGovernor Bailey, PTA pager [phone]    John Bailey 04/27/2017, 6:16 PM

## 2017-04-27 NOTE — Progress Notes (Addendum)
ANTICOAGULATION CONSULT NOTE - Initial Consult  Pharmacy Consult for heparin infusion Indication: atrial fibrillation  No Known Allergies  Patient Measurements: Height: 6' (182.9 cm) Weight: (!) 385 lb 9.4 oz (174.9 kg) IBW/kg (Calculated) : 77.6 Heparin Dosing Weight: 119.5 kg  Vital Signs: Temp: 99.5 F (37.5 C) (02/12 0844) Temp Source: Oral (02/12 0844) BP: 115/74 (02/12 0844) Pulse Rate: 120 (02/12 0844)  Labs: Recent Labs    04/25/17 1521 04/26/17 0000 04/27/17 0640  HGB  --   --  9.5*  HCT  --   --  29.0*  PLT  --   --  267  CREATININE 2.63* 2.85* 3.65*    Estimated Creatinine Clearance: 42.6 mL/min (A) (by C-G formula based on SCr of 3.65 mg/dL (H)).   Medical History: Past Medical History:  Diagnosis Date  . Atrial flutter with rapid ventricular response ([place]) 08/06/2015   a. s/p DCCV in 07/2015 with recurrent atrial fibrillation and DCCV in 12/2016. Initially successful but noted to be back in atrial fibrillation within 2 weeks of DCCV.   [name] Chronic diastolic (congestive) heart failure (HCC)    a. EF 50-55% by echo in 06/2016.  . Diabetes ([place])   . Hypertension     Medications:  Scheduled:  . carvedilol  12.5 mg Oral BID WC  . colchicine  0.3 mg Oral Daily  . fluticasone  2 spray Each Nare Daily  . gabapentin  300 mg Oral BID  . insulin aspart  0-15 Units Subcutaneous TID WC  . insulin aspart  0-5 Units Subcutaneous QHS  . insulin glargine  25 Units Subcutaneous QHS  . sodium chloride flush  3 mL Intravenous Q12H    Assessment: 46 yom on Xarelto PTA for Afib. Patient has marked volume overload with increasing Scr and decreasing urine output, despite lasix infusion and metolazone doses. Scr increased  From 2.85 to 3.65 overnight. Normalized CrCl is ~25 mL/min, with TBW CrCl ~65 mL/min. Due to differences in CrCl based on weight, increase in Scr overnight, and since Xarelto was not extensively studied in patients weighing>125 kg, plan to transition to  heparin infusion due AKI period.   Last dose of Xarelto was on 2/11 at 2154. Given recent Xarelto exposure, will evaluate both aPTT and heparin levels until correlate.   Goal of Therapy:  APTT level: 66-102 s Heparin level 0.3-0.7 units/ml Monitor platelets by anticoagulation protocol: Yes   Plan:  No bolus given recent Xarelto administration Start heparin infusion at 1650 units/hr tonight at 2200 Check anti-Xa level in 8 hours and daily while on heparin Continue to monitor H&H and platelets  Doylene Canard, PharmD Clinical Pharmacist  Pager: [phone] Clinical Phone for 04/27/2017 until 3:30pm: [phone] If after 3:30pm, please call main pharmacy at [phone] 04/27/2017,11:46 AM

## 2017-04-27 NOTE — Progress Notes (Addendum)
PROGRESS NOTE    John Bailey  OBS:[id number] DOB: June 11, 1970 DOA: 04/22/2017 PCP: [name], MD   Brief Narrative: John Bailey is a 47 y.o. rheumatoid arthritis, diabetes mellitus, heart failure, atrial fibrillation. Patient presented with dyspnea and found to have a CHF exacerbation. Diuresed with worsening kidney function. Fever and new leukocytosis. Infectious workup so far is unremarkable.   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 Per chart review, he was 341 lbs three weeks ago and 304 lbs just over one month ago. UOP of 1.87 L over last 24 hours. Weight unchanged in setting of IV diuresis. Echocardiogram significant for an EF of 55-60% and severe LVH with dilated left atrium and trivial pericardial effusion; unable to assess diastolic dysfunction -Cardiology recommendations: holding diuresis in setting of poor response and worsening renal function -Daily weight/strict in and out  Anasarca Secondary to fluid overload. -management above  Rheumatoid arthritis Patient on Abatacept every Tuesday. Knee x-rays negative for knee effusion -Hold abatacept in setting of possible infection -Bilateral knee x-ray  Atrial fibrillation with RVR Rate controlled. May need to reconsider Xarelto in setting of CKD IV -On amiodarone per cardiology. Plan for DCCV at some point  CKD Stage IV Stable.  Hyperkalemia Likely erroneous. Resolved.  SIRS Unknown etiology. Some tachycardia in  setting of atrial fibrillation. Otherwise, hemodynamically stable. Infectious workup so far has been negative. Patient with history of RA and possible this could be a flair. Kidney function worsening and would not start NSAIDs. Patient declining steroids if in fact infectious source with -Repeat Urinalysis and obtain urine culture in setting of AKI. -blood cultures pending   DVT prophylaxis: Xarelto Code Status: Full code Family Communication: None at bedside Disposition Plan: Likely home when medically stable (patient refusing SNF)   Consultants:   Cardiology  Procedures:   None  Antimicrobials:  None    Subjective: No dyspnea or chest pain. Knee pain.  Objective: Vitals:   04/26/17 2333 04/27/17 0015 04/27/17 0545 04/27/17 0844  BP:  (!) 117/53 126/73 115/74  Pulse:  72 (!) 107 (!) 120  Resp:  18 20 20   Temp: 100.2 F (37.9 C) (!) 100.5 F (38.1 C) 99.7 F (37.6 C) 99.5 F (37.5 C)  TempSrc: Oral Oral Oral Oral  SpO2:  100% 100% 93%  Weight:   (!) 174.9 kg (385 lb 9.4 oz)   Height:        Intake/Output Summary (Last 24 hours) at 04/27/2017 1040 Last data filed at 04/27/2017 0600 Gross per 24 hour  Intake 461.35 ml  Output 350 ml  Net 111.35 ml   Filed Weights   04/25/17 1329 04/26/17 0513 04/27/17 0545  Weight: (!) 175 kg (385 lb 12.9 oz) (!) 175.7 kg (387 lb 5.6 oz) (!) 174.9 kg (385 lb 9.4 oz)    Examination:  General exam: Appears calm and comfortable Respiratory system: Diminished breath sounds. Cardiovascular system: S1 & S2 heard, irregular rhyrhm, normal heart rate. No murmurs, rubs, gallops or clicks. 2+ edema of arms,  hands, abdomen and legs Gastrointestinal system: Soft, obese, bowel sounds. Central nervous system: Alert and oriented. No focal neurological deficits. Extremities: No calf tenderness. Bilateral Unna boots Skin: No cyanosis. No rashes Psychiatry: Judgement and insight appear normal. Mood & affect appropriate.     Data  Reviewed: I have personally reviewed following labs and imaging studies  CBC: Recent Labs  Lab 04/22/17 1733 04/23/17 0736 04/27/17 0640  WBC 8.8 8.4 21.5*  NEUTROABS 5.7 5.5  --   HGB 10.1* 10.1* 9.5*  HCT 32.4* 32.8* 29.0*  MCV 91.5 91.1 86.1  PLT 344 364 086   Basic Metabolic Panel: Recent Labs  Lab 04/24/17 0449 04/25/17 0546 04/25/17 1521 04/26/17 0000 04/27/17 0640  NA 137 136 134* 134* 131*  K 4.6 6.0* 4.1 4.1 4.8  CL 103 102 99* 100* 96*  CO2 23 19* 22 21* 21*  GLUCOSE 165* 126* 156* 143* 115*  BUN 47* 51* 49* 51* 63*  CREATININE 2.33* 2.59* 2.63* 2.85* 3.65*  CALCIUM 8.7* 8.5* 8.4* 8.1* 8.2*   GFR: Estimated Creatinine Clearance: 42.6 mL/min (A) (by C-G formula based on SCr of 3.65 mg/dL (H)). Liver Function Tests: Recent Labs  Lab 04/22/17 1733  AST 12*  ALT 10*  ALKPHOS 101  BILITOT 0.6  PROT 5.8*  ALBUMIN 2.8*   No results for input(s): LIPASE, AMYLASE in the last 168 hours. No results for input(s): AMMONIA in the last 168 hours. Coagulation Profile: No results for input(s): INR, PROTIME in the last 168 hours. Cardiac Enzymes: No results for input(s): CKTOTAL, CKMB, CKMBINDEX, TROPONINI in the last 168 hours. BNP (last 3 results) No results for input(s): PROBNP in the last 8760 hours. HbA1C: No results for input(s): HGBA1C in the last 72 hours. CBG: Recent Labs  Lab 04/26/17 0730 04/26/17 1123 04/26/17 1656 04/26/17 2053 04/27/17 0734  GLUCAP 145* 133* 135* 110* 113*   Lipid Profile: No results for input(s): CHOL, HDL, LDLCALC, TRIG, CHOLHDL, LDLDIRECT in the last 72 hours. Thyroid Function Tests: No results for input(s): TSH, T4TOTAL, FREET4, T3FREE, THYROIDAB in the last 72 hours. Anemia Panel: No results for input(s): VITAMINB12, FOLATE, FERRITIN, TIBC, IRON, RETICCTPCT in the last 72 hours. Sepsis Labs: No results for input(s): PROCALCITON, LATICACIDVEN in the last 168 hours.  Recent Results (from the past 240 hour(s))    Respiratory Panel by PCR     Status: None   Collection Time: 04/25/17 10:50 AM  Result Value Ref Range Status   Adenovirus NOT DETECTED NOT DETECTED Final   Coronavirus 229E NOT DETECTED NOT DETECTED Final   Coronavirus HKU1 NOT DETECTED NOT DETECTED Final   Coronavirus NL63 NOT DETECTED NOT DETECTED Final   Coronavirus OC43 NOT DETECTED NOT DETECTED Final   Metapneumovirus NOT DETECTED NOT DETECTED Final   Rhinovirus / Enterovirus NOT DETECTED NOT DETECTED Final   Influenza A NOT DETECTED NOT DETECTED Final   Influenza A H1 NOT DETECTED NOT DETECTED Final   Influenza A H1 2009 NOT DETECTED NOT DETECTED Final   Influenza A H3 NOT DETECTED NOT DETECTED Final   Influenza B NOT DETECTED NOT DETECTED Final   Parainfluenza Virus 1 NOT DETECTED NOT DETECTED Final   Parainfluenza Virus 2 NOT DETECTED NOT DETECTED Final   Parainfluenza Virus 3 NOT DETECTED NOT DETECTED Final   Parainfluenza Virus 4 NOT DETECTED NOT DETECTED Final   Respiratory Syncytial Virus NOT DETECTED NOT DETECTED Final   Bordetella pertussis NOT DETECTED NOT DETECTED Final   Chlamydophila pneumoniae NOT DETECTED NOT DETECTED Final  Mycoplasma pneumoniae NOT DETECTED NOT DETECTED Final    Comment: Performed at [facility], [place] [address] [place], [place] [address]  Culture, blood (routine x 2)     Status: None (Preliminary result)   Collection Time: 04/26/17 12:10 AM  Result Value Ref Range Status   Specimen Description BLOOD LEFT HAND  Final   Special Requests IN PEDIATRIC BOTTLE Blood Culture adequate volume  Final   Culture   Final    NO GROWTH 1 DAY Performed at [facility], [place] [address], [place], [place] [address]    Report Status PENDING  Incomplete  Culture, blood (routine x 2)     Status: None (Preliminary result)   Collection Time: 04/26/17 12:18 AM  Result Value Ref Range Status   Specimen Description BLOOD LEFT HAND  Final   Special Requests IN PEDIATRIC BOTTLE Blood Culture adequate  volume  Final   Culture   Final    NO GROWTH 1 DAY Performed at [facility], [place] [address], [place], [place] [address]    Report Status PENDING  Incomplete         Radiology Studies: Dg Knee 1-2 Views Left  Result Date: 04/26/2017 CLINICAL DATA:  Rheumatoid arthritis, knee pain, swelling, obesity EXAM: LEFT KNEE - 1-2 VIEW COMPARISON:  03/06/2017 FINDINGS: Left extremity subcutaneous edema noted. No acute osseous finding, fracture, or malalignment. No effusion. No significant arthropathy or joint effusion. IMPRESSION: Left lower extremity subcutaneous edema. No acute osseous finding or significant arthropathy by plain radiography Electronically Signed   By: Jerilynn Mages.  Shick M.D.   On: 04/26/2017 12:12   Dg Knee 1-2 Views Right  Result Date: 04/26/2017 CLINICAL DATA:  Rheumatoid arthritis, bilateral knee pain, obesity, swelling EXAM: RIGHT KNEE - 1-2 VIEW COMPARISON:  09/05/2015 FINDINGS: Diffuse subcutaneous edema noted. No acute osseous finding, fracture, malalignment, or effusion. No significant arthropathy. Preserved joint spaces. IMPRESSION: Subcutaneous edema. No acute osseous finding or significant arthropathy by plain radiography Electronically Signed   By: Jerilynn Mages.  Shick M.D.   On: 04/26/2017 12:11        Scheduled Meds: . carvedilol  12.5 mg Oral BID WC  . colchicine  0.3 mg Oral Daily  . fluticasone  2 spray Each Nare Daily  . gabapentin  300 mg Oral BID  . insulin aspart  0-15 Units Subcutaneous TID WC  . insulin aspart  0-5 Units Subcutaneous QHS  . insulin glargine  25 Units Subcutaneous QHS  . rivaroxaban  20 mg Oral QHS  . sodium chloride flush  3 mL Intravenous Q12H   Continuous Infusions: . sodium chloride    . amiodarone 30 mg/hr (04/27/17 0251)     LOS: 5 days     Cordelia Poche, MD [facility] 04/27/2017, 10:40 AM Pager: [phone]  If 7PM-7AM, please contact night-coverage [email or web address] Password [email or web address] 04/27/2017, 10:40 AM

## 2017-04-28 ENCOUNTER — Inpatient Hospital Stay (HOSPITAL_COMMUNITY): Payer: BLUE CROSS/BLUE SHIELD

## 2017-04-28 DIAGNOSIS — M0579 Rheumatoid arthritis with rheumatoid factor of multiple sites without organ or systems involvement: Secondary | ICD-10-CM

## 2017-04-28 DIAGNOSIS — Z794 Long term (current) use of insulin: Secondary | ICD-10-CM

## 2017-04-28 DIAGNOSIS — I110 Hypertensive heart disease with heart failure: Secondary | ICD-10-CM

## 2017-04-28 DIAGNOSIS — M25561 Pain in right knee: Secondary | ICD-10-CM

## 2017-04-28 DIAGNOSIS — N186 End stage renal disease: Secondary | ICD-10-CM

## 2017-04-28 DIAGNOSIS — M25562 Pain in left knee: Secondary | ICD-10-CM

## 2017-04-28 DIAGNOSIS — E1129 Type 2 diabetes mellitus with other diabetic kidney complication: Secondary | ICD-10-CM

## 2017-04-28 LAB — APTT
aPTT: 58 seconds — ABNORMAL HIGH (ref 24–36)
aPTT: 62 seconds — ABNORMAL HIGH (ref 24–36)

## 2017-04-28 LAB — BASIC METABOLIC PANEL
ANION GAP: 17 — AB (ref 5–15)
BUN: 74 mg/dL — ABNORMAL HIGH (ref 6–20)
CO2: 18 mmol/L — ABNORMAL LOW (ref 22–32)
Calcium: 8.3 mg/dL — ABNORMAL LOW (ref 8.9–10.3)
Chloride: 95 mmol/L — ABNORMAL LOW (ref 101–111)
Creatinine, Ser: 4.09 mg/dL — ABNORMAL HIGH (ref 0.61–1.24)
GFR, EST AFRICAN AMERICAN: 19 mL/min — AB (ref >60)
GFR, EST NON AFRICAN AMERICAN: 16 mL/min — AB (ref >60)
Glucose, Bld: 91 mg/dL (ref 65–99)
POTASSIUM: 5 mmol/L (ref 3.5–5.1)
SODIUM: 130 mmol/L — AB (ref 135–145)

## 2017-04-28 LAB — GLUCOSE, CAPILLARY
GLUCOSE-CAPILLARY: 97 mg/dL (ref 65–99)
GLUCOSE-CAPILLARY: 104 mg/dL — AB (ref 65–99)
GLUCOSE-CAPILLARY: 106 mg/dL — AB (ref 65–99)
Glucose-Capillary: 113 mg/dL — ABNORMAL HIGH (ref 65–99)

## 2017-04-28 LAB — CBC WITH DIFFERENTIAL/PLATELET
BASOS ABS: 0 10*3/uL (ref 0.0–0.1)
Basophils Relative: 0 %
Eosinophils Absolute: 0 10*3/uL (ref 0.0–0.7)
Eosinophils Relative: 0 %
HEMATOCRIT: 30.9 % — AB (ref 39.0–52.0)
HEMOGLOBIN: 10.5 g/dL — AB (ref 13.0–17.0)
LYMPHS ABS: 1.9 10*3/uL (ref 0.7–4.0)
LYMPHS PCT: 9 %
MCH: 28.9 pg (ref 26.0–34.0)
MCHC: 34 g/dL (ref 30.0–36.0)
MCV: 85.1 fL (ref 78.0–100.0)
MONOS PCT: 4 %
Monocytes Absolute: 0.9 10*3/uL (ref 0.1–1.0)
Neutro Abs: 18.8 10*3/uL — ABNORMAL HIGH (ref 1.7–7.7)
Neutrophils Relative %: 87 %
Platelets: 334 10*3/uL (ref 150–400)
RBC: 3.63 MIL/uL — AB (ref 4.22–5.81)
RDW: 17.2 % — AB (ref 11.5–15.5)
WBC: 21.6 10*3/uL — AB (ref 4.0–10.5)

## 2017-04-28 LAB — HEPARIN LEVEL (UNFRACTIONATED): Heparin Unfractionated: >2.2 IU/mL — ABNORMAL HIGH (ref 0.30–0.70)

## 2017-04-28 LAB — HEPATITIS B SURFACE ANTIGEN: HEP B S AG: NEGATIVE

## 2017-04-28 MED ORDER — CARVEDILOL 6.25 MG PO TABS
18.7500 mg | ORAL_TABLET | Freq: Two times a day (BID) | ORAL | Status: DC
  Administered 2017-04-29 – 2017-05-13 (×20): 18.75 mg via ORAL
  Filled 2017-04-28 (×21): qty 1

## 2017-04-28 MED ORDER — AMIODARONE HCL 200 MG PO TABS
200.0000 mg | ORAL_TABLET | Freq: Two times a day (BID) | ORAL | Status: DC
  Administered 2017-04-28: 200 mg via ORAL
  Filled 2017-04-28: qty 1

## 2017-04-28 NOTE — Progress Notes (Signed)
ANTICOAGULATION CONSULT NOTE - Follow Up Consult  Pharmacy Consult for heparin infusion Indication: atrial fibrillation  No Known Allergies  Patient Measurements: Height: 6' (182.9 cm) Weight: (!) 388 lb 7.2 oz (176.2 kg) IBW/kg (Calculated) : 77.6 Heparin Dosing Weight: 119.5 kg  Vital Signs: Temp: 99.8 F (37.7 C) (02/13 0300) Temp Source: Oral (02/13 0300) BP: 158/88 (02/13 0300) Pulse Rate: 128 (02/13 0300)  Labs: Recent Labs    04/26/17 0000 04/27/17 0640 04/28/17 0532  HGB  --  9.5* 10.5*  HCT  --  29.0* 30.9*  PLT  --  267 334  APTT  --   --  58*  HEPARINUNFRC  --   --  >2.20*  CREATININE 2.85* 3.65* 4.09*    Estimated Creatinine Clearance: 38.1 mL/min (A) (by C-G formula based on SCr of 4.09 mg/dL (H)).   Medical History: Past Medical History:  Diagnosis Date  . Atrial flutter with rapid ventricular response ([place]) 08/06/2015   a. s/p DCCV in 07/2015 with recurrent atrial fibrillation and DCCV in 12/2016. Initially successful but noted to be back in atrial fibrillation within 2 weeks of DCCV.   [name] Chronic diastolic (congestive) heart failure (HCC)    a. EF 50-55% by echo in 06/2016.  . Diabetes ([place])   . Hypertension     Medications:  Scheduled:  . carvedilol  12.5 mg Oral BID WC  . colchicine  0.3 mg Oral Daily  . fluticasone  2 spray Each Nare Daily  . gabapentin  300 mg Oral BID  . insulin aspart  0-15 Units Subcutaneous TID WC  . insulin aspart  0-5 Units Subcutaneous QHS  . insulin glargine  25 Units Subcutaneous QHS  . sodium chloride flush  3 mL Intravenous Q12H    Assessment: 46 yom on Xarelto PTA for Afib. Patient has marked volume overload with increasing Scr and decreasing urine output, despite lasix infusion and metolazone doses. Scr increased from 3.65 to 4.09.   Last dose of Xarelto was on 2/11 at 2154. Given recent Xarelto exposure, will evaluate both aPTT and heparin levels until correlate.   Heparin level is falsely elevated at  >2.20 (likely due to recent Xarelto administration). aPPT slightly subtherapeutic at 58, on 1650 units/hr. Hgb increased to 10.5, platelets at 334. No signs/symptoms of bleeding. No infusion issues.  Goal of Therapy:  APTT level: 66-102 s Heparin level 0.3-0.7 units/ml Monitor platelets by anticoagulation protocol: Yes   Plan:  Increase heparin infusion to 1850 units/hr Check anti-Xa level in 8 hours and daily while on heparin Continue to monitor H&H and platelets  Doylene Canard, PharmD Clinical Pharmacist  Pager: [phone] Clinical Phone for 04/28/2017 until 3:30pm: [phone] If after 3:30pm, please call main pharmacy at [phone] 04/28/2017,8:03 AM

## 2017-04-28 NOTE — Progress Notes (Signed)
Physical Therapy Treatment Patient Details Name: John Bailey MRN: [id number] DOB: [birth year] Today's Date: 04/28/2017    History of Present Illness John Bailey is a 47 y.o. rheumatoid arthritis, diabetes mellitus, heart failure, atrial fibrillation. Patient presented with dyspnea and found to have a CHF exacerbation    PT Comments    Pt performed transfer from floor bed to vital Go tilt bed via maximove .  Pt required rolling to R and L to achieve placement of pad for transfer. Pt tolerated 10 min tilt at 40 degrees to achieve weight bearing based on dependent presentation.  Staff educated on use of bed for safety with future tilts. Plan next session for increasing tilt angle and performing LE strengthening in supine.   Follow Up Recommendations  SNF;Supervision/Assistance - 24 hour     Equipment Recommendations  (TBD at next venuw)    Recommendations for Other Services OT consult     Precautions / Restrictions Precautions Precautions: Fall Restrictions Weight Bearing Restrictions: No    Mobility  Bed Mobility Overal bed mobility: Needs Assistance   Rolling: Max assist;+2 for physical assistance(for placement of maximove pad and removing pad after transfer to tilt bed.  Pt performed x2 R and x2 L.  )         General bed mobility comments: Pt performed rolling with max +3 able to reach across body but remains unable to grip to assist in pulling.  Pt performed trunk elevation from reclined chair position with max +2 to assist in trunk elevation and maintain posture.    Transfers Overall transfer level: Needs assistance               General transfer comment: +3 maximove to transfer from floor bed to vital go tilt bed.  Once in tilt bed performed 40 degree tilt x 10 min to improve weight bearing and veritcal positioning.    Ambulation/Gait Ambulation/Gait assistance: (NT)               Stairs            Wheelchair Mobility    Modified Rankin  (Stroke Patients Only)       Balance Overall balance assessment: Needs assistance   Sitting balance-Leahy Scale: Zero Sitting balance - Comments: Pt unable to sit  ( long sitting with feet in deependent postion) without external assistance.       Standing balance-Leahy Scale: Zero                              Cognition Arousal/Alertness: Awake/alert Behavior During Therapy: WFL for tasks assessed/performed Overall Cognitive Status: Within Functional Limits for tasks assessed                                        Exercises General Exercises - Lower Extremity Long Arc Quad: AROM;Both(x1 each leg when placed in chair position.  )    General Comments        Pertinent Vitals/Pain Pain Assessment: Faces Faces Pain Scale: Hurts even more Pain Location: generalized due to increased edema and pain when touching patient to assist in mobilization Pain Descriptors / Indicators: Discomfort;Constant;Grimacing Pain Intervention(s): Monitored during session;Repositioned    Home Living                      Prior Function  PT Goals (current goals can now be found in the care plan section) Acute Rehab PT Goals Patient Stated Goal: To walk out of the hospital Potential to Achieve Goals: Fair Progress towards PT goals: Progressing toward goals    Frequency    Min 3X/week      PT Plan Current plan remains appropriate    Co-evaluation              AM-PAC PT "6 Clicks" Daily Activity  Outcome Measure  Difficulty turning over in bed (including adjusting bedclothes, sheets and blankets)?: Unable Difficulty moving from lying on back to sitting on the side of the bed? : Unable Difficulty sitting down on and standing up from a chair with arms (e.g., wheelchair, bedside commode, etc,.)?: Unable Help needed moving to and from a bed to chair (including a wheelchair)?: Total Help needed walking in hospital room?: Total Help  needed climbing 3-5 steps with a railing? : Total 6 Click Score: 6    End of Session Equipment Utilized During Treatment: Gait belt Activity Tolerance: Patient limited by fatigue Patient left: in bed;with call bell/phone within reach;with bed alarm set Nurse Communication: Mobility status PT Visit Diagnosis: Muscle weakness (generalized) (M62.81);Difficulty in walking, not elsewhere classified (R26.2);Pain     Time: 3086-5784 PT Time Calculation (min) (ACUTE ONLY): 70 min  Charges:  $Therapeutic Activity: 68-82 mins                    G Codes:       Governor Rooks, PTA pager [phone]    Cristela Blue 04/28/2017, 4:32 PM

## 2017-04-28 NOTE — Progress Notes (Signed)
Patient ID: John Bailey, male   DOB: [birth year], 47 y.o.   MRN: [id number]     Advanced Heart Failure Rounding Note  PCP-Cardiologist: John Casino, MD  HF Cardiologist: John Champagne, MD  Subjective:    Minimal UOP yesterday.  WBC 21.6. Tmax 100.8.  Feeling OK. Poor UOP as below. RIJ Trialysis cath in place. For HD today.   Creatinine 3.65 -> 4.09. Only 400 cc of UOP yesterday.  Renal has seen and plan for HD today.   Echo (04/25/17): EF 55-60%, severe LVH.   Objective:   Weight Range: (!) 388 lb 7.2 oz (176.2 kg) Body mass index is 52.68 kg/m.   Vital Signs:   Temp:  [98.4 F (36.9 C)-100.8 F (38.2 C)] 99.8 F (37.7 C) (02/13 0300) Pulse Rate:  [86-128] 128 (02/13 0300) Resp:  [18-20] 18 (02/13 0300) BP: (110-158)/(66-88) 158/88 (02/13 0300) SpO2:  [97 %-98 %] 98 % (02/13 0300) Weight:  [388 lb 7.2 oz (176.2 kg)] 388 lb 7.2 oz (176.2 kg) (02/13 0300) Last BM Date: 04/27/17  Weight change: Filed Weights   04/26/17 0513 04/27/17 0545 04/28/17 0300  Weight: (!) 387 lb 5.6 oz (175.7 kg) (!) 385 lb 9.4 oz (174.9 kg) (!) 388 lb 7.2 oz (176.2 kg)   Intake/Output:   Intake/Output Summary (Last 24 hours) at 04/28/2017 0943 Last data filed at 04/28/2017 0507 Gross per 24 hour  Intake 590.57 ml  Output 400 ml  Net 190.57 ml     Physical Exam    General: NAD. Obese.  Neck: Thick. Supple. JVP to jaw.. Carotids 2+ bilat; no bruits. No thyromegaly or nodule noted. RIJ Trialysis cath Cor: PMI nondisplaced. Irregularly irregular, no M/G/R noted Lungs: CTAB, normal effort. Abdomen: Soft, non-tender, non-distended, no HSM. No bruits or masses. +BS  Extremities: No cyanosis, clubbing, or rash. 2+ edema into thighs and abdomen.  Neuro: Alert & orientedx3, cranial nerves grossly intact. moves all 4 extremities w/o difficulty. Affect pleasant   Telemetry   Afib 70s, personally reviewed.   Labs    CBC Recent Labs    04/27/17 0640 04/28/17 0532  WBC 21.5* 21.6*    NEUTROABS 18.7* 18.8*  HGB 9.5* 10.5*  HCT 29.0* 30.9*  MCV 86.1 85.1  PLT 267 812   Basic Metabolic Panel Recent Labs    04/27/17 0640 04/28/17 0532  NA 131* 130*  K 4.8 5.0  CL 96* 95*  CO2 21* 18*  GLUCOSE 115* 91  BUN 63* 74*  CREATININE 3.65* 4.09*  CALCIUM 8.2* 8.3*   Liver Function Tests No results for input(s): AST, ALT, ALKPHOS, BILITOT, PROT, ALBUMIN in the last 72 hours. No results for input(s): LIPASE, AMYLASE in the last 72 hours. Cardiac Enzymes No results for input(s): CKTOTAL, CKMB, CKMBINDEX, TROPONINI in the last 72 hours.  BNP: BNP (last 3 results) Recent Labs    03/06/17 1800 04/22/17 1734 04/23/17 0736  BNP 791.5* 531.3* 627.9*    ProBNP (last 3 results) No results for input(s): PROBNP in the last 8760 hours.   D-Dimer No results for input(s): DDIMER in the last 72 hours. Hemoglobin A1C No results for input(s): HGBA1C in the last 72 hours. Fasting Lipid Panel No results for input(s): CHOL, HDL, LDLCALC, TRIG, CHOLHDL, LDLDIRECT in the last 72 hours. Thyroid Function Tests No results for input(s): TSH, T4TOTAL, T3FREE, THYROIDAB in the last 72 hours.  Invalid input(s): FREET3  Other results:   Imaging    US Renal  Result Date: 04/27/2017 CLINICAL DATA:  Acute renal injury. EXAM: RENAL / URINARY TRACT ULTRASOUND COMPLETE COMPARISON:  Ultrasound 07/31/2015. FINDINGS: Right Kidney: The right kidney is not well seen due to patient's body habitus. No focal right renal abnormalities identified. No hydronephrosis. Renal cortical irregularity suggesting scarring. Length: 12.0 cm. Again limited exam due to patient's body habitus. Echogenicity within normal limits. No hydronephrosis visualized. 3.2 cm hypoechoic area with increased through transmission, most likely a simple cyst lower pole left kidney. Again exam was limited due to patient's body habitus. Bladder: Appears normal for degree of bladder distention. IMPRESSION: Very limited exam due  to patient's body habitus. Right renal cortical scarring appears to be present. Probable simple cyst left lower renal pole. No acute abnormality. No hydronephrosis. No bladder distention. Electronically Signed   By: Marcello Moores  Register   On: 04/27/2017 13:16   Ir Cyndy Freeze Guide Cv Line Right  Result Date: 04/27/2017 INDICATION: 47 year old with acute on chronic renal failure. Patient needs access for hemodialysis. Patient is currently on anticoagulation, recent fever and very edematous. It would be difficult to place a tunneled dialysis catheter at this time. Plan for placement of a non tunneled dialysis catheter. EXAM: FLUOROSCOPIC AND ULTRASOUND GUIDED PLACEMENT OF A NON-TUNNELED DIALYSIS CATHETER Physician: Stephan Minister. Henn, MD MEDICATIONS: None ANESTHESIA/SEDATION: None FLUOROSCOPY TIME:  Fluoroscopy Time: 24 seconds, 9 mGy COMPLICATIONS: None immediate. PROCEDURE: Informed consent was obtained for catheter placement. The patient was placed supine on the interventional table. Ultrasound confirmed a patent right internal jugular vein. Ultrasound images were obtained for documentation. The right side of the neck was prepped and draped in a sterile fashion. The right side of the neck was anesthetized with 1% lidocaine. Maximal barrier sterile technique was utilized including caps, mask, sterile gowns, sterile gloves, sterile drape, hand hygiene and skin antiseptic. A small incision was made with #11 blade scalpel. A 21 gauge needle directed into the right internal jugular vein with ultrasound guidance. A micropuncture dilator set was placed. A 20 cm Mahurkar catheter was selected. The catheter was advanced over a wire and positioned in the superior vena cava. Fluoroscopic images were obtained for documentation. Both dialysis lumens were found to aspirate and flush well. The proper amount of heparin was flushed in both lumens. The central venous lumen was flushed with normal saline. Catheter was sutured to skin.  FINDINGS: Catheter tip in the superior vena cava. IMPRESSION: Successful placement of a right jugular non-tunneled dialysis catheter using ultrasound and fluoroscopic guidance. Electronically Signed   By: Markus Daft M.D.   On: 04/27/2017 17:12   Ir US Guide Vasc Access Right  Result Date: 04/27/2017 INDICATION: 47 year old with acute on chronic renal failure. Patient needs access for hemodialysis. Patient is currently on anticoagulation, recent fever and very edematous. It would be difficult to place a tunneled dialysis catheter at this time. Plan for placement of a non tunneled dialysis catheter. EXAM: FLUOROSCOPIC AND ULTRASOUND GUIDED PLACEMENT OF A NON-TUNNELED DIALYSIS CATHETER Physician: Stephan Minister. Henn, MD MEDICATIONS: None ANESTHESIA/SEDATION: None FLUOROSCOPY TIME:  Fluoroscopy Time: 24 seconds, 9 mGy COMPLICATIONS: None immediate. PROCEDURE: Informed consent was obtained for catheter placement. The patient was placed supine on the interventional table. Ultrasound confirmed a patent right internal jugular vein. Ultrasound images were obtained for documentation. The right side of the neck was prepped and draped in a sterile fashion. The right side of the neck was anesthetized with 1% lidocaine. Maximal barrier sterile technique was utilized including caps, mask, sterile gowns, sterile gloves, sterile drape, hand hygiene and skin antiseptic. A small  incision was made with #11 blade scalpel. A 21 gauge needle directed into the right internal jugular vein with ultrasound guidance. A micropuncture dilator set was placed. A 20 cm Mahurkar catheter was selected. The catheter was advanced over a wire and positioned in the superior vena cava. Fluoroscopic images were obtained for documentation. Both dialysis lumens were found to aspirate and flush well. The proper amount of heparin was flushed in both lumens. The central venous lumen was flushed with normal saline. Catheter was sutured to skin. FINDINGS: Catheter  tip in the superior vena cava. IMPRESSION: Successful placement of a right jugular non-tunneled dialysis catheter using ultrasound and fluoroscopic guidance. Electronically Signed   By: Markus Daft M.D.   On: 04/27/2017 17:12     Medications:     Scheduled Medications: . carvedilol  12.5 mg Oral BID WC  . colchicine  0.3 mg Oral Daily  . fluticasone  2 spray Each Nare Daily  . gabapentin  300 mg Oral BID  . insulin aspart  0-15 Units Subcutaneous TID WC  . insulin aspart  0-5 Units Subcutaneous QHS  . insulin glargine  25 Units Subcutaneous QHS  . sodium chloride flush  3 mL Intravenous Q12H    Infusions: . sodium chloride    . amiodarone 30 mg/hr (04/28/17 0507)  . furosemide Stopped (04/28/17 0536)  . heparin 1,850 Units/hr (04/28/17 0814)    PRN Medications: sodium chloride, acetaminophen, lidocaine (PF), methocarbamol, ondansetron (ZOFRAN) IV, sodium chloride flush, sodium chloride flush    Assessment/Plan   1. Atrial fibrillation with RVR: On review of prior ECGs, last time he was documented to be in NSR was in 1/18. I suspect that he may be better compensated in NSR.  However, will need to diurese some before attempted to cardiovert him. HR better controlled on amiodarone.  - Continue Xarelto.  He says that he has not missed doses.  - Continue Coreg 12.5 mg BID - Continue IV amiodarone 30 mg/hr for rate control. May consider atrial fibrillation ablation in the future, although body habitus will likely make this difficult. Ideally, would not continue amiodarone long term.   - Plan for DCCV after fluid status improves. Starting HD today.  2. Acute on chronic diastolic CHF: Echo this admission with severe LVH, EF 55-60%. Marked volume overload with anasarca.  - IV lasix increased to 160 mg TID by renal. Minimal UOP. Creatinine up to > 4.0 - IR to place HD access today.  3. 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 Will need at least temporary HD. Access placement pending.    4. Fever: - Tmax 100.8 ? RA flare.  - WBC elevated at 21. -  BCx NGTD  5. HTN:  - Remains elevated but to start HD today. Follow.  6. Knee pain:  - Likely related to rheumatoid arthritis.   Length of Stay: [address]  Annamaria Helling  04/28/2017, 9:43 AM  Advanced Heart Failure Team Pager [phone] (M-F; 7a - 4p)  Please contact [facility] for night-coverage after hours (4p -7a ) and weekends on [email or web address]  Patient seen with PA, agree with the above note.  He now has RIJ dialysis catheter.  Renal function continues to worsen and he remains markedly volume overloaded.  He will need extensive fluid removal by dialysis and may end up dialysis dependent at this point.   Tm 101.6, ?due to RA flare.  Cultures negative.   After some volume removal, I think he would benefit from  DCCV to NSR.  Can transition to po amiodarone.   John Bailey 04/28/2017 4:44 PM

## 2017-04-28 NOTE — Progress Notes (Signed)
ANTICOAGULATION CONSULT NOTE - Follow Up Consult  Pharmacy Consult for heparin infusion Indication: atrial fibrillation  No Known Allergies  Patient Measurements: Height: 6' (182.9 cm) Weight: (!) 388 lb 7.2 oz (176.2 kg) IBW/kg (Calculated) : 77.6 Heparin Dosing Weight: 119.5 kg  Vital Signs: Temp: 98.4 F (36.9 C) (02/13 1600) Temp Source: Oral (02/13 1600) BP: 126/73 (02/13 1050) Pulse Rate: 116 (02/13 1050)  Labs: Recent Labs    04/26/17 0000 04/27/17 0640 04/28/17 0532 04/28/17 1551  HGB  --  9.5* 10.5*  --   HCT  --  29.0* 30.9*  --   PLT  --  267 334  --   APTT  --   --  58* 62*  HEPARINUNFRC  --   --  >2.20*  --   CREATININE 2.85* 3.65* 4.09*  --     Estimated Creatinine Clearance: 38.1 mL/min (A) (by C-G formula based on SCr of 4.09 mg/dL (H)).   Medical History: Past Medical History:  Diagnosis Date  . Atrial flutter with rapid ventricular response ([place]) 08/06/2015   a. s/p DCCV in 07/2015 with recurrent atrial fibrillation and DCCV in 12/2016. Initially successful but noted to be back in atrial fibrillation within 2 weeks of DCCV.   [name] Chronic diastolic (congestive) heart failure ([facility])    a. EF 50-55% by echo in 06/2016.  . Diabetes ([place])   . Hypertension     Medications:  Scheduled:  . amiodarone  200 mg Oral BID  . carvedilol  18.75 mg Oral BID WC  . colchicine  0.3 mg Oral Daily  . fluticasone  2 spray Each Nare Daily  . gabapentin  300 mg Oral BID  . insulin aspart  0-15 Units Subcutaneous TID WC  . insulin aspart  0-5 Units Subcutaneous QHS  . insulin glargine  25 Units Subcutaneous QHS  . sodium chloride flush  3 mL Intravenous Q12H    Assessment: 46 yom on Xarelto PTA for Afib. Patient has marked volume overload with increasing Scr and decreasing urine output, despite lasix infusion and metolazone doses. Scr increased from 3.65 to 4.09 > plan trial HD  Last dose of Xarelto was on 2/11 at 2154. Given recent Xarelto exposure, will  evaluate both aPTT and heparin levels until correlate.   Heparin level is falsely elevated at >2.20 (likely due to recent Xarelto administration). aPPT slightly subtherapeutic at 62sec, on 1850 units/hr. Hgb stable 10.5, platelets at 334. No signs/symptoms of bleeding. No infusion issues.  Goal of Therapy:  APTT level: 66-102 s Heparin level 0.3-0.7 units/ml Monitor platelets by anticoagulation protocol: Yes   Plan:  Increase heparin infusion to 20000 units/hr Check anti-Xa level daily while on heparin Continue to monitor H&H and platelets   Bonnita Nasuti Pharm.D. CPP, BCPS Clinical Pharmacist [phone] 04/28/2017 5:36 PM

## 2017-04-28 NOTE — Progress Notes (Signed)
PROGRESS NOTE    John Bailey  SEL:[id number] DOB: Apr 29, 1970 DOA: 04/22/2017 PCP: [name], MD   Brief Narrative:  John Bailey is a 47 y.o.with a PMH of rheumatoid arthritis, diabetes mellitus, heart failure, atrial fibrillation. Patient presented with dyspnea and found to have a CHF exacerbation. Diuresed with worsening kidney function. Now has Fever and new leukocytosis. Infectious workup so far is unremarkable but will repeat Urinalysis and Urine Cx. Low Threshold to consult ID for evaluation and may need Ortho Consulted for Joint Aspiration.   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 Weight essentially unchanged in setting of IV diuresis and is now388.  -Echocardiogram significant for an EF of 55-60% and severe LVH with dilated left atrium and trivial pericardial effusion; unable to assess diastolic dysfunction -Cardiology recommendations: Given IV Lasix B 160 mg bolus q6h with poor UOP and worsening Cr -C/w Strict I's/O's, Daily Weights, SLIV  -Temp Dialysis Cath placed yesterday and will undergo Dialysis today and may come Dialysis Dependent   Anasarca -Secondary to fluid overload. -Management above  Rheumatoid Arthritis -Patient on Abatacept every Tuesday.  -Knee x-rays negative for knee effusion -Holdin Abatacept in setting of possible infection -Bilateral knee x-ray Negative -C/w Methocarbamol q8hprn  -C/w Gabapentin 300 mg po BID   Atrial fibrillation with RVR -Rate  controlled. May need to reconsider Xarelto in setting of CKD IV as an outpatient; C/w Heparin gtt for now  -On Amiodarone gtt per Cardiology changed to po Amiodarone 200 mg po BID.  -C/w Carvedilol 18.75 mg po BID  -Plan for DCCV at some point after Volume Removal   AKI on CKD Stage IV -Worsening and now ESRD -Received Temporary Access Last night  -Will need Dialysis today as Cr worsened to 4.09  Hyperkalemia -In the setting of worsening Kidney Disease. -K+ now 5.0 -Likely to be corrected in HD  SIRS suspect RA Flare vs Gouty Flare -Unknown etiology.  -Some tachycardia in setting of atrial fibrillation. -Otherwise, hemodynamically stable.  -Infectious workup so far has been negative. Patient with history of RA and possible this could be a flair. -Kidney function worsening and would not start NSAIDs.  -Patient declining steroids if in fact infectious source  -Repeat Urinalysis and obtain urine culture in setting of AKI.  -Blood Cx x2 show NGTD at 2 days  -CXR showed No postprocedure complication following right internal jugular venous catheter placement. Cardiomegaly with mild pulmonary interstitial edema consistent with CHF. -ESR was 110 and CRP was 34.1 -Check Uric Acid Level  -C/w Colchicine  -WBC went from 8.8 -> 21.6 -Continue to Monitor and will call patient's Rheumatologist at [facility] in AM and potentially discuss with Infectious Diseases if Infectious workup continues to be negative.   Diabetes Mellitus  -C/w Lantus 25 units sq qHS and with Moderate Novolog SSI AC/HS -CBG's ranging from 97-113 and Continue to Monitor   DVT prophylaxis: Anticoagulated with Heparin gtt Code Status: FULL CODE Family Communication: Family at bedside Disposition Plan: SNF when medically stable to D/C  Consultants:   Cardiology Heart Failure Team  Nephrology  Interventional Radiology   Procedures:  ECHOCARDIOGRAM  04/25/17 ------------------------------------------------------------------- Study Conclusions  - Left ventricle: The cavity size was normal. Wall thickness was   increased in a pattern of severe LVH. Systolic function was   normal. The estimated ejection fraction was in the range of 55%   to 60%. The study is not technically sufficient to allow   evaluation of LV diastolic function. - Mitral valve: There was mild regurgitation. - Left atrium: The atrium was moderately dilated. - Pericardium, extracardiac: A trivial pericardial effusion was   identified posterior to the heart.  Antimicrobials:  Anti-infectives (From admission, onward)   None     Subjective: Seen and examined and thinks swelling was slightly better but complained of severe hand pain. No CP or SOB. Still very swollen. No nausea or vomiting.   Objective: Vitals:   04/27/17 0844 04/27/17 1441 04/27/17 2124 04/28/17 0300  BP: 115/74 110/66 124/72 (!) 158/88  Pulse: (!) 120 86 (!) 112 (!) 128  Resp: '20 20 20 18  ' Temp: 99.5 F (37.5 C) 98.4 F (36.9 C) (!) 100.8 F (38.2 C) 99.8 F (37.7 C)  TempSrc: Oral Oral Oral Oral  SpO2: 93% 98% 97% 98%  Weight:    (!) 176.2 kg (388 lb 7.2 oz)  Height:        Intake/Output Summary (Last 24 hours) at 04/28/2017 0810 Last data filed at 04/28/2017 0507 Gross per 24 hour  Intake 590.57 ml  Output 400 ml  Net 190.57 ml   Filed Weights   04/26/17 0513 04/27/17 0545 04/28/17 0300  Weight: (!) 175.7 kg (387 lb 5.6 oz) (!) 174.9 kg (385 lb 9.4 oz) (!) 176.2 kg (388 lb 7.2 oz)   Examination: Physical Exam:  Constitutional: WN/WD morbidly obese AAM NAD and appears calm and comfortable Eyes: Lids and conjunctivae normal, sclerae anicteric  ENMT: External Ears, Nose appear normal. Grossly normal hearing. Mucous membranes are moist.  Neck: Appears normal, supple, no cervical masses, normal ROM, no appreciable thyromegaly, has JVD Has RIJ TDC Respiratory: Diminished to  auscultation bilaterally, no wheezing, rales, rhonchi or crackles. Normal respiratory effort and patient is not tachypenic. No accessory muscle use.  Cardiovascular: Irregularly Irregular, no murmurs / rubs / gallops. S1 and S2 auscultated.  2-3+ LE edema in LE and abdomen   Abdomen: Soft, non-tender, Distended. No masses palpated. No appreciable hepatosplenomegaly. Bowel sounds positive x4.  GU: Deferred. Musculoskeletal: No clubbing / cyanosis of digits/nails. Hands swollen with pain on palpation  Skin: No rashes, lesions, ulcers on a limited skin eval. LE wrapped in ACE bandages. No induration; Warm and dry.  Neurologic: CN 2-12 grossly intact with no focal deficits. Romberg sign cerebellar reflexes not assessed.  Psychiatric: Normal judgment and insight. Alert and oriented x 3. Normal mood and appropriate affect.   Data Reviewed: I have personally reviewed following labs and imaging studies  CBC: Recent Labs  Lab 04/22/17 1733 04/23/17 0736 04/27/17 0640 04/28/17 0532  WBC 8.8 8.4 21.5* 21.6*  NEUTROABS 5.7 5.5 18.7* PENDING  HGB 10.1* 10.1* 9.5* 10.5*  HCT 32.4* 32.8* 29.0* 30.9*  MCV 91.5 91.1 86.1 85.1  PLT 344 364 267 119   Basic Metabolic Panel: Recent Labs  Lab 04/25/17 0546 04/25/17 1521 04/26/17 0000 04/27/17 0640 04/28/17 0532  NA 136 134* 134* 131* 130*  K 6.0* 4.1 4.1 4.8 5.0  CL 102 99* 100* 96* 95*  CO2 19* 22 21* 21* 18*  GLUCOSE 126* 156* 143* 115* 91  BUN 51* 49* 51* 63* 74*  CREATININE 2.59* 2.63* 2.85* 3.65* 4.09*  CALCIUM 8.5* 8.4* 8.1* 8.2* 8.3*   GFR: Estimated Creatinine Clearance: 38.1 mL/min (A) (by C-G formula based on SCr of 4.09 mg/dL (H)). Liver Function Tests: Recent Labs  Lab 04/22/17 1733  AST 12*  ALT 10*  ALKPHOS 101  BILITOT 0.6  PROT 5.8*  ALBUMIN 2.8*   No results for input(s): LIPASE, AMYLASE in the last 168 hours. No results for input(s): AMMONIA in the last 168 hours. Coagulation Profile: No results for input(s):  INR, PROTIME in the last 168 hours. Cardiac Enzymes: No results for input(s): CKTOTAL, CKMB, CKMBINDEX, TROPONINI in the last 168 hours. BNP (last 3 results) No results for input(s): PROBNP in the last 8760 hours. HbA1C: No results for input(s): HGBA1C in the last 72 hours. CBG: Recent Labs  Lab 04/27/17 0734 04/27/17 1133 04/27/17 1816 04/27/17 2121 04/28/17 0730  GLUCAP 113* 103* 124* 114* 97   Lipid Profile: No results for input(s): CHOL, HDL, LDLCALC, TRIG, CHOLHDL, LDLDIRECT in the last 72 hours. Thyroid Function Tests: No results for input(s): TSH, T4TOTAL, FREET4, T3FREE, THYROIDAB in the last 72 hours. Anemia Panel: No results for input(s): VITAMINB12, FOLATE, FERRITIN, TIBC, IRON, RETICCTPCT in the last 72 hours. Sepsis Labs: No results for input(s): PROCALCITON, LATICACIDVEN in the last 168 hours.  Recent Results (from the past 240 hour(s))  Respiratory Panel by PCR     Status: None   Collection Time: 04/25/17 10:50 AM  Result Value Ref Range Status   Adenovirus NOT DETECTED NOT DETECTED Final   Coronavirus 229E NOT DETECTED NOT DETECTED Final   Coronavirus HKU1 NOT DETECTED NOT DETECTED Final   Coronavirus NL63 NOT DETECTED NOT DETECTED Final   Coronavirus OC43 NOT DETECTED NOT DETECTED Final   Metapneumovirus NOT DETECTED NOT DETECTED Final   Rhinovirus / Enterovirus NOT DETECTED NOT DETECTED Final   Influenza A NOT DETECTED NOT DETECTED Final   Influenza A H1 NOT DETECTED NOT DETECTED Final   Influenza A H1 2009 NOT DETECTED NOT DETECTED Final   Influenza A H3 NOT DETECTED NOT DETECTED Final   Influenza B NOT DETECTED NOT DETECTED Final   Parainfluenza Virus 1 NOT DETECTED NOT DETECTED Final   Parainfluenza Virus 2 NOT DETECTED NOT DETECTED Final   Parainfluenza Virus 3 NOT DETECTED NOT DETECTED Final   Parainfluenza Virus 4 NOT DETECTED NOT DETECTED Final   Respiratory Syncytial Virus NOT DETECTED NOT DETECTED Final   Bordetella pertussis NOT DETECTED NOT  DETECTED Final   Chlamydophila pneumoniae NOT DETECTED NOT DETECTED Final   Mycoplasma pneumoniae NOT DETECTED NOT DETECTED Final    Comment: Performed at [facility], [address], [place], [place] [address]  Culture, blood (routine x 2)     Status: None (Preliminary result)   Collection Time: 04/26/17 12:10 AM  Result Value Ref Range Status   Specimen Description BLOOD LEFT HAND  Final   Special Requests IN PEDIATRIC BOTTLE Blood Culture adequate volume  Final   Culture   Final    NO GROWTH 1 DAY Performed at [facility], [place] [address], [place], [place] [address]    Report Status PENDING  Incomplete  Culture, blood (routine x 2)     Status: None (Preliminary result)   Collection Time: 04/26/17 12:18 AM  Result Value Ref Range Status   Specimen Description BLOOD LEFT HAND  Final   Special Requests IN  PEDIATRIC BOTTLE Blood Culture adequate volume  Final   Culture   Final    NO GROWTH 1 DAY Performed at [facility], [place] [address], [place], [place] [address]    Report Status PENDING  Incomplete    Radiology Studies: Dg Knee 1-2 Views Left  Result Date: 04/26/2017 CLINICAL DATA:  Rheumatoid arthritis, knee pain, swelling, obesity EXAM: LEFT KNEE - 1-2 VIEW COMPARISON:  03/06/2017 FINDINGS: Left extremity subcutaneous edema noted. No acute osseous finding, fracture, or malalignment. No effusion. No significant arthropathy or joint effusion. IMPRESSION: Left lower extremity subcutaneous edema. No acute osseous finding or significant arthropathy by plain radiography Electronically Signed   By: Jerilynn Mages.  Shick M.D.   On: 04/26/2017 12:12   Dg Knee 1-2 Views Right  Result Date: 04/26/2017 CLINICAL DATA:  Rheumatoid arthritis, bilateral knee pain, obesity, swelling EXAM: RIGHT KNEE - 1-2 VIEW COMPARISON:  09/05/2015 FINDINGS: Diffuse subcutaneous edema noted. No acute osseous finding, fracture, malalignment, or effusion. No significant arthropathy. Preserved joint spaces.  IMPRESSION: Subcutaneous edema. No acute osseous finding or significant arthropathy by plain radiography Electronically Signed   By: Jerilynn Mages.  Shick M.D.   On: 04/26/2017 12:11   US Renal  Result Date: 04/27/2017 CLINICAL DATA:  Acute renal injury. EXAM: RENAL / URINARY TRACT ULTRASOUND COMPLETE COMPARISON:  Ultrasound 07/31/2015. FINDINGS: Right Kidney: The right kidney is not well seen due to patient's body habitus. No focal right renal abnormalities identified. No hydronephrosis. Renal cortical irregularity suggesting scarring. Length: 12.0 cm. Again limited exam due to patient's body habitus. Echogenicity within normal limits. No hydronephrosis visualized. 3.2 cm hypoechoic area with increased through transmission, most likely a simple cyst lower pole left kidney. Again exam was limited due to patient's body habitus. Bladder: Appears normal for degree of bladder distention. IMPRESSION: Very limited exam due to patient's body habitus. Right renal cortical scarring appears to be present. Probable simple cyst left lower renal pole. No acute abnormality. No hydronephrosis. No bladder distention. Electronically Signed   By: Marcello Moores  Register   On: 04/27/2017 13:16   Ir Cyndy Freeze Guide Cv Line Right  Result Date: 04/27/2017 INDICATION: 47 year old with acute on chronic renal failure. Patient needs access for hemodialysis. Patient is currently on anticoagulation, recent fever and very edematous. It would be difficult to place a tunneled dialysis catheter at this time. Plan for placement of a non tunneled dialysis catheter. EXAM: FLUOROSCOPIC AND ULTRASOUND GUIDED PLACEMENT OF A NON-TUNNELED DIALYSIS CATHETER Physician: Stephan Minister. Henn, MD MEDICATIONS: None ANESTHESIA/SEDATION: None FLUOROSCOPY TIME:  Fluoroscopy Time: 24 seconds, 9 mGy COMPLICATIONS: None immediate. PROCEDURE: Informed consent was obtained for catheter placement. The patient was placed supine on the interventional table. Ultrasound confirmed a patent right  internal jugular vein. Ultrasound images were obtained for documentation. The right side of the neck was prepped and draped in a sterile fashion. The right side of the neck was anesthetized with 1% lidocaine. Maximal barrier sterile technique was utilized including caps, mask, sterile gowns, sterile gloves, sterile drape, hand hygiene and skin antiseptic. A small incision was made with #11 blade scalpel. A 21 gauge needle directed into the right internal jugular vein with ultrasound guidance. A micropuncture dilator set was placed. A 20 cm Mahurkar catheter was selected. The catheter was advanced over a wire and positioned in the superior vena cava. Fluoroscopic images were obtained for documentation. Both dialysis lumens were found to aspirate and flush well. The proper amount of heparin was flushed in both lumens. The central venous lumen was flushed  with normal saline. Catheter was sutured to skin. FINDINGS: Catheter tip in the superior vena cava. IMPRESSION: Successful placement of a right jugular non-tunneled dialysis catheter using ultrasound and fluoroscopic guidance. Electronically Signed   By: Markus Daft M.D.   On: 04/27/2017 17:12   Ir US Guide Vasc Access Right  Result Date: 04/27/2017 INDICATION: 47 year old with acute on chronic renal failure. Patient needs access for hemodialysis. Patient is currently on anticoagulation, recent fever and very edematous. It would be difficult to place a tunneled dialysis catheter at this time. Plan for placement of a non tunneled dialysis catheter. EXAM: FLUOROSCOPIC AND ULTRASOUND GUIDED PLACEMENT OF A NON-TUNNELED DIALYSIS CATHETER Physician: Stephan Minister. Henn, MD MEDICATIONS: None ANESTHESIA/SEDATION: None FLUOROSCOPY TIME:  Fluoroscopy Time: 24 seconds, 9 mGy COMPLICATIONS: None immediate. PROCEDURE: Informed consent was obtained for catheter placement. The patient was placed supine on the interventional table. Ultrasound confirmed a patent right internal jugular  vein. Ultrasound images were obtained for documentation. The right side of the neck was prepped and draped in a sterile fashion. The right side of the neck was anesthetized with 1% lidocaine. Maximal barrier sterile technique was utilized including caps, mask, sterile gowns, sterile gloves, sterile drape, hand hygiene and skin antiseptic. A small incision was made with #11 blade scalpel. A 21 gauge needle directed into the right internal jugular vein with ultrasound guidance. A micropuncture dilator set was placed. A 20 cm Mahurkar catheter was selected. The catheter was advanced over a wire and positioned in the superior vena cava. Fluoroscopic images were obtained for documentation. Both dialysis lumens were found to aspirate and flush well. The proper amount of heparin was flushed in both lumens. The central venous lumen was flushed with normal saline. Catheter was sutured to skin. FINDINGS: Catheter tip in the superior vena cava. IMPRESSION: Successful placement of a right jugular non-tunneled dialysis catheter using ultrasound and fluoroscopic guidance. Electronically Signed   By: Markus Daft M.D.   On: 04/27/2017 17:12   Scheduled Meds: . carvedilol  12.5 mg Oral BID WC  . colchicine  0.3 mg Oral Daily  . fluticasone  2 spray Each Nare Daily  . gabapentin  300 mg Oral BID  . insulin aspart  0-15 Units Subcutaneous TID WC  . insulin aspart  0-5 Units Subcutaneous QHS  . insulin glargine  25 Units Subcutaneous QHS  . sodium chloride flush  3 mL Intravenous Q12H   Continuous Infusions: . sodium chloride    . amiodarone 30 mg/hr (04/28/17 0507)  . furosemide Stopped (04/28/17 0536)  . heparin 1,650 Units/hr (04/28/17 0507)    LOS: 6 days   Kerney Elbe, DO [facility] Pager [phone]  If 7PM-7AM, please contact night-coverage [email or web address] Password [email or web address] 04/28/2017, 8:10 AM

## 2017-04-28 NOTE — Progress Notes (Signed)
[facility] Progress Note    Assessment/ Plan:   Acute on chronic kidney disease: baseline 2.5-3, but acute increase in Cr to 3.65> 4.09 with UOP now 400c despite lasix bolus. Access placed last night, will plan for HD today. K 5.0. BUN increased to 74.  -HD today -lasix bolus pending HD  CHF: appears massively volume overloaded. Goal weight is 285. Current weight 385 per chart review, increasing during this hospitalization. HF team holding diuresis given kidney insult.   Afib with RVR per cards, planning DCCV when volume is removed and stable.  -anticoag per cards and pharmacy  HTN per primary  Fever with increasing WBCs overnight: question role of RA flare, patient with extremity pain. additionally with nasal congestion ?viral URI despite RVP negative. -monitor fever curve   Subjective:   Patient notes some nasal congestion. Feels his swelling is "loosening up" with being moved in the bed.    Objective:   BP (!) 158/88 (BP Location: Left Leg)   Pulse (!) 128   Temp 99.8 F (37.7 C) (Oral)   Resp 18   Ht 6' (1.829 m)   Wt (!) 388 lb 7.2 oz (176.2 kg)   SpO2 98%   BMI 52.68 kg/m   Intake/Output Summary (Last 24 hours) at 04/28/2017 0820 Last data filed at 04/28/2017 0507 Gross per 24 hour  Intake 590.57 ml  Output 400 ml  Net 190.57 ml   Weight change: 2 lb 13.9 oz (1.3 kg)  Physical Exam: Gen: morbidly obese male lying in bed in NAD CVS: irregular rhythm, no murmur Resp: breath sounds distant 2/2 body habitus, easy WOB, no crackles Abd: soft, distended, +BS faint Ext: 3+ edema from toes to sternum  Imaging: Dg Knee 1-2 Views Left  Result Date: 04/26/2017 CLINICAL DATA:  Rheumatoid arthritis, knee pain, swelling, obesity EXAM: LEFT KNEE - 1-2 VIEW COMPARISON:  03/06/2017 FINDINGS: Left extremity subcutaneous edema noted. No acute osseous finding, fracture, or malalignment. No effusion. No significant arthropathy or joint effusion. IMPRESSION:  Left lower extremity subcutaneous edema. No acute osseous finding or significant arthropathy by plain radiography Electronically Signed   By: Jerilynn Mages.  Shick M.D.   On: 04/26/2017 12:12   Dg Knee 1-2 Views Right  Result Date: 04/26/2017 CLINICAL DATA:  Rheumatoid arthritis, bilateral knee pain, obesity, swelling EXAM: RIGHT KNEE - 1-2 VIEW COMPARISON:  09/05/2015 FINDINGS: Diffuse subcutaneous edema noted. No acute osseous finding, fracture, malalignment, or effusion. No significant arthropathy. Preserved joint spaces. IMPRESSION: Subcutaneous edema. No acute osseous finding or significant arthropathy by plain radiography Electronically Signed   By: Jerilynn Mages.  Shick M.D.   On: 04/26/2017 12:11   US Renal  Result Date: 04/27/2017 CLINICAL DATA:  Acute renal injury. EXAM: RENAL / URINARY TRACT ULTRASOUND COMPLETE COMPARISON:  Ultrasound 07/31/2015. FINDINGS: Right Kidney: The right kidney is not well seen due to patient's body habitus. No focal right renal abnormalities identified. No hydronephrosis. Renal cortical irregularity suggesting scarring. Length: 12.0 cm. Again limited exam due to patient's body habitus. Echogenicity within normal limits. No hydronephrosis visualized. 3.2 cm hypoechoic area with increased through transmission, most likely a simple cyst lower pole left kidney. Again exam was limited due to patient's body habitus. Bladder: Appears normal for degree of bladder distention. IMPRESSION: Very limited exam due to patient's body habitus. Right renal cortical scarring appears to be present. Probable simple cyst left lower renal pole. No acute abnormality. No hydronephrosis. No bladder distention. Electronically Signed   By: Marcello Moores  Register   On: 04/27/2017  13:16   Ir Fluoro Guide Cv Line Right  Result Date: 04/27/2017 INDICATION: 47 year old with acute on chronic renal failure. Patient needs access for hemodialysis. Patient is currently on anticoagulation, recent fever and very edematous. It would be  difficult to place a tunneled dialysis catheter at this time. Plan for placement of a non tunneled dialysis catheter. EXAM: FLUOROSCOPIC AND ULTRASOUND GUIDED PLACEMENT OF A NON-TUNNELED DIALYSIS CATHETER Physician: Stephan Minister. Henn, MD MEDICATIONS: None ANESTHESIA/SEDATION: None FLUOROSCOPY TIME:  Fluoroscopy Time: 24 seconds, 9 mGy COMPLICATIONS: None immediate. PROCEDURE: Informed consent was obtained for catheter placement. The patient was placed supine on the interventional table. Ultrasound confirmed a patent right internal jugular vein. Ultrasound images were obtained for documentation. The right side of the neck was prepped and draped in a sterile fashion. The right side of the neck was anesthetized with 1% lidocaine. Maximal barrier sterile technique was utilized including caps, mask, sterile gowns, sterile gloves, sterile drape, hand hygiene and skin antiseptic. A small incision was made with #11 blade scalpel. A 21 gauge needle directed into the right internal jugular vein with ultrasound guidance. A micropuncture dilator set was placed. A 20 cm Mahurkar catheter was selected. The catheter was advanced over a wire and positioned in the superior vena cava. Fluoroscopic images were obtained for documentation. Both dialysis lumens were found to aspirate and flush well. The proper amount of heparin was flushed in both lumens. The central venous lumen was flushed with normal saline. Catheter was sutured to skin. FINDINGS: Catheter tip in the superior vena cava. IMPRESSION: Successful placement of a right jugular non-tunneled dialysis catheter using ultrasound and fluoroscopic guidance. Electronically Signed   By: Markus Daft M.D.   On: 04/27/2017 17:12   Ir US Guide Vasc Access Right  Result Date: 04/27/2017 INDICATION: 48 year old with acute on chronic renal failure. Patient needs access for hemodialysis. Patient is currently on anticoagulation, recent fever and very edematous. It would be difficult to place a  tunneled dialysis catheter at this time. Plan for placement of a non tunneled dialysis catheter. EXAM: FLUOROSCOPIC AND ULTRASOUND GUIDED PLACEMENT OF A NON-TUNNELED DIALYSIS CATHETER Physician: Stephan Minister. Henn, MD MEDICATIONS: None ANESTHESIA/SEDATION: None FLUOROSCOPY TIME:  Fluoroscopy Time: 24 seconds, 9 mGy COMPLICATIONS: None immediate. PROCEDURE: Informed consent was obtained for catheter placement. The patient was placed supine on the interventional table. Ultrasound confirmed a patent right internal jugular vein. Ultrasound images were obtained for documentation. The right side of the neck was prepped and draped in a sterile fashion. The right side of the neck was anesthetized with 1% lidocaine. Maximal barrier sterile technique was utilized including caps, mask, sterile gowns, sterile gloves, sterile drape, hand hygiene and skin antiseptic. A small incision was made with #11 blade scalpel. A 21 gauge needle directed into the right internal jugular vein with ultrasound guidance. A micropuncture dilator set was placed. A 20 cm Mahurkar catheter was selected. The catheter was advanced over a wire and positioned in the superior vena cava. Fluoroscopic images were obtained for documentation. Both dialysis lumens were found to aspirate and flush well. The proper amount of heparin was flushed in both lumens. The central venous lumen was flushed with normal saline. Catheter was sutured to skin. FINDINGS: Catheter tip in the superior vena cava. IMPRESSION: Successful placement of a right jugular non-tunneled dialysis catheter using ultrasound and fluoroscopic guidance. Electronically Signed   By: Markus Daft M.D.   On: 04/27/2017 17:12    Labs: BMET Recent Labs  Lab 04/23/17 1335 04/24/17 0449  04/25/17 0546 04/25/17 1521 04/26/17 0000 04/27/17 0640 04/28/17 0532  NA 140 137 136 134* 134* 131* 130*  K 4.0 4.6 6.0* 4.1 4.1 4.8 5.0  CL 107 103 102 99* 100* 96* 95*  CO2 21* 23 19* 22 21* 21* 18*  GLUCOSE  123* 165* 126* 156* 143* 115* 91  BUN 48* 47* 51* 49* 51* 63* 74*  CREATININE 2.33* 2.33* 2.59* 2.63* 2.85* 3.65* 4.09*  CALCIUM 8.6* 8.7* 8.5* 8.4* 8.1* 8.2* 8.3*   CBC Recent Labs  Lab 04/22/17 1733 04/23/17 0736 04/27/17 0640 04/28/17 0532  WBC 8.8 8.4 21.5* 21.6*  NEUTROABS 5.7 5.5 18.7* PENDING  HGB 10.1* 10.1* 9.5* 10.5*  HCT 32.4* 32.8* 29.0* 30.9*  MCV 91.5 91.1 86.1 85.1  PLT 344 364 267 334    Medications:    . carvedilol  12.5 mg Oral BID WC  . colchicine  0.3 mg Oral Daily  . fluticasone  2 spray Each Nare Daily  . gabapentin  300 mg Oral BID  . insulin aspart  0-15 Units Subcutaneous TID WC  . insulin aspart  0-5 Units Subcutaneous QHS  . insulin glargine  25 Units Subcutaneous QHS  . sodium chloride flush  3 mL Intravenous Q12H     Ralene Ok, MD 04/28/17  I have seen and examined this patient and agree with plan and assessment in the above note with renal recommendations/intervention highlighted.  Has had some increase in UOP with bolus of lasix but will proceed with serial dialysis for UF and will eventually need permanent access placement given his refractory and recurrent cardiorenal syndrome.  Will need to improve his volume status before this can be pursued. Broadus John A Brandey Vandalen,MD 04/28/2017 12:22 PM

## 2017-04-29 ENCOUNTER — Inpatient Hospital Stay (HOSPITAL_COMMUNITY): Payer: BLUE CROSS/BLUE SHIELD

## 2017-04-29 DIAGNOSIS — Z789 Other specified health status: Secondary | ICD-10-CM

## 2017-04-29 LAB — CBC WITH DIFFERENTIAL/PLATELET
Basophils Absolute: 0 10*3/uL (ref 0.0–0.1)
Basophils Relative: 0 %
EOS PCT: 0 %
Eosinophils Absolute: 0 10*3/uL (ref 0.0–0.7)
HEMATOCRIT: 30.1 % — AB (ref 39.0–52.0)
HEMOGLOBIN: 10.3 g/dL — AB (ref 13.0–17.0)
Lymphocytes Relative: 11 %
Lymphs Abs: 2.5 10*3/uL (ref 0.7–4.0)
MCH: 28.5 pg (ref 26.0–34.0)
MCHC: 34.2 g/dL (ref 30.0–36.0)
MCV: 83.4 fL (ref 78.0–100.0)
MONOS PCT: 4 %
Monocytes Absolute: 0.9 10*3/uL (ref 0.1–1.0)
NEUTROS PCT: 85 %
Neutro Abs: 19.4 10*3/uL — ABNORMAL HIGH (ref 1.7–7.7)
Platelets: 359 10*3/uL (ref 150–400)
RBC: 3.61 MIL/uL — AB (ref 4.22–5.81)
RDW: 17.1 % — AB (ref 11.5–15.5)
WBC: 22.8 10*3/uL — AB (ref 4.0–10.5)

## 2017-04-29 LAB — COMPREHENSIVE METABOLIC PANEL
ALBUMIN: 2.1 g/dL — AB (ref 3.5–5.0)
ALT: 6 U/L — ABNORMAL LOW (ref 17–63)
AST: 13 U/L — ABNORMAL LOW (ref 15–41)
Alkaline Phosphatase: 80 U/L (ref 38–126)
Anion gap: 15 (ref 5–15)
BILIRUBIN TOTAL: 0.9 mg/dL (ref 0.3–1.2)
BUN: 64 mg/dL — ABNORMAL HIGH (ref 6–20)
CO2: 22 mmol/L (ref 22–32)
Calcium: 8.3 mg/dL — ABNORMAL LOW (ref 8.9–10.3)
Chloride: 95 mmol/L — ABNORMAL LOW (ref 101–111)
Creatinine, Ser: 4.04 mg/dL — ABNORMAL HIGH (ref 0.61–1.24)
GFR calc non Af Amer: 17 mL/min — ABNORMAL LOW (ref >60)
GFR, EST AFRICAN AMERICAN: 19 mL/min — AB (ref >60)
GLUCOSE: 91 mg/dL (ref 65–99)
POTASSIUM: 4.3 mmol/L (ref 3.5–5.1)
SODIUM: 132 mmol/L — AB (ref 135–145)
TOTAL PROTEIN: 6.2 g/dL — AB (ref 6.5–8.1)

## 2017-04-29 LAB — PHOSPHORUS: PHOSPHORUS: 6 mg/dL — AB (ref 2.5–4.6)

## 2017-04-29 LAB — URIC ACID: URIC ACID, SERUM: 14.2 mg/dL — AB (ref 4.4–7.6)

## 2017-04-29 LAB — GLUCOSE, CAPILLARY
GLUCOSE-CAPILLARY: 91 mg/dL (ref 65–99)
GLUCOSE-CAPILLARY: 87 mg/dL (ref 65–99)
Glucose-Capillary: 65 mg/dL (ref 65–99)
Glucose-Capillary: 145 mg/dL — ABNORMAL HIGH (ref 65–99)

## 2017-04-29 LAB — HEPARIN LEVEL (UNFRACTIONATED)

## 2017-04-29 LAB — APTT
APTT: 55 s — AB (ref 24–36)
APTT: 61 s — AB (ref 24–36)

## 2017-04-29 LAB — HEPATITIS B CORE ANTIBODY, IGM: Hep B C IgM: NEGATIVE

## 2017-04-29 LAB — MAGNESIUM: Magnesium: 2.2 mg/dL (ref 1.7–2.4)

## 2017-04-29 LAB — HEPATITIS B SURFACE ANTIBODY, QUANTITATIVE

## 2017-04-29 MED ORDER — AMIODARONE LOAD VIA INFUSION
150.0000 mg | Freq: Once | INTRAVENOUS | Status: AC
  Administered 2017-04-29: 150 mg via INTRAVENOUS
  Filled 2017-04-29: qty 83.34

## 2017-04-29 MED ORDER — AMIODARONE HCL IN DEXTROSE 360-4.14 MG/200ML-% IV SOLN
30.0000 mg/h | INTRAVENOUS | Status: DC
  Administered 2017-04-29 – 2017-05-03 (×8): 30 mg/h via INTRAVENOUS
  Filled 2017-04-29 (×10): qty 200

## 2017-04-29 MED ORDER — SODIUM CHLORIDE 0.9% FLUSH: 3.0000 mL | INTRAVENOUS | Status: DC | PRN

## 2017-04-29 MED ORDER — ALTEPLASE 2 MG IJ SOLR
INTRAMUSCULAR | Status: AC
  Filled 2017-04-29: qty 4

## 2017-04-29 MED ORDER — SODIUM CHLORIDE 0.9 % IV SOLN: 250.0000 mL | INTRAVENOUS | Status: DC

## 2017-04-29 MED ORDER — SODIUM CHLORIDE 0.9% FLUSH
3.0000 mL | Freq: Two times a day (BID) | INTRAVENOUS | Status: DC
  Administered 2017-04-29 – 2017-05-13 (×10): 3 mL via INTRAVENOUS

## 2017-04-29 MED ORDER — PREDNISONE 20 MG PO TABS
60.0000 mg | ORAL_TABLET | Freq: Every day | ORAL | Status: DC
  Administered 2017-04-29 – 2017-05-03 (×3): 60 mg via ORAL
  Filled 2017-04-29 (×3): qty 3

## 2017-04-29 NOTE — Progress Notes (Signed)
ANTICOAGULATION CONSULT NOTE - Follow Up Consult  Pharmacy Consult for heparin infusion Indication: atrial fibrillation  No Known Allergies  Patient Measurements: Height: 6' (182.9 cm) Weight: (bed scale broken unable to weigh) IBW/kg (Calculated) : 77.6 Heparin Dosing Weight: 119.5 kg  Vital Signs: Temp: 99.4 F (37.4 C) (02/14 2116) Temp Source: Axillary (02/14 2116) BP: 124/76 (02/14 2116) Pulse Rate: 120 (02/14 2116)  Labs: Recent Labs    04/27/17 0640  04/28/17 0532 04/28/17 1551 04/29/17 0443 04/29/17 2131  HGB 9.5*  --  10.5*  --  10.3*  --   HCT 29.0*  --  30.9*  --  30.1*  --   PLT 267  --  334  --  359  --   APTT  --    < > 58* 62* 55* 61*  HEPARINUNFRC  --   --  >2.20*  --  >2.20*  --   CREATININE 3.65*  --  4.09*  --  4.04*  --    < > = values in this interval not displayed.    Estimated Creatinine Clearance: 38.5 mL/min (A) (by C-G formula based on SCr of 4.04 mg/dL (H)).   Medical History: Past Medical History:  Diagnosis Date  . Atrial flutter with rapid ventricular response ([place]) 08/06/2015   a. s/p DCCV in 07/2015 with recurrent atrial fibrillation and DCCV in 12/2016. Initially successful but noted to be back in atrial fibrillation within 2 weeks of DCCV.   [name] Chronic diastolic (congestive) heart failure ([facility])    a. EF 50-55% by echo in 06/2016.  . Diabetes ([place])   . Hypertension     Medications:  Scheduled:  . alteplase      . carvedilol  18.75 mg Oral BID WC  . colchicine  0.3 mg Oral Daily  . fluticasone  2 spray Each Nare Daily  . gabapentin  300 mg Oral BID  . insulin aspart  0-15 Units Subcutaneous TID WC  . insulin aspart  0-5 Units Subcutaneous QHS  . insulin glargine  25 Units Subcutaneous QHS  . predniSONE  60 mg Oral Q breakfast  . sodium chloride flush  3 mL Intravenous Q12H  . sodium chloride flush  3 mL Intravenous Q12H    Assessment: 46 yom on Xarelto PTA for Afib. Patient has marked volume overload with increasing Scr  and decreasing urine output, despite lasix infusion and metolazone doses. Scr increased from 3.65 to 4.09.   Last dose of Xarelto was on 2/11 at 2154. Given recent Xarelto exposure, will evaluate both aPTT and heparin levels until correlate.   Heparin level is falsely elevated at >2.20 (likely due to recent Xarelto administration). aPTT slightly subtherapeutic at 61 sec, on 2200 units/hr. Hgb increased to 10.5, platelets at 334 yesterday. No signs/symptoms of bleeding. No infusion issues. Received first session of HD yesterday. Plan for DCCV tomorrow.  Goal of Therapy:  APTT level: 66-102 s Heparin level 0.3-0.7 units/ml Monitor platelets by anticoagulation protocol: Yes   Plan:  Increase heparin infusion to 2500 units/hr Check anti-Xa level  daily while on heparin Continue to monitor H&H and platelets  Bonnita Nasuti Pharm.D. CPP, BCPS Clinical Pharmacist [phone] 04/29/2017 10:26 PM

## 2017-04-29 NOTE — Progress Notes (Signed)
PROGRESS NOTE    John Bailey  IOE:[id number] DOB: 1971-01-21 DOA: 04/22/2017 PCP: [name], MD   Brief Narrative:  John Bailey is a 47 y.o.with a PMH of rheumatoid arthritis, diabetes mellitus, heart failure, atrial fibrillation. Patient presented with dyspnea and found to have a CHF exacerbation. Diuresed with worsening kidney function. Now has Fever and new leukocytosis. Infectious workup so far is unremarkable. Discussed case with ID and patient's Rheumatologist and will start patient on po Prednisone. After discussion with Cardiology and Nephrology, patient to under go DCCV tomorrow afternoon after Dialysis.    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  -Weight is now down 2 lbs setting of IV diuresis and Dialysis and is now 386.  -Echocardiogram significant for an EF of 55-60% and severe LVH with dilated left atrium and trivial pericardial effusion; unable to assess diastolic dysfunction -Cardiology recommendations: Given IV Lasix 160 mg bolus q6h with poor UOP and worsening Cr -C/w Strict I's/O's, Daily Weights, SLIV  -Trialysis Cath placed 2/12/18and will undergo Dialysis daily and may come Dialysis Dependent   Anasarca -Secondary to fluid overload. -Management above  Rheumatoid Arthritis Flare -Patient on Abatacept every Tuesday.  -Knee x-rays negative for knee effusion -Holding Abatacept in setting of possible infection -Bilateral knee x-ray Negative -C/w Methocarbamol  q8hprn  -C/w Gabapentin 300 mg po BID  -Discussed with patient's outpatient Rheumatologist Dr. [name] and recommending po Prednisone and tapering   Atrial fibrillation with RVR,  -Rate uncontrolled today. C/w Heparin gtt for now; Was on Xarelto as an outpatient and will go home on Coumadin at D/C -Back On Amiodarone gtt per Cardiology; po Amiodarone 200 mg po BID. Now D/C'd -Will transfer to SDU given high risk  -C/w Carvedilol 18.75 mg po BID  -Plan for DCCV at some point after some Volume Removal and after discussing with Dr. Aundra Dubin will be done tomorrow afternoon    AKI on CKD Stage IV -Worsening and now ESRD -Per Nephro will get daily dialysis  -Discussed with Dr. Marval Regal and we will consult VVS for Vascular Access Assessment/Placement  Hyperkalemia, improved -In the setting of worsening Kidney Disease. -K+ now 4.3 -Continue to monitor daily CMP's  SIRS suspect RA Flare vs Gouty Flare or Combination -Unknown etiology but likely combination of RA Flare and Gout Flare   -Some tachycardia in setting of atrial fibrillation but probably worse from Pain -Otherwise, hemodynamically stable.  -Infectious workup so far has been negative. Patient with history of RA and possible this could be a flair.  -Kidney function worsening and would not start NSAIDs.  -Discussed with patient about starting steroids and will start Prednisone 60 mg po Daily and Taper   -Repeat Urinalysis and obtain urine culture in setting of AKI pending  -Blood Cx x2 show NGTD at 3 days  -CXR this AM showed Stable very low lung volumes, which limits evaluation. No evidence of pulmonary consolidation. -ESR was 110 and CRP was 34.1 -Checked Uric Acid Level and was 14.2 -C/w Colchicine per Nephrology  -WBC  went from 8.8 -> 21.6 -> 22.8 -Discussed with Infectious Diseases Dr. Tommy Medal who feels Fever and WBC is related to RA -Discussed with patient's Rheumatologist Dr. [name] who recommended starting po  Prednisone and continuing to hold Abatacept   Diabetes Mellitus  -C/w Lantus 25 units sq qHS and with Moderate Novolog SSI AC/HS -CBG's ranging from 87-106 and Continue to Monitor   DVT prophylaxis: Anticoagulated with Heparin gtt Code Status: FULL CODE Family Communication: No family present at bedside Disposition Plan: SNF when medically stable to D/C   Consultants:   Cardiology Heart Failure Team  Nephrology  Interventional Radiology  Discussed Case with ID Dr. Tommy Medal  Discussed Case with Patient's Primary Rheumatologist Dr. [name]    Procedures:  ECHOCARDIOGRAM 04/25/17 ------------------------------------------------------------------- Study Conclusions  - Left ventricle: The cavity size was normal. Wall thickness was   increased in a pattern of severe LVH. Systolic function was   normal. The estimated ejection fraction was in the range of 55%   to 60%. The study is not technically sufficient to allow   evaluation of LV diastolic function. - Mitral valve: There was mild regurgitation. - Left atrium: The atrium was moderately dilated. - Pericardium, extracardiac: A trivial pericardial effusion was   identified posterior to the heart.  Antimicrobials:  Anti-infectives (From admission, onward)   None     Subjective: Seen and examined and still extremely swollen. States he has to get used to the new bed. No CP or SOB. No lightheadedness or dizziness.   Objective: Vitals:   04/29/17 1302 04/29/17 1330 04/29/17 1400 04/29/17 1430  BP: 116/66 100/78 (!) 101/53 134/81  Pulse: 86 74 (!) 131 (!) 124  Resp: (!) 27 (!) 25 (!) 21 (!) 26  Temp:      TempSrc:      SpO2:      Weight:      Height:        Intake/Output Summary (Last 24 hours) at 04/29/2017 1608 Last data filed at 04/29/2017 0944 Gross per 24 hour  Intake 549.13 ml  Output 3650 ml  Net -3100.87 ml   Filed Weights   04/28/17 0300 04/29/17 0500  Weight: (!) 176.2 kg (388 lb 7.2 oz) (!) 175.5  kg (386 lb 14.5 oz)   Examination: Physical Exam:  Constitutional: WN/WD obese AAM in NAD and unlabored breathing.  Eyes: Sclerae anicteric. Lids Normal ENMT: External Ears and nose appear normal. Grossly normal hearing Neck: Supple; has RIJ Trialysis Has JVD Respiratory: Diminished to auscultation bilaterally; No appreciable wheezing/rales/rhonchi. Unlabored breathing  Cardiovascular: Irregularly Irregular and Tachycardic; 2-3+ Edema and diffuse Anasaraca Abdomen: Soft, NT, Severely distended; Bowel sounds present  GU: Deferred Musculoskeletal: No contractures. No cyanosis; Hands swollen with pain on palpation on LE and upper Extremities  Skin: Warm and Dry. LE bandageded Neurologic: CN 2-12 grossly intact. Romberg Sign and Cerebellar reflexes not assessed Psychiatric: Normal mood and affect. Awake and alert  Data Reviewed: I have personally reviewed following labs and imaging studies  CBC: Recent Labs  Lab 04/22/17 1733 04/23/17 0736 04/27/17 0640 04/28/17 0532 04/29/17 0443  WBC 8.8 8.4 21.5* 21.6* 22.8*  NEUTROABS 5.7 5.5 18.7* 18.8* 19.4*  HGB 10.1* 10.1* 9.5* 10.5* 10.3*  HCT 32.4* 32.8* 29.0* 30.9* 30.1*  MCV 91.5 91.1 86.1 85.1 83.4  PLT 344 364 267 334 841   Basic Metabolic Panel: Recent Labs  Lab 04/25/17 1521 04/26/17 0000 04/27/17 0640 04/28/17 0532 04/29/17 0443  NA 134* 134* 131* 130* 132*  K 4.1  4.1 4.8 5.0 4.3  CL 99* 100* 96* 95* 95*  CO2 22 21* 21* 18* 22  GLUCOSE 156* 143* 115* 91 91  BUN 49* 51* 63* 74* 64*  CREATININE 2.63* 2.85* 3.65* 4.09* 4.04*  CALCIUM 8.4* 8.1* 8.2* 8.3* 8.3*  MG  --   --   --   --  2.2  PHOS  --   --   --   --  6.0*   GFR: Estimated Creatinine Clearance: 38.5 mL/min (A) (by C-G formula based on SCr of 4.04 mg/dL (H)). Liver Function Tests: Recent Labs  Lab 04/22/17 1733 04/29/17 0443  AST 12* 13*  ALT 10* 6*  ALKPHOS 101 80  BILITOT 0.6 0.9  PROT 5.8* 6.2*  ALBUMIN 2.8* 2.1*   No results for input(s):  LIPASE, AMYLASE in the last 168 hours. No results for input(s): AMMONIA in the last 168 hours. Coagulation Profile: No results for input(s): INR, PROTIME in the last 168 hours. Cardiac Enzymes: No results for input(s): CKTOTAL, CKMB, CKMBINDEX, TROPONINI in the last 168 hours. BNP (last 3 results) No results for input(s): PROBNP in the last 8760 hours. HbA1C: No results for input(s): HGBA1C in the last 72 hours. CBG: Recent Labs  Lab 04/28/17 1119 04/28/17 1615 04/28/17 2105 04/29/17 0720 04/29/17 1058  GLUCAP 113* 104* 106* 91 87   Lipid Profile: No results for input(s): CHOL, HDL, LDLCALC, TRIG, CHOLHDL, LDLDIRECT in the last 72 hours. Thyroid Function Tests: No results for input(s): TSH, T4TOTAL, FREET4, T3FREE, THYROIDAB in the last 72 hours. Anemia Panel: No results for input(s): VITAMINB12, FOLATE, FERRITIN, TIBC, IRON, RETICCTPCT in the last 72 hours. Sepsis Labs: No results for input(s): PROCALCITON, LATICACIDVEN in the last 168 hours.  Recent Results (from the past 240 hour(s))  Respiratory Panel by PCR     Status: None   Collection Time: 04/25/17 10:50 AM  Result Value Ref Range Status   Adenovirus NOT DETECTED NOT DETECTED Final   Coronavirus 229E NOT DETECTED NOT DETECTED Final   Coronavirus HKU1 NOT DETECTED NOT DETECTED Final   Coronavirus NL63 NOT DETECTED NOT DETECTED Final   Coronavirus OC43 NOT DETECTED NOT DETECTED Final   Metapneumovirus NOT DETECTED NOT DETECTED Final   Rhinovirus / Enterovirus NOT DETECTED NOT DETECTED Final   Influenza A NOT DETECTED NOT DETECTED Final   Influenza A H1 NOT DETECTED NOT DETECTED Final   Influenza A H1 2009 NOT DETECTED NOT DETECTED Final   Influenza A H3 NOT DETECTED NOT DETECTED Final   Influenza B NOT DETECTED NOT DETECTED Final   Parainfluenza Virus 1 NOT DETECTED NOT DETECTED Final   Parainfluenza Virus 2 NOT DETECTED NOT DETECTED Final   Parainfluenza Virus 3 NOT DETECTED NOT DETECTED Final   Parainfluenza  Virus 4 NOT DETECTED NOT DETECTED Final   Respiratory Syncytial Virus NOT DETECTED NOT DETECTED Final   Bordetella pertussis NOT DETECTED NOT DETECTED Final   Chlamydophila pneumoniae NOT DETECTED NOT DETECTED Final   Mycoplasma pneumoniae NOT DETECTED NOT DETECTED Final    Comment: Performed at [facility], [address], [place], [place] [address]  Culture, blood (routine x 2)     Status: None (Preliminary result)   Collection Time: 04/26/17 12:10 AM  Result Value Ref Range Status   Specimen Description BLOOD LEFT HAND  Final   Special Requests IN PEDIATRIC BOTTLE Blood Culture adequate volume  Final   Culture   Final    NO GROWTH 3 DAYS Performed at [facility]  Lab, [address] [place], [place] [address]    Report Status PENDING  Incomplete  Culture, blood (routine x 2)     Status: None (Preliminary result)   Collection Time: 04/26/17 12:18 AM  Result Value Ref Range Status   Specimen Description BLOOD LEFT HAND  Final   Special Requests IN PEDIATRIC BOTTLE Blood Culture adequate volume  Final   Culture   Final    NO GROWTH 3 DAYS Performed at [facility], [place] [address], [place], [place] [address]    Report Status PENDING  Incomplete    Radiology Studies: Ir Fluoro Guide Cv Line Right  Result Date: 04/27/2017 INDICATION: 47 year old with acute on chronic renal failure. Patient needs access for hemodialysis. Patient is currently on anticoagulation, recent fever and very edematous. It would be difficult to place a tunneled dialysis catheter at this time. Plan for placement of a non tunneled dialysis catheter. EXAM: FLUOROSCOPIC AND ULTRASOUND GUIDED PLACEMENT OF A NON-TUNNELED DIALYSIS CATHETER Physician: Stephan Minister. Henn, MD MEDICATIONS: None ANESTHESIA/SEDATION: None FLUOROSCOPY TIME:  Fluoroscopy Time: 24 seconds, 9 mGy COMPLICATIONS: None immediate. PROCEDURE: Informed consent was obtained for catheter placement. The patient was placed supine on the  interventional table. Ultrasound confirmed a patent right internal jugular vein. Ultrasound images were obtained for documentation. The right side of the neck was prepped and draped in a sterile fashion. The right side of the neck was anesthetized with 1% lidocaine. Maximal barrier sterile technique was utilized including caps, mask, sterile gowns, sterile gloves, sterile drape, hand hygiene and skin antiseptic. A small incision was made with #11 blade scalpel. A 21 gauge needle directed into the right internal jugular vein with ultrasound guidance. A micropuncture dilator set was placed. A 20 cm Mahurkar catheter was selected. The catheter was advanced over a wire and positioned in the superior vena cava. Fluoroscopic images were obtained for documentation. Both dialysis lumens were found to aspirate and flush well. The proper amount of heparin was flushed in both lumens. The central venous lumen was flushed with normal saline. Catheter was sutured to skin. FINDINGS: Catheter tip in the superior vena cava. IMPRESSION: Successful placement of a right jugular non-tunneled dialysis catheter using ultrasound and fluoroscopic guidance. Electronically Signed   By: Markus Daft M.D.   On: 04/27/2017 17:12   Ir US Guide Vasc Access Right  Result Date: 04/27/2017 INDICATION: 47 year old with acute on chronic renal failure. Patient needs access for hemodialysis. Patient is currently on anticoagulation, recent fever and very edematous. It would be difficult to place a tunneled dialysis catheter at this time. Plan for placement of a non tunneled dialysis catheter. EXAM: FLUOROSCOPIC AND ULTRASOUND GUIDED PLACEMENT OF A NON-TUNNELED DIALYSIS CATHETER Physician: Stephan Minister. Henn, MD MEDICATIONS: None ANESTHESIA/SEDATION: None FLUOROSCOPY TIME:  Fluoroscopy Time: 24 seconds, 9 mGy COMPLICATIONS: None immediate. PROCEDURE: Informed consent was obtained for catheter placement. The patient was placed supine on the interventional  table. Ultrasound confirmed a patent right internal jugular vein. Ultrasound images were obtained for documentation. The right side of the neck was prepped and draped in a sterile fashion. The right side of the neck was anesthetized with 1% lidocaine. Maximal barrier sterile technique was utilized including caps, mask, sterile gowns, sterile gloves, sterile drape, hand hygiene and skin antiseptic. A small incision was made with #11 blade scalpel. A 21 gauge needle directed into the right internal jugular vein with ultrasound guidance. A micropuncture dilator set was placed. A 20 cm Mahurkar catheter was selected. The catheter was  advanced over a wire and positioned in the superior vena cava. Fluoroscopic images were obtained for documentation. Both dialysis lumens were found to aspirate and flush well. The proper amount of heparin was flushed in both lumens. The central venous lumen was flushed with normal saline. Catheter was sutured to skin. FINDINGS: Catheter tip in the superior vena cava. IMPRESSION: Successful placement of a right jugular non-tunneled dialysis catheter using ultrasound and fluoroscopic guidance. Electronically Signed   By: Markus Daft M.D.   On: 04/27/2017 17:12   Dg Chest Port 1 View  Result Date: 04/29/2017 CLINICAL DATA:  Shortness of breath on exertion. Congestive heart failure. EXAM: PORTABLE CHEST 1 VIEW COMPARISON:  04/28/2017 FINDINGS: Right jugular central venous catheter remains in appropriate position. No pneumothorax visualized. Very low lung volumes are stable and there is no evidence of pulmonary consolidation or pleural effusion. Heart size is enlarged but also unchanged. IMPRESSION: Stable very low lung volumes, which limits evaluation. No evidence of pulmonary consolidation. Electronically Signed   By: Earle Gell M.D.   On: 04/29/2017 07:53   Dg Chest Port 1 View  Result Date: 04/28/2017 CLINICAL DATA:  Status post right central line placement. Shortness of breath.  Chronic CHF, morbid obesity. EXAM: PORTABLE CHEST 1 VIEW COMPARISON:  Chest x-ray of February 7th 2019 FINDINGS: There has been interval placement of a right internal jugular venous catheter. The tip of the catheter projects over the proximal SVC. There is no postplacement pneumothorax. The cardiac silhouette remains enlarged. The pulmonary vascularity remains engorged and the interstitial markings increased. The left hemidiaphragm is obscured. IMPRESSION: No postprocedure complication following right internal jugular venous catheter placement. Cardiomegaly with mild pulmonary interstitial edema consistent with CHF. Electronically Signed   By: David  Martinique M.D.   On: 04/28/2017 12:53   Scheduled Meds: . carvedilol  18.75 mg Oral BID WC  . colchicine  0.3 mg Oral Daily  . fluticasone  2 spray Each Nare Daily  . gabapentin  300 mg Oral BID  . insulin aspart  0-15 Units Subcutaneous TID WC  . insulin aspart  0-5 Units Subcutaneous QHS  . insulin glargine  25 Units Subcutaneous QHS  . predniSONE  60 mg Oral Q breakfast  . sodium chloride flush  3 mL Intravenous Q12H  . sodium chloride flush  3 mL Intravenous Q12H   Continuous Infusions: . sodium chloride    . sodium chloride    . amiodarone 30 mg/hr (04/29/17 1046)  . furosemide 160 mg (04/29/17 1046)  . heparin 2,200 Units/hr (04/29/17 0911)    LOS: 7 days   Kerney Elbe, DO [facility] Pager [phone]  If 7PM-7AM, please contact night-coverage [email or web address] Password [facility] 04/29/2017, 4:08 PM

## 2017-04-29 NOTE — Progress Notes (Signed)
Patients heart rate has been in the 140s tonight. Patients baseline has been 120s-130s. MD has been notified. Patients is asymptomatic. Will continue to monitor.    Drue Flirt, RN

## 2017-04-29 NOTE — Consult Note (Addendum)
[facility] Nurse wound consult note Reason for Consult: Consult requested for bilat legs.  Pt states he is followed by the outpatient wound care center and they are using Aquacel and 4 layer Profore to LLE, which is changed Q week.  He states that an [place] was applied to right leg when he came to the hospital, and there are no open wounds to this leg; it was to control edema.  EMR indicates that [place] boot to RLE was placed on 2/10 by the ortho tech. Wound type: Left leg with 2 healing full thickness wounds; .2X.2X.1cm and .3X.2X.1cm; both to calf ; they are dry, red; no odor, drainage, or fluctuance. Generalized edema to LLE. Dressing procedure/placement/frequency: Continue previous plan of care: Applied Aquacel and foam dressings over wounds and to anterior foot for padding, then 4 layer Profore compression wraps.  WOC will plan to change Q Thurs while patient is in the hospital.  Bedside nurse can page the ortho tech to change right leg IT trainer Q Sun. Pt states he will resume follow-up with the outpatient wound care center after discharge. [name] Girt MSN, RN, [place], [place], [place]

## 2017-04-29 NOTE — Progress Notes (Signed)
ANTICOAGULATION CONSULT NOTE - Follow Up Consult  Pharmacy Consult for heparin infusion Indication: atrial fibrillation  No Known Allergies  Patient Measurements: Height: 6' (182.9 cm) Weight: (!) 386 lb 14.5 oz (175.5 kg) IBW/kg (Calculated) : 77.6 Heparin Dosing Weight: 119.5 kg  Vital Signs: Temp: 99.2 F (37.3 C) (02/14 0737) Temp Source: Oral (02/14 0737) BP: 148/63 (02/14 0737) Pulse Rate: 111 (02/14 0737)  Labs: Recent Labs    04/27/17 0640 04/28/17 0532 04/28/17 1551 04/29/17 0443  HGB 9.5* 10.5*  --  10.3*  HCT 29.0* 30.9*  --  30.1*  PLT 267 334  --  359  APTT  --  58* 62* 55*  HEPARINUNFRC  --  >2.20*  --  >2.20*  CREATININE 3.65* 4.09*  --  4.04*    Estimated Creatinine Clearance: 38.5 mL/min (A) (by C-G formula based on SCr of 4.04 mg/dL (H)).   Medical History: Past Medical History:  Diagnosis Date  . Atrial flutter with rapid ventricular response ([place]) 08/06/2015   a. s/p DCCV in 07/2015 with recurrent atrial fibrillation and DCCV in 12/2016. Initially successful but noted to be back in atrial fibrillation within 2 weeks of DCCV.   [name] Chronic diastolic (congestive) heart failure (HCC)    a. EF 50-55% by echo in 06/2016.  . Diabetes ([place])   . Hypertension     Medications:  Scheduled:  . amiodarone  200 mg Oral BID  . carvedilol  18.75 mg Oral BID WC  . colchicine  0.3 mg Oral Daily  . fluticasone  2 spray Each Nare Daily  . gabapentin  300 mg Oral BID  . insulin aspart  0-15 Units Subcutaneous TID WC  . insulin aspart  0-5 Units Subcutaneous QHS  . insulin glargine  25 Units Subcutaneous QHS  . sodium chloride flush  3 mL Intravenous Q12H    Assessment: 46 yom on Xarelto PTA for Afib. Patient has marked volume overload with increasing Scr and decreasing urine output, despite lasix infusion and metolazone doses. Scr increased from 3.65 to 4.09.   Last dose of Xarelto was on 2/11 at 2154. Given recent Xarelto exposure, will evaluate both  aPTT and heparin levels until correlate.   Heparin level is falsely elevated at >2.20 (likely due to recent Xarelto administration). aPTT slightly subtherapeutic at 55, on 2000 units/hr. Hgb increased to 10.5, platelets at 334 yesterday. No signs/symptoms of bleeding. No infusion issues. Received first session of HD yesterday. Plan for DCCV tomorrow.  Goal of Therapy:  APTT level: 66-102 s Heparin level 0.3-0.7 units/ml Monitor platelets by anticoagulation protocol: Yes   Plan:  Increase heparin infusion to 2200 units/hr Check anti-Xa level in 8 hours and daily while on heparin Continue to monitor H&H and platelets  Doylene Canard, PharmD Clinical Pharmacist  Pager: [phone] Clinical Phone for 04/29/2017 until 3:30pm: [phone] If after 3:30pm, please call main pharmacy at [phone] 04/29/2017,9:06 AM

## 2017-04-29 NOTE — Progress Notes (Signed)
Patient ID: AH BOTT, male   DOB: 04-Aug-1970, 47 y.o.   MRN: [id number]     Advanced Heart Failure Rounding Note  PCP-Cardiologist: Pixie Casino, MD  HF Cardiologist: [name], MD  Subjective:    Creatinine remains elevated despite HD yesterday with 3.3L out. Likely permanent dialysis. HR elevated yesterday with HD, so would likely benefit from being in NSR.   HRs in 120-130s, up into 140s overnight.  WBC 22.8. Tmax 101.6  Fatigued this am. Denies SOB. + Palpitations.   Echo (04/25/17): EF 55-60%, severe LVH.   Objective:   Weight Range: (!) 386 lb 14.5 oz (175.5 kg) Body mass index is 52.47 kg/m.   Vital Signs:   Temp:  [98.1 F (36.7 C)-101.6 F (38.7 C)] 99.2 F (37.3 C) (02/14 0737) Pulse Rate:  [111-134] 111 (02/14 0737) Resp:  [18-20] 18 (02/14 0737) BP: (120-179)/(57-89) 148/63 (02/14 0737) SpO2:  [95 %-100 %] 100 % (02/14 0737) Weight:  [386 lb 14.5 oz (175.5 kg)] 386 lb 14.5 oz (175.5 kg) (02/14 0500) Last BM Date: 04/27/17  Weight change: Filed Weights   04/27/17 0545 04/28/17 0300 04/29/17 0500  Weight: (!) 385 lb 9.4 oz (174.9 kg) (!) 388 lb 7.2 oz (176.2 kg) (!) 386 lb 14.5 oz (175.5 kg)   Intake/Output:   Intake/Output Summary (Last 24 hours) at 04/29/2017 0919 Last data filed at 04/29/2017 0528 Gross per 24 hour  Intake 429.13 ml  Output 3650 ml  Net -3220.87 ml     Physical Exam    General: NAD. Obese.  HEENT: Normal Neck: Thick. Supple. JVP to jaw.. Carotids 2+ bilat; no bruits. No thyromegaly or nodule noted. Cor: PMI nondisplaced. Irregularly irregular. Tachy. No M/G/R noted Lungs: CTAB, normal effort. Abdomen: Soft, non-tender, non-distended, no HSM. No bruits or masses. +BS  Extremities: No cyanosis, clubbing, or rash. 2+ edema into thighs and abdomen. Neuro: Alert & orientedx3, cranial nerves grossly intact. moves all 4 extremities w/o difficulty. Affect pleasant   Telemetry   Afib 120-130s, personally reviewed.     Labs    CBC Recent Labs    04/28/17 0532 04/29/17 0443  WBC 21.6* 22.8*  NEUTROABS 18.8* PENDING  HGB 10.5* 10.3*  HCT 30.9* 30.1*  MCV 85.1 83.4  PLT 334 527   Basic Metabolic Panel Recent Labs    04/28/17 0532 04/29/17 0443  NA 130* 132*  K 5.0 4.3  CL 95* 95*  CO2 18* 22  GLUCOSE 91 91  BUN 74* 64*  CREATININE 4.09* 4.04*  CALCIUM 8.3* 8.3*  MG  --  2.2  PHOS  --  6.0*   Liver Function Tests Recent Labs    04/29/17 0443  AST 13*  ALT 6*  ALKPHOS 80  BILITOT 0.9  PROT 6.2*  ALBUMIN 2.1*   No results for input(s): LIPASE, AMYLASE in the last 72 hours. Cardiac Enzymes No results for input(s): CKTOTAL, CKMB, CKMBINDEX, TROPONINI in the last 72 hours.  BNP: BNP (last 3 results) Recent Labs    03/06/17 1800 04/22/17 1734 04/23/17 0736  BNP 791.5* 531.3* 627.9*    ProBNP (last 3 results) No results for input(s): PROBNP in the last 8760 hours.   D-Dimer No results for input(s): DDIMER in the last 72 hours. Hemoglobin A1C No results for input(s): HGBA1C in the last 72 hours. Fasting Lipid Panel No results for input(s): CHOL, HDL, LDLCALC, TRIG, CHOLHDL, LDLDIRECT in the last 72 hours. Thyroid Function Tests No results for input(s): TSH, T4TOTAL, T3FREE, THYROIDAB  in the last 72 hours.  Invalid input(s): FREET3  Other results:   Imaging    Dg Chest Port 1 View  Result Date: 04/29/2017 CLINICAL DATA:  Shortness of breath on exertion. Congestive heart failure. EXAM: PORTABLE CHEST 1 VIEW COMPARISON:  04/28/2017 FINDINGS: Right jugular central venous catheter remains in appropriate position. No pneumothorax visualized. Very low lung volumes are stable and there is no evidence of pulmonary consolidation or pleural effusion. Heart size is enlarged but also unchanged. IMPRESSION: Stable very low lung volumes, which limits evaluation. No evidence of pulmonary consolidation. Electronically Signed   By: Earle Gell M.D.   On: 04/29/2017 07:53   Dg  Chest Port 1 View  Result Date: 04/28/2017 CLINICAL DATA:  Status post right central line placement. Shortness of breath. Chronic CHF, morbid obesity. EXAM: PORTABLE CHEST 1 VIEW COMPARISON:  Chest x-ray of February 7th 2019 FINDINGS: There has been interval placement of a right internal jugular venous catheter. The tip of the catheter projects over the proximal SVC. There is no postplacement pneumothorax. The cardiac silhouette remains enlarged. The pulmonary vascularity remains engorged and the interstitial markings increased. The left hemidiaphragm is obscured. IMPRESSION: No postprocedure complication following right internal jugular venous catheter placement. Cardiomegaly with mild pulmonary interstitial edema consistent with CHF. Electronically Signed   By: David  Martinique M.D.   On: 04/28/2017 12:53     Medications:     Scheduled Medications: . amiodarone  200 mg Oral BID  . carvedilol  18.75 mg Oral BID WC  . colchicine  0.3 mg Oral Daily  . fluticasone  2 spray Each Nare Daily  . gabapentin  300 mg Oral BID  . insulin aspart  0-15 Units Subcutaneous TID WC  . insulin aspart  0-5 Units Subcutaneous QHS  . insulin glargine  25 Units Subcutaneous QHS  . sodium chloride flush  3 mL Intravenous Q12H    Infusions: . sodium chloride    . furosemide 160 mg (04/29/17 0521)  . heparin 2,200 Units/hr (04/29/17 0911)    PRN Medications: sodium chloride, acetaminophen, lidocaine (PF), methocarbamol, ondansetron (ZOFRAN) IV, sodium chloride flush, sodium chloride flush    Assessment/Plan   1. Atrial fibrillation with RVR: On review of prior ECGs, last time he was documented to be in NSR was in 1/18. I suspect that he may be better compensated in NSR.  However, will need to diurese some before attempted to cardiovert him. HR better controlled on amiodarone.  - Off Xarelto with HD access. Continue heparin.  - Continue Coreg 12.5 mg BID - With elevated rates overnight, restart IV  amiodarone with bolus for rate control. May consider atrial fibrillation ablation in the future, although body habitus will likely make this difficult. Ideally, would not continue amiodarone long term. But needs for now with very difficult rate control.  - Plan for DCCV after fluid status improves. Got HD yesterday.  2. Acute on chronic diastolic CHF: Echo this admission with severe LVH, EF 55-60%. Marked volume overload with anasarca.  - Volume status remains elevated. Now per HD.  - Will likely be permanent HD.  3. 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 Renal following.  4. Fever: - Tmax 101.6.  ? RA flare.  - WBC 22.8.  -  BCx Negative.   5. HTN:  - Improved with dialysis.  6. Knee pain:  - Likely related to rheumatoid arthritis. No change.  - Knee Xray with subQ edema. No acute osseous findings.  Length of Stay: [place], [place]  04/29/2017, 9:19 AM  Advanced Heart Failure Team Pager [phone] (M-F; 7a - 4p)  Please contact [facility] for night-coverage after hours (4p -7a ) and weekends on [email or web address]  Patient seen with PA, agree with the above note.    HR up in the 130s in atrial fibrillation today, has not done well coming off IV amiodarone.  He remains markedly volume overloaded but started HD yesterday.  He had about 3L off with HD.   On exam, he has anasarca with JVP 15-16 cm.  Irregular, tachy heart rhythm.    We will restart amiodarone gtt today with bolus.  He will need DCCV, but still very volume overloaded and concerned that he could end up intubated with sedation.  He will have HD this afternoon and asked for HD again tomorrow morning.  At 12 noon tomorrow, we have scheduled DCCV.  I discussed risks/benefits with patient and he agrees to proceed.  He did not miss any Xarelto doses prior to coming into the hospital, and he has been on heparin gtt here.  Therefore, do not think he has to have TEE.  He is going to be transitioned to warfarin before  going home.   As above, will get ongoing HD.  I suspect that he is going to end up being HD dependent.   [name] 04/29/2017 10:36 AM

## 2017-04-29 NOTE — Progress Notes (Signed)
[facility] Progress Note    Assessment/ Plan:   Acute on chronic kidney disease: baseline 2.5-3, but acute increase in Cr to 3.65> 4.09>4.04.  -HD today and 2/15  CHF: remains massively volume overloaded despite HD with UF of 3.5 liters yesterday. Goal weight is 285. Current weight 385 per chart review, increasing during this hospitalization. HF team holding diuresis given kidney insult, however we restarted high dose IV lasix at 160 mg q6 and will continue due to severe anasarca as well as daily dialysis.   Afib with RVR per cards, planning DCCV when volume is removed and stable.  -anticoag per cards and pharmacy  HTN per primary  Oral and mucosal blisters new overnight, patient reports peeling/scrubbing them with a washcloth.  -question medication reaction vs infectious etiology -consider blood cultures per primary  Fever with increasing WBCs overnight: question role of RA flare, patient with extremity pain. additionally with nasal congestion ?viral URI despite RVP negative. -monitor fever curve   Subjective:   Patient notes blisters as above. Reports they came on last night and he scrubbed them with a washcloth. Tolerated HD well.    Objective:   BP (!) 148/63 (BP Location: Left Leg)   Pulse (!) 111   Temp 99.2 F (37.3 C) (Oral)   Resp 18   Ht 6' (1.829 m)   Wt (!) 386 lb 14.5 oz (175.5 kg)   SpO2 100%   BMI 52.47 kg/m   Intake/Output Summary (Last 24 hours) at 04/29/2017 1660 Last data filed at 04/29/2017 6301 Gross per 24 hour  Intake 429.13 ml  Output 3650 ml  Net -3220.87 ml   Weight change: -8.7 oz (-0.7 kg)  Physical Exam: Gen: morbidly obese male lying in bed in NAD CVS: irregular rhythm, no murmur Resp: breath sounds distant 2/2 body habitus, easy WOB, no crackles Abd: soft, distended, +BS faint Ext: 3+ edema from toes to sternum  Imaging: US Renal  Result Date: 04/27/2017 CLINICAL DATA:  Acute renal injury. EXAM: RENAL / URINARY  TRACT ULTRASOUND COMPLETE COMPARISON:  Ultrasound 07/31/2015. FINDINGS: Right Kidney: The right kidney is not well seen due to patient's body habitus. No focal right renal abnormalities identified. No hydronephrosis. Renal cortical irregularity suggesting scarring. Length: 12.0 cm. Again limited exam due to patient's body habitus. Echogenicity within normal limits. No hydronephrosis visualized. 3.2 cm hypoechoic area with increased through transmission, most likely a simple cyst lower pole left kidney. Again exam was limited due to patient's body habitus. Bladder: Appears normal for degree of bladder distention. IMPRESSION: Very limited exam due to patient's body habitus. Right renal cortical scarring appears to be present. Probable simple cyst left lower renal pole. No acute abnormality. No hydronephrosis. No bladder distention. Electronically Signed   By: Marcello Moores  Register   On: 04/27/2017 13:16   Ir Cyndy Freeze Guide Cv Line Right  Result Date: 04/27/2017 INDICATION: 47 year old with acute on chronic renal failure. Patient needs access for hemodialysis. Patient is currently on anticoagulation, recent fever and very edematous. It would be difficult to place a tunneled dialysis catheter at this time. Plan for placement of a non tunneled dialysis catheter. EXAM: FLUOROSCOPIC AND ULTRASOUND GUIDED PLACEMENT OF A NON-TUNNELED DIALYSIS CATHETER Physician: Stephan Minister. Henn, MD MEDICATIONS: None ANESTHESIA/SEDATION: None FLUOROSCOPY TIME:  Fluoroscopy Time: 24 seconds, 9 mGy COMPLICATIONS: None immediate. PROCEDURE: Informed consent was obtained for catheter placement. The patient was placed supine on the interventional table. Ultrasound confirmed a patent right internal jugular vein. Ultrasound images were obtained for documentation.  The right side of the neck was prepped and draped in a sterile fashion. The right side of the neck was anesthetized with 1% lidocaine. Maximal barrier sterile technique was utilized including  caps, mask, sterile gowns, sterile gloves, sterile drape, hand hygiene and skin antiseptic. A small incision was made with #11 blade scalpel. A 21 gauge needle directed into the right internal jugular vein with ultrasound guidance. A micropuncture dilator set was placed. A 20 cm Mahurkar catheter was selected. The catheter was advanced over a wire and positioned in the superior vena cava. Fluoroscopic images were obtained for documentation. Both dialysis lumens were found to aspirate and flush well. The proper amount of heparin was flushed in both lumens. The central venous lumen was flushed with normal saline. Catheter was sutured to skin. FINDINGS: Catheter tip in the superior vena cava. IMPRESSION: Successful placement of a right jugular non-tunneled dialysis catheter using ultrasound and fluoroscopic guidance. Electronically Signed   By: Markus Daft M.D.   On: 04/27/2017 17:12   Ir US Guide Vasc Access Right  Result Date: 04/27/2017 INDICATION: 47 year old with acute on chronic renal failure. Patient needs access for hemodialysis. Patient is currently on anticoagulation, recent fever and very edematous. It would be difficult to place a tunneled dialysis catheter at this time. Plan for placement of a non tunneled dialysis catheter. EXAM: FLUOROSCOPIC AND ULTRASOUND GUIDED PLACEMENT OF A NON-TUNNELED DIALYSIS CATHETER Physician: Stephan Minister. Henn, MD MEDICATIONS: None ANESTHESIA/SEDATION: None FLUOROSCOPY TIME:  Fluoroscopy Time: 24 seconds, 9 mGy COMPLICATIONS: None immediate. PROCEDURE: Informed consent was obtained for catheter placement. The patient was placed supine on the interventional table. Ultrasound confirmed a patent right internal jugular vein. Ultrasound images were obtained for documentation. The right side of the neck was prepped and draped in a sterile fashion. The right side of the neck was anesthetized with 1% lidocaine. Maximal barrier sterile technique was utilized including caps, mask, sterile  gowns, sterile gloves, sterile drape, hand hygiene and skin antiseptic. A small incision was made with #11 blade scalpel. A 21 gauge needle directed into the right internal jugular vein with ultrasound guidance. A micropuncture dilator set was placed. A 20 cm Mahurkar catheter was selected. The catheter was advanced over a wire and positioned in the superior vena cava. Fluoroscopic images were obtained for documentation. Both dialysis lumens were found to aspirate and flush well. The proper amount of heparin was flushed in both lumens. The central venous lumen was flushed with normal saline. Catheter was sutured to skin. FINDINGS: Catheter tip in the superior vena cava. IMPRESSION: Successful placement of a right jugular non-tunneled dialysis catheter using ultrasound and fluoroscopic guidance. Electronically Signed   By: Markus Daft M.D.   On: 04/27/2017 17:12   Dg Chest Port 1 View  Result Date: 04/29/2017 CLINICAL DATA:  Shortness of breath on exertion. Congestive heart failure. EXAM: PORTABLE CHEST 1 VIEW COMPARISON:  04/28/2017 FINDINGS: Right jugular central venous catheter remains in appropriate position. No pneumothorax visualized. Very low lung volumes are stable and there is no evidence of pulmonary consolidation or pleural effusion. Heart size is enlarged but also unchanged. IMPRESSION: Stable very low lung volumes, which limits evaluation. No evidence of pulmonary consolidation. Electronically Signed   By: Earle Gell M.D.   On: 04/29/2017 07:53   Dg Chest Port 1 View  Result Date: 04/28/2017 CLINICAL DATA:  Status post right central line placement. Shortness of breath. Chronic CHF, morbid obesity. EXAM: PORTABLE CHEST 1 VIEW COMPARISON:  Chest x-ray of February 7th  2019 FINDINGS: There has been interval placement of a right internal jugular venous catheter. The tip of the catheter projects over the proximal SVC. There is no postplacement pneumothorax. The cardiac silhouette remains enlarged. The  pulmonary vascularity remains engorged and the interstitial markings increased. The left hemidiaphragm is obscured. IMPRESSION: No postprocedure complication following right internal jugular venous catheter placement. Cardiomegaly with mild pulmonary interstitial edema consistent with CHF. Electronically Signed   By: David  Martinique M.D.   On: 04/28/2017 12:53    Labs: BMET Recent Labs  Lab 04/24/17 0449 04/25/17 0546 04/25/17 1521 04/26/17 0000 04/27/17 0640 04/28/17 0532 04/29/17 0443  NA 137 136 134* 134* 131* 130* 132*  K 4.6 6.0* 4.1 4.1 4.8 5.0 4.3  CL 103 102 99* 100* 96* 95* 95*  CO2 23 19* 22 21* 21* 18* 22  GLUCOSE 165* 126* 156* 143* 115* 91 91  BUN 47* 51* 49* 51* 63* 74* 64*  CREATININE 2.33* 2.59* 2.63* 2.85* 3.65* 4.09* 4.04*  CALCIUM 8.7* 8.5* 8.4* 8.1* 8.2* 8.3* 8.3*  PHOS  --   --   --   --   --   --  6.0*   CBC Recent Labs  Lab 04/23/17 0736 04/27/17 0640 04/28/17 0532 04/29/17 0443  WBC 8.4 21.5* 21.6* 22.8*  NEUTROABS 5.5 18.7* 18.8* PENDING  HGB 10.1* 9.5* 10.5* 10.3*  HCT 32.8* 29.0* 30.9* 30.1*  MCV 91.1 86.1 85.1 83.4  PLT 364 267 334 359    Medications:    . amiodarone  200 mg Oral BID  . carvedilol  18.75 mg Oral BID WC  . colchicine  0.3 mg Oral Daily  . fluticasone  2 spray Each Nare Daily  . gabapentin  300 mg Oral BID  . insulin aspart  0-15 Units Subcutaneous TID WC  . insulin aspart  0-5 Units Subcutaneous QHS  . insulin glargine  25 Units Subcutaneous QHS  . sodium chloride flush  3 mL Intravenous Q12H     Ralene Ok, MD 04/29/17  I have seen and examined this patient and agree with plan and assessment in the above note with renal recommendations/intervention highlighted.  Will continue with daily dialysis and will consult [facility] next week for vascular access assessment/placement once his volume status has improved.  He will likely require at least and AVF/AVG but may require tunneled HD catheter if his renal function does not  recover. Governor Rooks Drayk Humbarger,MD 04/29/2017 12:18 PM

## 2017-04-29 NOTE — Progress Notes (Signed)
Report given to Gibson. Pt will be transported to  RM 6e15For CVP monitoring, Plan to cardiovert and Iv Diureses, Currently at Dialysis treatment.

## 2017-04-29 NOTE — Progress Notes (Signed)
After evaluation, patient already has a trialysis catheter in place.  This means he has HD access as well as a 3 port that is a central line access that can be used.  He does not require any further central access to be placed.  Henreitta Cea 2:20 PM 04/29/2017

## 2017-04-30 ENCOUNTER — Inpatient Hospital Stay (HOSPITAL_COMMUNITY): Payer: BLUE CROSS/BLUE SHIELD | Admitting: Anesthesiology

## 2017-04-30 ENCOUNTER — Encounter (HOSPITAL_COMMUNITY)
Admission: EM | Disposition: A | Discharge status: Rehabilitation Facility | Payer: Self-pay | Source: Home / Self Care | Attending: Internal Medicine

## 2017-04-30 ENCOUNTER — Encounter (HOSPITAL_COMMUNITY): Payer: Self-pay | Admitting: Anesthesiology

## 2017-04-30 DIAGNOSIS — I4891 Unspecified atrial fibrillation: Secondary | ICD-10-CM

## 2017-04-30 HISTORY — PX: CARDIOVERSION: SHX1299

## 2017-04-30 LAB — CBC
HCT: 29.1 % — ABNORMAL LOW (ref 39.0–52.0)
Hemoglobin: 9.8 g/dL — ABNORMAL LOW (ref 13.0–17.0)
MCH: 28 pg (ref 26.0–34.0)
MCHC: 33.7 g/dL (ref 30.0–36.0)
MCV: 83.1 fL (ref 78.0–100.0)
PLATELETS: 414 10*3/uL — AB (ref 150–400)
RBC: 3.5 MIL/uL — AB (ref 4.22–5.81)
RDW: 17.8 % — ABNORMAL HIGH (ref 11.5–15.5)
WBC: 24.6 10*3/uL — ABNORMAL HIGH (ref 4.0–10.5)

## 2017-04-30 LAB — PHOSPHORUS: PHOSPHORUS: 7.2 mg/dL — AB (ref 2.5–4.6)

## 2017-04-30 LAB — CBC WITH DIFFERENTIAL/PLATELET
BASOS ABS: 0 10*3/uL (ref 0.0–0.1)
BASOS PCT: 0 %
EOS ABS: 0 10*3/uL (ref 0.0–0.7)
Eosinophils Relative: 0 %
HEMATOCRIT: 29.1 % — AB (ref 39.0–52.0)
HEMOGLOBIN: 9.7 g/dL — AB (ref 13.0–17.0)
Lymphocytes Relative: 6 %
Lymphs Abs: 1.3 10*3/uL (ref 0.7–4.0)
MCH: 28 pg (ref 26.0–34.0)
MCHC: 33.3 g/dL (ref 30.0–36.0)
MCV: 83.9 fL (ref 78.0–100.0)
MONOS PCT: 2 %
Monocytes Absolute: 0.4 10*3/uL (ref 0.1–1.0)
NEUTROS ABS: 20.1 10*3/uL — AB (ref 1.7–7.7)
NEUTROS PCT: 92 %
Platelets: 361 10*3/uL (ref 150–400)
RBC: 3.47 MIL/uL — AB (ref 4.22–5.81)
RDW: 17.6 % — ABNORMAL HIGH (ref 11.5–15.5)
WBC: 21.9 10*3/uL — ABNORMAL HIGH (ref 4.0–10.5)

## 2017-04-30 LAB — COMPREHENSIVE METABOLIC PANEL
ALK PHOS: 81 U/L (ref 38–126)
ALT: 8 U/L — AB (ref 17–63)
AST: 14 U/L — ABNORMAL LOW (ref 15–41)
Albumin: 1.9 g/dL — ABNORMAL LOW (ref 3.5–5.0)
Anion gap: 14 (ref 5–15)
BILIRUBIN TOTAL: 0.7 mg/dL (ref 0.3–1.2)
BUN: 61 mg/dL — ABNORMAL HIGH (ref 6–20)
CALCIUM: 8.4 mg/dL — AB (ref 8.9–10.3)
CO2: 22 mmol/L (ref 22–32)
CREATININE: 4.38 mg/dL — AB (ref 0.61–1.24)
Chloride: 94 mmol/L — ABNORMAL LOW (ref 101–111)
GFR, EST AFRICAN AMERICAN: 17 mL/min — AB (ref >60)
GFR, EST NON AFRICAN AMERICAN: 15 mL/min — AB (ref >60)
Glucose, Bld: 166 mg/dL — ABNORMAL HIGH (ref 65–99)
Potassium: 4.1 mmol/L (ref 3.5–5.1)
Sodium: 130 mmol/L — ABNORMAL LOW (ref 135–145)
Total Protein: 5.8 g/dL — ABNORMAL LOW (ref 6.5–8.1)

## 2017-04-30 LAB — HEPARIN LEVEL (UNFRACTIONATED)
HEPARIN UNFRACTIONATED: 1.38 [IU]/mL — AB (ref 0.30–0.70)
Heparin Unfractionated: 1.46 IU/mL — ABNORMAL HIGH (ref 0.30–0.70)

## 2017-04-30 LAB — GLUCOSE, CAPILLARY
GLUCOSE-CAPILLARY: 157 mg/dL — AB (ref 65–99)
GLUCOSE-CAPILLARY: 163 mg/dL — AB (ref 65–99)
GLUCOSE-CAPILLARY: 164 mg/dL — AB (ref 65–99)
Glucose-Capillary: 147 mg/dL — ABNORMAL HIGH (ref 65–99)

## 2017-04-30 LAB — APTT
APTT: 70 s — AB (ref 24–36)
aPTT: >200 s (ref 24–36)
aPTT: 71 seconds — ABNORMAL HIGH (ref 24–36)

## 2017-04-30 LAB — MAGNESIUM: Magnesium: 2.2 mg/dL (ref 1.7–2.4)

## 2017-04-30 SURGERY — CARDIOVERSION
Anesthesia: General

## 2017-04-30 MED ORDER — PROPOFOL 10 MG/ML IV BOLUS
INTRAVENOUS | Status: DC | PRN
  Administered 2017-04-30: 80 mg via INTRAVENOUS

## 2017-04-30 MED ORDER — SODIUM CHLORIDE 0.9 % IV SOLN
INTRAVENOUS | Status: DC
  Administered 2017-04-30: 12:00:00 via INTRAVENOUS

## 2017-04-30 MED ORDER — ALTEPLASE 2 MG IJ SOLR: 2.0000 mg | Freq: Once | INTRAMUSCULAR | Status: DC | PRN

## 2017-04-30 MED ORDER — LIDOCAINE-PRILOCAINE 2.5-2.5 % EX CREA: 1.0000 "application " | TOPICAL_CREAM | CUTANEOUS | Status: DC | PRN

## 2017-04-30 MED ORDER — HEPARIN SODIUM (PORCINE) 1000 UNIT/ML DIALYSIS
1000.0000 [IU] | INTRAMUSCULAR | Status: DC | PRN
  Filled 2017-04-30: qty 1

## 2017-04-30 MED ORDER — SODIUM CHLORIDE 0.9 % IV SOLN: 100.0000 mL | INTRAVENOUS | Status: DC | PRN

## 2017-04-30 MED ORDER — HEPARIN SODIUM (PORCINE) 1000 UNIT/ML DIALYSIS: 20.0000 [IU]/kg | INTRAMUSCULAR | Status: DC | PRN

## 2017-04-30 MED ORDER — HEPARIN SODIUM (PORCINE) 1000 UNIT/ML DIALYSIS
20.0000 [IU]/kg | INTRAMUSCULAR | Status: DC | PRN
  Filled 2017-04-30: qty 4

## 2017-04-30 MED ORDER — HEPARIN SODIUM (PORCINE) 1000 UNIT/ML DIALYSIS: 1000.0000 [IU] | INTRAMUSCULAR | Status: DC | PRN

## 2017-04-30 MED ORDER — PENTAFLUOROPROP-TETRAFLUOROETH EX AERO: 1.0000 "application " | INHALATION_SPRAY | CUTANEOUS | Status: DC | PRN

## 2017-04-30 MED ORDER — LIDOCAINE HCL (PF) 1 % IJ SOLN: 5.0000 mL | INTRAMUSCULAR | Status: DC | PRN

## 2017-04-30 MED ORDER — LIDOCAINE-PRILOCAINE 2.5-2.5 % EX CREA
1.0000 "application " | TOPICAL_CREAM | CUTANEOUS | Status: DC | PRN
  Filled 2017-04-30: qty 5

## 2017-04-30 NOTE — Progress Notes (Signed)
ANTICOAGULATION CONSULT NOTE - Follow Up Consult  Pharmacy Consult for heparin infusion Indication: atrial fibrillation  No Known Allergies  Patient Measurements: Height: 6' (182.9 cm) Weight: (scale broken) IBW/kg (Calculated) : 77.6 Heparin Dosing Weight: 119.5 kg  Vital Signs: Temp: 97.7 F (36.5 C) (02/15 1254) Temp Source: Oral (02/15 1254) BP: 119/77 (02/15 1307) Pulse Rate: 73 (02/15 1307)  Labs: Recent Labs    04/28/17 0532  04/29/17 0443 04/29/17 2131 04/30/17 0458  HGB 10.5*  --  10.3*  --  9.7*  HCT 30.9*  --  30.1*  --  29.1*  PLT 334  --  359  --  361  APTT 58*   < > 55* 61* 70*  HEPARINUNFRC >2.20*  --  >2.20*  --  1.38*  CREATININE 4.09*  --  4.04*  --  4.38*   < > = values in this interval not displayed.    Estimated Creatinine Clearance: 35.6 mL/min (A) (by C-G formula based on SCr of 4.38 mg/dL (H)).   Medical History: Past Medical History:  Diagnosis Date  . Atrial flutter with rapid ventricular response ([place]) 08/06/2015   a. s/p DCCV in 07/2015 with recurrent atrial fibrillation and DCCV in 12/2016. Initially successful but noted to be back in atrial fibrillation within 2 weeks of DCCV.   [name] Chronic diastolic (congestive) heart failure ([facility])    a. EF 50-55% by echo in 06/2016.  . Diabetes ([place])   . Hypertension     Medications:  Scheduled:  . carvedilol  18.75 mg Oral BID WC  . colchicine  0.3 mg Oral Daily  . fluticasone  2 spray Each Nare Daily  . gabapentin  300 mg Oral BID  . insulin aspart  0-15 Units Subcutaneous TID WC  . insulin aspart  0-5 Units Subcutaneous QHS  . insulin glargine  25 Units Subcutaneous QHS  . predniSONE  60 mg Oral Q breakfast  . sodium chloride flush  3 mL Intravenous Q12H  . sodium chloride flush  3 mL Intravenous Q12H    Assessment: 46 yom on Xarelto PTA for Afib. Patient has marked volume overload with increasing Scr and decreasing urine output, despite lasix infusion and metolazone doses.   Last  dose of Xarelto was on 2/11 at 2154. Given recent Xarelto exposure, will evaluate both aPTT and heparin levels until correlate.   Now s/p successful cardioversion today.  PTT at goal on heparin at 2500 units/hr.  No overt bleeding or complications noted.  Goal of Therapy:  APTT level: 66-102 s Heparin level 0.3-0.7 units/ml Monitor platelets by anticoagulation protocol: Yes   Plan:  Continue heparin infusion at 2500 units/hr Repeat confirmatory PTT now. Daily PTT/heparin level. F/u plans to start Coumadin after permanent access placed.  Uvaldo Rising, BCPS  Clinical Pharmacist Pager [phone]  04/30/2017 2:10 PM

## 2017-04-30 NOTE — Progress Notes (Signed)
Dialysis diet reading material given to pt. Will continue to educate.

## 2017-04-30 NOTE — H&P (View-Only) (Signed)
Patient ID: John Bailey, male   DOB: [birth year], 47 y.o.   MRN: [id number] P    Advanced Heart Failure Rounding Note  PCP-Cardiologist: Pixie Casino, MD  HF Cardiologist: [name], MD  Subjective:    Amiodarone gtt restarted yesterday with afib/RVR.  HR in 100s-110s this morning.    He had HD yesterday and is getting HD again this morning for volume removal.   Started on prednisone yesterday.  Afebrile today.  Has not been out of bed with marked volume overload and knee pain.   Echo (04/25/17): EF 55-60%, severe LVH.   Objective:   Weight Range: (!) 386 lb 14.5 oz (175.5 kg) Body mass index is 52.47 kg/m.   Vital Signs:   Temp:  [97.8 F (36.6 C)-99.4 F (37.4 C)] 97.8 F (36.6 C) (02/15 0710) Pulse Rate:  [74-131] 105 (02/15 0830) Resp:  [11-27] 11 (02/15 0720) BP: (100-159)/(41-98) 144/73 (02/15 0830) SpO2:  [94 %-100 %] 98 % (02/15 0710) Last BM Date: 04/29/17  Weight change: Filed Weights   04/28/17 0300 04/29/17 0500  Weight: (!) 388 lb 7.2 oz (176.2 kg) (!) 386 lb 14.5 oz (175.5 kg)   Intake/Output:   Intake/Output Summary (Last 24 hours) at 04/30/2017 0901 Last data filed at 04/30/2017 0200 Gross per 24 hour  Intake 1641.42 ml  Output 4000 ml  Net -2358.58 ml     Physical Exam    General: NAD, obese Neck: JVP 14+ cm, no thyromegaly or thyroid nodule.  Lungs: Decreased breath sounds at bases.  CV: Nonpalpable PMI.  Heart mildly tachy, irregular S1/S2, no S3/S4, no murmur.  2+ edema to abdomen.   Abdomen: Soft, nontender, no hepatosplenomegaly, mild distention with abdominal wall edema.  Skin: Intact without lesions or rashes.  Neurologic: Alert and oriented x 3.  Psych: Normal affect. Extremities: No clubbing or cyanosis. Legs wrapped.  HEENT: Normal.    Telemetry   Afib 100s-110s, personally reviewed.    Labs    CBC Recent Labs    04/29/17 0443 04/30/17 0458  WBC 22.8* 21.9*  NEUTROABS 19.4* 20.1*  HGB 10.3* 9.7*  HCT 30.1*  29.1*  MCV 83.4 83.9  PLT 359 213   Basic Metabolic Panel Recent Labs    04/29/17 0443 04/30/17 0458  NA 132* 130*  K 4.3 4.1  CL 95* 94*  CO2 22 22  GLUCOSE 91 166*  BUN 64* 61*  CREATININE 4.04* 4.38*  CALCIUM 8.3* 8.4*  MG 2.2 2.2  PHOS 6.0* 7.2*   Liver Function Tests Recent Labs    04/29/17 0443 04/30/17 0458  AST 13* 14*  ALT 6* 8*  ALKPHOS 80 81  BILITOT 0.9 0.7  PROT 6.2* 5.8*  ALBUMIN 2.1* 1.9*   No results for input(s): LIPASE, AMYLASE in the last 72 hours. Cardiac Enzymes No results for input(s): CKTOTAL, CKMB, CKMBINDEX, TROPONINI in the last 72 hours.  BNP: BNP (last 3 results) Recent Labs    03/06/17 1800 04/22/17 1734 04/23/17 0736  BNP 791.5* 531.3* 627.9*    ProBNP (last 3 results) No results for input(s): PROBNP in the last 8760 hours.   D-Dimer No results for input(s): DDIMER in the last 72 hours. Hemoglobin A1C No results for input(s): HGBA1C in the last 72 hours. Fasting Lipid Panel No results for input(s): CHOL, HDL, LDLCALC, TRIG, CHOLHDL, LDLDIRECT in the last 72 hours. Thyroid Function Tests No results for input(s): TSH, T4TOTAL, T3FREE, THYROIDAB in the last 72 hours.  Invalid input(s): FREET3  Other  results:   Imaging    No results found.   Medications:     Scheduled Medications: . carvedilol  18.75 mg Oral BID WC  . colchicine  0.3 mg Oral Daily  . fluticasone  2 spray Each Nare Daily  . gabapentin  300 mg Oral BID  . insulin aspart  0-15 Units Subcutaneous TID WC  . insulin aspart  0-5 Units Subcutaneous QHS  . insulin glargine  25 Units Subcutaneous QHS  . predniSONE  60 mg Oral Q breakfast  . sodium chloride flush  3 mL Intravenous Q12H  . sodium chloride flush  3 mL Intravenous Q12H    Infusions: . sodium chloride    . sodium chloride    . sodium chloride    . sodium chloride    . amiodarone 30 mg/hr (04/29/17 2135)  . furosemide Stopped (04/30/17 0600)  . heparin 2,500 Units/hr (04/29/17  2329)    PRN Medications: sodium chloride, sodium chloride, sodium chloride, acetaminophen, alteplase, heparin, heparin, lidocaine (PF), lidocaine-prilocaine, methocarbamol, ondansetron (ZOFRAN) IV, pentafluoroprop-tetrafluoroeth, sodium chloride flush, sodium chloride flush, sodium chloride flush    Assessment/Plan   1. Atrial fibrillation with RVR: On review of prior ECGs, last time he was documented to be in NSR was in 1/18. I suspect that he may be better compensated in NSR. His HR has been difficult to control.  He was restarted yesterday on amiodarone gtt. He has been getting fluid off with HD prior to DCCV attempt.  - After HD today, will do DCCV.  We discussed risks/benefits and he agrees to proceed.  - He has been fully anticoagulated so no plan for TEE => had not missed Xarelto prior to admission and has been on heparin gtt after stopping Xarelto.  He will be transitioned to warfarin eventually once permanent access is placed.   - Continue Coreg 18.75 mg BID 2. Acute on chronic diastolic CHF: Echo this admission with severe LVH, EF 55-60%. Marked volume overload with anasarca. Fluid now coming off with HD but he has a long way to go.  - Continue HD per renal.   3. AKI on CKD Stage IV: I suspect that he will be dialysis dependent for volume management.  4. Fever: Afebrile today though WBCs high.  Cultures negative.  Suspicion for RA flare, high ESR and CRP.  He has been started on prednisone for management.  Knee pain improved.   5. HTN: Coming down with volume removal.  6. Knee pain: Likely related to rheumatoid arthritis. Knee Xray with subQ edema. No acute osseous findings.   Length of Stay: 8  [name], MD  04/30/2017, 9:01 AM  Advanced Heart Failure Team Pager [phone] (M-F; 7a - 4p)  Please contact [facility] for night-coverage after hours (4p -7a ) and weekends on [email or web address]

## 2017-04-30 NOTE — Progress Notes (Signed)
[facility] Progress Note    Assessment/ Plan:   Acute on chronic kidney disease: baseline 2.5-3, but acute increase in Cr to 3.65> 4.09>4.04.  -HD today  CHF: remains massively volume overloaded despite HD with UF of 3.5 liters yesterday. Goal weight is 285. Current weight 386 (down 2lbs from 2/14).   Afib with RVR per cards, planning DCCV when volume is removed and stable.  -DCCV today  HTN per primary  Fever with increasing WBCs overnight: question role of RA flare, patient with extremity pain. additionally with nasal congestion ?viral URI despite RVP negative. -monitor fever curve   Subjective:   On HD, resting. States large BM this morning. Feels swelling continues to improve.   Objective:   BP (!) 161/74   Pulse 95   Temp 97.8 F (36.6 C) (Oral)   Resp 11   Ht 6' (1.829 m)   Wt (!) 386 lb 14.5 oz (175.5 kg)   SpO2 98%   BMI 52.47 kg/m   Intake/Output Summary (Last 24 hours) at 04/30/2017 0916 Last data filed at 04/30/2017 0200 Gross per 24 hour  Intake 1641.42 ml  Output 4000 ml  Net -2358.58 ml   Weight change:   Physical Exam: Gen: morbidly obese male lying in bed in NAD CVS: irregular rhythm, no murmur Resp: breath sounds distant 2/2 body habitus, easy WOB, no crackles Abd: soft, distended, +BS faint Ext: 3+ edema from toes to sternum  Imaging: Dg Chest Port 1 View  Result Date: 04/29/2017 CLINICAL DATA:  Shortness of breath on exertion. Congestive heart failure. EXAM: PORTABLE CHEST 1 VIEW COMPARISON:  04/28/2017 FINDINGS: Right jugular central venous catheter remains in appropriate position. No pneumothorax visualized. Very low lung volumes are stable and there is no evidence of pulmonary consolidation or pleural effusion. Heart size is enlarged but also unchanged. IMPRESSION: Stable very low lung volumes, which limits evaluation. No evidence of pulmonary consolidation. Electronically Signed   By: Earle Gell M.D.   On: 04/29/2017 07:53    Dg Chest Port 1 View  Result Date: 04/28/2017 CLINICAL DATA:  Status post right central line placement. Shortness of breath. Chronic CHF, morbid obesity. EXAM: PORTABLE CHEST 1 VIEW COMPARISON:  Chest x-ray of February 7th 2019 FINDINGS: There has been interval placement of a right internal jugular venous catheter. The tip of the catheter projects over the proximal SVC. There is no postplacement pneumothorax. The cardiac silhouette remains enlarged. The pulmonary vascularity remains engorged and the interstitial markings increased. The left hemidiaphragm is obscured. IMPRESSION: No postprocedure complication following right internal jugular venous catheter placement. Cardiomegaly with mild pulmonary interstitial edema consistent with CHF. Electronically Signed   By: David  Martinique M.D.   On: 04/28/2017 12:53    Labs: BMET Recent Labs  Lab 04/25/17 0546 04/25/17 1521 04/26/17 0000 04/27/17 0640 04/28/17 0532 04/29/17 0443 04/30/17 0458  NA 136 134* 134* 131* 130* 132* 130*  K 6.0* 4.1 4.1 4.8 5.0 4.3 4.1  CL 102 99* 100* 96* 95* 95* 94*  CO2 19* 22 21* 21* 18* 22 22  GLUCOSE 126* 156* 143* 115* 91 91 166*  BUN 51* 49* 51* 63* 74* 64* 61*  CREATININE 2.59* 2.63* 2.85* 3.65* 4.09* 4.04* 4.38*  CALCIUM 8.5* 8.4* 8.1* 8.2* 8.3* 8.3* 8.4*  PHOS  --   --   --   --   --  6.0* 7.2*   CBC Recent Labs  Lab 04/27/17 0640 04/28/17 0532 04/29/17 0443 04/30/17 0458  WBC 21.5* 21.6* 22.8*  21.9*  NEUTROABS 18.7* 18.8* 19.4* 20.1*  HGB 9.5* 10.5* 10.3* 9.7*  HCT 29.0* 30.9* 30.1* 29.1*  MCV 86.1 85.1 83.4 83.9  PLT 267 334 359 361    Medications:    . carvedilol  18.75 mg Oral BID WC  . colchicine  0.3 mg Oral Daily  . fluticasone  2 spray Each Nare Daily  . gabapentin  300 mg Oral BID  . insulin aspart  0-15 Units Subcutaneous TID WC  . insulin aspart  0-5 Units Subcutaneous QHS  . insulin glargine  25 Units Subcutaneous QHS  . predniSONE  60 mg Oral Q breakfast  . sodium  chloride flush  3 mL Intravenous Q12H  . sodium chloride flush  3 mL Intravenous Q12H     Ralene Ok, MD 04/30/17  I have seen and examined this patient and agree with plan and assessment in the above note with renal recommendations/intervention highlighted.  Continue with daily dialysis and UF.  Will need vascular access placement once volume status has improved.  Governor Rooks Azya Barbero,MD 04/30/2017 4:17 PM

## 2017-04-30 NOTE — Transfer of Care (Signed)
Immediate Anesthesia Transfer of Care Note  Patient: JAKIN PAVAO  Procedure(s) Performed: CARDIOVERSION (N/A )  Patient Location: Endoscopy Unit  Anesthesia Type:MAC  Level of Consciousness: awake and alert   Airway & Oxygen Therapy: Patient Spontanous Breathing and Patient connected to nasal cannula oxygen  Post-op Assessment: Report given to RN and Post -op Vital signs reviewed and stable  Post vital signs: Reviewed and stable  Last Vitals:  Vitals:   04/30/17 1153 04/30/17 1254  BP: (!) 127/96 116/73  Pulse: (!) 116 74  Resp: 17 (!) 23  Temp: 36.6 C   SpO2: 98% 97%    Last Pain:  Vitals:   04/30/17 1254  TempSrc: Oral  PainSc:       Patients Stated Pain Goal: 2 (63/87/56 4332)  Complications: No apparent anesthesia complications

## 2017-04-30 NOTE — Interval H&P Note (Signed)
History and Physical Interval Note:  04/30/2017 12:40 PM  John Bailey  has presented today for surgery, with the diagnosis of a fib  The various methods of treatment have been discussed with the patient and family. After consideration of risks, benefits and other options for treatment, the patient has consented to  Procedure(s): CARDIOVERSION (N/A) as a surgical intervention .  The patient's history has been reviewed, patient examined, no change in status, stable for surgery.  I have reviewed the patient's chart and labs.  Questions were answered to the patient's satisfaction.     [place] [employer]

## 2017-04-30 NOTE — Anesthesia Postprocedure Evaluation (Signed)
Anesthesia Post Note  Patient: John Bailey  Procedure(s) Performed: CARDIOVERSION (N/A )     Patient location during evaluation: Endoscopy Anesthesia Type: General Level of consciousness: awake and alert Pain management: pain level controlled Vital Signs Assessment: post-procedure vital signs reviewed and stable Respiratory status: spontaneous breathing, nonlabored ventilation, respiratory function stable and patient connected to nasal cannula oxygen Cardiovascular status: blood pressure returned to baseline and stable Postop Assessment: no apparent nausea or vomiting Anesthetic complications: no    Last Vitals:  Vitals:   04/30/17 1300 04/30/17 1307  BP: 118/69 119/77  Pulse: 73 73  Resp: 18 20  Temp:    SpO2: 99% 100%    Last Pain:  Vitals:   04/30/17 1254  TempSrc: Oral  PainSc:                  Tullio Chausse,JAMES TERRILL

## 2017-04-30 NOTE — Progress Notes (Signed)
Patient ID: John Bailey, male   DOB: 02-02-1971, 47 y.o.   MRN: [id number] P    Advanced Heart Failure Rounding Note  PCP-Cardiologist: Pixie Casino, MD  HF Cardiologist: [name], MD  Subjective:    Amiodarone gtt restarted yesterday with afib/RVR.  HR in 100s-110s this morning.    He had HD yesterday and is getting HD again this morning for volume removal.   Started on prednisone yesterday.  Afebrile today.  Has not been out of bed with marked volume overload and knee pain.   Echo (04/25/17): EF 55-60%, severe LVH.   Objective:   Weight Range: (!) 386 lb 14.5 oz (175.5 kg) Body mass index is 52.47 kg/m.   Vital Signs:   Temp:  [97.8 F (36.6 C)-99.4 F (37.4 C)] 97.8 F (36.6 C) (02/15 0710) Pulse Rate:  [74-131] 105 (02/15 0830) Resp:  [11-27] 11 (02/15 0720) BP: (100-159)/(41-98) 144/73 (02/15 0830) SpO2:  [94 %-100 %] 98 % (02/15 0710) Last BM Date: 04/29/17  Weight change: Filed Weights   04/28/17 0300 04/29/17 0500  Weight: (!) 388 lb 7.2 oz (176.2 kg) (!) 386 lb 14.5 oz (175.5 kg)   Intake/Output:   Intake/Output Summary (Last 24 hours) at 04/30/2017 0901 Last data filed at 04/30/2017 0200 Gross per 24 hour  Intake 1641.42 ml  Output 4000 ml  Net -2358.58 ml     Physical Exam    General: NAD, obese Neck: JVP 14+ cm, no thyromegaly or thyroid nodule.  Lungs: Decreased breath sounds at bases.  CV: Nonpalpable PMI.  Heart mildly tachy, irregular S1/S2, no S3/S4, no murmur.  2+ edema to abdomen.   Abdomen: Soft, nontender, no hepatosplenomegaly, mild distention with abdominal wall edema.  Skin: Intact without lesions or rashes.  Neurologic: Alert and oriented x 3.  Psych: Normal affect. Extremities: No clubbing or cyanosis. Legs wrapped.  HEENT: Normal.    Telemetry   Afib 100s-110s, personally reviewed.    Labs    CBC Recent Labs    04/29/17 0443 04/30/17 0458  WBC 22.8* 21.9*  NEUTROABS 19.4* 20.1*  HGB 10.3* 9.7*  HCT 30.1*  29.1*  MCV 83.4 83.9  PLT 359 315   Basic Metabolic Panel Recent Labs    04/29/17 0443 04/30/17 0458  NA 132* 130*  K 4.3 4.1  CL 95* 94*  CO2 22 22  GLUCOSE 91 166*  BUN 64* 61*  CREATININE 4.04* 4.38*  CALCIUM 8.3* 8.4*  MG 2.2 2.2  PHOS 6.0* 7.2*   Liver Function Tests Recent Labs    04/29/17 0443 04/30/17 0458  AST 13* 14*  ALT 6* 8*  ALKPHOS 80 81  BILITOT 0.9 0.7  PROT 6.2* 5.8*  ALBUMIN 2.1* 1.9*   No results for input(s): LIPASE, AMYLASE in the last 72 hours. Cardiac Enzymes No results for input(s): CKTOTAL, CKMB, CKMBINDEX, TROPONINI in the last 72 hours.  BNP: BNP (last 3 results) Recent Labs    03/06/17 1800 04/22/17 1734 04/23/17 0736  BNP 791.5* 531.3* 627.9*    ProBNP (last 3 results) No results for input(s): PROBNP in the last 8760 hours.   D-Dimer No results for input(s): DDIMER in the last 72 hours. Hemoglobin A1C No results for input(s): HGBA1C in the last 72 hours. Fasting Lipid Panel No results for input(s): CHOL, HDL, LDLCALC, TRIG, CHOLHDL, LDLDIRECT in the last 72 hours. Thyroid Function Tests No results for input(s): TSH, T4TOTAL, T3FREE, THYROIDAB in the last 72 hours.  Invalid input(s): FREET3  Other  results:   Imaging    No results found.   Medications:     Scheduled Medications: . carvedilol  18.75 mg Oral BID WC  . colchicine  0.3 mg Oral Daily  . fluticasone  2 spray Each Nare Daily  . gabapentin  300 mg Oral BID  . insulin aspart  0-15 Units Subcutaneous TID WC  . insulin aspart  0-5 Units Subcutaneous QHS  . insulin glargine  25 Units Subcutaneous QHS  . predniSONE  60 mg Oral Q breakfast  . sodium chloride flush  3 mL Intravenous Q12H  . sodium chloride flush  3 mL Intravenous Q12H    Infusions: . sodium chloride    . sodium chloride    . sodium chloride    . sodium chloride    . amiodarone 30 mg/hr (04/29/17 2135)  . furosemide Stopped (04/30/17 0600)  . heparin 2,500 Units/hr (04/29/17  2329)    PRN Medications: sodium chloride, sodium chloride, sodium chloride, acetaminophen, alteplase, heparin, heparin, lidocaine (PF), lidocaine-prilocaine, methocarbamol, ondansetron (ZOFRAN) IV, pentafluoroprop-tetrafluoroeth, sodium chloride flush, sodium chloride flush, sodium chloride flush    Assessment/Plan   1. Atrial fibrillation with RVR: On review of prior ECGs, last time he was documented to be in NSR was in 1/18. I suspect that he may be better compensated in NSR. His HR has been difficult to control.  He was restarted yesterday on amiodarone gtt. He has been getting fluid off with HD prior to DCCV attempt.  - After HD today, will do DCCV.  We discussed risks/benefits and he agrees to proceed.  - He has been fully anticoagulated so no plan for TEE => had not missed Xarelto prior to admission and has been on heparin gtt after stopping Xarelto.  He will be transitioned to warfarin eventually once permanent access is placed.   - Continue Coreg 18.75 mg BID 2. Acute on chronic diastolic CHF: Echo this admission with severe LVH, EF 55-60%. Marked volume overload with anasarca. Fluid now coming off with HD but he has a long way to go.  - Continue HD per renal.   3. AKI on CKD Stage IV: I suspect that he will be dialysis dependent for volume management.  4. Fever: Afebrile today though WBCs high.  Cultures negative.  Suspicion for RA flare, high ESR and CRP.  He has been started on prednisone for management.  Knee pain improved.   5. HTN: Coming down with volume removal.  6. Knee pain: Likely related to rheumatoid arthritis. Knee Xray with subQ edema. No acute osseous findings.   Length of Stay: 8  [name], MD  04/30/2017, 9:01 AM  Advanced Heart Failure Team Pager [phone] (M-F; 7a - 4p)  Please contact [facility] for night-coverage after hours (4p -7a ) and weekends on [email or web address]

## 2017-04-30 NOTE — Progress Notes (Signed)
ANTICOAGULATION CONSULT NOTE - Follow Up Consult  Pharmacy Consult for heparin infusion Indication: atrial fibrillation  No Known Allergies  Patient Measurements: Height: 6' (182.9 cm) Weight: (scale broken) IBW/kg (Calculated) : 77.6 Heparin Dosing Weight: 119.5 kg  Vital Signs: Temp: 97.5 F (36.4 C) (02/15 1930) Temp Source: Oral (02/15 1930) BP: 115/77 (02/15 1930) Pulse Rate: 72 (02/15 1930)  Labs: Recent Labs    04/28/17 0532  04/29/17 0443  04/30/17 0458 04/30/17 1826 04/30/17 1839 04/30/17 2044  HGB 10.5*  --  10.3*  --  9.7*  --   --  9.8*  HCT 30.9*  --  30.1*  --  29.1*  --   --  29.1*  PLT 334  --  359  --  361  --   --  414*  APTT 58*   < > 55*   < > 70*  --  >200* 71*  HEPARINUNFRC >2.20*  --  >2.20*  --  1.38* 1.46*  --   --   CREATININE 4.09*  --  4.04*  --  4.38*  --   --   --    < > = values in this interval not displayed.    Estimated Creatinine Clearance: 35.6 mL/min (A) (by C-G formula based on SCr of 4.38 mg/dL (H)).   Medical History: Past Medical History:  Diagnosis Date  . Atrial flutter with rapid ventricular response ([place]) 08/06/2015   a. s/p DCCV in 07/2015 with recurrent atrial fibrillation and DCCV in 12/2016. Initially successful but noted to be back in atrial fibrillation within 2 weeks of DCCV.   [name] Chronic diastolic (congestive) heart failure ([facility])    a. EF 50-55% by echo in 06/2016.  . Diabetes ([place])   . Hypertension     Medications:  Scheduled:  . carvedilol  18.75 mg Oral BID WC  . colchicine  0.3 mg Oral Daily  . fluticasone  2 spray Each Nare Daily  . gabapentin  300 mg Oral BID  . insulin aspart  0-15 Units Subcutaneous TID WC  . insulin aspart  0-5 Units Subcutaneous QHS  . insulin glargine  25 Units Subcutaneous QHS  . predniSONE  60 mg Oral Q breakfast  . sodium chloride flush  3 mL Intravenous Q12H  . sodium chloride flush  3 mL Intravenous Q12H    Assessment: 46 yom on Xarelto PTA for Afib. Patient has  marked volume overload with increasing Scr and decreasing urine output, despite lasix infusion and metolazone doses.   Last dose of Xarelto was on 2/11 at 2154. Given recent Xarelto exposure, will evaluate both aPTT and heparin levels until correlate.   Now s/p successful cardioversion today.  PTT at goal on heparin at 2500 units/hr.  No overt bleeding or complications noted.  Repeat aPTT remains therapeutic at 71 seconds, repeat CBC stable.  Goal of Therapy:  APTT level: 66-102 s Heparin level 0.3-0.7 units/ml Monitor platelets by anticoagulation protocol: Yes   Plan:  Continue heparin infusion at 2500 units/hr Daily PTT/heparin level. F/u plans to start Coumadin after permanent access placed.  Arrie Senate, PharmD, BCPS PGY-2 Cardiology Pharmacy Resident Pager: [phone] 04/30/2017

## 2017-04-30 NOTE — Procedures (Signed)
Electrical Cardioversion Procedure Note John Bailey [id number] 30-Nov-1970  Procedure: Electrical Cardioversion Indications:  Atrial Fibrillation  Procedure Details Consent: Risks of procedure as well as the alternatives and risks of each were explained to the (patient/caregiver).  Consent for procedure obtained. Time Out: Verified patient identification, verified procedure, site/side was marked, verified correct patient position, special equipment/implants available, medications/allergies/relevent history reviewed, required imaging and test results available.  Performed  Patient placed on cardiac monitor, pulse oximetry, supplemental oxygen as necessary.  Sedation given: Propofol per anesthesiology Pacer pads placed anterior and posterior chest.  Cardioverted 1 time(s).  Cardioverted at [place].  Evaluation Findings: Post procedure EKG shows: NSR Complications: None Patient did tolerate procedure well.   John Bailey 04/30/2017, 12:45 PM

## 2017-04-30 NOTE — Progress Notes (Signed)
PT Cancellation Note  Patient Details Name: John Bailey MRN: [id number] DOB: Dec 26, 1970   Cancelled Treatment:    Reason Eval/Treat Not Completed: Patient at procedure or test/unavailable. Pt off of the floor for HD. PT will continue to f/u with pt as available.   [place] 04/30/2017, 7:32 AM

## 2017-04-30 NOTE — Anesthesia Preprocedure Evaluation (Addendum)
Anesthesia Evaluation  Patient identified by MRN, date of birth, ID band Patient awake    Reviewed: Allergy & Precautions, NPO status   Airway Mallampati: II  TM Distance: >3 FB Neck ROM: Full    Dental no notable dental hx.    Pulmonary shortness of breath,   Distant BS   + decreased breath sounds      Cardiovascular hypertension, +CHF, + PND and + DOE  + dysrhythmias  Rhythm:Irregular Rate:Normal     Neuro/Psych    GI/Hepatic   Endo/Other  diabetesMorbid obesityMassive obesity  Renal/GU CRFRenal disease     Musculoskeletal   Abdominal (+) + obese,   Peds  Hematology  (+) anemia ,   Anesthesia Other Findings   Reproductive/Obstetrics                            Anesthesia Physical Anesthesia Plan  ASA: IV  Anesthesia Plan: General   Post-op Pain Management:    Induction: Intravenous  PONV Risk Score and Plan: 1  Airway Management Planned: Mask  Additional Equipment:   Intra-op Plan:   Post-operative Plan: Possible Post-op intubation/ventilation  Informed Consent: I have reviewed the patients History and Physical, chart, labs and discussed the procedure including the risks, benefits and alternatives for the proposed anesthesia with the patient or authorized representative who has indicated his/her understanding and acceptance.     Plan Discussed with: CRNA  Anesthesia Plan Comments:        Anesthesia Quick Evaluation

## 2017-04-30 NOTE — Progress Notes (Signed)
RN received call from lab stating PTT was >200.  RN contacted pharmacy due to pharmacy managing heparin drip per previous MD order.  Spoke with Pharmacist, Lattie Haw whom will enter orders.

## 2017-04-30 NOTE — Procedures (Signed)
I was present at this dialysis session. I have reviewed the session itself and made appropriate changes.   Filed Weights   04/28/17 0300 04/29/17 0500  Weight: (!) 176.2 kg (388 lb 7.2 oz) (!) 175.5 kg (386 lb 14.5 oz)    Recent Labs  Lab 04/30/17 0458  NA 130*  K 4.1  CL 94*  CO2 22  GLUCOSE 166*  BUN 61*  CREATININE 4.38*  CALCIUM 8.4*  PHOS 7.2*    Recent Labs  Lab 04/28/17 0532 04/29/17 0443 04/30/17 0458  WBC 21.6* 22.8* 21.9*  NEUTROABS 18.8* 19.4* 20.1*  HGB 10.5* 10.3* 9.7*  HCT 30.9* 30.1* 29.1*  MCV 85.1 83.4 83.9  PLT 334 359 361    Scheduled Meds: . carvedilol  18.75 mg Oral BID WC  . colchicine  0.3 mg Oral Daily  . fluticasone  2 spray Each Nare Daily  . gabapentin  300 mg Oral BID  . insulin aspart  0-15 Units Subcutaneous TID WC  . insulin aspart  0-5 Units Subcutaneous QHS  . insulin glargine  25 Units Subcutaneous QHS  . predniSONE  60 mg Oral Q breakfast  . sodium chloride flush  3 mL Intravenous Q12H  . sodium chloride flush  3 mL Intravenous Q12H   Continuous Infusions: . sodium chloride    . sodium chloride    . sodium chloride    . sodium chloride    . amiodarone 30 mg/hr (04/29/17 2135)  . furosemide Stopped (04/30/17 0600)  . heparin 2,500 Units/hr (04/29/17 2329)   PRN Meds:.sodium chloride, sodium chloride, sodium chloride, acetaminophen, alteplase, heparin, heparin, lidocaine (PF), lidocaine-prilocaine, methocarbamol, ondansetron (ZOFRAN) IV, pentafluoroprop-tetrafluoroeth, sodium chloride flush, sodium chloride flush, sodium chloride flush    Continue with aggressive UF with daily HD.  For cardioversion attempt today after HD.  Donetta Potts,  MD 04/30/2017, 8:52 AM

## 2017-04-30 NOTE — Progress Notes (Signed)
PROGRESS NOTE    John Bailey  OZY:[id number] DOB: 09/12/1970 DOA: 04/22/2017 PCP: [name], MD   Brief Narrative:  John Bailey is a 47 y.o.with a PMH of rheumatoid arthritis, diabetes mellitus, heart failure, atrial fibrillation. Patient presented with dyspnea and found to have a CHF exacerbation. Diuresed with worsening kidney function. Now has Fever and new leukocytosis. Infectious workup so far is unremarkable. Discussed case with ID and patient's Rheumatologist and will start patient on po Prednisone. Patient dialyzed again this AM and was taken for a Successful DCCV this Afternoon.  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  -Weight is now down 2 lbs setting of IV diuresis and Dialysis and is now 386 but was not weighed today  -Echocardiogram significant for an EF of 55-60% and severe LVH with dilated left atrium and trivial pericardial effusion; unable to assess diastolic dysfunction -Cardiology recommendations: Given IV Lasix 160 mg bolus q6h with poor UOP and worsening Cr -C/w Strict I's/O's, Daily Weights, SLIV  -Patient is -21,582 Liters  -Trialysis Cath placed 2/12/18and will undergo Dialysis daily and may come Dialysis Dependent   Anasarca -Secondary to fluid overload. -Management above  Rheumatoid Arthritis Flare -Patient on Abatacept every Tuesday.  -Knee x-rays negative for knee effusion -Holding Abatacept in setting of possible infection -Bilateral knee x-ray  Negative -C/w Methocarbamol q8hprn  -C/w Gabapentin 300 mg po BID  -Discussed with patient's outpatient Rheumatologist Dr. [name] and recommending po Prednisone and tapering; Started 60 mg po Prednisone  Atrial fibrillation with RVR s/p DCCV Cardioversion and Now in RVR -Rate uncontrolled today. C/w Heparin gtt for now; Was on Xarelto as an outpatient and will go home on Coumadin at D/C -Back On Amiodarone gtt per Cardiology; po Amiodarone 200 mg po BID. Now D/C'd -Will transfer to SDU given high risk  -C/w Carvedilol 18.75 mg po BID  -DCCV done today and converted to NSR  AKI on CKD Stage IV -Worsening and now ESRD -Per Nephro will get daily dialysis daily and likley take a break on Sunday -BUN/Cr went from 64/4.04 -> 61/4.38 -Discussed with Dr. Marval Regal and we will consult VVS for Vascular Access Assessment/Placement  Hyperkalemia, improved -In the setting of worsening Kidney Disease. -K+ now 4.1 -Continue to monitor daily CMP's  SIRS suspect RA Flare vs Gouty Flare or Combination -Unknown etiology but likely combination of RA Flare and Gout Flare   -Some Tachycardia in setting of atrial fibrillation but probably worse from Pain -Otherwise, hemodynamically stable.  -Infectious workup so far has been negative. Patient with history of RA and likley this could be a flair.  -Kidney function worsening and would not start NSAIDs.  -Discussed with patient about starting steroids and will start Prednisone 60 mg po Daily and Taper   -Repeat Urinalysis and obtain urine culture in setting of AKI pending  -Blood Cx x2 show NGTD at 4 days  -CXR yesterday AM showed Stable very low lung volumes, which limits evaluation. No evidence of pulmonary consolidation. -Patient A Febrile today  -ESR  was 110 and CRP was 34.1 -Checked Uric Acid Level and was 14.2 -C/w Colchicine per Nephrology  -WBC went from 8.8 -> 21.6 -> 22.8 -> 21.9 -Discussed with Infectious Diseases Dr. Tommy Medal who feels  Fever and WBC is related to RA -Discussed with patient's Rheumatologist Dr. [name] who recommended starting po Prednisone and continuing to hold Abatacept   Diabetes Mellitus  -C/w Lantus 25 units sq qHS and with Moderate Novolog SSI AC/HS -CBG's ranging from 147-163 and Continue to Monitor   DVT prophylaxis: Anticoagulated with Heparin gtt Code Status: FULL CODE Family Communication: No family present at bedside Disposition Plan: SNF when medically stable to D/C   Consultants:   Cardiology Heart Failure Team  Nephrology  Interventional Radiology  Discussed Case with ID Dr. Tommy Medal  Discussed Case with Patient's Primary Rheumatologist Dr. [name]    Procedures:  ECHOCARDIOGRAM 04/25/17 ------------------------------------------------------------------- Study Conclusions  - Left ventricle: The cavity size was normal. Wall thickness was   increased in a pattern of severe LVH. Systolic function was   normal. The estimated ejection fraction was in the range of 55%   to 60%. The study is not technically sufficient to allow   evaluation of LV diastolic function. - Mitral valve: There was mild regurgitation. - Left atrium: The atrium was moderately dilated. - Pericardium, extracardiac: A trivial pericardial effusion was   identified posterior to the heart.  Antimicrobials:  Anti-infectives (From admission, onward)   None     Subjective: Seen and examined in Dialysis and felt less swollen and pain was improved. No CP or SOB. Anxious about Cardioversion. No other concerns or complaints at this time.   Objective: Vitals:   04/30/17 1300 04/30/17 1307 04/30/17 1500 04/30/17 1633  BP: 118/69 119/77 115/75 121/82  Pulse: 73 73  75  Resp: 18 20    Temp:      TempSrc:      SpO2: 99% 100%    Weight:      Height:        Intake/Output Summary (Last 24 hours) at 04/30/2017 1917 Last data filed at 04/30/2017 1700 Gross per 24 hour  Intake 968.45 ml  Output 5000 ml    Net -4031.55 ml   Filed Weights   04/28/17 0300 04/29/17 0500  Weight: (!) 176.2 kg (388 lb 7.2 oz) (!) 175.5 kg (386 lb 14.5 oz)   Examination: Physical Exam:  Constitutional: WN/WD obese AAM in NAD Eyes: Sclerae anicteric. Lids normal ENMT: External Ears and nose appear normal. Grossly normal hearing Neck: Supple; Trialysis in place and has some JVD Respiratory: Diminished to auscultation bilaterally; No appreciable wheezing/rales/rhonchi; Unlabored breathing  Cardiovascular: Irregularly Irregular and slightly Tachycardic. 2-3+ LE Edema and is anasarca Abdomen: Soft, NT, Distended. Bowel sounds present GU: Deferred Musculoskeletal: No contractures; No cyanosis Skin: Warm and Dry; LE bandaged Neurologic: CN 2-12 grossly intact. Romberg Sign and Cerebellar Reflexes not assessed Psychiatric: Slightly anxious mood and affect. Awake and alert  Data Reviewed: I have personally reviewed following labs and imaging studies  CBC: Recent Labs  Lab 04/27/17 0640 04/28/17 0532 04/29/17 0443 04/30/17 0458  WBC 21.5* 21.6* 22.8* 21.9*  NEUTROABS 18.7* 18.8* 19.4* 20.1*  HGB 9.5* 10.5* 10.3* 9.7*  HCT 29.0* 30.9* 30.1* 29.1*  MCV 86.1 85.1 83.4 83.9  PLT 267 334 359 235   Basic Metabolic Panel: Recent Labs  Lab 04/26/17 0000 04/27/17 0640 04/28/17 0532 04/29/17 0443 04/30/17 0458  NA 134* 131* 130* 132* 130*  K 4.1 4.8 5.0 4.3  4.1  CL 100* 96* 95* 95* 94*  CO2 21* 21* 18* 22 22  GLUCOSE 143* 115* 91 91 166*  BUN 51* 63* 74* 64* 61*  CREATININE 2.85* 3.65* 4.09* 4.04* 4.38*  CALCIUM 8.1* 8.2* 8.3* 8.3* 8.4*  MG  --   --   --  2.2 2.2  PHOS  --   --   --  6.0* 7.2*   GFR: Estimated Creatinine Clearance: 35.6 mL/min (A) (by C-G formula based on SCr of 4.38 mg/dL (H)). Liver Function Tests: Recent Labs  Lab 04/29/17 0443 04/30/17 0458  AST 13* 14*  ALT 6* 8*  ALKPHOS 80 81  BILITOT 0.9 0.7  PROT 6.2* 5.8*  ALBUMIN 2.1* 1.9*   No results for input(s): LIPASE,  AMYLASE in the last 168 hours. No results for input(s): AMMONIA in the last 168 hours. Coagulation Profile: No results for input(s): INR, PROTIME in the last 168 hours. Cardiac Enzymes: No results for input(s): CKTOTAL, CKMB, CKMBINDEX, TROPONINI in the last 168 hours. BNP (last 3 results) No results for input(s): PROBNP in the last 8760 hours. HbA1C: No results for input(s): HGBA1C in the last 72 hours. CBG: Recent Labs  Lab 04/29/17 1704 04/29/17 2114 04/29/17 2358 04/30/17 1048 04/30/17 1632  GLUCAP 65 145* 163* 147* 157*   Lipid Profile: No results for input(s): CHOL, HDL, LDLCALC, TRIG, CHOLHDL, LDLDIRECT in the last 72 hours. Thyroid Function Tests: No results for input(s): TSH, T4TOTAL, FREET4, T3FREE, THYROIDAB in the last 72 hours. Anemia Panel: No results for input(s): VITAMINB12, FOLATE, FERRITIN, TIBC, IRON, RETICCTPCT in the last 72 hours. Sepsis Labs: No results for input(s): PROCALCITON, LATICACIDVEN in the last 168 hours.  Recent Results (from the past 240 hour(s))  Respiratory Panel by PCR     Status: None   Collection Time: 04/25/17 10:50 AM  Result Value Ref Range Status   Adenovirus NOT DETECTED NOT DETECTED Final   Coronavirus 229E NOT DETECTED NOT DETECTED Final   Coronavirus HKU1 NOT DETECTED NOT DETECTED Final   Coronavirus NL63 NOT DETECTED NOT DETECTED Final   Coronavirus OC43 NOT DETECTED NOT DETECTED Final   Metapneumovirus NOT DETECTED NOT DETECTED Final   Rhinovirus / Enterovirus NOT DETECTED NOT DETECTED Final   Influenza A NOT DETECTED NOT DETECTED Final   Influenza A H1 NOT DETECTED NOT DETECTED Final   Influenza A H1 2009 NOT DETECTED NOT DETECTED Final   Influenza A H3 NOT DETECTED NOT DETECTED Final   Influenza B NOT DETECTED NOT DETECTED Final   Parainfluenza Virus 1 NOT DETECTED NOT DETECTED Final   Parainfluenza Virus 2 NOT DETECTED NOT DETECTED Final   Parainfluenza Virus 3 NOT DETECTED NOT DETECTED Final   Parainfluenza Virus 4  NOT DETECTED NOT DETECTED Final   Respiratory Syncytial Virus NOT DETECTED NOT DETECTED Final   Bordetella pertussis NOT DETECTED NOT DETECTED Final   Chlamydophila pneumoniae NOT DETECTED NOT DETECTED Final   Mycoplasma pneumoniae NOT DETECTED NOT DETECTED Final    Comment: Performed at [facility], [address], [place], [place] [address]  Culture, blood (routine x 2)     Status: None (Preliminary result)   Collection Time: 04/26/17 12:10 AM  Result Value Ref Range Status   Specimen Description BLOOD LEFT HAND  Final   Special Requests IN PEDIATRIC BOTTLE Blood Culture adequate volume  Final   Culture   Final    NO GROWTH 4 DAYS Performed at [facility], [place] [address], [place], [place]  [address]    Report Status PENDING  Incomplete  Culture, blood (routine x 2)     Status: None (Preliminary result)   Collection Time: 04/26/17 12:18 AM  Result Value Ref Range Status   Specimen Description BLOOD LEFT HAND  Final   Special Requests IN PEDIATRIC BOTTLE Blood Culture adequate volume  Final   Culture   Final    NO GROWTH 4 DAYS Performed at [facility], [place] [address] [place], [place] [address]    Report Status PENDING  Incomplete    Radiology Studies: Dg Chest Port 1 View  Result Date: 04/29/2017 CLINICAL DATA:  Shortness of breath on exertion. Congestive heart failure. EXAM: PORTABLE CHEST 1 VIEW COMPARISON:  04/28/2017 FINDINGS: Right jugular central venous catheter remains in appropriate position. No pneumothorax visualized. Very low lung volumes are stable and there is no evidence of pulmonary consolidation or pleural effusion. Heart size is enlarged but also unchanged. IMPRESSION: Stable very low lung volumes, which limits evaluation. No evidence of pulmonary consolidation. Electronically Signed   By: Earle Gell M.D.   On: 04/29/2017 07:53   Scheduled Meds: . carvedilol  18.75 mg Oral BID WC  . colchicine  0.3 mg Oral Daily  . fluticasone  2 spray Each  Nare Daily  . gabapentin  300 mg Oral BID  . insulin aspart  0-15 Units Subcutaneous TID WC  . insulin aspart  0-5 Units Subcutaneous QHS  . insulin glargine  25 Units Subcutaneous QHS  . predniSONE  60 mg Oral Q breakfast  . sodium chloride flush  3 mL Intravenous Q12H  . sodium chloride flush  3 mL Intravenous Q12H   Continuous Infusions: . sodium chloride    . sodium chloride    . sodium chloride    . sodium chloride    . amiodarone 30 mg/hr (04/30/17 0900)  . furosemide Stopped (04/30/17 1808)  . heparin 2,500 Units/hr (04/29/17 2329)    LOS: 8 days   Kerney Elbe, DO [facility] Pager [phone]  If 7PM-7AM, please contact night-coverage [email or web address] Password [facility] 04/30/2017, 7:17 PM

## 2017-05-01 LAB — RENAL FUNCTION PANEL
Albumin: 2 g/dL — ABNORMAL LOW (ref 3.5–5.0)
Anion gap: 15 (ref 5–15)
BUN: 61 mg/dL — ABNORMAL HIGH (ref 6–20)
CO2: 22 mmol/L (ref 22–32)
Calcium: 8.6 mg/dL — ABNORMAL LOW (ref 8.9–10.3)
Chloride: 94 mmol/L — ABNORMAL LOW (ref 101–111)
Creatinine, Ser: 4.4 mg/dL — ABNORMAL HIGH (ref 0.61–1.24)
GFR calc Af Amer: 17 mL/min — ABNORMAL LOW (ref >60)
GFR calc non Af Amer: 15 mL/min — ABNORMAL LOW (ref >60)
Glucose, Bld: 183 mg/dL — ABNORMAL HIGH (ref 65–99)
Phosphorus: 7.1 mg/dL — ABNORMAL HIGH (ref 2.5–4.6)
Potassium: 3.6 mmol/L (ref 3.5–5.1)
Sodium: 131 mmol/L — ABNORMAL LOW (ref 135–145)

## 2017-05-01 LAB — CBC WITH DIFFERENTIAL/PLATELET
BASOS ABS: 0 10*3/uL (ref 0.0–0.1)
Basophils Relative: 0 %
Eosinophils Absolute: 0 10*3/uL (ref 0.0–0.7)
Eosinophils Relative: 0 %
HEMATOCRIT: 28.2 % — AB (ref 39.0–52.0)
HEMOGLOBIN: 9.5 g/dL — AB (ref 13.0–17.0)
LYMPHS PCT: 13 %
Lymphs Abs: 3.2 10*3/uL (ref 0.7–4.0)
MCH: 27.9 pg (ref 26.0–34.0)
MCHC: 33.7 g/dL (ref 30.0–36.0)
MCV: 82.7 fL (ref 78.0–100.0)
Monocytes Absolute: 0.7 10*3/uL (ref 0.1–1.0)
Monocytes Relative: 3 %
NEUTROS ABS: 21 10*3/uL — AB (ref 1.7–7.7)
NEUTROS PCT: 84 %
PLATELETS: 466 10*3/uL — AB (ref 150–400)
RBC: 3.41 MIL/uL — ABNORMAL LOW (ref 4.22–5.81)
RDW: 17.5 % — ABNORMAL HIGH (ref 11.5–15.5)
WBC: 24.9 10*3/uL — AB (ref 4.0–10.5)

## 2017-05-01 LAB — COMPREHENSIVE METABOLIC PANEL
ALBUMIN: 2 g/dL — AB (ref 3.5–5.0)
ALK PHOS: 80 U/L (ref 38–126)
ALT: 7 U/L — ABNORMAL LOW (ref 17–63)
ANION GAP: 15 (ref 5–15)
AST: 10 U/L — ABNORMAL LOW (ref 15–41)
BUN: 60 mg/dL — ABNORMAL HIGH (ref 6–20)
CALCIUM: 8.6 mg/dL — AB (ref 8.9–10.3)
CO2: 22 mmol/L (ref 22–32)
Chloride: 94 mmol/L — ABNORMAL LOW (ref 101–111)
Creatinine, Ser: 4.4 mg/dL — ABNORMAL HIGH (ref 0.61–1.24)
GFR calc Af Amer: 17 mL/min — ABNORMAL LOW (ref >60)
GFR calc non Af Amer: 15 mL/min — ABNORMAL LOW (ref >60)
Glucose, Bld: 177 mg/dL — ABNORMAL HIGH (ref 65–99)
Potassium: 3.6 mmol/L (ref 3.5–5.1)
Sodium: 131 mmol/L — ABNORMAL LOW (ref 135–145)
Total Bilirubin: 0.8 mg/dL (ref 0.3–1.2)
Total Protein: 6.2 g/dL — ABNORMAL LOW (ref 6.5–8.1)

## 2017-05-01 LAB — APTT: aPTT: 92 seconds — ABNORMAL HIGH (ref 24–36)

## 2017-05-01 LAB — GLUCOSE, CAPILLARY
GLUCOSE-CAPILLARY: 170 mg/dL — AB (ref 65–99)
GLUCOSE-CAPILLARY: 144 mg/dL — AB (ref 65–99)
GLUCOSE-CAPILLARY: 134 mg/dL — AB (ref 65–99)
GLUCOSE-CAPILLARY: 122 mg/dL — AB (ref 65–99)

## 2017-05-01 LAB — CULTURE, BLOOD (ROUTINE X 2)
Culture: NO GROWTH
Culture: NO GROWTH
Special Requests: ADEQUATE
Special Requests: ADEQUATE

## 2017-05-01 LAB — HEPARIN LEVEL (UNFRACTIONATED): Heparin Unfractionated: 0.79 IU/mL — ABNORMAL HIGH (ref 0.30–0.70)

## 2017-05-01 LAB — MAGNESIUM: Magnesium: 2.3 mg/dL (ref 1.7–2.4)

## 2017-05-01 LAB — PHOSPHORUS: Phosphorus: 7.1 mg/dL — ABNORMAL HIGH (ref 2.5–4.6)

## 2017-05-01 MED ORDER — ALTEPLASE 2 MG IJ SOLR
INTRAMUSCULAR | Status: AC
  Filled 2017-05-01: qty 4

## 2017-05-01 MED ORDER — ALTEPLASE 2 MG IJ SOLR
4.0000 mg | Freq: Once | INTRAMUSCULAR | Status: AC
  Administered 2017-05-01: 4 mg

## 2017-05-01 NOTE — Procedures (Signed)
I was present at this dialysis session. I have reviewed the session itself and made appropriate changes.   Filed Weights   05/01/17 0536  Weight: (!) 163.3 kg (360 lb)    Recent Labs  Lab 05/01/17 0528  NA 131*  131*  K 3.6  3.6  CL 94*  94*  CO2 22  22  GLUCOSE 177*  183*  BUN 60*  61*  CREATININE 4.40*  4.40*  CALCIUM 8.6*  8.6*  PHOS 7.1*  7.1*    Recent Labs  Lab 04/29/17 0443 04/30/17 0458 04/30/17 2044 05/01/17 0528  WBC 22.8* 21.9* 24.6* 24.9*  NEUTROABS 19.4* 20.1*  --  21.0*  HGB 10.3* 9.7* 9.8* 9.5*  HCT 30.1* 29.1* 29.1* 28.2*  MCV 83.4 83.9 83.1 82.7  PLT 359 361 414* 466*    Scheduled Meds: . carvedilol  18.75 mg Oral BID WC  . colchicine  0.3 mg Oral Daily  . fluticasone  2 spray Each Nare Daily  . gabapentin  300 mg Oral BID  . insulin aspart  0-15 Units Subcutaneous TID WC  . insulin aspart  0-5 Units Subcutaneous QHS  . insulin glargine  25 Units Subcutaneous QHS  . predniSONE  60 mg Oral Q breakfast  . sodium chloride flush  3 mL Intravenous Q12H  . sodium chloride flush  3 mL Intravenous Q12H   Continuous Infusions: . sodium chloride    . sodium chloride    . sodium chloride    . sodium chloride    . amiodarone 30 mg/hr (04/30/17 2250)  . furosemide Stopped (05/01/17 1829)  . heparin 2,500 Units/hr (05/01/17 0650)   PRN Meds:.sodium chloride, sodium chloride, sodium chloride, acetaminophen, alteplase, heparin, heparin, heparin, heparin, lidocaine (PF), lidocaine (PF), lidocaine-prilocaine, methocarbamol, ondansetron (ZOFRAN) IV, pentafluoroprop-tetrafluoroeth, sodium chloride flush, sodium chloride flush, sodium chloride flush    1. Acute on chronic diastolic CHF- continue with UF and HD 2. AKI/CKD- due to cardiorenal syndrome.  Has had 4 HD sessions with net -11 liters.  Continue with IHD and will need vascular access once his volume status has improved.   3. Anemia of CKD- continue with UF and will likely need  ESA. 4. HTN 5. Fever- treated with steroids for possible RA flare per primary.  Donetta Potts,  MD 05/01/2017, 8:41 AM

## 2017-05-01 NOTE — Progress Notes (Signed)
ANTICOAGULATION CONSULT NOTE - Follow Up Consult  Pharmacy Consult for heparin infusion Indication: atrial fibrillation  No Known Allergies  Patient Measurements: Height: 6' (182.9 cm) Weight: (!) 360 lb (163.3 kg) IBW/kg (Calculated) : 77.6 Heparin Dosing Weight: 119.5 kg  Vital Signs: Temp: 97.4 F (36.3 C) (02/16 0536) Temp Source: Oral (02/16 0536) BP: 119/80 (02/16 0100) Pulse Rate: 68 (02/16 0100)  Labs: Recent Labs    04/29/17 0443  04/30/17 0458 04/30/17 1826 04/30/17 1839 04/30/17 2044 05/01/17 0528  HGB 10.3*  --  9.7*  --   --  9.8* 9.5*  HCT 30.1*  --  29.1*  --   --  29.1* 28.2*  PLT 359  --  361  --   --  414* 466*  APTT 55*   < > 70*  --  >200* 71* 92*  HEPARINUNFRC >2.20*  --  1.38* 1.46*  --   --  0.79*  CREATININE 4.04*  --  4.38*  --   --   --  4.40*  4.40*   < > = values in this interval not displayed.    Estimated Creatinine Clearance: 33.9 mL/min (A) (by C-G formula based on SCr of 4.4 mg/dL (H)).  Assessment: 61 yom on Xarelto PTA for Afib. Patient has marked volume overload with increasing Scr and decreasing urine output, despite lasix infusion and metolazone doses. Last dose of Xarelto was on 2/11 at 2154. Given recent Xarelto exposure, will evaluate both aPTT and heparin levels until correlate.   Successful cardioversion 2/15. APTT 92, remains at goal. CBC stable, no bleeding noted.  Goal of Therapy:  APTT level: 66-102 s Heparin level 0.3-0.7 units/ml Monitor platelets by anticoagulation protocol: Yes   Plan:  Continue heparin infusion at 2500 units/hr Daily PTT/heparin level. F/u plans to start Coumadin after permanent access placed.   Patterson Hammersmith PharmD PGY1 Pharmacy Practice Resident 05/01/2017 8:01 AM Pager: [phone] Phone:[phone]

## 2017-05-01 NOTE — Progress Notes (Signed)
24 Hour urine collection initiated at 4 PM today and due to finish by 4 PM 05/02/2017. Will report to incoming RN

## 2017-05-01 NOTE — Progress Notes (Signed)
Patient ID: John Bailey, male   DOB: 03-09-1971, 47 y.o.   MRN: [id number] P    Advanced Heart Failure Rounding Note  PCP-Cardiologist: Pixie Casino, MD  HF Cardiologist: [name], MD    47 year old poorly controlled hypertension severe left ventricular hypertrophy and acute kidney injury with history of atrial fibrillation and prior cardioversion.  He has been managed with ultrafiltration and hemodialysis On amiodarone for atrial fibrillation cardioverted yesterday  Subjective:    Shortness of breath some better.  Some back discomfort weights unfortunately not doing all that well with ranges from 330-387 pounds in 24 hours  Echo (04/25/17): EF 55-60%, severe LVH.   Objective:   Weight Range: (!) 360 lb (163.3 kg) Body mass index is 48.82 kg/m.   Vital Signs:   Temp:  [97.4 F (36.3 C)-98 F (36.7 C)] 97.7 F (36.5 C) (02/16 1137) Pulse Rate:  [67-75] 70 (02/16 1225) Resp:  [18-21] 20 (02/16 1225) BP: (94-127)/(57-82) 105/72 (02/16 1225) SpO2:  [94 %-99 %] 98 % (02/16 1225) Weight:  [360 lb (163.3 kg)] 360 lb (163.3 kg) (02/16 0536) Last BM Date: 04/29/17  Weight change: Filed Weights   05/01/17 0536  Weight: (!) 360 lb (163.3 kg)   Intake/Output:   Intake/Output Summary (Last 24 hours) at 05/01/2017 1333 Last data filed at 05/01/2017 1137 Gross per 24 hour  Intake 1090.4 ml  Output 5000 ml  Net -3909.6 ml     Physical Exam    Well developed and morbidly obese in mild discomfort with a dialysis catheter in his right neck HENT normal Neck supple   Clear Regular rate and rhythm, no murmurs or gallops Abd-soft with active BS No Clubbing cyanosis 3+ edema Skin-warm and dry A & Oriented  Grossly normal sensory and motor function   Telemetry   Personally reviewed sinus rhythm at 80  Labs    CBC Recent Labs    04/30/17 0458 04/30/17 2044 05/01/17 0528  WBC 21.9* 24.6* 24.9*  NEUTROABS 20.1*  --  21.0*  HGB 9.7* 9.8* 9.5*  HCT 29.1* 29.1*  28.2*  MCV 83.9 83.1 82.7  PLT 361 414* 387*   Basic Metabolic Panel Recent Labs    04/30/17 0458 05/01/17 0528  NA 130* 131*  131*  K 4.1 3.6  3.6  CL 94* 94*  94*  CO2 22 22  22   GLUCOSE 166* 177*  183*  BUN 61* 60*  61*  CREATININE 4.38* 4.40*  4.40*  CALCIUM 8.4* 8.6*  8.6*  MG 2.2 2.3  PHOS 7.2* 7.1*  7.1*   Liver Function Tests Recent Labs    04/30/17 0458 05/01/17 0528  AST 14* 10*  ALT 8* 7*  ALKPHOS 81 80  BILITOT 0.7 0.8  PROT 5.8* 6.2*  ALBUMIN 1.9* 2.0*  2.0*   No results for input(s): LIPASE, AMYLASE in the last 72 hours. Cardiac Enzymes No results for input(s): CKTOTAL, CKMB, CKMBINDEX, TROPONINI in the last 72 hours.  BNP: BNP (last 3 results) Recent Labs    03/06/17 1800 04/22/17 1734 04/23/17 0736  BNP 791.5* 531.3* 627.9*    ProBNP (last 3 results) No results for input(s): PROBNP in the last 8760 hours.   D-Dimer No results for input(s): DDIMER in the last 72 hours. Hemoglobin A1C No results for input(s): HGBA1C in the last 72 hours. Fasting Lipid Panel No results for input(s): CHOL, HDL, LDLCALC, TRIG, CHOLHDL, LDLDIRECT in the last 72 hours. Thyroid Function Tests No results for input(s): TSH, T4TOTAL, T3FREE,  THYROIDAB in the last 72 hours.  Invalid input(s): FREET3  Other results:   Imaging    No results found.   Medications:     Scheduled Medications: . carvedilol  18.75 mg Oral BID WC  . colchicine  0.3 mg Oral Daily  . fluticasone  2 spray Each Nare Daily  . gabapentin  300 mg Oral BID  . insulin aspart  0-15 Units Subcutaneous TID WC  . insulin aspart  0-5 Units Subcutaneous QHS  . insulin glargine  25 Units Subcutaneous QHS  . predniSONE  60 mg Oral Q breakfast  . sodium chloride flush  3 mL Intravenous Q12H  . sodium chloride flush  3 mL Intravenous Q12H    Infusions: . sodium chloride    . sodium chloride    . amiodarone 30 mg/hr (04/30/17 2250)  . furosemide Stopped (05/01/17 3435)  .  heparin 2,500 Units/hr (05/01/17 0650)    PRN Medications: sodium chloride, acetaminophen, lidocaine (PF), methocarbamol, ondansetron (ZOFRAN) IV, sodium chloride flush, sodium chloride flush, sodium chloride flush    Assessment/Plan     Acute on chronic diastolic heart failure  Anasarca  Atrial fibrillation-persistent now on amiodarone status post cardioversion  LV hypertrophy-severe with low voltage ECG question amyloid  Acute kidney injury now on hemodialysis and ultrafiltration for volume management  Leukocytosis  Knee arthritis  Length of Stay: 9  Patient is in need of ongoing diuresis.  He remains massively volume overloaded.  I suspect his dry weight is 3-50 pounds away.  I am struck by the degree of left ventricular hypertrophy and his low voltage ECG.  With his kidney disease, and his race, at some point it will be worth excluding transthyretin amyloid.  And while he has issues of hypertension, voltage on ECG is not consistent with this although his size could also attenuated  We will send immunoelectrophoresis and he may be possibly scanned from with technetium pyrophosphate to exclude.

## 2017-05-01 NOTE — Progress Notes (Signed)
PROGRESS NOTE    John Bailey  DUK:[id number] DOB: Apr 12, 1970 DOA: 04/22/2017 PCP: [name], MD   Brief Narrative:  John Bailey is a 47 y.o.with a PMH of rheumatoid arthritis, diabetes mellitus, heart failure, atrial fibrillation. Patient presented with dyspnea and found to have a CHF exacerbation. Diuresed with worsening kidney function. Now has Fever and new leukocytosis. Infectious workup so far is unremarkable. Discussed case with ID and patient's Rheumatologist and will start patient on po Prednisone. Patient dialyzed again this AM and was taken for a Successful DCCV yesterday 04/30/17. Per my conversation with Nephrology plan to hold Dialysis tomorrow to allow rest. Cardiology sending Immunoelectrophoresis and considering technetium scan to exclude Amyloid.    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  -Weight is now down 2 lbs setting of IV diuresis and Dialysis and is now 386 but was not weighed today  -Echocardiogram significant for an EF of 55-60% and severe LVH with dilated left atrium and trivial pericardial effusion; unable to assess diastolic dysfunction -Cardiology recommendations: Given IV Lasix 160 mg bolus q6h with poor UOP and worsening Cr -C/w Strict I's/O's, Daily Weights, SLIV  -Patient is -37,628 Liters  -Trialysis Cath placed 2/12/18and will undergo Dialysis again today to pull off some Volume -Per my Conversation with Dr. Marval Regal will not Dialyze tomorrow    Anasarca -Secondary to fluid overload. -Management above  Rheumatoid Arthritis Flare -Patient on Abatacept every Tuesday.  -Knee x-rays negative for knee effusion -Holding Abatacept in setting of possible infection -Bilateral knee x-ray Negative -C/w Methocarbamol q8hprn  -C/w Gabapentin 300 mg po BID  -Discussed with patient's outpatient Rheumatologist Dr. [name] and recommending po Prednisone and tapering; Started 60 mg po Prednisone and has been on it with some improvement. Will start to taper in the next few days   Atrial fibrillation with RVR s/p DCCV Cardioversion and Now in RVR -C/w Heparin gtt for now; Was on Xarelto as an outpatient and will go home on Coumadin at D/C -Back On Amiodarone gtt per Cardiology; po Amiodarone 200 mg po BID. Now D/C'd -Transferred to SDU given high risk on 2/14 -C/w Carvedilol 18.75 mg po BID  -DCCV done 2/15 and converted to NSR -Appreciate Cardiology Recc's   AKI on CKD Stage IV -Worsening and now ESRD -Per Nephro will get daily dialysis daily and likley take a break on Sunday -BUN/Cr went from 64/4.04 -> 61/4.38 -> 60/4.40 -Discussed with Dr. Marval Regal and we will consult VVS for Vascular Access Assessment/Placement  Hyperkalemia, improved -In the setting of worsening Kidney Disease. -K+ now 3.6 -Continue to monitor daily CMP's  SIRS suspect RA Flare vs Gouty Flare or Combination,  -Unknown etiology but likely combination of RA Flare and Gout Flare   -Some Tachycardia in setting of atrial fibrillation but probably worse from Pain -Otherwise, hemodynamically stable.  -Infectious workup so far has been negative. Patient with history of RA and likley this could be a flair.  -Kidney function worsening and would not start NSAIDs.  -Discussed with patient about  starting steroids and will start Prednisone 60 mg po Daily and Taper   -Repeat Urinalysis and obtain urine culture in setting of AKI pending  -Blood Cx x2 show NGTD at 4  days  -CXR yesterday AM showed Stable very low lung volumes, which limits evaluation. No evidence of pulmonary consolidation. -Patient aFebrile again today  -ESR was 110 and CRP was 34.1 -Checked Uric Acid Level and was 14.2 -C/w Colchicine per Nephrology  -WBC went from 8.8 -> 21.6 -> 22.8 -> 21.9 -> 24.9 -Discussed with Infectious Diseases Dr. Tommy Medal who feels Fever and WBC is related to RA -Discussed with patient's Rheumatologist Dr. [name] who recommended starting po Prednisone and continuing to hold Abatacept   Diabetes Mellitus  -C/w Lantus 25 units sq qHS and with Moderate Novolog SSI AC/HS -CBG's ranging from 144-167 and Continue to Monitor   Hyperphosphatemia -Patient's Phos Level was 7.1 -Expect to Correct in Dialysis -Repeat Phos in AM   HTN with Severe LVH -BP stable -Cardiology Dr. Caryl Comes checking to r/o Amyloid given degree of LVH -Immunoelectrophoresis   DVT prophylaxis: Anticoagulated with Heparin gtt Code Status: FULL CODE Family Communication: No family present at bedside Disposition Plan: SNF when medically stable to D/C   Consultants:   Cardiology Heart Failure Team  Nephrology  Interventional Radiology  Discussed Case with ID Dr. Tommy Medal  Discussed Case with Patient's Primary Rheumatologist Dr. [name]    Procedures:  ECHOCARDIOGRAM 04/25/17 ------------------------------------------------------------------- Study Conclusions  - Left ventricle: The cavity size was normal. Wall thickness was   increased in a pattern of severe LVH. Systolic function was   normal. The estimated ejection fraction was in the range of 55%   to 60%. The study is not technically sufficient to allow   evaluation of LV diastolic function. - Mitral valve: There was mild regurgitation. - Left atrium: The atrium was moderately dilated. - Pericardium, extracardiac: A trivial pericardial effusion was   identified posterior to the heart.  Antimicrobials:   Anti-infectives (From admission, onward)   None     Subjective: Seen and examined again today in dialysis and states he had some mild abdominal discomfort. No CP or SOB. No Lightheadedness or Dizziness.  Objective: Vitals:   05/01/17 1100 05/01/17 1130 05/01/17 1137 05/01/17 1225  BP: 105/62 (!) 112/58 109/63 105/72  Pulse: 67 69 68 70  Resp: '18 18 18 20  ' Temp:   97.7 F (36.5 C)   TempSrc:   Oral   SpO2:   96% 98%  Weight:      Height:        Intake/Output Summary (Last 24 hours) at 05/01/2017 1631 Last data filed at 05/01/2017 1137 Gross per 24 hour  Intake 1090.4 ml  Output 5000 ml  Net -3909.6 ml   Filed Weights   05/01/17 0536  Weight: (!) 163.3 kg (360 lb)   Examination: Physical Exam:  Constitutional: WN/WD obese AAM in NAD Eyes: Sclerae anicteric. Lids normal ENMT: External Ears and Nose appear normal. Grossly normal hearing Neck: Supple with JVD. RIJ Trialysis HD Cathter in place Respiratory: Diminished to auscultation bilaterally. No appreciable wheezing/rales/rhonchi. Unlabored breathing  Cardiovascular: RRR; 2-3+ LE Edema and still anasarcic looking  Abdomen: Soft, Slightly tender. Severely Distended. Bowel sounds present GU: Defererd Musculoskeletal: No contractures; No cyanosis Skin: Warm and Dry. LE bandaged Neurologic: CN 2-12 grossly intact.  Psychiatric: Normal mood and affect. Awake and alert  Data Reviewed: I have personally reviewed following labs and imaging studies  CBC: Recent Labs  Lab 04/27/17 0640 04/28/17 0532 04/29/17 0443 04/30/17 0458 04/30/17 2044 05/01/17 0528  WBC 21.5* 21.6* 22.8* 21.9* 24.6* 24.9*  NEUTROABS 18.7* 18.8* 19.4* 20.1*  --  21.0*  HGB 9.5* 10.5* 10.3* 9.7* 9.8* 9.5*  HCT 29.0* 30.9* 30.1* 29.1* 29.1* 28.2*  MCV 86.1 85.1 83.4 83.9 83.1 82.7  PLT 267 334 359 361 414* 117*   Basic Metabolic Panel: Recent Labs  Lab 04/27/17 0640 04/28/17 0532 04/29/17 0443 04/30/17 0458 05/01/17 0528  NA 131* 130*  132* 130* 131*  131*  K 4.8 5.0 4.3 4.1 3.6  3.6  CL 96* 95* 95* 94* 94*  94*  CO2 21* 18* '22 22 22  22  ' GLUCOSE 115* 91 91 166* 177*  183*  BUN 63* 74* 64* 61* 60*  61*  CREATININE 3.65* 4.09* 4.04* 4.38* 4.40*  4.40*  CALCIUM 8.2* 8.3* 8.3* 8.4* 8.6*  8.6*  MG  --   --  2.2 2.2 2.3  PHOS  --   --  6.0* 7.2* 7.1*  7.1*   GFR: Estimated Creatinine Clearance: 33.9 mL/min (A) (by C-G formula based on SCr of 4.4 mg/dL (H)). Liver Function Tests: Recent Labs  Lab 04/29/17 0443 04/30/17 0458 05/01/17 0528  AST 13* 14* 10*  ALT 6* 8* 7*  ALKPHOS 80 81 80  BILITOT 0.9 0.7 0.8  PROT 6.2* 5.8* 6.2*  ALBUMIN 2.1* 1.9* 2.0*  2.0*   No results for input(s): LIPASE, AMYLASE in the last 168 hours. No results for input(s): AMMONIA in the last 168 hours. Coagulation Profile: No results for input(s): INR, PROTIME in the last 168 hours. Cardiac Enzymes: No results for input(s): CKTOTAL, CKMB, CKMBINDEX, TROPONINI in the last 168 hours. BNP (last 3 results) No results for input(s): PROBNP in the last 8760 hours. HbA1C: No results for input(s): HGBA1C in the last 72 hours. CBG: Recent Labs  Lab 04/30/17 0356 04/30/17 1048 04/30/17 1632 04/30/17 2003 05/01/17 1221  GLUCAP 170* 147* 157* 164* 144*   Lipid Profile: No results for input(s): CHOL, HDL, LDLCALC, TRIG, CHOLHDL, LDLDIRECT in the last 72 hours. Thyroid Function Tests: No results for input(s): TSH, T4TOTAL, FREET4, T3FREE, THYROIDAB in the last 72 hours. Anemia Panel: No results for input(s): VITAMINB12, FOLATE, FERRITIN, TIBC, IRON, RETICCTPCT in the last 72 hours. Sepsis Labs: No results for input(s): PROCALCITON, LATICACIDVEN in the last 168 hours.  Recent Results (from the past 240 hour(s))  Respiratory Panel by PCR     Status: None   Collection Time: 04/25/17 10:50 AM  Result Value Ref Range Status   Adenovirus NOT DETECTED NOT DETECTED Final   Coronavirus 229E NOT DETECTED NOT DETECTED Final    Coronavirus HKU1 NOT DETECTED NOT DETECTED Final   Coronavirus NL63 NOT DETECTED NOT DETECTED Final   Coronavirus OC43 NOT DETECTED NOT DETECTED Final   Metapneumovirus NOT DETECTED NOT DETECTED Final   Rhinovirus / Enterovirus NOT DETECTED NOT DETECTED Final   Influenza A NOT DETECTED NOT DETECTED Final   Influenza A H1 NOT DETECTED NOT DETECTED Final   Influenza A H1 2009 NOT DETECTED NOT DETECTED Final   Influenza A H3 NOT DETECTED NOT DETECTED Final   Influenza B NOT DETECTED NOT DETECTED Final   Parainfluenza Virus 1 NOT DETECTED NOT DETECTED Final   Parainfluenza Virus 2 NOT DETECTED NOT DETECTED Final   Parainfluenza Virus 3 NOT DETECTED NOT DETECTED Final   Parainfluenza Virus 4 NOT DETECTED NOT DETECTED Final   Respiratory Syncytial Virus NOT DETECTED NOT DETECTED  Final   Bordetella pertussis NOT DETECTED NOT DETECTED Final   Chlamydophila pneumoniae NOT DETECTED NOT DETECTED Final   Mycoplasma pneumoniae NOT DETECTED NOT DETECTED Final    Comment: Performed at [facility], [place] [address], [place], [place] [address]  Culture, blood (routine x 2)     Status: None   Collection Time: 04/26/17 12:10 AM  Result Value Ref Range Status   Specimen Description BLOOD LEFT HAND  Final   Special Requests IN PEDIATRIC BOTTLE Blood Culture adequate volume  Final   Culture   Final    NO GROWTH 5 DAYS Performed at [facility], [place] [address] [place], [place] [address]    Report Status 05/01/2017 FINAL  Final  Culture, blood (routine x 2)     Status: None   Collection Time: 04/26/17 12:18 AM  Result Value Ref Range Status   Specimen Description BLOOD LEFT HAND  Final   Special Requests IN PEDIATRIC BOTTLE Blood Culture adequate volume  Final   Culture   Final    NO GROWTH 5 DAYS Performed at [facility], [place] [address], [place], [place] [address]    Report Status 05/01/2017 FINAL  Final    Radiology Studies: No results found. Scheduled Meds: . carvedilol   18.75 mg Oral BID WC  . colchicine  0.3 mg Oral Daily  . fluticasone  2 spray Each Nare Daily  . gabapentin  300 mg Oral BID  . insulin aspart  0-15 Units Subcutaneous TID WC  . insulin aspart  0-5 Units Subcutaneous QHS  . insulin glargine  25 Units Subcutaneous QHS  . predniSONE  60 mg Oral Q breakfast  . sodium chloride flush  3 mL Intravenous Q12H  . sodium chloride flush  3 mL Intravenous Q12H   Continuous Infusions: . sodium chloride    . sodium chloride    . amiodarone 30 mg/hr (04/30/17 2250)  . furosemide Stopped (05/01/17 0511)  . heparin 2,500 Units/hr (05/01/17 0650)    LOS: 9 days   Kerney Elbe, DO [facility] Pager [phone]  If 7PM-7AM, please contact night-coverage [email or web address] Password [facility] 05/01/2017, 4:31 PM

## 2017-05-02 DIAGNOSIS — R197 Diarrhea, unspecified: Secondary | ICD-10-CM

## 2017-05-02 LAB — COMPREHENSIVE METABOLIC PANEL
AST: 11 U/L — AB (ref 15–41)
Albumin: 2.2 g/dL — ABNORMAL LOW (ref 3.5–5.0)
Alkaline Phosphatase: 83 U/L (ref 38–126)
Anion gap: 14 (ref 5–15)
BUN: 56 mg/dL — AB (ref 6–20)
CHLORIDE: 95 mmol/L — AB (ref 101–111)
CO2: 22 mmol/L (ref 22–32)
CREATININE: 4.72 mg/dL — AB (ref 0.61–1.24)
Calcium: 8.3 mg/dL — ABNORMAL LOW (ref 8.9–10.3)
GFR calc Af Amer: 16 mL/min — ABNORMAL LOW (ref >60)
GFR calc non Af Amer: 14 mL/min — ABNORMAL LOW (ref >60)
Glucose, Bld: 139 mg/dL — ABNORMAL HIGH (ref 65–99)
Potassium: 3.5 mmol/L (ref 3.5–5.1)
SODIUM: 131 mmol/L — AB (ref 135–145)
Total Bilirubin: 0.6 mg/dL (ref 0.3–1.2)
Total Protein: 6.2 g/dL — ABNORMAL LOW (ref 6.5–8.1)

## 2017-05-02 LAB — CBC WITH DIFFERENTIAL/PLATELET
BASOS PCT: 0 %
Basophils Absolute: 0 10*3/uL (ref 0.0–0.1)
EOS PCT: 0 %
Eosinophils Absolute: 0 10*3/uL (ref 0.0–0.7)
HEMATOCRIT: 29.4 % — AB (ref 39.0–52.0)
HEMOGLOBIN: 9.8 g/dL — AB (ref 13.0–17.0)
LYMPHS ABS: 1.9 10*3/uL (ref 0.7–4.0)
Lymphocytes Relative: 7 %
MCH: 27.8 pg (ref 26.0–34.0)
MCHC: 33.3 g/dL (ref 30.0–36.0)
MCV: 83.3 fL (ref 78.0–100.0)
MONOS PCT: 11 %
Monocytes Absolute: 3 10*3/uL — ABNORMAL HIGH (ref 0.1–1.0)
Neutro Abs: 22.1 10*3/uL — ABNORMAL HIGH (ref 1.7–7.7)
Neutrophils Relative %: 82 %
Platelets: 503 10*3/uL — ABNORMAL HIGH (ref 150–400)
RBC: 3.53 MIL/uL — AB (ref 4.22–5.81)
RDW: 17.6 % — ABNORMAL HIGH (ref 11.5–15.5)
WBC: 27 10*3/uL — ABNORMAL HIGH (ref 4.0–10.5)

## 2017-05-02 LAB — GLUCOSE, CAPILLARY
GLUCOSE-CAPILLARY: 168 mg/dL — AB (ref 65–99)
GLUCOSE-CAPILLARY: 254 mg/dL — AB (ref 65–99)
Glucose-Capillary: 139 mg/dL — ABNORMAL HIGH (ref 65–99)
Glucose-Capillary: 309 mg/dL — ABNORMAL HIGH (ref 65–99)

## 2017-05-02 LAB — PHOSPHORUS: Phosphorus: 5.8 mg/dL — ABNORMAL HIGH (ref 2.5–4.6)

## 2017-05-02 LAB — APTT: APTT: 76 s — AB (ref 24–36)

## 2017-05-02 LAB — MAGNESIUM: Magnesium: 2.3 mg/dL (ref 1.7–2.4)

## 2017-05-02 LAB — C DIFFICILE QUICK SCREEN W PCR REFLEX
C DIFFICILE (CDIFF) INTERP: NOT DETECTED
C DIFFICLE (CDIFF) ANTIGEN: NEGATIVE
C Diff toxin: NEGATIVE

## 2017-05-02 LAB — HEPARIN LEVEL (UNFRACTIONATED): Heparin Unfractionated: 0.47 IU/mL (ref 0.30–0.70)

## 2017-05-02 NOTE — Progress Notes (Signed)
ANTICOAGULATION CONSULT NOTE - Follow Up Consult  Pharmacy Consult for heparin infusion Indication: atrial fibrillation  No Known Allergies  Patient Measurements: Height: 6' (182.9 cm) Weight: (patient refused stand and bed scale needs to be zerod) IBW/kg (Calculated) : 77.6 Heparin Dosing Weight: 119.5 kg  Vital Signs: Temp: 98.5 F (36.9 C) (02/17 0530) Temp Source: Oral (02/17 0530) BP: 121/86 (02/17 0530) Pulse Rate: 79 (02/17 0530)  Labs: Recent Labs    04/30/17 0458 04/30/17 1826  04/30/17 2044 05/01/17 0528 05/02/17 0750 05/02/17 0834  HGB 9.7*  --   --  9.8* 9.5* 9.8*  --   HCT 29.1*  --   --  29.1* 28.2* 29.4*  --   PLT 361  --   --  414* 466* 503*  --   APTT 70*  --    < > 71* 92* 76*  --   HEPARINUNFRC 1.38* 1.46*  --   --  0.79*  --  0.47  CREATININE 4.38*  --   --   --  4.40*  4.40* 4.72*  --    < > = values in this interval not displayed.    Estimated Creatinine Clearance: 31.6 mL/min (A) (by C-G formula based on SCr of 4.72 mg/dL (H)).  Assessment: 45 yom on Xarelto PTA for Afib. Patient has marked volume overload with increasing Scr and decreasing urine output, despite lasix infusion and metolazone doses. Last dose of Xarelto was on 2/11 at 2154. Given recent Xarelto exposure, will evaluate both aPTT and heparin levels until correlate.   Successful cardioversion 2/15. APTT remains therapeutic at 76 s, and heparin level now correlated at 0.47 - will follow heparin levels at this time.  H/H low but stable, plts 503, no bleeding noted at this time.    Goal of Therapy:  APTT level: 66-102 s Heparin level 0.3-0.7 units/ml Monitor platelets by anticoagulation protocol: Yes   Plan:  Continue heparin gtt at 2500 units/hr Daily heparin level, CBC, s/s bleeding F/u plans to start Coumadin after permanent access placed.   Carnella Guadalajara PharmD PGY1 Pharmacy Practice Resident 05/02/2017 9:30 AM Pager: [phone] [phone]

## 2017-05-02 NOTE — Progress Notes (Signed)
S:Still complaining of being stiff and sore O:BP 121/86 (BP Location: Left Arm)   Pulse 79   Temp 98.5 F (36.9 C) (Oral)   Resp (!) 22   Ht 6' (1.829 m)   Wt (!) 163.3 kg (360 lb)   SpO2 99%   BMI 48.82 kg/m   Intake/Output Summary (Last 24 hours) at 05/02/2017 1421 Last data filed at 05/02/2017 0830 Gross per 24 hour  Intake 1142 ml  Output 5 ml  Net 1137 ml   Intake/Output: I/O last 3 completed shifts: In: 2352.4 [P.O.:1170; I.V.:950.4; IV Piggyback:232] Out: 5005 [Urine:5; Other:5000]  Intake/Output this shift:  Total I/O In: 120 [P.O.:120] Out: -  Weight change:  Gen: NAD CVS: no rub Resp: decreased BS at bases Abd: obese, +BS Ext: 3+ edema/anasarca  Recent Labs  Lab 04/26/17 0000 04/27/17 0640 04/28/17 0532 04/29/17 0443 04/30/17 0458 05/01/17 0528 05/02/17 0750  NA 134* 131* 130* 132* 130* 131*  131* 131*  K 4.1 4.8 5.0 4.3 4.1 3.6  3.6 3.5  CL 100* 96* 95* 95* 94* 94*  94* 95*  CO2 21* 21* 18* 22 22 22  22 22   GLUCOSE 143* 115* 91 91 166* 177*  183* 139*  BUN 51* 63* 74* 64* 61* 60*  61* 56*  CREATININE 2.85* 3.65* 4.09* 4.04* 4.38* 4.40*  4.40* 4.72*  ALBUMIN  --   --   --  2.1* 1.9* 2.0*  2.0* 2.2*  CALCIUM 8.1* 8.2* 8.3* 8.3* 8.4* 8.6*  8.6* 8.3*  PHOS  --   --   --  6.0* 7.2* 7.1*  7.1* 5.8*  AST  --   --   --  13* 14* 10* 11*  ALT  --   --   --  6* 8* 7* <5*   Liver Function Tests: Recent Labs  Lab 04/30/17 0458 05/01/17 0528 05/02/17 0750  AST 14* 10* 11*  ALT 8* 7* <5*  ALKPHOS 81 80 83  BILITOT 0.7 0.8 0.6  PROT 5.8* 6.2* 6.2*  ALBUMIN 1.9* 2.0*  2.0* 2.2*   No results for input(s): LIPASE, AMYLASE in the last 168 hours. No results for input(s): AMMONIA in the last 168 hours. CBC: Recent Labs  Lab 04/29/17 0443 04/30/17 0458 04/30/17 2044 05/01/17 0528 05/02/17 0750  WBC 22.8* 21.9* 24.6* 24.9* 27.0*  NEUTROABS 19.4* 20.1*  --  21.0* 22.1*  HGB 10.3* 9.7* 9.8* 9.5* 9.8*  HCT 30.1* 29.1* 29.1* 28.2* 29.4*  MCV  83.4 83.9 83.1 82.7 83.3  PLT 359 361 414* 466* 503*   Cardiac Enzymes: No results for input(s): CKTOTAL, CKMB, CKMBINDEX, TROPONINI in the last 168 hours. CBG: Recent Labs  Lab 05/01/17 1221 05/01/17 1702 05/01/17 2034 05/02/17 0805 05/02/17 1134  GLUCAP 144* 134* 122* 139* 168*    Iron Studies: No results for input(s): IRON, TIBC, TRANSFERRIN, FERRITIN in the last 72 hours. Studies/Results: No results found. . carvedilol  18.75 mg Oral BID WC  . colchicine  0.3 mg Oral Daily  . fluticasone  2 spray Each Nare Daily  . gabapentin  300 mg Oral BID  . insulin aspart  0-15 Units Subcutaneous TID WC  . insulin aspart  0-5 Units Subcutaneous QHS  . insulin glargine  25 Units Subcutaneous QHS  . predniSONE  60 mg Oral Q breakfast  . sodium chloride flush  3 mL Intravenous Q12H  . sodium chloride flush  3 mL Intravenous Q12H    BMET    Component Value Date/Time   NA  131 (L) 05/02/2017 0750   K 3.5 05/02/2017 0750   CL 95 (L) 05/02/2017 0750   CO2 22 05/02/2017 0750   GLUCOSE 139 (H) 05/02/2017 0750   BUN 56 (H) 05/02/2017 0750   CREATININE 4.72 (H) 05/02/2017 0750   CALCIUM 8.3 (L) 05/02/2017 0750   GFRNONAA 14 (L) 05/02/2017 0750   GFRAA 16 (L) 05/02/2017 0750   CBC    Component Value Date/Time   WBC 27.0 (H) 05/02/2017 0750   RBC 3.53 (L) 05/02/2017 0750   HGB 9.8 (L) 05/02/2017 0750   HCT 29.4 (L) 05/02/2017 0750   PLT 503 (H) 05/02/2017 0750   MCV 83.3 05/02/2017 0750   MCH 27.8 05/02/2017 0750   MCHC 33.3 05/02/2017 0750   RDW 17.6 (H) 05/02/2017 0750   LYMPHSABS 1.9 05/02/2017 0750   MONOABS 3.0 (H) 05/02/2017 0750   EOSABS 0.0 05/02/2017 0750   BASOSABS 0.0 05/02/2017 0750     Assessment/Plan:  1. Acute on chronic diastolic CHF- initially 615PPH above edw.  Has tolerated HD with significant UF of over 15 liters since admission.  Continue with daily UF and HD 2. AKI/CKD- due to cardiorenal syndrome.  Has had 4 HD sessions with net -11 liters.   Continue with IHD and will need vascular access once his volume status has improved.  Unfortunately his uop has dropped significantly despite high dose lasix.  Will stop and cont with daily HD and UF.   3. Anemia of CKD- continue with UF and start ESA. 4. HTN 5. Hyponatremia- due to CHF 6. Fever- treated with steroids for possible RA flare per primary. 7. Deconditioning- will need PT/OT to help mobilize patient now that he has improvement of volume overload. 8. Vascular access- will need vein mapping, VVS consult, and tunneled HD catheter placement and AVF/AVG later this week.    Donetta Potts, MD [employer] [phone]

## 2017-05-02 NOTE — Progress Notes (Signed)
PROGRESS NOTE    John Bailey  SJG:[id number] DOB: [birth year] DOA: 04/22/2017 PCP: [name], MD   Brief Narrative:  John Bailey is a 47 y.o.with a PMH of rheumatoid arthritis, diabetes mellitus, heart failure, atrial fibrillation. Patient presented with dyspnea and found to have a CHF exacerbation. Diuresed with worsening kidney function. Now has Fever and new leukocytosis. Infectious workup so far is unremarkable. Discussed case with ID and patient's Rheumatologist and will start patient on po Prednisone. Patient dialyzed again this AM and was taken for a Successful DCCV yesterday 04/30/17. Per my conversation with Nephrology plan to hold Dialysis tomorrow to allow rest. Cardiology sending Immunoelectrophoresis and considering technetium scan to exclude Amyloid. Patient complaining of severe diarrhea and has not been on Abx and not taking Laxatives so will r/o C Difficile.   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  -Weight down to 360 but not weighed today  -Echocardiogram significant for an EF of 55-60% and severe LVH with dilated left atrium and trivial pericardial effusion; unable to assess diastolic dysfunction -IV Lasix 160 mg bolus q6h to be stopped by Nephrology with poor UOP and worsening Cr -C/w Strict I's/O's, Daily Weights, SLIV  -Patient is -15,115 Liters  -Trialysis Cath placed 04/27/16 and will undergo Dialysis again today to pull off some Volume -Per my Conversation with Dr.  Marval Regal will not Dialyze tomorrow   Anasarca -Secondary to fluid overload. -Management above with Hemodialysis   Rheumatoid Arthritis Flare -Patient on Abatacept every Tuesday.  -Knee x-rays negative for knee effusion -Held Abatacept in setting of possible infection -Bilateral knee x-ray Negative -C/w Methocarbamol q8hprn  -C/w Gabapentin 300 mg po BID  -Discussed with patient's outpatient Rheumatologist Dr. [name] and recommending po Prednisone and tapering; Started 60 mg po Prednisone and has been on it with some improvement. Will start to taper in the next few days   Atrial fibrillation with RVR s/p DCCV Cardioversion and Now in RVR -C/w Heparin gtt for now; Was on Xarelto as an outpatient and will go home on Coumadin at D/C per prior Cardiology notes. Dr. Caryl Comes of Cardiology recommending D/Cing Heparin gtt and starting on Apixaban  -Back On Amiodarone gtt per Cardiology; po Amiodarone 200 mg po BID. Now D/C'd -Transferred to SDU given high risk on 2/14 -C/w Carvedilol 18.75 mg po BID  -DCCV done 2/15 and converted to NSR -Appreciate Cardiology Recc's   AKI on CKD Stage IV -Worsening and now ESRD -Per Nephro will get daily dialysis; C/w Daily UF and HD -BUN/Cr went from 64/4.04 -> 61/4.38 -> 60/4.40 -> 56/4.72 -IV Lasix being stopped by Nephrology  -Discussed with Dr. Marval Regal and we will consult VVS for Vascular Access Assessment/Placement  Hyperkalemia, improved -In the setting of worsening Kidney Disease. -K+ now 3.5 -Continue to monitor daily CMP's  SIRS suspect RA Flare vs Gouty Flare or Combination,  -Unknown etiology but likely combination of RA Flare and Gout Flare   -Some Tachycardia in setting of atrial fibrillation but probably worse from Pain -Otherwise, hemodynamically  stable.  -Infectious workup so far has been negative. Patient with history of RA and likley this could be a flair.  -Kidney function worsening and would not start NSAIDs.    -Discussed with patient about starting steroids and will start Prednisone 60 mg po Daily and Taper   -Repeat Urinalysis and obtain urine culture in setting of AKI pending  -Blood Cx x2 show NGTD at 4 days  -CXR yesterday AM showed Stable very low lung volumes, which limits evaluation. No evidence of pulmonary consolidation. -Patient aFebrile again today  -ESR was 110 and CRP was 34.1 -Checked Uric Acid Level and was 14.2 -C/w Colchicine per Nephrology  -WBC went from 8.8 -> 21.6 -> 22.8 -> 21.9 -> 24.9 -Discussed with Infectious Diseases Dr. Tommy Medal who feels Fever and WBC is related to RA -Discussed with patient's Rheumatologist Dr. [name] who recommended starting po Prednisone and continuing to hold Abatacept   Diabetes Mellitus  -C/w Lantus 25 units sq qHS and with Moderate Novolog SSI AC/HS -CBG's ranging from 139-254 and Continue to Monitor   Hyperphosphatemia -Patient's Phos Level was 5.8 -Expect to Correct further in Dialysis -Repeat Phos in AM   HTN with Severe LVH -BP stable -Cardiology Dr. Caryl Comes checking to r/o Amyloid given degree of LVH -Immunoelectrophoresis to check for Amyloid  Diarrhea -Patient not on Abx or Stool softeners -Having Liquidy Diarrhea and multiple Episodes and given elevated WBC (prior to Steroid initiation and Fevers) will r/o Infectious Diarrhea  -Checked C Difficile and was Negative  -GI Pathogen Panel Pending    DVT prophylaxis: Anticoagulated with Heparin gtt Code Status: FULL CODE Family Communication: No family present at bedside Disposition Plan: SNF when medically stable to D/C   Consultants:   Cardiology Heart Failure Team  Nephrology  Interventional Radiology  Discussed Case with ID Dr. Tommy Medal  Discussed Case with Patient's Primary Rheumatologist Dr. [name]    Procedures:  ECHOCARDIOGRAM 04/25/17 ------------------------------------------------------------------- Study Conclusions  - Left ventricle: The cavity  size was normal. Wall thickness was   increased in a pattern of severe LVH. Systolic function was   normal. The estimated ejection fraction was in the range of 55%   to 60%. The study is not technically sufficient to allow   evaluation of LV diastolic function. - Mitral valve: There was mild regurgitation. - Left atrium: The atrium was moderately dilated. - Pericardium, extracardiac: A trivial pericardial effusion was   identified posterior to the heart.  Antimicrobials:  Anti-infectives (From admission, onward)   None     Subjective: Seen and examined again today and states his pain is improved with the steroids but still feels puffy. Has been having problems with his condom catheter and 24 hour urine collection stopped working. No nausea or vomiting. Complaining of having significant amount of diarrhea.   Objective: Vitals:   05/02/17 0100 05/02/17 0200 05/02/17 0300 05/02/17 0530  BP: 127/72 117/78 111/79 121/86  Pulse:    79  Resp: 13 11 (!) 22   Temp:    98.5 F (36.9 C)  TempSrc:    Oral  SpO2:    99%  Weight:      Height:        Intake/Output Summary (Last 24 hours) at 05/02/2017 1642 Last data filed at 05/02/2017 0830 Gross per 24 hour  Intake 1142 ml  Output 5 ml  Net 1137 ml   Filed Weights   05/01/17 0536  Weight: (!) 163.3 kg (360 lb)   Examination: Physical Exam:  Constitutional: WN/WD morbidly obese AAM who is extremely swollen but in NAD Eyes: Sclerae anicteric. Lids Normal ENMT: External Ears and Nose appear normal. Grossly normal hearing Neck: Supple with JVD. Has a RIJ Trialysis HD Catheter in place Respiratory: Diminished to auscultation bilaterally; Unlabored breathing but wearing supplemental O2 via [place] Cardiovascular: RRR 2-3+ Edema Abdomen: slightly firm, NT, Severely distended. Bowel sounds present GU: Had significant scrotal edema Musculoskeletal: No contractures; No cyanosis but extremely swollen Skin: Warm and Dry. LE  bandaged Neurologic: CN 2-12 grossly intact.  Psychiatric: Normal mood and affect. Intact judgement and insight  Data Reviewed: I have personally reviewed following labs and imaging studies  CBC: Recent Labs  Lab 04/28/17 0532 04/29/17 0443 04/30/17 0458 04/30/17 2044 05/01/17 0528 05/02/17 0750  WBC 21.6* 22.8* 21.9* 24.6* 24.9* 27.0*  NEUTROABS 18.8* 19.4* 20.1*  --  21.0* 22.1*  HGB 10.5* 10.3* 9.7* 9.8* 9.5* 9.8*  HCT 30.9* 30.1* 29.1* 29.1* 28.2* 29.4*  MCV 85.1 83.4 83.9 83.1 82.7 83.3  PLT 334 359 361 414* 466* 579*   Basic Metabolic Panel: Recent Labs  Lab 04/28/17 0532 04/29/17 0443 04/30/17 0458 05/01/17 0528 05/02/17 0750  NA 130* 132* 130* 131*  131* 131*  K 5.0 4.3 4.1 3.6  3.6 3.5  CL 95* 95* 94* 94*  94* 95*  CO2 18* '22 22 22  22 22  ' GLUCOSE 91 91 166* 177*  183* 139*  BUN 74* 64* 61* 60*  61* 56*  CREATININE 4.09* 4.04* 4.38* 4.40*  4.40* 4.72*  CALCIUM 8.3* 8.3* 8.4* 8.6*  8.6* 8.3*  MG  --  2.2 2.2 2.3 2.3  PHOS  --  6.0* 7.2* 7.1*  7.1* 5.8*   GFR: Estimated Creatinine Clearance: 31.6 mL/min (A) (by C-G formula based on SCr of 4.72 mg/dL (H)). Liver Function Tests: Recent Labs  Lab 04/29/17 0443 04/30/17 0458 05/01/17 0528 05/02/17 0750  AST 13* 14* 10* 11*  ALT 6* 8* 7* <5*  ALKPHOS 80 81 80 83  BILITOT 0.9 0.7 0.8 0.6  PROT 6.2* 5.8* 6.2* 6.2*  ALBUMIN 2.1* 1.9* 2.0*  2.0* 2.2*   No results for input(s): LIPASE, AMYLASE in the last 168 hours. No results for input(s): AMMONIA in the last 168 hours. Coagulation Profile: No results for input(s): INR, PROTIME in the last 168 hours. Cardiac Enzymes: No results for input(s): CKTOTAL, CKMB, CKMBINDEX, TROPONINI in the last 168 hours. BNP (last 3 results) No results for input(s): PROBNP in the last 8760 hours. HbA1C: No results for input(s): HGBA1C in the last 72 hours. CBG: Recent Labs  Lab 05/01/17 1221 05/01/17 1702 05/01/17 2034 05/02/17 0805 05/02/17 1134  GLUCAP  144* 134* 122* 139* 168*   Lipid Profile: No results for input(s): CHOL, HDL, LDLCALC, TRIG, CHOLHDL, LDLDIRECT in the last 72 hours. Thyroid Function Tests: No results for input(s): TSH, T4TOTAL, FREET4, T3FREE, THYROIDAB in the last 72 hours. Anemia Panel: No results for input(s): VITAMINB12, FOLATE, FERRITIN, TIBC, IRON, RETICCTPCT in the last 72 hours. Sepsis Labs: No results for input(s): PROCALCITON, LATICACIDVEN in the last 168 hours.  Recent Results (from the past 240 hour(s))  Respiratory Panel by PCR     Status: None   Collection Time: 04/25/17 10:50 AM  Result Value Ref Range Status   Adenovirus NOT DETECTED NOT DETECTED Final   Coronavirus 229E NOT DETECTED NOT DETECTED Final   Coronavirus HKU1 NOT DETECTED NOT DETECTED Final   Coronavirus NL63 NOT DETECTED NOT DETECTED Final   Coronavirus OC43 NOT  DETECTED NOT DETECTED Final   Metapneumovirus NOT DETECTED NOT DETECTED Final   Rhinovirus / Enterovirus NOT DETECTED NOT DETECTED Final   Influenza A NOT DETECTED NOT DETECTED Final   Influenza A H1 NOT DETECTED NOT DETECTED Final   Influenza A H1 2009 NOT DETECTED NOT DETECTED Final   Influenza A H3 NOT DETECTED NOT DETECTED Final   Influenza B NOT DETECTED NOT DETECTED Final   Parainfluenza Virus 1 NOT DETECTED NOT DETECTED Final   Parainfluenza Virus 2 NOT DETECTED NOT DETECTED Final   Parainfluenza Virus 3 NOT DETECTED NOT DETECTED Final   Parainfluenza Virus 4 NOT DETECTED NOT DETECTED Final   Respiratory Syncytial Virus NOT DETECTED NOT DETECTED Final   Bordetella pertussis NOT DETECTED NOT DETECTED Final   Chlamydophila pneumoniae NOT DETECTED NOT DETECTED Final   Mycoplasma pneumoniae NOT DETECTED NOT DETECTED Final    Comment: Performed at [facility], [place] [address], [place], [place] [address]  Culture, blood (routine x 2)     Status: None   Collection Time: 04/26/17 12:10 AM  Result Value Ref Range Status   Specimen Description BLOOD LEFT HAND  Final    Special Requests IN PEDIATRIC BOTTLE Blood Culture adequate volume  Final   Culture   Final    NO GROWTH 5 DAYS Performed at [facility], [place] [address] [place], [place] [address]    Report Status 05/01/2017 FINAL  Final  Culture, blood (routine x 2)     Status: None   Collection Time: 04/26/17 12:18 AM  Result Value Ref Range Status   Specimen Description BLOOD LEFT HAND  Final   Special Requests IN PEDIATRIC BOTTLE Blood Culture adequate volume  Final   Culture   Final    NO GROWTH 5 DAYS Performed at [facility], [place] [address], [place], [place] [address]    Report Status 05/01/2017 FINAL  Final  C difficile quick scan w PCR reflex     Status: None   Collection Time: 05/02/17  2:42 PM  Result Value Ref Range Status   C Diff antigen NEGATIVE NEGATIVE Final   C Diff toxin NEGATIVE NEGATIVE Final   C Diff interpretation No C. difficile detected.  Final    Comment: Performed at [facility], [place] [address], [place], [place] [address]    Radiology Studies: No results found. Scheduled Meds: . carvedilol  18.75 mg Oral BID WC  . colchicine  0.3 mg Oral Daily  . fluticasone  2 spray Each Nare Daily  . gabapentin  300 mg Oral BID  . insulin aspart  0-15 Units Subcutaneous TID WC  . insulin aspart  0-5 Units Subcutaneous QHS  . insulin glargine  25 Units Subcutaneous QHS  . predniSONE  60 mg Oral Q breakfast  . sodium chloride flush  3 mL Intravenous Q12H  . sodium chloride flush  3 mL Intravenous Q12H   Continuous Infusions: . sodium chloride    . sodium chloride    . amiodarone 30 mg/hr (05/02/17 0523)  . heparin 2,500 Units/hr (05/02/17 0522)    LOS: 10 days   Kerney Elbe, DO [facility] Pager [phone]  If 7PM-7AM, please contact night-coverage [email or web address] Password [facility] 05/02/2017, 4:42 PM

## 2017-05-02 NOTE — Plan of Care (Signed)
Patient is still severally edematous.  He has not put out more than 42ml of urine all night in spite of IV lasix.  He has also had several loose bowel movements during night.

## 2017-05-02 NOTE — Progress Notes (Signed)
Patient ID: John Bailey, male   DOB: [birth year], 47 y.o.   MRN: [id number] P    Advanced Heart Failure Rounding Note  PCP-Cardiologist: Pixie Casino, MD  HF Cardiologist: [name], MD    47 year old poorly controlled hypertension severe left ventricular hypertrophy and acute kidney injury with history of atrial fibrillation and prior cardioversion.  He has been managed with ultrafiltration and hemodialysis On amiodarone for atrial fibrillation cardioverted yesterday  Subjective:    Shortness of breath some better.  Some back discomfort weights unfortunately not doing all that well with ranges from 330-387 pounds in 24 hours  Echo (04/25/17): EF 55-60%, severe LVH.   Shortness of breath about the same.  Apparently not well able to sit up??  Objective:   Weight Range: (!) 360 lb (163.3 kg) Body mass index is 48.82 kg/m.   Vital Signs:   Temp:  [97.7 F (36.5 C)-98.5 F (36.9 C)] 98.5 F (36.9 C) (02/17 0530) Pulse Rate:  [67-79] 79 (02/17 0530) Resp:  [11-22] 22 (02/17 0300) BP: (105-127)/(58-86) 121/86 (02/17 0530) SpO2:  [96 %-99 %] 99 % (02/17 0530) Last BM Date: 05/02/17  Weight change: Filed Weights   05/01/17 0536  Weight: (!) 360 lb (163.3 kg)   Intake/Output:   Intake/Output Summary (Last 24 hours) at 05/02/2017 1055 Last data filed at 05/02/2017 0830 Gross per 24 hour  Intake 1382 ml  Output 5005 ml  Net -3623 ml     Physical Exam    Morbidly obese in no acute distress lying flat in bed being fed a sandwich HENT normal Neck supple with dialysis catheter in the right neck Clear Regular rate and rhythm, no murmurs or gallops Abd-soft with active BS No Clubbing cyanosis 3-4+ edema Skin-warm and dry A & Oriented  Grossly normal sensory and motor function   Telemetry   Personally reviewed sinus  Labs    CBC Recent Labs    05/01/17 0528 05/02/17 0750  WBC 24.9* 27.0*  NEUTROABS 21.0* 22.1*  HGB 9.5* 9.8*  HCT 28.2* 29.4*  MCV  82.7 83.3  PLT 466* 629*   Basic Metabolic Panel Recent Labs    05/01/17 0528 05/02/17 0750  NA 131*  131* 131*  K 3.6  3.6 3.5  CL 94*  94* 95*  CO2 22  22 22   GLUCOSE 177*  183* 139*  BUN 60*  61* 56*  CREATININE 4.40*  4.40* 4.72*  CALCIUM 8.6*  8.6* 8.3*  MG 2.3 2.3  PHOS 7.1*  7.1* 5.8*   Liver Function Tests Recent Labs    05/01/17 0528 05/02/17 0750  AST 10* 11*  ALT 7* <5*  ALKPHOS 80 83  BILITOT 0.8 0.6  PROT 6.2* 6.2*  ALBUMIN 2.0*  2.0* 2.2*   No results for input(s): LIPASE, AMYLASE in the last 72 hours. Cardiac Enzymes No results for input(s): CKTOTAL, CKMB, CKMBINDEX, TROPONINI in the last 72 hours.  BNP: BNP (last 3 results) Recent Labs    03/06/17 1800 04/22/17 1734 04/23/17 0736  BNP 791.5* 531.3* 627.9*    ProBNP (last 3 results) No results for input(s): PROBNP in the last 8760 hours.   D-Dimer No results for input(s): DDIMER in the last 72 hours. Hemoglobin A1C No results for input(s): HGBA1C in the last 72 hours. Fasting Lipid Panel No results for input(s): CHOL, HDL, LDLCALC, TRIG, CHOLHDL, LDLDIRECT in the last 72 hours. Thyroid Function Tests No results for input(s): TSH, T4TOTAL, T3FREE, THYROIDAB in the last 72 hours.  Invalid input(s): FREET3  Other results:   Imaging    No results found.   Medications:     Scheduled Medications: . carvedilol  18.75 mg Oral BID WC  . colchicine  0.3 mg Oral Daily  . fluticasone  2 spray Each Nare Daily  . gabapentin  300 mg Oral BID  . insulin aspart  0-15 Units Subcutaneous TID WC  . insulin aspart  0-5 Units Subcutaneous QHS  . insulin glargine  25 Units Subcutaneous QHS  . predniSONE  60 mg Oral Q breakfast  . sodium chloride flush  3 mL Intravenous Q12H  . sodium chloride flush  3 mL Intravenous Q12H    Infusions: . sodium chloride    . sodium chloride    . amiodarone 30 mg/hr (05/02/17 0523)  . furosemide 160 mg (05/02/17 1027)  . heparin 2,500 Units/hr  (05/02/17 0522)    PRN Medications: sodium chloride, acetaminophen, lidocaine (PF), methocarbamol, ondansetron (ZOFRAN) IV, sodium chloride flush, sodium chloride flush, sodium chloride flush    Assessment/Plan     Acute on chronic diastolic heart failure  Anasarca  Atrial fibrillation-persistent now on amiodarone status post cardioversion  LV hypertrophy-severe with low voltage ECG question amyloid  Acute kidney injury now on hemodialysis and ultrafiltration for volume management  Leukocytosis ?? secondary to steroids  Knee arthritis  ? rheumatoid arthritis   Oh MY  Holding sinus rhythm.  Unfortunately, while urinating, given his anasarca, they have been unable to collect his urine and his bed gets wet.  I do not know if there is a way to help him sit up in this bed of his -- anything that can be do help is mobilization and prevent bed boundedness would be helpful   Volume is being most effectively mobilized by IHD although he remains on intermittent high-dose furosemide  My inclination is to change his IV heparin to apixaban.  This would save 600 cc a day  Length of Stay: 10

## 2017-05-02 NOTE — Progress Notes (Signed)
Pt had 24 hour urine collect from 2/16 1600- 2/17 1600. During the duration of the 24 hour process the pt did not produce any urine. The pt had dialysis on 2/16 and has slowly producing less and less urine. Discussed with Dr Alfredia Ferguson and made aware.

## 2017-05-03 ENCOUNTER — Encounter (HOSPITAL_COMMUNITY): Payer: Self-pay | Admitting: Cardiology

## 2017-05-03 ENCOUNTER — Inpatient Hospital Stay (HOSPITAL_COMMUNITY): Payer: BLUE CROSS/BLUE SHIELD

## 2017-05-03 DIAGNOSIS — Z01810 Encounter for preprocedural cardiovascular examination: Secondary | ICD-10-CM

## 2017-05-03 DIAGNOSIS — Z992 Dependence on renal dialysis: Secondary | ICD-10-CM

## 2017-05-03 DIAGNOSIS — N186 End stage renal disease: Secondary | ICD-10-CM

## 2017-05-03 LAB — GASTROINTESTINAL PANEL BY PCR, STOOL (REPLACES STOOL CULTURE)
ADENOVIRUS F40/41: NOT DETECTED
ASTROVIRUS: NOT DETECTED
CRYPTOSPORIDIUM: NOT DETECTED
CYCLOSPORA CAYETANENSIS: NOT DETECTED
Campylobacter species: NOT DETECTED
ENTAMOEBA HISTOLYTICA: NOT DETECTED
ENTEROPATHOGENIC E COLI (EPEC): NOT DETECTED
ENTEROTOXIGENIC E COLI (ETEC): NOT DETECTED
Enteroaggregative E coli (EAEC): NOT DETECTED
GIARDIA LAMBLIA: NOT DETECTED
Norovirus GI/GII: NOT DETECTED
Plesimonas shigelloides: NOT DETECTED
Rotavirus A: NOT DETECTED
Salmonella species: NOT DETECTED
Sapovirus (I, II, IV, and V): NOT DETECTED
Shiga like toxin producing E coli (STEC): NOT DETECTED
Shigella/Enteroinvasive E coli (EIEC): NOT DETECTED
VIBRIO CHOLERAE: NOT DETECTED
VIBRIO SPECIES: NOT DETECTED
YERSINIA ENTEROCOLITICA: NOT DETECTED

## 2017-05-03 LAB — CBC WITH DIFFERENTIAL/PLATELET
BASOS PCT: 0 %
Basophils Absolute: 0 10*3/uL (ref 0.0–0.1)
EOS ABS: 0 10*3/uL (ref 0.0–0.7)
EOS PCT: 0 %
HCT: 29.2 % — ABNORMAL LOW (ref 39.0–52.0)
HEMOGLOBIN: 9.8 g/dL — AB (ref 13.0–17.0)
Lymphocytes Relative: 5 %
Lymphs Abs: 1.4 10*3/uL (ref 0.7–4.0)
MCH: 28 pg (ref 26.0–34.0)
MCHC: 33.6 g/dL (ref 30.0–36.0)
MCV: 83.4 fL (ref 78.0–100.0)
MONO ABS: 1.1 10*3/uL — AB (ref 0.1–1.0)
Monocytes Relative: 4 %
NEUTROS ABS: 25.1 10*3/uL — AB (ref 1.7–7.7)
NEUTROS PCT: 91 %
PLATELETS: 484 10*3/uL — AB (ref 150–400)
RBC: 3.5 MIL/uL — ABNORMAL LOW (ref 4.22–5.81)
RDW: 17.8 % — ABNORMAL HIGH (ref 11.5–15.5)
WBC: 27.6 10*3/uL — ABNORMAL HIGH (ref 4.0–10.5)

## 2017-05-03 LAB — HEPARIN LEVEL (UNFRACTIONATED)
HEPARIN UNFRACTIONATED: 0.7 [IU]/mL (ref 0.30–0.70)
Heparin Unfractionated: 0.45 IU/mL (ref 0.30–0.70)

## 2017-05-03 LAB — GLUCOSE, CAPILLARY
GLUCOSE-CAPILLARY: 227 mg/dL — AB (ref 65–99)
GLUCOSE-CAPILLARY: 131 mg/dL — AB (ref 65–99)
GLUCOSE-CAPILLARY: 139 mg/dL — AB (ref 65–99)
GLUCOSE-CAPILLARY: 144 mg/dL — AB (ref 65–99)

## 2017-05-03 LAB — COMPREHENSIVE METABOLIC PANEL
ALK PHOS: 80 U/L (ref 38–126)
ALT: 7 U/L — AB (ref 17–63)
ANION GAP: 14 (ref 5–15)
AST: 10 U/L — ABNORMAL LOW (ref 15–41)
Albumin: 2.3 g/dL — ABNORMAL LOW (ref 3.5–5.0)
BUN: 60 mg/dL — ABNORMAL HIGH (ref 6–20)
CALCIUM: 8.3 mg/dL — AB (ref 8.9–10.3)
CO2: 21 mmol/L — ABNORMAL LOW (ref 22–32)
CREATININE: 5.38 mg/dL — AB (ref 0.61–1.24)
Chloride: 94 mmol/L — ABNORMAL LOW (ref 101–111)
GFR, EST AFRICAN AMERICAN: 14 mL/min — AB (ref >60)
GFR, EST NON AFRICAN AMERICAN: 12 mL/min — AB (ref >60)
Glucose, Bld: 207 mg/dL — ABNORMAL HIGH (ref 65–99)
Potassium: 3.1 mmol/L — ABNORMAL LOW (ref 3.5–5.1)
Sodium: 129 mmol/L — ABNORMAL LOW (ref 135–145)
Total Bilirubin: 0.5 mg/dL (ref 0.3–1.2)
Total Protein: 5.7 g/dL — ABNORMAL LOW (ref 6.5–8.1)

## 2017-05-03 LAB — MAGNESIUM: MAGNESIUM: 2.3 mg/dL (ref 1.7–2.4)

## 2017-05-03 LAB — PHOSPHORUS: Phosphorus: 6.5 mg/dL — ABNORMAL HIGH (ref 2.5–4.6)

## 2017-05-03 MED ORDER — ALUM & MAG HYDROXIDE-SIMETH 200-200-20 MG/5ML PO SUSP
30.0000 mL | Freq: Once | ORAL | Status: AC
  Administered 2017-05-03: 30 mL via ORAL
  Filled 2017-05-03: qty 30

## 2017-05-03 MED ORDER — CEFAZOLIN SODIUM-DEXTROSE 1-4 GM/50ML-% IV SOLN
1.0000 g | INTRAVENOUS | Status: DC
  Filled 2017-05-03: qty 50

## 2017-05-03 MED ORDER — AMIODARONE HCL 200 MG PO TABS
400.0000 mg | ORAL_TABLET | Freq: Two times a day (BID) | ORAL | Status: DC
  Administered 2017-05-03 – 2017-05-07 (×7): 400 mg via ORAL
  Filled 2017-05-03 (×7): qty 2

## 2017-05-03 NOTE — Progress Notes (Signed)
ANTICOAGULATION CONSULT NOTE - Follow Up Consult  Pharmacy Consult for heparin infusion Indication: atrial fibrillation  No Known Allergies  Patient Measurements: Height: 6' (182.9 cm) Weight: (floor to weigh ) IBW/kg (Calculated) : 77.6 Heparin Dosing Weight: 119.5 kg  Vital Signs: Temp: 97.4 F (36.3 C) (02/18 1253) Temp Source: Axillary (02/18 1253) BP: 103/79 (02/18 1655) Pulse Rate: 63 (02/18 1655)  Labs: Recent Labs    04/30/17 2044  05/01/17 0528 05/02/17 0750 05/02/17 0834 05/03/17 0838 05/03/17 1300 05/03/17 1818  HGB 9.8*  --  9.5* 9.8*  --  9.8*  --   --   HCT 29.1*  --  28.2* 29.4*  --  29.2*  --   --   PLT 414*  --  466* 503*  --  484*  --   --   APTT 71*  --  92* 76*  --   --   --   --   HEPARINUNFRC  --    < > 0.79*  --  0.47  --  0.70 0.45  CREATININE  --   --  4.40*  4.40* 4.72*  --  5.38*  --   --    < > = values in this interval not displayed.    Estimated Creatinine Clearance: 27.7 mL/min (A) (by C-G formula based on SCr of 5.38 mg/dL (H)).  Assessment: 6 yom on Xarelto PTA for Afib. Patient has marked volume overload with increasing Scr and decreasing urine output, despite lasix infusion and metolazone doses. Last dose of Xarelto was on 2/11 at 2154. Given recent Xarelto exposure, will evaluate both aPTT and heparin levels until correlate.   Successful cardioversion 2/15. Heparin level this evening is 0.45, within goal range, on 2450 units/hr. No infusion issues. No signs/symptoms of bleeding.   Goal of Therapy:  APTT level: 66-102 s Heparin level 0.3-0.7 units/ml Monitor platelets by anticoagulation protocol: Yes   Plan:  Continue heparin infusion at 2450 units / hr Daily heparin level, CBC, s/s bleeding  Doylene Canard, PharmD Clinical Pharmacist  Pager: [phone] Clinical Phone for 05/03/2017 until 3:30pm: [phone] If after 3:30pm, please call main pharmacy at [phone] 05/03/2017 7:15 PM

## 2017-05-03 NOTE — Clinical Social Work Note (Signed)
Clinical Social Work Assessment  Patient Details  Name: John Bailey MRN: [id number] Date of Birth: 03/08/1971  Date of referral:  05/03/17               Reason for consult:  Facility Placement                Permission sought to share information with:  Facility Contact Representative Permission granted to share information::  Yes, Verbal Permission Granted  Name::        Agency::  SNFs  Relationship::     Contact Information:     Housing/Transportation Living arrangements for the past 2 months:  Single Family Home Source of Information:  Patient Patient Interpreter Needed:  None Criminal Activity/Legal Involvement Pertinent to Current Situation/Hospitalization:  No - Comment as needed Significant Relationships:  Spouse, Dependent Children Lives with:  Spouse, Minor Children Do you feel safe going back to the place where you live?  Yes Need for family participation in patient care:  Yes (Comment)  Care giving concerns: Patient from home with spouse and young children (ages 5 and 10). PT recommending SNF. New HD awaiting clip.   Social Worker assessment / plan: CSW met with patient at bedside. Patient oriented, though appeared somewhat lethargic. CSW discussed recommendation for SNF and patient's new HD. Patient indicated he is able to sit up in bed and move around in bed independently. Patient requested PT return to work with him again to document his progress. PT notes so far have indicated patient requires max/total assist for bed mobility and ambulation not attempted.    Patient indicated he wants to return home to be with his wife and children. CSW acknowledged patient's wish and reflected on PT notes and reflected that short term SNF may be helpful in helping patient regain some independence so he can be fully present for his family.  Patient agreeable to send out initial referrals to determine SNF options, though still hopeful to discharge directly home. CSW to send out  referrals and continue to follow for disposition planning.  Employment status:  Full-Time(stopped working two weeks before coming into hospital) Insurance information:  Managed Care(BCBS) PT Recommendations:  Skilled Nursing Facility Information / Referral to community resources:  Skilled Nursing Facility  Patient/Family's Response to care: Patient appreciative of care.  Patient/Family's Understanding of and Emotional Response to Diagnosis, Current Treatment, and Prognosis: Patient with understanding of his medical conditions, though with poor insight into how he will continue to manage independently. However, patient motivated to participate with PT.  Emotional Assessment Appearance:  Appears stated age Attitude/Demeanor/Rapport:  Engaged, Lethargic Affect (typically observed):  Calm Orientation:  Oriented to Self, Oriented to Place, Oriented to  Time, Oriented to Situation Alcohol / Substance use:  Not Applicable Psych involvement (Current and /or in the community):  No (Comment)  Discharge Needs  Concerns to be addressed:  Discharge Planning Concerns, Care Coordination Readmission within the last 30 days:  No Current discharge risk:  Physical Impairment Barriers to Discharge:  Continued Medical Work up   Susan Porter, LCSW 05/03/2017, 3:10 PM  

## 2017-05-03 NOTE — Progress Notes (Signed)
Preliminary results by tech - Upper Ext. Arterial Duplex Completed. Right arm - patent brachial, radial and ulnar arteries without evidence of stenosis. There is a high bifurcation of the radial and ulnar arteries in the upper arm. Left arm - patent brachial, radial and ulnar arteries without evidence of stenosis. Oda Cogan, BS, RDMS, RVT

## 2017-05-03 NOTE — Progress Notes (Signed)
ANTICOAGULATION CONSULT NOTE - Follow Up Consult  Pharmacy Consult for heparin infusion Indication: atrial fibrillation  No Known Allergies  Patient Measurements: Height: 6' (182.9 cm) Weight: (floor to weigh ) IBW/kg (Calculated) : 77.6 Heparin Dosing Weight: 119.5 kg  Vital Signs: Temp: 97.4 F (36.3 C) (02/18 1253) Temp Source: Axillary (02/18 1253) BP: 124/72 (02/18 1253) Pulse Rate: 61 (02/18 1253)  Labs: Recent Labs    04/30/17 2044 05/01/17 0528 05/02/17 0750 05/02/17 0834 05/03/17 0838 05/03/17 1300  HGB 9.8* 9.5* 9.8*  --  9.8*  --   HCT 29.1* 28.2* 29.4*  --  29.2*  --   PLT 414* 466* 503*  --  484*  --   APTT 71* 92* 76*  --   --   --   HEPARINUNFRC  --  0.79*  --  0.47  --  0.70  CREATININE  --  4.40*  4.40* 4.72*  --  5.38*  --     Estimated Creatinine Clearance: 27.7 mL/min (A) (by C-G formula based on SCr of 5.38 mg/dL (H)).  Assessment: 30 yom on Xarelto PTA for Afib. Patient has marked volume overload with increasing Scr and decreasing urine output, despite lasix infusion and metolazone doses. Last dose of Xarelto was on 2/11 at 2154. Given recent Xarelto exposure, will evaluate both aPTT and heparin levels until correlate.   Successful cardioversion 2/15. Heparin level this PM = 0.7  Goal of Therapy:  APTT level: 66-102 s Heparin level 0.3-0.7 units/ml Monitor platelets by anticoagulation protocol: Yes   Plan:  Decrease heparin to 2450 units / hr Daily heparin level, CBC, s/s bleeding  Thank you Anette Guarneri, PharmD [phone]  05/03/2017 2:07 PM

## 2017-05-03 NOTE — Progress Notes (Signed)
Physical Therapy Treatment Patient Details Name: John Bailey MRN: [id number] DOB: 1970-09-28 Today's Date: 05/03/2017    History of Present Illness John Bailey is a 47 y.o. rheumatoid arthritis, diabetes mellitus, heart failure, atrial fibrillation. Patient presented with dyspnea and found to have a CHF exacerbation    PT Comments    Pt very participative today and encouraged by his ability to better help move today.  Emphasis on aggressive ROM and then mobility to/from  EOB with approx 15 minutes sitting without assist.  Mobility with 2 to 3 person assist for everyone's safety.   Follow Up Recommendations  SNF;Supervision/Assistance - 24 hour     Equipment Recommendations       Recommendations for Other Services       Precautions / Restrictions Precautions Precautions: Fall    Mobility  Bed Mobility Overal bed mobility: Needs Assistance Bed Mobility: Supine to Sit;Sit to Supine Rolling: Mod assist;+2 for safety/equipment   Supine to sit: Max assist;+2 for physical assistance Sit to supine: Max assist;+2 for physical assistance   General bed mobility comments: Rolled multiple times to switch out dirty pads and draw sheet..  Used draw sheet and pt's UE assist to "helicopter: around and sit EOB.  Significant assist needed to get pt pivoted, but pt assisted bring trunk forward, with abs, but mostly with UE's.  For safety, 3 person assist best to return to bed, due to pt less able to assist with legs due to edema.  Transfers                    Ambulation/Gait                 Stairs            Wheelchair Mobility    Modified Rankin (Stroke Patients Only)       Balance Overall balance assessment: Needs assistance Sitting-balance support: Single extremity supported;Feet supported Sitting balance-Leahy Scale: Poor Sitting balance - Comments: Sat at EOB, w/shifted to right side propped on upper bed rail with R UE for approx. 15 min  min  guard assist                                    Cognition Arousal/Alertness: Awake/alert Behavior During Therapy: WFL for tasks assessed/performed Overall Cognitive Status: Within Functional Limits for tasks assessed                                        Exercises Other Exercises Other Exercises: Aggressive AA/PROM to all 4's, but ot pt tolerance.    General Comments General comments (skin integrity, edema, etc.): EHR stayed in the 100's and 110's throughout.  Sats upper 90's on RA      Pertinent Vitals/Pain Pain Assessment: Faces Faces Pain Scale: Hurts even more Pain Location: generalized due to increased edema and pain when touching patient to assist in mobilization Pain Descriptors / Indicators: Discomfort;Constant;Grimacing Pain Intervention(s): Monitored during session;Limited activity within patient's tolerance    Home Living                      Prior Function            PT Goals (current goals can now be found in the care plan section) Acute Rehab PT Goals Patient Stated  Goal: To walk out of the hospital PT Goal Formulation: With patient Time For Goal Achievement: 05/08/17 Potential to Achieve Goals: Fair Progress towards PT goals: Progressing toward goals    Frequency    Min 3X/week      PT Plan Current plan remains appropriate    Co-evaluation              AM-PAC PT "6 Clicks" Daily Activity  Outcome Measure  Difficulty turning over in bed (including adjusting bedclothes, sheets and blankets)?: Unable Difficulty moving from lying on back to sitting on the side of the bed? : Unable Difficulty sitting down on and standing up from a chair with arms (e.g., wheelchair, bedside commode, etc,.)?: Unable Help needed moving to and from a bed to chair (including a wheelchair)?: Total Help needed walking in hospital room?: Total Help needed climbing 3-5 steps with a railing? : Total 6 Click Score: 6    End  of Session   Activity Tolerance: Patient tolerated treatment well;Patient limited by pain;Patient limited by fatigue Patient left: in bed;with call bell/phone within reach;with bed alarm set Nurse Communication: Mobility status PT Visit Diagnosis: Other abnormalities of gait and mobility (R26.89);Muscle weakness (generalized) (M62.81)     Time: 3845-3646 PT Time Calculation (min) (ACUTE ONLY): 39 min  Charges:  $Therapeutic Exercise: 8-22 mins $Therapeutic Activity: 23-37 mins                    G Codes:       May 16, 2017  Donnella Sham, PT [phone]  (pager)   [name] 05-16-2017, 6:14 PM

## 2017-05-03 NOTE — Progress Notes (Signed)
Right  Upper Extremity Vein Map  Cephalic  Segment Diameter Depth Comment  1. Axilla 3.35mm 16.66mm   2. Mid upper arm mm mm bandages  3. Above AC 2.72mm 4.25mm   4. In [facility] 1.46mm 9.60mm   5. Below AC 3.21mm 9.62mm   6. prox  forearm 3.10mm 10.71mm branch  7. Mid forearm 2.67mm 9.29mm bandages  8 distal forearm 3.68mm 2.43mm   9 wrist 1.74mm 2.17mm    mm mm    Basilic  Segment Diameter Depth Comment  1. Axilla mm mm NV  2. Mid upper arm mm mm NV  3. Above AC mm mm NV  4. In AC 2.49mm mm   5. Below AC 1.35mm mm   6. Mid forearm 1.71mm mm   7. Wrist 1.94mm mm    mm mm    mm mm    mm mm     Left  Upper Extremity Vein Map  Cephalic  Segment Diameter Depth Comment  1. Axilla 3.44mm 20.78mm   2. Proximal  upper 3.6 mm 13.5 mm   2. Mid upper arm 2.35mm 10.5mm   3. Above AC 2.67mm 10.108mm   4. In [facility] 3.51mm 3.76mm   5. Below AC 2.85mm 8.48mm branch  6. Mid forearm 2.65mm 4.44mm   7. Wrist 3.6mm 4.43mm    mm mm    mm mm    mm mm    Basilic  Segment Diameter Depth Comment  1. Axilla mm mm nv  2. Mid upper arm mm mm nv  3. Above AC mm mm nv  4. In AC 3.31mm mm   5. Below AC 4.70mm mm   6. Mid forearm 1.24mm mm   7. Wrist mm mm                  Oda Cogan, BS, RDMS, RVT

## 2017-05-03 NOTE — Procedures (Signed)
I was present at this dialysis session. I have reviewed the session itself and made appropriate changes.   No clear data on quantity of UOP but appears to be very little.   Says he feels 'better'.    Temp R IJ HD Cath with BFR 150 and has been persistent and not improved with tPA. Will ask VVS to eval for tunneling, will eventually need AVF or AVG.   4K bath  Plan  HD again tomorrow with primary goal of UF.    Filed Weights   05/01/17 0536  Weight: (!) 163.3 kg (360 lb)    Recent Labs  Lab 05/02/17 0750  NA 131*  K 3.5  CL 95*  CO2 22  GLUCOSE 139*  BUN 56*  CREATININE 4.72*  CALCIUM 8.3*  PHOS 5.8*    Recent Labs  Lab 05/01/17 0528 05/02/17 0750 05/03/17 0838  WBC 24.9* 27.0* 27.6*  NEUTROABS 21.0* 22.1* PENDING  HGB 9.5* 9.8* 9.8*  HCT 28.2* 29.4* 29.2*  MCV 82.7 83.3 83.4  PLT 466* 503* 484*    Scheduled Meds: . carvedilol  18.75 mg Oral BID WC  . colchicine  0.3 mg Oral Daily  . fluticasone  2 spray Each Nare Daily  . gabapentin  300 mg Oral BID  . insulin aspart  0-15 Units Subcutaneous TID WC  . insulin aspart  0-5 Units Subcutaneous QHS  . insulin glargine  25 Units Subcutaneous QHS  . predniSONE  60 mg Oral Q breakfast  . sodium chloride flush  3 mL Intravenous Q12H  . sodium chloride flush  3 mL Intravenous Q12H   Continuous Infusions: . sodium chloride    . sodium chloride    . amiodarone 30 mg/hr (05/03/17 0549)  . heparin 2,500 Units/hr (05/03/17 0423)   PRN Meds:.sodium chloride, acetaminophen, lidocaine (PF), methocarbamol, ondansetron (ZOFRAN) IV, sodium chloride flush, sodium chloride flush, sodium chloride flush   Pearson Grippe  MD 05/03/2017, 9:40 AM

## 2017-05-03 NOTE — Consult Note (Signed)
Hospital Consult    Reason for Consult:  In need of dialysis access and [facility] Requesting Physician:  Joelyn Oms MRN #:  [id number]  History of Present Illness: This is a 47 y.o. male who was admitted to the hospital on 04/22/17 with hypoxia and generalized edema.  He has a hx of CKD IV and is followed by Dr. Moshe Cipro.   Since being admitted to the hospital, he has been on HD.  He has tolerated this and had significant UF of over 15L since admit and was initially 100lb above dry weight.   He has hx of chronic combined systolic and diastolic heart failure with improved EF of 50-55% in 2018.  He is on insulin for DM.  He has a hx of atrial flutter and is on Xarelto at home.  He has a hx of DCCV in 2017 & 2018 with recurrent afib.  He is currently on heparin gtt.  He is morbidly obese.  He has never smoked.  He is on a beta blocker & CCB for blood pressure management/heart failure.  He is on Abatacept for RA.    Pt does have leukocytosis suspect RA flare or gout flare.  Pt's infectious workup negative so far.  He is on Prednisone.    Pt is right hand dominant.   Past Medical History:  Diagnosis Date  . Atrial flutter with rapid ventricular response ([place]) 08/06/2015   a. s/p DCCV in 07/2015 with recurrent atrial fibrillation and DCCV in 12/2016. Initially successful but noted to be back in atrial fibrillation within 2 weeks of DCCV.   [name] Chronic diastolic (congestive) heart failure (H[facility])    a. EF 50-55% by echo in 06/2016.  . Diabetes ([place])   . Hypertension     Past Surgical History:  Procedure Laterality Date  . CARDIOVERSION N/A 08/01/2015   Procedure: CARDIOVERSION;  Surgeon: Josue Hector, MD;  Location: [facility] ENDOSCOPY;  Service: Cardiovascular;  Laterality: N/A;  . CARDIOVERSION N/A 12/18/2016   Procedure: CARDIOVERSION;  Surgeon: [name], MD;  Location: [place];  Service: Cardiovascular;  Laterality: N/A;  . CHOLECYSTECTOMY    . IR FLUORO GUIDE CV LINE RIGHT  04/27/2017  .  IR US GUIDE VASC ACCESS RIGHT  04/27/2017  . TEE WITHOUT CARDIOVERSION N/A 08/01/2015   Procedure: TRANSESOPHAGEAL ECHOCARDIOGRAM (TEE);  Surgeon: Josue Hector, MD;  Location: [facility] ENDOSCOPY;  Service: Cardiovascular;  Laterality: N/A;    No Known Allergies  Prior to Admission medications   Medication Sig Start Date End Date Taking? Authorizing Provider  Abatacept 125 MG/ML SOAJ Inject 125 mg into the skin every 7 (seven) days. On Tuesday 04/13/17  Yes [provider]  acetaminophen (TYLENOL) 325 MG tablet Take 650 mg by mouth every 6 (six) hours as needed for mild pain.   Yes [provider]  albuterol (PROVENTIL HFA;VENTOLIN HFA) 108 (90 BASE) MCG/ACT inhaler Inhale 1-2 puffs into the lungs every 6 (six) hours as needed for wheezing. 05/30/14  Yes Nahser, Wonda Cheng, MD  carvedilol (COREG) 12.5 MG tablet Take 1 tablet (12.5 mg total) by mouth 2 (two) times daily with a meal. 03/31/17  Yes Allie Bossier, MD  celecoxib (CELEBREX) 100 MG capsule Take 100 mg by mouth daily. 03/04/17  Yes [provider]  colchicine 0.6 MG tablet Take 0.5 tablets (0.3 mg total) by mouth daily. 12/16/16  Yes Barton Dubois, MD  diltiazem (CARDIZEM CD) 180 MG 24 hr capsule Take 1 capsule (180 mg total) by mouth daily. 04/01/17  Yes Allie Bossier, MD  fluticasone [facility]) 50 MCG/ACT nasal spray Place 2 sprays into the nose daily.  12/29/12  Yes [provider]  furosemide (LASIX) 40 MG tablet Take 40 mg by mouth 2 (two) times daily. 03/24/17  Yes [provider]  gabapentin (NEURONTIN) 300 MG capsule Take 1 capsule (300 mg total) by mouth 2 (two) times daily. Patient taking differently: Take 300 mg by mouth 3 (three) times daily.  03/09/17  Yes Debbe Odea, MD  insulin aspart (NOVOLOG FLEXPEN) 100 UNIT/ML FlexPen Inject 0-12 Units into the skin 3 (three) times daily as needed for high blood sugar. Sliding Scale <70, initiate hypoglycemia protocol, 70-100=0 units, 110-199= 3  units, 200-250=5 units, 251-300=7 units, 301-350=10 units, 351-400=12 units, >400=14 units. Call MD if >450 03/09/17  Yes Debbe Odea, MD  Insulin Glargine (LANTUS) 100 UNIT/ML Solostar Pen Inject 25 Units into the skin daily at 10 pm. 03/09/17  Yes Rizwan, Eunice Blase, MD  potassium chloride SA (KLOR-CON M20) 20 MEQ tablet Take 1 tablet (20 mEq total) by mouth daily. 08/19/15  Yes Hilty, Nadean Corwin, MD  rivaroxaban (XARELTO) 20 MG TABS tablet take 1 tablet by mouth once daily WITH SUPPER Patient taking differently: Take 20 mg by mouth daily with supper.  10/12/16  Yes Hilty, Nadean Corwin, MD  Insulin Pen Needle 31G X 6 MM MISC 1 Device by Does not apply route 2 (two) times daily. 08/08/15   Charlynne Cousins, MD    Social History   Socioeconomic History  . Marital status: Married    Spouse name: Not on file  . Number of children: Not on file  . Years of education: Not on file  . Highest education level: Not on file  Social Needs  . Financial resource strain: Not on file  . Food insecurity - worry: Not on file  . Food insecurity - inability: Not on file  . Transportation needs - medical: Not on file  . Transportation needs - non-medical: Not on file  Occupational History  . Not on file  Tobacco Use  . Smoking status: Never Smoker  . Smokeless tobacco: Never Used  Substance and Sexual Activity  . Alcohol use: No  . Drug use: No  . Sexual activity: Yes  Other Topics Concern  . Not on file  Social History Narrative  . Not on file     Family History  Problem Relation Age of Onset  . Heart disease Mother   . Hyperlipidemia Father   . Hypertension Father     ROS: [x]  Positive   [ ]  Negative   [ ]  All sytems reviewed and are negative  Cardiac: []  chest pain/pressure []  palpitations [x]  heart failure [x]  SOB lying flat [x]  DOE [x]  afib/a flutter  Vascular: []  pain in legs while walking []  pain in legs at rest []  pain in legs at night []  non-healing ulcers []  hx of DVT [x]   swelling in legs  Pulmonary: [x]   On albuterol inhaler at home  Neurologic: []  weakness in []  arms []  legs []  numbness in []  arms []  legs []  hx of CVA []  mini stroke [] difficulty speaking or slurred speech []  temporary loss of vision in one eye []  dizziness  Hematologic: []  hx of cancer []  bleeding problems []  problems with blood clotting easily  Endocrine:   [x]  diabetes []  thyroid disease  GI []  vomiting blood []  blood in stool  GU: [x]  CKD/renal failure [x]  HD--[]  M/W/F or []  T/T/S []  burning with urination []   blood in urine  Psychiatric: []  anxiety []  depression  Musculoskeletal: []  arthritis []  joint pain  Integumentary: []  rashes []  ulcers  Constitutional: []  fever []  chills   Physical Examination  Vitals:   05/03/17 0930 05/03/17 1000  BP: 117/76 (!) 78/46  Pulse: (!) 57 (!) 54  Resp:    Temp:    SpO2:     Body mass index is 48.82 kg/m.  General:  WDWN in NAD Gait: Not observed HENT: WNL, normocephalic Pulmonary: normal non-labored breathing, without Rales, rhonchi,  wheezing Cardiac: regular Abdomen: obese Skin: blistering RUE Vascular Exam/Pulses:  Right Left  Radial 2+ (normal) 2+ (normal)  Unable to palpate pedal pulses due to una boots Extremities: RUE with blistering and is restricted.  Left forearm with IV in place Musculoskeletal: no muscle wasting or atrophy  Neurologic: A&O X 3;  No focal weakness or paresthesias are detected; speech is fluent/normal Psychiatric:  The pt has flat affect.  CBC    Component Value Date/Time   WBC 27.6 (H) 05/03/2017 0838   RBC 3.50 (L) 05/03/2017 0838   HGB 9.8 (L) 05/03/2017 0838   HCT 29.2 (L) 05/03/2017 0838   PLT 484 (H) 05/03/2017 0838   MCV 83.4 05/03/2017 0838   MCH 28.0 05/03/2017 0838   MCHC 33.6 05/03/2017 0838   RDW 17.8 (H) 05/03/2017 0838   LYMPHSABS 1.4 05/03/2017 0838   MONOABS 1.1 (H) 05/03/2017 0838   EOSABS 0.0 05/03/2017 0838   BASOSABS 0.0 05/03/2017 0838     BMET    Component Value Date/Time   NA 129 (L) 05/03/2017 0838   K 3.1 (L) 05/03/2017 0838   CL 94 (L) 05/03/2017 0838   CO2 21 (L) 05/03/2017 0838   GLUCOSE 207 (H) 05/03/2017 0838   BUN 60 (H) 05/03/2017 0838   CREATININE 5.38 (H) 05/03/2017 0838   CALCIUM 8.3 (L) 05/03/2017 0838   GFRNONAA 12 (L) 05/03/2017 0838   GFRAA 14 (L) 05/03/2017 0838    COAGS: Lab Results  Component Value Date   INR 1.8 11/11/2015   INR 1.9 10/29/2015   INR 1.5 10/15/2015     Non-Invasive Vascular Imaging:   BUE vein map and arterial duplex ordered today  Statin:  No. Beta Blocker:  Yes.   Aspirin:  No. ACEI:  No. ARB:  No. CCB use:  Yes Other antiplatelets/anticoagulants:  Yes.   Xarelto (heparin gtt currrently)   ASSESSMENT/PLAN: This is a 47 y.o. male with AKI on CKD now in need of dialysis access and tunneled dialysis catheter.     -complicated pt-he is right hand dominant and right hand is restricted due to blistering of RUA.  He does have an IV in the left forearm.  He also has leukocytosis that is being worked up and is on Prednisone as well.   -he is on heparin gtt due to afib.  Would like to create access while he is off his Xarelto, but given all his issues, he may need to wait. -will get vein mapping today to see what his options are.   [name], PA-C [facility] [phone]  I have examined the patient, reviewed and agree with above.  Discussed need for hemodialysis with the patient.  He is currently undergoing dialysis via a temporary right IJ catheter.  Explained that we would place a tunneled hemodialysis catheter tomorrow.  He will be a very difficult long-term access patient due to his morbid obesity.  He has blistering on his right upper arm  and multiple IVs in his left arm.  Will obtain vein map.  Discussed with Dr. Joelyn Oms.  Will not place arm access tomorrow.  Will allow him to become more medically stable and then plan optimal long-term  access.  Will need to have his Xarelto held until permanent access is accomplished.  Curt Jews, MD 05/03/2017 1:08 PM

## 2017-05-03 NOTE — NC FL2 (Signed)
[place] LEVEL OF CARE SCREENING TOOL     IDENTIFICATION  Patient Name: John Bailey Birthdate: 01-23-1971 Sex: male Admission Date (Current Location): 04/22/2017  [facility] and [place] Number:  Herbalist and Address:  [facility], [place] [address] [name], [place] [address]      Provider Number: [id number]  Attending Physician Name and Address:  Kerney Elbe, DO  Relative Name and Phone Number:  Romney Compean, spouse, [phone]    Current Level of Care: Hospital Recommended Level of Care: [place] Prior Approval Number:    Date Approved/Denied:   PASRR Number: [id number]  Discharge Plan: SNF    Current Diagnoses: Patient Active Problem List   Diagnosis Date Noted  . Atrial fibrillation, chronic ([place]) 04/22/2017  . Hypoxia 04/22/2017  . Excessive daytime sleepiness 04/02/2017  . Other fatigue 04/02/2017  . Bradycardia 03/28/2017  . CKD (chronic kidney disease) stage 4, GFR 15-29 ml/min (HCC) 03/28/2017  . Pseudogout 03/09/2017  . Rheumatoid arthritis ([place]) 03/09/2017  . Infected ulcer of skin, with fat layer exposed ([place]) 03/09/2017  . Normocytic anemia 03/06/2017  . Elevated troponin 03/06/2017  . Acute renal failure superimposed on stage 4 chronic kidney disease ([place]) 03/06/2017  . Essential hypertension 12/29/2016  . Persistent atrial fibrillation ([place])   . Left leg cellulitis   . Atrial fibrillation with RVR ([place]) 12/02/2016  . Sepsis ([place]) 12/02/2016  . Acute on chronic congestive heart failure ([place])   . Acute on chronic renal insufficiency   . Chronic diastolic CHF (congestive heart failure) ([place]) 03/20/2016  . Nonischemic cardiomyopathy ([place]) 11/11/2015  . Long term (current) use of anticoagulants [Z79.01] 08/14/2015  . 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 Atrial flutter with rapid ventricular response ([place]) 08/06/2015  . Hyponatremia  07/30/2015  . AKI (acute kidney injury) ([place]) 07/30/2015  . Hyperkalemia 07/30/2015  . Acute on chronic diastolic CHF (congestive heart failure) ([place]) 07/30/2015  . Morbid obesity ([place]) 07/30/2015  . Paroxysmal atrial fibrillation ([place]) 07/30/2015  . 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 Hypertensive heart disease with heart failure ([place]) 07/14/2012  . Dyspnea 07/14/2012  . Hyperlipidemia 07/14/2012    Orientation RESPIRATION BLADDER Height & Weight     Self, Time, Situation, Place  O2(nasal cannula 2L) Incontinent Weight: (floor to weigh ) Height:  6' (182.9 cm)  BEHAVIORAL SYMPTOMS/MOOD NEUROLOGICAL BOWEL NUTRITION STATUS      Incontinent Diet(please see DC summary)  AMBULATORY STATUS COMMUNICATION OF NEEDS Skin   Extensive Assist Verbally PU Stage and Appropriate Care(non-pressure wounds/diabetic ulcers bilateral legs; compression wraps)                       Personal Care Assistance Level of Assistance  Bathing, Feeding, Dressing Bathing Assistance: Maximum assistance Feeding assistance: Limited assistance Dressing Assistance: Maximum assistance     Functional Limitations Info  Sight, Hearing, Speech Sight Info: Adequate Hearing Info: Adequate Speech Info: Adequate    SPECIAL CARE FACTORS FREQUENCY  PT (By licensed PT), OT (By licensed OT)     PT Frequency: 5x/week OT Frequency: 5x/week            Contractures Contractures Info: Not present    Additional Factors Info  Code Status, Allergies, Insulin Sliding Scale Code Status Info: Full Allergies Info: No Known Allergies   Insulin Sliding Scale Info: insulin 3x/day with meals and at bedtime  Current Medications (05/03/2017):  This is the current hospital active medication list Current Facility-Administered Medications  Medication Dose Route Frequency Provider Last Rate Last Dose  . 0.9 %  sodium chloride infusion  250 mL Intravenous PRN Crosley, Debby, MD       . 0.9 %  sodium chloride infusion  250 mL Intravenous Continuous Shirley Friar, PA-C      . acetaminophen (TYLENOL) tablet 650 mg  650 mg Oral Q4H PRN Quintella Baton, MD   650 mg at 04/29/17 1723  . amiodarone (PACERONE) tablet 400 mg  400 mg Oral BID Larey Dresser, MD   400 mg at 05/03/17 1423  . carvedilol (COREG) tablet 18.75 mg  18.75 mg Oral BID WC Larey Dresser, MD   18.75 mg at 05/02/17 1706  . colchicine tablet 0.3 mg  0.3 mg Oral Daily Crosley, Debby, MD   0.3 mg at 05/02/17 0836  . fluticasone (FLONASE) 50 MCG/ACT nasal spray 2 spray  2 spray Each Nare Daily Quintella Baton, MD   2 spray at 04/29/17 1030  . gabapentin (NEURONTIN) capsule 300 mg  300 mg Oral BID Crosley, Debby, MD   300 mg at 05/03/17 1423  . heparin ADULT infusion 100 units/mL (25000 units/242mL sodium chloride 0.45%)  2,450 Units/hr Intravenous Continuous Raiford Noble [place], DO 24.5 mL/hr at 05/03/17 1424 2,450 Units/hr at 05/03/17 1424  . insulin aspart (novoLOG) injection 0-15 Units  0-15 Units Subcutaneous TID WC Quintella Baton, MD   5 Units at 05/03/17 0835  . insulin aspart (novoLOG) injection 0-5 Units  0-5 Units Subcutaneous QHS Quintella Baton, MD   5 Units at 05/02/17 2240  . insulin glargine (LANTUS) injection 25 Units  25 Units Subcutaneous QHS Mariel Aloe, MD   25 Units at 05/02/17 2239  . lidocaine (PF) (XYLOCAINE) 1 % injection    PRN Markus Daft, MD   8 mL at 04/27/17 1651  . methocarbamol (ROBAXIN) tablet 500 mg  500 mg Oral Q8H PRN Mariel Aloe, MD   500 mg at 04/29/17 1845  . ondansetron (ZOFRAN) injection 4 mg  4 mg Intravenous Q6H PRN Crosley, Debby, MD      . predniSONE (DELTASONE) tablet 60 mg  60 mg Oral Q breakfast Raiford Noble [place], DO   60 mg at 05/03/17 1422  . sodium chloride flush (NS) 0.9 % injection 10-40 mL  10-40 mL Intracatheter PRN Mariel Aloe, MD   20 mL at 05/01/17 2000  . sodium chloride flush (NS) 0.9 % injection 3 mL  3 mL Intravenous Q12H Crosley,  Debby, MD   3 mL at 05/01/17 2034  . sodium chloride flush (NS) 0.9 % injection 3 mL  3 mL Intravenous PRN Crosley, Debby, MD      . sodium chloride flush (NS) 0.9 % injection 3 mL  3 mL Intravenous Q12H Shirley Friar, PA-C   3 mL at 05/01/17 2033  . sodium chloride flush (NS) 0.9 % injection 3 mL  3 mL Intravenous PRN Shirley Friar, PA-C         Discharge Medications: Please see discharge summary for a list of discharge medications.  Relevant Imaging Results:  Relevant Lab Results:   Additional Information SSN: [id number]  Estanislado Emms, LCSW

## 2017-05-03 NOTE — Progress Notes (Signed)
PROGRESS NOTE    John Bailey  IEP:[id number] DOB: February 18, 1971 DOA: 04/22/2017 PCP: [name], MD   Brief Narrative:  John Bailey is a 47 y.o.with a PMH of rheumatoid arthritis, diabetes mellitus, heart failure, atrial fibrillation. Patient presented with dyspnea and found to have a CHF exacerbation. Diuresed with worsening kidney function. Now has Fever and new leukocytosis. Infectious workup so far is unremarkable. Discussed case with ID and patient's Rheumatologist and will start patient on po Prednisone. Patient dialyzed again this AM and was taken for a Successful DCCV yesterday 04/30/17. Per my conversation with Nephrology plan to hold Dialysis tomorrow to allow rest. Cardiology sending Immunoelectrophoresis and considering technetium scan to exclude Amyloid. Patient complaining of severe diarrhea and has not been on Abx and not taking Laxatives so will r/o C Difficile and was not infectious. Likely related to Dialysis. Patient being transitioned off Amiodarone gtt to po and Cardiology recommending transitioning to po Elqiuis 5 mg po BID when all procedures including vascular access is completed Vascular surgery consulted and evaluating for Tunneled HD Access obtained Vein Mapping but awaiting until he is more stable.   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  -Weight down to 360 but not weighed today  -Echocardiogram significant for an EF of 55-60% and severe LVH with dilated left atrium  and trivial pericardial effusion; unable to assess diastolic dysfunction -IV Lasix 160 mg bolus q6h to be stopped by Nephrology with poor UOP and worsening Cr -C/w Strict I's/O's, Daily Weights, SLIV  -Patient is -18.538 Liters  -Trialysis Cath placed 04/27/16 and will undergo Dialysis again today to pull off some Volume -Vascular Surgery consulted for more permanent HD access and obtaining Vein Mapping -C/w Dialysis   Anasarca -Secondary to fluid overload. -Management above with Hemodialysis   Rheumatoid Arthritis Flare -Patient on Abatacept every Tuesday.  -Knee x-rays negative for knee effusion -Held Abatacept in setting of possible infection -Bilateral knee x-ray Negative -C/w Methocarbamol q8hprn  -C/w Gabapentin 300 mg po BID  -Discussed with patient's outpatient Rheumatologist Dr. [name] and recommending po Prednisone and tapering; Started 60 mg po Prednisone and has been on it with some improvement. Will start to taper in the next few days as patient is improving   Atrial fibrillation with RVR s/p DCCV Cardioversion and Now in RVR -C/w Heparin gtt for now until All procedures are accomplished. Vascular to still place access -Cardiology transitioned IV Amio to po -Transferred to SDU given high risk on 2/14 -C/w Carvedilol 18.75 mg po BID  -DCCV done 2/15 and converted to NSR -Recommending po Eliquis after all procedures are done  -Appreciate Cardiology Recc's   AKI on CKD Stage IV, Worsening  -Worsening and now ESRD -Per Nephro will get daily dialysis; C/w Daily UF and HD -BUN/Cr went from 64/4.04 -> 61/4.38 -> 60/4.40 -> 56/4.72 -> 60/5.38 -IV Lasix being stopped by Nephrology  -VVS for Vascular Access Assessment/Placement and Vein Mapping done today. VVS deferring until more stable  -Appreciate Nephrology Recommendations as Kidney Fxn is  worsening even on dialysis   Hyperkalemia, improved -> Now Hypokalemic  -In the setting of worsening Kidney Disease. 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  -Infectious workup so far has been negative. Patient with history of RA and likley this could be a flair.  -Kidney function worsening and would not start NSAIDs.  -Discussed with patient about starting steroids and will start Prednisone 60 mg po Daily and Taper; Will start 50 mg po Tomorrow  -Repeat Urinalysis and obtain urine culture in setting of AKI pending  -Blood Cx x2 show NGTD at 5 days  -Patient aFebrile again today  -ESR was 110 and CRP was 34.1 -Checked Uric Acid Level and was 14.2 -C/w Colchicine per Nephrology  -WBC went from 8.8 -> 21.6 -> 22.8 -> 21.9 -> 24.9 -> 27.6 -Discussed with Infectious Diseases Dr. Tommy Medal who feels Fever and WBC is related to RA -Discussed with patient's Rheumatologist Dr. [name] who recommended starting po Prednisone and continuing to hold Abatacept   Diabetes Mellitus  -C/w Lantus 25 units sq qHS and with Moderate Novolog SSI AC/HS -CBG's ranging from 131-227 and Continue to Monitor   Hyperphosphatemia -Patient's Phos Level was 6.8 -Expect to Correct further in Dialysis -Repeat Phos in AM   HTN with Severe LVH -BP stable -Cardiology Dr. Caryl Comes checking to r/o Amyloid given degree of LVH -Immunoelectrophoresis to check for Amyloid  Diarrhea -Patient not on Abx or Stool softeners -Having Liquidy Diarrhea and multiple Episodes and given elevated WBC (prior to Steroid initiation and Fevers) will r/o Infectious Diarrhea  -Checked C Difficile and was Negative  -GI Pathogen Panel Negative   DVT prophylaxis: Anticoagulated with Heparin gtt Code Status: FULL CODE Family Communication: No family present at bedside Disposition Plan: SNF when medically stable to  D/C   Consultants:   Cardiology Heart Failure Team  Nephrology  Interventional Radiology  Discussed Case with ID Dr. Tommy Medal  Discussed Case with Patient's Primary Rheumatologist Dr. [name]    Procedures:  ECHOCARDIOGRAM 04/25/17 ------------------------------------------------------------------- Study Conclusions  - Left ventricle: The cavity size was normal. Wall thickness was   increased in a pattern of severe LVH. Systolic function was   normal. The estimated ejection fraction was in the range of 55%   to 60%. The study is not technically sufficient to allow   evaluation of LV diastolic function. - Mitral valve: There was mild regurgitation. - Left atrium: The atrium was moderately dilated. - Pericardium, extracardiac: A trivial pericardial effusion was   identified posterior to the heart.  VEIN MAPPING  Right  Upper Extremity Vein Map  Cephalic  Segment Diameter Depth Comment  1. Axilla 3.73m 16.318m  2. Mid upper arm mm mm bandages  3. Above AC 2.41m107m.6mm15m4. In AC [facility] 30mmm  60m Below AC 3.8mm 9.88m   696mrox  forearm 3.1mm 10.051mbranc46m7. Mid forearm 2.9mm 9.41mm 50mdage30m8 distal forearm 3.2mm 2.8mm   741mrist61m9mm 2.7mm    m92mm   15msilic  Segment Diameter Depth Comment  1. Axilla mm mm NV  2. Mid upper arm mm mm NV  3. Above AC mm mm NV  4. In AC 2.1mm mm   5. Belo7mC 1.5mm15m  mm   6. Mid forearm 1.57m mm   7. Wrist 1.465mmm    mm mm    mm mm    mm mm     Left  Upper Extremity Vein Map  Cephalic  Segment Diameter Depth Comment  1. Axilla 3.24m34m0.51mm27m2. Proximal  upper 3.6 mm 13.5 mm   2. Mid upper arm 2.9mm 57m51mm  53m Above AC 2.7mm 10851mm   452mn AC 3.7mmG[facility] Health Srvcs.51m33m 5. 29mow AC 2.8mm 8.5mm 424mnch 56m Mid forearm 2.9mm 4.9mm   64mWris65m.5mm 4.24mm    m66mm   2m mm    mm mm    Basilic  Segment Diameter Depth Comment  1. Axilla mm mm nv  2. Mid upper arm mm mm nv  3. Above  AC mm mm nv  4. In AC 3.4mm mm   5. Belo59mC 4.51mm mm   6. Mid f52marm 1.4mm mm   7. Wrist 551mmm                   Preliminary results by tech - Upper Ext. Arterial Duplex Completed. Right arm - patent brachial, radial and ulnar arteries without evidence of stenosis. There is a high bifurcation of the radial and ulnar arteries in the upper arm. Left arm - patent brachial, radial and ulnar arteries without evidence of stenosis  Antimicrobials:  Anti-infectives (From admission, onward)   Start     Dose/Rate Route Frequency Ordered Stop   05/04/17 0600  ceFAZolin (ANCEF) IVPB 1 g/50 mL premix    Comments:  Send with pt to OR   1 g 100 mL/hr over 30 Minutes Intravenous To Surgery 05/03/17 1606 05/05/17 0600     Subjective: Seen and examined in dialysis and states his knee and hand pain were improved. Feels like diarrhea is from Dialysis. No CP or SOB. Had some abdominal tenderness.  Objective: Vitals:   05/03/17 1230 05/03/17 1253 05/03/17 1655 05/03/17 1921  BP: 99/69 124/72 103/79 112/81  Pulse: (!) 59 61 63 61  Resp:  11 (!) 22 16  Temp:  (!) 97.4 F (36.3 C)  (!) 97.4 F (36.3 C)  TempSrc:  Axillary  Oral  SpO2:  100% 100% 100%  Height:        Intake/Output Summary (Last 24 hours) at 05/03/2017 1946 Last data filed at 05/03/2017 1412 Gross per 24 hour  Intake 360 ml  Output 3783 ml  Net -3423 ml   Filed Weights   Examination: Physical Exam:  Constitutional: WN/WD Morbidly obese AAM who is extremely swollen still but in NAD watching TV in Dialysis  Eyes: Sclerae anicteric. Lids normal ENMT: External Ears and nose appear normal. Grossly normal hearing Neck: Supple with JVD; Has RIJ Trialysis HD Catheter in place Respiratory: Diminished to auscultation bilaterally; Unlabored breathing  Cardiovascular: RRR; 2-3+ LE Edema Abdomen: Distended and slightly firm to palpate. Slightly tender. Bowel sounds present GU: [place] Musculoskeletal: No  contractures; No cyanosis but is very swollen still Skin: Warm and Dry. LE wrapped and bandaged Neurologic: CN 2-12 grossly intact Psychiatric: Normal mood and affect. Intact judgement and insight  Data Reviewed: I have personally reviewed following labs and imaging studies  CBC: Recent Labs  Lab 04/29/17 0443 04/30/17 0458 04/30/17 2044 05/01/17 0528 05/02/17 0750 05/03/17 0838  WBC 22.8* 21.9* 24.6* 24.9* 27.0* 27.6*  NEUTROABS 19.4* 20.1*  --  21.0* 22.1* 25.1*  HGB 10.3* 9.7* 9.8* 9.5*  9.8* 9.8*  HCT 30.1* 29.1* 29.1* 28.2* 29.4* 29.2*  MCV 83.4 83.9 83.1 82.7 83.3 83.4  PLT 359 361 414* 466* 503* 594*   Basic Metabolic Panel: Recent Labs  Lab 04/29/17 0443 04/30/17 0458 05/01/17 0528 05/02/17 0750 05/03/17 0838  NA 132* 130* 131*  131* 131* 129*  K 4.3 4.1 3.6  3.6 3.5 3.1*  CL 95* 94* 94*  94* 95* 94*  CO2 '22 22 22  22 22 ' 21*  GLUCOSE 91 166* 177*  183* 139* 207*  BUN 64* 61* 60*  61* 56* 60*  CREATININE 4.04* 4.38* 4.40*  4.40* 4.72* 5.38*  CALCIUM 8.3* 8.4* 8.6*  8.6* 8.3* 8.3*  MG 2.2 2.2 2.3 2.3 2.3  PHOS 6.0* 7.2* 7.1*  7.1* 5.8* 6.5*   GFR: Estimated Creatinine Clearance: 27.7 mL/min (A) (by C-G formula based on SCr of 5.38 mg/dL (H)). Liver Function Tests: Recent Labs  Lab 04/29/17 0443 04/30/17 0458 05/01/17 0528 05/02/17 0750 05/03/17 0838  AST 13* 14* 10* 11* 10*  ALT 6* 8* 7* <5* 7*  ALKPHOS 80 81 80 83 80  BILITOT 0.9 0.7 0.8 0.6 0.5  PROT 6.2* 5.8* 6.2* 6.2* 5.7*  ALBUMIN 2.1* 1.9* 2.0*  2.0* 2.2* 2.3*   No results for input(s): LIPASE, AMYLASE in the last 168 hours. No results for input(s): AMMONIA in the last 168 hours. Coagulation Profile: No results for input(s): INR, PROTIME in the last 168 hours. Cardiac Enzymes: No results for input(s): CKTOTAL, CKMB, CKMBINDEX, TROPONINI in the last 168 hours. BNP (last 3 results) No results for input(s): PROBNP in the last 8760 hours. HbA1C: No results for input(s): HGBA1C in  the last 72 hours. CBG: Recent Labs  Lab 05/02/17 1650 05/02/17 2209 05/03/17 0816 05/03/17 1410 05/03/17 1654  GLUCAP 254* 309* 227* 131* 139*   Lipid Profile: No results for input(s): CHOL, HDL, LDLCALC, TRIG, CHOLHDL, LDLDIRECT in the last 72 hours. Thyroid Function Tests: No results for input(s): TSH, T4TOTAL, FREET4, T3FREE, THYROIDAB in the last 72 hours. Anemia Panel: No results for input(s): VITAMINB12, FOLATE, FERRITIN, TIBC, IRON, RETICCTPCT in the last 72 hours. Sepsis Labs: No results for input(s): PROCALCITON, LATICACIDVEN in the last 168 hours.  Recent Results (from the past 240 hour(s))  Respiratory Panel by PCR     Status: None   Collection Time: 04/25/17 10:50 AM  Result Value Ref Range Status   Adenovirus NOT DETECTED NOT DETECTED Final   Coronavirus 229E NOT DETECTED NOT DETECTED Final   Coronavirus HKU1 NOT DETECTED NOT DETECTED Final   Coronavirus NL63 NOT DETECTED NOT DETECTED Final   Coronavirus OC43 NOT DETECTED NOT DETECTED Final   Metapneumovirus NOT DETECTED NOT DETECTED Final   Rhinovirus / Enterovirus NOT DETECTED NOT DETECTED Final   Influenza A NOT DETECTED NOT DETECTED Final   Influenza A H1 NOT DETECTED NOT DETECTED Final   Influenza A H1 2009 NOT DETECTED NOT DETECTED Final   Influenza A H3 NOT DETECTED NOT DETECTED Final   Influenza B NOT DETECTED NOT DETECTED Final   Parainfluenza Virus 1 NOT DETECTED NOT DETECTED Final   Parainfluenza Virus 2 NOT DETECTED NOT DETECTED Final   Parainfluenza Virus 3 NOT DETECTED NOT DETECTED Final   Parainfluenza Virus 4 NOT DETECTED NOT DETECTED Final   Respiratory Syncytial Virus NOT DETECTED NOT DETECTED Final   Bordetella pertussis NOT DETECTED NOT DETECTED Final   Chlamydophila pneumoniae NOT DETECTED NOT DETECTED Final   Mycoplasma pneumoniae NOT DETECTED NOT DETECTED Final  Comment: Performed at [facility], [place] [address], [place], [place] [address]  Culture, blood (routine x 2)      Status: None   Collection Time: 04/26/17 12:10 AM  Result Value Ref Range Status   Specimen Description BLOOD LEFT HAND  Final   Special Requests IN PEDIATRIC BOTTLE Blood Culture adequate volume  Final   Culture   Final    NO GROWTH 5 DAYS Performed at [facility], [place] [address] [place], [place] [address]    Report Status 05/01/2017 FINAL  Final  Culture, blood (routine x 2)     Status: None   Collection Time: 04/26/17 12:18 AM  Result Value Ref Range Status   Specimen Description BLOOD LEFT HAND  Final   Special Requests IN PEDIATRIC BOTTLE Blood Culture adequate volume  Final   Culture   Final    NO GROWTH 5 DAYS Performed at [facility], [place] [address], [place], [place] [address]    Report Status 05/01/2017 FINAL  Final  Gastrointestinal Panel by PCR , Stool     Status: None   Collection Time: 05/02/17  2:42 PM  Result Value Ref Range Status   Campylobacter species NOT DETECTED NOT DETECTED Final   Plesimonas shigelloides NOT DETECTED NOT DETECTED Final   Salmonella species NOT DETECTED NOT DETECTED Final   Yersinia enterocolitica NOT DETECTED NOT DETECTED Final   Vibrio species NOT DETECTED NOT DETECTED Final   Vibrio cholerae NOT DETECTED NOT DETECTED Final   Enteroaggregative E coli (EAEC) NOT DETECTED NOT DETECTED Final   Enteropathogenic E coli (EPEC) NOT DETECTED NOT DETECTED Final   Enterotoxigenic E coli (ETEC) NOT DETECTED NOT DETECTED Final   Shiga like toxin producing E coli (STEC) NOT DETECTED NOT DETECTED Final   Shigella/Enteroinvasive E coli (EIEC) NOT DETECTED NOT DETECTED Final   Cryptosporidium NOT DETECTED NOT DETECTED Final   Cyclospora cayetanensis NOT DETECTED NOT DETECTED Final   Entamoeba histolytica NOT DETECTED NOT DETECTED Final   Giardia lamblia NOT DETECTED NOT DETECTED Final   Adenovirus F40/41 NOT DETECTED NOT DETECTED Final   Astrovirus NOT DETECTED NOT DETECTED Final   Norovirus GI/GII NOT DETECTED NOT DETECTED Final    Rotavirus A NOT DETECTED NOT DETECTED Final   Sapovirus (I, II, IV, and V) NOT DETECTED NOT DETECTED Final    Comment: Performed at [facility], [place] [place], [place] [address]  C difficile quick scan w PCR reflex     Status: None   Collection Time: 05/02/17  2:42 PM  Result Value Ref Range Status   C Diff antigen NEGATIVE NEGATIVE Final   C Diff toxin NEGATIVE NEGATIVE Final   C Diff interpretation No C. difficile detected.  Final    Comment: Performed at [facility], [place] [address], [place], [place] [address]    Radiology Studies: Vas [place] Upper Extremity Arterial Duplex  Result Date: 05/03/2017 UPPER EXTREMITY DUPLEX STUDY Indications: Pre-op for AVF. Examination Guidelines: A complete evaluation includes B-mode imaging, spectral doppler, color doppler, and power doppler as needed of all accessible portions of each vessel. Bilateral testing is considered an integral part of a complete examination. Limited examinations for reoccurring indications may be performed as noted.  Right Pre-Dialysis Findings: +-----------------+----------+------------------+---------+--------------------+ Location         PSV (cm/s)Intralum. DiamTressia Danas Comments                                        (  mm)                                            +-----------------+----------+------------------+---------+--------------------+ Brachial Antecub.50                          triphasicproximal upper arm   fossa                                                                      +-----------------+----------+------------------+---------+--------------------+ Radial Art at    51        2.60              biphasic high bifurcation in  Wrist                                                 the upper arm        +-----------------+----------+------------------+---------+--------------------+ Ulnar Art at     44        3.00              biphasic high bifurcation in   Wrist                                                 the upper arm        +-----------------+----------+------------------+---------+--------------------+ Left Pre-Dialysis Findings: +-----------------------+----------+--------------------+---------+--------+ Location               PSV (cm/s)Intralum. Diam. (mm)Waveform Comments +-----------------------+----------+--------------------+---------+--------+ Brachial Antecub. fossa47        6.20                triphasic         +-----------------------+----------+--------------------+---------+--------+ Radial Art at Wrist    67        3.20                                  +-----------------------+----------+--------------------+---------+--------+ Ulnar Art at Wrist     73        4.70                triphasic         +-----------------------+----------+--------------------+---------+--------+ Final Interpretation:  Right: No obstruction visualized in the right upper extremity. Left: No obstruction visualized in the left upper extremity. *See table(s) above for measurements and observations. Electronically signed by Curt Jews on 05/03/2017 at 7:11:24 PM.  ** Final **  Vas [place] Upper Ext Vein Mapping (pre-op Avf)  Result Date: 05/03/2017 UPPER EXTREMITY VEIN MAPPING History: Pre-access. Examination Guidelines: A complete evaluation includes B-mode imaging, spectral doppler, color doppler, and power doppler as needed of all accessible portions of each vessel. Bilateral testing is considered an integral part of a complete examination. Limited examinations for reoccurring indications may be performed as noted. +-----------------+-------------+----------+---------+ Right Cephalic  Diameter (mm)Depth (mm)Findings  +-----------------+-------------+----------+---------+ Shoulder             3.80        6.30             +-----------------+-------------+----------+---------+ Prox upper arm       3.10       11.80              +-----------------+-------------+----------+---------+ Mid upper arm                           bandages  +-----------------+-------------+----------+---------+ Dist upper arm       2.40        4.60             +-----------------+-------------+----------+---------+ Antecubital fossa    1.70        9.40             +-----------------+-------------+----------+---------+ Prox forearm         3.80        9.60             +-----------------+-------------+----------+---------+ Mid forearm          2.90        9.40   branching +-----------------+-------------+----------+---------+ Dist forearm         3.20        2.80             +-----------------+-------------+----------+---------+ Wrist                1.90        2.70             +-----------------+-------------+----------+---------+ +-----------------+-------------+----------+--------------+ Right Basilic    Diameter (mm)Depth (mm)   Findings    +-----------------+-------------+----------+--------------+ Shoulder                                not visualized +-----------------+-------------+----------+--------------+ Prox upper arm                          not visualized +-----------------+-------------+----------+--------------+ Mid upper arm                           not visualized +-----------------+-------------+----------+--------------+ Dist upper arm                          not visualized +-----------------+-------------+----------+--------------+ Antecubital fossa                       not visualized +-----------------+-------------+----------+--------------+ Prox forearm         2.10                              +-----------------+-------------+----------+--------------+ Mid forearm          1.50                              +-----------------+-------------+----------+--------------+ Distal forearm       1.60                               +-----------------+-------------+----------+--------------+ Wrist                1.40                              +-----------------+-------------+----------+--------------+ +-----------------+-------------+----------+---------+  Left Cephalic    Diameter (mm)Depth (mm)Findings  +-----------------+-------------+----------+---------+ Shoulder             3.10       20.00             +-----------------+-------------+----------+---------+ Prox upper arm       3.60       13.50             +-----------------+-------------+----------+---------+ Mid upper arm        2.90       10.00             +-----------------+-------------+----------+---------+ Dist upper arm       2.70       10.00             +-----------------+-------------+----------+---------+ Antecubital fossa    3.70        3.00             +-----------------+-------------+----------+---------+ Prox forearm         2.80        8.50   branching +-----------------+-------------+----------+---------+ Mid forearm          2.90        4.90             +-----------------+-------------+----------+---------+ Dist forearm         2.80        4.20             +-----------------+-------------+----------+---------+ Wrist                3.50        4.10             +-----------------+-------------+----------+---------+ +-----------------+-------------+----------+--------------+ Left Basilic     Diameter (mm)Depth (mm)   Findings    +-----------------+-------------+----------+--------------+ Shoulder                                not visualized +-----------------+-------------+----------+--------------+ Prox upper arm                          not visualized +-----------------+-------------+----------+--------------+ Mid upper arm                           not visualized +-----------------+-------------+----------+--------------+ Dist upper arm                          not visualized  +-----------------+-------------+----------+--------------+ Antecubital fossa    3.40                              +-----------------+-------------+----------+--------------+ Prox forearm         4.00                              +-----------------+-------------+----------+--------------+ Mid forearm          1.40                              +-----------------+-------------+----------+--------------+ Distal forearm                          not visualized +-----------------+-------------+----------+--------------+ Elbow  not visualized +-----------------+-------------+----------+--------------+ Wrist                                   not visualized +-----------------+-------------+----------+--------------+ Final Interpretation: *See table(s) above for measurements and observations.  Curt Jews Electronically signed by Curt Jews on 05/03/2017 at 7:10:36 PM.  ** Final **  Scheduled Meds: . amiodarone  400 mg Oral BID  . carvedilol  18.75 mg Oral BID WC  . colchicine  0.3 mg Oral Daily  . fluticasone  2 spray Each Nare Daily  . gabapentin  300 mg Oral BID  . insulin aspart  0-15 Units Subcutaneous TID WC  . insulin aspart  0-5 Units Subcutaneous QHS  . insulin glargine  25 Units Subcutaneous QHS  . predniSONE  60 mg Oral Q breakfast  . sodium chloride flush  3 mL Intravenous Q12H  . sodium chloride flush  3 mL Intravenous Q12H   Continuous Infusions: . sodium chloride    . sodium chloride    . [START ON 05/04/2017]  ceFAZolin (ANCEF) IV    . heparin 2,450 Units/hr (05/03/17 1817)    LOS: 11 days   Kerney Elbe, DO [facility] Pager [phone]  If 7PM-7AM, please contact night-coverage [email or web address] Password [facility] 05/03/2017, 7:46 PM

## 2017-05-03 NOTE — H&P (View-Only) (Signed)
Hospital Consult    Reason for Consult:  In need of dialysis access and [facility] Requesting Physician:  Joelyn Oms MRN #:  [id number]  History of Present Illness: This is a 47 y.o. male who was admitted to the hospital on 04/22/17 with hypoxia and generalized edema.  He has a hx of CKD IV and is followed by Dr. Moshe Cipro.   Since being admitted to the hospital, he has been on HD.  He has tolerated this and had significant UF of over 15L since admit and was initially 100lb above dry weight.   He has hx of chronic combined systolic and diastolic heart failure with improved EF of 50-55% in 2018.  He is on insulin for DM.  He has a hx of atrial flutter and is on Xarelto at home.  He has a hx of DCCV in 2017 & 2018 with recurrent afib.  He is currently on heparin gtt.  He is morbidly obese.  He has never smoked.  He is on a beta blocker & CCB for blood pressure management/heart failure.  He is on Abatacept for RA.    Pt does have leukocytosis suspect RA flare or gout flare.  Pt's infectious workup negative so far.  He is on Prednisone.    Pt is right hand dominant.   Past Medical History:  Diagnosis Date  . Atrial flutter with rapid ventricular response ([place]) 08/06/2015   a. s/p DCCV in 07/2015 with recurrent atrial fibrillation and DCCV in 12/2016. Initially successful but noted to be back in atrial fibrillation within 2 weeks of DCCV.   [name] Chronic diastolic (congestive) heart failure (HCC)    a. EF 50-55% by echo in 06/2016.  . Diabetes ([place])   . Hypertension     Past Surgical History:  Procedure Laterality Date  . CARDIOVERSION N/A 08/01/2015   Procedure: CARDIOVERSION;  Surgeon: Josue Hector, MD;  Location: [facility] ENDOSCOPY;  Service: Cardiovascular;  Laterality: N/A;  . CARDIOVERSION N/A 12/18/2016   Procedure: CARDIOVERSION;  Surgeon: [name], MD;  Location: [place];  Service: Cardiovascular;  Laterality: N/A;  . CHOLECYSTECTOMY    . IR FLUORO GUIDE CV LINE RIGHT  04/27/2017  .  IR US GUIDE VASC ACCESS RIGHT  04/27/2017  . TEE WITHOUT CARDIOVERSION N/A 08/01/2015   Procedure: TRANSESOPHAGEAL ECHOCARDIOGRAM (TEE);  Surgeon: Josue Hector, MD;  Location: [facility] ENDOSCOPY;  Service: Cardiovascular;  Laterality: N/A;    No Known Allergies  Prior to Admission medications   Medication Sig Start Date End Date Taking? Authorizing Provider  Abatacept 125 MG/ML SOAJ Inject 125 mg into the skin every 7 (seven) days. On Tuesday 04/13/17  Yes [provider]  acetaminophen (TYLENOL) 325 MG tablet Take 650 mg by mouth every 6 (six) hours as needed for mild pain.   Yes [provider]  albuterol (PROVENTIL HFA;VENTOLIN HFA) 108 (90 BASE) MCG/ACT inhaler Inhale 1-2 puffs into the lungs every 6 (six) hours as needed for wheezing. 05/30/14  Yes Nahser, Wonda Cheng, MD  carvedilol (COREG) 12.5 MG tablet Take 1 tablet (12.5 mg total) by mouth 2 (two) times daily with a meal. 03/31/17  Yes Allie Bossier, MD  celecoxib (CELEBREX) 100 MG capsule Take 100 mg by mouth daily. 03/04/17  Yes [provider]  colchicine 0.6 MG tablet Take 0.5 tablets (0.3 mg total) by mouth daily. 12/16/16  Yes Barton Dubois, MD  diltiazem (CARDIZEM CD) 180 MG 24 hr capsule Take 1 capsule (180 mg total) by mouth daily. 04/01/17  Yes Allie Bossier, MD  fluticasone [facility]) 50 MCG/ACT nasal spray Place 2 sprays into the nose daily.  12/29/12  Yes [provider]  furosemide (LASIX) 40 MG tablet Take 40 mg by mouth 2 (two) times daily. 03/24/17  Yes [provider]  gabapentin (NEURONTIN) 300 MG capsule Take 1 capsule (300 mg total) by mouth 2 (two) times daily. Patient taking differently: Take 300 mg by mouth 3 (three) times daily.  03/09/17  Yes Debbe Odea, MD  insulin aspart (NOVOLOG FLEXPEN) 100 UNIT/ML FlexPen Inject 0-12 Units into the skin 3 (three) times daily as needed for high blood sugar. Sliding Scale <70, initiate hypoglycemia protocol, 70-100=0 units, 110-199= 3  units, 200-250=5 units, 251-300=7 units, 301-350=10 units, 351-400=12 units, >400=14 units. Call MD if >450 03/09/17  Yes Debbe Odea, MD  Insulin Glargine (LANTUS) 100 UNIT/ML Solostar Pen Inject 25 Units into the skin daily at 10 pm. 03/09/17  Yes Rizwan, Eunice Blase, MD  potassium chloride SA (KLOR-CON M20) 20 MEQ tablet Take 1 tablet (20 mEq total) by mouth daily. 08/19/15  Yes Hilty, Nadean Corwin, MD  rivaroxaban (XARELTO) 20 MG TABS tablet take 1 tablet by mouth once daily WITH SUPPER Patient taking differently: Take 20 mg by mouth daily with supper.  10/12/16  Yes Hilty, Nadean Corwin, MD  Insulin Pen Needle 31G X 6 MM MISC 1 Device by Does not apply route 2 (two) times daily. 08/08/15   Charlynne Cousins, MD    Social History   Socioeconomic History  . Marital status: Married    Spouse name: Not on file  . Number of children: Not on file  . Years of education: Not on file  . Highest education level: Not on file  Social Needs  . Financial resource strain: Not on file  . Food insecurity - worry: Not on file  . Food insecurity - inability: Not on file  . Transportation needs - medical: Not on file  . Transportation needs - non-medical: Not on file  Occupational History  . Not on file  Tobacco Use  . Smoking status: Never Smoker  . Smokeless tobacco: Never Used  Substance and Sexual Activity  . Alcohol use: No  . Drug use: No  . Sexual activity: Yes  Other Topics Concern  . Not on file  Social History Narrative  . Not on file     Family History  Problem Relation Age of Onset  . Heart disease Mother   . Hyperlipidemia Father   . Hypertension Father     ROS: [x]  Positive   [ ]  Negative   [ ]  All sytems reviewed and are negative  Cardiac: []  chest pain/pressure []  palpitations [x]  heart failure [x]  SOB lying flat [x]  DOE [x]  afib/a flutter  Vascular: []  pain in legs while walking []  pain in legs at rest []  pain in legs at night []  non-healing ulcers []  hx of DVT [x]   swelling in legs  Pulmonary: [x]   On albuterol inhaler at home  Neurologic: []  weakness in []  arms []  legs []  numbness in []  arms []  legs []  hx of CVA []  mini stroke [] difficulty speaking or slurred speech []  temporary loss of vision in one eye []  dizziness  Hematologic: []  hx of cancer []  bleeding problems []  problems with blood clotting easily  Endocrine:   [x]  diabetes []  thyroid disease  GI []  vomiting blood []  blood in stool  GU: [x]  CKD/renal failure [x]  HD--[]  M/W/F or []  T/T/S []  burning with urination []   blood in urine  Psychiatric: []  anxiety []  depression  Musculoskeletal: []  arthritis []  joint pain  Integumentary: []  rashes []  ulcers  Constitutional: []  fever []  chills   Physical Examination  Vitals:   05/03/17 0930 05/03/17 1000  BP: 117/76 (!) 78/46  Pulse: (!) 57 (!) 54  Resp:    Temp:    SpO2:     Body mass index is 48.82 kg/m.  General:  WDWN in NAD Gait: Not observed HENT: WNL, normocephalic Pulmonary: normal non-labored breathing, without Rales, rhonchi,  wheezing Cardiac: regular Abdomen: obese Skin: blistering RUE Vascular Exam/Pulses:  Right Left  Radial 2+ (normal) 2+ (normal)  Unable to palpate pedal pulses due to una boots Extremities: RUE with blistering and is restricted.  Left forearm with IV in place Musculoskeletal: no muscle wasting or atrophy  Neurologic: A&O X 3;  No focal weakness or paresthesias are detected; speech is fluent/normal Psychiatric:  The pt has flat affect.  CBC    Component Value Date/Time   WBC 27.6 (H) 05/03/2017 0838   RBC 3.50 (L) 05/03/2017 0838   HGB 9.8 (L) 05/03/2017 0838   HCT 29.2 (L) 05/03/2017 0838   PLT 484 (H) 05/03/2017 0838   MCV 83.4 05/03/2017 0838   MCH 28.0 05/03/2017 0838   MCHC 33.6 05/03/2017 0838   RDW 17.8 (H) 05/03/2017 0838   LYMPHSABS 1.4 05/03/2017 0838   MONOABS 1.1 (H) 05/03/2017 0838   EOSABS 0.0 05/03/2017 0838   BASOSABS 0.0 05/03/2017 0838     BMET    Component Value Date/Time   NA 129 (L) 05/03/2017 0838   K 3.1 (L) 05/03/2017 0838   CL 94 (L) 05/03/2017 0838   CO2 21 (L) 05/03/2017 0838   GLUCOSE 207 (H) 05/03/2017 0838   BUN 60 (H) 05/03/2017 0838   CREATININE 5.38 (H) 05/03/2017 0838   CALCIUM 8.3 (L) 05/03/2017 0838   GFRNONAA 12 (L) 05/03/2017 0838   GFRAA 14 (L) 05/03/2017 0838    COAGS: Lab Results  Component Value Date   INR 1.8 11/11/2015   INR 1.9 10/29/2015   INR 1.5 10/15/2015     Non-Invasive Vascular Imaging:   BUE vein map and arterial duplex ordered today  Statin:  No. Beta Blocker:  Yes.   Aspirin:  No. ACEI:  No. ARB:  No. CCB use:  Yes Other antiplatelets/anticoagulants:  Yes.   Xarelto (heparin gtt currrently)   ASSESSMENT/PLAN: This is a 47 y.o. male with AKI on CKD now in need of dialysis access and tunneled dialysis catheter.     -complicated pt-he is right hand dominant and right hand is restricted due to blistering of RUA.  He does have an IV in the left forearm.  He also has leukocytosis that is being worked up and is on Prednisone as well.   -he is on heparin gtt due to afib.  Would like to create access while he is off his Xarelto, but given all his issues, he may need to wait. -will get vein mapping today to see what his options are.   [name], PA-C [facility] [phone]  I have examined the patient, reviewed and agree with above.  Discussed need for hemodialysis with the patient.  He is currently undergoing dialysis via a temporary right IJ catheter.  Explained that we would place a tunneled hemodialysis catheter tomorrow.  He will be a very difficult long-term access patient due to his morbid obesity.  He has blistering on his right upper arm  and multiple IVs in his left arm.  Will obtain vein map.  Discussed with Dr. Joelyn Oms.  Will not place arm access tomorrow.  Will allow him to become more medically stable and then plan optimal long-term  access.  Will need to have his Xarelto held until permanent access is accomplished.  Curt Jews, MD 05/03/2017 1:08 PM

## 2017-05-03 NOTE — Progress Notes (Addendum)
Patient ID: John Bailey, male   DOB: [birth year], 47 y.o.   MRN: [id number] P    Advanced Heart Failure Rounding Note  PCP-Cardiologist: Pixie Casino, MD  HF Cardiologist: [name], MD  Subjective:    DCCV on 2/15, he remains in NSR today.  Still on amiodarone gtt.   Seen at HD today.  Weight coming down with HD.   Knee feeling better but still has not been out of bed.  WBCs remain elevated.   Echo (04/25/17): EF 55-60%, severe LVH.   Objective:   Weight Range: (!) 360 lb (163.3 kg) Body mass index is 48.82 kg/m.   Vital Signs:   Temp:  [97.3 F (36.3 C)-98.5 F (36.9 C)] 97.3 F (36.3 C) (02/18 0854) Pulse Rate:  [57-76] 57 (02/18 0854) Resp:  [17-23] 17 (02/18 0429) BP: (108-130)/(69-84) 113/80 (02/18 0854) SpO2:  [91 %-100 %] 100 % (02/18 0854) Last BM Date: 05/02/17  Weight change: Filed Weights   05/01/17 0536  Weight: (!) 360 lb (163.3 kg)   Intake/Output:   Intake/Output Summary (Last 24 hours) at 05/03/2017 0934 Last data filed at 05/02/2017 2030 Gross per 24 hour  Intake 0 ml  Output -  Net 0 ml     Physical Exam    General: NAD, obese Neck: JVP 12 cm, no thyromegaly or thyroid nodule.  Lungs: Clear to auscultation bilaterally with normal respiratory effort. CV: Nondisplaced PMI.  Heart regular S1/S2, no S3/S4, no murmur.  2+ edema to thighs. Abdomen: Soft, nontender, no hepatosplenomegaly, no distention.  Skin: Intact without lesions or rashes.  Neurologic: Alert and oriented x 3.  Psych: Normal affect. Extremities: No clubbing or cyanosis.  HEENT: Normal.    Telemetry   NSR 70s, personally reviewed.    Labs    CBC Recent Labs    05/02/17 0750 05/03/17 0838  WBC 27.0* 27.6*  NEUTROABS 22.1* PENDING  HGB 9.8* 9.8*  HCT 29.4* 29.2*  MCV 83.3 83.4  PLT 503* 250*   Basic Metabolic Panel Recent Labs    05/01/17 0528 05/02/17 0750  NA 131*  131* 131*  K 3.6  3.6 3.5  CL 94*  94* 95*  CO2 '22  22 22  ' GLUCOSE 177*   183* 139*  BUN 60*  61* 56*  CREATININE 4.40*  4.40* 4.72*  CALCIUM 8.6*  8.6* 8.3*  MG 2.3 2.3  PHOS 7.1*  7.1* 5.8*   Liver Function Tests Recent Labs    05/01/17 0528 05/02/17 0750  AST 10* 11*  ALT 7* <5*  ALKPHOS 80 83  BILITOT 0.8 0.6  PROT 6.2* 6.2*  ALBUMIN 2.0*  2.0* 2.2*   No results for input(s): LIPASE, AMYLASE in the last 72 hours. Cardiac Enzymes No results for input(s): CKTOTAL, CKMB, CKMBINDEX, TROPONINI in the last 72 hours.  BNP: BNP (last 3 results) Recent Labs    03/06/17 1800 04/22/17 1734 04/23/17 0736  BNP 791.5* 531.3* 627.9*    ProBNP (last 3 results) No results for input(s): PROBNP in the last 8760 hours.   D-Dimer No results for input(s): DDIMER in the last 72 hours. Hemoglobin A1C No results for input(s): HGBA1C in the last 72 hours. Fasting Lipid Panel No results for input(s): CHOL, HDL, LDLCALC, TRIG, CHOLHDL, LDLDIRECT in the last 72 hours. Thyroid Function Tests No results for input(s): TSH, T4TOTAL, T3FREE, THYROIDAB in the last 72 hours.  Invalid input(s): FREET3  Other results:   Imaging    No results found.  Medications:     Scheduled Medications: . carvedilol  18.75 mg Oral BID WC  . colchicine  0.3 mg Oral Daily  . fluticasone  2 spray Each Nare Daily  . gabapentin  300 mg Oral BID  . insulin aspart  0-15 Units Subcutaneous TID WC  . insulin aspart  0-5 Units Subcutaneous QHS  . insulin glargine  25 Units Subcutaneous QHS  . predniSONE  60 mg Oral Q breakfast  . sodium chloride flush  3 mL Intravenous Q12H  . sodium chloride flush  3 mL Intravenous Q12H    Infusions: . sodium chloride    . sodium chloride    . amiodarone 30 mg/hr (05/03/17 0549)  . heparin 2,500 Units/hr (05/03/17 0423)    PRN Medications: sodium chloride, acetaminophen, lidocaine (PF), methocarbamol, ondansetron (ZOFRAN) IV, sodium chloride flush, sodium chloride flush, sodium chloride flush    Assessment/Plan   1.  Atrial fibrillation with RVR: On review of prior ECGs, last time he was documented to be in NSR was in 1/18. I suspect that he may be better compensated in NSR. His HR has been difficult to control.  He remains in NSR after DCCV on 2/15.   - d/c IV amiodarone, transition to po. - Continue Coreg 18.75 mg BID - He is on heparin gtt.  When procedures are completed (vascular access), would transition to Eliquis 5 mg bid based on most recent afib guidelines.  2. Acute on chronic diastolic CHF: Echo this admission with severe LVH, EF 55-60%. Marked volume overload with anasarca. Fluid now coming off with HD but he has a long way to go.  - Continue HD per renal.   - With low voltage and severe LVH, would send UPEP/SPEP, consider nuclear scan for TTR in future if negative SPEP/UPEP. 3. AKI on CKD Stage IV: I suspect that he will be dialysis dependent for volume management.  4. Fever: Afebrile today though WBCs still high.  Cultures negative.  Suspicion for RA flare, high ESR and CRP.  He has been started on prednisone for management.  Knee pain improved.   5. HTN: Coming down with volume removal.  6. Knee pain: Likely related to rheumatoid arthritis. Knee Xray with subQ edema. No acute osseous findings.   Needs mobilization with PT.   Length of Stay: 40  [name], MD  05/03/2017, 9:34 AM  Advanced Heart Failure Team Pager [phone] (M-F; 7a - 4p)  Please contact [facility] for night-coverage after hours (4p -7a ) and weekends on [email or web address]

## 2017-05-04 ENCOUNTER — Inpatient Hospital Stay (HOSPITAL_COMMUNITY): Payer: BLUE CROSS/BLUE SHIELD | Admitting: Anesthesiology

## 2017-05-04 ENCOUNTER — Inpatient Hospital Stay (HOSPITAL_COMMUNITY): Payer: BLUE CROSS/BLUE SHIELD

## 2017-05-04 ENCOUNTER — Encounter (HOSPITAL_COMMUNITY): Payer: Self-pay

## 2017-05-04 ENCOUNTER — Encounter (HOSPITAL_COMMUNITY)
Admission: EM | Disposition: A | Discharge status: Rehabilitation Facility | Payer: Self-pay | Source: Home / Self Care | Attending: Internal Medicine

## 2017-05-04 DIAGNOSIS — Z992 Dependence on renal dialysis: Secondary | ICD-10-CM

## 2017-05-04 DIAGNOSIS — Z419 Encounter for procedure for purposes other than remedying health state, unspecified: Secondary | ICD-10-CM

## 2017-05-04 HISTORY — PX: INSERTION OF DIALYSIS CATHETER: SHX1324

## 2017-05-04 LAB — PROTEIN ELECTROPHORESIS, SERUM
A/G RATIO SPE: 0.6 — AB (ref 0.7–1.7)
A/G Ratio: 0.6 — ABNORMAL LOW (ref 0.7–1.7)
ALBUMIN ELP: 2.2 g/dL — AB (ref 2.9–4.4)
ALPHA-1-GLOBULIN: 0.5 g/dL — AB (ref 0.0–0.4)
ALPHA-2-GLOBULIN: 1.1 g/dL — AB (ref 0.4–1.0)
ALPHA-2-GLOBULIN: 1 g/dL (ref 0.4–1.0)
Albumin ELP: 2.1 g/dL — ABNORMAL LOW (ref 2.9–4.4)
Alpha-1-Globulin: 0.5 g/dL — ABNORMAL HIGH (ref 0.0–0.4)
BETA GLOBULIN: 1 g/dL (ref 0.7–1.3)
Beta Globulin: 1 g/dL (ref 0.7–1.3)
GAMMA GLOBULIN: 1.1 g/dL (ref 0.4–1.8)
GLOBULIN, TOTAL: 3.6 g/dL (ref 2.2–3.9)
Gamma Globulin: 1 g/dL (ref 0.4–1.8)
Globulin, Total: 3.6 g/dL (ref 2.2–3.9)
Total Protein ELP: 5.7 g/dL — ABNORMAL LOW (ref 6.0–8.5)
Total Protein ELP: 5.8 g/dL — ABNORMAL LOW (ref 6.0–8.5)

## 2017-05-04 LAB — CBC WITH DIFFERENTIAL/PLATELET
BASOS ABS: 0 10*3/uL (ref 0.0–0.1)
Basophils Relative: 0 %
EOS PCT: 0 %
Eosinophils Absolute: 0 10*3/uL (ref 0.0–0.7)
HCT: 29.1 % — ABNORMAL LOW (ref 39.0–52.0)
Hemoglobin: 9.7 g/dL — ABNORMAL LOW (ref 13.0–17.0)
LYMPHS PCT: 5 %
Lymphs Abs: 1.7 10*3/uL (ref 0.7–4.0)
MCH: 27.8 pg (ref 26.0–34.0)
MCHC: 33.3 g/dL (ref 30.0–36.0)
MCV: 83.4 fL (ref 78.0–100.0)
Monocytes Absolute: 1 10*3/uL (ref 0.1–1.0)
Monocytes Relative: 3 %
Neutro Abs: 30.6 10*3/uL — ABNORMAL HIGH (ref 1.7–7.7)
Neutrophils Relative %: 92 %
Platelets: 539 10*3/uL — ABNORMAL HIGH (ref 150–400)
RBC: 3.49 MIL/uL — AB (ref 4.22–5.81)
RDW: 18.2 % — ABNORMAL HIGH (ref 11.5–15.5)
WBC: 33.3 10*3/uL — AB (ref 4.0–10.5)

## 2017-05-04 LAB — COMPREHENSIVE METABOLIC PANEL
ALT: 8 U/L — AB (ref 17–63)
AST: 12 U/L — AB (ref 15–41)
Albumin: 2.3 g/dL — ABNORMAL LOW (ref 3.5–5.0)
Alkaline Phosphatase: 70 U/L (ref 38–126)
Anion gap: 16 — ABNORMAL HIGH (ref 5–15)
BUN: 58 mg/dL — ABNORMAL HIGH (ref 6–20)
CHLORIDE: 94 mmol/L — AB (ref 101–111)
CO2: 21 mmol/L — ABNORMAL LOW (ref 22–32)
CREATININE: 5.58 mg/dL — AB (ref 0.61–1.24)
Calcium: 8.5 mg/dL — ABNORMAL LOW (ref 8.9–10.3)
GFR calc non Af Amer: 11 mL/min — ABNORMAL LOW (ref >60)
GFR, EST AFRICAN AMERICAN: 13 mL/min — AB (ref >60)
Glucose, Bld: 217 mg/dL — ABNORMAL HIGH (ref 65–99)
Potassium: 3.5 mmol/L (ref 3.5–5.1)
SODIUM: 131 mmol/L — AB (ref 135–145)
Total Bilirubin: 0.6 mg/dL (ref 0.3–1.2)
Total Protein: 6.1 g/dL — ABNORMAL LOW (ref 6.5–8.1)

## 2017-05-04 LAB — MAGNESIUM: Magnesium: 2.3 mg/dL (ref 1.7–2.4)

## 2017-05-04 LAB — HEPARIN LEVEL (UNFRACTIONATED): Heparin Unfractionated: 0.73 IU/mL — ABNORMAL HIGH (ref 0.30–0.70)

## 2017-05-04 LAB — GLUCOSE, CAPILLARY
GLUCOSE-CAPILLARY: 161 mg/dL — AB (ref 65–99)
Glucose-Capillary: 170 mg/dL — ABNORMAL HIGH (ref 65–99)
Glucose-Capillary: 162 mg/dL — ABNORMAL HIGH (ref 65–99)
Glucose-Capillary: 192 mg/dL — ABNORMAL HIGH (ref 65–99)

## 2017-05-04 LAB — SURGICAL PCR SCREEN
MRSA, PCR: NEGATIVE
Staphylococcus aureus: NEGATIVE

## 2017-05-04 LAB — PROTIME-INR
INR: 1.34
Prothrombin Time: 16.5 seconds — ABNORMAL HIGH (ref 11.4–15.2)

## 2017-05-04 LAB — PHOSPHORUS: PHOSPHORUS: 6.9 mg/dL — AB (ref 2.5–4.6)

## 2017-05-04 SURGERY — INSERTION OF DIALYSIS CATHETER
Anesthesia: Monitor Anesthesia Care | Site: Chest | Laterality: Right

## 2017-05-04 MED ORDER — LIDOCAINE HCL (PF) 1 % IJ SOLN
INTRAMUSCULAR | Status: AC
  Filled 2017-05-04: qty 30

## 2017-05-04 MED ORDER — SODIUM CHLORIDE 0.9 % IV SOLN
INTRAVENOUS | Status: DC | PRN
  Administered 2017-05-04: 50 ug/min via INTRAVENOUS

## 2017-05-04 MED ORDER — 0.9 % SODIUM CHLORIDE (POUR BTL) OPTIME
TOPICAL | Status: DC | PRN
  Administered 2017-05-04: 1000 mL

## 2017-05-04 MED ORDER — GABAPENTIN 300 MG PO CAPS: 300.0000 mg | ORAL_CAPSULE | Freq: Every day | ORAL | Status: DC

## 2017-05-04 MED ORDER — DEXTROSE 5 % IV SOLN
3.0000 g | INTRAVENOUS | Status: AC
  Administered 2017-05-04: 3 g via INTRAVENOUS
  Filled 2017-05-04: qty 3000

## 2017-05-04 MED ORDER — GABAPENTIN 300 MG PO CAPS
300.0000 mg | ORAL_CAPSULE | Freq: Every day | ORAL | Status: DC
  Administered 2017-05-04 – 2017-05-13 (×10): 300 mg via ORAL
  Filled 2017-05-04 (×11): qty 1

## 2017-05-04 MED ORDER — HEPARIN (PORCINE) IN NACL 100-0.45 UNIT/ML-% IJ SOLN: 2350.0000 [IU]/h | INTRAMUSCULAR | Status: DC

## 2017-05-04 MED ORDER — HEPARIN SODIUM (PORCINE) 1000 UNIT/ML IJ SOLN
INTRAMUSCULAR | Status: AC
  Filled 2017-05-04: qty 1

## 2017-05-04 MED ORDER — HEPARIN SODIUM (PORCINE) 1000 UNIT/ML IJ SOLN
INTRAMUSCULAR | Status: DC | PRN
  Administered 2017-05-04: 1000 [IU]

## 2017-05-04 MED ORDER — HEPARIN (PORCINE) IN NACL 100-0.45 UNIT/ML-% IJ SOLN
2350.0000 [IU]/h | INTRAMUSCULAR | Status: AC
  Administered 2017-05-04 – 2017-05-05 (×2): 2350 [IU]/h via INTRAVENOUS
  Filled 2017-05-04 (×2): qty 250

## 2017-05-04 MED ORDER — HEPARIN SODIUM (PORCINE) 1000 UNIT/ML DIALYSIS
20.0000 [IU]/kg | INTRAMUSCULAR | Status: DC | PRN
Start: 2017-05-04 — End: 2017-05-04

## 2017-05-04 MED ORDER — MIDAZOLAM HCL 5 MG/5ML IJ SOLN
INTRAMUSCULAR | Status: DC | PRN
  Administered 2017-05-04 (×2): 0.5 mg via INTRAVENOUS

## 2017-05-04 MED ORDER — DEXMEDETOMIDINE HCL 200 MCG/2ML IV SOLN
INTRAVENOUS | Status: DC | PRN
  Administered 2017-05-04: 12 ug via INTRAVENOUS

## 2017-05-04 MED ORDER — SODIUM CHLORIDE 0.9 % IV SOLN: 100.0000 mL | INTRAVENOUS | Status: DC | PRN

## 2017-05-04 MED ORDER — LIDOCAINE-EPINEPHRINE (PF) 1 %-1:200000 IJ SOLN
INTRAMUSCULAR | Status: DC | PRN
  Administered 2017-05-04: 20 mL via INTRADERMAL

## 2017-05-04 MED ORDER — PROMETHAZINE HCL 25 MG/ML IJ SOLN: 6.2500 mg | INTRAMUSCULAR | Status: DC | PRN

## 2017-05-04 MED ORDER — HEPARIN SODIUM (PORCINE) 1000 UNIT/ML DIALYSIS: 1000.0000 [IU] | INTRAMUSCULAR | Status: DC | PRN

## 2017-05-04 MED ORDER — FENTANYL CITRATE (PF) 100 MCG/2ML IJ SOLN: 25.0000 ug | INTRAMUSCULAR | Status: DC | PRN

## 2017-05-04 MED ORDER — SODIUM CHLORIDE 0.9 % IV SOLN
INTRAVENOUS | Status: DC
  Administered 2017-05-04: 12:00:00 via INTRAVENOUS

## 2017-05-04 MED ORDER — HEPARIN SODIUM (PORCINE) 5000 UNIT/ML IJ SOLN
INTRAMUSCULAR | Status: DC | PRN
  Administered 2017-05-04: 15:00:00 500 mL

## 2017-05-04 MED ORDER — PHENYLEPHRINE 40 MCG/ML (10ML) SYRINGE FOR IV PUSH (FOR BLOOD PRESSURE SUPPORT)
PREFILLED_SYRINGE | INTRAVENOUS | Status: AC
  Filled 2017-05-04: qty 20

## 2017-05-04 MED ORDER — FENTANYL CITRATE (PF) 250 MCG/5ML IJ SOLN
INTRAMUSCULAR | Status: AC
  Filled 2017-05-04: qty 5

## 2017-05-04 MED ORDER — LIDOCAINE HCL (PF) 1 % IJ SOLN: 5.0000 mL | INTRAMUSCULAR | Status: DC | PRN

## 2017-05-04 MED ORDER — SODIUM CHLORIDE 0.9 % IV SOLN
INTRAVENOUS | Status: DC | PRN
  Administered 2017-05-04: 14:00:00 via INTRAVENOUS

## 2017-05-04 MED ORDER — HEPARIN SODIUM (PORCINE) 1000 UNIT/ML DIALYSIS: 20.0000 [IU]/kg | INTRAMUSCULAR | Status: DC | PRN

## 2017-05-04 MED ORDER — LIDOCAINE-PRILOCAINE 2.5-2.5 % EX CREA: 1.0000 "application " | TOPICAL_CREAM | CUTANEOUS | Status: DC | PRN

## 2017-05-04 MED ORDER — PREDNISONE 20 MG PO TABS
50.0000 mg | ORAL_TABLET | Freq: Every day | ORAL | Status: DC
  Administered 2017-05-05: 50 mg via ORAL
  Filled 2017-05-04: qty 1

## 2017-05-04 MED ORDER — ALTEPLASE 2 MG IJ SOLR: 2.0000 mg | Freq: Once | INTRAMUSCULAR | Status: DC | PRN

## 2017-05-04 MED ORDER — PHENYLEPHRINE HCL 10 MG/ML IJ SOLN
INTRAMUSCULAR | Status: DC | PRN
  Administered 2017-05-04: 40 ug via INTRAVENOUS
  Administered 2017-05-04: 120 ug via INTRAVENOUS
  Administered 2017-05-04: 80 ug via INTRAVENOUS
  Administered 2017-05-04 (×2): 40 ug via INTRAVENOUS

## 2017-05-04 MED ORDER — EPHEDRINE SULFATE 50 MG/ML IJ SOLN
INTRAMUSCULAR | Status: DC | PRN
  Administered 2017-05-04: 10 mg via INTRAVENOUS

## 2017-05-04 MED ORDER — MIDAZOLAM HCL 2 MG/2ML IJ SOLN
INTRAMUSCULAR | Status: AC
  Filled 2017-05-04: qty 2

## 2017-05-04 MED ORDER — PENTAFLUOROPROP-TETRAFLUOROETH EX AERO: 1.0000 "application " | INHALATION_SPRAY | CUTANEOUS | Status: DC | PRN

## 2017-05-04 SURGICAL SUPPLY — 43 items
ADH SKN CLS APL DERMABOND .7 (GAUZE/BANDAGES/DRESSINGS) ×1
BAG DECANTER FOR FLEXI CONT (MISCELLANEOUS) ×3 IMPLANT
BIOPATCH RED 1 DISK 7.0 (GAUZE/BANDAGES/DRESSINGS) ×2 IMPLANT
BIOPATCH RED 1IN DISK 7.0MM (GAUZE/BANDAGES/DRESSINGS) ×1
CATH PALINDROME RT-P 15FX19CM (CATHETERS) IMPLANT
CATH PALINDROME RT-P 15FX23CM (CATHETERS) ×3 IMPLANT
CATH PALINDROME RT-P 15FX28CM (CATHETERS) IMPLANT
CATH PALINDROME RT-P 15FX55CM (CATHETERS) IMPLANT
CATH STRAIGHT 5FR 65CM (CATHETERS) IMPLANT
COVER PROBE W GEL 5X96 (DRAPES) ×3 IMPLANT
COVER SURGICAL LIGHT HANDLE (MISCELLANEOUS) ×3 IMPLANT
DECANTER SPIKE VIAL GLASS SM (MISCELLANEOUS) ×1 IMPLANT
DERMABOND ADVANCED (GAUZE/BANDAGES/DRESSINGS) ×2
DERMABOND ADVANCED .7 DNX12 (GAUZE/BANDAGES/DRESSINGS) ×1 IMPLANT
DRAPE C-ARM 42X72 X-RAY (DRAPES) ×3 IMPLANT
DRAPE CHEST BREAST 15X10 FENES (DRAPES) ×3 IMPLANT
GAUZE SPONGE 4X4 16PLY XRAY LF (GAUZE/BANDAGES/DRESSINGS) ×3 IMPLANT
GLOVE BIO SURGEON STRL SZ7 (GLOVE) ×3 IMPLANT
GLOVE BIOGEL PI IND STRL 7.5 (GLOVE) ×1 IMPLANT
GLOVE BIOGEL PI INDICATOR 7.5 (GLOVE) ×2
GOWN STRL REUS W/ TWL LRG LVL3 (GOWN DISPOSABLE) ×2 IMPLANT
GOWN STRL REUS W/TWL LRG LVL3 (GOWN DISPOSABLE) ×6
KIT BASIN OR (CUSTOM PROCEDURE TRAY) ×3 IMPLANT
KIT ROOM TURNOVER OR (KITS) ×3 IMPLANT
NDL 18GX1X1/2 (RX/OR ONLY) (NEEDLE) ×1 IMPLANT
NEEDLE 18GX1X1/2 (RX/OR ONLY) (NEEDLE) ×3 IMPLANT
NEEDLE HYPO 25GX1X1/2 BEV (NEEDLE) ×3 IMPLANT
NS IRRIG 1000ML POUR BTL (IV SOLUTION) ×3 IMPLANT
PACK SURGICAL SETUP 50X90 (CUSTOM PROCEDURE TRAY) ×3 IMPLANT
PAD ARMBOARD 7.5X6 YLW CONV (MISCELLANEOUS) ×6 IMPLANT
SET MICROPUNCTURE 5F STIFF (MISCELLANEOUS) IMPLANT
SOAP 2 % CHG 4 OZ (WOUND CARE) ×3 IMPLANT
SUT ETHILON 3 0 PS 1 (SUTURE) ×3 IMPLANT
SUT MNCRL AB 4-0 PS2 18 (SUTURE) ×3 IMPLANT
SYR 10ML LL (SYRINGE) ×3 IMPLANT
SYR 20CC LL (SYRINGE) ×6 IMPLANT
SYR 3ML LL SCALE MARK (SYRINGE) ×3 IMPLANT
SYR 5ML LL (SYRINGE) ×3 IMPLANT
SYR CONTROL 10ML LL (SYRINGE) ×3 IMPLANT
TOWEL GREEN STERILE (TOWEL DISPOSABLE) ×3 IMPLANT
TOWEL GREEN STERILE FF (TOWEL DISPOSABLE) ×3 IMPLANT
WATER STERILE IRR 1000ML POUR (IV SOLUTION) ×3 IMPLANT
WIRE AMPLATZ SS-J .035X180CM (WIRE) IMPLANT

## 2017-05-04 NOTE — Anesthesia Preprocedure Evaluation (Addendum)
Anesthesia Evaluation  Patient identified by MRN, date of birth, ID band Patient awake  General Assessment Comment:1. Atrial fibrillation with RVR: On review of prior ECGs, last time he was documented to be in NSR was in 1/18. I suspect that he may be better compensated in NSR. His HR has been difficult to control.  He remains in NSR after DCCV on 2/15.   - Continue PO amio 400 mg BID - Continue Coreg 18.75 mg BID - He is on heparin gtt.  When procedures are completed (vascular access),  transition to Eliquis 5 mg bid based on most recent afib guidelines. 2. Acute on chronic diastolic CHF: Echo this admission with severe LVH, EF 55-60%. Marked volume overload with anasarca. Fluid now coming off with HD but he has a long way to go.  - Continue HD per renal.  No change. - With low voltage and severe LVH, would send UPEP/SPEP, consider nuclear scan for TTR in future if negative SPEP/UPEP. Pending. 3. AKI on CKD Stage IV: I suspect that he will be dialysis dependent for volume management. No change. 4. Fever: Afebrile today though WBCs still high.  Cultures negative.  Suspicion for RA flare, high ESR and CRP.  He has been started on prednisone for management.  Knee pain improved.  Afebrile. WBC trending up > 33 this am. 5. HTN: Improved with volume removal. SBP 110's 6. Knee pain: Likely related to rheumatoid arthritis. Knee Xray with subQ edema. No acute osseous findings. No change 7. Deconditioning - PT recommending SNF.  Length of Stay: 12    Reviewed: Allergy & Precautions, NPO status , Patient's Chart, lab work & pertinent test results  Airway Mallampati: II  TM Distance: >3 FB Neck ROM: Full    Dental no notable dental hx.    Pulmonary neg pulmonary ROS,    breath sounds clear to auscultation + decreased breath sounds      Cardiovascular hypertension, +CHF  Normal cardiovascular exam+ dysrhythmias Atrial Fibrillation  Rhythm:Regular  Rate:Normal     Neuro/Psych negative neurological ROS  negative psych ROS   GI/Hepatic negative GI ROS, Neg liver ROS,   Endo/Other  diabetesMorbid obesity  Renal/GU DialysisRenal diseaseDialyzed this am >3L removed  negative genitourinary   Musculoskeletal negative musculoskeletal ROS (+)   Abdominal (+) + obese,   Peds negative pediatric ROS (+)  Hematology  (+) anemia ,   Anesthesia Other Findings   Reproductive/Obstetrics negative OB ROS                            Anesthesia Physical Anesthesia Plan  ASA: IV  Anesthesia Plan: MAC   Post-op Pain Management:    Induction: Intravenous  PONV Risk Score and Plan: 1 and Ondansetron  Airway Management Planned: Simple Face Mask  Additional Equipment:   Intra-op Plan:   Post-operative Plan:   Informed Consent: I have reviewed the patients History and Physical, chart, labs and discussed the procedure including the risks, benefits and alternatives for the proposed anesthesia with the patient or authorized representative who has indicated his/her understanding and acceptance.   Dental advisory given  Plan Discussed with: CRNA and Surgeon  Anesthesia Plan Comments: (Very tight IVF control Supplement with nitrous as needed A conversion to GETA would probably require post-op ventilation)       Anesthesia Quick Evaluation

## 2017-05-04 NOTE — Plan of Care (Signed)
  Clinical Measurements: Diagnostic test results will improve 05/04/2017 0223 - Progressing by Jacqlyn Larsen, RN   Pt to have insertion of tunneled dialysis catheter 2/19 for continued dialysis treatments. Pt agreeable to plan and plan of care will continue as ordered.

## 2017-05-04 NOTE — Progress Notes (Addendum)
ANTICOAGULATION CONSULT NOTE - Follow Up Consult  Pharmacy Consult for heparin infusion Indication: atrial fibrillation  No Known Allergies  Patient Measurements: Height: 6' (182.9 cm) Weight: (floor to weigh ) IBW/kg (Calculated) : 77.6 Heparin Dosing Weight: 119.5 kg  Vital Signs: Temp: 97.6 F (36.4 C) (02/18 2334) Temp Source: Oral (02/18 2334) BP: 118/77 (02/19 0358) Pulse Rate: 65 (02/19 0358)  Labs: Recent Labs    05/02/17 0750  05/03/17 0838 05/03/17 1300 05/03/17 1818 05/04/17 0540  HGB 9.8*  --  9.8*  --   --  9.7*  HCT 29.4*  --  29.2*  --   --  29.1*  PLT 503*  --  484*  --   --  539*  APTT 76*  --   --   --   --   --   LABPROT  --   --   --   --   --  16.5*  INR  --   --   --   --   --  1.34  HEPARINUNFRC  --    < >  --  0.70 0.45 0.73*  CREATININE 4.72*  --  5.38*  --   --   --    < > = values in this interval not displayed.    Estimated Creatinine Clearance: 27.7 mL/min (A) (by C-G formula based on SCr of 5.38 mg/dL (H)).   Medications: . sodium chloride    . sodium chloride    .  ceFAZolin (ANCEF) IV    . heparin 2,450 Units/hr (05/04/17 0118)    Assessment: 85 yom on Xarelto PTA for Afib. Patient has marked volume overload with increasing Scr and decreasing urine output, despite lasix infusion and metolazone doses. Last dose of Xarelto was on 2/11 at 2154. Given recent Xarelto exposure, will evaluate both aPTT and heparin levels until correlate.   Successful cardioversion 2/15. Heparin level this morning is slightly above goal range, on 2450 units/hr. No infusion issues. No signs/symptoms of bleeding.   Goal of Therapy:  APTT level: 66-102 s Heparin level 0.3-0.7 units/ml Monitor platelets by anticoagulation protocol: Yes   Plan:  Decrease IV heparin to 2350 units/hr. Daily heparin level, CBC, s/s bleeding Planning Eliquis 5 mg BID once procedures completed.  Uvaldo Rising, BCPS  Clinical Pharmacist Pager [phone]  05/04/2017 7:03 AM

## 2017-05-04 NOTE — Transfer of Care (Signed)
Immediate Anesthesia Transfer of Care Note  Patient: John Bailey  Procedure(s) Performed: INSERTION OF TUNNELED  DIALYSIS CATHETER (Right Chest)  Patient Location: PACU  Anesthesia Type:MAC  Level of Consciousness: awake, alert  and oriented  Airway & Oxygen Therapy: Patient Spontanous Breathing  Post-op Assessment: Report given to RN, Post -op Vital signs reviewed and stable and Patient moving all extremities X 4  Post vital signs: Reviewed and stable  Last Vitals:  Vitals:   05/04/17 1521 05/04/17 1524  BP: (!) 152/114 102/66  Pulse: 67 66  Resp:  (!) 59  Temp:    SpO2: 100% 99%    Last Pain:  Vitals:   05/04/17 1110  TempSrc: Oral  PainSc:       Patients Stated Pain Goal: 0 (00/17/49 4496)  Complications: No apparent anesthesia complications

## 2017-05-04 NOTE — Progress Notes (Signed)
PROGRESS NOTE    John Bailey  OBS:[id number] DOB: 03-06-1971 DOA: 04/22/2017 PCP: [name], MD   Brief Narrative:  John Bailey is a 47 y.o.with a PMH of rheumatoid arthritis, diabetes mellitus, heart failure, atrial fibrillation. Patient presented with dyspnea and found to have a CHF exacerbation. Diuresed with worsening kidney function so had temporary dialysis catheter placed for volume removal given that he was +100 lbs. Also had Atrial Fibrillation with RVR.  Now has Fever and new leukocytosis. Infectious workup so far is unremarkable. Discussed case with ID and patient's Rheumatologist and will start patient on po Prednisone.   Patient  taken for a Successful DCCV 04/30/17. Cardiology sending Immunoelectrophoresis and considering technetium scan to exclude Amyloid given Volume status and LVH.   Patient also complaining of severe diarrhea and has not been on Abx and not taking Laxatives so will r/o C Difficile and was not infectious. Likely related to Dialysis.  Patient being transitioned off Amiodarone gtt to po and Cardiology recommending transitioning to po Elqiuis 5 mg po BID when all procedures including vascular access is completed. Vascular surgery consulted and evaluating for Tunneled HD Access and obtained Vein Mapping. Permanent Access obtained today by Vascular Surgery by removing Right Internal Jugular Vein Temporary Dialysis Catheter and placing a RIJ Tunneled Dialysis catheter under U/S guidance.   Will continue to get Daily Dialysis per Nephrology and so far is close to -25 Liters.   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  -Weight down to 360 but not weighed today  -Echocardiogram significant for an EF of 55-60% and severe LVH with dilated left atrium and trivial pericardial effusion; unable to assess diastolic dysfunction -IV Lasix 160 mg bolus q6h to be stopped by Nephrology with poor UOP and worsening Cr -C/w Strict I's/O's, Daily Weights, SLIV  -Patient is -24.198 Liters; Weight is now 306 -Trialysis Cath placed 04/27/16 and will undergo Dialysis again today to pull off some Volume but filtration not that great with temporary Dialysis Catheter -Vascular Surgery consulted for more permanent HD access and obtaining Vein Mapping; Tunneled Catheter to be placed today.  -C/w Dialysis per Nephrology reccomendations  -PT/OT recommending SNF  Anasarca -Secondary to fluid overload. Fluid improving and had 6 Liters removed today  -Management above with Hemodialysis   Rheumatoid Arthritis Flare -Patient on Abatacept every Tuesday.  -Knee x-rays negative for knee effusion -Held Abatacept in setting of possible infection -Bilateral knee x-ray Negative -C/w Methocarbamol q8hprn  -C/w Gabapentin 300 mg po BID  -Discussed with patient's outpatient Rheumatologist Dr. [name] and recommending po Prednisone and tapering; Started 60 mg po Prednisone and has been on it with some improvement. Will start to taper in the next few days as patient is improving and now decreased to 50 mg po Daily   Atrial fibrillation with RVR s/p DCCV Cardioversion and Now in RVR -C/w Heparin gtt for now until All procedures are accomplished. Vascular to still place access -Cardiology transitioned IV Amio to po 400 mg po BID   -Transferred to  SDU given high risk on 2/14 -C/w Carvedilol 18.75 mg po BID  -DCCV done 2/15 and converted to NSR -Recommending po Eliquis after all  procedures are done and stopping Heparin gtt likley tomorrow  -Appreciate Cardiology Recc's   AKI on CKD Stage IV, Worsening  -Worsening and now ESRD -Per Nephro will get daily dialysis; C/w Daily UF and HD -BUN/Cr went from 64/4.04 -> 61/4.38 -> 60/4.40 -> 56/4.72 -> 60/5.38 -> 58/5.58 -IV Lasix being stopped by Nephrology  -VVS for Vascular Access Assessment/Placement and Vein Mapping done yesterday. VVS to place permanent Access today  -Appreciate Nephrology Recommendations as Kidney Fxn is worsening even on dialysis (Temporary Dialysis Cathter not filtering properly per my discussion with Dr. Joelyn Oms and once he has Tunneled Permanent access will be able to filter better).   Hyperkalemia, improved -> Now Hypokalemic  -In the setting of worsening Kidney Disease. -K+ now 3.5 -Continue to monitor daily CMP's  SIRS suspect RA Flare vs Gouty Flare or Combination,  -Unknown etiology but likely combination of RA Flare and Gout Flare   -Some Tachycardia in setting of atrial fibrillation but probably worse from Pain -Otherwise, hemodynamically stable.  -Infectious workup so far has been negative. Patient with history of RA and likley this could be a flair.  -Kidney function worsening and would not start NSAIDs.  -Discussed with patient about starting steroids and will start Prednisone 60 mg po Daily and Taper; Will start 50 mg po Tomorrow  -Repeat Urinalysis and obtain urine culture in setting of AKI pending  -Blood Cx x2 show NGTD at 5 days  -Patient aFebrile again today  -ESR was 110 and CRP was 34.1 -Checked Uric Acid Level and was 14.2 -C/w Colchicine per Nephrology  -WBC went from 8.8 -> 21.6 -> 22.8 -> 21.9 -> 24.9 -> 27.6 -> 33.3 (feel worsening now due to po Prednisone but hae WBC's in the 20K range before starting)  -Discussed with Infectious Diseases Dr. Tommy Medal who feels Fever and WBC is related to RA -Discussed with patient's Rheumatologist Dr. [name] who recommended  starting po Prednisone and continuing to hold Abatacept   Diabetes Mellitus  -C/w Lantus 25 units sq qHS and with Moderate Novolog SSI AC/HS -CBG's ranging from 144-162 and Continue to Monitor   Hyperphosphatemia -Patient's Phos Level was 6.8 -Expect to Correct further in Dialysis but per my discussion with Dr. Joelyn Oms, the temporary dialysis catheter was not very good for UF but once he has permanent access should improve  -Repeat Phos in AM   HTN with Severe LVH -BP stable -Cardiology Dr. Caryl Comes checking to r/o Amyloid given degree of LVH -Immunoelectrophoresis to check for Amyloid however unable to collect 24 hour urine due to worsening Renal Function  -Follow Electrophoresis   Diarrhea -Patient not on Abx or Stool softeners -Having Liquidy Diarrhea and multiple Episodes and given elevated WBC (prior to Steroid initiation and Fevers) will r/o Infectious Diarrhea  -Checked C Difficile and was Negative  -GI Pathogen Panel Negative -? From Dialysis -May try Lomotil or Immodium if continuing to worsen   DVT prophylaxis: Anticoagulated with Heparin gtt Code Status: FULL CODE Family Communication: No family present at bedside Disposition Plan: SNF when medically stable to D/C   Consultants:   Cardiology Heart Failure Team  Nephrology  Interventional Radiology  Discussed Case with ID Dr. Tommy Medal  Discussed Case with Patient's Primary Rheumatologist Dr. [name]   Vascular Surgery    Procedures:  ECHOCARDIOGRAM 04/25/17 ------------------------------------------------------------------- Study Conclusions  - Left  ventricle: The cavity size was normal. Wall thickness was   increased in a pattern of severe LVH. Systolic function was   normal. The estimated ejection fraction was in the range of 55%   to 60%. The study is not technically sufficient to allow   evaluation of LV diastolic function. - Mitral valve: There was mild regurgitation. - Left atrium: The atrium was  moderately dilated. - Pericardium, extracardiac: A trivial pericardial effusion was   identified posterior to the heart.  VEIN MAPPING  Right  Upper Extremity Vein Map  Cephalic  Segment Diameter Depth Comment  1. Axilla 3.81m 16.3423m  2. Mid upper arm mm mm bandages  3. Above AC 2.23m17m.6mm87m4. In AC [facility] 46mmm  6m Below AC 3.8mm 9.1m   659mrox  forearm 3.1mm 10.039mbranc67m7. Mid forearm 2.9mm 9.23mm 65mdage459m8 distal forearm 3.2mm 2.8mm   29mrist62m9mm 2.7mm    m73mm   47msilic  Segment Diameter Depth Comment  1. Axilla mm mm NV  2. Mid upper arm mm mm NV  3. Above AC mm mm NV  4. In AC 2.1mm mm   5. Belo223mC 1.5mm mm   6. Mid f559marm 1.6mm mm   7. Wrist 91mmm mm    mm mm    623mmm    mm mm     Left  Upper Extremity Vein Map  Cephalic  Segment Diameter Depth Comment  1. Axilla 3.1mm 20.59mm   2. Prox25ml  up56m 3.6 mm 13.5 mm   2. Mid upper arm 2.9mm 10.59mm   3. Above 91m2.7mm36m.1mm   4. In AC 3.68m 3.59m48m 5. Below [facility] 2.32m 8.5223mbranch  6. Mid for72mm 2.29m 4.9mm   7. Wrist 3.5mm 4.169m   m26mm    mm mm    m223mm   659msilic  Segment Diameter Depth Comment  1. Axilla mm mm nv  2. Mid upper arm mm mm nv  3. Above AC mm mm nv  4. In AC 3.23mm mm   5. Below AC 4.59mm mm   6.23md forearm 1.23mm mm   46mWrist mm mm             13m   Preliminary results by tech - Upper Ext. Arterial Duplex Completed. Right arm - patent brachial, radial and ulnar arteries without evidence of stenosis. There is a high bifurcation of the radial and ulnar arteries in the upper arm. Left arm - patent brachial, radial and ulnar arteries without evidence of stenosis  PROCEDURE by Dr. Chen 05/04/17  1.  Right internal juguBridgett Larssonvein tunneled dialysis catheter placement 2.  Right internal jugular vein cannulation under ultrasound guidance 3.  Removal of right internal jugular vein temporary dialysis  catheter  Antimicrobials:  Anti-infectives (From admission, onward)   Start     Dose/Rate Route Frequency Ordered Stop   05/04/17 1215  ceFAZolin (ANCEF) 3 g in dextrose 5 % 50 mL IVPB    Comments:  Send with pt to OR   3 g 130 mL/hr over 30 Minutes Intravenous To Surgery 05/04/17 1202 05/04/17 1450   05/04/17 0600  ceFAZolin (ANCEF) IVPB 1 g/50 mL premix  Status:  Discontinued    Comments:  Send with pt to OR   1 g 100 mL/hr over 30 Minutes Intravenous To Surgery 05/03/17 1606 05/04/17 1202     Subjective:  Seen and examined in dialysis again today and had no complaints and felt better. No Abdominal Tenderness today. Thinks diarrhea is induced by dialysis. No CP or SOB.   Objective: Vitals:   05/04/17 1100 05/04/17 1110 05/04/17 1521 05/04/17 1524  BP: 122/80 122/79 (!) 152/114 102/66  Pulse: 65 65 67 66  Resp: 18 18  (!) 59  Temp:  97.9 F (36.6 C) 97.7 F (36.5 C)   TempSrc:  Oral    SpO2: 98% 99% 100% 99%  Weight:  (!) 139 kg (306 lb 7 oz)    Height:        Intake/Output Summary (Last 24 hours) at 05/04/2017 1534 Last data filed at 05/04/2017 1513 Gross per 24 hour  Intake 350 ml  Output 6010 ml  Net -5660 ml   Filed Weights   05/04/17 0700 05/04/17 1110  Weight: (!) 145 kg (319 lb 10.7 oz) (!) 139 kg (306 lb 7 oz)   Examination: Physical Exam:  Constitutional: WN/WD Morbidly obese AAM who is still swollen. Watching TV while being dialyzed  Eyes: Sclerae anicteric; Lids normak ENMT: External Ears and nose appear normal. Grossly normal hearing Neck: Getting dialysis from Neck Catheter Respiratory: Diminished to auscultation but unlabored breathing. Wearing supplemental O2 via [place] Cardiovascular: RRR; 2-3+ LE with anasarca  Abdomen: Slightly firm, NT, Distended due to body habitus. Bowel sounds present GU: Deferred Musculoskeletal: Right Leg bigger than left. No contractures Skin: Warm and Dry; LE bandaged bilaterally Neurologic: CN 2-12 grossly  intact. Psychiatric: Normal mood and affect. Intact judgement and insight  Data Reviewed: I have personally reviewed following labs and imaging studies  CBC: Recent Labs  Lab 04/30/17 0458 04/30/17 2044 05/01/17 0528 05/02/17 0750 05/03/17 0838 05/04/17 0540  WBC 21.9* 24.6* 24.9* 27.0* 27.6* 33.3*  NEUTROABS 20.1*  --  21.0* 22.1* 25.1* 30.6*  HGB 9.7* 9.8* 9.5* 9.8* 9.8* 9.7*  HCT 29.1* 29.1* 28.2* 29.4* 29.2* 29.1*  MCV 83.9 83.1 82.7 83.3 83.4 83.4  PLT 361 414* 466* 503* 484* 510*   Basic Metabolic Panel: Recent Labs  Lab 04/30/17 0458 05/01/17 0528 05/02/17 0750 05/03/17 0838 05/04/17 0540  NA 130* 131*  131* 131* 129* 131*  K 4.1 3.6  3.6 3.5 3.1* 3.5  CL 94* 94*  94* 95* 94* 94*  CO2 _0 21* 21*  GLUCOSE 166* 177*  183* 139* 207* 217*  BUN 61* 60*  61* 56* 60* 58*  CREATININE 4.38* 4.40*  4.40* 4.72* 5.38* 5.58*  CALCIUM 8.4* 8.6*  8.6* 8.3* 8.3* 8.5*  MG 2.2 2.3 2.3 2.3 2.3  PHOS 7.2* 7.1*  7.1* 5.8* 6.5* 6.9*   GFR: Estimated Creatinine Clearance: 24.4 mL/min (A) (by C-G formula based on SCr of 5.58 mg/dL (H)). Liver Function Tests: Recent Labs  Lab 04/30/17 0458 05/01/17 0528 05/02/17 0750 05/03/17 0838 05/04/17 0540  AST 14* 10* 11* 10* 12*  ALT 8* 7* <5* 7* 8*  ALKPHOS 81 80 83 80 70  BILITOT 0.7 0.8 0.6 0.5 0.6  PROT 5.8* 6.2* 6.2* 5.7* 6.1*  ALBUMIN 1.9* 2.0*  2.0* 2.2* 2.3* 2.3*   No results for input(s): LIPASE, AMYLASE in the last 168 hours. No results for input(s): AMMONIA in the last 168 hours. Coagulation Profile: Recent Labs  Lab 05/04/17 0540  INR 1.34   Cardiac Enzymes: No results for input(s): CKTOTAL, CKMB, CKMBINDEX, TROPONINI in the last 168 hours. BNP (last 3 results) No results for input(s): PROBNP in the last 8760 hours.  HbA1C: No results for input(s): HGBA1C in the last 72 hours. CBG: Recent Labs  Lab 05/03/17 1410 05/03/17 1654 05/03/17 2107 05/04/17 1127 05/04/17 1522  GLUCAP 131* 139*  144* 170* 162*   Lipid Profile: No results for input(s): CHOL, HDL, LDLCALC, TRIG, CHOLHDL, LDLDIRECT in the last 72 hours. Thyroid Function Tests: No results for input(s): TSH, T4TOTAL, FREET4, T3FREE, THYROIDAB in the last 72 hours. Anemia Panel: No results for input(s): VITAMINB12, FOLATE, FERRITIN, TIBC, IRON, RETICCTPCT in the last 72 hours. Sepsis Labs: No results for input(s): PROCALCITON, LATICACIDVEN in the last 168 hours.  Recent Results (from the past 240 hour(s))  Respiratory Panel by PCR     Status: None   Collection Time: 04/25/17 10:50 AM  Result Value Ref Range Status   Adenovirus NOT DETECTED NOT DETECTED Final   Coronavirus 229E NOT DETECTED NOT DETECTED Final   Coronavirus HKU1 NOT DETECTED NOT DETECTED Final   Coronavirus NL63 NOT DETECTED NOT DETECTED Final   Coronavirus OC43 NOT DETECTED NOT DETECTED Final   Metapneumovirus NOT DETECTED NOT DETECTED Final   Rhinovirus / Enterovirus NOT DETECTED NOT DETECTED Final   Influenza A NOT DETECTED NOT DETECTED Final   Influenza A H1 NOT DETECTED NOT DETECTED Final   Influenza A H1 2009 NOT DETECTED NOT DETECTED Final   Influenza A H3 NOT DETECTED NOT DETECTED Final   Influenza B NOT DETECTED NOT DETECTED Final   Parainfluenza Virus 1 NOT DETECTED NOT DETECTED Final   Parainfluenza Virus 2 NOT DETECTED NOT DETECTED Final   Parainfluenza Virus 3 NOT DETECTED NOT DETECTED Final   Parainfluenza Virus 4 NOT DETECTED NOT DETECTED Final   Respiratory Syncytial Virus NOT DETECTED NOT DETECTED Final   Bordetella pertussis NOT DETECTED NOT DETECTED Final   Chlamydophila pneumoniae NOT DETECTED NOT DETECTED Final   Mycoplasma pneumoniae NOT DETECTED NOT DETECTED Final    Comment: Performed at [facility], [address], [place], [place] [address]  Culture, blood (routine x 2)     Status: None   Collection Time: 04/26/17 12:10 AM  Result Value Ref Range Status   Specimen Description BLOOD LEFT HAND  Final   Special  Requests IN PEDIATRIC BOTTLE Blood Culture adequate volume  Final   Culture   Final    NO GROWTH 5 DAYS Performed at [facility], [place] [address] [place], [place] [address]    Report Status 05/01/2017 FINAL  Final  Culture, blood (routine x 2)     Status: None   Collection Time: 04/26/17 12:18 AM  Result Value Ref Range Status   Specimen Description BLOOD LEFT HAND  Final   Special Requests IN PEDIATRIC BOTTLE Blood Culture adequate volume  Final   Culture   Final    NO GROWTH 5 DAYS Performed at [facility], [place] [address] [place], [place] [address]    Report Status 05/01/2017 FINAL  Final  Gastrointestinal Panel by PCR , Stool     Status: None   Collection Time: 05/02/17  2:42 PM  Result Value Ref Range Status   Campylobacter species NOT DETECTED NOT DETECTED Final   Plesimonas shigelloides NOT DETECTED NOT DETECTED Final   Salmonella species NOT DETECTED NOT DETECTED Final   Yersinia enterocolitica NOT DETECTED NOT DETECTED Final   Vibrio species NOT DETECTED NOT DETECTED Final   Vibrio cholerae NOT DETECTED NOT DETECTED Final   Enteroaggregative E coli (EAEC) NOT DETECTED NOT DETECTED Final   Enteropathogenic E coli (EPEC)  NOT DETECTED NOT DETECTED Final   Enterotoxigenic E coli (ETEC) NOT DETECTED NOT DETECTED Final   Shiga like toxin producing E coli (STEC) NOT DETECTED NOT DETECTED Final   Shigella/Enteroinvasive E coli (EIEC) NOT DETECTED NOT DETECTED Final   Cryptosporidium NOT DETECTED NOT DETECTED Final   Cyclospora cayetanensis NOT DETECTED NOT DETECTED Final   Entamoeba histolytica NOT DETECTED NOT DETECTED Final   Giardia lamblia NOT DETECTED NOT DETECTED Final   Adenovirus F40/41 NOT DETECTED NOT DETECTED Final   Astrovirus NOT DETECTED NOT DETECTED Final   Norovirus GI/GII NOT DETECTED NOT DETECTED Final   Rotavirus A NOT DETECTED NOT DETECTED Final   Sapovirus (I, II, IV, and V) NOT DETECTED NOT DETECTED Final    Comment: Performed at [facility], [address] [place], [place] [address]  C difficile quick scan w PCR reflex     Status: None   Collection Time: 05/02/17  2:42 PM  Result Value Ref Range Status   C Diff antigen NEGATIVE NEGATIVE Final   C Diff toxin NEGATIVE NEGATIVE Final   C Diff interpretation No C. difficile detected.  Final    Comment: Performed at [facility], [place] [address] [place], [place] [address]  Surgical pcr screen     Status: None   Collection Time: 05/04/17  6:53 AM  Result Value Ref Range Status   MRSA, PCR NEGATIVE NEGATIVE Final   Staphylococcus aureus NEGATIVE NEGATIVE Final    Comment: (NOTE) The Xpert SA Assay (FDA approved for NASAL specimens in patients 89 years of age and older), is one component of a comprehensive surveillance program. It is not intended to diagnose infection nor to guide or monitor treatment. Performed at [facility], [place] [address], [place], [place] [address]     Radiology Studies: Vas [place] Upper Extremity Arterial Duplex  Result Date: 05/03/2017 UPPER EXTREMITY DUPLEX STUDY Indications: Pre-op for AVF. Examination Guidelines: A complete evaluation includes B-mode imaging, spectral doppler, color doppler, and power doppler as needed of all accessible portions of each vessel. Bilateral testing is considered an integral part of a complete examination. Limited examinations for reoccurring indications may be performed as noted.  Right Pre-Dialysis Findings: +-----------------+----------+------------------+---------+--------------------+ Location         PSV (cm/s)Intralum. Diam.   Waveform Comments                                        (mm)                                            +-----------------+----------+------------------+---------+--------------------+ Brachial Antecub.50                          triphasicproximal upper arm   fossa                                                                       +-----------------+----------+------------------+---------+--------------------+ Radial Art at    51        2.60  biphasic high bifurcation in  Wrist                                                 the upper arm        +-----------------+----------+------------------+---------+--------------------+ Ulnar Art at     44        3.00              biphasic high bifurcation in  Wrist                                                 the upper arm        +-----------------+----------+------------------+---------+--------------------+ Left Pre-Dialysis Findings: +-----------------------+----------+--------------------+---------+--------+ Location               PSV (cm/s)Intralum. Diam. (mm)Waveform Comments +-----------------------+----------+--------------------+---------+--------+ Brachial Antecub. fossa47        6.20                triphasic         +-----------------------+----------+--------------------+---------+--------+ Radial Art at Wrist    67        3.20                                  +-----------------------+----------+--------------------+---------+--------+ Ulnar Art at Wrist     73        4.70                triphasic         +-----------------------+----------+--------------------+---------+--------+ Final Interpretation:  Right: No obstruction visualized in the right upper extremity. Left: No obstruction visualized in the left upper extremity. *See table(s) above for measurements and observations. Electronically signed by Curt Jews on 05/03/2017 at 7:11:24 PM.  ** Final **  Vas [place] Upper Ext Vein Mapping (pre-op Avf)  Result Date: 05/03/2017 UPPER EXTREMITY VEIN MAPPING History: Pre-access. Examination Guidelines: A complete evaluation includes B-mode imaging, spectral doppler, color doppler, and power doppler as needed of all accessible portions of each vessel. Bilateral testing is considered an integral part of a complete examination. Limited  examinations for reoccurring indications may be performed as noted. +-----------------+-------------+----------+---------+ Right Cephalic   Diameter (mm)Depth (mm)Findings  +-----------------+-------------+----------+---------+ Shoulder             3.80        6.30             +-----------------+-------------+----------+---------+ Prox upper arm       3.10       11.80             +-----------------+-------------+----------+---------+ Mid upper arm                           bandages  +-----------------+-------------+----------+---------+ Dist upper arm       2.40        4.60             +-----------------+-------------+----------+---------+ Antecubital fossa    1.70        9.40             +-----------------+-------------+----------+---------+ Prox forearm         3.80  9.60             +-----------------+-------------+----------+---------+ Mid forearm          2.90        9.40   branching +-----------------+-------------+----------+---------+ Dist forearm         3.20        2.80             +-----------------+-------------+----------+---------+ Wrist                1.90        2.70             +-----------------+-------------+----------+---------+ +-----------------+-------------+----------+--------------+ Right Basilic    Diameter (mm)Depth (mm)   Findings    +-----------------+-------------+----------+--------------+ Shoulder                                not visualized +-----------------+-------------+----------+--------------+ Prox upper arm                          not visualized +-----------------+-------------+----------+--------------+ Mid upper arm                           not visualized +-----------------+-------------+----------+--------------+ Dist upper arm                          not visualized +-----------------+-------------+----------+--------------+ Antecubital fossa                       not visualized  +-----------------+-------------+----------+--------------+ Prox forearm         2.10                              +-----------------+-------------+----------+--------------+ Mid forearm          1.50                              +-----------------+-------------+----------+--------------+ Distal forearm       1.60                              +-----------------+-------------+----------+--------------+ Wrist                1.40                              +-----------------+-------------+----------+--------------+ +-----------------+-------------+----------+---------+ Left Cephalic    Diameter (mm)Depth (mm)Findings  +-----------------+-------------+----------+---------+ Shoulder             3.10       20.00             +-----------------+-------------+----------+---------+ Prox upper arm       3.60       13.50             +-----------------+-------------+----------+---------+ Mid upper arm        2.90       10.00             +-----------------+-------------+----------+---------+ Dist upper arm       2.70       10.00             +-----------------+-------------+----------+---------+ Antecubital fossa    3.70        3.00             +-----------------+-------------+----------+---------+  Prox forearm         2.80        8.50   branching +-----------------+-------------+----------+---------+ Mid forearm          2.90        4.90             +-----------------+-------------+----------+---------+ Dist forearm         2.80        4.20             +-----------------+-------------+----------+---------+ Wrist                3.50        4.10             +-----------------+-------------+----------+---------+ +-----------------+-------------+----------+--------------+ Left Basilic     Diameter (mm)Depth (mm)   Findings    +-----------------+-------------+----------+--------------+ Shoulder                                not visualized  +-----------------+-------------+----------+--------------+ Prox upper arm                          not visualized +-----------------+-------------+----------+--------------+ Mid upper arm                           not visualized +-----------------+-------------+----------+--------------+ Dist upper arm                          not visualized +-----------------+-------------+----------+--------------+ Antecubital fossa    3.40                              +-----------------+-------------+----------+--------------+ Prox forearm         4.00                              +-----------------+-------------+----------+--------------+ Mid forearm          1.40                              +-----------------+-------------+----------+--------------+ Distal forearm                          not visualized +-----------------+-------------+----------+--------------+ Elbow                                   not visualized +-----------------+-------------+----------+--------------+ Wrist                                   not visualized +-----------------+-------------+----------+--------------+ Final Interpretation: *See table(s) above for measurements and observations.  Curt Jews Electronically signed by Curt Jews on 05/03/2017 at 7:10:36 PM.  ** Final **  Scheduled Meds: . [MAR Hold] amiodarone  400 mg Oral BID  . [MAR Hold] carvedilol  18.75 mg Oral BID WC  . [MAR Hold] colchicine  0.3 mg Oral Daily  . [MAR Hold] fluticasone  2 spray Each Nare Daily  . [MAR Hold] gabapentin  300 mg Oral Daily  . [MAR Hold] insulin aspart  0-15 Units Subcutaneous TID WC  . [MAR Hold] insulin aspart  0-5 Units Subcutaneous  QHS  . [MAR Hold] insulin glargine  25 Units Subcutaneous QHS  . [MAR Hold] predniSONE  50 mg Oral Q breakfast  . [MAR Hold] sodium chloride flush  3 mL Intravenous Q12H  . [MAR Hold] sodium chloride flush  3 mL Intravenous Q12H   Continuous Infusions: . [MAR Hold]  sodium chloride    . sodium chloride    . sodium chloride 10 mL/hr at 05/04/17 1220  . heparin Stopped (05/04/17 1200)    LOS: 12 days   Kerney Elbe, DO [facility] Pager [phone]  If 7PM-7AM, please contact night-coverage [email or web address] Password [facility] 05/04/2017, 3:34 PM

## 2017-05-04 NOTE — Progress Notes (Signed)
ANTICOAGULATION CONSULT NOTE - Follow Up Consult  Pharmacy Consult for heparin infusion Indication: atrial fibrillation  No Known Allergies  Patient Measurements: Height: 6' (182.9 cm) Weight: (!) 306 lb 7 oz (139 kg) IBW/kg (Calculated) : 77.6 Heparin Dosing Weight: 119.5 kg  Vital Signs: Temp: 97.6 F (36.4 C) (02/19 1650) Temp Source: Oral (02/19 1110) BP: 101/69 (02/19 1752) Pulse Rate: 69 (02/19 1752)  Labs: Recent Labs    05/02/17 0750  05/03/17 0838 05/03/17 1300 05/03/17 1818 05/04/17 0540  HGB 9.8*  --  9.8*  --   --  9.7*  HCT 29.4*  --  29.2*  --   --  29.1*  PLT 503*  --  484*  --   --  539*  APTT 76*  --   --   --   --   --   LABPROT  --   --   --   --   --  16.5*  INR  --   --   --   --   --  1.34  HEPARINUNFRC  --    < >  --  0.70 0.45 0.73*  CREATININE 4.72*  --  5.38*  --   --  5.58*   < > = values in this interval not displayed.    Estimated Creatinine Clearance: 24.4 mL/min (A) (by C-G formula based on SCr of 5.58 mg/dL (H)).   Medications: . sodium chloride    . sodium chloride    . sodium chloride 10 mL/hr at 05/04/17 1220  . heparin Stopped (05/04/17 1200)    Assessment: 61 yom on Xarelto PTA for Afib. Patient has marked volume overload with increasing Scr and decreasing urine output, despite lasix infusion and metolazone doses. Last dose of Xarelto was on 2/11 at 2154. Given recent Xarelto exposure, will evaluate both aPTT and heparin levels until correlate.   Successful cardioversion 2/15. Heparin level this morning is slightly above goal range, on 2450 units/hr. No infusion issues. No signs/symptoms of bleeding.  Heparin drip off for tunneled HD cath placement in IR> spoke to MD tonight> of to restart heparin 1hr after procedure.    Goal of Therapy:  APTT level: 66-102 s Heparin level 0.3-0.7 units/ml Monitor platelets by anticoagulation protocol: Yes   Plan:  Restart IV heparin  2350 units/hr. Daily heparin level, CBC, s/s  bleeding Planning Eliquis 5 mg BID once procedures completed.  Bonnita Nasuti Pharm.D. CPP, BCPS Clinical Pharmacist [phone] 05/04/2017 6:26 PM

## 2017-05-04 NOTE — Progress Notes (Signed)
PT Cancellation Note  Patient Details Name: CRISTAL HOWATT MRN: [id number] DOB: 09/13/1970   Cancelled Treatment:    Reason Eval/Treat Not Completed: Patient at procedure or test/unavailable.  Since early a.m, pt has been in HD then straight to OR for a tunnel catheter placement.  We'll see tomorrow as able. 05/04/2017  Donnella Sham, PT [phone]  (pager)   Tessie Fass Ajani Schnieders 05/04/2017, 5:17 PM

## 2017-05-04 NOTE — Op Note (Signed)
OPERATIVE NOTE  PROCEDURE: 1.  Right internal jugular vein tunneled dialysis catheter placement 2.  Right internal jugular vein cannulation under ultrasound guidance 3.  Removal of right internal jugular vein temporary dialysis catheter  PRE-OPERATIVE DIAGNOSIS: end-stage renal failure  POST-OPERATIVE DIAGNOSIS: same as above  SURGEON: Adele Barthel, MD  ANESTHESIA: local and MAC  ESTIMATED BLOOD LOSS: 30 cc  FINDING(S): 1.  High position of temporary dialysis catheter 2.  Tips of the catheter in the right atrium on fluoroscopy 3.  No obvious pneumothorax on fluoroscopy  SPECIMEN(S):  none  INDICATIONS:   John Bailey is a 47 y.o. male who presents with hemodialysis dependence.  The patient presents for tunneled dialysis catheter placement.  The patient is aware the risks of tunneled dialysis catheter placement include but are not limited to: bleeding, infection, central venous injury, pneumothorax, possible venous stenosis, possible malpositioning in the venous system, and possible infections related to long-term catheter presence.  The patient was aware of these risks and agreed to proceed.   DESCRIPTION: After written full informed consent was obtained from the patient, the patient was taken back to the operating room.  Prior to induction, the patient was given IV antibiotics.  After obtaining adequate sedation, the patient was prepped and draped in the standard fashion for a chest or neck tunneled dialysis catheter placement.     Based on my exam, I felt the prior placed temporary dialysis catheter was too high to convert to a tunneled dialysis catheter.  I cut the securing stitches on the temporary dialysis catheter and removed the catheter will compressing the right internal jugular vein.  The cannulation site, the catheter exit site, and tract for the subcutaneous tunnel were then anesthestized with a total of 20 cc of 1% lidocaine without epinephrine.  Under ultrasound  guidance, the right internal jugular vein was cannulated with the 18 gauge needle.  A J-wire was then placed down into the inferior vena cava under fluoroscopic guidance.  The wire was then secured in place with a clamp to the drapes.  The tapered wire guide was left occluding the lumen of the needle to avoid air embolism.    I then made stab incisions at the neck and exit sites.   I dissected from the exit site to the cannulation site with a tunneler.   The wire was then unclamped and I removed the needle.  The skin tract and venotomy was dilated serially with graduated dilators, passed under fluoroscopic guidance.   Finally, the dilator-sheath was placed under fluoroscopic guidance into the superior vena cava.  The central dilator and wire were removed.  A 23 cm Palindrome catheter was placed under fluoroscopic guidance down into the right atrium.    The sheath was broken and peeled away while holding the catheter cuff at the level of the skin.  The catheter was clamped with the plastic clamp and the back end of this catheter was transected.  One lumen was docked onto the tunneler.  The catheter was delivered through the subcutaneous tunnel by pulling the tunneler out the exit site.  The catheter was reclamped and was transected a second time, revealing the two lumens of this catheter.  The catheter collar was loaded over the back end of the catheter.  The two  ports were docked onto these two lumens.  The catheter collar was then snapped into place, securing the two ports.  Each port was tested by aspirating and flushing.  No resistance was  noted.  Each port was then thoroughly flushed with heparinized saline.  The catheter was secured in placed with two interrupted stitches of 3-0 Nylon tied to the catheter.  The neck incision was closed with a U-stitch of 4-0 Monocryl.  The neck and chest incision were cleaned.  The closed neck incision was reinforced with Dermabond and sterile bandages applied to the  catheter exit site.  Each port was then loaded with concentrated heparin (1000 Units/mL) at the manufacturer recommended volumes to each port.  Sterile caps were applied to each port.    On completion fluoroscopy, the tips of the catheter were in the right atrium, and there was no evidence of pneumothorax.   COMPLICATIONS: none  CONDITION: stable   Adele Barthel, MD, [facility] [place] Office: [phone] Pager: [phone]  05/04/2017, 3:08 PM

## 2017-05-04 NOTE — Progress Notes (Signed)
Notified Dr. Kalman Shan that patient has not had Coreg this AM.  Received verbal order to hold Coreg prior to OR.

## 2017-05-04 NOTE — Progress Notes (Addendum)
OT Cancellation Note  Patient Details Name: John Bailey MRN: [id number] DOB: 06-24-1970   Cancelled Treatment:    Reason Eval/Treat Not Completed: Patient at procedure or test/ unavailable. Pt off unit for HD. Will check back as able to initiate OT evaluation.   16:27 Multiple attempts to see pt today and remains off unit after tunneled HD catheter placement. Will attempt again tomorrow.   Norman Herrlich, MS OTR/L  Pager: [place] [name] 05/04/2017, 8:20 AM

## 2017-05-04 NOTE — Progress Notes (Signed)
Patient ID: John Bailey, male   DOB: 07/08/1970, 47 y.o.   MRN: [id number] P    Advanced Heart Failure Rounding Note  PCP-Cardiologist: Pixie Casino, MD  HF Cardiologist: [name], MD  Subjective:    DCCV on 2/15, he remains in NSR today. Now on PO amio.  HD yesterday with 3.8 liters off. No weight recorded this am.   Vein mapping completed yesterday. Still on heparin drip for possible HD access.  Echo (04/25/17): EF 55-60%, severe LVH.   In HD again this morning. Had some epigastric pain yesterday that was relieved with repositioning. No SOB. No dizziness. Feeling frustrated with all the medical problems he has.   Objective:   Weight Range: (!) 360 lb (163.3 kg) Body mass index is 48.82 kg/m.   Vital Signs:   Temp:  [97.3 F (36.3 C)-97.6 F (36.4 C)] 97.6 F (36.4 C) (02/18 2334) Pulse Rate:  [54-65] 65 (02/19 0358) Resp:  [11-22] 22 (02/19 0358) BP: (78-125)/(46-88) 118/77 (02/19 0358) SpO2:  [98 %-100 %] 99 % (02/19 0358) Last BM Date: 05/03/17  Weight change: Filed Weights   Intake/Output:   Intake/Output Summary (Last 24 hours) at 05/04/2017 0713 Last data filed at 05/04/2017 0556 Gross per 24 hour  Intake 610 ml  Output 3783 ml  Net -3173 ml     Physical Exam  General: obese, No resp difficulty. HEENT: normal Neck: Supple. JVP seems elevated, but difficult to see with body habitus. Carotids 2+ bilat; no bruits. No thyromegaly or nodule noted. Cor: PMI nondisplaced. RRR, No M/G/R noted.  Lungs: clear, diminished throughout, normal effort. Abdomen: obese, non-tender, +distension, no HSM. No bruits or masses. + tinkling bowel sounds Extremities: No cyanosis, clubbing, or rash. R and LLE +2 edema to just above knees. RUE mepilex Neuro: Alert & orientedx3, cranial nerves grossly intact. moves all 4 extremities w/o difficulty. Affect pleasant   Telemetry   NSR 60s with 1st degree AV block and IVCD. Personally reviewed.   Labs    CBC Recent  Labs    05/03/17 0838 05/04/17 0540  WBC 27.6* 33.3*  NEUTROABS 25.1* PENDING  HGB 9.8* 9.7*  HCT 29.2* 29.1*  MCV 83.4 83.4  PLT 484* 322*   Basic Metabolic Panel Recent Labs    05/03/17 0838 05/04/17 0540  NA 129* 131*  K 3.1* 3.5  CL 94* 94*  CO2 21* 21*  GLUCOSE 207* 217*  BUN 60* 58*  CREATININE 5.38* 5.58*  CALCIUM 8.3* 8.5*  MG 2.3 2.3  PHOS 6.5* 6.9*   Liver Function Tests Recent Labs    05/03/17 0838 05/04/17 0540  AST 10* 12*  ALT 7* 8*  ALKPHOS 80 70  BILITOT 0.5 0.6  PROT 5.7* 6.1*  ALBUMIN 2.3* 2.3*   No results for input(s): LIPASE, AMYLASE in the last 72 hours. Cardiac Enzymes No results for input(s): CKTOTAL, CKMB, CKMBINDEX, TROPONINI in the last 72 hours.  BNP: BNP (last 3 results) Recent Labs    03/06/17 1800 04/22/17 1734 04/23/17 0736  BNP 791.5* 531.3* 627.9*    ProBNP (last 3 results) No results for input(s): PROBNP in the last 8760 hours.   D-Dimer No results for input(s): DDIMER in the last 72 hours. Hemoglobin A1C No results for input(s): HGBA1C in the last 72 hours. Fasting Lipid Panel No results for input(s): CHOL, HDL, LDLCALC, TRIG, CHOLHDL, LDLDIRECT in the last 72 hours. Thyroid Function Tests No results for input(s): TSH, T4TOTAL, T3FREE, THYROIDAB in the last 72 hours.  Invalid input(s): FREET3  Other results:   Imaging       Medications:     Scheduled Medications: . amiodarone  400 mg Oral BID  . carvedilol  18.75 mg Oral BID WC  . colchicine  0.3 mg Oral Daily  . fluticasone  2 spray Each Nare Daily  . gabapentin  300 mg Oral BID  . insulin aspart  0-15 Units Subcutaneous TID WC  . insulin aspart  0-5 Units Subcutaneous QHS  . insulin glargine  25 Units Subcutaneous QHS  . predniSONE  60 mg Oral Q breakfast  . sodium chloride flush  3 mL Intravenous Q12H  . sodium chloride flush  3 mL Intravenous Q12H    Infusions: . sodium chloride    . sodium chloride    .  ceFAZolin (ANCEF) IV    .  heparin      PRN Medications: sodium chloride, acetaminophen, lidocaine (PF), methocarbamol, ondansetron (ZOFRAN) IV, sodium chloride flush, sodium chloride flush, sodium chloride flush    Assessment/Plan   1. Atrial fibrillation with RVR: On review of prior ECGs, last time he was documented to be in NSR was in 1/18. I suspect that he may be better compensated in NSR. His HR has been difficult to control.  He remains in NSR after DCCV on 2/15.   - Continue PO amio 400 mg BID - Continue Coreg 18.75 mg BID - He is on heparin gtt.  When procedures are completed (vascular access),  transition to Eliquis 5 mg bid based on most recent afib guidelines. 2. Acute on chronic diastolic CHF: Echo this admission with severe LVH, EF 55-60%. Marked volume overload with anasarca. Fluid now coming off with HD but he has a long way to go.  - Continue HD per renal.  No change. - With low voltage and severe LVH, would send UPEP/SPEP, consider nuclear scan for TTR in future if negative SPEP/UPEP. Pending. 3. AKI on CKD Stage IV: I suspect that he will be dialysis dependent for volume management. No change. 4. Fever: Afebrile today though WBCs still high.  Cultures negative.  Suspicion for RA flare, high ESR and CRP.  He has been started on prednisone for management.  Knee pain improved.  Afebrile. WBC trending up > 33 this am. 5. HTN: Improved with volume removal. SBP 110's 6. Knee pain: Likely related to rheumatoid arthritis. Knee Xray with subQ edema. No acute osseous findings. No change 7. Deconditioning - PT recommending SNF.  Length of Stay: [place], NP  05/04/2017, 7:13 AM  Advanced Heart Failure Team Pager [phone] (M-F; [place])  Please contact [facility] for night-coverage after hours (4p -7a ) and weekends on [email or web address]  Patient seen with NP, agree with the above note.  Weight coming down with HD, needs ongoing fluid removal as he is still volume overloaded by exam.  He remains in  NSR.   - Transition to Eliquis 5 mg bid when no more procedures (based on most recent afib guidelines).   - Continue po amiodarone.   WBCs continue to rise, at this point may be due to prednisone.  Afebrile.   [name] 05/04/2017 9:59 AM

## 2017-05-04 NOTE — Procedures (Signed)
I was present at this dialysis session. I have reviewed the session itself and made appropriate changes.   On daily HD.  UF 3.7L yesterday. Seen by [facility] for poorly performing temp HD cath, for Tunneled [facility] cath today; permanent access will be delayed until health better optimized.  4K bath. UF goal 6L. Plan HD again tomorrow, more UF. Tight heparin.    Filed Weights   05/04/17 0700  Weight: (!) 145 kg (319 lb 10.7 oz)    Recent Labs  Lab 05/04/17 0540  NA 131*  K 3.5  CL 94*  CO2 21*  GLUCOSE 217*  BUN 58*  CREATININE 5.58*  CALCIUM 8.5*  PHOS 6.9*    Recent Labs  Lab 05/02/17 0750 05/03/17 0838 05/04/17 0540  WBC 27.0* 27.6* 33.3*  NEUTROABS 22.1* 25.1* 30.6*  HGB 9.8* 9.8* 9.7*  HCT 29.4* 29.2* 29.1*  MCV 83.3 83.4 83.4  PLT 503* 484* 539*    Scheduled Meds: . amiodarone  400 mg Oral BID  . carvedilol  18.75 mg Oral BID WC  . colchicine  0.3 mg Oral Daily  . fluticasone  2 spray Each Nare Daily  . gabapentin  300 mg Oral BID  . insulin aspart  0-15 Units Subcutaneous TID WC  . insulin aspart  0-5 Units Subcutaneous QHS  . insulin glargine  25 Units Subcutaneous QHS  . [START ON 05/05/2017] predniSONE  50 mg Oral Q breakfast  . sodium chloride flush  3 mL Intravenous Q12H  . sodium chloride flush  3 mL Intravenous Q12H   Continuous Infusions: . sodium chloride    . sodium chloride    .  ceFAZolin (ANCEF) IV    . heparin 2,350 Units/hr (05/04/17 0715)   PRN Meds:.sodium chloride, acetaminophen, lidocaine (PF), methocarbamol, ondansetron (ZOFRAN) IV, sodium chloride flush, sodium chloride flush, sodium chloride flush   Pearson Grippe  MD 05/04/2017, 9:03 AM

## 2017-05-04 NOTE — Interval H&P Note (Signed)
   History and Physical Update  The patient was interviewed and re-examined.  The patient's previous History and Physical has been reviewed and is unchanged from Dr. Luther Parody consult except for: interval temporary dialysis catheter placement in right internal jugular vein by IR.  Plan is exchange for right internal jugular vein temporary dialysis catheter for tunneled dialysis catheter, possible tunneled dialysis catheter placement.   The patient is aware the risks of tunneled dialysis catheter placement include but are not limited to: bleeding, infection, central venous injury, pneumothorax, possible venous stenosis, possible malpositioning in the venous system, and possible infections related to long-term catheter presence.   The patient was aware of these risks and agreed to proceed.   Adele Barthel, MD, FACS [facility] [place] Office: [phone] Pager: [phone]  05/04/2017, 1:26 PM

## 2017-05-05 ENCOUNTER — Encounter (HOSPITAL_COMMUNITY): Payer: Self-pay | Admitting: Vascular Surgery

## 2017-05-05 LAB — RENAL FUNCTION PANEL
ALBUMIN: 2.5 g/dL — AB (ref 3.5–5.0)
Anion gap: 14 (ref 5–15)
BUN: 31 mg/dL — AB (ref 6–20)
CALCIUM: 8.2 mg/dL — AB (ref 8.9–10.3)
CO2: 22 mmol/L (ref 22–32)
Chloride: 98 mmol/L — ABNORMAL LOW (ref 101–111)
Creatinine, Ser: 4.09 mg/dL — ABNORMAL HIGH (ref 0.61–1.24)
GFR calc Af Amer: 19 mL/min — ABNORMAL LOW (ref >60)
GFR calc non Af Amer: 16 mL/min — ABNORMAL LOW (ref >60)
GLUCOSE: 154 mg/dL — AB (ref 65–99)
PHOSPHORUS: 3.6 mg/dL (ref 2.5–4.6)
Potassium: 3.4 mmol/L — ABNORMAL LOW (ref 3.5–5.1)
SODIUM: 134 mmol/L — AB (ref 135–145)

## 2017-05-05 LAB — CBC WITH DIFFERENTIAL/PLATELET
Basophils Absolute: 0 10*3/uL (ref 0.0–0.1)
Basophils Relative: 0 %
EOS ABS: 0 10*3/uL (ref 0.0–0.7)
EOS PCT: 0 %
HCT: 29.2 % — ABNORMAL LOW (ref 39.0–52.0)
Hemoglobin: 9.8 g/dL — ABNORMAL LOW (ref 13.0–17.0)
LYMPHS ABS: 1.9 10*3/uL (ref 0.7–4.0)
Lymphocytes Relative: 7 %
MCH: 28 pg (ref 26.0–34.0)
MCHC: 33.6 g/dL (ref 30.0–36.0)
MCV: 83.4 fL (ref 78.0–100.0)
MONO ABS: 1.9 10*3/uL — AB (ref 0.1–1.0)
Monocytes Relative: 7 %
Neutro Abs: 23 10*3/uL — ABNORMAL HIGH (ref 1.7–7.7)
Neutrophils Relative %: 86 %
PLATELETS: 530 10*3/uL — AB (ref 150–400)
RBC: 3.5 MIL/uL — AB (ref 4.22–5.81)
RDW: 18.3 % — AB (ref 11.5–15.5)
WBC: 26.8 10*3/uL — AB (ref 4.0–10.5)

## 2017-05-05 LAB — COMPREHENSIVE METABOLIC PANEL
ALT: 7 U/L — ABNORMAL LOW (ref 17–63)
AST: 12 U/L — AB (ref 15–41)
Albumin: 2.4 g/dL — ABNORMAL LOW (ref 3.5–5.0)
Alkaline Phosphatase: 74 U/L (ref 38–126)
Anion gap: 15 (ref 5–15)
BUN: 50 mg/dL — ABNORMAL HIGH (ref 6–20)
CHLORIDE: 97 mmol/L — AB (ref 101–111)
CO2: 21 mmol/L — ABNORMAL LOW (ref 22–32)
Calcium: 8.3 mg/dL — ABNORMAL LOW (ref 8.9–10.3)
Creatinine, Ser: 5.42 mg/dL — ABNORMAL HIGH (ref 0.61–1.24)
GFR, EST AFRICAN AMERICAN: 13 mL/min — AB (ref >60)
GFR, EST NON AFRICAN AMERICAN: 12 mL/min — AB (ref >60)
Glucose, Bld: 138 mg/dL — ABNORMAL HIGH (ref 65–99)
POTASSIUM: 3.1 mmol/L — AB (ref 3.5–5.1)
Sodium: 133 mmol/L — ABNORMAL LOW (ref 135–145)
Total Bilirubin: 0.6 mg/dL (ref 0.3–1.2)
Total Protein: 5.9 g/dL — ABNORMAL LOW (ref 6.5–8.1)

## 2017-05-05 LAB — GLUCOSE, CAPILLARY
Glucose-Capillary: 136 mg/dL — ABNORMAL HIGH (ref 65–99)
Glucose-Capillary: 145 mg/dL — ABNORMAL HIGH (ref 65–99)
Glucose-Capillary: 238 mg/dL — ABNORMAL HIGH (ref 65–99)

## 2017-05-05 LAB — HEPARIN LEVEL (UNFRACTIONATED): HEPARIN UNFRACTIONATED: 0.46 [IU]/mL (ref 0.30–0.70)

## 2017-05-05 LAB — MAGNESIUM: Magnesium: 2.2 mg/dL (ref 1.7–2.4)

## 2017-05-05 LAB — PHOSPHORUS: PHOSPHORUS: 6.1 mg/dL — AB (ref 2.5–4.6)

## 2017-05-05 MED ORDER — APIXABAN 5 MG PO TABS
5.0000 mg | ORAL_TABLET | Freq: Two times a day (BID) | ORAL | Status: DC
  Administered 2017-05-05 (×2): 5 mg via ORAL
  Filled 2017-05-05 (×4): qty 1

## 2017-05-05 MED ORDER — HEPARIN SODIUM (PORCINE) 1000 UNIT/ML DIALYSIS: 20.0000 [IU]/kg | INTRAMUSCULAR | Status: DC | PRN

## 2017-05-05 MED ORDER — SODIUM CHLORIDE 0.9 % IV SOLN: 100.0000 mL | INTRAVENOUS | Status: DC | PRN

## 2017-05-05 MED ORDER — ALTEPLASE 2 MG IJ SOLR: 2.0000 mg | Freq: Once | INTRAMUSCULAR | Status: DC | PRN

## 2017-05-05 MED ORDER — LIDOCAINE HCL (PF) 1 % IJ SOLN: 5.0000 mL | INTRAMUSCULAR | Status: DC | PRN

## 2017-05-05 MED ORDER — LIDOCAINE-PRILOCAINE 2.5-2.5 % EX CREA: 1.0000 "application " | TOPICAL_CREAM | CUTANEOUS | Status: DC | PRN

## 2017-05-05 MED ORDER — HEPARIN SODIUM (PORCINE) 1000 UNIT/ML DIALYSIS: 1000.0000 [IU] | INTRAMUSCULAR | Status: DC | PRN

## 2017-05-05 MED ORDER — PENTAFLUOROPROP-TETRAFLUOROETH EX AERO: 1.0000 "application " | INHALATION_SPRAY | CUTANEOUS | Status: DC | PRN

## 2017-05-05 NOTE — Procedures (Signed)
I was present at this dialysis session. I have reviewed the session itself and made appropriate changes.   S/p R IJ Tunneled HD Cath yesterday with VVS, appreciate assistance TOl UF 6L yesterday On HD again today, 6L goal, 4K bath, Qb now 400! Weights are hard to interpret.  Significant 4+ edema persisst into proximal thighs   Filed Weights   05/04/17 0700 05/04/17 1110 05/05/17 0632  Weight: (!) 145 kg (319 lb 10.7 oz) (!) 139 kg (306 lb 7 oz) (!) 158 kg (348 lb 5.2 oz)    Recent Labs  Lab 05/05/17 0430  NA 133*  K 3.1*  CL 97*  CO2 21*  GLUCOSE 138*  BUN 50*  CREATININE 5.42*  CALCIUM 8.3*  PHOS 6.1*    Recent Labs  Lab 05/03/17 0838 05/04/17 0540 05/05/17 0430  WBC 27.6* 33.3* 26.8*  NEUTROABS 25.1* 30.6* 23.0*  HGB 9.8* 9.7* 9.8*  HCT 29.2* 29.1* 29.2*  MCV 83.4 83.4 83.4  PLT 484* 539* 530*    Scheduled Meds: . amiodarone  400 mg Oral BID  . carvedilol  18.75 mg Oral BID WC  . colchicine  0.3 mg Oral Daily  . fluticasone  2 spray Each Nare Daily  . gabapentin  300 mg Oral Daily  . insulin aspart  0-15 Units Subcutaneous TID WC  . insulin aspart  0-5 Units Subcutaneous QHS  . insulin glargine  25 Units Subcutaneous QHS  . predniSONE  50 mg Oral Q breakfast  . sodium chloride flush  3 mL Intravenous Q12H  . sodium chloride flush  3 mL Intravenous Q12H   Continuous Infusions: . sodium chloride    . sodium chloride    . sodium chloride 10 mL/hr at 05/04/17 1220  . heparin 2,350 Units/hr (05/05/17 0448)   PRN Meds:.sodium chloride, acetaminophen, methocarbamol, ondansetron (ZOFRAN) IV, sodium chloride flush, sodium chloride flush, sodium chloride flush   Pearson Grippe  MD 05/05/2017, 9:08 AM

## 2017-05-05 NOTE — Progress Notes (Addendum)
Rehab Admissions Coordinator Note:  Patient was screened by Cleatrice Burke for appropriateness for an Inpatient Acute Rehab Consult per OT recommendation.   At this time, we are recommending Inpatient Rehab consult. I have Texted MD to request order.  Cleatrice Burke 05/05/2017, 6:06 PM  I can be reached at [phone].

## 2017-05-05 NOTE — Evaluation (Signed)
Occupational Therapy Evaluation Patient Details Name: John Bailey MRN: [id number] DOB: [birth year] Today's Date: 05/05/2017    History of Present Illness John Bailey is a 47 y.o. rheumatoid arthritis, diabetes mellitus, heart failure, atrial fibrillation. Patient presented with dyspnea and found to have a CHF exacerbation   Clinical Impression   Pt presents to OT evaluation with significantly limited B UE strength and L UE pain as well as generalized weakness and decreased activity tolerance impacting his ability to participate in ADL tasks. He demonstrates increased proximal strength in B UE as compared to distal. He requires max assist +2 for bed mobility in preparation for ADL seated at EOB, total assist for LB ADL, mod assist for UB ADL, and max assist for grooming tasks. Facilitated improved B UE strength in preparation for functional ADL participation while seated at EOB. Pt is motivated to return to independence with ADL participation. Feel he is a good candidate for CIR level therapies post-acute D/C. Will continue to follow while admitted.     Follow Up Recommendations  CIR;Supervision/Assistance - 24 hour    Equipment Recommendations  Other (comment)(defer to next venue of care)    Recommendations for Other Services Rehab consult     Precautions / Restrictions Precautions Precautions: Fall Restrictions Weight Bearing Restrictions: No      Mobility Bed Mobility Overal bed mobility: Needs Assistance Bed Mobility: Supine to Sit;Sit to Supine Rolling: Mod assist;+2 for safety/equipment   Supine to sit: Max assist;+2 for physical assistance Sit to supine: Max assist;+2 for physical assistance(+3)   General bed mobility comments: Pt requiring significant assistance for bed mobility but able to initiate tasks.   Transfers                 General transfer comment: Discussed plan to attempt standing next session.    Balance Overall balance assessment: Needs  assistance Sitting-balance support: Single extremity supported;Feet supported Sitting balance-Leahy Scale: Fair Sitting balance - Comments: Supervision for static tasks up to mod assist for dynamic tasks while attempting to scoot self back in bed.                                    ADL either performed or assessed with clinical judgement   ADL Overall ADL's : Needs assistance/impaired Eating/Feeding: Moderate assistance;Sitting   Grooming: Maximal assistance;Sitting   Upper Body Bathing: Maximal assistance;Sitting   Lower Body Bathing: Total assistance;Sitting/lateral leans   Upper Body Dressing : Maximal assistance;Sitting   Lower Body Dressing: Total assistance;Sitting/lateral leans       Toileting- Clothing Manipulation and Hygiene: Total assistance;Bed level         General ADL Comments: Pt able to sit at EOB for strengthening activities in preparation for ADL. Able to reach with RUE to back of head.     Vision Patient Visual Report: No change from baseline Vision Assessment?: No apparent visual deficits     Perception     Praxis      Pertinent Vitals/Pain Pain Assessment: Faces Faces Pain Scale: Hurts even more Pain Location: L wrist; B LE Pain Descriptors / Indicators: Discomfort;Constant;Grimacing Pain Intervention(s): Monitored during session;Repositioned     Hand Dominance     Extremity/Trunk Assessment Upper Extremity Assessment Upper Extremity Assessment: RUE deficits/detail;LUE deficits/detail RUE Deficits / Details: Increased proximal strength as compared to distal. Able to demonstrate 3/5 strength at elbow and shoulder (limited ROM due to HD catheter) and  2/5 with grasp.  LUE Deficits / Details: Significant pain (and noted increased temperature) in L wrist significantly limiting LUE functional use. Pt able to complete minimal digit AROM today. 2+/5 strength grossly at elbow and shoulder (also may have been limited by wrist pain)    Lower Extremity Assessment Lower Extremity Assessment: Defer to PT evaluation       Communication Communication Communication: No difficulties   Cognition Arousal/Alertness: Awake/alert Behavior During Therapy: WFL for tasks assessed/performed Overall Cognitive Status: Within Functional Limits for tasks assessed                                 General Comments: at times slow to respond but appears [facility]   General Comments  VSS throughout session    Exercises Exercises: Other exercises Other Exercises Other Exercises: AROM R elbow x5 repetitions against gravity Other Exercises: Scapular elevation/depression, retraction/protraction, forward circles and backward circles x10 repetitions.    Shoulder Instructions      Home Living Family/patient expects to be discharged to:: Private residence Living Arrangements: Spouse/significant other Available Help at Discharge: Family Type of Home: House Home Access: Level entry     Home Layout: (stays in basement downstairs, goes upstairs each night) Alternate Level Stairs-Number of Steps: 2 flights (8-10 each) Alternate Level Stairs-Rails: Left Bathroom Shower/Tub: Occupational psychologist: Standard     Home Equipment: Cane - single point          Prior Functioning/Environment Level of Independence: Needs assistance  Gait / Transfers Assistance Needed: Required assistance for standing from lift chair as it is not functioning properly              OT Problem List: Decreased strength;Decreased range of motion;Decreased activity tolerance;Impaired balance (sitting and/or standing);Decreased safety awareness;Decreased knowledge of use of DME or AE;Decreased knowledge of precautions      OT Treatment/Interventions: Self-care/ADL training;Therapeutic exercise;Energy conservation;DME and/or AE instruction;Therapeutic activities;Patient/family education;Balance training;Neuromuscular education    OT  Goals(Current goals can be found in the care plan section) Acute Rehab OT Goals Patient Stated Goal: To walk out of the hospital OT Goal Formulation: With patient Time For Goal Achievement: 05/19/17 Potential to Achieve Goals: Good  OT Frequency: Min 2X/week   Barriers to D/C:            Co-evaluation              AM-PAC PT "6 Clicks" Daily Activity     Outcome Measure Help from another person eating meals?: A Lot Help from another person taking care of personal grooming?: A Lot Help from another person toileting, which includes using toliet, bedpan, or urinal?: Total Help from another person bathing (including washing, rinsing, drying)?: A Lot Help from another person to put on and taking off regular upper body clothing?: A Lot Help from another person to put on and taking off regular lower body clothing?: Total 6 Click Score: 10   End of Session Nurse Communication: Mobility status  Activity Tolerance: Patient tolerated treatment well Patient left: in bed;with call bell/phone within reach;with bed alarm set  OT Visit Diagnosis: Other abnormalities of gait and mobility (R26.89);Muscle weakness (generalized) (M62.81);Pain Pain - Right/Left: Left Pain - part of body: (wrist; also generalized pain throughout body and B LE)                Time: 3007-6226 OT Time Calculation (min): 33 min Charges:  OT General Charges $  OT Visit: 1 Visit OT Evaluation $OT Eval Moderate Complexity: 1 Mod G-Codes:     Norman Herrlich, MS OTR/L  Pager: [place] [facility] Dexter Sauser 05/05/2017, 5:15 PM

## 2017-05-05 NOTE — Anesthesia Postprocedure Evaluation (Signed)
Anesthesia Post Note  Patient: John Bailey  Procedure(s) Performed: INSERTION OF TUNNELED  DIALYSIS CATHETER (Right Chest)     Patient location during evaluation: PACU Anesthesia Type: MAC Level of consciousness: awake and alert Pain management: pain level controlled Vital Signs Assessment: post-procedure vital signs reviewed and stable Respiratory status: spontaneous breathing, nonlabored ventilation, respiratory function stable and patient connected to nasal cannula oxygen Cardiovascular status: stable and blood pressure returned to baseline Postop Assessment: no apparent nausea or vomiting Anesthetic complications: no    Last Vitals:  Vitals:   05/05/17 0900 05/05/17 0930  BP: 105/72 106/64  Pulse: 71 72  Resp:    Temp:    SpO2:      Last Pain:  Vitals:   05/05/17 0800  TempSrc: Oral  PainSc: 0-No pain                 [name]

## 2017-05-05 NOTE — Progress Notes (Signed)
Physical Therapy Treatment Patient Details Name: COLTAN SPINELLO MRN: [id number] DOB: 1970-07-29 Today's Date: 05/05/2017    History of Present Illness SLADEN PLANCARTE is a 47 y.o. rheumatoid arthritis, diabetes mellitus, heart failure, atrial fibrillation. Patient presented with dyspnea and found to have a CHF exacerbation    PT Comments    Pt noticeably improving with less edema to overcome with better isolated movement and overall improved function.  Emphasis on sitting EOB with more patient participation.  Worked to get pt in adequate alignment to attempt standing with pt agreeing to attempt standing 2/21.  Working toward getting to a chair. Hope to progress toward CIR appropriate level.    Follow Up Recommendations  SNF;Supervision/Assistance - 24 hour     Equipment Recommendations  (TBA)    Recommendations for Other Services       Precautions / Restrictions Precautions Precautions: Fall Restrictions Weight Bearing Restrictions: No    Mobility  Bed Mobility Overal bed mobility: Needs Assistance Bed Mobility: Supine to Sit;Sit to Supine Rolling: Mod assist;+2 for safety/equipment   Supine to sit: Max assist;+2 for physical assistance Sit to supine: Max assist;+2 for physical assistance(+3)   General bed mobility comments: Pt requiring significant assistance for bed mobility but able to initiate tasks.   Transfers                 General transfer comment: Discussed plan to attempt standing next session.  Ambulation/Gait                 Stairs            Wheelchair Mobility    Modified Rankin (Stroke Patients Only)       Balance Overall balance assessment: Needs assistance Sitting-balance support: Single extremity supported;Feet supported Sitting balance-Leahy Scale: Fair Sitting balance - Comments: Supervision for static tasks up to mod assist for dynamic tasks while attempting to scoot self back in bed.   pt able to self position his  legs into proper alignment for standing.                                    Cognition Arousal/Alertness: Awake/alert Behavior During Therapy: WFL for tasks assessed/performed Overall Cognitive Status: Within Functional Limits for tasks assessed                                 General Comments: at times slow to respond but appears [facility]      Exercises Other Exercises Other Exercises: AROM R elbow x5 repetitions against gravity Other Exercises: Scapular elevation/depression, retraction/protraction, forward circles and backward circles x10 repetitions.  Other Exercises: bil LE A/AAROM with graded resistance in both flexion extertion, R UE A/AAROM, shod <90*    General Comments General comments (skin integrity, edema, etc.): vss throughout      Pertinent Vitals/Pain Pain Assessment: Faces Faces Pain Scale: Hurts even more Pain Location: L wrist; B LE Pain Descriptors / Indicators: Discomfort;Constant;Grimacing Pain Intervention(s): Monitored during session;Repositioned    Home Living Family/patient expects to be discharged to:: Private residence Living Arrangements: Spouse/significant other Available Help at Discharge: Family Type of Home: House Home Access: Level entry   Home Layout: (stays in basement downstairs, goes upstairs each night) Home Equipment: Kasandra Knudsen - single point      Prior Function Level of Independence: Needs assistance  Gait / Transfers Assistance Needed:  Required assistance for standing from lift chair as it is not functioning properly       PT Goals (current goals can now be found in the care plan section) Acute Rehab PT Goals Patient Stated Goal: To walk out of the hospital PT Goal Formulation: With patient Time For Goal Achievement: 05/08/17 Potential to Achieve Goals: Fair Progress towards PT goals: Progressing toward goals    Frequency    Min 3X/week      PT Plan Current plan remains appropriate     Co-evaluation              AM-PAC PT "6 Clicks" Daily Activity  Outcome Measure  Difficulty turning over in bed (including adjusting bedclothes, sheets and blankets)?: Unable Difficulty moving from lying on back to sitting on the side of the bed? : Unable Difficulty sitting down on and standing up from a chair with arms (e.g., wheelchair, bedside commode, etc,.)?: Unable Help needed moving to and from a bed to chair (including a wheelchair)?: Total Help needed walking in hospital room?: Total Help needed climbing 3-5 steps with a railing? : Total 6 Click Score: 6    End of Session   Activity Tolerance: Patient tolerated treatment well Patient left: in bed;with call bell/phone within reach;with bed alarm set Nurse Communication: Mobility status PT Visit Diagnosis: Other abnormalities of gait and mobility (R26.89);Muscle weakness (generalized) (M62.81)     Time: 8337-4451 PT Time Calculation (min) (ACUTE ONLY): 42 min  Charges:  $Therapeutic Activity: 23-37 mins                    G Codes:       05-26-2017  Donnella Sham, PT [phone]  (pager)   Tessie Fass Maebelle Sulton 2017-05-26, 6:59 PM

## 2017-05-05 NOTE — Progress Notes (Signed)
ANTICOAGULATION CONSULT NOTE - Follow Up Consult  Pharmacy Consult for heparin infusion > transition to Eliquis Indication: atrial fibrillation  No Known Allergies  Patient Measurements: Height: 6' (182.9 cm) Weight: (!) 344 lb 2.2 oz (156.1 kg) IBW/kg (Calculated) : 77.6 Heparin Dosing Weight: 119.5 kg  Vital Signs: Temp: 98.4 F (36.9 C) (02/20 0800) Temp Source: Oral (02/20 0800) BP: 115/53 (02/20 1200) Pulse Rate: 78 (02/20 1200)  Labs: Recent Labs    05/03/17 0838  05/03/17 1818 05/04/17 0540 05/05/17 0430  HGB 9.8*  --   --  9.7* 9.8*  HCT 29.2*  --   --  29.1* 29.2*  PLT 484*  --   --  539* 530*  LABPROT  --   --   --  16.5*  --   INR  --   --   --  1.34  --   HEPARINUNFRC  --    < > 0.45 0.73* 0.46  CREATININE 5.38*  --   --  5.58* 5.42*   < > = values in this interval not displayed.    Estimated Creatinine Clearance: 26.8 mL/min (A) (by C-G formula based on SCr of 5.42 mg/dL (H)).   Medications: . sodium chloride    . sodium chloride    . sodium chloride 10 mL/hr at 05/04/17 1220  . sodium chloride    . sodium chloride    . heparin 2,350 Units/hr (05/05/17 0448)    Assessment: 4 yom on Xarelto PTA for Afib. Patient has marked volume overload with increasing Scr and decreasing urine output, despite lasix infusion and metolazone doses. Last dose of Xarelto was on 2/11 at 2154. Given recent Xarelto exposure, will evaluate both aPTT and heparin levels until correlate.   Successful cardioversion 2/15. Heparin level this morning is slightly above goal range, on 2450 units/hr. No infusion issues. No signs/symptoms of bleeding.   Asked to transition to Eliquis today.  Goal of Therapy:  Monitor platelets by anticoagulation protocol: Yes   Plan:  D/C IV heparin  Eliquis 5 mg BID.  Uvaldo Rising, BCPS  Clinical Pharmacist Pager [phone]  05/05/2017 12:25 PM

## 2017-05-05 NOTE — Progress Notes (Signed)
Patient ID: John Bailey, male   DOB: Nov 15, 1970, 47 y.o.   MRN: [id number] P    Advanced Heart Failure Rounding Note  PCP-Cardiologist: Pixie Casino, MD  HF Cardiologist: [name], MD  Subjective:    DCCV on 2/15, he remains in NSR. Now on PO amio.  Tunneled HD cath placed 2/19  Echo (04/25/17): EF 55-60%, severe LVH.   HD yesterday with 6 L off. Weights inaccurate. WBC trending back down.  No CP, SOB, or bleeding. Didn't get out of bed yesterday. Diarrhea has slowed down.  Objective:   Weight Range: (!) 348 lb 5.2 oz (158 kg) Body mass index is 47.24 kg/m.   Vital Signs:   Temp:  [97.5 F (36.4 C)-97.9 F (36.6 C)] 97.5 F (36.4 C) (02/20 0814) Pulse Rate:  [64-72] 65 (02/20 0632) Resp:  [17-59] 17 (02/20 4818) BP: (93-152)/(58-114) 109/68 (02/20 5631) SpO2:  [92 %-100 %] 100 % (02/20 4970) Weight:  [306 lb 7 oz (139 kg)-348 lb 5.2 oz (158 kg)] 348 lb 5.2 oz (158 kg) (02/20 0632) Last BM Date: 05/04/17  Weight change: Filed Weights   05/04/17 0700 05/04/17 1110 05/05/17 0632  Weight: (!) 319 lb 10.7 oz (145 kg) (!) 306 lb 7 oz (139 kg) (!) 348 lb 5.2 oz (158 kg)   Intake/Output:   Intake/Output Summary (Last 24 hours) at 05/05/2017 0714 Last data filed at 05/05/2017 2637 Gross per 24 hour  Intake 936.68 ml  Output 6010 ml  Net -5073.32 ml     Physical Exam  General: obese, No resp difficulty. HEENT: Normal Neck: Supple. JVP to ear. Carotids 2+ bilat; no bruits. No thyromegaly or nodule noted. Cor: PMI nondisplaced. RRR, No M/G/R noted. distant Lungs: CTAB, normal effort. Abdomen: obese, non-tender, +distended, no HSM. No bruits or masses. +BS  Extremities: No cyanosis, clubbing, or rash. R and LLE +2 edema up to knees, RUE with mepilex in place. Right IJ tunneled HD cath in place. Neuro: Alert & orientedx3, cranial nerves grossly intact. moves all 4 extremities w/o difficulty. Affect pleasant   Telemetry  SR 60's with 1st degree AV block.  Personally reviewed.  Labs    CBC Recent Labs    05/04/17 0540 05/05/17 0430  WBC 33.3* 26.8*  NEUTROABS 30.6* PENDING  HGB 9.7* 9.8*  HCT 29.1* 29.2*  MCV 83.4 83.4  PLT 539* 858*   Basic Metabolic Panel Recent Labs    05/04/17 0540 05/05/17 0430  NA 131* 133*  K 3.5 3.1*  CL 94* 97*  CO2 21* 21*  GLUCOSE 217* 138*  BUN 58* 50*  CREATININE 5.58* 5.42*  CALCIUM 8.5* 8.3*  MG 2.3 2.2  PHOS 6.9* 6.1*   Liver Function Tests Recent Labs    05/04/17 0540 05/05/17 0430  AST 12* 12*  ALT 8* 7*  ALKPHOS 70 74  BILITOT 0.6 0.6  PROT 6.1* 5.9*  ALBUMIN 2.3* 2.4*   No results for input(s): LIPASE, AMYLASE in the last 72 hours. Cardiac Enzymes No results for input(s): CKTOTAL, CKMB, CKMBINDEX, TROPONINI in the last 72 hours.  BNP: BNP (last 3 results) Recent Labs    03/06/17 1800 04/22/17 1734 04/23/17 0736  BNP 791.5* 531.3* 627.9*    ProBNP (last 3 results) No results for input(s): PROBNP in the last 8760 hours.   D-Dimer No results for input(s): DDIMER in the last 72 hours. Hemoglobin A1C No results for input(s): HGBA1C in the last 72 hours. Fasting Lipid Panel No results for input(s): CHOL, HDL,  LDLCALC, TRIG, CHOLHDL, LDLDIRECT in the last 72 hours. Thyroid Function Tests No results for input(s): TSH, T4TOTAL, T3FREE, THYROIDAB in the last 72 hours.  Invalid input(s): FREET3  Other results:   Imaging       Medications:     Scheduled Medications: . amiodarone  400 mg Oral BID  . carvedilol  18.75 mg Oral BID WC  . colchicine  0.3 mg Oral Daily  . fluticasone  2 spray Each Nare Daily  . gabapentin  300 mg Oral Daily  . insulin aspart  0-15 Units Subcutaneous TID WC  . insulin aspart  0-5 Units Subcutaneous QHS  . insulin glargine  25 Units Subcutaneous QHS  . predniSONE  50 mg Oral Q breakfast  . sodium chloride flush  3 mL Intravenous Q12H  . sodium chloride flush  3 mL Intravenous Q12H    Infusions: . sodium chloride    .  sodium chloride    . sodium chloride 10 mL/hr at 05/04/17 1220  . heparin 2,350 Units/hr (05/05/17 0448)    PRN Medications: sodium chloride, acetaminophen, methocarbamol, ondansetron (ZOFRAN) IV, sodium chloride flush, sodium chloride flush, sodium chloride flush    Assessment/Plan   1. Atrial fibrillation with RVR: On review of prior ECGs, last time he was documented to be in NSR was in 1/18. I suspect that he may be better compensated in NSR. His HR has been difficult to control.  He remains in NSR after DCCV on 2/15.   - Continue PO amio 400 mg BID - Continue Coreg 18.75 mg BID - Now that he has tunneled HD cath, transition to Eliquis 5 mg bid based on most recent afib guidelines  2. Acute on chronic diastolic CHF: Echo this admission with severe LVH, EF 55-60%. Marked volume overload with anasarca. Fluid now coming off with HD but he has a long way to go.  - Continue daily HD per renal.  No change. - With low voltage and severe LVH, would send UPEP/SPEP, consider nuclear scan for TTR in future if negative SPEP/UPEP. Abnormal SPEP suggesting acute inflammatory response. No UPEP sent with little urine output. 3. AKI on CKD Stage IV: I suspect that he will be dialysis dependent for volume management. Continue daily HD. Now has tunneled HD cath 4. Fever: Afebrile today though WBCs still high.  Cultures negative.  Suspicion for RA flare, high ESR and CRP.  He has been started on prednisone for management.  Knee pain improved.  Afebrile. WBC trending back down 33 > 26 5. HTN: Improved with volume removal. SBP 90-100's 6. Knee pain: Likely related to rheumatoid arthritis. Knee Xray with subQ edema. No acute osseous findings. Improved. No change 7. Deconditioning - PT recommending SNF.   Length of Stay: [phone], NP  05/05/2017, 7:14 AM  Advanced Heart Failure Team Pager [phone] (M-F; [place])  Please contact [facility] for night-coverage after hours (4p -7a ) and weekends  on [email or web address]  Patient seen with NP, agree with the above note.  He remains in NSR.  Ongoing HD with fluid removal.  He will likely need long-term HD.   When he is finished with procedures, would transition from heparin gtt to apixaban 5 mg bid (based on latest guidelines for anticoagulation for atrial fibrillation in setting of ESRD).   [name] 05/05/2017 11:51 AM

## 2017-05-05 NOTE — Progress Notes (Signed)
PROGRESS NOTE    John Bailey  BSJ:[id number] DOB: 1971/02/09 DOA: 04/22/2017 PCP: John Reach, MD   Chief Complaint  Patient presents with  . Generalized body swelling    Brief Narrative:  HPI on 04/22/2017 by Dr. Quintella Bailey This is a 47 year old gentleman who presents with complaints of weight gain, increasing edema and difficulty walking as a result.  This is a patient with rheumatoid arthritis, diabetes mellitus, chronic fluid overload.  He was started on prednisone approximately 9 weeks ago to treat his rheumatoid arthritis.  At that time his Lasix was continued.  He was maintained on prednisone for 6 weeks.  His Lasix was restarted approximately 3 weeks ago.  With his Lasix being held he has gained weight, he states approximately 15 pounds.  Despite being restarted on his Lasix he is not lost any weight to the point now it has become difficult for him to walk.  He saw his PCP today and was sent him to the ER.  The patient denies any cough, wheeze or chest pains.  He does report increased shortness of breath with activity.  History provided by the patient.  The ER the patient is also hypoxic with ambulation, his oxygen sats going down to 80% with ambulation.  Interim history  Admitted for Acute CHF. Was placed on IV Lasix however this was discontinued as patient developed worsening renal failure.  Nephrology consulted and appreciated, patient started dialysis.  Assessment & Plan   Acute on chronic diastolic heart failure -Echocardiogram showed an EF of 55-60% with severe LVH, dilated left atrium and trivial pericardial effusion.  Unable to assess diastolic dysfunction. -Patient was started on IV Lasix however this was discontinued due to poor urine output and worsening creatinine  -Nephrology was consulted and appreciated, patient started dialysis -Vascular surgery consulted and appreciated, for permanent HD access placement  Anasarca -Secondary to volume overload  -continue  management with dialysis  Rheumatoid arthritis with flare -Patient receives Abatacept every Tuesday -knee x-rays negative for knee effusion -Continue methocarbamol, gabapentin -John Bailey, previous hospitalist discussed with John Bailey (patient's rheumatologist)- recommended prednisone with taper, starting at 60mg  -Continue to taper prednisone  Atrial fibrillation with RVR -Cardiology consulted and appreciated -Status post DCCV cardioversion,  -Patient transitioned to Coreg and oral amiodarone -Currently in normal sinus rhythm -Patient currently on heparin until all procedures are completed, then can be transitioned back to Eliquis  Acute kidney injury on chronic kidney disease, stage IV -Appears to be worsening and progressive -As above, nephrology is consulted and appreciated, patient started on hemodialysis -Vascular surgery consulted and appreciated for permanent access placement  Hypertension -Blood pressure currently stable -Cardiology, Dr. Caryl Bailey checking to rule out amyloid given the degree of LVH -Immunoelectrophoresis to check for amyloid however unable to collect 24-hour urine due to worsening renal failure  Hyperkalemia/hypokalemia -K 3.1 today, suspect will be replaced with dialysis  -Continue to monitor BMP  Leukocytosis -Possibly secondary to RA flare versus steroids -Previous hospitalist discussed with infectious disease, Dr. Drucilla Bailey who believes WBC to be related to RA instead of infection -WBC down to 26.8 today  Diabetes mellitus, type II -Continue Lantus and insulin sliding scale with CBG monitoring  Hyperphosphatemia  -continue dialysis management and monitor  Diarrhea -John Bailey checked and was unremarkable, GI pathogen panel negative -Continue to monitor  Physical deconditioning -PT/OT consulted and recommended SNF -Social work consulted  DVT Prophylaxis heparin  Code Status: Full  Family Communication: None at bedside  Disposition  Plan: Admitted  Consultants Cardiology/heart failure team Nephrology  Interventional radiology Infectious disease via phone Rheumatology via phone Vascular surgery  Procedures  Echocardiogram Right upper extremity vein mapping RIJ TDC, RIJ vein cannulation under [place], removal of RIJ veini temp dialysis catheter   Antibiotics   Anti-infectives (From admission, onward)   Start     Dose/Rate Route Frequency Ordered Stop   05/04/17 1215  ceFAZolin (ANCEF) 3 g in dextrose 5 % 50 mL IVPB    Comments:  Send with pt to OR   3 g 130 mL/hr over 30 Minutes Intravenous To Surgery 05/04/17 1202 05/04/17 1450   05/04/17 0600  ceFAZolin (ANCEF) IVPB 1 g/50 mL premix  Status:  Discontinued    Comments:  Send with pt to OR   1 g 100 mL/hr over 30 Minutes Intravenous To Surgery 05/03/17 1606 05/04/17 1202      Subjective:   John Bailey seen and examined today in dialysis. Has complaints.  Denies chest pain, shortness of pain, nausea, vomiting, diarrhea, constipation, dizziness, or headache. Objective:   Vitals:   05/05/17 1100 05/05/17 1130 05/05/17 1200 05/05/17 1220  BP: 102/63 119/83 (!) 115/53 114/71  Pulse: 78 79 78 84  Resp:    18  Temp:    97.6 F (36.4 C)  TempSrc:    Oral  SpO2:    96%  Weight:    (!) 149.9 kg (330 lb 7.5 oz)  Height:        Intake/Output Summary (Last 24 hours) at 05/05/2017 1626 Last data filed at 05/05/2017 1600 Gross per 24 hour  Intake 1294.67 ml  Output 6000 ml  Net -4705.33 ml   Filed Weights   05/05/17 1610 05/05/17 0800 05/05/17 1220  Weight: (!) 158 kg (348 lb 5.2 oz) (!) 156.1 kg (344 lb 2.2 oz) (!) 149.9 kg (330 lb 7.5 oz)    Exam  General: Well developed, well nourished, NAD, appears stated age  HEENT: NCAT, mucous membranes moist.   Neck: Supple  Cardiovascular: S1 S2 auscultated, no rubs, murmurs or gallops. Regular rate and rhythm.  Respiratory: Clear to auscultation bilaterally with equal chest rise  Abdomen: Soft, obese,  nontender, nondistended, + bowel sounds  Extremities: warm dry without cyanosis clubbing. LE edema B/L   Neuro: AAOx3, nonfocal  Psych: Appropriate   Data Reviewed: I have personally reviewed following labs and imaging studies  CBC: Recent Labs  Lab 05/01/17 0528 05/02/17 0750 05/03/17 0838 05/04/17 0540 05/05/17 0430  WBC 24.9* 27.0* 27.6* 33.3* 26.8*  NEUTROABS 21.0* 22.1* 25.1* 30.6* 23.0*  HGB 9.5* 9.8* 9.8* 9.7* 9.8*  HCT 28.2* 29.4* 29.2* 29.1* 29.2*  MCV 82.7 83.3 83.4 83.4 83.4  PLT 466* 503* 484* 539* 960*   Basic Metabolic Panel: Recent Labs  Lab 05/01/17 0528 05/02/17 0750 05/03/17 0838 05/04/17 0540 05/05/17 0430  NA 131*  131* 131* 129* 131* 133*  K 3.6  3.6 3.5 3.1* 3.5 3.1*  CL 94*  94* 95* 94* 94* 97*  CO2 22  22 22  21* 21* 21*  GLUCOSE 177*  183* 139* 207* 217* 138*  BUN 60*  61* 56* 60* 58* 50*  CREATININE 4.40*  4.40* 4.72* 5.38* 5.58* 5.42*  CALCIUM 8.6*  8.6* 8.3* 8.3* 8.5* 8.3*  MG 2.3 2.3 2.3 2.3 2.2  PHOS 7.1*  7.1* 5.8* 6.5* 6.9* 6.1*   GFR: Estimated Creatinine Clearance: 26.2 mL/min (A) (by C-G formula based on SCr of 5.42 mg/dL (H)). Liver Function Tests: Recent Labs  Lab 05/01/17 0528 05/02/17  5885 05/03/17 0838 05/04/17 0540 05/05/17 0430  AST 10* 11* 10* 12* 12*  ALT 7* <5* 7* 8* 7*  ALKPHOS 80 83 80 70 74  BILITOT 0.8 0.6 0.5 0.6 0.6  PROT 6.2* 6.2* 5.7* 6.1* 5.9*  ALBUMIN 2.0*  2.0* 2.2* 2.3* 2.3* 2.4*   No results for input(s): LIPASE, AMYLASE in the last 168 hours. No results for input(s): AMMONIA in the last 168 hours. Coagulation Profile: Recent Labs  Lab 05/04/17 0540  INR 1.34   Cardiac Enzymes: No results for input(s): CKTOTAL, CKMB, CKMBINDEX, TROPONINI in the last 168 hours. BNP (last 3 results) No results for input(s): PROBNP in the last 8760 hours. HbA1C: No results for input(s): HGBA1C in the last 72 hours. CBG: Recent Labs  Lab 05/04/17 1127 05/04/17 1522 05/04/17 1734  05/04/17 2100 05/05/17 0728  GLUCAP 170* 162* 161* 192* 136*   Lipid Profile: No results for input(s): CHOL, HDL, LDLCALC, TRIG, CHOLHDL, LDLDIRECT in the last 72 hours. Thyroid Function Tests: No results for input(s): TSH, T4TOTAL, FREET4, T3FREE, THYROIDAB in the last 72 hours. Anemia Panel: No results for input(s): VITAMINB12, FOLATE, FERRITIN, TIBC, IRON, RETICCTPCT in the last 72 hours. Urine analysis:    Component Value Date/Time   COLORURINE YELLOW 04/25/2017 2057   APPEARANCEUR CLEAR 04/25/2017 2057   LABSPEC 1.010 04/25/2017 2057   PHURINE 5.0 04/25/2017 2057   GLUCOSEU NEGATIVE 04/25/2017 2057   HGBUR NEGATIVE 04/25/2017 2057   BILIRUBINUR NEGATIVE 04/25/2017 2057   KETONESUR NEGATIVE 04/25/2017 2057   PROTEINUR NEGATIVE 04/25/2017 2057   NITRITE NEGATIVE 04/25/2017 2057   LEUKOCYTESUR NEGATIVE 04/25/2017 2057   Sepsis Labs: @LABRCNTIP (procalcitonin:4,lacticidven:4)  ) Recent Results (from the past 240 hour(s))  Culture, blood (routine x 2)     Status: None   Collection Time: 04/26/17 12:10 AM  Result Value Ref Range Status   Specimen Description BLOOD LEFT HAND  Final   Special Requests IN PEDIATRIC BOTTLE Blood Culture adequate volume  Final   Culture   Final    NO GROWTH 5 DAYS Performed at [facility], [place] [address] [place], [place] [address]    Report Status 05/01/2017 FINAL  Final  Culture, blood (routine x 2)     Status: None   Collection Time: 04/26/17 12:18 AM  Result Value Ref Range Status   Specimen Description BLOOD LEFT HAND  Final   Special Requests IN PEDIATRIC BOTTLE Blood Culture adequate volume  Final   Culture   Final    NO GROWTH 5 DAYS Performed at [facility], [place] [address] [place],  [address]    Report Status 05/01/2017 FINAL  Final  Gastrointestinal Panel by PCR , Stool     Status: None   Collection Time: 05/02/17  2:42 PM  Result Value Ref Range Status   Campylobacter species NOT DETECTED NOT DETECTED  Final   Plesimonas shigelloides NOT DETECTED NOT DETECTED Final   Salmonella species NOT DETECTED NOT DETECTED Final   Yersinia enterocolitica NOT DETECTED NOT DETECTED Final   Vibrio species NOT DETECTED NOT DETECTED Final   Vibrio cholerae NOT DETECTED NOT DETECTED Final   Enteroaggregative E coli (EAEC) NOT DETECTED NOT DETECTED Final   Enteropathogenic E coli (EPEC) NOT DETECTED NOT DETECTED Final   Enterotoxigenic E coli (ETEC) NOT DETECTED NOT DETECTED Final   Shiga like toxin producing E coli (STEC) NOT DETECTED NOT DETECTED Final   Shigella/Enteroinvasive E coli (EIEC) NOT DETECTED NOT DETECTED Final   Cryptosporidium NOT DETECTED  NOT DETECTED Final   Cyclospora cayetanensis NOT DETECTED NOT DETECTED Final   Entamoeba histolytica NOT DETECTED NOT DETECTED Final   Giardia lamblia NOT DETECTED NOT DETECTED Final   Adenovirus F40/41 NOT DETECTED NOT DETECTED Final   Astrovirus NOT DETECTED NOT DETECTED Final   Norovirus GI/GII NOT DETECTED NOT DETECTED Final   Rotavirus A NOT DETECTED NOT DETECTED Final   Sapovirus (I, II, IV, and V) NOT DETECTED NOT DETECTED Final    Comment: Performed at [facility], [address] [place], [place] [address]  C Bailey quick scan w PCR reflex     Status: None   Collection Time: 05/02/17  2:42 PM  Result Value Ref Range Status   C Diff antigen NEGATIVE NEGATIVE Final   C Diff toxin NEGATIVE NEGATIVE Final   C Diff interpretation No John Bailey detected.  Final    Comment: Performed at [facility], [place] [address], [place], [place] [address]  Surgical pcr screen     Status: None   Collection Time: 05/04/17  6:53 AM  Result Value Ref Range Status   MRSA, PCR NEGATIVE NEGATIVE Final   Staphylococcus aureus NEGATIVE NEGATIVE Final    Comment: (NOTE) The Xpert SA Assay (FDA approved for NASAL specimens in patients 33 years of age and older), is one component of a comprehensive surveillance program. It is not intended to  diagnose infection nor to guide or monitor treatment. Performed at [facility], [place] [address], [place], [place] [address]       Radiology Studies: Dg Chest Port 1 View  Result Date: 05/04/2017 CLINICAL DATA:  Dialysis catheter placement. EXAM: PORTABLE CHEST 1 VIEW COMPARISON:  04/29/2017. FINDINGS: Right IJ dual-lumen catheter noted with tip at the cavoatrial junction. Cardiomegaly with mild pulmonary vascular prominence. No focal infiltrate. No pleural effusion or pneumothorax. IMPRESSION: 1. Right IJ dual-lumen catheter noted with tip at the cavoatrial junction. 2.  Cardiomegaly with mild pulmonary venous congestion. Electronically Signed   By: Marcello Moores  Register   On: 05/04/2017 16:23   Dg Fluoro Guide Cv Line-no Report  Result Date: 05/04/2017 Fluoroscopy was utilized by the requesting physician.  No radiographic interpretation.   Vas [place] Upper Extremity Arterial Duplex  Result Date: 05/03/2017 UPPER EXTREMITY DUPLEX STUDY Indications: Pre-op for AVF. Examination Guidelines: A complete evaluation includes B-mode imaging, spectral doppler, color doppler, and power doppler as needed of all accessible portions of each vessel. Bilateral testing is considered an integral part of a complete examination. Limited examinations for reoccurring indications may be performed as noted.  Right Pre-Dialysis Findings: +-----------------+----------+------------------+---------+--------------------+ Location         PSV (cm/s)Intralum. Diam.   Waveform Comments                                        (mm)                                            +-----------------+----------+------------------+---------+--------------------+ Brachial Antecub.50                          triphasicproximal upper arm   fossa                                                                      +-----------------+----------+------------------+---------+--------------------+  Radial Art at    51         2.60              biphasic high bifurcation in  Wrist                                                 the upper arm        +-----------------+----------+------------------+---------+--------------------+ Ulnar Art at     44        3.00              biphasic high bifurcation in  Wrist                                                 the upper arm        +-----------------+----------+------------------+---------+--------------------+ Left Pre-Dialysis Findings: +-----------------------+----------+--------------------+---------+--------+ Location               PSV (cm/s)Intralum. Diam. (mm)Waveform Comments +-----------------------+----------+--------------------+---------+--------+ Brachial Antecub. fossa47        6.20                triphasic         +-----------------------+----------+--------------------+---------+--------+ Radial Art at Wrist    67        3.20                                  +-----------------------+----------+--------------------+---------+--------+ Ulnar Art at Wrist     73        4.70                triphasic         +-----------------------+----------+--------------------+---------+--------+ Final Interpretation:  Right: No obstruction visualized in the right upper extremity. Left: No obstruction visualized in the left upper extremity. *See table(s) above for measurements and observations. Electronically signed by Curt Jews on 05/03/2017 at 7:11:24 PM.   Vas [place] Upper Ext Vein Mapping (pre-op Avf)  Result Date: 05/03/2017 UPPER EXTREMITY VEIN MAPPING History: Pre-access. Examination Guidelines: A complete evaluation includes B-mode imaging, spectral doppler, color doppler, and power doppler as needed of all accessible portions of each vessel. Bilateral testing is considered an integral part of a complete examination. Limited examinations for reoccurring indications may be performed as noted. +-----------------+-------------+----------+---------+  Right Cephalic   Diameter (mm)Depth (mm)Findings  +-----------------+-------------+----------+---------+ Shoulder             3.80        6.30             +-----------------+-------------+----------+---------+ Prox upper arm       3.10       11.80             +-----------------+-------------+----------+---------+ Mid upper arm                           bandages  +-----------------+-------------+----------+---------+ Dist upper arm       2.40        4.60             +-----------------+-------------+----------+---------+ Antecubital fossa    1.70        9.40             +-----------------+-------------+----------+---------+  Prox forearm         3.80        9.60             +-----------------+-------------+----------+---------+ Mid forearm          2.90        9.40   branching +-----------------+-------------+----------+---------+ Dist forearm         3.20        2.80             +-----------------+-------------+----------+---------+ Wrist                1.90        2.70             +-----------------+-------------+----------+---------+ +-----------------+-------------+----------+--------------+ Right Basilic    Diameter (mm)Depth (mm)   Findings    +-----------------+-------------+----------+--------------+ Shoulder                                not visualized +-----------------+-------------+----------+--------------+ Prox upper arm                          not visualized +-----------------+-------------+----------+--------------+ Mid upper arm                           not visualized +-----------------+-------------+----------+--------------+ Dist upper arm                          not visualized +-----------------+-------------+----------+--------------+ Antecubital fossa                       not visualized +-----------------+-------------+----------+--------------+ Prox forearm         2.10                               +-----------------+-------------+----------+--------------+ Mid forearm          1.50                              +-----------------+-------------+----------+--------------+ Distal forearm       1.60                              +-----------------+-------------+----------+--------------+ Wrist                1.40                              +-----------------+-------------+----------+--------------+ +-----------------+-------------+----------+---------+ Left Cephalic    Diameter (mm)Depth (mm)Findings  +-----------------+-------------+----------+---------+ Shoulder             3.10       20.00             +-----------------+-------------+----------+---------+ Prox upper arm       3.60       13.50             +-----------------+-------------+----------+---------+ Mid upper arm        2.90       10.00             +-----------------+-------------+----------+---------+ Dist upper arm       2.70       10.00             +-----------------+-------------+----------+---------+  Antecubital fossa    3.70        3.00             +-----------------+-------------+----------+---------+ Prox forearm         2.80        8.50   branching +-----------------+-------------+----------+---------+ Mid forearm          2.90        4.90             +-----------------+-------------+----------+---------+ Dist forearm         2.80        4.20             +-----------------+-------------+----------+---------+ Wrist                3.50        4.10             +-----------------+-------------+----------+---------+ +-----------------+-------------+----------+--------------+ Left Basilic     Diameter (mm)Depth (mm)   Findings    +-----------------+-------------+----------+--------------+ Shoulder                                not visualized +-----------------+-------------+----------+--------------+ Prox upper arm                          not visualized  +-----------------+-------------+----------+--------------+ Mid upper arm                           not visualized +-----------------+-------------+----------+--------------+ Dist upper arm                          not visualized +-----------------+-------------+----------+--------------+ Antecubital fossa    3.40                              +-----------------+-------------+----------+--------------+ Prox forearm         4.00                              +-----------------+-------------+----------+--------------+ Mid forearm          1.40                              +-----------------+-------------+----------+--------------+ Distal forearm                          not visualized +-----------------+-------------+----------+--------------+ Elbow                                   not visualized +-----------------+-------------+----------+--------------+ Wrist                                   not visualized +-----------------+-------------+----------+--------------+ Final Interpretation: *See table(s) above for measurements and observations.  Curt Jews Electronically signed by Curt Jews on 05/03/2017 at 7:10:36 PM.      Scheduled Meds: . amiodarone  400 mg Oral BID  . apixaban  5 mg Oral BID  . carvedilol  18.75 mg Oral BID WC  . colchicine  0.3 mg Oral Daily  . fluticasone  2 spray Each Nare Daily  . gabapentin  300  mg Oral Daily  . insulin aspart  0-15 Units Subcutaneous TID WC  . insulin aspart  0-5 Units Subcutaneous QHS  . insulin glargine  25 Units Subcutaneous QHS  . predniSONE  50 mg Oral Q breakfast  . sodium chloride flush  3 mL Intravenous Q12H  . sodium chloride flush  3 mL Intravenous Q12H   Continuous Infusions: . sodium chloride    . sodium chloride    . sodium chloride 10 mL/hr at 05/04/17 1220     LOS: 13 days   Time Spent in minutes   30 minutes  Kensli Bowley D.O. on 05/05/2017 at 4:26 PM  Between 7am to 7pm - Pager -  [phone]  After 7pm go to [email or web address] - password [email or web address]  And look for the night coverage person covering for me after hours  [facility] Office  [phone]

## 2017-05-06 DIAGNOSIS — M069 Rheumatoid arthritis, unspecified: Secondary | ICD-10-CM

## 2017-05-06 DIAGNOSIS — R5381 Other malaise: Secondary | ICD-10-CM

## 2017-05-06 DIAGNOSIS — N179 Acute kidney failure, unspecified: Secondary | ICD-10-CM

## 2017-05-06 DIAGNOSIS — I130 Hypertensive heart and chronic kidney disease with heart failure and stage 1 through stage 4 chronic kidney disease, or unspecified chronic kidney disease: Secondary | ICD-10-CM

## 2017-05-06 LAB — CBC
HCT: 30.6 % — ABNORMAL LOW (ref 39.0–52.0)
HEMOGLOBIN: 10.2 g/dL — AB (ref 13.0–17.0)
MCH: 28 pg (ref 26.0–34.0)
MCHC: 33.3 g/dL (ref 30.0–36.0)
MCV: 84.1 fL (ref 78.0–100.0)
Platelets: 545 10*3/uL — ABNORMAL HIGH (ref 150–400)
RBC: 3.64 MIL/uL — ABNORMAL LOW (ref 4.22–5.81)
RDW: 18.5 % — AB (ref 11.5–15.5)
WBC: 33.1 10*3/uL — ABNORMAL HIGH (ref 4.0–10.5)

## 2017-05-06 LAB — GLUCOSE, CAPILLARY
GLUCOSE-CAPILLARY: 167 mg/dL — AB (ref 65–99)
GLUCOSE-CAPILLARY: 107 mg/dL — AB (ref 65–99)
GLUCOSE-CAPILLARY: 104 mg/dL — AB (ref 65–99)

## 2017-05-06 LAB — BASIC METABOLIC PANEL
Anion gap: 15 (ref 5–15)
BUN: 37 mg/dL — AB (ref 6–20)
CALCIUM: 8.4 mg/dL — AB (ref 8.9–10.3)
CHLORIDE: 97 mmol/L — AB (ref 101–111)
CO2: 21 mmol/L — AB (ref 22–32)
CREATININE: 4.84 mg/dL — AB (ref 0.61–1.24)
GFR calc Af Amer: 15 mL/min — ABNORMAL LOW (ref >60)
GFR calc non Af Amer: 13 mL/min — ABNORMAL LOW (ref >60)
Glucose, Bld: 183 mg/dL — ABNORMAL HIGH (ref 65–99)
Potassium: 3.1 mmol/L — ABNORMAL LOW (ref 3.5–5.1)
SODIUM: 133 mmol/L — AB (ref 135–145)

## 2017-05-06 MED ORDER — HEPARIN (PORCINE) IN NACL 100-0.45 UNIT/ML-% IJ SOLN
1950.0000 [IU]/h | INTRAMUSCULAR | Status: DC
  Administered 2017-05-06: 2350 [IU]/h via INTRAVENOUS
  Administered 2017-05-07: 1900 [IU]/h via INTRAVENOUS
  Administered 2017-05-07: 2100 [IU]/h via INTRAVENOUS
  Administered 2017-05-08 (×2): 1900 [IU]/h via INTRAVENOUS
  Administered 2017-05-09: 1950 [IU]/h via INTRAVENOUS
  Filled 2017-05-06 (×10): qty 250

## 2017-05-06 MED ORDER — SODIUM CHLORIDE 0.9 % IV BOLUS (SEPSIS)
250.0000 mL | Freq: Once | INTRAVENOUS | Status: AC
  Administered 2017-05-07: 250 mL via INTRAVENOUS

## 2017-05-06 MED ORDER — HYOSCYAMINE SULFATE 0.125 MG SL SUBL
0.2500 mg | SUBLINGUAL_TABLET | Freq: Once | SUBLINGUAL | Status: AC
  Administered 2017-05-06: 0.25 mg via SUBLINGUAL
  Filled 2017-05-06: qty 2

## 2017-05-06 MED ORDER — PREDNISONE 20 MG PO TABS
40.0000 mg | ORAL_TABLET | Freq: Every day | ORAL | Status: DC
  Administered 2017-05-07: 40 mg via ORAL
  Filled 2017-05-06: qty 2

## 2017-05-06 MED ORDER — HYOSCYAMINE SULFATE 0.125 MG/ML PO SOLN
0.2500 mg | Freq: Once | ORAL | Status: DC
  Filled 2017-05-06: qty 2

## 2017-05-06 NOTE — Consult Note (Signed)
Physical Medicine and Rehabilitation Consult Reason for Consult: Decreased functional mobility Referring Physician: Family medicine   HPI: John Bailey is a 47 y.o. right handed male with history of rheumatoid arthritis maintained on chronic prednisone, peripheral vascular disease with lower extremity ischemic changes followed at the wound care center, diabetes mellitus, chronic fluid overload, atrial flutter with cardioversion maintained on Xarelto. Per chart review patient lives with spouse. Wife works during the day. One level home with a basement. He goes upstairs each night with 8-10 steps. Patient used a cane prior to admission. He needed assistance from sit to stand from lift chair. Presented 04/22/2017 with increasing weight gain as well as shortness of breath and difficulty with ambulation. His oxygen saturations were in the 80s with ambulation. Noted Lasix recently adjusted. BUN 51, creatinine 2.65, troponin negative. Chest x-ray showed interstitial pulmonary edema. Renal ultrasound no hydronephrosis. Echocardiogram with ejection fraction of 60% with severe LVH. Patient placed on intravenous Lasix. Renal services consulted for elevating creatinine with dialysis initiated and underwent placement of right IJ dialysis tunneled catheter. Cardiology services consulted from by atrial fibrillation and underwent cardioversion 04/30/2017 currently with Xarelto on hold and continues with intravenous heparin with transition to Eliquis. Physical and occupational therapy evaluations have continue to follow. Occupational therapy evaluation 05/05/2017 with recommendations of CIR.  Patient had a 2-week decline in his level of energy.  Started sleeping all day.  He was going to wound care for his chronic lower extremity swelling and had Unna boots.  These were removed today Dry weight has been estimated at 280 pounds current weight is 320 pounds  Review of Systems  Constitutional: Negative for  chills and fever.  HENT: Negative for hearing loss.   Eyes: Negative for blurred vision and double vision.  Respiratory: Positive for shortness of breath.   Cardiovascular: Positive for palpitations and leg swelling. Negative for chest pain.  Gastrointestinal: Positive for constipation. Negative for nausea and vomiting.  Genitourinary: Negative for flank pain and hematuria.  Musculoskeletal: Positive for joint pain and myalgias.  Skin: Negative for rash.  Neurological: Negative for seizures.  All other systems reviewed and are negative.  Past Medical History:  Diagnosis Date  . Atrial flutter with rapid ventricular response ([place]) 08/06/2015   a. s/p DCCV in 07/2015 with recurrent atrial fibrillation and DCCV in 12/2016. Initially successful but noted to be back in atrial fibrillation within 2 weeks of DCCV.   [name] Chronic diastolic (congestive) heart failure (HCC)    a. EF 50-55% by echo in 06/2016.  . Diabetes ([place])   . Hypertension    Past Surgical History:  Procedure Laterality Date  . CARDIOVERSION N/A 08/01/2015   Procedure: CARDIOVERSION;  Surgeon: Josue Hector, MD;  Location: [facility] ENDOSCOPY;  Service: Cardiovascular;  Laterality: N/A;  . CARDIOVERSION N/A 12/18/2016   Procedure: CARDIOVERSION;  Surgeon: [name], MD;  Location: [facility] ENDOSCOPY;  Service: Cardiovascular;  Laterality: N/A;  . CARDIOVERSION N/A 04/30/2017   Procedure: CARDIOVERSION;  Surgeon: Larey Dresser, MD;  Location: [facility] ENDOSCOPY;  Service: Cardiovascular;  Laterality: N/A;  . CHOLECYSTECTOMY    . INSERTION OF DIALYSIS CATHETER Right 05/04/2017   Procedure: INSERTION OF TUNNELED  DIALYSIS CATHETER;  Surgeon: Conrad Millhousen, MD;  Location: [facility];  Service: Vascular;  Laterality: Right;  . IR FLUORO GUIDE CV LINE RIGHT  04/27/2017  . IR US GUIDE VASC ACCESS RIGHT  04/27/2017  . TEE WITHOUT CARDIOVERSION N/A 08/01/2015   Procedure: TRANSESOPHAGEAL ECHOCARDIOGRAM (  TEE);  Surgeon: Josue Hector, MD;  Location:  [facility] ENDOSCOPY;  Service: Cardiovascular;  Laterality: N/A;   Family History  Problem Relation Age of Onset  . Heart disease Mother   . Hyperlipidemia Father   . Hypertension Father    Social History:  reports that  has never smoked. he has never used smokeless tobacco. He reports that he does not drink alcohol or use drugs. Allergies: No Known Allergies Medications Prior to Admission  Medication Sig Dispense Refill  . Abatacept 125 MG/ML SOAJ Inject 125 mg into the skin every 7 (seven) days. On Tuesday    . acetaminophen (TYLENOL) 325 MG tablet Take 650 mg by mouth every 6 (six) hours as needed for mild pain.    [name] albuterol (PROVENTIL HFA;VENTOLIN HFA) 108 (90 BASE) MCG/ACT inhaler Inhale 1-2 puffs into the lungs every 6 (six) hours as needed for wheezing. 1 Inhaler 3  . carvedilol (COREG) 12.5 MG tablet Take 1 tablet (12.5 mg total) by mouth 2 (two) times daily with a meal. 2 tablet 0  . celecoxib (CELEBREX) 100 MG capsule Take 100 mg by mouth daily.  0  . colchicine 0.6 MG tablet Take 0.5 tablets (0.3 mg total) by mouth daily. 30 tablet 0  . diltiazem (CARDIZEM CD) 180 MG 24 hr capsule Take 1 capsule (180 mg total) by mouth daily. 30 capsule 0  . fluticasone (FLONASE) 50 MCG/ACT nasal spray Place 2 sprays into the nose daily.     . furosemide (LASIX) 40 MG tablet Take 40 mg by mouth 2 (two) times daily.  3  . gabapentin (NEURONTIN) 300 MG capsule Take 1 capsule (300 mg total) by mouth 2 (two) times daily. (Patient taking differently: Take 300 mg by mouth 3 (three) times daily. ) 60 capsule 0  . insulin aspart (NOVOLOG FLEXPEN) 100 UNIT/ML FlexPen Inject 0-12 Units into the skin 3 (three) times daily as needed for high blood sugar. Sliding Scale <70, initiate hypoglycemia protocol, 70-100=0 units, 110-199= 3 units, 200-250=5 units, 251-300=7 units, 301-350=10 units, 351-400=12 units, >400=14 units. Call MD if >450 15 mL 11  . Insulin Glargine (LANTUS) 100 UNIT/ML Solostar Pen Inject 25 Units  into the skin daily at 10 pm. 15 mL 11  . potassium chloride SA (KLOR-CON M20) 20 MEQ tablet Take 1 tablet (20 mEq total) by mouth daily. 90 tablet 3  . rivaroxaban (XARELTO) 20 MG TABS tablet take 1 tablet by mouth once daily WITH SUPPER (Patient taking differently: Take 20 mg by mouth daily with supper. ) 30 tablet 0  . Insulin Pen Needle 31G X 6 MM MISC 1 Device by Does not apply route 2 (two) times daily. 69 each 3    Home: Home Living Family/patient expects to be discharged to:: Private residence Living Arrangements: Spouse/significant other Available Help at Discharge: Family Type of Home: House Home Access: Level entry Home Layout: (stays in basement downstairs, goes upstairs each night) Alternate Level Stairs-Number of Steps: 2 flights (8-10 each) Alternate Level Stairs-Rails: Left Bathroom Shower/Tub: Multimedia programmer: Standard Home Equipment: Cane - single point  Functional History: Prior Function Level of Independence: Needs assistance Gait / Transfers Assistance Needed: Required assistance for standing from lift chair as it is not functioning properly ADL's / Homemaking Assistance Needed: Required assist to rise out of his lift chair due to chair breaking. Walks up his stairs each night Functional Status:  Mobility: Bed Mobility Overal bed mobility: Needs Assistance Bed Mobility: Supine to Sit, Sit to Supine Rolling:  Mod assist, +2 for safety/equipment Supine to sit: Max assist, +2 for physical assistance Sit to supine: Max assist, +2 for physical assistance(+3) General bed mobility comments: Pt requiring significant assistance for bed mobility but able to initiate tasks.  Transfers Overall transfer level: Needs assistance Transfer via Lift Equipment: [place] transfer comment: Discussed plan to attempt standing next session. Ambulation/Gait Ambulation/Gait assistance: (NT)    ADL: ADL Overall ADL's : Needs  assistance/impaired Eating/Feeding: Moderate assistance, Sitting Grooming: Maximal assistance, Sitting Upper Body Bathing: Maximal assistance, Sitting Lower Body Bathing: Total assistance, Sitting/lateral leans Upper Body Dressing : Maximal assistance, Sitting Lower Body Dressing: Total assistance, Sitting/lateral leans Toileting- Clothing Manipulation and Hygiene: Total assistance, Bed level General ADL Comments: Pt able to sit at EOB for strengthening activities in preparation for ADL. Able to reach with RUE to back of head.  Cognition: Cognition Overall Cognitive Status: Within Functional Limits for tasks assessed Orientation Level: Oriented X4 Cognition Arousal/Alertness: Awake/alert Behavior During Therapy: WFL for tasks assessed/performed Overall Cognitive Status: Within Functional Limits for tasks assessed General Comments: at times slow to respond but appears [facility]  Blood pressure (!) 147/89, pulse 69, temperature 97.8 F (36.6 C), temperature source Oral, resp. rate 20, height 6' (1.829 m), weight (!) 150.1 kg (330 lb 14.6 oz), SpO2 100 %. Physical Exam  Vitals reviewed. Constitutional: He is oriented to person, place, and time.  47 year old right-handed obese male  HENT:  Head: Normocephalic.  Eyes: EOM are normal.  Neck: Normal range of motion. Neck supple. No thyromegaly present.  Cardiovascular:  Cardiac rate controlled  Respiratory: Effort normal and breath sounds normal. No respiratory distress.  GI: Soft. Bowel sounds are normal. He exhibits no distension.  Neurological: He is alert and oriented to person, place, and time.  Skin:  Unna boots in place  Motor strength is 3- bilateral grip secondary to pain and joint stiffness.  Biceps triceps are 4- right deltoid 4 8 left deltoid 3- Lower extremity 3- hip flexion knee extension and ankle dorsiflexion bilaterally. 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 Results for orders placed or performed during the hospital encounter of 04/22/17 (from the past 24 hour(s))  Glucose, capillary     Status: Abnormal   Collection Time: 05/05/17  4:24 PM  Result Value Ref Range   Glucose-Capillary 145 (H) 65 - 99 mg/dL  Renal function panel     Status: Abnormal   Collection Time: 05/05/17  5:01 PM  Result Value Ref Range   Sodium 134 (L) 135 - 145 mmol/L   Potassium 3.4 (L) 3.5 - 5.1 mmol/L   Chloride 98 (L) 101 - 111 mmol/L   CO2 22 22 - 32 mmol/L   Glucose, Bld 154 (H) 65 - 99 mg/dL   BUN 31 (H) 6 - 20 mg/dL   Creatinine, Ser 4.09 (H) 0.61 - 1.24 mg/dL   Calcium 8.2 (L) 8.9 - 10.3 mg/dL   Phosphorus 3.6 2.5 - 4.6 mg/dL   Albumin 2.5 (L) 3.5 - 5.0 g/dL   GFR calc non Af Amer 16 (L) >60 mL/min   GFR calc Af Amer 19 (L) >60 mL/min   Anion gap 14 5 - 15  Glucose, capillary     Status: Abnormal   Collection Time: 05/05/17  9:46 PM  Result Value  Ref Range   Glucose-Capillary 238 (H) 65 - 99 mg/dL  Glucose, capillary     Status: Abnormal   Collection Time: 05/06/17  7:44 AM  Result Value Ref Range   Glucose-Capillary 167 (H) 65 - 99 mg/dL  CBC     Status: Abnormal   Collection Time: 05/06/17  7:45 AM  Result Value Ref Range   WBC 33.1 (H) 4.0 - 10.5 K/uL   RBC 3.64 (L) 4.22 - 5.81 MIL/uL   Hemoglobin 10.2 (L) 13.0 - 17.0 g/dL   HCT 30.6 (L) 39.0 - 52.0 %   MCV 84.1 78.0 - 100.0 fL   MCH 28.0 26.0 - 34.0 pg   MCHC 33.3 30.0 - 36.0 g/dL   RDW 18.5 (H) 11.5 - 15.5 %   Platelets 545 (H) 150 - 400 K/uL  Basic metabolic panel     Status: Abnormal   Collection Time: 05/06/17  7:45 AM  Result Value Ref Range   Sodium 133 (L) 135 - 145 mmol/L   Potassium 3.1 (L) 3.5 - 5.1 mmol/L   Chloride 97 (L) 101 - 111 mmol/L   CO2 21 (L) 22 - 32 mmol/L   Glucose, Bld 183 (H) 65 - 99 mg/dL    BUN 37 (H) 6 - 20 mg/dL   Creatinine, Ser 4.84 (H) 0.61 - 1.24 mg/dL   Calcium 8.4 (L) 8.9 - 10.3 mg/dL   GFR calc non Af Amer 13 (L) >60 mL/min   GFR calc Af Amer 15 (L) >60 mL/min   Anion gap 15 5 - 15   Dg Chest Port 1 View  Result Date: 05/04/2017 CLINICAL DATA:  Dialysis catheter placement. EXAM: PORTABLE CHEST 1 VIEW COMPARISON:  04/29/2017. FINDINGS: Right IJ dual-lumen catheter noted with tip at the cavoatrial junction. Cardiomegaly with mild pulmonary vascular prominence. No focal infiltrate. No pleural effusion or pneumothorax. IMPRESSION: 1. Right IJ dual-lumen catheter noted with tip at the cavoatrial junction. 2.  Cardiomegaly with mild pulmonary venous congestion. Electronically Signed   By: Marcello Moores  Register   On: 05/04/2017 16:23   Dg Fluoro Guide Cv Line-no Report  Result Date: 05/04/2017 Fluoroscopy was utilized by the requesting physician.  No radiographic interpretation.    Assessment/Plan: Diagnosis: Deconditioning secondary to cardiorenal syndrome 1. Does the need for close, 24 hr/day medical supervision in concert with the patient's rehab needs make it unreasonable for this patient to be served in a less intensive setting? Yes 2. Co-Morbidities requiring supervision/potential complications: Stasis dermatitis, morbid obesity BMI greater than 40, rheumatoid arthritis with multiple joint deformities 3. Due to bladder management, bowel management, safety, skin/wound care, disease management, medication administration, pain management and patient education, does the patient require 24 hr/day rehab nursing? Yes 4. Does the patient require coordinated care of a physician, rehab nurse, PT (1-2 hrs/day, 5 days/week) and OT (1-2 hrs/day, 5 days/week) to address physical and functional deficits in the context of the above medical diagnosis(es)? Yes Addressing deficits in the following areas: balance, endurance, locomotion, strength, transferring, bathing, dressing, feeding, grooming,  toileting and psychosocial support 5. Can the patient actively participate in an intensive therapy program of at least 3 hrs of therapy per day at least 5 days per week? Not currently but should be able to do so in 4-5 days 6. The potential for patient to make measurable gains while on inpatient rehab is good 7. Anticipated functional outcomes upon discharge from inpatient rehab are supervision and min assist  with PT, supervision and min assist with OT, n/a  with SLP. 8. Estimated rehab length of stay to reach the above functional goals is: 20-24 days 9. Anticipated D/C setting: Home 10. Anticipated post D/C treatments: [place] therapy 11. Overall Rehab/Functional Prognosis: good and fair  RECOMMENDATIONS: This patient's condition is appropriate for continued rehabilitative care in the following setting: CIR Patient has agreed to participate in recommended program. Yes Note that insurance prior authorization may be required for reimbursement for recommended care.  Comment: Currently not able to tolerate more intensive therapy.  Discussed with physical therapy should be ready early next week.  We discussed the importance of weight loss with the patient in terms of his long-term mobility issues.   Lavon Paganini Angiulli, PA-C 05/06/2017

## 2017-05-06 NOTE — Progress Notes (Addendum)
Patient ID: John Bailey, male   DOB: 1971-02-09, 47 y.o.   MRN: [id number] P    Advanced Heart Failure Rounding Note  PCP-Cardiologist: Pixie Casino, MD  HF Cardiologist: [name], MD  Subjective:    DCCV on 2/15, he remains in NSR. Now on PO amio and eliquis  Tunneled HD cath placed 2/19  Echo (04/25/17): EF 55-60%, severe LVH.   HD yesterday with another 6 L off. Weight unchanged. Labs pending.  No CP, SOB, or dizziness. Sat on side of bed with PT yesterday. Wants a break from [place] boots - Spoke with RN. WOCN to come by today.  Objective:   Weight Range: (!) 330 lb 14.6 oz (150.1 kg) Body mass index is 44.88 kg/m.   Vital Signs:   Temp:  [97.4 F (36.3 C)-100.7 F (38.2 C)] 97.4 F (36.3 C) (02/21 0312) Pulse Rate:  [70-88] 70 (02/21 0312) Resp:  [18-30] 21 (02/21 0312) BP: (101-120)/(53-83) 111/69 (02/21 0312) SpO2:  [89 %-97 %] 96 % (02/21 0312) Weight:  [330 lb 7.5 oz (149.9 kg)-344 lb 2.2 oz (156.1 kg)] 330 lb 14.6 oz (150.1 kg) (02/21 0312) Last BM Date: 05/05/17  Weight change: Filed Weights   05/05/17 0800 05/05/17 1220 05/06/17 0312  Weight: (!) 344 lb 2.2 oz (156.1 kg) (!) 330 lb 7.5 oz (149.9 kg) (!) 330 lb 14.6 oz (150.1 kg)   Intake/Output:   Intake/Output Summary (Last 24 hours) at 05/06/2017 0740 Last data filed at 05/06/2017 0400 Gross per 24 hour  Intake 1020.99 ml  Output 6001 ml  Net -4980.01 ml     Physical Exam  General: Obese. No resp difficulty. HEENT: Normal Neck: Supple. JVP to ear Carotids 2+ bilat; no bruits. No thyromegaly or nodule noted. Cor: PMI nondisplaced. RRR, No M/G/R noted. Distant heart sounds Lungs: diminished throughout Abdomen: obese, non-tender, + distension, no HSM. No bruits or masses. +BS  Extremities: No cyanosis, clubbing, or rash. R and LLE +2 edema to knees. Right IJ tunneled HD cath CDI, mepilex on RUE, unna boots BLE Neuro: Alert & orientedx3, cranial nerves grossly intact. moves all 4  extremities w/o difficulty. Affect pleasant   Telemetry  NSR 60-70s with 1st degree AV block and IVCD. Personally reviewed.  Labs    CBC Recent Labs    05/04/17 0540 05/05/17 0430  WBC 33.3* 26.8*  NEUTROABS 30.6* 23.0*  HGB 9.7* 9.8*  HCT 29.1* 29.2*  MCV 83.4 83.4  PLT 539* 462*   Basic Metabolic Panel Recent Labs    05/04/17 0540 05/05/17 0430 05/05/17 1701  NA 131* 133* 134*  K 3.5 3.1* 3.4*  CL 94* 97* 98*  CO2 21* 21* 22  GLUCOSE 217* 138* 154*  BUN 58* 50* 31*  CREATININE 5.58* 5.42* 4.09*  CALCIUM 8.5* 8.3* 8.2*  MG 2.3 2.2  --   PHOS 6.9* 6.1* 3.6   Liver Function Tests Recent Labs    05/04/17 0540 05/05/17 0430 05/05/17 1701  AST 12* 12*  --   ALT 8* 7*  --   ALKPHOS 70 74  --   BILITOT 0.6 0.6  --   PROT 6.1* 5.9*  --   ALBUMIN 2.3* 2.4* 2.5*   No results for input(s): LIPASE, AMYLASE in the last 72 hours. Cardiac Enzymes No results for input(s): CKTOTAL, CKMB, CKMBINDEX, TROPONINI in the last 72 hours.  BNP: BNP (last 3 results) Recent Labs    03/06/17 1800 04/22/17 1734 04/23/17 0736  BNP 791.5* 531.3* 627.9*  ProBNP (last 3 results) No results for input(s): PROBNP in the last 8760 hours.   D-Dimer No results for input(s): DDIMER in the last 72 hours. Hemoglobin A1C No results for input(s): HGBA1C in the last 72 hours. Fasting Lipid Panel No results for input(s): CHOL, HDL, LDLCALC, TRIG, CHOLHDL, LDLDIRECT in the last 72 hours. Thyroid Function Tests No results for input(s): TSH, T4TOTAL, T3FREE, THYROIDAB in the last 72 hours.  Invalid input(s): FREET3  Other results:   Imaging       Medications:     Scheduled Medications: . amiodarone  400 mg Oral BID  . apixaban  5 mg Oral BID  . carvedilol  18.75 mg Oral BID WC  . colchicine  0.3 mg Oral Daily  . fluticasone  2 spray Each Nare Daily  . gabapentin  300 mg Oral Daily  . insulin aspart  0-15 Units Subcutaneous TID WC  . insulin aspart  0-5 Units  Subcutaneous QHS  . insulin glargine  25 Units Subcutaneous QHS  . predniSONE  50 mg Oral Q breakfast  . sodium chloride flush  3 mL Intravenous Q12H  . sodium chloride flush  3 mL Intravenous Q12H    Infusions: . sodium chloride    . sodium chloride    . sodium chloride 10 mL/hr at 05/04/17 1220    PRN Medications: sodium chloride, acetaminophen, methocarbamol, ondansetron (ZOFRAN) IV, sodium chloride flush, sodium chloride flush, sodium chloride flush    Assessment/Plan   1. Atrial fibrillation with RVR: On review of prior ECGs, last time he was documented to be in NSR was in 1/18. I suspect that he may be better compensated in NSR. His HR has been difficult to control.  He remains in NSR after DCCV on 2/15.   - Continue PO amio 400 mg BID - Continue Coreg 18.75 mg BID - On Eliquis 5 mg BID => will need permanent HD access placed so hold and use heparin gtt 2. Acute on chronic diastolic CHF: Echo this admission with severe LVH, EF 55-60%. Marked volume overload with anasarca. Fluid now coming off with HD but he has a long way to go.  - Continue daily HD per renal.  No change. - With low voltage and severe LVH, sent SPEP which did not show an M-spike.  3. AKI on CKD Stage IV: I suspect that he will be dialysis dependent for volume management. Continue daily HD. Now has tunneled HD cath. No change. 4. Fever: Afebrile today though WBCs still high.  Cultures negative.  Suspicion for RA flare, high ESR and CRP.  He has been started on prednisone for management.  Knee pain improved.  Remains afebrile. WBC trending back down 33 > 26. CBC pending this am. 5. HTN: Improved with volume removal. SBP 110-140's 6. Knee pain: Likely related to rheumatoid arthritis. Knee Xray with subQ edema. No acute osseous findings. Improved. 7. Deconditioning - PT/OT recommending SNF vs CIR  Length of Stay: [place], NP  05/06/2017, 7:40 AM  Advanced Heart Failure Team Pager [phone] (M-F; [place])  Please contact [facility] for night-coverage after hours (4p -7a ) and weekends on [email or web address]  Patient seen with NP, agree with the above note.  He remains in NSR.  Ongoing HD with fluid removal.  He will likely need long-term HD.  Continue daily HD for the time being as he still has volume excess.   When he is finished with procedures, would transition from heparin gtt  to apixaban 5 mg bid (based on latest guidelines for anticoagulation for atrial fibrillation in setting of ESRD).  He will need permanent HD access placed by vascular, so not ready to start apixaban yet.   Tapering prednisone for RA flare.   [name] 05/06/2017

## 2017-05-06 NOTE — Procedures (Signed)
I was present at this dialysis session. I have reviewed the session itself and made appropriate changes.   AM weight 330lb, improving.    4K bath. Qb 400, UF goal 6L.  Achieved 6L yesterday.  K is in low 3s, monitor for now, on 4K bath.  Filed Weights   05/05/17 0800 05/05/17 1220 05/06/17 0312  Weight: (!) 156.1 kg (344 lb 2.2 oz) (!) 149.9 kg (330 lb 7.5 oz) (!) 150.1 kg (330 lb 14.6 oz)    Recent Labs  Lab 05/05/17 1701 05/06/17 0745  NA 134* 133*  K 3.4* 3.1*  CL 98* 97*  CO2 22 21*  GLUCOSE 154* 183*  BUN 31* 37*  CREATININE 4.09* 4.84*  CALCIUM 8.2* 8.4*  PHOS 3.6  --     Recent Labs  Lab 05/03/17 0838 05/04/17 0540 05/05/17 0430 05/06/17 0745  WBC 27.6* 33.3* 26.8* 33.1*  NEUTROABS 25.1* 30.6* 23.0*  --   HGB 9.8* 9.7* 9.8* 10.2*  HCT 29.2* 29.1* 29.2* 30.6*  MCV 83.4 83.4 83.4 84.1  PLT 484* 539* 530* 545*    Scheduled Meds: . amiodarone  400 mg Oral BID  . apixaban  5 mg Oral BID  . carvedilol  18.75 mg Oral BID WC  . colchicine  0.3 mg Oral Daily  . fluticasone  2 spray Each Nare Daily  . gabapentin  300 mg Oral Daily  . insulin aspart  0-15 Units Subcutaneous TID WC  . insulin aspart  0-5 Units Subcutaneous QHS  . insulin glargine  25 Units Subcutaneous QHS  . predniSONE  50 mg Oral Q breakfast  . sodium chloride flush  3 mL Intravenous Q12H  . sodium chloride flush  3 mL Intravenous Q12H   Continuous Infusions: . sodium chloride    . sodium chloride    . sodium chloride 10 mL/hr at 05/04/17 1220   PRN Meds:.sodium chloride, acetaminophen, methocarbamol, ondansetron (ZOFRAN) IV, sodium chloride flush, sodium chloride flush, sodium chloride flush   Pearson Grippe  MD 05/06/2017, 9:57 AM

## 2017-05-06 NOTE — Progress Notes (Addendum)
PROGRESS NOTE    John Bailey  DVV:[id number] DOB: July 09, 1970 DOA: 04/22/2017 PCP: [name], MD   Chief Complaint  Patient presents with  . Generalized body swelling    Brief Narrative:  HPI on 04/22/2017 by Dr. Quintella Baton This is a 47 year old gentleman who presents with complaints of weight gain, increasing edema and difficulty walking as a result.  This is a patient with rheumatoid arthritis, diabetes mellitus, chronic fluid overload.  He was started on prednisone approximately 9 weeks ago to treat his rheumatoid arthritis.  At that time his Lasix was continued.  He was maintained on prednisone for 6 weeks.  His Lasix was restarted approximately 3 weeks ago.  With his Lasix being held he has gained weight, he states approximately 15 pounds.  Despite being restarted on his Lasix he is not lost any weight to the point now it has become difficult for him to walk.  He saw his PCP today and was sent him to the ER.  The patient denies any cough, wheeze or chest pains.  He does report increased shortness of breath with activity.  History provided by the patient.  The ER the patient is also hypoxic with ambulation, his oxygen sats going down to 80% with ambulation.  Interim history  Admitted for Acute CHF. Was placed on IV Lasix however this was discontinued as patient developed worsening renal failure.  Nephrology consulted and appreciated, patient started dialysis.  Assessment & Plan   Acute on chronic diastolic heart failure -Echocardiogram showed an EF of 55-60% with severe LVH, dilated left atrium and trivial pericardial effusion.  Unable to assess diastolic dysfunction. -Patient was started on IV Lasix however this was discontinued due to poor urine output and worsening creatinine  -Nephrology was consulted and appreciated, patient started dialysis -Vascular surgery consulted and appreciated, for permanent HD access placement -6L removed with HD on 2/20 -patient has UNNA boots  in place, wound care to see patient today- he would like a break from them  Anasarca -Secondary to volume overload  -continue management with dialysis  Rheumatoid arthritis with flare -Patient receives Abatacept every Tuesday -knee x-rays negative for knee effusion -Continue methocarbamol, gabapentin -Dr. Alfredia Ferguson, previous hospitalist discussed with Dr. 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 Will reach out to vascular today -discussed with Dr. Marylou Flesher, nephrology, feels this is now ESRD, CLIP in process   Hypertension -Blood pressure currently stable -Cardiology, Dr. Caryl Comes checking to rule out amyloid given the degree of LVH -Immunoelectrophoresis to check for amyloid however unable to collect 24-hour urine due to worsening renal failure  Hyperkalemia/hypokalemia -K 3.1 today, suspect will be replaced with dialysis  -Continue to monitor BMP  Leukocytosis -Possibly secondary to RA flare versus steroids -Previous hospitalist discussed with infectious disease, Dr. Drucilla Schmidt who believes WBC to be related to RA instead of infection  Diabetes mellitus, type II -Continue Lantus and insulin sliding scale with CBG monitoring  Hyperphosphatemia  -continue dialysis management  and monitor  Diarrhea -C. difficile checked and was unremarkable, GI pathogen panel negative -Continue to monitor  Physical deconditioning -PT/OT consulted and PT recommended SNF, OT recommended CIR -CIR consulted -Social work consulted  DVT Prophylaxis heparin  Code Status: Full  Family Communication: Friend at bedside  Disposition Plan: Admitted.   Consultants Cardiology/heart failure team Nephrology  Interventional radiology Infectious disease via phone Rheumatology via phone Vascular surgery Inpatient rehab  Procedures  Echocardiogram Right upper extremity vein mapping RIJ TDC, RIJ vein cannulation under [place], removal of RIJ vein temp dialysis catheter   Antibiotics   Anti-infectives (From admission, onward)   Start     Dose/Rate Route Frequency Ordered Stop   05/04/17 1215  ceFAZolin (ANCEF) 3 g in dextrose 5 % 50 mL IVPB    Comments:  Send with pt to OR   3 g 130 mL/hr over 30 Minutes Intravenous To Surgery 05/04/17 1202 05/04/17 1450   05/04/17 0600  ceFAZolin (ANCEF) IVPB 1 g/50 mL premix  Status:  Discontinued    Comments:  Send with pt to OR   1 g 100 mL/hr over 30 Minutes Intravenous To Surgery 05/03/17 1606 05/04/17 1202      Subjective:   John Bailey seen and examined today. Patient feeling much better today.  Feels that he has had lots of fluid and weight removed.  Feels breathing has improved.  Patient denies any current chest pain, abdominal pain, nausea or vomiting, headache or dizziness.  Would like Unna boot removed as he feels his leg swelling has improved.  Objective:   Vitals:   05/05/17 2007 05/06/17 0100 05/06/17 0312 05/06/17 0821  BP: 112/64 120/73 111/69 (!) 147/89  Pulse: 80  70 69  Resp: (!) 26  (!) 21 20  Temp:   (!) 97.4 F (36.3 C) 97.8 F (36.6 C)  TempSrc:   Oral Oral  SpO2: 95% 97% 96% 100%  Weight:   (!) 150.1 kg (330 lb 14.6 oz)   Height:        Intake/Output Summary (Last 24 hours) at 05/06/2017 1005 Last data  filed at 05/06/2017 0400 Gross per 24 hour  Intake 983 ml  Output 6001 ml  Net -5018 ml   Filed Weights   05/05/17 0800 05/05/17 1220 05/06/17 0312  Weight: (!) 156.1 kg (344 lb 2.2 oz) (!) 149.9 kg (330 lb 7.5 oz) (!) 150.1 kg (330 lb 14.6 oz)   Exam  General: Well developed, well nourished, NAD, appears stated age, morbidly obese  HEENT: NCAT, mucous membranes moist.   Neck: Supple  Cardiovascular: S1 S2 auscultated, RRR, no murmurs  Respiratory: Clear to auscultation bilaterally with equal chest rise  Abdomen: Soft, obese, nontender, nondistended, + bowel sounds  Extremities: warm dry without cyanosis clubbing.LE edema, UNNA boots in place.  Neuro: AAOx3, nonfocal  Psych: Appropriate mood and affect, pleasant   Data Reviewed: I have personally reviewed following labs and imaging studies  CBC: Recent Labs  Lab 05/01/17 0528 05/02/17 0750 05/03/17 0838 05/04/17 0540 05/05/17 0430 05/06/17 0745  WBC 24.9* 27.0* 27.6* 33.3* 26.8* 33.1*  NEUTROABS 21.0* 22.1* 25.1* 30.6* 23.0*  --   HGB 9.5* 9.8* 9.8* 9.7* 9.8* 10.2*  HCT 28.2* 29.4* 29.2* 29.1* 29.2* 30.6*  MCV 82.7 83.3 83.4 83.4 83.4 84.1  PLT 466* 503* 484* 539* 530* 748*   Basic Metabolic Panel: Recent Labs  Lab 05/01/17 0528 05/02/17 0750 05/03/17 2707 05/04/17 0540 05/05/17 0430 05/05/17 1701 05/06/17 0745  NA 131*  131* 131* 129* 131* 133* 134* 133*  K 3.6  3.6 3.5 3.1* 3.5 3.1* 3.4* 3.1*  CL 94*  94* 95* 94* 94* 97* 98* 97*  CO2 22  22 22  21* 21* 21* 22 21*  GLUCOSE 177*  183* 139* 207* 217* 138* 154* 183*  BUN 60*  61* 56* 60* 58* 50* 31* 37*  CREATININE 4.40*  4.40* 4.72* 5.38* 5.58* 5.42* 4.09* 4.84*  CALCIUM 8.6*  8.6* 8.3* 8.3* 8.5* 8.3* 8.2* 8.4*  MG 2.3 2.3 2.3 2.3 2.2  --   --   PHOS 7.1*  7.1* 5.8* 6.5* 6.9* 6.1* 3.6  --    GFR: Estimated Creatinine Clearance: 29.4 mL/min (A) (by C-G formula based on SCr of 4.84 mg/dL (H)). Liver Function Tests: Recent Labs  Lab  05/01/17 0528 05/02/17 0750 05/03/17 0838 05/04/17 0540 05/05/17 0430 05/05/17 1701  AST 10* 11* 10* 12* 12*  --   ALT 7* <5* 7* 8* 7*  --   ALKPHOS 80 83 80 70 74  --   BILITOT 0.8 0.6 0.5 0.6 0.6  --   PROT 6.2* 6.2* 5.7* 6.1* 5.9*  --   ALBUMIN 2.0*  2.0* 2.2* 2.3* 2.3* 2.4* 2.5*   No results for input(s): LIPASE, AMYLASE in the last 168 hours. No results for input(s): AMMONIA in the last 168 hours. Coagulation Profile: Recent Labs  Lab 05/04/17 0540  INR 1.34   Cardiac Enzymes: No results for input(s): CKTOTAL, CKMB, CKMBINDEX, TROPONINI in the last 168 hours. BNP (last 3 results) No results for input(s): PROBNP in the last 8760 hours. HbA1C: No results for input(s): HGBA1C in the last 72 hours. CBG: Recent Labs  Lab 05/04/17 2100 05/05/17 0728 05/05/17 1624 05/05/17 2146 05/06/17 0744  GLUCAP 192* 136* 145* 238* 167*   Lipid Profile: No results for input(s): CHOL, HDL, LDLCALC, TRIG, CHOLHDL, LDLDIRECT in the last 72 hours. Thyroid Function Tests: No results for input(s): TSH, T4TOTAL, FREET4, T3FREE, THYROIDAB in the last 72 hours. Anemia Panel: No results for input(s): VITAMINB12, FOLATE, FERRITIN, TIBC, IRON, RETICCTPCT in the last 72 hours. Urine analysis:    Component Value Date/Time   COLORURINE YELLOW 04/25/2017 2057   APPEARANCEUR CLEAR 04/25/2017 2057   LABSPEC 1.010 04/25/2017 2057   PHURINE 5.0 04/25/2017 2057   GLUCOSEU NEGATIVE 04/25/2017 2057   HGBUR NEGATIVE 04/25/2017 2057   BILIRUBINUR NEGATIVE 04/25/2017 2057   KETONESUR NEGATIVE 04/25/2017 2057   PROTEINUR NEGATIVE 04/25/2017 2057   NITRITE NEGATIVE 04/25/2017 2057   LEUKOCYTESUR NEGATIVE 04/25/2017 2057   Sepsis Labs: @LABRCNTIP (procalcitonin:4,lacticidven:4)  ) Recent Results (from the past 240 hour(s))  Gastrointestinal Panel by PCR , Stool     Status: None   Collection Time: 05/02/17  2:42 PM  Result Value Ref Range Status   Campylobacter species NOT DETECTED NOT DETECTED  Final   Plesimonas shigelloides NOT DETECTED NOT DETECTED Final   Salmonella species NOT DETECTED NOT DETECTED Final   Yersinia enterocolitica NOT DETECTED NOT DETECTED Final   Vibrio species NOT DETECTED NOT DETECTED Final   Vibrio cholerae NOT DETECTED NOT DETECTED Final   Enteroaggregative E coli (EAEC) NOT DETECTED NOT DETECTED Final   Enteropathogenic E coli (EPEC) NOT DETECTED NOT DETECTED Final   Enterotoxigenic E coli (ETEC) NOT DETECTED NOT DETECTED Final   Shiga like toxin producing E coli (STEC) NOT DETECTED NOT DETECTED Final   Shigella/Enteroinvasive E coli (EIEC) NOT DETECTED NOT DETECTED Final   Cryptosporidium NOT DETECTED NOT DETECTED Final  Cyclospora cayetanensis NOT DETECTED NOT DETECTED Final   Entamoeba histolytica NOT DETECTED NOT DETECTED Final   Giardia lamblia NOT DETECTED NOT DETECTED Final   Adenovirus F40/41 NOT DETECTED NOT DETECTED Final   Astrovirus NOT DETECTED NOT DETECTED Final   Norovirus GI/GII NOT DETECTED NOT DETECTED Final   Rotavirus A NOT DETECTED NOT DETECTED Final   Sapovirus (I, II, IV, and V) NOT DETECTED NOT DETECTED Final    Comment: Performed at [facility], [address] [place], [place] [address]  C difficile quick scan w PCR reflex     Status: None   Collection Time: 05/02/17  2:42 PM  Result Value Ref Range Status   C Diff antigen NEGATIVE NEGATIVE Final   C Diff toxin NEGATIVE NEGATIVE Final   C Diff interpretation No C. difficile detected.  Final    Comment: Performed at [facility], [place] [address], [place], [place] [address]  Surgical pcr screen     Status: None   Collection Time: 05/04/17  6:53 AM  Result Value Ref Range Status   MRSA, PCR NEGATIVE NEGATIVE Final   Staphylococcus aureus NEGATIVE NEGATIVE Final    Comment: (NOTE) The Xpert SA Assay (FDA approved for NASAL specimens in patients 85 years of age and older), is one component of a comprehensive surveillance program. It is not intended to  diagnose infection nor to guide or monitor treatment. Performed at [facility], [place] [address], [place], [place] [address]       Radiology Studies: Dg Chest Port 1 View  Result Date: 05/04/2017 CLINICAL DATA:  Dialysis catheter placement. EXAM: PORTABLE CHEST 1 VIEW COMPARISON:  04/29/2017. FINDINGS: Right IJ dual-lumen catheter noted with tip at the cavoatrial junction. Cardiomegaly with mild pulmonary vascular prominence. No focal infiltrate. No pleural effusion or pneumothorax. IMPRESSION: 1. Right IJ dual-lumen catheter noted with tip at the cavoatrial junction. 2.  Cardiomegaly with mild pulmonary venous congestion. Electronically Signed   By: Marcello Moores  Register   On: 05/04/2017 16:23   Dg Fluoro Guide Cv Line-no Report  Result Date: 05/04/2017 Fluoroscopy was utilized by the requesting physician.  No radiographic interpretation.   Vas [place] Upper Extremity Arterial Duplex  Result Date: 05/03/2017 UPPER EXTREMITY DUPLEX STUDY Indications: Pre-op for AVF. Examination Guidelines: A complete evaluation includes B-mode imaging, spectral doppler, color doppler, and power doppler as needed of all accessible portions of each vessel. Bilateral testing is considered an integral part of a complete examination. Limited examinations for reoccurring indications may be performed as noted.  Right Pre-Dialysis Findings: +-----------------+----------+------------------+---------+--------------------+ Location         PSV (cm/s)Intralum. Diam.   Waveform Comments                                        (mm)                                            +-----------------+----------+------------------+---------+--------------------+ Brachial Antecub.50                          triphasicproximal upper arm   fossa                                                                      +-----------------+----------+------------------+---------+--------------------+  Radial Art at    51         2.60              biphasic high bifurcation in  Wrist                                                 the upper arm        +-----------------+----------+------------------+---------+--------------------+ Ulnar Art at     44        3.00              biphasic high bifurcation in  Wrist                                                 the upper arm        +-----------------+----------+------------------+---------+--------------------+ Left Pre-Dialysis Findings: +-----------------------+----------+--------------------+---------+--------+ Location               PSV (cm/s)Intralum. Diam. (mm)Waveform Comments +-----------------------+----------+--------------------+---------+--------+ Brachial Antecub. fossa47        6.20                triphasic         +-----------------------+----------+--------------------+---------+--------+ Radial Art at Wrist    67        3.20                                  +-----------------------+----------+--------------------+---------+--------+ Ulnar Art at Wrist     73        4.70                triphasic         +-----------------------+----------+--------------------+---------+--------+ Final Interpretation:  Right: No obstruction visualized in the right upper extremity. Left: No obstruction visualized in the left upper extremity. *See table(s) above for measurements and observations. Electronically signed by Curt Jews on 05/03/2017 at 7:11:24 PM.   Vas [place] Upper Ext Vein Mapping (pre-op Avf)  Result Date: 05/03/2017 UPPER EXTREMITY VEIN MAPPING History: Pre-access. Examination Guidelines: A complete evaluation includes B-mode imaging, spectral doppler, color doppler, and power doppler as needed of all accessible portions of each vessel. Bilateral testing is considered an integral part of a complete examination. Limited examinations for reoccurring indications may be performed as noted. +-----------------+-------------+----------+---------+  Right Cephalic   Diameter (mm)Depth (mm)Findings  +-----------------+-------------+----------+---------+ Shoulder             3.80        6.30             +-----------------+-------------+----------+---------+ Prox upper arm       3.10       11.80             +-----------------+-------------+----------+---------+ Mid upper arm                           bandages  +-----------------+-------------+----------+---------+ Dist upper arm       2.40        4.60             +-----------------+-------------+----------+---------+ Antecubital fossa    1.70        9.40             +-----------------+-------------+----------+---------+  Prox forearm         3.80        9.60             +-----------------+-------------+----------+---------+ Mid forearm          2.90        9.40   branching +-----------------+-------------+----------+---------+ Dist forearm         3.20        2.80             +-----------------+-------------+----------+---------+ Wrist                1.90        2.70             +-----------------+-------------+----------+---------+ +-----------------+-------------+----------+--------------+ Right Basilic    Diameter (mm)Depth (mm)   Findings    +-----------------+-------------+----------+--------------+ Shoulder                                not visualized +-----------------+-------------+----------+--------------+ Prox upper arm                          not visualized +-----------------+-------------+----------+--------------+ Mid upper arm                           not visualized +-----------------+-------------+----------+--------------+ Dist upper arm                          not visualized +-----------------+-------------+----------+--------------+ Antecubital fossa                       not visualized +-----------------+-------------+----------+--------------+ Prox forearm         2.10                               +-----------------+-------------+----------+--------------+ Mid forearm          1.50                              +-----------------+-------------+----------+--------------+ Distal forearm       1.60                              +-----------------+-------------+----------+--------------+ Wrist                1.40                              +-----------------+-------------+----------+--------------+ +-----------------+-------------+----------+---------+ Left Cephalic    Diameter (mm)Depth (mm)Findings  +-----------------+-------------+----------+---------+ Shoulder             3.10       20.00             +-----------------+-------------+----------+---------+ Prox upper arm       3.60       13.50             +-----------------+-------------+----------+---------+ Mid upper arm        2.90       10.00             +-----------------+-------------+----------+---------+ Dist upper arm       2.70       10.00             +-----------------+-------------+----------+---------+  Antecubital fossa    3.70        3.00             +-----------------+-------------+----------+---------+ Prox forearm         2.80        8.50   branching +-----------------+-------------+----------+---------+ Mid forearm          2.90        4.90             +-----------------+-------------+----------+---------+ Dist forearm         2.80        4.20             +-----------------+-------------+----------+---------+ Wrist                3.50        4.10             +-----------------+-------------+----------+---------+ +-----------------+-------------+----------+--------------+ Left Basilic     Diameter (mm)Depth (mm)   Findings    +-----------------+-------------+----------+--------------+ Shoulder                                not visualized +-----------------+-------------+----------+--------------+ Prox upper arm                          not visualized  +-----------------+-------------+----------+--------------+ Mid upper arm                           not visualized +-----------------+-------------+----------+--------------+ Dist upper arm                          not visualized +-----------------+-------------+----------+--------------+ Antecubital fossa    3.40                              +-----------------+-------------+----------+--------------+ Prox forearm         4.00                              +-----------------+-------------+----------+--------------+ Mid forearm          1.40                              +-----------------+-------------+----------+--------------+ Distal forearm                          not visualized +-----------------+-------------+----------+--------------+ Elbow                                   not visualized +-----------------+-------------+----------+--------------+ Wrist                                   not visualized +-----------------+-------------+----------+--------------+ Final Interpretation: *See table(s) above for measurements and observations.  Curt Jews Electronically signed by Curt Jews on 05/03/2017 at 7:10:36 PM.      Scheduled Meds: . amiodarone  400 mg Oral BID  . apixaban  5 mg Oral BID  . carvedilol  18.75 mg Oral BID WC  . colchicine  0.3 mg Oral Daily  . fluticasone  2 spray Each Nare Daily  . gabapentin  300  mg Oral Daily  . insulin aspart  0-15 Units Subcutaneous TID WC  . insulin aspart  0-5 Units Subcutaneous QHS  . insulin glargine  25 Units Subcutaneous QHS  . predniSONE  50 mg Oral Q breakfast  . sodium chloride flush  3 mL Intravenous Q12H  . sodium chloride flush  3 mL Intravenous Q12H   Continuous Infusions: . sodium chloride    . sodium chloride    . sodium chloride 10 mL/hr at 05/04/17 1220     LOS: 14 days   Time Spent in minutes   30 minutes  Ruben Mahler D.O. on 05/06/2017 at 10:05 AM  Between 7am to 7pm - Pager -  [phone]  After 7pm go to [email or web address] - password [email or web address]  And look for the night coverage person covering for me after hours  [facility] Office  [phone]

## 2017-05-06 NOTE — Progress Notes (Signed)
Occupational Therapy Treatment Patient Details Name: John Bailey MRN: [id number] DOB: [birth year] Today's Date: 05/06/2017    History of present illness TREVEL DILLENBECK is a 47 y.o. rheumatoid arthritis, diabetes mellitus, heart failure, atrial fibrillation. Patient presented with dyspnea and found to have a CHF exacerbation   OT comments  Pt making steady progress towards goals and demonstrates determination to progress with therapy. Pt completing bed mobility and functional mobility this session with overall ModA+2, completing transfers EOB<>BSC as well as sit<>standx4 during session. Pt requiring totalA for toileting ADLs/peri-care after BM. Feel pt remains an excellent candidate for CIR level services to maximize his overall safety and independence with ADLs and mobility prior to return home. Will continue to follow while in acute setting.   Follow Up Recommendations  CIR;Supervision/Assistance - 24 hour    Equipment Recommendations  Other (comment)(TBD in next venue )          Precautions / Restrictions Precautions Precautions: Fall Restrictions Weight Bearing Restrictions: No       Mobility Bed Mobility Overal bed mobility: Needs Assistance Bed Mobility: Supine to Sit;Sit to Supine     Supine to sit: Max assist;+2 for physical assistance Sit to supine: Mod assist;+2 for safety/equipment   General bed mobility comments: Pt able to initiate advancing LEs towards EOB, use of HHA to assist Pt with pulling trunk upright into sitting; MaxA+2 for trunk/LE support when returning to supine   Transfers Overall transfer level: Needs assistance Equipment used: 2 person hand held assist Transfers: Sit to/from Stand;Stand Pivot Transfers;Squat Pivot Transfers Sit to Stand: Mod assist;+2 physical assistance;+2 safety/equipment Stand pivot transfers: Mod assist;+2 physical assistance;+2 safety/equipment Squat pivot transfers: +2 physical assistance;+2 safety/equipment      General transfer comment: Pt completing squat pivot transfer EOB>BSC with MaxA+2, progressed to ModA+2 for remainder of session, Pt completing sit<>stand x4 during session for peri-care and pad adjustment, standing approx 20-30 sec at a time with modA+2, Pt completing stand pivot transfer BSC>EOB with ModA+2    Balance Overall balance assessment: Needs assistance Sitting-balance support: Single extremity supported;Feet supported Sitting balance-Leahy Scale: Fair Sitting balance - Comments: supervision for static sitting      Standing balance-Leahy Scale: Poor Standing balance comment: reliant on external support for standing balance                            ADL either performed or assessed with clinical judgement   ADL Overall ADL's : Needs assistance/impaired Eating/Feeding: Moderate assistance;Sitting Eating/Feeding Details (indicate cue type and reason): discussed option of using built up utensils for increased independence during task completion, to be further assessed in next session                  Lower Body Dressing: Total assistance Lower Body Dressing Details (indicate cue type and reason): totalA to doff/don socks this session  Toilet Transfer: Maximal assistance;Moderate assistance;+2 for physical assistance;+2 for safety/equipment;BSC Toilet Transfer Details (indicate cue type and reason): MaxA+2 for squat pivot to [facility]; ModA+2 (+3 for safety) for stand pivot BSC back to EOB Toileting- Clothing Manipulation and Hygiene: Total assistance;Sit to/from stand Toileting - Clothing Manipulation Details (indicate cue type and reason): +2 providing standing assist while additional assist provides peri-care after BM      Functional mobility during ADLs: Moderate assistance;+2 for physical assistance;+2 for safety/equipment General ADL Comments: Pt completing sit<>standx4 during session with overall ModA+2; completes squat pivot EOB>BSC with MaxA+2, ModA+2 for  stand pivot BSC>EOB                        Cognition Arousal/Alertness: Awake/alert Behavior During Therapy: WFL for tasks assessed/performed Overall Cognitive Status: Within Functional Limits for tasks assessed                                 General Comments: at times slow to respond but appears [facility]                          Pertinent Vitals/ Pain       Pain Assessment: Faces Faces Pain Scale: Hurts even more Pain Location: L wrist; B LE Pain Descriptors / Indicators: Discomfort;Constant;Grimacing Pain Intervention(s): Limited activity within patient's tolerance;Monitored during session                                                          Frequency  Min 3X/week        Progress Toward Goals  OT Goals(current goals can now be found in the care plan section)  Progress towards OT goals: Progressing toward goals  Acute Rehab OT Goals Patient Stated Goal: To walk out of the hospital OT Goal Formulation: With patient Time For Goal Achievement: 05/19/17 Potential to Achieve Goals: Good  Plan Frequency needs to be updated        PT/OT/SLP Co-Evaluation/Treatment: Yes Reason for Co-Treatment: Complexity of the patient's impairments (multi-system involvement);For patient/therapist safety;To address functional/ADL transfers   OT goals addressed during session: ADL's and self-care      AM-PAC PT "6 Clicks" Daily Activity     Outcome Measure   Help from another person eating meals?: A Lot Help from another person taking care of personal grooming?: A Lot Help from another person toileting, which includes using toliet, bedpan, or urinal?: Total Help from another person bathing (including washing, rinsing, drying)?: A Lot Help from another person to put on and taking off regular upper body clothing?: A Lot Help from another person to put on and taking off regular lower body clothing?: Total 6 Click Score: 10     End of Session Equipment Utilized During Treatment: Gait belt  OT Visit Diagnosis: Other abnormalities of gait and mobility (R26.89);Muscle weakness (generalized) (M62.81);Pain Pain - Right/Left: Left Pain - part of body: (wrist, generalized throughout body and bil LE)   Activity Tolerance Patient tolerated treatment well   Patient Left in bed;with call bell/phone within reach;with nursing/sitter in room;with family/visitor present   Nurse Communication Mobility status        Time: 3785-8850 OT Time Calculation (min): 50 min  Charges: OT General Charges $OT Visit: 1 Visit OT Treatments $Self Care/Home Management : 23-37 mins  Lou Cal, OT Pager [phone] 05/06/2017    [name] 05/06/2017, 4:20 PM

## 2017-05-06 NOTE — Progress Notes (Signed)
ANTICOAGULATION CONSULT NOTE - Initial Consult  Pharmacy Consult for heparin Indication: atrial fibrillation  No Known Allergies  Patient Measurements: Height: 6' (182.9 cm) Weight: (!) 332 lb 10.8 oz (150.9 kg) IBW/kg (Calculated) : 77.6  Vital Signs: Temp: 97.1 F (36.2 C) (02/21 0900) Temp Source: Oral (02/21 0900) BP: 144/95 (02/21 1000) Pulse Rate: 65 (02/21 1000)  Labs: Recent Labs    05/03/17 1818  05/04/17 0540 05/05/17 0430 05/05/17 1701 05/06/17 0745  HGB  --    < > 9.7* 9.8*  --  10.2*  HCT  --   --  29.1* 29.2*  --  30.6*  PLT  --   --  539* 530*  --  545*  LABPROT  --   --  16.5*  --   --   --   INR  --   --  1.34  --   --   --   HEPARINUNFRC 0.45  --  0.73* 0.46  --   --   CREATININE  --    < > 5.58* 5.42* 4.09* 4.84*   < > = values in this interval not displayed.    Estimated Creatinine Clearance: 29.4 mL/min (A) (by C-G formula based on SCr of 4.84 mg/dL (H)).   Medical History: Past Medical History:  Diagnosis Date  . Atrial flutter with rapid ventricular response ([place]) 08/06/2015   a. s/p DCCV in 07/2015 with recurrent atrial fibrillation and DCCV in 12/2016. Initially successful but noted to be back in atrial fibrillation within 2 weeks of DCCV.   [name] Chronic diastolic (congestive) heart failure (HCC)    a. EF 50-55% by echo in 06/2016.  . Diabetes ([place])   . Hypertension    Assessment: 47 year old male with afib on xarelto pta. Patient is now ESRD to start HD CLIP process. Patient was transitioned to apixaban and received two doses on 2/20. Needs permanent HD access so will hold apixaban and start IV heparin.   Due to recent apixaban dosing, heparin levels will be inaccurate. Will follow aptt's for monitoring.   Patient was previously therapeutic on 2350 units/hr of heparin, will restart at that rate. No bolusing as last dose of apixaban was given last night.   Goal of Therapy:  Aptt goal 66-102 Heparin level 0.3-0.7 units/ml Monitor  platelets by anticoagulation protocol: Yes   Plan:  Start heparin infusion at 2350  units/hr Check anti-Xa level in 8 hours and daily while on heparin Continue to monitor H&H and platelets  Erin Hearing PharmD., BCPS Clinical Pharmacist 05/06/2017 11:24 AM

## 2017-05-06 NOTE — Consult Note (Signed)
[facility] Nurse wound consult note Reason for Consult:  Patient requesting discontinuance of bilateral compression wraps Wound type:  All previously open wounds are healed Patient is requesting removal of bilateral LE compression wraps and placement of compression hose.  Compression wraps removed, no open wound found on either leg.  Patient verbalized his understanding that the wounds may recur and that continued compression is required to prevent and treat venous stasis ulcers.    Patient agreed to try Prevalon boots.  Discussed POC with patient and bedside nurse.  Re consult if needed, will not follow at this time. Thank you,  Val Riles MSN,RN,CWOCN,CNS-BC, [phone])

## 2017-05-06 NOTE — Progress Notes (Signed)
Down to HD via pt bed.

## 2017-05-06 NOTE — Progress Notes (Signed)
CSW continuing to follow, as SNF initially recommended by PT. CSW noted CIR consult as well. CSW to continue to follow and support with disposition planning.  John Bailey, [place]

## 2017-05-06 NOTE — Progress Notes (Signed)
Physical Therapy Treatment Patient Details Name: John Bailey MRN: [id number] DOB: 07-09-1970 Today's Date: 05/06/2017    History of Present Illness John Bailey is a 47 y.o. rheumatoid arthritis, diabetes mellitus, heart failure, atrial fibrillation. Patient presented with dyspnea and found to have a CHF exacerbation    PT Comments    Pt making good gains each treatment.  Today emphasis on less assist to EOB, sitting tolerance, prepping legs for standing, transfers to/from [facility] and 4 standing trials, including some sustained standing for pericare.   Follow Up Recommendations  CIR;Supervision/Assistance - 24 hour     Equipment Recommendations  Other (comment)(TBA)    Recommendations for Other Services       Precautions / Restrictions Precautions Precautions: Fall Restrictions Weight Bearing Restrictions: No    Mobility  Bed Mobility Overal bed mobility: Needs Assistance Bed Mobility: Supine to Sit;Sit to Supine     Supine to sit: +2 for physical assistance;Mod assist Sit to supine: Mod assist;+2 for safety/equipment   General bed mobility comments: Pt able to initiate advancing LEs towards EOB, use of HHA to assist Pt with pulling trunk upright into sitting; MaxA+2 for trunk/LE support when returning to supine   Transfers Overall transfer level: Needs assistance Equipment used: 2 person hand held assist Transfers: Sit to/from Stand;Stand Pivot Transfers;Squat Pivot Transfers Sit to Stand: Mod assist;+2 physical assistance;+2 safety/equipment Stand pivot transfers: Mod assist;+2 physical assistance;+2 safety/equipment Squat pivot transfers: +2 physical assistance;+2 safety/equipment     General transfer comment: Pt completing squat pivot transfer EOB>BSC with MaxA+2, progressed to ModA+2 for remainder of session, Pt completing sit<>stand x4 during session for peri-care and pad adjustment, standing approx 20-30 sec at a time with modA+2, Pt completing stand pivot  transfer BSC>EOB with ModA+2  Ambulation/Gait             General Gait Details: pivotal steps bsc to bed only   Stairs            Wheelchair Mobility    Modified Rankin (Stroke Patients Only)       Balance Overall balance assessment: Needs assistance Sitting-balance support: Single extremity supported;Feet supported Sitting balance-Leahy Scale: Fair Sitting balance - Comments: supervision for static sitting      Standing balance-Leahy Scale: Poor Standing balance comment: reliant on external support for standing balance                             Cognition Arousal/Alertness: Awake/alert Behavior During Therapy: WFL for tasks assessed/performed Overall Cognitive Status: Within Functional Limits for tasks assessed                                 General Comments: at times slow to respond but appears [facility]      Exercises Other Exercises Other Exercises: A/AAROM to Bil LE and BIL UE's    General Comments        Pertinent Vitals/Pain Pain Assessment: Faces Faces Pain Scale: Hurts even more Pain Location: L wrist; B LE Pain Descriptors / Indicators: Discomfort;Constant;Grimacing Pain Intervention(s): Limited activity within patient's tolerance;Monitored during session    Home Living                      Prior Function            PT Goals (current goals can now be found in the care plan section) Acute  Rehab PT Goals Patient Stated Goal: To walk out of the hospital PT Goal Formulation: With patient Time For Goal Achievement: 05/08/17 Potential to Achieve Goals: Fair Progress towards PT goals: Progressing toward goals    Frequency    Min 3X/week      PT Plan Current plan remains appropriate    Co-evaluation PT/OT/SLP Co-Evaluation/Treatment: Yes Reason for Co-Treatment: Complexity of the patient's impairments (multi-system involvement) PT goals addressed during session: Mobility/safety with mobility OT  goals addressed during session: ADL's and self-care      AM-PAC PT "6 Clicks" Daily Activity  Outcome Measure  Difficulty turning over in bed (including adjusting bedclothes, sheets and blankets)?: Unable Difficulty moving from lying on back to sitting on the side of the bed? : Unable Difficulty sitting down on and standing up from a chair with arms (e.g., wheelchair, bedside commode, etc,.)?: Unable Help needed moving to and from a bed to chair (including a wheelchair)?: A Lot Help needed walking in hospital room?: Total Help needed climbing 3-5 steps with a railing? : Total 6 Click Score: 7    End of Session   Activity Tolerance: Patient tolerated treatment well Patient left: in bed;with call bell/phone within reach;with bed alarm set Nurse Communication: Mobility status PT Visit Diagnosis: Other abnormalities of gait and mobility (R26.89);Muscle weakness (generalized) (M62.81)     Time: 8550-1586 PT Time Calculation (min) (ACUTE ONLY): 50 min  Charges:  $Therapeutic Activity: 8-22 mins                    G Codes:       2017-05-18  John Bailey, PT [phone]  (pager)   John Bailey 05/18/17, 5:47 PM

## 2017-05-07 LAB — BASIC METABOLIC PANEL
Anion gap: 14 (ref 5–15)
BUN: 25 mg/dL — AB (ref 6–20)
CHLORIDE: 99 mmol/L — AB (ref 101–111)
CO2: 22 mmol/L (ref 22–32)
CREATININE: 4.33 mg/dL — AB (ref 0.61–1.24)
Calcium: 8.3 mg/dL — ABNORMAL LOW (ref 8.9–10.3)
GFR calc Af Amer: 18 mL/min — ABNORMAL LOW (ref >60)
GFR calc non Af Amer: 15 mL/min — ABNORMAL LOW (ref >60)
GLUCOSE: 127 mg/dL — AB (ref 65–99)
Potassium: 2.6 mmol/L — CL (ref 3.5–5.1)
SODIUM: 135 mmol/L (ref 135–145)

## 2017-05-07 LAB — GLUCOSE, CAPILLARY
GLUCOSE-CAPILLARY: 264 mg/dL — AB (ref 65–99)
Glucose-Capillary: 196 mg/dL — ABNORMAL HIGH (ref 65–99)
Glucose-Capillary: 211 mg/dL — ABNORMAL HIGH (ref 65–99)

## 2017-05-07 LAB — CBC
HCT: 29.4 % — ABNORMAL LOW (ref 39.0–52.0)
Hemoglobin: 9.8 g/dL — ABNORMAL LOW (ref 13.0–17.0)
MCH: 28.2 pg (ref 26.0–34.0)
MCHC: 33.3 g/dL (ref 30.0–36.0)
MCV: 84.5 fL (ref 78.0–100.0)
Platelets: 484 10*3/uL — ABNORMAL HIGH (ref 150–400)
RBC: 3.48 MIL/uL — ABNORMAL LOW (ref 4.22–5.81)
RDW: 18.6 % — AB (ref 11.5–15.5)
WBC: 27.1 10*3/uL — ABNORMAL HIGH (ref 4.0–10.5)

## 2017-05-07 LAB — HEPARIN LEVEL (UNFRACTIONATED): Heparin Unfractionated: 1.24 IU/mL — ABNORMAL HIGH (ref 0.30–0.70)

## 2017-05-07 LAB — APTT
APTT: 103 s — AB (ref 24–36)
aPTT: 111 seconds — ABNORMAL HIGH (ref 24–36)
aPTT: 73 seconds — ABNORMAL HIGH (ref 24–36)

## 2017-05-07 LAB — POTASSIUM: Potassium: 3 mmol/L — ABNORMAL LOW (ref 3.5–5.1)

## 2017-05-07 MED ORDER — HYOSCYAMINE SULFATE 0.125 MG SL SUBL
0.2500 mg | SUBLINGUAL_TABLET | Freq: Four times a day (QID) | SUBLINGUAL | Status: DC | PRN
  Administered 2017-05-07: 0.25 mg via SUBLINGUAL
  Filled 2017-05-07 (×2): qty 2

## 2017-05-07 MED ORDER — PREDNISONE 20 MG PO TABS
30.0000 mg | ORAL_TABLET | Freq: Every day | ORAL | Status: DC
  Administered 2017-05-08 – 2017-05-09 (×2): 30 mg via ORAL
  Filled 2017-05-07 (×2): qty 1

## 2017-05-07 MED ORDER — AMIODARONE HCL 200 MG PO TABS
200.0000 mg | ORAL_TABLET | Freq: Two times a day (BID) | ORAL | Status: DC
  Administered 2017-05-07 – 2017-05-13 (×11): 200 mg via ORAL
  Filled 2017-05-07 (×11): qty 1

## 2017-05-07 MED ORDER — POTASSIUM CHLORIDE CRYS ER 20 MEQ PO TBCR
40.0000 meq | EXTENDED_RELEASE_TABLET | Freq: Once | ORAL | Status: AC
  Administered 2017-05-07: 40 meq via ORAL

## 2017-05-07 NOTE — Plan of Care (Signed)
  Progressing Health Behavior/Discharge Planning: Ability to manage health-related needs will improve 05/07/2017 1431 - Progressing by Brayleigh Rybacki, Vertell Limber, RN Clinical Measurements: Ability to maintain clinical measurements within normal limits will improve 05/07/2017 1431 - Progressing by Evalee Jefferson, RN Diagnostic test results will improve 05/07/2017 1431 - Progressing by Evalee Jefferson, RN Respiratory complications will improve 05/07/2017 1431 - Progressing by Evalee Jefferson, RN Cardiovascular complication will be avoided 05/07/2017 1431 - Progressing by Octavis Sheeler, Vertell Limber, RN

## 2017-05-07 NOTE — Progress Notes (Signed)
[place] for heparin Indication: atrial fibrillation  No Known Allergies  Patient Measurements: Height: 6' (182.9 cm) Weight: (!) 313 lb 7.9 oz (142.2 kg) IBW/kg (Calculated) : 77.6  Heparin dosing weight: 111 kg  Vital Signs: Temp: 99.5 F (37.5 C) (02/22 0753) Temp Source: Oral (02/22 0753) BP: 130/72 (02/22 0830) Pulse Rate: 77 (02/22 0830)  Labs: Recent Labs    05/05/17 0430 05/05/17 1701 05/06/17 0745 05/06/17 2239 05/07/17 0516  HGB 9.8*  --  10.2*  --  9.8*  HCT 29.2*  --  30.6*  --  29.4*  PLT 530*  --  545*  --  484*  APTT  --   --   --  103* 111*  HEPARINUNFRC 0.46  --   --  1.24*  --   CREATININE 5.42* 4.09* 4.84*  --  4.33*    Medical History: Past Medical History:  Diagnosis Date  . Atrial flutter with rapid ventricular response ([place]) 08/06/2015   a. s/p DCCV in 07/2015 with recurrent atrial fibrillation and DCCV in 12/2016. Initially successful but noted to be back in atrial fibrillation within 2 weeks of DCCV.   [name] Chronic diastolic (congestive) heart failure (HCC)    a. EF 50-55% by echo in 06/2016.  . Diabetes ([place])   . Hypertension     Assessment: 47 year old male with afib on xarelto pta. Patient is now ESRD to start HD CLIP process. Patient was transitioned to apixaban and received two doses on 2/20. Needs permanent HD access so will hold apixaban and start IV heparin.   Due to recent apixaban dosing, heparin levels will be inaccurate. Will follow aptt's for monitoring.   APTT above goal this morning at 111s. Will reduce rate and recheck level this afternoon. No bleeding issues noted, cbc stable.    Goal of Therapy:  Aptt goal 66-102 Heparin level 0.3-0.7 units/ml Monitor platelets by anticoagulation protocol: Yes    Plan:  -Reduce heparin to 1900 units/hr -Daily HL, CBC, aPTT -Recheck levels in 6 hours  Erin Hearing PharmD., BCPS Clinical Pharmacist 05/07/2017 9:19 AM

## 2017-05-07 NOTE — Care Management Note (Addendum)
Case Management Note  Patient Details  Name: John Bailey MRN: [id number] Date of Birth: 12/24/1970  Subjective/Objective: Pt presented for Atrial FIb, Acute on Chronic CHF and CKD Stage IV- initiated on IV Lasix gtt and IV Amio gtt. Now new start to HD with Daily treatments-tunneled HD cath placed 05-05-17. PTA from home with the support of wife. Initially plan for disposition was for SNF and goal was working with patient to be able to sit for dialysis in a chair. Plan for CIR at this time. Inpatient Rehab consult placed- Melissa with Inpatient Rehab coordinator to visit with patient- plan is to see if patient can tolerate CIR. Therapy to continue to work with patient.                    Action/Plan: CLIP in process- CM will continue to monitor for additional needs.   Expected Discharge Date:                  Expected Discharge Plan:  [place]  In-House Referral:  Clinical Social Work  Discharge planning Services  CM Consult  Post Acute Care Choice:   N/A Choice offered to:    N/A  DME Arranged:   N/A DME Agency:   N/A  HH Arranged:   N/A HH Agency:   N/A  Status of Service: COMPLETED  If discussed at [place] of Stay Meetings, dates discussed: 05-04-17, 05-06-17, 05-11-17, 05-13-17  Additional Comments: 05-13-17 Jacqlyn Krauss, RN,BSN [phone] Pt will d/c to Inpatient Rehab post HD. [facility] to follow the patient for [facility] post Rehab. No further needs from CM at this time.    05-12-17 [place], [place] [phone] CLIP Process completed: pt has schedule at [place] schedule chair time 12:00. CIR submitting clinicals to insurance to see if approved for rehab. CM will continue to monitor. No further needs from CM at this time.    05-11-17 Jacqlyn Krauss, RN,BSN [phone] CIR following the patient to see how pt tolerates therapy. CM will continue to monitor for additional needs. Benefits Check completed for Eliquis.   S/W  JUSTIN @ [facility] # [phone]     ELIQUIS 5 MG BID  COVER- YES  CO-PAY- ZERO DOLLARS  PRIOR APPROVAL- NO   PREFERRED PHARMACY : [facility]  MAIL-ORDER [address] DAY SUPPLY ZERO DOLLARS  Bethena Roys, RN 05/07/2017, 1:24 PM

## 2017-05-07 NOTE — Progress Notes (Signed)
ANTICOAGULATION CONSULT NOTE  Pharmacy Consult for heparin Indication: atrial fibrillation  No Known Allergies  Patient Measurements: Height: 6' (182.9 cm) Weight: (!) 320 lb 8.8 oz (145.4 kg) IBW/kg (Calculated) : 77.6  Heparin dosing weight: 111 kg  Vital Signs: Temp: 99.6 F (37.6 C) (02/21 2147) Temp Source: Oral (02/21 2147) BP: 114/64 (02/22 0105) Pulse Rate: 81 (02/22 0105)  Labs: Recent Labs    05/04/17 0540 05/05/17 0430 05/05/17 1701 05/06/17 0745 05/06/17 2239  HGB 9.7* 9.8*  --  10.2*  --   HCT 29.1* 29.2*  --  30.6*  --   PLT 539* 530*  --  545*  --   APTT  --   --   --   --  103*  LABPROT 16.5*  --   --   --   --   INR 1.34  --   --   --   --   HEPARINUNFRC 0.73* 0.46  --   --   --   CREATININE 5.58* 5.42* 4.09* 4.84*  --     Medical History: Past Medical History:  Diagnosis Date  . Atrial flutter with rapid ventricular response ([place]) 08/06/2015   a. s/p DCCV in 07/2015 with recurrent atrial fibrillation and DCCV in 12/2016. Initially successful but noted to be back in atrial fibrillation within 2 weeks of DCCV.   [name] Chronic diastolic (congestive) heart failure (HCC)    a. EF 50-55% by echo in 06/2016.  . Diabetes ([place])   . Hypertension     Assessment: 47 year old male with afib on xarelto pta. Patient is now ESRD to start HD CLIP process. Patient was transitioned to apixaban and received two doses on 2/20. Needs permanent HD access so will hold apixaban and start IV heparin.   Due to recent apixaban dosing, heparin levels will be inaccurate. Will follow aptt's for monitoring.   APTT and HL are both high; will go off of aPTT for now given Eliquis last dose 2/20 and poor renal function.     Goal of Therapy:  Aptt goal 66-102 Heparin level 0.3-0.7 units/ml Monitor platelets by anticoagulation protocol: Yes    Plan:  -Reduce heparin to 2100 units/hr -Daily HL, CBC, aPTT -Recheck levels in 6 hours -Discussed with RN   Harvel Quale 05/07/2017 1:14 AM

## 2017-05-07 NOTE — Progress Notes (Signed)
Occupational Therapy Treatment Patient Details Name: John Bailey MRN: [id number] DOB: [birth year] Today's Date: 05/07/2017    History of present illness John Bailey is a 46 y.o. rheumatoid arthritis, diabetes mellitus, heart failure, atrial fibrillation. Patient presented with dyspnea and found to have a CHF exacerbation   OT comments  Pt progressing towards goals and agreeable to work with therapy. Pt completing bed level therapeutic exercise/stretching to bil UEs and LEs this session. Issued built up handle for increased independence during self-feeding and grooming task with pt verbalizing and demonstrating good understanding of use. Feel CIR recommendation remains appropriate at this time. Will continue to follow acutely to progress pt towards established OT goals.    Follow Up Recommendations  CIR;Supervision/Assistance - 24 hour    Equipment Recommendations  Other (comment)(TBD in next venue )          Precautions / Restrictions Precautions Precautions: Fall                                                                                           ADL either performed or assessed with clinical judgement   ADL Overall ADL's : Needs assistance/impaired Eating/Feeding: Minimal assistance;With adaptive utensils;Bed level Eating/Feeding Details (indicate cue type and reason): issued pt built up handle for use with self-feeding/grooming with pt demonstrating able to use and reporting understanding of use                                    General ADL Comments: pt declining EOB/OOB tx this session, agreeable to bed level ther ex; AE issued for self-feeding                        Cognition Arousal/Alertness: Awake/alert Behavior During Therapy: WFL for tasks assessed/performed Overall Cognitive Status: Within Functional Limits for tasks assessed                                           Exercises Other Exercises Other Exercises: A/AAROM to bil LE x7-10 reps, emphasis on stretching hip ER and abd to get into good anatomical alignment that pt can use to stand. Other Exercises: A/AAROM to bil elbow and shoulder with min resistance incorporated for increased strengthening/stretching/ROM, for 5-10x each                 Pertinent Vitals/ Pain       Pain Assessment: Faces Faces Pain Scale: Hurts even more Pain Location: L wrist; B LE Pain Descriptors / Indicators: Discomfort;Constant;Grimacing Pain Intervention(s): Limited activity within patient's tolerance                                                          Frequency  Min 2X/week  Progress Toward Goals  OT Goals(current goals can now be found in the care plan section)  Progress towards OT goals: Progressing toward goals  Acute Rehab OT Goals Patient Stated Goal: To walk out of the hospital OT Goal Formulation: With patient Time For Goal Achievement: 05/19/17 Potential to Achieve Goals: Good  Plan Discharge plan remains appropriate    Co-evaluation    PT/OT/SLP Co-Evaluation/Treatment: Yes Reason for Co-Treatment: Complexity of the patient's impairments (multi-system involvement) PT goals addressed during session: Mobility/safety with mobility OT goals addressed during session: ADL's and self-care      AM-PAC PT "6 Clicks" Daily Activity     Outcome Measure   Help from another person eating meals?: A Little Help from another person taking care of personal grooming?: A Lot Help from another person toileting, which includes using toliet, bedpan, or urinal?: Total Help from another person bathing (including washing, rinsing, drying)?: A Lot Help from another person to put on and taking off regular upper body clothing?: A Lot Help from another person to put on and taking off regular lower body clothing?: Total 6 Click Score: 11    End of Session    OT Visit  Diagnosis: Other abnormalities of gait and mobility (R26.89);Muscle weakness (generalized) (M62.81);Pain Pain - Right/Left: Left Pain - part of body: (wrist, bil LEs )   Activity Tolerance Patient tolerated treatment well   Patient Left in bed;with call bell/phone within reach   Nurse Communication Mobility status        Time: 9292-4462 OT Time Calculation (min): 24 min  Charges: OT General Charges $OT Visit: 1 Visit OT Treatments $Therapeutic Activity: 8-22 mins  Lou Cal, OT Pager [phone] 05/07/2017    [name] 05/07/2017, 4:58 PM

## 2017-05-07 NOTE — Progress Notes (Signed)
PROGRESS NOTE    John Bailey  IFO:[id number] DOB: [birth year] DOA: 04/22/2017 PCP: [name], MD   Chief Complaint  Patient presents with  . Generalized body swelling    Brief Narrative:  HPI on 04/22/2017 by Dr. Quintella Baton This is a 47 year old gentleman who presents with complaints of weight gain, increasing edema and difficulty walking as a result.  This is a patient with rheumatoid arthritis, diabetes mellitus, chronic fluid overload.  He was started on prednisone approximately 9 weeks ago to treat his rheumatoid arthritis.  At that time his Lasix was continued.  He was maintained on prednisone for 6 weeks.  His Lasix was restarted approximately 3 weeks ago.  With his Lasix being held he has gained weight, he states approximately 15 pounds.  Despite being restarted on his Lasix he is not lost any weight to the point now it has become difficult for him to walk.  He saw his PCP today and was sent him to the ER.  The patient denies any cough, wheeze or chest pains.  He does report increased shortness of breath with activity.  History provided by the patient.  The ER the patient is also hypoxic with ambulation, his oxygen sats going down to 80% with ambulation.  Interim history  Admitted for Acute CHF. Was placed on IV Lasix however this was discontinued as patient developed worsening renal failure.  Nephrology consulted and appreciated, patient started dialysis.  Assessment & Plan   Acute on chronic diastolic heart failure -Echocardiogram showed an EF of 55-60% with severe LVH, dilated left atrium and trivial pericardial effusion.  Unable to assess diastolic dysfunction. -Patient was started on IV Lasix however this was discontinued due to poor urine output and worsening creatinine  -Nephrology was consulted and appreciated, patient started dialysis -Vascular surgery consulted and appreciated, for permanent HD access placement -6L removed with HD on 2/20 -stockings in place of  UNNA boots  Anasarca -Secondary to volume overload  -continue management with dialysis  Rheumatoid arthritis with flare -Patient receives Abatacept every Tuesday -knee x-rays negative for knee effusion -Continue methocarbamol, gabapentin -Dr. Alfredia Ferguson, previous hospitalist discussed with Dr. 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  -Discussed with Dr. Donnetta Hutching, vascular surgery, possible placement of permanent access next week -discussed with Dr. Marylou Flesher, nephrology, feels this is now ESRD, CLIP in process   Hypertension -Blood pressure currently stable -Cardiology, Dr. Caryl Comes checking to rule out amyloid given the degree of LVH -Immunoelectrophoresis to check for amyloid however unable to collect 24-hour urine due to worsening renal failure  Hyperkalemia/hypokalemia -K 2.6 today, ordered replacement -Continue to monitor BMP  Leukocytosis -Possibly secondary to RA flare versus steroids -Previous hospitalist discussed with infectious disease, Dr. Drucilla Schmidt who believes WBC to be related to RA instead of infection  Diabetes mellitus, type II -Continue Lantus and insulin sliding scale with CBG  monitoring  Hyperphosphatemia  -continue dialysis management and monitor  Diarrhea -C. difficile checked and was unremarkable, GI pathogen panel negative -Continue to monitor  Physical deconditioning -PT/OT consulted and PT recommended SNF, OT recommended CIR -CIR consulted- potential candidate -Social work consulted  DVT Prophylaxis heparin  Code Status: Full  Family Communication: none at bedside  Disposition Plan: Admitted. Pending CLIP process and possible access placement next week  Consultants Cardiology/heart failure team Nephrology  Interventional radiology Infectious disease via phone Rheumatology via phone Vascular surgery Inpatient rehab  Procedures  Echocardiogram Right upper extremity vein mapping RIJ TDC, RIJ vein cannulation under [place], removal of RIJ vein temp dialysis catheter   Antibiotics   Anti-infectives (From admission, onward)   Start     Dose/Rate Route Frequency Ordered Stop   05/04/17 1215  ceFAZolin (ANCEF) 3 g in dextrose 5 % 50 mL IVPB    Comments:  Send with pt to OR   3 g 130 mL/hr over 30 Minutes Intravenous To Surgery 05/04/17 1202 05/04/17 1450   05/04/17 0600  ceFAZolin (ANCEF) IVPB 1 g/50 mL premix  Status:  Discontinued    Comments:  Send with pt to OR   1 g 100 mL/hr over 30 Minutes Intravenous To Surgery 05/03/17 1606 05/04/17 1202      Subjective:   John Bailey seen and examined today in dialysis.  Patient feeling better today.  Denies current chest pain, shortness breath, abdominal pain, nausea or vomiting, diarrhea constipation.  Feels his swelling has improved.  Objective:   Vitals:   05/07/17 1100 05/07/17 1130 05/07/17 1200 05/07/17 1613  BP: 124/62 128/69 137/72 114/78  Pulse: 80 82 83 90  Resp: 19 18 19  (!) 27  Temp:    99.3 F (37.4 C)  TempSrc:   Oral Oral  SpO2:   100% 98%  Weight:   (!) 137.5 kg (303 lb 2.1 oz)   Height:        Intake/Output Summary (Last 24 hours) at 05/07/2017 1633 Last data  filed at 05/07/2017 1303 Gross per 24 hour  Intake 978.89 ml  Output 5000 ml  Net -4021.11 ml   Filed Weights   05/07/17 0645 05/07/17 0753 05/07/17 1200  Weight: (!) 146.5 kg (322 lb 14.4 oz) (!) 142.2 kg (313 lb 7.9 oz) (!) 137.5 kg (303 lb 2.1 oz)   Exam  General: Well developed, well nourished, NAD, appears stated age  45: NCAT, mucous membranes moist.   Neck: Supple  Cardiovascular: S1 S2 auscultated, RRR, no murmurs  Respiratory: Clear to auscultation bilaterally with equal chest rise  Abdomen: Soft, obese, nontender, nondistended, + bowel sounds  Extremities: warm dry without cyanosis clubbing. +LE edema b/l   Neuro: AAOx3, nonfocal  Psych: Appropriate mood and affect, pleasant   Data Reviewed: I have personally reviewed following labs and imaging studies  CBC: Recent Labs  Lab 05/01/17 0528 05/02/17 0750 05/03/17 0838 05/04/17 0540 05/05/17 0430 05/06/17 0745 05/07/17 0516  WBC 24.9* 27.0* 27.6* 33.3* 26.8* 33.1* 27.1*  NEUTROABS 21.0* 22.1* 25.1* 30.6* 23.0*  --   --   HGB 9.5* 9.8* 9.8* 9.7* 9.8* 10.2* 9.8*  HCT 28.2* 29.4* 29.2* 29.1* 29.2* 30.6* 29.4*  MCV 82.7 83.3 83.4 83.4 83.4 84.1 84.5  PLT 466* 503* 484* 539* 530* 545* 454*   Basic Metabolic Panel: Recent Labs  Lab 05/01/17 0528 05/02/17 0750 05/03/17 0838 05/04/17 0540 05/05/17 0430 05/05/17 1701 05/06/17 0745 05/07/17 0516  NA 131*  131* 131* 129* 131* 133* 134* 133* 135  K 3.6  3.6 3.5 3.1* 3.5 3.1* 3.4* 3.1* 2.6*  CL 94*  94* 95* 94* 94* 97* 98* 97* 99*  CO2 22  22 22  21* 21* 21* 22 21* 22  GLUCOSE 177*  183* 139* 207* 217* 138* 154* 183* 127*  BUN 60*  61* 56* 60* 58* 50* 31* 37* 25*  CREATININE 4.40*  4.40* 4.72* 5.38* 5.58* 5.42* 4.09* 4.84* 4.33*  CALCIUM 8.6*  8.6* 8.3* 8.3* 8.5* 8.3* 8.2* 8.4* 8.3*  MG 2.3 2.3 2.3 2.3 2.2  --   --   --   PHOS 7.1*  7.1* 5.8* 6.5* 6.9* 6.1* 3.6  --   --    GFR: Estimated Creatinine Clearance: 31.3 mL/min (A) (by C-G formula  based on SCr of 4.33 mg/dL (H)). Liver Function Tests: Recent Labs  Lab 05/01/17 0528 05/02/17 0750 05/03/17 0838 05/04/17 0540 05/05/17 0430 05/05/17 1701  AST 10* 11* 10* 12* 12*  --   ALT 7* <5* 7* 8* 7*  --   ALKPHOS 80 83 80 70 74  --   BILITOT 0.8 0.6 0.5 0.6 0.6  --   PROT 6.2* 6.2* 5.7* 6.1* 5.9*  --   ALBUMIN 2.0*  2.0* 2.2* 2.3* 2.3* 2.4* 2.5*   No results for input(s): LIPASE, AMYLASE in the last 168 hours. No results for input(s): AMMONIA in the last 168 hours. Coagulation Profile: Recent Labs  Lab 05/04/17 0540  INR 1.34   Cardiac Enzymes: No results for input(s): CKTOTAL, CKMB, CKMBINDEX, TROPONINI in the last 168 hours. BNP (last 3 results) No results for input(s): PROBNP in the last 8760 hours. HbA1C: No results for input(s): HGBA1C in the last 72 hours. CBG: Recent Labs  Lab 05/06/17 0744 05/06/17 1642 05/06/17 2155 05/07/17 1508 05/07/17 1612  GLUCAP 167* 107* 104* 196* 211*   Lipid Profile: No results for input(s): CHOL, HDL, LDLCALC, TRIG, CHOLHDL, LDLDIRECT in the last 72 hours. Thyroid Function Tests: No results for input(s): TSH, T4TOTAL, FREET4, T3FREE, THYROIDAB in the last 72 hours. Anemia Panel: No results for input(s): VITAMINB12, FOLATE, FERRITIN, TIBC, IRON, RETICCTPCT in the last 72 hours. Urine analysis:    Component Value Date/Time   COLORURINE YELLOW 04/25/2017 2057   APPEARANCEUR CLEAR 04/25/2017 2057   LABSPEC 1.010 04/25/2017 2057   PHURINE 5.0 04/25/2017 2057   GLUCOSEU NEGATIVE 04/25/2017 2057   HGBUR NEGATIVE 04/25/2017 2057   BILIRUBINUR NEGATIVE 04/25/2017 2057   KETONESUR NEGATIVE 04/25/2017 2057   PROTEINUR NEGATIVE 04/25/2017 2057   NITRITE NEGATIVE 04/25/2017 2057   LEUKOCYTESUR NEGATIVE 04/25/2017 2057   Sepsis Labs: @LABRCNTIP (procalcitonin:4,lacticidven:4)  ) Recent Results (from the past 240 hour(s))  Gastrointestinal Panel by PCR , Stool     Status: None   Collection Time: 05/02/17  2:42 PM   Result Value Ref Range Status   Campylobacter species NOT DETECTED NOT DETECTED Final   Plesimonas shigelloides NOT DETECTED NOT DETECTED Final   Salmonella species NOT DETECTED NOT DETECTED Final   Yersinia enterocolitica NOT DETECTED NOT DETECTED Final   Vibrio species NOT DETECTED NOT DETECTED Final   Vibrio cholerae NOT DETECTED NOT DETECTED Final   Enteroaggregative E coli (EAEC) NOT DETECTED NOT DETECTED Final   Enteropathogenic E coli (EPEC) NOT DETECTED NOT DETECTED Final   Enterotoxigenic E coli (ETEC) NOT DETECTED NOT DETECTED Final   Shiga like toxin producing E coli (STEC) NOT DETECTED NOT DETECTED Final   Shigella/Enteroinvasive E coli (EIEC) NOT DETECTED NOT DETECTED Final   Cryptosporidium NOT DETECTED NOT DETECTED  Final   Cyclospora cayetanensis NOT DETECTED NOT DETECTED Final   Entamoeba histolytica NOT DETECTED NOT DETECTED Final   Giardia lamblia NOT DETECTED NOT DETECTED Final   Adenovirus F40/41 NOT DETECTED NOT DETECTED Final   Astrovirus NOT DETECTED NOT DETECTED Final   Norovirus GI/GII NOT DETECTED NOT DETECTED Final   Rotavirus A NOT DETECTED NOT DETECTED Final   Sapovirus (I, II, IV, and V) NOT DETECTED NOT DETECTED Final    Comment: Performed at [facility], [address] [place], [place] [address]  C difficile quick scan w PCR reflex     Status: None   Collection Time: 05/02/17  2:42 PM  Result Value Ref Range Status   C Diff antigen NEGATIVE NEGATIVE Final   C Diff toxin NEGATIVE NEGATIVE Final   C Diff interpretation No C. difficile detected.  Final    Comment: Performed at [facility], [place] [address], [place], [place] [address]  Surgical pcr screen     Status: None   Collection Time: 05/04/17  6:53 AM  Result Value Ref Range Status   MRSA, PCR NEGATIVE NEGATIVE Final   Staphylococcus aureus NEGATIVE NEGATIVE Final    Comment: (NOTE) The Xpert SA Assay (FDA approved for NASAL specimens in patients 55 years of age and older), is  one component of a comprehensive surveillance program. It is not intended to diagnose infection nor to guide or monitor treatment. Performed at [facility], [place] [address], [place], [place] [address]       Radiology Studies: Dg Chest Port 1 View  Result Date: 05/04/2017 CLINICAL DATA:  Dialysis catheter placement. EXAM: PORTABLE CHEST 1 VIEW COMPARISON:  04/29/2017. FINDINGS: Right IJ dual-lumen catheter noted with tip at the cavoatrial junction. Cardiomegaly with mild pulmonary vascular prominence. No focal infiltrate. No pleural effusion or pneumothorax. IMPRESSION: 1. Right IJ dual-lumen catheter noted with tip at the cavoatrial junction. 2.  Cardiomegaly with mild pulmonary venous congestion. Electronically Signed   By: Marcello Moores  Register   On: 05/04/2017 16:23   Dg Fluoro Guide Cv Line-no Report  Result Date: 05/04/2017 Fluoroscopy was utilized by the requesting physician.  No radiographic interpretation.   Vas [place] Upper Extremity Arterial Duplex  Result Date: 05/03/2017 UPPER EXTREMITY DUPLEX STUDY Indications: Pre-op for AVF. Examination Guidelines: A complete evaluation includes B-mode imaging, spectral doppler, color doppler, and power doppler as needed of all accessible portions of each vessel. Bilateral testing is considered an integral part of a complete examination. Limited examinations for reoccurring indications may be performed as noted.  Right Pre-Dialysis Findings: +-----------------+----------+------------------+---------+--------------------+ Location         PSV (cm/s)Intralum. Diam.   Waveform Comments                                        (mm)                                            +-----------------+----------+------------------+---------+--------------------+ Brachial Antecub.50                          triphasicproximal upper arm   fossa                                                                       +-----------------+----------+------------------+---------+--------------------+  Radial Art at    51        2.60              biphasic high bifurcation in  Wrist                                                 the upper arm        +-----------------+----------+------------------+---------+--------------------+ Ulnar Art at     44        3.00              biphasic high bifurcation in  Wrist                                                 the upper arm        +-----------------+----------+------------------+---------+--------------------+ Left Pre-Dialysis Findings: +-----------------------+----------+--------------------+---------+--------+ Location               PSV (cm/s)Intralum. Diam. (mm)Waveform Comments +-----------------------+----------+--------------------+---------+--------+ Brachial Antecub. fossa47        6.20                triphasic         +-----------------------+----------+--------------------+---------+--------+ Radial Art at Wrist    67        3.20                                  +-----------------------+----------+--------------------+---------+--------+ Ulnar Art at Wrist     73        4.70                triphasic         +-----------------------+----------+--------------------+---------+--------+ Final Interpretation:  Right: No obstruction visualized in the right upper extremity. Left: No obstruction visualized in the left upper extremity. *See table(s) above for measurements and observations. Electronically signed by Curt Jews on 05/03/2017 at 7:11:24 PM.   Vas [place] Upper Ext Vein Mapping (pre-op Avf)  Result Date: 05/03/2017 UPPER EXTREMITY VEIN MAPPING History: Pre-access. Examination Guidelines: A complete evaluation includes B-mode imaging, spectral doppler, color doppler, and power doppler as needed of all accessible portions of each vessel. Bilateral testing is considered an integral part of a complete examination. Limited examinations  for reoccurring indications may be performed as noted. +-----------------+-------------+----------+---------+ Right Cephalic   Diameter (mm)Depth (mm)Findings  +-----------------+-------------+----------+---------+ Shoulder             3.80        6.30             +-----------------+-------------+----------+---------+ Prox upper arm       3.10       11.80             +-----------------+-------------+----------+---------+ Mid upper arm                           bandages  +-----------------+-------------+----------+---------+ Dist upper arm       2.40        4.60             +-----------------+-------------+----------+---------+ Antecubital fossa    1.70        9.40             +-----------------+-------------+----------+---------+  Prox forearm         3.80        9.60             +-----------------+-------------+----------+---------+ Mid forearm          2.90        9.40   branching +-----------------+-------------+----------+---------+ Dist forearm         3.20        2.80             +-----------------+-------------+----------+---------+ Wrist                1.90        2.70             +-----------------+-------------+----------+---------+ +-----------------+-------------+----------+--------------+ Right Basilic    Diameter (mm)Depth (mm)   Findings    +-----------------+-------------+----------+--------------+ Shoulder                                not visualized +-----------------+-------------+----------+--------------+ Prox upper arm                          not visualized +-----------------+-------------+----------+--------------+ Mid upper arm                           not visualized +-----------------+-------------+----------+--------------+ Dist upper arm                          not visualized +-----------------+-------------+----------+--------------+ Antecubital fossa                       not visualized  +-----------------+-------------+----------+--------------+ Prox forearm         2.10                              +-----------------+-------------+----------+--------------+ Mid forearm          1.50                              +-----------------+-------------+----------+--------------+ Distal forearm       1.60                              +-----------------+-------------+----------+--------------+ Wrist                1.40                              +-----------------+-------------+----------+--------------+ +-----------------+-------------+----------+---------+ Left Cephalic    Diameter (mm)Depth (mm)Findings  +-----------------+-------------+----------+---------+ Shoulder             3.10       20.00             +-----------------+-------------+----------+---------+ Prox upper arm       3.60       13.50             +-----------------+-------------+----------+---------+ Mid upper arm        2.90       10.00             +-----------------+-------------+----------+---------+ Dist upper arm       2.70       10.00             +-----------------+-------------+----------+---------+  Antecubital fossa    3.70        3.00             +-----------------+-------------+----------+---------+ Prox forearm         2.80        8.50   branching +-----------------+-------------+----------+---------+ Mid forearm          2.90        4.90             +-----------------+-------------+----------+---------+ Dist forearm         2.80        4.20             +-----------------+-------------+----------+---------+ Wrist                3.50        4.10             +-----------------+-------------+----------+---------+ +-----------------+-------------+----------+--------------+ Left Basilic     Diameter (mm)Depth (mm)   Findings    +-----------------+-------------+----------+--------------+ Shoulder                                not visualized  +-----------------+-------------+----------+--------------+ Prox upper arm                          not visualized +-----------------+-------------+----------+--------------+ Mid upper arm                           not visualized +-----------------+-------------+----------+--------------+ Dist upper arm                          not visualized +-----------------+-------------+----------+--------------+ Antecubital fossa    3.40                              +-----------------+-------------+----------+--------------+ Prox forearm         4.00                              +-----------------+-------------+----------+--------------+ Mid forearm          1.40                              +-----------------+-------------+----------+--------------+ Distal forearm                          not visualized +-----------------+-------------+----------+--------------+ Elbow                                   not visualized +-----------------+-------------+----------+--------------+ Wrist                                   not visualized +-----------------+-------------+----------+--------------+ Final Interpretation: *See table(s) above for measurements and observations.  Curt Jews Electronically signed by Curt Jews on 05/03/2017 at 7:10:36 PM.      Scheduled Meds: . amiodarone  200 mg Oral BID  . carvedilol  18.75 mg Oral BID WC  . colchicine  0.3 mg Oral Daily  . fluticasone  2 spray Each Nare Daily  . gabapentin  300 mg Oral Daily  . insulin aspart  0-15 Units Subcutaneous TID WC  . insulin aspart  0-5 Units Subcutaneous QHS  . insulin glargine  25 Units Subcutaneous QHS  . [START ON 05/08/2017] predniSONE  30 mg Oral Q breakfast  . sodium chloride flush  3 mL Intravenous Q12H  . sodium chloride flush  3 mL Intravenous Q12H   Continuous Infusions: . sodium chloride    . sodium chloride    . sodium chloride 10 mL/hr at 05/04/17 1220  . heparin 1,900 Units/hr  (05/07/17 1515)     LOS: 15 days   Time Spent in minutes   30 minutes  Odean Fester D.O. on 05/07/2017 at 4:33 PM  Between 7am to 7pm - Pager - [phone]  After 7pm go to [email or web address] - password [email or web address]  And look for the night coverage person covering for me after hours  [facility] Office  [phone]

## 2017-05-07 NOTE — Progress Notes (Signed)
Inpatient Rehabilitation  Met with patient at bedside to discuss team's recommendation for IP Rehab.  Shared booklets, insurance verification letter, and answered initial questions.  Plan to follow for timing of medical readiness, therapy tolerance, insurance authorization, and IP Rehab bed availability.  Call if questions.    Melissa Bowie, M.A., CCC/SLP Admission Coordinator  [facility]  Cell [phone]  

## 2017-05-07 NOTE — Progress Notes (Signed)
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 amiodarone  400 mg Oral BID  . carvedilol  18.75 mg Oral BID WC  . colchicine  0.3 mg Oral Daily  . fluticasone  2 spray Each Nare Daily  . gabapentin  300 mg Oral Daily  . insulin aspart  0-15 Units Subcutaneous TID WC  . insulin aspart  0-5 Units Subcutaneous QHS  . insulin glargine  25 Units Subcutaneous QHS  . potassium chloride  40 mEq Oral Once  . predniSONE  40 mg Oral Q breakfast  . sodium chloride flush  3 mL Intravenous Q12H  . sodium chloride flush  3 mL Intravenous Q12H   Continuous Infusions: . sodium chloride    . sodium chloride    . sodium chloride 10 mL/hr at 05/04/17 1220  . heparin 2,100 Units/hr (05/07/17 0139)   PRN Meds:.sodium chloride, acetaminophen, methocarbamol, ondansetron (ZOFRAN) IV, sodium chloride flush, sodium chloride flush, sodium chloride flush  Current Labs: reviewed    Physical Exam:  Blood pressure 130/72, pulse 77, temperature 99.5 F (37.5 C), temperature source Oral, resp. rate (!) 28, height 6' (1.829 m), weight (!) 142.2 kg (313 lb 7.9 oz), SpO2 97 %. RRR NAD on HD 1+ edema in legs now R IJ TDC in place S/nt/nd Diminshed BS in bases  A 1. Progressive CKD4, now ESRD; CLIP in process, has [facility] will need attempt at AV access and [facility] is following 2. Anemia:  3. HTN: stable BPs 4. Afib s/p DCCV in NSR on heparin gtt 5. Leukocytosis 6. Deconditioning perhaps for CIR  P 1. HD again tomorrow, back off on UF goals 2. Check Iron panel and PTH  with HD tomorrow 3. F/u CLIP   Pearson Grippe MD 05/07/2017, 9:13 AM  Recent Labs  Lab 05/04/17 0540 05/05/17 0430 05/05/17 1701 05/06/17 0745 05/07/17 0516  NA 131* 133* 134* 133* 135  K 3.5 3.1* 3.4* 3.1* 2.6*  CL 94* 97* 98* 97* 99*  CO2 21* 21* 22 21* 22  GLUCOSE 217* 138* 154* 183* 127*  BUN 58* 50* 31* 37* 25*  CREATININE 5.58* 5.42* 4.09* 4.84* 4.33*  CALCIUM 8.5* 8.3* 8.2* 8.4* 8.3*  PHOS 6.9* 6.1* 3.6  --   --    Recent Labs  Lab 05/03/17 0838 05/04/17 0540 05/05/17 0430 05/06/17 0745 05/07/17 0516  WBC 27.6* 33.3* 26.8* 33.1* 27.1*  NEUTROABS 25.1* 30.6* 23.0*  --   --   HGB 9.8* 9.7* 9.8* 10.2* 9.8*  HCT 29.2* 29.1* 29.2* 30.6* 29.4*  MCV 83.4 83.4 83.4 84.1 84.5  PLT 484* 539* 530* 545* 484*

## 2017-05-07 NOTE — Progress Notes (Signed)
Critical K of 2.6 received from Lab.  Dr. Ree Kida aware.  Pt going to HD this am.  HD RN also made aware.

## 2017-05-07 NOTE — Procedures (Signed)
I was present at this dialysis session. I have reviewed the session itself and made appropriate changes.   Having some cramping after HD.  UF goal 5L today. Much less edema, now with compression hose.    CLIP in process  Working with PT; likely to go to CIR  AM weight 313lb   K 2.6, on 4K bath.  Check PM K, replete if necessary  Plan for HD again tomorrow then break until Mon or Tues    Filed Weights   05/06/17 1300 05/07/17 0645 05/07/17 0753  Weight: (!) 145.4 kg (320 lb 8.8 oz) (!) 146.5 kg (322 lb 14.4 oz) (!) 142.2 kg (313 lb 7.9 oz)    Recent Labs  Lab 05/05/17 1701  05/07/17 0516  NA 134*   < > 135  K 3.4*   < > 2.6*  CL 98*   < > 99*  CO2 22   < > 22  GLUCOSE 154*   < > 127*  BUN 31*   < > 25*  CREATININE 4.09*   < > 4.33*  CALCIUM 8.2*   < > 8.3*  PHOS 3.6  --   --    < > = values in this interval not displayed.    Recent Labs  Lab 05/03/17 0838 05/04/17 0540 05/05/17 0430 05/06/17 0745 05/07/17 0516  WBC 27.6* 33.3* 26.8* 33.1* 27.1*  NEUTROABS 25.1* 30.6* 23.0*  --   --   HGB 9.8* 9.7* 9.8* 10.2* 9.8*  HCT 29.2* 29.1* 29.2* 30.6* 29.4*  MCV 83.4 83.4 83.4 84.1 84.5  PLT 484* 539* 530* 545* 484*    Scheduled Meds: . amiodarone  400 mg Oral BID  . carvedilol  18.75 mg Oral BID WC  . colchicine  0.3 mg Oral Daily  . fluticasone  2 spray Each Nare Daily  . gabapentin  300 mg Oral Daily  . insulin aspart  0-15 Units Subcutaneous TID WC  . insulin aspart  0-5 Units Subcutaneous QHS  . insulin glargine  25 Units Subcutaneous QHS  . potassium chloride  40 mEq Oral Once  . predniSONE  40 mg Oral Q breakfast  . sodium chloride flush  3 mL Intravenous Q12H  . sodium chloride flush  3 mL Intravenous Q12H   Continuous Infusions: . sodium chloride    . sodium chloride    . sodium chloride 10 mL/hr at 05/04/17 1220  . heparin 2,100 Units/hr (05/07/17 0139)   PRN Meds:.sodium chloride, acetaminophen, methocarbamol, ondansetron (ZOFRAN) IV, sodium  chloride flush, sodium chloride flush, sodium chloride flush   Pearson Grippe  MD 05/07/2017, 9:13 AM

## 2017-05-07 NOTE — Progress Notes (Signed)
Patient ID: John Bailey, male   DOB: 09-02-1970, 47 y.o.   MRN: [id number] PP    Advanced Heart Failure Rounding Note  PCP-Cardiologist: Pixie Casino, MD  HF Cardiologist: [name], MD  Subjective:    DCCV on 2/15, he remains in NSR. Now on PO amio and heparin gtt.  Tunneled HD cath placed 2/19  Echo (04/25/17): EF 55-60%, severe LVH.   HD today with ongoing fluid removal, weight down > 50 lbs.    Still having knee pain and not very mobile.  Denies dyspnea.   Objective:   Weight Range: (!) 303 lb 2.1 oz (137.5 kg) Body mass index is 41.11 kg/m.   Vital Signs:   Temp:  [97.8 F (36.6 C)-100.1 F (37.8 C)] 99.5 F (37.5 C) (02/22 0753) Pulse Rate:  [75-83] 83 (02/22 1200) Resp:  [16-28] 19 (02/22 1200) BP: (75-137)/(42-75) 137/72 (02/22 1200) SpO2:  [95 %-100 %] 100 % (02/22 1200) Weight:  [303 lb 2.1 oz (137.5 kg)-322 lb 14.4 oz (146.5 kg)] 303 lb 2.1 oz (137.5 kg) (02/22 1200) Last BM Date: 05/07/17  Weight change: Filed Weights   05/07/17 0645 05/07/17 0753 05/07/17 1200  Weight: (!) 322 lb 14.4 oz (146.5 kg) (!) 313 lb 7.9 oz (142.2 kg) (!) 303 lb 2.1 oz (137.5 kg)   Intake/Output:   Intake/Output Summary (Last 24 hours) at 05/07/2017 1353 Last data filed at 05/07/2017 1303 Gross per 24 hour  Intake 1098.89 ml  Output 5000 ml  Net -3901.11 ml     Physical Exam   General: NAD Neck: Thick, JVP 12 cm, no thyromegaly or thyroid nodule.  Lungs: Decreased breath sounds at bases bilaterally.  CV: Nonpalpable PMI.  Heart regular S1/S2, no S3/S4, no murmur.  1+ edema into thighs (improved).   Abdomen: Soft, nontender, no hepatosplenomegaly, no distention.  Skin: Intact without lesions or rashes.  Neurologic: Alert and oriented x 3.  Psych: Normal affect. Extremities: No clubbing or cyanosis.  HEENT: Normal.    Telemetry  NSR 80s (personally reviewed)  Labs    CBC Recent Labs    05/05/17 0430 05/06/17 0745 05/07/17 0516  WBC 26.8* 33.1*  27.1*  NEUTROABS 23.0*  --   --   HGB 9.8* 10.2* 9.8*  HCT 29.2* 30.6* 29.4*  MCV 83.4 84.1 84.5  PLT 530* 545* 374*   Basic Metabolic Panel Recent Labs    05/05/17 0430 05/05/17 1701 05/06/17 0745 05/07/17 0516  NA 133* 134* 133* 135  K 3.1* 3.4* 3.1* 2.6*  CL 97* 98* 97* 99*  CO2 21* 22 21* 22  GLUCOSE 138* 154* 183* 127*  BUN 50* 31* 37* 25*  CREATININE 5.42* 4.09* 4.84* 4.33*  CALCIUM 8.3* 8.2* 8.4* 8.3*  MG 2.2  --   --   --   PHOS 6.1* 3.6  --   --    Liver Function Tests Recent Labs    05/05/17 0430 05/05/17 1701  AST 12*  --   ALT 7*  --   ALKPHOS 74  --   BILITOT 0.6  --   PROT 5.9*  --   ALBUMIN 2.4* 2.5*   No results for input(s): LIPASE, AMYLASE in the last 72 hours. Cardiac Enzymes No results for input(s): CKTOTAL, CKMB, CKMBINDEX, TROPONINI in the last 72 hours.  BNP: BNP (last 3 results) Recent Labs    03/06/17 1800 04/22/17 1734 04/23/17 0736  BNP 791.5* 531.3* 627.9*    ProBNP (last 3 results) No results for input(s): PROBNP  in the last 8760 hours.   D-Dimer No results for input(s): DDIMER in the last 72 hours. Hemoglobin A1C No results for input(s): HGBA1C in the last 72 hours. Fasting Lipid Panel No results for input(s): CHOL, HDL, LDLCALC, TRIG, CHOLHDL, LDLDIRECT in the last 72 hours. Thyroid Function Tests No results for input(s): TSH, T4TOTAL, T3FREE, THYROIDAB in the last 72 hours.  Invalid input(s): FREET3  Other results:   Imaging       Medications:     Scheduled Medications: . amiodarone  200 mg Oral BID  . carvedilol  18.75 mg Oral BID WC  . colchicine  0.3 mg Oral Daily  . fluticasone  2 spray Each Nare Daily  . gabapentin  300 mg Oral Daily  . insulin aspart  0-15 Units Subcutaneous TID WC  . insulin aspart  0-5 Units Subcutaneous QHS  . insulin glargine  25 Units Subcutaneous QHS  . predniSONE  40 mg Oral Q breakfast  . sodium chloride flush  3 mL Intravenous Q12H  . sodium chloride flush  3 mL  Intravenous Q12H    Infusions: . sodium chloride    . sodium chloride    . sodium chloride 10 mL/hr at 05/04/17 1220  . heparin 1,900 Units/hr (05/07/17 1304)    PRN Medications: sodium chloride, acetaminophen, methocarbamol, ondansetron (ZOFRAN) IV, sodium chloride flush, sodium chloride flush, sodium chloride flush    Assessment/Plan   1. Atrial fibrillation with RVR: On review of prior ECGs, last time he was documented to be in NSR was in 1/18. I suspect that he may be better compensated in NSR. His HR has been difficult to control in afib.  He remains in NSR after DCCV on 2/15.   - Continue PO amiodarone, can decreased to 200 mg bid.  After 1 week, decreased to 200 mg daily.  Do not want him on amiodarone long-term, will likely try to stop amiodarone after a month or so once his volume is optimized on HD.  - Continue Coreg 18.75 mg BID - Continue heparin gtt until permanent vascular access is placed, then start Eliquis 5 mg po bid.  2. Acute on chronic diastolic CHF: Echo this admission with severe LVH, EF 55-60%. Marked volume overload with anasarca. Fluid now coming off with HD but he still has a way to go.   - Continue daily HD per renal.  No change. - With low voltage and severe LVH, sent SPEP which did not show an M-spike.  3. AKI on CKD Stage IV: I suspect that he will be dialysis dependent for volume management. Continue daily HD. Now has tunneled HD cath. Will need placement of permanent access.  4. ID: Afebrile today though WBCs still high.  Cultures negative.  Suspicion for RA flare, high ESR and CRP.  He has been started on prednisone for management of RA pain in knee which may be keeping WBCs up.  Knee pain improved but still present.  Remains afebrile.  5. HTN: Improved with volume removal. SBP 110-140's 6. Knee pain: Likely related to rheumatoid arthritis. Knee Xray with subQ edema. No acute osseous findings. Improved. 7. Deconditioning: PT/OT recommending SNF vs  CIR  We will follow at a distance, available for questions.  Continue amiodarone 200 mg bid x 1 week then decrease to 200 mg daily.  Start apixaban 5 mg bid when his permanent access has been placed, heparin gtt until then.   Length of Stay: [place], MD  05/07/2017, 1:53 PM  Advanced Heart Failure Team Pager [phone] (M-F; [place])  Please contact [facility] for night-coverage after hours (4p -7a ) and weekends on [email or web address]

## 2017-05-07 NOTE — Progress Notes (Signed)
Patient ID: John Bailey, male   DOB: 28-Oct-1970, 47 y.o.   MRN: [id number] The patient continues to diurese well with daily hemodialysis via his right IJ tunneled catheter.  Again discussed need for permanent access.  He is on heparin drip pending conversion to oral anticoagulation.  Will plan for left arm AV access on Monday his vein map showed moderate left cephalic vein.  We will plan left arm AV fistula versus AV Gore-Tex graft.  Again discussed the procedure with the patient including potential limitations of each.  Will need to have dialysis on Sunday to be ready for access on Monday

## 2017-05-07 NOTE — Progress Notes (Signed)
Physical Therapy Treatment Patient Details Name: John Bailey MRN: [id number] DOB: 1970/07/03 Today's Date: 05/07/2017    History of Present Illness John Bailey is a 47 y.o. rheumatoid arthritis, diabetes mellitus, heart failure, atrial fibrillation. Patient presented with dyspnea and found to have a CHF exacerbation    PT Comments    Pt worn out from HD and has agreed to allow there ex/ROM to uppers and Lowers with emphasis on getting legs into adequate alignment to stand.   Follow Up Recommendations  CIR;Supervision/Assistance - 24 hour     Equipment Recommendations  Other (comment)    Recommendations for Other Services       Precautions / Restrictions Precautions Precautions: Fall    Mobility  Bed Mobility                  Transfers                    Ambulation/Gait                 Stairs            Wheelchair Mobility    Modified Rankin (Stroke Patients Only)       Balance                                            Cognition Arousal/Alertness: Awake/alert Behavior During Therapy: WFL for tasks assessed/performed Overall Cognitive Status: Within Functional Limits for tasks assessed                                        Exercises Other Exercises Other Exercises: A/AAROM to bil LE x7-10 reps, emphasis on stretching hip ER and abd to get into good anatomical alignment that pt can use to stand.    General Comments        Pertinent Vitals/Pain Pain Assessment: Faces Faces Pain Scale: Hurts even more Pain Location: L wrist; B LE Pain Descriptors / Indicators: Discomfort;Constant;Grimacing Pain Intervention(s): Limited activity within patient's tolerance    Home Living                      Prior Function            PT Goals (current goals can now be found in the care plan section) Acute Rehab PT Goals Patient Stated Goal: To walk out of the hospital PT Goal  Formulation: With patient Time For Goal Achievement: 05/14/17 Potential to Achieve Goals: Fair Progress towards PT goals: Progressing toward goals;Not progressing toward goals - comment(continue with modified goals)    Frequency    Min 3X/week      PT Plan Current plan remains appropriate    Co-evaluation              AM-PAC PT "6 Clicks" Daily Activity  Outcome Measure  Difficulty turning over in bed (including adjusting bedclothes, sheets and blankets)?: Unable Difficulty moving from lying on back to sitting on the side of the bed? : Unable Difficulty sitting down on and standing up from a chair with arms (e.g., wheelchair, bedside commode, etc,.)?: Unable Help needed moving to and from a bed to chair (including a wheelchair)?: A Lot Help needed walking in hospital room?: Total Help needed climbing 3-5 steps with  a railing? : Total 6 Click Score: 7    End of Session   Activity Tolerance: Patient tolerated treatment well Patient left: in bed;with call bell/phone within reach;with bed alarm set Nurse Communication: Mobility status       Time: 8811-0315 PT Time Calculation (min) (ACUTE ONLY): 24 min  Charges:  $Therapeutic Exercise: 8-22 mins                    G Codes:       05/15/17  John Bailey, PT [phone]  (pager)   John Bailey May 15, 2017, 4:08 PM

## 2017-05-08 LAB — GLUCOSE, CAPILLARY
GLUCOSE-CAPILLARY: 188 mg/dL — AB (ref 65–99)
Glucose-Capillary: 190 mg/dL — ABNORMAL HIGH (ref 65–99)
Glucose-Capillary: 170 mg/dL — ABNORMAL HIGH (ref 65–99)
Glucose-Capillary: 196 mg/dL — ABNORMAL HIGH (ref 65–99)

## 2017-05-08 LAB — POTASSIUM: Potassium: 2.8 mmol/L — ABNORMAL LOW (ref 3.5–5.1)

## 2017-05-08 LAB — BASIC METABOLIC PANEL
Anion gap: 14 (ref 5–15)
BUN: 21 mg/dL — AB (ref 6–20)
CALCIUM: 8.3 mg/dL — AB (ref 8.9–10.3)
CO2: 23 mmol/L (ref 22–32)
Chloride: 95 mmol/L — ABNORMAL LOW (ref 101–111)
Creatinine, Ser: 4.03 mg/dL — ABNORMAL HIGH (ref 0.61–1.24)
GFR calc Af Amer: 19 mL/min — ABNORMAL LOW (ref >60)
GFR calc non Af Amer: 17 mL/min — ABNORMAL LOW (ref >60)
GLUCOSE: 231 mg/dL — AB (ref 65–99)
Potassium: 2.4 mmol/L — CL (ref 3.5–5.1)
Sodium: 132 mmol/L — ABNORMAL LOW (ref 135–145)

## 2017-05-08 LAB — CBC
HCT: 29.9 % — ABNORMAL LOW (ref 39.0–52.0)
Hemoglobin: 9.8 g/dL — ABNORMAL LOW (ref 13.0–17.0)
MCH: 27.5 pg (ref 26.0–34.0)
MCHC: 32.8 g/dL (ref 30.0–36.0)
MCV: 83.8 fL (ref 78.0–100.0)
Platelets: 463 10*3/uL — ABNORMAL HIGH (ref 150–400)
RBC: 3.57 MIL/uL — ABNORMAL LOW (ref 4.22–5.81)
RDW: 18.5 % — AB (ref 11.5–15.5)
WBC: 35 10*3/uL — ABNORMAL HIGH (ref 4.0–10.5)

## 2017-05-08 LAB — HEPARIN LEVEL (UNFRACTIONATED): HEPARIN UNFRACTIONATED: 0.65 [IU]/mL (ref 0.30–0.70)

## 2017-05-08 LAB — APTT: APTT: 99 s — AB (ref 24–36)

## 2017-05-08 MED ORDER — POTASSIUM CHLORIDE 20 MEQ PO PACK: 40.0000 meq | PACK | Freq: Once | ORAL | Status: DC

## 2017-05-08 MED ORDER — POTASSIUM CHLORIDE CRYS ER 20 MEQ PO TBCR
40.0000 meq | EXTENDED_RELEASE_TABLET | Freq: Once | ORAL | Status: AC
  Administered 2017-05-08: 40 meq via ORAL
  Filled 2017-05-08: qty 2

## 2017-05-08 NOTE — Procedures (Signed)
I was present at this dialysis session. I have reviewed the session itself and made appropriate changes.   K 2.4 this AM.  LIkely due to diarrhea.  ? If due to colchicine will hold for now.  Rec 40 PO K with HD today, on 4K.  UF goal 5L. Post weight 137.5kg yesterday.    Usually don't do HD on Sunday unless critical.  He is renally optimized for access surgery and can f/u with HD on Mon or Tues postoperative.52  Filed Weights   05/07/17 0753 05/07/17 1200 05/08/17 0416  Weight: (!) 142.2 kg (313 lb 7.9 oz) (!) 137.5 kg (303 lb 2.1 oz) (!) 141 kg (310 lb 13.6 oz)    Recent Labs  Lab 05/05/17 1701  05/08/17 0337  NA 134*   < > 132*  K 3.4*   < > 2.4*  CL 98*   < > 95*  CO2 22   < > 23  GLUCOSE 154*   < > 231*  BUN 31*   < > 21*  CREATININE 4.09*   < > 4.03*  CALCIUM 8.2*   < > 8.3*  PHOS 3.6  --   --    < > = values in this interval not displayed.    Recent Labs  Lab 05/03/17 0838 05/04/17 0540 05/05/17 0430 05/06/17 0745 05/07/17 0516 05/08/17 0337  WBC 27.6* 33.3* 26.8* 33.1* 27.1* 35.0*  NEUTROABS 25.1* 30.6* 23.0*  --   --   --   HGB 9.8* 9.7* 9.8* 10.2* 9.8* 9.8*  HCT 29.2* 29.1* 29.2* 30.6* 29.4* 29.9*  MCV 83.4 83.4 83.4 84.1 84.5 83.8  PLT 484* 539* 530* 545* 484* 463*    Scheduled Meds: . amiodarone  200 mg Oral BID  . carvedilol  18.75 mg Oral BID WC  . colchicine  0.3 mg Oral Daily  . fluticasone  2 spray Each Nare Daily  . gabapentin  300 mg Oral Daily  . insulin aspart  0-15 Units Subcutaneous TID WC  . insulin aspart  0-5 Units Subcutaneous QHS  . insulin glargine  25 Units Subcutaneous QHS  . predniSONE  30 mg Oral Q breakfast  . sodium chloride flush  3 mL Intravenous Q12H  . sodium chloride flush  3 mL Intravenous Q12H   Continuous Infusions: . sodium chloride    . sodium chloride    . sodium chloride 10 mL/hr at 05/04/17 1220  . heparin 1,900 Units/hr (05/08/17 0600)   PRN Meds:.sodium chloride, acetaminophen, hyoscyamine, methocarbamol,  ondansetron (ZOFRAN) IV, sodium chloride flush, sodium chloride flush, sodium chloride flush   Pearson Grippe  MD 05/08/2017, 8:20 AM

## 2017-05-08 NOTE — Progress Notes (Signed)
ANTICOAGULATION CONSULT NOTE  Pharmacy Consult for heparin Indication: atrial fibrillation  No Known Allergies  Patient Measurements: Height: 6' (182.9 cm) Weight: (!) 303 lb 2.1 oz (137.5 kg) IBW/kg (Calculated) : 77.6  Heparin dosing weight: 111 kg  Vital Signs: Temp: 98.7 F (37.1 C) (02/23 0004) Temp Source: Oral (02/23 0004) BP: 112/74 (02/23 0004) Pulse Rate: 81 (02/23 0004)  Labs: Recent Labs    05/05/17 0430 05/05/17 1701 05/06/17 0745 05/06/17 2239 05/07/17 0516 05/07/17 2215  HGB 9.8*  --  10.2*  --  9.8*  --   HCT 29.2*  --  30.6*  --  29.4*  --   PLT 530*  --  545*  --  484*  --   APTT  --   --   --  103* 111* 73*  HEPARINUNFRC 0.46  --   --  1.24*  --   --   CREATININE 5.42* 4.09* 4.84*  --  4.33*  --     Medical History: Past Medical History:  Diagnosis Date  . Atrial flutter with rapid ventricular response ([place]) 08/06/2015   a. s/p DCCV in 07/2015 with recurrent atrial fibrillation and DCCV in 12/2016. Initially successful but noted to be back in atrial fibrillation within 2 weeks of DCCV.   [name] Chronic diastolic (congestive) heart failure ([facility])    a. EF 50-55% by echo in 06/2016.  . Diabetes ([place])   . Hypertension     Assessment: 47 year old male with afib on xarelto pta. Patient is now ESRD to start HD CLIP process. Patient was transitioned to apixaban and received two doses on 2/20. Needs permanent HD access so will hold apixaban and start IV heparin.   Due to recent apixaban dosing, heparin levels will be inaccurate. Will follow aptt's for monitoring.   APTT delayed from this evening - now therapeutic.  Goal of Therapy:  Aptt goal 66-102 Heparin level 0.3-0.7 units/ml Monitor platelets by anticoagulation protocol: Yes    Plan:  -Continue heparin at 1900 units/hr -Daily HL, CBC, aPTT   Harvel Quale 05/08/2017 12:14 AM

## 2017-05-08 NOTE — Progress Notes (Signed)
PROGRESS NOTE    John Bailey  URK:[id number] DOB: [birth year] DOA: 04/22/2017 PCP: [name], MD   Chief Complaint  Patient presents with  . Generalized body swelling    Brief Narrative:  HPI on 04/22/2017 by Dr. Quintella Baton This is a 47 year old gentleman who presents with complaints of weight gain, increasing edema and difficulty walking as a result.  This is a patient with rheumatoid arthritis, diabetes mellitus, chronic fluid overload.  He was started on prednisone approximately 9 weeks ago to treat his rheumatoid arthritis.  At that time his Lasix was continued.  He was maintained on prednisone for 6 weeks.  His Lasix was restarted approximately 3 weeks ago.  With his Lasix being held he has gained weight, he states approximately 15 pounds.  Despite being restarted on his Lasix he is not lost any weight to the point now it has become difficult for him to walk.  He saw his PCP today and was sent him to the ER.  The patient denies any cough, wheeze or chest pains.  He does report increased shortness of breath with activity.  History provided by the patient.  The ER the patient is also hypoxic with ambulation, his oxygen sats going down to 80% with ambulation.  Interim history  Admitted for Acute CHF. Was placed on IV Lasix however this was discontinued as patient developed worsening renal failure.  Nephrology consulted and appreciated, patient started dialysis. Pending CLIP process and permanent access placement.  Assessment & Plan   Acute on chronic diastolic heart failure -Echocardiogram showed an EF of 55-60% with severe LVH, dilated left atrium and trivial pericardial effusion.  Unable to assess diastolic dysfunction. -Patient was started on IV Lasix however this was discontinued due to poor urine output and worsening creatinine  -Nephrology was consulted and appreciated, patient started dialysis and continues to have HD almost daily- had dialyzed today and next HD will be on  2/25 -Vascular surgery consulted and appreciated, for permanent HD access placement- possibly on 05/10/2017 -stockings in place of UNNA boots  Anasarca -Secondary to volume overload  -continue management with dialysis  Rheumatoid arthritis with flare -Patient receives Abatacept every Tuesday -knee x-rays negative for knee effusion -Continue methocarbamol, gabapentin -Dr. Alfredia Ferguson, previous hospitalist discussed with Dr. 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  -Discussed with Dr. Donnetta Hutching, vascular surgery, possible placement of permanent access next week -discussed with Dr. Marylou Flesher, nephrology, feels this is now ESRD, CLIP in process   Hypertension -Blood pressure currently stable -Cardiology, Dr. Caryl Comes checking to rule out amyloid given the degree of LVH -Immunoelectrophoresis to check for amyloid however unable to collect 24-hour urine due to worsening renal failure  Hyperkalemia/hypokalemia -K 2.4 today, ordered replacement -Continue to monitor BMP  Leukocytosis -Possibly secondary to RA flare versus steroids -Previous hospitalist discussed with infectious disease, Dr. Drucilla Schmidt who believes  WBC to be related to RA instead of infection -will order smear  Diabetes mellitus, type II -Continue Lantus and insulin sliding scale with CBG monitoring  Hyperphosphatemia  -continue dialysis management and monitor  Diarrhea -C. difficile checked and was unremarkable, GI pathogen panel negative -Continue to monitor  Physical deconditioning -PT/OT consulted and PT recommended SNF, OT recommended CIR -CIR consulted- potential candidate -Social work consulted  DVT Prophylaxis heparin  Code Status: Full  Family Communication: none at bedside  Disposition Plan: Admitted. Pending CLIP process and possible access placement next week  Consultants Cardiology/heart failure team Nephrology  Interventional radiology Infectious disease via phone Rheumatology via phone Vascular surgery Inpatient rehab  Procedures  Echocardiogram Right upper extremity vein mapping RIJ TDC, RIJ vein cannulation under [place], removal of RIJ vein temp dialysis catheter   Antibiotics   Anti-infectives (From admission, onward)   Start     Dose/Rate Route Frequency Ordered Stop   05/04/17 1215  ceFAZolin (ANCEF) 3 g in dextrose 5 % 50 mL IVPB    Comments:  Send with pt to OR   3 g 130 mL/hr over 30 Minutes Intravenous To Surgery 05/04/17 1202 05/04/17 1450   05/04/17 0600  ceFAZolin (ANCEF) IVPB 1 g/50 mL premix  Status:  Discontinued    Comments:  Send with pt to OR   1 g 100 mL/hr over 30 Minutes Intravenous To Surgery 05/03/17 1606 05/04/17 1202      Subjective:   John Bailey seen and examined today. Patient feeling better today. No complaints of chest pain, shortness of breath, abdominal pain, nausea, vomiting, dizziness, headache. Feels swelling has improved.  Objective:   Vitals:   05/08/17 1100 05/08/17 1130 05/08/17 1200 05/08/17 1230  BP: 133/74 131/82  129/70  Pulse: 76 76  76  Resp: (!) 24 (!) 22 (!) 27 (!) 24  Temp:    98.3 F (36.8 C)  TempSrc:    Oral  SpO2:    99%    Weight:    133.3 kg (293 lb 14 oz)  Height:        Intake/Output Summary (Last 24 hours) at 05/08/2017 1410 Last data filed at 05/08/2017 1230 Gross per 24 hour  Intake 360 ml  Output 5000 ml  Net -4640 ml   Filed Weights   05/08/17 0416 05/08/17 0814 05/08/17 1230  Weight: (!) 141 kg (310 lb 13.6 oz) (!) 137.6 kg (303 lb 5.7 oz) 133.3 kg (293 lb 14 oz)   Exam  General: Well developed, well nourished, NAD, appears stated age  HEENT: NCAT, mucous membranes moist.   Neck: Supple  Cardiovascular: S1 S2 auscultated, no rubs, murmurs or gallops. Regular rate and rhythm.  Respiratory: Clear to auscultation bilaterally with equal chest rise  Abdomen: Soft, nontender, nondistended, + bowel sounds  Extremities: warm dry without cyanosis clubbing. Trace LE edema B/L   Neuro: AAOx3, nonfocal  Psych: Pleasant mood and affect, pleasant   Data Reviewed: I have personally reviewed following labs and imaging studies  CBC: Recent Labs  Lab 05/02/17 0750 05/03/17 0838 05/04/17 0540 05/05/17 0430 05/06/17 0745 05/07/17 0516 05/08/17 0337  WBC 27.0* 27.6* 33.3* 26.8* 33.1* 27.1* 35.0*  NEUTROABS 22.1* 25.1* 30.6* 23.0*  --   --   --   HGB 9.8* 9.8* 9.7* 9.8* 10.2* 9.8* 9.8*  HCT 29.4* 29.2* 29.1* 29.2* 30.6* 29.4* 29.9*  MCV 83.3 83.4 83.4 83.4 84.1 84.5 83.8  PLT 503* 484* 539* 530* 545* 484* 341*   Basic Metabolic Panel: Recent  Labs  Lab 05/02/17 0750 05/03/17 6720 05/04/17 0540 05/05/17 0430 05/05/17 1701 05/06/17 0745 05/07/17 0516 05/07/17 2215 05/08/17 0337  NA 131* 129* 131* 133* 134* 133* 135  --  132*  K 3.5 3.1* 3.5 3.1* 3.4* 3.1* 2.6* 3.0* 2.4*  CL 95* 94* 94* 97* 98* 97* 99*  --  95*  CO2 22 21* 21* 21* 22 21* 22  --  23  GLUCOSE 139* 207* 217* 138* 154* 183* 127*  --  231*  BUN 56* 60* 58* 50* 31* 37* 25*  --  21*  CREATININE 4.72* 5.38* 5.58* 5.42* 4.09* 4.84* 4.33*  --  4.03*  CALCIUM 8.3* 8.3* 8.5* 8.3* 8.2* 8.4* 8.3*  --  8.3*  MG 2.3 2.3 2.3 2.2   --   --   --   --   --   PHOS 5.8* 6.5* 6.9* 6.1* 3.6  --   --   --   --    GFR: Estimated Creatinine Clearance: 33.1 mL/min (A) (by C-G formula based on SCr of 4.03 mg/dL (H)). Liver Function Tests: Recent Labs  Lab 05/02/17 0750 05/03/17 0838 05/04/17 0540 05/05/17 0430 05/05/17 1701  AST 11* 10* 12* 12*  --   ALT <5* 7* 8* 7*  --   ALKPHOS 83 80 70 74  --   BILITOT 0.6 0.5 0.6 0.6  --   PROT 6.2* 5.7* 6.1* 5.9*  --   ALBUMIN 2.2* 2.3* 2.3* 2.4* 2.5*   No results for input(s): LIPASE, AMYLASE in the last 168 hours. No results for input(s): AMMONIA in the last 168 hours. Coagulation Profile: Recent Labs  Lab 05/04/17 0540  INR 1.34   Cardiac Enzymes: No results for input(s): CKTOTAL, CKMB, CKMBINDEX, TROPONINI in the last 168 hours. BNP (last 3 results) No results for input(s): PROBNP in the last 8760 hours. HbA1C: No results for input(s): HGBA1C in the last 72 hours. CBG: Recent Labs  Lab 05/07/17 1508 05/07/17 1612 05/07/17 2030 05/08/17 0751 05/08/17 1340  GLUCAP 196* 211* 264* 190* 170*   Lipid Profile: No results for input(s): CHOL, HDL, LDLCALC, TRIG, CHOLHDL, LDLDIRECT in the last 72 hours. Thyroid Function Tests: No results for input(s): TSH, T4TOTAL, FREET4, T3FREE, THYROIDAB in the last 72 hours. Anemia Panel: No results for input(s): VITAMINB12, FOLATE, FERRITIN, TIBC, IRON, RETICCTPCT in the last 72 hours. Urine analysis:    Component Value Date/Time   COLORURINE YELLOW 04/25/2017 2057   APPEARANCEUR CLEAR 04/25/2017 2057   LABSPEC 1.010 04/25/2017 2057   PHURINE 5.0 04/25/2017 2057   GLUCOSEU NEGATIVE 04/25/2017 2057   HGBUR NEGATIVE 04/25/2017 2057   BILIRUBINUR NEGATIVE 04/25/2017 2057   KETONESUR NEGATIVE 04/25/2017 2057   PROTEINUR NEGATIVE 04/25/2017 2057   NITRITE NEGATIVE 04/25/2017 2057   LEUKOCYTESUR NEGATIVE 04/25/2017 2057   Sepsis Labs: @LABRCNTIP (procalcitonin:4,lacticidven:4)  ) Recent Results (from the past 240  hour(s))  Gastrointestinal Panel by PCR , Stool     Status: None   Collection Time: 05/02/17  2:42 PM  Result Value Ref Range Status   Campylobacter species NOT DETECTED NOT DETECTED Final   Plesimonas shigelloides NOT DETECTED NOT DETECTED Final   Salmonella species NOT DETECTED NOT DETECTED Final   Yersinia enterocolitica NOT DETECTED NOT DETECTED Final   Vibrio species NOT DETECTED NOT DETECTED Final   Vibrio cholerae NOT DETECTED NOT DETECTED Final   Enteroaggregative E coli (EAEC) NOT DETECTED NOT DETECTED Final   Enteropathogenic E coli (EPEC) NOT DETECTED NOT DETECTED Final   Enterotoxigenic E coli (  ETEC) NOT DETECTED NOT DETECTED Final   Shiga like toxin producing E coli (STEC) NOT DETECTED NOT DETECTED Final   Shigella/Enteroinvasive E coli (EIEC) NOT DETECTED NOT DETECTED Final   Cryptosporidium NOT DETECTED NOT DETECTED Final   Cyclospora cayetanensis NOT DETECTED NOT DETECTED Final   Entamoeba histolytica NOT DETECTED NOT DETECTED Final   Giardia lamblia NOT DETECTED NOT DETECTED Final   Adenovirus F40/41 NOT DETECTED NOT DETECTED Final   Astrovirus NOT DETECTED NOT DETECTED Final   Norovirus GI/GII NOT DETECTED NOT DETECTED Final   Rotavirus A NOT DETECTED NOT DETECTED Final   Sapovirus (I, II, IV, and V) NOT DETECTED NOT DETECTED Final    Comment: Performed at [facility] [place], [address] [place], [place] [address]  C difficile quick scan w PCR reflex     Status: None   Collection Time: 05/02/17  2:42 PM  Result Value Ref Range Status   C Diff antigen NEGATIVE NEGATIVE Final   C Diff toxin NEGATIVE NEGATIVE Final   C Diff interpretation No C. difficile detected.  Final    Comment: Performed at [facility], [place] [address], [place], [place] [address]  Surgical pcr screen     Status: None   Collection Time: 05/04/17  6:53 AM  Result Value Ref Range Status   MRSA, PCR NEGATIVE NEGATIVE Final   Staphylococcus aureus NEGATIVE NEGATIVE Final    Comment:  (NOTE) The Xpert SA Assay (FDA approved for NASAL specimens in patients 47 years of age and older), is one component of a comprehensive surveillance program. It is not intended to diagnose infection nor to guide or monitor treatment. Performed at [facility], [place] [address], [place], [place] [address]       Radiology Studies: Dg Chest Port 1 View  Result Date: 05/04/2017 CLINICAL DATA:  Dialysis catheter placement. EXAM: PORTABLE CHEST 1 VIEW COMPARISON:  04/29/2017. FINDINGS: Right IJ dual-lumen catheter noted with tip at the cavoatrial junction. Cardiomegaly with mild pulmonary vascular prominence. No focal infiltrate. No pleural effusion or pneumothorax. IMPRESSION: 1. Right IJ dual-lumen catheter noted with tip at the cavoatrial junction. 2.  Cardiomegaly with mild pulmonary venous congestion. Electronically Signed   By: Marcello Moores  Register   On: 05/04/2017 16:23   Dg Fluoro Guide Cv Line-no Report  Result Date: 05/04/2017 Fluoroscopy was utilized by the requesting physician.  No radiographic interpretation.   Vas [place] Upper Extremity Arterial Duplex  Result Date: 05/03/2017 UPPER EXTREMITY DUPLEX STUDY Indications: Pre-op for AVF. Examination Guidelines: A complete evaluation includes B-mode imaging, spectral doppler, color doppler, and power doppler as needed of all accessible portions of each vessel. Bilateral testing is considered an integral part of a complete examination. Limited examinations for reoccurring indications may be performed as noted.  Right Pre-Dialysis Findings: +-----------------+----------+------------------+---------+--------------------+ Location         PSV (cm/s)Intralum. Diam.   Waveform Comments                                        (mm)                                            +-----------------+----------+------------------+---------+--------------------+ Brachial Antecub.Peck  triphasicproximal upper arm   fossa                                                                       +-----------------+----------+------------------+---------+--------------------+ Radial Art at    51        2.60              biphasic high bifurcation in  Wrist                                                 the upper arm        +-----------------+----------+------------------+---------+--------------------+ Ulnar Art at     44        3.00              biphasic high bifurcation in  Wrist                                                 the upper arm        +-----------------+----------+------------------+---------+--------------------+ Left Pre-Dialysis Findings: +-----------------------+----------+--------------------+---------+--------+ Location               PSV (cm/s)Intralum. Diam. (mm)Waveform Comments +-----------------------+----------+--------------------+---------+--------+ Brachial Antecub. fossa47        6.20                triphasic         +-----------------------+----------+--------------------+---------+--------+ Radial Art at Wrist    67        3.20                                  +-----------------------+----------+--------------------+---------+--------+ Ulnar Art at Wrist     73        4.70                triphasic         +-----------------------+----------+--------------------+---------+--------+ Final Interpretation:  Right: No obstruction visualized in the right upper extremity. Left: No obstruction visualized in the left upper extremity. *See table(s) above for measurements and observations. Electronically signed by Curt Jews on 05/03/2017 at 7:11:24 PM.   Vas [place] Upper Ext Vein Mapping (pre-op Avf)  Result Date: 05/03/2017 UPPER EXTREMITY VEIN MAPPING History: Pre-access. Examination Guidelines: A complete evaluation includes B-mode imaging, spectral doppler, color doppler, and power doppler as needed of all accessible portions of each vessel. Bilateral testing is  considered an integral part of a complete examination. Limited examinations for reoccurring indications may be performed as noted. +-----------------+-------------+----------+---------+ Right Cephalic   Diameter (mm)Depth (mm)Findings  +-----------------+-------------+----------+---------+ Shoulder             3.80        6.30             +-----------------+-------------+----------+---------+ Prox upper arm       3.10       11.80             +-----------------+-------------+----------+---------+ Mid upper arm  bandages  +-----------------+-------------+----------+---------+ Dist upper arm       2.40        4.60             +-----------------+-------------+----------+---------+ Antecubital fossa    1.70        9.40             +-----------------+-------------+----------+---------+ Prox forearm         3.80        9.60             +-----------------+-------------+----------+---------+ Mid forearm          2.90        9.40   branching +-----------------+-------------+----------+---------+ Dist forearm         3.20        2.80             +-----------------+-------------+----------+---------+ Wrist                1.90        2.70             +-----------------+-------------+----------+---------+ +-----------------+-------------+----------+--------------+ Right Basilic    Diameter (mm)Depth (mm)   Findings    +-----------------+-------------+----------+--------------+ Shoulder                                not visualized +-----------------+-------------+----------+--------------+ Prox upper arm                          not visualized +-----------------+-------------+----------+--------------+ Mid upper arm                           not visualized +-----------------+-------------+----------+--------------+ Dist upper arm                          not visualized +-----------------+-------------+----------+--------------+  Antecubital fossa                       not visualized +-----------------+-------------+----------+--------------+ Prox forearm         2.10                              +-----------------+-------------+----------+--------------+ Mid forearm          1.50                              +-----------------+-------------+----------+--------------+ Distal forearm       1.60                              +-----------------+-------------+----------+--------------+ Wrist                1.40                              +-----------------+-------------+----------+--------------+ +-----------------+-------------+----------+---------+ Left Cephalic    Diameter (mm)Depth (mm)Findings  +-----------------+-------------+----------+---------+ Shoulder             3.10       20.00             +-----------------+-------------+----------+---------+ Prox upper arm       3.60       13.50             +-----------------+-------------+----------+---------+  Mid upper arm        2.90       10.00             +-----------------+-------------+----------+---------+ Dist upper arm       2.70       10.00             +-----------------+-------------+----------+---------+ Antecubital fossa    3.70        3.00             +-----------------+-------------+----------+---------+ Prox forearm         2.80        8.50   branching +-----------------+-------------+----------+---------+ Mid forearm          2.90        4.90             +-----------------+-------------+----------+---------+ Dist forearm         2.80        4.20             +-----------------+-------------+----------+---------+ Wrist                3.50        4.10             +-----------------+-------------+----------+---------+ +-----------------+-------------+----------+--------------+ Left Basilic     Diameter (mm)Depth (mm)   Findings    +-----------------+-------------+----------+--------------+ Shoulder                                 not visualized +-----------------+-------------+----------+--------------+ Prox upper arm                          not visualized +-----------------+-------------+----------+--------------+ Mid upper arm                           not visualized +-----------------+-------------+----------+--------------+ Dist upper arm                          not visualized +-----------------+-------------+----------+--------------+ Antecubital fossa    3.40                              +-----------------+-------------+----------+--------------+ Prox forearm         4.00                              +-----------------+-------------+----------+--------------+ Mid forearm          1.40                              +-----------------+-------------+----------+--------------+ Distal forearm                          not visualized +-----------------+-------------+----------+--------------+ Elbow                                   not visualized +-----------------+-------------+----------+--------------+ Wrist                                   not visualized +-----------------+-------------+----------+--------------+ Final Interpretation: *See table(s) above for measurements and observations.  Curt Jews Electronically signed  by Curt Jews on 05/03/2017 at 7:10:36 PM.      Scheduled Meds: . amiodarone  200 mg Oral BID  . carvedilol  18.75 mg Oral BID WC  . fluticasone  2 spray Each Nare Daily  . gabapentin  300 mg Oral Daily  . insulin aspart  0-15 Units Subcutaneous TID WC  . insulin aspart  0-5 Units Subcutaneous QHS  . insulin glargine  25 Units Subcutaneous QHS  . predniSONE  30 mg Oral Q breakfast  . sodium chloride flush  3 mL Intravenous Q12H  . sodium chloride flush  3 mL Intravenous Q12H   Continuous Infusions: . sodium chloride    . sodium chloride    . sodium chloride 10 mL/hr at 05/04/17 1220  . heparin 1,900 Units/hr (05/08/17 0600)      LOS: 16 days   Time Spent in minutes   30 minutes  Jawann Urbani D.O. on 05/08/2017 at 2:10 PM  Between 7am to 7pm - Pager - [phone]  After 7pm go to [email or web address] - password [email or web address]  And look for the night coverage person covering for me after hours  [facility] Office  [phone]

## 2017-05-09 LAB — DIFFERENTIAL
BASOS ABS: 0 10*3/uL (ref 0.0–0.1)
BASOS PCT: 0 %
EOS PCT: 1 %
Eosinophils Absolute: 0.4 10*3/uL (ref 0.0–0.7)
LYMPHS PCT: 7 %
Lymphs Abs: 2.4 10*3/uL (ref 0.7–4.0)
MONO ABS: 2.4 10*3/uL — AB (ref 0.1–1.0)
Monocytes Relative: 7 %
Neutro Abs: 29.7 10*3/uL — ABNORMAL HIGH (ref 1.7–7.7)
Neutrophils Relative %: 85 %

## 2017-05-09 LAB — BASIC METABOLIC PANEL
ANION GAP: 13 (ref 5–15)
BUN: 22 mg/dL — AB (ref 6–20)
CALCIUM: 8.6 mg/dL — AB (ref 8.9–10.3)
CO2: 23 mmol/L (ref 22–32)
CREATININE: 4 mg/dL — AB (ref 0.61–1.24)
Chloride: 99 mmol/L — ABNORMAL LOW (ref 101–111)
GFR calc Af Amer: 19 mL/min — ABNORMAL LOW (ref >60)
GFR, EST NON AFRICAN AMERICAN: 17 mL/min — AB (ref >60)
GLUCOSE: 143 mg/dL — AB (ref 65–99)
Potassium: 2.8 mmol/L — ABNORMAL LOW (ref 3.5–5.1)
Sodium: 135 mmol/L (ref 135–145)

## 2017-05-09 LAB — MAGNESIUM: Magnesium: 2 mg/dL (ref 1.7–2.4)

## 2017-05-09 LAB — CBC
HCT: 30.5 % — ABNORMAL LOW (ref 39.0–52.0)
Hemoglobin: 10.2 g/dL — ABNORMAL LOW (ref 13.0–17.0)
MCH: 28.2 pg (ref 26.0–34.0)
MCHC: 33.4 g/dL (ref 30.0–36.0)
MCV: 84.3 fL (ref 78.0–100.0)
PLATELETS: 540 10*3/uL — AB (ref 150–400)
RBC: 3.62 MIL/uL — ABNORMAL LOW (ref 4.22–5.81)
RDW: 19.1 % — AB (ref 11.5–15.5)
WBC: 34.9 10*3/uL — ABNORMAL HIGH (ref 4.0–10.5)

## 2017-05-09 LAB — GLUCOSE, CAPILLARY
GLUCOSE-CAPILLARY: 220 mg/dL — AB (ref 65–99)
GLUCOSE-CAPILLARY: 231 mg/dL — AB (ref 65–99)
Glucose-Capillary: 144 mg/dL — ABNORMAL HIGH (ref 65–99)
Glucose-Capillary: 182 mg/dL — ABNORMAL HIGH (ref 65–99)

## 2017-05-09 LAB — APTT: APTT: 86 s — AB (ref 24–36)

## 2017-05-09 LAB — HEPARIN LEVEL (UNFRACTIONATED): HEPARIN UNFRACTIONATED: 0.35 [IU]/mL (ref 0.30–0.70)

## 2017-05-09 MED ORDER — PREDNISONE 20 MG PO TABS
20.0000 mg | ORAL_TABLET | Freq: Every day | ORAL | Status: DC
  Administered 2017-05-10 – 2017-05-12 (×3): 20 mg via ORAL
  Filled 2017-05-09 (×3): qty 1

## 2017-05-09 MED ORDER — TRAMADOL HCL 50 MG PO TABS
50.0000 mg | ORAL_TABLET | Freq: Once | ORAL | Status: AC
  Administered 2017-05-09: 50 mg via ORAL
  Filled 2017-05-09: qty 1

## 2017-05-09 MED ORDER — POTASSIUM CHLORIDE CRYS ER 20 MEQ PO TBCR
40.0000 meq | EXTENDED_RELEASE_TABLET | Freq: Once | ORAL | Status: AC
  Administered 2017-05-09: 40 meq via ORAL
  Filled 2017-05-09: qty 2

## 2017-05-09 NOTE — H&P (View-Only) (Signed)
   Plan for left arm avf vs avg tomorrow in OR. IV site to be changed to right arm today. Npo past midnight.   Sashay Felling C. Donzetta Matters, MD [facility] Office: [phone] Pager: [phone]

## 2017-05-09 NOTE — Progress Notes (Signed)
ANTICOAGULATION CONSULT NOTE  Pharmacy Consult for Heparin Indication: atrial fibrillation  No Known Allergies  Patient Measurements: Height: 6' (182.9 cm) Weight: 293 lb 3.4 oz (133 kg) IBW/kg (Calculated) : 77.6  Heparin dosing weight: 111 kg  Vital Signs: Temp: 98.1 F (36.7 C) (02/24 0736) Temp Source: Oral (02/24 0736) BP: 121/76 (02/24 0736) Pulse Rate: 73 (02/24 0736)  Labs: Recent Labs    05/06/17 2239  05/07/17 0516 05/07/17 2215 05/08/17 0337 05/09/17 0531  HGB  --    < > 9.8*  --  9.8* 10.2*  HCT  --   --  29.4*  --  29.9* 30.5*  PLT  --   --  484*  --  463* 540*  APTT 103*  --  111* 73* 99* 86*  HEPARINUNFRC 1.24*  --   --   --  0.65 0.35  CREATININE  --   --  4.33*  --  4.03* 4.00*   < > = values in this interval not displayed.    Medical History: Past Medical History:  Diagnosis Date  . Atrial flutter with rapid ventricular response ([place]) 08/06/2015   a. s/p DCCV in 07/2015 with recurrent atrial fibrillation and DCCV in 12/2016. Initially successful but noted to be back in atrial fibrillation within 2 weeks of DCCV.   [name] Chronic diastolic (congestive) heart failure ([facility])    a. EF 50-55% by echo in 06/2016.  . Diabetes ([place])   . Hypertension     Assessment: 47 year old male with Afib on Xarelto PTA. Patient is now ESRD to start HD CLIP process. Patient was transitioned to Eliquis and received two doses on 2/20. Needs permanent HD access so will hold Eliquis and continue IV Heparin.   Heparin level and aPTTs now correlating - will use heparin levels to continue dosing  Heparin level therapeutic this AM: 0.35  Goal of Therapy:  Heparin level 0.3-0.7 units/ml Monitor platelets by anticoagulation protocol: Yes   Plan:  Increase Heparin slightly to remain therapeutic to 1950 units/hr Daily HL, CBC Monitor for s/sx bleeding For AVF vs AVG Monday - f/u plans   Ladavion Savitz L. Kyung Rudd, PharmD, [place] PGY1 Pharmacy Resident Pager: [phone]

## 2017-05-09 NOTE — Anesthesia Preprocedure Evaluation (Addendum)
  Anesthesia Plan  ASA: IV  Anesthesia Plan: MAC   Post-op Pain Management:    Induction: Intravenous  PONV Risk Score and Plan: 1 and Ondansetron  Airway Management Planned: Simple Face Mask and Nasal Cannula  Additional Equipment:   Intra-op Plan:   Post-operative Plan:   Informed Consent: I have reviewed the patients History and Physical, chart, labs and discussed the procedure including the risks, benefits and alternatives for the proposed anesthesia with the patient or authorized representative who has indicated his/her understanding and acceptance.   Dental advisory given  Plan Discussed with: CRNA and Surgeon  Anesthesia Plan Comments: (Very tight IVF control Supplement with nitrous as needed A conversion to GETA would probably require post-op ventilation)        Anesthesia Quick Evaluation

## 2017-05-09 NOTE — Progress Notes (Signed)
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 amiodarone  200 mg Oral BID  . carvedilol  18.75 mg Oral BID WC  . fluticasone  2 spray Each Nare Daily  . gabapentin  300 mg Oral Daily  . insulin aspart  0-15 Units Subcutaneous TID WC  . insulin aspart  0-5 Units Subcutaneous QHS  . insulin glargine  25 Units Subcutaneous QHS  . potassium chloride  40 mEq Oral Once  . predniSONE  30 mg Oral Q breakfast  . sodium chloride flush  3 mL Intravenous Q12H  . sodium chloride flush  3 mL Intravenous Q12H   Continuous Infusions: . sodium chloride    . sodium chloride    . sodium chloride 10 mL/hr at 05/04/17 1220  . heparin 1,900 Units/hr (05/08/17 2112)   PRN Meds:.sodium chloride, acetaminophen, hyoscyamine, methocarbamol, ondansetron (ZOFRAN) IV, sodium chloride flush, sodium chloride flush, sodium chloride flush  Current Labs: reviewed    Physical Exam:  Blood pressure 121/76, pulse 73, temperature 98.1 F (36.7 C), temperature source Oral, resp. rate 15, height 6' (1.829 m), weight 133 kg (293 lb 3.4 oz), SpO2 97 %. RRR NAD on HD 1+ edema in legs now R IJ TDC in place S/nt/nd Diminshed BS in bases  A 1. Progressive CKD4, now ESRD; CLIP in process, has TDC; VVS to place AVG or AVF tomorrow 2. Anemia:  3. HTN: stable BPs 4. Afib s/p DCCV in NSR on heparin gtt 5. Leukocytosis 6. Deconditioning perhaps for CIR  P 1. Surgery tomorrow, No HD unless stronger indication develops and would do next 2/26 2. F/u CLIP 3. Needs PTH with next  labs   Pearson Grippe MD 05/09/2017, 10:19 AM  Recent Labs  Lab 05/04/17 0540 05/05/17 0430 05/05/17 1701  05/07/17 0516  05/08/17 0337 05/08/17 1553 05/09/17 0531  NA 131* 133* 134*   < > 135  --  132*  --  135  K 3.5 3.1* 3.4*   < > 2.6*   < > 2.4* 2.8* 2.8*  CL 94* 97* 98*   < > 99*  --  95*  --  99*  CO2 21* 21* 22   < > 22  --  23  --  23  GLUCOSE 217* 138* 154*   < > 127*  --  231*  --  143*  BUN 58* 50* 31*   < > 25*  --  21*  --  22*  CREATININE 5.58* 5.42* 4.09*   < > 4.33*  --  4.03*  --  4.00*  CALCIUM 8.5* 8.3* 8.2*   < > 8.3*  --  8.3*  --  8.6*  PHOS 6.9* 6.1* 3.6  --   --   --   --   --   --    < > = values in this interval not displayed.   Recent Labs  Lab 05/04/17 0540 05/05/17 0430  05/07/17 0516 05/08/17 0337 05/09/17 0531  WBC 33.3* 26.8*   < >  27.1* 35.0* 34.9*  NEUTROABS 30.6* 23.0*  --   --   --  29.7*  HGB 9.7* 9.8*   < > 9.8* 9.8* 10.2*  HCT 29.1* 29.2*   < > 29.4* 29.9* 30.5*  MCV 83.4 83.4   < > 84.5 83.8 84.3  PLT 539* 530*   < > 484* 463* 540*   < > = values in this interval not displayed.

## 2017-05-09 NOTE — Progress Notes (Signed)
   Plan for left arm avf vs avg tomorrow in OR. IV site to be changed to right arm today. Npo past midnight.   Kohen Reither C. Donzetta Matters, MD [facility] Office: [phone] Pager: [phone]

## 2017-05-09 NOTE — Progress Notes (Signed)
PROGRESS NOTE    John Bailey  ION:[id number] DOB: [birth year] DOA: 04/22/2017 PCP: [name], MD   Chief Complaint  Patient presents with  . Generalized body swelling    Brief Narrative:  HPI on 04/22/2017 by Dr. Quintella Baton This is a 47 year old gentleman who presents with complaints of weight gain, increasing edema and difficulty walking as a result.  This is a patient with rheumatoid arthritis, diabetes mellitus, chronic fluid overload.  He was started on prednisone approximately 9 weeks ago to treat his rheumatoid arthritis.  At that time his Lasix was continued.  He was maintained on prednisone for 6 weeks.  His Lasix was restarted approximately 3 weeks ago.  With his Lasix being held he has gained weight, he states approximately 15 pounds.  Despite being restarted on his Lasix he is not lost any weight to the point now it has become difficult for him to walk.  He saw his PCP today and was sent him to the ER.  The patient denies any cough, wheeze or chest pains.  He does report increased shortness of breath with activity.  History provided by the patient.  The ER the patient is also hypoxic with ambulation, his oxygen sats going down to 80% with ambulation.  Interim history  Admitted for Acute CHF. Was placed on IV Lasix however this was discontinued as patient developed worsening renal failure.  Nephrology consulted and appreciated, patient started dialysis. Pending CLIP process and permanent access placement.  Assessment & Plan   Acute on chronic diastolic heart failure -Echocardiogram showed an EF of 55-60% with severe LVH, dilated left atrium and trivial pericardial effusion.  Unable to assess diastolic dysfunction. -Patient was started on IV Lasix however this was discontinued due to poor urine output and worsening creatinine  -Nephrology was consulted and appreciated, patient started dialysis and continues to have HD almost daily- had dialyzed today and next HD will be on  2/25 -Vascular surgery consulted and appreciated, for permanent HD access placement- possibly on 05/10/2017 -UNNA boots were removed  -patient has lost about 95lbs thus far  Anasarca -Secondary to volume overload  -continue management with dialysis  Rheumatoid arthritis with flare -Patient receives Abatacept every Tuesday -knee x-rays negative for knee effusion -Continue methocarbamol, gabapentin -Dr. Alfredia Ferguson, previous hospitalist discussed with Dr. 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  -Discussed with Dr. Donnetta Hutching, vascular surgery, possible placement of permanent access next week -discussed with Dr. Marylou Flesher, nephrology, feels this is now ESRD, CLIP in process  -patient inquires about home dialysis  Hypertension -Blood pressure currently stable -Cardiology, Dr. Caryl Comes checking to rule out amyloid given the degree of LVH -Immunoelectrophoresis to check for amyloid however unable to collect 24-hour urine due to worsening renal failure  Hyperkalemia/hypokalemia -K 2.8 today, continue replacing -magnesium ordered, 2  -Continue to monitor BMP  Leukocytosis -Possibly secondary to RA flare versus  steroids -Previous hospitalist discussed with infectious disease, Dr. Drucilla Schmidt who believes WBC to be related to RA instead of infection -will order smear  Diabetes mellitus, type II -Continue Lantus and insulin sliding scale with CBG monitoring  Hyperphosphatemia  -continue dialysis management and monitor  Diarrhea -C. difficile checked and was unremarkable, GI pathogen panel negative -Continue to monitor  Physical deconditioning -PT/OT consulted and PT recommended SNF, OT recommended CIR -CIR consulted- potential candidate -Social work consulted  DVT Prophylaxis heparin  Code Status: Full  Family Communication: none at bedside  Disposition Plan: Admitted. Pending CLIP process and possible access placement next week  Consultants Cardiology/heart failure team Nephrology  Interventional radiology Infectious disease via phone Rheumatology via phone Vascular surgery Inpatient rehab  Procedures  Echocardiogram Right upper extremity vein mapping RIJ TDC, RIJ vein cannulation under [place], removal of RIJ vein temp dialysis catheter   Antibiotics   Anti-infectives (From admission, onward)   Start     Dose/Rate Route Frequency Ordered Stop   05/04/17 1215  ceFAZolin (ANCEF) 3 g in dextrose 5 % 50 mL IVPB    Comments:  Send with pt to OR   3 g 130 mL/hr over 30 Minutes Intravenous To Surgery 05/04/17 1202 05/04/17 1450   05/04/17 0600  ceFAZolin (ANCEF) IVPB 1 g/50 mL premix  Status:  Discontinued    Comments:  Send with pt to OR   1 g 100 mL/hr over 30 Minutes Intravenous To Surgery 05/03/17 1606 05/04/17 1202      Subjective:   John Bailey seen and examined today.  Patient is feeling better today.  Denies chest pain, shortness breath, abdominal pain, nausea vomiting, dizziness or headache.  Patient inquires about doing dialysis at home as he is administering travels frequently. Objective:   Vitals:   05/09/17 0339 05/09/17 0400 05/09/17 0736 05/09/17 1119  BP:  132/77  121/76 117/73  Pulse: 74  73 75  Resp: (!) 22 20 15 18   Temp: (!) 97.5 F (36.4 C)  98.1 F (36.7 C) 97.9 F (36.6 C)  TempSrc: Oral  Oral Oral  SpO2: 96%  97%   Weight: 133 kg (293 lb 3.4 oz)     Height:        Intake/Output Summary (Last 24 hours) at 05/09/2017 1220 Last data filed at 05/09/2017 0700 Gross per 24 hour  Intake 325.2 ml  Output 5000 ml  Net -4674.8 ml   Filed Weights   05/08/17 0814 05/08/17 1230 05/09/17 0339  Weight: (!) 137.6 kg (303 lb 5.7 oz) 133.3 kg (293 lb 14 oz) 133 kg (293 lb 3.4 oz)   Exam  General: Well developed, well nourished, NAD, appears stated age  HEENT: NCAT, mucous membranes moist.   Neck: Supple  Cardiovascular: S1 S2 auscultated, no rubs, murmurs or gallops. Regular rate and rhythm.  Respiratory: Clear to auscultation bilaterally with equal chest rise  Abdomen: Soft, nontender, nondistended, + bowel sounds  Extremities: warm dry without cyanosis clubbing. +LE edema B/L   Neuro: AAOx3, nonfocal  Psych: Appropriate mood and affect  Data Reviewed: I have personally reviewed following labs and imaging studies  CBC: Recent Labs  Lab 05/03/17 0838 05/04/17 0540 05/05/17 0430 05/06/17 0745 05/07/17 0516 05/08/17 0337 05/09/17 0531  WBC 27.6* 33.3* 26.8* 33.1* 27.1* 35.0* 34.9*  NEUTROABS 25.1* 30.6* 23.0*  --   --   --  29.7*  HGB 9.8* 9.7* 9.8* 10.2* 9.8* 9.8* 10.2*  HCT 29.2* 29.1* 29.2* 30.6* 29.4* 29.9* 30.5*  MCV 83.4 83.4  83.4 84.1 84.5 83.8 84.3  PLT 484* 539* 530* 545* 484* 463* 790*   Basic Metabolic Panel: Recent Labs  Lab 05/03/17 0838 05/04/17 0540 05/05/17 0430 05/05/17 1701 05/06/17 0745 05/07/17 0516 05/07/17 2215 05/08/17 0337 05/08/17 1553 05/09/17 0531  NA 129* 131* 133* 134* 133* 135  --  132*  --  135  K 3.1* 3.5 3.1* 3.4* 3.1* 2.6* 3.0* 2.4* 2.8* 2.8*  CL 94* 94* 97* 98* 97* 99*  --  95*  --  99*  CO2 21* 21* 21* 22 21* 22  --  23  --  23  GLUCOSE 207* 217* 138* 154* 183* 127*   --  231*  --  143*  BUN 60* 58* 50* 31* 37* 25*  --  21*  --  22*  CREATININE 5.38* 5.58* 5.42* 4.09* 4.84* 4.33*  --  4.03*  --  4.00*  CALCIUM 8.3* 8.5* 8.3* 8.2* 8.4* 8.3*  --  8.3*  --  8.6*  MG 2.3 2.3 2.2  --   --   --   --   --   --  2.0  PHOS 6.5* 6.9* 6.1* 3.6  --   --   --   --   --   --    GFR: Estimated Creatinine Clearance: 33.3 mL/min (A) (by C-G formula based on SCr of 4 mg/dL (H)). Liver Function Tests: Recent Labs  Lab 05/03/17 0838 05/04/17 0540 05/05/17 0430 05/05/17 1701  AST 10* 12* 12*  --   ALT 7* 8* 7*  --   ALKPHOS 80 70 74  --   BILITOT 0.5 0.6 0.6  --   PROT 5.7* 6.1* 5.9*  --   ALBUMIN 2.3* 2.3* 2.4* 2.5*   No results for input(s): LIPASE, AMYLASE in the last 168 hours. No results for input(s): AMMONIA in the last 168 hours. Coagulation Profile: Recent Labs  Lab 05/04/17 0540  INR 1.34   Cardiac Enzymes: No results for input(s): CKTOTAL, CKMB, CKMBINDEX, TROPONINI in the last 168 hours. BNP (last 3 results) No results for input(s): PROBNP in the last 8760 hours. HbA1C: No results for input(s): HGBA1C in the last 72 hours. CBG: Recent Labs  Lab 05/08/17 1340 05/08/17 1631 05/08/17 2103 05/09/17 0734 05/09/17 1115  GLUCAP 170* 188* 196* 144* 182*   Lipid Profile: No results for input(s): CHOL, HDL, LDLCALC, TRIG, CHOLHDL, LDLDIRECT in the last 72 hours. Thyroid Function Tests: No results for input(s): TSH, T4TOTAL, FREET4, T3FREE, THYROIDAB in the last 72 hours. Anemia Panel: No results for input(s): VITAMINB12, FOLATE, FERRITIN, TIBC, IRON, RETICCTPCT in the last 72 hours. Urine analysis:    Component Value Date/Time   COLORURINE YELLOW 04/25/2017 2057   APPEARANCEUR CLEAR 04/25/2017 2057   LABSPEC 1.010 04/25/2017 2057   PHURINE 5.0 04/25/2017 2057   GLUCOSEU NEGATIVE 04/25/2017 2057   HGBUR NEGATIVE 04/25/2017 2057   BILIRUBINUR NEGATIVE 04/25/2017 2057   KETONESUR NEGATIVE 04/25/2017 2057   PROTEINUR NEGATIVE 04/25/2017  2057   NITRITE NEGATIVE 04/25/2017 2057   LEUKOCYTESUR NEGATIVE 04/25/2017 2057   Sepsis Labs: @LABRCNTIP (procalcitonin:4,lacticidven:4)  ) Recent Results (from the past 240 hour(s))  Gastrointestinal Panel by PCR , Stool     Status: None   Collection Time: 05/02/17  2:42 PM  Result Value Ref Range Status   Campylobacter species NOT DETECTED NOT DETECTED Final   Plesimonas shigelloides NOT DETECTED NOT DETECTED Final   Salmonella species NOT DETECTED NOT DETECTED Final   Yersinia enterocolitica NOT DETECTED NOT DETECTED Final  Vibrio species NOT DETECTED NOT DETECTED Final   Vibrio cholerae NOT DETECTED NOT DETECTED Final   Enteroaggregative E coli (EAEC) NOT DETECTED NOT DETECTED Final   Enteropathogenic E coli (EPEC) NOT DETECTED NOT DETECTED Final   Enterotoxigenic E coli (ETEC) NOT DETECTED NOT DETECTED Final   Shiga like toxin producing E coli (STEC) NOT DETECTED NOT DETECTED Final   Shigella/Enteroinvasive E coli (EIEC) NOT DETECTED NOT DETECTED Final   Cryptosporidium NOT DETECTED NOT DETECTED Final   Cyclospora cayetanensis NOT DETECTED NOT DETECTED Final   Entamoeba histolytica NOT DETECTED NOT DETECTED Final   Giardia lamblia NOT DETECTED NOT DETECTED Final   Adenovirus F40/41 NOT DETECTED NOT DETECTED Final   Astrovirus NOT DETECTED NOT DETECTED Final   Norovirus GI/GII NOT DETECTED NOT DETECTED Final   Rotavirus A NOT DETECTED NOT DETECTED Final   Sapovirus (I, II, IV, and V) NOT DETECTED NOT DETECTED Final    Comment: Performed at [facility], [address] [place], [place] [address]  C difficile quick scan w PCR reflex     Status: None   Collection Time: 05/02/17  2:42 PM  Result Value Ref Range Status   C Diff antigen NEGATIVE NEGATIVE Final   C Diff toxin NEGATIVE NEGATIVE Final   C Diff interpretation No C. difficile detected.  Final    Comment: Performed at [facility], [place] [address] [place], [place] [address]  Surgical pcr screen      Status: None   Collection Time: 05/04/17  6:53 AM  Result Value Ref Range Status   MRSA, PCR NEGATIVE NEGATIVE Final   Staphylococcus aureus NEGATIVE NEGATIVE Final    Comment: (NOTE) The Xpert SA Assay (FDA approved for NASAL specimens in patients 22 years of age and older), is one component of a comprehensive surveillance program. It is not intended to diagnose infection nor to guide or monitor treatment. Performed at [facility], [place] [address] [place], [place] [address]       Radiology Studies: Dg Chest Port 1 View  Result Date: 05/04/2017 CLINICAL DATA:  Dialysis catheter placement. EXAM: PORTABLE CHEST 1 VIEW COMPARISON:  04/29/2017. FINDINGS: Right IJ dual-lumen catheter noted with tip at the cavoatrial junction. Cardiomegaly with mild pulmonary vascular prominence. No focal infiltrate. No pleural effusion or pneumothorax. IMPRESSION: 1. Right IJ dual-lumen catheter noted with tip at the cavoatrial junction. 2.  Cardiomegaly with mild pulmonary venous congestion. Electronically Signed   By: Marcello Moores  Register   On: 05/04/2017 16:23   Dg Fluoro Guide Cv Line-no Report  Result Date: 05/04/2017 Fluoroscopy was utilized by the requesting physician.  No radiographic interpretation.   Vas [place] Upper Extremity Arterial Duplex  Result Date: 05/03/2017 UPPER EXTREMITY DUPLEX STUDY Indications: Pre-op for AVF. Examination Guidelines: A complete evaluation includes B-mode imaging, spectral doppler, color doppler, and power doppler as needed of all accessible portions of each vessel. Bilateral testing is considered an integral part of a complete examination. Limited examinations for reoccurring indications may be performed as noted.  Right Pre-Dialysis Findings: +-----------------+----------+------------------+---------+--------------------+ Location         PSV (cm/s)Intralum. Diam.   Waveform Comments                                        (mm)                                             +-----------------+----------+------------------+---------+--------------------+  Brachial Antecub.50                          triphasicproximal upper arm   fossa                                                                      +-----------------+----------+------------------+---------+--------------------+ Radial Art at    51        2.60              biphasic high bifurcation in  Wrist                                                 the upper arm        +-----------------+----------+------------------+---------+--------------------+ Ulnar Art at     44        3.00              biphasic high bifurcation in  Wrist                                                 the upper arm        +-----------------+----------+------------------+---------+--------------------+ Left Pre-Dialysis Findings: +-----------------------+----------+--------------------+---------+--------+ Location               PSV (cm/s)Intralum. Diam. (mm)Waveform Comments +-----------------------+----------+--------------------+---------+--------+ Brachial Antecub. fossa47        6.20                triphasic         +-----------------------+----------+--------------------+---------+--------+ Radial Art at Wrist    67        3.20                                  +-----------------------+----------+--------------------+---------+--------+ Ulnar Art at Wrist     73        4.70                triphasic         +-----------------------+----------+--------------------+---------+--------+ Final Interpretation:  Right: No obstruction visualized in the right upper extremity. Left: No obstruction visualized in the left upper extremity. *See table(s) above for measurements and observations. Electronically signed by Curt Jews on 05/03/2017 at 7:11:24 PM.   Vas [place] Upper Ext Vein Mapping (pre-op Avf)  Result Date: 05/03/2017 UPPER EXTREMITY VEIN MAPPING History: Pre-access. Examination  Guidelines: A complete evaluation includes B-mode imaging, spectral doppler, color doppler, and power doppler as needed of all accessible portions of each vessel. Bilateral testing is considered an integral part of a complete examination. Limited examinations for reoccurring indications may be performed as noted. +-----------------+-------------+----------+---------+ Right Cephalic   Diameter (mm)Depth (mm)Findings  +-----------------+-------------+----------+---------+ Shoulder             3.80        6.30             +-----------------+-------------+----------+---------+ Prox upper arm  3.10       11.80             +-----------------+-------------+----------+---------+ Mid upper arm                           bandages  +-----------------+-------------+----------+---------+ Dist upper arm       2.40        4.60             +-----------------+-------------+----------+---------+ Antecubital fossa    1.70        9.40             +-----------------+-------------+----------+---------+ Prox forearm         3.80        9.60             +-----------------+-------------+----------+---------+ Mid forearm          2.90        9.40   branching +-----------------+-------------+----------+---------+ Dist forearm         3.20        2.80             +-----------------+-------------+----------+---------+ Wrist                1.90        2.70             +-----------------+-------------+----------+---------+ +-----------------+-------------+----------+--------------+ Right Basilic    Diameter (mm)Depth (mm)   Findings    +-----------------+-------------+----------+--------------+ Shoulder                                not visualized +-----------------+-------------+----------+--------------+ Prox upper arm                          not visualized +-----------------+-------------+----------+--------------+ Mid upper arm                           not visualized  +-----------------+-------------+----------+--------------+ Dist upper arm                          not visualized +-----------------+-------------+----------+--------------+ Antecubital fossa                       not visualized +-----------------+-------------+----------+--------------+ Prox forearm         2.10                              +-----------------+-------------+----------+--------------+ Mid forearm          1.50                              +-----------------+-------------+----------+--------------+ Distal forearm       1.60                              +-----------------+-------------+----------+--------------+ Wrist                1.40                              +-----------------+-------------+----------+--------------+ +-----------------+-------------+----------+---------+ Left Cephalic    Diameter (mm)Depth (mm)Findings  +-----------------+-------------+----------+---------+ Shoulder  3.10       20.00             +-----------------+-------------+----------+---------+ Prox upper arm       3.60       13.50             +-----------------+-------------+----------+---------+ Mid upper arm        2.90       10.00             +-----------------+-------------+----------+---------+ Dist upper arm       2.70       10.00             +-----------------+-------------+----------+---------+ Antecubital fossa    3.70        3.00             +-----------------+-------------+----------+---------+ Prox forearm         2.80        8.50   branching +-----------------+-------------+----------+---------+ Mid forearm          2.90        4.90             +-----------------+-------------+----------+---------+ Dist forearm         2.80        4.20             +-----------------+-------------+----------+---------+ Wrist                3.50        4.10             +-----------------+-------------+----------+---------+  +-----------------+-------------+----------+--------------+ Left Basilic     Diameter (mm)Depth (mm)   Findings    +-----------------+-------------+----------+--------------+ Shoulder                                not visualized +-----------------+-------------+----------+--------------+ Prox upper arm                          not visualized +-----------------+-------------+----------+--------------+ Mid upper arm                           not visualized +-----------------+-------------+----------+--------------+ Dist upper arm                          not visualized +-----------------+-------------+----------+--------------+ Antecubital fossa    3.40                              +-----------------+-------------+----------+--------------+ Prox forearm         4.00                              +-----------------+-------------+----------+--------------+ Mid forearm          1.40                              +-----------------+-------------+----------+--------------+ Distal forearm                          not visualized +-----------------+-------------+----------+--------------+ Elbow                                   not visualized +-----------------+-------------+----------+--------------+ Wrist  not visualized +-----------------+-------------+----------+--------------+ Final Interpretation: *See table(s) above for measurements and observations.  Curt Jews Electronically signed by Curt Jews on 05/03/2017 at 7:10:36 PM.      Scheduled Meds: . amiodarone  200 mg Oral BID  . carvedilol  18.75 mg Oral BID WC  . fluticasone  2 spray Each Nare Daily  . gabapentin  300 mg Oral Daily  . insulin aspart  0-15 Units Subcutaneous TID WC  . insulin aspart  0-5 Units Subcutaneous QHS  . insulin glargine  25 Units Subcutaneous QHS  . potassium chloride  40 mEq Oral Once  . predniSONE  30 mg Oral Q breakfast  . sodium chloride  flush  3 mL Intravenous Q12H  . sodium chloride flush  3 mL Intravenous Q12H   Continuous Infusions: . sodium chloride    . sodium chloride    . sodium chloride 10 mL/hr at 05/04/17 1220  . heparin 1,900 Units/hr (05/08/17 2112)     LOS: 17 days   Time Spent in minutes   30 minutes  Loriana Samad D.O. on 05/09/2017 at 12:20 PM  Between 7am to 7pm - Pager - [phone]  After 7pm go to [email or web address] - password [email or web address]  And look for the night coverage person covering for me after hours  [facility] Office  [phone]

## 2017-05-10 ENCOUNTER — Inpatient Hospital Stay (HOSPITAL_COMMUNITY): Payer: BLUE CROSS/BLUE SHIELD | Admitting: Anesthesiology

## 2017-05-10 ENCOUNTER — Telehealth: Payer: Self-pay | Admitting: Vascular Surgery

## 2017-05-10 ENCOUNTER — Encounter (HOSPITAL_COMMUNITY)
Admission: EM | Disposition: A | Discharge status: Rehabilitation Facility | Payer: Self-pay | Source: Home / Self Care | Attending: Internal Medicine

## 2017-05-10 HISTORY — PX: AV FISTULA PLACEMENT: SHX1204

## 2017-05-10 LAB — GLUCOSE, CAPILLARY
GLUCOSE-CAPILLARY: 204 mg/dL — AB (ref 65–99)
GLUCOSE-CAPILLARY: 223 mg/dL — AB (ref 65–99)
Glucose-Capillary: 171 mg/dL — ABNORMAL HIGH (ref 65–99)
Glucose-Capillary: 190 mg/dL — ABNORMAL HIGH (ref 65–99)
Glucose-Capillary: 216 mg/dL — ABNORMAL HIGH (ref 65–99)

## 2017-05-10 LAB — BASIC METABOLIC PANEL
ANION GAP: 13 (ref 5–15)
BUN: 41 mg/dL — AB (ref 6–20)
CALCIUM: 8.7 mg/dL — AB (ref 8.9–10.3)
CO2: 21 mmol/L — ABNORMAL LOW (ref 22–32)
Chloride: 100 mmol/L — ABNORMAL LOW (ref 101–111)
Creatinine, Ser: 5.32 mg/dL — ABNORMAL HIGH (ref 0.61–1.24)
GFR calc Af Amer: 14 mL/min — ABNORMAL LOW (ref >60)
GFR, EST NON AFRICAN AMERICAN: 12 mL/min — AB (ref >60)
Glucose, Bld: 208 mg/dL — ABNORMAL HIGH (ref 65–99)
Potassium: 3.4 mmol/L — ABNORMAL LOW (ref 3.5–5.1)
SODIUM: 134 mmol/L — AB (ref 135–145)

## 2017-05-10 LAB — HEPARIN LEVEL (UNFRACTIONATED): HEPARIN UNFRACTIONATED: 0.35 [IU]/mL (ref 0.30–0.70)

## 2017-05-10 LAB — CBC
HEMATOCRIT: 30.2 % — AB (ref 39.0–52.0)
Hemoglobin: 9.7 g/dL — ABNORMAL LOW (ref 13.0–17.0)
MCH: 26.9 pg (ref 26.0–34.0)
MCHC: 32.1 g/dL (ref 30.0–36.0)
MCV: 83.7 fL (ref 78.0–100.0)
Platelets: 540 10*3/uL — ABNORMAL HIGH (ref 150–400)
RBC: 3.61 MIL/uL — ABNORMAL LOW (ref 4.22–5.81)
RDW: 18.8 % — AB (ref 11.5–15.5)
WBC: 33.5 10*3/uL — AB (ref 4.0–10.5)

## 2017-05-10 SURGERY — ARTERIOVENOUS (AV) FISTULA CREATION
Anesthesia: Monitor Anesthesia Care | Site: Arm Lower | Laterality: Left

## 2017-05-10 MED ORDER — ONDANSETRON HCL 4 MG/2ML IJ SOLN
INTRAMUSCULAR | Status: AC
  Filled 2017-05-10: qty 2

## 2017-05-10 MED ORDER — HEPARIN (PORCINE) IN NACL 100-0.45 UNIT/ML-% IJ SOLN
1950.0000 [IU]/h | INTRAMUSCULAR | Status: DC
  Administered 2017-05-10 – 2017-05-11 (×3): 1950 [IU]/h via INTRAVENOUS
  Filled 2017-05-10 (×2): qty 250

## 2017-05-10 MED ORDER — MIDAZOLAM HCL 5 MG/5ML IJ SOLN
INTRAMUSCULAR | Status: DC | PRN
  Administered 2017-05-10 (×2): 1 mg via INTRAVENOUS

## 2017-05-10 MED ORDER — FENTANYL CITRATE (PF) 250 MCG/5ML IJ SOLN
INTRAMUSCULAR | Status: AC
Start: 2017-05-10 — End: ?
  Filled 2017-05-10: qty 5

## 2017-05-10 MED ORDER — SODIUM CHLORIDE 0.9 % IV SOLN
INTRAVENOUS | Status: DC | PRN
  Administered 2017-05-10: 07:00:00 via INTRAVENOUS

## 2017-05-10 MED ORDER — MEPERIDINE HCL 50 MG/ML IJ SOLN: 6.2500 mg | INTRAMUSCULAR | Status: DC | PRN

## 2017-05-10 MED ORDER — SUCCINYLCHOLINE CHLORIDE 200 MG/10ML IV SOSY
PREFILLED_SYRINGE | INTRAVENOUS | Status: AC
  Filled 2017-05-10: qty 10

## 2017-05-10 MED ORDER — FENTANYL CITRATE (PF) 100 MCG/2ML IJ SOLN: 25.0000 ug | INTRAMUSCULAR | Status: DC | PRN

## 2017-05-10 MED ORDER — RENA-VITE PO TABS
1.0000 | ORAL_TABLET | Freq: Every day | ORAL | Status: DC
  Administered 2017-05-10 – 2017-05-12 (×3): 1 via ORAL
  Filled 2017-05-10 (×3): qty 1

## 2017-05-10 MED ORDER — LIDOCAINE 2% (20 MG/ML) 5 ML SYRINGE
INTRAMUSCULAR | Status: AC
  Filled 2017-05-10: qty 5

## 2017-05-10 MED ORDER — LIDOCAINE-EPINEPHRINE 0.5 %-1:200000 IJ SOLN
INTRAMUSCULAR | Status: AC
  Filled 2017-05-10: qty 1

## 2017-05-10 MED ORDER — FENTANYL CITRATE (PF) 100 MCG/2ML IJ SOLN
INTRAMUSCULAR | Status: DC | PRN
  Administered 2017-05-10 (×5): 25 ug via INTRAVENOUS

## 2017-05-10 MED ORDER — LIDOCAINE-EPINEPHRINE 0.5 %-1:200000 IJ SOLN
INTRAMUSCULAR | Status: DC | PRN
Start: 2017-05-10 — End: 2017-05-10
  Administered 2017-05-10: 6 mL

## 2017-05-10 MED ORDER — DEXTROSE 5 % IV SOLN
3.0000 g | INTRAVENOUS | Status: AC
  Administered 2017-05-10: 3 g via INTRAVENOUS
  Filled 2017-05-10: qty 3000

## 2017-05-10 MED ORDER — 0.9 % SODIUM CHLORIDE (POUR BTL) OPTIME
TOPICAL | Status: DC | PRN
  Administered 2017-05-10: 1000 mL

## 2017-05-10 MED ORDER — DARBEPOETIN ALFA 100 MCG/0.5ML IJ SOSY
100.0000 ug | PREFILLED_SYRINGE | INTRAMUSCULAR | Status: DC
  Administered 2017-05-11: 100 ug via INTRAVENOUS
  Filled 2017-05-10: qty 0.5

## 2017-05-10 MED ORDER — ACETAMINOPHEN 325 MG PO TABS: 325.0000 mg | ORAL_TABLET | ORAL | Status: DC | PRN

## 2017-05-10 MED ORDER — ONDANSETRON HCL 4 MG/2ML IJ SOLN
INTRAMUSCULAR | Status: DC | PRN
  Administered 2017-05-10: 4 mg via INTRAVENOUS

## 2017-05-10 MED ORDER — ONDANSETRON HCL 4 MG/2ML IJ SOLN: 4.0000 mg | Freq: Once | INTRAMUSCULAR | Status: DC | PRN

## 2017-05-10 MED ORDER — OXYCODONE HCL 5 MG/5ML PO SOLN: 5.0000 mg | Freq: Once | ORAL | Status: DC | PRN

## 2017-05-10 MED ORDER — PROPOFOL 10 MG/ML IV BOLUS
INTRAVENOUS | Status: AC
  Filled 2017-05-10: qty 20

## 2017-05-10 MED ORDER — ACETAMINOPHEN 160 MG/5ML PO SOLN: 325.0000 mg | ORAL | Status: DC | PRN

## 2017-05-10 MED ORDER — KETOROLAC TROMETHAMINE 30 MG/ML IJ SOLN: 30.0000 mg | Freq: Once | INTRAMUSCULAR | Status: DC | PRN

## 2017-05-10 MED ORDER — MIDAZOLAM HCL 2 MG/2ML IJ SOLN
INTRAMUSCULAR | Status: AC
  Filled 2017-05-10: qty 2

## 2017-05-10 MED ORDER — SODIUM CHLORIDE 0.9 % IV SOLN
INTRAVENOUS | Status: DC | PRN
  Administered 2017-05-10: 08:00:00 500 mL

## 2017-05-10 MED ORDER — OXYCODONE HCL 5 MG PO TABS: 5.0000 mg | ORAL_TABLET | Freq: Once | ORAL | Status: DC | PRN

## 2017-05-10 MED ORDER — DEXAMETHASONE SODIUM PHOSPHATE 10 MG/ML IJ SOLN
INTRAMUSCULAR | Status: AC
Start: 2017-05-10 — End: ?
  Filled 2017-05-10: qty 1

## 2017-05-10 SURGICAL SUPPLY — 31 items
ADH SKN CLS APL DERMABOND .7 (GAUZE/BANDAGES/DRESSINGS) ×1
ARMBAND PINK RESTRICT EXTREMIT (MISCELLANEOUS) ×6 IMPLANT
CANISTER SUCT 3000ML PPV (MISCELLANEOUS) ×3 IMPLANT
CANNULA VESSEL 3MM 2 BLNT TIP (CANNULA) ×3 IMPLANT
CLIP LIGATING EXTRA MED SLVR (CLIP) ×3 IMPLANT
CLIP LIGATING EXTRA SM BLUE (MISCELLANEOUS) ×3 IMPLANT
COVER PROBE W GEL 5X96 (DRAPES) ×3 IMPLANT
DECANTER SPIKE VIAL GLASS SM (MISCELLANEOUS) ×3 IMPLANT
DERMABOND ADVANCED (GAUZE/BANDAGES/DRESSINGS) ×2
DERMABOND ADVANCED .7 DNX12 (GAUZE/BANDAGES/DRESSINGS) ×1 IMPLANT
ELECT REM PT RETURN 9FT ADLT (ELECTROSURGICAL) ×3
ELECTRODE REM PT RTRN 9FT ADLT (ELECTROSURGICAL) ×1 IMPLANT
GLOVE BIO SURGEON STRL SZ 6.5 (GLOVE) ×6 IMPLANT
GLOVE BIO SURGEONS STRL SZ 6.5 (GLOVE) ×3
GLOVE BIOGEL PI IND STRL 6.5 (GLOVE) IMPLANT
GLOVE BIOGEL PI INDICATOR 6.5 (GLOVE) ×2
GLOVE SS BIOGEL STRL SZ 7.5 (GLOVE) ×1 IMPLANT
GLOVE SUPERSENSE BIOGEL SZ 7.5 (GLOVE) ×2
GOWN STRL REUS W/ TWL LRG LVL3 (GOWN DISPOSABLE) ×3 IMPLANT
GOWN STRL REUS W/TWL LRG LVL3 (GOWN DISPOSABLE) ×9
KIT BASIN OR (CUSTOM PROCEDURE TRAY) ×3 IMPLANT
KIT ROOM TURNOVER OR (KITS) ×3 IMPLANT
NS IRRIG 1000ML POUR BTL (IV SOLUTION) ×3 IMPLANT
PACK CV ACCESS (CUSTOM PROCEDURE TRAY) ×3 IMPLANT
PAD ARMBOARD 7.5X6 YLW CONV (MISCELLANEOUS) ×6 IMPLANT
SUT PROLENE 6 0 CC (SUTURE) ×3 IMPLANT
SUT VIC AB 3-0 SH 27 (SUTURE) ×3
SUT VIC AB 3-0 SH 27X BRD (SUTURE) ×1 IMPLANT
TOWEL GREEN STERILE (TOWEL DISPOSABLE) ×3 IMPLANT
UNDERPAD 30X30 (UNDERPADS AND DIAPERS) ×3 IMPLANT
WATER STERILE IRR 1000ML POUR (IV SOLUTION) ×3 IMPLANT

## 2017-05-10 NOTE — Anesthesia Procedure Notes (Signed)
Procedure Name: MAC Date/Time: 05/10/2017 7:40 AM Performed by: Izora Gala, CRNA Pre-anesthesia Checklist: Patient identified Patient Re-evaluated:Patient Re-evaluated prior to induction Placement Confirmation: positive ETCO2

## 2017-05-10 NOTE — Progress Notes (Signed)
PROGRESS NOTE    John Bailey  UQJ:[id number] DOB: 1970/06/15 DOA: 04/22/2017 PCP: [name], MD   Chief Complaint  Patient presents with  . Generalized body swelling    Brief Narrative:  HPI on 04/22/2017 by Dr. Quintella Baton This is a 47 year old gentleman who presents with complaints of weight gain, increasing edema and difficulty walking as a result.  This is a patient with rheumatoid arthritis, diabetes mellitus, chronic fluid overload.  He was started on prednisone approximately 9 weeks ago to treat his rheumatoid arthritis.  At that time his Lasix was continued.  He was maintained on prednisone for 6 weeks.  His Lasix was restarted approximately 3 weeks ago.  With his Lasix being held he has gained weight, he states approximately 15 pounds.  Despite being restarted on his Lasix he is not lost any weight to the point now it has become difficult for him to walk.  He saw his PCP today and was sent him to the ER.  The patient denies any cough, wheeze or chest pains.  He does report increased shortness of breath with activity.  History provided by the patient.  The ER the patient is also hypoxic with ambulation, his oxygen sats going down to 80% with ambulation.  Interim history  Admitted for Acute CHF. Was placed on IV Lasix however this was discontinued as patient developed worsening renal failure.  Nephrology consulted and appreciated, patient started dialysis.   Assessment & Plan   Acute on chronic diastolic heart failure -Echocardiogram showed an EF of 55-60% with severe LVH, dilated left atrium and trivial pericardial effusion.  Unable to assess diastolic dysfunction. -Patient was started on IV Lasix however this was discontinued due to poor urine output and worsening creatinine  -Nephrology was consulted and appreciated, patient started dialysis and continues to have HD almost daily- had dialyzed today and next HD will be on 2/25 -Vascular surgery consulted and appreciated,  for permanent HD access placement- placed today 05/10/2017 -UNNA boots were removed  -patient has lost about 95lbs thus far  Anasarca -Secondary to volume overload  -continue management with dialysis  Rheumatoid arthritis with flare -Patient receives Abatacept every Tuesday -knee x-rays negative for knee effusion -Continue methocarbamol, gabapentin -Dr. Alfredia Ferguson, previous hospitalist discussed with Dr. 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  -Dr. Donnetta Hutching, vascular surgery, consulted and appreciated, L AVF placed today -discussed with Dr. Marylou Flesher, nephrology, feels this is now ESRD, CLIP in process  -patient inquires about home dialysis  Hypertension -Blood pressure currently stable -Cardiology, Dr. Caryl Comes checking to rule out amyloid given the degree of LVH -Immunoelectrophoresis to check for amyloid however unable to collect 24-hour urine due to worsening renal failure  Hyperkalemia/hypokalemia -K 3.4 after repletion, will continue to replace -magnesium 2  -Continue to monitor BMP  Leukocytosis -Possibly secondary to RA flare versus steroids -Previous hospitalist discussed with infectious  disease, Dr. Drucilla Schmidt  who believes WBC to be related to RA instead of infectio  Diabetes mellitus, type II -Continue Lantus and insulin sliding scale with CBG monitoring  Hyperphosphatemia  -continue dialysis management and monitor  Diarrhea -C. difficile checked and was unremarkable, GI pathogen panel negative -Continue to monitor  Physical deconditioning -PT/OT consulted and PT recommended SNF, OT recommended CIR -CIR consulted- potential candidate -Social work consulted  DVT Prophylaxis heparin  Code Status: Full  Family Communication: none at bedside  Disposition Plan: Admitted. Pending CLIP process and possible access placement next week  Consultants Cardiology/heart failure team Nephrology  Interventional radiology Infectious disease via phone Rheumatology via phone Vascular surgery Inpatient rehab  Procedures  Echocardiogram Right upper extremity vein mapping RIJ TDC, RIJ vein cannulation under [place], removal of RIJ vein temp dialysis catheter Left radiocephalic AV fistula  Antibiotics   Anti-infectives (From admission, onward)   Start     Dose/Rate Route Frequency Ordered Stop   05/10/17 0730  ceFAZolin (ANCEF) 3 g in dextrose 5 % 50 mL IVPB     3 g 160 mL/hr over 30 Minutes Intravenous To ShortStay Surgical 05/10/17 0721 05/10/17 0757   05/04/17 1215  ceFAZolin (ANCEF) 3 g in dextrose 5 % 50 mL IVPB    Comments:  Send with pt to OR   3 g 130 mL/hr over 30 Minutes Intravenous To Surgery 05/04/17 1202 05/04/17 1450   05/04/17 0600  ceFAZolin (ANCEF) IVPB 1 g/50 mL premix  Status:  Discontinued    Comments:  Send with pt to OR   1 g 100 mL/hr over 30 Minutes Intravenous To Surgery 05/03/17 1606 05/04/17 1202      Subjective:   John Bailey seen and examined today.  No complaints today.  Patient denies any current chest pain, shortness breath, abdominal pain, nausea vomiting, diarrhea or constipation. Objective:   Vitals:   05/10/17 0911 05/10/17 0926 05/10/17 0934  05/10/17 1151  BP: 137/86 136/88  137/90  Pulse: 73 72 73 69  Resp: 14 19 19    Temp:   97.7 F (36.5 C) (!) 97.5 F (36.4 C)  TempSrc:    Axillary  SpO2: 95% 94% 96%   Weight:      Height:        Intake/Output Summary (Last 24 hours) at 05/10/2017 1353 Last data filed at 05/10/2017 0830 Gross per 24 hour  Intake 785.48 ml  Output 30 ml  Net 755.48 ml   Filed Weights   05/08/17 1230 05/09/17 0339 05/10/17 0436  Weight: 133.3 kg (293 lb 14 oz) 133 kg (293 lb 3.4 oz) 133.1 kg (293 lb 6.9 oz)   Exam  General: Well developed, well nourished, NAD, appears stated age  HEENT: NCAT, mucous membranes moist.   Neck: Supple  Cardiovascular: S1 S2 auscultated, no rubs, murmurs or gallops. Regular rate and rhythm.  Respiratory: Clear to auscultation bilaterally with equal chest rise  Abdomen: Soft, nontender, nondistended, + bowel sounds  Extremities: warm dry without cyanosis clubbing. +LE edema B/L- improving   Neuro: AAOx3, nonfocal, weak upper extremities  Psych: Normal affect and demeanor with intact judgement and insight  Data Reviewed: I have personally reviewed following labs and imaging studies  CBC: Recent Labs  Lab 05/04/17 0540 05/05/17 0430 05/06/17 0745 05/07/17 0516 05/08/17 0337 05/09/17 0531 05/10/17 0943  WBC 33.3* 26.8* 33.1* 27.1* 35.0* 34.9* 33.5*  NEUTROABS 30.6* 23.0*  --   --   --  29.7*  --   HGB 9.7* 9.8*  10.2* 9.8* 9.8* 10.2* 9.7*  HCT 29.1* 29.2* 30.6* 29.4* 29.9* 30.5* 30.2*  MCV 83.4 83.4 84.1 84.5 83.8 84.3 83.7  PLT 539* 530* 545* 484* 463* 540* 979*   Basic Metabolic Panel: Recent Labs  Lab 05/04/17 0540 05/05/17 0430 05/05/17 1701 05/06/17 0745 05/07/17 0516 05/07/17 2215 05/08/17 0337 05/08/17 1553 05/09/17 0531 05/10/17 0943  NA 131* 133* 134* 133* 135  --  132*  --  135 134*  K 3.5 3.1* 3.4* 3.1* 2.6* 3.0* 2.4* 2.8* 2.8* 3.4*  CL 94* 97* 98* 97* 99*  --  95*  --  99* 100*  CO2 21* 21* 22 21* 22  --  23  --  23 21*    GLUCOSE 217* 138* 154* 183* 127*  --  231*  --  143* 208*  BUN 58* 50* 31* 37* 25*  --  21*  --  22* 41*  CREATININE 5.58* 5.42* 4.09* 4.84* 4.33*  --  4.03*  --  4.00* 5.32*  CALCIUM 8.5* 8.3* 8.2* 8.4* 8.3*  --  8.3*  --  8.6* 8.7*  MG 2.3 2.2  --   --   --   --   --   --  2.0  --   PHOS 6.9* 6.1* 3.6  --   --   --   --   --   --   --    GFR: Estimated Creatinine Clearance: 25 mL/min (A) (by C-G formula based on SCr of 5.32 mg/dL (H)). Liver Function Tests: Recent Labs  Lab 05/04/17 0540 05/05/17 0430 05/05/17 1701  AST 12* 12*  --   ALT 8* 7*  --   ALKPHOS 70 74  --   BILITOT 0.6 0.6  --   PROT 6.1* 5.9*  --   ALBUMIN 2.3* 2.4* 2.5*   No results for input(s): LIPASE, AMYLASE in the last 168 hours. No results for input(s): AMMONIA in the last 168 hours. Coagulation Profile: Recent Labs  Lab 05/04/17 0540  INR 1.34   Cardiac Enzymes: No results for input(s): CKTOTAL, CKMB, CKMBINDEX, TROPONINI in the last 168 hours. BNP (last 3 results) No results for input(s): PROBNP in the last 8760 hours. HbA1C: No results for input(s): HGBA1C in the last 72 hours. CBG: Recent Labs  Lab 05/09/17 1647 05/09/17 2105 05/10/17 0550 05/10/17 0859 05/10/17 1148  GLUCAP 220* 231* 171* 204* 190*   Lipid Profile: No results for input(s): CHOL, HDL, LDLCALC, TRIG, CHOLHDL, LDLDIRECT in the last 72 hours. Thyroid Function Tests: No results for input(s): TSH, T4TOTAL, FREET4, T3FREE, THYROIDAB in the last 72 hours. Anemia Panel: No results for input(s): VITAMINB12, FOLATE, FERRITIN, TIBC, IRON, RETICCTPCT in the last 72 hours. Urine analysis:    Component Value Date/Time   COLORURINE YELLOW 04/25/2017 2057   APPEARANCEUR CLEAR 04/25/2017 2057   LABSPEC 1.010 04/25/2017 2057   PHURINE 5.0 04/25/2017 2057   GLUCOSEU NEGATIVE 04/25/2017 2057   HGBUR NEGATIVE 04/25/2017 2057   BILIRUBINUR NEGATIVE 04/25/2017 2057   KETONESUR NEGATIVE 04/25/2017 2057   PROTEINUR NEGATIVE 04/25/2017  2057   NITRITE NEGATIVE 04/25/2017 2057   LEUKOCYTESUR NEGATIVE 04/25/2017 2057   Sepsis Labs: @LABRCNTIP (procalcitonin:4,lacticidven:4)  ) Recent Results (from the past 240 hour(s))  Gastrointestinal Panel by PCR , Stool     Status: None   Collection Time: 05/02/17  2:42 PM  Result Value Ref Range Status   Campylobacter species NOT DETECTED NOT DETECTED Final   Plesimonas shigelloides NOT DETECTED NOT DETECTED Final   Salmonella species  NOT DETECTED NOT DETECTED Final   Yersinia enterocolitica NOT DETECTED NOT DETECTED Final   Vibrio species NOT DETECTED NOT DETECTED Final   Vibrio cholerae NOT DETECTED NOT DETECTED Final   Enteroaggregative E coli (EAEC) NOT DETECTED NOT DETECTED Final   Enteropathogenic E coli (EPEC) NOT DETECTED NOT DETECTED Final   Enterotoxigenic E coli (ETEC) NOT DETECTED NOT DETECTED Final   Shiga like toxin producing E coli (STEC) NOT DETECTED NOT DETECTED Final   Shigella/Enteroinvasive E coli (EIEC) NOT DETECTED NOT DETECTED Final   Cryptosporidium NOT DETECTED NOT DETECTED Final   Cyclospora cayetanensis NOT DETECTED NOT DETECTED Final   Entamoeba histolytica NOT DETECTED NOT DETECTED Final   Giardia lamblia NOT DETECTED NOT DETECTED Final   Adenovirus F40/41 NOT DETECTED NOT DETECTED Final   Astrovirus NOT DETECTED NOT DETECTED Final   Norovirus GI/GII NOT DETECTED NOT DETECTED Final   Rotavirus A NOT DETECTED NOT DETECTED Final   Sapovirus (I, II, IV, and V) NOT DETECTED NOT DETECTED Final    Comment: Performed at [facility], [address] [place], [place] [address]  C difficile quick scan w PCR reflex     Status: None   Collection Time: 05/02/17  2:42 PM  Result Value Ref Range Status   C Diff antigen NEGATIVE NEGATIVE Final   C Diff toxin NEGATIVE NEGATIVE Final   C Diff interpretation No C. difficile detected.  Final    Comment: Performed at [facility], [place] [address], [place], [place] [address]  Surgical pcr screen      Status: None   Collection Time: 05/04/17  6:53 AM  Result Value Ref Range Status   MRSA, PCR NEGATIVE NEGATIVE Final   Staphylococcus aureus NEGATIVE NEGATIVE Final    Comment: (NOTE) The Xpert SA Assay (FDA approved for NASAL specimens in patients 62 years of age and older), is one component of a comprehensive surveillance program. It is not intended to diagnose infection nor to guide or monitor treatment. Performed at [facility], [place] [address], [place], [place] [address]       Radiology Studies: Dg Chest Port 1 View  Result Date: 05/04/2017 CLINICAL DATA:  Dialysis catheter placement. EXAM: PORTABLE CHEST 1 VIEW COMPARISON:  04/29/2017. FINDINGS: Right IJ dual-lumen catheter noted with tip at the cavoatrial junction. Cardiomegaly with mild pulmonary vascular prominence. No focal infiltrate. No pleural effusion or pneumothorax. IMPRESSION: 1. Right IJ dual-lumen catheter noted with tip at the cavoatrial junction. 2.  Cardiomegaly with mild pulmonary venous congestion. Electronically Signed   By: Marcello Moores  Register   On: 05/04/2017 16:23   Dg Fluoro Guide Cv Line-no Report  Result Date: 05/04/2017 Fluoroscopy was utilized by the requesting physician.  No radiographic interpretation.   Vas [place] Upper Extremity Arterial Duplex  Result Date: 05/03/2017 UPPER EXTREMITY DUPLEX STUDY Indications: Pre-op for AVF. Examination Guidelines: A complete evaluation includes B-mode imaging, spectral doppler, color doppler, and power doppler as needed of all accessible portions of each vessel. Bilateral testing is considered an integral part of a complete examination. Limited examinations for reoccurring indications may be performed as noted.  Right Pre-Dialysis Findings: +-----------------+----------+------------------+---------+--------------------+ Location         PSV (cm/s)Intralum. Diam.   Waveform Comments                                        (mm)                                             +-----------------+----------+------------------+---------+--------------------+  Brachial Antecub.50                          triphasicproximal upper arm   fossa                                                                      +-----------------+----------+------------------+---------+--------------------+ Radial Art at    51        2.60              biphasic high bifurcation in  Wrist                                                 the upper arm        +-----------------+----------+------------------+---------+--------------------+ Ulnar Art at     44        3.00              biphasic high bifurcation in  Wrist                                                 the upper arm        +-----------------+----------+------------------+---------+--------------------+ Left Pre-Dialysis Findings: +-----------------------+----------+--------------------+---------+--------+ Location               PSV (cm/s)Intralum. Diam. (mm)Waveform Comments +-----------------------+----------+--------------------+---------+--------+ Brachial Antecub. fossa47        6.20                triphasic         +-----------------------+----------+--------------------+---------+--------+ Radial Art at Wrist    67        3.20                                  +-----------------------+----------+--------------------+---------+--------+ Ulnar Art at Wrist     73        4.70                triphasic         +-----------------------+----------+--------------------+---------+--------+ Final Interpretation:  Right: No obstruction visualized in the right upper extremity. Left: No obstruction visualized in the left upper extremity. *See table(s) above for measurements and observations. Electronically signed by Curt Jews on 05/03/2017 at 7:11:24 PM.   Vas [place] Upper Ext Vein Mapping (pre-op Avf)  Result Date: 05/03/2017 UPPER EXTREMITY VEIN MAPPING History: Pre-access. Examination  Guidelines: A complete evaluation includes B-mode imaging, spectral doppler, color doppler, and power doppler as needed of all accessible portions of each vessel. Bilateral testing is considered an integral part of a complete examination. Limited examinations for reoccurring indications may be performed as noted. +-----------------+-------------+----------+---------+ Right Cephalic   Diameter (mm)Depth (mm)Findings  +-----------------+-------------+----------+---------+ Shoulder             3.80        6.30             +-----------------+-------------+----------+---------+ Prox upper arm  3.10       11.80             +-----------------+-------------+----------+---------+ Mid upper arm                           bandages  +-----------------+-------------+----------+---------+ Dist upper arm       2.40        4.60             +-----------------+-------------+----------+---------+ Antecubital fossa    1.70        9.40             +-----------------+-------------+----------+---------+ Prox forearm         3.80        9.60             +-----------------+-------------+----------+---------+ Mid forearm          2.90        9.40   branching +-----------------+-------------+----------+---------+ Dist forearm         3.20        2.80             +-----------------+-------------+----------+---------+ Wrist                1.90        2.70             +-----------------+-------------+----------+---------+ +-----------------+-------------+----------+--------------+ Right Basilic    Diameter (mm)Depth (mm)   Findings    +-----------------+-------------+----------+--------------+ Shoulder                                not visualized +-----------------+-------------+----------+--------------+ Prox upper arm                          not visualized +-----------------+-------------+----------+--------------+ Mid upper arm                           not visualized  +-----------------+-------------+----------+--------------+ Dist upper arm                          not visualized +-----------------+-------------+----------+--------------+ Antecubital fossa                       not visualized +-----------------+-------------+----------+--------------+ Prox forearm         2.10                              +-----------------+-------------+----------+--------------+ Mid forearm          1.50                              +-----------------+-------------+----------+--------------+ Distal forearm       1.60                              +-----------------+-------------+----------+--------------+ Wrist                1.40                              +-----------------+-------------+----------+--------------+ +-----------------+-------------+----------+---------+ Left Cephalic    Diameter (mm)Depth (mm)Findings  +-----------------+-------------+----------+---------+ Shoulder  3.10       20.00             +-----------------+-------------+----------+---------+ Prox upper arm       3.60       13.50             +-----------------+-------------+----------+---------+ Mid upper arm        2.90       10.00             +-----------------+-------------+----------+---------+ Dist upper arm       2.70       10.00             +-----------------+-------------+----------+---------+ Antecubital fossa    3.70        3.00             +-----------------+-------------+----------+---------+ Prox forearm         2.80        8.50   branching +-----------------+-------------+----------+---------+ Mid forearm          2.90        4.90             +-----------------+-------------+----------+---------+ Dist forearm         2.80        4.20             +-----------------+-------------+----------+---------+ Wrist                3.50        4.10             +-----------------+-------------+----------+---------+  +-----------------+-------------+----------+--------------+ Left Basilic     Diameter (mm)Depth (mm)   Findings    +-----------------+-------------+----------+--------------+ Shoulder                                not visualized +-----------------+-------------+----------+--------------+ Prox upper arm                          not visualized +-----------------+-------------+----------+--------------+ Mid upper arm                           not visualized +-----------------+-------------+----------+--------------+ Dist upper arm                          not visualized +-----------------+-------------+----------+--------------+ Antecubital fossa    3.40                              +-----------------+-------------+----------+--------------+ Prox forearm         4.00                              +-----------------+-------------+----------+--------------+ Mid forearm          1.40                              +-----------------+-------------+----------+--------------+ Distal forearm                          not visualized +-----------------+-------------+----------+--------------+ Elbow                                   not visualized +-----------------+-------------+----------+--------------+ Wrist  not visualized +-----------------+-------------+----------+--------------+ Final Interpretation: *See table(s) above for measurements and observations.  Curt Jews Electronically signed by Curt Jews on 05/03/2017 at 7:10:36 PM.      Scheduled Meds: . amiodarone  200 mg Oral BID  . carvedilol  18.75 mg Oral BID WC  . [START ON 05/11/2017] darbepoetin (ARANESP) injection - DIALYSIS  100 mcg Intravenous Q Tue-HD  . fluticasone  2 spray Each Nare Daily  . gabapentin  300 mg Oral Daily  . insulin aspart  0-15 Units Subcutaneous TID WC  . insulin aspart  0-5 Units Subcutaneous QHS  . insulin glargine  25 Units Subcutaneous QHS  .  multivitamin  1 tablet Oral QHS  . predniSONE  20 mg Oral Q breakfast  . sodium chloride flush  3 mL Intravenous Q12H  . sodium chloride flush  3 mL Intravenous Q12H   Continuous Infusions: . sodium chloride    . sodium chloride    . sodium chloride 10 mL/hr at 05/04/17 1220  . heparin 1,950 Units/hr (05/10/17 1302)     LOS: 18 days   Time Spent in minutes   30 minutes  Aasiya Creasey D.O. on 05/10/2017 at 1:53 PM  Between 7am to 7pm - Pager - [phone]  After 7pm go to [email or web address] - password [email or web address]  And look for the night coverage person covering for me after hours  [facility] Office  [phone]

## 2017-05-10 NOTE — Interval H&P Note (Signed)
History and Physical Interval Note:  05/10/2017 7:19 AM  John Bailey  has presented today for surgery, with the diagnosis of END STAGE RENAL DISEASE FOR HEMODIALYSIS ACCESS  The various methods of treatment have been discussed with the patient and family. After consideration of risks, benefits and other options for treatment, the patient has consented to  Procedure(s): ARTERIOVENOUS (AV) FISTULA CREATION VERSUS GRAFT LEFT ARM (Left) as a surgical intervention .  The patient's history has been reviewed, patient examined, no change in status, stable for surgery.  I have reviewed the patient's chart and labs.  Questions were answered to the patient's satisfaction.     Curt Jews

## 2017-05-10 NOTE — Progress Notes (Signed)
Inpatient Rehabilitation  Note patient in surgery today for HD access.  Following along for timing of medical readiness, therapy tolerance, insurance authorization, and IP Rehab bed availability.  Call if questions.    Carmelia Roller., CCC/SLP Admission Coordinator  [facility]  Cell [phone]

## 2017-05-10 NOTE — Progress Notes (Signed)
ANTICOAGULATION CONSULT NOTE - Follow Up Consult  Pharmacy Consult for Heparin Indication: atrial fibrillation  No Known Allergies  Patient Measurements: Height: 6' (182.9 cm) Weight: 293 lb 6.9 oz (133.1 kg) IBW/kg (Calculated) : 77.6  Heparin dosing weight: 111 kg  Vital Signs: Temp: 98.5 F (36.9 C) (02/25 1959) Temp Source: Axillary (02/25 1959) BP: 116/76 (02/25 1959) Pulse Rate: 63 (02/25 1959)  Labs: Recent Labs    05/07/17 2215  05/08/17 0337 05/09/17 0531 05/10/17 0943 05/10/17 1853  HGB  --    < > 9.8* 10.2* 9.7*  --   HCT  --   --  29.9* 30.5* 30.2*  --   PLT  --   --  463* 540* 540*  --   APTT 73*  --  99* 86*  --   --   HEPARINUNFRC  --    < > 0.65 0.35 <0.10* 0.35  CREATININE  --   --  4.03* 4.00* 5.32*  --    < > = values in this interval not displayed.   Assessment: 47 year old male with Afib on Xarelto PTA. Patient is now ESRD to start HD. He was transitioned to Eliquis and received two doses on 2/20. Needs permanent HD access so Eliquis held and he was started on IV heparin.  S/P AVF placement today. Heparin to resumed at 1300. A heparin level this evening is therapeutic (HL 0.35, goal of 0.3-0.7). No bleeding noted at this time.   Goal of Therapy:  Heparin level 0.3-0.7 units/ml Monitor platelets by anticoagulation protocol: Yes    Plan:  1. Continue Heparin at 1950 units/hr (19.5 ml/hr) 2. Will continue to monitor for any signs/symptoms of bleeding and will follow up with heparin level in the a.m to confirm  Thank you for allowing pharmacy to be a part of this patient's care.  Alycia Rossetti, PharmD, BCPS Clinical Pharmacist Pager: [phone] Clinical phone for 05/10/2017: [phone] 05/10/2017 8:17 PM

## 2017-05-10 NOTE — Progress Notes (Signed)
ANTICOAGULATION CONSULT NOTE - Follow Up Consult  Pharmacy Consult for Heparin Indication: atrial fibrillation  No Known Allergies  Patient Measurements: Height: 6' (182.9 cm) Weight: 293 lb 6.9 oz (133.1 kg) IBW/kg (Calculated) : 77.6  Heparin dosing weight: 111 kg  Vital Signs: Temp: 97.7 F (36.5 C) (02/25 0934) Temp Source: Oral (02/25 0436) BP: 136/88 (02/25 0926) Pulse Rate: 73 (02/25 0934)  Labs: Recent Labs    05/07/17 2215 05/08/17 0337 05/09/17 0531  HGB  --  9.8* 10.2*  HCT  --  29.9* 30.5*  PLT  --  463* 540*  APTT 73* 99* 86*  HEPARINUNFRC  --  0.65 0.35  CREATININE  --  4.03* 4.00*   Assessment: 47 year old male with Afib on Xarelto PTA. Patient is now ESRD to start HD. He was transitioned to Eliquis and received two doses on 2/20. Needs permanent HD access so Eliquis held and he was started on IV heparin.  S/P AVF placement today. Heparin to resume at 1pm.  Goal of Therapy:  Heparin level 0.3-0.7 units/ml Monitor platelets by anticoagulation protocol: Yes    Plan:  1) At 1pm today, resume heparin at 1950 units/hr 2) Check 6 hour heparin level 3) Follow up resuming oral AC   Nena Jordan, PharmD, BCPS 05/10/2017, 10:21 AM

## 2017-05-10 NOTE — Transfer of Care (Signed)
Immediate Anesthesia Transfer of Care Note  Patient: John Bailey  Procedure(s) Performed: CREATION of Left Radicephalic Fistula (Left Arm Lower)  Patient Location: PACU  Anesthesia Type:MAC  Level of Consciousness: awake, alert , oriented and patient cooperative  Airway & Oxygen Therapy: Patient Spontanous Breathing  Post-op Assessment: Report given to RN, Post -op Vital signs reviewed and stable, Patient moving all extremities and Patient moving all extremities X 4  Post vital signs: Reviewed and stable  Last Vitals:  Vitals:   05/10/17 0436 05/10/17 0856  BP: 134/81 139/87  Pulse: 73 76  Resp:  13  Temp: 36.8 C   SpO2: 95% 96%    Last Pain:  Vitals:   05/10/17 0436  TempSrc: Oral  PainSc: 0-No pain      Patients Stated Pain Goal: 0 (81/10/31 5945)  Complications: No apparent anesthesia complications

## 2017-05-10 NOTE — Progress Notes (Signed)
Subjective: Interval History: has no complaint .  Objective: Vital signs in last 24 hours: Temp:  [97.5 F (36.4 C)-98.9 F (37.2 C)] 97.5 F (36.4 C) (02/25 1151) Pulse Rate:  [69-79] 69 (02/25 1151) Resp:  [13-30] 19 (02/25 0934) BP: (124-139)/(74-90) 137/90 (02/25 1151) SpO2:  [94 %-100 %] 96 % (02/25 0934) Weight:  [133.1 kg (293 lb 6.9 oz)] 133.1 kg (293 lb 6.9 oz) (02/25 0436) Weight change: -4.5 kg (-14.7 oz)  Intake/Output from previous day: 02/24 0701 - 02/25 0700 In: 585.5 [P.O.:120; I.V.:465.5] Out: 25 [Urine:25] Intake/Output this shift: Total I/O In: 200 [I.V.:200] Out: 5 [Blood:5]  General appearance: alert, cooperative, no distress and moderately obese Resp: diminished breath sounds bilaterally and rales bibasilar Chest wall: IJ PC Cardio: S1, S2 normal and systolic murmur: holosystolic 2/6, blowing at apex GI: obese, pos bs, liver down 6 cm Extremities: edema 3+  Lab Results: Recent Labs    05/09/17 0531 05/10/17 0943  WBC 34.9* 33.5*  HGB 10.2* 9.7*  HCT 30.5* 30.2*  PLT 540* 540*   BMET:  Recent Labs    05/09/17 0531 05/10/17 0943  NA 135 134*  K 2.8* 3.4*  CL 99* 100*  CO2 23 21*  GLUCOSE 143* 208*  BUN 22* 41*  CREATININE 4.00* 5.32*  CALCIUM 8.6* 8.7*   No results for input(s): PTH in the last 72 hours. Iron Studies: No results for input(s): IRON, TIBC, TRANSFERRIN, FERRITIN in the last 72 hours.  Studies/Results: No results found.  I have reviewed the patient's current medications.  Assessment/Plan: 1 ESRD HD tomorrow. Still vol xs, will require 4 1/2 h at least 2 DM controlled 3 Anemia esa. 4 HPTH needs Fe check 5 Obesity 6 RA. 7 CHF controlled 8 HTN lower dry 9 Hx Afib P HD, esa, check Fe, PTH    LOS: 18 days   John Bailey 05/10/2017,12:10 PM

## 2017-05-10 NOTE — Op Note (Signed)
    OPERATIVE REPORT  DATE OF SURGERY: 05/10/2017  PATIENT: John Bailey, 47 y.o. male MRN: [id number]  DOB: 1970-06-09  PRE-OPERATIVE DIAGNOSIS: End-stage renal disease  POST-OPERATIVE DIAGNOSIS:  Same  PROCEDURE: Left radiocephalic AV fistula  SURGEON:  Curt Jews, M.D.  PHYSICIAN ASSISTANT: Liana Crocker PA-C  ANESTHESIA: Local with sedation  EBL: Minimal ml  Total I/O In: 200 [I.V.:200] Out: 5 [Blood:5]  BLOOD ADMINISTERED: None  DRAINS: None  SPECIMEN: None  COUNTS CORRECT:  YES  PLAN OF CARE: PACU  PATIENT DISPOSITION:  PACU - hemodynamically stable  PROCEDURE DETAILS: The patient was taken to the operating placed in supine position with the area of the left arm was prepped and draped in usual sterile fashion.  Preoperative vein mapping showed moderate sized cephalic vein in the upper arm and poor visualization in the forearm.  I imaged his veins with SonoSite ultrasound.  The vein was very nice size from above the antecubital space.  The cephalic vein was felt to be very nice option for forearm radiocephalic fistula  Using local anesthesia incision was made between the level of the radial artery and the cephalic vein at the wrist.  The cephalic vein was of very good caliber and was mobilized.  The vein was ligated distally and.  The radial artery was also very good vein was brought into approximation with the radial artery.  The radial artery was occluded proximally and distally and was opened with an 11 blade and scissors.  The vein was cut to the appropriate length and was spatulated and sewn end-to-side to the artery with a running 6-0 Prolene suture.  Labs removed and excellent thrill was noted.  The wounds irrigated with saline.  Hemostasis was obtained with cautery.  The wounds were closed with 3-0 Vicryl in the subcutaneous and subcuticular tissue.  Sterile dressing was applied the patient was transferred to the recovery room in stable condition   Rosetta Posner, M.D., [facility] 05/10/2017 9:13 AM

## 2017-05-10 NOTE — Telephone Encounter (Signed)
Sched appt 06/22/17; lab at 3:00 and MD at 3:45. Lm on cell# to inform pt of appt.

## 2017-05-10 NOTE — Telephone Encounter (Signed)
-----   Message from Mena Goes, RN sent at 05/10/2017  9:46 AM EST ----- Regarding: 4-6 weeks with duplex   ----- Message ----- From: Gabriel Earing, PA-C Sent: 05/10/2017   8:45 AM To: [facility] Charge Pool  S/p left RC AVF 05/10/17.  F/u with Dr. Donnetta Hutching in 4-6 weeks with duplex.  Thanks

## 2017-05-10 NOTE — Anesthesia Postprocedure Evaluation (Signed)
Anesthesia Post Note  Patient: JAPHET MORGENTHALER  Procedure(s) Performed: CREATION of Left Radicephalic Fistula (Left Arm Lower)     Patient location during evaluation: PACU Anesthesia Type: MAC Level of consciousness: awake and alert Pain management: pain level controlled Vital Signs Assessment: post-procedure vital signs reviewed and stable Respiratory status: spontaneous breathing, nonlabored ventilation, respiratory function stable and patient connected to nasal cannula oxygen Cardiovascular status: stable and blood pressure returned to baseline Postop Assessment: no apparent nausea or vomiting Anesthetic complications: no    Last Vitals:  Vitals:   05/10/17 0856 05/10/17 0911  BP: 139/87 137/86  Pulse: 76 73  Resp: 13 14  Temp: 36.6 C   SpO2: 96% 95%    Last Pain:  Vitals:   05/10/17 0436  TempSrc: Oral  PainSc: 0-No pain                 Kealie Barrie

## 2017-05-10 NOTE — Progress Notes (Signed)
CSW continuing to follow. CSW noted PT/OT recommendation for CIR and inpatient rehab consult. CSW will continue to follow for disposition planning, should patient require SNF.  John Bailey, [place]

## 2017-05-11 ENCOUNTER — Encounter (HOSPITAL_COMMUNITY): Payer: Self-pay | Admitting: Vascular Surgery

## 2017-05-11 LAB — GLUCOSE, CAPILLARY
Glucose-Capillary: 237 mg/dL — ABNORMAL HIGH (ref 65–99)
Glucose-Capillary: 194 mg/dL — ABNORMAL HIGH (ref 65–99)
Glucose-Capillary: 141 mg/dL — ABNORMAL HIGH (ref 65–99)

## 2017-05-11 LAB — CBC
HCT: 29.4 % — ABNORMAL LOW (ref 39.0–52.0)
HEMOGLOBIN: 9.8 g/dL — AB (ref 13.0–17.0)
MCH: 27.8 pg (ref 26.0–34.0)
MCHC: 33.3 g/dL (ref 30.0–36.0)
MCV: 83.3 fL (ref 78.0–100.0)
Platelets: 516 10*3/uL — ABNORMAL HIGH (ref 150–400)
RBC: 3.53 MIL/uL — AB (ref 4.22–5.81)
RDW: 19.1 % — ABNORMAL HIGH (ref 11.5–15.5)
WBC: 29.5 10*3/uL — AB (ref 4.0–10.5)

## 2017-05-11 LAB — RENAL FUNCTION PANEL
ANION GAP: 13 (ref 5–15)
Albumin: 2.4 g/dL — ABNORMAL LOW (ref 3.5–5.0)
BUN: 55 mg/dL — ABNORMAL HIGH (ref 6–20)
CALCIUM: 8.7 mg/dL — AB (ref 8.9–10.3)
CHLORIDE: 102 mmol/L (ref 101–111)
CO2: 21 mmol/L — ABNORMAL LOW (ref 22–32)
Creatinine, Ser: 6.41 mg/dL — ABNORMAL HIGH (ref 0.61–1.24)
GFR calc non Af Amer: 10 mL/min — ABNORMAL LOW (ref >60)
GFR, EST AFRICAN AMERICAN: 11 mL/min — AB (ref >60)
Glucose, Bld: 149 mg/dL — ABNORMAL HIGH (ref 65–99)
Phosphorus: 5.1 mg/dL — ABNORMAL HIGH (ref 2.5–4.6)
Potassium: 3.4 mmol/L — ABNORMAL LOW (ref 3.5–5.1)
SODIUM: 136 mmol/L (ref 135–145)

## 2017-05-11 LAB — IRON AND TIBC
IRON: 44 ug/dL — AB (ref 45–182)
SATURATION RATIOS: 16 % — AB (ref 17.9–39.5)
TIBC: 279 ug/dL (ref 250–450)
UIBC: 235 ug/dL

## 2017-05-11 LAB — PARATHYROID HORMONE, INTACT (NO CA): PTH: 83 pg/mL — ABNORMAL HIGH (ref 15–65)

## 2017-05-11 MED ORDER — LIDOCAINE HCL (PF) 1 % IJ SOLN: 5.0000 mL | INTRAMUSCULAR | Status: DC | PRN

## 2017-05-11 MED ORDER — HEPARIN SODIUM (PORCINE) 1000 UNIT/ML DIALYSIS: 1000.0000 [IU] | INTRAMUSCULAR | Status: DC | PRN

## 2017-05-11 MED ORDER — ALTEPLASE 2 MG IJ SOLR: 2.0000 mg | Freq: Once | INTRAMUSCULAR | Status: DC | PRN

## 2017-05-11 MED ORDER — HEPARIN SODIUM (PORCINE) 1000 UNIT/ML DIALYSIS
20.0000 [IU]/kg | INTRAMUSCULAR | Status: DC | PRN
  Filled 2017-05-11: qty 3

## 2017-05-11 MED ORDER — PENTAFLUOROPROP-TETRAFLUOROETH EX AERO: 1.0000 "application " | INHALATION_SPRAY | CUTANEOUS | Status: DC | PRN

## 2017-05-11 MED ORDER — LIDOCAINE-PRILOCAINE 2.5-2.5 % EX CREA
1.0000 "application " | TOPICAL_CREAM | CUTANEOUS | Status: DC | PRN
  Filled 2017-05-11: qty 5

## 2017-05-11 MED ORDER — SODIUM CHLORIDE 0.9 % IV SOLN: 100.0000 mL | INTRAVENOUS | Status: DC | PRN

## 2017-05-11 MED ORDER — ACETAMINOPHEN 325 MG PO TABS
ORAL_TABLET | ORAL | Status: AC
  Administered 2017-05-11: 650 mg via ORAL
  Filled 2017-05-11: qty 2

## 2017-05-11 MED ORDER — DARBEPOETIN ALFA 100 MCG/0.5ML IJ SOSY
PREFILLED_SYRINGE | INTRAMUSCULAR | Status: AC
  Administered 2017-05-11: 100 ug via INTRAVENOUS
  Filled 2017-05-11: qty 0.5

## 2017-05-11 MED ORDER — LIDOCAINE-PRILOCAINE 2.5-2.5 % EX CREA: 1.0000 "application " | TOPICAL_CREAM | CUTANEOUS | Status: DC | PRN

## 2017-05-11 MED ORDER — APIXABAN 5 MG PO TABS
5.0000 mg | ORAL_TABLET | Freq: Two times a day (BID) | ORAL | Status: DC
  Administered 2017-05-11 – 2017-05-13 (×5): 5 mg via ORAL
  Filled 2017-05-11 (×5): qty 1

## 2017-05-11 MED ORDER — INSULIN ASPART 100 UNIT/ML ~~LOC~~ SOLN
3.0000 [IU] | Freq: Three times a day (TID) | SUBCUTANEOUS | Status: DC
  Administered 2017-05-11 – 2017-05-13 (×6): 3 [IU] via SUBCUTANEOUS

## 2017-05-11 MED ORDER — HEPARIN SODIUM (PORCINE) 1000 UNIT/ML DIALYSIS: 20.0000 [IU]/kg | INTRAMUSCULAR | Status: DC | PRN

## 2017-05-11 MED ORDER — HEPARIN SODIUM (PORCINE) 1000 UNIT/ML DIALYSIS: 100.0000 [IU]/kg | INTRAMUSCULAR | Status: DC | PRN

## 2017-05-11 NOTE — Procedures (Signed)
I was present at this session.  I have reviewed the session itself and made appropriate changes.  HD via [facility] 2/26/20199:04 AM

## 2017-05-11 NOTE — Plan of Care (Signed)
VSS and WNL 

## 2017-05-11 NOTE — Progress Notes (Signed)
Physical Therapy Treatment Patient Details Name: John Bailey MRN: [id number] DOB: [birth year] Today's Date: 05/11/2017    History of Present Illness John Bailey is a 47 y.o. rheumatoid arthritis, diabetes mellitus, heart failure, atrial fibrillation. Patient presented with dyspnea and found to have a CHF exacerbation    PT Comments    Patient received in bed, pleasant and willing to work with skilled PT services today. He was able to complete functional bed mobility with min guard, but continues to require ModAx2 for safe functional transfers during sit to stand and for stand-pivot transfer to chair. Performed long arc quads and resisted seated clams in chair x10 each exercise. He was left up in the chair with all needs met this morning, RN aware of patient status and that lift pad is under patient for use in return to bed.    Follow Up Recommendations  CIR;Supervision/Assistance - 24 hour     Equipment Recommendations  Other (comment)    Recommendations for Other Services OT consult     Precautions / Restrictions Precautions Precautions: Fall Restrictions Weight Bearing Restrictions: No    Mobility  Bed Mobility Overal bed mobility: Needs Assistance Bed Mobility: Supine to Sit Rolling: Min guard         General bed mobility comments: able to get to EOB with min guard today, needed to sit at EOB for awhile to stabilize and get ready for further mobiilty before standing up   Transfers Overall transfer level: Needs assistance Equipment used: 2 person hand held assist Transfers: Sit to/from [facility] Sit to Stand: Mod assist;+2 physical assistance;+2 safety/equipment Stand pivot transfers: Mod assist;+2 physical assistance;+2 safety/equipment       General transfer comment: able to come to standing position and perform stand-pivot with extended time today, ModAx2 for safety   Ambulation/Gait             General Gait Details: pivotal  steps during stand-pivot transfer    Stairs            Wheelchair Mobility    Modified Rankin (Stroke Patients Only)       Balance Overall balance assessment: Needs assistance Sitting-balance support: Bilateral upper extremity supported;Feet supported Sitting balance-Leahy Scale: Good       Standing balance-Leahy Scale: Fair Standing balance comment: reliant on external support for standing balance                             Cognition Arousal/Alertness: Awake/alert Behavior During Therapy: WFL for tasks assessed/performed Overall Cognitive Status: Within Functional Limits for tasks assessed                                        Exercises      General Comments        Pertinent Vitals/Pain Pain Assessment: Faces Pain Score: 0-No pain Faces Pain Scale: No hurt Pain Intervention(s): Limited activity within patient's tolerance;Monitored during session    Home Living                      Prior Function            PT Goals (current goals can now be found in the care plan section) Acute Rehab PT Goals Patient Stated Goal: To walk out of the hospital PT Goal Formulation: With patient Time For Goal Achievement:  05/14/17 Potential to Achieve Goals: Fair Progress towards PT goals: Progressing toward goals    Frequency    Min 3X/week      PT Plan Current plan remains appropriate    Co-evaluation PT/OT/SLP Co-Evaluation/Treatment: Yes Reason for Co-Treatment: Complexity of the patient's impairments (multi-system involvement);For patient/therapist safety;To address functional/ADL transfers PT goals addressed during session: Mobility/safety with mobility;Strengthening/ROM;Balance        AM-PAC PT "6 Clicks" Daily Activity  Outcome Measure  Difficulty turning over in bed (including adjusting bedclothes, sheets and blankets)?: Unable Difficulty moving from lying on back to sitting on the side of the bed? :  Unable Difficulty sitting down on and standing up from a chair with arms (e.g., wheelchair, bedside commode, etc,.)?: Unable Help needed moving to and from a bed to chair (including a wheelchair)?: A Lot Help needed walking in hospital room?: Total Help needed climbing 3-5 steps with a railing? : Total 6 Click Score: 7    End of Session Equipment Utilized During Treatment: Gait belt Activity Tolerance: Patient tolerated treatment well Patient left: in chair;with call bell/phone within reach Nurse Communication: Mobility status;Need for lift equipment PT Visit Diagnosis: Other abnormalities of gait and mobility (R26.89);Muscle weakness (generalized) (M62.81)     Time: 0923-3007 PT Time Calculation (min) (ACUTE ONLY): 25 min  Charges:  $Therapeutic Activity: 8-22 mins                    G Codes:       Deniece Ree PT, DPT, CBIS  Supplemental Physical Therapist [place]   Pager [phone]

## 2017-05-11 NOTE — Progress Notes (Signed)
PROGRESS NOTE    John Bailey  EHU:[id number] DOB: Sep 27, 1970 DOA: 04/22/2017 PCP: [name], MD   Chief Complaint  Patient presents with  . Generalized body swelling    Brief Narrative:  HPI on 04/22/2017 by Dr. Quintella Baton This is a 47 year old gentleman who presents with complaints of weight gain, increasing edema and difficulty walking as a result.  This is a patient with rheumatoid arthritis, diabetes mellitus, chronic fluid overload.  He was started on prednisone approximately 9 weeks ago to treat his rheumatoid arthritis.  At that time his Lasix was continued.  He was maintained on prednisone for 6 weeks.  His Lasix was restarted approximately 3 weeks ago.  With his Lasix being held he has gained weight, he states approximately 15 pounds.  Despite being restarted on his Lasix he is not lost any weight to the point now it has become difficult for him to walk.  He saw his PCP today and was sent him to the ER.  The patient denies any cough, wheeze or chest pains.  He does report increased shortness of breath with activity.  History provided by the patient.  The ER the patient is also hypoxic with ambulation, his oxygen sats going down to 80% with ambulation.  Interim history  Admitted for Acute CHF. Was placed on IV Lasix however this was discontinued as patient developed worsening renal failure.  Nephrology consulted and appreciated, patient started dialysis. Pending possible CIR vs SNF, CLIP  Assessment & Plan   Acute on chronic diastolic heart failure -Echocardiogram showed an EF of 55-60% with severe LVH, dilated left atrium and trivial pericardial effusion.  Unable to assess diastolic dysfunction. -Patient was started on IV Lasix however this was discontinued due to poor urine output and worsening creatinine  -Nephrology was consulted and appreciated, patient started dialysis and continues to have HD almost daily- had dialyzed today and next HD will be on 2/25 -Vascular  surgery consulted and appreciated, for permanent HD access placement- placed today 05/10/2017 -UNNA boots were removed  -weight down >90lbs since admission  Anasarca -Secondary to volume overload  -continue management with dialysis  Rheumatoid arthritis with flare -Patient receives Abatacept every Tuesday -knee x-rays negative for knee effusion -Continue methocarbamol, gabapentin -Dr. Alfredia Ferguson, previous hospitalist discussed with Dr. 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  -Dr. Donnetta Hutching, vascular surgery, consulted and appreciated, L AVF placed today -discussed with Dr. Marylou Flesher, nephrology, feels this is now ESRD, CLIP in process  -patient inquires about home dialysis  Hypertension -Blood pressure currently stable -Cardiology, Dr. Caryl Comes checking to rule out amyloid given the degree of LVH -Immunoelectrophoresis to check for amyloid however unable to collect 24-hour urine due to worsening renal failure  Hyperkalemia/hypokalemia -K 3.4 after repletion, will continue to replace -magnesium 2  -Continue to monitor BMP  Leukocytosis -Possibly secondary to RA flare versus steroids -Previous hospitalist discussed with infectious disease, Dr. Drucilla Schmidt who believes WBC to be related  to RA instead of  infection -WBC slightly down today 29.5  Diabetes mellitus, type II -Continue Lantus and insulin sliding scale with CBG monitoring -will add on 3u TID with meals  Hyperphosphatemia  -continue dialysis management and monitor  Diarrhea -C. difficile checked and was unremarkable, GI pathogen panel negative -Continue to monitor  Physical deconditioning -PT/OT consulted and PT recommended SNF, OT recommended CIR -CIR consulted- potential candidate. Discussed with CIR, pending further PT and insurance approval.  -Social work consulted  DVT Prophylaxis heparin  Code Status: Full  Family Communication: none at bedside  Disposition Plan: Admitted. Pending CLIP process  Consultants Cardiology/heart failure team Nephrology  Interventional radiology Infectious disease via phone Rheumatology via phone Vascular surgery Inpatient rehab  Procedures  Echocardiogram Right upper extremity vein mapping RIJ TDC, RIJ vein cannulation under [place], removal of RIJ vein temp dialysis catheter Left radiocephalic AV fistula  Antibiotics   Anti-infectives (From admission, onward)   Start     Dose/Rate Route Frequency Ordered Stop   05/10/17 0730  ceFAZolin (ANCEF) 3 g in dextrose 5 % 50 mL IVPB     3 g 160 mL/hr over 30 Minutes Intravenous To ShortStay Surgical 05/10/17 0721 05/10/17 0757   05/04/17 1215  ceFAZolin (ANCEF) 3 g in dextrose 5 % 50 mL IVPB    Comments:  Send with pt to OR   3 g 130 mL/hr over 30 Minutes Intravenous To Surgery 05/04/17 1202 05/04/17 1450   05/04/17 0600  ceFAZolin (ANCEF) IVPB 1 g/50 mL premix  Status:  Discontinued    Comments:  Send with pt to OR   1 g 100 mL/hr over 30 Minutes Intravenous To Surgery 05/03/17 1606 05/04/17 1202      Subjective:   John Bailey seen and examined today in hemodialysis.  No complaints today.  Denies chest pain, shortness of breath, abdominal pain, nausea or vomiting, diarrhea or constipation.  Continues to complain of  weakness in his hands and arms.  Objective:   Vitals:   05/11/17 0930 05/11/17 1000 05/11/17 1030 05/11/17 1100  BP: (!) 154/99 (!) 144/91 128/79 136/89  Pulse: 69 70 73 73  Resp:      Temp:      TempSrc:      SpO2:      Weight:      Height:        Intake/Output Summary (Last 24 hours) at 05/11/2017 1154 Last data filed at 05/11/2017 0600 Gross per 24 hour  Intake 894 ml  Output 100 ml  Net 794 ml   Filed Weights   05/10/17 0436 05/11/17 0417 05/11/17 0645  Weight: 133.1 kg (293 lb 6.9 oz) 133.9 kg (295 lb 3.1 oz) 135.6 kg (298 lb 15.1 oz)   Exam  General: Well developed, well nourished, NAD, appears stated age  42: NCAT, mucous membranes moist.   Neck: Supple  Cardiovascular: S1 S2 auscultated, RRR, no murmurs  Respiratory: Clear to auscultation bilaterally with equal chest rise  Abdomen: Soft, nontender, nondistended, + bowel sounds  Extremities: warm dry without cyanosis clubbing. LE edema improving   Neuro: AAOx3, nonfocal. Upper ext weakness  Psych: Normal affect and demeanor with intact judgement and insight  Data Reviewed: I have personally reviewed following labs and imaging studies  CBC: Recent Labs  Lab 05/05/17 0430  05/07/17 0516 05/08/17 0337 05/09/17 0531 05/10/17 0943 05/11/17 0702  WBC 26.8*   < > 27.1* 35.0* 34.9* 33.5* 29.5*  NEUTROABS 23.0*  --   --   --  29.7*  --   --   HGB 9.8*   < > 9.8* 9.8* 10.2* 9.7* 9.8*  HCT 29.2*   < > 29.4* 29.9* 30.5* 30.2* 29.4*  MCV 83.4   < > 84.5 83.8 84.3 83.7 83.3  PLT 530*   < > 484* 463* 540* 540* 516*   < > = values in this interval not displayed.   Basic Metabolic Panel: Recent Labs  Lab 05/05/17 0430 05/05/17 1701  05/07/17 0516  05/08/17 0337 05/08/17 1553 05/09/17 0531 05/10/17 0943 05/11/17 0702  NA 133* 134*   < > 135  --  132*  --  135 134* 136  K 3.1* 3.4*   < > 2.6*   < > 2.4* 2.8* 2.8* 3.4* 3.4*  CL 97* 98*   < > 99*  --  95*  --  99* 100* 102  CO2 21* 22   < > 22  --   23  --  23 21* 21*  GLUCOSE 138* 154*   < > 127*  --  231*  --  143* 208* 149*  BUN 50* 31*   < > 25*  --  21*  --  22* 41* 55*  CREATININE 5.42* 4.09*   < > 4.33*  --  4.03*  --  4.00* 5.32* 6.41*  CALCIUM 8.3* 8.2*   < > 8.3*  --  8.3*  --  8.6* 8.7* 8.7*  MG 2.2  --   --   --   --   --   --  2.0  --   --   PHOS 6.1* 3.6  --   --   --   --   --   --   --  5.1*   < > = values in this interval not displayed.   GFR: Estimated Creatinine Clearance: 21 mL/min (A) (by C-G formula based on SCr of 6.41 mg/dL (H)). Liver Function Tests: Recent Labs  Lab 05/05/17 0430 05/05/17 1701 05/11/17 0702  AST 12*  --   --   ALT 7*  --   --   ALKPHOS 74  --   --   BILITOT 0.6  --   --   PROT 5.9*  --   --   ALBUMIN 2.4* 2.5* 2.4*   No results for input(s): LIPASE, AMYLASE in the last 168 hours. No results for input(s): AMMONIA in the last 168 hours. Coagulation Profile: No results for input(s): INR, PROTIME in the last 168 hours. Cardiac Enzymes: No results for input(s): CKTOTAL, CKMB, CKMBINDEX, TROPONINI in the last 168 hours. BNP (last 3 results) No results for input(s): PROBNP in the last 8760 hours. HbA1C: No results for input(s): HGBA1C in the last 72 hours. CBG: Recent Labs  Lab 05/10/17 0550 05/10/17 0859 05/10/17 1148 05/10/17 1609 05/10/17 2004  GLUCAP 171* 204* 190* 216* 223*   Lipid Profile: No results for input(s): CHOL, HDL, LDLCALC, TRIG, CHOLHDL, LDLDIRECT in the last 72 hours. Thyroid Function Tests: No results for input(s): TSH, T4TOTAL, FREET4, T3FREE, THYROIDAB in the last 72 hours. Anemia Panel: Recent Labs    05/11/17 0725  TIBC 279  IRON 44*   Urine analysis:    Component Value Date/Time   COLORURINE YELLOW 04/25/2017 2057   APPEARANCEUR CLEAR 04/25/2017 2057   LABSPEC 1.010 04/25/2017 2057   PHURINE 5.0 04/25/2017 2057   GLUCOSEU NEGATIVE 04/25/2017 2057   HGBUR NEGATIVE 04/25/2017 2057   BILIRUBINUR NEGATIVE 04/25/2017 2057   KETONESUR NEGATIVE  04/25/2017 2057  PROTEINUR NEGATIVE 04/25/2017 2057   NITRITE NEGATIVE 04/25/2017 2057   LEUKOCYTESUR NEGATIVE 04/25/2017 2057   Sepsis Labs: @LABRCNTIP (procalcitonin:4,lacticidven:4)  ) Recent Results (from the past 240 hour(s))  Gastrointestinal Panel by PCR , Stool     Status: None   Collection Time: 05/02/17  2:42 PM  Result Value Ref Range Status   Campylobacter species NOT DETECTED NOT DETECTED Final   Plesimonas shigelloides NOT DETECTED NOT DETECTED Final   Salmonella species NOT DETECTED NOT DETECTED Final   Yersinia enterocolitica NOT DETECTED NOT DETECTED Final   Vibrio species NOT DETECTED NOT DETECTED Final   Vibrio cholerae NOT DETECTED NOT DETECTED Final   Enteroaggregative E coli (EAEC) NOT DETECTED NOT DETECTED Final   Enteropathogenic E coli (EPEC) NOT DETECTED NOT DETECTED Final   Enterotoxigenic E coli (ETEC) NOT DETECTED NOT DETECTED Final   Shiga like toxin producing E coli (STEC) NOT DETECTED NOT DETECTED Final   Shigella/Enteroinvasive E coli (EIEC) NOT DETECTED NOT DETECTED Final   Cryptosporidium NOT DETECTED NOT DETECTED Final   Cyclospora cayetanensis NOT DETECTED NOT DETECTED Final   Entamoeba histolytica NOT DETECTED NOT DETECTED Final   Giardia lamblia NOT DETECTED NOT DETECTED Final   Adenovirus F40/41 NOT DETECTED NOT DETECTED Final   Astrovirus NOT DETECTED NOT DETECTED Final   Norovirus GI/GII NOT DETECTED NOT DETECTED Final   Rotavirus A NOT DETECTED NOT DETECTED Final   Sapovirus (I, II, IV, and V) NOT DETECTED NOT DETECTED Final    Comment: Performed at [facility], [address] [place], [place] [address]  C difficile quick scan w PCR reflex     Status: None   Collection Time: 05/02/17  2:42 PM  Result Value Ref Range Status   C Diff antigen NEGATIVE NEGATIVE Final   C Diff toxin NEGATIVE NEGATIVE Final   C Diff interpretation No C. difficile detected.  Final    Comment: Performed at [facility], [place] [address],  [place], [place] [address]  Surgical pcr screen     Status: None   Collection Time: 05/04/17  6:53 AM  Result Value Ref Range Status   MRSA, PCR NEGATIVE NEGATIVE Final   Staphylococcus aureus NEGATIVE NEGATIVE Final    Comment: (NOTE) The Xpert SA Assay (FDA approved for NASAL specimens in patients 61 years of age and older), is one component of a comprehensive surveillance program. It is not intended to diagnose infection nor to guide or monitor treatment. Performed at [facility], [place] [address] [place], [place] [address]       Radiology Studies: Dg Chest Port 1 View  Result Date: 05/04/2017 CLINICAL DATA:  Dialysis catheter placement. EXAM: PORTABLE CHEST 1 VIEW COMPARISON:  04/29/2017. FINDINGS: Right IJ dual-lumen catheter noted with tip at the cavoatrial junction. Cardiomegaly with mild pulmonary vascular prominence. No focal infiltrate. No pleural effusion or pneumothorax. IMPRESSION: 1. Right IJ dual-lumen catheter noted with tip at the cavoatrial junction. 2.  Cardiomegaly with mild pulmonary venous congestion. Electronically Signed   By: Marcello Moores  Register   On: 05/04/2017 16:23   Dg Fluoro Guide Cv Line-no Report  Result Date: 05/04/2017 Fluoroscopy was utilized by the requesting physician.  No radiographic interpretation.   Vas [place] Upper Extremity Arterial Duplex  Result Date: 05/03/2017 UPPER EXTREMITY DUPLEX STUDY Indications: Pre-op for AVF. Examination Guidelines: A complete evaluation includes B-mode imaging, spectral doppler, color doppler, and power doppler as needed of all accessible portions of each vessel. Bilateral testing is considered an integral part  of a complete examination. Limited examinations for reoccurring indications may be performed as noted.  Right Pre-Dialysis Findings: +-----------------+----------+------------------+---------+--------------------+ Location         PSV (cm/s)Intralum. Diam.   Waveform Comments                                         (mm)                                            +-----------------+----------+------------------+---------+--------------------+ Brachial Antecub.50                          triphasicproximal upper arm   fossa                                                                      +-----------------+----------+------------------+---------+--------------------+ Radial Art at    51        2.60              biphasic high bifurcation in  Wrist                                                 the upper arm        +-----------------+----------+------------------+---------+--------------------+ Ulnar Art at     44        3.00              biphasic high bifurcation in  Wrist                                                 the upper arm        +-----------------+----------+------------------+---------+--------------------+ Left Pre-Dialysis Findings: +-----------------------+----------+--------------------+---------+--------+ Location               PSV (cm/s)Intralum. Diam. (mm)Waveform Comments +-----------------------+----------+--------------------+---------+--------+ Brachial Antecub. fossa47        6.20                triphasic         +-----------------------+----------+--------------------+---------+--------+ Radial Art at Wrist    67        3.20                                  +-----------------------+----------+--------------------+---------+--------+ Ulnar Art at Wrist     73        4.70                triphasic         +-----------------------+----------+--------------------+---------+--------+ Final Interpretation:  Right: No obstruction visualized in the right upper extremity. Left: No obstruction visualized in the left upper extremity. *See table(s) above for measurements and observations. Electronically signed by Curt Jews on 05/03/2017 at 7:11:24  PM.   Vas [place] Upper Ext Vein Mapping (pre-op Avf)  Result Date: 05/03/2017 UPPER EXTREMITY VEIN  MAPPING History: Pre-access. Examination Guidelines: A complete evaluation includes B-mode imaging, spectral doppler, color doppler, and power doppler as needed of all accessible portions of each vessel. Bilateral testing is considered an integral part of a complete examination. Limited examinations for reoccurring indications may be performed as noted. +-----------------+-------------+----------+---------+ Right Cephalic   Diameter (mm)Depth (mm)Findings  +-----------------+-------------+----------+---------+ Shoulder             3.80        6.30             +-----------------+-------------+----------+---------+ Prox upper arm       3.10       11.80             +-----------------+-------------+----------+---------+ Mid upper arm                           bandages  +-----------------+-------------+----------+---------+ Dist upper arm       2.40        4.60             +-----------------+-------------+----------+---------+ Antecubital fossa    1.70        9.40             +-----------------+-------------+----------+---------+ Prox forearm         3.80        9.60             +-----------------+-------------+----------+---------+ Mid forearm          2.90        9.40   branching +-----------------+-------------+----------+---------+ Dist forearm         3.20        2.80             +-----------------+-------------+----------+---------+ Wrist                1.90        2.70             +-----------------+-------------+----------+---------+ +-----------------+-------------+----------+--------------+ Right Basilic    Diameter (mm)Depth (mm)   Findings    +-----------------+-------------+----------+--------------+ Shoulder                                not visualized +-----------------+-------------+----------+--------------+ Prox upper arm                          not visualized +-----------------+-------------+----------+--------------+ Mid upper arm                            not visualized +-----------------+-------------+----------+--------------+ Dist upper arm                          not visualized +-----------------+-------------+----------+--------------+ Antecubital fossa                       not visualized +-----------------+-------------+----------+--------------+ Prox forearm         2.10                              +-----------------+-------------+----------+--------------+ Mid forearm          1.50                              +-----------------+-------------+----------+--------------+  Distal forearm       1.60                              +-----------------+-------------+----------+--------------+ Wrist                1.40                              +-----------------+-------------+----------+--------------+ +-----------------+-------------+----------+---------+ Left Cephalic    Diameter (mm)Depth (mm)Findings  +-----------------+-------------+----------+---------+ Shoulder             3.10       20.00             +-----------------+-------------+----------+---------+ Prox upper arm       3.60       13.50             +-----------------+-------------+----------+---------+ Mid upper arm        2.90       10.00             +-----------------+-------------+----------+---------+ Dist upper arm       2.70       10.00             +-----------------+-------------+----------+---------+ Antecubital fossa    3.70        3.00             +-----------------+-------------+----------+---------+ Prox forearm         2.80        8.50   branching +-----------------+-------------+----------+---------+ Mid forearm          2.90        4.90             +-----------------+-------------+----------+---------+ Dist forearm         2.80        4.20             +-----------------+-------------+----------+---------+ Wrist                3.50        4.10              +-----------------+-------------+----------+---------+ +-----------------+-------------+----------+--------------+ Left Basilic     Diameter (mm)Depth (mm)   Findings    +-----------------+-------------+----------+--------------+ Shoulder                                not visualized +-----------------+-------------+----------+--------------+ Prox upper arm                          not visualized +-----------------+-------------+----------+--------------+ Mid upper arm                           not visualized +-----------------+-------------+----------+--------------+ Dist upper arm                          not visualized +-----------------+-------------+----------+--------------+ Antecubital fossa    3.40                              +-----------------+-------------+----------+--------------+ Prox forearm         4.00                              +-----------------+-------------+----------+--------------+ Mid  forearm          1.40                              +-----------------+-------------+----------+--------------+ Distal forearm                          not visualized +-----------------+-------------+----------+--------------+ Elbow                                   not visualized +-----------------+-------------+----------+--------------+ Wrist                                   not visualized +-----------------+-------------+----------+--------------+ Final Interpretation: *See table(s) above for measurements and observations.  Curt Jews Electronically signed by Curt Jews on 05/03/2017 at 7:10:36 PM.      Scheduled Meds: . amiodarone  200 mg Oral BID  . apixaban  5 mg Oral BID  . carvedilol  18.75 mg Oral BID WC  . darbepoetin (ARANESP) injection - DIALYSIS  100 mcg Intravenous Q Tue-HD  . fluticasone  2 spray Each Nare Daily  . gabapentin  300 mg Oral Daily  . insulin aspart  0-15 Units Subcutaneous TID WC  . insulin aspart  0-5 Units  Subcutaneous QHS  . insulin glargine  25 Units Subcutaneous QHS  . multivitamin  1 tablet Oral QHS  . predniSONE  20 mg Oral Q breakfast  . sodium chloride flush  3 mL Intravenous Q12H  . sodium chloride flush  3 mL Intravenous Q12H   Continuous Infusions: . sodium chloride    . sodium chloride    . sodium chloride 10 mL/hr at 05/04/17 1220     LOS: 19 days   Time Spent in minutes   30 minutes  Ollivander See D.O. on 05/11/2017 at 11:54 AM  Between 7am to 7pm - Pager - [phone]  After 7pm go to [email or web address] - password [email or web address]  And look for the night coverage person covering for me after hours  [facility] Office  [phone]

## 2017-05-11 NOTE — Progress Notes (Signed)
Subjective: Interval History: has complaints weak in hands.  Objective: Vital signs in last 24 hours: Temp:  [97.5 F (36.4 C)-98.7 F (37.1 C)] 97.7 F (36.5 C) (02/26 0645) Pulse Rate:  [62-73] 65 (02/26 0830) Resp:  [14-22] 21 (02/26 0645) BP: (113-147)/(76-97) 144/91 (02/26 0830) SpO2:  [94 %-99 %] 98 % (02/26 0645) Weight:  [133.9 kg (295 lb 3.1 oz)-135.6 kg (298 lb 15.1 oz)] 135.6 kg (298 lb 15.1 oz) (02/26 0645) Weight change: 0.8 kg (1 lb 12.2 oz)  Intake/Output from previous day: 02/25 0701 - 02/26 0700 In: 1094 [P.O.:660; I.V.:434] Out: 105 [Urine:100; Blood:5] Intake/Output this shift: No intake/output data recorded.  General appearance: alert, cooperative, no distress and morbidly obese Resp: diminished breath sounds bilaterally Chest wall: RIJ cath Cardio: S1, S2 normal and systolic murmur: systolic ejection 2/6, decrescendo at 2nd left intercostal space GI: obese, pos bs, liver down 6 cm Extremities: edema 2+ and AVF LLA  Lab Results: Recent Labs    05/10/17 0943 05/11/17 0702  WBC 33.5* 29.5*  HGB 9.7* 9.8*  HCT 30.2* 29.4*  PLT 540* 516*   BMET:  Recent Labs    05/10/17 0943 05/11/17 0702  NA 134* 136  K 3.4* 3.4*  CL 100* 102  CO2 21* 21*  GLUCOSE 208* 149*  BUN 41* 55*  CREATININE 5.32* 6.41*  CALCIUM 8.7* 8.7*   No results for input(s): PTH in the last 72 hours. Iron Studies: No results for input(s): IRON, TIBC, TRANSFERRIN, FERRITIN in the last 72 hours.  Studies/Results: No results found.  I have reviewed the patient's current medications.  Assessment/Plan: 1 ESRD HD today and ^ time.  Still vol xs.   2 HTN lower vol, lower meds 3 Anemia esa started, check Fe 4 HPTH check 5 Obesity 6 DM 7 [place] rehab 8 Afib P HD, esa, check labs, Rehab, CLIP    LOS: 19 days   John Bailey 05/11/2017,9:00 AM

## 2017-05-11 NOTE — Progress Notes (Signed)
Inpatient Diabetes Program Recommendations  AACE/ADA: New Consensus Statement on Inpatient Glycemic Control (2015)  Target Ranges:  Prepandial:   less than 140 mg/dL      Peak postprandial:   less than 180 mg/dL (1-2 hours)      Critically ill patients:  140 - 180 mg/dL   Lab Results  Component Value Date   GLUCAP 223 (H) 05/10/2017   HGBA1C 9.2 (H) 03/06/2017    Review of Glycemic ControlResults for TYMERE, DEPUY" (MRN [id number]) as of 05/11/2017 10:51  Ref. Range 05/10/2017 08:59 05/10/2017 11:48 05/10/2017 16:09 05/10/2017 20:04  Glucose-Capillary Latest Ref Range: 65 - 99 mg/dL 204 (H) 190 (H) 216 (H) 223 (H)    Diabetes history: Type 2 DM Outpatient Diabetes medications:  Novolog 0-12 units tid with meals, Lantus 25 units q HS Current orders for Inpatient glycemic control:  Novolog moderate tid with meals and HS, Lantus 25 units q HS, Prednisone 20 mg q AM  Inpatient Diabetes Program Recommendations:    If appropriate, consider adding Novolog meal coverage 3 units tid with meals (hold if patient eats less than 50%).   Thanks,  Adah Perl, RN, BC-ADM Inpatient Diabetes Coordinator Pager [phone] (8a-5p)

## 2017-05-11 NOTE — Progress Notes (Signed)
  Postoperative hemodialysis access     Date of Surgery:  05/10/17 Surgeon: Early  Subjective:  Pt seen on HD-resting comfortably.  Pt states he has some tingling in pain in his left hand, however, he is very clear that this was present prior to surgery and is not any different.   PHYSICAL EXAMINATION:  Vitals:   05/11/17 0830 05/11/17 0900  BP: (!) 144/91 (!) 143/89  Pulse: 65 65  Resp:    Temp:    SpO2:      Incision is clean and dry Sensation in digits is intact;  There is  Thrill  There is bruit. The graft/fistula is palpable    ASSESSMENT/PLAN:  CHASTEN BLAZE is a 47 y.o. year old male who is s/p left radial cephalic AVF 6/96/29.  -graft/fistula is patent -pt does not have evidence of steal sx-he does have tingling and a little pain in his left hand but this was present prior to surgery and pt is very clear that this was present prior to surgery and is no different. -f/u with Dr. Donnetta Hutching in 4-6 weeks to check maturation of AVF.  Discussed with pt that he may need superficialization, but will wait and see what repeat duplex reveals. -will sign off-call as needed.   [name], PA-C [facility] [phone]

## 2017-05-11 NOTE — Progress Notes (Addendum)
Occupational Therapy Treatment Patient Details Name: John Bailey MRN: [id number] DOB: 12/08/1970 Today's Date: 05/11/2017    History of present illness John Bailey is a 47 y.o. rheumatoid arthritis, diabetes mellitus, heart failure, atrial fibrillation. Patient presented with dyspnea and found to have a CHF exacerbation   OT comments  Pt demonstrating good progress toward OT goals today. He was able to complete stand-pivot simulated toilet transfer with mod assist +2 today. Pt received eating lunch with utensils with built-up handles this session and coordinating movements well. He was very motivated to participate and was able to slide his feet into B shoes with set-up today. Facilitated improved strength in B UE in preparation for ADL participation and he is demonstrating significantly improved motor control. Pt is demonstrating significantly improved activity tolerance for ADL participation and feel he will be able to tolerate intensity of CIR level therapies. Will continue to follow while admitted.    Follow Up Recommendations  CIR;Supervision/Assistance - 24 hour    Equipment Recommendations  Other (comment)(defer to next venue of care)    Recommendations for Other Services Rehab consult    Precautions / Restrictions Precautions Precautions: Fall Restrictions Weight Bearing Restrictions: No       Mobility Bed Mobility Overal bed mobility: Needs Assistance Bed Mobility: Supine to Sit Rolling: Min guard   Supine to sit: Min guard     General bed mobility comments: Min guard assist to get to EOB.   Transfers Overall transfer level: Needs assistance Equipment used: 2 person hand held assist Transfers: Sit to/from [facility] Sit to Stand: Mod assist;+2 physical assistance;+2 safety/equipment Stand pivot transfers: Mod assist;+2 physical assistance;+2 safety/equipment       General transfer comment: Mod assist to power up into standing and pt  rocking for momentum prior to rise to standing.     Balance Overall balance assessment: Needs assistance Sitting-balance support: Bilateral upper extremity supported;Feet supported Sitting balance-Leahy Scale: Good     Standing balance support: Bilateral upper extremity supported;During functional activity Standing balance-Leahy Scale: Poor Standing balance comment: reliant on external support for standing balance                            ADL either performed or assessed with clinical judgement   ADL Overall ADL's : Needs assistance/impaired Eating/Feeding: Set up;With adaptive utensils;Sitting Eating/Feeding Details (indicate cue type and reason): Pt able to utilize built-up handle for self-feeding with supervision this session.                      Toilet Transfer: Moderate assistance;+2 for physical assistance;Stand-pivot Toilet Transfer Details (indicate cue type and reason): Simulated from bed to recliner. Mod assist +2 to power up and extended time to control movements.          Functional mobility during ADLs: Moderate assistance;+2 for physical assistance(stand-pivot only) General ADL Comments: Pt motivated for OOB transfer this session.      Vision   Vision Assessment?: No apparent visual deficits   Perception     Praxis      Cognition Arousal/Alertness: Awake/alert Behavior During Therapy: WFL for tasks assessed/performed Overall Cognitive Status: Within Functional Limits for tasks assessed                                 General Comments: at times slow to respond but appears [facility]  Exercises Exercises: Other exercises Other Exercises Other Exercises: PROM B digits. Limited to approximately 10% of composite fist on the L and 25% of composite fist on the R passively. Pain in all joints.  Other Exercises: AROM B digits (minimal ability on L but able to complete 50% composite fist on the R actively.  Other Exercises:  Shoulder press x10 bilaterally Other Exercises: cross body punches bilaterally x10   Shoulder Instructions       General Comments VSS throughout session. Notified RN of progress and pt up in recliner.     Pertinent Vitals/ Pain       Pain Assessment: Faces Pain Score: 0-No pain Faces Pain Scale: Hurts a little bit Pain Location: B wrist/digits with PROM; neck when seated in recliner Pain Descriptors / Indicators: Discomfort;Constant;Grimacing Pain Intervention(s): Monitored during session;Repositioned  Home Living                                          Prior Functioning/Environment              Frequency  Min 2X/week        Progress Toward Goals  OT Goals(current goals can now be found in the care plan section)  Progress towards OT goals: Progressing toward goals  Acute Rehab OT Goals Patient Stated Goal: To walk out of the hospital OT Goal Formulation: With patient Time For Goal Achievement: 05/19/17 Potential to Achieve Goals: Good  Plan Discharge plan remains appropriate    Co-evaluation    PT/OT/SLP Co-Evaluation/Treatment: Yes Reason for Co-Treatment: Complexity of the patient's impairments (multi-system involvement);For patient/therapist safety;To address functional/ADL transfers PT goals addressed during session: Mobility/safety with mobility;Strengthening/ROM;Balance OT goals addressed during session: ADL's and self-care;Strengthening/ROM      AM-PAC PT "6 Clicks" Daily Activity     Outcome Measure   Help from another person eating meals?: A Little Help from another person taking care of personal grooming?: A Lot Help from another person toileting, which includes using toliet, bedpan, or urinal?: A Lot Help from another person bathing (including washing, rinsing, drying)?: A Lot Help from another person to put on and taking off regular upper body clothing?: A Lot Help from another person to put on and taking off regular lower  body clothing?: A Lot 6 Click Score: 13    End of Session Equipment Utilized During Treatment: Gait belt  OT Visit Diagnosis: Other abnormalities of gait and mobility (R26.89);Muscle weakness (generalized) (M62.81);Pain Pain - Right/Left: Left(bilateral) Pain - part of body: (wrists/hands; neck)   Activity Tolerance Patient tolerated treatment well   Patient Left in bed;with call bell/phone within reach   Nurse Communication Mobility status        Time: 1610-9604 OT Time Calculation (min): 25 min  Charges: OT General Charges $OT Visit: 1 Visit OT Treatments $Self Care/Home Management : 8-22 mins  Norman Herrlich, MS OTR/L  Pager: Gwinn A Pranav Lince 05/11/2017, 5:10 PM

## 2017-05-11 NOTE — Progress Notes (Signed)
Inpatient Rehabilitation  I await updated therapy notes.  Hopeful for session notes that address standing and transferring in order to demonstrate therapy tolerance in order to initiate insurance authorization with [facility].  Call if questions.   Carmelia Roller., CCC/SLP Admission Coordinator  [place]  Cell [phone]

## 2017-05-12 LAB — RENAL FUNCTION PANEL
ALBUMIN: 2.6 g/dL — AB (ref 3.5–5.0)
ANION GAP: 12 (ref 5–15)
ANION GAP: 15 (ref 5–15)
Albumin: 2.5 g/dL — ABNORMAL LOW (ref 3.5–5.0)
BUN: 38 mg/dL — ABNORMAL HIGH (ref 6–20)
BUN: 45 mg/dL — ABNORMAL HIGH (ref 6–20)
CALCIUM: 8.8 mg/dL — AB (ref 8.9–10.3)
CO2: 24 mmol/L (ref 22–32)
CO2: 20 mmol/L — ABNORMAL LOW (ref 22–32)
CREATININE: 4.8 mg/dL — AB (ref 0.61–1.24)
Calcium: 8.6 mg/dL — ABNORMAL LOW (ref 8.9–10.3)
Chloride: 100 mmol/L — ABNORMAL LOW (ref 101–111)
Chloride: 98 mmol/L — ABNORMAL LOW (ref 101–111)
Creatinine, Ser: 5.22 mg/dL — ABNORMAL HIGH (ref 0.61–1.24)
GFR calc Af Amer: 16 mL/min — ABNORMAL LOW (ref >60)
GFR calc Af Amer: 14 mL/min — ABNORMAL LOW (ref >60)
GFR calc non Af Amer: 13 mL/min — ABNORMAL LOW (ref >60)
GFR calc non Af Amer: 12 mL/min — ABNORMAL LOW (ref >60)
GLUCOSE: 103 mg/dL — AB (ref 65–99)
GLUCOSE: 164 mg/dL — AB (ref 65–99)
PHOSPHORUS: 4.6 mg/dL (ref 2.5–4.6)
POTASSIUM: 4.1 mmol/L (ref 3.5–5.1)
Phosphorus: 4 mg/dL (ref 2.5–4.6)
Potassium: 3.6 mmol/L (ref 3.5–5.1)
SODIUM: 136 mmol/L (ref 135–145)
Sodium: 133 mmol/L — ABNORMAL LOW (ref 135–145)

## 2017-05-12 LAB — CBC
HCT: 31.7 % — ABNORMAL LOW (ref 39.0–52.0)
Hemoglobin: 10.4 g/dL — ABNORMAL LOW (ref 13.0–17.0)
MCH: 27.8 pg (ref 26.0–34.0)
MCHC: 32.8 g/dL (ref 30.0–36.0)
MCV: 84.8 fL (ref 78.0–100.0)
PLATELETS: 499 10*3/uL — AB (ref 150–400)
RBC: 3.74 MIL/uL — ABNORMAL LOW (ref 4.22–5.81)
RDW: 19.3 % — AB (ref 11.5–15.5)
WBC: 30.1 10*3/uL — ABNORMAL HIGH (ref 4.0–10.5)

## 2017-05-12 LAB — PARATHYROID HORMONE, INTACT (NO CA): PTH: 230 pg/mL — AB (ref 15–65)

## 2017-05-12 LAB — GLUCOSE, CAPILLARY
GLUCOSE-CAPILLARY: 180 mg/dL — AB (ref 65–99)
Glucose-Capillary: 100 mg/dL — ABNORMAL HIGH (ref 65–99)
Glucose-Capillary: 203 mg/dL — ABNORMAL HIGH (ref 65–99)
Glucose-Capillary: 256 mg/dL — ABNORMAL HIGH (ref 65–99)

## 2017-05-12 MED ORDER — PREDNISONE 10 MG PO TABS
10.0000 mg | ORAL_TABLET | Freq: Every day | ORAL | Status: DC
  Administered 2017-05-13: 10 mg via ORAL
  Filled 2017-05-12: qty 1

## 2017-05-12 NOTE — Progress Notes (Signed)
Accepted at [facility]  Schedule  :Tuesday, Thursday, Saturday  Chair Time :12:00pm

## 2017-05-12 NOTE — Progress Notes (Signed)
Occupational Therapy Treatment Patient Details Name: John Bailey MRN: [id number] DOB: 09/05/1970 Today's Date: 05/12/2017    History of present illness John Bailey is a 47 y.o. rheumatoid arthritis, diabetes mellitus, heart failure, atrial fibrillation. Patient presented with dyspnea and found to have a CHF exacerbation. Since admission has had cardioversion secondary to a fib with RVR, tunneled HD catheter on 2/20, and L AV fistula placement on 05/10/17.    OT comments  Pt demonstrating improving tolerance for ADL participation. He reports being able to stay in chair after yesterday's session from approximately 2pm until 9:30pm. He was motivated to utilize RW today with functional transfers and was able to complete a few steps pivoting to chair with RW. Limited by difficulty grasping L handle of RW and plan to utilize walker splint next visit. Facilitated improved B grasp strength with therapy putty manipulation today. Pt continues to be a good candidate for CIR level rehabilitation and is demonstrating improved activity tolerance and motivation to participate in therapies.     Follow Up Recommendations  CIR;Supervision/Assistance - 24 hour    Equipment Recommendations  Other (comment)(defer to next venue of care)    Recommendations for Other Services Rehab consult    Precautions / Restrictions Precautions Precautions: Fall Restrictions Weight Bearing Restrictions: No       Mobility Bed Mobility Overal bed mobility: Needs Assistance Bed Mobility: Supine to Sit     Supine to sit: Min guard     General bed mobility comments: Min guard for safety. Verbal cues for hand placement on rails. Required increased time.   Transfers Overall transfer level: Needs assistance Equipment used: Rolling walker (2 wheeled) Transfers: Sit to/from [facility] Sit to Stand: Mod assist;+2 physical assistance;+2 safety/equipment Stand pivot transfers: Mod assist;+2 physical  assistance;+2 safety/equipment       General transfer comment: Heavy mod A +2 to power into standing. Educated about appropriate use of RW and required cues for upright posture. Slight L knee buckling noted during transfer and required heavy mod A +2 using RW to transfer to chair.     Balance Overall balance assessment: Needs assistance Sitting-balance support: Feet supported;No upper extremity supported Sitting balance-Leahy Scale: Good     Standing balance support: Bilateral upper extremity supported;During functional activity Standing balance-Leahy Scale: Poor Standing balance comment: Reliant on BUE support and external support.                            ADL either performed or assessed with clinical judgement   ADL Overall ADL's : Needs assistance/impaired Eating/Feeding: Set up;With adaptive utensils;Sitting                       Toilet Transfer: Moderate assistance;+2 for physical assistance;Stand-pivot Toilet Transfer Details (indicate cue type and reason): Heavy mod today as attempted with RW. Poor ability to maintain grasp on RW with L hand and plan to attempt with walker splint next session.          Functional mobility during ADLs: Moderate assistance;+2 for physical assistance General ADL Comments: Pt concerned over B hand weakness as unable to grasp RW. Applied walker splint to L side of RW and plan to address next session.     Vision   Vision Assessment?: No apparent visual deficits   Perception     Praxis      Cognition Arousal/Alertness: Awake/alert Behavior During Therapy: WFL for tasks assessed/performed Overall Cognitive Status:  Within Functional Limits for tasks assessed                                 General Comments: at times slow to respond but appears [facility]        Exercises Exercises: Other exercises General Exercises - Lower Extremity Long Arc Quad: AROM;Right;AAROM;Left;10 reps;Seated Heel Slides:  PROM;Left;5 reps;Supine(for stretching L knee secondary to pain) Hip Flexion/Marching: AROM;Right;AAROM;Left;10 reps;Seated Toe Raises: AROM;Both;10 reps;Seated Heel Raises: AROM;Both;10 reps;Seated Other Exercises Other Exercises: Provided level 1 therapy putty to manipulate for B hand strengthening tasks.    Shoulder Instructions       General Comments VSS     Pertinent Vitals/ Pain       Pain Assessment: Faces Faces Pain Scale: Hurts a little bit Pain Location: L knee  Pain Descriptors / Indicators: Discomfort;Grimacing Pain Intervention(s): Limited activity within patient's tolerance;Monitored during session;Repositioned  Home Living                                          Prior Functioning/Environment              Frequency  Min 2X/week        Progress Toward Goals  OT Goals(current goals can now be found in the care plan section)  Progress towards OT goals: Progressing toward goals  Acute Rehab OT Goals Patient Stated Goal: To walk out of the hospital OT Goal Formulation: With patient Time For Goal Achievement: 05/19/17 Potential to Achieve Goals: Good ADL Goals Pt Will Perform Grooming: with set-up;sitting Pt Will Perform Lower Body Dressing: sit to/from stand;with mod assist Pt Will Transfer to Toilet: with min assist;bedside commode;stand pivot transfer Pt/caregiver will Perform Home Exercise Program: Increased ROM;Increased strength;With written HEP provided;Both right and left upper extremity;Independently  Plan Discharge plan remains appropriate    Co-evaluation    PT/OT/SLP Co-Evaluation/Treatment: Yes Reason for Co-Treatment: Complexity of the patient's impairments (multi-system involvement);For patient/therapist safety;To address functional/ADL transfers PT goals addressed during session: Mobility/safety with mobility;Balance;Strengthening/ROM OT goals addressed during session: ADL's and self-care;Strengthening/ROM       AM-PAC PT "6 Clicks" Daily Activity     Outcome Measure   Help from another person eating meals?: A Little Help from another person taking care of personal grooming?: A Lot Help from another person toileting, which includes using toliet, bedpan, or urinal?: A Lot Help from another person bathing (including washing, rinsing, drying)?: A Lot Help from another person to put on and taking off regular upper body clothing?: A Lot Help from another person to put on and taking off regular lower body clothing?: A Lot 6 Click Score: 13    End of Session Equipment Utilized During Treatment: Gait belt  OT Visit Diagnosis: Other abnormalities of gait and mobility (R26.89);Muscle weakness (generalized) (M62.81);Pain Pain - Right/Left: Left(bilateral ) Pain - part of body: Knee;Arm   Activity Tolerance Patient tolerated treatment well   Patient Left in chair;with call bell/phone within reach   Nurse Communication          Time: 1610-9604 OT Time Calculation (min): 28 min  Charges: OT General Charges $OT Visit: 1 Visit OT Treatments $Self Care/Home Management : 8-22 mins  Norman Herrlich, MS OTR/L  Pager: Artois A Ramonica Grigg 05/12/2017, 4:34 PM

## 2017-05-12 NOTE — Progress Notes (Addendum)
Physical Therapy Treatment Patient Details Name: John Bailey MRN: [id number] DOB: 19-Jul-1970 Today's Date: 05/12/2017    History of Present Illness John Bailey is a 47 y.o. rheumatoid arthritis, diabetes mellitus, heart failure, atrial fibrillation. Patient presented with dyspnea and found to have a CHF exacerbation. Since admission has had cardioversion secondary to a fib with RVR, tunneled HD catheter on 2/20, and L AV fistula placement on 05/10/17.     PT Comments    Pt progressing towards goals. Able to perform multiple side steps from bed to chair this session during stand pivot transfer with mod A +2. Also able to tolerated seated HEP. LLE remains weak and slightly painful, so required assist with LLE during HEP. Per pt report, able to tolerate sitting in chair from ~2p to around 9:30p.  Feel pt would extremely benefit from CIR therapy intensity prior to return home. Pt motivated to regain independence. Will continue to follow acutely to maximize functional mobility independence and safety.   Follow Up Recommendations  CIR;Supervision/Assistance - 24 hour     Equipment Recommendations  Other (comment)(TBD)    Recommendations for Other Services OT consult     Precautions / Restrictions Precautions Precautions: Fall Restrictions Weight Bearing Restrictions: No    Mobility  Bed Mobility Overal bed mobility: Needs Assistance Bed Mobility: Supine to Sit     Supine to sit: Min guard     General bed mobility comments: Min guard for safety. Verbal cues for hand placement on rails. Required increased time.   Transfers Overall transfer level: Needs assistance Equipment used: Rolling walker (2 wheeled) Transfers: Sit to/from [facility] Sit to Stand: Mod assist;+2 physical assistance;+2 safety/equipment Stand pivot transfers: Mod assist;+2 physical assistance;+2 safety/equipment       General transfer comment: Heavy mod A +2 to power into standing.  Educated about appropriate use of RW and required cues for upright posture. Slight L knee buckling noted during transfer and required heavy mod A +2 using RW to transfer to chair.   Ambulation/Gait             General Gait Details: Able to take side steps along EOB and over to chair during stand pivot transfer. Verbal and manual assist required for RW use.    Stairs            Wheelchair Mobility    Modified Rankin (Stroke Patients Only)       Balance Overall balance assessment: Needs assistance Sitting-balance support: Feet supported;No upper extremity supported Sitting balance-Leahy Scale: Good     Standing balance support: Bilateral upper extremity supported;During functional activity Standing balance-Leahy Scale: Poor Standing balance comment: Reliant on BUE support and external support.                             Cognition Arousal/Alertness: Awake/alert Behavior During Therapy: WFL for tasks assessed/performed Overall Cognitive Status: Within Functional Limits for tasks assessed                                        Exercises General Exercises - Lower Extremity Long Arc Quad: AROM;Right;AAROM;Left;10 reps;Seated Heel Slides: PROM;Left;5 reps;Supine(for stretching L knee secondary to pain) Hip Flexion/Marching: AROM;Right;AAROM;Left;10 reps;Seated Toe Raises: AROM;Both;10 reps;Seated Heel Raises: AROM;Both;10 reps;Seated Other Exercises Other Exercises: Passive L DF stretch in supine X 20 secs as pt reports LLE tight.  General Comments General comments (skin integrity, edema, etc.): VSS throughout. Per pt was able to tolerate sitting in chair from ~2p to around 9:30p.      Pertinent Vitals/Pain Pain Assessment: Faces Faces Pain Scale: Hurts a little bit Pain Location: L knee  Pain Descriptors / Indicators: Discomfort;Grimacing Pain Intervention(s): Limited activity within patient's tolerance;Monitored during  session;Repositioned    Home Living                      Prior Function            PT Goals (current goals can now be found in the care plan section) Acute Rehab PT Goals Patient Stated Goal: To walk out of the hospital PT Goal Formulation: With patient Time For Goal Achievement: 05/14/17 Potential to Achieve Goals: Fair Progress towards PT goals: Progressing toward goals    Frequency    Min 3X/week      PT Plan Current plan remains appropriate    Co-evaluation PT/OT/SLP Co-Evaluation/Treatment: Yes Reason for Co-Treatment: Complexity of the patient's impairments (multi-system involvement);For patient/therapist safety;To address functional/ADL transfers PT goals addressed during session: Mobility/safety with mobility;Balance;Strengthening/ROM        AM-PAC PT "6 Clicks" Daily Activity  Outcome Measure  Difficulty turning over in bed (including adjusting bedclothes, sheets and blankets)?: A Little Difficulty moving from lying on back to sitting on the side of the bed? : Unable Difficulty sitting down on and standing up from a chair with arms (e.g., wheelchair, bedside commode, etc,.)?: Unable Help needed moving to and from a bed to chair (including a wheelchair)?: A Lot Help needed walking in hospital room?: A Lot Help needed climbing 3-5 steps with a railing? : Total 6 Click Score: 10    End of Session Equipment Utilized During Treatment: Gait belt Activity Tolerance: Patient tolerated treatment well Patient left: in chair;with call bell/phone within reach;with family/visitor present Nurse Communication: Mobility status PT Visit Diagnosis: Other abnormalities of gait and mobility (R26.89);Muscle weakness (generalized) (M62.81)     Time: 3646-8032 PT Time Calculation (min) (ACUTE ONLY): 28 min  Charges:  $Therapeutic Activity: 8-22 mins                    G Codes:       Leighton Ruff, PT, DPT  Acute Rehabilitation Services  Pager:  [phone]    Rudean Hitt 05/12/2017, 2:14 PM

## 2017-05-12 NOTE — Progress Notes (Signed)
PROGRESS NOTE    John Bailey  GHW:[id number] DOB: May 11, 1970 DOA: 04/22/2017 PCP: [name], MD   Chief Complaint  Patient presents with  . Generalized body swelling    Brief Narrative:  HPI on 04/22/2017 by Dr. Quintella Baton This is a 47 year old gentleman who presents with complaints of weight gain, increasing edema and difficulty walking as a result.  This is a patient with rheumatoid arthritis, diabetes mellitus, chronic fluid overload.  He was started on prednisone approximately 9 weeks ago to treat his rheumatoid arthritis.  At that time his Lasix was continued.  He was maintained on prednisone for 6 weeks.  His Lasix was restarted approximately 3 weeks ago.  With his Lasix being held he has gained weight, he states approximately 15 pounds.  Despite being restarted on his Lasix he is not lost any weight to the point now it has become difficult for him to walk.  He saw his PCP today and was sent him to the ER.  The patient denies any cough, wheeze or chest pains.  He does report increased shortness of breath with activity.  History provided by the patient.  The ER the patient is also hypoxic with ambulation, his oxygen sats going down to 80% with ambulation.  Interim history  Admitted for Acute CHF. Was placed on IV Lasix however this was discontinued as patient developed worsening renal failure.  Nephrology consulted and appreciated, patient started dialysis. Pending possible CIR vs SNF, CLIP  Assessment & Plan   Acute on chronic diastolic heart failure -Echocardiogram showed an EF of 55-60% with severe LVH, dilated left atrium and trivial pericardial effusion.  Unable to assess diastolic dysfunction. -Patient was started on IV Lasix however this was discontinued due to poor urine output and worsening creatinine  -Nephrology was consulted and appreciated, patient started dialysis and continues to have HD almost daily- had dialyzed today and next HD will be on 2/25 -Vascular  surgery consulted and appreciated, for permanent HD access placement- placed today 05/10/2017 -UNNA boots were removed  -weight down about 100lbs since admission  Anasarca -Secondary to volume overload  -continue management with dialysis  Rheumatoid arthritis with flare -Patient receives Abatacept every Tuesday -knee x-rays negative for knee effusion -Continue methocarbamol, gabapentin -Dr. Alfredia Ferguson, previous hospitalist discussed with Dr. 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  -Dr. Donnetta Hutching, vascular surgery, consulted and appreciated, L AVF placed today -discussed with Dr. Marylou Flesher, nephrology, feels this is now ESRD, CLIP in process  -patient inquires about home dialysis  Hypertension -Blood pressure currently stable -Cardiology, Dr. Caryl Comes checking to rule out amyloid given the degree of LVH -Immunoelectrophoresis to check for amyloid however unable to collect 24-hour urine due to worsening renal failure  Hyperkalemia/hypokalemia -K 3.4 after repletion, will continue to replace -magnesium 2  -Continue to monitor BMP  Leukocytosis -Possibly secondary to RA flare versus steroids -Previous hospitalist discussed with infectious disease, Dr. Drucilla Schmidt who  believes WBC  to be related to RA instead of infection -WBC currently 30.1 today  Diabetes mellitus, type II -Continue Lantus, novolog 3u TID WC, and insulin sliding scale with CBG monitoring -blood sugars appear to be better controlled -A1c 9.2 (Dec 2018)  Hyperphosphatemia  -continue dialysis management and monitor  Diarrhea -C. difficile checked and was unremarkable, GI pathogen panel negative -Continue to monitor  Physical deconditioning -PT/OT consulted and PT recommended SNF, OT recommended CIR -CIR consulted- potential candidate. Discussed with CIR, pending further PT and insurance approval.  -Social work consulted  DVT Prophylaxis heparin  Code Status: Full  Family Communication: Friend at bedside  Disposition Plan: Admitted. Pending CLIP process. Dispo TBD (CIR vs SNF)  Consultants Cardiology/heart failure team Nephrology  Interventional radiology Infectious disease via phone Rheumatology via phone Vascular surgery Inpatient rehab  Procedures  Echocardiogram Right upper extremity vein mapping RIJ TDC, RIJ vein cannulation under [place], removal of RIJ vein temp dialysis catheter Left radiocephalic AV fistula  Antibiotics   Anti-infectives (From admission, onward)   Start     Dose/Rate Route Frequency Ordered Stop   05/10/17 0730  ceFAZolin (ANCEF) 3 g in dextrose 5 % 50 mL IVPB     3 g 160 mL/hr over 30 Minutes Intravenous To ShortStay Surgical 05/10/17 0721 05/11/17 0700   05/04/17 1215  ceFAZolin (ANCEF) 3 g in dextrose 5 % 50 mL IVPB    Comments:  Send with pt to OR   3 g 130 mL/hr over 30 Minutes Intravenous To Surgery 05/04/17 1202 05/04/17 1450   05/04/17 0600  ceFAZolin (ANCEF) IVPB 1 g/50 mL premix  Status:  Discontinued    Comments:  Send with pt to OR   1 g 100 mL/hr over 30 Minutes Intravenous To Surgery 05/03/17 1606 05/04/17 1202      Subjective:   [name] seen and examined today.  Patient currently has no new complaints.  Continues to complain  of upper extremity weakness. Denies current chest pain, shortness of breath, abdominal pain, nausea or vomiting, diarrhea constipation, headache or dizziness.    Objective:   Vitals:   05/11/17 2300 05/12/17 0129 05/12/17 0644 05/12/17 0828  BP: 125/83 129/81 123/81 120/74  Pulse: 75 79 77 79  Resp: (!) 23 17 (!) 26   Temp: 98.5 F (36.9 C)  98.9 F (37.2 C) 100 F (37.8 C)  TempSrc: Oral  Oral Oral  SpO2: 96%  96% 95%  Weight:   130.8 kg (288 lb 4.8 oz)   Height:        Intake/Output Summary (Last 24 hours) at 05/12/2017 1005 Last data filed at 05/12/2017 0644 Gross per 24 hour  Intake 240 ml  Output 5000 ml  Net -4760 ml   Filed Weights   05/11/17 0645 05/11/17 1133 05/12/17 0644  Weight: 135.6 kg (298 lb 15.1 oz) 129.4 kg (285 lb 4.4 oz) 130.8 kg (288 lb 4.8 oz)   Exam  General: Well developed, well nourished, NAD, appears stated age  46: NCAT, mucous membranes moist.   Neck: Supple  Cardiovascular: S1 S2 auscultated, no rubs, murmurs or gallops. Regular rate and rhythm.  Respiratory: Clear to auscultation bilaterally with equal chest rise  Abdomen: Soft, obese, nontender, nondistended, + bowel sounds  Extremities: warm dry without cyanosis clubbing.  Lower extremity edema improving  Neuro: AAOx3, nonfocal, upper extremity weakness bilaterally  Psych: appropriate mood and affect   Data Reviewed: I have personally reviewed following labs and imaging studies  CBC: Recent Labs  Lab 05/08/17 0337 05/09/17 0531 05/10/17 0943 05/11/17 0702 05/12/17 0346  WBC 35.0* 34.9* 33.5* 29.5* 30.1*  NEUTROABS  --  29.7*  --   --   --   HGB 9.8* 10.2* 9.7* 9.8* 10.4*  HCT 29.9* 30.5* 30.2* 29.4* 31.7*  MCV 83.8 84.3 83.7 83.3 84.8  PLT 463* 540* 540* 516* 413*   Basic Metabolic Panel: Recent Labs  Lab 05/05/17 1701  05/08/17 0337 05/08/17 1553 05/09/17 0531 05/10/17 0943 05/11/17 0702 05/12/17 0346  NA 134*   < > 132*  --  135 134* 136 136  K 3.4*   <  > 2.4* 2.8* 2.8* 3.4* 3.4* 3.6  CL 98*   < > 95*  --  99* 100* 102 100*  CO2 22   < > 23  --  23 21* 21* 24  GLUCOSE 154*   < > 231*  --  143* 208* 149* 103*  BUN 31*   < > 21*  --  22* 41* 55* 38*  CREATININE 4.09*   < > 4.03*  --  4.00* 5.32* 6.41* 4.80*  CALCIUM 8.2*   < > 8.3*  --  8.6* 8.7* 8.7* 8.8*  MG  --   --   --   --  2.0  --   --   --   PHOS 3.6  --   --   --   --   --  5.1* 4.0   < > = values in this interval not displayed.   GFR: Estimated Creatinine Clearance: 27.5 mL/min (A) (by C-G formula based on SCr of 4.8 mg/dL (H)). Liver Function Tests: Recent Labs  Lab 05/05/17 1701 05/11/17 0702 05/12/17 0346  ALBUMIN 2.5* 2.4* 2.5*   No results for input(s): LIPASE, AMYLASE in the last 168 hours. No results for input(s): AMMONIA in the last 168 hours. Coagulation Profile: No results for input(s): INR, PROTIME in the last 168 hours. Cardiac Enzymes: No results for input(s): CKTOTAL, CKMB, CKMBINDEX, TROPONINI in the last 168 hours. BNP (last 3 results) No results for input(s): PROBNP in the last 8760 hours. HbA1C: No results for input(s): HGBA1C in the last 72 hours. CBG: Recent Labs  Lab 05/10/17 2004 05/11/17 1222 05/11/17 1628 05/11/17 2304 05/12/17 0754  GLUCAP 223* 237* 194* 141* 100*   Lipid Profile: No results for input(s): CHOL, HDL, LDLCALC, TRIG, CHOLHDL, LDLDIRECT in the last 72 hours. Thyroid Function Tests: No results for input(s): TSH, T4TOTAL, FREET4, T3FREE, THYROIDAB in the last 72 hours. Anemia Panel: Recent Labs    05/11/17 0725  TIBC 279  IRON 44*   Urine analysis:    Component Value Date/Time   COLORURINE YELLOW 04/25/2017 2057   APPEARANCEUR CLEAR 04/25/2017 2057   LABSPEC 1.010 04/25/2017 2057   PHURINE 5.0 04/25/2017 2057   GLUCOSEU NEGATIVE 04/25/2017 2057   HGBUR NEGATIVE 04/25/2017 2057   BILIRUBINUR NEGATIVE 04/25/2017 2057   KETONESUR NEGATIVE 04/25/2017 2057   PROTEINUR NEGATIVE 04/25/2017 2057   NITRITE NEGATIVE  04/25/2017 2057   LEUKOCYTESUR NEGATIVE 04/25/2017 2057   Sepsis Labs: @LABRCNTIP (procalcitonin:4,lacticidven:4)  ) Recent Results (from the past 240 hour(s))  Gastrointestinal Panel by PCR , Stool     Status: None   Collection Time: 05/02/17  2:42 PM  Result Value Ref Range Status   Campylobacter species NOT DETECTED NOT DETECTED Final   Plesimonas shigelloides NOT DETECTED NOT DETECTED Final   Salmonella species NOT DETECTED NOT DETECTED Final   Yersinia enterocolitica NOT DETECTED NOT DETECTED Final  Vibrio species NOT DETECTED NOT DETECTED Final   Vibrio cholerae NOT DETECTED NOT DETECTED Final   Enteroaggregative E coli (EAEC) NOT DETECTED NOT DETECTED Final   Enteropathogenic E coli (EPEC) NOT DETECTED NOT DETECTED Final   Enterotoxigenic E coli (ETEC) NOT DETECTED NOT DETECTED Final   Shiga like toxin producing E coli (STEC) NOT DETECTED NOT DETECTED Final   Shigella/Enteroinvasive E coli (EIEC) NOT DETECTED NOT DETECTED Final   Cryptosporidium NOT DETECTED NOT DETECTED Final   Cyclospora cayetanensis NOT DETECTED NOT DETECTED Final   Entamoeba histolytica NOT DETECTED NOT DETECTED Final   Giardia lamblia NOT DETECTED NOT DETECTED Final   Adenovirus F40/41 NOT DETECTED NOT DETECTED Final   Astrovirus NOT DETECTED NOT DETECTED Final   Norovirus GI/GII NOT DETECTED NOT DETECTED Final   Rotavirus A NOT DETECTED NOT DETECTED Final   Sapovirus (I, II, IV, and V) NOT DETECTED NOT DETECTED Final    Comment: Performed at [facility], [address] [place], [place] [address]  C difficile quick scan w PCR reflex     Status: None   Collection Time: 05/02/17  2:42 PM  Result Value Ref Range Status   C Diff antigen NEGATIVE NEGATIVE Final   C Diff toxin NEGATIVE NEGATIVE Final   C Diff interpretation No C. difficile detected.  Final    Comment: Performed at [facility], [place] [address] [place], [place] [address]  Surgical pcr screen     Status: None   Collection  Time: 05/04/17  6:53 AM  Result Value Ref Range Status   MRSA, PCR NEGATIVE NEGATIVE Final   Staphylococcus aureus NEGATIVE NEGATIVE Final    Comment: (NOTE) The Xpert SA Assay (FDA approved for NASAL specimens in patients 59 years of age and older), is one component of a comprehensive surveillance program. It is not intended to diagnose infection nor to guide or monitor treatment. Performed at [facility], [place] [address] [place], [place] [address]       Radiology Studies: Dg Chest Port 1 View  Result Date: 05/04/2017 CLINICAL DATA:  Dialysis catheter placement. EXAM: PORTABLE CHEST 1 VIEW COMPARISON:  04/29/2017. FINDINGS: Right IJ dual-lumen catheter noted with tip at the cavoatrial junction. Cardiomegaly with mild pulmonary vascular prominence. No focal infiltrate. No pleural effusion or pneumothorax. IMPRESSION: 1. Right IJ dual-lumen catheter noted with tip at the cavoatrial junction. 2.  Cardiomegaly with mild pulmonary venous congestion. Electronically Signed   By: Marcello Moores  Register   On: 05/04/2017 16:23   Dg Fluoro Guide Cv Line-no Report  Result Date: 05/04/2017 Fluoroscopy was utilized by the requesting physician.  No radiographic interpretation.   Vas [place] Upper Extremity Arterial Duplex  Result Date: 05/03/2017 UPPER EXTREMITY DUPLEX STUDY Indications: Pre-op for AVF. Examination Guidelines: A complete evaluation includes B-mode imaging, spectral doppler, color doppler, and power doppler as needed of all accessible portions of each vessel. Bilateral testing is considered an integral part of a complete examination. Limited examinations for reoccurring indications may be performed as noted.  Right Pre-Dialysis Findings: +-----------------+----------+------------------+---------+--------------------+ Location         PSV (cm/s)Intralum. Diam.   Waveform Comments                                        (mm)                                             +-----------------+----------+------------------+---------+--------------------+  Brachial Antecub.50                          triphasicproximal upper arm   fossa                                                                      +-----------------+----------+------------------+---------+--------------------+ Radial Art at    51        2.60              biphasic high bifurcation in  Wrist                                                 the upper arm        +-----------------+----------+------------------+---------+--------------------+ Ulnar Art at     44        3.00              biphasic high bifurcation in  Wrist                                                 the upper arm        +-----------------+----------+------------------+---------+--------------------+ Left Pre-Dialysis Findings: +-----------------------+----------+--------------------+---------+--------+ Location               PSV (cm/s)Intralum. Diam. (mm)Waveform Comments +-----------------------+----------+--------------------+---------+--------+ Brachial Antecub. fossa47        6.20                triphasic         +-----------------------+----------+--------------------+---------+--------+ Radial Art at Wrist    67        3.20                                  +-----------------------+----------+--------------------+---------+--------+ Ulnar Art at Wrist     73        4.70                triphasic         +-----------------------+----------+--------------------+---------+--------+ Final Interpretation:  Right: No obstruction visualized in the right upper extremity. Left: No obstruction visualized in the left upper extremity. *See table(s) above for measurements and observations. Electronically signed by Curt Jews on 05/03/2017 at 7:11:24 PM.   Vas [place] Upper Ext Vein Mapping (pre-op Avf)  Result Date: 05/03/2017 UPPER EXTREMITY VEIN MAPPING History: Pre-access. Examination Guidelines: A  complete evaluation includes B-mode imaging, spectral doppler, color doppler, and power doppler as needed of all accessible portions of each vessel. Bilateral testing is considered an integral part of a complete examination. Limited examinations for reoccurring indications may be performed as noted. +-----------------+-------------+----------+---------+ Right Cephalic   Diameter (mm)Depth (mm)Findings  +-----------------+-------------+----------+---------+ Shoulder             3.80        6.30             +-----------------+-------------+----------+---------+ Prox upper arm  3.10       11.80             +-----------------+-------------+----------+---------+ Mid upper arm                           bandages  +-----------------+-------------+----------+---------+ Dist upper arm       2.40        4.60             +-----------------+-------------+----------+---------+ Antecubital fossa    1.70        9.40             +-----------------+-------------+----------+---------+ Prox forearm         3.80        9.60             +-----------------+-------------+----------+---------+ Mid forearm          2.90        9.40   branching +-----------------+-------------+----------+---------+ Dist forearm         3.20        2.80             +-----------------+-------------+----------+---------+ Wrist                1.90        2.70             +-----------------+-------------+----------+---------+ +-----------------+-------------+----------+--------------+ Right Basilic    Diameter (mm)Depth (mm)   Findings    +-----------------+-------------+----------+--------------+ Shoulder                                not visualized +-----------------+-------------+----------+--------------+ Prox upper arm                          not visualized +-----------------+-------------+----------+--------------+ Mid upper arm                           not visualized  +-----------------+-------------+----------+--------------+ Dist upper arm                          not visualized +-----------------+-------------+----------+--------------+ Antecubital fossa                       not visualized +-----------------+-------------+----------+--------------+ Prox forearm         2.10                              +-----------------+-------------+----------+--------------+ Mid forearm          1.50                              +-----------------+-------------+----------+--------------+ Distal forearm       1.60                              +-----------------+-------------+----------+--------------+ Wrist                1.40                              +-----------------+-------------+----------+--------------+ +-----------------+-------------+----------+---------+ Left Cephalic    Diameter (mm)Depth (mm)Findings  +-----------------+-------------+----------+---------+ Shoulder  3.10       20.00             +-----------------+-------------+----------+---------+ Prox upper arm       3.60       13.50             +-----------------+-------------+----------+---------+ Mid upper arm        2.90       10.00             +-----------------+-------------+----------+---------+ Dist upper arm       2.70       10.00             +-----------------+-------------+----------+---------+ Antecubital fossa    3.70        3.00             +-----------------+-------------+----------+---------+ Prox forearm         2.80        8.50   branching +-----------------+-------------+----------+---------+ Mid forearm          2.90        4.90             +-----------------+-------------+----------+---------+ Dist forearm         2.80        4.20             +-----------------+-------------+----------+---------+ Wrist                3.50        4.10             +-----------------+-------------+----------+---------+  +-----------------+-------------+----------+--------------+ Left Basilic     Diameter (mm)Depth (mm)   Findings    +-----------------+-------------+----------+--------------+ Shoulder                                not visualized +-----------------+-------------+----------+--------------+ Prox upper arm                          not visualized +-----------------+-------------+----------+--------------+ Mid upper arm                           not visualized +-----------------+-------------+----------+--------------+ Dist upper arm                          not visualized +-----------------+-------------+----------+--------------+ Antecubital fossa    3.40                              +-----------------+-------------+----------+--------------+ Prox forearm         4.00                              +-----------------+-------------+----------+--------------+ Mid forearm          1.40                              +-----------------+-------------+----------+--------------+ Distal forearm                          not visualized +-----------------+-------------+----------+--------------+ Elbow                                   not visualized +-----------------+-------------+----------+--------------+ Wrist  not visualized +-----------------+-------------+----------+--------------+ Final Interpretation: *See table(s) above for measurements and observations.  Curt Jews Electronically signed by Curt Jews on 05/03/2017 at 7:10:36 PM.      Scheduled Meds: . amiodarone  200 mg Oral BID  . apixaban  5 mg Oral BID  . carvedilol  18.75 mg Oral BID WC  . darbepoetin (ARANESP) injection - DIALYSIS  100 mcg Intravenous Q Tue-HD  . fluticasone  2 spray Each Nare Daily  . gabapentin  300 mg Oral Daily  . insulin aspart  0-15 Units Subcutaneous TID WC  . insulin aspart  0-5 Units Subcutaneous QHS  . insulin aspart  3 Units Subcutaneous TID  WC  . insulin glargine  25 Units Subcutaneous QHS  . multivitamin  1 tablet Oral QHS  . predniSONE  20 mg Oral Q breakfast  . sodium chloride flush  3 mL Intravenous Q12H  . sodium chloride flush  3 mL Intravenous Q12H   Continuous Infusions: . sodium chloride    . sodium chloride    . sodium chloride 10 mL/hr at 05/04/17 1220     LOS: 20 days   Time Spent in minutes   30 minutes  Loreal Schuessler D.O. on 05/12/2017 at 10:05 AM  Between 7am to 7pm - Pager - [phone]  After 7pm go to [email or web address] - password [email or web address]  And look for the night coverage person covering for me after hours  [facility] Office  [phone]

## 2017-05-12 NOTE — Progress Notes (Signed)
Inpatient Rehabilitation  Note that patient has been clipped for HD T,Th,Sat.  Given his steadily improving therapy tolerance will initiate insurance authorization with [employer].  Call if questions.   Carmelia Roller., CCC/SLP Admission Coordinator  [place]  Cell [phone]

## 2017-05-12 NOTE — Plan of Care (Signed)
Patient maintains oxygen saturation levels above 95% on room air. No complaints of dyspnea or signs of distress

## 2017-05-13 ENCOUNTER — Other Ambulatory Visit: Payer: Self-pay | Admitting: Cardiovascular Disease

## 2017-05-13 ENCOUNTER — Inpatient Hospital Stay (HOSPITAL_COMMUNITY)
Admission: RE | Admit: 2017-05-13 | Discharge: 2017-06-08 | DRG: 939 | Disposition: A | Discharge status: Other Acute Inpatient Hospital | Payer: BLUE CROSS/BLUE SHIELD | Source: Intra-hospital | Attending: Physical Medicine & Rehabilitation | Admitting: Physical Medicine & Rehabilitation

## 2017-05-13 ENCOUNTER — Other Ambulatory Visit: Payer: Self-pay

## 2017-05-13 ENCOUNTER — Telehealth: Payer: Self-pay | Admitting: *Deleted

## 2017-05-13 ENCOUNTER — Encounter (HOSPITAL_COMMUNITY): Payer: Self-pay | Admitting: Nurse Practitioner

## 2017-05-13 DIAGNOSIS — Z794 Long term (current) use of insulin: Secondary | ICD-10-CM

## 2017-05-13 DIAGNOSIS — M792 Neuralgia and neuritis, unspecified: Secondary | ICD-10-CM

## 2017-05-13 DIAGNOSIS — E1151 Type 2 diabetes mellitus with diabetic peripheral angiopathy without gangrene: Secondary | ICD-10-CM | POA: Diagnosis present

## 2017-05-13 DIAGNOSIS — I1310 Hypertensive heart and chronic kidney disease without heart failure, with stage 1 through stage 4 chronic kidney disease, or unspecified chronic kidney disease: Secondary | ICD-10-CM

## 2017-05-13 DIAGNOSIS — E876 Hypokalemia: Secondary | ICD-10-CM | POA: Diagnosis present

## 2017-05-13 DIAGNOSIS — N186 End stage renal disease: Secondary | ICD-10-CM | POA: Diagnosis present

## 2017-05-13 DIAGNOSIS — Z992 Dependence on renal dialysis: Secondary | ICD-10-CM

## 2017-05-13 DIAGNOSIS — R739 Hyperglycemia, unspecified: Secondary | ICD-10-CM

## 2017-05-13 DIAGNOSIS — Z8349 Family history of other endocrine, nutritional and metabolic diseases: Secondary | ICD-10-CM

## 2017-05-13 DIAGNOSIS — I4891 Unspecified atrial fibrillation: Secondary | ICD-10-CM

## 2017-05-13 DIAGNOSIS — N19 Unspecified kidney failure: Secondary | ICD-10-CM

## 2017-05-13 DIAGNOSIS — T380X5A Adverse effect of glucocorticoids and synthetic analogues, initial encounter: Secondary | ICD-10-CM | POA: Diagnosis present

## 2017-05-13 DIAGNOSIS — Z91199 Patient's noncompliance with other medical treatment and regimen due to unspecified reason: Secondary | ICD-10-CM

## 2017-05-13 DIAGNOSIS — E871 Hypo-osmolality and hyponatremia: Secondary | ICD-10-CM | POA: Diagnosis not present

## 2017-05-13 DIAGNOSIS — Z6838 Body mass index (BMI) 38.0-38.9, adult: Secondary | ICD-10-CM | POA: Diagnosis not present

## 2017-05-13 DIAGNOSIS — R400 Somnolence: Secondary | ICD-10-CM

## 2017-05-13 DIAGNOSIS — M1711 Unilateral primary osteoarthritis, right knee: Secondary | ICD-10-CM | POA: Diagnosis present

## 2017-05-13 DIAGNOSIS — Z9119 Patient's noncompliance with other medical treatment and regimen: Secondary | ICD-10-CM

## 2017-05-13 DIAGNOSIS — E1142 Type 2 diabetes mellitus with diabetic polyneuropathy: Secondary | ICD-10-CM | POA: Diagnosis present

## 2017-05-13 DIAGNOSIS — M65842 Other synovitis and tenosynovitis, left hand: Secondary | ICD-10-CM | POA: Diagnosis present

## 2017-05-13 DIAGNOSIS — D631 Anemia in chronic kidney disease: Secondary | ICD-10-CM | POA: Diagnosis present

## 2017-05-13 DIAGNOSIS — I5033 Acute on chronic diastolic (congestive) heart failure: Secondary | ICD-10-CM | POA: Diagnosis present

## 2017-05-13 DIAGNOSIS — B002 Herpesviral gingivostomatitis and pharyngotonsillitis: Secondary | ICD-10-CM | POA: Diagnosis not present

## 2017-05-13 DIAGNOSIS — D899 Disorder involving the immune mechanism, unspecified: Secondary | ICD-10-CM | POA: Diagnosis present

## 2017-05-13 DIAGNOSIS — D72829 Elevated white blood cell count, unspecified: Secondary | ICD-10-CM | POA: Diagnosis not present

## 2017-05-13 DIAGNOSIS — E1122 Type 2 diabetes mellitus with diabetic chronic kidney disease: Secondary | ICD-10-CM | POA: Diagnosis present

## 2017-05-13 DIAGNOSIS — R52 Pain, unspecified: Secondary | ICD-10-CM | POA: Diagnosis not present

## 2017-05-13 DIAGNOSIS — L89150 Pressure ulcer of sacral region, unstageable: Secondary | ICD-10-CM | POA: Diagnosis present

## 2017-05-13 DIAGNOSIS — M65841 Other synovitis and tenosynovitis, right hand: Secondary | ICD-10-CM | POA: Diagnosis present

## 2017-05-13 DIAGNOSIS — M069 Rheumatoid arthritis, unspecified: Secondary | ICD-10-CM | POA: Diagnosis present

## 2017-05-13 DIAGNOSIS — Z7952 Long term (current) use of systemic steroids: Secondary | ICD-10-CM

## 2017-05-13 DIAGNOSIS — I96 Gangrene, not elsewhere classified: Secondary | ICD-10-CM | POA: Diagnosis present

## 2017-05-13 DIAGNOSIS — D62 Acute posthemorrhagic anemia: Secondary | ICD-10-CM | POA: Diagnosis present

## 2017-05-13 DIAGNOSIS — R7309 Other abnormal glucose: Secondary | ICD-10-CM | POA: Diagnosis not present

## 2017-05-13 DIAGNOSIS — I132 Hypertensive heart and chronic kidney disease with heart failure and with stage 5 chronic kidney disease, or end stage renal disease: Secondary | ICD-10-CM | POA: Diagnosis present

## 2017-05-13 DIAGNOSIS — R5381 Other malaise: Principal | ICD-10-CM | POA: Diagnosis present

## 2017-05-13 DIAGNOSIS — M23206 Derangement of unspecified meniscus due to old tear or injury, right knee: Secondary | ICD-10-CM | POA: Diagnosis present

## 2017-05-13 DIAGNOSIS — I480 Paroxysmal atrial fibrillation: Secondary | ICD-10-CM

## 2017-05-13 DIAGNOSIS — G5602 Carpal tunnel syndrome, left upper limb: Secondary | ICD-10-CM | POA: Diagnosis present

## 2017-05-13 DIAGNOSIS — D638 Anemia in other chronic diseases classified elsewhere: Secondary | ICD-10-CM

## 2017-05-13 DIAGNOSIS — R0989 Other specified symptoms and signs involving the circulatory and respiratory systems: Secondary | ICD-10-CM

## 2017-05-13 DIAGNOSIS — E1165 Type 2 diabetes mellitus with hyperglycemia: Secondary | ICD-10-CM | POA: Diagnosis present

## 2017-05-13 DIAGNOSIS — Z8249 Family history of ischemic heart disease and other diseases of the circulatory system: Secondary | ICD-10-CM

## 2017-05-13 DIAGNOSIS — R5383 Other fatigue: Secondary | ICD-10-CM

## 2017-05-13 DIAGNOSIS — N184 Chronic kidney disease, stage 4 (severe): Secondary | ICD-10-CM

## 2017-05-13 DIAGNOSIS — K5900 Constipation, unspecified: Secondary | ICD-10-CM | POA: Diagnosis not present

## 2017-05-13 DIAGNOSIS — B009 Herpesviral infection, unspecified: Secondary | ICD-10-CM

## 2017-05-13 HISTORY — DX: Rheumatoid arthritis, unspecified: M06.9

## 2017-05-13 HISTORY — DX: Cardiac arrhythmia, unspecified: I49.9

## 2017-05-13 HISTORY — DX: Peripheral vascular disease, unspecified: I73.9

## 2017-05-13 LAB — CBC
HCT: 29.6 % — ABNORMAL LOW (ref 39.0–52.0)
Hemoglobin: 9.8 g/dL — ABNORMAL LOW (ref 13.0–17.0)
MCH: 27.7 pg (ref 26.0–34.0)
MCHC: 33.1 g/dL (ref 30.0–36.0)
MCV: 83.6 fL (ref 78.0–100.0)
Platelets: 414 10*3/uL — ABNORMAL HIGH (ref 150–400)
RBC: 3.54 MIL/uL — ABNORMAL LOW (ref 4.22–5.81)
RDW: 19.1 % — ABNORMAL HIGH (ref 11.5–15.5)
WBC: 28.6 10*3/uL — AB (ref 4.0–10.5)

## 2017-05-13 LAB — RENAL FUNCTION PANEL
Albumin: 2.4 g/dL — ABNORMAL LOW (ref 3.5–5.0)
Anion gap: 15 (ref 5–15)
BUN: 58 mg/dL — AB (ref 6–20)
CHLORIDE: 99 mmol/L — AB (ref 101–111)
CO2: 20 mmol/L — ABNORMAL LOW (ref 22–32)
Calcium: 8.3 mg/dL — ABNORMAL LOW (ref 8.9–10.3)
Creatinine, Ser: 6.52 mg/dL — ABNORMAL HIGH (ref 0.61–1.24)
GFR calc Af Amer: 11 mL/min — ABNORMAL LOW (ref >60)
GFR calc non Af Amer: 9 mL/min — ABNORMAL LOW (ref >60)
Glucose, Bld: 178 mg/dL — ABNORMAL HIGH (ref 65–99)
Phosphorus: 4.9 mg/dL — ABNORMAL HIGH (ref 2.5–4.6)
Potassium: 3.5 mmol/L (ref 3.5–5.1)
Sodium: 134 mmol/L — ABNORMAL LOW (ref 135–145)

## 2017-05-13 LAB — GLUCOSE, CAPILLARY
GLUCOSE-CAPILLARY: 175 mg/dL — AB (ref 65–99)
Glucose-Capillary: 205 mg/dL — ABNORMAL HIGH (ref 65–99)
Glucose-Capillary: 202 mg/dL — ABNORMAL HIGH (ref 65–99)
Glucose-Capillary: 160 mg/dL — ABNORMAL HIGH (ref 65–99)

## 2017-05-13 MED ORDER — CALCITRIOL 0.5 MCG PO CAPS
0.5000 ug | ORAL_CAPSULE | ORAL | Status: DC
  Administered 2017-05-15 – 2017-05-17 (×2): 0.5 ug via ORAL
  Filled 2017-05-13 (×2): qty 1

## 2017-05-13 MED ORDER — AMIODARONE HCL 200 MG PO TABS: 200.0000 mg | ORAL_TABLET | Freq: Every day | ORAL | Status: DC

## 2017-05-13 MED ORDER — DARBEPOETIN ALFA 100 MCG/0.5ML IJ SOSY
100.0000 ug | PREFILLED_SYRINGE | INTRAMUSCULAR | Status: DC
  Administered 2017-05-18: 100 ug via INTRAVENOUS

## 2017-05-13 MED ORDER — PREDNISONE 10 MG PO TABS
10.0000 mg | ORAL_TABLET | Freq: Every day | ORAL | Status: DC
  Administered 2017-05-14 – 2017-05-19 (×6): 10 mg via ORAL
  Filled 2017-05-13 (×6): qty 1

## 2017-05-13 MED ORDER — DICLOFENAC SODIUM 1 % TD GEL
2.0000 g | Freq: Four times a day (QID) | TRANSDERMAL | Status: DC | PRN
  Administered 2017-05-13 – 2017-05-23 (×3): 2 g via TOPICAL
  Filled 2017-05-13: qty 100

## 2017-05-13 MED ORDER — AMIODARONE HCL 200 MG PO TABS
200.0000 mg | ORAL_TABLET | Freq: Two times a day (BID) | ORAL | Status: DC
  Administered 2017-05-13 – 2017-05-15 (×4): 200 mg via ORAL
  Filled 2017-05-13 (×4): qty 1

## 2017-05-13 MED ORDER — HEPARIN SODIUM (PORCINE) 1000 UNIT/ML DIALYSIS: 100.0000 [IU]/kg | INTRAMUSCULAR | Status: DC | PRN

## 2017-05-13 MED ORDER — METHOCARBAMOL 500 MG PO TABS
500.0000 mg | ORAL_TABLET | Freq: Three times a day (TID) | ORAL | Status: DC | PRN
  Filled 2017-05-13 (×3): qty 1

## 2017-05-13 MED ORDER — FLUTICASONE PROPIONATE 50 MCG/ACT NA SUSP
2.0000 | Freq: Every day | NASAL | Status: DC
  Administered 2017-05-15 – 2017-06-02 (×5): 2 via NASAL
  Filled 2017-05-13 (×4): qty 16

## 2017-05-13 MED ORDER — AMIODARONE HCL 200 MG PO TABS: 200.0000 mg | ORAL_TABLET | Freq: Two times a day (BID) | ORAL | 0 refills | Status: DC

## 2017-05-13 MED ORDER — INSULIN GLARGINE 100 UNIT/ML ~~LOC~~ SOLN
25.0000 [IU] | Freq: Every day | SUBCUTANEOUS | Status: DC
  Administered 2017-05-13 – 2017-05-20 (×8): 25 [IU] via SUBCUTANEOUS
  Filled 2017-05-13 (×8): qty 0.25

## 2017-05-13 MED ORDER — GABAPENTIN 300 MG PO CAPS: 300.0000 mg | ORAL_CAPSULE | Freq: Every day | ORAL | 0 refills | Status: DC

## 2017-05-13 MED ORDER — SODIUM CHLORIDE 0.9 % IV SOLN: 100.0000 mL | INTRAVENOUS | Status: DC | PRN

## 2017-05-13 MED ORDER — APIXABAN 5 MG PO TABS
5.0000 mg | ORAL_TABLET | Freq: Two times a day (BID) | ORAL | Status: DC
  Administered 2017-05-13 – 2017-06-02 (×40): 5 mg via ORAL
  Filled 2017-05-13 (×40): qty 1

## 2017-05-13 MED ORDER — CARVEDILOL 6.25 MG PO TABS: 6.2500 mg | ORAL_TABLET | Freq: Two times a day (BID) | ORAL | Status: DC

## 2017-05-13 MED ORDER — RENA-VITE PO TABS
1.0000 | ORAL_TABLET | Freq: Every day | ORAL | Status: DC
  Administered 2017-05-13 – 2017-05-23 (×10): 1 via ORAL
  Filled 2017-05-13 (×10): qty 1

## 2017-05-13 MED ORDER — GABAPENTIN 300 MG PO CAPS
300.0000 mg | ORAL_CAPSULE | Freq: Every day | ORAL | Status: DC
  Administered 2017-05-14 – 2017-06-03 (×21): 300 mg via ORAL
  Filled 2017-05-13 (×21): qty 1

## 2017-05-13 MED ORDER — HEPARIN SODIUM (PORCINE) 1000 UNIT/ML DIALYSIS: 1000.0000 [IU] | INTRAMUSCULAR | Status: DC | PRN

## 2017-05-13 MED ORDER — LIDOCAINE-PRILOCAINE 2.5-2.5 % EX CREA: 1.0000 "application " | TOPICAL_CREAM | CUTANEOUS | Status: DC | PRN

## 2017-05-13 MED ORDER — ALTEPLASE 2 MG IJ SOLR: 2.0000 mg | Freq: Once | INTRAMUSCULAR | Status: DC | PRN

## 2017-05-13 MED ORDER — CALCITRIOL 0.5 MCG PO CAPS: 0.5000 ug | ORAL_CAPSULE | ORAL | 0 refills | Status: DC

## 2017-05-13 MED ORDER — SODIUM CHLORIDE 0.9 % IV SOLN
100.0000 mL | INTRAVENOUS | Status: DC | PRN
Start: 2017-05-13 — End: 2017-05-13

## 2017-05-13 MED ORDER — INSULIN ASPART 100 UNIT/ML ~~LOC~~ SOLN: 3.0000 [IU] | Freq: Three times a day (TID) | SUBCUTANEOUS | 11 refills | Status: DC

## 2017-05-13 MED ORDER — ONDANSETRON HCL 4 MG/2ML IJ SOLN
4.0000 mg | Freq: Four times a day (QID) | INTRAMUSCULAR | Status: DC | PRN
  Administered 2017-06-04: 4 mg via INTRAVENOUS

## 2017-05-13 MED ORDER — PENTAFLUOROPROP-TETRAFLUOROETH EX AERO: 1.0000 "application " | INHALATION_SPRAY | CUTANEOUS | Status: DC | PRN

## 2017-05-13 MED ORDER — LIDOCAINE HCL (PF) 1 % IJ SOLN: 5.0000 mL | INTRAMUSCULAR | Status: DC | PRN

## 2017-05-13 MED ORDER — SORBITOL 70 % SOLN
30.0000 mL | Freq: Every day | Status: DC | PRN
  Filled 2017-05-13: qty 30

## 2017-05-13 MED ORDER — ONDANSETRON HCL 4 MG PO TABS: 4.0000 mg | ORAL_TABLET | Freq: Four times a day (QID) | ORAL | Status: DC | PRN

## 2017-05-13 MED ORDER — INSULIN ASPART 100 UNIT/ML ~~LOC~~ SOLN
0.0000 [IU] | Freq: Three times a day (TID) | SUBCUTANEOUS | Status: DC
  Administered 2017-05-14: 5 [IU] via SUBCUTANEOUS
  Administered 2017-05-14: 3 [IU] via SUBCUTANEOUS
  Administered 2017-05-14: 5 [IU] via SUBCUTANEOUS
  Administered 2017-05-15 (×2): 3 [IU] via SUBCUTANEOUS
  Administered 2017-05-16: 2 [IU] via SUBCUTANEOUS
  Administered 2017-05-16 (×2): 5 [IU] via SUBCUTANEOUS
  Administered 2017-05-17: 8 [IU] via SUBCUTANEOUS
  Administered 2017-05-17: 15 [IU] via SUBCUTANEOUS
  Administered 2017-05-17: 3 [IU] via SUBCUTANEOUS
  Administered 2017-05-19 (×2): 2 [IU] via SUBCUTANEOUS
  Administered 2017-05-19 – 2017-05-20 (×2): 3 [IU] via SUBCUTANEOUS
  Administered 2017-05-20 (×2): 8 [IU] via SUBCUTANEOUS
  Administered 2017-05-21: 3 [IU] via SUBCUTANEOUS
  Administered 2017-05-21: 5 [IU] via SUBCUTANEOUS
  Administered 2017-05-21: 8 [IU] via SUBCUTANEOUS
  Administered 2017-05-22 (×2): 5 [IU] via SUBCUTANEOUS
  Administered 2017-05-22: 3 [IU] via SUBCUTANEOUS
  Administered 2017-05-23: 8 [IU] via SUBCUTANEOUS
  Administered 2017-05-23 (×2): 3 [IU] via SUBCUTANEOUS
  Administered 2017-05-24: 11 [IU] via SUBCUTANEOUS
  Administered 2017-05-24: 2 [IU] via SUBCUTANEOUS
  Administered 2017-05-24 – 2017-05-25 (×2): 8 [IU] via SUBCUTANEOUS
  Administered 2017-05-25: 3 [IU] via SUBCUTANEOUS
  Administered 2017-05-26: 11 [IU] via SUBCUTANEOUS
  Administered 2017-05-26 (×2): 3 [IU] via SUBCUTANEOUS
  Administered 2017-05-27: 5 [IU] via SUBCUTANEOUS
  Administered 2017-05-27: 3 [IU] via SUBCUTANEOUS
  Administered 2017-05-28: 2 [IU] via SUBCUTANEOUS
  Administered 2017-05-28: 5 [IU] via SUBCUTANEOUS
  Administered 2017-05-29: 15 [IU] via SUBCUTANEOUS
  Administered 2017-05-30: 8 [IU] via SUBCUTANEOUS
  Administered 2017-05-30: 3 [IU] via SUBCUTANEOUS
  Administered 2017-05-31: 5 [IU] via SUBCUTANEOUS
  Administered 2017-05-31: 2 [IU] via SUBCUTANEOUS
  Administered 2017-05-31: 8 [IU] via SUBCUTANEOUS
  Administered 2017-06-01: 2 [IU] via SUBCUTANEOUS
  Administered 2017-06-01: 8 [IU] via SUBCUTANEOUS
  Administered 2017-06-02 (×2): 3 [IU] via SUBCUTANEOUS
  Administered 2017-06-02: 11 [IU] via SUBCUTANEOUS
  Administered 2017-06-03: 2 [IU] via SUBCUTANEOUS

## 2017-05-13 MED ORDER — CALCITRIOL 0.5 MCG PO CAPS: 0.5000 ug | ORAL_CAPSULE | ORAL | Status: DC

## 2017-05-13 MED ORDER — OXYCODONE-ACETAMINOPHEN 5-325 MG PO TABS
1.0000 | ORAL_TABLET | Freq: Four times a day (QID) | ORAL | Status: DC | PRN
Start: 2017-05-13 — End: 2017-06-04
  Administered 2017-05-14 – 2017-06-01 (×8): 1 via ORAL
  Filled 2017-05-13 (×12): qty 1

## 2017-05-13 MED ORDER — CARVEDILOL 6.25 MG PO TABS: 6.2500 mg | ORAL_TABLET | Freq: Two times a day (BID) | ORAL | 0 refills | Status: DC

## 2017-05-13 MED ORDER — METHOCARBAMOL 500 MG PO TABS: 500.0000 mg | ORAL_TABLET | Freq: Three times a day (TID) | ORAL | 0 refills | Status: DC | PRN

## 2017-05-13 MED ORDER — APIXABAN 5 MG PO TABS: 5.0000 mg | ORAL_TABLET | Freq: Two times a day (BID) | ORAL | 0 refills | Status: DC

## 2017-05-13 MED ORDER — ACETAMINOPHEN 325 MG PO TABS
650.0000 mg | ORAL_TABLET | ORAL | Status: DC | PRN
  Administered 2017-05-13 – 2017-06-03 (×8): 650 mg via ORAL
  Filled 2017-05-13 (×9): qty 2

## 2017-05-13 MED ORDER — CARVEDILOL 6.25 MG PO TABS
6.2500 mg | ORAL_TABLET | Freq: Two times a day (BID) | ORAL | Status: DC
  Administered 2017-05-14: 6.25 mg via ORAL
  Filled 2017-05-13: qty 1

## 2017-05-13 MED ORDER — HYOSCYAMINE SULFATE 0.125 MG SL SUBL
0.2500 mg | SUBLINGUAL_TABLET | Freq: Four times a day (QID) | SUBLINGUAL | Status: DC | PRN
  Filled 2017-05-13: qty 2

## 2017-05-13 MED ORDER — PREDNISONE 10 MG PO TABS: 10.0000 mg | ORAL_TABLET | Freq: Every day | ORAL | 0 refills | Status: DC

## 2017-05-13 MED ORDER — INSULIN ASPART 100 UNIT/ML ~~LOC~~ SOLN
3.0000 [IU] | Freq: Three times a day (TID) | SUBCUTANEOUS | Status: DC
  Administered 2017-05-14 – 2017-05-17 (×9): 3 [IU] via SUBCUTANEOUS

## 2017-05-13 MED ORDER — OXYCODONE-ACETAMINOPHEN 5-325 MG PO TABS: 1.0000 | ORAL_TABLET | Freq: Four times a day (QID) | ORAL | Status: DC | PRN

## 2017-05-13 NOTE — H&P (Signed)
Physical Medicine and Rehabilitation Admission H&P        Chief Complaint  Patient presents with  . Generalized body swelling  : HPI: John Bailey is a 47 y.o. right handed male with history of rheumatoid arthritis maintained on chronic prednisone, peripheral vascular disease with lower extremity ischemic changes followed at the wound care center, diabetes mellitus, chronic fluid overload, atrial flutter with cardioversion maintained on Xarelto. Per chart review patient lives with spouse. Wife works during the day. One level home with a basement. He goes upstairs each night with 8-10 steps. Patient used a cane prior to admission. He needed assistance from sit to stand from lift chair. He had noted a two-week decline in his energy level. Presented 04/22/2017 with increasing weight gain as well as shortness of breath and difficulty with ambulation. His oxygen saturations were in the 80s with ambulation. Noted Lasix recently adjusted. BUN 51, creatinine 2.65, troponin negative. Chest x-ray showed interstitial pulmonary edema. Renal ultrasound no hydronephrosis. Echocardiogram with ejection fraction of 60% with severe LVH. Patient placed on intravenous Lasix. Renal services consulted for elevating creatinine with dialysis initiated and underwent placement of right IJ dialysis tunneled catheter and has been transitioned to permanent dialysis on a Tuesday Thursday Saturday schedule and latest creatinine of 5.22 with left AVF placed per Dr. early. Cardiology services consulted from by atrial fibrillation and underwent cardioversion 04/30/2017 currently placed on intravenous heparin and later transitioned to Eliquis. Leukocytosis monitored while on prednisone that has been tapered. Physical and occupational therapy evaluations have continue to follow. Physical occupational Occupational therapy evaluations completed 05/05/2017 with recommendations of CIR. Patient was admitted for a comprehensive rehabilitation  program       Review of Systems  Constitutional: Negative for chills and fever.  HENT: Negative for hearing loss.   Eyes: Negative for blurred vision.  Respiratory: Positive for shortness of breath.   Cardiovascular: Positive for palpitations and leg swelling.  Gastrointestinal: Positive for constipation. Negative for nausea and vomiting.  Musculoskeletal: Positive for joint pain and myalgias.  Skin: Negative for rash.  Neurological: Negative for seizures and headaches.  All other systems reviewed and are negative.       Past Medical History:  Diagnosis Date  . Atrial flutter with rapid ventricular response ([place]) 08/06/2015    a. s/p DCCV in 07/2015 with recurrent atrial fibrillation and DCCV in 12/2016. Initially successful but noted to be back in atrial fibrillation within 2 weeks of DCCV.   [name] Chronic diastolic (congestive) heart failure (HCC)      a. EF 50-55% by echo in 06/2016.  . Diabetes ([place])    . Hypertension           Past Surgical History:  Procedure Laterality Date  . AV FISTULA PLACEMENT Left 05/10/2017    Procedure: CREATION of Left Radicephalic Fistula;  Surgeon: Rosetta Posner, MD;  Location: [facility];  Service: Vascular;  Laterality: Left;  . CARDIOVERSION N/A 08/01/2015    Procedure: CARDIOVERSION;  Surgeon: Josue Hector, MD;  Location: [facility] ENDOSCOPY;  Service: Cardiovascular;  Laterality: N/A;  . CARDIOVERSION N/A 12/18/2016    Procedure: CARDIOVERSION;  Surgeon: [name], MD;  Location: [facility] ENDOSCOPY;  Service: Cardiovascular;  Laterality: N/A;  . CARDIOVERSION N/A 04/30/2017    Procedure: CARDIOVERSION;  Surgeon: Larey Dresser, MD;  Location: [facility] ENDOSCOPY;  Service: Cardiovascular;  Laterality: N/A;  . CHOLECYSTECTOMY      . INSERTION OF DIALYSIS CATHETER Right 05/04/2017    Procedure: INSERTION OF TUNNELED  DIALYSIS CATHETER;  Surgeon: Conrad Coldstream, MD;  Location: [place];  Service: Vascular;  Laterality: Right;  . IR FLUORO GUIDE CV LINE RIGHT    04/27/2017  . IR US GUIDE VASC ACCESS RIGHT   04/27/2017  . TEE WITHOUT CARDIOVERSION N/A 08/01/2015    Procedure: TRANSESOPHAGEAL ECHOCARDIOGRAM (TEE);  Surgeon: Josue Hector, MD;  Location: [facility] ENDOSCOPY;  Service: Cardiovascular;  Laterality: N/A;         Family History  Problem Relation Age of Onset  . Heart disease Mother    . Hyperlipidemia Father    . Hypertension Father      Social History:  reports that  has never smoked. he has never used smokeless tobacco. He reports that he does not drink alcohol or use drugs. Allergies: No Known Allergies       Medications Prior to Admission  Medication Sig Dispense Refill  . Abatacept 125 MG/ML SOAJ Inject 125 mg into the skin every 7 (seven) days. On Tuesday      . acetaminophen (TYLENOL) 325 MG tablet Take 650 mg by mouth every 6 (six) hours as needed for mild pain.      [name] albuterol (PROVENTIL HFA;VENTOLIN HFA) 108 (90 BASE) MCG/ACT inhaler Inhale 1-2 puffs into the lungs every 6 (six) hours as needed for wheezing. 1 Inhaler 3  . carvedilol (COREG) 12.5 MG tablet Take 1 tablet (12.5 mg total) by mouth 2 (two) times daily with a meal. 2 tablet 0  . celecoxib (CELEBREX) 100 MG capsule Take 100 mg by mouth daily.   0  . colchicine 0.6 MG tablet Take 0.5 tablets (0.3 mg total) by mouth daily. 30 tablet 0  . diltiazem (CARDIZEM CD) 180 MG 24 hr capsule Take 1 capsule (180 mg total) by mouth daily. 30 capsule 0  . fluticasone (FLONASE) 50 MCG/ACT nasal spray Place 2 sprays into the nose daily.       . furosemide (LASIX) 40 MG tablet Take 40 mg by mouth 2 (two) times daily.   3  . gabapentin (NEURONTIN) 300 MG capsule Take 1 capsule (300 mg total) by mouth 2 (two) times daily. (Patient taking differently: Take 300 mg by mouth 3 (three) times daily. ) 60 capsule 0  . insulin aspart (NOVOLOG FLEXPEN) 100 UNIT/ML FlexPen Inject 0-12 Units into the skin 3 (three) times daily as needed for high blood sugar. Sliding Scale <70, initiate hypoglycemia  protocol, 70-100=0 units, 110-199= 3 units, 200-250=5 units, 251-300=7 units, 301-350=10 units, 351-400=12 units, >400=14 units. Call MD if >450 15 mL 11  . Insulin Glargine (LANTUS) 100 UNIT/ML Solostar Pen Inject 25 Units into the skin daily at 10 pm. 15 mL 11  . potassium chloride SA (KLOR-CON M20) 20 MEQ tablet Take 1 tablet (20 mEq total) by mouth daily. 90 tablet 3  . rivaroxaban (XARELTO) 20 MG TABS tablet take 1 tablet by mouth once daily WITH SUPPER (Patient taking differently: Take 20 mg by mouth daily with supper. ) 30 tablet 0  . Insulin Pen Needle 31G X 6 MM MISC 1 Device by Does not apply route 2 (two) times daily. 60 each 3      Drug Regimen Review Drug regimen was reviewed and remains appropriate with no significant issues identified   Home: Home Living Family/patient expects to be discharged to:: Private residence Living Arrangements: Spouse/significant other Available Help at Discharge: Family Type of Home: House Home Access: Level entry Home Layout: (stays in basement downstairs, goes upstairs each night) Alternate Level  Stairs-Number of Steps: 2 flights (8-10 each) Alternate Level Stairs-Rails: Left Bathroom Shower/Tub: Multimedia programmer: Standard Home Equipment: Cane - single point   Functional History: Prior Function Level of Independence: Needs assistance Gait / Transfers Assistance Needed: Required assistance for standing from lift chair as it is not functioning properly ADL's / Homemaking Assistance Needed: Required assist to rise out of his lift chair due to chair breaking. Walks up his stairs each night   Functional Status:  Mobility: Bed Mobility Overal bed mobility: Needs Assistance Bed Mobility: Supine to Sit Rolling: Min guard Supine to sit: Min guard Sit to supine: Mod assist, +2 for safety/equipment General bed mobility comments: Min guard for safety. Verbal cues for hand placement on rails. Required increased time.   Transfers Overall transfer level: Needs assistance Equipment used: Rolling walker (2 wheeled) Transfer via Lift Equipment: [employer] Transfers: Sit to/from Stand, [employer] Transfers Sit to Stand: Mod assist, +2 physical assistance, +2 safety/equipment Stand pivot transfers: Mod assist, +2 physical assistance, +2 safety/equipment Squat pivot transfers: +2 physical assistance, +2 safety/equipment General transfer comment: Heavy mod A +2 to power into standing. Educated about appropriate use of RW and required cues for upright posture. Slight L knee buckling noted during transfer and required heavy mod A +2 using RW to transfer to chair.  Ambulation/Gait Ambulation/Gait assistance: (NT) General Gait Details: Able to take side steps along EOB and over to chair during stand pivot transfer. Verbal and manual assist required for RW use.    ADL: ADL Overall ADL's : Needs assistance/impaired Eating/Feeding: Set up, With adaptive utensils, Sitting Eating/Feeding Details (indicate cue type and reason): Pt able to utilize built-up handle for self-feeding with supervision this session.  Grooming: Maximal assistance, Sitting Upper Body Bathing: Maximal assistance, Sitting Lower Body Bathing: Total assistance, Sitting/lateral leans Upper Body Dressing : Maximal assistance, Sitting Lower Body Dressing: Total assistance Lower Body Dressing Details (indicate cue type and reason): totalA to doff/don socks this session  Toilet Transfer: Moderate assistance, +2 for physical assistance, Stand-pivot Toilet Transfer Details (indicate cue type and reason): Simulated from bed to recliner. Mod assist +2 to power up and extended time to control movements.  Toileting- Clothing Manipulation and Hygiene: Total assistance, Sit to/from stand Toileting - Clothing Manipulation Details (indicate cue type and reason): +2 providing standing assist while additional assist provides peri-care after BM  Functional mobility  during ADLs: Moderate assistance, +2 for physical assistance(stand-pivot only) General ADL Comments: Pt motivated for OOB transfer this session.    Cognition: Cognition Overall Cognitive Status: Within Functional Limits for tasks assessed Orientation Level: Oriented X4 Cognition Arousal/Alertness: Awake/alert Behavior During Therapy: WFL for tasks assessed/performed Overall Cognitive Status: Within Functional Limits for tasks assessed General Comments: at times slow to respond but appears [facility]   Physical Exam: Blood pressure 119/72, pulse 79, temperature 99.5 F (37.5 C), temperature source Oral, resp. rate (!) 26, height 6' (1.829 m), weight 130.8 kg (288 lb 4.8 oz), SpO2 92 %. Physical Exam Vitals reviewed. Constitutional: He is oriented to person, place, and time.  47 year old right-handed obese male  HENT:  Head: Normocephalic.  Eyes: EOM are normal.  Neck: Normal range of motion. Neck supple. No thyromegaly present.  Cardiovascular:  Cardiac rate controlled  Respiratory: Effort normal and breath sounds normal. No respiratory distress.  GI: Soft. Bowel sounds are normal. He exhibits no distension.  Neurological: He is alert and oriented to person, place, and time Skin. Unna boots have been discontinued. Patient does have some ischemic changes.  Positive pedal pulses Motor strength is 3- bilateral grip secondary to pain and joint stiffness.  Biceps triceps are 4- right deltoid 4 8 left deltoid 3- Lower extremity 3- hip flexion knee extension and ankle dorsiflexion bilaterally. 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  Pain with bilateral knee ROM but no effusion       Lab Results Last 48 Hours        Results for orders placed or performed during the  hospital encounter of 04/22/17 (from the past 48 hour(s))  Glucose, capillary     Status: Abnormal    Collection Time: 05/10/17  4:09 PM  Result Value Ref Range    Glucose-Capillary 216 (H) 65 - 99 mg/dL  Heparin level (unfractionated)     Status: None    Collection Time: 05/10/17  6:53 PM  Result Value Ref Range    Heparin Unfractionated 0.35 0.30 - 0.70 IU/mL      Comment:        IF HEPARIN RESULTS ARE BELOW EXPECTED VALUES, AND PATIENT DOSAGE HAS BEEN CONFIRMED, SUGGEST FOLLOW UP TESTING OF ANTITHROMBIN III LEVELS. Performed at [facility], [place] [address], [place], [place] [address]    Glucose, capillary     Status: Abnormal    Collection Time: 05/10/17  8:04 PM  Result Value Ref Range    Glucose-Capillary 223 (H) 65 - 99 mg/dL  Renal function panel     Status: Abnormal    Collection Time: 05/11/17  7:02 AM  Result Value Ref Range    Sodium 136 135 - 145 mmol/L    Potassium 3.4 (L) 3.5 - 5.1 mmol/L    Chloride 102 101 - 111 mmol/L    CO2 21 (L) 22 - 32 mmol/L    Glucose, Bld 149 (H) 65 - 99 mg/dL    BUN 55 (H) 6 - 20 mg/dL    Creatinine, Ser 6.41 (H) 0.61 - 1.24 mg/dL    Calcium 8.7 (L) 8.9 - 10.3 mg/dL    Phosphorus 5.1 (H) 2.5 - 4.6 mg/dL    Albumin 2.4 (L) 3.5 - 5.0 g/dL    GFR calc non Af Amer 10 (L) >60 mL/min    GFR calc Af Amer 11 (L) >60 mL/min      Comment: (NOTE) The eGFR has been calculated using the CKD EPI equation. This calculation has not been validated in all clinical situations. eGFR's persistently <60 mL/min signify possible Chronic Kidney Disease.      Anion gap 13 5 - 15      Comment: Performed at [facility] [address], [place], [place] [address]  CBC     Status: Abnormal    Collection Time: 05/11/17  7:02 AM  Result Value Ref Range    WBC 29.5 (H) 4.0 - 10.5 K/uL      Comment: REPEATED TO VERIFY    RBC 3.53 (L) 4.22 - 5.81 MIL/uL    Hemoglobin 9.8 (L) 13.0 - 17.0 g/dL      Comment: REPEATED TO VERIFY    HCT 29.4 (L) 39.0  - 52.0 %    MCV 83.3 78.0 - 100.0 fL    MCH 27.8 26.0 - 34.0 pg    MCHC 33.3 30.0 - 36.0 g/dL  RDW 19.1 (H) 11.5 - 15.5 %    Platelets 516 (H) 150 - 400 K/uL      Comment: REPEATED TO VERIFY Performed at [facility], [place] [address], [place], [place] [address]    Parathyroid hormone, intact (no Ca)     Status: Abnormal    Collection Time: 05/11/17  7:20 AM  Result Value Ref Range    PTH 230 (H) 15 - 65 pg/mL      Comment: (NOTE) Performed At: [facility] [place], [place] [id number] Rush Farmer MD [id number] Performed at [facility], [place] [address], [place], [place] [address]    Iron and TIBC     Status: Abnormal    Collection Time: 05/11/17  7:25 AM  Result Value Ref Range    Iron 44 (L) 45 - 182 ug/dL    TIBC 279 250 - 450 ug/dL    Saturation Ratios 16 (L) 17.9 - 39.5 %    UIBC 235 ug/dL      Comment: Performed at [facility], [place] [address] [place], [place] [address]  Glucose, capillary     Status: Abnormal    Collection Time: 05/11/17 12:22 PM  Result Value Ref Range    Glucose-Capillary 237 (H) 65 - 99 mg/dL  Glucose, capillary     Status: Abnormal    Collection Time: 05/11/17  4:28 PM  Result Value Ref Range    Glucose-Capillary 194 (H) 65 - 99 mg/dL  Glucose, capillary     Status: Abnormal    Collection Time: 05/11/17 11:04 PM  Result Value Ref Range    Glucose-Capillary 141 (H) 65 - 99 mg/dL  CBC     Status: Abnormal    Collection Time: 05/12/17  3:46 AM  Result Value Ref Range    WBC 30.1 (H) 4.0 - 10.5 K/uL    RBC 3.74 (L) 4.22 - 5.81 MIL/uL    Hemoglobin 10.4 (L) 13.0 - 17.0 g/dL    HCT 31.7 (L) 39.0 - 52.0 %    MCV 84.8 78.0 - 100.0 fL    MCH 27.8 26.0 - 34.0 pg    MCHC 32.8 30.0 - 36.0 g/dL    RDW 19.3 (H) 11.5 - 15.5 %    Platelets 499 (H) 150 - 400 K/uL      Comment: Performed at [facility], [place] [address], [place], [place] [address]  Renal function panel     Status: Abnormal    Collection  Time: 05/12/17  3:46 AM  Result Value Ref Range    Sodium 136 135 - 145 mmol/L    Potassium 3.6 3.5 - 5.1 mmol/L    Chloride 100 (L) 101 - 111 mmol/L    CO2 24 22 - 32 mmol/L    Glucose, Bld 103 (H) 65 - 99 mg/dL    BUN 38 (H) 6 - 20 mg/dL    Creatinine, Ser 4.80 (H) 0.61 - 1.24 mg/dL    Calcium 8.8 (L) 8.9 - 10.3 mg/dL    Phosphorus 4.0 2.5 - 4.6 mg/dL    Albumin 2.5 (L) 3.5 - 5.0 g/dL    GFR calc non Af Amer 13 (L) >60 mL/min    GFR calc Af Amer 16 (L) >60 mL/min      Comment: (NOTE) The eGFR has been calculated using the CKD EPI equation. This calculation has not been validated in all clinical situations. eGFR's persistently <60 mL/min signify possible Chronic Kidney Disease.  Anion gap 12 5 - 15      Comment: Performed at [facility] [address] [place], [place] [address]  Glucose, capillary     Status: Abnormal    Collection Time: 05/12/17  7:54 AM  Result Value Ref Range    Glucose-Capillary 100 (H) 65 - 99 mg/dL  Glucose, capillary     Status: Abnormal    Collection Time: 05/12/17 11:21 AM  Result Value Ref Range    Glucose-Capillary 203 (H) 65 - 99 mg/dL  Renal function panel     Status: Abnormal    Collection Time: 05/12/17 11:41 AM  Result Value Ref Range    Sodium 133 (L) 135 - 145 mmol/L    Potassium 4.1 3.5 - 5.1 mmol/L    Chloride 98 (L) 101 - 111 mmol/L    CO2 20 (L) 22 - 32 mmol/L    Glucose, Bld 164 (H) 65 - 99 mg/dL    BUN 45 (H) 6 - 20 mg/dL    Creatinine, Ser 5.22 (H) 0.61 - 1.24 mg/dL    Calcium 8.6 (L) 8.9 - 10.3 mg/dL    Phosphorus 4.6 2.5 - 4.6 mg/dL    Albumin 2.6 (L) 3.5 - 5.0 g/dL    GFR calc non Af Amer 12 (L) >60 mL/min    GFR calc Af Amer 14 (L) >60 mL/min      Comment: (NOTE) The eGFR has been calculated using the CKD EPI equation. This calculation has not been validated in all clinical situations. eGFR's persistently <60 mL/min signify possible Chronic Kidney Disease.      Anion gap 15 5 - 15      Comment: Performed  at [facility] [address], [place], [place] [address]      Imaging Results (Last 48 hours)  No results found.           Medical Problem List and Plan: 1.  Debility secondary to cardiorenal syndrome, synovitis  Left hand due to RA 2.  DVT Prophylaxis/Anticoagulation: Eliquis. Monitor for any bleeding episodes 3. Pain Management: Neurontin 300 mg daily, Robaxin as needed 4. Mood: Provide emotional support 5. Neuropsych: This patient is capable of making decisions on his own behalf. 6. Skin/Wound Care: Routine skin checks 7. Fluids/Electrolytes/Nutrition: Routine I&O's with follow-up chemistries 8. End-stage renal disease. Hemodialysis initiated Tuesday Thursday Saturday schedule. Patient with left AVF graft placed 05/10/2017 9. Acute on chronic anemia. Continue Aranesp. Follow-up CBC with dialysis 10. Diabetes mellitus with peripheral neuropathy. Latest hemoglobin A1c 9.2. Lantus insulin 25 units daily at bedtime, NovoLog 3 units 3 times a day. 5 diabetic teaching 11. Acute on chronic diastolic congestive heart failure. Monitor for any signs of fluid overload. Patient's weight is down approximately 100 pounds since admission after diuresis. Continue Coreg 6.25 mg BID  12. Rheumatoid arthritis. Prednisone 10 mg daily. Patient had been receiving Abatacept every Tuesday prior to admission. Add Voltaren gel Follow-up rheumatologist Dr.Ziokowska 13. Atrial fibrillation RVR. Status post cardioversion. Amiodarone 200 mg twice a day  2 more days THEN DECREASE to 200 mg daily 14. Leukocytosis. Felt to be related to prednisone that has been tapered. Patient remains afebrile ,     Post Admission Physician Evaluation: Functional deficits secondary  tcardiorenal syndrome, synovitis  Left hand due to RA 1. Patient is admitted to receive collaborative, interdisciplinary care between the physiatrist, rehab nursing staff, and therapy team. 2. Patient's level of medical complexity and  substantial therapy needs in context of that  medical necessity cannot be provided at a lesser intensity of care such as a SNF. 3. Patient has experienced substantial functional loss from his/her baseline which was documented above under the "Functional History" and "Functional Status" headings.  Judging by the patient's diagnosis, physical exam, and functional history, the patient has potential for functional progress which will result in measurable gains while on inpatient rehab.  These gains will be of substantial and practical use upon discharge  in facilitating mobility and self-care at the household level. 4. Physiatrist will provide 24 hour management of medical needs as well as oversight of the therapy plan/treatment and provide guidance as appropriate regarding the interaction of the two. 5. The Preadmission Screening has been reviewed and patient status is unchanged unless otherwise stated above. 6. 24 hour rehab nursing will assist with bladder management, bowel management, safety, skin/wound care, disease management, medication administration, pain management and patient education  and help integrate therapy concepts, techniques,education, etc. 7. PT will assess and treat for/with: pre gait, gait training, endurance , safety, equipment, neuromuscular re education.   Goals are: Sup/Mod I mobility. 8. OT will assess and treat for/with: ADLs, Cognitive perceptual skills, Neuromuscular re education, safety, endurance, equipment.   Goals are: Sup/Mod I ADL. Therapy may proceed with showering this patient. 9. SLP will assess and treat for/with: NA.  Goals are: NA. 10. Case Management and Social Worker will assess and treat for psychological issues and discharge planning. 11. Team conference will be held weekly to assess progress toward goals and to determine barriers to discharge. 12. Patient will receive at least 3 hours of therapy per day at least 5 days per week. 13. ELOS: 20-24d        14. Prognosis:  good         Charlett Blake M.D. [facility] FAAPM&R (Sports Med, Neuromuscular Med) Diplomate Am Board of Electrodiagnostic Med  Elizabeth Sauer 05/12/2017

## 2017-05-13 NOTE — Discharge Summary (Addendum)
Physician Discharge Summary  John Bailey IDP:[id number] DOB: 12-Apr-1970 DOA: 04/22/2017  PCP: [name], MD  Admit date: 04/22/2017 Discharge date: 05/13/2017  Admitted From: Home  Disposition:  CIR.   Recommendations for Outpatient Follow-up:  1. Follow up with PCP in 1-2 weeks 2. Please obtain BMP/CBC in one week 3. Please patient needs hematologist evaluation for leukocytosis.  4. Follow up peripheral smear results.  5. Monitor Hb or for any sign of bleeding, patient on Eliquis.     Discharge Condition: Stable.  CODE STATUS: full code.  Diet recommendation: Heart Healthy   Brief/Interim Summary: Brief Narrative:  HPI on 04/22/2017 by Dr. Quintella Baton This is a47 year old gentleman who presents with complaints ofweight gain, increasing edema and difficulty walking as a result. This is a patient with rheumatoid arthritis, diabetes mellitus, chronic fluid overload. He was started on prednisone approximately 9 weeks ago to treat his rheumatoid arthritis. At that time his Lasix was continued. He was maintained on prednisone for 6 weeks. His Lasix was restarted approximately 3 weeks ago. With his Lasix being held he has gained weight, he states approximately 15 pounds. Despite being restarted on his Lasix he is not lost any weight to the point now it has become difficult for him to walk. He saw his PCP today and was sent him to the ER. The patient denies any cough, wheeze orchest pains.He does report increased shortness of breath with activity. History provided by the patient. The ER the patient is also hypoxic with ambulation, his oxygen sats going down to 80% with ambulation.  Interim history  Admitted for Acute CHF. Was placed on IV Lasix however this was discontinued as patient developed worsening renal failure.  Nephrology consulted and appreciated, patient started dialysis. Transfer to CIR.   Assessment & Plan   Acute on chronic diastolic heart  failure -Echocardiogram showed an EF of 55-60% with severe LVH, dilated left atrium and trivial pericardial effusion.  Unable to assess diastolic dysfunction. -Patient was started on IV Lasix however this was discontinued due to poor urine output and worsening creatinine  -Nephrology was consulted and appreciated, patient started dialysis and continues to have HD almost daily- had dialyzed today and next HD will be on 2/25 -Vascular surgery consulted and appreciated, for permanent HD access placement- placed today 05/10/2017 -UNNA boots were removed  -weight down about 100lbs since admission  Anasarca -Secondary to volume overload  -continue management with dialysis  Rheumatoid arthritis with flare -Patient receives Abatacept every Tuesday -knee x-rays negative for knee effusion -Continue methocarbamol, gabapentin -Dr. Alfredia Ferguson, previous hospitalist discussed with Dr. [name] (patient's rheumatologist)- recommended prednisone with taper, starting at 60mg  -Continue to taper prednisone- will start 10mg  on 05/13/2017  Atrial fibrillation with RVR -Cardiology consulted and appreciated -Status post DCCV cardioversion -Patient transitioned to Coreg and oral amiodarone- continue amiodarone 200mg  BID x1week, then decrease to 200mg  daily -Currently in normal sinus rhythm -was on Heparin, transitioned to Eliquis. Discussed with Dr Deterdine, ok to continue eliquis for now with close follow up.    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 Donnetta Hutching, vascular surgery, consulted and appreciated, L AVF placed.  -Nephrology feels this is now ESRD, CLIP already done.    Hypertension -Blood pressure currently stable -Cardiology, Dr. Caryl Comes checking to rule out  amyloid given the  degree of LVH -Immunoelectrophoresis to check for amyloid however unable to collect 24-hour urine due to worsening renal failure  Hyperkalemia/hypokalemia -K 3.4 after repletion, will continue to replace -manage with HD  Leukocytosis -Possibly secondary to RA flare versus steroids -Previous hospitalist discussed with infectious disease, Dr. Drucilla Schmidt who believes WBC to be related to RA instead of infection -WBC currently 28. -consider hematologist consultation. Patient with elevated WBC in the past  -Will check peripheral smear.   Diabetes mellitus, type II -Continue Lantus, novolog 3u TID WC.  -A1c 9.2 (Dec 2018) -consider SSI.   Hyperphosphatemia  -continue dialysis management and monitor  Diarrhea -C. difficile checked and was unremarkable, GI pathogen panel negative -Continue to monitor Improved.   Physical deconditioning -PT/OT consulted and PT recommended SNF, OT recommended CIR -accepted to CIR today. Stable for transfer.     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)   Elevated troponin   Acute renal failure superimposed on stage 4 chronic kidney disease (HCC)   Rheumatoid arthritis (HCC)   Debility   Atrial fibrillation, chronic (HCC)   Hypoxia   Cardiorenal syndrome, stage 1-4 or unspecified chronic kidney disease, with heart failure [facility])    Discharge Instructions  Discharge Instructions    Diet - low sodium heart healthy   Complete by:  As directed    Increase activity slowly   Complete by:  As directed      Allergies as of 05/13/2017   No Known Allergies     Medication List    STOP taking these medications   Abatacept 125 MG/ML Soaj   celecoxib 100 MG  capsule Commonly known as:  CELEBREX   diltiazem 180 MG 24 hr capsule Commonly known as:  CARDIZEM CD   furosemide 40 MG tablet Commonly known as:  LASIX   insulin aspart 100 UNIT/ML FlexPen Commonly known as:  NOVOLOG FLEXPEN Replaced by:  insulin aspart 100 UNIT/ML injection   potassium chloride SA 20 MEQ tablet Commonly known as:  KLOR-CON M20   rivaroxaban 20 MG Tabs tablet Commonly known as:  XARELTO     TAKE these medications   acetaminophen 325 MG tablet Commonly known as:  TYLENOL Take 650 mg by mouth every 6 (six) hours as needed for mild pain.   albuterol 108 (90 Base) MCG/ACT inhaler Commonly known as:  PROVENTIL HFA;VENTOLIN HFA Inhale 1-2 puffs into the lungs every 6 (six) hours as needed for wheezing.   amiodarone 200 MG tablet Commonly known as:  PACERONE Take 1 tablet (200 mg total) by mouth 2 (two) times daily.   apixaban 5 MG Tabs tablet Commonly known as:  ELIQUIS Take 1 tablet (5 mg total) by mouth 2 (two) times daily.   calcitRIOL 0.5 MCG capsule Commonly known as:  ROCALTROL Take 1 capsule (0.5 mcg total) by mouth every other day.   carvedilol 6.25 MG tablet Commonly known as:  COREG Take 1 tablet (6.25 mg total) by mouth 2 (two) times daily with a meal. What changed:    medication strength  how much to take   colchicine 0.6 MG tablet Take 0.5 tablets (0.3 mg total) by mouth daily.   fluticasone 50 MCG/ACT nasal spray Commonly known as:  FLONASE Place 2 sprays into the nose daily.   gabapentin  300 MG capsule Commonly known as:  NEURONTIN Take 1 capsule (300 mg total) by mouth daily. Start taking on:  05/14/2017 What changed:  when to take this   insulin aspart 100 UNIT/ML injection Commonly known as:  novoLOG Inject 3 Units into the skin 3 (three) times daily with meals. Replaces:  insulin aspart 100 UNIT/ML FlexPen   Insulin Glargine 100 UNIT/ML Solostar Pen Commonly known as:  LANTUS Inject 25 Units into the skin daily at 10  pm.   Insulin Pen Needle 31G X 6 MM Misc 1 Device by Does not apply route 2 (two) times daily.   methocarbamol 500 MG tablet Commonly known as:  ROBAXIN Take 1 tablet (500 mg total) by mouth every 8 (eight) hours as needed for muscle spasms.   predniSONE 10 MG tablet Commonly known as:  DELTASONE Take 1 tablet (10 mg total) by mouth daily with breakfast. Start taking on:  05/14/2017      Follow-up Information    [name], MD .   Specialty:  Internal Medicine Contact information: [place] [address] [phone]        Pixie Casino, MD .   Specialty:  Cardiology Contact information: [address] [place] [address] [phone]        Rosetta Posner, MD In 6 weeks.   Specialties:  Vascular Surgery, Cardiology Why:  Office will call you to arrange your appt (sent) Contact information: [place] [address] [phone]          No Known Allergies  Consultations:  cardiology  Nephrology    Procedures/Studies: Dg Chest 2 View  Result Date: 04/22/2017 CLINICAL DATA:  Whole body edema.  Minor shortness of breath. EXAM: CHEST  2 VIEW COMPARISON:  03/06/2017 FINDINGS: Enlarged cardiac silhouette.  Mediastinal contours appear intact. There is no evidence of focal airspace consolidation, pleural effusion or pneumothorax. Mild interstitial pulmonary edema. Osseous structures are without acute abnormality. Soft tissues are grossly normal. IMPRESSION: Enlarged cardiac silhouette which may be due to heart failure or pericardial effusion. Interstitial pulmonary edema. Electronically Signed   By: Fidela Salisbury M.D.   On: 04/22/2017 19:07   Dg Knee 1-2 Views Left  Result Date: 04/26/2017 CLINICAL DATA:  Rheumatoid arthritis, knee pain, swelling, obesity EXAM: LEFT KNEE - 1-2 VIEW COMPARISON:  03/06/2017 FINDINGS: Left extremity subcutaneous edema noted. No acute osseous finding, fracture, or  malalignment. No effusion. No significant arthropathy or joint effusion. IMPRESSION: Left lower extremity subcutaneous edema. No acute osseous finding or significant arthropathy by plain radiography Electronically Signed   By: Jerilynn Mages.  Shick M.D.   On: 04/26/2017 12:12   Dg Knee 1-2 Views Right  Result Date: 04/26/2017 CLINICAL DATA:  Rheumatoid arthritis, bilateral knee pain, obesity, swelling EXAM: RIGHT KNEE - 1-2 VIEW COMPARISON:  09/05/2015 FINDINGS: Diffuse subcutaneous edema noted. No acute osseous finding, fracture, malalignment, or effusion. No significant arthropathy. Preserved joint spaces. IMPRESSION: Subcutaneous edema. No acute osseous finding or significant arthropathy by plain radiography Electronically Signed   By: Jerilynn Mages.  Shick M.D.   On: 04/26/2017 12:11   US Renal  Result Date: 04/27/2017 CLINICAL DATA:  Acute renal injury. EXAM: RENAL / URINARY TRACT ULTRASOUND COMPLETE COMPARISON:  Ultrasound 07/31/2015. FINDINGS: Right Kidney: The right kidney is not well seen due to patient's body habitus. No focal right renal abnormalities identified. No hydronephrosis. Renal cortical irregularity suggesting scarring. Length: 12.0 cm. Again limited exam due to patient's body habitus. Echogenicity  within normal limits. No hydronephrosis visualized. 3.2 cm hypoechoic area with increased through transmission, most likely a simple cyst lower pole left kidney. Again exam was limited due to patient's body habitus. Bladder: Appears normal for degree of bladder distention. IMPRESSION: Very limited exam due to patient's body habitus. Right renal cortical scarring appears to be present. Probable simple cyst left lower renal pole. No acute abnormality. No hydronephrosis. No bladder distention. Electronically Signed   By: Marcello Moores  Register   On: 04/27/2017 13:16   Ir Cyndy Freeze Guide Cv Line Right  Result Date: 04/27/2017 INDICATION: 47 year old with acute on chronic renal failure. Patient needs access for hemodialysis.  Patient is currently on anticoagulation, recent fever and very edematous. It would be difficult to place a tunneled dialysis catheter at this time. Plan for placement of a non tunneled dialysis catheter. EXAM: FLUOROSCOPIC AND ULTRASOUND GUIDED PLACEMENT OF A NON-TUNNELED DIALYSIS CATHETER Physician: Stephan Minister. Henn, MD MEDICATIONS: None ANESTHESIA/SEDATION: None FLUOROSCOPY TIME:  Fluoroscopy Time: 24 seconds, 9 mGy COMPLICATIONS: None immediate. PROCEDURE: Informed consent was obtained for catheter placement. The patient was placed supine on the interventional table. Ultrasound confirmed a patent right internal jugular vein. Ultrasound images were obtained for documentation. The right side of the neck was prepped and draped in a sterile fashion. The right side of the neck was anesthetized with 1% lidocaine. Maximal barrier sterile technique was utilized including caps, mask, sterile gowns, sterile gloves, sterile drape, hand hygiene and skin antiseptic. A small incision was made with #11 blade scalpel. A 21 gauge needle directed into the right internal jugular vein with ultrasound guidance. A micropuncture dilator set was placed. A 20 cm Mahurkar catheter was selected. The catheter was advanced over a wire and positioned in the superior vena cava. Fluoroscopic images were obtained for documentation. Both dialysis lumens were found to aspirate and flush well. The proper amount of heparin was flushed in both lumens. The central venous lumen was flushed with normal saline. Catheter was sutured to skin. FINDINGS: Catheter tip in the superior vena cava. IMPRESSION: Successful placement of a right jugular non-tunneled dialysis catheter using ultrasound and fluoroscopic guidance. Electronically Signed   By: Markus Daft M.D.   On: 04/27/2017 17:12   Ir US Guide Vasc Access Right  Result Date: 04/27/2017 INDICATION: 47 year old with acute on chronic renal failure. Patient needs access for hemodialysis. Patient is currently  on anticoagulation, recent fever and very edematous. It would be difficult to place a tunneled dialysis catheter at this time. Plan for placement of a non tunneled dialysis catheter. EXAM: FLUOROSCOPIC AND ULTRASOUND GUIDED PLACEMENT OF A NON-TUNNELED DIALYSIS CATHETER Physician: Stephan Minister. Henn, MD MEDICATIONS: None ANESTHESIA/SEDATION: None FLUOROSCOPY TIME:  Fluoroscopy Time: 24 seconds, 9 mGy COMPLICATIONS: None immediate. PROCEDURE: Informed consent was obtained for catheter placement. The patient was placed supine on the interventional table. Ultrasound confirmed a patent right internal jugular vein. Ultrasound images were obtained for documentation. The right side of the neck was prepped and draped in a sterile fashion. The right side of the neck was anesthetized with 1% lidocaine. Maximal barrier sterile technique was utilized including caps, mask, sterile gowns, sterile gloves, sterile drape, hand hygiene and skin antiseptic. A small incision was made with #11 blade scalpel. A 21 gauge needle directed into the right internal jugular vein with ultrasound guidance. A micropuncture dilator set was placed. A 20 cm Mahurkar catheter was selected. The catheter was advanced over a wire and positioned in the superior vena cava. Fluoroscopic images were obtained for documentation.  Both dialysis lumens were found to aspirate and flush well. The proper amount of heparin was flushed in both lumens. The central venous lumen was flushed with normal saline. Catheter was sutured to skin. FINDINGS: Catheter tip in the superior vena cava. IMPRESSION: Successful placement of a right jugular non-tunneled dialysis catheter using ultrasound and fluoroscopic guidance. Electronically Signed   By: Markus Daft M.D.   On: 04/27/2017 17:12   Dg Chest Port 1 View  Result Date: 05/04/2017 CLINICAL DATA:  Dialysis catheter placement. EXAM: PORTABLE CHEST 1 VIEW COMPARISON:  04/29/2017. FINDINGS: Right IJ dual-lumen catheter noted with  tip at the cavoatrial junction. Cardiomegaly with mild pulmonary vascular prominence. No focal infiltrate. No pleural effusion or pneumothorax. IMPRESSION: 1. Right IJ dual-lumen catheter noted with tip at the cavoatrial junction. 2.  Cardiomegaly with mild pulmonary venous congestion. Electronically Signed   By: Marcello Moores  Register   On: 05/04/2017 16:23   Dg Chest Port 1 View  Result Date: 04/29/2017 CLINICAL DATA:  Shortness of breath on exertion. Congestive heart failure. EXAM: PORTABLE CHEST 1 VIEW COMPARISON:  04/28/2017 FINDINGS: Right jugular central venous catheter remains in appropriate position. No pneumothorax visualized. Very low lung volumes are stable and there is no evidence of pulmonary consolidation or pleural effusion. Heart size is enlarged but also unchanged. IMPRESSION: Stable very low lung volumes, which limits evaluation. No evidence of pulmonary consolidation. Electronically Signed   By: Earle Gell M.D.   On: 04/29/2017 07:53   Dg Chest Port 1 View  Result Date: 04/28/2017 CLINICAL DATA:  Status post right central line placement. Shortness of breath. Chronic CHF, morbid obesity. EXAM: PORTABLE CHEST 1 VIEW COMPARISON:  Chest x-ray of February 7th 2019 FINDINGS: There has been interval placement of a right internal jugular venous catheter. The tip of the catheter projects over the proximal SVC. There is no postplacement pneumothorax. The cardiac silhouette remains enlarged. The pulmonary vascularity remains engorged and the interstitial markings increased. The left hemidiaphragm is obscured. IMPRESSION: No postprocedure complication following right internal jugular venous catheter placement. Cardiomegaly with mild pulmonary interstitial edema consistent with CHF. Electronically Signed   By: David  Martinique M.D.   On: 04/28/2017 12:53   Dg Fluoro Guide Cv Line-no Report  Result Date: 05/04/2017 Fluoroscopy was utilized by the requesting physician.  No radiographic interpretation.       Subjective: He is feeling well, denies cough, chest pain. Diarrhea has improved. Denies abdominal pain.   Discharge Exam: Vitals:   05/13/17 0700 05/13/17 0830  BP: 123/80   Pulse: 88   Resp: (!) 28 18  Temp:    SpO2:     Vitals:   05/13/17 0001 05/13/17 0400 05/13/17 0700 05/13/17 0830  BP: 126/86 116/78 123/80   Pulse: 78 80 88   Resp: (!) 22 19 (!) 28 18  Temp: 99.1 F (37.3 C) 98.7 F (37.1 C)    TempSrc: Axillary Oral Oral   SpO2: 94% 96%    Weight:  133.7 kg (294 lb 12.1 oz)    Height:        General: Pt is alert, awake, not in acute distress Cardiovascular: RRR, S1/S2 +, no rubs, no gallops Respiratory: CTA bilaterally, no wheezing, no rhonchi Abdominal: Soft, NT, ND, bowel sounds + Extremities: no edema, no cyanosis    The results of significant diagnostics from this hospitalization (including imaging, microbiology, ancillary and laboratory) are listed below for reference.     Microbiology: Recent Results (from the past 240 hour(s))  Surgical pcr  screen     Status: None   Collection Time: 05/04/17  6:53 AM  Result Value Ref Range Status   MRSA, PCR NEGATIVE NEGATIVE Final   Staphylococcus aureus NEGATIVE NEGATIVE Final    Comment: (NOTE) The Xpert SA Assay (FDA approved for NASAL specimens in patients 82 years of age and older), is one component of a comprehensive surveillance program. It is not intended to diagnose infection nor to guide or monitor treatment. Performed at [facility], [place] [address], [place], [place] [address]      Labs: BNP (last 3 results) Recent Labs    03/06/17 1800 04/22/17 1734 04/23/17 0736  BNP 791.5* 531.3* 546.2*   Basic Metabolic Panel: Recent Labs  Lab 05/09/17 0531 05/10/17 0943 05/11/17 0702 05/12/17 0346 05/12/17 1141  NA 135 134* 136 136 133*  K 2.8* 3.4* 3.4* 3.6 4.1  CL 99* 100* 102 100* 98*  CO2 23 21* 21* 24 20*  GLUCOSE 143* 208* 149* 103* 164*  BUN 22* 41* 55* 38* 45*   CREATININE 4.00* 5.32* 6.41* 4.80* 5.22*  CALCIUM 8.6* 8.7* 8.7* 8.8* 8.6*  MG 2.0  --   --   --   --   PHOS  --   --  5.1* 4.0 4.6   Liver Function Tests: Recent Labs  Lab 05/11/17 0702 05/12/17 0346 05/12/17 1141  ALBUMIN 2.4* 2.5* 2.6*   No results for input(s): LIPASE, AMYLASE in the last 168 hours. No results for input(s): AMMONIA in the last 168 hours. CBC: Recent Labs  Lab 05/09/17 0531 05/10/17 0943 05/11/17 0702 05/12/17 0346 05/13/17 0352  WBC 34.9* 33.5* 29.5* 30.1* 28.6*  NEUTROABS 29.7*  --   --   --   --   HGB 10.2* 9.7* 9.8* 10.4* 9.8*  HCT 30.5* 30.2* 29.4* 31.7* 29.6*  MCV 84.3 83.7 83.3 84.8 83.6  PLT 540* 540* 516* 499* 414*   Cardiac Enzymes: No results for input(s): CKTOTAL, CKMB, CKMBINDEX, TROPONINI in the last 168 hours. BNP: Invalid input(s): POCBNP CBG: Recent Labs  Lab 05/12/17 0754 05/12/17 1121 05/12/17 1605 05/12/17 2058 05/13/17 0727  GLUCAP 100* 203* 256* 180* 175*   D-Dimer No results for input(s): DDIMER in the last 72 hours. Hgb A1c No results for input(s): HGBA1C in the last 72 hours. Lipid Profile No results for input(s): CHOL, HDL, LDLCALC, TRIG, CHOLHDL, LDLDIRECT in the last 72 hours. Thyroid function studies No results for input(s): TSH, T4TOTAL, T3FREE, THYROIDAB in the last 72 hours.  Invalid input(s): FREET3 Anemia work up Recent Labs    05/11/17 0725  TIBC 279  IRON 44*   Urinalysis    Component Value Date/Time   COLORURINE YELLOW 04/25/2017 2057   APPEARANCEUR CLEAR 04/25/2017 2057   LABSPEC 1.010 04/25/2017 2057   PHURINE 5.0 04/25/2017 2057   GLUCOSEU NEGATIVE 04/25/2017 2057   HGBUR NEGATIVE 04/25/2017 2057   BILIRUBINUR NEGATIVE 04/25/2017 2057   KETONESUR NEGATIVE 04/25/2017 2057   PROTEINUR NEGATIVE 04/25/2017 2057   NITRITE NEGATIVE 04/25/2017 2057   LEUKOCYTESUR NEGATIVE 04/25/2017 2057   Sepsis Labs Invalid input(s): PROCALCITONIN,  WBC,  LACTICIDVEN Microbiology Recent Results  (from the past 240 hour(s))  Surgical pcr screen     Status: None   Collection Time: 05/04/17  6:53 AM  Result Value Ref Range Status   MRSA, PCR NEGATIVE NEGATIVE Final   Staphylococcus aureus NEGATIVE NEGATIVE Final    Comment: (NOTE) The Xpert SA Assay (FDA approved for NASAL specimens in patients 22 years of  age and older), is one component of a comprehensive surveillance program. It is not intended to diagnose infection nor to guide or monitor treatment. Performed at [facility], [place] [address] [place], [place] [address]      Time coordinating discharge: Over 30 minutes  SIGNED:   Elmarie Shiley, MD  [facility] 05/13/2017, 10:32 AM Pager [phone]  If 7PM-7AM, please contact night-coverage [email or web address] Password [email or web address]

## 2017-05-13 NOTE — PMR Pre-admission (Signed)
PMR Admission Coordinator Pre-Admission Assessment  Patient: John Bailey is an 47 y.o., male MRN: [id number] DOB: [birth year] Height: 6' (182.9 cm) Weight: 133.7 kg (294 lb 12.1 oz)            Insurance Information HMO:     PPO:      PCP:      IPA:      80/20:      OTHER: ACA Policy: Blue Select Silver Enhanced 200 PRIMARY: BCBS      Policy#: [id number]      Subscriber: Self CM Name: Elspeth Cho      Phone#: [phone]     Fax#: [phone] Pre-Cert#: [id number] for 05/13/17-05/27/17 with faxed updates and D/C plan to be faxed 05/26/17     Employer: Self-employed Benefits:  Phone #: [phone]     Name: Verified online at [email or web address] Eff. Date: 03/16/17     Deduct: $200      Out of Pocket Max: $600      Life Max: N/A CIR: 70%/30%      SNF: 70%/30% Outpatient: 30 PT/OT Rehabilitative, 30 PT/OT Habilitative      Co-Pay: $20 per visit  Home Health: Necessity, 70%      Co-Pay: 30% DME: 70%     Co-Pay: 20% Providers: In-network   SECONDARY: None        Medicaid Application Date:       Case Manager:  Disability Application Date:       Case Worker:   Emergency Contact Information Contact Information    Name Relation Home Work [place] Spouse [phone]     Scott,Erlene Relative [phone]       Current Medical History  Patient Admitting Diagnosis: Deconditioning secondary to cardiorenal syndrome  History of Present Illness: John Bailey a 47 y.o.right handed malewith history of rheumatoid arthritis maintained on chronic prednisone, peripheral vascular disease with lower extremity ischemic changes followed at the wound care center, diabetes mellitus, chronic fluid overload, atrial flutter with cardioversion maintained on Xarelto. Per chart review patient lives with spouse.Wife works during the day.One level home with a basement. Hegoes upstairs each night with 8-10 steps. Patient used a cane prior to admission.Heneeded assistance from sit to stand from  lift chair. He had noted a two-week decline in his energy level. Presented 04/22/2017 with increasing weight gain as well as shortness of breath and difficulty with ambulation. His oxygen saturations were in the 80s with ambulation. Noted Lasix recently adjusted. BUN 51, creatinine 2.65, troponin negative. Chest x-ray showed interstitial pulmonary edema. Renal ultrasound no hydronephrosis. Echocardiogram with ejection fraction of 60% with severe LVH. Patient placed on intravenous Lasix. Renal services consulted for elevating creatinine with dialysis initiated and underwent placement of right IJ dialysis tunneled catheter and has been transitioned to permanent dialysis on a Tuesday Thursday Saturday schedule and latest creatinine of 5.22 with left AVF placed per Dr. early. Cardiology services consulted from by atrial fibrillation and underwent cardioversion 04/30/2017 currently placed on intravenous heparin and later transitioned to Eliquis. Leukocytosis monitored while on prednisone that has been tapered. Physical and occupational therapy evaluations have continue to follow. Physical occupational Occupational therapy evaluations completed 05/05/2017 with recommendations of CIR. Patient was admitted for a comprehensive rehabilitation program 05/13/17.      Past Medical History  Past Medical History:  Diagnosis Date  . Atrial flutter with rapid ventricular response ([place]) 08/06/2015   a. s/p DCCV in 07/2015 with recurrent atrial fibrillation and DCCV in 12/2016. Initially successful but  noted to be back in atrial fibrillation within 2 weeks of DCCV.   [name] Chronic diastolic (congestive) heart failure (HCC)    a. EF 50-55% by echo in 06/2016.  . Diabetes ([place])   . Hypertension     Family History  family history includes Heart disease in his mother; Hyperlipidemia in his father; Hypertension in his father.  Prior Rehab/Hospitalizations:  Has the patient had major surgery during 100 days prior to admission?  No  Current Medications   Current Facility-Administered Medications:  .  0.9 %  sodium chloride infusion, 250 mL, Intravenous, PRN, Crosley, Debby, MD .  0.9 %  sodium chloride infusion, 250 mL, Intravenous, Continuous, Shirley Friar, PA-C .  0.9 %  sodium chloride infusion, , Intravenous, Continuous, Myrtie Soman, MD, Last Rate: 10 mL/hr at 05/04/17 1220 .  acetaminophen (TYLENOL) tablet 650 mg, 650 mg, Oral, Q4H PRN, Claria Dice, Debby, MD, 650 mg at 05/11/17 0814 .  amiodarone (PACERONE) tablet 200 mg, 200 mg, Oral, BID, Larey Dresser, MD, 200 mg at 05/13/17 0818 .  apixaban (ELIQUIS) tablet 5 mg, 5 mg, Oral, BID, Otilio Miu, RPH, 5 mg at 05/13/17 0818 .  calcitRIOL (ROCALTROL) capsule 0.5 mcg, 0.5 mcg, Oral, QODAY, Deterding, James, MD .  carvedilol (COREG) tablet 6.25 mg, 6.25 mg, Oral, BID WC, Deterding, James, MD .  Darbepoetin Alfa (ARANESP) injection 100 mcg, 100 mcg, Intravenous, Towanda Malkin, MD, 100 mcg at 05/11/17 [phone] .  fluticasone (FLONASE) 50 MCG/ACT nasal spray 2 spray, 2 spray, Each Nare, Daily, Crosley, Debby, MD, 2 spray at 05/13/17 0820 .  gabapentin (NEURONTIN) capsule 300 mg, 300 mg, Oral, Daily, Raiford Noble Latif, DO, 300 mg at 05/13/17 0818 .  hyoscyamine (LEVSIN SL) SL tablet 0.25 mg, 0.25 mg, Sublingual, Q6H PRN, Cristal Ford, DO, 0.25 mg at 05/07/17 1812 .  insulin aspart (novoLOG) injection 0-15 Units, 0-15 Units, Subcutaneous, TID WC, Crosley, Debby, MD, 3 Units at 05/13/17 0819 .  insulin aspart (novoLOG) injection 0-5 Units, 0-5 Units, Subcutaneous, QHS, Crosley, Debby, MD, 2 Units at 05/10/17 2250 .  insulin aspart (novoLOG) injection 3 Units, 3 Units, Subcutaneous, TID WC, Mikhail, Yutan, DO, 3 Units at 05/13/17 0818 .  insulin glargine (LANTUS) injection 25 Units, 25 Units, Subcutaneous, QHS, Mariel Aloe, MD, 25 Units at 05/12/17 2252 .  methocarbamol (ROBAXIN) tablet 500 mg, 500 mg, Oral, Q8H PRN, Mariel Aloe,  MD, 500 mg at 05/11/17 2317 .  multivitamin (RENA-VIT) tablet 1 tablet, 1 tablet, Oral, QHS, Deterding, Jeneen Rinks, MD, 1 tablet at 05/12/17 2251 .  ondansetron (ZOFRAN) injection 4 mg, 4 mg, Intravenous, Q6H PRN, Crosley, Debby, MD, 4 mg at 05/04/17 0118 .  oxyCODONE-acetaminophen (PERCOCET/ROXICET) 5-325 MG per tablet 1 tablet, 1 tablet, Oral, Q6H PRN, Rhyne, Samantha J, PA-C .  predniSONE (DELTASONE) tablet 10 mg, 10 mg, Oral, Q breakfast, Cristal Ford, DO, 10 mg at 05/13/17 5631 .  sodium chloride flush (NS) 0.9 % injection 10-40 mL, 10-40 mL, Intracatheter, PRN, Mariel Aloe, MD, 10 mL at 05/04/17 0556 .  sodium chloride flush (NS) 0.9 % injection 3 mL, 3 mL, Intravenous, Q12H, Crosley, Debby, MD, 3 mL at 05/12/17 2253 .  sodium chloride flush (NS) 0.9 % injection 3 mL, 3 mL, Intravenous, PRN, Crosley, Debby, MD .  sodium chloride flush (NS) 0.9 % injection 3 mL, 3 mL, Intravenous, Q12H, Shirley Friar, PA-C, 3 mL at 05/13/17 0819 .  sodium chloride flush (NS) 0.9 % injection 3 mL, 3  mL, Intravenous, PRN, Shirley Friar, PA-C  Patients Current Diet: Diet renal/carb modified with fluid restriction Diet-HS Snack? Nothing; Fluid restriction: 1200 mL Fluid; Room service appropriate? Yes; Fluid consistency: Thin Diet - low sodium heart healthy  Precautions / Restrictions Precautions Precautions: Fall Restrictions Weight Bearing Restrictions: No   Has the patient had 2 or more falls or a fall with injury in the past year?No, reports 1 fall  Prior Activity Level Community (5-7x/wk): Prior to admission patient was fully independent with use of a cane and lift chair.  He notifced weight gain and increased fatigue for about 2 weeks prior to his admission.  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    Home Assistive Devices / Equipment Home Assistive Devices/Equipment: Cane (specify quad or straight), Bedside commode/3-in-1 Home Equipment: Cane - single point  Prior Device Use:  Indicate devices/aids used by the patient prior to current illness, exacerbation or injury? Cane and lift chair   Prior Functional Level Prior Function Level of Independence: Needs assistance Gait / Transfers Assistance Needed: Required assistance for standing from lift chair as it is not functioning properly ADL's / Homemaking Assistance Needed: Required assist to rise out of his lift chair due to chair breaking. Walks up his stairs each night  Self Care: Did the patient need help bathing, dressing, using the toilet or eating? Independent  Indoor Mobility: Did the patient need assistance with walking from room to room (with or without device)? Independent  Stairs: Did the patient need assistance with internal or external stairs (with or without device)? Independent  Functional Cognition: Did the patient need help planning regular tasks such as shopping or remembering to take medications? Independent  Current Functional Level Cognition  Overall Cognitive Status: Within Functional Limits for tasks assessed Orientation Level: Oriented X4 General Comments: at times slow to respond but appears [facility]    Extremity Assessment (includes Sensation/Coordination)  Upper Extremity Assessment: RUE deficits/detail, LUE deficits/detail RUE Deficits / Details: Increased proximal strength as compared to distal. Able to demonstrate 3/5 strength at elbow and shoulder (limited ROM due to HD catheter) and 2/5 with grasp.  LUE Deficits / Details: Significant pain (and noted increased temperature) in L wrist significantly limiting LUE functional use. Pt able to complete minimal digit AROM today. 2+/5 strength grossly at elbow and shoulder (also may have been limited by wrist pain)  Lower Extremity Assessment: Defer to PT evaluation    ADLs  Overall ADL's : Needs assistance/impaired Eating/Feeding: Set up, With adaptive utensils, Sitting Eating/Feeding Details (indicate cue type and reason): Pt able to utilize  built-up handle for self-feeding with supervision this session.  Grooming: Maximal assistance, Sitting Upper Body Bathing: Maximal assistance, Sitting Lower Body Bathing: Total assistance, Sitting/lateral leans Upper Body Dressing : Maximal assistance, Sitting Lower Body Dressing: Total assistance Lower Body Dressing Details (indicate cue type and reason): totalA to doff/don socks this session  Toilet Transfer: Moderate assistance, +2 for physical assistance, Stand-pivot Toilet Transfer Details (indicate cue type and reason): Heavy mod today as attempted with RW. Poor ability to maintain grasp on RW with L hand and plan to attempt with walker splint next session.  Toileting- Clothing Manipulation and Hygiene: Total assistance, Sit to/from stand Toileting - Clothing Manipulation Details (indicate cue type and reason): +2 providing standing assist while additional assist provides peri-care after BM  Functional mobility during ADLs: Moderate assistance, +2 for physical assistance General ADL Comments: Pt concerned over B hand weakness as unable to grasp RW. Applied walker splint to  L side of RW and plan to address next session.    Mobility  Overal bed mobility: Needs Assistance Bed Mobility: Supine to Sit Rolling: Min guard Supine to sit: Min guard Sit to supine: Mod assist, +2 for safety/equipment General bed mobility comments: Min guard for safety. Verbal cues for hand placement on rails. Required increased time.     Transfers  Overall transfer level: Needs assistance Equipment used: Rolling walker (2 wheeled) Transfer via Lift Equipment: [employer] Transfers: Sit to/from Stand, [employer] Transfers Sit to Stand: Mod assist, +2 physical assistance, +2 safety/equipment Stand pivot transfers: Mod assist, +2 physical assistance, +2 safety/equipment Squat pivot transfers: +2 physical assistance, +2 safety/equipment General transfer comment: Heavy mod A +2 to power into standing. Educated  about appropriate use of RW and required cues for upright posture. Slight L knee buckling noted during transfer and required heavy mod A +2 using RW to transfer to chair.     Ambulation / Gait / Stairs / Wheelchair Mobility  Ambulation/Gait Ambulation/Gait assistance: (NT) General Gait Details: Able to take side steps along EOB and over to chair during stand pivot transfer. Verbal and manual assist required for RW use.     Posture / Balance Dynamic Sitting Balance Sitting balance - Comments: supervision for static sitting  Balance Overall balance assessment: Needs assistance Sitting-balance support: Feet supported, No upper extremity supported Sitting balance-Leahy Scale: Good Sitting balance - Comments: supervision for static sitting  Standing balance support: Bilateral upper extremity supported, During functional activity Standing balance-Leahy Scale: Poor Standing balance comment: Reliant on BUE support and external support.     Special needs/care consideration BiPAP/CPAP: No, but reports that he is awaiting an outpatient sleep study appointment CPM: No Continuous Drip IV: No Dialysis: Yes        Days: T,Th,Sat Life Vest: No Oxygen: Yes now, none PTA Special Bed: No Trach Size: No Wound Vac (area): No       Skin: He reports an old healed lesion on his left leg; Dry, cracking to bilateral lower extremities; Ecchymosis bilateral arms; Moisture associated skin damage to sacrum                              Bowel mgmt: Continent, last BM 05/11/17 Bladder mgmt: Now continent  Diabetic mgmt: Yes he checked his CBGs and took sliding scale insulin PTA     Previous Home Environment Living Arrangements: Spouse/significant other Available Help at Discharge: Family Type of Home: House Home Layout: (stays in basement downstairs, goes upstairs each night) Alternate Level Stairs-Rails: Left Alternate Level Stairs-Number of Steps: 2 flights (8-10 each) Home Access: Level entry Bathroom  Shower/Tub: Multimedia programmer: [place]: Yes Type of Home Care Services: Other (Comment)(wound care)  Discharge Living Setting Plans for Discharge Living Setting: Patient's home, Lives with (comment)(Spouse) Type of Home at Discharge: House Discharge Home Layout: Multi-level Alternate Level Stairs-Rails: Left Alternate Level Stairs-Number of Steps: (2 flights 8-10 steps) Discharge Home Access: Level entry Discharge Bathroom Shower/Tub: Walk-in shower Discharge Bathroom Toilet: Standard(but has a BSC to elevate) Discharge Bathroom Accessibility: Yes How Accessible: Accessible via walker Does the patient have any problems obtaining your medications?: Yes (Describe)(reports difficulty affording them)  Social/Family/Support Systems Patient Roles: Spouse, Parent Contact Information: Spouse: Juventino Slovak Anticipated Caregiver: Friends and family that patient will coordinate  Anticipated Caregiver's Contact Information: [name]: [phone] Ability/Limitations of Caregiver: She works days and they have 2 children 5 and 10  years of age Caregiver Availability: 24/7(that patient can coordinate ) Discharge Plan Discussed with Primary Caregiver: (discussed with patient ) Is Caregiver In Agreement with Plan?: (patient in agreement with plan ) Does Caregiver/Family have Issues with Lodging/Transportation while Pt is in Rehab?: No  Goals/Additional Needs Patient/Family Goal for Rehab: PT/OT: Supervision-Min A Expected length of stay: 20-24 days  Cultural Considerations: None Dietary Needs: Carb. Mod., Renal, fluid restrictions Equipment Needs: TBD Special Service Needs: HD: T,Th,Sat but on the waiting list for M,W,Fri seat Shiela in HD aware and monitoring  Pt/Family Agrees to Admission and willing to participate: Yes Program Orientation Provided & Reviewed with Pt/Caregiver Including Roles  & Responsibilities: Yes Additional Information Needs: See  above Information Needs to be Provided By: Team FYI  Barriers to Discharge: Weight, Hemodialysis, Inaccessible home environment  Decrease burden of Care through IP rehab admission: No  Possible need for SNF placement upon discharge: Not anticipated   Patient Condition: This patient's medical and functional status has changed since the consult dated: 05/06/17 in which the Rehabilitation Physician determined and documented that the patient's condition is appropriate for intensive rehabilitative care in an inpatient rehabilitation facility. See "History of Present Illness" (above) for medical update. Functional changes are: Mod A +2 with pivotal steps and transfer with rolling walker. Patient's medical and functional status update has been discussed with the Rehabilitation physician and patient remains appropriate for inpatient rehabilitation. Will admit to inpatient rehab today.  Preadmission Screen Completed By:  Gunnar Fusi, 05/13/2017 10:35 AM ______________________________________________________________________   Discussed status with Dr. Letta Pate on 05/13/17 at 1025 and received telephone approval for admission today.  Admission Coordinator:  Gunnar Fusi, time 1025/Date 05/13/17

## 2017-05-13 NOTE — Progress Notes (Signed)
Physical Medicine and Rehabilitation Consult Reason for Consult: Decreased functional mobility Referring Physician: Family medicine   HPI: John Bailey is a 47 y.o. right handed male with history of rheumatoid arthritis maintained on chronic prednisone, peripheral vascular disease with lower extremity ischemic changes followed at the wound care center, diabetes mellitus, chronic fluid overload, atrial flutter with cardioversion maintained on Xarelto. Per chart review patient lives with spouse. Wife works during the day. One level home with a basement. He goes upstairs each night with 8-10 steps. Patient used a cane prior to admission. He needed assistance from sit to stand from lift chair. Presented 04/22/2017 with increasing weight gain as well as shortness of breath and difficulty with ambulation. His oxygen saturations were in the 80s with ambulation. Noted Lasix recently adjusted. BUN 51, creatinine 2.65, troponin negative. Chest x-ray showed interstitial pulmonary edema. Renal ultrasound no hydronephrosis. Echocardiogram with ejection fraction of 60% with severe LVH. Patient placed on intravenous Lasix. Renal services consulted for elevating creatinine with dialysis initiated and underwent placement of right IJ dialysis tunneled catheter. Cardiology services consulted from by atrial fibrillation and underwent cardioversion 04/30/2017 currently with Xarelto on hold and continues with intravenous heparin with transition to Eliquis. Physical and occupational therapy evaluations have continue to follow. Occupational therapy evaluation 05/05/2017 with recommendations of CIR.  Patient had a 2-week decline in his level of energy.  Started sleeping all day.  He was going to wound care for his chronic lower extremity swelling and had Unna boots.  These were removed today Dry weight has been estimated at 280 pounds current weight is 320 pounds  Review of Systems  Constitutional: Negative for chills and  fever.  HENT: Negative for hearing loss.   Eyes: Negative for blurred vision and double vision.  Respiratory: Positive for shortness of breath.   Cardiovascular: Positive for palpitations and leg swelling. Negative for chest pain.  Gastrointestinal: Positive for constipation. Negative for nausea and vomiting.  Genitourinary: Negative for flank pain and hematuria.  Musculoskeletal: Positive for joint pain and myalgias.  Skin: Negative for rash.  Neurological: Negative for seizures.  All other systems reviewed and are negative.      Past Medical History:  Diagnosis Date  . Atrial flutter with rapid ventricular response ([place]) 08/06/2015   a. s/p DCCV in 07/2015 with recurrent atrial fibrillation and DCCV in 12/2016. Initially successful but noted to be back in atrial fibrillation within 2 weeks of DCCV.   [name] Chronic diastolic (congestive) heart failure ([facility])    a. EF 50-55% by echo in 06/2016.  . Diabetes ([place])   . Hypertension         Past Surgical History:  Procedure Laterality Date  . CARDIOVERSION N/A 08/01/2015   Procedure: CARDIOVERSION;  Surgeon: Josue Hector, MD;  Location: [facility] ENDOSCOPY;  Service: Cardiovascular;  Laterality: N/A;  . CARDIOVERSION N/A 12/18/2016   Procedure: CARDIOVERSION;  Surgeon: [name], MD;  Location: [facility] ENDOSCOPY;  Service: Cardiovascular;  Laterality: N/A;  . CARDIOVERSION N/A 04/30/2017   Procedure: CARDIOVERSION;  Surgeon: Larey Dresser, MD;  Location: [facility] ENDOSCOPY;  Service: Cardiovascular;  Laterality: N/A;  . CHOLECYSTECTOMY    . INSERTION OF DIALYSIS CATHETER Right 05/04/2017   Procedure: INSERTION OF TUNNELED  DIALYSIS CATHETER;  Surgeon: Conrad , MD;  Location: [facility];  Service: Vascular;  Laterality: Right;  . IR FLUORO GUIDE CV LINE RIGHT  04/27/2017  . IR US GUIDE VASC ACCESS RIGHT  04/27/2017  . TEE WITHOUT CARDIOVERSION N/A 08/01/2015  Procedure: TRANSESOPHAGEAL ECHOCARDIOGRAM (TEE);  Surgeon: Josue Hector,  MD;  Location: [facility] ENDOSCOPY;  Service: Cardiovascular;  Laterality: N/A;        Family History  Problem Relation Age of Onset  . Heart disease Mother   . Hyperlipidemia Father   . Hypertension Father    Social History:  reports that  has never smoked. he has never used smokeless tobacco. He reports that he does not drink alcohol or use drugs. Allergies: No Known Allergies       Medications Prior to Admission  Medication Sig Dispense Refill  . Abatacept 125 MG/ML SOAJ Inject 125 mg into the skin every 7 (seven) days. On Tuesday    . acetaminophen (TYLENOL) 325 MG tablet Take 650 mg by mouth every 6 (six) hours as needed for mild pain.    [name] albuterol (PROVENTIL HFA;VENTOLIN HFA) 108 (90 BASE) MCG/ACT inhaler Inhale 1-2 puffs into the lungs every 6 (six) hours as needed for wheezing. 1 Inhaler 3  . carvedilol (COREG) 12.5 MG tablet Take 1 tablet (12.5 mg total) by mouth 2 (two) times daily with a meal. 2 tablet 0  . celecoxib (CELEBREX) 100 MG capsule Take 100 mg by mouth daily.  0  . colchicine 0.6 MG tablet Take 0.5 tablets (0.3 mg total) by mouth daily. 30 tablet 0  . diltiazem (CARDIZEM CD) 180 MG 24 hr capsule Take 1 capsule (180 mg total) by mouth daily. 30 capsule 0  . fluticasone (FLONASE) 50 MCG/ACT nasal spray Place 2 sprays into the nose daily.     . furosemide (LASIX) 40 MG tablet Take 40 mg by mouth 2 (two) times daily.  3  . gabapentin (NEURONTIN) 300 MG capsule Take 1 capsule (300 mg total) by mouth 2 (two) times daily. (Patient taking differently: Take 300 mg by mouth 3 (three) times daily. ) 60 capsule 0  . insulin aspart (NOVOLOG FLEXPEN) 100 UNIT/ML FlexPen Inject 0-12 Units into the skin 3 (three) times daily as needed for high blood sugar. Sliding Scale <70, initiate hypoglycemia protocol, 70-100=0 units, 110-199= 3 units, 200-250=5 units, 251-300=7 units, 301-350=10 units, 351-400=12 units, >400=14 units. Call MD if >450 15 mL 11  . Insulin Glargine (LANTUS)  100 UNIT/ML Solostar Pen Inject 25 Units into the skin daily at 10 pm. 15 mL 11  . potassium chloride SA (KLOR-CON M20) 20 MEQ tablet Take 1 tablet (20 mEq total) by mouth daily. 90 tablet 3  . rivaroxaban (XARELTO) 20 MG TABS tablet take 1 tablet by mouth once daily WITH SUPPER (Patient taking differently: Take 20 mg by mouth daily with supper. ) 30 tablet 0  . Insulin Pen Needle 31G X 6 MM MISC 1 Device by Does not apply route 2 (two) times daily. 95 each 3    Home: Home Living Family/patient expects to be discharged to:: Private residence Living Arrangements: Spouse/significant other Available Help at Discharge: Family Type of Home: House Home Access: Level entry Home Layout: (stays in basement downstairs, goes upstairs each night) Alternate Level Stairs-Number of Steps: 2 flights (8-10 each) Alternate Level Stairs-Rails: Left Bathroom Shower/Tub: Multimedia programmer: Standard Home Equipment: Cane - single point  Functional History: Prior Function Level of Independence: Needs assistance Gait / Transfers Assistance Needed: Required assistance for standing from lift chair as it is not functioning properly ADL's / Homemaking Assistance Needed: Required assist to rise out of his lift chair due to chair breaking. Walks up his stairs each night Functional Status:  Mobility: Bed Mobility  Overal bed mobility: Needs Assistance Bed Mobility: Supine to Sit, Sit to Supine Rolling: Mod assist, +2 for safety/equipment Supine to sit: Max assist, +2 for physical assistance Sit to supine: Max assist, +2 for physical assistance(+3) General bed mobility comments: Pt requiring significant assistance for bed mobility but able to initiate tasks.  Transfers Overall transfer level: Needs assistance Transfer via Lift Equipment: [place] transfer comment: Discussed plan to attempt standing next session. Ambulation/Gait Ambulation/Gait assistance: (NT)  ADL: ADL Overall ADL's  : Needs assistance/impaired Eating/Feeding: Moderate assistance, Sitting Grooming: Maximal assistance, Sitting Upper Body Bathing: Maximal assistance, Sitting Lower Body Bathing: Total assistance, Sitting/lateral leans Upper Body Dressing : Maximal assistance, Sitting Lower Body Dressing: Total assistance, Sitting/lateral leans Toileting- Clothing Manipulation and Hygiene: Total assistance, Bed level General ADL Comments: Pt able to sit at EOB for strengthening activities in preparation for ADL. Able to reach with RUE to back of head.  Cognition: Cognition Overall Cognitive Status: Within Functional Limits for tasks assessed Orientation Level: Oriented X4 Cognition Arousal/Alertness: Awake/alert Behavior During Therapy: WFL for tasks assessed/performed Overall Cognitive Status: Within Functional Limits for tasks assessed General Comments: at times slow to respond but appears [facility]  Blood pressure (!) 147/89, pulse 69, temperature 97.8 F (36.6 C), temperature source Oral, resp. rate 20, height 6' (1.829 m), weight (!) 150.1 kg (330 lb 14.6 oz), SpO2 100 %. Physical Exam  Vitals reviewed. Constitutional: He is oriented to person, place, and time.  47 year old right-handed obese male  HENT:  Head: Normocephalic.  Eyes: EOM are normal.  Neck: Normal range of motion. Neck supple. No thyromegaly present.  Cardiovascular:  Cardiac rate controlled  Respiratory: Effort normal and breath sounds normal. No respiratory distress.  GI: Soft. Bowel sounds are normal. He exhibits no distension.  Neurological: He is alert and oriented to person, place, and time.  Skin:  Unna boots in place  Motor strength is 3- bilateral grip secondary to pain and joint stiffness.  Biceps triceps are 4- right deltoid 4 8 left deltoid 3- Lower extremity 3- hip flexion knee extension and ankle dorsiflexion bilaterally. 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 Does the need for close, 24 hr/day medical supervision in concert with the patient's rehab needs make it unreasonable for this patient to be served in a less intensive setting? Yes 2. Co-Morbidities requiring supervision/potential complications: Stasis dermatitis, morbid obesity BMI greater than 40, rheumatoid arthritis with multiple joint deformities 3. Due to bladder management, bowel management, safety, skin/wound care, disease management, medication administration, pain management and patient education, does the patient require 24 hr/day rehab nursing? Yes 4. Does the patient require coordinated care of a physician, rehab nurse, PT (1-2 hrs/day, 5 days/week) and OT (1-2 hrs/day, 5 days/week) to address physical and functional deficits in the context of the above medical diagnosis(es)? Yes Addressing deficits in the following areas: balance, endurance, locomotion, strength, transferring, bathing, dressing, feeding, grooming, toileting and psychosocial support 5. Can the patient actively participate in an intensive therapy program of at least 3 hrs of therapy per day at least 5 days per week? Not currently but should be able to do so in 4-5 days 6. The potential for patient to make measurable gains while on inpatient rehab is good 7. Anticipated  functional outcomes upon discharge from inpatient rehab are supervision and min assist  with PT, supervision and min assist with OT, n/a with SLP. 8. Estimated rehab length of stay to reach the above functional goals is: 20-24 days 9. Anticipated D/C setting: Home 10. Anticipated post D/C treatments: [place] therapy 11. Overall Rehab/Functional Prognosis: good and fair  RECOMMENDATIONS: This patient's  condition is appropriate for continued rehabilitative care in the following setting: CIR Patient has agreed to participate in recommended program. Yes Note that insurance prior authorization may be required for reimbursement for recommended care.  Comment: Currently not able to tolerate more intensive therapy.  Discussed with physical therapy should be ready early next week.  We discussed the importance of weight loss with the patient in terms of his long-term mobility issues.   Lavon Paganini Angiulli, PA-C 05/06/2017          Revision History                        Routing History

## 2017-05-13 NOTE — Progress Notes (Signed)
PMR Admission Coordinator Pre-Admission Assessment  Patient: John Bailey is an 47 y.o., male MRN: [id number] DOB: [birth year] Height: 6' (182.9 cm) Weight: 133.7 kg (294 lb 12.1 oz)                                                                                                                          Insurance Information HMO:     PPO:      PCP:      IPA:      80/20:      OTHER: ACA Policy: Blue Select Silver Enhanced 200 PRIMARY: BCBS      Policy#: [id number]      Subscriber: Self CM Name: Elspeth Cho      Phone#: [phone]     Fax#: [phone] Pre-Cert#: [id number] for 05/13/17-05/27/17 with faxed updates and D/C plan to be faxed 05/26/17     Employer: Self-employed Benefits:  Phone #: [phone]     Name: Verified online at [email or web address] Eff. Date: 03/16/17     Deduct: $200      Out of Pocket Max: $600      Life Max: N/A CIR: 70%/30%      SNF: 70%/30% Outpatient: 30 PT/OT Rehabilitative, 30 PT/OT Habilitative      Co-Pay: $20 per visit  Home Health: Necessity, 70%      Co-Pay: 30% DME: 70%     Co-Pay: 20% Providers: In-network   SECONDARY: None        Medicaid Application Date:       Case Manager:  Disability Application Date:       Case Worker:   Emergency Contact Information        Contact Information    Name Relation Home Work Anchor Spouse [phone]     Scott,Erlene Relative [phone]       Current Medical History  Patient Admitting Diagnosis: Deconditioning secondary to cardiorenal syndrome  History of Present Illness: John Bailey a 47 y.o.right handed malewith history of rheumatoid arthritis maintained on chronic prednisone, peripheral vascular disease with lower extremity ischemic changes followed at the wound care center, diabetes mellitus, chronic fluid overload, atrial flutter with cardioversion maintained on Xarelto. Per chart review patient lives with spouse.Wife works during the day.One level home with a basement.  Hegoes upstairs each night with 8-10 steps. Patient used a cane prior to admission.Heneeded assistance from sit to stand from lift chair.He had noted a two-week decline in his energy level.Presented 04/22/2017 with increasing weight gain as well as shortness of breath and difficulty with ambulation. His oxygen saturations were in the 80s with ambulation. Noted Lasix recently adjusted. BUN 51, creatinine 2.65, troponin negative. Chest x-ray showed interstitial pulmonary edema. Renal ultrasound no hydronephrosis. Echocardiogram with ejection fraction of 60% with severe LVH. Patient placed on intravenous Lasix. Renal services consulted for elevating creatinine with dialysis initiated and underwent placement of right IJ dialysis tunneled catheterand has been transitioned to permanent dialysis on a Tuesday Thursday Saturday schedule  and latest creatinine of 5.22 with left AVF placed per Dr. early. Cardiology services consulted from by atrial fibrillation and underwent cardioversion 04/30/2017 currentlyplaced on intravenous heparin and later transitioned toEliquis.Leukocytosis monitored while on prednisone that has been tapered.Physical and occupational therapy evaluations have continue to follow. Physical occupationalOccupational therapy evaluationscompleted02/20/2019 with recommendations of CIR. Patient was admitted for a comprehensive rehabilitation program 05/13/17.  Past Medical History      Past Medical History:  Diagnosis Date  . Atrial flutter with rapid ventricular response ([place]) 08/06/2015   a. s/p DCCV in 07/2015 with recurrent atrial fibrillation and DCCV in 12/2016. Initially successful but noted to be back in atrial fibrillation within 2 weeks of DCCV.   [name] Chronic diastolic (congestive) heart failure (HCC)    a. EF 50-55% by echo in 06/2016.  . Diabetes ([place])   . Hypertension     Family History  family history includes Heart disease in his mother; Hyperlipidemia in his  father; Hypertension in his father.  Prior Rehab/Hospitalizations:  Has the patient had major surgery during 100 days prior to admission? No  Current Medications   Current Facility-Administered Medications:  .  0.9 %  sodium chloride infusion, 250 mL, Intravenous, PRN, John Bailey, Debby, John Bailey .  0.9 %  sodium chloride infusion, 250 mL, Intravenous, Continuous, John Friar, John Bailey .  0.9 %  sodium chloride infusion, , Intravenous, Continuous, John Soman, John Bailey, Last Rate: 10 mL/hr at 05/04/17 1220 .  acetaminophen (TYLENOL) tablet 650 mg, 650 mg, Oral, Q4H PRN, John Bailey, Debby, John Bailey, 650 mg at 05/11/17 4128 .  amiodarone (PACERONE) tablet 200 mg, 200 mg, Oral, BID, John Dresser, John Bailey, 200 mg at 05/13/17 0818 .  apixaban (ELIQUIS) tablet 5 mg, 5 mg, Oral, BID, John Bailey, John Bailey, 5 mg at 05/13/17 0818 .  calcitRIOL (ROCALTROL) capsule 0.5 mcg, 0.5 mcg, Oral, QODAY, John Bailey, James, John Bailey .  carvedilol (COREG) tablet 6.25 mg, 6.25 mg, Oral, BID WC, John Bailey, James, John Bailey .  Darbepoetin Alfa (ARANESP) injection 100 mcg, 100 mcg, Intravenous, Towanda Malkin, John Bailey, 100 mcg at 05/11/17 [phone] .  fluticasone (FLONASE) 50 MCG/ACT nasal spray 2 spray, 2 spray, Each Nare, Daily, John Bailey, Debby, John Bailey, 2 spray at 05/13/17 0820 .  gabapentin (NEURONTIN) capsule 300 mg, 300 mg, Oral, Daily, Raiford Noble Latif, DO, 300 mg at 05/13/17 0818 .  hyoscyamine (LEVSIN SL) SL tablet 0.25 mg, 0.25 mg, Sublingual, Q6H PRN, Cristal Ford, DO, 0.25 mg at 05/07/17 1812 .  insulin aspart (novoLOG) injection 0-15 Units, 0-15 Units, Subcutaneous, TID WC, John Bailey, Debby, John Bailey, 3 Units at 05/13/17 0819 .  insulin aspart (novoLOG) injection 0-5 Units, 0-5 Units, Subcutaneous, QHS, John Bailey, Debby, John Bailey, 2 Units at 05/10/17 2250 .  insulin aspart (novoLOG) injection 3 Units, 3 Units, Subcutaneous, TID WC, Mikhail, Wade, DO, 3 Units at 05/13/17 0818 .  insulin glargine (LANTUS) injection 25 Units, 25 Units,  Subcutaneous, QHS, Mariel Aloe, John Bailey, 25 Units at 05/12/17 2252 .  methocarbamol (ROBAXIN) tablet 500 mg, 500 mg, Oral, Q8H PRN, Mariel Aloe, John Bailey, 500 mg at 05/11/17 2317 .  multivitamin (RENA-VIT) tablet 1 tablet, 1 tablet, Oral, QHS, John Bailey, John Rinks, John Bailey, 1 tablet at 05/12/17 2251 .  ondansetron (ZOFRAN) injection 4 mg, 4 mg, Intravenous, Q6H PRN, John Bailey, Debby, John Bailey, 4 mg at 05/04/17 0118 .  oxyCODONE-acetaminophen (PERCOCET/ROXICET) 5-325 MG per tablet 1 tablet, 1 tablet, Oral, Q6H PRN, Rhyne, Samantha J, John Bailey .  predniSONE (DELTASONE) tablet 10 mg, 10 mg, Oral, Q breakfast, Ree Kida, [place],  DO, 10 mg at 05/13/17 0626 .  sodium chloride flush (NS) 0.9 % injection 10-40 mL, 10-40 mL, Intracatheter, PRN, Mariel Aloe, John Bailey, 10 mL at 05/04/17 0556 .  sodium chloride flush (NS) 0.9 % injection 3 mL, 3 mL, Intravenous, Q12H, John Bailey, Debby, John Bailey, 3 mL at 05/12/17 2253 .  sodium chloride flush (NS) 0.9 % injection 3 mL, 3 mL, Intravenous, PRN, John Bailey, Debby, John Bailey .  sodium chloride flush (NS) 0.9 % injection 3 mL, 3 mL, Intravenous, Q12H, John Friar, John Bailey, 3 mL at 05/13/17 0819 .  sodium chloride flush (NS) 0.9 % injection 3 mL, 3 mL, Intravenous, PRN, John Friar, John Bailey  Patients Current Diet: Diet renal/carb modified with fluid restriction Diet-HS Snack? Nothing; Fluid restriction: 1200 mL Fluid; Room service appropriate? Yes; Fluid consistency: Thin Diet - low sodium heart healthy  Precautions / Restrictions Precautions Precautions: Fall Restrictions Weight Bearing Restrictions: No   Has the patient had 2 or more falls or a fall with injury in the past year?No, reports 1 fall  Prior Activity Level Community (5-7x/wk): Prior to admission patient was fully independent with use of a cane and lift chair.  He notifced weight gain and increased fatigue for about 2 weeks prior to his admission.  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    Home Assistive Devices /  Equipment Home Assistive Devices/Equipment: Cane (specify quad or straight), Bedside commode/3-in-1 Home Equipment: Cane - single point  Prior Device Use: Indicate devices/aids used by the patient prior to current illness, exacerbation or injury? Cane and lift chair   Prior Functional Level Prior Function Level of Independence: Needs assistance Gait / Transfers Assistance Needed: Required assistance for standing from lift chair as it is not functioning properly ADL's / Homemaking Assistance Needed: Required assist to rise out of his lift chair due to chair breaking. Walks up his stairs each night  Self Care: Did the patient need help bathing, dressing, using the toilet or eating? Independent  Indoor Mobility: Did the patient need assistance with walking from room to room (with or without device)? Independent  Stairs: Did the patient need assistance with internal or external stairs (with or without device)? Independent  Functional Cognition: Did the patient need help planning regular tasks such as shopping or remembering to take medications? Independent  Current Functional Level Cognition  Overall Cognitive Status: Within Functional Limits for tasks assessed Orientation Level: Oriented X4 General Comments: at times slow to respond but appears [facility]    Extremity Assessment (includes Sensation/Coordination)  Upper Extremity Assessment: RUE deficits/detail, LUE deficits/detail RUE Deficits / Details: Increased proximal strength as compared to distal. Able to demonstrate 3/5 strength at elbow and shoulder (limited ROM due to HD catheter) and 2/5 with grasp.  LUE Deficits / Details: Significant pain (and noted increased temperature) in L wrist significantly limiting LUE functional use. Pt able to complete minimal digit AROM today. 2+/5 strength grossly at elbow and shoulder (also may have been limited by wrist pain)  Lower Extremity Assessment: Defer to PT evaluation    ADLs   Overall ADL's : Needs assistance/impaired Eating/Feeding: Set up, With adaptive utensils, Sitting Eating/Feeding Details (indicate cue type and reason): Pt able to utilize built-up handle for self-feeding with supervision this session.  Grooming: Maximal assistance, Sitting Upper Body Bathing: Maximal assistance, Sitting Lower Body Bathing: Total assistance, Sitting/lateral leans Upper Body Dressing : Maximal assistance, Sitting Lower Body Dressing: Total assistance Lower Body Dressing Details (indicate cue type and reason): totalA to doff/don socks  this session  Toilet Transfer: Moderate assistance, +2 for physical assistance, Stand-pivot Toilet Transfer Details (indicate cue type and reason): Heavy mod today as attempted with RW. Poor ability to maintain grasp on RW with L hand and plan to attempt with walker splint next session.  Toileting- Clothing Manipulation and Hygiene: Total assistance, Sit to/from stand Toileting - Clothing Manipulation Details (indicate cue type and reason): +2 providing standing assist while additional assist provides peri-care after BM  Functional mobility during ADLs: Moderate assistance, +2 for physical assistance General ADL Comments: Pt concerned over B hand weakness as unable to grasp RW. Applied walker splint to L side of RW and plan to address next session.    Mobility  Overal bed mobility: Needs Assistance Bed Mobility: Supine to Sit Rolling: Min guard Supine to sit: Min guard Sit to supine: Mod assist, +2 for safety/equipment General bed mobility comments: Min guard for safety. Verbal cues for hand placement on rails. Required increased time.     Transfers  Overall transfer level: Needs assistance Equipment used: Rolling walker (2 wheeled) Transfer via Lift Equipment: [employer] Transfers: Sit to/from Stand, [employer] Transfers Sit to Stand: Mod assist, +2 physical assistance, +2 safety/equipment Stand pivot transfers: Mod assist, +2 physical  assistance, +2 safety/equipment Squat pivot transfers: +2 physical assistance, +2 safety/equipment General transfer comment: Heavy mod A +2 to power into standing. Educated about appropriate use of RW and required cues for upright posture. Slight L knee buckling noted during transfer and required heavy mod A +2 using RW to transfer to chair.     Ambulation / Gait / Stairs / Wheelchair Mobility  Ambulation/Gait Ambulation/Gait assistance: (NT) General Gait Details: Able to take side steps along EOB and over to chair during stand pivot transfer. Verbal and manual assist required for RW use.     Posture / Balance Dynamic Sitting Balance Sitting balance - Comments: supervision for static sitting  Balance Overall balance assessment: Needs assistance Sitting-balance support: Feet supported, No upper extremity supported Sitting balance-Leahy Scale: Good Sitting balance - Comments: supervision for static sitting  Standing balance support: Bilateral upper extremity supported, During functional activity Standing balance-Leahy Scale: Poor Standing balance comment: Reliant on BUE support and external support.     Special needs/care consideration BiPAP/CPAP: No, but reports that he is awaiting an outpatient sleep study appointment CPM: No Continuous Drip IV: No Dialysis: Yes        Days: T,Th,Sat Life Vest: No Oxygen: Yes now, none PTA Special Bed: No Trach Size: No Wound Vac (area): No       Skin: He reports an old healed lesion on his left leg; Dry, cracking to bilateral lower extremities; Ecchymosis bilateral arms; Moisture associated skin damage to sacrum                              Bowel mgmt: Continent, last BM 05/11/17 Bladder mgmt: Now continent  Diabetic mgmt: Yes he checked his CBGs and took sliding scale insulin PTA     Previous Home Environment Living Arrangements: Spouse/significant other Available Help at Discharge: Family Type of Home: House Home Layout: (stays in  basement downstairs, goes upstairs each night) Alternate Level Stairs-Rails: Left Alternate Level Stairs-Number of Steps: 2 flights (8-10 each) Home Access: Level entry Bathroom Shower/Tub: Multimedia programmer: [place]: Yes Type of Home Care Services: Other (Comment)(wound care)  Discharge Living Setting Plans for Discharge Living Setting: Patient's home, Lives with (comment)(Spouse)  Type of Home at Discharge: House Discharge Home Layout: Multi-level Alternate Level Stairs-Rails: Left Alternate Level Stairs-Number of Steps: (2 flights 8-10 steps) Discharge Home Access: Level entry Discharge Bathroom Shower/Tub: Walk-in shower Discharge Bathroom Toilet: Standard(but has a BSC to elevate) Discharge Bathroom Accessibility: Yes How Accessible: Accessible via walker Does the patient have any problems obtaining your medications?: Yes (Describe)(reports difficulty affording them)  Social/Family/Support Systems Patient Roles: Spouse, Parent Contact Information: Spouse: Juventino Slovak Anticipated Caregiver: Friends and family that patient will coordinate  Anticipated Caregiver's Contact Information: [name]: [phone] Ability/Limitations of Caregiver: She works days and they have 2 children 7 and 65 years of age Caregiver Availability: 24/7(that patient can coordinate ) Discharge Plan Discussed with Primary Caregiver: (discussed with patient ) Is Caregiver In Agreement with Plan?: (patient in agreement with plan ) Does Caregiver/Family have Issues with Lodging/Transportation while Pt is in Rehab?: No  Goals/Additional Needs Patient/Family Goal for Rehab: PT/OT: Supervision-Min A Expected length of stay: 20-24 days  Cultural Considerations: None Dietary Needs: Carb. Mod., Renal, fluid restrictions Equipment Needs: TBD Special Service Needs: HD: T,Th,Sat but on the waiting list for M,W,Fri seat Shiela in HD aware and monitoring  Pt/Family Agrees to  Admission and willing to participate: Yes Program Orientation Provided & Reviewed with Pt/Caregiver Including Roles  & Responsibilities: Yes Additional Information Needs: See above Information Needs to be Provided By: Team FYI  Barriers to Discharge: Weight, Hemodialysis, Inaccessible home environment  Decrease burden of Care through IP rehab admission: No  Possible need for SNF placement upon discharge: Not anticipated   Patient Condition: This patient's medical and functional status has changed since the consult dated: 05/06/17 in which the Rehabilitation Physician determined and documented that the patient's condition is appropriate for intensive rehabilitative care in an inpatient rehabilitation facility. See "History of Present Illness" (above) for medical update. Functional changes are: Mod A +2 with pivotal steps and transfer with rolling walker. Patient's medical and functional status update has been discussed with the Rehabilitation physician and patient remains appropriate for inpatient rehabilitation. Will admit to inpatient rehab today.  Preadmission Screen Completed By:  Gunnar Fusi, 05/13/2017 10:35 AM ______________________________________________________________________   Discussed status with Dr. Letta Pate on 05/13/17 at 1025 and received telephone approval for admission today.  Admission Coordinator:  Gunnar Fusi, time 1025/Date 05/13/17             Cosigned by: Charlett Blake, John Bailey at 05/13/2017 1:47 PM  Revision History

## 2017-05-13 NOTE — Progress Notes (Signed)
Patient ID: John Bailey, male   DOB: 01-07-1971, 47 y.o.   MRN: [id number] Patient admitted to 4M08 via bed, escorted by nursing staff.  Patient oriented to unit, verbalized understanding of rehab process, specifically fall prevention policy.  Patient in pain and reluctant to move.  Has multiple skin issues, primarily shearing injuries to buttocks, MASD to groin, buttocks, and healing diabetic ulcer on left lower leg.  Brita Romp, RN

## 2017-05-13 NOTE — Progress Notes (Signed)
Subjective: Interval History: has complaints wants to progress more rapidly.  Objective: Vital signs in last 24 hours: Temp:  [98.7 F (37.1 C)-99.5 F (37.5 C)] 98.7 F (37.1 C) (02/28 0400) Pulse Rate:  [78-88] 88 (02/28 0700) Resp:  [18-28] 18 (02/28 0830) BP: (116-126)/(68-86) 123/80 (02/28 0700) SpO2:  [92 %-98 %] 96 % (02/28 0400) Weight:  [133.7 kg (294 lb 12.1 oz)] 133.7 kg (294 lb 12.1 oz) (02/28 0400) Weight change: 4.3 kg (9 lb 7.7 oz)  Intake/Output from previous day: 02/27 0701 - 02/28 0700 In: 720 [P.O.:720] Out: -  Intake/Output this shift: No intake/output data recorded.  General appearance: alert, cooperative, no distress and morbidly obese Resp: diminished breath sounds bilaterally and rales bibasilar Chest wall: RIJ cath Cardio: S1, S2 normal and systolic murmur: holosystolic 2/6, buzzing at apex GI: obese, pos bs, soft, liver down 6 cm Extremities: edema 2+ and AVF LLA  Lab Results: Recent Labs    05/12/17 0346 05/13/17 0352  WBC 30.1* 28.6*  HGB 10.4* 9.8*  HCT 31.7* 29.6*  PLT 499* 414*   BMET:  Recent Labs    05/12/17 0346 05/12/17 1141  NA 136 133*  K 3.6 4.1  CL 100* 98*  CO2 24 20*  GLUCOSE 103* 164*  BUN 38* 45*  CREATININE 4.80* 5.22*  CALCIUM 8.8* 8.6*   Recent Labs    05/10/17 0943 05/11/17 0720  PTH 83* 230*   Iron Studies:  Recent Labs    05/11/17 0725  IRON 44*  TIBC 279    Studies/Results: No results found.  I have reviewed the patient's current medications.  Assessment/Plan: 1 ESRD for HD. Vol xs yet 2 HTN lower meds as is better 3 DM controlled 4 Anemai esa/Fe 5 Afib NSR 6 Massive obesity 7 HPTH vit D  8 RA P  HD, REhab, esa, add vit D, DM control, lower Coreg   LOS: 21 days   Krissia Schreier 05/13/2017,9:27 AM

## 2017-05-13 NOTE — Telephone Encounter (Signed)
Called and notified patient that his insurance company has denied a in-lab sleep study. A  HST has been scheduled on March 15th @ 11:45. He should go to [facility] to meet with the RT. Patient states that he is currently in the hospital and requests for information get mailed to him. I informed him he should be receiving a registration packet from the sleep center. If he does not he was told to call back to inform me of this. Patient voiced verbal understanding.

## 2017-05-13 NOTE — Progress Notes (Signed)
Inpatient Rehabilitation  I have insurance authorization as well as an IP Rehab bed available to offer.  I await acute medical clearance prior to hopeful IP Rehab admission today.  Plan to update team when I know.  Call if questions.    Carmelia Roller., CCC/SLP Admission Coordinator  [facility]  Cell [phone]

## 2017-05-14 ENCOUNTER — Inpatient Hospital Stay (HOSPITAL_COMMUNITY): Payer: BLUE CROSS/BLUE SHIELD

## 2017-05-14 ENCOUNTER — Inpatient Hospital Stay (HOSPITAL_COMMUNITY): Payer: BLUE CROSS/BLUE SHIELD | Admitting: Physical Therapy

## 2017-05-14 DIAGNOSIS — N186 End stage renal disease: Secondary | ICD-10-CM

## 2017-05-14 DIAGNOSIS — D62 Acute posthemorrhagic anemia: Secondary | ICD-10-CM

## 2017-05-14 DIAGNOSIS — D72829 Elevated white blood cell count, unspecified: Secondary | ICD-10-CM

## 2017-05-14 DIAGNOSIS — I5033 Acute on chronic diastolic (congestive) heart failure: Secondary | ICD-10-CM

## 2017-05-14 DIAGNOSIS — R52 Pain, unspecified: Secondary | ICD-10-CM

## 2017-05-14 DIAGNOSIS — D638 Anemia in other chronic diseases classified elsewhere: Secondary | ICD-10-CM

## 2017-05-14 DIAGNOSIS — Z992 Dependence on renal dialysis: Secondary | ICD-10-CM

## 2017-05-14 DIAGNOSIS — E1142 Type 2 diabetes mellitus with diabetic polyneuropathy: Secondary | ICD-10-CM

## 2017-05-14 DIAGNOSIS — I480 Paroxysmal atrial fibrillation: Secondary | ICD-10-CM

## 2017-05-14 LAB — GLUCOSE, CAPILLARY
GLUCOSE-CAPILLARY: 167 mg/dL — AB (ref 65–99)
GLUCOSE-CAPILLARY: 212 mg/dL — AB (ref 65–99)
Glucose-Capillary: 248 mg/dL — ABNORMAL HIGH (ref 65–99)
Glucose-Capillary: 201 mg/dL — ABNORMAL HIGH (ref 65–99)

## 2017-05-14 MED ORDER — CARVEDILOL 3.125 MG PO TABS
3.1250 mg | ORAL_TABLET | Freq: Two times a day (BID) | ORAL | Status: DC
  Administered 2017-05-14 – 2017-05-16 (×4): 3.125 mg via ORAL
  Filled 2017-05-14 (×4): qty 1

## 2017-05-14 NOTE — Evaluation (Signed)
Occupational Therapy Assessment and Plan  Patient Details  Name: John Bailey MRN: [id number] Date of Birth: [birth year]  OT Diagnosis: abnormal posture, acute pain, muscle weakness (generalized), pain in joint and coodination disorder Rehab Potential: Rehab Potential (ACUTE ONLY): Good ELOS: 21-24 days   815-930 75 min for session 1 1050-1130 40 min for session 2 Problem List:  Patient Active Problem List   Diagnosis Date Noted  . Cardiorenal syndrome with renal failure   . Cardiorenal syndrome, stage 1-4 or unspecified chronic kidney disease, with heart failure ([place]) 05/06/2017  . Atrial fibrillation, chronic ([place]) 04/22/2017  . Hypoxia 04/22/2017  . Excessive daytime sleepiness 04/02/2017  . Debility 04/02/2017  . Bradycardia 03/28/2017  . CKD (chronic kidney disease) stage 4, GFR 15-29 ml/min (HCC) 03/28/2017  . Pseudogout 03/09/2017  . Rheumatoid arthritis ([place]) 03/09/2017  . Infected ulcer of skin, with fat layer exposed ([place]) 03/09/2017  . Normocytic anemia 03/06/2017  . Elevated troponin 03/06/2017  . Acute renal failure superimposed on stage 4 chronic kidney disease ([place]) 03/06/2017  . Essential hypertension 12/29/2016  . Persistent atrial fibrillation ([place])   . Left leg cellulitis   . Atrial fibrillation with RVR ([place]) 12/02/2016  . Sepsis ([place]) 12/02/2016  . Acute on chronic congestive heart failure ([place])   . Acute on chronic renal insufficiency   . Chronic diastolic CHF (congestive heart failure) ([place]) 03/20/2016  . Nonischemic cardiomyopathy ([place]) 11/11/2015  . Long term (current) use of anticoagulants [Z79.01] 08/14/2015  . Insulin-dependent diabetes mellitus with renal complications ([place]) 18/29/9371  . Atrial flutter with rapid ventricular response ([place]) 08/06/2015  . Hyponatremia 07/30/2015  . Acute kidney injury ([place]) 07/30/2015  . Hyperkalemia 07/30/2015  . Acute on chronic diastolic CHF (congestive heart failure) ([place]) 07/30/2015  . Morbid obesity  ([place]) 07/30/2015  . Paroxysmal atrial fibrillation ([place]) 07/30/2015  . 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 Hypertensive heart disease with heart failure ([place]) 07/14/2012  . Dyspnea 07/14/2012  . Hyperlipidemia 07/14/2012    Past Medical History:  Past Medical History:  Diagnosis Date  . Atrial flutter with rapid ventricular response ([place]) 08/06/2015   a. s/p DCCV in 07/2015 with recurrent atrial fibrillation and DCCV in 12/2016. Initially successful but noted to be back in atrial fibrillation within 2 weeks of DCCV.   [name] Chronic diastolic (congestive) heart failure (HCC)    a. EF 50-55% by echo in 06/2016.  [name] Chronic kidney disease   . Diabetes ([place])   . Dysrhythmia    Aflutter  . Hypertension   . Peripheral vascular disease ([place])   . Rheumatoid arthritis [facility])    Past Surgical History:  Past Surgical History:  Procedure Laterality Date  . AV FISTULA PLACEMENT Left 05/10/2017   Procedure: CREATION of Left Radicephalic Fistula;  Surgeon: Rosetta Posner, MD;  Location: [facility];  Service: Vascular;  Laterality: Left;  . CARDIOVERSION N/A 08/01/2015   Procedure: CARDIOVERSION;  Surgeon: Josue Hector, MD;  Location: [facility] ENDOSCOPY;  Service: Cardiovascular;  Laterality: N/A;  . CARDIOVERSION N/A 12/18/2016   Procedure: CARDIOVERSION;  Surgeon: [name], MD;  Location: [facility] ENDOSCOPY;  Service: Cardiovascular;  Laterality: N/A;  . CARDIOVERSION N/A 04/30/2017   Procedure: CARDIOVERSION;  Surgeon: Larey Dresser, MD;  Location: [facility] ENDOSCOPY;  Service: Cardiovascular;  Laterality: N/A;  . CHOLECYSTECTOMY    . INSERTION OF DIALYSIS CATHETER Right 05/04/2017   Procedure: INSERTION OF TUNNELED  DIALYSIS CATHETER;  Surgeon: Conrad , MD;  Location: [facility]  OR;  Service: Vascular;  Laterality: Right;  . IR FLUORO GUIDE CV LINE RIGHT  04/27/2017  . IR US GUIDE VASC ACCESS RIGHT  04/27/2017  . TEE WITHOUT CARDIOVERSION N/A 08/01/2015   Procedure:  TRANSESOPHAGEAL ECHOCARDIOGRAM (TEE);  Surgeon: Josue Hector, MD;  Location: [facility] ENDOSCOPY;  Service: Cardiovascular;  Laterality: N/A;    Assessment & Plan Clinical Impression:  John Bailey is a 47 y.o. right handed male with history of rheumatoid arthritis maintained on chronic prednisone, peripheral vascular disease with lower extremity ischemic changes followed at the wound care center, diabetes mellitus, chronic fluid overload, atrial flutter with cardioversion maintained on Xarelto. Per chart review patient lives with spouse. Wife works during the day. One level home with a basement. He goes upstairs each night with 8-10 steps. Patient used a cane prior to admission. He needed assistance from sit to stand from lift chair. He had noted a two-week decline in his energy level. Presented 04/22/2017 with increasing weight gain as well as shortness of breath and difficulty with ambulation. His oxygen saturations were in the 80s with ambulation. Noted Lasix recently adjusted. BUN 51, creatinine 2.65, troponin negative. Chest x-ray showed interstitial pulmonary edema. Renal ultrasound no hydronephrosis. Echocardiogram with ejection fraction of 60% with severe LVH. Patient placed on intravenous Lasix. Renal services consulted for elevating creatinine with dialysis initiated and underwent placement of right IJ dialysis tunneled catheter and has been transitioned to permanent dialysis on a Tuesday Thursday Saturday schedule and latest creatinine of 5.22 with left AVF placed per Dr. early. Cardiology services consulted from by atrial fibrillation and underwent cardioversion 04/30/2017 currently placed on intravenous heparin and later transitioned to Eliquis. Leukocytosis monitored while on prednisone that has been tapered.  .    Patient currently requires supervision- total with basic self-care skills secondary to muscle weakness and muscle joint tightness, decreased cardiorespiratoy endurance, unbalanced  muscle activation and decreased coordination and decreased standing balance, decreased postural control and decreased balance strategies.  Prior to hospitalization, patient could complete BADL with modified independent .  Patient will benefit from skilled intervention to decrease level of assist with basic self-care skills prior to discharge home with care partner.  Anticipate patient will require 24 hour supervision and minimal physical assistance and follow up home health.  OT - End of Session Activity Tolerance: Tolerates 10 - 20 min activity with multiple rests Endurance Deficit: Yes Endurance Deficit Description: tolerates seated activity wiht multiple prolonged rests OT Assessment Rehab Potential (ACUTE ONLY): Good OT Barriers to Discharge: Weight;Medication compliance;Other (comments)(pain) OT Patient demonstrates impairments in the following area(s): Balance;Behavior;Edema;Endurance;Motor;Pain;Safety;Sensory;Vision;Skin Integrity OT Basic ADL's Functional Problem(s): Grooming;Bathing;Dressing;Toileting;Eating OT Transfers Functional Problem(s): Toilet;Tub/Shower OT Additional Impairment(s): None OT Plan OT Intensity: Minimum of 1-2 x/day, 45 to 90 minutes OT Frequency: 5 out of 7 days OT Duration/Estimated Length of Stay: 21-24 days OT Treatment/Interventions: Balance/vestibular training;Discharge planning;Pain management;Self Care/advanced ADL retraining;Therapeutic Activities;UE/LE Coordination activities;Disease mangement/prevention;Functional mobility training;Patient/family education;Skin care/wound managment;Therapeutic Exercise;Community reintegration;Psychosocial support;DME/adaptive equipment instruction;Neuromuscular re-education;UE/LE Strength taining/ROM;Wheelchair propulsion/positioning OT Self Feeding Anticipated Outcome(s): MOD I OT Basic Self-Care Anticipated Outcome(s): S OT Toileting Anticipated Outcome(s): S OT Bathroom Transfers Anticipated Outcome(s): S toilet; MIN  A shower transfer OT Recommendation Recommendations for Other Services: Neuropsych consult;Therapeutic Recreation consult Therapeutic Recreation Interventions: Stress management;Pet therapy Patient destination: Home Follow Up Recommendations: 24 hour supervision/assistance;Home health OT Equipment Recommended: Tub/shower seat;To be determined;3 in 1 bedside comode   Skilled Therapeutic Intervention 1:1. Pt reporting pain in B knees and RN applies Voltarin gel. Pt exceedingly sensitive  to any touch/movement of extremities. Pt supine>sitting EOB with MAX A and Vc for sequencing. Pt bathes seated at sit to stand level with A to wash buttocks, B feet and R arm d/t decreased ROM/strength in LUE. Pt with weak grasp and unable to open shower gel/squeeze water out of wash cloth without A. Digits 3 and 4 unable to flex upon command on LUE. Pt dons pull over shirt with supervision and Pants with total A sit to stand in stedy +2 from elevated surface with manual facilitation of weight shift forward and Vc for foot placement to improve body mechanics. Pt refuses ted hose d/t  Pain in BLE. Pt requires increased time and rest breaks between any movement. Exited session with pt seated in bed, call light in reach and all needs met.  Session 2: 1:1. 40 min 1050-1130. RN reporting pt cleared by PA to move after Xray of knee. Despite education, pt declines any OOB or EOB activity, however willing to complete bed level UB exercise and brush teeth. Pt brushes teeth with set up to obtain items/empty spit cup. OT applies red foam handle to improve grasp on toothbrush. Pt completes 1x15 shoulder elevation, forward/backward shoulder rolls to loosen up neck/shoulders with pt reporting improvement in tension. Pt completes 1x15 elbow flexion/extension, chest press and shoulder flexion/ext in mod ranges with demonstrational cues to improve BUE strength required for BADLs. Exited session wit pt seated in bed, call light in reach and  all needs met.   OT Evaluation Precautions/Restrictions  Precautions Precautions: Fall Restrictions Weight Bearing Restrictions: No General Chart Reviewed: Yes Vital Signs Therapy Vitals Temp: 99 F (37.2 C) Temp Source: Oral Pulse Rate: 82 Resp: 12 BP: (!) 100/57 Patient Position (if appropriate): Lying Oxygen Therapy SpO2: 96 % O2 Device: Room Air Pain Pain Assessment Pain Assessment: 0-10 Pain Score: 8  Pain Type: Acute pain Pain Location: Knee Pain Orientation: Right;Left Pain Descriptors / Indicators: Aching;Constant Pain Frequency: Constant Pain Onset: On-going Patients Stated Pain Goal: 2 Pain Intervention(s): Medication (See eMAR) Home Living/Prior Functioning Home Living Family/patient expects to be discharged to:: Private residence Living Arrangements: Spouse/significant other, Children Available Help at Discharge: Family, Available 24 hours/day Type of Home: House Home Access: Level entry Home Layout: (basement during day and climb stairs at night) Alternate Level Stairs-Number of Steps: 2 flights (8-10 each) Alternate Level Stairs-Rails: Left Bathroom Shower/Tub: Multimedia programmer: Standard Bathroom Accessibility: Yes IADL History Homemaking Responsibilities: Yes Meal Prep Responsibility: No Laundry Responsibility: No Cleaning Responsibility: No Bill Paying/Finance Responsibility: Primary Shopping Responsibility: No Child Care Responsibility: Primary Current License: Yes Mode of Transportation: Car Occupation: Full time employment Type of Occupation: Theme park manager Leisure and Hobbies: water aerobics Prior Function Level of Independence: Independent with basic ADLs, Other (comment)(A to get out of lift chair)  Able to Take Stairs?: Yes Driving: Yes ADL   Vision Baseline Vision/History: Wears glasses Wears Glasses: At all times Patient Visual Report: No change from baseline Vision Assessment?: No apparent visual  deficits Perception  Perception: Within Functional Limits Praxis Praxis: Intact Cognition Arousal/Alertness: Awake/alert Orientation Level: Person;Place;Situation Person: Oriented Place: Oriented Situation: Oriented Year: 2019 Month: March Day of Week: Correct Immediate Memory Recall: Blue;Sock;Bed Memory Recall: Sock;Blue;Bed Memory Recall Sock: Without Cue Memory Recall Blue: Without Cue Memory Recall Bed: With Cue Attention: Selective Selective Attention: Appears intact Sensation Sensation Light Touch: Appears Intact(reports tingling in L hand d/t RA) Coordination Gross Motor Movements are Fluid and Coordinated: No Fine Motor Movements are Fluid and Coordinated: No Motor  Motor Motor: (generalized weakness) Mobility  Transfers Transfers: Sit to Stand Sit to Stand: 1: +2 Total assist Sit to Stand: Patient Percentage: 20% Sit to Stand Details: Manual facilitation for placement;Verbal cues for safe use of DME/AE;Verbal cues for precautions/safety;Manual facilitation for weight shifting  Trunk/Postural Assessment  Cervical Assessment Cervical Assessment: Within Functional Limits Thoracic Assessment Thoracic Assessment: Exceptions to WFL(rounded shoulders) Lumbar Assessment Lumbar Assessment: Exceptions to WFL(post pelvic tilt preference) Postural Control Postural Control: Within Functional Limits  Balance Balance Balance Assessed: Yes Dynamic Sitting Balance Sitting balance - Comments: superivison for sitting EOB while washing Static Standing Balance Static Standing - Balance Support: Bilateral upper extremity supported Static Standing - Level of Assistance: 1: +2 Total assist(sit to stand in stedy) Static Standing - Comment/# of Minutes: VC for chest elevation/upright posture Extremity/Trunk Assessment RUE Assessment RUE Assessment: Exceptions to WFL(grossly 3/5 with shoulder movements; decreased gross finger flexion, ) LUE Assessment LUE Assessment:  Exceptions to WFL(2+/5 shoulder/elbow movment; decreased gross finger flexion)   See Function Navigator for Current Functional Status.   Refer to Care Plan for Long Term Goals  Recommendations for other services: Neuropsych and Therapeutic Recreation  Pet therapy and Stress management   Discharge Criteria: Patient will be discharged from OT if patient refuses treatment 3 consecutive times without medical reason, if treatment goals not met, if there is a change in medical status, if patient makes no progress towards goals or if patient is discharged from hospital.  The above assessment, treatment plan, treatment alternatives and goals were discussed and mutually agreed upon: by patient  Tonny Branch 05/14/2017, 8:37 AM

## 2017-05-14 NOTE — Progress Notes (Signed)
Ortho consulted to review MRI right knee and await plan of care

## 2017-05-14 NOTE — Evaluation (Addendum)
Physical Therapy Assessment and Plan  Patient Details  Name: John Bailey MRN: [id number] Date of Birth: 09-Jul-1970  PT Diagnosis: Edema and Muscle weakness Rehab Potential: Good ELOS: 21-24 days   Today's Date: 05/14/2017 PT Individual Time: 6644-0347 PT Individual Time Calculation (min): 30 min  and Today's Date: 05/14/2017 PT Missed Time: 43 Minutes Missed Time Reason: CT/MRI;Pain(pt missed 15 min as he was off unit, missed 30 min at end of session 2/2 pain)   Problem List:  Patient Active Problem List   Diagnosis Date Noted  . Pain   . Leukocytosis   . PAF (paroxysmal atrial fibrillation) ([place])   . Acute on chronic diastolic (congestive) heart failure ([place])   . Type 2 diabetes mellitus with peripheral neuropathy (HCC)   . Acute blood loss anemia   . Anemia of chronic disease   . ESRD on dialysis ([place])   . Cardiorenal syndrome with renal failure   . Cardiorenal syndrome, stage 1-4 or unspecified chronic kidney disease, with heart failure ([place]) 05/06/2017  . Atrial fibrillation, chronic ([place]) 04/22/2017  . Hypoxia 04/22/2017  . Excessive daytime sleepiness 04/02/2017  . Debility 04/02/2017  . Bradycardia 03/28/2017  . CKD (chronic kidney disease) stage 4, GFR 15-29 ml/min (HCC) 03/28/2017  . Pseudogout 03/09/2017  . Rheumatoid arthritis ([place]) 03/09/2017  . Infected ulcer of skin, with fat layer exposed ([place]) 03/09/2017  . Normocytic anemia 03/06/2017  . Elevated troponin 03/06/2017  . Acute renal failure superimposed on stage 4 chronic kidney disease ([place]) 03/06/2017  . Essential hypertension 12/29/2016  . Persistent atrial fibrillation ([place])   . Left leg cellulitis   . Atrial fibrillation with RVR ([place]) 12/02/2016  . Sepsis ([place]) 12/02/2016  . Acute on chronic congestive heart failure ([place])   . Acute on chronic renal insufficiency   . Chronic diastolic CHF (congestive heart failure) ([place]) 03/20/2016  . Nonischemic cardiomyopathy ([place]) 11/11/2015  . Long term  (current) use of anticoagulants [Z79.01] 08/14/2015  . Insulin-dependent diabetes mellitus with renal complications ([place]) 42/59/5638  . Atrial flutter with rapid ventricular response ([place]) 08/06/2015  . Hyponatremia 07/30/2015  . Acute kidney injury ([place]) 07/30/2015  . Hyperkalemia 07/30/2015  . Acute on chronic diastolic CHF (congestive heart failure) ([place]) 07/30/2015  . Morbid obesity ([place]) 07/30/2015  . Paroxysmal atrial fibrillation ([place]) 07/30/2015  . 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 Hypertensive heart disease with heart failure ([place]) 07/14/2012  . Dyspnea 07/14/2012  . Hyperlipidemia 07/14/2012    Past Medical History:  Past Medical History:  Diagnosis Date  . Atrial flutter with rapid ventricular response ([place]) 08/06/2015   a. s/p DCCV in 07/2015 with recurrent atrial fibrillation and DCCV in 12/2016. Initially successful but noted to be back in atrial fibrillation within 2 weeks of DCCV.   [name] Chronic diastolic (congestive) heart failure (HCC)    a. EF 50-55% by echo in 06/2016.  [name] Chronic kidney disease   . Diabetes ([place])   . Dysrhythmia    Aflutter  . Hypertension   . Peripheral vascular disease ([place])   . Rheumatoid arthritis [facility])    Past Surgical History:  Past Surgical History:  Procedure Laterality Date  . AV FISTULA PLACEMENT Left 05/10/2017   Procedure: CREATION of Left Radicephalic Fistula;  Surgeon: Rosetta Posner, MD;  Location: [place];  Service: Vascular;  Laterality: Left;  . CARDIOVERSION N/A 08/01/2015   Procedure: CARDIOVERSION;  Surgeon: Josue Hector, MD;  Location: [place];  Service: Cardiovascular;  Laterality:  N/A;  . CARDIOVERSION N/A 12/18/2016   Procedure: CARDIOVERSION;  Surgeon: [name], MD;  Location: [facility] ENDOSCOPY;  Service: Cardiovascular;  Laterality: N/A;  . CARDIOVERSION N/A 04/30/2017   Procedure: CARDIOVERSION;  Surgeon: Larey Dresser, MD;  Location: [facility] ENDOSCOPY;  Service:  Cardiovascular;  Laterality: N/A;  . CHOLECYSTECTOMY    . INSERTION OF DIALYSIS CATHETER Right 05/04/2017   Procedure: INSERTION OF TUNNELED  DIALYSIS CATHETER;  Surgeon: Conrad [place], MD;  Location: [place];  Service: Vascular;  Laterality: Right;  . IR FLUORO GUIDE CV LINE RIGHT  04/27/2017  . IR US GUIDE VASC ACCESS RIGHT  04/27/2017  . TEE WITHOUT CARDIOVERSION N/A 08/01/2015   Procedure: TRANSESOPHAGEAL ECHOCARDIOGRAM (TEE);  Surgeon: Josue Hector, MD;  Location: [facility] ENDOSCOPY;  Service: Cardiovascular;  Laterality: N/A;    Assessment & Plan Clinical Impression: Patient is a 47 y.o.right handed malewith history of rheumatoid arthritis maintained on chronic prednisone, peripheral vascular disease with lower extremity ischemic changes followed at the wound care center, diabetes mellitus, chronic fluid overload, atrial flutter with cardioversion maintained on Xarelto. Per chart review patient lives with spouse.Wife works during the day.One level home with a basement. Hegoes upstairs each night with 8-10 steps. Patient used a cane prior to admission.Heneeded assistance from sit to stand from lift chair.He had noted a two-week decline in his energy level.Presented 04/22/2017 with increasing weight gain as well as shortness of breath and difficulty with ambulation. His oxygen saturations were in the 80s with ambulation. Noted Lasix recently adjusted. BUN 51, creatinine 2.65, troponin negative. Chest x-ray showed interstitial pulmonary edema. Renal ultrasound no hydronephrosis. Echocardiogram with ejection fraction of 60% with severe LVH. Patient placed on intravenous Lasix. Renal services consulted for elevating creatinine with dialysis initiated and underwent placement of right IJ dialysis tunneled catheterand has been transitioned to permanent dialysis on a Tuesday Thursday Saturday schedule and latest creatinine of 5.22 with left AVF placed per Dr. early. Cardiology services consulted from by  atrial fibrillation and underwent cardioversion 04/30/2017 currentlyplaced on intravenous heparin and later transitioned toEliquis.Leukocytosis monitored while on prednisone that has been tapered.Physical and occupational therapy evaluations have continue to follow. Physical occupationalOccupational therapy evaluationscompleted02/20/2019 with recommendations of CIR. Patient transferred to CIR on 05/13/2017 .   Patient currently requires unable to assess 2/2 pain level with mobility secondary to muscle weakness and muscle joint tightness, decreased cardiorespiratoy endurance and pain.  Prior to hospitalization, patient was modified independent  with mobility and lived with Spouse, Family(wife works during the day but plans to arrange for 24/7 assist) in a House home.  Home access is  Level entry.  Patient will benefit from skilled PT intervention to maximize safe functional mobility, minimize fall risk and decrease caregiver burden for planned discharge home with 24 hour assist.  Anticipate patient will benefit from follow up [facility] at discharge.  PT - End of Session Activity Tolerance: Tolerates < 10 min activity, no significant change in vital signs Endurance Deficit: Yes PT Assessment Rehab Potential (ACUTE/IP ONLY): Good PT Barriers to Discharge: Inaccessible home environment;Home environment access/layout PT Barriers to Discharge Comments: split-level house PT Patient demonstrates impairments in the following area(s): Balance;Behavior;Endurance;Motor;Edema;Pain;Safety PT Transfers Functional Problem(s): Bed Mobility;Bed to Chair;Car;Furniture;Floor PT Locomotion Functional Problem(s): Ambulation;Wheelchair Mobility;Stairs PT Plan PT Intensity: Minimum of 1-2 x/day ,45 to 90 minutes PT Frequency: 5 out of 7 days PT Duration Estimated Length of Stay: 21-24 days PT Treatment/Interventions: Visual/perceptual remediation/compensation;Stair training;Disease management/prevention;Ambulation/gait  training;Pain management;Patient/family education;Therapeutic Activities;Wheelchair propulsion/positioning;DME/adaptive equipment instruction;Balance/vestibular training;Cognitive remediation/compensation;Psychosocial support;Therapeutic  Exercise;Community reintegration;Functional mobility training;Skin care/wound management;UE/LE Strength taining/ROM;UE/LE Coordination activities;Splinting/orthotics;Neuromuscular re-education;Discharge planning PT Transfers Anticipated Outcome(s): Min assist PT Locomotion Anticipated Outcome(s): Min assist gait short distances PT Recommendation Recommendations for Other Services: Neuropsych consult Follow Up Recommendations: Home health PT Patient destination: Home Equipment Recommended: To be determined  Skilled Therapeutic Intervention  Pt in supine and refused to participate in OOB activity 2/2 bilateral knee pain at rest and w/ all active movement. After encouragement, pt agreeable to attempt getting to EOB, however had significantly increased pain w/ AROM and strength testing and w/ rolling. Pt refusing to participate further. Discussed PLOF, d/c plan, and typical progression of debility impairments. Educated on d/c recommendations and goals. Discussed setting up daily small goals and performing active movement as much as he can tolerate. Educated pt on ankle pumps and quad sets to perform in bed for LE strengthening, he demo-ed correctly w/ mild increase in pain. Ended session in supine, call bell within reach and all needs met. Missed 15 min of skilled PT at beginning of session as pt was off unit, missed 30 min at end of session 2/2 pain.   PT Evaluation Precautions/Restrictions Precautions Precautions: Fall Restrictions Weight Bearing Restrictions: No General PT Amount of Missed Time (min): 45 Minutes PT Missed Treatment Reason: CT/MRI;Pain(pt missed 15 min as he was off unit, missed 30 min at end of session 2/2 pain) Vital SignsTherapy Vitals Temp:  98 F (36.7 C) Temp Source: Oral Pulse Rate: 83 Resp: 18 BP: (!) 100/57 Patient Position (if appropriate): Lying Oxygen Therapy SpO2: 96 % O2 Device: Room Air Pain Pain Assessment Pain Assessment: 0-10 Pain Score: 7  Pain Type: Acute pain Pain Location: Knee Pain Orientation: Left;Right Pain Descriptors / Indicators: Aching Pain Onset: On-going Home Living/Prior Functioning Home Living Living Arrangements: Spouse/significant other;Children Available Help at Discharge: Family;Available 24 hours/day Type of Home: House Home Access: Level entry Home Layout: (Split level, 3 steps to bottom from entry and 5 steps to top floor from entry) Alternate Level Stairs-Rails: Left Bathroom Shower/Tub: Tub/shower unit Bathroom Toilet: Standard Bathroom Accessibility: Yes  Lives With: Spouse;Family(wife works during the day but plans to arrange for 24/7 assist) Prior Function Level of Independence: Requires assistive device for independence;Independent with gait;Independent with transfers;Independent with basic ADLs(Mod I w/ SPC, occasional assistance to get out of recliner (lift chair) as it has not been working)  Able to [employer]?: Yes Driving: Yes Vocation: Theme park manager at [employer] Vision/Perception  Perception Perception: Within Functional Limits Praxis Praxis: Intact  Cognition Overall Cognitive Status: Within Functional Limits for tasks assessed Arousal/Alertness: Awake/alert Orientation Level: Oriented X4 Selective Attention: Appears intact Memory: Appears intact Awareness: Appears intact Problem Solving: Appears intact Safety/Judgment: Appears intact Sensation Sensation Light Touch: Appears Intact(pt denies sensation deficits but reports neuropathy in both feet) Proprioception: Appears Intact Coordination Gross Motor Movements are Fluid and Coordinated: No Fine Motor Movements are Fluid and Coordinated: No Coordination and Movement Description: impaired,  suspect 2/2 pain inhibition Motor  Motor Motor: Other (comment) Motor - Skilled Clinical Observations: generalized weakness and pain inhibition  Mobility Bed Mobility Bed Mobility: Rolling Right;Rolling Left Rolling Right: 4: Min assist Rolling Right Details: Manual facilitation for weight shifting;Verbal cues for technique;Tactile cues for initiation Rolling Left: 4: Min assist Rolling Left Details: Manual facilitation for weight shifting;Verbal cues for technique;Tactile cues for initiation Transfers Transfers: No Locomotion  Ambulation Ambulation: No Gait Gait: No Stairs / Additional Locomotion Stairs: No Wheelchair Mobility Wheelchair Mobility: No  Trunk/Postural Assessment    Unable to  assess  Balance Balance Balance Assessed: No Extremity Assessment  RLE Assessment RLE Assessment: Exceptions to WFL(all AROM movements painful, unable tolerate resisted motion or PROM beyond 50%) LLE Assessment LLE Assessment: Exceptions to WFL(all AROM movements painful, unable tolerate resisted motion or PROM beyond 50%)   See Function Navigator for Current Functional Status.   Refer to Care Plan for Long Term Goals  Recommendations for other services: Neuropsych  Discharge Criteria: Patient will be discharged from PT if patient refuses treatment 3 consecutive times without medical reason, if treatment goals not met, if there is a change in medical status, if patient makes no progress towards goals or if patient is discharged from hospital.  The above assessment, treatment plan, treatment alternatives and goals were discussed and mutually agreed upon: by patient  Cherika Jessie K Arnette 05/14/2017, 4:50 PM

## 2017-05-14 NOTE — Progress Notes (Signed)
Patient c/o pain to his left knee & foot. When nurse was doing assessment, his foot was touched to look at his heels & patient cried out. Patient stated that during the previous assessment, his leg was turned & it was now hurting. His feet & heels are highly sensitive & he states that its his neuropathy. BLE are edematous & heavy, but the anterior shins look as if some swelling had gone down & has that wrinkling appearance. This nurse could not lift his leg without patient crying out in pain. No weeping noted. Skin to both knees is warm to the touch, but no redness noted. No pitting edema noted to the knees, but there is some to bil feet, which are turned outward. While sitting, the patients leg is shaking slightly & he looked like he was in pain. Offered pain medication. Patient declined the percocet & robaxin that he has ordered prn & requested the tylenol. Tylenol was given as ordered.Also noticed that patient has been running a low grade fever for the last few days. WBC have been elevated sine 04/27/17. Patient has a history of chronic prednisone usage. On call provider was called & informed of patients pain, WBCs & low grade temperature. Verbal order was given for voltaren gel to the affected area with evaluation on rounds this morning. Patient was given heat packs & when voltaren gel arrived from pharmacy, it was applied. Did speak to patient with family present about the pain medication & informed him about the therapy schedule for the morning and how extensive the evaluations can be. He stated that if it gets unbearable, he will request it. He stated that he did not want to take them because they would make him sleepy. Patient did receive some relief. Will continue to monitor & inform on-coming nurse.

## 2017-05-14 NOTE — Progress Notes (Signed)
Subjective: Interval History: has complaints knee pain.  Objective: Vital signs in last 24 hours: Temp:  [99 F (37.2 C)-100.1 F (37.8 C)] 99 F (37.2 C) (03/01 0700) Pulse Rate:  [82-99] 82 (03/01 0809) Resp:  [12-25] 12 (03/01 0700) BP: (84-130)/(50-75) 100/57 (03/01 0809) SpO2:  [96 %-100 %] 96 % (03/01 0700) Weight:  [132.1 kg (291 lb 3.6 oz)-135.5 kg (298 lb 11.6 oz)] 132.1 kg (291 lb 3.6 oz) (02/28 1705) Weight change:   Intake/Output from previous day: 02/28 0701 - 03/01 0700 In: 123 [P.O.:120; I.V.:3] Out: -  Intake/Output this shift: Total I/O In: 60 [P.O.:60] Out: -   General appearance: alert, cooperative, no distress and morbidly obese Resp: diminished breath sounds bilaterally Chest wall: RIJ PC Cardio: S1, S2 normal and systolic murmur: holosystolic 2/6, blowing at apex GI: obese, pos bs liver down 5 cm Extremities: edema 1-2+ and AVF LLA  Lab Results: Recent Labs    05/12/17 0346 05/13/17 0352  WBC 30.1* 28.6*  HGB 10.4* 9.8*  HCT 31.7* 29.6*  PLT 499* 414*   BMET:  Recent Labs    05/12/17 1141 05/13/17 1230  NA 133* 134*  K 4.1 3.5  CL 98* 99*  CO2 20* 20*  GLUCOSE 164* 178*  BUN 45* 58*  CREATININE 5.22* 6.52*  CALCIUM 8.6* 8.3*   No results for input(s): PTH in the last 72 hours. Iron Studies: No results for input(s): IRON, TIBC, TRANSFERRIN, FERRITIN in the last 72 hours.  Studies/Results: No results found.  I have reviewed the patient's current medications.  Assessment/Plan: 1 ESRD HD tomorrow.  bp low , lower meds 2 anemia esa/Fe 3 HPTH vit D 4 DM controlled 5 RA 6 Debill 7Obesity P HD tomorrow, esa, lower meds.    LOS: 1 day   Jeneen Rinks Zelina Jimerson 05/14/2017,9:52 AM

## 2017-05-14 NOTE — IPOC Note (Signed)
Overall Plan of Care [facility]) Patient Details Name: John Bailey MRN: [id number] DOB: 23-Jun-1970  Admitting Diagnosis: [facility] Problems: Active Problems:   Debility   Cardiorenal syndrome with renal failure   Pain   Leukocytosis   PAF (paroxysmal atrial fibrillation) (HCC)   Acute on chronic diastolic (congestive) heart failure (HCC)   Type 2 diabetes mellitus with peripheral neuropathy (HCC)   Acute blood loss anemia   Anemia of chronic disease   ESRD on dialysis [facility])     Functional Problem List: Nursing Edema, Endurance, Medication Management, Motor, Pain, Safety, Skin Integrity, Sensory  PT Balance, Behavior, Endurance, Motor, Edema, Pain, Safety  OT Balance, Behavior, Edema, Endurance, Motor, Pain, Safety, Sensory, Vision, Skin Integrity  SLP    TR         Basic ADL's: OT Grooming, Bathing, Dressing, Toileting, Eating     Advanced  ADL's: OT       Transfers: PT Bed Mobility, Bed to Chair, Car, [employer], Futures trader, Metallurgist: PT Ambulation, Emergency planning/management officer, Stairs     Additional Impairments: OT None  SLP        TR      Anticipated Outcomes Item Anticipated Outcome  Self Feeding MOD I  Swallowing      Basic self-care  S  Toileting  S   Bathroom Transfers S toilet; MIN A shower transfer  Bowel/Bladder  Min assist  Transfers  Min assist  Locomotion  Min assist gait short distances  Communication     Cognition     Pain  <5 managed with meds  Safety/Judgment  Mod I assist   Therapy Plan: PT Intensity: Minimum of 1-2 x/day ,45 to 90 minutes PT Frequency: 5 out of 7 days PT Duration Estimated Length of Stay: 21-24 days OT Intensity: Minimum of 1-2 x/day, 45 to 90 minutes OT Frequency: 5 out of 7 days OT Duration/Estimated Length of Stay: 21-24 days      Team Interventions: Nursing Interventions Patient/Family Education, Disease Management/Prevention, Skin Care/Wound Management, Medication  Management, Discharge Planning, Pain Management  PT interventions Visual/perceptual remediation/compensation, Stair training, Disease management/prevention, Ambulation/gait training, Pain management, Patient/family education, Therapeutic Activities, Wheelchair propulsion/positioning, DME/adaptive equipment instruction, Training and development officer, Cognitive remediation/compensation, Psychosocial support, Therapeutic Exercise, Community reintegration, Functional mobility training, Skin care/wound management, UE/LE Strength taining/ROM, UE/LE Coordination activities, Splinting/orthotics, Neuromuscular re-education, Discharge planning  OT Interventions Balance/vestibular training, Discharge planning, Pain management, Self Care/advanced ADL retraining, Therapeutic Activities, UE/LE Coordination activities, Disease mangement/prevention, Functional mobility training, Patient/family education, Skin care/wound managment, Therapeutic Exercise, Community reintegration, Psychosocial support, DME/adaptive equipment instruction, Neuromuscular re-education, UE/LE Strength taining/ROM, Wheelchair propulsion/positioning  SLP Interventions    TR Interventions    SW/CM Interventions Discharge Planning, Psychosocial Support, Patient/Family Education   Barriers to Discharge MD  Medical stability, Weight and Hemodialysis  Nursing Weight, Wound Care    PT Inaccessible home environment, Home environment access/layout split-level house  OT Weight, Medication compliance, Other (comments)(pain)    SLP      SW       Team Discharge Planning: Destination: PT-Home ,OT- Home , SLP-  Projected Follow-up: PT-Home health PT, OT-  24 hour supervision/assistance, Home health OT, SLP-  Projected Equipment Needs: PT-To be determined, OT- Tub/shower seat, To be determined, 3 in 1 bedside comode, SLP-  Equipment Details: PT- , OT-  Patient/family involved in discharge planning: PT- Patient,  OT-Patient, SLP-   MD ELOS: 18-21  days. Medical Rehab Prognosis:  Good Assessment: 47 y.o.right handed  malewith history of rheumatoid arthritis maintained on chronic prednisone, peripheral vascular disease with lower extremity ischemic changes followed at the wound care center, diabetes mellitus, chronic fluid overload, atrial flutter with cardioversion maintained on Xarelto.  He had noted a two-week decline in his energy level.Presented 04/22/2017 with increasing weight gain as well as shortness of breath and difficulty with ambulation. His oxygen saturations were in the 80s with ambulation. Noted Lasix recently adjusted. BUN 51, creatinine 2.65, troponin negative. Chest x-ray showed interstitial pulmonary edema. Renal ultrasound no hydronephrosis. Echocardiogram with ejection fraction of 60% with severe LVH. Patient placed on intravenous Lasix. Renal services consulted for elevating creatinine with dialysis initiated and underwent placement of right IJ dialysis tunneled catheterand has been transitioned to permanent dialysis on a Tuesday Thursday Saturday.  Left AVF placed per Dr. early. Cardiology services consulted from by atrial fibrillation and underwent cardioversion 04/30/2017 placed on intravenous heparin and later transitioned toEliquis.Leukocytosis monitored while on prednisone. Patient with complaints of knee pain and xray, then MRI ordered showing meniscal/tendon injury, await Ortho recs. Patient with resulting functional deficits with mobility, self-care, transfers.  Will set goals for Min A with PT/OT.   See Team Conference Notes for weekly updates to the plan of care

## 2017-05-14 NOTE — Progress Notes (Signed)
[facility] PHYSICAL MEDICINE & REHABILITATION     PROGRESS NOTE  Subjective/Complaints:  Pt seen sitting up in bed this AM.  States he did not sleep well overnight due to knee pain. He states he does not think that he will be able to do therapies today because of pain.  ROS: + Bilateral knee pain. Denies CP, SOB, nausea, vomiting, diarrhea.  Objective: Vital Signs: Blood pressure 106/62, pulse 91, temperature 100.1 F (37.8 C), temperature source Oral, resp. rate 20, SpO2 97 %. No results found. Recent Labs    05/12/17 0346 05/13/17 0352  WBC 30.1* 28.6*  HGB 10.4* 9.8*  HCT 31.7* 29.6*  PLT 499* 414*   Recent Labs    05/12/17 1141 05/13/17 1230  NA 133* 134*  K 4.1 3.5  CL 98* 99*  GLUCOSE 164* 178*  BUN 45* 58*  CREATININE 5.22* 6.52*  CALCIUM 8.6* 8.3*   CBG (last 3)  Recent Labs    05/13/17 1757 05/13/17 2102 05/14/17 0649  GLUCAP 202* 160* 248*    Wt Readings from Last 3 Encounters:  05/13/17 132.1 kg (291 lb 3.6 oz)  04/02/17 (!) 154.9 kg (341 lb 9.6 oz)  03/30/17 (!) 152.6 kg (336 lb 6.4 oz)    Physical Exam:  BP 106/62 (BP Location: Right Arm)   Pulse 91   Temp 100.1 F (37.8 C) (Oral)   Resp 20   SpO2 97%  Constitutional: Obese male. NAD.  HENT: Normocephalic. Atraumatic. Eyes:EOMare normal. No discharge.  Cardiovascular:RRR. No JVD. Respiratory:Effort normal and breath sounds normal.  GI: Bowel sounds are normal. He exhibitsno distension.  Musc:  Bouchard's deformities  TTP left hand with edema Pain with bilateral knee, edema +Edema b/l LE Neurological: Alert and oriented.  Motor: Right upper extremity 3+/5 proximal to distal (pain inhibition) Left upper extremity: Shoulder abduction, elbow flexion/extension 3 -/5, hand grip 1+/5 (pain inhibition) Bilateral lower extremities: 2 -/5 proximal to distal (some pain inhibition)  Sensation is intact to light touch bilateral upper and lower limbs Skin. LE ischemic changes.    Assessment/Plan: 1. Functional deficits secondary to debility which require 3+ hours per day of interdisciplinary therapy in a comprehensive inpatient rehab setting. Physiatrist is providing close team supervision and 24 hour management of active medical problems listed below. Physiatrist and rehab team continue to assess barriers to discharge/monitor patient progress toward functional and medical goals.  Function:  Bathing Bathing position      Bathing parts      Bathing assist        Upper Body Dressing/Undressing Upper body dressing                    Upper body assist        Lower Body Dressing/Undressing Lower body dressing                                  Lower body assist        Toileting Toileting          Toileting assist     Transfers Chair/bed transfer Chair/bed transfer activity did not occur: Safety/medical concerns           Manufacturing systems engineer          Cognition Comprehension Comprehension assist level: Follows complex conversation/direction with no assist  Expression Expression assist level: Expresses complex ideas: With  no assist  Social Interaction Social Interaction assist level: Interacts appropriately with others - No medications needed.  Problem Solving Problem solving assist level: Solves complex problems: With extra time  Memory Memory assist level: Complete Independence: No helper    Medical Problem List and Plan: 1.Debilitysecondary to cardiorenal syndrome, synovitis left hand due to RA   Begin CIR   Notes reviewed, labs reviewed, images reviewed 2. DVT Prophylaxis/Anticoagulation: Eliquis. Monitor for any bleeding episodes 3. Pain Management:Neurontin 300 mg daily, Robaxin as needed   Voltaren gel added for joint pain. 4. Mood:Provide emotional support 5. Neuropsych: This patientiscapable of making decisions on hisown behalf. 6. Skin/Wound Care:Routine skin  checks 7. Fluids/Electrolytes/Nutrition:Routine I&O's 8.End-stage renal disease. Hemodialysis initiated Tuesday Thursday Saturday schedule. Patient with left AVFgraft placed 05/10/2017   Appreciate nephro recs. 9.Acute on chronic anemia. Continue Aranesp.    Hb 9.8 on 2/28   Cont to monitor 10.Diabetes mellitus with peripheral neuropathy. Latest hemoglobin A1c 9.2. Diabetic teaching   Lantus insulin 25 units daily at bedtime   NovoLog 3 units 3 times a day.    Monitor with increased mobility 11.Acute on chronic diastolic congestive heart failure. Monitor for any signs of fluid overload. Continue Coreg 6.25mg  BID  There were no vitals filed for this visit. 12.Rheumatoid arthritis. Prednisone 10 mg daily. Patient had been receiving Abataceptevery Tuesday prior to admission. Added Voltaren gel. Follow-up rheumatologist Dr.Ziokowska   B/l knee films reviewed with edema 13.Atrial fibrillation RVR. Status post cardioversion. Amiodarone 200 mg twice a day 1 more days THEN DECREASEto 200 mg daily 14.Leukocytosis. Felt to be related to prednisone.    Low grade fever   WBCs 28.6 on 2/28   Repeat knee and hand films ordered   Cont to monitor  LOS (Days) 1 A FACE TO FACE EVALUATION WAS PERFORMED  Ankit Lorie Phenix 05/14/2017 7:18 AM

## 2017-05-14 NOTE — Progress Notes (Signed)
Social Work  Social Work Assessment and Plan  Patient Details  Name: John Bailey MRN: [id number] Date of Birth: [birth year]  Today's Date: 05/14/2017  Problem List:  Patient Active Problem List   Diagnosis Date Noted  . Pain   . Leukocytosis   . PAF (paroxysmal atrial fibrillation) ([place])   . Acute on chronic diastolic (congestive) heart failure ([place])   . Type 2 diabetes mellitus with peripheral neuropathy (HCC)   . Acute blood loss anemia   . Anemia of chronic disease   . ESRD on dialysis ([place])   . Cardiorenal syndrome with renal failure   . Cardiorenal syndrome, stage 1-4 or unspecified chronic kidney disease, with heart failure ([place]) 05/06/2017  . Atrial fibrillation, chronic ([employer]) 04/22/2017  . Hypoxia 04/22/2017  . Excessive daytime sleepiness 04/02/2017  . Debility 04/02/2017  . Bradycardia 03/28/2017  . CKD (chronic kidney disease) stage 4, GFR 15-29 ml/min (HCC) 03/28/2017  . Pseudogout 03/09/2017  . Rheumatoid arthritis ([place]) 03/09/2017  . Infected ulcer of skin, with fat layer exposed ([place]) 03/09/2017  . Normocytic anemia 03/06/2017  . Elevated troponin 03/06/2017  . Acute renal failure superimposed on stage 4 chronic kidney disease ([place]) 03/06/2017  . Essential hypertension 12/29/2016  . Persistent atrial fibrillation ([place])   . Left leg cellulitis   . Atrial fibrillation with RVR ([place]) 12/02/2016  . Sepsis ([place]) 12/02/2016  . Acute on chronic congestive heart failure ([place])   . Acute on chronic renal insufficiency   . Chronic diastolic CHF (congestive heart failure) ([place]) 03/20/2016  . Nonischemic cardiomyopathy ([place]) 11/11/2015  . Long term (current) use of anticoagulants [Z79.01] 08/14/2015  . Insulin-dependent diabetes mellitus with renal complications ([place]) 46/65/9935  . Atrial flutter with rapid ventricular response ([place]) 08/06/2015  . Hyponatremia 07/30/2015  . Acute kidney injury ([place]) 07/30/2015  . Hyperkalemia 07/30/2015  . Acute on chronic diastolic  CHF (congestive heart failure) ([place]) 07/30/2015  . Morbid obesity ([place]) 07/30/2015  . Paroxysmal atrial fibrillation ([place]) 07/30/2015  . 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 Hypertensive heart disease with heart failure ([place]) 07/14/2012  . Dyspnea 07/14/2012  . Hyperlipidemia 07/14/2012   Past Medical History:  Past Medical History:  Diagnosis Date  . Atrial flutter with rapid ventricular response ([place]) 08/06/2015   a. s/p DCCV in 07/2015 with recurrent atrial fibrillation and DCCV in 12/2016. Initially successful but noted to be back in atrial fibrillation within 2 weeks of DCCV.   [name] Chronic diastolic (congestive) heart failure (HCC)    a. EF 50-55% by echo in 06/2016.  [name] Chronic kidney disease   . Diabetes ([place])   . Dysrhythmia    Aflutter  . Hypertension   . Peripheral vascular disease ([place])   . Rheumatoid arthritis [facility])    Past Surgical History:  Past Surgical History:  Procedure Laterality Date  . AV FISTULA PLACEMENT Left 05/10/2017   Procedure: CREATION of Left Radicephalic Fistula;  Surgeon: Rosetta Posner, MD;  Location: [facility];  Service: Vascular;  Laterality: Left;  . CARDIOVERSION N/A 08/01/2015   Procedure: CARDIOVERSION;  Surgeon: Josue Hector, MD;  Location: [facility] ENDOSCOPY;  Service: Cardiovascular;  Laterality: N/A;  . CARDIOVERSION N/A 12/18/2016   Procedure: CARDIOVERSION;  Surgeon: [name], MD;  [facility] END[facility]COPY;  Service: Cardiovascular;  Laterality: N/A;  . CARDIOVERSION N/A 04/30/2017   Procedure: CARDIOVERSION;  Surgeon: Larey Dresser, MD;  Location: [facility] ENDOSCOPY;  Service: Cardiovascular;  Laterality: N/A;  . CHOLECYSTECTOMY    .  INSERTION OF DIALYSIS CATHETER Right 05/04/2017   Procedure: INSERTION OF TUNNELED  DIALYSIS CATHETER;  Surgeon: Conrad Cross Anchor, MD;  Location: [facility];  Service: Vascular;  Laterality: Right;  . IR FLUORO GUIDE CV LINE RIGHT  04/27/2017  . IR US GUIDE VASC ACCESS RIGHT  04/27/2017   . TEE WITHOUT CARDIOVERSION N/A 08/01/2015   Procedure: TRANSESOPHAGEAL ECHOCARDIOGRAM (TEE);  Surgeon: Josue Hector, MD;  Location: [facility] ENDOSCOPY;  Service: Cardiovascular;  Laterality: N/A;   Social History:  reports that  has never smoked. he has never used smokeless tobacco. He reports that he does not drink alcohol or use drugs.  Family / Support Systems Marital Status: Married Patient Roles: Spouse, Parent, Other (Comment)(Pastor) Spouse/Significant Other: Trina-wife [phone]-cell Children:  Two children age 57 & 1 Other Supports: Friends and church members Anticipated Caregiver: Wife and freinds Ability/Limitations of Caregiver: Wife works days and busy with their children Caregiver Availability: Other (Comment)(Work on a 24 hr care plan) Family Dynamics: Close knit family who pull together when times of crisis. He has good friends and church members who will help him at home, he is hopeful to only need supervision or intermittent assist. He feels he is here to get to this level.  Social History Preferred language: English Religion: Christian Cultural Background: No issues Education: Some college Read: Yes Write: Yes Employment Status: Employed Name of Employer: [employer] Return to Work Plans: Plans to return when medically able Freight forwarder Issues: No issues Guardian/Conservator: None-according to MD pt is capable of making his own decisions while here, will make sure wife is aware due to pt wants her included   Abuse/Neglect Abuse/Neglect Assessment Can Be Completed: Yes Physical Abuse: Denies Verbal Abuse: Denies Sexual Abuse: Denies Exploitation of patient/patient's resources: Denies Self-Neglect: Denies  Emotional Status Pt's affect, behavior adn adjustment status: Pt is motivated to do well, but is concerned aobut his knee pain today. Having x-ray and MRI of it today. Have missed some of his therapies due to the tests he has  had. He is very deconditioned and needs to work up to three hours and will need rest breaks in his therapy day. Recent Psychosocial Issues: health issues-multiple issues New to HD Pyschiatric History: No history has a very strong faith and feels this will pull him through along with his members in the church. With his young age and what he does do feel he would benefit from seeing neuro-psych while here. Will ask team for input. Substance Abuse History: No issues  Patient / Family Perceptions, Expectations & Goals Pt/Family understanding of illness & functional limitations: Pt and wife can explain his multiple medical issues and the treatments he has had. Both seem to have spoken with the MD and feel they have a good understanding going forward. Wife is hopeful he will do well here and be functional before going home. Premorbid pt/family roles/activities: Husband, father, pastor, friend, etc Anticipated changes in roles/activities/participation: resume Pt/family expectations/goals: Pt states: " I want to be able to do for myself like I did before coming in here." Wife states: " I hope he can do for himself, I am busy with working and the children."  [employer]: None Premorbid Home Care/DME Agencies: Other (Comment)(cane and [facility]) Transportation available at discharge: Family and friends Resource referrals recommended: Neuropsychology, Support group (specify)  Discharge Planning Living Arrangements: Spouse/significant other, Children Support Systems: Spouse/significant other, Children, Friends/neighbors, Church/faith community Type of Residence: Private residence Insurance Resources: Private  Insurance (specify)(BCBS) Financial Resources: Family Support, Employment Financial Screen Referred: No Living Expenses: Education officer, community Management: Patient, Spouse Does the patient have any problems obtaining your medications?: No Home Management: Wife Patient/Family Preliminary  Plans: Return home with wife and children. He reports he has many people that will be in and out while his wife works. So it will not be a problem if team recommends care at discharge. Will await therapy evaluations and work on a safe plan. Unsure if he realizes he will need to push himself in therapies to achieve his goals before leaving the hospital. Social Work Anticipated Follow Up Needs: HH/OP, Support Group  Clinical Impression Pleasant gentleman who is currently in pain with his knee and wanting to miss therapies and get pain meds. He feels confident he will reach mod/i level and be home soon. He has a ways to go to achieve these goals and will need to push himself to achieve these. His wife works during the day and he reports to have friends who will be in and out to help him. Will await team's evaluations and work on a safe discharge plan.   Elease Hashimoto 05/14/2017, 3:03 PM

## 2017-05-14 NOTE — Progress Notes (Signed)
Patient had a large watery, runny incontinent bowel movement this morning. Report given to on-coming nurse & provider on call.

## 2017-05-14 NOTE — Care Management Note (Signed)
Inpatient [facility] Individual Statement of Services  Patient Name:  John Bailey  Date:  05/14/2017  Welcome to the [facility].  Our goal is to provide you with an individualized program based on your diagnosis and situation, designed to meet your specific needs.  With this comprehensive rehabilitation program, you will be expected to participate in at least 3 hours of rehabilitation therapies Monday-Friday, with modified therapy programming on the weekends.  Your rehabilitation program will include the following services:  Physical Therapy (PT), Occupational Therapy (OT), 24 hour per day rehabilitation nursing, Neuropsychology, Case Management (Social Worker), Rehabilitation Medicine, Nutrition Services and Pharmacy Services  Weekly team conferences will be held on Wednesday to discuss your progress.  Your Social Worker will talk with you frequently to get your input and to update you on team discussions.  Team conferences with you and your family in attendance may also be held.  Expected length of stay: 21-24 days  Overall anticipated outcome: supervision set up  Depending on your progress and recovery, your program may change. Your Social Worker will coordinate services and will keep you informed of any changes. Your Social Worker's name and contact numbers are listed  below.  The following services may also be recommended but are not provided by the [facility] will be made to provide these services after discharge if needed.  Arrangements include referral to agencies that provide these services.  Your insurance has been verified to be:  [place] Your primary doctor is:  Gala Romney  Pertinent information will be shared with your doctor and your insurance company.  Social Worker:  Ovidio Kin, [place] or ([phone]  Information discussed with and copy given to patient by: Elease Hashimoto, 05/14/2017, 11:17 AM

## 2017-05-15 DIAGNOSIS — M175 Other unilateral secondary osteoarthritis of knee: Secondary | ICD-10-CM

## 2017-05-15 LAB — RENAL FUNCTION PANEL
ALBUMIN: 2.2 g/dL — AB (ref 3.5–5.0)
ANION GAP: 16 — AB (ref 5–15)
BUN: 61 mg/dL — AB (ref 6–20)
CALCIUM: 8.4 mg/dL — AB (ref 8.9–10.3)
CO2: 20 mmol/L — ABNORMAL LOW (ref 22–32)
Chloride: 97 mmol/L — ABNORMAL LOW (ref 101–111)
Creatinine, Ser: 7.38 mg/dL — ABNORMAL HIGH (ref 0.61–1.24)
GFR calc Af Amer: 9 mL/min — ABNORMAL LOW (ref >60)
GFR, EST NON AFRICAN AMERICAN: 8 mL/min — AB (ref >60)
GLUCOSE: 234 mg/dL — AB (ref 65–99)
PHOSPHORUS: 7.5 mg/dL — AB (ref 2.5–4.6)
Potassium: 2.3 mmol/L — CL (ref 3.5–5.1)
SODIUM: 133 mmol/L — AB (ref 135–145)

## 2017-05-15 LAB — GLUCOSE, CAPILLARY
GLUCOSE-CAPILLARY: 194 mg/dL — AB (ref 65–99)
Glucose-Capillary: 161 mg/dL — ABNORMAL HIGH (ref 65–99)
Glucose-Capillary: 167 mg/dL — ABNORMAL HIGH (ref 65–99)

## 2017-05-15 LAB — CBC
HEMATOCRIT: 26.5 % — AB (ref 39.0–52.0)
HEMOGLOBIN: 9 g/dL — AB (ref 13.0–17.0)
MCH: 28 pg (ref 26.0–34.0)
MCHC: 34 g/dL (ref 30.0–36.0)
MCV: 82.6 fL (ref 78.0–100.0)
Platelets: 337 10*3/uL (ref 150–400)
RBC: 3.21 MIL/uL — AB (ref 4.22–5.81)
RDW: 19.6 % — ABNORMAL HIGH (ref 11.5–15.5)
WBC: 29.6 10*3/uL — ABNORMAL HIGH (ref 4.0–10.5)

## 2017-05-15 MED ORDER — HEPARIN SODIUM (PORCINE) 1000 UNIT/ML DIALYSIS: 1000.0000 [IU] | INTRAMUSCULAR | Status: DC | PRN

## 2017-05-15 MED ORDER — SODIUM CHLORIDE 0.9 % IV SOLN: 100.0000 mL | INTRAVENOUS | Status: DC | PRN

## 2017-05-15 MED ORDER — HEPARIN SODIUM (PORCINE) 1000 UNIT/ML DIALYSIS: 100.0000 [IU]/kg | INTRAMUSCULAR | Status: DC | PRN

## 2017-05-15 MED ORDER — ALTEPLASE 2 MG IJ SOLR: 2.0000 mg | Freq: Once | INTRAMUSCULAR | Status: DC | PRN

## 2017-05-15 MED ORDER — LIDOCAINE HCL (PF) 1 % IJ SOLN: 5.0000 mL | INTRAMUSCULAR | Status: DC | PRN

## 2017-05-15 MED ORDER — LIDOCAINE-PRILOCAINE 2.5-2.5 % EX CREA: 1.0000 "application " | TOPICAL_CREAM | CUTANEOUS | Status: DC | PRN

## 2017-05-15 MED ORDER — PENTAFLUOROPROP-TETRAFLUOROETH EX AERO: 1.0000 "application " | INHALATION_SPRAY | CUTANEOUS | Status: DC | PRN

## 2017-05-15 NOTE — Progress Notes (Signed)
Orthopedic Tech Progress Note Patient Details:  John Bailey [birth year] [id number]  Patient ID: John Bailey, male   DOB: [birth year], 47 y.o.   MRN: [id number]   [name] 05/15/2017, 11:57 AM Called in hanger brace order; spoke with answering service

## 2017-05-15 NOTE — Progress Notes (Signed)
[place] PHYSICAL MEDICINE & REHABILITATION     PROGRESS NOTE  Subjective/Complaints:  Knee pain a little better has numbness in Left hand  ROS: + Bilateral knee pain. Denies CP, SOB, nausea, vomiting, diarrhea.  Objective: Vital Signs: Blood pressure 115/66, pulse 84, temperature 98.2 F (36.8 C), temperature source Oral, resp. rate 18, weight 129 kg (284 lb 6.3 oz), SpO2 96 %. Mr Knee Right Wo Contrast  Result Date: 05/14/2017 CLINICAL DATA:  Right knee pain and swelling.  No time course given. EXAM: MRI OF THE RIGHT KNEE WITHOUT CONTRAST TECHNIQUE: Multiplanar, multisequence MR imaging of the knee was performed. No intravenous contrast was administered. COMPARISON:  Radiographs 05/14/2017 FINDINGS: MENISCI Medial meniscus:  Intact Lateral meniscus:  Peripheral anterior horn tear. LIGAMENTS Cruciates: Intact. Moderate mucoid degeneration of the ACL and PCL. Collaterals:  Intact.  MCL and pes anserine bursitis. CARTILAGE Patellofemoral: Markedly age advanced degenerative chondrosis with areas of full-thickness cartilage loss and subchondral cystic change (grade 4 chondromalacia). Medial: Moderate degenerative chondrosis and subchondral cystic changes. Lateral: Moderate degenerative chondrosis and subchondral cystic changes. Joint: Large joint effusion and moderate synovitis. Superior patellar plica are noted. Popliteal Fossa:  No popliteal mass or Baker's cyst. Extensor Mechanism: The patella retinacular structures are intact and the quadriceps and patellar tendons are intact. There is a longitudinal split type tear involving the patellar tendon and significant distal tendinopathy. Bones:  No acute bony findings. Other: Normal knee musculature. IMPRESSION: 1. Peripheral tear involving the anterior horn of the lateral meniscus. 2. Mucoid degeneration of the ACL and PCL. 3. Age advanced tricompartmental degenerative changes most significantly involving the patellofemoral joint. 4. Longitudinal split  type tear involving the patellar tendon and significant distal tendinopathy. 5. Large joint effusion and moderate synovitis. Electronically Signed   By: Marijo Sanes M.D.   On: 05/14/2017 14:45   Dg Knee Complete 4 Views Left  Result Date: 05/14/2017 CLINICAL DATA:  Bilateral knee pain, rheumatoid arthritis EXAM: LEFT KNEE - COMPLETE 4+ VIEW COMPARISON:  05/14/2017 FINDINGS: No evidence of fracture, dislocation, or joint effusion. No evidence of arthropathy or other focal bone abnormality. Soft tissues are unremarkable. IMPRESSION: Negative. Electronically Signed   By: Jerilynn Mages.  Shick M.D.   On: 05/14/2017 10:30   Dg Knee Complete 4 Views Right  Addendum Date: 05/14/2017   ADDENDUM REPORT: 05/14/2017 10:37 ADDENDUM: Cystic area in the patella measures 1.4 x 1.0 cm. Electronically Signed   By: Lowella Grip III M.D.   On: 05/14/2017 10:37   Result Date: 05/14/2017 CLINICAL DATA:  Pain EXAM: RIGHT KNEE - COMPLETE 4+ VIEW COMPARISON:  April 26, 2017 and September 05, 2015 FINDINGS: Frontal, bilateral oblique, and lateral views were obtained. There is soft tissue swelling somewhat diffusely. No fracture or dislocation. There is mild lateral patellar subluxation. There is a moderate joint effusion. There is a cystic area in the patella which is unchanged from recent prior study and appears benign. There is no appreciable joint space narrowing. No erosive change. IMPRESSION: Diffuse soft tissue swelling. Evidence of mild lateral patellar subluxation without dislocation. Moderate joint effusion. No fracture. No erosive change. There is no appreciable joint space narrowing. There is a benign appearing cystic area within the patella which appears stable compared to recent study but was not appreciable in 2017. Etiology for this cystic area uncertain. An atypical infectious lesion within the patella conceivably could present in this manner. Given this appearance as well as the apparent joint effusion, it may be prudent to  correlate with MR  to further assess. These results will be called to the ordering clinician or representative by the Radiologist Assistant, and communication documented in the PACS or zVision Dashboard. Electronically Signed: By: Lowella Grip III M.D. On: 05/14/2017 10:34   Dg Hand Complete Left  Result Date: 05/14/2017 CLINICAL DATA:  Left hand pain.  History of rheumatoid arthritis. EXAM: LEFT HAND - COMPLETE 3+ VIEW COMPARISON:  None. FINDINGS: No acute fracture or dislocation. There are erosive changes involving the proximal pole of the scaphoid, the lunate, the triquetrum, the third metacarpal head, and the radial aspect of the middle finger middle phalanx head. Joint spaces are relatively preserved. Two well-defined lucent lesions within the thumb distal phalanx with associated thinning of the adjacent cortex. There is no periosteal reaction. Small hooked osteophyte involving the second metacarpal head. Mild periarticular osteopenia. Mild dorsal hand soft tissue swelling. IMPRESSION: 1. Scattered erosive changes involving the carpal bones as well as the third metacarpal head and middle finger middle phalanx, consistent with history of rheumatoid arthritis. 2. Two more well-defined lucent lesions within the base of the thumb distal phalanx may represent small enchondromas, versus large erosions. Electronically Signed   By: Titus Dubin M.D.   On: 05/14/2017 10:49   Recent Labs    05/13/17 0352  WBC 28.6*  HGB 9.8*  HCT 29.6*  PLT 414*   Recent Labs    05/12/17 1141 05/13/17 1230  NA 133* 134*  K 4.1 3.5  CL 98* 99*  GLUCOSE 164* 178*  BUN 45* 58*  CREATININE 5.22* 6.52*  CALCIUM 8.6* 8.3*   CBG (last 3)  Recent Labs    05/14/17 1628 05/14/17 2044 05/15/17 0638  GLUCAP 212* 201* 161*    Wt Readings from Last 3 Encounters:  05/15/17 129 kg (284 lb 6.3 oz)  05/13/17 132.1 kg (291 lb 3.6 oz)  04/02/17 (!) 154.9 kg (341 lb 9.6 oz)    Physical Exam:  BP 115/66 (BP  Location: Right Arm)   Pulse 84   Temp 98.2 F (36.8 C) (Oral)   Resp 18   Wt 129 kg (284 lb 6.3 oz)   SpO2 96%   BMI 38.57 kg/m  Constitutional: Obese male. NAD.  HENT: Normocephalic. Atraumatic. Eyes:EOMare normal. No discharge.  Cardiovascular:RRR. No JVD. Respiratory:Effort normal and breath sounds normal.  GI: Bowel sounds are normal. He exhibitsno distension.  Musc:  Bouchard's deformities  TTP left hand with edema Pain with bilateral knee, edema +Edema b/l LE Neurological: Alert and oriented.  Motor: Right upper extremity 3+/5 proximal to distal (pain inhibition) Left upper extremity: Shoulder abduction, elbow flexion/extension 3 -/5, hand grip 1+/5 (pain inhibition) Bilateral lower extremities: 2 -/5 proximal to distal (some pain inhibition)  Sensation is intact to light touch bilateral upper and lower limbs Skin. LE ischemic changes.  Left hand warm, decreased ROM in MCP and PIP with pain, reduced sensation LT in median nerve distribution Assessment/Plan: 1. Functional deficits secondary to debility which require 3+ hours per day of interdisciplinary therapy in a comprehensive inpatient rehab setting. Physiatrist is providing close team supervision and 24 hour management of active medical problems listed below. Physiatrist and rehab team continue to assess barriers to discharge/monitor patient progress toward functional and medical goals.  Function:  Bathing Bathing position   Position: Sitting EOB  Bathing parts Body parts bathed by patient: Left arm, Chest, Abdomen, Front perineal area, Right upper leg, Left upper leg Body parts bathed by helper: Right arm, Buttocks, Left lower leg, Right lower leg,  Back  Bathing assist Assist Level: 2 helpers(MOD A; 2 helpers sit to stand for buttock hygiene)      Upper Body Dressing/Undressing Upper body dressing   What is the patient wearing?: Pull over shirt/dress     Pull over shirt/dress - Perfomed by patient:  Thread/unthread right sleeve, Thread/unthread left sleeve, Put head through opening, Pull shirt over trunk          Upper body assist        Lower Body Dressing/Undressing Lower body dressing   What is the patient wearing?: Underwear, Pants, Shoes   Underwear - Performed by helper: Thread/unthread right underwear leg, Thread/unthread left underwear leg, Pull underwear up/down   Pants- Performed by helper: Thread/unthread right pants leg, Thread/unthread left pants leg, Pull pants up/down           Shoes - Performed by helper: Don/doff right shoe, Don/doff left shoe(slippers)          Lower body assist Assist for lower body dressing: 2 Helpers      Toileting Toileting Toileting activity did not occur: No continent bowel/bladder event        Toileting assist     Transfers Chair/bed transfer Chair/bed transfer activity did not occur: Refused           Locomotion Ambulation Ambulation activity did not occur: Scientist, research (physical sciences) activity did not occur: Refused        Cognition Comprehension Comprehension assist level: Follows complex conversation/direction with no assist  Expression Expression assist level: Expresses complex ideas: With no assist  Social Interaction Social Interaction assist level: Interacts appropriately with others - No medications needed.  Problem Solving Problem solving assist level: Solves complex problems: Recognizes & self-corrects  Memory Memory assist level: Complete Independence: No helper    Medical Problem List and Plan: 1.Debilitysecondary to cardiorenal syndrome, synovitis left hand due to RA   CIR PT, OT, pt resisting therapy due to knee pain , agrees to participate wit knee orthoses   Notes reviewed, labs reviewed, images reviewed 2. DVT Prophylaxis/Anticoagulation: Eliquis. Monitor for any bleeding episodes 3. Pain Management:Neurontin 300 mg daily, Robaxin as needed   Voltaren gel added for joint  pain. 4. Mood:Provide emotional support 5. Neuropsych: This patientiscapable of making decisions on hisown behalf. 6. Skin/Wound Care:Routine skin checks 7. Fluids/Electrolytes/Nutrition:Routine I&O's 8.End-stage renal disease. Hemodialysis initiated Tuesday Thursday Saturday schedule. Patient with left AVFgraft placed 05/10/2017   Appreciate nephro recs. 9.Acute on chronic anemia. Continue Aranesp.    Hb 9.8 on 2/28   Cont to monitor 10.Diabetes mellitus with peripheral neuropathy. Latest hemoglobin A1c 9.2. Diabetic teaching   Lantus insulin 25 units daily at bedtime   NovoLog 3 units 3 times a day.    Monitor with increased mobility 11.Acute on chronic diastolic congestive heart failure. Monitor for any signs of fluid overload. Continue Coreg 6.25mg  BID  Filed Weights   05/15/17 0500  Weight: 129 kg (284 lb 6.3 oz)   12.Rheumatoid arthritis. Prednisone 10 mg daily. Patient had been receiving Abataceptevery Tuesday prior to admission. Added Voltaren gel. Follow-up rheumatologist Dr.Ziokowska   MRI right knee tricompartmental OA, meniscal tear and patellar tendinosis- will order bilateral knee braces 13.Atrial fibrillation RVR. Status post cardioversion. Amiodarone 200 mg twice a day 1 more days THEN DECREASEto 200 mg daily 14.Leukocytosis. Felt to be related to prednisone.    Low grade fever   WBCs 28.6 on 2/28   Repeat knee and hand films ordered  Cont to monitor 15.  Left hand numbness likel carpal tunnel associated with RA vs part of a more diffuse polyneuropathy, given ulnar sparing favor the former will order wrist splint LOS (Days) 2 A FACE TO FACE EVALUATION WAS PERFORMED  Charlett Blake 05/15/2017 7:43 AM

## 2017-05-15 NOTE — Procedures (Signed)
I was present at this session.  I have reviewed the session itself and made appropriate changes.  HD via pc.  bp in 90s, will not get vol off.  Flows ok. tol well  Mauricia Area 3/2/201910:25 AM

## 2017-05-15 NOTE — Progress Notes (Signed)
Subjective: Interval History: has complaints sleepy after pain meds.  Knee issues to get braces.  Objective: Vital signs in last 24 hours: Temp:  [98 F (36.7 C)-98.4 F (36.9 C)] 98.4 F (36.9 C) (03/02 0845) Pulse Rate:  [79-85] 79 (03/02 0845) Resp:  [17-18] 17 (03/02 0845) BP: (97-115)/(54-66) 101/55 (03/02 0845) SpO2:  [96 %-98 %] 98 % (03/02 0845) Weight:  [129 kg (284 lb 6.3 oz)-129.2 kg (284 lb 13.4 oz)] 129.2 kg (284 lb 13.4 oz) (03/02 0845) Weight change:   Intake/Output from previous day: 03/01 0701 - 03/02 0700 In: 180 [P.O.:180] Out: -  Intake/Output this shift: No intake/output data recorded.  General appearance: cooperative, no distress, morbidly obese and slowed mentation Resp: diminished breath sounds bilaterally Chest wall: IJ cath Cardio: irregularly irregular rhythm and systolic murmur: holosystolic 2/6, blowing at apex GI: obese, pos bs, liver down 5 cm Extremities: edema 2-3+  Lab Results: Recent Labs    05/13/17 0352 05/15/17 0921  WBC 28.6* 29.6*  HGB 9.8* 9.0*  HCT 29.6* 26.5*  PLT 414* 337   BMET:  Recent Labs    05/13/17 1230 05/15/17 0921  NA 134* 133*  K 3.5 2.3*  CL 99* 97*  CO2 20* 20*  GLUCOSE 178* 234*  BUN 58* 61*  CREATININE 6.52* 7.38*  CALCIUM 8.3* 8.4*   No results for input(s): PTH in the last 72 hours. Iron Studies: No results for input(s): IRON, TIBC, TRANSFERRIN, FERRITIN in the last 72 hours.  Studies/Results: Mr Knee Right Wo Contrast  Result Date: 05/14/2017 CLINICAL DATA:  Right knee pain and swelling.  No time course given. EXAM: MRI OF THE RIGHT KNEE WITHOUT CONTRAST TECHNIQUE: Multiplanar, multisequence MR imaging of the knee was performed. No intravenous contrast was administered. COMPARISON:  Radiographs 05/14/2017 FINDINGS: MENISCI Medial meniscus:  Intact Lateral meniscus:  Peripheral anterior horn tear. LIGAMENTS Cruciates: Intact. Moderate mucoid degeneration of the ACL and PCL. Collaterals:  Intact.   MCL and pes anserine bursitis. CARTILAGE Patellofemoral: Markedly age advanced degenerative chondrosis with areas of full-thickness cartilage loss and subchondral cystic change (grade 4 chondromalacia). Medial: Moderate degenerative chondrosis and subchondral cystic changes. Lateral: Moderate degenerative chondrosis and subchondral cystic changes. Joint: Large joint effusion and moderate synovitis. Superior patellar plica are noted. Popliteal Fossa:  No popliteal mass or Baker's cyst. Extensor Mechanism: The patella retinacular structures are intact and the quadriceps and patellar tendons are intact. There is a longitudinal split type tear involving the patellar tendon and significant distal tendinopathy. Bones:  No acute bony findings. Other: Normal knee musculature. IMPRESSION: 1. Peripheral tear involving the anterior horn of the lateral meniscus. 2. Mucoid degeneration of the ACL and PCL. 3. Age advanced tricompartmental degenerative changes most significantly involving the patellofemoral joint. 4. Longitudinal split type tear involving the patellar tendon and significant distal tendinopathy. 5. Large joint effusion and moderate synovitis. Electronically Signed   By: Marijo Sanes M.D.   On: 05/14/2017 14:45   Dg Knee Complete 4 Views Left  Result Date: 05/14/2017 CLINICAL DATA:  Bilateral knee pain, rheumatoid arthritis EXAM: LEFT KNEE - COMPLETE 4+ VIEW COMPARISON:  05/14/2017 FINDINGS: No evidence of fracture, dislocation, or joint effusion. No evidence of arthropathy or other focal bone abnormality. Soft tissues are unremarkable. IMPRESSION: Negative. Electronically Signed   By: Jerilynn Mages.  Shick M.D.   On: 05/14/2017 10:30   Dg Knee Complete 4 Views Right  Addendum Date: 05/14/2017   ADDENDUM REPORT: 05/14/2017 10:37 ADDENDUM: Cystic area in the patella measures 1.4 x 1.0  cm. Electronically Signed   By: Lowella Grip III M.D.   On: 05/14/2017 10:37   Result Date: 05/14/2017 CLINICAL DATA:  Pain EXAM:  RIGHT KNEE - COMPLETE 4+ VIEW COMPARISON:  April 26, 2017 and September 05, 2015 FINDINGS: Frontal, bilateral oblique, and lateral views were obtained. There is soft tissue swelling somewhat diffusely. No fracture or dislocation. There is mild lateral patellar subluxation. There is a moderate joint effusion. There is a cystic area in the patella which is unchanged from recent prior study and appears benign. There is no appreciable joint space narrowing. No erosive change. IMPRESSION: Diffuse soft tissue swelling. Evidence of mild lateral patellar subluxation without dislocation. Moderate joint effusion. No fracture. No erosive change. There is no appreciable joint space narrowing. There is a benign appearing cystic area within the patella which appears stable compared to recent study but was not appreciable in 2017. Etiology for this cystic area uncertain. An atypical infectious lesion within the patella conceivably could present in this manner. Given this appearance as well as the apparent joint effusion, it may be prudent to correlate with MR to further assess. These results will be called to the ordering clinician or representative by the Radiologist Assistant, and communication documented in the PACS or zVision Dashboard. Electronically Signed: By: Lowella Grip III M.D. On: 05/14/2017 10:34   Dg Hand Complete Left  Result Date: 05/14/2017 CLINICAL DATA:  Left hand pain.  History of rheumatoid arthritis. EXAM: LEFT HAND - COMPLETE 3+ VIEW COMPARISON:  None. FINDINGS: No acute fracture or dislocation. There are erosive changes involving the proximal pole of the scaphoid, the lunate, the triquetrum, the third metacarpal head, and the radial aspect of the middle finger middle phalanx head. Joint spaces are relatively preserved. Two well-defined lucent lesions within the thumb distal phalanx with associated thinning of the adjacent cortex. There is no periosteal reaction. Small hooked osteophyte involving the  second metacarpal head. Mild periarticular osteopenia. Mild dorsal hand soft tissue swelling. IMPRESSION: 1. Scattered erosive changes involving the carpal bones as well as the third metacarpal head and middle finger middle phalanx, consistent with history of rheumatoid arthritis. 2. Two more well-defined lucent lesions within the base of the thumb distal phalanx may represent small enchondromas, versus large erosions. Electronically Signed   By: Titus Dubin M.D.   On: 05/14/2017 10:49    I have reviewed the patient's current medications.  Assessment/Plan: 1 ESRD for HD, still xs vol but bp limiting. Have lowered meds 2 Anemia on esa/Fe 3 HPTh vit D 4 Obesity 5 DM 6 Afib on Amio 7 RA P Hd, esa, slowly lower vol    LOS: 2 days   Jeneen Rinks Jamee Pacholski 05/15/2017,10:25 AM

## 2017-05-15 NOTE — Progress Notes (Signed)
Orthopedic Tech Progress Note Patient Details:  LYNDON CHAPEL [birth year] [id number]  Ortho Devices Type of Ortho Device: Velcro wrist forearm splint Ortho Device/Splint Location: bilateral Ortho Device/Splint Interventions: Application   Post Interventions Patient Tolerated: Well Instructions Provided: Care of device   [name] 05/15/2017, 11:51 AM

## 2017-05-16 ENCOUNTER — Inpatient Hospital Stay (HOSPITAL_COMMUNITY): Payer: BLUE CROSS/BLUE SHIELD | Admitting: Physical Therapy

## 2017-05-16 ENCOUNTER — Inpatient Hospital Stay (HOSPITAL_COMMUNITY): Payer: BLUE CROSS/BLUE SHIELD

## 2017-05-16 ENCOUNTER — Other Ambulatory Visit: Payer: Self-pay

## 2017-05-16 LAB — GLUCOSE, CAPILLARY
GLUCOSE-CAPILLARY: 139 mg/dL — AB (ref 65–99)
GLUCOSE-CAPILLARY: 228 mg/dL — AB (ref 65–99)
Glucose-Capillary: 239 mg/dL — ABNORMAL HIGH (ref 65–99)
Glucose-Capillary: 230 mg/dL — ABNORMAL HIGH (ref 65–99)

## 2017-05-16 MED ORDER — AMIODARONE HCL 200 MG PO TABS
200.0000 mg | ORAL_TABLET | Freq: Every day | ORAL | Status: DC
  Administered 2017-05-16 – 2017-06-03 (×19): 200 mg via ORAL
  Filled 2017-05-16 (×19): qty 1

## 2017-05-16 MED ORDER — K PHOS MONO-SOD PHOS DI & MONO 155-852-130 MG PO TABS: 500.0000 mg | ORAL_TABLET | Freq: Two times a day (BID) | ORAL | Status: DC

## 2017-05-16 MED ORDER — POTASSIUM CHLORIDE CRYS ER 20 MEQ PO TBCR
40.0000 meq | EXTENDED_RELEASE_TABLET | Freq: Once | ORAL | Status: AC
  Administered 2017-05-16: 40 meq via ORAL
  Filled 2017-05-16: qty 2

## 2017-05-16 MED ORDER — CAPSAICIN 0.025 % EX CREA
TOPICAL_CREAM | Freq: Two times a day (BID) | CUTANEOUS | Status: DC
  Administered 2017-05-16 – 2017-06-04 (×19): via TOPICAL
  Filled 2017-05-16 (×2): qty 60

## 2017-05-16 MED ORDER — OXYMETAZOLINE HCL 0.05 % NA SOLN
1.0000 | Freq: Two times a day (BID) | NASAL | Status: AC
  Administered 2017-05-16 – 2017-05-17 (×3): 1 via NASAL
  Filled 2017-05-16: qty 15

## 2017-05-16 NOTE — Progress Notes (Signed)
Subjective: Interval History: has complaints sore but strength getting better.  Objective: Vital signs in last 24 hours: Temp:  [97.4 F (36.3 C)-98.1 F (36.7 C)] 97.6 F (36.4 C) (03/03 0144) Pulse Rate:  [76-95] 84 (03/03 0816) Resp:  [15-19] 16 (03/03 0144) BP: (92-139)/(50-85) 109/59 (03/03 0816) SpO2:  [96 %-100 %] 96 % (03/03 0144) Weight:  [126.9 kg (279 lb 12.2 oz)-127.1 kg (280 lb 3.3 oz)] 127.1 kg (280 lb 3.3 oz) (03/03 0505) Weight change: 0.2 kg (7.1 oz)  Intake/Output from previous day: 03/02 0701 - 03/03 0700 In: 340 [P.O.:340] Out: 2000  Intake/Output this shift: Total I/O In: 120 [P.O.:120] Out: -   General appearance: alert, cooperative, no distress and morbidly obese Resp: diminished breath sounds bilaterally Chest wall: IJ PC Cardio: irregularly irregular rhythm and systolic murmur: holosystolic 2/6, blowing at apex GI: Obese, pos bs, liver down 6 cm Extremities: edema 2+  Lab Results: Recent Labs    05/15/17 0921  WBC 29.6*  HGB 9.0*  HCT 26.5*  PLT 337   BMET:  Recent Labs    05/13/17 1230 05/15/17 0921  NA 134* 133*  K 3.5 2.3*  CL 99* 97*  CO2 20* 20*  GLUCOSE 178* 234*  BUN 58* 61*  CREATININE 6.52* 7.38*  CALCIUM 8.3* 8.4*   No results for input(s): PTH in the last 72 hours. Iron Studies: No results for input(s): IRON, TIBC, TRANSFERRIN, FERRITIN in the last 72 hours.  Studies/Results: Mr Knee Right Wo Contrast  Result Date: 05/14/2017 CLINICAL DATA:  Right knee pain and swelling.  No time course given. EXAM: MRI OF THE RIGHT KNEE WITHOUT CONTRAST TECHNIQUE: Multiplanar, multisequence MR imaging of the knee was performed. No intravenous contrast was administered. COMPARISON:  Radiographs 05/14/2017 FINDINGS: MENISCI Medial meniscus:  Intact Lateral meniscus:  Peripheral anterior horn tear. LIGAMENTS Cruciates: Intact. Moderate mucoid degeneration of the ACL and PCL. Collaterals:  Intact.  MCL and pes anserine bursitis. CARTILAGE  Patellofemoral: Markedly age advanced degenerative chondrosis with areas of full-thickness cartilage loss and subchondral cystic change (grade 4 chondromalacia). Medial: Moderate degenerative chondrosis and subchondral cystic changes. Lateral: Moderate degenerative chondrosis and subchondral cystic changes. Joint: Large joint effusion and moderate synovitis. Superior patellar plica are noted. Popliteal Fossa:  No popliteal mass or Baker's cyst. Extensor Mechanism: The patella retinacular structures are intact and the quadriceps and patellar tendons are intact. There is a longitudinal split type tear involving the patellar tendon and significant distal tendinopathy. Bones:  No acute bony findings. Other: Normal knee musculature. IMPRESSION: 1. Peripheral tear involving the anterior horn of the lateral meniscus. 2. Mucoid degeneration of the ACL and PCL. 3. Age advanced tricompartmental degenerative changes most significantly involving the patellofemoral joint. 4. Longitudinal split type tear involving the patellar tendon and significant distal tendinopathy. 5. Large joint effusion and moderate synovitis. Electronically Signed   By: Marijo Sanes M.D.   On: 05/14/2017 14:45   Dg Knee Complete 4 Views Left  Result Date: 05/14/2017 CLINICAL DATA:  Bilateral knee pain, rheumatoid arthritis EXAM: LEFT KNEE - COMPLETE 4+ VIEW COMPARISON:  05/14/2017 FINDINGS: No evidence of fracture, dislocation, or joint effusion. No evidence of arthropathy or other focal bone abnormality. Soft tissues are unremarkable. IMPRESSION: Negative. Electronically Signed   By: Jerilynn Mages.  Shick M.D.   On: 05/14/2017 10:30   Dg Knee Complete 4 Views Right  Addendum Date: 05/14/2017   ADDENDUM REPORT: 05/14/2017 10:37 ADDENDUM: Cystic area in the patella measures 1.4 x 1.0 cm. Electronically Signed  By: Lowella Grip III M.D.   On: 05/14/2017 10:37   Result Date: 05/14/2017 CLINICAL DATA:  Pain EXAM: RIGHT KNEE - COMPLETE 4+ VIEW COMPARISON:   April 26, 2017 and September 05, 2015 FINDINGS: Frontal, bilateral oblique, and lateral views were obtained. There is soft tissue swelling somewhat diffusely. No fracture or dislocation. There is mild lateral patellar subluxation. There is a moderate joint effusion. There is a cystic area in the patella which is unchanged from recent prior study and appears benign. There is no appreciable joint space narrowing. No erosive change. IMPRESSION: Diffuse soft tissue swelling. Evidence of mild lateral patellar subluxation without dislocation. Moderate joint effusion. No fracture. No erosive change. There is no appreciable joint space narrowing. There is a benign appearing cystic area within the patella which appears stable compared to recent study but was not appreciable in 2017. Etiology for this cystic area uncertain. An atypical infectious lesion within the patella conceivably could present in this manner. Given this appearance as well as the apparent joint effusion, it may be prudent to correlate with MR to further assess. These results will be called to the ordering clinician or representative by the Radiologist Assistant, and communication documented in the PACS or zVision Dashboard. Electronically Signed: By: Lowella Grip III M.D. On: 05/14/2017 10:34   Dg Hand Complete Left  Result Date: 05/14/2017 CLINICAL DATA:  Left hand pain.  History of rheumatoid arthritis. EXAM: LEFT HAND - COMPLETE 3+ VIEW COMPARISON:  None. FINDINGS: No acute fracture or dislocation. There are erosive changes involving the proximal pole of the scaphoid, the lunate, the triquetrum, the third metacarpal head, and the radial aspect of the middle finger middle phalanx head. Joint spaces are relatively preserved. Two well-defined lucent lesions within the thumb distal phalanx with associated thinning of the adjacent cortex. There is no periosteal reaction. Small hooked osteophyte involving the second metacarpal head. Mild periarticular  osteopenia. Mild dorsal hand soft tissue swelling. IMPRESSION: 1. Scattered erosive changes involving the carpal bones as well as the third metacarpal head and middle finger middle phalanx, consistent with history of rheumatoid arthritis. 2. Two more well-defined lucent lesions within the base of the thumb distal phalanx may represent small enchondromas, versus large erosions. Electronically Signed   By: Titus Dubin M.D.   On: 05/14/2017 10:49    I have reviewed the patient's current medications.  Assessment/Plan: 1 ESRD  Still vol xs but bp limiting . Will slowly lower. 2 Anemia esa/Fe 3 HPTH vit D 4 DM controlled 5 Afib on amio 6 obesity 7 DJD 8 RA 9 Neuropathy P Hd, esa, Fe. AMio Very debill Rehab    LOS: 3 days   Jeneen Rinks [place] Jr 05/16/2017,9:26 AM

## 2017-05-16 NOTE — Progress Notes (Signed)
Physical Therapy Session Note  Patient Details  Name: John Bailey MRN: [id number] Date of Birth: 03-02-1971  Today's Date: 05/16/2017 PT Individual Time: 6979-4801 and 6553-7482 PT Individual Time Calculation (min): 32 min and 51 min   Short Term Goals: Week 1:  PT Short Term Goal 1 (Week 1): Pt will tolerate 30 min of OOB activity w/o increase in pain level or fatigue PT Short Term Goal 2 (Week 1): Pt will initiate gait training  PT Short Term Goal 3 (Week 1): Pt will transfer bed<>chair via stand pivot w/ RW, Max assist PT Short Term Goal 4 (Week 1): Pt will self-propel w/c 50' w/ min assist PT Short Term Goal 5 (Week 1): Pt will maintain static sitting during functional activity w/ supervision  Skilled Therapeutic Interventions/Progress Updates:  Treatment 1: Pt received in w/c & agreeable to tx. Pt notes 7/10 pain in L wrist but none in B knees. Pt requires total assist to don tennis shoes & L wrist brace. Pt reports he has a glove he normally wears at home & therapist encouraged pt to have someone bring it in for him to use. Pt propels w/c with RUE only and max assist for 50 ft as pt with difficulty grasping drive wheel with L hand. In gym therapist placed blue theraband on wheels to provide increased ability to grip and tactile cuing. Pt then able to propel w/c with BUE for very short distance (~10 ft). Pt returned to room & left in w/c with all needs within reach and set up with meal tray. Pt requires total assist to cut up food, open containers, and place built up handle on fork.   Treatment 2: Pt received in w/c & agreeable to tx, noting pain in B knees but declines asking for pain medication as he reports being premedicated during lunch hour. Pt with BLE & LUE braces donned.  Therapist provides total assist for donning B tennis shoes. Pt propels w/c 10 ft in open, linear hallway with BUE & supervision but then notes L hand pain and ends activity. Transported pt to gym & pt completed  therapeutic exercises consisting of chest press & bicep curls with 1# weighted bar. Pt completed BLE long arc quads but only able to achieve quarter of full ROM 2/2 weakness. Pt also completed BLE hip abduction with orange theraband and hip adduction squeezes. Pt propelled w/c through gym with BLE & RUE with task focusing on BLE strengthening & activity tolerance; pt required multiple rest breaks 2/2 fatigue. Pt reported fatigue and requested to return to room. Pt left sitting in w/c with visitor present & all needs within reach. Pt missed 9 minutes of skilled PT treatment 2/2 fatigue.  Therapy Documentation Precautions:  Precautions Precautions: Fall Restrictions Weight Bearing Restrictions: No   General: PT Amount of Missed Time (min): 9 Minutes PT Missed Treatment Reason: Patient fatigue   See Function Navigator for Current Functional Status.   Therapy/Group: Individual Therapy  Waunita Schooner 05/16/2017, 3:46 PM

## 2017-05-16 NOTE — Progress Notes (Signed)
[facility] PHYSICAL MEDICINE & REHABILITATION     PROGRESS NOTE  Subjective/Complaints:  Left hand feels like freezing, knee pain a little better, received orthoses for both knees  ROS: + Bilateral knee pain. Denies CP, SOB, nausea, vomiting, diarrhea.  Objective: Vital Signs: Blood pressure 116/70, pulse 95, temperature 97.6 F (36.4 C), temperature source Oral, resp. rate 16, weight 127.1 kg (280 lb 3.3 oz), SpO2 96 %. Mr Knee Right Wo Contrast  Result Date: 05/14/2017 CLINICAL DATA:  Right knee pain and swelling.  No time course given. EXAM: MRI OF THE RIGHT KNEE WITHOUT CONTRAST TECHNIQUE: Multiplanar, multisequence MR imaging of the knee was performed. No intravenous contrast was administered. COMPARISON:  Radiographs 05/14/2017 FINDINGS: MENISCI Medial meniscus:  Intact Lateral meniscus:  Peripheral anterior horn tear. LIGAMENTS Cruciates: Intact. Moderate mucoid degeneration of the ACL and PCL. Collaterals:  Intact.  MCL and pes anserine bursitis. CARTILAGE Patellofemoral: Markedly age advanced degenerative chondrosis with areas of full-thickness cartilage loss and subchondral cystic change (grade 4 chondromalacia). Medial: Moderate degenerative chondrosis and subchondral cystic changes. Lateral: Moderate degenerative chondrosis and subchondral cystic changes. Joint: Large joint effusion and moderate synovitis. Superior patellar plica are noted. Popliteal Fossa:  No popliteal mass or Baker's cyst. Extensor Mechanism: The patella retinacular structures are intact and the quadriceps and patellar tendons are intact. There is a longitudinal split type tear involving the patellar tendon and significant distal tendinopathy. Bones:  No acute bony findings. Other: Normal knee musculature. IMPRESSION: 1. Peripheral tear involving the anterior horn of the lateral meniscus. 2. Mucoid degeneration of the ACL and PCL. 3. Age advanced tricompartmental degenerative changes most significantly involving the  patellofemoral joint. 4. Longitudinal split type tear involving the patellar tendon and significant distal tendinopathy. 5. Large joint effusion and moderate synovitis. Electronically Signed   By: Marijo Sanes M.D.   On: 05/14/2017 14:45   Dg Knee Complete 4 Views Left  Result Date: 05/14/2017 CLINICAL DATA:  Bilateral knee pain, rheumatoid arthritis EXAM: LEFT KNEE - COMPLETE 4+ VIEW COMPARISON:  05/14/2017 FINDINGS: No evidence of fracture, dislocation, or joint effusion. No evidence of arthropathy or other focal bone abnormality. Soft tissues are unremarkable. IMPRESSION: Negative. Electronically Signed   By: Jerilynn Mages.  Shick M.D.   On: 05/14/2017 10:30   Dg Knee Complete 4 Views Right  Addendum Date: 05/14/2017   ADDENDUM REPORT: 05/14/2017 10:37 ADDENDUM: Cystic area in the patella measures 1.4 x 1.0 cm. Electronically Signed   By: Lowella Grip III M.D.   On: 05/14/2017 10:37   Result Date: 05/14/2017 CLINICAL DATA:  Pain EXAM: RIGHT KNEE - COMPLETE 4+ VIEW COMPARISON:  April 26, 2017 and September 05, 2015 FINDINGS: Frontal, bilateral oblique, and lateral views were obtained. There is soft tissue swelling somewhat diffusely. No fracture or dislocation. There is mild lateral patellar subluxation. There is a moderate joint effusion. There is a cystic area in the patella which is unchanged from recent prior study and appears benign. There is no appreciable joint space narrowing. No erosive change. IMPRESSION: Diffuse soft tissue swelling. Evidence of mild lateral patellar subluxation without dislocation. Moderate joint effusion. No fracture. No erosive change. There is no appreciable joint space narrowing. There is a benign appearing cystic area within the patella which appears stable compared to recent study but was not appreciable in 2017. Etiology for this cystic area uncertain. An atypical infectious lesion within the patella conceivably could present in this manner. Given this appearance as well as the  apparent joint effusion, it may be  prudent to correlate with MR to further assess. These results will be called to the ordering clinician or representative by the Radiologist Assistant, and communication documented in the PACS or zVision Dashboard. Electronically Signed: By: Lowella Grip III M.D. On: 05/14/2017 10:34   Dg Hand Complete Left  Result Date: 05/14/2017 CLINICAL DATA:  Left hand pain.  History of rheumatoid arthritis. EXAM: LEFT HAND - COMPLETE 3+ VIEW COMPARISON:  None. FINDINGS: No acute fracture or dislocation. There are erosive changes involving the proximal pole of the scaphoid, the lunate, the triquetrum, the third metacarpal head, and the radial aspect of the middle finger middle phalanx head. Joint spaces are relatively preserved. Two well-defined lucent lesions within the thumb distal phalanx with associated thinning of the adjacent cortex. There is no periosteal reaction. Small hooked osteophyte involving the second metacarpal head. Mild periarticular osteopenia. Mild dorsal hand soft tissue swelling. IMPRESSION: 1. Scattered erosive changes involving the carpal bones as well as the third metacarpal head and middle finger middle phalanx, consistent with history of rheumatoid arthritis. 2. Two more well-defined lucent lesions within the base of the thumb distal phalanx may represent small enchondromas, versus large erosions. Electronically Signed   By: Titus Dubin M.D.   On: 05/14/2017 10:49   Recent Labs    05/15/17 0921  WBC 29.6*  HGB 9.0*  HCT 26.5*  PLT 337   Recent Labs    05/13/17 1230 05/15/17 0921  NA 134* 133*  K 3.5 2.3*  CL 99* 97*  GLUCOSE 178* 234*  BUN 58* 61*  CREATININE 6.52* 7.38*  CALCIUM 8.3* 8.4*   CBG (last 3)  Recent Labs    05/15/17 1628 05/15/17 2131 05/16/17 0627  GLUCAP 194* 167* 139*    Wt Readings from Last 3 Encounters:  05/16/17 127.1 kg (280 lb 3.3 oz)  05/13/17 132.1 kg (291 lb 3.6 oz)  04/02/17 (!) 154.9 kg (341 lb  9.6 oz)    Physical Exam:  BP 116/70 (BP Location: Right Arm)   Pulse 95   Temp 97.6 F (36.4 C) (Oral)   Resp 16   Wt 127.1 kg (280 lb 3.3 oz)   SpO2 96%   BMI 38.00 kg/m  Constitutional: Obese male. NAD.  HENT: Normocephalic. Atraumatic. Eyes:EOMare normal. No discharge.  Cardiovascular:RRR. No JVD. Respiratory:Effort normal and breath sounds normal.  GI: Bowel sounds are normal. He exhibitsno distension.  Musc:  Bouchard's deformities  TTP left hand with edema Pain with bilateral knee, edema +Edema b/l LE Neurological: Alert and oriented.  Motor: Right upper extremity 3+/5 proximal to distal (pain inhibition) Left upper extremity: Shoulder abduction, elbow flexion/extension 3 -/5, hand grip 1+/5 (pain inhibition) Bilateral lower extremities: 2 -/5 proximal to distal (some pain inhibition)  Sensation is intact to light touch bilateral upper and lower limbs Skin. LE ischemic changes.  Left hand warm, decreased ROM in MCP and PIP with pain, reduced sensation LT in median nerve distribution Assessment/Plan: 1. Functional deficits secondary to debility which require 3+ hours per day of interdisciplinary therapy in a comprehensive inpatient rehab setting. Physiatrist is providing close team supervision and 24 hour management of active medical problems listed below. Physiatrist and rehab team continue to assess barriers to discharge/monitor patient progress toward functional and medical goals.  Function:  Bathing Bathing position   Position: Sitting EOB  Bathing parts Body parts bathed by patient: Left arm, Chest, Abdomen, Front perineal area, Right upper leg, Left upper leg Body parts bathed by helper: Right arm, Buttocks, Left  lower leg, Right lower leg, Back  Bathing assist Assist Level: 2 helpers(MOD A; 2 helpers sit to stand for buttock hygiene)      Upper Body Dressing/Undressing Upper body dressing   What is the patient wearing?: Pull over shirt/dress      Pull over shirt/dress - Perfomed by patient: Thread/unthread right sleeve, Thread/unthread left sleeve, Put head through opening, Pull shirt over trunk          Upper body assist        Lower Body Dressing/Undressing Lower body dressing   What is the patient wearing?: Underwear, Pants, Shoes   Underwear - Performed by helper: Thread/unthread right underwear leg, Thread/unthread left underwear leg, Pull underwear up/down   Pants- Performed by helper: Thread/unthread right pants leg, Thread/unthread left pants leg, Pull pants up/down           Shoes - Performed by helper: Don/doff right shoe, Don/doff left shoe(slippers)          Lower body assist Assist for lower body dressing: 2 Helpers      Toileting Toileting Toileting activity did not occur: No continent bowel/bladder event        Toileting assist     Transfers Chair/bed transfer Chair/bed transfer activity did not occur: Refused           Locomotion Ambulation Ambulation activity did not occur: Scientist, research (physical sciences) activity did not occur: Refused        Cognition Comprehension Comprehension assist level: Follows complex conversation/direction with no assist  Expression Expression assist level: Expresses complex ideas: With no assist  Social Interaction Social Interaction assist level: Interacts appropriately with others - No medications needed.  Problem Solving Problem solving assist level: Solves complex problems: Recognizes & self-corrects  Memory Memory assist level: Complete Independence: No helper    Medical Problem List and Plan: 1.Debilitysecondary to cardiorenal syndrome, synovitis left hand due to RA   CIR PT, OT, pt resisting therapy due to knee pain , agrees to participate with knee orthoses     2. DVT Prophylaxis/Anticoagulation: Eliquis. Monitor for any bleeding episodes 3. Pain Management:Neurontin 300 mg daily, Robaxin as needed   Voltaren gel added for joint  pain. Neuropathic pain Left hand despite gabapentin will start Zostrix cream .025% 4. Mood:Provide emotional support 5. Neuropsych: This patientiscapable of making decisions on hisown behalf. 6. Skin/Wound Care:Routine skin checks 7. Fluids/Electrolytes/Nutrition:Routine I&O's 8.End-stage renal disease. Hemodialysis initiated Tuesday Thursday Saturday schedule. Patient with left AVFgraft placed 05/10/2017   Appreciate nephro recs. 9.Acute on chronic anemia. Continue Aranesp.    Hb 9.8 on 2/28   Cont to monitor 10.Diabetes mellitus with peripheral neuropathy. Latest hemoglobin A1c 9.2. Diabetic teaching   Lantus insulin 25 units daily at bedtime   NovoLog 3 units 3 times a day.    Monitor with increased mobility 11.Acute on chronic diastolic congestive heart failure. Monitor for any signs of fluid overload. Continue Coreg 6.25mg  BID  Filed Weights   05/15/17 0845 05/15/17 1318 05/16/17 0505  Weight: 129.2 kg (284 lb 13.4 oz) 126.9 kg (279 lb 12.2 oz) 127.1 kg (280 lb 3.3 oz)   12.Rheumatoid arthritis. Prednisone 10 mg daily. Patient had been receiving Abataceptevery Tuesday prior to admission. Added Voltaren gel. Follow-up rheumatologist Dr.Ziokowska   MRI right knee tricompartmental OA, meniscal tear and patellar tendinosis-now has bilateral knee braces 13.Atrial fibrillation RVR. Status post cardioversion. Amiodarone DECREASEto 200 mg daily 3/3 14.Leukocytosis. Felt to be related to prednisone.  no fever   WBCs 28.6 on 2/28, 29K on 3/2   Hand films consistent with RA changes , MRI knee as above, no septic joint suspected   Cont to monitor 15.  Left hand numbness likel carpal tunnel associated with RA vs part of a more diffuse polyneuropathy, given ulnar sparing favor the former will order wrist splint LOS (Days) 3 A FACE TO FACE EVALUATION WAS PERFORMED  Charlett Blake 05/16/2017 7:04 AM

## 2017-05-16 NOTE — Progress Notes (Signed)
Occupational Therapy Session Note  Patient Details  Name: John Bailey MRN: [id number] Date of Birth: 16-Nov-1970  Today's Date: 05/16/2017 OT Individual Time: 3838-1840 OT Individual Time Calculation (min): 57 min    Short Term Goals: Week 1:  OT Short Term Goal 1 (Week 1): Pt will sit to stand wiht 1 caregiver wiht LRAD to decrease caregiver burden OT Short Term Goal 2 (Week 1): Pt will transfer to w/c wiht MAX A of 1 caregiver in prep for [facility] transfer OT Short Term Goal 3 (Week 1): Pt will complete clohting management prior to toileting with MOD A for standing balance OT Short Term Goal 4 (Week 1): Pt will brush teeth without red foam handle to demonstrate improved gross grasp  Skilled Therapeutic Interventions/Progress Updates:    1:1. Pt agreeable to don teds today. OT selects high floor to seat w/c and roho cushion to prevent skin breakdown and improve sit to stand independence/tolerance. OT dons teds and B shoes with total A. Pt wearing B braces on knee for all mobility. Pt supine to sitting with MAX A of 1 and VC for sequencing. Pt stand pivot transfer with RW and L hand splint with MAX A sit to stand and guiding A while stepping laterally to chair. Pt R knee begins to give out and pt guided into seat by OT. Pt reporting 8/10 pain at beginning of session and RN administers medication while OT was selecting w/c. Pt grooms at sink seated in w/c with set up and red foam handle on toothbrush seated far from sink to engage core muscles with anterior trunk flexion. Exited session with pt seated in w/c call light in reach and all needs met.  Therapy Documentation Precautions:  Precautions Precautions: Fall Restrictions Weight Bearing Restrictions: No  See Function Navigator for Current Functional Status.   Therapy/Group: Individual Therapy  Tonny Branch 05/16/2017, 9:48 AM

## 2017-05-16 NOTE — Progress Notes (Signed)
Occupational Therapy Session Note  Patient Details  Name: John Bailey MRN: [id number] Date of Birth: January 20, 1971  Today's Date: 05/16/2017 OT Individual Time: 1300-1345 OT Individual Time Calculation (min): 45 min    Short Term Goals: Week 1:  OT Short Term Goal 1 (Week 1): Pt will sit to stand wiht 1 caregiver wiht LRAD to decrease caregiver burden OT Short Term Goal 2 (Week 1): Pt will transfer to w/c wiht MAX A of 1 caregiver in prep for [facility] transfer OT Short Term Goal 3 (Week 1): Pt will complete clohting management prior to toileting with MOD A for standing balance OT Short Term Goal 4 (Week 1): Pt will brush teeth without red foam handle to demonstrate improved gross grasp  Skilled Therapeutic Interventions/Progress Updates:    1:1. Pt seated in w/c upon entering room. Pt propels w/c in hallway with min A for steering d/t weak grasp of LUE. In tx gym, pt completes 3x20 passes of beakball with emphasis on LUE use for BUE coordination (chest pass/bounce pass) and emphasis on anterior trunk flexion for core work/sit to stand prep. Pt volley beach ball back to OT with 1# dowel rod and Dycem to facilitate LUE grasp 3x30 reps. Pt completes sit to stand at high low table to facilitate further push up from L forearm/hand with MAX A of 1. Pt unable to achieve terminal hip extension despite cues. Exited session with pt seated in w/c with call light in reach and all needs met  Therapy Documentation Precautions:  Precautions Precautions: Fall Restrictions Weight Bearing Restrictions: No General:    See Function Navigator for Current Functional Status.   Therapy/Group: Individual Therapy  Tonny Branch 05/16/2017, 1:26 PM

## 2017-05-17 ENCOUNTER — Inpatient Hospital Stay (HOSPITAL_COMMUNITY): Payer: BLUE CROSS/BLUE SHIELD

## 2017-05-17 ENCOUNTER — Inpatient Hospital Stay (HOSPITAL_COMMUNITY): Payer: BLUE CROSS/BLUE SHIELD | Admitting: Occupational Therapy

## 2017-05-17 DIAGNOSIS — G5602 Carpal tunnel syndrome, left upper limb: Secondary | ICD-10-CM

## 2017-05-17 LAB — GLUCOSE, CAPILLARY
GLUCOSE-CAPILLARY: 275 mg/dL — AB (ref 65–99)
GLUCOSE-CAPILLARY: 364 mg/dL — AB (ref 65–99)
Glucose-Capillary: 162 mg/dL — ABNORMAL HIGH (ref 65–99)
Glucose-Capillary: 264 mg/dL — ABNORMAL HIGH (ref 65–99)

## 2017-05-17 MED ORDER — SODIUM CHLORIDE 0.9 % IV SOLN: 125.0000 mg | INTRAVENOUS | Status: DC

## 2017-05-17 MED ORDER — CALCITRIOL 0.25 MCG PO CAPS
0.5000 ug | ORAL_CAPSULE | ORAL | Status: DC
  Administered 2017-05-18 – 2017-06-03 (×9): 0.5 ug via ORAL
  Filled 2017-05-17: qty 1
  Filled 2017-05-17 (×8): qty 2

## 2017-05-17 MED ORDER — ABATACEPT 125 MG/ML ~~LOC~~ SOSY
125.0000 mg | PREFILLED_SYRINGE | SUBCUTANEOUS | Status: DC
  Administered 2017-05-18 – 2017-06-01 (×3): 125 mg via SUBCUTANEOUS
  Filled 2017-05-17 (×3): qty 1

## 2017-05-17 MED ORDER — INSULIN ASPART 100 UNIT/ML ~~LOC~~ SOLN
6.0000 [IU] | Freq: Three times a day (TID) | SUBCUTANEOUS | Status: DC
  Administered 2017-05-17 – 2017-05-26 (×24): 6 [IU] via SUBCUTANEOUS

## 2017-05-17 NOTE — Progress Notes (Addendum)
[facility] PHYSICAL MEDICINE & REHABILITATION     PROGRESS NOTE  Subjective/Complaints:  Pt seen lying in bed this AM.  Family/friend at bedside.  He states he slept well overnight.  He states states his left hand continues to have pain and that he was started on a ?DMARD but stopped taking it since admission.    ROS: +Left hand pain. Denies CP, SOB, nausea, vomiting, diarrhea.  Objective: Vital Signs: Blood pressure 121/76, pulse 84, temperature 99.1 F (37.3 C), temperature source Oral, resp. rate 16, height 6' (1.829 m), weight 128.6 kg (283 lb 8.2 oz), SpO2 96 %. No results found. Recent Labs    05/15/17 0921  WBC 29.6*  HGB 9.0*  HCT 26.5*  PLT 337   Recent Labs    05/15/17 0921  NA 133*  K 2.3*  CL 97*  GLUCOSE 234*  BUN 61*  CREATININE 7.38*  CALCIUM 8.4*   CBG (last 3)  Recent Labs    05/16/17 1627 05/16/17 2045 05/17/17 0614  GLUCAP 228* 230* 162*    Wt Readings from Last 3 Encounters:  05/17/17 128.6 kg (283 lb 8.2 oz)  05/13/17 132.1 kg (291 lb 3.6 oz)  04/02/17 (!) 154.9 kg (341 lb 9.6 oz)    Physical Exam:  BP 121/76   Pulse 84   Temp 99.1 F (37.3 C) (Oral)   Resp 16   Ht 6' (1.829 m)   Wt 128.6 kg (283 lb 8.2 oz)   SpO2 96%   BMI 38.45 kg/m  Constitutional: Obese male. NAD.  HENT: Normocephalic. Atraumatic. Eyes:EOMare normal. No discharge.  Cardiovascular:RRR. No JVD. Respiratory:Effort normal and breath sounds normal.  GI: Bowel sounds are normal. He exhibitsno distension.  Musc:  Bouchard's deformities  TTP left hand with edema Pain with bilateral knee, edema +Edema b/l LE Neurological: Alert and oriented.  Motor: Right upper extremity 3/5 proximal to distal (pain inhibition) Left upper extremity: Shoulder abduction, elbow flexion/extension 3 -/5, hand grip 1+/5 (pain inhibition) Bilateral lower extremities: 2+/5 proximal to distal (some pain inhibition)  Sensation is intact to light touch bilateral upper and lower  limbs Skin. LE ischemic changes.   Assessment/Plan: 1. Functional deficits secondary to debility which require 3+ hours per day of interdisciplinary therapy in a comprehensive inpatient rehab setting. Physiatrist is providing close team supervision and 24 hour management of active medical problems listed below. Physiatrist and rehab team continue to assess barriers to discharge/monitor patient progress toward functional and medical goals.  Function:  Bathing Bathing position   Position: Sitting EOB  Bathing parts Body parts bathed by patient: Left arm, Chest, Abdomen, Front perineal area, Right upper leg, Left upper leg Body parts bathed by helper: Right arm, Buttocks, Left lower leg, Right lower leg, Back  Bathing assist Assist Level: 2 helpers(MOD A; 2 helpers sit to stand for buttock hygiene)      Upper Body Dressing/Undressing Upper body dressing   What is the patient wearing?: Pull over shirt/dress     Pull over shirt/dress - Perfomed by patient: Thread/unthread right sleeve, Thread/unthread left sleeve, Put head through opening, Pull shirt over trunk          Upper body assist        Lower Body Dressing/Undressing Lower body dressing   What is the patient wearing?: Underwear, Pants, Shoes   Underwear - Performed by helper: Thread/unthread right underwear leg, Thread/unthread left underwear leg, Pull underwear up/down   Pants- Performed by helper: Thread/unthread right pants leg, Thread/unthread left pants  leg, Pull pants up/down           Shoes - Performed by helper: Don/doff right shoe, Don/doff left shoe(slippers)          Lower body assist Assist for lower body dressing: 2 Helpers      Toileting Toileting Toileting activity did not occur: No continent bowel/bladder event        Toileting assist     Transfers Chair/bed transfer Chair/bed transfer activity did not occur: Refused           Locomotion Ambulation Ambulation activity did not occur:  Scientist, research (physical sciences) activity did not occur: Refused Type: Manual Max wheelchair distance: 30 ft (BLE, RUE) Assist Level: Supervision or verbal cues  Cognition Comprehension Comprehension assist level: Follows complex conversation/direction with no assist  Expression Expression assist level: Expresses complex ideas: With no assist  Social Interaction Social Interaction assist level: Interacts appropriately with others - No medications needed.  Problem Solving Problem solving assist level: Solves complex problems: Recognizes & self-corrects  Memory Memory assist level: Complete Independence: No helper    Medical Problem List and Plan: 1.Debilitysecondary to cardiorenal syndrome, synovitis left hand due to RA   Cont CIR      Weekend notes reviewed 2. DVT Prophylaxis/Anticoagulation: Eliquis. Monitor for any bleeding episodes 3. Pain Management:Neurontin 300 mg daily, Robaxin as needed   Voltaren gel added for joint pain.   Neuropathic pain Left hand - cont gabapentin, started on capsasin cream .025% 4. Mood:Provide emotional support 5. Neuropsych: This patientiscapable of making decisions on hisown behalf. 6. Skin/Wound Care:Routine skin checks 7. Fluids/Electrolytes/Nutrition:Routine I&O's 8.End-stage renal disease. Hemodialysis initiated Tuesday Thursday Saturday schedule. Patient with left AVFgraft placed 05/10/2017   Appreciate nephro recs. 9.Acute on chronic anemia. Continue Aranesp.    Hb 9.0 on 3/2   Cont to monitor 10.Diabetes mellitus with peripheral neuropathy. Latest hemoglobin A1c 9.2. Diabetic teaching   Lantus insulin 25 units daily at bedtime   NovoLog 3 units 3 times a day, increased to 6 on 3/2.    Monitor with increased mobility 11.Acute on chronic diastolic congestive heart failure. Monitor for any signs of fluid overload. Continue Coreg 6.25mg  BID  Filed Weights   05/16/17 0505 05/17/17 0407 05/17/17 0436  Weight: 127.1 kg  (280 lb 3.3 oz) 128.6 kg (283 lb 8.2 oz) 128.6 kg (283 lb 8.2 oz)   12.Rheumatoid arthritis. Prednisone 10 mg daily. Patient had been receiving Abataceptevery Tuesday prior to admission. Added Voltaren gel. Follow-up rheumatologist Dr.Ziokowska   MRI right knee reviewed, showing OA, meniscal tear and patellar tendinosis on right    Will speak with Ortho again   Will discuss restarting RA meds  13.Atrial fibrillation RVR. Status post cardioversion. Amiodarone decreased to 200 mg daily 3/3 14.Leukocytosis. Felt to be related to prednisone.    Afebrile   WBCs 28.6 on 2/28, 29K on 3/2   Hand films reviewed, suggesting RA changes   Cont to monitor 15.  Left hand numbness likel carpal tunnel associated with RA vs part of a more diffuse polyneuropathy, given ulnar sparing favor the former wrist splint ordered  LOS (Days) 4 A FACE TO FACE EVALUATION WAS PERFORMED  Jacolby Risby Lorie Phenix 05/17/2017 9:16 AM

## 2017-05-17 NOTE — Progress Notes (Signed)
Physical Therapy Session Note  Patient Details  Name: John Bailey MRN: [id number] Date of Birth: 01-02-1971  Today's Date: 05/17/2017 PT Individual Time: 0802-0901 PT Individual Time Calculation (min): 59 min   Short Term Goals: Week 1:  PT Short Term Goal 1 (Week 1): Pt will tolerate 30 min of OOB activity w/o increase in pain level or fatigue PT Short Term Goal 2 (Week 1): Pt will initiate gait training  PT Short Term Goal 3 (Week 1): Pt will transfer bed<>chair via stand pivot w/ RW, Max assist PT Short Term Goal 4 (Week 1): Pt will self-propel w/c 50' w/ min assist PT Short Term Goal 5 (Week 1): Pt will maintain static sitting during functional activity w/ supervision  Skilled Therapeutic Interventions/Progress Updates:    Pt supine in bed upon PT arrival, agreeable to therapy tx and reports 8/10 pain in B ankles, and L UE, just received pain beds. Therapist performed gentle ankle/knee ROM bilaterally for pain relief and stretching x 5 min. Total assist to don shorts, pt rolling in both directions with max assist using bed rails, total assist to pull shorts over hips. Total assist to don B LE braces and don shoes. Pt transferred from supine>sitting EOB with max assist, verbal cues for techniques and manual facilitation for weightshifting. Pt attempted to perform sit<>stand from elevated bed with max assist +2, pt able to fully come off the bed but unable to fully extend knees and hips for upright posture despite assist and cues. Therapist determined pt unsafe to perform stand pivot transfer this session secondary to poor standing tolerance. Pt performed lateral scoot transfer with slideboard and +2 assist, manual facilitation for weightshifting and verbal cues for techniques. Pt performed seated therex for strengthening: 2 x 10 LAQ and 2 x 10 hip flexion. Pt performed AAROM for shoulder flexion and abduction. Pt left seated in w/c with needs in reach.   Therapy Documentation Precautions:   Precautions Precautions: Fall Restrictions Weight Bearing Restrictions: No   See Function Navigator for Current Functional Status.   Therapy/Group: Individual Therapy  Netta Corrigan, PT, DPT 05/17/2017, 9:42 AM

## 2017-05-17 NOTE — Progress Notes (Signed)
Occupational Therapy Session Note  Patient Details  Name: John Bailey MRN: [id number] Date of Birth: 01/12/1971  Today's Date: 05/17/2017 OT Individual Time: 9518-8416 OT Individual Time Calculation (min): 74 min    Short Term Goals: Week 1:  OT Short Term Goal 1 (Week 1): Pt will sit to stand wiht 1 caregiver wiht LRAD to decrease caregiver burden OT Short Term Goal 2 (Week 1): Pt will transfer to w/c wiht MAX A of 1 caregiver in prep for [facility] transfer OT Short Term Goal 3 (Week 1): Pt will complete clohting management prior to toileting with MOD A for standing balance OT Short Term Goal 4 (Week 1): Pt will brush teeth without red foam handle to demonstrate improved gross grasp  Skilled Therapeutic Interventions/Progress Updates:    Pt presents sitting up in w/c agreeable to OT tx session. Pt reports increased pain/sensitiviey in bil feet, declines pain meds at this time, reporting feeling somewhat sleepy from earlier pain meds. Pt particpating in bil UE stretching/A/AAROM across planes including shoulder internal/external rotation, shoulder flexion, abduction, and horizontal abduction. Pt requiring increased assist for LUE, with additional focus on stretching LUE as pt with tendency to utilize compensatory shoulder movements when moving through UE range. Pt completing AAROM to bil LEs including knee extension and marching in place with pt able to march LEs within small range. Pt completing all motions approx 10x each and with rest breaks throughout. Pt utilized 2lb dowel rod and 1kg weighted ball for further UB strengthening/endurance including chest press and bicep curls for 10x each exercise. Pt requesting transfer back to bed prior to end of session. Pt completing slide board transfer to slightly lower surface with MaxA and education/cues provided for head hips relationship during transfer completion. ModA for LE management to return to supine in bed, Pt completing bridging x2 while supine for  further positioning of LB in bed with therapist placing LEs in flexion and providing maxA to scoot hips during bridging. Pt provided with setup assist to eat lunch with assist to open containers and apply condiments. Pt left in bed with HOB raise to sitting position, bed alarm activated, call bell and needs within reach.     Therapy Documentation Precautions:  Precautions Precautions: Fall Precaution Comments: Severe RA in shoulders, left hand Required Braces or Orthoses: Other Brace/Splint Other Brace/Splint: Bilateral hinged knee braces for comfort and support. Restrictions Weight Bearing Restrictions: No    See Function Navigator for Current Functional Status.   Therapy/Group: Individual Therapy  [name] 05/17/2017, 4:03 PM

## 2017-05-17 NOTE — Progress Notes (Signed)
Occupational Therapy Session Note  Patient Details  Name: John Bailey MRN: [id number] Date of Birth: 1971-02-17  Today's Date: 05/17/2017 OT Individual Time: 1000-1106 OT Individual Time Calculation (min): 66 min    Short Term Goals: Week 1:  OT Short Term Goal 1 (Week 1): Pt will sit to stand wiht 1 caregiver wiht LRAD to decrease caregiver burden OT Short Term Goal 2 (Week 1): Pt will transfer to w/c wiht MAX A of 1 caregiver in prep for [facility] transfer OT Short Term Goal 3 (Week 1): Pt will complete clohting management prior to toileting with MOD A for standing balance OT Short Term Goal 4 (Week 1): Pt will brush teeth without red foam handle to demonstrate improved gross grasp  Skilled Therapeutic Interventions/Progress Updates:   Pt completed grooming tasks and UB bathing and dressing at the sink.  Increased pain in his left hand with all functional use.  He needed mod assist for washing the RUE and underarm during session.  He could wash the left arm, underarm and chest/abdomen region with supervision and increased time.  Pt attempts to use the LUE for gross assist for opening deodorant and liquid soap, however his AROM in the hand is severely limited to mostly 40% thumb adduction.  He needed mod assist for donning pullover shirt with mod assist for application of deodorant prior to this.  Next had pt work on sit to stand and standing with use of the RW.  Applied platform to the left side of the walker for support of the arm.  Total assist needed to complete sit to stand and maintain standing for 20 seconds.  Flexed posture in standing noted in trunk and in bilateral knees.  Finished session with assisting pt with doffing shoes and donning slippers as he would not have further therapy until later in the day.  Finished session with pt in the wheelchair at bedside and call button and phone in reach.       Therapy Documentation Precautions:  Precautions Precautions: Fall Precaution Comments:  Severe RA in shoulders, left hand Required Braces or Orthoses: Other Brace/Splint Other Brace/Splint: Bilateral hinged knee braces for comfort and support. Restrictions Weight Bearing Restrictions: No   Pain: Pain Assessment Pain Assessment: Faces Pain Score: 2  Faces Pain Scale: Hurts even more Pain Type: Chronic pain Pain Location: Hand Pain Orientation: Left Pain Descriptors / Indicators: Discomfort;Grimacing;Aching Pain Onset: On-going Pain Intervention(s): Repositioned ADL: See Function Navigator for Current Functional Status.   Therapy/Group: Individual Therapy  Nevaeha Finerty OTR/L 05/17/2017, 12:22 PM

## 2017-05-17 NOTE — Progress Notes (Addendum)
[facility] Rounding Note  Subjective: Mostly c/o some joint pain (ankles) Working with PT slow progress  Objective: Vital signs in last 24 hours: Temp:  [98.9 F (37.2 C)-99.1 F (37.3 C)] 99.1 F (37.3 C) (03/04 0436) Pulse Rate:  [79-84] 84 (03/04 0436) Resp:  [16] 16 (03/04 0436) BP: (102-121)/(67-76) 121/76 (03/04 0436) SpO2:  [96 %-98 %] 96 % (03/04 0407) Weight:  [128.6 kg (283 lb 8.2 oz)] 128.6 kg (283 lb 8.2 oz) (03/04 0436) Weight change: -0.6 kg (-5.2 oz)  Intake/Output from previous day: 03/03 0701 - 03/04 0700 In: 75 [P.O.:460] Out: -  Intake/Output this shift: Total I/O In: 120 [P.O.:120] Out: -   Alert, cooperative, no distress and morbidly obese Resp: diminished breath sounds bilaterally R IJ TDC (2/19) dry dressing Irregularly irregular S1S2 No S3 + 2/6 murmur LSB Obese, pos bs, liver down 6 cm Wearing hinged leg braces and working with therapies L AVF + bruit (2/25)   Recent Labs    05/15/17 0921  WBC 29.6*  HGB 9.0*  HCT 26.5*  PLT 337    Recent Labs    05/15/17 0921  NA 133*  K 2.3*  CL 97*  CO2 20*  GLUCOSE 234*  BUN 61*  CREATININE 7.38*  CALCIUM 8.4*   Medications . [START ON 05/18/2017] Abatacept  125 mg Subcutaneous Q Tue  . amiodarone  200 mg Oral Daily  . apixaban  5 mg Oral BID  . calcitRIOL  0.5 mcg Oral QODAY  . capsaicin   Topical BID  . [START ON 05/18/2017] darbepoetin (ARANESP) injection - DIALYSIS  100 mcg Intravenous Q Tue-HD  . fluticasone  2 spray Each Nare Daily  . gabapentin  300 mg Oral Daily  . insulin aspart  0-15 Units Subcutaneous TID WC  . insulin aspart  6 Units Subcutaneous TID WC  . insulin glargine  25 Units Subcutaneous QHS  . multivitamin  1 tablet Oral QHS  . oxymetazoline  1 spray Each Nare BID  . predniSONE  10 mg Oral Q breakfast   I have reviewed the patient's current medications.  Background: PMH systolic HF EF 37%, DM2, HLD, RA, known CKD. HD initiated this admisson for renal failure,  massive  vol overload. Access TDC placed 2/19, L RC AVF 05/10/17. CLIPPED to [place] [facility] On rehab d/t severe deconditioning.     Assessment/Plan:  1. ESRD  Still vol xs but bp limiting . Will slowly lower. Next HD on Tuesday. (Has MWF spot as outpt but will keep on TTS here until closer to D/C so as not to interfere with as much rehab) 2. Anemia Darbe 100 QTues 3. HPTH calcitriol QOD - change to HD TTS. PTH 230 on 05/11/17 4. DM controlled 5. Afib on amio/apixaban 6. Obesity 7. DJD 8. RA - on pred 10 9. Neuropathy   Jamal Maes, MD [place] [phone] Pager 05/17/2017, 2:05 PM

## 2017-05-18 ENCOUNTER — Inpatient Hospital Stay (HOSPITAL_COMMUNITY): Payer: BLUE CROSS/BLUE SHIELD | Admitting: Physical Therapy

## 2017-05-18 ENCOUNTER — Inpatient Hospital Stay (HOSPITAL_COMMUNITY): Payer: BLUE CROSS/BLUE SHIELD | Admitting: Occupational Therapy

## 2017-05-18 ENCOUNTER — Inpatient Hospital Stay (HOSPITAL_COMMUNITY): Payer: BLUE CROSS/BLUE SHIELD

## 2017-05-18 DIAGNOSIS — R0989 Other specified symptoms and signs involving the circulatory and respiratory systems: Secondary | ICD-10-CM

## 2017-05-18 DIAGNOSIS — R7309 Other abnormal glucose: Secondary | ICD-10-CM

## 2017-05-18 LAB — CBC
HEMATOCRIT: 25.1 % — AB (ref 39.0–52.0)
Hemoglobin: 8.8 g/dL — ABNORMAL LOW (ref 13.0–17.0)
MCH: 27.8 pg (ref 26.0–34.0)
MCHC: 35.1 g/dL (ref 30.0–36.0)
MCV: 79.2 fL (ref 78.0–100.0)
PLATELETS: 350 10*3/uL (ref 150–400)
RBC: 3.17 MIL/uL — ABNORMAL LOW (ref 4.22–5.81)
RDW: 19 % — AB (ref 11.5–15.5)
WBC: 30 10*3/uL — ABNORMAL HIGH (ref 4.0–10.5)

## 2017-05-18 LAB — RENAL FUNCTION PANEL
Albumin: 2.2 g/dL — ABNORMAL LOW (ref 3.5–5.0)
Anion gap: 15 (ref 5–15)
BUN: 75 mg/dL — AB (ref 6–20)
CHLORIDE: 95 mmol/L — AB (ref 101–111)
CO2: 20 mmol/L — AB (ref 22–32)
Calcium: 8.9 mg/dL (ref 8.9–10.3)
Creatinine, Ser: 7.92 mg/dL — ABNORMAL HIGH (ref 0.61–1.24)
GFR calc Af Amer: 9 mL/min — ABNORMAL LOW (ref >60)
GFR calc non Af Amer: 7 mL/min — ABNORMAL LOW (ref >60)
Glucose, Bld: 190 mg/dL — ABNORMAL HIGH (ref 65–99)
POTASSIUM: 3 mmol/L — AB (ref 3.5–5.1)
Phosphorus: 5 mg/dL — ABNORMAL HIGH (ref 2.5–4.6)
Sodium: 130 mmol/L — ABNORMAL LOW (ref 135–145)

## 2017-05-18 LAB — GLUCOSE, CAPILLARY
Glucose-Capillary: 108 mg/dL — ABNORMAL HIGH (ref 65–99)
Glucose-Capillary: 108 mg/dL — ABNORMAL HIGH (ref 65–99)
Glucose-Capillary: 181 mg/dL — ABNORMAL HIGH (ref 65–99)

## 2017-05-18 MED ORDER — DARBEPOETIN ALFA 100 MCG/0.5ML IJ SOSY
PREFILLED_SYRINGE | INTRAMUSCULAR | Status: AC
  Filled 2017-05-18: qty 0.5

## 2017-05-18 MED ORDER — CALCITRIOL 0.5 MCG PO CAPS
ORAL_CAPSULE | ORAL | Status: AC
  Filled 2017-05-18: qty 1

## 2017-05-18 MED ORDER — LIDOCAINE 5 % EX PTCH
2.0000 | MEDICATED_PATCH | CUTANEOUS | Status: DC
  Administered 2017-05-18 – 2017-05-19 (×2): 2 via TRANSDERMAL
  Filled 2017-05-18 (×4): qty 2

## 2017-05-18 NOTE — Progress Notes (Signed)
Physical Therapy Session Note  Patient Details  Name: John Bailey MRN: [id number] Date of Birth: 13-Apr-1970  Today's Date: 05/18/2017 PT Individual Time: 1100-1155 PT Individual Time Calculation (min): 55 min   Short Term Goals: Week 1:  PT Short Term Goal 1 (Week 1): Pt will tolerate 30 min of OOB activity w/o increase in pain level or fatigue PT Short Term Goal 2 (Week 1): Pt will initiate gait training  PT Short Term Goal 3 (Week 1): Pt will transfer bed<>chair via stand pivot w/ RW, Max assist PT Short Term Goal 4 (Week 1): Pt will self-propel w/c 50' w/ min assist PT Short Term Goal 5 (Week 1): Pt will maintain static sitting during functional activity w/ supervision  Skilled Therapeutic Interventions/Progress Updates:    Pt supine in bed upon PT arrival, agreeable to therapy tx however is refusing all OOB/weightbearin activity through LEs secondary to pain 10/10 in feet/ankles. Pt agreeable to supine therex for strengthening, 2 x 10 each: ankle circles, SAQ, assisted SLR, assisted heel slides, adductor squeezes, assisted hip abduction. Pt transferred from supine>sitting EOB with min assist, use of bedrails and HOB elevated, verbal cues for techniques. Pt worked on seated balance EOB and LE strengthening to perform 2 x 10 LAQ and x 20 seated marches. Pt performed UE exercises while seated EOB: shoulder flexion AROM, shoulder abduction AROM, bicep curls, shoulder blade squeezes. Pt transferred back to supine with mod assist, left supine with needs in reach.   Therapy Documentation Precautions:  Precautions Precautions: Fall Precaution Comments: Severe RA in shoulders, left hand Required Braces or Orthoses: Other Brace/Splint Other Brace/Splint: Bilateral hinged knee braces for comfort and support. Restrictions Weight Bearing Restrictions: No   See Function Navigator for Current Functional Status.   Therapy/Group: Individual Therapy  Netta Corrigan, PT, DPT 05/18/2017, 7:56  AM

## 2017-05-18 NOTE — Procedures (Signed)
I have personally attended this patient's dialysis session.  2K bath UF goal 3.5 liters BP holding up so far Pre weight 130.2 Goal weight this time 127 kg  Jamal Maes, MD [place] Pager 05/18/2017, 2:05 PM

## 2017-05-18 NOTE — Progress Notes (Addendum)
Physical Therapy Session Note  Patient Details  Name: John Bailey MRN: [id number] Date of Birth: [birth year]  Today's Date: 05/18/2017 PT Individual Time: 0800-0905 PT Individual Time Calculation (min): 65 min   Short Term Goals: Week 1:  PT Short Term Goal 1 (Week 1): Pt will tolerate 30 min of OOB activity w/o increase in pain level or fatigue PT Short Term Goal 2 (Week 1): Pt will initiate gait training  PT Short Term Goal 3 (Week 1): Pt will transfer bed<>chair via stand pivot w/ RW, Max assist PT Short Term Goal 4 (Week 1): Pt will self-propel w/c 50' w/ min assist PT Short Term Goal 5 (Week 1): Pt will maintain static sitting during functional activity w/ supervision  Skilled Therapeutic Interventions/Progress Updates:   Pt in supine and agreeable to therapy, c/o foot pain as detailed below in bilateral feet. Agreeable to therapy. Transferred to EOB w/ min assist and heavy use of bedrails, pt insisting on performing transfer mostly himself. Maintained static sitting w/ supervision for 20+ minutes while eating breakfast, however only took a few bites of yogurt. Pt actively working on pain relief breathing strategies during this time as placing feet on ground was increasing foot pain. Educated pt on gradually building up tolerance to WB through feet and spending time upright throughout the day w/ feet planted on ground is therapeutic. Pt in agreement. Performed transfer to w/c via slide board, total assist x1. Pt required total assist for weight-shifting across board and max cues for technique. When almost to w/c, pt began sliding forward towards edge of board during a scoot, attempted to scoot back w/ max assist to block LEs, however pt unable to scoot 2/2 pain in LEs/feet. Lowered pt to therapist's foot and then onto floor to prevent fall off of w/c/board as pt was continuing to slide. Pt denied pain on bottom when sitting on floor, BP 96/54, pt denied dizziness. Used maximove to transfer  back to supine, BP 113/75 in supine. Pt performed multiple bouts of rolling w/ max assist to change underwear/pants as pt reported he had soiled them, RN present performing pericare. Ended session in supine, call bell within reach and all needs met.   Therapy Documentation Precautions:  Precautions Precautions: Fall Precaution Comments: Severe RA in shoulders, left hand Required Braces or Orthoses: Other Brace/Splint Other Brace/Splint: Bilateral hinged knee braces for comfort and support. Restrictions Weight Bearing Restrictions: No Vital Signs: Therapy Vitals Temp: 98.5 F (36.9 C) Temp Source: Axillary Pulse Rate: 81 Resp: 18 BP: 113/75 Patient Position (if appropriate): Lying Oxygen Therapy SpO2: 97 % O2 Device: Room Air Pain: Pain Assessment Pain Assessment: 0-10 Pain Score: 9  Pain Type: Chronic pain Pain Location: Foot Pain Orientation: Right;Left Pain Descriptors / Indicators: Burning;Aching;Tender Pain Onset: On-going  See Function Navigator for Current Functional Status.   Therapy/Group: Individual Therapy  Milly Goggins K Arnette 05/18/2017, 9:07 AM

## 2017-05-18 NOTE — Progress Notes (Signed)
Occupational Therapy Session Note  Patient Details  Name: John Bailey MRN: [id number] Date of Birth: 03/10/1971  Today's Date: 05/18/2017 OT Individual Time: 0920-1003 OT Individual Time Calculation (min): 43 min    Short Term Goals: Week 1:  OT Short Term Goal 1 (Week 1): Pt will sit to stand wiht 1 caregiver wiht LRAD to decrease caregiver burden OT Short Term Goal 2 (Week 1): Pt will transfer to w/c wiht MAX A of 1 caregiver in prep for [facility] transfer OT Short Term Goal 3 (Week 1): Pt will complete clohting management prior to toileting with MOD A for standing balance OT Short Term Goal 4 (Week 1): Pt will brush teeth without red foam handle to demonstrate improved gross grasp  Skilled Therapeutic Interventions/Progress Updates:    Pt greeted semi-reclined in bed, stating he was in a lot of pain in his feet and declined transfer OOB. Agreeable to bed level there-ex. OT modified there-band and added foam grip 2/2 arthritic and painful grasp. Pt completed 3 stes of 10 triceps press and bicep curl with increased time and rest breaks between sets. Bed brought into trendelenburg and pt able to push some with LEs to come towards [facility]. Pt left semi-reclined in bed with nursing present and needs met.   Therapy Documentation Precautions:  Precautions Precautions: Fall Precaution Comments: Severe RA in shoulders, left hand Required Braces or Orthoses: Other Brace/Splint Other Brace/Splint: Bilateral hinged knee braces for comfort and support. Restrictions Weight Bearing Restrictions: No General: General OT Amount of Missed Time: 17 Minutes Pain: Pain Assessment Pain Assessment: 0-10 Pain Score: 9  Pain Type: Chronic pain Pain Location: Foot Pain Orientation: Right;Left Pain Descriptors / Indicators: Burning;Aching;Tender Pain Onset: On-going  See Function Navigator for Current Functional Status.   Therapy/Group: Individual Therapy  Valma Cava 05/18/2017, 10:08 AM

## 2017-05-18 NOTE — Progress Notes (Signed)
[facility]IC[facility]ILITATION     PROGRESS NOTE  Subjective/Complaints:  Pt seen lying in bed this AM.  He states he slept well overnight.  He complains of diabetic neuropathy.   ROS: +Diabetic Neuropathy. Denies CP, SOB, nausea, vomiting, diarrhea.  Objective: Vital Signs: Blood pressure 113/73, pulse 81, temperature 98.5 F (36.9 C), temperature source Axillary, resp. rate 18, height 6' (1.829 m), weight 130.2 kg (287 lb 0.6 oz), SpO2 97 %. No results found. Recent Labs    05/15/17 0921  WBC 29.6*  HGB 9.0*  HCT 26.5*  PLT 337   Recent Labs    05/15/17 0921  NA 133*  K 2.3*  CL 97*  GLUCOSE 234*  BUN 61*  CREATININE 7.38*  CALCIUM 8.4*   CBG (last 3)  Recent Labs    05/17/17 1628 05/17/17 2112 05/18/17 0649  GLUCAP 364* 264* 108*    Wt Readings from Last 3 Encounters:  05/18/17 130.2 kg (287 lb 0.6 oz)  05/13/17 132.1 kg (291 lb 3.6 oz)  04/02/17 (!) 154.9 kg (341 lb 9.6 oz)    Physical Exam:  BP 113/73 (BP Location: Right Arm)   Pulse 81   Temp 98.5 F (36.9 C) (Axillary)   Resp 18   Ht 6' (1.829 m)   Wt 130.2 kg (287 lb 0.6 oz)   SpO2 97%   BMI 38.93 kg/m  Constitutional: Obese male. NAD.  HENT: Normocephalic. Atraumatic. Eyes:EOMare normal. No discharge.  Cardiovascular:RRR. No JVD. Respiratory:Effort normal and breath sounds normal.  GI: Bowel sounds are normal. He exhibitsno distension.  Musc:  Bouchard's deformities  TTP left hand with edema Pain with bilateral knee, edema +Edema b/l LE Neurological: Alert and oriented.  Motor: Right upper extremity 3/5 proximal to distal (pain inhibition) Left upper extremity: Shoulder abduction, elbow flexion/extension 3 -/5, hand grip 2-/5 (pain inhibition) Bilateral lower extremities: 2+/5 proximal to distal (some pain inhibition)  Sensation is intact to light touch bilateral upper and lower limbs Skin. LE ischemic changes.   Assessment/Plan: 1. Functional deficits secondary  to debility which require 3+ hours per day of interdisciplinary therapy in a comprehensive inpatient rehab setting. Physiatrist is providing close team supervision and 24 hour management of active medical problems listed below. Physiatrist and rehab team continue to assess barriers to discharge/monitor patient progress toward functional and medical goals.  Function:  Bathing Bathing position   Position: Sitting EOB  Bathing parts Body parts bathed by patient: Left arm, Chest, Abdomen Body parts bathed by helper: Right arm, Back  Bathing assist Assist Level: 2 helpers(MOD A; 2 helpers sit to stand for buttock hygiene)      Upper Body Dressing/Undressing Upper body dressing   What is the patient wearing?: Pull over shirt/dress     Pull over shirt/dress - Perfomed by patient: Thread/unthread right sleeve, Thread/unthread left sleeve Pull over shirt/dress - Perfomed by helper: Put head through opening, Pull shirt over trunk        Upper body assist Assist Level: Supervision or verbal cues(per OT note)      Lower Body Dressing/Undressing Lower body dressing Lower body dressing/undressing activity did not occur: N/A What is the patient wearing?: Underwear, Pants, Shoes   Underwear - Performed by helper: Thread/unthread right underwear leg, Thread/unthread left underwear leg, Pull underwear up/down   Pants- Performed by helper: Thread/unthread right pants leg, Thread/unthread left pants leg, Pull pants up/down           Shoes - Performed by helper: Don/doff  right shoe, Don/doff left shoe(slippers)          Lower body assist Assist for lower body dressing: 2 Helpers      Toileting Toileting     Toileting steps completed by helper: Adjust clothing prior to toileting, Performs perineal hygiene, Adjust clothing after toileting(per Candice Applewhite, NT)    Toileting assist     Transfers Chair/bed transfer Chair/bed transfer activity did not occur: Refused Chair/bed  transfer method: Lateral scoot Chair/bed transfer assist level: 2 helpers Chair/bed transfer assistive device: Armrests, Sliding board Mechanical lift: Stedy   Locomotion Ambulation Ambulation activity did not occur: Scientist, research (physical sciences) activity did not occur: Refused Type: Manual Max wheelchair distance: 10 ft Assist Level: Supervision or verbal cues  Cognition Comprehension Comprehension assist level: Follows complex conversation/direction with no assist  Expression Expression assist level: Expresses complex ideas: With no assist  Social Interaction Social Interaction assist level: Interacts appropriately with others - No medications needed.  Problem Solving Problem solving assist level: Solves complex problems: Recognizes & self-corrects  Memory Memory assist level: Complete Independence: No helper    Medical Problem List and Plan: 1.Debilitysecondary to cardiorenal syndrome, synovitis left hand due to RA   Cont CIR    2. DVT Prophylaxis/Anticoagulation: Eliquis. Monitor for any bleeding episodes 3. Pain Management:Neurontin 300 mg daily, Robaxin as needed   Voltaren gel added for joint pain.   Neuropathic pain Left hand - cont gabapentin, started on capsasin cream .025%   Lidoderm patch added on 3/5 4. Mood:Provide emotional support 5. Neuropsych: This patientiscapable of making decisions on hisown behalf. 6. Skin/Wound Care:Routine skin checks 7. Fluids/Electrolytes/Nutrition:Routine I&O's 8.End-stage renal disease. Hemodialysis initiated Tuesday Thursday Saturday schedule. Patient with left AVFgraft placed 05/10/2017   Appreciate nephro recs. 9.Acute on chronic anemia. Continue Aranesp.    Hb 9.0 on 3/2   Cont to monitor 10.Diabetes mellitus with peripheral neuropathy. Latest hemoglobin A1c 9.2. Diabetic teaching   Lantus insulin 25 units daily at bedtime   NovoLog 3 units 3 times a day, increased to 6 on 3/4.    Monitor with increased  mobility   Remains extremely labile, will monitor for trend 11.Acute on chronic diastolic congestive heart failure. Monitor for any signs of fluid overload. Continue Coreg 6.25mg  BID  Filed Weights   05/17/17 0407 05/17/17 0436 05/18/17 0646  Weight: 128.6 kg (283 lb 8.2 oz) 128.6 kg (283 lb 8.2 oz) 130.2 kg (287 lb 0.6 oz)   12.Rheumatoid arthritis. Prednisone 10 mg daily. Patient had been receiving Abataceptevery Tuesday prior to admission. Added Voltaren gel. Follow-up rheumatologist Dr.Ziokowska   MRI right knee reviewed, showing OA, meniscal tear and patellar tendinosis on right.  Per Ortho, braces PRN   Will resume RA meds  13.Atrial fibrillation RVR. Status post cardioversion. Amiodarone decreased to 200 mg daily 3/3 14.Leukocytosis. Felt to be related to prednisone.    Afebrile   WBCs 29.6 on 3/2   Hand films reviewed, suggesting RA changes   Cont to monitor 15.  Left hand numbness likel carpal tunnel associated with RA vs part of a more diffuse polyneuropathy, given ulnar sparing favor the former wrist splint ordered 16. Labile blood pressure  Cont to monitor for trend  LOS (Days) 5 A FACE TO FACE EVALUATION WAS PERFORMED  Ankit Lorie Phenix 05/18/2017 8:19 AM

## 2017-05-18 NOTE — Progress Notes (Signed)
[facility] Rounding Note  Subjective: Seen in HD Had fall this AM while transferring slid off slide board Says not hurt  Objective: Vital signs in last 24 hours: Temp:  [98.2 F (36.8 C)-98.5 F (36.9 C)] 98.5 F (36.9 C) (03/05 0646) Pulse Rate:  [81-86] 81 (03/05 0646) Resp:  [17-18] 18 (03/05 0646) BP: (94-146)/(56-81) 113/75 (03/05 0900) SpO2:  [97 %] 97 % (03/05 0646) Weight:  [130.2 kg (287 lb 0.6 oz)] 130.2 kg (287 lb 0.6 oz) (03/05 0646) Weight change: 1.6 kg (3 lb 8.4 oz)  Intake/Output from previous day: 03/04 0701 - 03/05 0700 In: 680 [P.O.:680] Out: -  Intake/Output this shift: Total I/O In: 120 [P.O.:120] Out: -  NAD seen in HD Ant clear S1S2 No S3 + 2/6 murmur LSB Chronic skin changes LE's 3+ pitting edema Legs very dyesethetic L AVF + bruit (2/25) R IJ TDC (2/19) dry dressing in use for HD   Medications . Abatacept  125 mg Subcutaneous Q Tue  . amiodarone  200 mg Oral Daily  . apixaban  5 mg Oral BID  . calcitRIOL  0.5 mcg Oral Q T,Th,Sa-HD  . capsaicin   Topical BID  . darbepoetin (ARANESP) injection - DIALYSIS  100 mcg Intravenous Q Tue-HD  . fluticasone  2 spray Each Nare Daily  . gabapentin  300 mg Oral Daily  . insulin aspart  0-15 Units Subcutaneous TID WC  . insulin aspart  6 Units Subcutaneous TID WC  . insulin glargine  25 Units Subcutaneous QHS  . lidocaine  2 patch Transdermal Q24H  . multivitamin  1 tablet Oral QHS  . oxymetazoline  1 spray Each Nare BID  . predniSONE  10 mg Oral Q breakfast   I have reviewed the patient's current medications.  Background: PMH systolic HF EF 62%, DM2, HLD, RA, known CKD. HD initiated this admisson for renal failure,  massive vol overload. Access TDC placed 2/19, L RC AVF 05/10/17. CLIPPED to [place] On rehab d/t severe deconditioning.     Assessment/Plan:  1. ESRD  Still vol xs but bp limiting . HD today. UF goal 3.5 liters (Has MWF spot as outpt but will keep on TTS here until closer to D/C so as not  to interfere with as much rehab) 2. Anemia Darbe 100 QTuesday with HD 3. HPTH calcitriol QOD - changed to HD TTS. PTH 230 on 05/11/17 4. DM controlled 5. Afib on amio/apixaban 6. Obesity 7. DJD 8. RA - on pred 10 9. Neuropathy  Jamal Maes, MD [place] Pager 05/18/2017, 1:37 PM

## 2017-05-18 NOTE — Progress Notes (Signed)
05/18/17 0900  What Happened  Was fall witnessed? Yes  Who witnessed fall? Amy Pt   Patients activity before fall to/from bed, chair, or stretcher  Point of contact buttocks  Was patient injured? No  Follow Up  MD notified [facility]  Family notified No- patient refusal  Additional tests No  Simple treatment Dressing (foam to bottom)  Progress note created (see row info) Yes  Adult Fall Risk Assessment  Risk Factor Category (scoring not indicated) Fall has occurred during this admission (document High fall risk)  Patient's Fall Risk High Fall Risk (>13 points)  Adult Fall Risk Interventions  Required Bundle Interventions *See Row Information* High fall risk - low, moderate, and high requirements implemented  Additional Interventions PT/OT need assessed if change in mobility from baseline  Screening for Fall Injury Risk  Risk For Fall Injury- See Row Information  Bleeding Risk - anticoagulation (not prophylaxis)  Required Injury Bundle Interventions *See Row Information* Injury Bundle Implemented  Screening for Fall Injury Risk Interventions  Specialty Low Bed Contraindicated [place] Smart Bed  Vitals  BP 113/75  BP Location Right Leg  BP Method Automatic  Patient Position (if appropriate) Lying  Pain Assessment  Pain Assessment 0-10  Pain Score 8  Pain Type Acute pain  Pain Location Foot  Pain Orientation Right;Left  Pain Descriptors / Indicators Aching  Pain Onset On-going  Patients Stated Pain Goal 2  Pain Intervention(s) Medication (See eMAR) (lidocaine patches to foot)  Neurological  Neuro (WDL) X  Level of Consciousness Alert  Orientation Level Oriented X4  Cognition Appropriate at baseline  Speech Clear  Pupil Assessment  No  Motor Function/Sensation Assessment Grip;Sensation;Motor strength  R Hand Grip Weak  L Hand Grip Weak   RUE Sensation Decreased;Pain  RUE Motor Strength 4  LUE Sensation Decreased;Pain  LUE Motor Strength 4  RLE Sensation  Pain  RLE Motor Strength 4  LLE Sensation Pain  LLE Motor Strength 4  Neuro Symptoms None  Neuro symptoms relieved by Rest  Musculoskeletal  Musculoskeletal (WDL) X  Assistive Device Wheelchair  Generalized Weakness Yes  Weight Bearing Restrictions No  Musculoskeletal Details  Left Shoulder Limited movement;Weakness  Left Wrist Weakness;Swelling;Limited movement;Surgery  Left Knee Weakness;Swelling  RUE Limited movement;Weakness;Swelling  RLE Limited movement;Weakness;Swelling  LUE Limited movement;Weakness;Swelling  LLE Limited movement;Weakness;Swelling  Left Upper Arm Limited movement;Weakness;Swelling  Integumentary  Integumentary (WDL) X  Skin Color Appropriate for ethnicity  Skin Condition Dry  Skin Integrity Abrasion  Abrasion Location Buttocks  Abrasion Location Orientation Right;Left  Abrasion Intervention Foam;Cleansed  Cracking Location Foot  Cracking Location Orientation Bilateral  Ecchymosis Location Arm  Ecchymosis Location Orientation Bilateral  Ecchymosis Intervention Other (Comment) (assessed)  Moisture Associated Skin Damage Location Groin;Buttocks  Moisture Associated Skin Damage Orientation Bilateral  Pain Assessment  Date Pain First Started 05/17/17  Result of Injury No  Pain Screening  Clinical Progression Not changed  Effect of Pain on Daily Activities limiting  Response to Interventions improving  Pain Assessment  Work-Related Injury No    Pt was transferring to chair with therapy via slideboard and slid off slideboard to floor. Fall was assisted and pt has no new injury as a result. Continue plan of care.

## 2017-05-19 ENCOUNTER — Inpatient Hospital Stay (HOSPITAL_COMMUNITY): Payer: BLUE CROSS/BLUE SHIELD

## 2017-05-19 ENCOUNTER — Inpatient Hospital Stay (HOSPITAL_COMMUNITY): Payer: BLUE CROSS/BLUE SHIELD | Admitting: Physical Therapy

## 2017-05-19 ENCOUNTER — Encounter (HOSPITAL_COMMUNITY): Payer: BLUE CROSS/BLUE SHIELD | Admitting: Psychology

## 2017-05-19 ENCOUNTER — Inpatient Hospital Stay (HOSPITAL_COMMUNITY): Payer: BLUE CROSS/BLUE SHIELD | Admitting: Occupational Therapy

## 2017-05-19 DIAGNOSIS — M792 Neuralgia and neuritis, unspecified: Secondary | ICD-10-CM

## 2017-05-19 DIAGNOSIS — B009 Herpesviral infection, unspecified: Secondary | ICD-10-CM

## 2017-05-19 LAB — GLUCOSE, CAPILLARY
Glucose-Capillary: 193 mg/dL — ABNORMAL HIGH (ref 65–99)
Glucose-Capillary: 123 mg/dL — ABNORMAL HIGH (ref 65–99)
Glucose-Capillary: 124 mg/dL — ABNORMAL HIGH (ref 65–99)
Glucose-Capillary: 223 mg/dL — ABNORMAL HIGH (ref 65–99)

## 2017-05-19 MED ORDER — LIDOCAINE 5 % EX PTCH
3.0000 | MEDICATED_PATCH | CUTANEOUS | Status: DC
  Administered 2017-05-20: 3 via TRANSDERMAL
  Administered 2017-05-21: 2 via TRANSDERMAL
  Administered 2017-05-22 – 2017-05-23 (×2): 3 via TRANSDERMAL
  Administered 2017-05-24: 2 via TRANSDERMAL
  Administered 2017-05-25 – 2017-05-31 (×5): 3 via TRANSDERMAL
  Administered 2017-06-01: 2 via TRANSDERMAL
  Administered 2017-06-02 – 2017-06-03 (×2): 3 via TRANSDERMAL
  Filled 2017-05-19 (×15): qty 3

## 2017-05-19 MED ORDER — ACYCLOVIR 200 MG PO CAPS
200.0000 mg | ORAL_CAPSULE | Freq: Two times a day (BID) | ORAL | Status: AC
  Administered 2017-05-19 – 2017-05-25 (×14): 200 mg via ORAL
  Filled 2017-05-19 (×14): qty 1

## 2017-05-19 MED ORDER — PREDNISONE 20 MG PO TABS
50.0000 mg | ORAL_TABLET | Freq: Once | ORAL | Status: AC
  Administered 2017-05-19: 16:00:00 50 mg via ORAL
  Filled 2017-05-19: qty 2

## 2017-05-19 MED ORDER — DARBEPOETIN ALFA 150 MCG/0.3ML IJ SOSY
150.0000 ug | PREFILLED_SYRINGE | INTRAMUSCULAR | Status: DC
  Administered 2017-05-25: 150 ug via INTRAVENOUS
  Filled 2017-05-19 (×2): qty 0.3

## 2017-05-19 MED ORDER — GERHARDT'S BUTT CREAM
TOPICAL_CREAM | Freq: Two times a day (BID) | CUTANEOUS | Status: DC
  Administered 2017-05-19 – 2017-06-03 (×28): via TOPICAL
  Filled 2017-05-19 (×2): qty 1

## 2017-05-19 MED ORDER — PREDNISONE 20 MG PO TABS
60.0000 mg | ORAL_TABLET | Freq: Every day | ORAL | Status: DC
Start: 2017-05-20 — End: 2017-05-21
  Administered 2017-05-20 – 2017-05-21 (×2): 60 mg via ORAL
  Filled 2017-05-19 (×2): qty 3

## 2017-05-19 NOTE — Progress Notes (Signed)
Physical Therapy Session Note  Patient Details  Name: John Bailey MRN: [id number] Date of Birth: 22-Feb-1971  Today's Date: 05/19/2017 PT Individual Time: 1115-1200 PT Individual Time Calculation (min): 45 min   Short Term Goals: Week 1:  PT Short Term Goal 1 (Week 1): Pt will tolerate 30 min of OOB activity w/o increase in pain level or fatigue PT Short Term Goal 2 (Week 1): Pt will initiate gait training  PT Short Term Goal 3 (Week 1): Pt will transfer bed<>chair via stand pivot w/ RW, Max assist PT Short Term Goal 4 (Week 1): Pt will self-propel w/c 50' w/ min assist PT Short Term Goal 5 (Week 1): Pt will maintain static sitting during functional activity w/ supervision  Skilled Therapeutic Interventions/Progress Updates:    Pt supine in bed, agreeable to participate in therapy session. Pt reports 9/10 pain in BLE this AM due to neuropathy. Supine to sit Min Assist for LE management, HOB elevated and use of bedrails, increased time needed secondary to pain. Pt agreeable to attempt transfer bed to w/c but reports he is unable to tolerate weight through LE at this time. Sitting balance EOB focus on weight shift forward and increasing weight-bearing through BLE, manual cues for WBing. Pt exhibits fair tolerance to weight through LE. Scooting to the L while seated EOB, Max Assist for weight shift and encouragement to use BUE and LE to assist with scooting. Sit to supine Mod Assist for LE management. Rolling L/R Max Assist to straighten bedsheets. Pt left supine in bed with needs in reach, bed alarm in place.  Therapy Documentation Precautions:  Precautions Precautions: Fall Precaution Comments: Severe RA in shoulders, left hand Required Braces or Orthoses: Other Brace/Splint Other Brace/Splint: Bilateral hinged knee braces for comfort and support. Restrictions Weight Bearing Restrictions: No  See Function Navigator for Current Functional Status.   Therapy/Group: Individual  Therapy  Excell Seltzer, PT, DPT  05/19/2017, 12:17 PM

## 2017-05-19 NOTE — Progress Notes (Signed)
Occupational Therapy Session Note  Patient Details  Name: John Bailey MRN: [id number] Date of Birth: 03/26/1970  Today's Date: 05/19/2017 OT Individual Time: 0488-8916 OT Individual Time Calculation (min): 59 min    Short Term Goals: Week 1:  OT Short Term Goal 1 (Week 1): Pt will sit to stand wiht 1 caregiver wiht LRAD to decrease caregiver burden OT Short Term Goal 2 (Week 1): Pt will transfer to w/c wiht MAX A of 1 caregiver in prep for [facility] transfer OT Short Term Goal 3 (Week 1): Pt will complete clohting management prior to toileting with MOD A for standing balance OT Short Term Goal 4 (Week 1): Pt will brush teeth without red foam handle to demonstrate improved gross grasp  Skilled Therapeutic Interventions/Progress Updates:    Pt worked on supine to sit with HOB elevated and use of bed rails for support.  He was able to move both LEs toward the left side of the bed with min assist.  He then completed transition from sidelying to sit.   Once sitting he maintained balance with supervision.  Therapist provided total assist for donning shoes over feet and tying.  Sliding board transfer utilized from bed to wheelchair with total +2 (pt 20%). Emphasis on forward weightshift and trunk flexion during transfer.  Total assist also needed for reciprical scooting back into the wheelchair as well.  Next, therapist completed manual PROM stretch to the right digit flexors in order to assist with finger flexion for holding grooming items.  Pt with limited digit flexion actively to approximately 30% of normal.  He was then able to complete oral hygiene with setup of toothbrush and foam grip over the handle, using the RUE.  Therapist also applied foam grip over the comb as well and provided min assist for combing his hair.  Finished session with pt in wheelchair with soft touch call button in place and phone in reach.  Encouraged pt to work on [employer] of the right hand when watching TV.    Therapy  Documentation Precautions:  Precautions Precautions: Fall Precaution Comments: Severe RA in shoulders, left hand Required Braces or Orthoses: Other Brace/Splint Other Brace/Splint: Bilateral hinged knee braces for comfort and support. Restrictions Weight Bearing Restrictions: No  Pain: Pain Assessment Pain Assessment: Faces Faces Pain Scale: Hurts even more Pain Type: Acute pain Pain Location: Generalized Pain Orientation: Right;Left Pain Descriptors / Indicators: Discomfort Pain Onset: On-going Pain Intervention(s): Medication (See eMAR) Multiple Pain Sites: No ADL: See Function Navigator for Current Functional Status.   Therapy/Group: Individual Therapy  Wade Asebedo OTR/L 05/19/2017, 4:15 PM

## 2017-05-19 NOTE — Progress Notes (Signed)
[facility] Rounding Note  Subjective: Had HD yesterday Got 4 liters off Still has edema Joint pains - primary has added some voltaren gel and capsacin to current lido patches  Objective: Vital signs in last 24 hours: Temp:  [98 F (36.7 C)-100.2 F (37.9 C)] 100.2 F (37.9 C) (03/06 0714) Pulse Rate:  [80-98] 93 (03/06 0714) Resp:  [18] 18 (03/06 0714) BP: (96-128)/(46-71) 99/50 (03/06 0714) SpO2:  [94 %] 94 % (03/06 0714) Weight:  [124 kg (273 lb 5.9 oz)-130.9 kg (288 lb 9.3 oz)] 128.5 kg (283 lb 4.7 oz) (03/06 0713) Weight change: 0.7 kg (1 lb 8.7 oz)  NAD  Ant clear S1S2 No S3 + 2/6 murmur LSB Chronic skin changes LE's 2-3+ pitting edema Legs dyesethetic L AVF + bruit (2/25) R IJ TDC (2/19) dry dressing in place   No labs done today  Recent Labs    05/18/17 1617  NA 130*  K 3.0*  CL 95*  CO2 20*  GLUCOSE 190*  BUN 75*  CREATININE 7.92*  CALCIUM 8.9  PHOS 5.0*    Recent Labs    05/18/17 1617  ALBUMIN 2.2*   CBC: Recent Labs    05/18/17 1617  WBC 30.0*  HGB 8.8*  HCT 25.1*  MCV 79.2  PLT 350   Medications . Abatacept  125 mg Subcutaneous Q Tue  . acyclovir  200 mg Oral BID  . amiodarone  200 mg Oral Daily  . apixaban  5 mg Oral BID  . calcitRIOL  0.5 mcg Oral Q T,Th,Sa-HD  . capsaicin   Topical BID  . darbepoetin (ARANESP) injection - DIALYSIS  100 mcg Intravenous Q Tue-HD  . fluticasone  2 spray Each Nare Daily  . gabapentin  300 mg Oral Daily  . Gerhardt's butt cream   Topical BID  . insulin aspart  0-15 Units Subcutaneous TID WC  . insulin aspart  6 Units Subcutaneous TID WC  . insulin glargine  25 Units Subcutaneous QHS  . lidocaine  2 patch Transdermal Q24H  . multivitamin  1 tablet Oral QHS  . predniSONE  10 mg Oral Q breakfast    Background: PMH systolic HF EF 91%, DM2, HLD, RA, known CKD. HD initiated this admisson for renal failure,  massive vol overload. Access TDC placed 2/19, L RC AVF 05/10/17. CLIPPED to [place] On rehab d/t  severe deconditioning.     Assessment/Plan:  1. ESRD  Still vol xs. 4L off last TMT. HD tomorrow.  (Has MWF spot as outpt but will keep on TTS for nw). EDW  - working toward.  2. Anemia Darbe 100 QTuesday with HD. ^ dose to 150 3. HPTH calcitriol QOD - changed to HD TTS. PTH 230 on 05/11/17 4. DM controlled 5. Afib on amio/apixaban 6. Obesity 7. DJD 8. RA - on pred 10/gaba/topicals 9. Neuropathy 10. Leukocytosis WBC 30K yesterday. Source of this??  Jamal Maes, MD [facility] [phone] Pager 05/19/2017, 12:29 PM

## 2017-05-19 NOTE — Progress Notes (Signed)
Physical Therapy Session Note  Patient Details  Name: John Bailey MRN: [id number] Date of Birth: 11-15-1970  Today's Date: 05/19/2017 PT Individual Time: 0801-0900 PT Individual Time Calculation (min): 59 min   Short Term Goals: Week 1:  PT Short Term Goal 1 (Week 1): Pt will tolerate 30 min of OOB activity w/o increase in pain level or fatigue PT Short Term Goal 2 (Week 1): Pt will initiate gait training  PT Short Term Goal 3 (Week 1): Pt will transfer bed<>chair via stand pivot w/ RW, Max assist PT Short Term Goal 4 (Week 1): Pt will self-propel w/c 50' w/ min assist PT Short Term Goal 5 (Week 1): Pt will maintain static sitting during functional activity w/ supervision  Skilled Therapeutic Interventions/Progress Updates:    Pt supine in bed upon PT arrival, agreeable to therapy tx and reports pain 8/10 in B ankles. Therapist performed passive ankle ROM and DF stretches to B ankles. Pt performed LE ROM/exercises, 2 x 10 each: assisted heel slides, assisted SLR, assisted hip abd/adduction, SAQ. Pt transferred from supine>sitting EOB with mod assist to help lift trunk, pt requiring min assist initially once sitting to maintain trunk control and seated balance. Pt able to maintain seated balance with stand by assist, verbal and tactile cues for anterior trunk lean, manual assist for better positioning of LEs. Feet positioned under pt in sitting to promote increased DF ROM, pt reports stretch. While sitting EOB therapist performed gentle UE ROM bilaterally for wrist flexion/extension, supination, and finger flexion. Pt performed x 10 shoulder rows with manual resistance. Pt attempted to perform sit<>stand from elevated bed height using platform walker, unable to clear the bed despite max assist. Pt reports feeling defeated that he is unable to standing and is unwilling to make more attempts. Pt transferred to supine with mod assist, left supine in bed with needs in reach.   Therapy  Documentation Precautions:  Precautions Precautions: Fall Precaution Comments: Severe RA in shoulders, left hand Required Braces or Orthoses: Other Brace/Splint Other Brace/Splint: Bilateral hinged knee braces for comfort and support. Restrictions Weight Bearing Restrictions: No   See Function Navigator for Current Functional Status.   Therapy/Group: Individual Therapy  Netta Corrigan, PT, DPT 05/19/2017, 7:57 AM

## 2017-05-19 NOTE — Progress Notes (Signed)
Occupational Therapy Session Note  Patient Details  Name: John Bailey MRN: [id number] Date of Birth: [birth year]  Today's Date: 05/19/2017 OT Individual Time: 1002-1107 OT Individual Time Calculation (min): 65 min    Short Term Goals: Week 1:  OT Short Term Goal 1 (Week 1): Pt will sit to stand wiht 1 caregiver wiht LRAD to decrease caregiver burden OT Short Term Goal 2 (Week 1): Pt will transfer to w/c wiht MAX A of 1 caregiver in prep for [facility] transfer OT Short Term Goal 3 (Week 1): Pt will complete clohting management prior to toileting with MOD A for standing balance OT Short Term Goal 4 (Week 1): Pt will brush teeth without red foam handle to demonstrate improved gross grasp  Skilled Therapeutic Interventions/Progress Updates:    Pt completed bathing and dressing supine to sit EOB.  Max assist for rolling side to side in order for therapist to provide total assist for washing his buttocks.  He was able to wash his front peri area using the RUE with setup of washcloth with soap already applied.  Total assist for donning underpants and pants in supine with rolling side to side to pull them over his hips.  Increased pain in the left foot as well as the right foot and ankle with all movements.  Total assist for supine to sit EOB with HOB flat.  Pt unable to use UEs to assist with bringing trunk up to sitting.  Once up, he was able to maintain his sitting balance with supervision.  He worked on [employer] and dressing.  Max assist for doffing and donning pullover shirt.  Mod assist for UB bathing with use of a LH sponge in the LUE to wash the right shoulder and arm.  Max assist for donning deodorant under the RUE as he could not hold it in the left hand.  Finished session with transition back to supine with max assist and scooting up in the bed.  Pt left with call button in lap and HOB elevated for comfort.     Therapy Documentation Precautions:  Precautions Precautions: Fall Precaution  Comments: Severe RA in shoulders, left hand Required Braces or Orthoses: Other Brace/Splint Other Brace/Splint: Bilateral hinged knee braces for comfort and support. Restrictions Weight Bearing Restrictions: No  Pain: Pain Assessment Pain Assessment: No/denies pain ADL: See Function Navigator for Current Functional Status.   Therapy/Group: Individual Therapy  Martavious Hartel OTR/L 05/19/2017, 12:34 PM

## 2017-05-19 NOTE — Patient Care Conference (Signed)
Inpatient RehabilitationTeam Conference and Plan of Care Update Date: 05/19/2017   Time: 2:15 PM    Patient Name: John Bailey      Medical Record Number: [id number]  Date of Birth: [birth year] Sex: Male         Room/Bed: 4M08C/4M08C-01 Payor Info: Payor: BLUE CROSS BLUE SHIELD / Plan: BCBS OTHER / Product Type: *No Product type* /    Admitting Diagnosis: Debility  Admit Date/Time:  05/13/2017  6:09 PM Admission Comments: No comment available   Primary Diagnosis:  <principal problem not specified> Principal Problem: <principal problem not specified>  Patient Active Problem List   Diagnosis Date Noted  . HSV (herpes simplex virus) infection   . Neuropathic pain   . Labile blood pressure   . Labile blood glucose   . Carpal tunnel syndrome of left wrist   . Pain   . Leukocytosis   . PAF (paroxysmal atrial fibrillation) ([place])   . Acute on chronic diastolic (congestive) heart failure ([place])   . Type 2 diabetes mellitus with peripheral neuropathy (HCC)   . Acute blood loss anemia   . Anemia of chronic disease   . ESRD on dialysis ([place])   . Cardiorenal syndrome with renal failure   . Cardiorenal syndrome, stage 1-4 or unspecified chronic kidney disease, with heart failure ([place]) 05/06/2017  . Atrial fibrillation, chronic ([place]) 04/22/2017  . Hypoxia 04/22/2017  . Excessive daytime sleepiness 04/02/2017  . Debility 04/02/2017  . Bradycardia 03/28/2017  . CKD (chronic kidney disease) stage 4, GFR 15-29 ml/min (HCC) 03/28/2017  . Pseudogout 03/09/2017  . Rheumatoid arthritis ([place]) 03/09/2017  . Infected ulcer of skin, with fat layer exposed ([place]) 03/09/2017  . Normocytic anemia 03/06/2017  . Elevated troponin 03/06/2017  . Acute renal failure superimposed on stage 4 chronic kidney disease ([place]) 03/06/2017  . Essential hypertension 12/29/2016  . Persistent atrial fibrillation ([place])   . Left leg cellulitis   . Atrial fibrillation with RVR ([place]) 12/02/2016  . Sepsis ([place]) 12/02/2016   . Acute on chronic congestive heart failure ([place])   . Acute on chronic renal insufficiency   . Chronic diastolic CHF (congestive heart failure) ([place]) 03/20/2016  . Nonischemic cardiomyopathy ([place]) 11/11/2015  . Long term (current) use of anticoagulants [Z79.01] 08/14/2015  . Insulin-dependent diabetes mellitus with renal complications ([place]) 67/20/9470  . Atrial flutter with rapid ventricular response ([place]) 08/06/2015  . Hyponatremia 07/30/2015  . Acute kidney injury ([place]) 07/30/2015  . Hyperkalemia 07/30/2015  . Acute on chronic diastolic CHF (congestive heart failure) ([place]) 07/30/2015  . Morbid obesity ([place]) 07/30/2015  . Paroxysmal atrial fibrillation ([place]) 07/30/2015  . 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 Hypertensive heart disease with heart failure ([place]) 07/14/2012  . Dyspnea 07/14/2012  . Hyperlipidemia 07/14/2012    Expected Discharge Date: Expected Discharge Date: 06/03/17  Team Members Present: Physician leading conference: Dr. Delice Lesch Social Worker Present: Ovidio Kin, LCSW Nurse Present: Dorien Chihuahua, RN PT Present: Michaelene Song, PT OT Present: Clyda Greener, OT PPS Coordinator present : Daiva Nakayama, RN, CRRN     Current Status/Progress Goal Weekly Team Focus  Medical   Debility secondary to cardiorenal syndrome, synovitis left hand due to RA  Improve mobility, safety, RA, HSV, diabetic neuropathy, BP/DM  See above   Bowel/Bladder   cont of B/B; LBM 3/5  maintain continence  assess q shift and prn   Swallow/Nutrition/ Hydration             ADL's  Mod assist for UB bathing and dressing with AE, min assist for grooming tasks, total assist for LB selfcare, very limted by pain in knees and UEs  supervision to min assist  selfcare retraining, balance retraining, transfer training, therapeutic exercise, pt/family education   Mobility   Max-Total assist bed mobility and transfers via stedy, can be up to +2 to stand  from elevated surface  Min assist overall   OOB tolerance, transfers, bed mobility, LE strengthening, pain tolerance and pain relief strategies   Communication             Safety/Cognition/ Behavioral Observations  A&Ox4; no impulsivity  maintain  call bell within reach; assess q shift and prn   Pain   c/o pain to bilat lower and upper extremities;  <4 out of 10  assess q shift and prn; tylenol; voltaren; gabapentin; lidocaine; robaxin; percocet; capsaicin cream   Skin   L wrist fistula; R chest HD cath; edema to BLE and BUE; abrasion to buttocks  free from infection and further breakdown  assess q shift and PRN; foam to buttocks      *See Care Plan and progress notes for long and short-term goals.     Barriers to Discharge  Current Status/Progress Possible Resolutions Date Resolved   Physician    Medical stability;Other (comments);Hemodialysis;Decreased caregiver support;Lack of/limited family support  Pain from RA  See above  Therapies, optimize BP/DM meds, optimize pain meds, anitvirals, RA meds resumed      Nursing  Weight;Wound Care               PT  Inaccessible home environment;Home environment access/layout;Decreased caregiver support;Lack of/limited family support  split level home, 3 steps to go to basement and 8 steps to go to first floor, wife works during the day  Wife and/or other caregivers have not been present           OT                  SLP                SW                Discharge Planning/Teaching Needs:  HOme wife works during the day he is arranging for friends to coem by while she is working. Pt needs to push himself and bear weight on his legs      Team Discussion:  Pain is his limiting factor-MD has prescribed steroids and RA meds started yesterday-hopefully will assist and he will be able to participate more in therapies. Currently plus 2 with stedy. Goals of min assist may need to be downgraded if can not control pain enough to participate in  therapies. MD adjusting diabetes meds  Revisions to Treatment Plan:  DC 3/21    Continued Need for Acute Rehabilitation Level of Care: The patient requires daily medical management by a physician with specialized training in physical medicine and rehabilitation for the following conditions: Daily direction of a multidisciplinary physical rehabilitation program to ensure safe treatment while eliciting the highest outcome that is of practical value to the patient.: Yes Daily medical management of patient stability for increased activity during participation in an intensive rehabilitation regime.: Yes Daily analysis of laboratory values and/or radiology reports with any subsequent need for medication adjustment of medical intervention for : Diabetes problems;Wound care problems;Blood pressure problems;Other  Elease Hashimoto 05/19/2017, 3:57 PM

## 2017-05-19 NOTE — Progress Notes (Signed)
Social Work Patient ID: John Bailey, male   DOB: 1971-01-27, 47 y.o.   MRN: [id number]  Met with pt along with his sister in-law to discuss team conference goals min assist and if can not control pain will need to downgrade goals to mod level. Target discharge 3/21. Made aware he will require 24 hr care at discharge. He states: " I am never alone I always have someone with me."  He also states he pushes himself, talks better than acting according to therapists. He does have someone to take him to HD and will be in and out of his home while his wife works. Discussed getting the ones who will be helping to come in and attend therapies with him to see his level of assist prior to discharge home. Will continue to work on discharge needs. He reports a new lift chair was bought for him already.

## 2017-05-19 NOTE — Progress Notes (Signed)
Pharmacy Antibiotic Note  John Bailey is a 47 y.o. 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 Pharmacy has been consulted for acyclovir dosing. Pt has recently progressed to ESRD and started HD, tolerating well.  Plan: Acyclovir 200 mg PO BID x 7 days.  Pharmacy sign off.  Height: 6' (182.9 cm) Weight: 283 lb 4.7 oz (128.5 kg) IBW/kg (Calculated) : 77.6  Temp (24hrs), Avg:99.1 F (37.3 C), Min:98 F (36.7 C), Max:100.2 F (37.9 C)  Recent Labs  Lab 05/13/17 0352 05/13/17 1230 05/15/17 0921 05/18/17 1617  WBC 28.6*  --  29.6* 30.0*  CREATININE  --  6.52* 7.38* 7.92*    Estimated Creatinine Clearance: 16.5 mL/min (A) (by C-G formula based on SCr of 7.92 mg/dL (H)).    No Known Allergies  Thank you for allowing pharmacy to be a part of this patient's care.  Maryanna Shape, PharmD, BCPS  Clinical Pharmacist  Pager: [phone]   05/19/2017 3:01 PM

## 2017-05-19 NOTE — Progress Notes (Signed)
[facility] PHYSICAL MEDICINE & REHABILITATION     PROGRESS NOTE  Subjective/Complaints:  Patient seen sitting up in bed this morning. He states he slept well overnight. He states that he feels more tired this morning. He also complains about oral ulcers. He does note improvement in foot pain. Had discussion with patient's PCP yesterday regarding baseline level of functioning.  ROS: + Oral ulcers. Denies CP, SOB, nausea, vomiting, diarrhea.  Objective: Vital Signs: Blood pressure (!) 99/50, pulse 93, temperature 100.2 F (37.9 C), temperature source Oral, resp. rate 18, height 6' (1.829 m), weight 128.5 kg (283 lb 4.7 oz), SpO2 94 %. No results found. Recent Labs    05/18/17 1617  WBC 30.0*  HGB 8.8*  HCT 25.1*  PLT 350   Recent Labs    05/18/17 1617  NA 130*  K 3.0*  CL 95*  GLUCOSE 190*  BUN 75*  CREATININE 7.92*  CALCIUM 8.9   CBG (last 3)  Recent Labs    05/18/17 1151 05/18/17 1942 05/19/17 0711  GLUCAP 108* 181* 193*    Wt Readings from Last 3 Encounters:  05/19/17 128.5 kg (283 lb 4.7 oz)  05/13/17 132.1 kg (291 lb 3.6 oz)  04/02/17 (!) 154.9 kg (341 lb 9.6 oz)    Physical Exam:  BP (!) 99/50 (BP Location: Right Arm)   Pulse 93   Temp 100.2 F (37.9 C) (Oral)   Resp 18   Ht 6' (1.829 m)   Wt 128.5 kg (283 lb 4.7 oz)   SpO2 94%   BMI 38.42 kg/m  Constitutional: Obese male. NAD.  HENT: Normocephalic. Atraumatic. Oral: Blisters with ulceration noted on lips and tongue Eyes:EOMare normal. No discharge.  Cardiovascular:RRR. No JVD. Respiratory:Effort normal and breath sounds normal.  GI: Bowel sounds are normal. He exhibitsno distension.  Musc:  Bouchard's deformities  TTP left hand with edema Pain with bilateral knee, edema +Edema b/l LE Neurological: Alert and oriented.  Motor: Right upper extremity 3/5 proximal to distal (pain inhibition) Left upper extremity: Shoulder abduction, elbow flexion/extension 3 -/5, hand grip 2-/5 (pain  inhibition) Bilateral lower extremities: 2+/5 proximal to distal (some pain inhibition, improving)  Sensation is intact to light touch bilateral upper and lower limbs Skin. LE ischemic changes.   Assessment/Plan: 1. Functional deficits secondary to debility which require 3+ hours per day of interdisciplinary therapy in a comprehensive inpatient rehab setting. Physiatrist is providing close team supervision and 24 hour management of active medical problems listed below. Physiatrist and rehab team continue to assess barriers to discharge/monitor patient progress toward functional and medical goals.  Function:  Bathing Bathing position   Position: Sitting EOB  Bathing parts Body parts bathed by patient: Left arm, Chest, Abdomen Body parts bathed by helper: Right arm, Back  Bathing assist Assist Level: 2 helpers(MOD A; 2 helpers sit to stand for buttock hygiene)      Upper Body Dressing/Undressing Upper body dressing   What is the patient wearing?: Pull over shirt/dress     Pull over shirt/dress - Perfomed by patient: Thread/unthread right sleeve, Thread/unthread left sleeve Pull over shirt/dress - Perfomed by helper: Put head through opening, Pull shirt over trunk        Upper body assist Assist Level: Supervision or verbal cues(per OT note)      Lower Body Dressing/Undressing Lower body dressing Lower body dressing/undressing activity did not occur: N/A What is the patient wearing?: Underwear, Pants, Shoes   Underwear - Performed by helper: Thread/unthread right underwear leg, Thread/unthread  left underwear leg, Pull underwear up/down   Pants- Performed by helper: Thread/unthread right pants leg, Thread/unthread left pants leg, Pull pants up/down           Shoes - Performed by helper: Don/doff right shoe, Don/doff left shoe(slippers)          Lower body assist Assist for lower body dressing: 2 Helpers      Toileting Toileting     Toileting steps completed by  helper: Adjust clothing prior to toileting, Performs perineal hygiene, Adjust clothing after toileting(per Candice Applewhite, NT)    Toileting assist     Transfers Chair/bed transfer Chair/bed transfer activity did not occur: Refused Chair/bed transfer method: Lateral scoot Chair/bed transfer assist level: 2 helpers Chair/bed transfer assistive device: Armrests, Sliding board Mechanical lift: Ecologist Ambulation activity did not occur: Scientist, research (physical sciences) activity did not occur: Refused Type: Manual Max wheelchair distance: 10 ft Assist Level: Supervision or verbal cues  Cognition Comprehension Comprehension assist level: Follows complex conversation/direction with no assist  Expression Expression assist level: Expresses complex ideas: With no assist  Social Interaction Social Interaction assist level: Interacts appropriately with others - No medications needed.  Problem Solving Problem solving assist level: Solves complex problems: Recognizes & self-corrects  Memory Memory assist level: Complete Independence: No helper    Medical Problem List and Plan: 1.Debilitysecondary to cardiorenal syndrome, synovitis left hand due to RA   Cont CIR    2. DVT Prophylaxis/Anticoagulation: Eliquis. Monitor for any bleeding episodes 3. Pain Management:Neurontin 300 mg daily, Robaxin as needed   Voltaren gel added for joint pain.   Neuropathic pain Left hand - cont gabapentin, started on capsasin cream .025%   Lidoderm patch added on 3/5   Improving 4. Mood:Provide emotional support 5. Neuropsych: This patientiscapable of making decisions on hisown behalf. 6. Skin/Wound Care:Routine skin checks 7. Fluids/Electrolytes/Nutrition:Routine I&O's 8.End-stage renal disease. Hemodialysis initiated Tuesday Thursday Saturday schedule. Patient with left AVFgraft placed 05/10/2017   Appreciate nephro recs. 9.Acute on chronic anemia. Continue  Aranesp.    Hb 8.8 on 3/5   Cont to monitor 10.Diabetes mellitus with peripheral neuropathy. Latest hemoglobin A1c 9.2. Diabetic teaching   Lantus insulin 25 units daily at bedtime   NovoLog 3 units 3 times a day, increased to 6 on 3/4.    Monitor with increased mobility   Labile on 3/6 11.Acute on chronic diastolic congestive heart failure. Monitor for any signs of fluid overload. Continue Coreg 6.25mg  BID  Filed Weights   05/18/17 1300 05/18/17 1744 05/19/17 0713  Weight: 130.9 kg (288 lb 9.3 oz) 124 kg (273 lb 5.9 oz) 128.5 kg (283 lb 4.7 oz)   12.Rheumatoid arthritis. Prednisone 10 mg daily. Patient had been receiving Abataceptevery Tuesday prior to admission. Added Voltaren gel. Follow-up rheumatologist Dr.Ziokowska   MRI right knee reviewed, showing OA, meniscal tear and patellar tendinosis on right.  Per Ortho, braces PRN   DMARD resumed on 3/5  13.Atrial fibrillation RVR. Status post cardioversion. Amiodarone decreased to 200 mg daily 3/3 14.Leukocytosis. Felt to be related to prednisone.    Low-grade fever    WBCs 30.0 on 3/5   Hand films reviewed, suggesting RA changes   Cont to monitor 15.  Left hand numbness likel carpal tunnel associated with RA vs part of a more diffuse polyneuropathy, given ulnar sparing favor the former wrist splint ordered 16. Labile blood pressure  Cont to monitor for trend 17. Oral HSV 1  in immunocompromised   Acyclovir initiated on 3/6  LOS (Days) 6 A FACE TO FACE EVALUATION WAS PERFORMED  Chenell Lozon Lorie Phenix 05/19/2017 8:34 AM

## 2017-05-20 ENCOUNTER — Inpatient Hospital Stay (HOSPITAL_COMMUNITY): Payer: BLUE CROSS/BLUE SHIELD | Admitting: Physical Therapy

## 2017-05-20 ENCOUNTER — Inpatient Hospital Stay (HOSPITAL_COMMUNITY): Payer: BLUE CROSS/BLUE SHIELD | Admitting: Occupational Therapy

## 2017-05-20 DIAGNOSIS — K5900 Constipation, unspecified: Secondary | ICD-10-CM

## 2017-05-20 DIAGNOSIS — T380X5A Adverse effect of glucocorticoids and synthetic analogues, initial encounter: Secondary | ICD-10-CM

## 2017-05-20 DIAGNOSIS — R739 Hyperglycemia, unspecified: Secondary | ICD-10-CM

## 2017-05-20 LAB — RENAL FUNCTION PANEL
ALBUMIN: 2.3 g/dL — AB (ref 3.5–5.0)
Anion gap: 18 — ABNORMAL HIGH (ref 5–15)
BUN: 71 mg/dL — AB (ref 6–20)
CALCIUM: 9.1 mg/dL (ref 8.9–10.3)
CHLORIDE: 91 mmol/L — AB (ref 101–111)
CO2: 21 mmol/L — ABNORMAL LOW (ref 22–32)
CREATININE: 6.96 mg/dL — AB (ref 0.61–1.24)
GFR, EST AFRICAN AMERICAN: 10 mL/min — AB (ref >60)
GFR, EST NON AFRICAN AMERICAN: 9 mL/min — AB (ref >60)
Glucose, Bld: 297 mg/dL — ABNORMAL HIGH (ref 65–99)
PHOSPHORUS: 5.5 mg/dL — AB (ref 2.5–4.6)
Potassium: 3.4 mmol/L — ABNORMAL LOW (ref 3.5–5.1)
SODIUM: 130 mmol/L — AB (ref 135–145)

## 2017-05-20 LAB — CBC
HCT: 25 % — ABNORMAL LOW (ref 39.0–52.0)
Hemoglobin: 8.4 g/dL — ABNORMAL LOW (ref 13.0–17.0)
MCH: 26.7 pg (ref 26.0–34.0)
MCHC: 33.6 g/dL (ref 30.0–36.0)
MCV: 79.4 fL (ref 78.0–100.0)
PLATELETS: 404 10*3/uL — AB (ref 150–400)
RBC: 3.15 MIL/uL — AB (ref 4.22–5.81)
RDW: 19.8 % — AB (ref 11.5–15.5)
WBC: 27.6 10*3/uL — AB (ref 4.0–10.5)

## 2017-05-20 LAB — GLUCOSE, CAPILLARY
GLUCOSE-CAPILLARY: 270 mg/dL — AB (ref 65–99)
GLUCOSE-CAPILLARY: 172 mg/dL — AB (ref 65–99)
Glucose-Capillary: 299 mg/dL — ABNORMAL HIGH (ref 65–99)
Glucose-Capillary: 177 mg/dL — ABNORMAL HIGH (ref 65–99)

## 2017-05-20 MED ORDER — SENNOSIDES-DOCUSATE SODIUM 8.6-50 MG PO TABS
2.0000 | ORAL_TABLET | Freq: Every day | ORAL | Status: DC
  Administered 2017-05-20 – 2017-06-03 (×6): 2 via ORAL
  Filled 2017-05-20 (×13): qty 2

## 2017-05-20 NOTE — Progress Notes (Signed)
[facility] PHYSICAL MEDICINE & REHABILITATION     PROGRESS NOTE  Subjective/Complaints:  Patient seen lying in bed this morning. Front at bedside. He states he slept well overnight. He states he feels little better initiation of steroids yesterday. He notes his blisters improving as well.  ROS: Denies CP, SOB, nausea, vomiting, diarrhea.  Objective: Vital Signs: Blood pressure 136/79, pulse 80, temperature 97.8 F (36.6 C), temperature source Oral, resp. rate 18, height 6' (1.829 m), weight 126.4 kg (278 lb 10.6 oz), SpO2 95 %. No results found. Recent Labs    05/18/17 1617  WBC 30.0*  HGB 8.8*  HCT 25.1*  PLT 350   Recent Labs    05/18/17 1617  NA 130*  K 3.0*  CL 95*  GLUCOSE 190*  BUN 75*  CREATININE 7.92*  CALCIUM 8.9   CBG (last 3)  Recent Labs    05/19/17 1630 05/19/17 2133 05/20/17 0659  GLUCAP 124* 223* 299*    Wt Readings from Last 3 Encounters:  05/20/17 126.4 kg (278 lb 10.6 oz)  05/13/17 132.1 kg (291 lb 3.6 oz)  04/02/17 (!) 154.9 kg (341 lb 9.6 oz)    Physical Exam:  BP 136/79 (BP Location: Right Arm)   Pulse 80   Temp 97.8 F (36.6 C) (Oral)   Resp 18   Ht 6' (1.829 m)   Wt 126.4 kg (278 lb 10.6 oz)   SpO2 95%   BMI 37.79 kg/m  Constitutional: Obese male. NAD.  HENT: Normocephalic. Atraumatic. Oral: Blisters with ulceration noted on lips and tongue Eyes:EOMare normal. No discharge.  Cardiovascular:RRR. No JVD. Respiratory:Effort normal and breath sounds normal.  GI: Bowel sounds are normal. + Distention.  Musc:  Bouchard's deformities  TTP left hand with edema +Edema b/l LE Neurological: Alert and oriented.  Motor: Right upper extremity 3+/5 proximal to distal (pain inhibition) Left upper extremity: Shoulder abduction, elbow flexion/extension 3/5, hand grip 3-/5 (pain inhibition) Bilateral lower extremities: 2+/5 proximal to distal (some pain inhibition, improving)  Sensation is intact to light touch bilateral upper and lower  limbs Skin. LE ischemic changes.   Assessment/Plan: 1. Functional deficits secondary to debility which require 3+ hours per day of interdisciplinary therapy in a comprehensive inpatient rehab setting. Physiatrist is providing close team supervision and 24 hour management of active medical problems listed below. Physiatrist and rehab team continue to assess barriers to discharge/monitor patient progress toward functional and medical goals.  Function:  Bathing Bathing position   Position: Bed  Bathing parts Body parts bathed by patient: Left arm, Chest, Abdomen, Front perineal area Body parts bathed by helper: Buttocks, Right upper leg, Left upper leg, Right arm, Right lower leg, Left lower leg, Back  Bathing assist Assist Level: 2 helpers(MOD A; 2 helpers sit to stand for buttock hygiene)      Upper Body Dressing/Undressing Upper body dressing   What is the patient wearing?: Pull over shirt/dress     Pull over shirt/dress - Perfomed by patient: Thread/unthread right sleeve, Thread/unthread left sleeve Pull over shirt/dress - Perfomed by helper: Put head through opening, Pull shirt over trunk        Upper body assist Assist Level: Supervision or verbal cues(per OT note)      Lower Body Dressing/Undressing Lower body dressing Lower body dressing/undressing activity did not occur: N/A What is the patient wearing?: Pants, Underwear   Underwear - Performed by helper: Thread/unthread right underwear leg, Thread/unthread left underwear leg, Pull underwear up/down   Pants- Performed by helper:  Thread/unthread right pants leg, Thread/unthread left pants leg           Shoes - Performed by helper: Don/doff right shoe, Don/doff left shoe(slippers)          Lower body assist Assist for lower body dressing: 2 Helpers      Toileting Toileting     Toileting steps completed by helper: Adjust clothing prior to toileting, Performs perineal hygiene, Adjust clothing after toileting     Toileting assist     Transfers Chair/bed transfer Chair/bed transfer activity did not occur: Refused Chair/bed transfer method: Lateral scoot Chair/bed transfer assist level: 2 helpers Chair/bed transfer assistive device: Sliding board, Armrests Mechanical lift: Ecologist Ambulation activity did not occur: Scientist, research (physical sciences) activity did not occur: Refused Type: Manual Max wheelchair distance: 10 ft Assist Level: Supervision or verbal cues  Cognition Comprehension Comprehension assist level: Follows complex conversation/direction with no assist  Expression Expression assist level: Expresses complex ideas: With no assist  Social Interaction Social Interaction assist level: Interacts appropriately with others - No medications needed.  Problem Solving Problem solving assist level: Solves complex problems: Recognizes & self-corrects  Memory Memory assist level: Complete Independence: No helper    Medical Problem List and Plan: 1.Debilitysecondary to cardiorenal syndrome, synovitis left hand due to RA   Cont CIR    2. DVT Prophylaxis/Anticoagulation: Eliquis. Monitor for any bleeding episodes 3. Pain Management:Neurontin 300 mg daily, Robaxin as needed   Voltaren gel added for joint pain.   Neuropathic pain Left hand - cont gabapentin, started on capsasin cream .025%   Lidoderm patch added on 3/5   Improving 4. Mood:Provide emotional support 5. Neuropsych: This patientiscapable of making decisions on hisown behalf. 6. Skin/Wound Care:Routine skin checks 7. Fluids/Electrolytes/Nutrition:Routine I&O's 8.End-stage renal disease. Hemodialysis initiated Tuesday Thursday Saturday schedule. Patient with left AVFgraft placed 05/10/2017   Appreciate nephro recs. 9.Acute on chronic anemia. Continue Aranesp.    Hb 8.8 on 3/5   Cont to monitor 10.Steroid induced hyperglycemia on Diabetes mellitus with peripheral neuropathy. Latest  hemoglobin A1c 9.2. Diabetic teaching   Lantus insulin 25 units daily at bedtime   NovoLog 3 units 3 times a day, increased to 6 on 3/4.    Monitor with increased mobility   Elevated on 3/7, due to increase in steroids, will change sensitivity scale 11.Acute on chronic diastolic congestive heart failure. Monitor for any signs of fluid overload. Continue Coreg 6.25mg  BID  Filed Weights   05/18/17 1744 05/19/17 0713 05/20/17 0500  Weight: 124 kg (273 lb 5.9 oz) 128.5 kg (283 lb 4.7 oz) 126.4 kg (278 lb 10.6 oz)   12.Rheumatoid arthritis. Prednisone 10 mg daily. Patient had been receiving Abataceptevery Tuesday prior to admission. Added Voltaren gel. Follow-up rheumatologist Dr.Ziokowska   MRI right knee reviewed, showing OA, meniscal tear and patellar tendinosis on right.  Per Ortho, braces PRN   DMARD resumed on 3/5    Started on high-dose 6 week steroid taper on 3/6 after discussion with therapies during team conference on 3/6 13.Atrial fibrillation RVR. Status post cardioversion. Amiodarone decreased to 200 mg daily 3/3 14.Leukocytosis. Felt to be related to prednisone.    Low-grade fever    WBCs 30.0 on 3/5   Hand films reviewed, suggesting RA changes   Cont to monitor 15.  Left hand numbness   Likely related to fistula placement  16. Labile blood pressure  Relatively controlled on 3/7 17. 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 Constipation   Bowel regimen increased on 3/7  LOS (Days) 7 A FACE TO FACE EVALUATION WAS PERFORMED  John Bailey 05/20/2017 8:49 AM

## 2017-05-20 NOTE — Procedures (Signed)
UFG 4 liters 2K bath pending labs [facility] is access AVF + bruit  Jamal Maes, MD [place] Pager 05/20/2017, 12:47 PM

## 2017-05-20 NOTE — Progress Notes (Signed)
Occupational Therapy Session Note  Patient Details  Name: John Bailey MRN: [id number] Date of Birth: [birth year]  Today's Date: 05/20/2017 OT Individual Time: 1000-1055 OT Individual Time Calculation (min): 55 min    Short Term Goals: Week 1:  OT Short Term Goal 1 (Week 1): Pt will sit to stand wiht 1 caregiver wiht LRAD to decrease caregiver burden OT Short Term Goal 2 (Week 1): Pt will transfer to w/c wiht MAX A of 1 caregiver in prep for [facility] transfer OT Short Term Goal 3 (Week 1): Pt will complete clohting management prior to toileting with MOD A for standing balance OT Short Term Goal 4 (Week 1): Pt will brush teeth without red foam handle to demonstrate improved gross grasp  Skilled Therapeutic Interventions/Progress Updates:    Pt worked on UB bathing, UB dressing, and grooming tasks at the sink.  Pt reporting an improvement in pain in all areas, especially his feet, knees, and hands.  Noted slight increase in AROM of the right digits with regard to flexion.  He was able to use a wash mit on the left hand to wash the right arm and underarm with mod assist for shoulder AROM.  Supervision to wash the left arm, abdomen, and chest.  Utilized LH sponge for washing his lower legs and feet as well as reacher with use of towel to dry them.  Setup needed to apply soap and water to sponge and squeeze out as well as for wrapping towel around the reacher.  He was able to [employer] with supervision and increased time.  Mod assist for applying deodorant under the right arm.  Therapist applied coban around deodorant as well to help with grip.  Pt next performed grooming tasks of brushing his teeth and combing his hair.  Built up handles were placed on the toothbrush and the comb.  He completed both with overall supervision, but did need min assist of attempting to comb the left side of his head with the LUE.  Attempted sit to stand X1 from wheelchair with use of the Steady, but unsuccessful.   Finished session with pt in wheelchair with call button and phone in reach.    Therapy Documentation Precautions:  Precautions Precautions: Fall Precaution Comments: Severe RA in shoulders, left hand Required Braces or Orthoses: Other Brace/Splint Other Brace/Splint: Bilateral hinged knee braces for comfort and support. Restrictions Weight Bearing Restrictions: No  Pain: Pain Assessment Pain Assessment: 0-10 Pain Score: 5  Pain Type: Chronic pain Pain Location: Generalized Pain Orientation: Left;Right Pain Descriptors / Indicators: Constant;Burning Pain Onset: On-going ADL: See Function Navigator for Current Functional Status.   Therapy/Group: Individual Therapy  Derica Leiber OTR/L 05/20/2017, 11:09 AM

## 2017-05-20 NOTE — Progress Notes (Signed)
[facility] Rounding Note  Subjective: For HD today No new complaints  Objective: Vital signs in last 24 hours: Temp:  [97.8 F (36.6 C)-98.9 F (37.2 C)] 97.8 F (36.6 C) (03/07 0500) Pulse Rate:  [77-89] 80 (03/07 0806) Resp:  [16-18] 18 (03/07 0500) BP: (113-136)/(65-79) 136/79 (03/07 0806) SpO2:  [95 %] 95 % (03/07 0500) Weight:  [126.4 kg (278 lb 10.6 oz)] 126.4 kg (278 lb 10.6 oz) (03/07 0500) Weight change: -2.4 kg (-4.7 oz) Lying in bed NAD  Ant clear S1S2 No S3 + 2/6 murmur LSB Chronic skin changes LE's 2+ pitting edema Legs dyesethetic Compression glove on left hand L AVF + bruit (2/25) R IJ TDC (2/19) dry dressing in place   Labs to be done at HD  Recent Labs    05/18/17 1617  NA 130*  K 3.0*  CL 95*  CO2 20*  GLUCOSE 190*  BUN 75*  CREATININE 7.92*  CALCIUM 8.9  PHOS 5.0*    Recent Labs    05/18/17 1617  ALBUMIN 2.2*   CBC: Recent Labs    05/18/17 1617  WBC 30.0*  HGB 8.8*  HCT 25.1*  MCV 79.2  PLT 350   Medications . Abatacept  125 mg Subcutaneous Q Tue  . acyclovir  200 mg Oral BID  . amiodarone  200 mg Oral Daily  . apixaban  5 mg Oral BID  . calcitRIOL  0.5 mcg Oral Q T,Th,Sa-HD  . capsaicin   Topical BID  . [START ON 05/25/2017] darbepoetin (ARANESP) injection - DIALYSIS  150 mcg Intravenous Q Tue-HD  . fluticasone  2 spray Each Nare Daily  . gabapentin  300 mg Oral Daily  . Gerhardt's butt cream   Topical BID  . insulin aspart  0-15 Units Subcutaneous TID WC  . insulin aspart  6 Units Subcutaneous TID WC  . insulin glargine  25 Units Subcutaneous QHS  . lidocaine  3 patch Transdermal Q24H  . multivitamin  1 tablet Oral QHS  . predniSONE  60 mg Oral Q breakfast  . senna-docusate  2 tablet Oral QHS    Background: PMH systolic HF EF 35%, DM2, HLD, RA, known CKD. HD initiated this admisson for renal failure,  massive vol overload. Access TDC placed 2/19, L RC AVF 05/10/17. CLIPPED to [facility] MWF. On rehab d/t severe deconditioning.      Assessment/Plan:  1. ESRD  Still vol xs. 4L off last TMT. HD today  (Has MWF spot as outpt but will keep on TTS for nww). EDW  - working toward. Try for another 4 liters today. Lowest post HD weight so far 124 kg 2. Anemia Darbe 100 QTuesday with HD. ^ dose to 150 3. HPTH calcitriol QOD - changed to HD TTS. PTH 230 on 05/11/17 4. DM controlled 5. Afib on amio/apixaban 6. Obesity 7. DJD 8. RA - on pred 10/gaba/topicals 9. Neuropathy 10. Leukocytosis WBC 30K 3/5 and not repeated. Source of this??  Jamal Maes, MD [facility] [phone] Pager 05/20/2017, 12:13 PM

## 2017-05-20 NOTE — Progress Notes (Signed)
Physical Therapy Session Note  Patient Details  Name: John Bailey MRN: [id number] Date of Birth: 01/24/1971  Today's Date: 05/20/2017 PT Individual Time: 0800-0910 PT Individual Time Calculation (min): 70 min   Short Term Goals: Week 1:  PT Short Term Goal 1 (Week 1): Pt will tolerate 30 min of OOB activity w/o increase in pain level or fatigue PT Short Term Goal 2 (Week 1): Pt will initiate gait training  PT Short Term Goal 3 (Week 1): Pt will transfer bed<>chair via stand pivot w/ RW, Max assist PT Short Term Goal 4 (Week 1): Pt will self-propel w/c 50' w/ min assist PT Short Term Goal 5 (Week 1): Pt will maintain static sitting during functional activity w/ supervision  Skilled Therapeutic Interventions/Progress Updates:   Pt in supine and agreeable to therapy, c/o pain as 5/10 at rest. Focused on tolerance to OOB activity this session, including tolerance to standing and WB through LEs. Total assist to don pants and shoes, mod assist to roll during this time. Min assist to transfer to EOB w/ heavy use of hospital bed functions. Stabilizes in seated w/ close supervision using UEs to adjust and scoot. Transferred to w/c, total assist x2 via slide board, 2nd helper stabilizing w/c and slide board. Mod-max assist to scoot back in w/c w/ verbal and visual cues for technique to scoot. Pt self-propelled w/c in multiple 100' bouts to/from dayroom using BUEs, requires mod assist for propulsion and increased time to perform task. Stood in standing frame 4 min and 5 min w/ UE support on frame, however able to achieve full trunk upright w/ verbal and visual cues only and maintain trunk upright in 30-60 sec bouts before needing a brief break to lean over. Pain in feet 6/10 in standing, much improved from previous sessions and pt able to better tolerate therapist touching and moving feet this session. Returned to room in w/c, pt agreeable to stay up in w/c until next session to work on OOB tolerance. Ended  session in w/c, call bell within reach and all needs met. Verbalized understanding that he needs to call for assistance if he needs to adjust himself in the chair.   Therapy Documentation Precautions:  Precautions Precautions: Fall Precaution Comments: Severe RA in shoulders, left hand Required Braces or Orthoses: Other Brace/Splint Other Brace/Splint: Bilateral hinged knee braces for comfort and support. Restrictions Weight Bearing Restrictions: No Vital Signs: Therapy Vitals Pulse Rate: 80 BP: 136/79 Patient Position (if appropriate): Sitting Pain: Pain Assessment Pain Assessment: 0-10 Pain Score: 5  Pain Type: Chronic pain Pain Location: Foot Pain Orientation: Left;Right Pain Descriptors / Indicators: Constant;Burning Pain Onset: On-going  See Function Navigator for Current Functional Status.   Therapy/Group: Individual Therapy  Araseli Sherry K Arnette 05/20/2017, 9:14 AM

## 2017-05-20 NOTE — Progress Notes (Signed)
Physical Therapy Weekly Progress Note  Patient Details  Name: KRIKOR WILLET MRN: [id number] Date of Birth: May 15, 1970  Beginning of progress report period: May 14, 2017 End of progress report period: May 20, 2017  Patient has met 3 of 5 short term goals. Pt has been limited w/ functional mobility since admission 2/2 bilateral foot pain that increases in WB. He requires total assist +2 to transfer via slide board, min-mod assist for bed mobility, max-total assist +2 to stand, and up to mod assist for w/c propulsion. He has not initiated gait. Despite this, he is tolerating multiple hours of OOB activity per day and motivated to improve his functional level. Pain has decreased in past 24-48 hours (suspect 2/2 RA medication changes), allowing pt to participate more in functional mobility, however remains fearful of any LE WB activity. Wife and/or other caregivers have not been present for any family education.   Patient continues to demonstrate the following deficits muscle weakness and muscle joint tightness, decreased cardiorespiratoy endurance and decreased sitting balance, decreased standing balance, decreased postural control and decreased balance strategies and therefore will continue to benefit from skilled PT intervention to increase functional independence with mobility.  Patient progressing toward long term goals..  Plan of care revisions: gait and stairs goals downgraded 2/2 lack of progress.  PT Short Term Goals Week 1:  PT Short Term Goal 1 (Week 1): Pt will tolerate 30 min of OOB activity w/o increase in pain level or fatigue PT Short Term Goal 1 - Progress (Week 1): Met PT Short Term Goal 2 (Week 1): Pt will initiate gait training  PT Short Term Goal 2 - Progress (Week 1): Not met PT Short Term Goal 3 (Week 1): Pt will transfer bed<>chair via stand pivot w/ RW, Max assist PT Short Term Goal 3 - Progress (Week 1): Not met PT Short Term Goal 4 (Week 1): Pt will self-propel w/c 50'  w/ min assist PT Short Term Goal 4 - Progress (Week 1): Met PT Short Term Goal 5 (Week 1): Pt will maintain static sitting during functional activity w/ supervision PT Short Term Goal 5 - Progress (Week 1): Met Week 2:  PT Short Term Goal 1 (Week 2): Pt will initiate gait training w/ LRAD PT Short Term Goal 2 (Week 2): Pt will transfer sit<>stand max assist x1 using LRAD PT Short Term Goal 3 (Week 2): Pt will transfer bed<>chair w/ max assist x1 using LRAD PT Short Term Goal 4 (Week 2): Pt will demonstrate scooting and adjusting in w/c for pressure relief and comfort w/ mod assist PT Short Term Goal 5 (Week 2): Pt will maintain static stance w/ BUE support on RW w/ min assist for 30 sec at a time  Keshanna Riso K Arnette 05/20/2017, 6:15 PM

## 2017-05-21 ENCOUNTER — Inpatient Hospital Stay (HOSPITAL_COMMUNITY): Payer: BLUE CROSS/BLUE SHIELD | Admitting: Occupational Therapy

## 2017-05-21 ENCOUNTER — Inpatient Hospital Stay (HOSPITAL_COMMUNITY): Payer: BLUE CROSS/BLUE SHIELD | Admitting: Physical Therapy

## 2017-05-21 LAB — GLUCOSE, CAPILLARY
GLUCOSE-CAPILLARY: 300 mg/dL — AB (ref 65–99)
Glucose-Capillary: 174 mg/dL — ABNORMAL HIGH (ref 65–99)
Glucose-Capillary: 272 mg/dL — ABNORMAL HIGH (ref 65–99)
Glucose-Capillary: 221 mg/dL — ABNORMAL HIGH (ref 65–99)

## 2017-05-21 MED ORDER — INSULIN GLARGINE 100 UNIT/ML ~~LOC~~ SOLN
30.0000 [IU] | Freq: Every day | SUBCUTANEOUS | Status: DC
  Administered 2017-05-21 – 2017-05-26 (×6): 30 [IU] via SUBCUTANEOUS
  Filled 2017-05-21 (×6): qty 0.3

## 2017-05-21 MED ORDER — PREDNISONE 20 MG PO TABS: 20.0000 mg | ORAL_TABLET | Freq: Every day | ORAL | Status: DC

## 2017-05-21 MED ORDER — PREDNISONE 10 MG PO TABS
50.0000 mg | ORAL_TABLET | Freq: Every day | ORAL | Status: AC
  Administered 2017-05-26 – 2017-06-01 (×7): 50 mg via ORAL
  Filled 2017-05-21 (×8): qty 2

## 2017-05-21 MED ORDER — PREDNISONE 10 MG PO TABS: 10.0000 mg | ORAL_TABLET | Freq: Every day | ORAL | Status: DC

## 2017-05-21 MED ORDER — PREDNISONE 20 MG PO TABS
60.0000 mg | ORAL_TABLET | Freq: Every day | ORAL | Status: AC
  Administered 2017-05-22 – 2017-05-25 (×4): 60 mg via ORAL
  Filled 2017-05-21 (×5): qty 3

## 2017-05-21 MED ORDER — PREDNISONE 20 MG PO TABS
40.0000 mg | ORAL_TABLET | Freq: Every day | ORAL | Status: DC
  Administered 2017-06-02 – 2017-06-03 (×2): 40 mg via ORAL
  Filled 2017-05-21 (×2): qty 2

## 2017-05-21 MED ORDER — PREDNISONE 20 MG PO TABS: 30.0000 mg | ORAL_TABLET | Freq: Every day | ORAL | Status: DC

## 2017-05-21 NOTE — Progress Notes (Signed)
Occupational Therapy Weekly Progress Note  Patient Details  Name: John Bailey MRN: [id number] Date of Birth: 1970-10-17  Beginning of progress report period: May 14, 2017 End of progress report period: May 21, 2017  Today's Date: 05/21/2017 OT Individual Time: 1400-1450 OT Individual Time Calculation (min): 50 min    Patient has met 0 of 4 short term goals.  Pt is making slow progress with OT with regards to functional mobility and selfcare independence sit to stand.  At this time he requires assistance of the [name] for sit to stand as he cannot complete this consistently with use of the RW and platform on the left side.  He is able to perform UB bathing and dressing at an overall min assist level with use of a wash mit on the left hand as well as a LH sponge for washing the RUE and underarm.  He uses the [facility] sponge as well for washing his LEs and the reacher for drying the lower legs and feet.  Total assist for washing peri area in bed and for donning all LB clothing at this time.  Pt is motivated to participate but is limited mostly by joint pain from the rhematoid arthritis which affects his feet, knees, shoulders, wrists, and fingers.  He demonstrates minimal AROM in the left hand for functional use but has been able to incorporate it as a gross assist for some selfcare.  He relies mostly on the RUE for self feeding and grooming tasks.  Therapists have also adapted his utensils, toothbrush, and comb with built up foam handles to assist with grip.  Feel he needs continued OT at CIR level therapy but anticipate progress will be dependent on pain level and tolerance.  Anticipate pt will need at least mod assist or greater at discharge for LB selfcare sit to stand or for functional transfers.    Patient continues to demonstrate the following deficits: muscle weakness and muscle joint tightness and decreased standing balance and decreased balance strategies and therefore will continue to benefit  from skilled OT intervention to enhance overall performance with BADL and Reduce care partner burden.  Patient not progressing toward long term goals.  See goal revision..  Plan of care revisions: goals downgraded.  OT Short Term Goals Week 2:  OT Short Term Goal 1 (Week 2): Pt will complete sit to stand with max assist in preparation for LB selfcare tasks.  OT Short Term Goal 2 (Week 2): Pt will complete sliding board transfer with max assist from bed to wheelchair or drop arm commode. OT Short Term Goal 3 (Week 2): Pt will donn pullup shorts with total assist +2 (pt 40%) sit to stand.   OT Short Term Goal 4 (Week 2): Pt will complete LB bathing with total assist +2 (pt 40%) sit to stand.    Skilled Therapeutic Interventions/Progress Updates:    Pt completed sit to stand and short distance functional mobility with use of the [facility] during session.  He was able to tolerate 5 intervals of standing for 2-5 mins each, with 2 intervals involving walking approximately 5 feet each with use of the Steady and +2 assistance.  Pt still demonstrating decreased hip extension when standing in the steady but could achieve and maintain bilateral knee extension.  Finished session with pt up in the wheelchair at bedside with visitor and call button and phone in reach.    Therapy Documentation Precautions:  Precautions Precautions: Fall Precaution Comments: Severe RA in shoulders, left  hand Required Braces or Orthoses: Other Brace/Splint Other Brace/Splint: Bilateral hinged knee braces for comfort and support. Restrictions Weight Bearing Restrictions: No  Pain: Pain Assessment Pain Assessment: Faces Faces Pain Scale: Hurts even more Pain Type: Chronic pain Pain Location: Shoulder Pain Orientation: Left Pain Descriptors / Indicators: Grimacing;Discomfort Pain Onset: With Activity Pain Intervention(s): Repositioned ADL: See Function Navigator for Current Functional Status.   Therapy/Group:  Individual Therapy  Montey Ebel OTR/L 05/21/2017, 4:21 PM

## 2017-05-21 NOTE — Progress Notes (Signed)
[facility] Rounding Note  Subjective:  Appears pt misunderstood his outpt HD schedule Current assignment is TTS [place] (not MWF) So current inpt schedule OK  Objective: Vital signs in last 24 hours: Temp:  [97.4 F (36.3 C)-97.9 F (36.6 C)] 97.6 F (36.4 C) (03/08 1445) Pulse Rate:  [70-80] 80 (03/08 1445) Resp:  [16-18] 18 (03/08 1445) BP: (106-134)/(72-84) 134/78 (03/08 1445) SpO2:  [97 %-100 %] 97 % (03/08 1445) Weight:  [121.5 kg (267 lb 13.7 oz)-121.9 kg (268 lb 11.9 oz)] 121.5 kg (267 lb 13.7 oz) (03/08 0456) Weight change: -2.1 kg (-10.1 oz)  NAD  Ant clear S1S2 No S3 + 2/6 murmur LSB Chronic skin changes LE's 2+ pitting edema Legs dyesethetic Compression glove on left hand L AVF + bruit (2/25) R IJ TDC (2/19) dry dressing in place    Recent Labs    05/20/17 1230  NA 130*  K 3.4*  CL 91*  CO2 21*  GLUCOSE 297*  BUN 71*  CREATININE 6.96*  CALCIUM 9.1  PHOS 5.5*   Recent Labs    05/20/17 1230  ALBUMIN 2.3*   Recent Labs    05/20/17 1230  WBC 27.6*  HGB 8.4*  HCT 25.0*  MCV 79.4  PLT 404*   Medications . Abatacept  125 mg Subcutaneous Q Tue  . acyclovir  200 mg Oral BID  . amiodarone  200 mg Oral Daily  . apixaban  5 mg Oral BID  . calcitRIOL  0.5 mcg Oral Q T,Th,Sa-HD  . capsaicin   Topical BID  . [START ON 05/25/2017] darbepoetin (ARANESP) injection - DIALYSIS  150 mcg Intravenous Q Tue-HD  . fluticasone  2 spray Each Nare Daily  . gabapentin  300 mg Oral Daily  . Gerhardt's butt cream   Topical BID  . insulin aspart  0-15 Units Subcutaneous TID WC  . insulin aspart  6 Units Subcutaneous TID WC  . insulin glargine  30 Units Subcutaneous QHS  . lidocaine  3 patch Transdermal Q24H  . multivitamin  1 tablet Oral QHS  . predniSONE  60 mg Oral Q breakfast  . senna-docusate  2 tablet Oral QHS    Background: PMH systolic HF EF 23%, DM2, HLD, RA, known CKD. HD initiated this admisson for renal failure,  massive vol overload. Access TDC placed  2/19, L RC AVF 05/10/17. CLIPPED to [facility] MWF. On rehab d/t severe deconditioning.     Assessment/Plan:  1. New ESRD  Still vol xs. 4L off last couple of TMT's. Appears pt misunderstood outpt schedule. He is in fact TTS at [facility]. Establishing EDW. Lowest post weight so far 121.5 kg. 4K bath with HD Saturday. 2. Anemia Darbe 100 QTuesday with HD. ^ d dose to 150 3. HPTH calcitriol QOD - changed to HD TTS. PTH 230 on 05/11/17 4. DM controlled 5. Afib on amio/apixaban 6. Obesity 7. DJD 8. RA - on pred 10/gaba/topicals 9. Neuropathy 10. Leukocytosis WBC down some yesterday 27K. Source ?[name]  Jamal Maes, MD [facility] [phone] Pager 05/21/2017, 4:50 PM

## 2017-05-21 NOTE — Progress Notes (Signed)
Occupational Therapy Session Note  Patient Details  Name: John Bailey MRN: [id number] Date of Birth: May 29, 1970  Today's Date: 05/21/2017 OT Individual Time: 1005-1100 OT Individual Time Calculation (min): 55 min    Short Term Goals: Week 1:  OT Short Term Goal 1 (Week 1): Pt will sit to stand wiht 1 caregiver wiht LRAD to decrease caregiver burden OT Short Term Goal 2 (Week 1): Pt will transfer to w/c wiht MAX A of 1 caregiver in prep for [facility] transfer OT Short Term Goal 3 (Week 1): Pt will complete clohting management prior to toileting with MOD A for standing balance OT Short Term Goal 4 (Week 1): Pt will brush teeth without red foam handle to demonstrate improved gross grasp  Skilled Therapeutic Interventions/Progress Updates:    Pt completed UB bathing, dressing, and grooming tasks at the sink from seated position.  Pt utilized LH sponge for washing under the right arm and for washing the right arm itself secondary to not being able to reach it with the LUE.  He also utilized wash mit on the left hand to help with washing the right lower part of the arm.  Therapist assisted with washing his back and then he used the LH sponge and reacher for washing and drying the lower legs and feet.  He was able to donn a pullover shirt with min assist for pulling it down in the back.  He was able to brush his teeth with setup using toothbrush with built up handle.  He also combed his hair as well.  Mod assist for application of deodorant under the right arm using the LUE.  Finished session with therapist working on gentle left hand mobilizations and stretching to increase digit flexion.  Finished session with application of heat pack to the left hand and call button and phone in reach.    Therapy Documentation Precautions:  Precautions Precautions: Fall Precaution Comments: Severe RA in shoulders, left hand Required Braces or Orthoses: Other Brace/Splint Other Brace/Splint: Bilateral hinged knee  braces for comfort and support. Restrictions Weight Bearing Restrictions: No   Pain: Pain Assessment Pain Assessment: Faces Faces Pain Scale: Hurts whole lot Pain Type: Chronic pain Pain Location: Hand Pain Orientation: Left Pain Descriptors / Indicators: Discomfort;Grimacing Pain Onset: With Activity Pain Intervention(s): Heat applied;Repositioned ADL: See Function Navigator for Current Functional Status.   Therapy/Group: Individual Therapy  Riann Oman OTR/L 05/21/2017, 12:32 PM

## 2017-05-21 NOTE — Progress Notes (Signed)
Physical Therapy Session Note  Patient Details  Name: John Bailey MRN: [id number] Date of Birth: 07/29/1970  Today's Date: 05/21/2017 PT Individual Time: 0800-0900 PT Individual Time Calculation (min): 60 min   Short Term Goals: Week 2:  PT Short Term Goal 1 (Week 2): Pt will initiate gait training w/ LRAD PT Short Term Goal 2 (Week 2): Pt will transfer sit<>stand max assist x1 using LRAD PT Short Term Goal 3 (Week 2): Pt will transfer bed<>chair w/ max assist x1 using LRAD PT Short Term Goal 4 (Week 2): Pt will demonstrate scooting and adjusting in w/c for pressure relief and comfort w/ mod assist PT Short Term Goal 5 (Week 2): Pt will maintain static stance w/ BUE support on RW w/ min assist for 30 sec at a time  Skilled Therapeutic Interventions/Progress Updates:    Pt semi-reclined in bed, agreeable to participate in therapy session. Pt reports no pain this AM. Rolling L/R with max assist and use of bedrails to dependently don pants. Supine to sit min assist with HOB elevated all the way and use of bedrails. Pt is able to don shirt independently while seated EOB. Total assist to don shoes to save time. Sliding board transfer bed to w/c with assist x 2: one person to steady sliding board and w/c, min assist from second person for weight shift. Pt needs increased time and cues but is able to physically assist more with sliding board transfer this date. Sit to stand in standing frame x 2 reps: 2 min, 7 min. Pt left seated in w/c in room with needs in reach at end of therapy session.  Therapy Documentation Precautions:  Precautions Precautions: Fall Precaution Comments: Severe RA in shoulders, left hand Required Braces or Orthoses: Other Brace/Splint Other Brace/Splint: Bilateral hinged knee braces for comfort and support. Restrictions Weight Bearing Restrictions: No  See Function Navigator for Current Functional Status.   Therapy/Group: Individual Therapy  Excell Seltzer, PT,  DPT  05/21/2017, 12:04 PM

## 2017-05-21 NOTE — Progress Notes (Signed)
Occupational Therapy Session Note  Patient Details  Name: John Bailey MRN: [id number] Date of Birth: September 24, 1970  Today's Date: 05/20/2017 OT Individual Time:  - 11:00-12:00      Short Term Goals: Week 1:  OT Short Term Goal 1 (Week 1): Pt will sit to stand wiht 1 caregiver wiht LRAD to decrease caregiver burden OT Short Term Goal 2 (Week 1): Pt will transfer to w/c wiht MAX A of 1 caregiver in prep for [facility] transfer OT Short Term Goal 3 (Week 1): Pt will complete clohting management prior to toileting with MOD A for standing balance OT Short Term Goal 4 (Week 1): Pt will brush teeth without red foam handle to demonstrate improved gross grasp Week 2:     Skilled Therapeutic Interventions/Progress Updates:    1:1 Received pt in w/c. Started with applying small  Heating packs into bilateral palms before beginning. Applied kinesotape to left hand and forear, (avoiding HD graft) to help decr swelling/ aid with edema control. Pt performed slide board transfer w/c to mat with total A +2 (pt mod A) with focus on head hip relationship and maintain forward weight shift and weight bearing through [place]. AT EOM performed UE and neck exercises without weight. Performing AAROm with hula hoop trying to increase ROM to left UE.  Performed sit to stands and standing tolerance in the SARA with walking sling for more support. Pt able to tolerate standing with decreasing UE support the longer he stood. Pt with improved hip and need extension. Transferred into w/c with SARA.  Max +2 transfer back into bed via slide board and setup in prep for lunch and then HD.   Therapy Documentation Precautions:  Precautions Precautions: Fall Precaution Comments: Severe RA in shoulders, left hand Required Braces or Orthoses: Other Brace/Splint Other Brace/Splint: Bilateral hinged knee braces for comfort and support. Restrictions Weight Bearing Restrictions: No Pain: Pain Assessment Pain Assessment: No/denies pain Pain  Score: 0-No pain Other Treatments:    See Function Navigator for Current Functional Status.   Therapy/Group: Individual Therapy  Willeen Cass [facility] 05/21/2017, 9:25 AM

## 2017-05-21 NOTE — Progress Notes (Signed)
[place] PHYSICAL MEDICINE & REHABILITATION     PROGRESS NOTE  Subjective/Complaints:  Pt seen lying in bed this AM.  He states he slept well overnight.  He notes he feels better.   ROS: Denies CP, SOB, nausea, vomiting, diarrhea.  Objective: Vital Signs: Blood pressure 118/72, pulse 76, temperature 97.9 F (36.6 C), temperature source Oral, resp. rate 18, height 6' (1.829 m), weight 121.5 kg (267 lb 13.7 oz), SpO2 100 %. No results found. Recent Labs    05/18/17 1617 05/20/17 1230  WBC 30.0* 27.6*  HGB 8.8* 8.4*  HCT 25.1* 25.0*  PLT 350 404*   Recent Labs    05/18/17 1617 05/20/17 1230  NA 130* 130*  K 3.0* 3.4*  CL 95* 91*  GLUCOSE 190* 297*  BUN 75* 71*  CREATININE 7.92* 6.96*  CALCIUM 8.9 9.1   CBG (last 3)  Recent Labs    05/20/17 1744 05/20/17 2200 05/21/17 0711  GLUCAP 172* 177* 174*    Wt Readings from Last 3 Encounters:  05/21/17 121.5 kg (267 lb 13.7 oz)  05/13/17 132.1 kg (291 lb 3.6 oz)  04/02/17 (!) 154.9 kg (341 lb 9.6 oz)    Physical Exam:  BP 118/72 (BP Location: Left Arm)   Pulse 76   Temp 97.9 F (36.6 C) (Oral)   Resp 18   Ht 6' (1.829 m)   Wt 121.5 kg (267 lb 13.7 oz)   SpO2 100%   BMI 36.33 kg/m  Constitutional: Obese male. NAD.  HENT: Normocephalic. Atraumatic. Oral: Blisters with ulceration noted on lips and tongue Eyes:EOMare normal. No discharge.  Cardiovascular: RRR. No JVD. Respiratory:Effort normal and breath sounds normal.  GI: Bowel sounds are normal. + Distention.  Musc:  Bouchard's deformities  TTP left hand with edema +Edema b/l LE Neurological: Alert and oriented.  Motor: Right upper extremity 4-/5 proximal to distal (pain inhibition) Left upper extremity: Shoulder abduction, elbow flexion/extension 4-/5, hand grip 2-/5 (pain inhibition) Bilateral lower extremities: 3-/5 proximal to distal (some pain inhibition, improving)  Sensation is intact to light touch bilateral upper and lower limbs Skin. LE  ischemic changes.   Assessment/Plan: 1. Functional deficits secondary to debility which require 3+ hours per day of interdisciplinary therapy in a comprehensive inpatient rehab setting. Physiatrist is providing close team supervision and 24 hour management of active medical problems listed below. Physiatrist and rehab team continue to assess barriers to discharge/monitor patient progress toward functional and medical goals.  Function:  Bathing Bathing position   Position: Wheelchair/chair at sink  Bathing parts Body parts bathed by patient: Right arm, Chest, Abdomen, Left upper leg, Right lower leg Body parts bathed by helper: Back  Bathing assist Assist Level: 2 helpers(MOD A; 2 helpers sit to stand for buttock hygiene)      Upper Body Dressing/Undressing Upper body dressing   What is the patient wearing?: Pull over shirt/dress     Pull over shirt/dress - Perfomed by patient: Thread/unthread right sleeve, Thread/unthread left sleeve, Put head through opening, Pull shirt over trunk Pull over shirt/dress - Perfomed by helper: Put head through opening, Pull shirt over trunk        Upper body assist Assist Level: Supervision or verbal cues(per OT note)      Lower Body Dressing/Undressing Lower body dressing Lower body dressing/undressing activity did not occur: N/A What is the patient wearing?: Pants, Underwear   Underwear - Performed by helper: Thread/unthread right underwear leg, Thread/unthread left underwear leg, Pull underwear up/down   Pants- Performed  by helper: Thread/unthread right pants leg, Thread/unthread left pants leg           Shoes - Performed by helper: Don/doff right shoe, Don/doff left shoe(slippers)          Lower body assist Assist for lower body dressing: 2 Helpers      Toileting Toileting     Toileting steps completed by helper: Adjust clothing prior to toileting, Performs perineal hygiene, Adjust clothing after toileting    Toileting assist      Transfers Chair/bed transfer Chair/bed transfer activity did not occur: Refused Chair/bed transfer method: Lateral scoot Chair/bed transfer assist level: 2 helpers Chair/bed transfer assistive device: Sliding board, Armrests Mechanical lift: Stedy   Locomotion Ambulation Ambulation activity did not occur: Scientist, research (physical sciences) activity did not occur: Refused Type: Manual Max wheelchair distance: 100' Assist Level: Moderate assistance (Pt 50 - 74%)  Cognition Comprehension Comprehension assist level: Follows complex conversation/direction with no assist  Expression Expression assist level: Expresses complex ideas: With no assist  Social Interaction Social Interaction assist level: Interacts appropriately with others - No medications needed.  Problem Solving Problem solving assist level: Solves complex problems: Recognizes & self-corrects  Memory Memory assist level: Complete Independence: No helper    Medical Problem List and Plan: 1.Debilitysecondary to cardiorenal syndrome, synovitis left hand due to RA   Cont CIR    2. DVT Prophylaxis/Anticoagulation: Eliquis. Monitor for any bleeding episodes 3. Pain Management:Neurontin 300 mg daily, Robaxin as needed   Voltaren gel added for joint pain.   Neuropathic pain Left hand - cont gabapentin, started on capsasin cream .025%   Lidoderm patch added on 3/5   Improving 4. Mood:Provide emotional support 5. Neuropsych: This patientiscapable of making decisions on hisown behalf. 6. Skin/Wound Care:Routine skin checks 7. Fluids/Electrolytes/Nutrition:Routine I&O's 8.End-stage renal disease. Hemodialysis initiated Tuesday Thursday Saturday schedule. Patient with left AVFgraft placed 05/10/2017   Appreciate nephro recs. 9.Acute on chronic anemia. Continue Aranesp.    Hb 8.8 on 3/5   Cont to monitor 10.Steroid induced hyperglycemia on Diabetes mellitus with peripheral neuropathy. Latest hemoglobin A1c  9.2. Diabetic teaching   Lantus insulin 25 units daily at bedtime, increased to 30 on 3/8, will need to decrease with taper of steroids   NovoLog 3 units 3 times a day, increased to 6 on 3/4.    Monitor with increased mobility   Elevated on 3/8 11.Acute on chronic diastolic congestive heart failure. Monitor for any signs of fluid overload. Continue Coreg 6.25mg  BID  Filed Weights   05/20/17 1235 05/20/17 1713 05/21/17 0456  Weight: 126.4 kg (278 lb 10.6 oz) 121.9 kg (268 lb 11.9 oz) 121.5 kg (267 lb 13.7 oz)     Unreliable 12.Rheumatoid arthritis. Prednisone 10 mg daily. Patient had been receiving Abataceptevery Tuesday prior to admission. Added Voltaren gel. Follow-up rheumatologist Dr.Ziokowska   MRI right knee reviewed, showing OA, meniscal tear and patellar tendinosis on right.  Per Ortho, braces PRN   DMARD resumed on 3/5    Started on high-dose 6 week steroid taper on 3/6 after discussion with therapies during team conference on 3/6 13.Atrial fibrillation RVR. Status post cardioversion. Amiodarone decreased to 200 mg daily 3/3 14.Leukocytosis. Felt to be related to prednisone.    Low-grade fever    WBCs 30.0 on 3/5   Hand films reviewed, suggesting RA changes   Cont to monitor 15.  Left hand numbness   Likely related to fistula placement  16. 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 Constipation   Bowel regimen increased on 3/7  LOS (Days) 8 A FACE TO FACE EVALUATION WAS PERFORMED  Nissa Stannard Lorie Phenix 05/21/2017 8:54 AM

## 2017-05-21 NOTE — Progress Notes (Signed)
Physical Therapy Session Note  Patient Details  Name: John Bailey MRN: [id number] Date of Birth: 08-28-1970  Today's Date: 05/21/2017 PT Individual Time: 1500-1530 PT Individual Time Calculation (min): 30 min   Short Term Goals: Week 2:  PT Short Term Goal 1 (Week 2): Pt will initiate gait training w/ LRAD PT Short Term Goal 2 (Week 2): Pt will transfer sit<>stand max assist x1 using LRAD PT Short Term Goal 3 (Week 2): Pt will transfer bed<>chair w/ max assist x1 using LRAD PT Short Term Goal 4 (Week 2): Pt will demonstrate scooting and adjusting in w/c for pressure relief and comfort w/ mod assist PT Short Term Goal 5 (Week 2): Pt will maintain static stance w/ BUE support on RW w/ min assist for 30 sec at a time  Skilled Therapeutic Interventions/Progress Updates:    Pt seated in w/c in room, agreeable to participate in therapy session. Pt reports 5/10 pain in B feet, much improved from previous days. Sliding board transfer w/c to bed with assist x 2: one person stabilizing sliding board and w/c, mod assist from second person for weight shift and actual transfer. Sit to supine total assist x 2 for LE and trunk management. Pt is able to scoot himself up in bed in Trendelenburg position. Rolling L/R with max assist and use of bedrails to reposition bedsheets. Supine BLE therex x 10 reps. Pt left supine in bed with needs in reach and bed alarm in place. Pt exhibits improved tolerance and endurance for therapy this date and was able to tolerate being out of bed for 7 hours.  Therapy Documentation Precautions:  Precautions Precautions: Fall Precaution Comments: Severe RA in shoulders, left hand Required Braces or Orthoses: Other Brace/Splint Other Brace/Splint: Bilateral hinged knee braces for comfort and support. Restrictions Weight Bearing Restrictions: No   See Function Navigator for Current Functional Status.   Therapy/Group: Individual Therapy  Excell Seltzer, PT,  DPT  05/21/2017, 3:52 PM

## 2017-05-22 ENCOUNTER — Inpatient Hospital Stay (HOSPITAL_COMMUNITY): Payer: BLUE CROSS/BLUE SHIELD | Admitting: Physical Therapy

## 2017-05-22 LAB — RENAL FUNCTION PANEL
Albumin: 2.4 g/dL — ABNORMAL LOW (ref 3.5–5.0)
Anion gap: 16 — ABNORMAL HIGH (ref 5–15)
BUN: 89 mg/dL — ABNORMAL HIGH (ref 6–20)
CALCIUM: 8.5 mg/dL — AB (ref 8.9–10.3)
CO2: 21 mmol/L — AB (ref 22–32)
Chloride: 92 mmol/L — ABNORMAL LOW (ref 101–111)
Creatinine, Ser: 6.29 mg/dL — ABNORMAL HIGH (ref 0.61–1.24)
GFR calc Af Amer: 11 mL/min — ABNORMAL LOW (ref >60)
GFR calc non Af Amer: 10 mL/min — ABNORMAL LOW (ref >60)
GLUCOSE: 261 mg/dL — AB (ref 65–99)
Phosphorus: 6.1 mg/dL — ABNORMAL HIGH (ref 2.5–4.6)
Potassium: 3.9 mmol/L (ref 3.5–5.1)
SODIUM: 129 mmol/L — AB (ref 135–145)

## 2017-05-22 LAB — GLUCOSE, CAPILLARY
GLUCOSE-CAPILLARY: 157 mg/dL — AB (ref 65–99)
Glucose-Capillary: 204 mg/dL — ABNORMAL HIGH (ref 65–99)
Glucose-Capillary: 209 mg/dL — ABNORMAL HIGH (ref 65–99)

## 2017-05-22 LAB — CBC
HCT: 27 % — ABNORMAL LOW (ref 39.0–52.0)
Hemoglobin: 9 g/dL — ABNORMAL LOW (ref 13.0–17.0)
MCH: 26.9 pg (ref 26.0–34.0)
MCHC: 33.3 g/dL (ref 30.0–36.0)
MCV: 80.8 fL (ref 78.0–100.0)
PLATELETS: 549 10*3/uL — AB (ref 150–400)
RBC: 3.34 MIL/uL — ABNORMAL LOW (ref 4.22–5.81)
RDW: 20.3 % — ABNORMAL HIGH (ref 11.5–15.5)
WBC: 18.9 10*3/uL — ABNORMAL HIGH (ref 4.0–10.5)

## 2017-05-22 LAB — ALT: ALT: 12 U/L — ABNORMAL LOW (ref 17–63)

## 2017-05-22 MED ORDER — HEPARIN SODIUM (PORCINE) 1000 UNIT/ML DIALYSIS
20.0000 [IU]/kg | INTRAMUSCULAR | Status: DC | PRN
  Administered 2017-05-22 – 2017-05-25 (×2): 2500 [IU] via INTRAVENOUS_CENTRAL

## 2017-05-22 NOTE — Progress Notes (Signed)
[place] PHYSICAL MEDICINE & REHABILITATION     PROGRESS NOTE  Subjective/Complaints:  Pt seen lying in bed this AM.  He states he slept well overnight.   ROS: Denies CP, SOB, nausea, vomiting, diarrhea.  Objective: Vital Signs: Blood pressure (!) 144/81, pulse 76, temperature 97.6 F (36.4 C), temperature source Oral, resp. rate 15, height 6' (1.829 m), weight 124 kg (273 lb 6.4 oz), SpO2 100 %. No results found. Recent Labs    05/20/17 1230  WBC 27.6*  HGB 8.4*  HCT 25.0*  PLT 404*   Recent Labs    05/20/17 1230  NA 130*  K 3.4*  CL 91*  GLUCOSE 297*  BUN 71*  CREATININE 6.96*  CALCIUM 9.1   CBG (last 3)  Recent Labs    05/21/17 2202 05/22/17 0627 05/22/17 1141  GLUCAP 300* 204* 157*    Wt Readings from Last 3 Encounters:  05/22/17 124 kg (273 lb 6.4 oz)  05/13/17 132.1 kg (291 lb 3.6 oz)  04/02/17 (!) 154.9 kg (341 lb 9.6 oz)    Physical Exam:  BP (!) 144/81 (BP Location: Right Arm)   Pulse 76   Temp 97.6 F (36.4 C) (Oral)   Resp 15   Ht 6' (1.829 m)   Wt 124 kg (273 lb 6.4 oz) Comment: one pillow, pad, sheet, blanket on bed   SpO2 100%   BMI 37.08 kg/m  Constitutional: Obese male. NAD.  HENT: Normocephalic. Atraumatic. Oral: Blisters with ulceration noted on lips and tongue, improving Eyes:EOMare normal. No discharge.  Cardiovascular: RRR. No JVD. Respiratory:Effort normal and breath sounds normal.  GI: Bowel sounds are normal. +Distention, improving.  Musc:  Bouchard's deformities  TTP left hand with edema +Edema b/l LE Neurological: Alert and oriented.  Motor: Right upper extremity 4-/5 proximal to distal (pain inhibition) Left upper extremity: Shoulder abduction, elbow flexion/extension 4-/5, hand grip 2-/5 (pain inhibition) Bilateral lower extremities: 3-/5 proximal to distal (some pain inhibition, gradually improving)  Sensation is intact to light touch bilateral upper and lower limbs Skin. LE ischemic changes.    Assessment/Plan: 1. Functional deficits secondary to debility which require 3+ hours per day of interdisciplinary therapy in a comprehensive inpatient rehab setting. Physiatrist is providing close team supervision and 24 hour management of active medical problems listed below. Physiatrist and rehab team continue to assess barriers to discharge/monitor patient progress toward functional and medical goals.  Function:  Bathing Bathing position   Position: Wheelchair/chair at sink  Bathing parts Body parts bathed by patient: Right arm, Chest, Abdomen, Left upper leg, Right lower leg, Left arm, Left lower leg Body parts bathed by helper: Back  Bathing assist Assist Level: 2 helpers(MOD A; 2 helpers sit to stand for buttock hygiene)      Upper Body Dressing/Undressing Upper body dressing   What is the patient wearing?: Pull over shirt/dress     Pull over shirt/dress - Perfomed by patient: Thread/unthread right sleeve, Thread/unthread left sleeve, Put head through opening Pull over shirt/dress - Perfomed by helper: Pull shirt over trunk        Upper body assist Assist Level: Supervision or verbal cues(per OT note)      Lower Body Dressing/Undressing Lower body dressing Lower body dressing/undressing activity did not occur: N/A What is the patient wearing?: Pants, Underwear   Underwear - Performed by helper: Thread/unthread right underwear leg, Thread/unthread left underwear leg, Pull underwear up/down   Pants- Performed by helper: Thread/unthread right pants leg, Thread/unthread left pants leg  Shoes - Performed by helper: Don/doff right shoe, Don/doff left shoe(slippers)          Lower body assist Assist for lower body dressing: 2 Helpers      Toileting Toileting     Toileting steps completed by helper: Adjust clothing prior to toileting, Performs perineal hygiene, Adjust clothing after toileting    Toileting assist     Transfers Chair/bed transfer  Chair/bed transfer activity did not occur: Refused Chair/bed transfer method: Lateral scoot Chair/bed transfer assist level: 2 helpers Chair/bed transfer assistive device: Armrests, Sliding board Mechanical lift: Stedy   Locomotion Ambulation Ambulation activity did not occur: Refused   Max distance: 10' total in 2 intervals     Wheelchair Wheelchair activity did not occur: Refused Type: Manual Max wheelchair distance: 100' Assist Level: Moderate assistance (Pt 50 - 74%)  Cognition Comprehension Comprehension assist level: Follows complex conversation/direction with no assist  Expression Expression assist level: Expresses complex ideas: With no assist  Social Interaction Social Interaction assist level: Interacts appropriately with others with medication or extra time (anti-anxiety, antidepressant).  Problem Solving Problem solving assist level: Solves complex 90% of the time/cues < 10% of the time  Memory Memory assist level: Complete Independence: No helper    Medical Problem List and Plan: 1.Debilitysecondary to cardiorenal syndrome, synovitis left hand due to RA   Cont CIR    2. DVT Prophylaxis/Anticoagulation: Eliquis. Monitor for any bleeding episodes 3. Pain Management:Neurontin 300 mg daily, Robaxin as needed   Voltaren gel added for joint pain.   Neuropathic pain Left hand - cont gabapentin, started on capsasin cream .025%   Lidoderm patch added on 3/5   Improving 4. Mood:Provide emotional support 5. Neuropsych: This patientiscapable of making decisions on hisown behalf. 6. Skin/Wound Care:Routine skin checks 7. Fluids/Electrolytes/Nutrition:Routine I&O's 8.End-stage renal disease. Hemodialysis initiated Tuesday Thursday Saturday schedule. Patient with left AVFgraft placed 05/10/2017   Appreciate nephro recs. 9.Acute on chronic anemia. Continue Aranesp.    Hb 8.4 on 3/7   Cont to monitor 10.Steroid induced hyperglycemia on Diabetes mellitus with  peripheral neuropathy. Latest hemoglobin A1c 9.2. Diabetic teaching   Lantus insulin 25 units daily at bedtime, increased to 30 on 3/8, will need to decrease with taper of steroids   NovoLog 3 units 3 times a day, increased to 6 on 3/4.    Monitor with increased mobility   Elevated and labile on 3/9 11.Acute on chronic diastolic congestive heart failure. Monitor for any signs of fluid overload. Continue Coreg 6.25mg  BID  Filed Weights   05/20/17 1713 05/21/17 0456 05/22/17 0500  Weight: 121.9 kg (268 lb 11.9 oz) 121.5 kg (267 lb 13.7 oz) 124 kg (273 lb 6.4 oz)     Unreliable 12.Rheumatoid arthritis. Prednisone 10 mg daily. Patient had been receiving Abataceptevery Tuesday prior to admission. Added Voltaren gel. Follow-up rheumatologist Dr.Ziokowska   MRI right knee reviewed, showing OA, meniscal tear and patellar tendinosis on right.  Per Ortho, braces PRN   DMARD resumed on 3/5    Started on high-dose 6 week steroid taper on 3/6  13.Atrial fibrillation RVR. Status post cardioversion. Amiodarone decreased to 200 mg daily 3/3 14.Leukocytosis. Felt to be related to prednisone.    Low-grade fever: Improved   WBCs 30.0 on 3/5   Hand films reviewed, suggesting RA changes   Cont to monitor 15.  Left hand numbness   Likely related to fistula placement  16. Labile blood pressure  Remains labile on 3/9 17. 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 Constipation   Bowel regimen increased on 3/7  LOS (Days) 9 A FACE TO FACE EVALUATION WAS PERFORMED  Ankit Lorie Phenix 05/22/2017 1:12 PM

## 2017-05-22 NOTE — Progress Notes (Signed)
Physical Therapy Session Note  Patient Details  Name: John Bailey MRN: [id number] Date of Birth: 11-Aug-1970  Today's Date: 05/22/2017 PT Individual Time: 1420-1500   40 min   Short Term Goals: Week 2:  PT Short Term Goal 1 (Week 2): Pt will initiate gait training w/ LRAD PT Short Term Goal 2 (Week 2): Pt will transfer sit<>stand max assist x1 using LRAD PT Short Term Goal 3 (Week 2): Pt will transfer bed<>chair w/ max assist x1 using LRAD PT Short Term Goal 4 (Week 2): Pt will demonstrate scooting and adjusting in w/c for pressure relief and comfort w/ mod assist PT Short Term Goal 5 (Week 2): Pt will maintain static stance w/ BUE support on RW w/ min assist for 30 sec at a time  Skilled Therapeutic Interventions/Progress Updates:   Pt received supine in bed and agreeable to PT. Supine>sit transfer with supervision assist and heavy use of rails.    Sitting balance for dressing upper body and lower body. Lateral lean R and L to remove pants. PT required to assist on the L due to grip strength deficits. Additional lateral weight shifts for PT to pull pants to waist. Supervision assist from PT for upper body clothing management with min cues from PT for technique.   Sitting BLE/BUE therex  LAQ 2x 10BLE Hip adduction to squeeze ball x 15 Ankle PF x 30.  Reciprocal marches 2 x 10  Shoulder flexion with 1# bar weight x12  Chest press with 1 #bar weight. x15  Lateral scoot EOB with mod-max assist from PT with cues for improved anterior weight shift and use of RLE x 5  Sit>supine completed at end of session with supervision assist and left supine in bed with call bell in reach and all needs met.       Therapy Documentation Precautions:  Precautions Precautions: Fall Precaution Comments: Severe RA in shoulders, left hand Required Braces or Orthoses: Other Brace/Splint Other Brace/Splint: Bilateral hinged knee braces for comfort and support. Restrictions Weight Bearing  Restrictions: No Pain:   6/10 BLE   See Function Navigator for Current Functional Status.   Therapy/Group: Individual Therapy  Lorie Phenix 05/22/2017, 2:48 PM

## 2017-05-22 NOTE — Progress Notes (Signed)
HD tx initiated via HD cath w/ lines reversed d/t sluggish pull on AP and VP, VSS, will cont to monitor while on HD tx

## 2017-05-23 LAB — GLUCOSE, CAPILLARY
GLUCOSE-CAPILLARY: 172 mg/dL — AB (ref 65–99)
GLUCOSE-CAPILLARY: 190 mg/dL — AB (ref 65–99)
GLUCOSE-CAPILLARY: 356 mg/dL — AB (ref 65–99)
Glucose-Capillary: 198 mg/dL — ABNORMAL HIGH (ref 65–99)
Glucose-Capillary: 270 mg/dL — ABNORMAL HIGH (ref 65–99)

## 2017-05-23 LAB — HEPATITIS B CORE ANTIBODY, TOTAL: Hep B Core Total Ab: NEGATIVE

## 2017-05-23 LAB — HEPATITIS B SURFACE ANTIBODY,QUALITATIVE: HEP B S AB: REACTIVE

## 2017-05-23 LAB — HEPATITIS B SURFACE ANTIGEN: HEP B S AG: NEGATIVE

## 2017-05-23 MED ORDER — NEPRO/CARBSTEADY PO LIQD
237.0000 mL | Freq: Two times a day (BID) | ORAL | Status: DC
  Administered 2017-05-23 – 2017-06-03 (×11): 237 mL via ORAL

## 2017-05-23 MED ORDER — HEPARIN SODIUM (PORCINE) 1000 UNIT/ML DIALYSIS
1000.0000 [IU] | INTRAMUSCULAR | Status: DC | PRN
  Administered 2017-05-22: 1000 [IU] via INTRAVENOUS_CENTRAL

## 2017-05-23 MED ORDER — RENA-VITE PO TABS
1.0000 | ORAL_TABLET | Freq: Every day | ORAL | Status: DC
  Administered 2017-05-23 – 2017-06-03 (×12): 1 via ORAL
  Filled 2017-05-23 (×13): qty 1

## 2017-05-23 MED ORDER — PRO-STAT SUGAR FREE PO LIQD
30.0000 mL | Freq: Two times a day (BID) | ORAL | Status: DC
  Administered 2017-05-24 – 2017-06-03 (×16): 30 mL via ORAL
  Filled 2017-05-23 (×22): qty 30

## 2017-05-23 NOTE — Progress Notes (Signed)
[facility] Rounding Note  Background: PMH systolic and diastolic HF EF 93%, DM2, HLD, RA, known CKD (Dr. Moshe Cipro). Adm 04/2017 massive volume overload. HD initiated for renal failure, fluid.  Had temp cath initially, then [facility] 2/19, L RC AVF 05/10/17. CLIPPED to [place] [facility]. On rehab d/t severe deconditioning.     Assessment/Plan:  1. New ESRD 2/2 DM/HTN. 1st HD inpt 04/28/17. Weight down sig.  Still vol xs. 4L off last couple of TMT's. Appears pt misunderstood outpt schedule. He is in fact TTS at [facility] (but wants MWF). Establishing EDW. Lowest post weight so far 118.6 kg. Just since March 2 when went to rehab weight down another 11 kg. Still has 3+ LE edema. BP tolerating well. Next HD on Tuesday. Have been doing 4.5 hour treatments. EDW not yet established. 2. Anemia Darbe 100 QTuesday with HD. Last dosed 3/5.  ^ d dose to 150 3. HPTH calcitriol QOD - changed to HD TTS. PTH 230 on 05/11/17 4. Malnutrition - renal vit/suppls 5. DM controlled 6. Afib on amio/apixaban 7. Obesity 8. DJD 9. RA - pred taper + topicals 10. Neuropathy 11. Leukocytosis WBC down from 30K mid week to around 19K Saturday. Monitoring. No ATB's.  Jamal Maes, MD [facility] [phone] Pager 05/23/2017, 2:37 PM   Subjective: Appears pt misunderstood his outpt HD schedule Current assignment is TTS [place] (not MWF as he thought - and wants) So current inpt schedule OK  Objective: Vital signs in last 24 hours: Temp:  [97.3 F (36.3 C)-98 F (36.7 C)] 98 F (36.7 C) (03/10 0600) Pulse Rate:  [72-82] 82 (03/10 0600) Resp:  [12-22] 18 (03/10 0600) BP: (124-149)/(73-90) 131/73 (03/10 0600) SpO2:  [96 %-100 %] 100 % (03/10 0600) Weight:  [118.6 kg (261 lb 7.5 oz)-123.6 kg (272 lb 7.8 oz)] 118.6 kg (261 lb 7.5 oz) (03/10 0600) Weight change: -0.413 kg (-14.6 oz)  Pastor in the room with him Great spirits NAD  Ant clear lung fields S1S2 No S3 + 2/6 murmur LSB Chronic skin changes LE's 2+  pitting edema Legs dyesethetic Compression glove on left hand L AVF + bruit (2/25) R IJ TDC (2/19) dry dressing in place   Recent Labs    05/22/17 1822  NA 129*  K 3.9  CL 92*  CO2 21*  GLUCOSE 261*  BUN 89*  CREATININE 6.29*  CALCIUM 8.5*  PHOS 6.1*   Recent Labs    05/22/17 1822  ALT 12*  ALBUMIN 2.4*   Recent Labs    05/22/17 1822  WBC 18.9*  HGB 9.0*  HCT 27.0*  MCV 80.8  PLT 549*   Medications . Abatacept  125 mg Subcutaneous Q Tue  . acyclovir  200 mg Oral BID  . amiodarone  200 mg Oral Daily  . apixaban  5 mg Oral BID  . calcitRIOL  0.5 mcg Oral Q T,Th,Sa-HD  . capsaicin   Topical BID  . [START ON 05/25/2017] darbepoetin (ARANESP) injection - DIALYSIS  150 mcg Intravenous Q Tue-HD  . fluticasone  2 spray Each Nare Daily  . gabapentin  300 mg Oral Daily  . Gerhardt's butt cream   Topical BID  . insulin aspart  0-15 Units Subcutaneous TID WC  . insulin aspart  6 Units Subcutaneous TID WC  . insulin glargine  30 Units Subcutaneous QHS  . lidocaine  3 patch Transdermal Q24H  . multivitamin  1 tablet Oral QHS  . predniSONE  60 mg Oral Q breakfast  Followed by  . [START ON 05/26/2017] predniSONE  50 mg Oral Q breakfast   Followed by  . [START ON 06/02/2017] predniSONE  40 mg Oral Q breakfast   Followed by  . [START ON 06/09/2017] predniSONE  30 mg Oral Q breakfast   Followed by  . [START ON 06/16/2017] predniSONE  20 mg Oral Q breakfast   Followed by  . [START ON 06/23/2017] predniSONE  10 mg Oral Q breakfast  . senna-docusate  2 tablet Oral QHS

## 2017-05-23 NOTE — Progress Notes (Signed)
[place] PHYSICAL MEDICINE & REHABILITATION     PROGRESS NOTE  Subjective/Complaints:  Patient seen lying in bed this morning.  He states he did not sleep well overnight due to the timing of his hemodialysis session.  He states that it was very late and he could not sleep at all after that.  He also would like to know the length of his hemodialysis sessions as an outpatient.  ROS: Denies CP, SOB, nausea, vomiting, diarrhea.  Objective: Vital Signs: Blood pressure 131/73, pulse 82, temperature 98 F (36.7 C), temperature source Oral, resp. rate 18, height 6' (1.829 m), weight 118.6 kg (261 lb 7.5 oz), SpO2 100 %. No results found. Recent Labs    05/22/17 1822  WBC 18.9*  HGB 9.0*  HCT 27.0*  PLT 549*   Recent Labs    05/22/17 1822  NA 129*  K 3.9  CL 92*  GLUCOSE 261*  BUN 89*  CREATININE 6.29*  CALCIUM 8.5*   CBG (last 3)  Recent Labs    05/23/17 0051 05/23/17 0633 05/23/17 1156  GLUCAP 172* 198* 190*    Wt Readings from Last 3 Encounters:  05/23/17 118.6 kg (261 lb 7.5 oz)  05/13/17 132.1 kg (291 lb 3.6 oz)  04/02/17 (!) 154.9 kg (341 lb 9.6 oz)    Physical Exam:  BP 131/73 (BP Location: Right Arm)   Pulse 82   Temp 98 F (36.7 C) (Oral)   Resp 18   Ht 6' (1.829 m)   Wt 118.6 kg (261 lb 7.5 oz)   SpO2 100%   BMI 35.46 kg/m  Constitutional: Obese male. NAD.  HENT: Normocephalic. Atraumatic. Oral: Blisters with ulceration noted on lips and tongue, improving Eyes:EOMare normal. No discharge.  Cardiovascular: RRR. No JVD. Respiratory:Effort normal and breath sounds normal.  GI: Bowel sounds are normal. +Distention, improving.  Musc:  Bouchard's deformities  TTP left hand with edema +Edema b/l LE Neurological: Alert and oriented.  Motor: Right upper extremity 4-/5 proximal to distal (pain inhibition) Left upper extremity: Shoulder abduction, elbow flexion/extension 4-/5, hand grip 2-/5 (pain inhibition, stable) Bilateral lower extremities: 3-/5  proximal to distal (some pain inhibition, gradually improving)  Sensation is intact to light touch bilateral upper and lower limbs Skin. LE ischemic changes.   Assessment/Plan: 1. Functional deficits secondary to debility which require 3+ hours per day of interdisciplinary therapy in a comprehensive inpatient rehab setting. Physiatrist is providing close team supervision and 24 hour management of active medical problems listed below. Physiatrist and rehab team continue to assess barriers to discharge/monitor patient progress toward functional and medical goals.  Function:  Bathing Bathing position   Position: Wheelchair/chair at sink  Bathing parts Body parts bathed by patient: Right arm, Chest, Abdomen, Left upper leg, Right lower leg, Left arm, Left lower leg Body parts bathed by helper: Back  Bathing assist Assist Level: 2 helpers(MOD A; 2 helpers sit to stand for buttock hygiene)      Upper Body Dressing/Undressing Upper body dressing   What is the patient wearing?: Pull over shirt/dress     Pull over shirt/dress - Perfomed by patient: Thread/unthread right sleeve, Thread/unthread left sleeve, Put head through opening Pull over shirt/dress - Perfomed by helper: Pull shirt over trunk        Upper body assist Assist Level: Supervision or verbal cues(per OT note)      Lower Body Dressing/Undressing Lower body dressing Lower body dressing/undressing activity did not occur: N/A What is the patient wearing?: Pants, Underwear  Underwear - Performed by helper: Thread/unthread right underwear leg, Thread/unthread left underwear leg, Pull underwear up/down   Pants- Performed by helper: Thread/unthread right pants leg, Thread/unthread left pants leg           Shoes - Performed by helper: Don/doff right shoe, Don/doff left shoe(slippers)          Lower body assist Assist for lower body dressing: 2 Helpers      Toileting Toileting     Toileting steps completed by  helper: Adjust clothing prior to toileting, Performs perineal hygiene, Adjust clothing after toileting    Toileting assist     Transfers Chair/bed transfer Chair/bed transfer activity did not occur: Refused Chair/bed transfer method: Lateral scoot Chair/bed transfer assist level: 2 helpers Chair/bed transfer assistive device: Armrests, Sliding board Mechanical lift: Stedy   Locomotion Ambulation Ambulation activity did not occur: Refused   Max distance: 10' total in 2 intervals     Wheelchair Wheelchair activity did not occur: Refused Type: Manual Max wheelchair distance: 100' Assist Level: Moderate assistance (Pt 50 - 74%)  Cognition Comprehension Comprehension assist level: Follows complex conversation/direction with no assist  Expression Expression assist level: Expresses complex ideas: With no assist  Social Interaction Social Interaction assist level: Interacts appropriately with others with medication or extra time (anti-anxiety, antidepressant).  Problem Solving Problem solving assist level: Solves complex 90% of the time/cues < 10% of the time  Memory Memory assist level: Complete Independence: No helper    Medical Problem List and Plan: 1.Debilitysecondary to cardiorenal syndrome, synovitis left hand due to RA   Cont CIR    2. DVT Prophylaxis/Anticoagulation: Eliquis. Monitor for any bleeding episodes 3. Pain Management:Neurontin 300 mg daily, Robaxin as needed   Voltaren gel added for joint pain.   Neuropathic pain Left hand - cont gabapentin, started on capsasin cream .025%   Lidoderm patch added on 3/5   Improving 4. Mood:Provide emotional support 5. Neuropsych: This patientiscapable of making decisions on hisown behalf. 6. Skin/Wound Care:Routine skin checks 7. Fluids/Electrolytes/Nutrition:Routine I&O's 8.End-stage renal disease. Hemodialysis initiated Tuesday Thursday Saturday schedule. Patient with left AVFgraft placed 05/10/2017   Appreciate  nephro recs. 9.Acute on chronic anemia. Continue Aranesp.    Hb 9.0 on 3/9   Cont to monitor 10.Steroid induced hyperglycemia on Diabetes mellitus with peripheral neuropathy. Latest hemoglobin A1c 9.2. Diabetic teaching   Lantus insulin 25 units daily at bedtime, increased to 30 on 3/8, will need to decrease with taper of steroids   NovoLog 3 units 3 times a day, increased to 6 on 3/4.    Monitor with increased mobility   Elevated and stabilizing on 3/10 11.Acute on chronic diastolic congestive heart failure. Monitor for any signs of fluid overload. Continue Coreg 6.25mg  BID  Filed Weights   05/22/17 2357 05/23/17 0500 05/23/17 0600  Weight: 119.6 kg (263 lb 10.7 oz) 121.5 kg (267 lb 13.7 oz) 118.6 kg (261 lb 7.5 oz)     Unreliable 12.Rheumatoid arthritis. Prednisone 10 mg daily. Patient had been receiving Abataceptevery Tuesday prior to admission. Added Voltaren gel. Follow-up rheumatologist Dr.Ziokowska   MRI right knee reviewed, showing OA, meniscal tear and patellar tendinosis on right.  Per Ortho, braces PRN   DMARD resumed on 3/5    Started on high-dose 6 week steroid taper on 3/6  13.Atrial fibrillation RVR. Status post cardioversion. Amiodarone decreased to 200 mg daily 3/3 14.Leukocytosis. Felt to be related to prednisone.    Low-grade fever: Resolved   WBCs 18.9 on 3/9  Hand films reviewed, suggesting RA changes   Cont to monitor 15.  Left hand numbness   Likely related to fistula placement  16. Labile blood pressure  Controlled on 3/10 17. 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 Constipation   Bowel regimen increased on 3/7  LOS (Days) 10 A FACE TO FACE EVALUATION WAS PERFORMED  Ankit Lorie Phenix 05/23/2017 2:12 PM

## 2017-05-23 NOTE — Progress Notes (Signed)
HD tx completed w/o problem, UF goal met, blood rinsed back, report called to [employer], RN

## 2017-05-24 ENCOUNTER — Inpatient Hospital Stay (HOSPITAL_COMMUNITY): Payer: BLUE CROSS/BLUE SHIELD | Admitting: Occupational Therapy

## 2017-05-24 ENCOUNTER — Inpatient Hospital Stay (HOSPITAL_COMMUNITY): Payer: BLUE CROSS/BLUE SHIELD

## 2017-05-24 LAB — GLUCOSE, CAPILLARY
GLUCOSE-CAPILLARY: 145 mg/dL — AB (ref 65–99)
Glucose-Capillary: 319 mg/dL — ABNORMAL HIGH (ref 65–99)
Glucose-Capillary: 271 mg/dL — ABNORMAL HIGH (ref 65–99)
Glucose-Capillary: 266 mg/dL — ABNORMAL HIGH (ref 65–99)

## 2017-05-24 NOTE — Progress Notes (Signed)
[facility] Rounding Note  Background: PMH systolic and diastolic HF EF 62%, DM2, HLD, RA, known CKD (Dr. Moshe Cipro). Adm 04/2017 massive volume overload. HD initiated for renal failure, fluid.  Had temp cath initially, then [facility] 2/19, L RC AVF 05/10/17. CLIPPED to [place] [facility]. On rehab d/t severe deconditioning.     Assessment/Plan:  1. New ESRD 2/2 DM/HTN. 1st HD inpt 04/28/17. Weight down sig.  Still vol xs. 4L off last couple of TMT's. Appears pt misunderstood outpt schedule. He is in fact TTS at [facility] (but wants MWF). Establishing EDW. Lowest post weight so far 118.6 kg. Just since March 2 when went to rehab weight down another 11 kg. Still has 3+ LE edema. BP tolerating well. Next HD on Tuesday. Have been doing 4.5 hour treatments. EDW not yet established. 2. Anemia Darbe 100 QTuesday with HD. Last dosed 3/5.  ^ d dose to 150 3. HPTH calcitriol QOD - changed to HD TTS. PTH 230 on 05/11/17 4. Malnutrition - renal vit/suppls 5. DM controlled 6. Afib on amio/apixaban 7. Obesity 8. DJD 9. RA - pred taper + topicals 10. Neuropathy 11. Leukocytosis WBC down from 30K mid week to around 19K Saturday. Monitoring. No ATB's.  John Bailey A 05/23/2017, 2:37 PM   Subjective:  Current assignment is TTS [place] (not MWF as he thought - and wants)- now is saying will go to [place] on MWF ?? Will clarify prior to discharge  So current inpt schedule OK and conducive to more rehab   Objective: Vital signs in last 24 hours: Temp:  [97.4 F (36.3 C)-98.4 F (36.9 C)] 97.4 F (36.3 C) (03/11 1356) Pulse Rate:  [74-94] 74 (03/11 1356) Resp:  [16-18] 16 (03/11 1356) BP: (138-161)/(76-83) 138/83 (03/11 1356) SpO2:  [98 %-100 %] 98 % (03/11 1356) Weight:  [119 kg (262 lb 5.6 oz)] 119 kg (262 lb 5.6 oz) (03/11 0114) Weight change: -4.6 kg (-2.3 oz)  Pastor in the room with him [place] spirits NAD  Ant clear lung fields S1S2 No S3 + 2/6 murmur LSB Chronic skin changes LE's 2+ pitting  edema Legs dyesethetic Compression glove on left hand L AVF + bruit (2/25) R IJ TDC (2/19) dry dressing in place   Recent Labs    05/22/17 1822  NA 129*  K 3.9  CL 92*  CO2 21*  GLUCOSE 261*  BUN 89*  CREATININE 6.29*  CALCIUM 8.5*  PHOS 6.1*   Recent Labs    05/22/17 1822  ALT 12*  ALBUMIN 2.4*   Recent Labs    05/22/17 1822  WBC 18.9*  HGB 9.0*  HCT 27.0*  MCV 80.8  PLT 549*   Medications . Abatacept  125 mg Subcutaneous Q Tue  . acyclovir  200 mg Oral BID  . amiodarone  200 mg Oral Daily  . apixaban  5 mg Oral BID  . calcitRIOL  0.5 mcg Oral Q T,Th,Sa-HD  . capsaicin   Topical BID  . [START ON 05/25/2017] darbepoetin (ARANESP) injection - DIALYSIS  150 mcg Intravenous Q Tue-HD  . feeding supplement (NEPRO CARB STEADY)  237 mL Oral BID BM  . feeding supplement (PRO-STAT SUGAR FREE 64)  30 mL Oral BID  . fluticasone  2 spray Each Nare Daily  . gabapentin  300 mg Oral Daily  . Gerhardt's butt cream   Topical BID  . insulin aspart  0-15 Units Subcutaneous TID WC  . insulin aspart  6 Units Subcutaneous TID WC  . insulin glargine  30 Units Subcutaneous QHS  . lidocaine  3 patch Transdermal Q24H  . multivitamin  1 tablet Oral QHS  . predniSONE  60 mg Oral Q breakfast   Followed by  . [START ON 05/26/2017] predniSONE  50 mg Oral Q breakfast   Followed by  . [START ON 06/02/2017] predniSONE  40 mg Oral Q breakfast   Followed by  . [START ON 06/09/2017] predniSONE  30 mg Oral Q breakfast   Followed by  . [START ON 06/16/2017] predniSONE  20 mg Oral Q breakfast   Followed by  . [START ON 06/23/2017] predniSONE  10 mg Oral Q breakfast  . senna-docusate  2 tablet Oral QHS

## 2017-05-24 NOTE — Progress Notes (Signed)
[facility] PHYSICAL MEDICINE & REHABILITATION     PROGRESS NOTE  Subjective/Complaints:  Patient seen lying in bed this morning. He states he slept well overnight. He states he feels stronger.  ROS: Denies CP, SOB, nausea, vomiting, diarrhea.  Objective: Vital Signs: Blood pressure 138/79, pulse 81, temperature 98.4 F (36.9 C), temperature source Axillary, resp. rate 18, height 6' (1.829 m), weight 119 kg (262 lb 5.6 oz), SpO2 99 %. No results found. Recent Labs    05/22/17 1822  WBC 18.9*  HGB 9.0*  HCT 27.0*  PLT 549*   Recent Labs    05/22/17 1822  NA 129*  K 3.9  CL 92*  GLUCOSE 261*  BUN 89*  CREATININE 6.29*  CALCIUM 8.5*   CBG (last 3)  Recent Labs    05/23/17 1637 05/23/17 2156 05/24/17 0629  GLUCAP 270* 356* 319*    Wt Readings from Last 3 Encounters:  05/24/17 119 kg (262 lb 5.6 oz)  05/13/17 132.1 kg (291 lb 3.6 oz)  04/02/17 (!) 154.9 kg (341 lb 9.6 oz)    Physical Exam:  BP 138/79 (BP Location: Right Arm)   Pulse 81   Temp 98.4 F (36.9 C) (Axillary)   Resp 18   Ht 6' (1.829 m)   Wt 119 kg (262 lb 5.6 oz)   SpO2 99%   BMI 35.58 kg/m  Constitutional: Obese male. NAD.  HENT: Normocephalic. Atraumatic. Oral: Blisters with ulceration noted on lips and tongue, continues to improve Eyes:EOMare normal. No discharge.  Cardiovascular: RRR. No JVD. Respiratory:Effort normal and breath sounds normal.  GI: Bowel sounds are normal. Nondistended.  Musc:  Bouchard's deformities  TTP left hand with edema +Edema b/l LE Neurological: Alert and oriented.  Motor: Right upper extremity 4-/5 proximal to distal (pain inhibition) Left upper extremity: Shoulder abduction, elbow flexion/extension 4-/5, hand grip 2-/5 (pain inhibition, stable) Bilateral lower extremities: 3+/5 proximal to distal (some pain inhibition, gradually improving)  Sensation is intact to light touch bilateral upper and lower limbs Skin. LE ischemic changes.    Assessment/Plan: 1. Functional deficits secondary to debility which require 3+ hours per day of interdisciplinary therapy in a comprehensive inpatient rehab setting. Physiatrist is providing close team supervision and 24 hour management of active medical problems listed below. Physiatrist and rehab team continue to assess barriers to discharge/monitor patient progress toward functional and medical goals.  Function:  Bathing Bathing position   Position: Wheelchair/chair at sink  Bathing parts Body parts bathed by patient: Right arm, Chest, Abdomen, Left upper leg, Right lower leg, Left arm, Left lower leg Body parts bathed by helper: Back  Bathing assist Assist Level: 2 helpers(MOD A; 2 helpers sit to stand for buttock hygiene)      Upper Body Dressing/Undressing Upper body dressing   What is the patient wearing?: Pull over shirt/dress     Pull over shirt/dress - Perfomed by patient: Thread/unthread right sleeve, Thread/unthread left sleeve, Put head through opening Pull over shirt/dress - Perfomed by helper: Pull shirt over trunk        Upper body assist Assist Level: Supervision or verbal cues(per OT note)      Lower Body Dressing/Undressing Lower body dressing Lower body dressing/undressing activity did not occur: N/A What is the patient wearing?: Pants, Underwear   Underwear - Performed by helper: Pull underwear up/down   Pants- Performed by helper: Pull pants up/down           Shoes - Performed by helper: Don/doff right shoe, Don/doff left  shoe(slippers)          Lower body assist Assist for lower body dressing: 2 Helpers      Toileting Toileting     Toileting steps completed by helper: Adjust clothing prior to toileting, Performs perineal hygiene, Adjust clothing after toileting    Toileting assist Assist level: More than reasonable time   Transfers Chair/bed transfer Chair/bed transfer activity did not occur: Refused Chair/bed transfer method:  Lateral scoot Chair/bed transfer assist level: 2 helpers Chair/bed transfer assistive device: Armrests, Sliding board Mechanical lift: Stedy   Locomotion Ambulation Ambulation activity did not occur: Refused   Max distance: 10' total in 2 intervals     Wheelchair Wheelchair activity did not occur: Refused Type: Manual Max wheelchair distance: 100' Assist Level: Moderate assistance (Pt 50 - 74%)  Cognition Comprehension Comprehension assist level: Follows complex conversation/direction with no assist  Expression Expression assist level: Expresses complex ideas: With no assist  Social Interaction Social Interaction assist level: Interacts appropriately with others with medication or extra time (anti-anxiety, antidepressant).  Problem Solving Problem solving assist level: Solves complex 90% of the time/cues < 10% of the time  Memory Memory assist level: Complete Independence: No helper    Medical Problem List and Plan: 1.Debilitysecondary to cardiorenal syndrome, synovitis left hand due to RA   Cont CIR    2. DVT Prophylaxis/Anticoagulation: Eliquis. Monitor for any bleeding episodes 3. Pain Management:Neurontin 300 mg daily, Robaxin as needed   Voltaren gel added for joint pain.   Neuropathic pain Left hand - cont gabapentin, started on capsasin cream .025%   Lidoderm patch added on 3/5   Improving 4. Mood:Provide emotional support 5. Neuropsych: This patientiscapable of making decisions on hisown behalf. 6. Skin/Wound Care:Routine skin checks 7. Fluids/Electrolytes/Nutrition:Routine I&O's 8.End-stage renal disease. Hemodialysis initiated Tuesday Thursday Saturday schedule. Patient with left AVFgraft placed 05/10/2017   Appreciate nephro recs. 9.Acute on chronic anemia. Continue Aranesp.    Hb 9.0 on 3/9   Cont to monitor 10.Steroid induced hyperglycemia on Diabetes mellitus with peripheral neuropathy. Latest hemoglobin A1c 9.2. Diabetic teaching   Lantus insulin  25 units daily at bedtime, increased to 30 on 3/8, will need to decrease with taper of steroids   NovoLog 3 units 3 times a day, increased to 6 on 3/4.    Monitor with increased mobility   Elevated on 3/11 11.Acute on chronic diastolic congestive heart failure. Monitor for any signs of fluid overload. Continue Coreg 6.25mg  BID  Filed Weights   05/23/17 0500 05/23/17 0600 05/24/17 0114  Weight: 121.5 kg (267 lb 13.7 oz) 118.6 kg (261 lb 7.5 oz) 119 kg (262 lb 5.6 oz)     Unreliable  12.Rheumatoid arthritis. Prednisone 10 mg daily. Patient had been receiving Abataceptevery Tuesday prior to admission. Added Voltaren gel. Follow-up rheumatologist Dr.Ziokowska   MRI right knee reviewed, showing OA, meniscal tear and patellar tendinosis on right.  Per Ortho, braces PRN   DMARD resumed on 3/5    Started on high-dose 6 week steroid taper on 3/6  13.Atrial fibrillation RVR. Status post cardioversion. Amiodarone decreased to 200 mg daily 3/3 14.Leukocytosis.     Low-grade fever: Resolved   WBCs 18.9 on 3/9   Hand films reviewed, suggesting RA changes   Continue to monitor   Cont to monitor 15.  Left hand numbness   Likely related to fistula placement  16. Labile blood pressure  Controlled on 3/11 17. 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 Constipation  Bowel regimen increased on 3/7  LOS (Days) 11 A FACE TO FACE EVALUATION WAS PERFORMED  Ankit Lorie Phenix 05/24/2017 8:30 AM

## 2017-05-24 NOTE — Progress Notes (Signed)
Occupational Therapy Session Note  Patient Details  Name: John Bailey MRN: [id number] Date of Birth: 1970-06-30  Today's Date: 05/24/2017 OT Individual Time: 1740-8144 OT Individual Time Calculation (min): 46 min    Short Term Goals: Week 2:  OT Short Term Goal 1 (Week 2): Pt will complete sit to stand with max assist in preparation for LB selfcare tasks.  OT Short Term Goal 2 (Week 2): Pt will complete sliding board transfer with max assist from bed to wheelchair or drop arm commode. OT Short Term Goal 3 (Week 2): Pt will donn pullup shorts with total assist +2 (pt 40%) sit to stand.   OT Short Term Goal 4 (Week 2): Pt will complete LB bathing with total assist +2 (pt 40%) sit to stand.    Skilled Therapeutic Interventions/Progress Updates:    Pt completed toilet transfer and toileting during session.  He needed max assist for sit to stand from the EOB but then could ambulate to the toilet with min assist using the RW with platform on the left side.  He was able to manage his clothing with min assist but needed total assist for peri hygiene and for pulling pants back over the hips.  He needed max assist for sit to stand from the elevated 3:1 as well.  He was able to ambulate over to the sink and standing with min assist for washing hands.  Finished session with sit to stand transition from the wheelchair X 1 with max assist.  Pt left in wheelchair with call button and phone in reach.    Therapy Documentation Precautions:  Precautions Precautions: Fall Precaution Comments: Severe RA in shoulders, left hand Required Braces or Orthoses: Other Brace/Splint Other Brace/Splint: Bilateral hinged knee braces for comfort and support. Restrictions Weight Bearing Restrictions: No  Pain: Pain Assessment Pain Assessment: Faces Faces Pain Scale: Hurts a little bit Pain Type: Chronic pain Pain Location: Knee Pain Orientation: Left;Right Pain Descriptors / Indicators: Aching;Discomfort Pain  Onset: On-going Pain Intervention(s): Repositioned ADL: See Function Navigator for Current Functional Status.   Therapy/Group: Individual Therapy  Darianny Momon OTR/L 05/24/2017, 4:20 PM

## 2017-05-24 NOTE — Progress Notes (Signed)
Occupational Therapy Session Note  Patient Details  Name: John Bailey MRN: [id number] Date of Birth: 1970/09/26  Today's Date: 05/24/2017 OT Individual Time: 3428-7681 OT Individual Time Calculation (min): 54 min    Short Term Goals: Week 2:  OT Short Term Goal 1 (Week 2): Pt will complete sit to stand with max assist in preparation for LB selfcare tasks.  OT Short Term Goal 2 (Week 2): Pt will complete sliding board transfer with max assist from bed to wheelchair or drop arm commode. OT Short Term Goal 3 (Week 2): Pt will donn pullup shorts with total assist +2 (pt 40%) sit to stand.   OT Short Term Goal 4 (Week 2): Pt will complete LB bathing with total assist +2 (pt 40%) sit to stand.    Skilled Therapeutic Interventions/Progress Updates:    Pt completed washing front peri area in supine with setup.  He then needed max assist for washing peri area in sidelying.  Once this was finished he transitioned to sitting from supine with min assist and HOB elevated 20 degrees.  Next had pt work on bathing.  He washed all other body parts with setup only including his feet, for which he utilized a LH sponge for.  He also used the sponge for washing the right shoulder.  He utilized the reacher for donning underpants over feet and for donning shoes, but did not need it for donning shorts over his LEs.  He completed sit to stand from the elevated EOB with total assist +2 (pt 50%).  He then complete stand pivot transfer to the wheelchair from lowered bed with total assist +2 (pt 30%).  Grooming tasks completed with setup only from wheelchair level.  Pt left with nursing at end of session in wheelchair at bedside.    Therapy Documentation Precautions:  Precautions Precautions: Fall Precaution Comments: Severe RA in shoulders, left hand Required Braces or Orthoses: Other Brace/Splint Other Brace/Splint: Bilateral hinged knee braces for comfort and support. Restrictions Weight Bearing Restrictions:  No   Pain: Pain Assessment Pain Assessment: Faces Faces Pain Scale: Hurts little more Pain Type: Chronic pain Pain Location: Knee Pain Orientation: Left;Right Pain Descriptors / Indicators: Aching Pain Onset: On-going Pain Intervention(s): Repositioned;Emotional support ADL: See Function Navigator for Current Functional Status.   Therapy/Group: Individual Therapy  Oni Dietzman OTR/L 05/24/2017, 12:28 PM

## 2017-05-24 NOTE — Progress Notes (Signed)
Physical Therapy Session Note  Patient Details  Name: John Bailey MRN: [id number] Date of Birth: [birth year]  Today's Date: 05/24/2017 PT Individual Time: 1001-1100,1516-1558 PT Individual Time Calculation (min): 59 min and 42 min  Short Term Goals: Week 2:  PT Short Term Goal 1 (Week 2): Pt will initiate gait training w/ LRAD PT Short Term Goal 2 (Week 2): Pt will transfer sit<>stand max assist x1 using LRAD PT Short Term Goal 3 (Week 2): Pt will transfer bed<>chair w/ max assist x1 using LRAD PT Short Term Goal 4 (Week 2): Pt will demonstrate scooting and adjusting in w/c for pressure relief and comfort w/ mod assist PT Short Term Goal 5 (Week 2): Pt will maintain static stance w/ BUE support on RW w/ min assist for 30 sec at a time  Skilled Therapeutic Interventions/Progress Updates:    Session 1: Pt seated in w/c upon PT arrival, agreeable to therapy tx and reports pain 3/10. Pt transported to gym in w/c. Pt performed x 2 sit<>stands from w/c height for ambulation with max assist+2 using RW, pt ambulated x 41 ft and x 42 ft with L platform RW and mod assist, verbal cues for techniques and foot placement. Pt performed seated therex in w/c for strengthening 2 x 10 each: LAQ, hip flexion, ankle pumps, hip abduction with theraband, hamstring curls with theraband, ball squeezes. Pt transported back to room and performed sit<>stand with max assist and RW, stand pivot with RW and min assist. Pt transferred to supine with supervision, left supine in bed with needs in reach.  Session 2:  Pt seated in w/c upon PT arrival, agreeable to therapy tx and reports pain 4/10 in B feet/R ankle. Pt transported from room>gym total assist. Pt used kinetron x 6 minutes for LE strengthening and reciprocal LE movement, 40 cm/sec. Pt performed seated therex in w/c for strengthening 2 x 10 each: LAQ, hip flexion, ankle pumps, hip abduction with theraband. Pt worked on w/c propulsion using B LEs to go forwards and  backwards x 20 ft each direction. Pt propelled w/c x 100 ft with B LEs and R UE with supervision. Pt left seated in w/c at end of session with needs in reach.   Therapy Documentation Precautions:  Precautions Precautions: Fall Precaution Comments: Severe RA in shoulders, left hand Required Braces or Orthoses: Other Brace/Splint Other Brace/Splint: Bilateral hinged knee braces for comfort and support. Restrictions Weight Bearing Restrictions: No   See Function Navigator for Current Functional Status.   Therapy/Group: Individual Therapy  Netta Corrigan, PT, DPT 05/24/2017, 7:41 AM

## 2017-05-25 ENCOUNTER — Inpatient Hospital Stay (HOSPITAL_COMMUNITY): Payer: BLUE CROSS/BLUE SHIELD | Admitting: Occupational Therapy

## 2017-05-25 ENCOUNTER — Inpatient Hospital Stay (HOSPITAL_COMMUNITY): Payer: BLUE CROSS/BLUE SHIELD

## 2017-05-25 LAB — RENAL FUNCTION PANEL
ALBUMIN: 2.7 g/dL — AB (ref 3.5–5.0)
Anion gap: 18 — ABNORMAL HIGH (ref 5–15)
BUN: 125 mg/dL — ABNORMAL HIGH (ref 6–20)
CALCIUM: 8.7 mg/dL — AB (ref 8.9–10.3)
CO2: 19 mmol/L — ABNORMAL LOW (ref 22–32)
Chloride: 96 mmol/L — ABNORMAL LOW (ref 101–111)
Creatinine, Ser: 6.87 mg/dL — ABNORMAL HIGH (ref 0.61–1.24)
GFR, EST AFRICAN AMERICAN: 10 mL/min — AB (ref >60)
GFR, EST NON AFRICAN AMERICAN: 9 mL/min — AB (ref >60)
GLUCOSE: 171 mg/dL — AB (ref 65–99)
PHOSPHORUS: 6.4 mg/dL — AB (ref 2.5–4.6)
Potassium: 3.6 mmol/L (ref 3.5–5.1)
SODIUM: 133 mmol/L — AB (ref 135–145)

## 2017-05-25 LAB — CBC
HCT: 28 % — ABNORMAL LOW (ref 39.0–52.0)
Hemoglobin: 8.9 g/dL — ABNORMAL LOW (ref 13.0–17.0)
MCH: 26.6 pg (ref 26.0–34.0)
MCHC: 31.8 g/dL (ref 30.0–36.0)
MCV: 83.6 fL (ref 78.0–100.0)
Platelets: 630 10*3/uL — ABNORMAL HIGH (ref 150–400)
RBC: 3.35 MIL/uL — AB (ref 4.22–5.81)
RDW: 21.5 % — ABNORMAL HIGH (ref 11.5–15.5)
WBC: 23.7 10*3/uL — AB (ref 4.0–10.5)

## 2017-05-25 LAB — GLUCOSE, CAPILLARY
GLUCOSE-CAPILLARY: 248 mg/dL — AB (ref 65–99)
GLUCOSE-CAPILLARY: 165 mg/dL — AB (ref 65–99)
Glucose-Capillary: 169 mg/dL — ABNORMAL HIGH (ref 65–99)

## 2017-05-25 MED ORDER — ALTEPLASE 2 MG IJ SOLR
INTRAMUSCULAR | Status: AC
  Administered 2017-05-25: 2 mg
  Filled 2017-05-25: qty 4

## 2017-05-25 MED ORDER — DARBEPOETIN ALFA 150 MCG/0.3ML IJ SOSY
PREFILLED_SYRINGE | INTRAMUSCULAR | Status: AC
  Filled 2017-05-25: qty 0.3

## 2017-05-25 MED ORDER — CALCIUM ACETATE (PHOS BINDER) 667 MG PO CAPS
667.0000 mg | ORAL_CAPSULE | Freq: Three times a day (TID) | ORAL | Status: DC
  Administered 2017-05-25 – 2017-05-26 (×3): 667 mg via ORAL
  Filled 2017-05-25 (×5): qty 1

## 2017-05-25 MED ORDER — HEPARIN SODIUM (PORCINE) 1000 UNIT/ML DIALYSIS: 20.0000 [IU]/kg | INTRAMUSCULAR | Status: DC | PRN

## 2017-05-25 MED ORDER — ALTEPLASE 2 MG IJ SOLR
2.0000 mg | Freq: Once | INTRAMUSCULAR | Status: AC
  Administered 2017-05-25 (×2): 2 mg

## 2017-05-25 NOTE — Procedures (Signed)
Patient was seen on dialysis and the procedure was supervised.  BFR 250  Via PC BP is  141/82.   Patient appears to be tolerating treatment well  John Bailey A 05/25/2017

## 2017-05-25 NOTE — Progress Notes (Signed)
Occupational Therapy Session Note  Patient Details  Name: JUNIE ENGRAM MRN: [id number] Date of Birth: 1970/04/19  Today's Date: 05/25/2017 OT Individual Time: 1000-1100 OT Individual Time Calculation (min): 60 min    Short Term Goals: Week 2:  OT Short Term Goal 1 (Week 2): Pt will complete sit to stand with max assist in preparation for LB selfcare tasks.  OT Short Term Goal 2 (Week 2): Pt will complete sliding board transfer with max assist from bed to wheelchair or drop arm commode. OT Short Term Goal 3 (Week 2): Pt will donn pullup shorts with total assist +2 (pt 40%) sit to stand.   OT Short Term Goal 4 (Week 2): Pt will complete LB bathing with total assist +2 (pt 40%) sit to stand.    Skilled Therapeutic Interventions/Progress Updates:    Pt completed toilet transfer from wheelchair at bedside into the bathroom with mod assist.  He needed max assist for toilet hygiene and clothing management.  He was able to then transfer out to the wheelchair at the sink with mod assist as well.  Bathing completed with overall mod assist.  Use of the LH sponge to wash the right under arm and shoulder.  He did not attempt washing his feet this session.  He donned LB clothing as well with mod assist sit to stand.  Finished session with pt in wheelchair after completion of grooming tasks with PT in for next session.    Therapy Documentation Precautions:  Precautions Precautions: Fall Precaution Comments: Severe RA in shoulders, left hand Required Braces or Orthoses: Other Brace/Splint Other Brace/Splint: Bilateral hinged knee braces for comfort and support. Restrictions Weight Bearing Restrictions: No  Pain: 5/10 in shoulders, feet, and left hand With activity Repositioned for pain See Function Navigator for Current Functional Status.   Therapy/Group: Individual Therapy  Kaeden Depaz OTR/L 05/25/2017, 12:24 PM

## 2017-05-25 NOTE — Progress Notes (Signed)
Physical Therapy Session Note  Patient Details  Name: John Bailey MRN: [id number] Date of Birth: 06/09/1970  Today's Date: 05/25/2017 PT Individual Time: 3888-2800 PT Individual Time Calculation (min): 73 min   Short Term Goals: Week 2:  PT Short Term Goal 1 (Week 2): Pt will initiate gait training w/ LRAD PT Short Term Goal 2 (Week 2): Pt will transfer sit<>stand max assist x1 using LRAD PT Short Term Goal 3 (Week 2): Pt will transfer bed<>chair w/ max assist x1 using LRAD PT Short Term Goal 4 (Week 2): Pt will demonstrate scooting and adjusting in w/c for pressure relief and comfort w/ mod assist PT Short Term Goal 5 (Week 2): Pt will maintain static stance w/ BUE support on RW w/ min assist for 30 sec at a time  Skilled Therapeutic Interventions/Progress Updates:    Pt supine in bed upon PT arrival, agreeable to therapy tx and reports pain 4/10. Pt performed lower body dressing while supine in bed via rolling and bridges with min assist, total assist to loop LEs through shorts and pull over hips. Pt transferred from supine>sitting EOB with min assist, verbal cues for techniques. Pt sitting EOB donned clean shirt with min assist. Pt performed stand pivot transfers with max assist + 2 to stand and mod assist during pivot. Pt continues to ask about R ankle brace for stability, PA aware. Resistant to therapist education and recommendations throughout session. Pt propelled w/c from room>gym with supervision and increased time x 100 ft using B LEs and R UE. Pt performed car transfer stand pivot with L platform RW, mod assist, assist for LE management to bring LEs into car. Discussed getting measurements for cars at home so we can better simulate. Pt ambulated x 148 ft with min assist and L platform RW, verbal cues for step length and upright posture, increased time secondary to slow gait speed. Pt propelled w/c back to room with supervision and left seated with needs in reach.   Therapy  Documentation Precautions:  Precautions Precautions: Fall Precaution Comments: Severe RA in shoulders, left hand Required Braces or Orthoses: Other Brace/Splint Other Brace/Splint: Bilateral hinged knee braces for comfort and support. Restrictions Weight Bearing Restrictions: No   See Function Navigator for Current Functional Status.   Therapy/Group: Individual Therapy  Netta Corrigan, PT, DPT 05/25/2017, 7:23 AM

## 2017-05-25 NOTE — Progress Notes (Signed)
[facility] PHYSICAL MEDICINE & REHABILITATION     PROGRESS NOTE  Subjective/Complaints:  Pt seen wheeling his wheelchair with therapies this AM.  He states he slept well overnight.   ROS: Denies CP, SOB, nausea, vomiting, diarrhea.  Objective: Vital Signs: Blood pressure (!) 141/82, pulse 77, temperature 97.6 F (36.4 C), temperature source Axillary, resp. rate 18, height 6' (1.829 m), weight 127.3 kg (280 lb 11.2 oz), SpO2 94 %. No results found. Recent Labs    05/22/17 1822  WBC 18.9*  HGB 9.0*  HCT 27.0*  PLT 549*   Recent Labs    05/22/17 1822  NA 129*  K 3.9  CL 92*  GLUCOSE 261*  BUN 89*  CREATININE 6.29*  CALCIUM 8.5*   CBG (last 3)  Recent Labs    05/24/17 1624 05/24/17 2143 05/25/17 0617  GLUCAP 145* 266* 248*    Wt Readings from Last 3 Encounters:  05/25/17 127.3 kg (280 lb 11.2 oz)  05/13/17 132.1 kg (291 lb 3.6 oz)  04/02/17 (!) 154.9 kg (341 lb 9.6 oz)    Physical Exam:  BP (!) 141/82 (BP Location: Right Arm)   Pulse 77   Temp 97.6 F (36.4 C) (Axillary)   Resp 18   Ht 6' (1.829 m)   Wt 127.3 kg (280 lb 11.2 oz)   SpO2 94%   BMI 38.07 kg/m  Constitutional: Obese male. NAD.  HENT: Normocephalic. Atraumatic. Oral: Blisters with ulceration noted on lips and tongue, continues to improve Eyes:EOMare normal. No discharge.  Cardiovascular: RRR. No JVD. Respiratory:Effort normal and breath sounds normal.  GI: Bowel sounds are normal. Nondistended.  Musc:  Bouchard's deformities  TTP left hand with edema +Edema b/l LE Neurological: Alert and oriented.  Motor: Right upper extremity 4-/5 proximal to distal (pain inhibition) Left upper extremity: Shoulder abduction, elbow flexion/extension 4-/5, hand grip 2-/5 (pain inhibition, stable) Bilateral lower extremities: 3+-4-/5 proximal to distal (some pain inhibition, gradually improving)  Sensation is intact to light touch bilateral upper and lower limbs Skin. LE ischemic changes.    Assessment/Plan: 1. Functional deficits secondary to debility which require 3+ hours per day of interdisciplinary therapy in a comprehensive inpatient rehab setting. Physiatrist is providing close team supervision and 24 hour management of active medical problems listed below. Physiatrist and rehab team continue to assess barriers to discharge/monitor patient progress toward functional and medical goals.  Function:  Bathing Bathing position   Position: Sitting EOB  Bathing parts Body parts bathed by patient: Right arm, Left arm, Chest, Abdomen, Front perineal area, Right upper leg, Left upper leg, Right lower leg, Left lower leg Body parts bathed by helper: Back, Buttocks  Bathing assist Assist Level: 2 helpers(MOD A; 2 helpers sit to stand for buttock hygiene)      Upper Body Dressing/Undressing Upper body dressing   What is the patient wearing?: Pull over shirt/dress     Pull over shirt/dress - Perfomed by patient: Thread/unthread right sleeve, Thread/unthread left sleeve, Put head through opening Pull over shirt/dress - Perfomed by helper: Pull shirt over trunk        Upper body assist Assist Level: Supervision or verbal cues(per OT note)      Lower Body Dressing/Undressing Lower body dressing Lower body dressing/undressing activity did not occur: N/A What is the patient wearing?: Underwear, Pants, Shoes Underwear - Performed by patient: Thread/unthread right underwear leg, Thread/unthread left underwear leg Underwear - Performed by helper: Pull underwear up/down Pants- Performed by patient: Thread/unthread left pants leg, Thread/unthread right  pants leg Pants- Performed by helper: Pull pants up/down         Shoes - Performed by patient: Don/doff right shoe, Don/doff left shoe Shoes - Performed by helper: Don/doff right shoe, Don/doff left shoe(slippers)          Lower body assist Assist for lower body dressing: 2 Helpers      Toileting Toileting   Toileting  steps completed by patient: Adjust clothing prior to toileting Toileting steps completed by helper: Performs perineal hygiene, Adjust clothing after toileting    Toileting assist Assist level: More than reasonable time   Transfers Chair/bed transfer Chair/bed transfer activity did not occur: Refused Chair/bed transfer method: Stand pivot Chair/bed transfer assist level: 2 helpers Chair/bed transfer assistive device: Environmental consultant, Marine scientist lift: Ecologist Ambulation activity did not occur: Refused   Max distance: 81ft Assist level: Moderate assist (Pt 50 - 74%)   Wheelchair Wheelchair activity did not occur: Refused Type: Manual Max wheelchair distance: 100' Assist Level: Supervision or verbal cues  Cognition Comprehension Comprehension assist level: Follows complex conversation/direction with no assist  Expression Expression assist level: Expresses complex ideas: With no assist  Social Interaction Social Interaction assist level: Interacts appropriately with others with medication or extra time (anti-anxiety, antidepressant).  Problem Solving Problem solving assist level: Solves complex 90% of the time/cues < 10% of the time  Memory Memory assist level: Complete Independence: No helper    Medical Problem List and Plan: 1.Debilitysecondary to cardiorenal syndrome, synovitis left hand due to RA   Cont CIR    2. DVT Prophylaxis/Anticoagulation: Eliquis. Monitor for any bleeding episodes 3. Pain Management:Neurontin 300 mg daily, Robaxin as needed   Voltaren gel added for joint pain.   Neuropathic pain Left hand - cont gabapentin, started on capsasin cream .025%   Lidoderm patch added on 3/5   Improving 4. Mood:Provide emotional support 5. Neuropsych: This patientiscapable of making decisions on hisown behalf. 6. Skin/Wound Care:Routine skin checks 7. Fluids/Electrolytes/Nutrition:Routine I&O's 8.End-stage renal disease. Hemodialysis  initiated Tuesday Thursday Saturday schedule. Patient with left AVFgraft placed 05/10/2017   Appreciate nephro recs. 9.Acute on chronic anemia. Continue Aranesp.    Hb 9.0 on 3/9   Cont to monitor 10.Steroid induced hyperglycemia on Diabetes mellitus with peripheral neuropathy. Latest hemoglobin A1c 9.2. Diabetic teaching   Lantus insulin 25 units daily at bedtime, increased to 30 on 3/8, will need to decrease with taper of steroids   NovoLog 3 units 3 times a day, increased to 6 on 3/4.    Monitor with increased mobility   Elevated on 3/12 11.Acute on chronic diastolic congestive heart failure. Monitor for any signs of fluid overload. Continue Coreg 6.25mg  BID  Filed Weights   05/23/17 0600 05/24/17 0114 05/25/17 0630  Weight: 118.6 kg (261 lb 7.5 oz) 119 kg (262 lb 5.6 oz) 127.3 kg (280 lb 11.2 oz)     Unreliable 12.Rheumatoid arthritis. Prednisone 10 mg daily. Patient had been receiving Abataceptevery Tuesday prior to admission. Added Voltaren gel. Follow-up rheumatologist Dr.Ziokowska   MRI right knee reviewed, showing OA, meniscal tear and patellar tendinosis on right.  Per Ortho, braces PRN   DMARD resumed on 3/5    Started on high-dose 6 week steroid taper on 3/6  13.Atrial fibrillation RVR. Status post cardioversion. Amiodarone decreased to 200 mg daily 3/3 14.Leukocytosis.     Low-grade fever: Resolved   WBCs 18.9 on 3/9   Hand films reviewed, suggesting RA changes   Continue to monitor  Cont to monitor 15.  Left hand numbness   Likely related to fistula placement  16. Labile blood pressure  Controlled on 3/12 17. 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 Constipation   Bowel regimen increased on 3/7  LOS (Days) 12 A FACE TO FACE EVALUATION WAS PERFORMED  Ankit Lorie Phenix 05/25/2017 9:28 AM

## 2017-05-25 NOTE — Progress Notes (Signed)
[facility] Rounding Note  Background: PMH systolic and diastolic HF EF 67%, DM2, HLD, RA, known CKD (Dr. Moshe Cipro). Adm 04/2017 massive volume overload. HD initiated for renal failure, fluid.  Had temp cath initially, then [facility] 2/19, L RC AVF 05/10/17. CLIPPED to [place] TTS. On rehab d/t severe deconditioning.     Assessment/Plan:  1. New ESRD 2/2 DM/HTN. 1st HD inpt 04/28/17. Weight down sig.  Still vol xs. 4L off last couple of TMT's. Appears controversy re outpt schedule. Maybe TTS at [facility] (but wants MWF) vs MWF at [facility]. Establishing EDW. Lowest post weight so far 118.6 kg.  Still has some edema. BP tolerating well. Next HD on Thursday. Have been doing 4.5 hour treatments. EDW not yet established. 2. Anemia Darbe 100 QTuesday with HD. Last dosed 3/5.  ^ d dose to 150 3. HPTH calcitriol QOD - changed to HD TTS. PTH 230 on 05/11/17- needs binder- phoslo with meals 4. Malnutrition - renal vit/suppls 5. DM controlled 6. Afib on amio/apixaban 7. Obesity 8. DJD 9. RA - pred taper + topicals 10. Neuropathy 11. Leukocytosis WBC remains up-  Monitoring. No ATB's.  Eliyanah Elgersma A 05/23/2017, 2:37 PM   Subjective:  Seen on HD- sleepy Current assignment is TTS [place] (not MWF as he thought - and wants)- now is saying will go to [place] on MWF ?? Will clarify prior to discharge  So current inpt schedule OK and conducive to more rehab   Objective: Vital signs in last 24 hours: Temp:  [97.6 F (36.4 C)] 97.6 F (36.4 C) (03/12 0050) Pulse Rate:  [77] 77 (03/12 0050) Resp:  [18] 18 (03/12 0050) BP: (141)/(82) 141/82 (03/12 0050) SpO2:  [94 %] 94 % (03/12 0050) Weight:  [127.3 kg (280 lb 11.2 oz)] 127.3 kg (280 lb 11.2 oz) (03/12 0630) Weight change: 8.325 kg (18 lb 5.6 oz)  Pastor in the room with him Great spirits NAD  Ant clear lung fields S1S2 No S3 + 2/6 murmur LSB Chronic skin changes LE's 2+ pitting edema Legs dyesethetic Compression glove on left hand L AVF + bruit  (2/25) R IJ TDC (2/19) dry dressing in place   Recent Labs    05/22/17 1822 05/25/17 1439  NA 129* 133*  K 3.9 3.6  CL 92* 96*  CO2 21* 19*  GLUCOSE 261* 171*  BUN 89* 125*  CREATININE 6.29* 6.87*  CALCIUM 8.5* 8.7*  PHOS 6.1* 6.4*   Recent Labs    05/22/17 1822 05/25/17 1439  ALT 12*  --   ALBUMIN 2.4* 2.7*   Recent Labs    05/22/17 1822 05/25/17 1439  WBC 18.9* 23.7*  HGB 9.0* 8.9*  HCT 27.0* 28.0*  MCV 80.8 83.6  PLT 549* 630*   Medications . Abatacept  125 mg Subcutaneous Q Tue  . acyclovir  200 mg Oral BID  . amiodarone  200 mg Oral Daily  . apixaban  5 mg Oral BID  . calcitRIOL  0.5 mcg Oral Q T,Th,Sa-HD  . capsaicin   Topical BID  . darbepoetin (ARANESP) injection - DIALYSIS  150 mcg Intravenous Q Tue-HD  . feeding supplement (NEPRO CARB STEADY)  237 mL Oral BID BM  . feeding supplement (PRO-STAT SUGAR FREE 64)  30 mL Oral BID  . fluticasone  2 spray Each Nare Daily  . gabapentin  300 mg Oral Daily  . Gerhardt's butt cream   Topical BID  . insulin aspart  0-15 Units Subcutaneous TID WC  . insulin aspart  6 Units Subcutaneous TID WC  . insulin glargine  30 Units Subcutaneous QHS  . lidocaine  3 patch Transdermal Q24H  . multivitamin  1 tablet Oral QHS  . [START ON 05/26/2017] predniSONE  50 mg Oral Q breakfast   Followed by  . [START ON 06/02/2017] predniSONE  40 mg Oral Q breakfast   Followed by  . [START ON 06/09/2017] predniSONE  30 mg Oral Q breakfast   Followed by  . [START ON 06/16/2017] predniSONE  20 mg Oral Q breakfast   Followed by  . [START ON 06/23/2017] predniSONE  10 mg Oral Q breakfast  . senna-docusate  2 tablet Oral QHS

## 2017-05-25 NOTE — Progress Notes (Signed)
Social Work Patient ID: John Bailey, male   DOB: December 07, 1970, 47 y.o.   MRN: [id number]  Met with pt to discuss applying for SSD. Discussed with paper application, on-line or go to the local office. He wants to do the paper copy. Will get him the application and have him and his wife complete the part they can and give back to worker to complete the medical piece. Pt is doing much better and feels better about his progress this week in therapies. Aware team conference tomorrow.

## 2017-05-25 NOTE — Progress Notes (Addendum)
Physical Therapy Session Note  Patient Details  Name: John Bailey MRN: [id number] Date of Birth: 1971/02/07  Today's Date: 05/25/2017 PT Individual Time: 1100-1200 PT Individual Time Calculation (min): 60 min   Short Term Goals: Week 2:  PT Short Term Goal 1 (Week 2): Pt will initiate gait training w/ LRAD PT Short Term Goal 2 (Week 2): Pt will transfer sit<>stand max assist x1 using LRAD PT Short Term Goal 3 (Week 2): Pt will transfer bed<>chair w/ max assist x1 using LRAD PT Short Term Goal 4 (Week 2): Pt will demonstrate scooting and adjusting in w/c for pressure relief and comfort w/ mod assist PT Short Term Goal 5 (Week 2): Pt will maintain static stance w/ BUE support on RW w/ min assist for 30 sec at a time  Skilled Therapeutic Interventions/Progress Updates:   Handoff from OT. Pt reporting being frustrated with his schedule today and reports already being exhausted from all therapy he has had since 730 this morning. Educated pt on purpose of schedule like this on HD days and encouraged pt that we would take as many breaks as he needed and allowed him to guide the therapy session to increase his level of control. Pt agreeable and willing. Pt elected to use Kinetron first - from seated position in w/c performed 10 min with intermittent rest breaks for reciprocal movement pattern re-training and functional strengthening/endurance while discussing progression of goals and limitations. Pt reported that his difficulty with sit <> stands is not a new issue and that he has required assistance for this for several years. He only sits in a lift chair at home or has someone help him stand up if he is in a lower chair in the community. Pt reports this is due to his R knee. Initially pt agreeable to work on sit <> stands, but once set up in front of mat, pt stating he did not want to do this and easily became frustrated with this PT. Pt then was very apologetic for his outburst the rest of the  session. Focused on sit <> stands from w/c and elevated mat table, transfer w/c <> mat with L PFRW, and toe taps to 6" and 2" step to progress functional mobility. Pt requires overall max assist for sit <> stands with +2 needed at times to stabilize w/c and mod assist from elevated surface with PT facilitating anterior weightshift and providing cues for correct foot placement. Pt able to tap 6" step with RLE but unable to stabilize through RLE to perform toe tap with LLE despite PT blocking knee. Adjusted to 2" step for LLE toe taps and able to complete with PT blocking R knee and able to demonstrate active quad contraction to demonstrate improved stabilization. End of session transferred back to bed and positioned for lunch. Left with RN and CSW present.   Therapy Documentation Precautions:  Precautions Precautions: Fall Precaution Comments: Severe RA in shoulders, left hand Required Braces or Orthoses: Other Brace/Splint Other Brace/Splint: Bilateral hinged knee braces for comfort and support. Restrictions Weight Bearing Restrictions: No  Pain:  reports ongoing R knee and ankle pain and generalized fatigue. RN had already been made aware by previous therapist.    See Function Navigator for Current Functional Status.   Therapy/Group: Individual Therapy  Canary Brim Ivory Broad, PT, DPT  05/25/2017, 12:10 PM

## 2017-05-26 ENCOUNTER — Inpatient Hospital Stay (HOSPITAL_COMMUNITY): Payer: BLUE CROSS/BLUE SHIELD | Admitting: Physical Therapy

## 2017-05-26 ENCOUNTER — Inpatient Hospital Stay (HOSPITAL_COMMUNITY): Payer: BLUE CROSS/BLUE SHIELD | Admitting: Occupational Therapy

## 2017-05-26 LAB — GLUCOSE, CAPILLARY
GLUCOSE-CAPILLARY: 189 mg/dL — AB (ref 65–99)
GLUCOSE-CAPILLARY: 198 mg/dL — AB (ref 65–99)
GLUCOSE-CAPILLARY: 200 mg/dL — AB (ref 65–99)
Glucose-Capillary: 304 mg/dL — ABNORMAL HIGH (ref 65–99)
Glucose-Capillary: 294 mg/dL — ABNORMAL HIGH (ref 65–99)

## 2017-05-26 NOTE — Progress Notes (Signed)
Physical Therapy Session Note  Patient Details  Name: John Bailey MRN: [id number] Date of Birth: March 21, 1970  Today's Date: 05/26/2017 PT Individual Time: 0800-0855 PT Individual Time Calculation (min): 55 min   Short Term Goals: Week 2:  PT Short Term Goal 1 (Week 2): Pt will initiate gait training w/ LRAD PT Short Term Goal 2 (Week 2): Pt will transfer sit<>stand max assist x1 using LRAD PT Short Term Goal 3 (Week 2): Pt will transfer bed<>chair w/ max assist x1 using LRAD PT Short Term Goal 4 (Week 2): Pt will demonstrate scooting and adjusting in w/c for pressure relief and comfort w/ mod assist PT Short Term Goal 5 (Week 2): Pt will maintain static stance w/ BUE support on RW w/ min assist for 30 sec at a time  Skilled Therapeutic Interventions/Progress Updates:   Pt in supine and agreeable to therapy, denies pain at rest and c/o 5/10 R foot pain w/ activity. Transferred to EOB w/ supervision and provided w/ min assist to change shirt and total assist to don pants and shoes at EOB. Stood to [place] w/ mod assist from elevated bed surface when pulling pants up. Transferred to w/c via stand pivot w/ mod assist. Total assist w/c transport to/from gym for time management. Worked on endurance w/ functional mobility and LE strengthening this session. Performed multiple sit<>stands from w/c throughout session, mod assist fading to max assist w/ increased fatigue. Pt able to set-up transfer w/o prompting, sliding forward and performing anterior weight-shifting. Ambulated 140' w/ min guard using PFRW. Performed partial knee bends 1x10 and knee marches 1x10 w/ min guard for LE strengthening. Negotiated 2" step, performing step-to w/ PFRW. Min assist to negotiated 2x5 both LEs. Returned to room and ended session in w/c, call bell within reach and all needs met.   Therapy Documentation Precautions:  Precautions Precautions: Fall Precaution Comments: Severe RA in shoulders, left hand Required Braces  or Orthoses: Other Brace/Splint Other Brace/Splint: Bilateral hinged knee braces for comfort and support. Restrictions Weight Bearing Restrictions: No  See Function Navigator for Current Functional Status.   Therapy/Group: Individual Therapy  Kursten Kruk K Arnette 05/26/2017, 9:08 AM

## 2017-05-26 NOTE — Progress Notes (Signed)
[place] PHYSICAL MEDICINE & REHABILITATION     PROGRESS NOTE  Subjective/Complaints:  Pt seen lying in bed this AM.  He states he slept well in HD yesterday, but could not sleep after he returned to the room.  He has concerns about his HD sessions.   ROS: Denies CP, SOB, nausea, vomiting, diarrhea.  Objective: Vital Signs: Blood pressure 127/74, pulse 89, temperature 98.5 F (36.9 C), temperature source Oral, resp. rate 16, height 6' (1.829 m), weight 119.2 kg (262 lb 12.6 oz), SpO2 96 %. No results found. Recent Labs    05/25/17 1439  WBC 23.7*  HGB 8.9*  HCT 28.0*  PLT 630*   Recent Labs    05/25/17 1439  NA 133*  K 3.6  CL 96*  GLUCOSE 171*  BUN 125*  CREATININE 6.87*  CALCIUM 8.7*   CBG (last 3)  Recent Labs    05/25/17 1215 05/25/17 2121 05/26/17 0703  GLUCAP 169* 165* 189*    Wt Readings from Last 3 Encounters:  05/26/17 119.2 kg (262 lb 12.6 oz)  05/13/17 132.1 kg (291 lb 3.6 oz)  04/02/17 (!) 154.9 kg (341 lb 9.6 oz)    Physical Exam:  BP 127/74 (BP Location: Right Arm)   Pulse 89   Temp 98.5 F (36.9 C) (Oral)   Resp 16   Ht 6' (1.829 m)   Wt 119.2 kg (262 lb 12.6 oz)   SpO2 96%   BMI 35.64 kg/m  Constitutional: Obese male. NAD.  HENT: Normocephalic. Atraumatic. Oral: Blisters with ulceration noted on lips and tongue, improved Eyes:EOMare normal. No discharge.  Cardiovascular: RRR. No JVD. Respiratory:Effort normal and breath sounds normal.  GI: Bowel sounds are normal. Nondistended.  Musc:  Bouchard's deformities  +Edema b/l LE Neurological: Alert and oriented.  Motor: Right upper extremity 4-/5 proximal to distal (pain inhibition) Left upper extremity: Shoulder abduction, elbow flexion/extension 3-4-/5, hand grip 2-/5 (pain inhibition) Bilateral lower extremities: 3+-4-/5 proximal to distal (some pain inhibition, gradually improving)  Sensation is intact to light touch bilateral upper and lower limbs Skin. LE ischemic changes.    Assessment/Plan: 1. Functional deficits secondary to debility which require 3+ hours per day of interdisciplinary therapy in a comprehensive inpatient rehab setting. Physiatrist is providing close team supervision and 24 hour management of active medical problems listed below. Physiatrist and rehab team continue to assess barriers to discharge/monitor patient progress toward functional and medical goals.  Function:  Bathing Bathing position   Position: Sitting EOB  Bathing parts Body parts bathed by patient: Right arm, Left arm, Chest, Abdomen, Front perineal area, Right upper leg, Left upper leg, Right lower leg, Left lower leg Body parts bathed by helper: Buttocks, Back  Bathing assist Assist Level: 2 helpers(MOD A; 2 helpers sit to stand for buttock hygiene)      Upper Body Dressing/Undressing Upper body dressing   What is the patient wearing?: Pull over shirt/dress     Pull over shirt/dress - Perfomed by patient: Thread/unthread right sleeve, Thread/unthread left sleeve, Put head through opening Pull over shirt/dress - Perfomed by helper: Pull shirt over trunk        Upper body assist Assist Level: Supervision or verbal cues      Lower Body Dressing/Undressing Lower body dressing Lower body dressing/undressing activity did not occur: N/A What is the patient wearing?: Underwear, Pants, Shoes Underwear - Performed by patient: Thread/unthread right underwear leg, Thread/unthread left underwear leg Underwear - Performed by helper: Pull underwear up/down Pants- Performed by patient:  Thread/unthread left pants leg, Thread/unthread right pants leg Pants- Performed by helper: Pull pants up/down         Shoes - Performed by patient: Don/doff right shoe, Don/doff left shoe Shoes - Performed by helper: Don/doff right shoe, Don/doff left shoe(slippers)          Lower body assist Assist for lower body dressing: 2 Helpers      Toileting Toileting   Toileting steps  completed by patient: Adjust clothing prior to toileting Toileting steps completed by helper: Performs perineal hygiene, Adjust clothing after toileting    Toileting assist Assist level: More than reasonable time   Transfers Chair/bed transfer Chair/bed transfer activity did not occur: Refused Chair/bed transfer method: Stand pivot Chair/bed transfer assist level: Maximal assist (Pt 25 - 49%/lift and lower) Chair/bed transfer assistive device: Walker, Armrests Mechanical lift: Ecologist Ambulation activity did not occur: Refused   Max distance: 148 ft Assist level: Touching or steadying assistance (Pt > 75%)   Wheelchair Wheelchair activity did not occur: Refused Type: Manual Max wheelchair distance: 150 ft Assist Level: Supervision or verbal cues  Cognition Comprehension Comprehension assist level: Follows complex conversation/direction with no assist  Expression Expression assist level: Expresses complex ideas: With no assist  Social Interaction Social Interaction assist level: Interacts appropriately with others with medication or extra time (anti-anxiety, antidepressant).  Problem Solving Problem solving assist level: Solves complex 90% of the time/cues < 10% of the time  Memory Memory assist level: Complete Independence: No helper    Medical Problem List and Plan: 1.Debilitysecondary to cardiorenal syndrome, synovitis left hand due to RA   Cont CIR    2. DVT Prophylaxis/Anticoagulation: Eliquis. Monitor for any bleeding episodes 3. Pain Management:Neurontin 300 mg daily, Robaxin as needed   Voltaren gel added for joint pain.   Neuropathic pain Left hand - cont gabapentin, started on capsasin cream .025%   Lidoderm patch added on 3/5   Improving 4. Mood:Provide emotional support 5. Neuropsych: This patientiscapable of making decisions on hisown behalf. 6. Skin/Wound Care:Routine skin checks 7. Fluids/Electrolytes/Nutrition:Routine  I&O's 8.End-stage renal disease. Hemodialysis initiated Tuesday Thursday Saturday schedule. Patient with left AVFgraft placed 05/10/2017   Appreciate nephro recs. 9.Acute on chronic anemia. Continue Aranesp.    Hb 8.9 on 3/12   Cont to monitor 10.Steroid induced hyperglycemia on Diabetes mellitus with peripheral neuropathy. Latest hemoglobin A1c 9.2. Diabetic teaching   Lantus insulin 25 units daily at bedtime, increased to 30 on 3/8, will need to decrease with taper of steroids   NovoLog 3 units 3 times a day, increased to 6 on 3/4.    Monitor with increased mobility   Elevated on 3/13, plan to decrease steroid today 11.Acute on chronic diastolic congestive heart failure. Monitor for any signs of fluid overload. Continue Coreg 6.25mg  BID  Filed Weights   05/25/17 1415 05/25/17 1915 05/26/17 0410  Weight: 123.3 kg (271 lb 13.2 oz) 118.6 kg (261 lb 7.5 oz) 119.2 kg (262 lb 12.6 oz)     Unreloable 12.Rheumatoid arthritis. Prednisone 10 mg daily. Patient had been receiving Abataceptevery Tuesday prior to admission. Added Voltaren gel. Follow-up rheumatologist Dr.Ziokowska   MRI right knee reviewed, showing OA, meniscal tear and patellar tendinosis on right.  Per Ortho, braces PRN   DMARD resumed on 3/5    Started on high-dose 6 week steroid taper on 3/6  13.Atrial fibrillation RVR. Status post cardioversion. Amiodarone decreased to 200 mg daily 3/3 14.Leukocytosis.     Low-grade fever: Resolved  WBCs 23.7 on 3/13   Hand films reviewed, suggesting RA changes   Continue to monitor   Cont to monitor 15.  Left hand numbness   Likely related to fistula placement  16. Labile blood pressure  Controlled on 3/13 17. 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 Constipation   Bowel regimen increased on 3/7  LOS (Days) 13 A FACE TO FACE EVALUATION WAS PERFORMED  John Bailey Lorie Phenix 05/26/2017 8:36 AM

## 2017-05-26 NOTE — Progress Notes (Signed)
Occupational Therapy Session Note  Patient Details  Name: John Bailey MRN: [id number] Date of Birth: March 05, 1971  Today's Date: 05/26/2017 OT Individual Time: 4158-3094 OT Individual Time Calculation (min): 57 min    Short Term Goals: Week 2:  OT Short Term Goal 1 (Week 2): Pt will complete sit to stand with max assist in preparation for LB selfcare tasks.  OT Short Term Goal 2 (Week 2): Pt will complete sliding board transfer with max assist from bed to wheelchair or drop arm commode. OT Short Term Goal 3 (Week 2): Pt will donn pullup shorts with total assist +2 (pt 40%) sit to stand.   OT Short Term Goal 4 (Week 2): Pt will complete LB bathing with total assist +2 (pt 40%) sit to stand.    Skilled Therapeutic Interventions/Progress Updates:    Pt completed toilet transfer with mod assist ambulating from the wheelchair at bedside to the 3:1 over the toilet.  He was able to manage his clothing in standing prior to and after toileting, but needed max assist for toilet hygiene.  Once toileting was completed he ambulated out to the sink for completion of washing hands in standing as well as brushing his teeth with min guard assist while still standing.  He sat down in the wheelchair to wash his face.  Therapist applied foam grips to his wheelchair to assist with locking and unlocking wheelchair brakes.  Finished session with AAROM/AROM  for BUE exercises of shoulder flexion and shoulder abduction for 5- 10 repetitions.  Pt left in the wheelchair with call button and phone in reach.    Therapy Documentation Precautions:  Precautions Precautions: Fall Precaution Comments: Severe RA in shoulders, left hand Required Braces or Orthoses: Other Brace/Splint Other Brace/Splint: Bilateral hinged knee braces for comfort and support. Restrictions Weight Bearing Restrictions: No  Pain: Pain Assessment Pain Assessment: Faces Faces Pain Scale: Hurts a little bit Pain Type: Chronic pain Pain  Location: Foot Pain Orientation: Right;Left Pain Descriptors / Indicators: Discomfort Pain Onset: On-going Pain Intervention(s): Repositioned ADL: See Function Navigator for Current Functional Status.   Therapy/Group: Individual Therapy  Noelene Gang OTR/L 05/26/2017, 4:59 PM

## 2017-05-26 NOTE — Progress Notes (Signed)
Physical Therapy Session Note  Patient Details  Name: John Bailey MRN: [id number] Date of Birth: [birth year]  Today's Date: 05/26/2017 PT Individual Time: 1300-1415 PT Individual Time Calculation (min): 75 min   Short Term Goals: Week 2:  PT Short Term Goal 1 (Week 2): Pt will initiate gait training w/ LRAD PT Short Term Goal 2 (Week 2): Pt will transfer sit<>stand max assist x1 using LRAD PT Short Term Goal 3 (Week 2): Pt will transfer bed<>chair w/ max assist x1 using LRAD PT Short Term Goal 4 (Week 2): Pt will demonstrate scooting and adjusting in w/c for pressure relief and comfort w/ mod assist PT Short Term Goal 5 (Week 2): Pt will maintain static stance w/ BUE support on RW w/ min assist for 30 sec at a time  Skilled Therapeutic Interventions/Progress Updates:    Pt seated in w/c in room, agreeable to participate in therapy session. Pt reports no pain this PM. Manual w/c propulsion x 50 ft, x 150 ft with SBA, use of RUE and BLE. Sit to stand with max assist from w/c to L PFRW. Ambulation 2 x 200 ft with L PFRW and min assist for balance. Sit to stand x 5 reps from elevated mat table to [place] with min assist, focus on LE placement and weight shift forwards. Sit to/from supine with SBA, rolling L/R on mat with SBA. Supine upper thoracic stretch x 5 min with towel roll between shoulder blades. Seated shoulder rolls fwd/back x 15 reps each direction. Pt exhibits improved motivation, participation, and performance during therapy sessions this date. Pt left seated in w/c in room with needs in reach.  Therapy Documentation Precautions:  Precautions Precautions: Fall Precaution Comments: Severe RA in shoulders, left hand Required Braces or Orthoses: Other Brace/Splint Other Brace/Splint: Bilateral hinged knee braces for comfort and support. Restrictions Weight Bearing Restrictions: No   See Function Navigator for Current Functional Status.   Therapy/Group: Individual Therapy    Excell Seltzer, PT, DPT  05/26/2017, 4:21 PM

## 2017-05-26 NOTE — Patient Care Conference (Signed)
Inpatient RehabilitationTeam Conference and Plan of Care Update Date: 05/26/2017   Time: 2:15 PM    Patient Name: John Bailey      Medical Record Number: [id number]  Date of Birth: [birth year] Sex: Male         Room/Bed: 4M08C/4M08C-01 Payor Info: Payor: BLUE CROSS BLUE SHIELD / Plan: BCBS OTHER / Product Type: *No Product type* /    Admitting Diagnosis: Debility  Admit Date/Time:  05/13/2017  6:09 PM Admission Comments: No comment available   Primary Diagnosis:  <principal problem not specified> Principal Problem: <principal problem not specified>  Patient Active Problem List   Diagnosis Date Noted  . Constipation   . Steroid-induced hyperglycemia   . HSV (herpes simplex virus) infection   . Neuropathic pain   . Labile blood pressure   . Labile blood glucose   . Carpal tunnel syndrome of left wrist   . Pain   . Leukocytosis   . PAF (paroxysmal atrial fibrillation) ([place])   . Acute on chronic diastolic (congestive) heart failure ([place])   . Type 2 diabetes mellitus with peripheral neuropathy (HCC)   . Acute blood loss anemia   . Anemia of chronic disease   . ESRD on dialysis ([place])   . Cardiorenal syndrome with renal failure   . Cardiorenal syndrome, stage 1-4 or unspecified chronic kidney disease, with heart failure ([place]) 05/06/2017  . Atrial fibrillation, chronic ([place]) 04/22/2017  . Hypoxia 04/22/2017  . Excessive daytime sleepiness 04/02/2017  . Debility 04/02/2017  . Bradycardia 03/28/2017  . CKD (chronic kidney disease) stage 4, GFR 15-29 ml/min (HCC) 03/28/2017  . Pseudogout 03/09/2017  . Rheumatoid arthritis ([place]) 03/09/2017  . Infected ulcer of skin, with fat layer exposed ([place]) 03/09/2017  . Normocytic anemia 03/06/2017  . Elevated troponin 03/06/2017  . Acute renal failure superimposed on stage 4 chronic kidney disease ([place]) 03/06/2017  . Essential hypertension 12/29/2016  . Persistent atrial fibrillation ([place])   . Left leg cellulitis   . Atrial fibrillation  with RVR ([place]) 12/02/2016  . Sepsis ([place]) 12/02/2016  . Acute on chronic congestive heart failure ([place])   . Acute on chronic renal insufficiency   . Chronic diastolic CHF (congestive heart failure) ([place]) 03/20/2016  . Nonischemic cardiomyopathy ([place]) 11/11/2015  . Long term (current) use of anticoagulants [Z79.01] 08/14/2015  . Insulin-dependent diabetes mellitus with renal complications ([place]) 59/16/3846  . Atrial flutter with rapid ventricular response ([place]) 08/06/2015  . Hyponatremia 07/30/2015  . Acute kidney injury ([place]) 07/30/2015  . Hyperkalemia 07/30/2015  . Acute on chronic diastolic CHF (congestive heart failure) ([place]) 07/30/2015  . Morbid obesity ([place]) 07/30/2015  . Paroxysmal atrial fibrillation ([place]) 07/30/2015  . 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 Hypertensive heart disease with heart failure ([place]) 07/14/2012  . Dyspnea 07/14/2012  . Hyperlipidemia 07/14/2012    Expected Discharge Date: Expected Discharge Date: 06/03/17  Team Members Present: Physician leading conference: Dr. Delice Lesch Social Worker Present: Ovidio Kin, LCSW Nurse Present: Benjie Karvonen, RN PT Present: Loreen Freud, PT OT Present: Clyda Greener, OT PPS Coordinator present : Daiva Nakayama, RN, CRRN     Current Status/Progress Goal Weekly Team Focus  Medical   Debility secondary to cardiorenal syndrome, synovitis left hand due to RA  Improve mobility, RA, HSV, DM  See above   Bowel/Bladder   continent of b/b last bm 05/26/17  remain contenent of b/b   assess q shift and prn   Swallow/Nutrition/ Hydration  ADL's   Supervision for UB selfcare and Min - mod  assist for LB selfcare sit to stand.  Mod assist for functional transfers to the 3:1.  He needed max assist for toileting tasks as well  min to mod assist  selfcare retraining, balance retraining, transfer training, therapeutic exercise, pt/family education   Mobility   min-mod assist bed  mobility, max assist for sit<>stand (can be +2 from lower surface), ambulation up to 148 ft with L platform RW and mod assist  Min assist overall   initiate stairs, gait, transfers, LE strength   Communication             Safety/Cognition/ Behavioral Observations            Pain   pt states mild pain refuses pain medication  pt pain will be <=3   assess q shift and prn medicate prn and assess for relief   Skin   L wrist fistula R chest HD cath edema to LUE BLE MASD to buttock   free from infection and further breakdown  assess q shift and prn gerhardts butt cream and foam to buttock      *See Care Plan and progress notes for long and short-term goals.     Barriers to Discharge  Current Status/Progress Possible Resolutions Date Resolved   Physician    Medical stability;Other (comments);Hemodialysis;Decreased caregiver support;Lack of/limited family support  RA  See above  Therapies, optimize BP/DM meds, cont anitvirals, RA meds resumed      Nursing                  PT                    OT                  SLP                SW                Discharge Planning/Teaching Needs:  Home with wife who is there in the evenings and church members/friends who will be in and out during the day      Team Discussion:  Making better progress in therapies this week after steroids for RA given. BS elevated and checking labs-elevated white count. Fatigues easily needs rest breaks. Goals can be upgraded back to min assist level due to progress this week in therapies. Less pain in his legs can bear weight more on them. Still has difficulty with toileting hygiene due to ROM. Will need  Revisions to Treatment Plan:  DC 3/21    Continued Need for Acute Rehabilitation Level of Care: The patient requires daily medical management by a physician with specialized training in physical medicine and rehabilitation for the following conditions: Daily direction of a multidisciplinary physical  rehabilitation program to ensure safe treatment while eliciting the highest outcome that is of practical value to the patient.: Yes Daily medical management of patient stability for increased activity during participation in an intensive rehabilitation regime.: Yes Daily analysis of laboratory values and/or radiology reports with any subsequent need for medication adjustment of medical intervention for : Diabetes problems;Wound care problems;Blood pressure problems;Other  Elease Hashimoto 05/26/2017, 3:31 PM

## 2017-05-26 NOTE — Progress Notes (Signed)
Orthopedic Tech Progress Note Patient Details:  John Bailey 19-May-1970 [id number]  Ortho Devices Type of Ortho Device: ASO Ortho Device/Splint Location: rle Ortho Device/Splint Interventions: Application   Post Interventions Patient Tolerated: Well Instructions Provided: Care of device   [name] 05/26/2017, 8:28 AM

## 2017-05-26 NOTE — Progress Notes (Signed)
[facility] Rounding Note  Background: PMH systolic and diastolic HF EF 31%, DM2, HLD, RA, known CKD (Dr. Moshe Cipro). Adm 04/2017 massive volume overload. HD initiated for renal failure, fluid.  Had temp cath initially, then [facility] 2/19, L RC AVF 05/10/17. CLIPPED to [place] TTS vs [place] MWF. On rehab d/t severe deconditioning.     Assessment/Plan:  1. New ESRD 2/2 DM/HTN. 1st HD inpt 04/28/17. Weight down sig.  Still vol xs. 4L off last couple of TMT's. Appears controversy re outpt schedule. Maybe TTS at [facility] (but wants MWF) vs MWF at [facility]. Lowest post weight so far 118.6 kg.  Still has some edema. BP tolerating well. Next HD on Thursday.  EDW not yet established.  Discharge slated for 3/21- Thursday need to clarify OP schedule prior to discharge 2. Anemia Darbe 100 QTuesday with HD. Last dosed 3/5.  ^ d dose to 150 3. HPTH calcitriol QOD - changed to HD TTS. PTH 230 on 05/11/17- - phoslo with meals 4. Malnutrition - renal vit/suppls- albumin improving 5. DM controlled 6. Afib on amio/apixaban 7. Obesity 8. DJD 9. RA - pred taper + topicals 10. Neuropathy 11. Leukocytosis WBC remains up-  Monitoring. No ATB's.  Harpreet Pompey A 05/23/2017, 2:37 PM   Subjective:  Seen on HD- sleepy Current assignment is TTS [place] (not MWF as he thought - and wants)- now is saying will go to [place] on MWF ?? Will clarify prior to discharge  So current inpt schedule OK and conducive to more rehab   Objective: Vital signs in last 24 hours: Temp:  [97.3 F (36.3 C)-98.5 F (36.9 C)] 98.5 F (36.9 C) (03/13 0410) Pulse Rate:  [70-89] 89 (03/13 0410) Resp:  [16-18] 16 (03/13 0410) BP: (113-154)/(62-91) 127/74 (03/13 0410) SpO2:  [95 %-99 %] 96 % (03/13 0410) Weight:  [118.6 kg (261 lb 7.5 oz)-123.3 kg (271 lb 13.2 oz)] 119.2 kg (262 lb 12.6 oz) (03/13 0410) Weight change: -4.025 kg (-14 oz)  Pastor in the room with him Great spirits NAD  Ant clear lung fields S1S2 No S3 + 2/6 murmur LSB Chronic  skin changes LE's 2+ pitting edema Legs dyesethetic Compression glove on left hand L AVF + bruit (2/25) R IJ TDC (2/19) dry dressing in place   Recent Labs    05/25/17 1439  NA 133*  K 3.6  CL 96*  CO2 19*  GLUCOSE 171*  BUN 125*  CREATININE 6.87*  CALCIUM 8.7*  PHOS 6.4*   Recent Labs    05/25/17 1439  ALBUMIN 2.7*   Recent Labs    05/25/17 1439  WBC 23.7*  HGB 8.9*  HCT 28.0*  MCV 83.6  PLT 630*   Medications . Abatacept  125 mg Subcutaneous Q Tue  . amiodarone  200 mg Oral Daily  . apixaban  5 mg Oral BID  . calcitRIOL  0.5 mcg Oral Q T,Th,Sa-HD  . calcium acetate  667 mg Oral TID WC  . capsaicin   Topical BID  . darbepoetin (ARANESP) injection - DIALYSIS  150 mcg Intravenous Q Tue-HD  . feeding supplement (NEPRO CARB STEADY)  237 mL Oral BID BM  . feeding supplement (PRO-STAT SUGAR FREE 64)  30 mL Oral BID  . fluticasone  2 spray Each Nare Daily  . gabapentin  300 mg Oral Daily  . Gerhardt's butt cream   Topical BID  . insulin aspart  0-15 Units Subcutaneous TID WC  . insulin aspart  6 Units Subcutaneous TID WC  .  insulin glargine  30 Units Subcutaneous QHS  . lidocaine  3 patch Transdermal Q24H  . multivitamin  1 tablet Oral QHS  . predniSONE  50 mg Oral Q breakfast   Followed by  . [START ON 06/02/2017] predniSONE  40 mg Oral Q breakfast   Followed by  . [START ON 06/09/2017] predniSONE  30 mg Oral Q breakfast   Followed by  . [START ON 06/16/2017] predniSONE  20 mg Oral Q breakfast   Followed by  . [START ON 06/23/2017] predniSONE  10 mg Oral Q breakfast  . senna-docusate  2 tablet Oral QHS

## 2017-05-27 ENCOUNTER — Inpatient Hospital Stay (HOSPITAL_COMMUNITY): Payer: BLUE CROSS/BLUE SHIELD | Admitting: Physical Therapy

## 2017-05-27 ENCOUNTER — Inpatient Hospital Stay (HOSPITAL_COMMUNITY): Payer: BLUE CROSS/BLUE SHIELD

## 2017-05-27 ENCOUNTER — Inpatient Hospital Stay (HOSPITAL_COMMUNITY): Payer: BLUE CROSS/BLUE SHIELD | Admitting: Occupational Therapy

## 2017-05-27 LAB — GLUCOSE, CAPILLARY
GLUCOSE-CAPILLARY: 197 mg/dL — AB (ref 65–99)
Glucose-Capillary: 222 mg/dL — ABNORMAL HIGH (ref 65–99)
Glucose-Capillary: 173 mg/dL — ABNORMAL HIGH (ref 65–99)
Glucose-Capillary: 170 mg/dL — ABNORMAL HIGH (ref 65–99)

## 2017-05-27 LAB — RENAL FUNCTION PANEL
ANION GAP: 19 — AB (ref 5–15)
Albumin: 2.7 g/dL — ABNORMAL LOW (ref 3.5–5.0)
BUN: 106 mg/dL — ABNORMAL HIGH (ref 6–20)
CHLORIDE: 95 mmol/L — AB (ref 101–111)
CO2: 21 mmol/L — AB (ref 22–32)
Calcium: 8.8 mg/dL — ABNORMAL LOW (ref 8.9–10.3)
Creatinine, Ser: 6.45 mg/dL — ABNORMAL HIGH (ref 0.61–1.24)
GFR calc non Af Amer: 9 mL/min — ABNORMAL LOW (ref >60)
GFR, EST AFRICAN AMERICAN: 11 mL/min — AB (ref >60)
GLUCOSE: 322 mg/dL — AB (ref 65–99)
Phosphorus: 6.8 mg/dL — ABNORMAL HIGH (ref 2.5–4.6)
Potassium: 3.2 mmol/L — ABNORMAL LOW (ref 3.5–5.1)
SODIUM: 135 mmol/L (ref 135–145)

## 2017-05-27 LAB — CBC
HEMATOCRIT: 28.4 % — AB (ref 39.0–52.0)
HEMOGLOBIN: 9.1 g/dL — AB (ref 13.0–17.0)
MCH: 27 pg (ref 26.0–34.0)
MCHC: 32 g/dL (ref 30.0–36.0)
MCV: 84.3 fL (ref 78.0–100.0)
Platelets: 572 10*3/uL — ABNORMAL HIGH (ref 150–400)
RBC: 3.37 MIL/uL — AB (ref 4.22–5.81)
RDW: 22.2 % — ABNORMAL HIGH (ref 11.5–15.5)
WBC: 22.1 10*3/uL — ABNORMAL HIGH (ref 4.0–10.5)

## 2017-05-27 MED ORDER — INSULIN GLARGINE 100 UNIT/ML ~~LOC~~ SOLN: 40.0000 [IU] | Freq: Every day | SUBCUTANEOUS | Status: DC

## 2017-05-27 MED ORDER — INSULIN ASPART 100 UNIT/ML ~~LOC~~ SOLN
10.0000 [IU] | Freq: Three times a day (TID) | SUBCUTANEOUS | Status: DC
  Administered 2017-05-27 – 2017-06-03 (×17): 10 [IU] via SUBCUTANEOUS

## 2017-05-27 MED ORDER — SIMETHICONE 80 MG PO CHEW
80.0000 mg | CHEWABLE_TABLET | Freq: Four times a day (QID) | ORAL | Status: DC
  Administered 2017-05-27 – 2017-06-03 (×28): 80 mg via ORAL
  Filled 2017-05-27 (×28): qty 1

## 2017-05-27 MED ORDER — SODIUM CHLORIDE 0.9 % IV SOLN
125.0000 mg | INTRAVENOUS | Status: DC
  Administered 2017-05-27 – 2017-06-03 (×3): 125 mg via INTRAVENOUS
  Filled 2017-05-27 (×6): qty 10

## 2017-05-27 MED ORDER — HEPARIN SODIUM (PORCINE) 1000 UNIT/ML DIALYSIS: 20.0000 [IU]/kg | INTRAMUSCULAR | Status: DC | PRN

## 2017-05-27 MED ORDER — INSULIN GLARGINE 100 UNIT/ML ~~LOC~~ SOLN
35.0000 [IU] | Freq: Every day | SUBCUTANEOUS | Status: DC
  Administered 2017-05-27 – 2017-05-30 (×4): 35 [IU] via SUBCUTANEOUS
  Filled 2017-05-27 (×4): qty 0.35

## 2017-05-27 MED ORDER — CALCIUM ACETATE (PHOS BINDER) 667 MG PO CAPS
1334.0000 mg | ORAL_CAPSULE | Freq: Three times a day (TID) | ORAL | Status: DC
  Administered 2017-05-27 – 2017-06-03 (×21): 1334 mg via ORAL
  Filled 2017-05-27 (×23): qty 2

## 2017-05-27 NOTE — Progress Notes (Signed)
Social Work Patient ID: John Bailey, male   DOB: 1970-07-24, 47 y.o.   MRN: [id number]  Met with pt to discuss team conference progression toward his goals and the discharge still 3/21. He is making good progress in therapies now his pain is more managed in his hands and feet. He wants to get as high level as possible before going home. His wife will be there in the evenings and church members all throughout the day. Therapy team to get equipment needs to worker, [facility] already set up for home health follow up.

## 2017-05-27 NOTE — Progress Notes (Signed)
[facility] PHYSICAL MEDICINE & REHABILITATION     PROGRESS NOTE  Subjective/Complaints:  Patient seen sitting up in bed this morning. He states he slept well overnight. He complains of gas. He is happy with his progress.  ROS: +Gas. Denies CP, SOB, nausea, vomiting, diarrhea.  Objective: Vital Signs: Blood pressure 133/80, pulse 75, temperature 97.8 F (36.6 C), temperature source Oral, resp. rate 18, height 6' (1.829 m), weight 119.4 kg (263 lb 3.7 oz), SpO2 94 %. No results found. Recent Labs    05/25/17 1439  WBC 23.7*  HGB 8.9*  HCT 28.0*  PLT 630*   Recent Labs    05/25/17 1439  NA 133*  K 3.6  CL 96*  GLUCOSE 171*  BUN 125*  CREATININE 6.87*  CALCIUM 8.7*   CBG (last 3)  Recent Labs    05/26/17 1648 05/26/17 2042 05/27/17 0647  GLUCAP 304* 294* 197*    Wt Readings from Last 3 Encounters:  05/27/17 119.4 kg (263 lb 3.7 oz)  05/13/17 132.1 kg (291 lb 3.6 oz)  04/02/17 (!) 154.9 kg (341 lb 9.6 oz)    Physical Exam:  BP 133/80 (BP Location: Right Arm)   Pulse 75   Temp 97.8 F (36.6 C) (Oral)   Resp 18   Ht 6' (1.829 m)   Wt 119.4 kg (263 lb 3.7 oz)   SpO2 94%   BMI 35.70 kg/m  Constitutional: Obese male. NAD.  HENT: Normocephalic. Atraumatic. Oral: Blisters with ulceration noted on lips and tongue, resolved Eyes:EOMare normal. No discharge.  Cardiovascular: RRR. No JVD. Respiratory:Effort normal and breath sounds normal.  GI: Bowel sounds are normal. Nondistended.  Musc:  Bouchard's deformities  +Edema b/l LE Neurological: Alert and oriented.  Motor: Right upper extremity 4-/5 proximal to distal (pain inhibition, stable) Left upper extremity: Shoulder abduction, elbow flexion/extension 3-4-/5, hand grip 2-/5 (pain inhibition, stable) Bilateral lower extremities: 3+-4-/5 proximal to distal (some pain inhibition, gradually improving)  Sensation is intact to light touch bilateral upper and lower limbs Skin. LE ischemic changes.    Assessment/Plan: 1. Functional deficits secondary to debility which require 3+ hours per day of interdisciplinary therapy in a comprehensive inpatient rehab setting. Physiatrist is providing close team supervision and 24 hour management of active medical problems listed below. Physiatrist and rehab team continue to assess barriers to discharge/monitor patient progress toward functional and medical goals.  Function:  Bathing Bathing position   Position: Sitting EOB  Bathing parts Body parts bathed by patient: Right arm, Left arm, Chest, Abdomen, Front perineal area, Right upper leg, Left upper leg, Right lower leg, Left lower leg Body parts bathed by helper: Buttocks, Back  Bathing assist Assist Level: 2 helpers(MOD A; 2 helpers sit to stand for buttock hygiene)      Upper Body Dressing/Undressing Upper body dressing   What is the patient wearing?: Pull over shirt/dress     Pull over shirt/dress - Perfomed by patient: Thread/unthread right sleeve, Thread/unthread left sleeve, Put head through opening Pull over shirt/dress - Perfomed by helper: Pull shirt over trunk        Upper body assist Assist Level: Supervision or verbal cues      Lower Body Dressing/Undressing Lower body dressing Lower body dressing/undressing activity did not occur: N/A What is the patient wearing?: Underwear, Pants, Shoes Underwear - Performed by patient: Thread/unthread right underwear leg, Thread/unthread left underwear leg Underwear - Performed by helper: Pull underwear up/down Pants- Performed by patient: Thread/unthread left pants leg, Thread/unthread right pants leg  Pants- Performed by helper: Pull pants up/down         Shoes - Performed by patient: Don/doff right shoe, Don/doff left shoe Shoes - Performed by helper: Don/doff right shoe, Don/doff left shoe(slippers)          Lower body assist Assist for lower body dressing: 2 Helpers      Toileting Toileting Toileting activity did not  occur: No continent bowel/bladder event Toileting steps completed by patient: Adjust clothing prior to toileting, Adjust clothing after toileting Toileting steps completed by helper: Performs perineal hygiene    Toileting assist Assist level: More than reasonable time   Transfers Chair/bed transfer Chair/bed transfer activity did not occur: Refused Chair/bed transfer method: Stand pivot Chair/bed transfer assist level: Moderate assist (Pt 50 - 74%/lift or lower) Chair/bed transfer assistive device: Armrests, Walker Mechanical lift: Ecologist Ambulation activity did not occur: Refused   Max distance: 200' Assist level: Touching or steadying assistance (Pt > 75%)   Wheelchair Wheelchair activity did not occur: Refused Type: Manual Max wheelchair distance: 150' Assist Level: Supervision or verbal cues  Cognition Comprehension Comprehension assist level: Follows complex conversation/direction with no assist  Expression Expression assist level: Expresses complex ideas: With no assist  Social Interaction Social Interaction assist level: Interacts appropriately with others with medication or extra time (anti-anxiety, antidepressant).  Problem Solving Problem solving assist level: Solves complex 90% of the time/cues < 10% of the time  Memory Memory assist level: Complete Independence: No helper    Medical Problem List and Plan: 1.Debilitysecondary to cardiorenal syndrome, synovitis left hand due to RA   Cont CIR    2. DVT Prophylaxis/Anticoagulation: Eliquis. Monitor for any bleeding episodes 3. Pain Management:Neurontin 300 mg daily, Robaxin as needed   Voltaren gel added for joint pain.   Neuropathic pain Left hand - cont gabapentin, started on capsasin cream .025%   Lidoderm patch added on 3/5   Controlled on 3/14 4. Mood:Provide emotional support 5. Neuropsych: This patientiscapable of making decisions on hisown behalf. 6. Skin/Wound Care:Routine  skin checks 7. Fluids/Electrolytes/Nutrition:Routine I&O's 8.End-stage renal disease. Hemodialysis initiated Tuesday Thursday Saturday schedule. Patient with left AVFgraft placed 05/10/2017   Appreciate nephro recs. 9.Acute on chronic anemia. Continue Aranesp.    Hb 8.9 on 3/12   Cont to monitor 10.Steroid induced hyperglycemia on Diabetes mellitus with peripheral neuropathy. Latest hemoglobin A1c 9.2. Diabetic teaching   Lantus insulin 25 units daily at bedtime, increased to 30 on 3/8, increased to 35 on 3/14, will need to decrease with taper of steroids   NovoLog 3 units 3 times a day, increased to 6 on 3/4, increased to 10 on 3/14.    Monitor with increased mobility 11.Acute on chronic diastolic congestive heart failure. Monitor for any signs of fluid overload. Continue Coreg 6.25mg  BID  Filed Weights   05/25/17 1915 05/26/17 0410 05/27/17 0611  Weight: 118.6 kg (261 lb 7.5 oz) 119.2 kg (262 lb 12.6 oz) 119.4 kg (263 lb 3.7 oz)     Unreliable 12.Rheumatoid arthritis. Prednisone 10 mg daily. Patient had been receiving Abataceptevery Tuesday prior to admission. Added Voltaren gel. Follow-up rheumatologist Dr.Ziokowska   MRI right knee reviewed, showing OA, meniscal tear and patellar tendinosis on right.  Per Ortho, braces PRN   DMARD resumed on 3/5    Started on high-dose 6 week steroid taper on 3/6  13.Atrial fibrillation RVR. Status post cardioversion. Amiodarone decreased to 200 mg daily 3/3 14.Leukocytosis.     Low-grade fever: Resolved  WBCs 23.7 on 3/13   Hand films reviewed, suggesting RA changes   Continue to monitor   Cont to monitor 15.  Left hand numbness   Likely related to fistula placement +/- RA 16. Labile blood pressure  Controlled on 3/14 17. 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 Constipation with gas   Bowel regimen increased on 3/7   Simethicone started on 3/14  LOS (Days) 14 A FACE TO FACE EVALUATION WAS  PERFORMED  John Bailey Lorie Phenix 05/27/2017 8:28 AM

## 2017-05-27 NOTE — Progress Notes (Signed)
Physical Therapy Session Note  Patient Details  Name: John Bailey MRN: [id number] Date of Birth: [birth year]  Today's Date: 05/27/2017 PT Individual Time: 1045-1200 PT Individual Time Calculation (min): 75 min   Short Term Goals: Week 2:  PT Short Term Goal 1 (Week 2): Pt will initiate gait training w/ LRAD PT Short Term Goal 2 (Week 2): Pt will transfer sit<>stand max assist x1 using LRAD PT Short Term Goal 3 (Week 2): Pt will transfer bed<>chair w/ max assist x1 using LRAD PT Short Term Goal 4 (Week 2): Pt will demonstrate scooting and adjusting in w/c for pressure relief and comfort w/ mod assist PT Short Term Goal 5 (Week 2): Pt will maintain static stance w/ BUE support on RW w/ min assist for 30 sec at a time  Skilled Therapeutic Interventions/Progress Updates:    no c/o pain but grimaces with R ankle movement and with pressure on buttock wound.  Session focus on stretching and functional transfers.    Pt requires mod/max assist for sit<>stand from w/c and from therapy mat to PRW, min assist for stand/pivot.  PT provided PROM stretching 3x30-45 second intervals for pec stretch, shoulder IR/ER stretch rhomboid stretch, lat stretch (painful so ceased after 1 rep), upper trap stretch, hamstring stretch, heel cord stretch, ant tib stretch, and superficial foot muscles for pain control.  Pt returnd to room in w/c at end of session and positioned in bed with mod assist for LEs.  Call bell in reach and needs met.   Therapy Documentation Precautions:  Precautions Precautions: Fall Precaution Comments: Severe RA in shoulders, left hand Required Braces or Orthoses: Other Brace/Splint Other Brace/Splint: R ankle brace Restrictions Weight Bearing Restrictions: No   See Function Navigator for Current Functional Status.   Therapy/Group: Individual Therapy  Michel Santee 05/27/2017, 12:08 PM

## 2017-05-27 NOTE — Progress Notes (Signed)
Physical Therapy Session Note  Patient Details  Name: John Bailey MRN: [id number] Date of Birth: 01-12-1971  Today's Date: 05/27/2017 PT Individual Time: 0805-0900 PT Individual Time Calculation (min): 55 min   Short Term Goals: Week 2:  PT Short Term Goal 1 (Week 2): Pt will initiate gait training w/ LRAD PT Short Term Goal 2 (Week 2): Pt will transfer sit<>stand max assist x1 using LRAD PT Short Term Goal 3 (Week 2): Pt will transfer bed<>chair w/ max assist x1 using LRAD PT Short Term Goal 4 (Week 2): Pt will demonstrate scooting and adjusting in w/c for pressure relief and comfort w/ mod assist PT Short Term Goal 5 (Week 2): Pt will maintain static stance w/ BUE support on RW w/ min assist for 30 sec at a time  Skilled Therapeutic Interventions/Progress Updates:    PT provided total assist for donning of R ankle brace and shoes as pt reports he is unable to reach yet and not willing to attempt at this time. Supervision to come to EOB using hospital bed features (HOB elevated and bed rails) exiting to the L. Pt requested to perform BLE LAQ and "stretching" prior to leaving room for session to warm-up and then performed w/c mobility in hallway with RUE and BLE for general strengthening and endurance. Rest of session focused on functional transfers with L PFRW throughout session with focus on technique and cues for foot and hand placement (mod to max assist needed except min assist when highly elevated mat table), dynamic gait through obstacle course to simulate home environment including navigating turns and stepping over simulated threshold using L PFRW and cues for technique and management of RW during threshold navigation, and dynamic standing balance activities for alternating kicking of ball in standing and then in standing throwing and catching ball with BUE's requiring overall min assist for balance and demonstrating good control of R knee throughout. Rest breaks needed during session  due to fatigue.   Therapy Documentation Precautions:  Precautions Precautions: Fall Precaution Comments: Severe RA in shoulders, left hand Required Braces or Orthoses: Other Brace/Splint Other Brace/Splint: R ankle brace Restrictions Weight Bearing Restrictions: No   Pain:  No complaints of pain.    See Function Navigator for Current Functional Status.   Therapy/Group: Individual Therapy  Canary Brim Ivory Broad, PT, DPT  05/27/2017, 9:26 AM

## 2017-05-27 NOTE — Progress Notes (Signed)
[facility] Rounding Note  Background: PMH systolic and diastolic HF EF 90%, DM2, HLD, RA, known CKD (Dr. Moshe Cipro). Adm 04/2017 massive volume overload. HD initiated for renal failure, fluid.   L RC AVF 05/10/17. CLIPPED to [place] TTS vs [place] MWF. On rehab d/t severe deconditioning.     Assessment/Plan:  1. New ESRD 2/2 DM/HTN. 1st HD inpt 04/28/17. Weight down sig.  Still vol xs. 4L off last couple of TMT's. Appears controversy re outpt schedule. Maybe TTS at [facility] (but wants MWF) vs MWF at [facility]. Lowest post weight so far 118.6 kg.  Still has some edema. BP tolerating well. Next HD on Thursday.  EDW not yet established.  Discharge slated for 3/21- Thursday need to clarify OP schedule prior to discharge 2. Anemia Darbe 100 QTuesday with HD. Last dosed 3/5.  ^ d dose to 150- unclear if iron given - will start with HD 3. HPTH calcitriol QOD - changed to HD TTS. PTH 230 on 05/11/17- - phoslo with meals- increased to 2 with meals 4. Malnutrition - renal vit/suppls- albumin improving 5. DM controlled 6. Afib on amio/apixaban 7. Obesity 8. DJD 9. RA - pred taper + topicals 10. Neuropathy 11. Leukocytosis WBC remains up-  Monitoring. No ATB's.  Mayley Lish A 05/23/2017, 2:37 PM   Subjective:  Seen on HD- sleepy Current assignment is TTS [place] (not MWF as he thought - and wants)- now is saying will go to [place] on MWF ?? Will clarify prior to discharge  So current inpt schedule OK and conducive to more rehab   Objective: Vital signs in last 24 hours: Temp:  [97.7 F (36.5 C)-97.8 F (36.6 C)] 97.8 F (36.6 C) (03/14 0611) Pulse Rate:  [75-87] 75 (03/14 0611) Resp:  [16-18] 18 (03/14 0611) BP: (108-133)/(64-80) 133/80 (03/14 0611) SpO2:  [94 %-98 %] 94 % (03/14 0611) Weight:  [119.4 kg (263 lb 3.7 oz)] 119.4 kg (263 lb 3.7 oz) (03/14 0611) Weight change: -3.9 kg (-9.6 oz)  Pastor in the room with him Great spirits NAD  Ant clear lung fields S1S2 No S3 + 2/6 murmur  LSB Chronic skin changes LE's 2+ pitting edema Legs dyesethetic Compression glove on left hand L AVF + bruit (2/25) R IJ TDC (2/19) dry dressing in place   Recent Labs    05/25/17 1439  NA 133*  K 3.6  CL 96*  CO2 19*  GLUCOSE 171*  BUN 125*  CREATININE 6.87*  CALCIUM 8.7*  PHOS 6.4*   Recent Labs    05/25/17 1439  ALBUMIN 2.7*   Recent Labs    05/25/17 1439  WBC 23.7*  HGB 8.9*  HCT 28.0*  MCV 83.6  PLT 630*   Medications . Abatacept  125 mg Subcutaneous Q Tue  . amiodarone  200 mg Oral Daily  . apixaban  5 mg Oral BID  . calcitRIOL  0.5 mcg Oral Q T,Th,Sa-HD  . calcium acetate  667 mg Oral TID WC  . capsaicin   Topical BID  . darbepoetin (ARANESP) injection - DIALYSIS  150 mcg Intravenous Q Tue-HD  . feeding supplement (NEPRO CARB STEADY)  237 mL Oral BID BM  . feeding supplement (PRO-STAT SUGAR FREE 64)  30 mL Oral BID  . fluticasone  2 spray Each Nare Daily  . gabapentin  300 mg Oral Daily  . Gerhardt's butt cream   Topical BID  . insulin aspart  0-15 Units Subcutaneous TID WC  . insulin aspart  10 Units Subcutaneous  TID WC  . insulin glargine  35 Units Subcutaneous QHS  . lidocaine  3 patch Transdermal Q24H  . multivitamin  1 tablet Oral QHS  . predniSONE  50 mg Oral Q breakfast   Followed by  . [START ON 06/02/2017] predniSONE  40 mg Oral Q breakfast   Followed by  . [START ON 06/09/2017] predniSONE  30 mg Oral Q breakfast   Followed by  . [START ON 06/16/2017] predniSONE  20 mg Oral Q breakfast   Followed by  . [START ON 06/23/2017] predniSONE  10 mg Oral Q breakfast  . senna-docusate  2 tablet Oral QHS  . simethicone  80 mg Oral QID

## 2017-05-27 NOTE — Procedures (Signed)
I was present at this session.  I have reviewed the session itself and made appropriate changes.  Hd via R IJ PC.  Tol well. Discussed HT  Mauricia Area 3/14/20195:50 PM

## 2017-05-27 NOTE — Progress Notes (Signed)
Occupational Therapy Session Note  Patient Details  Name: John Bailey MRN: [id number] Date of Birth: January 09, 1971  Today's Date: 05/27/2017 OT Individual Time: 0900-1000 OT Individual Time Calculation (min): 60 min    Short Term Goals: Week 1:  OT Short Term Goal 1 (Week 1): Pt will sit to stand wiht 1 caregiver wiht LRAD to decrease caregiver burden OT Short Term Goal 1 - Progress (Week 1): Not met OT Short Term Goal 2 (Week 1): Pt will transfer to w/c wiht MAX A of 1 caregiver in prep for [facility] transfer OT Short Term Goal 2 - Progress (Week 1): Not met OT Short Term Goal 3 (Week 1): Pt will complete clohting management prior to toileting with MOD A for standing balance OT Short Term Goal 3 - Progress (Week 1): Not met OT Short Term Goal 4 (Week 1): Pt will brush teeth without red foam handle to demonstrate improved gross grasp OT Short Term Goal 4 - Progress (Week 1): Not met Week 2:  OT Short Term Goal 1 (Week 2): Pt will complete sit to stand with max assist in preparation for LB selfcare tasks.  OT Short Term Goal 2 (Week 2): Pt will complete sliding board transfer with max assist from bed to wheelchair or drop arm commode. OT Short Term Goal 3 (Week 2): Pt will donn pullup shorts with total assist +2 (pt 40%) sit to stand.   OT Short Term Goal 4 (Week 2): Pt will complete LB bathing with total assist +2 (pt 40%) sit to stand.    Skilled Therapeutic Interventions/Progress Updates:    1:1 Pt received in w/c. Focus on sit to stands and standing tolerance at sink to perform 2 grooming tasks.  Applied heat to hands.  Transitioned to the gym to perform sit to stands and stand pivot transfers with platform RW with lifting A (mod A). After prolonged resting break performed sit to stand and then stepping up on a step and back off with bilateral Ue support. With min A 10 times. Pt then practiced stepping up on it backwards and coming off it forwards with min A but only able to perform it 5  times. Prolonged seated rest break in between. Mod A sit to stand from mat to walk back into w/c. Left sitting up in the w/c with elevated LE rests.    Therapy Documentation Precautions:  Precautions Precautions: Fall Precaution Comments: Severe RA in shoulders, left hand Required Braces or Orthoses: Other Brace/Splint Other Brace/Splint: R ankle brace Restrictions Weight Bearing Restrictions: No General:   Bailey Signs: Therapy Vitals Temp: (!) 97.5 F (36.4 C) Temp Source: Oral Pulse Rate: 93 Resp: 18 BP: (!) 142/85 Patient Position (if appropriate): Lying Oxygen Therapy SpO2: 96 % O2 Device: Room Air Pain: Pain Assessment Pain Assessment: No/denies pain  See Function Navigator for Current Functional Status.   Therapy/Group: Individual Therapy  Willeen Cass [facility] 05/27/2017, 3:15 PM

## 2017-05-28 ENCOUNTER — Encounter (HOSPITAL_BASED_OUTPATIENT_CLINIC_OR_DEPARTMENT_OTHER): Payer: BLUE CROSS/BLUE SHIELD

## 2017-05-28 ENCOUNTER — Inpatient Hospital Stay (HOSPITAL_COMMUNITY): Payer: BLUE CROSS/BLUE SHIELD | Admitting: Physical Therapy

## 2017-05-28 ENCOUNTER — Inpatient Hospital Stay (HOSPITAL_COMMUNITY): Payer: BLUE CROSS/BLUE SHIELD | Admitting: Occupational Therapy

## 2017-05-28 LAB — GLUCOSE, CAPILLARY
GLUCOSE-CAPILLARY: 96 mg/dL (ref 65–99)
GLUCOSE-CAPILLARY: 228 mg/dL — AB (ref 65–99)
Glucose-Capillary: 133 mg/dL — ABNORMAL HIGH (ref 65–99)
Glucose-Capillary: 69 mg/dL (ref 65–99)
Glucose-Capillary: 395 mg/dL — ABNORMAL HIGH (ref 65–99)

## 2017-05-28 MED ORDER — COLLAGENASE 250 UNIT/GM EX OINT
TOPICAL_OINTMENT | Freq: Every day | CUTANEOUS | Status: DC
  Administered 2017-05-28 – 2017-06-03 (×7): via TOPICAL
  Filled 2017-05-28 (×2): qty 30

## 2017-05-28 NOTE — Significant Event (Signed)
Hypoglycemic Event  CBG: 69      Treatment: 15 GM carbohydrate snack  Symptoms: None  Follow-up CBG: Time:1208 CBG Result: 96  Possible Reasons for Event: Inadequate meal intake  Comments/MD notified: PA Dan notified     John Bailey

## 2017-05-28 NOTE — Consult Note (Addendum)
[facility] Nurse wound consult note Reason for Consult: Unstageable wounds to sacral/coccyx area and right buttock. Wound type: Unstageable pressure injury Pressure Injury POA: No Measurement: Sacral/coccyx wound measures 7 cm x 2 cm; right buttock wound measures 4 cm x 2 cm.  Both have yellow slough and can benefit from the use of Santyl to debride the sites. No drainage or odor is detected. There are additional scattered areas of tissue impairment that are 100% pink, clean, and healing.  These should be fine to cover with a foam dressing and monitor for deterioration. Periwound: Skin that is normal in color, texture, and consistency other than what is described. Dressing procedure/placement/frequency: Santyl with saline moistened gauze to yellow slough areas.  Cover with foam dressing.  Change daily. Placement of an air mattress was discussed with the patient, and he does not want one at this time. Thank you for the consult.  Discussed plan of care with the patient. Val Riles, RN, MSN, CWOCN, CNS-BC, pager [phone]

## 2017-05-28 NOTE — Progress Notes (Signed)
Physical Therapy Session Note  Patient Details  Name: John Bailey MRN: [id number] Date of Birth: April 19, 1970  Today's Date: 05/28/2017 PT Individual Time: 1110-1145 PT Individual Time Calculation (min): 35 min   Short Term Goals: Week 2:  PT Short Term Goal 1 (Week 2): Pt will initiate gait training w/ LRAD PT Short Term Goal 2 (Week 2): Pt will transfer sit<>stand max assist x1 using LRAD PT Short Term Goal 3 (Week 2): Pt will transfer bed<>chair w/ max assist x1 using LRAD PT Short Term Goal 4 (Week 2): Pt will demonstrate scooting and adjusting in w/c for pressure relief and comfort w/ mod assist PT Short Term Goal 5 (Week 2): Pt will maintain static stance w/ BUE support on RW w/ min assist for 30 sec at a time  Skilled Therapeutic Interventions/Progress Updates:    Wound care nurse in room providing care at beginning of therapy session. Pt side-lying in bed, reports feeling dizzy and nauseated but no reports of pain. Per RN his BP has been low this AM. BP in supine 108/59. Pt agreeable to sit up. Side-lying to sit with SBA with use of bedrails and HOB elevated. BP 118/65 in sitting. Pt reports continuation of dizziness and nausea in sitting. Encouraged pt to eat some crackers while sitting EOB. Pt declines to participate in any standing activity or other therapeutic activity secondary to symptoms. Pt left seated EOB set up for lunch, needs in reach, friends at bedside. Pt missed 25 minutes of skilled therapy session due to wound care nursing and pt feeling ill.  Therapy Documentation Precautions:  Precautions Precautions: Fall Precaution Comments: Severe RA in shoulders, left hand Required Braces or Orthoses: Other Brace/Splint Other Brace/Splint: R ankle brace Restrictions Weight Bearing Restrictions: No General: PT Amount of Missed Time (min): 25 Minutes PT Missed Treatment Reason: Patient ill (Comment);Nursing care(low BP, nausea)  See Function Navigator for Current  Functional Status.   Therapy/Group: Individual Therapy  Excell Seltzer, PT, DPT  05/28/2017, 12:48 PM

## 2017-05-28 NOTE — Progress Notes (Signed)
Occupational Therapy Weekly Progress Note  Patient Details  Name: John Bailey MRN: [id number] Date of Birth: 1970/08/01  Beginning of progress report period: May 22, 2017 End of progress report period: May 28, 2017  Today's Date: 05/28/2017 OT Individual Time: 2010-0712 OT Individual Time Calculation (min): 79 min    Patient has met 4 of 4 short term goals.  Pt is making steady progress with OT.  He is able to complete LB bathing and dressing sit to stand with mod assist consistently and is approaching min assist.  He can also complete toilet transfers and toileitng with mod assist as well using the RW with platform on the left side and use of a wide 3:1.  His pain has been reduced dramatically, resulting in his increase in independence.  He still demonstrates significant impairment with left hand function secondary to the RA but can use it as an active assist for grooming and bathing tasks with supervision.  Feel his progress has improved so much that he has met most of his downgraded goals, and now they will need to be updated to an overall supervision/min assist level.  Anticipated discharge is expected to be 3/21.  Feel pt will benefit from continued CIR to reach these updated goals at the stated time level.   Patient continues to demonstrate the following deficits: muscle weakness and decreased sitting balance and decreased balance strategies and therefore will continue to benefit from skilled OT intervention to enhance overall performance with BADL and Reduce care partner burden.  Patient goals upgraded secondary to outstanding progress.  Plan of care revisions: goal updates.  OT Short Term Goals Week 3:  OT Short Term Goal 1 (Week 3): Pt will continue working on updated LTGs set at min assist.   Skilled Therapeutic Interventions/Progress Updates:    Pt completed transfer from supine to sit EOB with supervision using rails and HOB elevated to 30 degrees.  He was then able to donn  his shoes with setup and complete functional mobility to the toilet with mod assist for initial sit to stand from the EOB.  He completed clothing management to remove underpants and pants with min assist and then completed toilet hygiene with min assist as well.  He then transferred to the wheelchair at the sink for bathing and dressing.  He completed UB bathing with supervision and no AE.  The same for donning deodorant and pullover shirt.  Nursing made aware of wounds on buttocks and cream and dressing applied by nursing.  Min assist for sit to stand from the wheelchair to pull underpants and pants over hips.  He needed increased assist at the very back to pull them up.  Supervision for grooming tasks at the sink from wheelchair level.  Pt reported not feeling well in sitting.  First check it was 90/51 and then 100/64 on second attempt 10 mins later.  Pt left in wheelchair at bedside with call button and phone in reach.    Therapy Documentation Precautions:  Precautions Precautions: Fall Precaution Comments: Severe RA in shoulders, left hand Required Braces or Orthoses: Other Brace/Splint Other Brace/Splint: R ankle brace Restrictions Weight Bearing Restrictions: No  Pain: Pain Assessment Pain Assessment: Faces Faces Pain Scale: Hurts a little bit Pain Type: Chronic pain Pain Location: Foot Pain Orientation: Right;Left Pain Onset: With Activity Pain Intervention(s): Repositioned;Emotional support ADL:  See Function Navigator for Current Functional Status.   Therapy/Group: Individual Therapy  John Bailey OTR/L  05/28/2017, 12:25 PM

## 2017-05-28 NOTE — Progress Notes (Signed)
[place] PHYSICAL MEDICINE & REHABILITATION     PROGRESS NOTE  Subjective/Complaints:  Slept a little better last noc, no sig hand or knee pain  ROS: Denies CP, SOB, nausea, vomiting, diarrhea.  Objective: Vital Signs: Blood pressure (!) 142/75, pulse 83, temperature 98.5 F (36.9 C), temperature source Oral, resp. rate 20, height 6' (1.829 m), weight 120.1 kg (264 lb 12.4 oz), SpO2 98 %. No results found. Recent Labs    05/25/17 1439 05/27/17 1400  WBC 23.7* 22.1*  HGB 8.9* 9.1*  HCT 28.0* 28.4*  PLT 630* 572*   Recent Labs    05/25/17 1439 05/27/17 1400  NA 133* 135  K 3.6 3.2*  CL 96* 95*  GLUCOSE 171* 322*  BUN 125* 106*  CREATININE 6.87* 6.45*  CALCIUM 8.7* 8.8*   CBG (last 3)  Recent Labs    05/27/17 1813 05/27/17 2134 05/28/17 0641  GLUCAP 173* 170* 133*    Wt Readings from Last 3 Encounters:  05/28/17 120.1 kg (264 lb 12.4 oz)  05/13/17 132.1 kg (291 lb 3.6 oz)  04/02/17 (!) 154.9 kg (341 lb 9.6 oz)    Physical Exam:  BP (!) 142/75 (BP Location: Right Arm)   Pulse 83   Temp 98.5 F (36.9 C) (Oral)   Resp 20   Ht 6' (1.829 m)   Wt 120.1 kg (264 lb 12.4 oz)   SpO2 98%   BMI 35.91 kg/m  Constitutional: Obese male. NAD.  HENT: Normocephalic. Atraumatic. Oral: Blisters with ulceration noted on lips and tongue, resolved Eyes:EOMare normal. No discharge.  Cardiovascular: RRR. No JVD. Respiratory:Effort normal and breath sounds normal.  GI: Bowel sounds are normal. Nondistended.  Musc:  Bouchard's deformities  +Edema b/l LE Neurological: Alert and oriented.  Motor: Right upper extremity 4-/5 proximal to distal (pain inhibition, stable) Left upper extremity: Shoulder abduction, elbow flexion/extension 3-4-/5, hand grip 2-/5 (pain inhibition, stable) Bilateral lower extremities: 3+-4-/5 proximal to distal (some pain inhibition, gradually improving)  Sensation is intact to light touch bilateral upper and lower limbs Skin. LE ischemic  changes.   Assessment/Plan: 1. Functional deficits secondary to debility which require 3+ hours per day of interdisciplinary therapy in a comprehensive inpatient rehab setting. Physiatrist is providing close team supervision and 24 hour management of active medical problems listed below. Physiatrist and rehab team continue to assess barriers to discharge/monitor patient progress toward functional and medical goals.  Function:  Bathing Bathing position   Position: Sitting EOB  Bathing parts Body parts bathed by patient: Right arm, Left arm, Chest, Abdomen, Front perineal area, Right upper leg, Left upper leg, Right lower leg, Left lower leg Body parts bathed by helper: Buttocks, Back  Bathing assist Assist Level: 2 helpers(MOD A; 2 helpers sit to stand for buttock hygiene)      Upper Body Dressing/Undressing Upper body dressing   What is the patient wearing?: Pull over shirt/dress     Pull over shirt/dress - Perfomed by patient: Thread/unthread right sleeve, Thread/unthread left sleeve, Put head through opening Pull over shirt/dress - Perfomed by helper: Pull shirt over trunk        Upper body assist Assist Level: Supervision or verbal cues      Lower Body Dressing/Undressing Lower body dressing Lower body dressing/undressing activity did not occur: N/A What is the patient wearing?: Underwear, Pants, Shoes Underwear - Performed by patient: Thread/unthread right underwear leg, Thread/unthread left underwear leg Underwear - Performed by helper: Pull underwear up/down Pants- Performed by patient: Thread/unthread left pants leg,  Thread/unthread right pants leg Pants- Performed by helper: Pull pants up/down         Shoes - Performed by patient: Don/doff right shoe, Don/doff left shoe Shoes - Performed by helper: Don/doff right shoe, Don/doff left shoe          Lower body assist Assist for lower body dressing: Touching or steadying assistance (Pt > 75%)       Toileting Toileting Toileting activity did not occur: No continent bowel/bladder event Toileting steps completed by patient: Adjust clothing prior to toileting, Adjust clothing after toileting Toileting steps completed by helper: Performs perineal hygiene    Toileting assist Assist level: More than reasonable time   Transfers Chair/bed transfer Chair/bed transfer activity did not occur: Refused Chair/bed transfer method: Stand pivot Chair/bed transfer assist level: Touching or steadying assistance (Pt > 75%) Chair/bed transfer assistive device: Armrests, Walker Mechanical lift: Ecologist Ambulation activity did not occur: Refused   Max distance: 150' Assist level: Touching or steadying assistance (Pt > 75%)   Wheelchair Wheelchair activity did not occur: Refused Type: Manual Max wheelchair distance: 150' Assist Level: Supervision or verbal cues  Cognition Comprehension Comprehension assist level: Follows complex conversation/direction with extra time/assistive device  Expression Expression assist level: Expresses complex 90% of the time/cues < 10% of the time  Social Interaction Social Interaction assist level: Interacts appropriately 90% of the time - Needs monitoring or encouragement for participation or interaction.  Problem Solving Problem solving assist level: Solves basic 90% of the time/requires cueing < 10% of the time  Memory Memory assist level: Recognizes or recalls 90% of the time/requires cueing < 10% of the time    Medical Problem List and Plan: 1.Debilitysecondary to cardiorenal syndrome, synovitis left hand due to RA   Cont CIR    2. DVT Prophylaxis/Anticoagulation: Eliquis. Monitor for any bleeding episodes 3. Pain Management:Neurontin 300 mg daily, Robaxin as needed   Voltaren gel added for joint pain.   Neuropathic pain Left hand - cont gabapentin, started on capsasin cream .025%   Lidoderm patch added on 3/5   Controlled on 3/15-  better since prednisone started 4. Mood:Provide emotional support 5. Neuropsych: This patientiscapable of making decisions on hisown behalf. 6. Skin/Wound Care:Routine skin checks 7. Fluids/Electrolytes/Nutrition:Routine I&O's 8.End-stage renal disease. Hemodialysis initiated Tuesday Thursday Saturday schedule. Patient with left AVFgraft placed 05/10/2017   Appreciate nephro recs. 9.Acute on chronic anemia. Continue Aranesp.    Hb 8.9 on 3/12   Cont to monitor 10.Steroid induced hyperglycemia on Diabetes mellitus with peripheral neuropathy. Latest hemoglobin A1c 9.2. Diabetic teaching   Lantus insulin 25 units daily at bedtime, increased to 30 on 3/8, increased to 35 on 3/14, will need to decrease with taper of steroids   NovoLog 3 units 3 times a day, increased to 6 on 3/4, increased to 10 on 3/14.     CBG (last 3)  Recent Labs    05/27/17 1813 05/27/17 2134 05/28/17 0641  GLUCAP 173* 170* 133*   11.Acute on chronic diastolic congestive heart failure. Monitor for any signs of fluid overload. Continue Coreg 6.25mg  BID  Filed Weights   05/27/17 1324 05/27/17 1736 05/28/17 0342  Weight: 122.8 kg (270 lb 11.6 oz) 119.2 kg (262 lb 12.6 oz) 120.1 kg (264 lb 12.4 oz)     Unreliable wt, no SOB, LE swelling is trace/1+  pretib 12.Rheumatoid arthritis. Prednisone 10 mg daily. Patient had been receiving Abataceptevery Tuesday prior to admission. Added Voltaren gel. Follow-up rheumatologist Dr.Ziokowska  MRI right knee reviewed, showing OA, meniscal tear and patellar tendinosis on right.  Per Ortho, braces PRN   DMARD resumed on 3/5    Started on high-dose 6 week steroid taper on 3/6  13.Atrial fibrillation RVR. Status post cardioversion. Amiodarone decreased to 200 mg daily 3/3 14.Leukocytosis.     Low-grade fever: Resolved   WBCs 23.7 on 3/13, 22K on 3/14   Hand films reviewed, suggesting RA changes   Continue to monitor   Cont to monitor 15.  Left hand numbness    Likely related to fistula placement +/- RA 16. Labile blood pressure   Vitals:   05/27/17 1736 05/28/17 0342  BP: 125/76 (!) 142/75  Pulse: 73 83  Resp: 18 20  Temp: 97.6 F (36.4 C) 98.5 F (36.9 C)  SpO2: 96% 98%  Controlled 3/15 17. 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 Constipation with gas   Bowel regimen increased on 3/7   Simethicone started on 3/14  LOS (Days) 15 A FACE TO FACE EVALUATION WAS PERFORMED  Charlett Blake 05/28/2017 8:45 AM

## 2017-05-28 NOTE — Progress Notes (Signed)
Physical Therapy Session Note  Patient Details  Name: John Bailey MRN: [id number] Date of Birth: 10-04-1970  Today's Date: 05/28/2017 PT Individual Time: 1300-1345 PT Individual Time Calculation (min): 45 min   Short Term Goals: Week 2:  PT Short Term Goal 1 (Week 2): Pt will initiate gait training w/ LRAD PT Short Term Goal 2 (Week 2): Pt will transfer sit<>stand max assist x1 using LRAD PT Short Term Goal 3 (Week 2): Pt will transfer bed<>chair w/ max assist x1 using LRAD PT Short Term Goal 4 (Week 2): Pt will demonstrate scooting and adjusting in w/c for pressure relief and comfort w/ mod assist PT Short Term Goal 5 (Week 2): Pt will maintain static stance w/ BUE support on RW w/ min assist for 30 sec at a time  Skilled Therapeutic Interventions/Progress Updates:   Pt received supine in bed, asleep. Aroused easily and reports continued dizziness, but no Nausea.  PT agreeable to attempt bed level PT    PT instructed pt in supine BLE therex  Bridge with 3 sec hold x8 SLRx 10 BLE Heel slides, hip adduction/abduction x 12 BLE Ankle PF with DF stretch x 30 PT instructed pt in PNF contract relax HS stetch 20sec x 3 with 6 sec isometric contraction between bouts.   Pt noted to require multiple prolonged rest breaks due to fatigue and repeated fell asleep between and during therex. Pt request to rest due to significant fatigue and increased lethargy. PT will re-attempt treatment at later time.  Pt left in bed with call bell in reach and all needs met.           Therapy Documentation Precautions:  Precautions Precautions: Fall Precaution Comments: Severe RA in shoulders, left hand Required Braces or Orthoses: Other Brace/Splint Other Brace/Splint: R ankle brace Restrictions Weight Bearing Restrictions: No General: PT Amount of Missed Time (min): 30 Minutes PT Missed Treatment Reason: Patient ill (Comment);Nursing care(low BP, nausea) Pain: Pain Assessment Pain  Assessment: Faces Faces Pain Scale: Hurts a little bit Pain Type: Chronic pain Pain Location: Foot Pain Orientation: Right;Left Pain Onset: With Activity Pain Intervention(s): Repositioned;Emotional support   See Function Navigator for Current Functional Status.   Therapy/Group: Individual Therapy  Lorie Phenix 05/28/2017, 1:24 PM

## 2017-05-28 NOTE — Progress Notes (Signed)
[facility] Rounding Note  Background: PMH systolic and diastolic HF EF 36%, DM2, HLD, RA, known CKD (Dr. Moshe Cipro). Adm 04/2017 massive volume overload. HD initiated for renal failure, fluid.   L RC AVF 05/10/17. CLIPPED to [place] TTS vs [place] MWF. On rehab d/t severe deconditioning.     Assessment/Plan:  1. New ESRD 2/2 DM/HTN. 1st HD inpt 04/28/17. Weight down sig.  Still vol xs. 3-4L off last couple of TMT's. Appears controversy re outpt schedule. Maybe TTS at [facility] (but wants MWF) vs MWF at [facility]. Lowest post weight so far 118.6 kg.  Still has some edema. BP tolerating well. Next HD on Saturday.  EDW not yet established.  Discharge slated for 3/21- Thursday need to clarify OP schedule prior to discharge 2. Anemia Darbe 100 QTuesday with HD. Last dosed 3/5.  ^ d dose to 150- getting iron  with HD 3. HPTH calcitriol QOD - changed to HD TTS. PTH 230 on 05/11/17- - phoslo with meals- increased to 2 with meals 4. Malnutrition - renal vit/suppls- albumin improving 5. DM controlled 6. Afib on amio/apixaban 7. HTN/vol-  Still trying to get to EDW but closer- no BP meds but on amio, neurontin, robaxin and oxy PRN- may have something to do with dizziness otherwise we may have reached EDW  8. DJD 9. RA - pred taper + topicals 10. Neuropathy 11. Leukocytosis WBC remains up-  Monitoring. No ATB's.  John Bailey A 05/23/2017, 2:37 PM   Subjective: HD yest- only able to pull 3500 due to hypotension Dizzy this AM Current assignment is TTS [place] (not MWF as he thought - and wants)- now is saying will go to [place] on MWF ?? Will clarify prior to discharge  So current inpt schedule OK and conducive to more rehab   Objective: Vital signs in last 24 hours: Temp:  [97.6 F (36.4 C)-98.5 F (36.9 C)] 98.5 F (36.9 C) (03/15 0342) Pulse Rate:  [70-98] 83 (03/15 0342) Resp:  [18-20] 20 (03/15 0342) BP: (94-146)/(48-85) 142/75 (03/15 0342) SpO2:  [96 %-98 %] 98 % (03/15 0342) Weight:  [119.2 kg  (262 lb 12.6 oz)-120.1 kg (264 lb 12.4 oz)] 120.1 kg (264 lb 12.4 oz) (03/15 0342) Weight change: 3.4 kg (7 lb 7.9 oz)  Pastor in the room with him Great spirits NAD  Ant clear lung fields S1S2 No S3 + 2/6 murmur LSB Chronic skin changes LE's 2+ pitting edema Legs dyesethetic Compression glove on left hand L AVF + bruit (2/25) R IJ TDC (2/19) dry dressing in place   Recent Labs    05/25/17 1439 05/27/17 1400  NA 133* 135  K 3.6 3.2*  CL 96* 95*  CO2 19* 21*  GLUCOSE 171* 322*  BUN 125* 106*  CREATININE 6.87* 6.45*  CALCIUM 8.7* 8.8*  PHOS 6.4* 6.8*   Recent Labs    05/25/17 1439 05/27/17 1400  ALBUMIN 2.7* 2.7*   Recent Labs    05/25/17 1439 05/27/17 1400  WBC 23.7* 22.1*  HGB 8.9* 9.1*  HCT 28.0* 28.4*  MCV 83.6 84.3  PLT 630* 572*   Medications . Abatacept  125 mg Subcutaneous Q Tue  . amiodarone  200 mg Oral Daily  . apixaban  5 mg Oral BID  . calcitRIOL  0.5 mcg Oral Q T,Th,Sa-HD  . calcium acetate  1,334 mg Oral TID WC  . capsaicin   Topical BID  . collagenase   Topical Daily  . darbepoetin (ARANESP) injection - DIALYSIS  150 mcg  Intravenous Q Tue-HD  . feeding supplement (NEPRO CARB STEADY)  237 mL Oral BID BM  . feeding supplement (PRO-STAT SUGAR FREE 64)  30 mL Oral BID  . fluticasone  2 spray Each Nare Daily  . gabapentin  300 mg Oral Daily  . Gerhardt's butt cream   Topical BID  . insulin aspart  0-15 Units Subcutaneous TID WC  . insulin aspart  10 Units Subcutaneous TID WC  . insulin glargine  35 Units Subcutaneous QHS  . lidocaine  3 patch Transdermal Q24H  . multivitamin  1 tablet Oral QHS  . predniSONE  50 mg Oral Q breakfast   Followed by  . [START ON 06/02/2017] predniSONE  40 mg Oral Q breakfast   Followed by  . [START ON 06/09/2017] predniSONE  30 mg Oral Q breakfast   Followed by  . [START ON 06/16/2017] predniSONE  20 mg Oral Q breakfast   Followed by  . [START ON 06/23/2017] predniSONE  10 mg Oral Q breakfast  . senna-docusate  2  tablet Oral QHS  . simethicone  80 mg Oral QID   . ferric gluconate (FERRLECIT/NULECIT) IV Stopped (05/27/17 1849)

## 2017-05-29 ENCOUNTER — Inpatient Hospital Stay (HOSPITAL_COMMUNITY): Payer: BLUE CROSS/BLUE SHIELD | Admitting: Occupational Therapy

## 2017-05-29 LAB — RENAL FUNCTION PANEL
Albumin: 2.5 g/dL — ABNORMAL LOW (ref 3.5–5.0)
Anion gap: 16 — ABNORMAL HIGH (ref 5–15)
BUN: 103 mg/dL — AB (ref 6–20)
CALCIUM: 8.6 mg/dL — AB (ref 8.9–10.3)
CO2: 22 mmol/L (ref 22–32)
CREATININE: 6.52 mg/dL — AB (ref 0.61–1.24)
Chloride: 94 mmol/L — ABNORMAL LOW (ref 101–111)
GFR calc non Af Amer: 9 mL/min — ABNORMAL LOW (ref >60)
GFR, EST AFRICAN AMERICAN: 11 mL/min — AB (ref >60)
Glucose, Bld: 135 mg/dL — ABNORMAL HIGH (ref 65–99)
Phosphorus: 5.1 mg/dL — ABNORMAL HIGH (ref 2.5–4.6)
Potassium: 3.2 mmol/L — ABNORMAL LOW (ref 3.5–5.1)
SODIUM: 132 mmol/L — AB (ref 135–145)

## 2017-05-29 LAB — CBC
HCT: 27.4 % — ABNORMAL LOW (ref 39.0–52.0)
Hemoglobin: 8.8 g/dL — ABNORMAL LOW (ref 13.0–17.0)
MCH: 27.5 pg (ref 26.0–34.0)
MCHC: 32.1 g/dL (ref 30.0–36.0)
MCV: 85.6 fL (ref 78.0–100.0)
PLATELETS: 508 10*3/uL — AB (ref 150–400)
RBC: 3.2 MIL/uL — AB (ref 4.22–5.81)
RDW: 22.8 % — AB (ref 11.5–15.5)
WBC: 22.4 10*3/uL — ABNORMAL HIGH (ref 4.0–10.5)

## 2017-05-29 LAB — GLUCOSE, CAPILLARY
GLUCOSE-CAPILLARY: 111 mg/dL — AB (ref 65–99)
GLUCOSE-CAPILLARY: 61 mg/dL — AB (ref 65–99)
GLUCOSE-CAPILLARY: 134 mg/dL — AB (ref 65–99)
Glucose-Capillary: 378 mg/dL — ABNORMAL HIGH (ref 65–99)
Glucose-Capillary: 274 mg/dL — ABNORMAL HIGH (ref 65–99)

## 2017-05-29 MED ORDER — HEPARIN SODIUM (PORCINE) 1000 UNIT/ML DIALYSIS
20.0000 [IU]/kg | INTRAMUSCULAR | Status: DC | PRN
  Administered 2017-05-29: 2400 [IU] via INTRAVENOUS_CENTRAL
  Filled 2017-05-29 (×2): qty 3

## 2017-05-29 NOTE — Progress Notes (Signed)
[place] PHYSICAL MEDICINE & REHABILITATION     PROGRESS NOTE  Subjective/Complaints:  Patient states he felt dizzy yesterday while with therapy.  Blood pressures were lower as well.  Feels much better this morning.  ROS: Patient denies fever, rash, sore throat, blurred vision, nausea, vomiting, diarrhea, cough, shortness of breath or chest pain, joint or back pain, headache, or mood change.   Objective: Vital Signs: Blood pressure 122/87, pulse 96, temperature 98.6 F (37 C), temperature source Oral, resp. rate 18, height 6' (1.829 m), weight 120.6 kg (265 lb 14 oz), SpO2 100 %. No results found. Recent Labs    05/27/17 1400  WBC 22.1*  HGB 9.1*  HCT 28.4*  PLT 572*   Recent Labs    05/27/17 1400  NA 135  K 3.2*  CL 95*  GLUCOSE 322*  BUN 106*  CREATININE 6.45*  CALCIUM 8.8*   CBG (last 3)  Recent Labs    05/28/17 1643 05/28/17 2042 05/29/17 0643  GLUCAP 228* 395* 378*    Wt Readings from Last 3 Encounters:  05/29/17 120.6 kg (265 lb 14 oz)  05/13/17 132.1 kg (291 lb 3.6 oz)  04/02/17 (!) 154.9 kg (341 lb 9.6 oz)    Physical Exam:  BP 122/87 (BP Location: Right Arm)   Pulse 96   Temp 98.6 F (37 C) (Oral)   Resp 18   Ht 6' (1.829 m)   Wt 120.6 kg (265 lb 14 oz)   SpO2 100%   BMI 36.06 kg/m  Constitutional: No distress . Vital signs reviewed. obese HEENT: EOMI, oral membranes moist. Blisters resolved Cardiovascular: RRR without murmur. No JVD    Respiratory: CTA Bilaterally without wheezes or rales. Normal effort    GI: BS +, non-tender, non-distended  Musc:  Bouchard's deformities  +Edema b/l LE--stable tr to 1+ Neurological: Alert and oriented.  Motor: Right upper extremity 4-/5 proximal to distal (pain inhibition, stable) Left upper extremity: Shoulder abduction, elbow flexion/extension 3-4-/5, hand grip 2-/5 (no change) Bilateral lower extremities: 3+-4-/5 proximal to distal (some pain inhibition, gradually improving)  Sensation is intact to  light touch bilateral upper and lower limbs Skin. LE ischemic changes.   Assessment/Plan: 1. Functional deficits secondary to debility which require 3+ hours per day of interdisciplinary therapy in a comprehensive inpatient rehab setting. Physiatrist is providing close team supervision and 24 hour management of active medical problems listed below. Physiatrist and rehab team continue to assess barriers to discharge/monitor patient progress toward functional and medical goals.  Function:  Bathing Bathing position   Position: Sitting EOB  Bathing parts Body parts bathed by patient: Right arm, Left arm, Chest, Abdomen, Front perineal area, Right upper leg, Left upper leg, Right lower leg, Left lower leg Body parts bathed by helper: Buttocks, Back  Bathing assist Assist Level: 2 helpers(MOD A; 2 helpers sit to stand for buttock hygiene)      Upper Body Dressing/Undressing Upper body dressing   What is the patient wearing?: Pull over shirt/dress     Pull over shirt/dress - Perfomed by patient: Thread/unthread right sleeve, Thread/unthread left sleeve, Put head through opening Pull over shirt/dress - Perfomed by helper: Pull shirt over trunk        Upper body assist Assist Level: Supervision or verbal cues      Lower Body Dressing/Undressing Lower body dressing Lower body dressing/undressing activity did not occur: N/A What is the patient wearing?: Underwear, Pants, Shoes Underwear - Performed by patient: Thread/unthread right underwear leg, Thread/unthread left underwear  leg, Pull underwear up/down Underwear - Performed by helper: Pull underwear up/down Pants- Performed by patient: Thread/unthread left pants leg, Thread/unthread right pants leg, Pull pants up/down Pants- Performed by helper: Pull pants up/down         Shoes - Performed by patient: Don/doff right shoe, Don/doff left shoe Shoes - Performed by helper: Don/doff right shoe, Don/doff left shoe          Lower body  assist Assist for lower body dressing: Touching or steadying assistance (Pt > 75%)      Toileting Toileting Toileting activity did not occur: No continent bowel/bladder event Toileting steps completed by patient: Adjust clothing prior to toileting, Adjust clothing after toileting, Performs perineal hygiene Toileting steps completed by helper: Performs perineal hygiene    Toileting assist Assist level: Touching or steadying assistance (Pt.75%)   Transfers Chair/bed transfer Chair/bed transfer activity did not occur: Refused Chair/bed transfer method: Stand pivot Chair/bed transfer assist level: Touching or steadying assistance (Pt > 75%) Chair/bed transfer assistive device: Armrests, Walker Mechanical lift: Ecologist Ambulation activity did not occur: Refused   Max distance: 150' Assist level: Touching or steadying assistance (Pt > 75%)   Wheelchair Wheelchair activity did not occur: Refused Type: Manual Max wheelchair distance: 150' Assist Level: Supervision or verbal cues  Cognition Comprehension Comprehension assist level: Follows complex conversation/direction with extra time/assistive device  Expression Expression assist level: Expresses complex 90% of the time/cues < 10% of the time  Social Interaction Social Interaction assist level: Interacts appropriately 90% of the time - Needs monitoring or encouragement for participation or interaction.  Problem Solving Problem solving assist level: Solves basic 90% of the time/requires cueing < 10% of the time  Memory Memory assist level: Recognizes or recalls 90% of the time/requires cueing < 10% of the time    Medical Problem List and Plan: 1.Debilitysecondary to cardiorenal syndrome, synovitis left hand due to RA   Cont CIR    2. DVT Prophylaxis/Anticoagulation: Eliquis. Monitor for any bleeding episodes 3. Pain Management:Neurontin 300 mg daily, Robaxin as needed   Voltaren gel added for joint pain.    Neuropathic pain Left hand - cont gabapentin, started on capsasin cream .025%   Lidoderm patch added on 3/5   Controlled on 3/15- better since prednisone started 4. Mood:Provide emotional support 5. Neuropsych: This patientiscapable of making decisions on hisown behalf. 6. Skin/Wound Care:Routine skin checks 7. Fluids/Electrolytes/Nutrition:Routine I&O's 8.End-stage renal disease. Hemodialysis initiated Tuesday Thursday Saturday schedule. Patient with left AVFgraft placed 05/10/2017   Appreciate nephro recs. 9.Acute on chronic anemia. Continue Aranesp.    Hb 9.1 on 3/14   Cont to monitor 10.Steroid induced hyperglycemia on Diabetes mellitus with peripheral neuropathy. Latest hemoglobin A1c 9.2. Diabetic teaching   Lantus insulin 25 units daily at bedtime, increased to 30 on 3/8, increased to 35 on 3/14, will need to decrease with taper of steroids   NovoLog 3 units 3 times a day, increased to 6 on 3/4, increased to 10 on 3/14.    -Currently however her sugars are poorly controlled and labile--hesitate making scheduled changes until pattern is consistent.    -diet discussed CBG (last 3)  Recent Labs    05/28/17 1643 05/28/17 2042 05/29/17 0643  GLUCAP 228* 395* 378*   11.Acute on chronic diastolic congestive heart failure. Monitor for any signs of fluid overload. Continue Coreg 6.25mg  BID  Filed Weights   05/27/17 1736 05/28/17 0342 05/29/17 0313  Weight: 119.2 kg (262 lb 12.6 oz) 120.1 kg (  264 lb 12.4 oz) 120.6 kg (265 lb 14 oz)     Volume management per nephrology.  May be near dry weight. 12.Rheumatoid arthritis. Prednisone 10 mg daily. Patient had been receiving Abataceptevery Tuesday prior to admission. Added Voltaren gel. Follow-up rheumatologist Dr.Ziokowska   MRI right knee reviewed, showing OA, meniscal tear and patellar tendinosis on right.  Per Ortho, braces PRN   DMARD resumed on 3/5    Started on high-dose 6 week steroid taper on 3/6  13.Atrial  fibrillation RVR. Status post cardioversion. Amiodarone decreased to 200 mg daily 3/3 14.Leukocytosis.     Low-grade fever: Resolved   WBCs 23.7 on 3/13, 22K on 3/14--- likely steroid related   Hand films reviewed, suggesting RA changes   Continue to monitor   Cont to monitor 15.  Left hand numbness   Likely related to fistula placement +/- RA 16. Labile blood pressure   Vitals:   05/28/17 1415 05/29/17 0313  BP: 122/70 122/87  Pulse: 78 96  Resp: 18 18  Temp: 97.7 F (36.5 C) 98.6 F (37 C)  SpO2: 96% 100%  Controlled 3/16 17. 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 Constipation with gas   Bowel regimen increased on 3/7   Simethicone started on 3/14  LOS (Days) 16 A FACE TO FACE EVALUATION WAS PERFORMED  Meredith Staggers 05/29/2017 8:17 AM

## 2017-05-29 NOTE — Progress Notes (Incomplete Revision)
Occupational Therapy Session Note  Patient Details  Name: John Bailey MRN: [id number] Date of Birth: 06-06-1970  Today's Date: 05/29/2017 OT Individual Time: 3734-2876 OT Individual Treatment Time Calculation: 62 min    Skilled Therapeutic Interventions/Progress Updates:    Pt greeted supine in bed with visitor present. Reported that pain was manageable. Agreeable to tx. Tx focus on dynamic balance, activity tolerance, ADL retraining, and functional transfers. After OT donned Rt ankle brace, pt transitioned to EOB and donned footwear with Total A. Worked on sit<stands without use of RW, with cues for anterior weightshifting and maintaining upright alignment provided (as he wanted to spend time practicing this). Pt then ambulated short distance to sink with use of platform RW. He bathed while standing 90% of time with close supervision during dynamic activity. Also completed 50% of grooming tasks in standing and able to incorporate L UE functionally while completing oral care. Afterwards he completed UB/LB dressing without use of AE. Sit<stand with RW completed with Max A after 3 attempts to do so at sink (to lift LB garments over hips). Pt then donned shirt while standing with 1 small LOB and Min A from OT to correct. After he returned to w/c and was set up for lunch. Left him with all needs within reach.   Therapy Documentation Precautions:  Precautions Precautions: Fall Precaution Comments: Severe RA in shoulders, left hand Required Braces or Orthoses: Other Brace/Splint Other Brace/Splint: R ankle brace Restrictions Weight Bearing Restrictions: No  ADL:     See Function Navigator for Current Functional Status.   Therapy/Group: Individual Therapy  Marit Goodwill A [name] 05/29/2017, 7:46 AM

## 2017-05-29 NOTE — Progress Notes (Signed)
Occupational Therapy Session Note  Patient Details  Name: RAGNAR WAAS MRN: [id number] Date of Birth: 09-Feb-1971  Today's Date: 05/29/2017 OT Individual Time:  - 1751-0258 OT Individual Treatment Time Calculation: 62 min     Skilled Therapeutic Interventions/Progress Updates:    Pt greeted supine in bed with visitor present. Reported that pain was manageable. Agreeable to tx. Tx focus on dynamic balance, activity tolerance, ADL retraining, and functional transfers. After OT donned Rt AFO, pt transitioned to EOB and donned footwear with Total A. Worked on sit<stands without use of RW, with cues for anterior weightshifting and maintaining upright alignment provided. Pt ambulated short distance to sink with use of platform RW. He bathed while standing 90% of time with close supervision during dynamic activity. Also completed 50% of grooming tasks in standing. Able to incorporate L UE functionally while completing oral care. Afterwards he completed UB/LB dressing without use of AE. Sit<stand with RW completed with Max A and 3 attempts to lift LB garments over hips. Pt then donned shirt while standing with 1 small LOB and Min A from OT to correct. Pt then returned to w/c and was set up for lunch. Left him with all needs within reach.   Therapy Documentation Precautions:  Precautions Precautions: Fall Precaution Comments: Severe RA in shoulders, left hand Required Braces or Orthoses: Other Brace/Splint Other Brace/Splint: R ankle brace Restrictions Weight Bearing Restrictions: No ADL:    See Function Navigator for Current Functional Status.   Therapy/Group: Individual Therapy  Tandi Hanko A Lillia Lengel 05/29/2017, 7:46 AM

## 2017-05-29 NOTE — Progress Notes (Signed)
[facility] Rounding Note  Background: PMH systolic and diastolic HF EF 37%, DM2, HLD, RA, known CKD (Dr. Moshe Cipro). Adm 04/2017 massive volume overload. HD initiated for renal failure, fluid.   L RC AVF 05/10/17. CLIPPED to [place] [facility]. On rehab d/t severe deconditioning.     Assessment/Plan:  1. New ESRD 2/2 DM/HTN. 1st HD inpt 04/28/17. Weight down sig.  Still vol xs. 3-4L off last couple of TMT's.  TTS at [facility] (but wants MWF). Lowest post weight so far 118.6 kg.  Still has some edema. BP tolerating well. Next HD today.  EDW im saying is 118.  Discharge slated for 3/21- Thursday - will get HD prior to discharge 2. Anemia Darbe 100 QTuesday with HD. Last dosed 3/5.  ^ d dose to 150- getting iron  with HD 3. HPTH calcitriol QOD - changed to HD TTS. PTH 230 on 05/11/17- - phoslo  increased to 2 with meals 4. Malnutrition - renal vit/suppls- albumin improving 5. DM controlled 6. Afib on amio/apixaban 7. HTN/vol-  Still trying to get to EDW but closer- no BP meds but on amio, neurontin, robaxin and oxy PRN- may have something to do with dizziness otherwise we may have reached EDW - I am saying around 118 8. DJD 9. RA - pred taper + topicals 10. Neuropathy 11. Leukocytosis WBC remains up-  Monitoring. No ATB's.  Marda Breidenbach A 05/23/2017, 2:37 PM   Subjective: Dizzy this AM Current assignment is TTS [place] (not MWF as he thought - and wants)- So current inpt schedule OK and conducive to more rehab   Objective: Vital signs in last 24 hours: Temp:  [97.7 F (36.5 C)-98.6 F (37 C)] 98.6 F (37 C) (03/16 0313) Pulse Rate:  [78-96] 86 (03/16 0845) Resp:  [18] 18 (03/16 0313) BP: (122-127)/(70-87) 127/80 (03/16 0845) SpO2:  [96 %-100 %] 100 % (03/16 0313) Weight:  [120.6 kg (265 lb 14 oz)] 120.6 kg (265 lb 14 oz) (03/16 0313) Weight change: -2.2 kg (-13.6 oz)  Pastor in the room with him Great spirits NAD  Ant clear lung fields S1S2 No S3 + 2/6 murmur LSB Chronic skin  changes LE's less edema Legs dyesethetic Compression glove on left hand L AVF + bruit (2/25) R IJ TDC (2/19) dry dressing in place   Recent Labs    05/27/17 1400  NA 135  K 3.2*  CL 95*  CO2 21*  GLUCOSE 322*  BUN 106*  CREATININE 6.45*  CALCIUM 8.8*  PHOS 6.8*   Recent Labs    05/27/17 1400  ALBUMIN 2.7*   Recent Labs    05/27/17 1400  WBC 22.1*  HGB 9.1*  HCT 28.4*  MCV 84.3  PLT 572*   Medications . Abatacept  125 mg Subcutaneous Q Tue  . amiodarone  200 mg Oral Daily  . apixaban  5 mg Oral BID  . calcitRIOL  0.5 mcg Oral Q T,Th,Sa-HD  . calcium acetate  1,334 mg Oral TID WC  . capsaicin   Topical BID  . collagenase   Topical Daily  . darbepoetin (ARANESP) injection - DIALYSIS  150 mcg Intravenous Q Tue-HD  . feeding supplement (NEPRO CARB STEADY)  237 mL Oral BID BM  . feeding supplement (PRO-STAT SUGAR FREE 64)  30 mL Oral BID  . fluticasone  2 spray Each Nare Daily  . gabapentin  300 mg Oral Daily  . Gerhardt's butt cream   Topical BID  . insulin aspart  0-15 Units Subcutaneous TID  WC  . insulin aspart  10 Units Subcutaneous TID WC  . insulin glargine  35 Units Subcutaneous QHS  . lidocaine  3 patch Transdermal Q24H  . multivitamin  1 tablet Oral QHS  . predniSONE  50 mg Oral Q breakfast   Followed by  . [START ON 06/02/2017] predniSONE  40 mg Oral Q breakfast   Followed by  . [START ON 06/09/2017] predniSONE  30 mg Oral Q breakfast   Followed by  . [START ON 06/16/2017] predniSONE  20 mg Oral Q breakfast   Followed by  . [START ON 06/23/2017] predniSONE  10 mg Oral Q breakfast  . senna-docusate  2 tablet Oral QHS  . simethicone  80 mg Oral QID   . ferric gluconate (FERRLECIT/NULECIT) IV Stopped (05/27/17 1849)

## 2017-05-29 NOTE — Progress Notes (Signed)
Occupational Therapy Session Note  Patient Details  Name: ADIAN JABLONOWSKI MRN: [id number] Date of Birth: 08-Jul-1970  Today's Date: 05/29/2017 OT Individual Time: 9675-9163 OT Individual Treatment Time Calculation: 62 min    Skilled Therapeutic Interventions/Progress Updates:    Pt greeted supine in bed with visitor present. Reported that pain was manageable. Agreeable to tx. Tx focus on dynamic balance, activity tolerance, ADL retraining, and functional transfers. After OT donned Rt ankle brace, pt transitioned to EOB and donned footwear with Total A. Worked on sit<stands without use of RW, with cues for anterior weightshifting and maintaining upright alignment provided (as he wanted to spend time practicing this). Pt then ambulated short distance to sink with use of platform RW. He bathed while standing 90% of time with close supervision during dynamic activity. Also completed 50% of grooming tasks in standing and able to incorporate L UE functionally while completing oral care. Afterwards he completed UB/LB dressing without use of AE. Sit<stand with RW completed with Max A after 3 attempts to do so at sink (to lift LB garments over hips). Pt then donned shirt while standing with 1 small LOB and Min A from OT to correct. After he returned to w/c and was set up for lunch. Left him with all needs within reach.   Therapy Documentation Precautions:  Precautions Precautions: Fall Precaution Comments: Severe RA in shoulders, left hand Required Braces or Orthoses: Other Brace/Splint Other Brace/Splint: R ankle brace Restrictions Weight Bearing Restrictions: No  ADL:     See Function Navigator for Current Functional Status.   Therapy/Group: Individual Therapy  Ha Placeres A Ewald Beg 05/29/2017, 7:46 AM

## 2017-05-29 NOTE — Significant Event (Signed)
Hypoglycemic Event  CBG: 61  Treatment: 15 GM carbohydrate snack  Symptoms: None  Follow-up CBG: Time:1228 CBG Result:111  Possible Reasons for Event: Medication regimen: Insulin coverage  Comments/MD notified:Dr Hosie Spangle

## 2017-05-29 NOTE — Procedures (Signed)
Patient was seen on dialysis and the procedure was supervised.  BFR 400  Via PC BP is  120/73.   Patient appears to be tolerating treatment well  John Bailey A 05/29/2017

## 2017-05-30 ENCOUNTER — Inpatient Hospital Stay (HOSPITAL_COMMUNITY): Payer: BLUE CROSS/BLUE SHIELD | Admitting: Occupational Therapy

## 2017-05-30 ENCOUNTER — Inpatient Hospital Stay (HOSPITAL_COMMUNITY): Payer: BLUE CROSS/BLUE SHIELD

## 2017-05-30 LAB — GLUCOSE, CAPILLARY
GLUCOSE-CAPILLARY: 257 mg/dL — AB (ref 65–99)
Glucose-Capillary: 94 mg/dL (ref 65–99)
Glucose-Capillary: 200 mg/dL — ABNORMAL HIGH (ref 65–99)

## 2017-05-30 NOTE — Progress Notes (Signed)
Occupational Therapy Session Note  Patient Details  Name: PILAR CORRALES MRN: [id number] Date of Birth: [birth year]  Today's Date: 05/30/2017 OT Individual Time:  -   0800-0910   (70 min)       Short Term Goals: Week 1:  OT Short Term Goal 1 (Week 1): Pt will sit to stand wiht 1 caregiver wiht LRAD to decrease caregiver burden OT Short Term Goal 1 - Progress (Week 1): Not met OT Short Term Goal 2 (Week 1): Pt will transfer to w/c wiht MAX A of 1 caregiver in prep for [facility] transfer OT Short Term Goal 2 - Progress (Week 1): Not met OT Short Term Goal 3 (Week 1): Pt will complete clohting management prior to toileting with MOD A for standing balance OT Short Term Goal 3 - Progress (Week 1): Not met OT Short Term Goal 4 (Week 1): Pt will brush teeth without red foam handle to demonstrate improved gross grasp OT Short Term Goal 4 - Progress (Week 1): Not met Week 2:  OT Short Term Goal 1 (Week 2): Pt will complete sit to stand with max assist in preparation for LB selfcare tasks.  OT Short Term Goal 1 - Progress (Week 2): Met OT Short Term Goal 2 (Week 2): Pt will complete sliding board transfer with max assist from bed to wheelchair or drop arm commode. OT Short Term Goal 2 - Progress (Week 2): Met OT Short Term Goal 3 (Week 2): Pt will donn pullup shorts with total assist +2 (pt 40%) sit to stand.   OT Short Term Goal 3 - Progress (Week 2): Met OT Short Term Goal 4 (Week 2): Pt will complete LB bathing with total assist +2 (pt 40%) sit to stand.   OT Short Term Goal 4 - Progress (Week 2): Met Week 3:  OT Short Term Goal 1 (Week 3): Pt will continue working on updated LTGs set at min assist.   Skilled Therapeutic Interventions/Progress Updates:    Agreeable to tx. Tx focus on dynamic balance, activity tolerance, ADL retraining, and functional transfers. After OT donned Rt AFO, pt transitioned to EOB with mod I.  Pt donned shoes with set up.  Transitioned from sit to stand with RW in front  with min assist.  Ambulated to sink and then to toilet with min assist.  Pt transitioned to 3n1 over toilet with min assist and had BM.  Stood with min assist for peri care  And OT assisted with pulling up pants and performing cleaning.  Ambulated to sink with RW and CGA.  Sat in wc and performed grooming and remainder of UB dressing.       Therapy Documentation Precautions:  Precautions Precautions: Fall Precaution Comments: Severe RA in shoulders, left hand Required Braces or Orthoses: Other Brace/Splint Other Brace/Splint: R ankle brace Restrictions Weight Bearing Restrictions: No   Vital Signs: Therapy Vitals Temp: 98.6 F (37 C) Temp Source: Oral Pulse Rate: 87 Resp: 18 BP: (!) 135/95 Patient Position (if appropriate): Lying Oxygen Therapy SpO2: 100 % O2 Device: Room Air Pain:  3/10  general                   See Function Navigator for Current Functional Status.   Therapy/Group: Individual Therapy  Lisa Roca 05/30/2017, 8:57 AM

## 2017-05-30 NOTE — Progress Notes (Signed)
Physical Therapy Weekly Progress Note  Patient Details  Name: John Bailey MRN: [id number] Date of Birth: 10-11-1970  Beginning of progress report period: May 20, 2017 End of progress report period: May 30, 2017  Today's Date: 05/30/2017 PT Individual Time: 1635-1700 PT Individual Time Calculation (min): 25 min   Patient has met 5 of 5 short term goals.  Pt demonstrates improvements in all functional mobility. He is able to perform bed mobility with mod assist, sit<>stands with mod assist, transfers with min assist and platform RW, and he is ambulating up to 200 ft with min assist. Pt can be resistant to education from therapist including d/c recommendations, will continue working on d/c planning and family ed as pt will be discharging this week.   Patient continues to demonstrate the following deficits muscle weakness and muscle joint tightness, decreased cardiorespiratoy endurance and decreased standing balance and decreased balance strategies and therefore will continue to benefit from skilled PT intervention to increase functional independence with mobility.  Patient progressing toward long term goals..  Continue plan of care.  PT Short Term Goals Week 2:  PT Short Term Goal 1 (Week 2): Pt will initiate gait training w/ LRAD PT Short Term Goal 1 - Progress (Week 2): Met PT Short Term Goal 2 (Week 2): Pt will transfer sit<>stand max assist x1 using LRAD PT Short Term Goal 2 - Progress (Week 2): Met PT Short Term Goal 3 (Week 2): Pt will transfer bed<>chair w/ max assist x1 using LRAD PT Short Term Goal 3 - Progress (Week 2): Met PT Short Term Goal 4 (Week 2): Pt will demonstrate scooting and adjusting in w/c for pressure relief and comfort w/ mod assist PT Short Term Goal 4 - Progress (Week 2): Met PT Short Term Goal 5 (Week 2): Pt will maintain static stance w/ BUE support on RW w/ min assist for 30 sec at a time PT Short Term Goal 5 - Progress (Week 2): Met Week 3:  PT Short  Term Goal 1 (Week 3): STG=LTG  Skilled Therapeutic Interventions/Progress Updates:  Visual/perceptual remediation/compensation;Stair training;Disease management/prevention;Ambulation/gait training;Pain management;Patient/family education;Therapeutic Activities;Wheelchair propulsion/positioning;DME/adaptive equipment instruction;Balance/vestibular training;Cognitive remediation/compensation;Psychosocial support;Therapeutic Exercise;Community reintegration;Functional mobility training;Skin care/wound management;UE/LE Strength taining/ROM;UE/LE Coordination activities;Splinting/orthotics;Neuromuscular re-education;Discharge planning   Pt seated in w/c upon PT arrival, requested therapist come back at later time secondary to having company. Therapist returned in the afternoon, pt willing to work on ambulation this session. Pt performed sit<>stand from w/c with mod assist, pt used platform RW to ambulate x 200 ft and x 120 ft with min assist. Pt left standing at the sink in care of RN for dressing change.   Therapy Documentation Precautions:  Precautions Precautions: Fall Precaution Comments: Severe RA in shoulders, left hand Required Braces or Orthoses: Other Brace/Splint Other Brace/Splint: R ankle brace Restrictions Weight Bearing Restrictions: No   See Function Navigator for Current Functional Status.  Therapy/Group: Individual Therapy  Netta Corrigan, PT, DPT 05/30/2017, 4:54 PM

## 2017-05-30 NOTE — Progress Notes (Signed)
Pt has not had documented BM since 05/26/2017. Pt refused prn Sorbitol 3/16 and 3/17. Pt states " I had a large one this morning, it must not have been documented." Claude Manges, LPN

## 2017-05-30 NOTE — Plan of Care (Signed)
  RH SKIN INTEGRITY RH STG ABLE TO PERFORM INCISION/WOUND CARE W/ASSISTANCE Description STG Able To Perform Incision/Wound Care With mod Assistance.  05/30/2017 0240 - Progressing by Blinda Leatherwood, RN  Total care with wound care. Continue wound care as ordered and as needed.

## 2017-05-30 NOTE — Progress Notes (Signed)
PHYSICAL MEDICINE & REHABILITATION     PROGRESS NOTE  Subjective/Complaints:  Feeling better today.  Denies any dizziness.  Had a good night sleep.  ROS: Patient denies fever, rash, sore throat, blurred vision, nausea, vomiting, diarrhea, cough, shortness of breath or chest pain, joint or back pain, headache, or mood change.   Objective: Vital Signs: Blood pressure (!) 135/95, pulse 87, temperature 98.6 F (37 C), temperature source Oral, resp. rate 18, height 6' (1.829 m), weight 122.1 kg (269 lb 2.9 oz), SpO2 100 %. No results found. Recent Labs    05/27/17 1400 05/29/17 1355  WBC 22.1* 22.4*  HGB 9.1* 8.8*  HCT 28.4* 27.4*  PLT 572* 508*   Recent Labs    05/27/17 1400 05/29/17 1406  NA 135 132*  K 3.2* 3.2*  CL 95* 94*  GLUCOSE 322* 135*  BUN 106* 103*  CREATININE 6.45* 6.52*  CALCIUM 8.8* 8.6*   CBG (last 3)  Recent Labs    05/29/17 1228 05/29/17 1709 05/29/17 2056  GLUCAP 111* 134* 274*    Wt Readings from Last 3 Encounters:  05/30/17 122.1 kg (269 lb 2.9 oz)  05/13/17 132.1 kg (291 lb 3.6 oz)  04/02/17 (!) 154.9 kg (341 lb 9.6 oz)    Physical Exam:  BP (!) 135/95 (BP Location: Right Arm)   Pulse 87   Temp 98.6 F (37 C) (Oral)   Resp 18   Ht 6' (1.829 m)   Wt 122.1 kg (269 lb 2.9 oz)   SpO2 100%   BMI 36.51 kg/m  Constitutional: No distress . Vital signs reviewed. HEENT: EOMI, oral membranes moist Cardiovascular: RRR without murmur. No JVD    Respiratory: CTA Bilaterally without wheezes or rales. Normal effort    GI: BS +, non-tender, non-distended  Musc:  Bouchard's deformities  +Edema b/l LE--stable tr to 1+ Neurological: Alert and oriented.  Motor: Right upper extremity 4-/5 proximal to distal (stable exam) Left upper extremity: Shoulder abduction, elbow flexion/extension 3-4-/5, hand grip 2-/5 (no change) Bilateral lower extremities: 3+-4-/5 proximal to distal (some pain inhibition, gradually improving)  Sensation is intact  to light touch bilateral upper and lower limbs Skin.  Lower extremity ischemic changes.   Assessment/Plan: 1. Functional deficits secondary to debility which require 3+ hours per day of interdisciplinary therapy in a comprehensive inpatient rehab setting. Physiatrist is providing close team supervision and 24 hour management of active medical problems listed below. Physiatrist and rehab team continue to assess barriers to discharge/monitor patient progress toward functional and medical goals.  Function:  Bathing Bathing position   Position: Wheelchair/chair at sink  Bathing parts Body parts bathed by patient: Right arm, Left arm, Chest, Abdomen, Front perineal area, Right upper leg, Left upper leg, Right lower leg, Left lower leg, Buttocks Body parts bathed by helper: Back  Bathing assist Assist Level: Touching or steadying assistance(Pt > 75%)      Upper Body Dressing/Undressing Upper body dressing   What is the patient wearing?: Pull over shirt/dress     Pull over shirt/dress - Perfomed by patient: Thread/unthread right sleeve, Thread/unthread left sleeve, Put head through opening, Pull shirt over trunk Pull over shirt/dress - Perfomed by helper: Pull shirt over trunk        Upper body assist Assist Level: Touching or steadying assistance(Pt > 75%)(standing)      Lower Body Dressing/Undressing Lower body dressing Lower body dressing/undressing activity did not occur: N/A What is the patient wearing?: Underwear, Pants, Shoes, AFO Underwear - Performed  by patient: Thread/unthread right underwear leg, Thread/unthread left underwear leg, Pull underwear up/down Underwear - Performed by helper: Pull underwear up/down Pants- Performed by patient: Thread/unthread left pants leg, Thread/unthread right pants leg, Pull pants up/down Pants- Performed by helper: Pull pants up/down         Shoes - Performed by patient: Don/doff right shoe, Don/doff left shoe Shoes - Performed by  helper: Don/doff right shoe, Don/doff left shoe AFO - Performed by patient: Don/doff right AFO        Lower body assist Assist for lower body dressing: Touching or steadying assistance (Pt > 75%)      Toileting Toileting Toileting activity did not occur: No continent bowel/bladder event Toileting steps completed by patient: Adjust clothing prior to toileting, Adjust clothing after toileting, Performs perineal hygiene Toileting steps completed by helper: Performs perineal hygiene    Toileting assist Assist level: Touching or steadying assistance (Pt.75%)   Transfers Chair/bed transfer Chair/bed transfer activity did not occur: Refused Chair/bed transfer method: Stand pivot Chair/bed transfer assist level: Touching or steadying assistance (Pt > 75%) Chair/bed transfer assistive device: Armrests, Walker Mechanical lift: Ecologist Ambulation activity did not occur: Refused   Max distance: 150' Assist level: Touching or steadying assistance (Pt > 75%)   Wheelchair Wheelchair activity did not occur: Refused Type: Manual Max wheelchair distance: 150' Assist Level: Supervision or verbal cues  Cognition Comprehension Comprehension assist level: Follows complex conversation/direction with no assist  Expression Expression assist level: Expresses complex ideas: With extra time/assistive device  Social Interaction Social Interaction assist level: Interacts appropriately with others - No medications needed.  Problem Solving Problem solving assist level: Solves basic 90% of the time/requires cueing < 10% of the time  Memory Memory assist level: Complete Independence: No helper    Medical Problem List and Plan: 1.Debilitysecondary to cardiorenal syndrome, synovitis left hand due to RA   Cont CIR    2. DVT Prophylaxis/Anticoagulation: Eliquis. Monitor for any bleeding episodes 3. Pain Management:Neurontin 300 mg daily, Robaxin as needed   Voltaren gel added for  joint pain.   Neuropathic pain Left hand - cont gabapentin, started on capsasin cream .025%   Lidoderm patch added on 3/5   Controlled on 3/15- better since prednisone started 4. Mood:Provide emotional support 5. Neuropsych: This patientiscapable of making decisions on hisown behalf. 6. Skin/Wound Care:Routine skin checks 7. Fluids/Electrolytes/Nutrition:Routine I&O's 8.End-stage renal disease. Hemodialysis initiated Tuesday Thursday Saturday schedule. Patient with left AVFgraft placed 05/10/2017   Appreciate nephro recs. 9.Acute on chronic anemia. Continue Aranesp.    Hb 9.1 on 3/14   Cont to monitor 10.Steroid induced hyperglycemia on Diabetes mellitus with peripheral neuropathy. Latest hemoglobin A1c 9.2. Diabetic teaching   Lantus insulin 25 units daily at bedtime, increased to 30 on 3/8, increased to 35 on 3/14.  Steroids being tapered   NovoLog 3 units 3 times a day, increased to 6 on 3/4, increased to 10 on 3/14.    -Sugars seem to be increasing in the afternoon and evening hours.  Cover with sliding scale insulin as prednisone taper ongoing   -diet discussed CBG (last 3)  Recent Labs    05/29/17 1228 05/29/17 1709 05/29/17 2056  GLUCAP 111* 134* 274*   11.Acute on chronic diastolic congestive heart failure. Monitor for any signs of fluid overload. Continue Coreg 6.25mg  BID  Filed Weights   05/29/17 0313 05/29/17 1420 05/30/17 0549  Weight: 120.6 kg (265 lb 14 oz) 123.3 kg (271 lb 13.2 oz) 122.1 kg (  269 lb 2.9 oz)     Volume management per nephrology.    12.Rheumatoid arthritis. Prednisone 10 mg daily. Patient had been receiving Abataceptevery Tuesday prior to admission. Added Voltaren gel. Follow-up rheumatologist Dr.Ziokowska   MRI right knee reviewed, showing OA, meniscal tear and patellar tendinosis on right.  Per Ortho, braces PRN   DMARD resumed on 3/5    Started on high-dose 6 week steroid taper on 3/6  13.Atrial fibrillation RVR. Status post  cardioversion. Amiodarone decreased to 200 mg daily 3/3 14.Leukocytosis.     Low-grade fever: Resolved   WBCs 23.7 on 3/13, 22K on 3/14--- likely steroid related   Hand films reviewed, suggesting RA changes   Continue to monitor   Cont to monitor 15.  Left hand numbness   Likely related to fistula placement +/- RA 16. Labile blood pressure   Vitals:   05/29/17 1923 05/30/17 0549  BP: 124/77 (!) 135/95  Pulse: 77 87  Resp: 16 18  Temp: 98 F (36.7 C) 98.6 F (37 C)  SpO2: 100% 100%  Controlled 3/16 17. 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 Constipation with gas   Bowel regimen increased on 3/7   Simethicone started on 3/14  LOS (Days) 17 A FACE TO FACE EVALUATION WAS PERFORMED  John Bailey 05/30/2017 7:47 AM

## 2017-05-30 NOTE — Progress Notes (Signed)
[facility] Rounding Note  Background: PMH systolic and diastolic HF EF 60%, DM2, HLD, RA, known CKD (Dr. Moshe Cipro). Adm 04/2017 massive volume overload. HD initiated for renal failure, fluid.   L RC AVF 05/10/17. CLIPPED to [place] [facility]. On rehab d/t severe deconditioning.     Assessment/Plan:  1. New ESRD 2/2 DM/HTN. 1st HD inpt 04/28/17. Weight down sig.  Still vol xs. 3-4L off last couple of TMT's.  TTS at [facility] (but wants MWF). Lowest post weight so far 118.6 kg.  Still has some edema. BP tolerating well. Next HD today.  EDW im saying is 118.  Discharge slated for 3/21- Thursday - will get HD prior to discharge.  Pre HD BUNs have been high- will likely need more time but pt slightly obsessed with his time so fa- have told him we will do 4 hours and 15 on Tuesday 2. Anemia Darbe 100 QTuesday with HD. Last dosed 3/5.  ^ d dose to 150- getting iron  with HD 3. HPTH calcitriol QOD - changed to HD TTS. PTH 230 on 05/11/17- - phoslo  increased to 2 with meals 4. Malnutrition - renal vit/suppls- albumin improving 5. DM controlled 6. Afib on amio/apixaban 7. HTN/vol-  Still trying to get to EDW but closer- no BP meds but on amio, neurontin, robaxin and oxy PRN- may have something to do with dizziness otherwise we may have reached EDW - I am saying around 118.  Still having big gains in the hospital - has been discussed   8. RA - pred taper + topicals  9. Leukocytosis WBC remains up-  Monitoring. No ATB's.  Mikeila Burgen A 05/23/2017, 2:37 PM   Subjective: No new c/o's HD yest- removed 3300 tolerated well Current assignment is T[facility] [place] (not MWF as he thought - and wants)- So current inpt schedule OK and conducive to more rehab   Objective: Vital signs in last 24 hours: Temp:  [97.4 F (36.3 C)-98.6 F (37 C)] 98.6 F (37 C) (03/17 0549) Pulse Rate:  [71-92] 87 (03/17 0549) Resp:  [15-19] 18 (03/17 0549) BP: (93-135)/(48-95) 135/95 (03/17 0549) SpO2:  [99 %-100 %] 100 % (03/17  0549) Weight:  [122.1 kg (269 lb 2.9 oz)-123.3 kg (271 lb 13.2 oz)] 122.1 kg (269 lb 2.9 oz) (03/17 0549) Weight change: 2.7 kg (5 lb 15.2 oz)  Pastor in the room with him Great spirits NAD  Ant clear lung fields S1S2 No S3 + 2/6 murmur LSB Chronic skin changes LE's less edema Legs dyesethetic Compression glove on left hand L AVF + bruit (2/25) R IJ TDC (2/19) dry dressing in place   Recent Labs    05/27/17 1400 05/29/17 1406  NA 135 132*  K 3.2* 3.2*  CL 95* 94*  CO2 21* 22  GLUCOSE 322* 135*  BUN 106* 103*  CREATININE 6.45* 6.52*  CALCIUM 8.8* 8.6*  PHOS 6.8* 5.1*   Recent Labs    05/27/17 1400 05/29/17 1406  ALBUMIN 2.7* 2.5*   Recent Labs    05/27/17 1400 05/29/17 1355  WBC 22.1* 22.4*  HGB 9.1* 8.8*  HCT 28.4* 27.4*  MCV 84.3 85.6  PLT 572* 508*   Medications . Abatacept  125 mg Subcutaneous Q Tue  . amiodarone  200 mg Oral Daily  . apixaban  5 mg Oral BID  . calcitRIOL  0.5 mcg Oral Q T,Th,Sa-HD  . calcium acetate  1,334 mg Oral TID WC  . capsaicin   Topical BID  . collagenase  Topical Daily  . darbepoetin (ARANESP) injection - DIALYSIS  150 mcg Intravenous Q Tue-HD  . feeding supplement (NEPRO CARB STEADY)  237 mL Oral BID BM  . feeding supplement (PRO-STAT SUGAR FREE 64)  30 mL Oral BID  . fluticasone  2 spray Each Nare Daily  . gabapentin  300 mg Oral Daily  . Gerhardt's butt cream   Topical BID  . insulin aspart  0-15 Units Subcutaneous TID WC  . insulin aspart  10 Units Subcutaneous TID WC  . insulin glargine  35 Units Subcutaneous QHS  . lidocaine  3 patch Transdermal Q24H  . multivitamin  1 tablet Oral QHS  . predniSONE  50 mg Oral Q breakfast   Followed by  . [START ON 06/02/2017] predniSONE  40 mg Oral Q breakfast   Followed by  . [START ON 06/09/2017] predniSONE  30 mg Oral Q breakfast   Followed by  . [START ON 06/16/2017] predniSONE  20 mg Oral Q breakfast   Followed by  . [START ON 06/23/2017] predniSONE  10 mg Oral Q breakfast   . senna-docusate  2 tablet Oral QHS  . simethicone  80 mg Oral QID   . ferric gluconate (FERRLECIT/NULECIT) IV Stopped (05/29/17 1605)

## 2017-05-30 NOTE — Plan of Care (Signed)
  Progressing Consults RH GENERAL PATIENT EDUCATION Description See Patient Education module for education specifics. 05/30/2017 1316 - Progressing by Claude Manges, LPN Skin Care Protocol Initiated - if Braden Score 18 or less Description If consults are not indicated, leave blank or document N/A 05/30/2017 1316 - Progressing by Claude Manges, LPN Diabetes Guidelines if Diabetic/Glucose > 140 Description If diabetic or lab glucose is > 140 mg/dl - Initiate Diabetes/Hyperglycemia Guidelines & Document Interventions  05/30/2017 1316 - Progressing by Claude Manges, LPN RH SKIN INTEGRITY RH STG SKIN FREE OF INFECTION/BREAKDOWN Description Patients skin will remain free from further breakdown or infection with mod assist.  05/30/2017 1316 - Progressing by Claude Manges, LPN RH STG MAINTAIN SKIN INTEGRITY WITH ASSISTANCE Description STG Maintain Skin Integrity With mod Assistance.  05/30/2017 1316 - Progressing by Claude Manges, LPN RH STG ABLE TO PERFORM INCISION/WOUND CARE W/ASSISTANCE Description STG Able To Perform Incision/Wound Care With mod Assistance.  05/30/2017 1316 - Progressing by Claude Manges, LPN RH PAIN MANAGEMENT RH STG PAIN MANAGED AT OR BELOW PT'S PAIN GOAL Description < 5  05/30/2017 1316 - Progressing by Claude Manges, LPN

## 2017-05-31 ENCOUNTER — Inpatient Hospital Stay (HOSPITAL_COMMUNITY): Payer: BLUE CROSS/BLUE SHIELD | Admitting: Occupational Therapy

## 2017-05-31 ENCOUNTER — Inpatient Hospital Stay (HOSPITAL_COMMUNITY): Payer: BLUE CROSS/BLUE SHIELD

## 2017-05-31 LAB — GLUCOSE, CAPILLARY
GLUCOSE-CAPILLARY: 147 mg/dL — AB (ref 65–99)
GLUCOSE-CAPILLARY: 223 mg/dL — AB (ref 65–99)
Glucose-Capillary: 268 mg/dL — ABNORMAL HIGH (ref 65–99)
Glucose-Capillary: 390 mg/dL — ABNORMAL HIGH (ref 65–99)

## 2017-05-31 MED ORDER — INSULIN GLARGINE 100 UNIT/ML ~~LOC~~ SOLN
40.0000 [IU] | Freq: Every day | SUBCUTANEOUS | Status: DC
  Administered 2017-05-31: 40 [IU] via SUBCUTANEOUS
  Filled 2017-05-31: qty 0.4

## 2017-05-31 NOTE — Progress Notes (Signed)
Assessment/Plan:  1. New ESRD 2/2 DM/HTN.   TTS at [facility] (but wants MWF).  Poss DC 3/21. Next HD Tues 2. Anemia Darbe and iron 3. HPTH calcitriol QOD - and binders 4. Malnutrition - renal vit/suppls- albumin improving 5. DM controlled 6. Afib on amio/apixaban 7. HTN/vol-  Still trying establish an EDW  8. RA - pred taper + topicals   Subjective: Interval History: Weight up 1.6kg since yesterday  Objective: Vital signs in last 24 hours: Temp:  [97.6 F (36.4 C)] 97.6 F (36.4 C) (03/18 0348) Pulse Rate:  [87-95] 95 (03/18 0348) Resp:  [18] 18 (03/18 0348) BP: (132-152)/(83-92) 152/92 (03/18 0348) SpO2:  [98 %-100 %] 98 % (03/18 0348) Weight:  [122.7 kg (270 lb 8.1 oz)] 122.7 kg (270 lb 8.1 oz) (03/18 0500) Weight change: -0.6 kg (-5.2 oz)  Intake/Output from previous day: 03/17 0701 - 03/18 0700 In: 240 [P.O.:240] Out: -  Intake/Output this shift: Total I/O In: 120 [P.O.:120] Out: -   General appearance: alert and cooperative Resp: clear to auscultation bilaterally Cardio: regular rate and rhythm, S1, S2 normal, no murmur, click, rub or gallop GI: soft, non-tender; bowel sounds normal; no masses,  no organomegaly Extremities: edema tr to 1+  Lab Results: Recent Labs    05/29/17 1355  WBC 22.4*  HGB 8.8*  HCT 27.4*  PLT 508*   BMET:  Recent Labs    05/29/17 1406  NA 132*  K 3.2*  CL 94*  CO2 22  GLUCOSE 135*  BUN 103*  CREATININE 6.52*  CALCIUM 8.6*   No results for input(s): PTH in the last 72 hours. Iron Studies: No results for input(s): IRON, TIBC, TRANSFERRIN, FERRITIN in the last 72 hours. Studies/Results: No results found.  Scheduled: . Abatacept  125 mg Subcutaneous Q Tue  . amiodarone  200 mg Oral Daily  . apixaban  5 mg Oral BID  . calcitRIOL  0.5 mcg Oral Q T,Th,Sa-HD  . calcium acetate  1,334 mg Oral TID WC  . capsaicin   Topical BID  . collagenase   Topical Daily  . darbepoetin (ARANESP) injection - DIALYSIS  150 mcg  Intravenous Q Tue-HD  . feeding supplement (NEPRO CARB STEADY)  237 mL Oral BID BM  . feeding supplement (PRO-STAT SUGAR FREE 64)  30 mL Oral BID  . fluticasone  2 spray Each Nare Daily  . gabapentin  300 mg Oral Daily  . Gerhardt's butt cream   Topical BID  . insulin aspart  0-15 Units Subcutaneous TID WC  . insulin aspart  10 Units Subcutaneous TID WC  . insulin glargine  40 Units Subcutaneous QHS  . lidocaine  3 patch Transdermal Q24H  . multivitamin  1 tablet Oral QHS  . predniSONE  50 mg Oral Q breakfast   Followed by  . [START ON 06/02/2017] predniSONE  40 mg Oral Q breakfast   Followed by  . [START ON 06/09/2017] predniSONE  30 mg Oral Q breakfast   Followed by  . [START ON 06/16/2017] predniSONE  20 mg Oral Q breakfast   Followed by  . [START ON 06/23/2017] predniSONE  10 mg Oral Q breakfast  . senna-docusate  2 tablet Oral QHS  . simethicone  80 mg Oral QID    LOS: 18 days   Estanislado Emms 05/31/2017,10:57 AM

## 2017-05-31 NOTE — Progress Notes (Signed)
[place] PHYSICAL MEDICINE & REHABILITATION     PROGRESS NOTE  Subjective/Complaints:   No issues overnite  ROS: Patient denies fever, rash, sore throat, blurred vision, nausea, vomiting, diarrhea, cough, shortness of breath or chest pain, joint or back pain, headache, or mood change.   Objective: Vital Signs: Blood pressure (!) 152/92, pulse 95, temperature 97.6 F (36.4 C), temperature source Oral, resp. rate 18, height 6' (1.829 m), weight 122.7 kg (270 lb 8.1 oz), SpO2 98 %. No results found. Recent Labs    05/29/17 1355  WBC 22.4*  HGB 8.8*  HCT 27.4*  PLT 508*   Recent Labs    05/29/17 1406  NA 132*  K 3.2*  CL 94*  GLUCOSE 135*  BUN 103*  CREATININE 6.52*  CALCIUM 8.6*   CBG (last 3)  Recent Labs    05/30/17 1646 05/30/17 2053 05/31/17 0710  GLUCAP 200* 257* 268*    Wt Readings from Last 3 Encounters:  05/31/17 122.7 kg (270 lb 8.1 oz)  05/13/17 132.1 kg (291 lb 3.6 oz)  04/02/17 (!) 154.9 kg (341 lb 9.6 oz)    Physical Exam:  BP (!) 152/92 (BP Location: Right Arm)   Pulse 95   Temp 97.6 F (36.4 C) (Oral)   Resp 18   Ht 6' (1.829 m)   Wt 122.7 kg (270 lb 8.1 oz)   SpO2 98%   BMI 36.69 kg/m  Constitutional: No distress . Vital signs reviewed. HEENT: EOMI, oral membranes moist Cardiovascular: RRR without murmur. No JVD    Respiratory: CTA Bilaterally without wheezes or rales. Normal effort    GI: BS +, non-tender, non-distended  Musc:  Bouchard's deformities  +Edema b/l LE--stable tr to 1+ Neurological: Alert and oriented.  Motor: Right upper extremity 4-/5 proximal to distal (stable exam) Left upper extremity: Shoulder abduction, elbow flexion/extension 3-4-/5, hand grip 2-/5 (no change) Bilateral lower extremities: 3+-4-/5 proximal to distal (some pain inhibition, gradually improving)  Sensation is intact to light touch bilateral upper and lower limbs Skin.  Lower extremity ischemic changes.   Assessment/Plan: 1. Functional deficits  secondary to debility which require 3+ hours per day of interdisciplinary therapy in a comprehensive inpatient rehab setting. Physiatrist is providing close team supervision and 24 hour management of active medical problems listed below. Physiatrist and rehab team continue to assess barriers to discharge/monitor patient progress toward functional and medical goals.  Function:  Bathing Bathing position   Position: Standing at sink  Bathing parts Body parts bathed by patient: Right arm, Left arm, Chest, Abdomen, Front perineal area, Right upper leg, Left upper leg, Right lower leg, Left lower leg, Buttocks Body parts bathed by helper: Back  Bathing assist Assist Level: Touching or steadying assistance(Pt > 75%)      Upper Body Dressing/Undressing Upper body dressing   What is the patient wearing?: Pull over shirt/dress     Pull over shirt/dress - Perfomed by patient: Thread/unthread right sleeve, Thread/unthread left sleeve, Put head through opening Pull over shirt/dress - Perfomed by helper: Pull shirt over trunk        Upper body assist Assist Level: Touching or steadying assistance(Pt > 75%)      Lower Body Dressing/Undressing Lower body dressing Lower body dressing/undressing activity did not occur: N/A What is the patient wearing?: Underwear, Pants, Shoes, AFO Underwear - Performed by patient: Thread/unthread right underwear leg, Thread/unthread left underwear leg, Pull underwear up/down Underwear - Performed by helper: Pull underwear up/down Pants- Performed by patient: Thread/unthread left pants  leg, Thread/unthread right pants leg, Pull pants up/down Pants- Performed by helper: Pull pants up/down         Shoes - Performed by patient: Don/doff right shoe, Don/doff left shoe Shoes - Performed by helper: Don/doff right shoe, Don/doff left shoe AFO - Performed by patient: Don/doff right AFO AFO - Performed by helper: Don/doff right AFO      Lower body assist Assist for  lower body dressing: Touching or steadying assistance (Pt > 75%)      Toileting Toileting Toileting activity did not occur: No continent bowel/bladder event Toileting steps completed by patient: Adjust clothing prior to toileting, Adjust clothing after toileting, Performs perineal hygiene Toileting steps completed by helper: Adjust clothing after toileting, Adjust clothing prior to toileting    Toileting assist Assist level: Touching or steadying assistance (Pt.75%)   Transfers Chair/bed transfer Chair/bed transfer activity did not occur: Refused Chair/bed transfer method: Stand pivot Chair/bed transfer assist level: Set up only Chair/bed transfer assistive device: Armrests, Environmental manager lift: Ecologist Ambulation activity did not occur: Refused   Max distance: 200 ft Assist level: Touching or steadying assistance (Pt > 75%)   Wheelchair Wheelchair activity did not occur: Refused Type: Manual Max wheelchair distance: 150' Assist Level: Supervision or verbal cues  Cognition Comprehension Comprehension assist level: Follows complex conversation/direction with no assist  Expression Expression assist level: Expresses complex ideas: With extra time/assistive device  Social Interaction Social Interaction assist level: Interacts appropriately with others - No medications needed.  Problem Solving Problem solving assist level: Solves basic 90% of the time/requires cueing < 10% of the time  Memory Memory assist level: Complete Independence: No helper    Medical Problem List and Plan: 1.Debilitysecondary to cardiorenal syndrome, synovitis left hand due to RA   Cont CIR  PT, OT  2. DVT Prophylaxis/Anticoagulation: Eliquis. Monitor for any bleeding episodes 3. Pain Management:Neurontin 300 mg daily, Robaxin as needed   Voltaren gel added for joint pain.   Neuropathic pain Left hand - cont gabapentin, started on capsasin cream .025%   Lidoderm patch added on  3/5   Controlled on 3/18- better since prednisone started 4. Mood:Provide emotional support 5. Neuropsych: This patientiscapable of making decisions on hisown behalf. 6. Skin/Wound Care:Routine skin checks 7. Fluids/Electrolytes/Nutrition:Routine I&O's 8.End-stage renal disease. Hemodialysis initiated Tuesday Thursday Saturday schedule. Patient with left AVFgraft placed 05/10/2017   Appreciate nephro recs. 9.Acute on chronic anemia. Continue Aranesp.    Hb 8.8 on 3/16   Cont to monitor 10.Steroid induced hyperglycemia on Diabetes mellitus with peripheral neuropathy. Latest hemoglobin A1c 9.2. Diabetic teaching   Lantus insulin 25 units daily at bedtime, increased to 30 on 3/8, increased to 35 on 3/14.  Steroids being tapered   NovoLog 3 units 3 times a day, increased to 6 on 3/4, increased to 10 on 3/14.  Increase lantus to 40U   -Sugars seem to be increasing in the afternoon and evening hours.  Cover with sliding scale insulin as prednisone taper ongoing   -diet discussed CBG (last 3)  Recent Labs    05/30/17 1646 05/30/17 2053 05/31/17 0710  GLUCAP 200* 257* 268*   11.Acute on chronic diastolic congestive heart failure. Monitor for any signs of fluid overload. Continue Coreg 6.25mg  BID  Filed Weights   05/29/17 1420 05/30/17 0549 05/31/17 0500  Weight: 123.3 kg (271 lb 13.2 oz) 122.1 kg (269 lb 2.9 oz) 122.7 kg (270 lb 8.1 oz)     Volume management per nephrology.  12.Rheumatoid arthritis. Prednisone 10 mg daily. Patient had been receiving Abataceptevery Tuesday prior to admission. Added Voltaren gel. Follow-up rheumatologist Dr.Ziokowska   MRI right knee reviewed, showing OA, meniscal tear and patellar tendinosis on right.  Per Ortho, braces PRN   DMARD resumed on 3/5    Started on high-dose 6 week steroid taper on 3/6  13.Atrial fibrillation RVR. Status post cardioversion. Amiodarone decreased to 200 mg daily 3/3 14.Leukocytosis.     Low-grade fever:  Resolved   WBCs 23.7 on 3/13, 22K on 3/14--- likely steroid related   Hand films reviewed, suggesting RA changes   Continue to monitor   Cont to monitor 15.  Left hand numbness   Likely related to fistula placement +/- RA 16. Labile blood pressure   Vitals:   05/30/17 1346 05/31/17 0348  BP: 132/83 (!) 152/92  Pulse: 87 95  Resp: 18 18  Temp:  97.6 F (36.4 C)  SpO2: 100% 98%  elevated 3/18, monitor pattern 17. 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 Constipation with gas   Bowel regimen increased on 3/7   Simethicone started on 3/14 19.  Hypokalemia and hyponatremia- mild, Nephro to adjust dialysate LOS (Days) 18 A FACE TO FACE EVALUATION WAS PERFORMED  Charlett Blake 05/31/2017 7:37 AM

## 2017-05-31 NOTE — Progress Notes (Signed)
Strong odor with increased drainage noted from sacral wound. Wound nurse contacted and message left. Claude Manges, LPN

## 2017-05-31 NOTE — Consult Note (Addendum)
[place] Nurse wound re-consult note Refer to previous consult note on 3/15.  Bedside nurse reports wounds have increased odor. Reason for Consult: Unstageable wounds to inner gluteal fold area and right buttock. Wound type: Unstageable pressure injuries Pressure Injury POA: No Measurement: Sacral/coccyx wound measures 7 cm x 2 cm; right buttock wound measures 3 cm x 1 cm.   Both sites are 100% slough and have a very strong foul odor and mod amt tan drainage, there is some fluctuance when probed and areas are painful to touch.  Dressing procedure/placement/frequency: Discussed plan of care with primary team PA.  Continue Santyl with saline moistened gauze to yellow slough areas to assist with enzymatic debridement, and begin hydrotherapy to assist with removal of nonviable tissue.  This therapy is not available after the patient is discharged and pt could benefit from a longer stay to promote wound healing. Recommend follow-up at the outpatient wound care center after discharge for continued debridement. Pt has previously declined air mattress for pressure reduction.  [name] Girt MSN, RN, [place], [place], [place]

## 2017-05-31 NOTE — Progress Notes (Signed)
Spoke with wound nurse. States will be in this afternoon to assess. Claude Manges, LPN

## 2017-05-31 NOTE — Progress Notes (Signed)
Physical Therapy Session Note  Patient Details  Name: John Bailey MRN: [id number] Date of Birth: [birth year]  Today's Date: 05/31/2017 PT Individual Time: 0801-0911, 7124-5809 PT Individual Time Calculation (min): 70 min , 44 min  Short Term Goals: Week 3:  PT Short Term Goal 1 (Week 3): STG=LTG  Skilled Therapeutic Interventions/Progress Updates:    Session 1: Pt supine in bed upon PT arrival, agreeable to therapy tx and reports pain 5/10 on buttocks secondary to pressure sore. Therapist educated pt on importance of pressure relief, pt resistant to education. Pt transferred from supine>sitting EOB with supervision. Pt doffed shirt and sat edge of bed to wash body and change clothing, using reacher to loop LEs through pants and don shoes with supervision. Pt performed sit<>stand with supervision from elevated bed and ambulated to the sink with supervision. Pt worked on dynamic standing balance to brush teeth and apply deodorant. Pt propelled w/c from room<>gym x 150 ft with B LEs and R UE with supervision. Pt ambulated x 85 ft with supervision and L platform RW. Pt worked on standing balance within parallel bars, able to stand x 30 sec without UE support. Pt propelled w/c back to room and left seated in w/c with needs in reach. Therapist emphasized importance of family education with wife prior to d/c, pt states she is only available 1230-1300 on Wednesday.    Session 2: Pt sidelying in bed upon PT arrival, agreeable to therapy tx and reports 5/10 pain in buttocks region. Therapist emphasized the importance of sidelying for pressure relief and for shifting weight when sitting up in chair. Pt transferred from supine>sitting EOB with supervision and use of bed rails. Pt donned shoes with supervision and use of reacher. Pt transferred from bed>w/c stand pivot with supervision, therapist stabilizing RW. Pt propelled w/c throughout unit and outside on unlevel surfaces with supervision >200 ft. Pt left  seated in w/c at end of session with needs in reach.    Therapy Documentation Precautions:  Precautions Precautions: Fall Precaution Comments: Severe RA in shoulders, left hand Required Braces or Orthoses: Other Brace/Splint Other Brace/Splint: R ankle brace Restrictions Weight Bearing Restrictions: No   See Function Navigator for Current Functional Status.   Therapy/Group: Individual Therapy  Netta Corrigan, PT, DPT 05/31/2017, 7:46 AM

## 2017-05-31 NOTE — Progress Notes (Signed)
Social Work Patient ID: John Bailey, male   DOB: Jan 08, 1971, 47 y.o.   MRN: [id number]  Met with pt to discuss equipment needs and follow up. Also the need for his wife to come in and go through therapies with him before he goes home. He gave worker permission to contact wife. Have scheduled her to come in Wed form 1:00-2:00 to go through family education. [place] RN in and wants to being hydrotherapy, Dan-PA aware. Question if need to extend stay according to [facility]

## 2017-05-31 NOTE — Progress Notes (Signed)
Occupational Therapy Session Note  Patient Details  Name: John Bailey MRN: [id number] Date of Birth: [birth year]  Today's Date: 05/31/2017 OT Individual Time: 7800-4471 OT Individual Time Calculation (min): 45 min    Short Term Goals: Week 3:  OT Short Term Goal 1 (Week 3): Pt will continue working on updated LTGs set at min assist.   Skilled Therapeutic Interventions/Progress Updates:    Pt received in bed stating he had just gotten back to bed.  He stated he was in a lot of pain from sacral wound and sitting in the w/c aggravates his pain.  He stated he was already bathed and dressed earlier this morning.  Pt discussed his limited ROM in B hands.  Used heat on R hand for 10 min while working on L hand ROM and then alternated hands.  Pt states the heat is helpful.  Further A/arom in B hands with R hand having more flexibility and strength than the L.  Provided pt with round stress ball to work on R hand grasp and focus on maintaining open web space.  Pt very lethargic this session, frequently closing his eyes but saying he was awake. Worked on improved sidelying positioning with pillow between knees.   Pt resting in bed with all needs met.  Therapy Documentation Precautions:  Precautions Precautions: Fall Precaution Comments: Severe RA in shoulders, left hand Required Braces or Orthoses: Other Brace/Splint Other Brace/Splint: R ankle brace Restrictions Weight Bearing Restrictions: No   Pain: Pain Assessment Pain Assessment: 0-10 Pain Score: 7  Pain Type: Acute pain Pain Location: Sacrum Pain Orientation: Mid Pain Descriptors / Indicators: Burning Pain Frequency: Constant Pain Onset: On-going Patients Stated Pain Goal: 2 Pain Intervention(s): Repositioned(refused prn medication) ADL:    See Function Navigator for Current Functional Status.   Therapy/Group: Individual Therapy  [place] 05/31/2017, 12:30 PM

## 2017-05-31 NOTE — Progress Notes (Signed)
Occupational Therapy Note  Patient Details  Name: John Bailey MRN: [id number] Date of Birth: February 08, 1971  Today's Date: 05/31/2017 OT Individual Time: 1115-1200 OT Individual Time Calculation (min): 45 min   Pt c/o increased pain (unrated) in sacral area; RN aware and repositioned Individual Therapy  Pt asleep upon arrival but easily aroused.  OT intervention with focus on bed mobility, directing care, discharge planning, BUE/hand AROM.  Pt demonstrated BUE AAROM/AROM and finger flexion/extension to maintain ROM and increase independence with BADLs.  Pt noted with difficulty keeping eyes open but easily aroused when occurred. Pt able to roll side>side in bed with min A and reposition in bed by pushing with BLE to head of bed. Pt remained in bed with lunch tray positioned and setup.  Bed alarm activated and all needs within reach.    [name] [facility] 05/31/2017, 12:08 PM

## 2017-05-31 NOTE — Plan of Care (Signed)
  Progressing Consults RH GENERAL PATIENT EDUCATION Description See Patient Education module for education specifics. 05/31/2017 1407 - Progressing by Claude Manges, LPN Skin Care Protocol Initiated - if Braden Score 18 or less Description If consults are not indicated, leave blank or document N/A 05/31/2017 1407 - Progressing by Claude Manges, LPN Diabetes Guidelines if Diabetic/Glucose > 140 Description If diabetic or lab glucose is > 140 mg/dl - Initiate Diabetes/Hyperglycemia Guidelines & Document Interventions  05/31/2017 1407 - Progressing by Claude Manges, LPN RH SKIN INTEGRITY RH STG ABLE TO PERFORM INCISION/WOUND CARE W/ASSISTANCE Description STG Able To Perform Incision/Wound Care With mod Assistance.  05/31/2017 1407 - Progressing by Claude Manges, LPN RH PAIN MANAGEMENT RH STG PAIN MANAGED AT OR BELOW PT'S PAIN GOAL Description < 5  05/31/2017 1407 - Progressing by Claude Manges, LPN   Not Progressing RH SKIN INTEGRITY RH STG SKIN FREE OF INFECTION/BREAKDOWN Description Patients skin will remain free from further breakdown or infection with mod assist.  05/31/2017 1407 - Not Progressing by Claude Manges, LPN RH STG MAINTAIN SKIN INTEGRITY WITH ASSISTANCE Description STG Maintain Skin Integrity With mod Assistance.  05/31/2017 1407 - Not Progressing by Claude Manges, LPN Strong odor, drainage noted today with sacral/ gluteal wound. Wound nurse notified. Stressed importance of frequent repositioning for prevention of skin breakdown. Claude Manges, LPN

## 2017-06-01 ENCOUNTER — Inpatient Hospital Stay (HOSPITAL_COMMUNITY): Payer: BLUE CROSS/BLUE SHIELD

## 2017-06-01 ENCOUNTER — Inpatient Hospital Stay (HOSPITAL_COMMUNITY): Payer: BLUE CROSS/BLUE SHIELD | Admitting: Physical Therapy

## 2017-06-01 LAB — GLUCOSE, CAPILLARY
GLUCOSE-CAPILLARY: 137 mg/dL — AB (ref 65–99)
Glucose-Capillary: 279 mg/dL — ABNORMAL HIGH (ref 65–99)
Glucose-Capillary: 119 mg/dL — ABNORMAL HIGH (ref 65–99)

## 2017-06-01 LAB — RENAL FUNCTION PANEL
ALBUMIN: 2.8 g/dL — AB (ref 3.5–5.0)
ANION GAP: 15 (ref 5–15)
BUN: 128 mg/dL — ABNORMAL HIGH (ref 6–20)
CALCIUM: 9.2 mg/dL (ref 8.9–10.3)
CO2: 23 mmol/L (ref 22–32)
Chloride: 96 mmol/L — ABNORMAL LOW (ref 101–111)
Creatinine, Ser: 7.22 mg/dL — ABNORMAL HIGH (ref 0.61–1.24)
GFR calc non Af Amer: 8 mL/min — ABNORMAL LOW (ref >60)
GFR, EST AFRICAN AMERICAN: 10 mL/min — AB (ref >60)
Glucose, Bld: 111 mg/dL — ABNORMAL HIGH (ref 65–99)
PHOSPHORUS: 4.4 mg/dL (ref 2.5–4.6)
POTASSIUM: 3.6 mmol/L (ref 3.5–5.1)
SODIUM: 134 mmol/L — AB (ref 135–145)

## 2017-06-01 LAB — CBC
HEMATOCRIT: 29.2 % — AB (ref 39.0–52.0)
HEMOGLOBIN: 9.4 g/dL — AB (ref 13.0–17.0)
MCH: 27.8 pg (ref 26.0–34.0)
MCHC: 32.2 g/dL (ref 30.0–36.0)
MCV: 86.4 fL (ref 78.0–100.0)
Platelets: 493 10*3/uL — ABNORMAL HIGH (ref 150–400)
RBC: 3.38 MIL/uL — AB (ref 4.22–5.81)
RDW: 23.1 % — ABNORMAL HIGH (ref 11.5–15.5)
WBC: 23.6 10*3/uL — ABNORMAL HIGH (ref 4.0–10.5)

## 2017-06-01 MED ORDER — INSULIN GLARGINE 100 UNIT/ML ~~LOC~~ SOLN
45.0000 [IU] | Freq: Every day | SUBCUTANEOUS | Status: DC
  Administered 2017-06-01: 45 [IU] via SUBCUTANEOUS
  Filled 2017-06-01: qty 0.45

## 2017-06-01 MED ORDER — CALCITRIOL 0.5 MCG PO CAPS
ORAL_CAPSULE | ORAL | Status: AC
  Administered 2017-06-01: 0.5 ug via ORAL
  Filled 2017-06-01: qty 1

## 2017-06-01 NOTE — Progress Notes (Signed)
[place] PHYSICAL MEDICINE & REHABILITATION     PROGRESS NOTE  Subjective/Complaints:   Didn't sleep much yesterday due to bible study, otherwise no c/os   ROS: Patient denies fever, rash, sore throat, blurred vision, nausea, vomiting, diarrhea, cough, shortness of breath or chest pain, joint or back pain, headache, or mood change.   Objective: Vital Signs: Blood pressure (!) 164/93, pulse 82, temperature 97.6 F (36.4 C), temperature source Axillary, resp. rate 19, height 6' (1.829 m), weight 123 kg (271 lb 2.7 oz), SpO2 100 %. No results found. Recent Labs    05/29/17 1355  WBC 22.4*  HGB 8.8*  HCT 27.4*  PLT 508*   Recent Labs    05/29/17 1406  NA 132*  K 3.2*  CL 94*  GLUCOSE 135*  BUN 103*  CREATININE 6.52*  CALCIUM 8.6*   CBG (last 3)  Recent Labs    05/31/17 1711 05/31/17 2136 06/01/17 0640  GLUCAP 223* 390* 279*    Wt Readings from Last 3 Encounters:  06/01/17 123 kg (271 lb 2.7 oz)  05/13/17 132.1 kg (291 lb 3.6 oz)  04/02/17 (!) 154.9 kg (341 lb 9.6 oz)    Physical Exam:  BP (!) 164/93 (BP Location: Right Arm)   Pulse 82   Temp 97.6 F (36.4 C) (Axillary)   Resp 19   Ht 6' (1.829 m)   Wt 123 kg (271 lb 2.7 oz)   SpO2 100%   BMI 36.78 kg/m  Constitutional: No distress . Vital signs reviewed. HEENT: EOMI, oral membranes moist Cardiovascular: RRR without murmur. No JVD    Respiratory: CTA Bilaterally without wheezes or rales. Normal effort    GI: BS +, non-tender, non-distended  Musc:  Bouchard's deformities  +Edema b/l LE--stable tr to 1+ Neurological: Alert and oriented.  Motor: Right upper extremity 4-/5 proximal to distal (stable exam) Left upper extremity: Shoulder abduction, elbow flexion/extension 3-4-/5, hand grip 2-/5 (no change) Bilateral lower extremities: 3+-4-/5 proximal to distal (some pain inhibition, gradually improving)  Sensation is intact to light touch bilateral upper and lower limbs Skin.  Lower extremity ischemic  changes.   Assessment/Plan: 1. Functional deficits secondary to debility which require 3+ hours per day of interdisciplinary therapy in a comprehensive inpatient rehab setting. Physiatrist is providing close team supervision and 24 hour management of active medical problems listed below. Physiatrist and rehab team continue to assess barriers to discharge/monitor patient progress toward functional and medical goals.  Function:  Bathing Bathing position   Position: Standing at sink  Bathing parts Body parts bathed by patient: Right arm, Left arm, Chest, Abdomen, Front perineal area, Right upper leg, Left upper leg, Right lower leg, Left lower leg, Buttocks Body parts bathed by helper: Back  Bathing assist Assist Level: Touching or steadying assistance(Pt > 75%)      Upper Body Dressing/Undressing Upper body dressing   What is the patient wearing?: Pull over shirt/dress     Pull over shirt/dress - Perfomed by patient: Thread/unthread right sleeve, Thread/unthread left sleeve, Put head through opening Pull over shirt/dress - Perfomed by helper: Pull shirt over trunk        Upper body assist Assist Level: Touching or steadying assistance(Pt > 75%)      Lower Body Dressing/Undressing Lower body dressing Lower body dressing/undressing activity did not occur: N/A What is the patient wearing?: Underwear, Pants, Shoes, AFO Underwear - Performed by patient: Thread/unthread right underwear leg, Thread/unthread left underwear leg, Pull underwear up/down Underwear - Performed by helper: Pull  underwear up/down Pants- Performed by patient: Thread/unthread left pants leg, Thread/unthread right pants leg, Pull pants up/down Pants- Performed by helper: Pull pants up/down         Shoes - Performed by patient: Don/doff right shoe, Don/doff left shoe Shoes - Performed by helper: Don/doff right shoe, Don/doff left shoe AFO - Performed by patient: Don/doff right AFO AFO - Performed by helper:  Don/doff right AFO      Lower body assist Assist for lower body dressing: Touching or steadying assistance (Pt > 75%)      Toileting Toileting Toileting activity did not occur: No continent bowel/bladder event Toileting steps completed by patient: Adjust clothing prior to toileting, Adjust clothing after toileting, Performs perineal hygiene Toileting steps completed by helper: Adjust clothing after toileting, Adjust clothing prior to toileting    Toileting assist Assist level: Touching or steadying assistance (Pt.75%)   Transfers Chair/bed transfer Chair/bed transfer activity did not occur: Refused Chair/bed transfer method: Stand pivot Chair/bed transfer assist level: Touching or steadying assistance (Pt > 75%) Chair/bed transfer assistive device: Armrests, Walker Mechanical lift: Ecologist Ambulation activity did not occur: Refused   Max distance: 85 ft Assist level: Touching or steadying assistance (Pt > 75%)   Wheelchair Wheelchair activity did not occur: Refused Type: Manual Max wheelchair distance: 150' Assist Level: Supervision or verbal cues  Cognition Comprehension Comprehension assist level: Follows complex conversation/direction with no assist  Expression Expression assist level: Expresses complex ideas: With extra time/assistive device  Social Interaction Social Interaction assist level: Interacts appropriately with others - No medications needed.  Problem Solving Problem solving assist level: Solves basic 90% of the time/requires cueing < 10% of the time  Memory Memory assist level: Complete Independence: No helper    Medical Problem List and Plan: 1.Debilitysecondary to cardiorenal syndrome, synovitis left hand due to RA   Cont CIR  PT, OT- caregiver training in am  2. DVT Prophylaxis/Anticoagulation: Eliquis. Monitor for any bleeding episodes 3. Pain Management:Neurontin 300 mg daily, Robaxin as needed   Voltaren gel added for joint  pain.   Neuropathic pain Left hand - cont gabapentin, started on capsasin cream .025%   Lidoderm patch added on 3/5   Controlled on 3/19- better since prednisone started 4. Mood:Provide emotional support 5. Neuropsych: This patientiscapable of making decisions on hisown behalf. 6. Skin/Wound Care:Routine skin checks 7. Fluids/Electrolytes/Nutrition:Routine I&O's 8.End-stage renal disease. Hemodialysis initiated Tuesday Thursday Saturday schedule. Patient with left AVFgraft placed 05/10/2017   Appreciate nephro recs. 9.Acute on chronic anemia. Continue Aranesp.    Hb 8.8 on 3/16   Cont to monitor 10.Steroid induced hyperglycemia on Diabetes mellitus with peripheral neuropathy. Latest hemoglobin A1c 9.2. Diabetic teaching   Lantus insulin 25 units daily at bedtime, increased to 30 on 3/8, increased to 35 on 3/14.  Steroids being tapered   NovoLog 3 units 3 times a day, increased to 6 on 3/4, increased to 10 on 3/14.  Increase lantus to 45U   -Sugars seem to be increasing in the afternoon and evening hours.  Cover with sliding scale insulin as prednisone taper ongoing   -diet discussed CBG (last 3)  Recent Labs    05/31/17 1711 05/31/17 2136 06/01/17 0640  GLUCAP 223* 390* 279*   11.Acute on chronic diastolic congestive heart failure. Monitor for any signs of fluid overload. Continue Coreg 6.25mg  BID  Filed Weights   05/30/17 0549 05/31/17 0500 06/01/17 0407  Weight: 122.1 kg (269 lb 2.9 oz) 122.7 kg (270 lb 8.1  oz) 123 kg (271 lb 2.7 oz)     Volume management per nephrology.    12.Rheumatoid arthritis. Prednisone 10 mg daily. Patient had been receiving Abataceptevery Tuesday prior to admission. Added Voltaren gel. Follow-up rheumatologist Dr.Ziokowska   MRI right knee reviewed, showing OA, meniscal tear and patellar tendinosis on right.  Per Ortho, braces PRN   DMARD resumed on 3/5    Started on high-dose 6 week steroid taper on 3/6  13.Atrial fibrillation RVR. Status  post cardioversion. Amiodarone decreased to 200 mg daily 3/3 14.Leukocytosis.     afebrile   WBCs 23.7 on 3/13, 22K on 3/14--- likely steroid related   Hand films reviewed, suggesting RA changes   Continue to monitor   Cont to monitor 15.  Left hand numbness   Likely related to fistula placement +/- RA 16. Labile blood pressure   Vitals:   05/31/17 1436 06/01/17 0407  BP: 129/86 (!) 164/93  Pulse: 100 82  Resp: 20 19  Temp: 97.7 F (36.5 C) 97.6 F (36.4 C)  SpO2: 99% 100%  may be fluctuating wit HD 17. 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 Constipation with gas   Bowel regimen increased on 3/7   Simethicone started on 3/14 19.  Hypokalemia and hyponatremia- mild, Nephro to adjust dialysate LOS (Days) 19 A FACE TO FACE EVALUATION WAS PERFORMED  Charlett Blake 06/01/2017 8:02 AM

## 2017-06-01 NOTE — Progress Notes (Signed)
Assessment/Plan:  1. New ESRD2/2 DM/HTN.  TTS at [facility] (but wants MWF).  Poss DC 3/21. Next HD Today, needs lower weight 2. AnemiaDarbe and iron 3. HPTHcalcitriol QOD - and binders 4. Malnutrition - renal vit/suppls- albumin improving 5. DM controlled 6. Afib on amio/apixaban 7. HTN/vol- Still trying establish an EDW 8. RA - pred taper + topicals   Subjective: Interval History: c/o IDHT with dialysis  Objective: Vital signs in last 24 hours: Temp:  [97.6 F (36.4 C)-97.7 F (36.5 C)] 97.6 F (36.4 C) (03/19 0407) Pulse Rate:  [82-100] 82 (03/19 0407) Resp:  [19-20] 19 (03/19 0407) BP: (129-164)/(86-93) 164/93 (03/19 0407) SpO2:  [99 %-100 %] 100 % (03/19 0407) Weight:  [123 kg (271 lb 2.7 oz)] 123 kg (271 lb 2.7 oz) (03/19 0407) Weight change: 0.3 kg (10.6 oz)  Intake/Output from previous day: 03/18 0701 - 03/19 0700 In: 240 [P.O.:240] Out: 150 [Urine:150] Intake/Output this shift: Total I/O In: 120 [P.O.:120] Out: -   General appearance: alert and cooperative Resp: clear to auscultation bilaterally Abd obese  Ext 3+ edema  Lab Results: Recent Labs    05/29/17 1355  WBC 22.4*  HGB 8.8*  HCT 27.4*  PLT 508*   BMET:  Recent Labs    05/29/17 1406  NA 132*  K 3.2*  CL 94*  CO2 22  GLUCOSE 135*  BUN 103*  CREATININE 6.52*  CALCIUM 8.6*   No results for input(s): PTH in the last 72 hours. Iron Studies: No results for input(s): IRON, TIBC, TRANSFERRIN, FERRITIN in the last 72 hours. Studies/Results: No results found.  Scheduled: . Abatacept  125 mg Subcutaneous Q Tue  . amiodarone  200 mg Oral Daily  . apixaban  5 mg Oral BID  . calcitRIOL  0.5 mcg Oral Q T,Th,Sa-HD  . calcium acetate  1,334 mg Oral TID WC  . capsaicin   Topical BID  . collagenase   Topical Daily  . darbepoetin (ARANESP) injection - DIALYSIS  150 mcg Intravenous Q Tue-HD  . feeding supplement (NEPRO CARB STEADY)  237 mL Oral BID BM  . feeding supplement (PRO-STAT  SUGAR FREE 64)  30 mL Oral BID  . fluticasone  2 spray Each Nare Daily  . gabapentin  300 mg Oral Daily  . Gerhardt's butt cream   Topical BID  . insulin aspart  0-15 Units Subcutaneous TID WC  . insulin aspart  10 Units Subcutaneous TID WC  . insulin glargine  45 Units Subcutaneous QHS  . lidocaine  3 patch Transdermal Q24H  . multivitamin  1 tablet Oral QHS  . [START ON 06/02/2017] predniSONE  40 mg Oral Q breakfast   Followed by  . [START ON 06/09/2017] predniSONE  30 mg Oral Q breakfast   Followed by  . [START ON 06/16/2017] predniSONE  20 mg Oral Q breakfast   Followed by  . [START ON 06/23/2017] predniSONE  10 mg Oral Q breakfast  . senna-docusate  2 tablet Oral QHS  . simethicone  80 mg Oral QID    LOS: 19 days   Estanislado Emms 06/01/2017,11:29 AM

## 2017-06-01 NOTE — Progress Notes (Signed)
Physical Therapy Session Note  Patient Details  Name: John Bailey MRN: [id number] Date of Birth: 1970-09-24  Today's Date: 06/01/2017 PT Individual Time: 0800-0913 PT Individual Time Calculation (min): 73 min   Short Term Goals: Week 3:  PT Short Term Goal 1 (Week 3): STG=LTG  Skilled Therapeutic Interventions/Progress Updates:   Pt in supine and agreeable to therapy, pain 5/10 at sacral wound. RN present to perform skin check and wound dressing change. Providing skilled assist for positioning and rolling w/ min assist to each side. Educated pt on using supine bridge technique when moving in bed and rolling to decreased pressure on sacrum. Transferred to EOB w/ supervision using bedrails and provided set-up assist to don UE garments, total assist for LE garments, shoes, and R ankle brace. Continued education on performing tasks (bed mobility and dressing) by himself as much as possible in preparation for d/c home, however resistant to all education provided by therapist during this session. Transferred to w/c via stand pivot w/ supervision using [employer]. Total assist for w/c parts management. Pt self-propelled w/c to therapy gym w/ close supervision using BLEs and RUE. Cautioned regarding safety w/ anterior weight shifting too far forward, no overt LOB forward observed. Worked on sit<>stands in gym from w/c w/o using [place] to boost into standing, able to perform x3 w/ min assist to boost and verbal cues for technique pushing up on armrests and LEs. Able to stabilize in stance w/o UE support on walker. Performed partial knee bends in standing w/o UE support 10x3 bouts in total, focus on LE strengthening in movement similar to boosting to stand. Intermittent rest w/ UEs on PFRW 2/2 LE fatigue. Returned to room total assist in w/c for time management. Ended session in w/c, call bell within reach and all needs met.   Therapy Documentation Precautions:  Precautions Precautions: Fall Precaution  Comments: Severe RA in shoulders, left hand Required Braces or Orthoses: Other Brace/Splint Other Brace/Splint: R ankle brace Restrictions Weight Bearing Restrictions: No Vital Signs: Therapy Vitals Temp: (!) 97.5 F (36.4 C) Temp Source: Oral Pulse Rate: 81 Resp: 18 BP: 126/72 Patient Position (if appropriate): Lying  See Function Navigator for Current Functional Status.   Therapy/Group: Individual Therapy  Samuel Mcpeek K Arnette 06/01/2017, 5:06 PM

## 2017-06-01 NOTE — Progress Notes (Signed)
Physical Therapy Session Note  Patient Details  Name: MALAKYE NOLDEN MRN: [id number] Date of Birth: 05-Jul-1970  Today's Date: 06/01/2017 PT Individual Time: 3794-4461 PT Individual Time Calculation (min): 70 min   Short Term Goals: Week 3:  PT Short Term Goal 1 (Week 3): STG=LTG  Skilled Therapeutic Interventions/Progress Updates:    Pt seated in w/c upon PT arrival, agreeable to therapy tx and denies pain. Pt transported from room>gym in w/c. Pt ambulated 2 x 200 ft with L platform RW and supervision, min assist to boost up to standing from w/c. Pt ascended/descended 1 steps (6inch) with mod assist, B handrails and verbal cues for techniques. Pt propelled w/c from gym>dayroom with supervision using B LEs and R LE, therapist encouraged use of L UE but pts states he can't. Pt used cybex kinetron x 5 minutes on 30 cm/sec for LE strengthening while seated. Pt performed LE therex 2 x 10 LAQ and seated marches for strengthening. Pt transported to gym, worked on standing balance without UE support to reach for horse shoes outside BOS and toss. Pt transported back to room and left seated with needs in reach.   Therapy Documentation Precautions:  Precautions Precautions: Fall Precaution Comments: Severe RA in shoulders, left hand Required Braces or Orthoses: Other Brace/Splint Other Brace/Splint: R ankle brace Restrictions Weight Bearing Restrictions: No   See Function Navigator for Current Functional Status.   Therapy/Group: Individual Therapy  Netta Corrigan, PT, DPT 06/01/2017, 7:53 AM

## 2017-06-01 NOTE — Progress Notes (Signed)
Physical Therapy Wound Treatment Patient Details  Name: John Bailey MRN: [id number] Date of Birth: 07-16-1970  Today's Date: 06/01/2017 PT Individual Time:1336-1426  PT Individual Time Calculation (min): 50 min       Subjective  Subjective: Pt states he is tired of how his wound stinks Patient and Family Stated Goals: heal wound decrease odor  Pain Score:  Pt premedicated prior to treatment. No complaints of pain.   Objective  Wound Assessment  Pressure Injury 05/28/17 Unstageable - Full thickness tissue loss in which the base of the ulcer is covered by slough (yellow, tan, gray, green or brown) and/or eschar (tan, brown or black) in the wound bed. Central gluteal fold (Active)  Dressing Type ABD;Barrier Film (skin prep);Gauze (Comment);Moist to dry;Other (Comment) 06/01/2017  5:00 PM  Dressing Changed 06/01/2017  5:00 PM  Dressing Change Frequency Daily 06/01/2017  5:00 PM  State of Healing Non-healing 06/01/2017  5:00 PM  Site / Wound Assessment Yellow;Brown 06/01/2017  5:00 PM  % Wound base Red or Granulating 90% 06/01/2017  5:00 PM  % Wound base Yellow/Fibrinous Exudate 10% 06/01/2017  5:00 PM  Peri-wound Assessment Erythema (blanchable);Maceration;Pink;Denuded 06/01/2017  5:00 PM  Wound Length (cm) 8.5 cm 06/01/2017  5:00 PM  Wound Width (cm) 3.5 cm 06/01/2017  5:00 PM  Wound Depth (cm) 3 cm 06/01/2017  5:00 PM  Wound Surface Area (cm^2) 29.75 cm^2 06/01/2017  5:00 PM  Wound Volume (cm^3) 89.25 cm^3 06/01/2017  5:00 PM  Margins Unattached edges (unapproximated) 06/01/2017  5:00 PM  Drainage Amount Minimal 06/01/2017  5:00 PM  Drainage Description Purulent;Odor 06/01/2017  5:00 PM  Treatment Cleansed;Debridement (Selective);Hydrotherapy (Pulse lavage);Packing (Saline gauze);Other (Comment) 06/01/2017  5:00 PM     Wound / Incision (Open or Dehisced) 05/28/17 Non-pressure wound Sacrum Right;Left;Medial R buttock (Active)  Dressing Type ABD;Barrier Film (skin prep);Gauze (Comment);Moist to  dry;Other (Comment) 06/01/2017  5:00 PM  Dressing Changed Changed 06/01/2017  5:00 PM  Dressing Status Clean;Dry;Intact 06/01/2017  5:00 PM  Dressing Change Frequency Daily 06/01/2017  5:00 PM  Site / Wound Assessment Yellow;Painful 06/01/2017  5:00 PM  % Wound base Yellow/Fibrinous Exudate 100% 06/01/2017  5:00 PM  Peri-wound Assessment Denuded;Pink 06/01/2017  5:00 PM  Wound Length (cm) 3.5 cm 06/01/2017  5:00 PM  Wound Width (cm) 2.5 cm 06/01/2017  5:00 PM  Wound Surface Area (cm^2) 8.75 cm^2 06/01/2017  5:00 PM  Margins Unattached edges (unapproximated) 06/01/2017  5:00 PM  Closure None 06/01/2017  8:20 AM  Drainage Amount Minimal 06/01/2017  5:00 PM  Drainage Description Purulent;Odor 06/01/2017  5:00 PM  Treatment Cleansed;Debridement (Selective);Hydrotherapy (Pulse lavage);Packing (Saline gauze) 06/01/2017  5:00 PM   Santyl applied to wound bed.   Hydrotherapy Pulsed lavage therapy - wound location: sacrum Pulsed Lavage with Suction (psi): 4 psi(4-12) Pulsed Lavage with Suction - Normal Saline Used: 1000 mL Pulsed Lavage Tip: Tip with splash shield Selective Debridement Selective Debridement - Location: sacrum  Selective Debridement - Tools Used: Forceps;Scissors Selective Debridement - Tissue Removed: brown, gray necrotic tissue.    Wound Assessment and Plan  Wound Therapy - Assess/Plan/Recommendations Wound Therapy - Clinical Statement: Pt wound is fully covered with yellow slough which loosened with pulsed lavage and a small amount was debrided. Pt will benefit from continued hydrotherapy and selective debridement to decrease bioburden and encourage wound healing.  Factors Delaying/Impairing Wound Healing: Immobility;Multiple medical problems Hydrotherapy Plan: Debridement;Patient/family education;Pulsatile lavage with suction Wound Therapy - Frequency: 6X / week Wound Therapy - Follow Up Recommendations: Wound Care  Center Wound Plan: see above  Wound Therapy Goals- Improve the  function of patient's integumentary system by progressing the wound(s) through the phases of wound healing (inflammation - proliferation - remodeling) by: Decrease Necrotic Tissue to: 50 Decrease Necrotic Tissue - Progress: Goal set today Increase Granulation Tissue to: 50 Increase Granulation Tissue - Progress: Goal set today Goals/treatment plan/discharge plan were made with and agreed upon by patient/family: Yes Time For Goal Achievement: 7 days Wound Therapy - Potential for Goals: Fair  Goals will be updated until maximal potential achieved or discharge criteria met.  Discharge criteria: when goals achieved, discharge from hospital, MD decision/surgical intervention, no progress towards goals, refusal/missing three consecutive treatments without notification or medical reason.  Merwin Breden B. [name] PT, DPT Acute Rehabilitation  [phone] Pager [phone]   [place] 06/01/2017, 5:42 PM

## 2017-06-01 NOTE — Plan of Care (Signed)
  RH SKIN INTEGRITY RH STG ABLE TO PERFORM INCISION/WOUND CARE W/ASSISTANCE Description STG Able To Perform Incision/Wound Care With mod Assistance.  06/01/2017 1540 - Progressing by Blinda Leatherwood, RN Continue to administer wound care as ordered.

## 2017-06-01 NOTE — Progress Notes (Signed)
Occupational Therapy Note  Patient Details  Name: John Bailey MRN: [id number] Date of Birth: [birth year]  Today's Date: 06/01/2017 OT Individual Time: 1130-1200 OT Individual Time Calculation (min): 30 min   Pt c/o sacral pain; RN aware and pt performed pressure relief at w/c level Individual Therapy  Pt resting in w/c upon arrival.  Pt removed L wrist splint to facilitate ability to perform L hand tasks including flexion/extension for functional tasks.  Pt also engaged in [place] table activities with focus on increased strength and function.  Pt remained in w/c with RN present.    [name] [facility] 06/01/2017, 12:26 PM

## 2017-06-02 ENCOUNTER — Inpatient Hospital Stay (HOSPITAL_COMMUNITY): Payer: BLUE CROSS/BLUE SHIELD | Admitting: Occupational Therapy

## 2017-06-02 ENCOUNTER — Ambulatory Visit (HOSPITAL_COMMUNITY): Payer: BLUE CROSS/BLUE SHIELD | Admitting: Occupational Therapy

## 2017-06-02 ENCOUNTER — Inpatient Hospital Stay (HOSPITAL_COMMUNITY): Payer: BLUE CROSS/BLUE SHIELD

## 2017-06-02 ENCOUNTER — Inpatient Hospital Stay (HOSPITAL_COMMUNITY): Payer: BLUE CROSS/BLUE SHIELD | Admitting: Physical Therapy

## 2017-06-02 LAB — GLUCOSE, CAPILLARY
GLUCOSE-CAPILLARY: 195 mg/dL — AB (ref 65–99)
GLUCOSE-CAPILLARY: 176 mg/dL — AB (ref 65–99)
GLUCOSE-CAPILLARY: 316 mg/dL — AB (ref 65–99)
Glucose-Capillary: 303 mg/dL — ABNORMAL HIGH (ref 65–99)

## 2017-06-02 MED ORDER — DARBEPOETIN ALFA 150 MCG/0.3ML IJ SOSY
150.0000 ug | PREFILLED_SYRINGE | INTRAMUSCULAR | Status: DC
  Administered 2017-06-03: 150 ug via INTRAVENOUS
  Filled 2017-06-02: qty 0.3

## 2017-06-02 MED ORDER — INSULIN GLARGINE 100 UNIT/ML ~~LOC~~ SOLN
30.0000 [IU] | Freq: Every day | SUBCUTANEOUS | Status: DC
  Administered 2017-06-02 – 2017-06-03 (×2): 30 [IU] via SUBCUTANEOUS
  Filled 2017-06-02 (×2): qty 0.3

## 2017-06-02 MED FILL — Electrolyte-R (PH 7.4) Solution: INTRAVENOUS | Qty: 3000 | Status: AC

## 2017-06-02 MED FILL — Heparin Sodium (Porcine) Inj 1000 Unit/ML: INTRAMUSCULAR | Qty: 50 | Status: AC

## 2017-06-02 MED FILL — Lidocaine HCl IV Inj 20 MG/ML: INTRAVENOUS | Qty: 25 | Status: AC

## 2017-06-02 MED FILL — Magnesium Sulfate Inj 50%: INTRAMUSCULAR | Qty: 10 | Status: AC

## 2017-06-02 MED FILL — Sodium Bicarbonate IV Soln 8.4%: INTRAVENOUS | Qty: 50 | Status: AC

## 2017-06-02 MED FILL — Sodium Chloride IV Soln 0.9%: INTRAVENOUS | Qty: 4000 | Status: AC

## 2017-06-02 MED FILL — Mannitol IV Soln 20%: INTRAVENOUS | Qty: 1000 | Status: AC

## 2017-06-02 NOTE — Progress Notes (Signed)
Physical Therapy Session Note  Patient Details  Name: John Bailey MRN: [id number] Date of Birth: March 23, 1970  Today's Date: 06/02/2017 PT Individual Time: 7209-4709 PT Individual Time Calculation (min): 72 min   Short Term Goals: Week 3:  PT Short Term Goal 1 (Week 3): STG=LTG  Skilled Therapeutic Interventions/Progress Updates:    Pt seated in w/c upon PT arrival, agreeable to therapy tx and denies pain. Pt transported from [employer] total assist. Pt performed car transfer from w/c<>car stand pivot with platform RW and min assist to stabilize RW and to help manage LEs. Pt ambulated 2 x 200 ft with L platform RW and supervision, min assist to stabilize walker for sit<>stand. Pt worked on dynamic standing balance without UE support to perform UE card matching activity and to participate in ball toss. Pt ascended/descended 1 step with mod assist using B handrails. Pt propelled w/c back to room with supervision and transferred to bed stand pivot with RW. Pt left seated EOB with needs in reach and handed off to therapy tech for hydrotherapy.   Therapy Documentation Precautions:  Precautions Precautions: Fall Precaution Comments: Severe RA in shoulders, left hand Required Braces or Orthoses: Other Brace/Splint Other Brace/Splint: R ankle brace Restrictions Weight Bearing Restrictions: No   See Function Navigator for Current Functional Status.   Therapy/Group: Individual Therapy  Netta Corrigan, PT, DPT 06/02/2017, 7:57 AM

## 2017-06-02 NOTE — Patient Care Conference (Signed)
Inpatient RehabilitationTeam Conference and Plan of Care Update Date: 06/02/2017   Time: 2:00 PM    Patient Name: John Bailey      Medical Record Number: [id number]  Date of Birth: 04-24-1970 Sex: Male         Room/Bed: 4M08C/4M08C-01 Payor Info: Payor: BLUE CROSS BLUE SHIELD / Plan: BCBS OTHER / Product Type: *No Product type* /    Admitting Diagnosis: Debility  Admit Date/Time:  05/13/2017  6:09 PM Admission Comments: No comment available   Primary Diagnosis:  <principal problem not specified> Principal Problem: <principal problem not specified>  Patient Active Problem List   Diagnosis Date Noted  . Constipation   . Steroid-induced hyperglycemia   . HSV (herpes simplex virus) infection   . Neuropathic pain   . Labile blood pressure   . Labile blood glucose   . Carpal tunnel syndrome of left wrist   . Pain   . Leukocytosis   . PAF (paroxysmal atrial fibrillation) ([place])   . Acute on chronic diastolic (congestive) heart failure ([place])   . Type 2 diabetes mellitus with peripheral neuropathy (HCC)   . Acute blood loss anemia   . Anemia of chronic disease   . ESRD on dialysis ([place])   . Cardiorenal syndrome with renal failure   . Cardiorenal syndrome, stage 1-4 or unspecified chronic kidney disease, with heart failure ([place]) 05/06/2017  . Atrial fibrillation, chronic ([place]) 04/22/2017  . Hypoxia 04/22/2017  . Excessive daytime sleepiness 04/02/2017  . Debility 04/02/2017  . Bradycardia 03/28/2017  . CKD (chronic kidney disease) stage 4, GFR 15-29 ml/min (HCC) 03/28/2017  . Pseudogout 03/09/2017  . Rheumatoid arthritis ([place]) 03/09/2017  . Infected ulcer of skin, with fat layer exposed ([place]) 03/09/2017  . Normocytic anemia 03/06/2017  . Elevated troponin 03/06/2017  . Acute renal failure superimposed on stage 4 chronic kidney disease ([place]) 03/06/2017  . Essential hypertension 12/29/2016  . Persistent atrial fibrillation ([place])   . Left leg cellulitis   . Atrial fibrillation  with RVR ([place]) 12/02/2016  . Sepsis ([place]) 12/02/2016  . Acute on chronic congestive heart failure ([place])   . Acute on chronic renal insufficiency   . Chronic diastolic CHF (congestive heart failure) ([place]) 03/20/2016  . Nonischemic cardiomyopathy ([place]) 11/11/2015  . Long term (current) use of anticoagulants [Z79.01] 08/14/2015  . Insulin-dependent diabetes mellitus with renal complications ([place]) 76/73/4193  . Atrial flutter with rapid ventricular response ([place]) 08/06/2015  . Hyponatremia 07/30/2015  . Acute kidney injury ([place]) 07/30/2015  . Hyperkalemia 07/30/2015  . Acute on chronic diastolic CHF (congestive heart failure) ([place]) 07/30/2015  . Morbid obesity ([place]) 07/30/2015  . Paroxysmal atrial fibrillation ([place]) 07/30/2015  . 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 Hypertensive heart disease with heart failure ([place]) 07/14/2012  . Dyspnea 07/14/2012  . Hyperlipidemia 07/14/2012    Expected Discharge Date: Expected Discharge Date: 06/03/17  Team Members Present: Physician leading conference: Dr. Delice Lesch Social Worker Present: Ovidio Kin, LCSW Nurse Present: Isla Pence, RN PT Present: Drema Dallas Shagen, Harriet Pho, PT OT Present: Clyda Greener, OT SLP Present: Windell Moulding, SLP PPS Coordinator present : Daiva Nakayama, RN, CRRN     Current Status/Progress Goal Weekly Team Focus  Medical    Debility secondary to cardiorenal syndrome, synovitis left hand due to RA  Improve mobility, safety, endurance wound  Seeabove   Bowel/Bladder   continent of B&B last BM 06/01/17  remain continent of B&B  assess q shift and prn  Swallow/Nutrition/ Hydration             ADL's   Supervision to min assist for all bathing, dressing, and toilet transfers  supervision to min assist  selfcare retraining, balance retraining, transfer training, pt/family education, DME/AE education   Mobility   supervision to min assist w/ all mobility,  ambulation up to 200' w/ PFRW, mod assist one 6" step, still requires cues for safety   supervision to min assist, upgraded 3/18  discharge planning and family education    Communication             Safety/Cognition/ Behavioral Observations            Pain   pt denies pain/discomfort  pain will be <=3  assess q shift and prn   Skin   L wrist fistula; R chest HD cath, edema BLE, LUE; non pressure wound to buttock- hydrotherapy q day; santyl, moist to dry gauze and foam q day  free from infection and further breakdown  assess q shift and PRN; Gerhardts butt cream       *See Care Plan and progress notes for long and short-term goals.     Barriers to Discharge  Current Status/Progress Possible Resolutions Date Resolved   Physician    Medical stability;Other (comments);Hemodialysis;Decreased caregiver support;Lack of/limited family support  RA  See above  Therapies, Surgery consult for wound      Nursing                  PT  Inaccessible home environment;Home environment access/layout;Decreased caregiver support;Lack of/limited family support;Wound Care  split level home, wife works during the day, has unstagable pressure ulcer to sacrum   pt planning to enter basement and remain there until he can safely negotiate steps (level entry to basement)            OT                  SLP                SW                Discharge Planning/Teaching Needs:  Wife coming in for family training today with therpaies. Wound issue and hydrotherapy started yesterday-question medically ready for DC tomorrow      Team Discussion:  Progressing toward his team therapy goals-family training with wife today. WOC-RN recommended surgical consult for sacrum wound. Discharge placed on hold for tomorrow. Steroid taper and awaiting surgical to see and await recommendations.  Revisions to Treatment Plan:  DC on hold for medical issues    Continued Need for Acute Rehabilitation Level of Care: The patient  requires daily medical management by a physician with specialized training in physical medicine and rehabilitation for the following conditions: Daily direction of a multidisciplinary physical rehabilitation program to ensure safe treatment while eliciting the highest outcome that is of practical value to the patient.: Yes Daily medical management of patient stability for increased activity during participation in an intensive rehabilitation regime.: Yes Daily analysis of laboratory values and/or radiology reports with any subsequent need for medication adjustment of medical intervention for : Diabetes problems;Wound care problems;Blood pressure problems;Other  Elease Hashimoto 06/02/2017, 3:18 PM

## 2017-06-02 NOTE — Progress Notes (Signed)
Discussed with Dr Martinique- OK to hold Eliquis as needed for proposed surgery. Pt has a history of PAF, LBBB, diastolic CHF, ESRD on HD. He has no history of CAD or MI. His last EF was 55-60% by echo Feb 2019. We'll be available for any cardiac issues.  Kerin Ransom PA-C 06/02/2017 3:09 PM

## 2017-06-02 NOTE — Plan of Care (Signed)
  Progressing Consults RH GENERAL PATIENT EDUCATION Description See Patient Education module for education specifics. 06/02/2017 1630 - Progressing by Claude Manges, LPN Skin Care Protocol Initiated - if Braden Score 18 or less Description If consults are not indicated, leave blank or document N/A 06/02/2017 1630 - Progressing by Claude Manges, LPN Diabetes Guidelines if Diabetic/Glucose > 140 Description If diabetic or lab glucose is > 140 mg/dl - Initiate Diabetes/Hyperglycemia Guidelines & Document Interventions  06/02/2017 1630 - Progressing by Claude Manges, LPN RH SKIN INTEGRITY RH STG ABLE TO PERFORM INCISION/WOUND CARE W/ASSISTANCE Description STG Able To Perform Incision/Wound Care With mod Assistance.  06/02/2017 1630 - Progressing by Claude Manges, LPN RH PAIN MANAGEMENT RH STG PAIN MANAGED AT OR BELOW PT'S PAIN GOAL Description < 5  06/02/2017 1630 - Progressing by Claude Manges, LPN   Not Progressing RH SKIN INTEGRITY RH STG SKIN FREE OF INFECTION/BREAKDOWN Description Patients skin will remain free from further breakdown or infection with mod assist.  06/02/2017 1630 - Not Progressing by Claude Manges, LPN RH STG MAINTAIN SKIN INTEGRITY WITH ASSISTANCE Description STG Maintain Skin Integrity With mod Assistance.  06/02/2017 1630 - Not Progressing by Claude Manges, LPN Wound not progressing, WOC nurse and PA aware

## 2017-06-02 NOTE — Progress Notes (Signed)
Per Jackson Latino PA, Elequis d/c'd and will need to be restarted after procedure Friday. Dan PA aware.Claude Manges, LPN

## 2017-06-02 NOTE — Discharge Summary (Signed)
Discharge summary job [id number]

## 2017-06-02 NOTE — Consult Note (Addendum)
WOC follow-up: Discussed plan of care with physical therapy via phone call after hydrotherapy, they state the pressure injury has evolved to significant depth and there is a large amt strong odor and tan drainage.  Recommend surgical consult and possible transfer back to the inpatient setting for additional treatment of the unstageable necrotic wound to his sacrum.  Arthor Captain, rehab PA, to discuss need for surgical team input.  Rehab plans to discuss patient during a multidisciplinary meeting this afternoon and determine plan of care.  [name] Girt MSN, RN, [place], [place], [place]

## 2017-06-02 NOTE — Progress Notes (Signed)
Social Work Patient ID: John Bailey, male   DOB: 31-Aug-1970, 47 y.o.   MRN: [id number]  Met with pt he is aware of his discharge being placed on hold due to surgical consult pending. Aware of team's goals and meeting these and education being completed with wife today. Will await recommendation and let [facility] know of hold on discharge.

## 2017-06-02 NOTE — Consult Note (Signed)
[facility] Surgery Consult/Admission Note  John Bailey March 28, 1970  [id number].    Requesting MD: Lauraine Rinne, PA-C Chief Complaint/Reason for Consult: sacral wound  HPI:   Pt is a 47 yo old male This is a 47 year old right-handed male with history of rheumatoid arthritis, on prednisone; PVD; DM; atrial flutter, maintained on Xarelto; chronic renal insufficiency.  Lives with spouse. Presented on 04/22/2017, with increasing weight gain, SOB, O2 sats in the 80s.  Creatinine 2.65.  Troponin negative.  Chest x-ray showed interstitial pulmonary edema. Echocardiogram with ejection fraction of 60%.  Severe LVH.  Dialysis initiated. Underwent cardioversion on February15, 2019, maintained on heparin, transitioned to Eliquis. The patient was admitted for comprehensive rehab program. We were asked to see regarding a sacral wound. Pt states he did not have this wound prior to admission. He states the bed pad was between his butt cheeks and when he was pulled up in the bed he felt pain. He thinks it started from that. He denies pain in this area currently. He states it smells foul. He has no associated symptoms. He denies fever, chills, nausea or vomiting.    ROS:  Review of Systems  Constitutional: Negative for chills, diaphoresis and fever.  HENT: Negative for sore throat.   Respiratory: Negative for cough.   Cardiovascular: Negative for chest pain.  Gastrointestinal: Negative for abdominal pain, constipation, diarrhea, nausea and vomiting.  Genitourinary: Negative for dysuria.       Sacral wound  Skin: Negative for rash.  Neurological: Negative for dizziness and loss of consciousness.  All other systems reviewed and are negative.    Family History  Problem Relation Age of Onset  . Heart disease Mother   . Hyperlipidemia Father   . Hypertension Father     Past Medical History:  Diagnosis Date  . Atrial flutter with rapid ventricular response ([place]) 08/06/2015   a. s/p DCCV in  07/2015 with recurrent atrial fibrillation and DCCV in 12/2016. Initially successful but noted to be back in atrial fibrillation within 2 weeks of DCCV.   [name] Chronic diastolic (congestive) heart failure (HCC)    a. EF 50-55% by echo in 06/2016.  [name] Chronic kidney disease   . Diabetes ([place])   . Dysrhythmia    Aflutter  . Hypertension   . Peripheral vascular disease ([place])   . Rheumatoid arthritis [facility])     Past Surgical History:  Procedure Laterality Date  . AV FISTULA PLACEMENT Left 05/10/2017   Procedure: CREATION of Left Radicephalic Fistula;  Surgeon: Rosetta Posner, MD;  Location: [place];  Service: Vascular;  Laterality: Left;  . CARDIOVERSION N/A 08/01/2015   Procedure: CARDIOVERSION;  Surgeon: Josue Hector, MD;  Location[facility] ENDOSCOPY;  Service: Cardiovascular;  Laterality: N/A;  . CARDIOVERSION N/A 12/18/2016   Procedure: CARDIOVERSION;  Surgeon: [name], MD;  Location: [facility] ENDOSCOPY;  Service: Cardiovascular;  Laterality: N/A;  . CARDIOVERSION N/A 04/30/2017   Procedure: CARDIOVERSION;  Surgeon: Larey Dresser, MD;  Location: [facility] ENDOSCOPY;  Service: Cardiovascular;  Laterality: N/A;  . CHOLECYSTECTOMY    . INSERTION OF DIALYSIS CATHETER Right 05/04/2017   Procedure: INSERTION OF TUNNELED  DIALYSIS CATHETER;  Surgeon: Conrad [place], MD;  Location: [place];  Service: Vascular;  Laterality: Right;  . IR FLUORO GUIDE CV LINE RIGHT  04/27/2017  . IR US GUIDE VASC ACCESS RIGHT  04/27/2017  . TEE WITHOUT CARDIOVERSION N/A 08/01/2015   Procedure: TRANSESOPHAGEAL ECHOCARDIOGRAM (TEE);  Surgeon: Josue Hector, MD;  Location: [facility]  ENDOSCOPY;  Service: Cardiovascular;  Laterality: N/A;    Social History:  reports that  has never smoked. he has never used smokeless tobacco. He reports that he does not drink alcohol or use drugs.  Allergies: No Known Allergies  Medications Prior to Admission  Medication Sig Dispense Refill  . acetaminophen (TYLENOL) 325 MG tablet Take 650 mg by mouth  every 6 (six) hours as needed for mild pain.    [name] albuterol (PROVENTIL HFA;VENTOLIN HFA) 108 (90 BASE) MCG/ACT inhaler Inhale 1-2 puffs into the lungs every 6 (six) hours as needed for wheezing. 1 Inhaler 3  . amiodarone (PACERONE) 200 MG tablet Take 1 tablet (200 mg total) by mouth 2 (two) times daily. 60 tablet 0  . apixaban (ELIQUIS) 5 MG TABS tablet Take 1 tablet (5 mg total) by mouth 2 (two) times daily. 60 tablet 0  . carvedilol (COREG) 6.25 MG tablet Take 1 tablet (6.25 mg total) by mouth 2 (two) times daily with a meal. 30 tablet 0  . diltiazem (CARDIZEM CD) 360 MG 24 hr capsule Take 360 mg by mouth daily.  0  . fluticasone (FLONASE) 50 MCG/ACT nasal spray Place 2 sprays into both nostrils daily as needed for allergies.     [name] gabapentin (NEURONTIN) 300 MG capsule Take 1 capsule (300 mg total) by mouth daily. 30 capsule 0  . insulin aspart (NOVOLOG) 100 UNIT/ML injection Inject 3 Units into the skin 3 (three) times daily with meals. (Patient taking differently: Inject 3 Units into the skin 3 (three) times daily with meals. Per sliding scale as directed by physician) 10 mL 11  . Insulin Glargine (LANTUS) 100 UNIT/ML Solostar Pen Inject 25 Units into the skin daily at 10 pm. 15 mL 11  . Insulin Pen Needle 31G X 6 MM MISC 1 Device by Does not apply route 2 (two) times daily. 60 each 3  . methocarbamol (ROBAXIN) 500 MG tablet Take 1 tablet (500 mg total) by mouth every 8 (eight) hours as needed for muscle spasms. 30 tablet 0  . calcitRIOL (ROCALTROL) 0.5 MCG capsule Take 1 capsule (0.5 mcg total) by mouth every other day. (Patient not taking: Reported on 05/14/2017) 30 capsule 0  . colchicine 0.6 MG tablet Take 0.5 tablets (0.3 mg total) by mouth daily. (Patient not taking: Reported on 05/14/2017) 30 tablet 0  . predniSONE (DELTASONE) 10 MG tablet Take 1 tablet (10 mg total) by mouth daily with breakfast. (Patient not taking: Reported on 05/14/2017) 30 tablet 0    Blood pressure 136/86, pulse 86,  temperature 97.7 F (36.5 C), temperature source Oral, resp. rate 18, height 6' (1.829 m), weight 273 lb 5.9 oz (124 kg), SpO2 100 %.  Physical Exam  Constitutional: He is oriented to person, place, and time and well-developed, well-nourished, and in no distress. No distress.  Obese AA male  HENT:  Head: Normocephalic and atraumatic.  Nose: Nose normal.  Mouth/Throat: Mucous membranes are normal.  Eyes: Conjunctivae are normal. Right eye exhibits no discharge. Left eye exhibits no discharge. No scleral icterus.  Pupils are equal and round  Neck: Normal range of motion. Neck supple. No thyromegaly present.  Cardiovascular: Normal rate, regular rhythm, normal heart sounds and intact distal pulses.  No murmur heard. Pulmonary/Chest: Effort normal and breath sounds normal. No respiratory distress. He has no wheezes. He has no rhonchi. He has no rales.  Abdominal: Soft. Bowel sounds are normal. He exhibits no distension. There is no hepatosplenomegaly. There is no tenderness. There is  no rigidity and no guarding.  Genitourinary:  Genitourinary Comments: Sacral wound roughly 4x3.5cm and 4cm deep with foul odor and necrotic tissue, see photo below  Musculoskeletal: Normal range of motion. He exhibits no tenderness.  Lymphadenopathy:    He has no cervical adenopathy.  Neurological: He is alert and oriented to person, place, and time.  Skin: Skin is warm and dry. No rash noted. He is not diaphoretic.  Psychiatric: Mood and affect normal.  Nursing note and vitals reviewed.      Results for orders placed or performed during the hospital encounter of 05/13/17 (from the past 48 hour(s))  Glucose, capillary     Status: Abnormal   Collection Time: 05/31/17  5:11 PM  Result Value Ref Range   Glucose-Capillary 223 (H) 65 - 99 mg/dL  Glucose, capillary     Status: Abnormal   Collection Time: 05/31/17  9:36 PM  Result Value Ref Range   Glucose-Capillary 390 (H) 65 - 99 mg/dL   Comment 1 Notify  RN   Glucose, capillary     Status: Abnormal   Collection Time: 06/01/17  6:40 AM  Result Value Ref Range   Glucose-Capillary 279 (H) 65 - 99 mg/dL   Comment 1 Notify RN   Glucose, capillary     Status: Abnormal   Collection Time: 06/01/17 11:30 AM  Result Value Ref Range   Glucose-Capillary 137 (H) 65 - 99 mg/dL  CBC     Status: Abnormal   Collection Time: 06/01/17  5:08 PM  Result Value Ref Range   WBC 23.6 (H) 4.0 - 10.5 K/uL   RBC 3.38 (L) 4.22 - 5.81 MIL/uL   Hemoglobin 9.4 (L) 13.0 - 17.0 g/dL   HCT 29.2 (L) 39.0 - 52.0 %   MCV 86.4 78.0 - 100.0 fL   MCH 27.8 26.0 - 34.0 pg   MCHC 32.2 30.0 - 36.0 g/dL   RDW 23.1 (H) 11.5 - 15.5 %   Platelets 493 (H) 150 - 400 K/uL    Comment: Performed at [facility], [address], [place], [place] [address]  Renal function panel     Status: Abnormal   Collection Time: 06/01/17  5:08 PM  Result Value Ref Range   Sodium 134 (L) 135 - 145 mmol/L   Potassium 3.6 3.5 - 5.1 mmol/L   Chloride 96 (L) 101 - 111 mmol/L   CO2 23 22 - 32 mmol/L   Glucose, Bld 111 (H) 65 - 99 mg/dL   BUN 128 (H) 6 - 20 mg/dL   Creatinine, Ser 7.22 (H) 0.61 - 1.24 mg/dL   Calcium 9.2 8.9 - 10.3 mg/dL   Phosphorus 4.4 2.5 - 4.6 mg/dL   Albumin 2.8 (L) 3.5 - 5.0 g/dL   GFR calc non Af Amer 8 (L) >60 mL/min   GFR calc Af Amer 10 (L) >60 mL/min    Comment: (NOTE) The eGFR has been calculated using the CKD EPI equation. This calculation has not been validated in all clinical situations. eGFR's persistently <60 mL/min signify possible Chronic Kidney Disease.    Anion gap 15 5 - 15    Comment: Performed at [facility] [address], [place], [place] [address]  Glucose, capillary     Status: Abnormal   Collection Time: 06/01/17  8:40 PM  Result Value Ref Range   Glucose-Capillary 119 (H) 65 - 99 mg/dL  Glucose, capillary     Status: Abnormal   Collection Time: 06/02/17  6:43 AM  Result Value Ref Range   Glucose-Capillary 195 (H) 65 - 99 mg/dL    Glucose, capillary     Status: Abnormal   Collection Time: 06/02/17 12:16 PM  Result Value Ref Range   Glucose-Capillary 176 (H) 65 - 99 mg/dL   No results found.    Assessment/Plan Active Problems:   Debility   Cardiorenal syndrome with renal failure   Pain   Leukocytosis   PAF (paroxysmal atrial fibrillation) (HCC)   Acute on chronic diastolic (congestive) heart failure (HCC)   Type 2 diabetes mellitus with peripheral neuropathy (HCC)   Acute blood loss anemia   Anemia of chronic disease   ESRD on dialysis (HCC)   Carpal tunnel syndrome of left wrist   Labile blood pressure   Labile blood glucose   HSV (herpes simplex virus) infection   Neuropathic pain   Constipation   Steroid-induced hyperglycemia  Sacral wound - would benefit from debridement - will need to hold Eliquis  - possible OR Friday but will need cardiac clearance   We will follow for surgical readiness. Thank you for the consult.   Kalman Drape, [facility] Surgery 06/02/2017, 2:28 PM Pager: [phone] Consults: [phone] Mon-Fri 7:00 am-4:30 pm Sat-Sun 7:00 am-11:30 am

## 2017-06-02 NOTE — Progress Notes (Signed)
Physical Therapy Discharge Summary  Patient Details  Name: John Bailey MRN: [id number] Date of Birth: 1970-07-16  Patient has met 7 of 7 long term goals due to improved activity tolerance, improved balance, increased strength, decreased pain and ability to compensate for deficits.  Patient to discharge at an ambulatory level Supervision.   Patient's care partner is independent to provide the necessary supervision assistance at discharge and occasional min assist for sit<>stands from various surface heights, wife has completed family training.  All Goals met.   Recommendation:  Patient will benefit from ongoing skilled PT services in home health setting to continue to advance safe functional mobility, address ongoing impairments in balance, strength, ROM, mobility, and minimize fall risk.  Equipment: w/c with roho cushion, L platform RW  Reasons for discharge: treatment goals met  Patient/family agrees with progress made and goals achieved: Yes  PT Discharge Precautions/Restrictions Precautions Precautions: Fall Required Braces or Orthoses: Other Brace/Splint Other Brace/Splint: R ankle brace Restrictions Weight Bearing Restrictions: No Pain Pain Assessment Pain Assessment: 0-10 Pain Score: 0-No pain Cognition Overall Cognitive Status: Within Functional Limits for tasks assessed Arousal/Alertness: Awake/alert Orientation Level: Oriented X4 Selective Attention: Appears intact Memory: Appears intact Awareness: Appears intact Problem Solving: Appears intact Safety/Judgment: Appears intact Sensation Sensation Light Touch: Impaired Detail(impaired B LEs secondary to neuropathy, reports tingling in toes) Proprioception: Appears Intact Coordination Gross Motor Movements are Fluid and Coordinated: Yes Fine Motor Movements are Fluid and Coordinated: Yes Coordination and Movement Description: grossly intact coordination B LEs Motor  Motor Motor: Other (comment) Motor -  Discharge Observations: generalized weakness  Trunk/Postural Assessment  Cervical Assessment Cervical Assessment: Within Functional Limits Thoracic Assessment Thoracic Assessment: Exceptions to WFL(rounded shoulders) Lumbar Assessment Lumbar Assessment: Exceptions to WFL(posterior pelvic tilt preference) Postural Control Postural Control: Within Functional Limits  Balance Balance Balance Assessed: Yes Static Sitting Balance Static Sitting - Level of Assistance: 6: Modified independent (Device/Increase time) Dynamic Sitting Balance Dynamic Sitting - Level of Assistance: 6: Modified independent (Device/Increase time) Static Standing Balance Static Standing - Level of Assistance: 5: Stand by assistance Dynamic Standing Balance Dynamic Standing - Level of Assistance: 5: Stand by assistance Extremity Assessment  RLE Assessment RLE Assessment: Exceptions to WFL(strength grossly 4/5 throughout, limited ankle ROM to neutral ) LLE Assessment LLE Assessment: Exceptions to WFL(strength grossly 4/5 throughout, limited ankle ROM to neutral )   See Function Navigator for Current Functional Status.  Netta Corrigan, PT, DPT 06/07/2017, 3:29 PM

## 2017-06-02 NOTE — Discharge Summary (Deleted)
John Bailey, John Bailey               ACCOUNT NO.:  [id number]  MEDICAL RECORD NO.:  [id number]  LOCATION:  4M08C                        FACILITY:  [facility]  PHYSICIAN:  Delice Lesch, MD        DATE OF BIRTH:  17-Mar-1970  DATE OF ADMISSION:  05/13/2017 DATE OF DISCHARGE:  06/03/2017                              DISCHARGE SUMMARY   DISCHARGE DIAGNOSES: 1. Debility secondary to cardiorenal syndrome. 2. Deep vein thrombosis prophylaxis with Eliquis. 3. End-stage renal disease, on hemodialysis. 4. Acute on chronic anemia. 5. Steroid-induced hyperglycemia with diabetes mellitus. 6. Acute on chronic diastolic congestive heart failure. 7. Rheumatoid arthritis. 8. Atrial fibrillation with rapid ventricular response. 9. Labile hypertension. 10.Constipation. 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 12.Sacral coccyx pressure ulcer.  HISTORY OF PRESENT ILLNESS:  This is a 47 year old right-handed male with history of rheumatoid arthritis, on chronic prednisone; peripheral vascular disease; lower extremity ischemic changes; diabetes mellitus; chronic fluid overload; atrial fibrillation, maintained on Xarelto. Lives with spouse.  Used a cane prior to admission.  Presented on April 22, 2017, with increasing weight gain as well as shortness of breath, difficulty with ambulation, oxygen saturation in the 80s.  Lasix recently adjusted.  Creatinine 2.65.  Troponin negative.  Chest x-ray showed interstitial pulmonary edema.  Renal ultrasound, no hydronephrosis.  Echocardiogram with ejection fraction of 60%.  Severe LVH.  Placed on intravenous Lasix.  Renal Services consulted.  Dialysis initiated.  Underwent placement of right IJ dialysis tunneled catheter, transitioned to permanent dialysis Tuesday, Thursday, and Saturday with left AVF placed.  Cardiology Services followed for atrial fibrillation. Underwent cardioversion on April 30, 2017.  Initially on intravenous heparin, transitioned to Eliquis.   Physical and occupational therapy ongoing.  The patient was admitted for a comprehensive rehab program.  PAST MEDICAL HISTORY:  See discharge diagnoses.  SOCIAL HISTORY:  Lives with spouse.  Used a cane prior to admission.  FUNCTIONAL STATUS UPON ADMISSION TO REHAB SERVICES:  Moderate assist, stand pivot transfers, +2 physical assist, squat pivot transfers, max total assist activities of daily living.  PHYSICAL EXAMINATION:  VITAL SIGNS: Blood pressure 119/72, pulse 79, temperature 99, respirations 18. GENERAL: Alert, obese male, in no acute distress. HEENT: EOMs intact. NECK: Supple.  Nontender.  No JVD. CARDIAC: Rate controlled. ABDOMEN: Obese, soft, nontender.  Good bowel sounds. LUNGS: Clear to auscultation without wheeze. EXTREMITIES: Ischemic changes in lower extremities.  Tenderness to palpation at left PIP and left 2nd and 3rd MCP joints.  REHABILITATION HOSPITAL COURSE:  The patient was admitted to Inpatient Rehab Services.  Therapies initiated on a 3-hour daily basis, consisting of physical therapy, occupational therapy, and rehabilitation nursing. The following issues were addressed during the patient's rehabilitation stay.  Pertaining to John Bailey debility secondary to cardiorenal syndrome, he continued to participate with therapies, working with energy conservation techniques.  He remained on Eliquis for atrial fibrillation.  Followed by Cardiology Services.  No chest pain or shortness of breath.  Hemodialysis ongoing as per Renal Services.     Lauraine Rinne, P.A.   ______________________________ Delice Lesch, MD    DA/MEDQ  D:  06/02/2017  T:  06/02/2017  Job:  [id number]  cc:   Louis Meckel, M.D.  Gala Romney, MD Delice Lesch, MD Lyman Bishop, MD

## 2017-06-02 NOTE — Progress Notes (Signed)
Assessment/Plan:  1. New ESRD2/2 DM/HTN. TTS at [facility] (but wants MWF).Poss DC 3/21. Next HD Today, needs lower weight 2. AnemiaDarbeand iron 3. HPTHcalcitriol QOD -and binders 4. Afib on amio/apixaban 5. HTN/vol- Still tryingestablish an EDW 8. RA - pred taper + topicals  Subjective: Interval History: -2560 cc off with HD yesterday  Objective: Vital signs in last 24 hours: Temp:  [97.5 F (36.4 C)-97.7 F (36.5 C)] 97.7 F (36.5 C) (03/20 0237) Pulse Rate:  [65-91] 86 (03/20 0237) Resp:  [18-19] 18 (03/20 0237) BP: (92-157)/(33-95) 136/86 (03/20 0237) SpO2:  [98 %-100 %] 100 % (03/20 0237) Weight:  [123.7 kg (272 lb 11.3 oz)-127.7 kg (281 lb 8.4 oz)] 124 kg (273 lb 5.9 oz) (03/20 0237) Weight change: 4.7 kg (10 lb 5.8 oz)  Intake/Output from previous day: 03/19 0701 - 03/20 0700 In: 120 [P.O.:120] Out: 2590  Intake/Output this shift: Total I/O In: 240 [P.O.:240] Out: -   General appearance: alert and cooperative Back: symmetric, no curvature. ROM normal. No CVA tenderness. Resp: clear to auscultation bilaterally Extremities: edema 2+, but improved  Lab Results: Recent Labs    06/01/17 1708  WBC 23.6*  HGB 9.4*  HCT 29.2*  PLT 493*   BMET:  Recent Labs    06/01/17 1708  NA 134*  K 3.6  CL 96*  CO2 23  GLUCOSE 111*  BUN 128*  CREATININE 7.22*  CALCIUM 9.2   No results for input(s): PTH in the last 72 hours. Iron Studies: No results for input(s): IRON, TIBC, TRANSFERRIN, FERRITIN in the last 72 hours. Studies/Results: No results found.  Scheduled: . Abatacept  125 mg Subcutaneous Q Tue  . amiodarone  200 mg Oral Daily  . apixaban  5 mg Oral BID  . calcitRIOL  0.5 mcg Oral Q T,Th,Sa-HD  . calcium acetate  1,334 mg Oral TID WC  . capsaicin   Topical BID  . collagenase   Topical Daily  . [START ON 06/03/2017] darbepoetin (ARANESP) injection - DIALYSIS  150 mcg Intravenous Q Thu-HD  . feeding supplement (NEPRO CARB STEADY)  237 mL  Oral BID BM  . feeding supplement (PRO-STAT SUGAR FREE 64)  30 mL Oral BID  . fluticasone  2 spray Each Nare Daily  . gabapentin  300 mg Oral Daily  . Gerhardt's butt cream   Topical BID  . insulin aspart  0-15 Units Subcutaneous TID WC  . insulin aspart  10 Units Subcutaneous TID WC  . insulin glargine  30 Units Subcutaneous QHS  . lidocaine  3 patch Transdermal Q24H  . multivitamin  1 tablet Oral QHS  . predniSONE  40 mg Oral Q breakfast   Followed by  . [START ON 06/09/2017] predniSONE  30 mg Oral Q breakfast   Followed by  . [START ON 06/16/2017] predniSONE  20 mg Oral Q breakfast   Followed by  . [START ON 06/23/2017] predniSONE  10 mg Oral Q breakfast  . senna-docusate  2 tablet Oral QHS  . simethicone  80 mg Oral QID     LOS: 20 days   Estanislado Emms 06/02/2017,1:05 PM

## 2017-06-02 NOTE — Progress Notes (Signed)
Physical Therapy Wound Treatment Patient Details  Name: GRADEN HOSHINO MRN: [id number] Date of Birth: [birth year]  Today's Date: 06/02/2017 PT Individual Time: 1105-1208 PT Individual Time Calculation (min): 63 min   Subjective  Subjective: Pt states he is tired of how his wound stinks Patient and Family Stated Goals: heal wound decrease odor  Pain Score: Pt premedicated prior to treatment. Pt reports 4/10 pain with treatment  Wound Assessment  Pressure Injury 05/28/17 Unstageable - Full thickness tissue loss in which the base of the ulcer is covered by slough (yellow, tan, gray, green or brown) and/or eschar (tan, brown or black) in the wound bed. Central gluteal fold (Active)  Dressing Type ABD;Barrier Film (skin prep);Gauze (Comment);Moist to dry;Other (Comment) 06/02/2017 11:00 AM  Dressing Changed 06/02/2017 11:00 AM  Dressing Change Frequency Daily 06/02/2017 11:00 AM  State of Healing Non-healing 06/02/2017 11:00 AM  Site / Wound Assessment Yellow;Brown 06/02/2017 11:00 AM  % Wound base Red or Granulating 90% 06/02/2017 11:00 AM  % Wound base Yellow/Fibrinous Exudate 10% 06/02/2017 11:00 AM  Peri-wound Assessment Erythema (blanchable);Maceration;Pink;Denuded 06/02/2017 11:00 AM  Wound Length (cm) 8.5 cm 06/02/2017 11:00 AM  Wound Width (cm) 5 cm 06/02/2017 11:00 AM  Wound Depth (cm) 3 cm 06/02/2017 11:00 AM  Wound Surface Area (cm^2) 42.5 cm^2 06/02/2017 11:00 AM  Wound Volume (cm^3) 127.5 cm^3 06/02/2017 11:00 AM  Tunneling (cm) 6 cm at 12 o'clock 06/02/2017 11:00 AM  Margins Unattached edges (unapproximated) 06/02/2017 11:00 AM  Drainage Amount Minimal 06/02/2017 11:00 AM  Drainage Description Purulent;Odor 06/02/2017 11:00 AM  Treatment Cleansed;Debridement (Selective);Hydrotherapy (Pulse lavage);Packing (Saline gauze) 06/02/2017 11:00 AM     Wound / Incision (Open or Dehisced) 05/28/17 Non-pressure wound Sacrum Right;Left;Medial R buttock (Active)  Wound Image   06/02/2017 11:00 AM    Dressing Type ABD;Barrier Film (skin prep);Gauze (Comment);Moist to dry;Other (Comment) 06/02/2017 11:00 AM  Dressing Changed New 06/02/2017 11:00 AM  Dressing Status Clean;Dry;Intact 06/02/2017 11:00 AM  Dressing Change Frequency Daily 06/02/2017 11:00 AM  Site / Wound Assessment Yellow;Painful 06/02/2017 11:00 AM  % Wound base Yellow/Fibrinous Exudate 100% 06/02/2017 11:00 AM  Peri-wound Assessment Denuded;Pink 06/02/2017 11:00 AM  Wound Length (cm) 3.5 cm 06/02/2017 11:00 AM  Wound Width (cm) 2.5 cm 06/02/2017 11:00 AM  Wound Surface Area (cm^2) 8.75 cm^2 06/02/2017 11:00 AM  Margins Unattached edges (unapproximated) 06/02/2017 11:00 AM  Closure None 06/02/2017 11:00 AM  Drainage Amount Moderate 06/02/2017 11:00 AM  Drainage Description Purulent;Odor 06/02/2017 11:00 AM  Treatment Cleansed;Debridement (Selective);Hydrotherapy (Pulse lavage);Packing (Saline gauze) 06/02/2017 11:00 AM     Incision (Closed) 05/10/17 Arm Left (Active)  Dressing Type None 06/02/2017  7:45 AM  Dressing Clean;Dry 05/28/2017  2:25 PM  Site / Wound Assessment Dry;Clean 06/02/2017  7:45 AM  Margins Attached edges (approximated) 05/27/2017  1:10 PM  Closure Skin glue 06/02/2017  7:45 AM  Drainage Amount None 05/28/2017  2:25 PM   Hydrotherapy Pulsed lavage therapy - wound location: sacrum Pulsed Lavage with Suction (psi): 4 psi(4-12) Pulsed Lavage with Suction - Normal Saline Used: 1000 mL Pulsed Lavage Tip: Tip with splash shield Selective Debridement Selective Debridement - Location: sacrum  Selective Debridement - Tools Used: Forceps;Scissors Selective Debridement - Tissue Removed: brown, gray necrotic tissue.    Wound Assessment and Plan  Wound Therapy - Assess/Plan/Recommendations Wound Therapy - Clinical Statement: With removal of upper level of hard slough, able to more completely evaluate the wound which has a floor at least 3 cm deep and tunneling at 12 o;clock of at  least 6 cm. With debridement increasingly foul  odor and thin yellow drainage occurred. PT recommends surgical consult for possible surgical debridement.  Factors Delaying/Impairing Wound Healing: Immobility;Multiple medical problems;Diabetes Mellitus Hydrotherapy Plan: Debridement;Patient/family education;Pulsatile lavage with suction Wound Therapy - Frequency: 6X / week Wound Therapy - Current Recommendations: Surgery consult;WOC nurse Wound Therapy - Follow Up Recommendations: Other (comment) Wound Plan: see above  Wound Therapy Goals- Improve the function of patient's integumentary system by progressing the wound(s) through the phases of wound healing (inflammation - proliferation - remodeling) by: Decrease Necrotic Tissue to: 50 Decrease Necrotic Tissue - Progress: Not met Increase Granulation Tissue to: 50 Increase Granulation Tissue - Progress: Not met Goals/treatment plan/discharge plan were made with and agreed upon by patient/family: Yes Time For Goal Achievement: 7 days Wound Therapy - Potential for Goals: Fair  Goals will be updated until maximal potential achieved or discharge criteria met.  Discharge criteria: when goals achieved, discharge from hospital, MD decision/surgical intervention, no progress towards goals, refusal/missing three consecutive treatments without notification or medical reason.  GP    Dani Gobble. [name] PT, DPT Acute Rehabilitation  [phone] Pager [phone]   [place] 06/02/2017, 2:22 PM

## 2017-06-02 NOTE — Discharge Summary (Signed)
John Bailey, John Bailey               ACCOUNT NO.:  [id number]  MEDICAL RECORD NO.:  [id number]  LOCATION:  4M08C                        FACILITY:  [facility]  PHYSICIAN:  Delice Lesch, MD        DATE OF BIRTH:  1970-07-28  DATE OF ADMISSION:  05/13/2017 DATE OF DISCHARGE:  06/08/2017                              DISCHARGE SUMMARY   DISCHARGE DIAGNOSES: 1. Debility secondary to cardiorenal syndrome. 2. Deep venous thrombosis prophylaxis with Eliquis and pain     management. 3. End-stage renal disease. 4. Acute on chronic anemia. 5. Diabetes mellitus. 6. Peripheral neuropathy. 7. Acute on chronic diastolic congestive heart failure. 8. Rheumatoid arthritis. 9. Atrial fibrillation with rapid ventricular response. 10.Sacral and coccyx pressure ulcer.  HISTORY OF PRESENT ILLNESS:  This is a 47 year old right-handed male with history of rheumatoid arthritis, maintained on prednisone; peripheral vascular disease; diabetes mellitus; atrial flutter, maintained on Xarelto; chronic renal insufficiency.  Lives with spouse. Used a cane prior to admission.  Presented on April 22, 2017, with increasing weight gain, shortness of breath, oxygen saturation in the 80s.  Creatinine 2.65.  Troponin negative.  Chest x-ray showed interstitial pulmonary edema.  Renal ultrasound negative for hydronephrosis.  Echocardiogram with ejection fraction of 60%.  Severe LVH.  Placed on intravenous Lasix.  Renal Services consulted.  Dialysis initiated.  Placement of left AV graft for chronic dialysis.  Cardiology consulted for atrial fibrillation.  Underwent cardioversion on April 30, 2017, maintained on heparin, transitioned to Eliquis.  Physical and occupational therapy ongoing.  The patient was admitted for comprehensive rehab program.  PAST MEDICAL HISTORY:  See discharge diagnoses.  SOCIAL HISTORY:  Lives with spouse.  Used a cane prior to admission.  FUNCTIONAL STATUS UPON ADMISSION TO REHAB SERVICES:  +2  physical assist, squat pivot transfers, moderate assist stand pivot transfers, max total assist activities of daily living.  PHYSICAL EXAMINATION:  VITAL SIGNS:  Blood pressure 119/72 pulse 79, temperature 99, respirations 18. GENERAL:  Alert male, obese, in no acute distress. HEENT:  EOMs intact. NECK:  Supple.  Nontender.  No JVD. CARDIAC:  Rate controlled. ABDOMEN:  Soft, nontender.  Good bowel sounds. LUNGS:  Clear to auscultation without wheeze. EXTREMITIES:  Ischemic changes lower extremities.  Tenderness to palpation at left PIP joint and left 2nd and 3rd MCP joint.  REHABILITATION HOSPITAL COURSE:  The patient was admitted to Inpatient Rehab Services.  Therapies initiated on a 3-hour daily basis, consisting of physical therapy, occupational therapy, and rehabilitation nursing. The following issues were addressed during the patient's rehabilitation stay.  Pertaining to Mr. Mccombie debility secondary to cardiorenal syndrome, he was attending therapies, working on energy conservation techniques.  He remained on Eliquis for atrial fibrillation.  Cardiac rate controlled.  No chest pain or shortness of breath.  Pain management.  History of rheumatoid arthritis.  He remained on Neurontin as directed.  Robaxin as needed.  Voltaren gel and a Lidoderm patch.  He was completing a course of prednisone therapy.  He remained on hemodialysis as directed per Renal Services.  Acute on chronic anemia, 8.8.  He remained on iron.  Diabetes mellitus, monitor closely while on prednisone.  Insulin therapy as directed.  He exhibited no other signs of fluid overload.  Followed by Cardiology Services for both atrial fibrillation and congestive heart failure.  Blood pressure remained somewhat labile, monitored closely on while with dialysis.  Follow up wound care nurse for sacral coccyx pressure ulcer.  He did receive a   skin care as directed and hydrotherapy initiated.  He underwent debridement of  sacral decubitus ulcer 06/04/2017 per Dr. Grandville Silos.  It was felt by general surgery the patient should remain in the hospital for a short time after recent debridement.  He had completed his rehab therapy program.  Outpatient ambulatory referral was obtained to the [facility].  The patient received weekly collaborative interdisciplinary team conferences to discuss estimated length of stay, family teaching, any barriers to his discharge.  Working with energy conservation techniques.  Provide skill assist for positioning of his rolling walker.  Ambulating at supervision up to 200 feet, doing better with a left platform rolling walker with noted history of rheumatoid arthritis.  Gather belongings for activities of daily living and homemaking again focusing on energy conservation techniques.  Directed care demonstrated bilateral upper extremities good range of motion.  He was able to transfer to the toilet with the use of a rolling walker with platform walker and minimal assistance.  He completed toileting tasks with minimal assistance as well as bathing and dressing sit to stand at the sink.  He was discharged to acute care services for ongoing management of sacral decubitus  DISCHARGE MEDICATIONS: 1. Abatacept 125 mg subcutaneous every Tuesday. 2. Amiodarone 200 mg p.o. daily. 3. Eliquis 5 mg p.o. b.i.d. 4. Rocaltrol 0.5 mcg p.o. Tuesday, Thursday, and Saturday     hemodialysis. 5. PhosLo 1334 mg t.i.d. 6. Flonase 2 sprays each nostril daily. 7. Neurontin 300 mg p.o. daily. 8. NovoLog 10 units t.i.d. 9. Lantus insulin 45 units at bedtime. 10.Lidoderm patch, three patches daily. 11.Multivitamin daily. 12.Prednisone taper as directed. 13.Robaxin 500 mg every 8 hours as needed for muscle spasms. 14.Oxycodone 1 tablet every 6 hours as needed for pain.  DIET:  His diet was a diabetic renal 1200 mL fluid restriction.  FOLLOWUP:  The patient would follow up with Dr. Delice Lesch at  the Outpatient Rehab Service office as directed; Dr. Lyman Bishop, Cardiology Service, call for appointment; Dr. Corliss Parish of Renal Services; Dr. Gala Romney, Medical Management.  He would follow up with Dr. Grandville Silos as needed after sacral decubitus debridement       Lauraine Rinne, P.A.   ______________________________ Delice Lesch, MD    DA/MEDQ  D:  06/02/2017  T:  06/02/2017  Job:  [id number]  cc:   Delice Lesch, MD Lyman Bishop, M.D. Louis Meckel, M.D. Gala Romney, M.D.

## 2017-06-02 NOTE — Progress Notes (Signed)
[place] PHYSICAL MEDICINE & REHABILITATION     PROGRESS NOTE  Subjective/Complaints:  Pt seen lying in bed this AM. He states he slept well overnight.  He states he is doing well and is looking forward to d/c tomorrow.   ROS: Denies CP, SOB, N/V/D  Objective: Vital Signs: Blood pressure 136/86, pulse 86, temperature 97.7 F (36.5 C), temperature source Oral, resp. rate 18, height 6' (1.829 m), weight 124 kg (273 lb 5.9 oz), SpO2 100 %. No results found. Recent Labs    06/01/17 1708  WBC 23.6*  HGB 9.4*  HCT 29.2*  PLT 493*   Recent Labs    06/01/17 1708  NA 134*  K 3.6  CL 96*  GLUCOSE 111*  BUN 128*  CREATININE 7.22*  CALCIUM 9.2   CBG (last 3)  Recent Labs    06/01/17 1130 06/01/17 2040 06/02/17 0643  GLUCAP 137* 119* 195*    Wt Readings from Last 3 Encounters:  06/02/17 124 kg (273 lb 5.9 oz)  05/13/17 132.1 kg (291 lb 3.6 oz)  04/02/17 (!) 154.9 kg (341 lb 9.6 oz)    Physical Exam:  BP 136/86 (BP Location: Right Arm)   Pulse 86   Temp 97.7 F (36.5 C) (Oral)   Resp 18   Ht 6' (1.829 m)   Wt 124 kg (273 lb 5.9 oz)   SpO2 100%   BMI 37.08 kg/m  Constitutional: No distress . Vital signs reviewed. HENT: Normocephalic, atraumatic Eyes: EOMI, oral membranes moist Cardiovascular:RRR. No JVD    Respiratory: CTA Bilaterally. Normal effort    GI: BS +, non-distended  Musc:  Bouchard's deformities  +Edema b/l LE Neurological: Alert and oriented.  Motor: Right upper extremity 4/5 proximal to distal  Left upper extremity: Shoulder abduction, elbow flexion/extension 4-/5, hand grip 2-/5  Bilateral lower extremities: 4-/5 proximal to distal (gradually improving)  Skin.  Lower extremity ischemic changes.   Assessment/Plan: 1. Functional deficits secondary to debility which require 3+ hours per day of interdisciplinary therapy in a comprehensive inpatient rehab setting. Physiatrist is providing close team supervision and 24 hour management of active  medical problems listed below. Physiatrist and rehab team continue to assess barriers to discharge/monitor patient progress toward functional and medical goals.  Function:  Bathing Bathing position   Position: Standing at sink  Bathing parts Body parts bathed by patient: Right arm, Left arm, Chest, Abdomen, Front perineal area, Right upper leg, Left upper leg, Right lower leg, Left lower leg, Buttocks Body parts bathed by helper: Back  Bathing assist Assist Level: Touching or steadying assistance(Pt > 75%)      Upper Body Dressing/Undressing Upper body dressing   What is the patient wearing?: Pull over shirt/dress     Pull over shirt/dress - Perfomed by patient: Thread/unthread right sleeve, Thread/unthread left sleeve, Put head through opening Pull over shirt/dress - Perfomed by helper: Pull shirt over trunk        Upper body assist Assist Level: Touching or steadying assistance(Pt > 75%)      Lower Body Dressing/Undressing Lower body dressing Lower body dressing/undressing activity did not occur: N/A What is the patient wearing?: Underwear, Pants, Shoes, AFO Underwear - Performed by patient: Thread/unthread right underwear leg, Thread/unthread left underwear leg, Pull underwear up/down Underwear - Performed by helper: Pull underwear up/down Pants- Performed by patient: Thread/unthread left pants leg, Thread/unthread right pants leg, Pull pants up/down Pants- Performed by helper: Pull pants up/down         Shoes -  Performed by patient: Don/doff right shoe, Don/doff left shoe Shoes - Performed by helper: Don/doff right shoe, Don/doff left shoe AFO - Performed by patient: Don/doff right AFO AFO - Performed by helper: Don/doff right AFO      Lower body assist Assist for lower body dressing: Touching or steadying assistance (Pt > 75%)      Toileting Toileting Toileting activity did not occur: No continent bowel/bladder event Toileting steps completed by patient: Adjust  clothing prior to toileting, Adjust clothing after toileting, Performs perineal hygiene Toileting steps completed by helper: Adjust clothing after toileting, Adjust clothing prior to toileting    Toileting assist Assist level: Touching or steadying assistance (Pt.75%)   Transfers Chair/bed transfer Chair/bed transfer activity did not occur: Refused Chair/bed transfer method: Ambulatory Chair/bed transfer assist level: Touching or steadying assistance (Pt > 75%) Chair/bed transfer assistive device: Armrests, Walker Mechanical lift: Ecologist Ambulation activity did not occur: Refused   Max distance: 20 ft Assist level: Supervision or verbal cues   Wheelchair Wheelchair activity did not occur: Refused Type: Manual Max wheelchair distance: 150 ft Assist Level: Supervision or verbal cues  Cognition Comprehension Comprehension assist level: Follows complex conversation/direction with no assist  Expression Expression assist level: Expresses complex ideas: With extra time/assistive device  Social Interaction Social Interaction assist level: Interacts appropriately with others - No medications needed.  Problem Solving Problem solving assist level: Solves basic 90% of the time/requires cueing < 10% of the time  Memory Memory assist level: Recognizes or recalls 90% of the time/requires cueing < 10% of the time    Medical Problem List and Plan: 1.Debilitysecondary to cardiorenal syndrome, synovitis left hand due to RA   Cont CIR   2. DVT Prophylaxis/Anticoagulation: Eliquis. Monitor for any bleeding episodes 3. Pain Management:Neurontin 300 mg daily, Robaxin as needed   Voltaren gel added for joint pain.   Neuropathic pain Left hand - cont gabapentin, started on capsasin cream .025%   Lidoderm patch added on 3/5   Controlled on 3/20- better since prednisone started 4. Mood:Provide emotional support 5. Neuropsych: This patientiscapable of making decisions on  hisown behalf. 6. Skin/Wound Care:Routine skin checks 7. Fluids/Electrolytes/Nutrition:Routine I&O's 8.End-stage renal disease. Hemodialysis initiated Tuesday Thursday Saturday schedule. Patient with left AVFgraft placed 05/10/2017   Appreciate nephro recs. 9.Acute on chronic anemia. Continue Aranesp.    Hb 9.4 on 3/19   Cont to monitor 10.Steroid induced hyperglycemia on Diabetes mellitus with peripheral neuropathy. Latest hemoglobin A1c 9.2. Diabetic teaching   Lantus insulin 25 units daily at bedtime, increased to 30 on 3/8, increased to 35 on 3/14, decreased to 30 given steroid taper and labile readings to avoid hypoglycemia.  Steroids being tapered   NovoLog 3 units 3 times a day, increased to 6 on 3/4, increased to 10 on 3/14.    CBG (last 3)  Recent Labs    06/01/17 1130 06/01/17 2040 06/02/17 0643  GLUCAP 137* 119* 195*   11.Acute on chronic diastolic congestive heart failure. Monitor for any signs of fluid overload. Continue Coreg 6.25mg  BID  Filed Weights   06/01/17 1455 06/01/17 1925 06/02/17 0237  Weight: 127.7 kg (281 lb 8.4 oz) 123.7 kg (272 lb 11.3 oz) 124 kg (273 lb 5.9 oz)     Volume management per nephrology.    12.Rheumatoid arthritis. Prednisone 10 mg daily. Patient had been receiving Abataceptevery Tuesday prior to admission. Added Voltaren gel. Follow-up rheumatologist Dr.Ziokowska   MRI right knee reviewed, showing OA, meniscal tear and patellar  tendinosis on right.  Per Ortho, braces PRN   DMARD resumed on 3/5    Started on high-dose 6 week steroid taper on 3/6  13.Atrial fibrillation RVR. Status post cardioversion. Amiodarone decreased to 200 mg daily 3/3 14.Leukocytosis.     afebrile   WBCs 23.7 on 3/10, likely steroid related   Hand films reviewed, suggesting RA changes   Continue to monitor   Cont to monitor 15.  Left hand numbness   Likely related to fistula placement +/- RA 16. Labile blood pressure   Vitals:   06/01/17 2045 06/02/17  0237  BP: 125/75 136/86  Pulse: 77 86  Resp: 19 18  Temp: 97.7 F (36.5 C) 97.7 F (36.5 C)  SpO2: 100% 100%    Relatively controlled on 3/20 17. 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 Constipation with gas   Bowel regimen increased on 3/7   Simethicone started on 3/14 19.  Hypokalemia and hyponatremia- mild, Nephro to adjust dialysate  LOS (Days) 20 A FACE TO FACE EVALUATION WAS PERFORMED  Ankit Lorie Phenix 06/02/2017 10:54 AM

## 2017-06-02 NOTE — Progress Notes (Signed)
Physical Therapy Session Note  Patient Details  Name: RAMEL TOBON MRN: [id number] Date of Birth: 02-14-1971  Today's Date: 06/02/2017 PT Individual Time: 0830-0914 PT Individual Time Calculation (min): 44 min   Short Term Goals: Week 3:  PT Short Term Goal 1 (Week 3): STG=LTG  Skilled Therapeutic Interventions/Progress Updates:    Pt sitting EOB upon arrival and agreeable to PT session. Pt requesting to change clothing during session. Pt able to doff shirt independently and pants with min assist. Total assist with shoes. Pt able to perform lateral shifts to doff pants. Sit>stand performed using platform walker, attempting X2. Pt unable to achieve standing on first attempt with supervision. Able to stand on second attempt with min assist. Amb. 20 ft in room with supervision. Propelling w/c 150 ft with supervision using UE and LEs to assist. Pt up in w/c with all needs in reach after session. Pt denies and questions or concerns following session.   Therapy Documentation Precautions:  Precautions Precautions: Fall Precaution Comments: Severe RA in shoulders, left hand Required Braces or Orthoses: Other Brace/Splint Other Brace/Splint: R ankle brace Restrictions Weight Bearing Restrictions: No    Pain: 6/10 - declines getting any pain meds, monitored during session.   See Function Navigator for Current Functional Status.   Therapy/Group: Individual Therapy  Linard Millers, PT 06/02/2017, 9:21 AM

## 2017-06-02 NOTE — Progress Notes (Signed)
Occupational Therapy Session Note  Patient Details  Name: John Bailey MRN: [id number] Date of Birth: 08/16/1970  Today's Date: 06/02/2017 OT Individual Time: 1300-1358 OT Individual Time Calculation (min): 58 min    Short Term Goals: Week 2:  OT Short Term Goal 1 (Week 2): Pt will complete sit to stand with max assist in preparation for LB selfcare tasks.  OT Short Term Goal 1 - Progress (Week 2): Met OT Short Term Goal 2 (Week 2): Pt will complete sliding board transfer with max assist from bed to wheelchair or drop arm commode. OT Short Term Goal 2 - Progress (Week 2): Met OT Short Term Goal 3 (Week 2): Pt will donn pullup shorts with total assist +2 (pt 40%) sit to stand.   OT Short Term Goal 3 - Progress (Week 2): Met OT Short Term Goal 4 (Week 2): Pt will complete LB bathing with total assist +2 (pt 40%) sit to stand.   OT Short Term Goal 4 - Progress (Week 2): Met  Skilled Therapeutic Interventions/Progress Updates:    Pt's spouse present for session for family education.  Pt was able to slip on his sneakers with supervision and without tying them to start session.  Therapist had to provide total assist for donning ankle splint on the right foot.  Therapist educated pt and spouse on possible use of elastic shoe strings as well as a shoe funnel for donning the shoes more easily once tied.  He then completed toilet transfer with min assist, ambulating to the bathroom with min assist using the RW.  Once finished, he ambulated all the way down to the ADL apartment where therapist educated pt and spouse on walk-in shower transfers with use of the RW, once pt is allowed to shower.  At this time he is still receiving dialysis through the central catheter and cannot shower, but has a maturing graft in his left hand.  Finished session with ambulation back to the room with min assist for initial sit to stand and supervision for mobility with use of the RW.  Pt left in wheelchair with call button  and phone in reach.  Therapy Documentation Precautions:  Precautions Precautions: Fall Precaution Comments: Severe RA in shoulders, left hand Required Braces or Orthoses: Other Brace/Splint Other Brace/Splint: R ankle brace Restrictions Weight Bearing Restrictions: No   Pain: Pain Assessment Pain Assessment: No/denies pain Pain Score: 0-No pain ADL: See Function Navigator for Current Functional Status.   Therapy/Group: Individual Therapy  Denaja Verhoeven OTR/L 06/02/2017, 3:36 PM

## 2017-06-03 ENCOUNTER — Inpatient Hospital Stay (HOSPITAL_COMMUNITY): Payer: BLUE CROSS/BLUE SHIELD | Admitting: Physical Therapy

## 2017-06-03 ENCOUNTER — Ambulatory Visit (HOSPITAL_COMMUNITY): Payer: BLUE CROSS/BLUE SHIELD

## 2017-06-03 ENCOUNTER — Inpatient Hospital Stay (HOSPITAL_COMMUNITY): Payer: BLUE CROSS/BLUE SHIELD | Admitting: Occupational Therapy

## 2017-06-03 DIAGNOSIS — Z91199 Patient's noncompliance with other medical treatment and regimen due to unspecified reason: Secondary | ICD-10-CM

## 2017-06-03 DIAGNOSIS — Z9119 Patient's noncompliance with other medical treatment and regimen: Secondary | ICD-10-CM

## 2017-06-03 LAB — RENAL FUNCTION PANEL
ALBUMIN: 2.5 g/dL — AB (ref 3.5–5.0)
ANION GAP: 13 (ref 5–15)
BUN: 110 mg/dL — ABNORMAL HIGH (ref 6–20)
CALCIUM: 8.6 mg/dL — AB (ref 8.9–10.3)
CO2: 23 mmol/L (ref 22–32)
Chloride: 98 mmol/L — ABNORMAL LOW (ref 101–111)
Creatinine, Ser: 6.37 mg/dL — ABNORMAL HIGH (ref 0.61–1.24)
GFR calc Af Amer: 11 mL/min — ABNORMAL LOW (ref >60)
GFR calc non Af Amer: 10 mL/min — ABNORMAL LOW (ref >60)
GLUCOSE: 269 mg/dL — AB (ref 65–99)
PHOSPHORUS: 4.6 mg/dL (ref 2.5–4.6)
Potassium: 3.6 mmol/L (ref 3.5–5.1)
SODIUM: 134 mmol/L — AB (ref 135–145)

## 2017-06-03 LAB — CBC WITH DIFFERENTIAL/PLATELET
BASOS ABS: 0 10*3/uL (ref 0.0–0.1)
Basophils Relative: 0 %
Eosinophils Absolute: 0 10*3/uL (ref 0.0–0.7)
Eosinophils Relative: 0 %
HEMATOCRIT: 30.4 % — AB (ref 39.0–52.0)
HEMOGLOBIN: 9.6 g/dL — AB (ref 13.0–17.0)
LYMPHS PCT: 11 %
Lymphs Abs: 2.4 10*3/uL (ref 0.7–4.0)
MCH: 27.8 pg (ref 26.0–34.0)
MCHC: 31.6 g/dL (ref 30.0–36.0)
MCV: 88.1 fL (ref 78.0–100.0)
MONOS PCT: 3 %
Monocytes Absolute: 0.6 10*3/uL (ref 0.1–1.0)
NEUTROS PCT: 86 %
Neutro Abs: 18.4 10*3/uL — ABNORMAL HIGH (ref 1.7–7.7)
Platelets: 368 10*3/uL (ref 150–400)
RBC: 3.45 MIL/uL — AB (ref 4.22–5.81)
RDW: 23.9 % — AB (ref 11.5–15.5)
WBC: 21.4 10*3/uL — AB (ref 4.0–10.5)

## 2017-06-03 LAB — GLUCOSE, CAPILLARY
Glucose-Capillary: 119 mg/dL — ABNORMAL HIGH (ref 65–99)
Glucose-Capillary: 144 mg/dL — ABNORMAL HIGH (ref 65–99)
Glucose-Capillary: 144 mg/dL — ABNORMAL HIGH (ref 65–99)

## 2017-06-03 MED ORDER — LIDOCAINE-PRILOCAINE 2.5-2.5 % EX CREA: 1.0000 "application " | TOPICAL_CREAM | CUTANEOUS | Status: DC | PRN

## 2017-06-03 MED ORDER — HEPARIN SODIUM (PORCINE) 1000 UNIT/ML DIALYSIS: 20.0000 [IU]/kg | INTRAMUSCULAR | Status: DC | PRN

## 2017-06-03 MED ORDER — SODIUM CHLORIDE 0.9 % IV SOLN: 100.0000 mL | INTRAVENOUS | Status: DC | PRN

## 2017-06-03 MED ORDER — CEFAZOLIN SODIUM-DEXTROSE 2-4 GM/100ML-% IV SOLN
2.0000 g | Freq: Once | INTRAVENOUS | Status: AC
  Administered 2017-06-04: 2 g via INTRAVENOUS
  Filled 2017-06-03: qty 100

## 2017-06-03 MED ORDER — ALTEPLASE 2 MG IJ SOLR: 2.0000 mg | Freq: Once | INTRAMUSCULAR | Status: DC | PRN

## 2017-06-03 MED ORDER — DARBEPOETIN ALFA 150 MCG/0.3ML IJ SOSY
PREFILLED_SYRINGE | INTRAMUSCULAR | Status: AC
  Administered 2017-06-03: 150 ug via INTRAVENOUS
  Filled 2017-06-03: qty 0.3

## 2017-06-03 MED ORDER — PENTAFLUOROPROP-TETRAFLUOROETH EX AERO: 1.0000 "application " | INHALATION_SPRAY | CUTANEOUS | Status: DC | PRN

## 2017-06-03 MED ORDER — ACETAMINOPHEN 325 MG PO TABS
ORAL_TABLET | ORAL | Status: AC
  Filled 2017-06-03: qty 2

## 2017-06-03 MED ORDER — LIDOCAINE HCL (PF) 1 % IJ SOLN: 5.0000 mL | INTRAMUSCULAR | Status: DC | PRN

## 2017-06-03 MED ORDER — HEPARIN SODIUM (PORCINE) 1000 UNIT/ML DIALYSIS: 1000.0000 [IU] | INTRAMUSCULAR | Status: DC | PRN

## 2017-06-03 NOTE — Progress Notes (Signed)
Dialysis treatment completed.  4500 mL ultrafiltrated.  4000 mL net fluid removal.  Patient status unchanged. Lung sounds diminished to ausculation in all fields. Generalized edema. Cardiac: NSR.  Cleansed RIJ catheter with chlorhexidine.  Disconnected lines and flushed ports with saline per protocol.  Ports locked with heparin and capped per protocol.    Report given to bedside, RN Maudry Mayhew.

## 2017-06-03 NOTE — Progress Notes (Signed)
Occupational Therapy Session Note  Patient Details  Name: John Bailey MRN: [id number] Date of Birth: [birth year]  Today's Date: 06/03/2017 OT Individual Time: 4098-1191 OT Individual Time Calculation (min): 47 min    Short Term Goals: Week 2:  OT Short Term Goal 1 (Week 2): Pt will complete sit to stand with max assist in preparation for LB selfcare tasks.  OT Short Term Goal 1 - Progress (Week 2): Met OT Short Term Goal 2 (Week 2): Pt will complete sliding board transfer with max assist from bed to wheelchair or drop arm commode. OT Short Term Goal 2 - Progress (Week 2): Met OT Short Term Goal 3 (Week 2): Pt will donn pullup shorts with total assist +2 (pt 40%) sit to stand.   OT Short Term Goal 3 - Progress (Week 2): Met OT Short Term Goal 4 (Week 2): Pt will complete LB bathing with total assist +2 (pt 40%) sit to stand.   OT Short Term Goal 4 - Progress (Week 2): Met  Skilled Therapeutic Interventions/Progress Updates:    Pt completed grooming tasks and donning shoes during session from wheelchair level.  He completed all grooming tasks with setup assistance only.  Therapist applied elastic shoe laces in his shoes and tied them, then pt was able to donn the shoes with use of a shoe funnel.  After this, while sitting in the wheelchair, pt reported increased dizziness.  BP taken x 2 at 153/123 and 134/108 with automated cuff.  He was then transferred back to the bed with min assist and use of the RW with platform.  Min assist for bringing LEs up into the bed as well and pt positioned on his side.  BP taken with trendellenberg of -6 degrees at 86/38 and 90/42.  Nursing made aware and pain meds brought per pt request secondary to sacral wound.  BP taken one more time with HOB elevated to 12 degrees at 82/40.  Pt left in bed with call button and phone in reach.    Therapy Documentation Precautions:  Precautions Precautions: Fall Precaution Comments: Severe RA in shoulders, left  hand Required Braces or Orthoses: Other Brace/Splint Other Brace/Splint: R ankle brace Restrictions Weight Bearing Restrictions: No  Vital Signs: Therapy Vitals Pulse Rate: 88 BP: (!) 95/42 Patient Position (if appropriate): Lying Oxygen Therapy O2 Device: Room Air Pulse Oximetry Type: Intermittent Pain: Pain Assessment Pain Scale: Faces Pain Score: 2  Faces Pain Scale: Hurts a little bit Pain Type: Chronic pain Pain Location: Hand Pain Orientation: Left Pain Descriptors / Indicators: Discomfort Pain Onset: With Activity Pain Intervention(s): Repositioned ADL: See Function Navigator for Current Functional Status.   Therapy/Group: Individual Therapy  Deshea Pooley OTR/L 06/03/2017, 12:24 PM

## 2017-06-03 NOTE — Progress Notes (Signed)
Social Work Patient ID: John Bailey, male   DOB: 1970-09-14, 47 y.o.   MRN: [id number] [name] has approved pt until 3/27 if needs to be here until then. Pt had questions regarding HHRN and follow up therapies, informed he will have services through [facility]. See how I & D goes tomorrow.

## 2017-06-03 NOTE — Progress Notes (Signed)
Patient arrived to unit by bed.  Reviewed treatment plan and this RN agrees with plan.  Report received from bedside RN, Maudry Mayhew.  Consent verified.  Patient A & O X 4.   Lung sounds diminished to ausculation in all fields. BLE 1+ pitting edema. Cardiac:  NSR.  Removed caps and cleansed RIJ catheter with chlorhedxidine.  Aspirated ports of heparin and flushed them with saline per protocol.  Connected and secured lines, initiated treatment at 1520.  UF Goal of 4500 mL and net fluid removal 4 L.  Will continue to monitor.

## 2017-06-03 NOTE — Progress Notes (Signed)
Physical Therapy Session Note  Patient Details  Name: John Bailey MRN: [id number] Date of Birth: 03/01/1971  Today's Date: 06/03/2017 PT Individual Time: 0800-0855 AND 1000-1025 PT Individual Time Calculation (min): 55 min AND 25 min  Short Term Goals: Week 3:  PT Short Term Goal 1 (Week 3): STG=LTG  Skilled Therapeutic Interventions/Progress Updates:   Session 1:  Pt sitting EOB and agreeable to therapy, c/o R knee pain as detailed below. Pt reports this is because he "slept on it wrong". Changed to new clothes w/ set-up assist w/ shirt and mod assist pants, total assist to thread LEs. Performed dressing in standing this session, maintaining dynamic standing balance w/ min guard for safety during this time. Intermittent UE support on PFRW, otherwise no UE support during dressing tasks. Transferred to w/c via stand pivot w/ RW and self-propelled w/c 100' using BUEs and increased time 2/2 LUE stiffness. Total assist w/c transport to day room for energy conservation. Performed kinetron 3 min x3 at 50 cm/sec for gentle ROM and pain relief on R knee. Pt reports decreased pain and stiffness afterwards. Ambulated 150' x2 w/ PFRW and close supervision to work on endurance. Increased inflation in roho cushion for adequate pressure relief during rest break. Returned to room and ended session in w/c, call bell within reach and all needs met.   Session 2:  Per conversation w/ OT, pt's BP low during OT session. Pt in supine and BP 82/36, HR 81 - RN made aware and pt non-symptomatic. Performed bed level therapies this session. Provided PROM of R knee into flexion and extension w/ brief static stretch at end range for pain relief. Held static hs stretch 20 sec x3 reps. Assisted pt w/ positioning for pressure relief of sacral wound into L sidelying. Educated pt on LE placement w/ pillows for both pressure and pain relief of R knee. Pt reports increased pain relief in both sacrum and R knee in this position.  Provided moist heat pack to R knee and ended session in sidelying, call bell within reach and all needs met. Missed 20 min of skilled PT 2/2 low BP.   Therapy Documentation Precautions:  Precautions Precautions: Fall Precaution Comments: Severe RA in shoulders, left hand Required Braces or Orthoses: Other Brace/Splint Other Brace/Splint: R ankle brace Restrictions Weight Bearing Restrictions: No Pain: Pain Assessment Pain Scale: 0-10 Pain Score: 5  Pain Type: Chronic pain Pain Location: Knee Pain Orientation: Right Pain Descriptors / Indicators: Aching Pain Onset: On-going  See Function Navigator for Current Functional Status.   Therapy/Group: Individual Therapy  Amy K Arnette 06/03/2017, 9:28 AM  

## 2017-06-03 NOTE — Progress Notes (Signed)
PHYSICAL MEDICINE & REHABILITATION     PROGRESS NOTE  Subjective/Complaints:  Patient seen lying in bed this morning. He states he slept well overnight. He states he was doing well until he was told he will not be able to go home today. He has questions about the follow-up for his wound.  ROS: Denies CP, SOB, N/V/D  Objective: Vital Signs: Blood pressure (!) 151/87, pulse 87, temperature (!) 97.5 F (36.4 C), temperature source Oral, resp. rate 15, height 6' (1.829 m), weight 126.9 kg (279 lb 12.2 oz), SpO2 100 %. No results found. Recent Labs    06/01/17 1708  WBC 23.6*  HGB 9.4*  HCT 29.2*  PLT 493*   Recent Labs    06/01/17 1708  NA 134*  K 3.6  CL 96*  GLUCOSE 111*  BUN 128*  CREATININE 7.22*  CALCIUM 9.2   CBG (last 3)  Recent Labs    06/02/17 1650 06/02/17 2128 06/03/17 0623  GLUCAP 316* 303* 119*    Wt Readings from Last 3 Encounters:  06/03/17 126.9 kg (279 lb 12.2 oz)  05/13/17 132.1 kg (291 lb 3.6 oz)  04/02/17 (!) 154.9 kg (341 lb 9.6 oz)    Physical Exam:  BP (!) 151/87 (BP Location: Right Arm)   Pulse 87   Temp (!) 97.5 F (36.4 C) (Oral)   Resp 15   Ht 6' (1.829 m)   Wt 126.9 kg (279 lb 12.2 oz)   SpO2 100%   BMI 37.94 kg/m  Constitutional: No distress . Vital signs reviewed. HENT: Normocephalic, atraumatic Eyes: EOMI, oral membranes moist Cardiovascular: RRR. No JVD    Respiratory: CTA Bilaterally. Normal effort    GI: BS +, non-distended  Musc:  Bouchard's deformities  +Edema b/l LE Neurological: Alert and oriented.  Motor: Right upper extremity 4/5 proximal to distal  Left upper extremity: Shoulder abduction, elbow flexion/extension 4-/5, hand grip 2-/5  Bilateral lower extremities: 4-/5 proximal to distal (slowly improving)  Skin.  Lower extremity ischemic changes.   Assessment/Plan: 1. Functional deficits secondary to debility which require 3+ hours per day of interdisciplinary therapy in a comprehensive inpatient  rehab setting. Physiatrist is providing close team supervision and 24 hour management of active medical problems listed below. Physiatrist and rehab team continue to assess barriers to discharge/monitor patient progress toward functional and medical goals.  Function:  Bathing Bathing position   Position: Standing at sink  Bathing parts Body parts bathed by patient: Right arm, Left arm, Chest, Abdomen, Front perineal area, Right upper leg, Left upper leg, Right lower leg, Left lower leg, Buttocks Body parts bathed by helper: Back  Bathing assist Assist Level: Touching or steadying assistance(Pt > 75%)      Upper Body Dressing/Undressing Upper body dressing   What is the patient wearing?: Pull over shirt/dress     Pull over shirt/dress - Perfomed by patient: Thread/unthread right sleeve, Thread/unthread left sleeve, Put head through opening Pull over shirt/dress - Perfomed by helper: Pull shirt over trunk        Upper body assist Assist Level: Touching or steadying assistance(Pt > 75%)      Lower Body Dressing/Undressing Lower body dressing Lower body dressing/undressing activity did not occur: N/A What is the patient wearing?: Underwear, Pants, Shoes, AFO Underwear - Performed by patient: Thread/unthread right underwear leg, Thread/unthread left underwear leg, Pull underwear up/down Underwear - Performed by helper: Pull underwear up/down Pants- Performed by patient: Thread/unthread left pants leg, Thread/unthread right pants leg, Pull pants  up/down Pants- Performed by helper: Pull pants up/down         Shoes - Performed by patient: Don/doff right shoe, Don/doff left shoe Shoes - Performed by helper: Don/doff right shoe, Don/doff left shoe AFO - Performed by patient: Don/doff right AFO AFO - Performed by helper: Don/doff right AFO      Lower body assist Assist for lower body dressing: Touching or steadying assistance (Pt > 75%)      Toileting Toileting Toileting activity  did not occur: No continent bowel/bladder event Toileting steps completed by patient: Adjust clothing prior to toileting, Adjust clothing after toileting, Performs perineal hygiene Toileting steps completed by helper: Adjust clothing after toileting, Adjust clothing prior to toileting    Toileting assist Assist level: Touching or steadying assistance (Pt.75%)   Transfers Chair/bed transfer Chair/bed transfer activity did not occur: Refused Chair/bed transfer method: Ambulatory, Stand pivot Chair/bed transfer assist level: Supervision or verbal cues Chair/bed transfer assistive device: Armrests, Walker Mechanical lift: Ecologist Ambulation activity did not occur: Refused   Max distance: 150' Assist level: Supervision or verbal cues   Wheelchair Wheelchair activity did not occur: Refused Type: Manual Max wheelchair distance: 100' Assist Level: Supervision or verbal cues  Cognition Comprehension Comprehension assist level: Follows complex conversation/direction with no assist  Expression Expression assist level: Expresses complex ideas: With extra time/assistive device  Social Interaction Social Interaction assist level: Interacts appropriately with others - No medications needed.  Problem Solving Problem solving assist level: Solves basic 90% of the time/requires cueing < 10% of the time  Memory Memory assist level: Recognizes or recalls 90% of the time/requires cueing < 10% of the time    Medical Problem List and Plan: 1.Debilitysecondary to cardiorenal syndrome, synovitis left hand due to RA   Cont CIR   2. DVT Prophylaxis/Anticoagulation: Eliquis. Monitor for any bleeding episodes 3. Pain Management:Neurontin 300 mg daily, Robaxin as needed   Voltaren gel added for joint pain.   Neuropathic pain Left hand - cont gabapentin, started on capsasin cream .025%   Lidoderm patch added on 3/5   Controlled on 3/20- better since prednisone started 4.  Mood:Provide emotional support 5. Neuropsych: This patientiscapable of making decisions on hisown behalf. 6. Skin/Wound Care:Routine skin checks 7. Fluids/Electrolytes/Nutrition:Routine I&O's 8.End-stage renal disease. Hemodialysis initiated Tuesday Thursday Saturday schedule. Patient with left AVFgraft placed 05/10/2017   Appreciate nephro recs. 9.Acute on chronic anemia. Continue Aranesp.    Hb 9.4 on 3/19   Cont to monitor 10.Steroid induced hyperglycemia on Diabetes mellitus with peripheral neuropathy. Latest hemoglobin A1c 9.2. Diabetic teaching   Lantus insulin 25 units daily at bedtime, increased to 30 on 3/8, increased to 35 on 3/14, decreased to 30 given steroid taper and labile readings to avoid hypoglycemia.  Steroids being tapered   NovoLog 3 units 3 times a day, increased to 6 on 3/4, increased to 10 on 3/14.    CBG (last 3)  Recent Labs    06/02/17 1650 06/02/17 2128 06/03/17 0623  GLUCAP 316* 303* 119*    Extremely labile due to steroids and noncompliance with diet 11.Acute on chronic diastolic congestive heart failure. Monitor for any signs of fluid overload. Continue Coreg 6.25mg  BID  Filed Weights   06/01/17 1925 06/02/17 0237 06/03/17 0700  Weight: 123.7 kg (272 lb 11.3 oz) 124 kg (273 lb 5.9 oz) 126.9 kg (279 lb 12.2 oz)     Volume management per nephrology.    12.Rheumatoid arthritis. Prednisone 10 mg daily. Patient had  been receiving Abataceptevery Tuesday prior to admission. Added Voltaren gel. Follow-up rheumatologist Dr.Ziokowska   MRI right knee reviewed, showing OA, meniscal tear and patellar tendinosis on right.  Per Ortho, braces PRN   DMARD resumed on 3/5    Started on high-dose 6 week steroid taper on 3/6  13.Atrial fibrillation RVR. Status post cardioversion. Amiodarone decreased to 200 mg daily 3/3 14.Leukocytosis.     Afebrile   WBCs 23.6 on 3/19, likely steroid related   Hand films reviewed, suggesting RA changes   Continue to  monitor   Cont to monitor 15.  Left hand numbness   Likely related to fistula placement +/- RA 16. Labile blood pressure   Vitals:   06/02/17 1400 06/03/17 0150  BP: 116/80 (!) 151/87  Pulse: 93 87  Resp: 18 15  Temp: 97.7 F (36.5 C) (!) 97.5 F (36.4 C)  SpO2: 97% 100%    Partly related to hemodialysis 17. 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 Constipation with gas   Bowel regimen increased on 3/7   Simethicone started on 3/14 19.  Hypokalemia and hyponatremia- mild, Nephro to adjust dialysate  LOS (Days) 21 A FACE TO FACE EVALUATION WAS PERFORMED  Katyana Trolinger Lorie Phenix 06/03/2017 9:28 AM

## 2017-06-03 NOTE — Progress Notes (Addendum)
Social Work Patient ID: John Bailey, male   DOB: February 16, 1971, 47 y.o.   MRN: [id number]  Clinical update sent to [facility] for continued stay approval. Await response, made aware of surgery this am for sacral wound. Pt aware of team conference update and hopes this goes well so he can return home soon.

## 2017-06-03 NOTE — Procedures (Signed)
Tol HD. Goal 4000cc. BP stable. Trying to improve volume.  Has edema. Estanislado Emms, MD

## 2017-06-03 NOTE — Progress Notes (Signed)
Physical Therapy Wound Treatment Patient Details  Name: John Bailey MRN: [id number] Date of Birth: Dec 13, 1970  Today's Date: 06/03/2017 PT Individual Time: 1112-1212 PT Individual Time Calculation (min): 60 min   Subjective  Subjective: Pt thankful that his wound will be taken care of surgically tomorrow Patient and Family Stated Goals: heal wound decrease odor  Pain Score: Pt premedicated prior to treatment. Pt having 2/10 in R knee on entry.  Wound Assessment  Pressure Injury 05/28/17 Unstageable - Full thickness tissue loss in which the base of the ulcer is covered by slough (yellow, tan, gray, green or brown) and/or eschar (tan, brown or black) in the wound bed. Central gluteal fold (Active)  Wound Image   06/02/2017 11:00 AM  Dressing Type ABD;Barrier Film (skin prep);Gauze (Comment);Moist to dry;Other (Comment) 06/03/2017  1:00 PM  Dressing Changed 06/03/2017  1:00 PM  Dressing Change Frequency Daily 06/03/2017  1:00 PM  State of Healing Non-healing 06/03/2017  1:00 PM  Site / Wound Assessment Yellow;Brown 06/03/2017  1:00 PM  % Wound base Red or Granulating 90% 06/03/2017  1:00 PM  % Wound base Yellow/Fibrinous Exudate 10% 06/03/2017  1:00 PM  Peri-wound Assessment Erythema (blanchable);Maceration;Pink;Denuded 06/03/2017  1:00 PM  Wound Length (cm) 8.5 cm 06/02/2017 11:00 AM  Wound Width (cm) 5 cm 06/02/2017 11:00 AM  Wound Depth (cm) 3 cm 06/02/2017 11:00 AM  Wound Surface Area (cm^2) 42.5 cm^2 06/02/2017 11:00 AM  Wound Volume (cm^3) 127.5 cm^3 06/02/2017 11:00 AM  Tunneling (cm) 6 cm at 12 o'clock 06/02/2017 11:00 AM  Margins Unattached edges (unapproximated) 06/03/2017  1:00 PM  Drainage Amount Minimal 06/03/2017  1:00 PM  Drainage Description Purulent;Odor 06/03/2017  1:00 PM  Treatment Cleansed;Debridement (Selective);Hydrotherapy (Pulse lavage);Packing (Saline gauze) 06/03/2017  1:00 PM     Wound / Incision (Open or Dehisced) 05/28/17 Non-pressure wound Sacrum Right;Left;Medial R  buttock (Active)  Wound Image   06/02/2017 11:00 AM  Dressing Type ABD;Barrier Film (skin prep);Gauze (Comment);Moist to dry;Other (Comment) 06/03/2017  1:00 PM  Dressing Changed Changed 06/03/2017  1:00 PM  Dressing Status Clean;Dry;Intact 06/03/2017  1:00 PM  Dressing Change Frequency Daily 06/03/2017  1:00 PM  Site / Wound Assessment Yellow;Painful 06/03/2017  1:00 PM  % Wound base Yellow/Fibrinous Exudate 100% 06/03/2017  1:00 PM  Peri-wound Assessment Denuded;Pink 06/03/2017  1:00 PM  Wound Length (cm) 3.5 cm 06/02/2017 11:00 AM  Wound Width (cm) 2.5 cm 06/02/2017 11:00 AM  Wound Surface Area (cm^2) 8.75 cm^2 06/02/2017 11:00 AM  Margins Unattached edges (unapproximated) 06/03/2017  1:00 PM  Closure None 06/03/2017  1:00 PM  Drainage Amount Moderate 06/03/2017  1:00 PM  Drainage Description Purulent;Odor 06/03/2017  1:00 PM  Treatment Cleansed;Debridement (Selective);Hydrotherapy (Pulse lavage);Packing (Saline gauze) 06/03/2017  1:00 PM   Santyl applied to wound bed   Hydrotherapy Pulsed lavage therapy - wound location: sacrum Pulsed Lavage with Suction (psi): 4 psi(4-12) Pulsed Lavage with Suction - Normal Saline Used: 1000 mL Pulsed Lavage Tip: Tip with splash shield Selective Debridement Selective Debridement - Location: sacrum  Selective Debridement - Tools Used: Forceps;Scissors Selective Debridement - Tissue Removed: brown, gray yellow, necrotic tissue.    Wound Assessment and Plan  Wound Therapy - Assess/Plan/Recommendations Wound Therapy - Clinical Statement: Pt with continued debridement of abcessed area, still unable to visulaize viable tissue. Pt to have area surgically debrided tomorrow.  Factors Delaying/Impairing Wound Healing: Immobility;Multiple medical problems;Diabetes Mellitus Hydrotherapy Plan: Debridement;Patient/family education;Pulsatile lavage with suction Wound Therapy - Frequency: 6X / week Wound Therapy - Current Recommendations:  Surgery consult;WOC nurse Wound  Therapy - Follow Up Recommendations: Other (comment) Wound Plan: see above  Wound Therapy Goals- Improve the function of patient's integumentary system by progressing the wound(s) through the phases of wound healing (inflammation - proliferation - remodeling) by: Decrease Necrotic Tissue to: 50 Decrease Necrotic Tissue - Progress: Not met Increase Granulation Tissue to: 50 Increase Granulation Tissue - Progress: Not met Goals/treatment plan/discharge plan were made with and agreed upon by patient/family: Yes Time For Goal Achievement: 7 days Wound Therapy - Potential for Goals: Fair  Goals will be updated until maximal potential achieved or discharge criteria met.  Discharge criteria: when goals achieved, discharge from hospital, MD decision/surgical intervention, no progress towards goals, refusal/missing three consecutive treatments without notification or medical reason.  GP    Dani Gobble. [name] PT, DPT Acute Rehabilitation  [phone] Pager [phone]   [employer] 06/03/2017, 1:49 PM

## 2017-06-03 NOTE — Plan of Care (Signed)
  Problem: Consults Goal: RH GENERAL PATIENT EDUCATION Description See Patient Education module for education specifics. Outcome: Progressing Goal: Skin Care Protocol Initiated - if Braden Score 18 or less Description If consults are not indicated, leave blank or document N/A Outcome: Progressing Goal: Diabetes Guidelines if Diabetic/Glucose > 140 Description If diabetic or lab glucose is > 140 mg/dl - Initiate Diabetes/Hyperglycemia Guidelines & Document Interventions  Outcome: Progressing   Problem: RH SKIN INTEGRITY Goal: RH STG ABLE TO PERFORM INCISION/WOUND CARE W/ASSISTANCE Description STG Able To Perform Incision/Wound Care With mod Assistance.  Outcome: Progressing   Problem: RH PAIN MANAGEMENT Goal: RH STG PAIN MANAGED AT OR BELOW PT'S PAIN GOAL Description < 5  Outcome: Progressing   Problem: RH SKIN INTEGRITY Goal: RH STG SKIN FREE OF INFECTION/BREAKDOWN Description Patients skin will remain free from further breakdown or infection with mod assist.  Outcome: Not Progressing Goal: RH STG MAINTAIN SKIN INTEGRITY WITH ASSISTANCE Description STG Maintain Skin Integrity With mod Assistance.  Outcome: Not Progressing

## 2017-06-03 NOTE — Progress Notes (Signed)
[facility] Progress Note     Subjective: CC: sacral wound Patient reports tenderness over wound, has not changed in last 24 h. Notes foul odor. Concerned about home wound care, does not think that his wife will be able to help with home dressing changes.  VSS.   Objective: Vital signs in last 24 hours: Temp:  [97.5 F (36.4 C)-97.7 F (36.5 C)] 97.5 F (36.4 C) (03/21 0150) Pulse Rate:  [87-93] 87 (03/21 0150) Resp:  [15-18] 15 (03/21 0150) BP: (116-151)/(80-87) 151/87 (03/21 0150) SpO2:  [97 %-100 %] 100 % (03/21 0150) Weight:  [126.9 kg (279 lb 12.2 oz)] 126.9 kg (279 lb 12.2 oz) (03/21 0700) Last BM Date: 06/01/17  Intake/Output from previous day: 03/20 0701 - 03/21 0700 In: 360 [P.O.:360] Out: 200 [Urine:200] Intake/Output this shift: No intake/output data recorded.  PE: Gen:  Alert, NAD, pleasant Card:  Regular rate and rhythm, pedal pulses 2+ BL, mild edema in BL LE Pulm:  Normal effort Abd: Soft, non-tender, non-distended Sacrum: wound with foul odor and necrotic tissue Skin: warm and dry, no rashes  Psych: A&Ox3   Lab Results:  Recent Labs    06/01/17 1708  WBC 23.6*  HGB 9.4*  HCT 29.2*  PLT 493*   BMET Recent Labs    06/01/17 1708  NA 134*  K 3.6  CL 96*  CO2 23  GLUCOSE 111*  BUN 128*  CREATININE 7.22*  CALCIUM 9.2   PT/INR No results for input(s): LABPROT, INR in the last 72 hours. CMP     Component Value Date/Time   NA 134 (L) 06/01/2017 1708   K 3.6 06/01/2017 1708   CL 96 (L) 06/01/2017 1708   CO2 23 06/01/2017 1708   GLUCOSE 111 (H) 06/01/2017 1708   BUN 128 (H) 06/01/2017 1708   CREATININE 7.22 (H) 06/01/2017 1708   CALCIUM 9.2 06/01/2017 1708   PROT 5.9 (L) 05/05/2017 0430   ALBUMIN 2.8 (L) 06/01/2017 1708   AST 12 (L) 05/05/2017 0430   ALT 12 (L) 05/22/2017 1822   ALKPHOS 74 05/05/2017 0430   BILITOT 0.6 05/05/2017 0430   GFRNONAA 8 (L) 06/01/2017 1708   GFRAA 10 (L) 06/01/2017 1708   Lipase  No  results found for: LIPASE     Studies/Results: No results found.  Anti-infectives: Anti-infectives (From admission, onward)   Start     Dose/Rate Route Frequency Ordered Stop   05/19/17 1000  acyclovir (ZOVIRAX) 200 MG capsule 200 mg     200 mg Oral 2 times daily 05/19/17 2683 05/25/17 2236       Assessment/Plan Active Problems:   Debility   Cardiorenal syndrome with renal failure   Pain   Leukocytosis   PAF (paroxysmal atrial fibrillation) (HCC)   Acute on chronic diastolic (congestive) heart failure (HCC)   Type 2 diabetes mellitus with peripheral neuropathy (HCC)   Acute blood loss anemia   Anemia of chronic disease   ESRD on dialysis ([place])   Carpal tunnel syndrome of left wrist   Labile blood pressure   Labile blood glucose   HSV (herpes simplex virus) infection   Neuropathic pain   Constipation   Steroid-induced hyperglycemia  Sacral wound - would benefit from debridement - holding Eliquis for OR tomorrow, will resume post-op  - patient will need BID dressing changes post-op, concerned that wife will not be able to assist with this  FEN: NPO after MN  VTE: SCDs ID: no current abx   LOS: 21 days  Brigid Re , [facility] 06/03/2017, 7:33 AM Pager: [phone] Consults: [phone] Mon-Fri 7:00 am-4:30 pm Sat-Sun 7:00 am-11:30 am

## 2017-06-04 ENCOUNTER — Inpatient Hospital Stay (HOSPITAL_COMMUNITY): Payer: BLUE CROSS/BLUE SHIELD | Admitting: Certified Registered"

## 2017-06-04 ENCOUNTER — Inpatient Hospital Stay (HOSPITAL_COMMUNITY)
Admission: RE | Admit: 2017-06-04 | Payer: BLUE CROSS/BLUE SHIELD | Source: Ambulatory Visit | Admitting: General Surgery

## 2017-06-04 ENCOUNTER — Encounter (HOSPITAL_COMMUNITY): Payer: Self-pay | Admitting: Certified Registered"

## 2017-06-04 ENCOUNTER — Encounter (HOSPITAL_COMMUNITY)
Admission: RE | Disposition: A | Discharge status: Other Acute Inpatient Hospital | Payer: Self-pay | Source: Intra-hospital | Attending: Physical Medicine & Rehabilitation

## 2017-06-04 DIAGNOSIS — L89150 Pressure ulcer of sacral region, unstageable: Secondary | ICD-10-CM

## 2017-06-04 HISTORY — PX: DEBRIDMENT OF DECUBITUS ULCER: SHX6276

## 2017-06-04 LAB — GLUCOSE, CAPILLARY
GLUCOSE-CAPILLARY: 65 mg/dL (ref 65–99)
GLUCOSE-CAPILLARY: 120 mg/dL — AB (ref 65–99)
Glucose-Capillary: 86 mg/dL (ref 65–99)
Glucose-Capillary: 88 mg/dL (ref 65–99)
Glucose-Capillary: 70 mg/dL (ref 65–99)
Glucose-Capillary: 72 mg/dL (ref 65–99)
Glucose-Capillary: 112 mg/dL — ABNORMAL HIGH (ref 65–99)

## 2017-06-04 LAB — POCT I-STAT 4, (NA,K, GLUC, HGB,HCT)
Glucose, Bld: 67 mg/dL (ref 65–99)
HCT: 27 % — ABNORMAL LOW (ref 39.0–52.0)
HEMOGLOBIN: 9.2 g/dL — AB (ref 13.0–17.0)
Potassium: 2.9 mmol/L — ABNORMAL LOW (ref 3.5–5.1)
Sodium: 140 mmol/L (ref 135–145)

## 2017-06-04 SURGERY — DEBRIDMENT OF DECUBITUS ULCER
Anesthesia: General

## 2017-06-04 MED ORDER — GABAPENTIN 300 MG PO CAPS
300.0000 mg | ORAL_CAPSULE | Freq: Every day | ORAL | Status: DC
  Administered 2017-06-04 – 2017-06-07 (×4): 300 mg via ORAL
  Filled 2017-06-04 (×5): qty 1

## 2017-06-04 MED ORDER — HYDROCORTISONE NA SUCCINATE PF 100 MG IJ SOLR
INTRAMUSCULAR | Status: DC | PRN
  Administered 2017-06-04: 125 mg via INTRAVENOUS

## 2017-06-04 MED ORDER — CALCITRIOL 0.25 MCG PO CAPS
0.5000 ug | ORAL_CAPSULE | ORAL | Status: DC
  Administered 2017-06-05: 0.5 ug via ORAL
  Filled 2017-06-04: qty 2

## 2017-06-04 MED ORDER — ALBUMIN HUMAN 5 % IV SOLN
INTRAVENOUS | Status: DC | PRN
  Administered 2017-06-04: 13:00:00 via INTRAVENOUS

## 2017-06-04 MED ORDER — ACETAMINOPHEN 325 MG PO TABS
650.0000 mg | ORAL_TABLET | Freq: Four times a day (QID) | ORAL | Status: DC | PRN
  Administered 2017-06-04 – 2017-06-07 (×4): 650 mg via ORAL
  Filled 2017-06-04 (×4): qty 2

## 2017-06-04 MED ORDER — BUPIVACAINE-EPINEPHRINE (PF) 0.25% -1:200000 IJ SOLN
INTRAMUSCULAR | Status: AC
  Filled 2017-06-04: qty 30

## 2017-06-04 MED ORDER — APIXABAN 5 MG PO TABS
5.0000 mg | ORAL_TABLET | Freq: Two times a day (BID) | ORAL | Status: DC
  Administered 2017-06-05 – 2017-06-07 (×6): 5 mg via ORAL
  Filled 2017-06-04 (×7): qty 1

## 2017-06-04 MED ORDER — AMIODARONE HCL 200 MG PO TABS
200.0000 mg | ORAL_TABLET | Freq: Every day | ORAL | Status: DC
  Administered 2017-06-04 – 2017-06-07 (×4): 200 mg via ORAL
  Filled 2017-06-04 (×5): qty 1

## 2017-06-04 MED ORDER — SORBITOL 70 % SOLN
30.0000 mL | Freq: Every day | Status: DC | PRN
Start: 2017-06-04 — End: 2017-06-08

## 2017-06-04 MED ORDER — CARVEDILOL 6.25 MG PO TABS
6.2500 mg | ORAL_TABLET | Freq: Two times a day (BID) | ORAL | Status: DC
  Administered 2017-06-04 – 2017-06-07 (×6): 6.25 mg via ORAL
  Filled 2017-06-04 (×8): qty 1

## 2017-06-04 MED ORDER — PHENYLEPHRINE HCL 10 MG/ML IJ SOLN
INTRAVENOUS | Status: DC | PRN
  Administered 2017-06-04: 50 ug/min via INTRAVENOUS
  Administered 2017-06-04: 100 ug/min via INTRAVENOUS

## 2017-06-04 MED ORDER — SODIUM CHLORIDE 0.9 % IV SOLN
INTRAVENOUS | Status: DC
  Administered 2017-06-04 (×2): via INTRAVENOUS

## 2017-06-04 MED ORDER — HEPARIN SODIUM (PORCINE) 1000 UNIT/ML DIALYSIS: 1000.0000 [IU] | INTRAMUSCULAR | Status: DC | PRN

## 2017-06-04 MED ORDER — BUPIVACAINE LIPOSOME 1.3 % IJ SUSP: INTRAMUSCULAR | Status: DC | PRN

## 2017-06-04 MED ORDER — ALTEPLASE 2 MG IJ SOLR: 2.0000 mg | Freq: Once | INTRAMUSCULAR | Status: DC | PRN

## 2017-06-04 MED ORDER — METHOCARBAMOL 500 MG PO TABS: 500.0000 mg | ORAL_TABLET | Freq: Four times a day (QID) | ORAL | Status: DC | PRN

## 2017-06-04 MED ORDER — DEXTROSE 50 % IV SOLN
INTRAVENOUS | Status: DC | PRN
  Administered 2017-06-04 (×2): 12.5 g via INTRAVENOUS

## 2017-06-04 MED ORDER — LIDOCAINE-PRILOCAINE 2.5-2.5 % EX CREA: 1.0000 "application " | TOPICAL_CREAM | CUTANEOUS | Status: DC | PRN

## 2017-06-04 MED ORDER — SUCCINYLCHOLINE CHLORIDE 20 MG/ML IJ SOLN
INTRAMUSCULAR | Status: DC | PRN
  Administered 2017-06-04: 120 mg via INTRAVENOUS

## 2017-06-04 MED ORDER — LIDOCAINE HCL (PF) 1 % IJ SOLN: 5.0000 mL | INTRAMUSCULAR | Status: DC | PRN

## 2017-06-04 MED ORDER — SODIUM CHLORIDE 0.9 % IV SOLN: 100.0000 mL | INTRAVENOUS | Status: DC | PRN

## 2017-06-04 MED ORDER — PENTAFLUOROPROP-TETRAFLUOROETH EX AERO: 1.0000 "application " | INHALATION_SPRAY | CUTANEOUS | Status: DC | PRN

## 2017-06-04 MED ORDER — 0.9 % SODIUM CHLORIDE (POUR BTL) OPTIME
TOPICAL | Status: DC | PRN
  Administered 2017-06-04: 1000 mL

## 2017-06-04 MED ORDER — INSULIN ASPART 100 UNIT/ML ~~LOC~~ SOLN
3.0000 [IU] | Freq: Three times a day (TID) | SUBCUTANEOUS | Status: DC
  Administered 2017-06-05 – 2017-06-07 (×7): 3 [IU] via SUBCUTANEOUS

## 2017-06-04 MED ORDER — PROPOFOL 10 MG/ML IV BOLUS
INTRAVENOUS | Status: DC | PRN
  Administered 2017-06-04: 150 mg via INTRAVENOUS

## 2017-06-04 MED ORDER — BUPIVACAINE HCL (PF) 0.5 % IJ SOLN: INTRAMUSCULAR | Status: DC | PRN

## 2017-06-04 MED ORDER — FENTANYL CITRATE (PF) 250 MCG/5ML IJ SOLN
INTRAMUSCULAR | Status: DC | PRN
  Administered 2017-06-04: 100 ug via INTRAVENOUS

## 2017-06-04 MED ORDER — INSULIN GLARGINE 100 UNIT/ML ~~LOC~~ SOLN
30.0000 [IU] | Freq: Every day | SUBCUTANEOUS | Status: DC
  Administered 2017-06-04 – 2017-06-07 (×4): 30 [IU] via SUBCUTANEOUS
  Filled 2017-06-04 (×6): qty 0.3

## 2017-06-04 SURGICAL SUPPLY — 38 items
APL SKNCLS STERI-STRIP NONHPOA (GAUZE/BANDAGES/DRESSINGS)
BENZOIN TINCTURE PRP APPL 2/3 (GAUZE/BANDAGES/DRESSINGS) ×1 IMPLANT
BLADE CLIPPER SURG (BLADE) IMPLANT
BNDG CMPR 75X41 PLY ABS (GAUZE/BANDAGES/DRESSINGS) ×1
BNDG STRETCH 4X75 NS LF (GAUZE/BANDAGES/DRESSINGS) ×2 IMPLANT
CANISTER SUCT 3000ML PPV (MISCELLANEOUS) ×3 IMPLANT
COVER SURGICAL LIGHT HANDLE (MISCELLANEOUS) ×3 IMPLANT
DRAPE LAPAROTOMY T 102X78X121 (DRAPES) ×3 IMPLANT
DRAPE UTILITY XL STRL (DRAPES) ×6 IMPLANT
ELECT CAUTERY BLADE 6.4 (BLADE) ×3 IMPLANT
ELECT REM PT RETURN 9FT ADLT (ELECTROSURGICAL) ×3
ELECTRODE REM PT RTRN 9FT ADLT (ELECTROSURGICAL) ×1 IMPLANT
GAUZE SPONGE 4X4 12PLY STRL (GAUZE/BANDAGES/DRESSINGS) IMPLANT
GLOVE BIO SURGEON STRL SZ7 (GLOVE) ×3 IMPLANT
GLOVE BIOGEL PI IND STRL 7.5 (GLOVE) ×1 IMPLANT
GLOVE BIOGEL PI INDICATOR 7.5 (GLOVE) ×2
GOWN STRL REUS W/ TWL LRG LVL3 (GOWN DISPOSABLE) ×2 IMPLANT
GOWN STRL REUS W/TWL LRG LVL3 (GOWN DISPOSABLE) ×6
KIT BASIN OR (CUSTOM PROCEDURE TRAY) ×3 IMPLANT
KIT ROOM TURNOVER OR (KITS) ×3 IMPLANT
NEEDLE 22X1 1/2 (OR ONLY) (NEEDLE) ×1 IMPLANT
NS IRRIG 1000ML POUR BTL (IV SOLUTION) ×3 IMPLANT
PACK SURGICAL SETUP 50X90 (CUSTOM PROCEDURE TRAY) ×3 IMPLANT
PAD ABD 8X10 STRL (GAUZE/BANDAGES/DRESSINGS) ×2 IMPLANT
PAD ARMBOARD 7.5X6 YLW CONV (MISCELLANEOUS) ×6 IMPLANT
PENCIL BUTTON HOLSTER BLD 10FT (ELECTRODE) ×3 IMPLANT
SPECIMEN JAR SMALL (MISCELLANEOUS) ×1 IMPLANT
SPONGE LAP 18X18 X RAY DECT (DISPOSABLE) ×3 IMPLANT
SUT ETHILON 2 0 FS 18 (SUTURE) IMPLANT
SUT VIC AB 3-0 SH 18 (SUTURE) IMPLANT
SYR BULB 3OZ (MISCELLANEOUS) ×3 IMPLANT
SYR CONTROL 10ML LL (SYRINGE) ×1 IMPLANT
TAPE CLOTH SURG 6X10 WHT LF (GAUZE/BANDAGES/DRESSINGS) ×2 IMPLANT
TOWEL OR 17X24 6PK STRL BLUE (TOWEL DISPOSABLE) ×3 IMPLANT
TOWEL OR 17X26 10 PK STRL BLUE (TOWEL DISPOSABLE) ×1 IMPLANT
TUBE CONNECTING 12'X1/4 (SUCTIONS) ×1
TUBE CONNECTING 12X1/4 (SUCTIONS) ×2 IMPLANT
YANKAUER SUCT BULB TIP NO VENT (SUCTIONS) ×3 IMPLANT

## 2017-06-04 NOTE — Anesthesia Postprocedure Evaluation (Signed)
Anesthesia Post Note  Patient: John Bailey  Procedure(s) Performed: DEBRIDMENT OF SACRAL DECUBITUS ULCER (N/A )     Patient location during evaluation: PACU Anesthesia Type: General Level of consciousness: awake and alert, oriented and patient cooperative Pain management: pain level controlled Vital Signs Assessment: post-procedure vital signs reviewed and stable Respiratory status: spontaneous breathing, nonlabored ventilation, respiratory function stable and patient connected to nasal cannula oxygen Cardiovascular status: blood pressure returned to baseline and stable Postop Assessment: no apparent nausea or vomiting Anesthetic complications: no    Last Vitals:  Vitals:   06/04/17 1345 06/04/17 1355  BP: 117/70 112/68  Pulse: 87 88  Resp: 11 12  Temp:  36.6 C  SpO2: 100% 98%    Last Pain:  Vitals:   06/04/17 1355  TempSrc:   PainSc: Asleep                 Beyonca Wisz,E. Epsie Walthall

## 2017-06-04 NOTE — Progress Notes (Signed)
[facility]     PROGRESS NOTE  Subjective/Complaints:  Patient seen lying in bed this AM.  He states he slept fairly overnight due to awkward positioning for him.  He states he is to go to surgery at 12PM today.   ROS: Denies CP, SOB, N/V/D  Objective: Vital Signs: Blood pressure 113/71, pulse 100, temperature 98.5 F (36.9 C), temperature source Oral, resp. rate 17, height 6' (1.829 m), weight 123 kg (271 lb 2.7 oz), SpO2 98 %. No results found. Recent Labs    06/01/17 1708 06/03/17 1047  WBC 23.6* 21.4*  HGB 9.4* 9.6*  HCT 29.2* 30.4*  PLT 493* 368   Recent Labs    06/01/17 1708 06/03/17 1530  NA 134* 134*  K 3.6 3.6  CL 96* 98*  GLUCOSE 111* 269*  BUN 128* 110*  CREATININE 7.22* 6.37*  CALCIUM 9.2 8.6*   CBG (last 3)  Recent Labs    06/03/17 2125 06/04/17 0645 06/04/17 0805  GLUCAP 144* 86 88    Wt Readings from Last 3 Encounters:  06/04/17 123 kg (271 lb 2.7 oz)  05/13/17 132.1 kg (291 lb 3.6 oz)  04/02/17 (!) 154.9 kg (341 lb 9.6 oz)    Physical Exam:  BP 113/71 (BP Location: Right Arm)   Pulse 100   Temp 98.5 F (36.9 C) (Oral)   Resp 17   Ht 6' (1.829 m)   Wt 123 kg (271 lb 2.7 oz)   SpO2 98%   BMI 36.78 kg/m  Constitutional: No distress . Vital signs reviewed. HENT: Normocephalic, atraumatic Eyes: EOMI, oral membranes moist Cardiovascular: RRR. No JVD    Respiratory: CTA Bilaterally. Normal effort    GI: BS +, non-distended  Musc:  Bouchard's deformities  +Edema b/l LE Neurological: Alert and oriented.  Motor: Right upper extremity 4/5 proximal to distal  Left upper extremity: Shoulder abduction, elbow flexion/extension 4-/5, hand grip 2-/5  Bilateral lower extremities: 4-/5 proximal to distal (stable)  Skin.  Lower extremity ischemic changes.   Assessment/Plan: 1. Functional deficits secondary to debility which require 3+ hours per day of interdisciplinary therapy in a comprehensive inpatient rehab  setting. Physiatrist is providing close team supervision and 24 hour management of active medical problems listed below. Physiatrist and rehab team continue to assess barriers to discharge/monitor patient progress toward functional and medical goals.  Function:  Bathing Bathing position   Position: Standing at sink  Bathing parts Body parts bathed by patient: Right arm, Left arm, Chest, Abdomen, Front perineal area, Right upper leg, Left upper leg, Right lower leg, Left lower leg, Buttocks Body parts bathed by helper: Back  Bathing assist Assist Level: Touching or steadying assistance(Pt > 75%)      Upper Body Dressing/Undressing Upper body dressing   What is the patient wearing?: Pull over shirt/dress     Pull over shirt/dress - Perfomed by patient: Thread/unthread right sleeve, Thread/unthread left sleeve, Put head through opening Pull over shirt/dress - Perfomed by helper: Pull shirt over trunk        Upper body assist Assist Level: Touching or steadying assistance(Pt > 75%)      Lower Body Dressing/Undressing Lower body dressing Lower body dressing/undressing activity did not occur: N/A What is the patient wearing?: Shoes Underwear - Performed by patient: Thread/unthread right underwear leg, Thread/unthread left underwear leg, Pull underwear up/down Underwear - Performed by helper: Pull underwear up/down Pants- Performed by patient: Thread/unthread left pants leg, Thread/unthread right pants leg, Pull pants up/down  Pants- Performed by helper: Pull pants up/down         Shoes - Performed by patient: Don/doff right shoe, Don/doff left shoe Shoes - Performed by helper: Don/doff right shoe, Don/doff left shoe AFO - Performed by patient: Don/doff right AFO AFO - Performed by helper: Don/doff right AFO      Lower body assist Assist for lower body dressing: Touching or steadying assistance (Pt > 75%)      Toileting Toileting Toileting activity did not occur: No continent  bowel/bladder event Toileting steps completed by patient: Adjust clothing prior to toileting, Adjust clothing after toileting, Performs perineal hygiene Toileting steps completed by helper: Adjust clothing after toileting, Adjust clothing prior to toileting    Toileting assist Assist level: Touching or steadying assistance (Pt.75%)   Transfers Chair/bed transfer Chair/bed transfer activity did not occur: Refused Chair/bed transfer method: Stand pivot Chair/bed transfer assist level: Touching or steadying assistance (Pt > 75%) Chair/bed transfer assistive device: Armrests, Walker Mechanical lift: Ecologist Ambulation activity did not occur: Refused   Max distance: 150' Assist level: Supervision or verbal cues   Wheelchair Wheelchair activity did not occur: Refused Type: Manual Max wheelchair distance: 100' Assist Level: Supervision or verbal cues  Cognition Comprehension Comprehension assist level: Follows complex conversation/direction with no assist  Expression Expression assist level: Expresses complex ideas: With no assist  Social Interaction Social Interaction assist level: Interacts appropriately 90% of the time - Needs monitoring or encouragement for participation or interaction.  Problem Solving Problem solving assist level: Solves basic 90% of the time/requires cueing < 10% of the time  Memory Memory assist level: Recognizes or recalls 90% of the time/requires cueing < 10% of the time    Medical Problem List and Plan: 1.Debilitysecondary to cardiorenal syndrome, synovitis left hand due to RA   Cont CIR     Plans to OR today for debridement of sacral wound 2. DVT Prophylaxis/Anticoagulation: Eliquis. Monitor for any bleeding episodes 3. Pain Management:Neurontin 300 mg daily, Robaxin as needed   Voltaren gel added for joint pain.   Neuropathic pain Left hand - cont gabapentin, started on capsasin cream .025%   Lidoderm patch added on 3/5    Controlled on 3/22- better since prednisone started 4. Mood:Provide emotional support 5. Neuropsych: This patientiscapable of making decisions on hisown behalf. 6. Skin/Wound Care:Routine skin checks 7. Fluids/Electrolytes/Nutrition:Routine I&O's 8.End-stage renal disease. Hemodialysis initiated Tuesday Thursday Saturday schedule. Patient with left AVFgraft placed 05/10/2017   Appreciate nephro recs. 9.Acute on chronic anemia. Continue Aranesp.    Hb 9.6 on 3/21   Cont to monitor 10.Steroid induced hyperglycemia on Diabetes mellitus with peripheral neuropathy. Latest hemoglobin A1c 9.2. Diabetic teaching   Lantus insulin 25 units daily at bedtime, increased to 30 on 3/8, increased to 35 on 3/14, decreased to 30 given steroid taper and labile readings to avoid hypoglycemia.  Steroids being tapered   NovoLog 3 units 3 times a day, increased to 6 on 3/4, increased to 10 on 3/14.    CBG (last 3)  Recent Labs    06/03/17 2125 06/04/17 0645 06/04/17 0805  GLUCAP 144* 86 88    Running low, due to NPO 11.Acute on chronic diastolic congestive heart failure. Monitor for any signs of fluid overload. Continue Coreg 6.25mg  BID  Filed Weights   06/03/17 1510 06/03/17 1935 06/04/17 0537  Weight: 126.9 kg (279 lb 12.2 oz) 122.9 kg (270 lb 15.1 oz) 123 kg (271 lb 2.7 oz)     Volume  management per nephrology.    12.Rheumatoid arthritis. Prednisone 10 mg daily. Patient had been receiving Abataceptevery Tuesday prior to admission. Added Voltaren gel. Follow-up rheumatologist Dr.Ziokowska   MRI right knee reviewed, showing OA, meniscal tear and patellar tendinosis on right.  Per Ortho, braces PRN   DMARD resumed on 3/5    Started on high-dose 6 week steroid taper on 3/6  13.Atrial fibrillation RVR. Status post cardioversion. Amiodarone decreased to 200 mg daily 3/3 14.Leukocytosis.     Afebrile   WBCs 21.4 on 3/21, likely steroid related   Hand films reviewed, suggesting RA changes    Continue to monitor   Cont to monitor 15.  Left hand numbness   Likely related to fistula placement +/- RA 16. Labile blood pressure   Vitals:   06/03/17 1938 06/04/17 0537  BP: 135/85 113/71  Pulse: 78 100  Resp:  17  Temp:  98.5 F (36.9 C)  SpO2:  98%    Partly related to hemodialysis 17. 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 Constipation with gas   Bowel regimen increased on 3/7   Simethicone started on 3/14  LOS (Days) 22 A FACE TO FACE EVALUATION WAS PERFORMED  Mindy Behnken Lorie Phenix 06/04/2017 9:54 AM

## 2017-06-04 NOTE — Anesthesia Procedure Notes (Signed)
Procedure Name: Intubation Date/Time: 06/04/2017 12:33 PM Performed by: Myna Bright, CRNA Pre-anesthesia Checklist: Patient identified, Emergency Drugs available, Suction available and Patient being monitored Patient Re-evaluated:Patient Re-evaluated prior to induction Oxygen Delivery Method: Circle system utilized Preoxygenation: Pre-oxygenation with 100% oxygen Induction Type: IV induction Ventilation: Mask ventilation without difficulty and Oral airway inserted - appropriate to patient size Laryngoscope Size: Glidescope and 4 Tube type: Oral Tube size: 8.0 mm Number of attempts: 1 Airway Equipment and Method: Stylet and Video-laryngoscopy Placement Confirmation: ETT inserted through vocal cords under direct vision,  positive ETCO2 and breath sounds checked- equal and bilateral Secured at: 22 cm Tube secured with: Tape Dental Injury: Teeth and Oropharynx as per pre-operative assessment  Difficulty Due To: Difficulty was anticipated and Difficult Airway- due to limited oral opening Future Recommendations: Recommend- induction with short-acting agent, and alternative techniques readily available Comments: Easy mask airway with oral airway in place. DL with MAC 4 blade, and view of epiglottis only. DL with Glidescope, good view with Glide. Atraumatic oral intubation.

## 2017-06-04 NOTE — Transfer of Care (Signed)
Immediate Anesthesia Transfer of Care Note  Patient: John Bailey  Procedure(s) Performed: DEBRIDMENT OF SACRAL DECUBITUS ULCER (N/A )  Patient Location: PACU  Anesthesia Type:General  Level of Consciousness: awake and alert   Airway & Oxygen Therapy: Patient Spontanous Breathing and Patient connected to nasal cannula oxygen  Post-op Assessment: Report given to RN and Post -op Vital signs reviewed and stable  Post vital signs: Reviewed and stable  Last Vitals:  Vitals Value Taken Time  BP 138/87 06/04/2017  1:30 PM  Temp 36.6 C 06/04/2017  1:30 PM  Pulse 90 06/04/2017  1:30 PM  Resp 14 06/04/2017  1:40 PM  SpO2 100 % 06/04/2017  1:30 PM  Vitals shown include unvalidated device data.  Last Pain:  Vitals:   06/04/17 1330  TempSrc:   PainSc: Asleep      Patients Stated Pain Goal: 2 (84/72/07 2182)  Complications: No apparent anesthesia complications

## 2017-06-04 NOTE — Op Note (Signed)
06/04/2017  1:17 PM  PATIENT:  John Bailey  47 y.o. male  PRE-OPERATIVE DIAGNOSIS:  ulcer  POST-OPERATIVE DIAGNOSIS:  ulcer  PROCEDURE:  Procedure(s): DEBRIDMENT OF SACRAL DECUBITUS ULCER 8X5X4CM SURGEON:  Surgeon(s): Georganna Skeans, MD  ASSISTANTS: Jackson Latino, PAC   ANESTHESIA:   general  EBL:  10cc BLOOD ADMINISTERED:none  DRAINS: none   SPECIMEN:  Excision  DISPOSITION OF SPECIMEN:  PATHOLOGY  COUNTS:  YES  DICTATION: .Dragon Dictation 1.  Procedure in detail: John Bailey presents for debridement of sacral decubitus ulcer.  He was identified in the preop area.  Informed consent was obtained.  He received intravenous antibiotics.  He was brought to the operating room and general endotracheal anesthesia was administered by the anesthesia staff.  He was placed in prone position.  His wound was prepped and draped in sterile fashion.  We did a timeout procedure.  The medial and lateral wall of his wound had necrotic tissue and there was some significant necrotic tissue with a little bit of purulent material down in the base of the wound.  The medial lateral wall were debrided using cautery back to viable tissue.  The base of the wound was treated with scissors back to viable tissue.  Hemostasis was obtained with cautery.  Wound was copiously irrigated with saline.  Once hemostasis was insured and all nonviable tissue was removed, the wound was packed with saline soaked Kerlix and a sterile dressing was applied.  He tolerated the procedure without apparent complication was taken recovery in stable condition.  2.  Tool used for debridement (curette, scapel, etc.)  Cautery and scissors  3.  Frequency of surgical debridement.   First time  4.  Measurement of total devitalized tissue (wound surface) before and after surgical debridement.  8cmx5cmx4cm deep before and after  5.  Area and depth of devitalized tissue removed from wound. 8cmx5cmx4cm  6.  Blood loss and description of  tissue removed.  EBL 10cc, dead tissue with some purulence  7.  Evidence of the progress of the wound's response to treatment.  A.  Current wound volume (current dimensions and depth).  8cmx5cmx4cm deep  B.  Presence (and extent of) of infection.  present  C.  Presence (and extent of) of non viable tissue.  present  D.  Other material in the wound that is expected to inhibit healing.  no  8.  Was there any viable tissue removed (measurements): minimal  PATIENT DISPOSITION:  PACU - hemodynamically stable.   Delay start of Pharmacological VTE agent (>24hrs) due to surgical blood loss or risk of bleeding:  no  Georganna Skeans, MD, MPH, FACS Pager: [phone]  3/22/20191:17 PM

## 2017-06-04 NOTE — Anesthesia Preprocedure Evaluation (Addendum)
  Anesthesia Plan  ASA: IV  Anesthesia Plan: General   Post-op Pain Management:    Induction: Intravenous  PONV Risk Score and Plan: 2 and Ondansetron, Dexamethasone and Treatment may vary due to age or medical condition  Airway Management Planned: Oral ETT and Video Laryngoscope Planned  Additional Equipment:   Intra-op Plan:   Post-operative Plan: Possible Post-op intubation/ventilation  Informed Consent: I have reviewed the patients History and Physical, chart, labs and discussed the procedure including the risks, benefits and alternatives for the proposed anesthesia with the patient or authorized representative who has indicated his/her understanding and acceptance.   Dental advisory given  Plan Discussed with: CRNA, Surgeon and Anesthesiologist  Anesthesia Plan Comments: (Grade 3 view with Mac 4 last procedure, intubated with glidescope. Will go straight to glidescope this time. Patient arousable, but very sleepy. Plan discussed with wife at bedside.)      Anesthesia Quick Evaluation                                    Anesthesia Evaluation  Patient identified by MRN, date of birth, ID bandGeneral Assessment Comment:Atrial fibrillation with RVR: On review of prior ECGs, last time he was documented to be in NSR was in 1/18. I suspect that he may be better compensated in NSR. His HR has been difficult to control.  He remains in NSR after DCCV on 2/15.    He is on heparin gtt.  When procedures are completed (vascular access),  transition to Eliquis 5 mg bid based on most recent afib guidelines.  Acute on chronic diastolic CHF: Echo this admission with severe LVH, EF 55-60%. Marked volume overload with anasarca. Fluid now coming off with HD but he has a long way to go.   Continue HD per renal.  No change. AKI on CKD Stage IV: I suspect that he will be dialysis dependent for volume management. No change.   HTN: Improved with volume removal. SBP  110's      Reviewed: Allergy & Precautions, NPO status , Patient's Chart, lab work & pertinent test results  Airway Mallampati: II  TM Distance: >3 FB Neck ROM: Full    Dental no notable dental hx.    Pulmonary neg pulmonary ROS,    breath sounds clear to auscultation- rhonchi + decreased breath sounds      Cardiovascular hypertension, Pt. on medications +CHF  Normal cardiovascular exam+ dysrhythmias Atrial Fibrillation  Rhythm:Regular Rate:Normal  '19 TTE - Severe LVH. EF of 55% to 60%. Mild MR. Moderately dilated LA. Trivial posterior pericardial effusion. Previous echo (2018) showed grade 3 diastolic dysfunction. Not evaluated on 2019 echo.   Neuro/Psych Altered mental status negative psych ROS   GI/Hepatic negative GI ROS, Neg liver ROS,   Endo/Other  diabetesObesity  Renal/GU ESRF and DialysisRenal disease     Musculoskeletal  (+) Arthritis , Rheumatoid disorders,    Abdominal (+) + obese,   Peds negative pediatric ROS (+)  Hematology  (+) anemia ,   Anesthesia Other Findings   Reproductive/Obstetrics negative OB ROS                           Anesthesia Physical

## 2017-06-04 NOTE — Progress Notes (Signed)
CBG upon arrival from OR 65. CRNA Richard at bedside gave 1/2ampule Dextrose. Recheck of CBG at 1353 72. Dr Glennon Mac aware.

## 2017-06-04 NOTE — Anesthesia Preprocedure Evaluation (Signed)
  Anesthesia Plan  ASA: IV and emergent  Anesthesia Plan: General   Post-op Pain Management:    Induction: Intravenous  PONV Risk Score and Plan: 2 and Ondansetron  Airway Management Planned: Oral ETT  Additional Equipment:   Intra-op Plan:   Post-operative Plan: Extubation in OR and Possible Post-op intubation/ventilation  Informed Consent: I have reviewed the patients History and Physical, chart, labs and discussed the procedure including the risks, benefits and alternatives for the proposed anesthesia with the patient or authorized representative who has indicated his/her understanding and acceptance.   Dental advisory given  Plan Discussed with: CRNA, Surgeon and Anesthesiologist  Anesthesia Plan Comments: (Very tight IVF control )        Anesthesia Quick Evaluation                                    Anesthesia Evaluation  Patient identified by MRN, date of birth, ID band Patient awake  General Assessment Comment:Atrial fibrillation with RVR: On review of prior ECGs, last time he was documented to be in NSR was in 1/18. I suspect that he may be better compensated in NSR. His HR has been difficult to control.  He remains in NSR after DCCV on 2/15.    He is on heparin gtt.  When procedures are completed (vascular access),  transition to Eliquis 5 mg bid based on most recent afib guidelines.  Acute on chronic diastolic CHF: Echo this admission with severe LVH, EF 55-60%. Marked volume overload with anasarca. Fluid now coming off with HD but he has a long way to go.   Continue HD per renal.  No change. AKI on CKD Stage IV: I suspect that he will be dialysis dependent for volume management. No change.   HTN: Improved with volume removal. SBP 110's      Reviewed: Allergy & Precautions, NPO status , Patient's Chart, lab work & pertinent test results  Airway Mallampati: III  TM Distance: >3 FB Neck ROM: Full    Dental no notable dental hx.     Pulmonary neg pulmonary ROS,    breath sounds clear to auscultation + decreased breath sounds      Cardiovascular hypertension, +CHF  Normal cardiovascular exam+ dysrhythmias Atrial Fibrillation  Rhythm:Regular Rate:Normal  Echo 2/19  Study Conclusions  - Left ventricle: The cavity size was normal. Wall thickness was   increased in a pattern of severe LVH. Systolic function was   normal. The estimated ejection fraction was in the range of 55%   to 60%. The study is not technically sufficient to allow   evaluation of LV diastolic function. - Mitral valve: There was mild regurgitation. - Left atrium: The atrium was moderately dilated. - Pericardium, extracardiac: A trivial pericardial effusion was   identified posterior to the heart.    Neuro/Psych negative neurological ROS  negative psych ROS   GI/Hepatic negative GI ROS, Neg liver ROS,   Endo/Other  diabetesMorbid obesity  Renal/GU Dialyzed this am >3L removed     Musculoskeletal negative musculoskeletal ROS (+)   Abdominal (+) + obese,   Peds negative pediatric ROS (+)  Hematology  (+) anemia ,   Anesthesia Other Findings   Reproductive/Obstetrics negative OB ROS                              Anesthesia Physical

## 2017-06-04 NOTE — Progress Notes (Signed)
   Subjective/Chief Complaint: No changes   Objective: Vital signs in last 24 hours: Temp:  [97.6 F (36.4 C)-98.5 F (36.9 C)] 98.5 F (36.9 C) (03/22 0537) Pulse Rate:  [78-100] 100 (03/22 0537) Resp:  [16-18] 17 (03/22 0537) BP: (82-152)/(36-91) 113/71 (03/22 0537) SpO2:  [98 %] 98 % (03/22 0537) Weight:  [122.9 kg (270 lb 15.1 oz)-126.9 kg (279 lb 12.2 oz)] 123 kg (271 lb 2.7 oz) (03/22 0537) Last BM Date: 06/02/17  Intake/Output from previous day: 03/21 0701 - 03/22 0700 In: 120 [P.O.:120] Out: 4100 [Urine:100] Intake/Output this shift: No intake/output data recorded.  General appearance: alert and cooperative Resp: clear to auscultation bilaterally sacral wound dressed  Lab Results:  Recent Labs    06/01/17 1708 06/03/17 1047  WBC 23.6* 21.4*  HGB 9.4* 9.6*  HCT 29.2* 30.4*  PLT 493* 368   BMET Recent Labs    06/01/17 1708 06/03/17 1530  NA 134* 134*  K 3.6 3.6  CL 96* 98*  CO2 23 23  GLUCOSE 111* 269*  BUN 128* 110*  CREATININE 7.22* 6.37*  CALCIUM 9.2 8.6*   PT/INR No results for input(s): LABPROT, INR in the last 72 hours. ABG No results for input(s): PHART, HCO3 in the last 72 hours.  Invalid input(s): PCO2, PO2  Studies/Results: No results found.  Anti-infectives: Anti-infectives (From admission, onward)   Start     Dose/Rate Route Frequency Ordered Stop   06/04/17 0800  ceFAZolin (ANCEF) IVPB 2g/100 mL premix     2 g 200 mL/hr over 30 Minutes Intravenous  Once 06/03/17 0753     05/19/17 1000  acyclovir (ZOVIRAX) 200 MG capsule 200 mg     200 mg Oral 2 times daily 05/19/17 2641 05/25/17 2236      Assessment/Plan: Sacral decub - to OR for debridement. Procedure, risks, and benefits discussed. He agrees.  LOS: 22 days    Zenovia Jarred 06/04/2017

## 2017-06-04 NOTE — Progress Notes (Signed)
[facility] ROUNDING NOTE   Subjective:    New ESRD to be placed at [facility]  TTS shift  He continues in therapy  Debility secondary to cardiorenal syndrome and is scheduled for debridement of sacral wound. Has history of rheumatoid arthritis and continues on Prednisone 10 mg daily  He also has atrial fibrillation and is taking amiodarone  He has a fistula placed  Plan dialysis in AM  Objective:  Vital signs in last 24 hours:  Temp:  [97.6 F (36.4 C)-98.5 F (36.9 C)] 98.5 F (36.9 C) (03/22 0537) Pulse Rate:  [78-100] 100 (03/22 0537) Resp:  [16-18] 17 (03/22 0537) BP: (113-152)/(71-91) 113/71 (03/22 0537) SpO2:  [98 %] 98 % (03/22 0537) Weight:  [270 lb 15.1 oz (122.9 kg)-279 lb 12.2 oz (126.9 kg)] 277 lb 12.5 oz (126 kg) (03/22 1043)  Weight change: 0 lb (0 kg) Filed Weights   06/03/17 1935 06/04/17 0537 06/04/17 1043  Weight: 270 lb 15.1 oz (122.9 kg) 271 lb 2.7 oz (123 kg) 277 lb 12.5 oz (126 kg)    Intake/Output: I/O last 3 completed shifts: In: 120 [P.O.:120] Out: 4100 [Urine:100; Other:4000]   Intake/Output this shift:  No intake/output data recorded.  CVS- RRR RS- CTA ABD- BS present soft non-distended EXT-  1 +  edema   Basic Metabolic Panel: Recent Labs  Lab 05/29/17 1406 06/01/17 1708 06/03/17 1530  NA 132* 134* 134*  K 3.2* 3.6 3.6  CL 94* 96* 98*  CO2 22 23 23   GLUCOSE 135* 111* 269*  BUN 103* 128* 110*  CREATININE 6.52* 7.22* 6.37*  CALCIUM 8.6* 9.2 8.6*  PHOS 5.1* 4.4 4.6    Liver Function Tests: Recent Labs  Lab 05/29/17 1406 06/01/17 1708 06/03/17 1530  ALBUMIN 2.5* 2.8* 2.5*   No results for input(s): LIPASE, AMYLASE in the last 168 hours. No results for input(s): AMMONIA in the last 168 hours.  CBC: Recent Labs  Lab 05/29/17 1355 06/01/17 1708 06/03/17 1047  WBC 22.4* 23.6* 21.4*  NEUTROABS  --   --  18.4*  HGB 8.8* 9.4* 9.6*  HCT 27.4* 29.2* 30.4*  MCV 85.6 86.4 88.1  PLT 508* 493* 368     Cardiac Enzymes: No results for input(s): CKTOTAL, CKMB, CKMBINDEX, TROPONINI in the last 168 hours.  BNP: Invalid input(s): POCBNP  CBG: Recent Labs  Lab 06/03/17 1248 06/03/17 2125 06/04/17 0645 06/04/17 0805 06/04/17 0959  GLUCAP 144* 144* 86 88 70    Microbiology: Results for orders placed or performed during the hospital encounter of 04/22/17  Respiratory Panel by PCR     Status: None   Collection Time: 04/25/17 10:50 AM  Result Value Ref Range Status   Adenovirus NOT DETECTED NOT DETECTED Final   Coronavirus 229E NOT DETECTED NOT DETECTED Final   Coronavirus HKU1 NOT DETECTED NOT DETECTED Final   Coronavirus NL63 NOT DETECTED NOT DETECTED Final   Coronavirus OC43 NOT DETECTED NOT DETECTED Final   Metapneumovirus NOT DETECTED NOT DETECTED Final   Rhinovirus / Enterovirus NOT DETECTED NOT DETECTED Final   Influenza A NOT DETECTED NOT DETECTED Final   Influenza A H1 NOT DETECTED NOT DETECTED Final   Influenza A H1 2009 NOT DETECTED NOT DETECTED Final   Influenza A H3 NOT DETECTED NOT DETECTED Final   Influenza B NOT DETECTED NOT DETECTED Final   Parainfluenza Virus 1 NOT DETECTED NOT DETECTED Final   Parainfluenza Virus 2 NOT DETECTED NOT DETECTED Final   Parainfluenza Virus 3 NOT DETECTED  NOT DETECTED Final   Parainfluenza Virus 4 NOT DETECTED NOT DETECTED Final   Respiratory Syncytial Virus NOT DETECTED NOT DETECTED Final   Bordetella pertussis NOT DETECTED NOT DETECTED Final   Chlamydophila pneumoniae NOT DETECTED NOT DETECTED Final   Mycoplasma pneumoniae NOT DETECTED NOT DETECTED Final    Comment: Performed at [facility], [place] [address], [place], [place] [address]  Culture, blood (routine x 2)     Status: None   Collection Time: 04/26/17 12:10 AM  Result Value Ref Range Status   Specimen Description BLOOD LEFT HAND  Final   Special Requests IN PEDIATRIC BOTTLE Blood Culture adequate volume  Final   Culture   Final    NO GROWTH 5 DAYS Performed  at [facility], [place] [address] [place], [place] [address]    Report Status 05/01/2017 FINAL  Final  Culture, blood (routine x 2)     Status: None   Collection Time: 04/26/17 12:18 AM  Result Value Ref Range Status   Specimen Description BLOOD LEFT HAND  Final   Special Requests IN PEDIATRIC BOTTLE Blood Culture adequate volume  Final   Culture   Final    NO GROWTH 5 DAYS Performed at [facility], [place] [address] [place], [place] [address]    Report Status 05/01/2017 FINAL  Final  Gastrointestinal Panel by PCR , Stool     Status: None   Collection Time: 05/02/17  2:42 PM  Result Value Ref Range Status   Campylobacter species NOT DETECTED NOT DETECTED Final   Plesimonas shigelloides NOT DETECTED NOT DETECTED Final   Salmonella species NOT DETECTED NOT DETECTED Final   Yersinia enterocolitica NOT DETECTED NOT DETECTED Final   Vibrio species NOT DETECTED NOT DETECTED Final   Vibrio cholerae NOT DETECTED NOT DETECTED Final   Enteroaggregative E coli (EAEC) NOT DETECTED NOT DETECTED Final   Enteropathogenic E coli (EPEC) NOT DETECTED NOT DETECTED Final   Enterotoxigenic E coli (ETEC) NOT DETECTED NOT DETECTED Final   Shiga like toxin producing E coli (STEC) NOT DETECTED NOT DETECTED Final   Shigella/Enteroinvasive E coli (EIEC) NOT DETECTED NOT DETECTED Final   Cryptosporidium NOT DETECTED NOT DETECTED Final   Cyclospora cayetanensis NOT DETECTED NOT DETECTED Final   Entamoeba histolytica NOT DETECTED NOT DETECTED Final   Giardia lamblia NOT DETECTED NOT DETECTED Final   Adenovirus F40/41 NOT DETECTED NOT DETECTED Final   Astrovirus NOT DETECTED NOT DETECTED Final   Norovirus GI/GII NOT DETECTED NOT DETECTED Final   Rotavirus A NOT DETECTED NOT DETECTED Final   Sapovirus (I, II, IV, and V) NOT DETECTED NOT DETECTED Final    Comment: Performed at [facility], [place] [place], [place] [address]  C difficile quick scan w PCR reflex     Status: None    Collection Time: 05/02/17  2:42 PM  Result Value Ref Range Status   C Diff antigen NEGATIVE NEGATIVE Final   C Diff toxin NEGATIVE NEGATIVE Final   C Diff interpretation No C. difficile detected.  Final    Comment: Performed at [facility], [place] [address] [place], [place] [address]  Surgical pcr screen     Status: None   Collection Time: 05/04/17  6:53 AM  Result Value Ref Range Status   MRSA, PCR NEGATIVE NEGATIVE Final   Staphylococcus aureus NEGATIVE NEGATIVE Final    Comment: (NOTE) The Xpert SA Assay (FDA approved for NASAL specimens in patients 22 years of  age and older), is one component of a comprehensive surveillance program. It is not intended to diagnose infection nor to guide or monitor treatment. Performed at [facility], [place] [address] [place], [place] [address]     Coagulation Studies: No results for input(s): LABPROT, INR in the last 72 hours.  Urinalysis: No results for input(s): COLORURINE, LABSPEC, PHURINE, GLUCOSEU, HGBUR, BILIRUBINUR, KETONESUR, PROTEINUR, UROBILINOGEN, NITRITE, LEUKOCYTESUR in the last 72 hours.  Invalid input(s): APPERANCEUR    Imaging: No results found.   Medications:   . sodium chloride 10 mL/hr at 06/04/17 1045  .  ceFAZolin (ANCEF) IV    . [MAR Hold] ferric gluconate (FERRLECIT/NULECIT) IV Stopped (06/03/17 1900)   . [MAR Hold] Abatacept  125 mg Subcutaneous Q Tue  . [MAR Hold] amiodarone  200 mg Oral Daily  . [MAR Hold] calcitRIOL  0.5 mcg Oral Q T,Th,Sa-HD  . [MAR Hold] calcium acetate  1,334 mg Oral TID WC  . [MAR Hold] capsaicin   Topical BID  . [MAR Hold] collagenase   Topical Daily  . [MAR Hold] darbepoetin (ARANESP) injection - DIALYSIS  150 mcg Intravenous Q Thu-HD  . [MAR Hold] feeding supplement (NEPRO CARB STEADY)  237 mL Oral BID BM  . [MAR Hold] feeding supplement (PRO-STAT SUGAR FREE 64)  30 mL Oral BID  . [MAR Hold] fluticasone  2 spray Each Nare Daily  . [MAR Hold] gabapentin  300 mg Oral  Daily  . [MAR Hold] Gerhardt's butt cream   Topical BID  . [MAR Hold] insulin aspart  0-15 Units Subcutaneous TID WC  . [MAR Hold] insulin aspart  10 Units Subcutaneous TID WC  . [MAR Hold] insulin glargine  30 Units Subcutaneous QHS  . [MAR Hold] lidocaine  3 patch Transdermal Q24H  . [MAR Hold] multivitamin  1 tablet Oral QHS  . [MAR Hold] predniSONE  40 mg Oral Q breakfast   Followed by  . [MAR Hold] predniSONE  30 mg Oral Q breakfast   Followed by  . [MAR Hold] predniSONE  20 mg Oral Q breakfast   Followed by  . [MAR Hold] predniSONE  10 mg Oral Q breakfast  . [MAR Hold] senna-docusate  2 tablet Oral QHS  . 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

## 2017-06-05 ENCOUNTER — Inpatient Hospital Stay (HOSPITAL_COMMUNITY): Payer: BLUE CROSS/BLUE SHIELD | Admitting: Occupational Therapy

## 2017-06-05 ENCOUNTER — Encounter (HOSPITAL_COMMUNITY): Payer: Self-pay | Admitting: General Surgery

## 2017-06-05 LAB — RENAL FUNCTION PANEL
Albumin: 2.6 g/dL — ABNORMAL LOW (ref 3.5–5.0)
Anion gap: 17 — ABNORMAL HIGH (ref 5–15)
BUN: 63 mg/dL — ABNORMAL HIGH (ref 6–20)
CALCIUM: 8.3 mg/dL — AB (ref 8.9–10.3)
CHLORIDE: 98 mmol/L — AB (ref 101–111)
CO2: 19 mmol/L — AB (ref 22–32)
CREATININE: 5.45 mg/dL — AB (ref 0.61–1.24)
GFR calc Af Amer: 13 mL/min — ABNORMAL LOW (ref >60)
GFR calc non Af Amer: 12 mL/min — ABNORMAL LOW (ref >60)
GLUCOSE: 128 mg/dL — AB (ref 65–99)
Phosphorus: 4.2 mg/dL (ref 2.5–4.6)
Potassium: 3.5 mmol/L (ref 3.5–5.1)
SODIUM: 134 mmol/L — AB (ref 135–145)

## 2017-06-05 LAB — CBC
HCT: 30.2 % — ABNORMAL LOW (ref 39.0–52.0)
Hemoglobin: 9.4 g/dL — ABNORMAL LOW (ref 13.0–17.0)
MCH: 28 pg (ref 26.0–34.0)
MCHC: 31.1 g/dL (ref 30.0–36.0)
MCV: 89.9 fL (ref 78.0–100.0)
PLATELETS: 296 10*3/uL (ref 150–400)
RBC: 3.36 MIL/uL — AB (ref 4.22–5.81)
RDW: 23.7 % — ABNORMAL HIGH (ref 11.5–15.5)
WBC: 22.3 10*3/uL — ABNORMAL HIGH (ref 4.0–10.5)

## 2017-06-05 LAB — GLUCOSE, CAPILLARY
GLUCOSE-CAPILLARY: 165 mg/dL — AB (ref 65–99)
GLUCOSE-CAPILLARY: 53 mg/dL — AB (ref 65–99)
GLUCOSE-CAPILLARY: 136 mg/dL — AB (ref 65–99)
Glucose-Capillary: 128 mg/dL — ABNORMAL HIGH (ref 65–99)
Glucose-Capillary: 143 mg/dL — ABNORMAL HIGH (ref 65–99)

## 2017-06-05 MED ORDER — GLUCOSE 40 % PO GEL
ORAL | Status: AC
  Administered 2017-06-05: 37.5 g
  Filled 2017-06-05: qty 1

## 2017-06-05 MED ORDER — ACETAMINOPHEN 325 MG PO TABS
ORAL_TABLET | ORAL | Status: AC
  Filled 2017-06-05: qty 2

## 2017-06-05 MED ORDER — GLUCOSE 40 % PO GEL
1.0000 | Freq: Once | ORAL | Status: AC | PRN
  Administered 2017-06-05: 37.5 g via ORAL

## 2017-06-05 NOTE — Progress Notes (Signed)
Hypoglycemic Event  CBG: 53  Treatment: 15 GM gel  Symptoms: Hungry  Follow-up CBG: [id number] CBG Result:143  Possible Reasons for Event: Unknown  Comments/MD notified:Yes    Milus Mallick

## 2017-06-05 NOTE — Progress Notes (Signed)
1 Day Post-Op   Subjective/Chief Complaint: Reports the RN changed his dressing last night, dong well   Objective: Vital signs in last 24 hours: Temp:  [97.9 F (36.6 C)-98.6 F (37 C)] 98.6 F (37 C) (03/23 0207) Pulse Rate:  [87-101] 101 (03/23 0207) Resp:  [11-24] 16 (03/23 0207) BP: (112-138)/(68-87) 135/84 (03/23 0207) SpO2:  [98 %-100 %] 100 % (03/23 0207) Weight:  [126 kg (277 lb 12.5 oz)] 126 kg (277 lb 12.5 oz) (03/22 1043) Last BM Date: 06/02/17  Intake/Output from previous day: 03/22 0701 - 03/23 0700 In: 770 [I.V.:520; IV Piggyback:250] Out: 0  Intake/Output this shift: No intake/output data recorded.  Incision/Wound: wound looks clean, odor much better  Lab Results:  Recent Labs    06/03/17 1047 06/04/17 1210  WBC 21.4*  --   HGB 9.6* 9.2*  HCT 30.4* 27.0*  PLT 368  --    BMET Recent Labs    06/03/17 1530 06/04/17 1210  NA 134* 140  K 3.6 2.9*  CL 98*  --   CO2 23  --   GLUCOSE 269* 67  BUN 110*  --   CREATININE 6.37*  --   CALCIUM 8.6*  --    PT/INR No results for input(s): LABPROT, INR in the last 72 hours. ABG No results for input(s): PHART, HCO3 in the last 72 hours.  Invalid input(s): PCO2, PO2  Studies/Results: No results found.  Anti-infectives: Anti-infectives (From admission, onward)   Start     Dose/Rate Route Frequency Ordered Stop   06/04/17 0800  ceFAZolin (ANCEF) IVPB 2g/100 mL premix     2 g 200 mL/hr over 30 Minutes Intravenous  Once 06/03/17 0753 06/04/17 1240   05/19/17 1000  acyclovir (ZOVIRAX) 200 MG capsule 200 mg     200 mg Oral 2 times daily 05/19/17 6803 05/25/17 2236      Assessment/Plan: S/P debridement sacral decub 3/22 [place] Silos) - continue wet to dry dressings BID. We will check 3/25. Likely can transition to [facility] RN daily dressing change early next week.  LOS: 23 days    Zenovia Jarred 06/05/2017

## 2017-06-05 NOTE — Progress Notes (Signed)
Occupational Therapy Session Note  Patient Details  Name: John Bailey MRN: [id number] Date of Birth: April 29, 1970  Today's Date: 06/05/2017 OT Individual Time: 1050-1135 OT Individual Time Calculation (min): 45 min    Short Term Goals: Week 3:  OT Short Term Goal 1 (Week 3): Pt will continue working on updated LTGs set at min assist.   Skilled Therapeutic Interventions/Progress Updates:    Pt received in w/c stating he was very worried about his L hand as it was painful and swollen.   Pt was sitting with hand in a dependent position and wrist flexed. He did not know where his compression glove was and did not feel wrist splints were helpful.  Educated pt on importance of positioning hand and elevation.  Used coban wrap on pt's hand for light compression and pt stated it was helpful.  Pt agreeable to a bath and dressing but needed more A today due to painful L arm.  Min A with UB bathing to wash under R arm, min A to pull pants over hips due to limited hand grasp.  Pt set up in w/c at sink to complete grooming tasks.  Nurse tech and RN aware pt is at the sink.  Therapy Documentation Precautions:  Precautions Precautions: Fall Precaution Comments: Severe RA in shoulders, left hand Required Braces or Orthoses: Other Brace/Splint Other Brace/Splint: R ankle brace Restrictions Weight Bearing Restrictions: No    Vital Signs: Therapy Vitals Pulse Rate: 99 BP: 140/83 Pain:   ADL:   Vision   Perception    Praxis   Exercises:   Other Treatments:    See Function Navigator for Current Functional Status.   Therapy/Group: Individual Therapy  [place] 06/05/2017, 12:00 PM

## 2017-06-05 NOTE — Progress Notes (Signed)
Hydrotherapy Note  Patient Details  Name: John Bailey MRN: [id number] Date of Birth: 09-09-1970 Today's Date: 06/05/2017    Pt underwent surgical debridement yesterday. Pt now for wet to dry dressing changes BID. Will defer further hydrotherapy. Please reorder hydrotherapy if needed.   Shary Decamp Maycok 06/05/2017, 8:51 AM  Suanne Marker PT [phone]

## 2017-06-05 NOTE — Progress Notes (Signed)
John Bailey is a 47 y.o. male Jan 06, 1971 [id number]  Subjective: Continued pain in left hand - worse since return from OR for decub debridement yesterday. ?if steroids have been resumed. No other new problems.   Objective: Vital signs in last 24 hours: Temp:  [97.9 F (36.6 C)-98.6 F (37 C)] 98.6 F (37 C) (03/23 0207) Pulse Rate:  [87-101] 99 (03/23 0842) Resp:  [11-24] 16 (03/23 0207) BP: (112-140)/(68-87) 140/83 (03/23 0842) SpO2:  [98 %-100 %] 100 % (03/23 0207) Weight:  [126 kg (277 lb 12.5 oz)] 126 kg (277 lb 12.5 oz) (03/22 1043) Weight change: -0.9 kg (-1 lb 15.8 oz) Last BM Date: 06/02/17  Intake/Output from previous day: 03/22 0701 - 03/23 0700 In: 770 [I.V.:520; IV Piggyback:250] Out: 0   Physical Exam General: No apparent distress   At sink in [facility] Lungs: Normal effort. Lungs clear to auscultation, no crackles or wheezes. Cardiovascular: Regular rate and rhythm, no edema Musculoskeletal:  Soft tissue edema L>R hands, no abnormal warmth or redness Neurological: No new neurological deficits Wounds: sacral area not examined by me.  Lab Results: BMET    Component Value Date/Time   NA 134 (L) 06/05/2017 0803   K 3.5 06/05/2017 0803   CL 98 (L) 06/05/2017 0803   CO2 19 (L) 06/05/2017 0803   GLUCOSE 128 (H) 06/05/2017 0803   BUN 63 (H) 06/05/2017 0803   CREATININE 5.45 (H) 06/05/2017 0803   CALCIUM 8.3 (L) 06/05/2017 0803   GFRNONAA 12 (L) 06/05/2017 0803   GFRAA 13 (L) 06/05/2017 0803   CBC    Component Value Date/Time   WBC 22.3 (H) 06/05/2017 0803   RBC 3.36 (L) 06/05/2017 0803   HGB 9.4 (L) 06/05/2017 0803   HCT 30.2 (L) 06/05/2017 0803   PLT 296 06/05/2017 0803   MCV 89.9 06/05/2017 0803   MCH 28.0 06/05/2017 0803   MCHC 31.1 06/05/2017 0803   RDW 23.7 (H) 06/05/2017 0803   LYMPHSABS 2.4 06/03/2017 1047   MONOABS 0.6 06/03/2017 1047   EOSABS 0.0 06/03/2017 1047   BASOSABS 0.0 06/03/2017 1047   CBG's (last 3):   Recent Labs     06/04/17 1648 06/04/17 2101 06/05/17 0637  GLUCAP 120* 112* 165*   LFT's Lab Results  Component Value Date   ALT 12 (L) 05/22/2017   AST 12 (L) 05/05/2017   ALKPHOS 74 05/05/2017   BILITOT 0.6 05/05/2017    Studies/Results: No results found.  Medications:  I have reviewed the patient's current medications. Scheduled Medications: . amiodarone  200 mg Oral Daily  . apixaban  5 mg Oral BID  . calcitRIOL  0.5 mcg Oral Q T,Th,Sat-1800  . carvedilol  6.25 mg Oral BID WC  . gabapentin  300 mg Oral Daily  . insulin aspart  3 Units Subcutaneous TID WC  . insulin glargine  30 Units Subcutaneous QHS   PRN Medications: sodium chloride, sodium chloride, acetaminophen, alteplase, heparin, lidocaine (PF), lidocaine-prilocaine, methocarbamol, pentafluoroprop-tetrafluoroeth, sorbitol  Assessment/Plan: Principal Problem:   Debility Active Problems:   Cardiorenal syndrome with renal failure   Pain   PAF (paroxysmal atrial fibrillation) (HCC)   Acute on chronic diastolic (congestive) heart failure (HCC)   Type 2 diabetes mellitus with peripheral neuropathy (HCC)   Acute blood loss anemia   Anemia of chronic disease   ESRD on dialysis (HCC)   Carpal tunnel syndrome of left wrist   Labile blood pressure   Labile blood glucose   HSV (herpes simplex virus) infection  Neuropathic pain   Constipation   Steroid-induced hyperglycemia   Noncompliance   Pressure injury of sacral region, unstageable ([place])   1. Debility, multi factorial - continue CIR as ongoing 2. L hand pain - ?RA flare or neuropathic or other - on Neurontin, Voltaren gel, capsaicin cream and Lidoderm. Notes indicate resumed steroid taper and home pred 10mg  qd - will resume home pred unless on other dose 3. Sacral decub - s/p OR debridement 3/22 by gen surg - dressing change W>D as ordered BID 4. ESRD - HD TTS - per renal 5. DM2, hyperglycemia exac by steroids and stress - continue lantus and SSI with adjustments as  needed  Length of stay, days: 23    Valerie A. Asa Lente, MD 06/05/2017, 9:49 AM

## 2017-06-05 NOTE — Progress Notes (Addendum)
Assessment/Plan:  1. New ESRD2/2 DM/HTN. TTS at [facility] (but wants MWF).[name] Next HD Today, needs lower weight 2. AnemiaDarbeand iron 3. HPTHcalcitriol QOD -and binders 4. Afib on amio/apixaban 5. HTN/vol- Still tryingestablish an EDW, need to lower and increase time now 4:15hr 6.   RA - pred taper + topicals 7    Sacral decub debrid  Subjective: Interval History: C/o joint pains  Objective: Vital signs in last 24 hours: Temp:  [97.9 F (36.6 C)-98.6 F (37 C)] 98.6 F (37 C) (03/23 0207) Pulse Rate:  [87-101] 99 (03/23 0842) Resp:  [11-24] 16 (03/23 0207) BP: (112-140)/(68-87) 140/83 (03/23 0842) SpO2:  [98 %-100 %] 100 % (03/23 0207) Weight change: -0.9 kg (-1 lb 15.8 oz)  Intake/Output from previous day: 03/22 0701 - 03/23 0700 In: 770 [I.V.:520; IV Piggyback:250] Out: 0  Intake/Output this shift: Total I/O In: 360 [P.O.:360] Out: -   Resp: clear to auscultation bilaterally Chest wall: no tenderness Cardio: regular rate and rhythm, S1, S2 normal, no murmur, click, rub or gallop  Edema less but 1-2+  Lab Results: Recent Labs    06/03/17 1047 06/04/17 1210 06/05/17 0803  WBC 21.4*  --  22.3*  HGB 9.6* 9.2* 9.4*  HCT 30.4* 27.0* 30.2*  PLT 368  --  296   BMET:  Recent Labs    06/03/17 1530 06/04/17 1210 06/05/17 0803  NA 134* 140 134*  K 3.6 2.9* 3.5  CL 98*  --  98*  CO2 23  --  19*  GLUCOSE 269* 67 128*  BUN 110*  --  63*  CREATININE 6.37*  --  5.45*  CALCIUM 8.6*  --  8.3*   No results for input(s): PTH in the last 72 hours. Iron Studies: No results for input(s): IRON, TIBC, TRANSFERRIN, FERRITIN in the last 72 hours. Studies/Results: No results found.  Scheduled: . amiodarone  200 mg Oral Daily  . apixaban  5 mg Oral BID  . calcitRIOL  0.5 mcg Oral Q T,Th,Sat-1800  . carvedilol  6.25 mg Oral BID WC  . gabapentin  300 mg Oral Daily  . insulin aspart  3 Units Subcutaneous TID WC  . insulin glargine  30 Units Subcutaneous QHS       LOS: 23 days   Estanislado Emms 06/05/2017,11:16 AM

## 2017-06-06 ENCOUNTER — Inpatient Hospital Stay (HOSPITAL_COMMUNITY): Payer: BLUE CROSS/BLUE SHIELD

## 2017-06-06 LAB — GLUCOSE, CAPILLARY
GLUCOSE-CAPILLARY: 88 mg/dL (ref 65–99)
GLUCOSE-CAPILLARY: 246 mg/dL — AB (ref 65–99)
Glucose-Capillary: 173 mg/dL — ABNORMAL HIGH (ref 65–99)
Glucose-Capillary: 362 mg/dL — ABNORMAL HIGH (ref 65–99)

## 2017-06-06 MED ORDER — PREDNISONE 10 MG (21) PO TBPK
10.0000 mg | ORAL_TABLET | ORAL | Status: AC
  Administered 2017-06-06: 10 mg via ORAL

## 2017-06-06 MED ORDER — PREDNISONE 10 MG (21) PO TBPK
20.0000 mg | ORAL_TABLET | Freq: Every morning | ORAL | Status: AC
  Administered 2017-06-06: 20 mg via ORAL
  Filled 2017-06-06: qty 21

## 2017-06-06 MED ORDER — PREDNISONE 10 MG (21) PO TBPK
10.0000 mg | ORAL_TABLET | Freq: Three times a day (TID) | ORAL | Status: AC
  Administered 2017-06-07 (×3): 10 mg via ORAL

## 2017-06-06 MED ORDER — PREDNISONE 10 MG (21) PO TBPK
20.0000 mg | ORAL_TABLET | Freq: Every evening | ORAL | Status: AC
  Administered 2017-06-07: 20 mg via ORAL
  Filled 2017-06-06: qty 21

## 2017-06-06 MED ORDER — PREDNISONE 10 MG (21) PO TBPK: 10.0000 mg | ORAL_TABLET | Freq: Four times a day (QID) | ORAL | Status: DC

## 2017-06-06 MED ORDER — PREDNISONE 10 MG (21) PO TBPK
20.0000 mg | ORAL_TABLET | Freq: Every evening | ORAL | Status: AC
  Administered 2017-06-06: 20 mg via ORAL

## 2017-06-06 NOTE — Progress Notes (Signed)
Occupational Therapy Session Note  Patient Details  Name: DINH AYOTTE MRN: [id number] Date of Birth: 1970-12-03  Today's Date: 06/06/2017 OT Individual Time: 1300-1345 OT Individual Time Calculation (min): 45 min    Short Term Goals: Week 3:  OT Short Term Goal 1 (Week 3): Pt will continue working on updated LTGs set at min assist.   Skilled Therapeutic Interventions/Progress Updates:    1:1. Pt very lethargic throughout session reporting pain in BUE. Pt eats with HOH A to cut meat up with utensils with foam handles d/t weakness. Pt washes UB only up at sink with A to flip open body wash cap, and wash back. Pt brushes teeth with increased time and set up using teeth to open toothpaste. D/t time constraints, OT threads BLE into underwear and shorts. Pt sit to stand at sink stabilizing on forearms with MAX A and VC for posture as OT advances pants past hips. Exited session with pt seated in w/c call light in reach and all needs met  Therapy Documentation Precautions:  Precautions Precautions: Fall Precaution Comments: Severe RA in shoulders, left hand Required Braces or Orthoses: Other Brace/Splint Other Brace/Splint: R ankle brace Restrictions Weight Bearing Restrictions: No General:    See Function Navigator for Current Functional Status.   Therapy/Group: Individual Therapy  Tonny Branch 06/06/2017, 5:42 PM

## 2017-06-06 NOTE — Progress Notes (Signed)
[facility] ROUNDING NOTE   Subjective:    New ESRD to be placed at [facility]  TTS shift  He continues in therapy  Debility secondary to cardiorenal syndrome and is scheduled for debridement of sacral wound. Has history of rheumatoid arthritis and continues on Prednisone 10 mg daily  He also has atrial fibrillation and is taking amiodarone  He has a fistula placed  Some severe hand pain and swelling soreness over MCP joints and decreased range of motion of hand joint  Objective:  Vital signs in last 24 hours:  Temp:  [98.3 F (36.8 C)-99.9 F (37.7 C)] 99.9 F (37.7 C) (03/24 0240) Pulse Rate:  [79-100] 97 (03/24 0817) Resp:  [18-22] 18 (03/24 0240) BP: (75-118)/(43-69) 118/61 (03/24 0817) SpO2:  [93 %-98 %] 93 % (03/24 0240) Weight:  [268 lb 11.9 oz (121.9 kg)] 268 lb 11.9 oz (121.9 kg) (03/24 0240)  Weight change: 14.1 oz (0.4 kg) Filed Weights   06/04/17 1043 06/05/17 1225 06/06/17 0240  Weight: 277 lb 12.5 oz (126 kg) 278 lb 10.6 oz (126.4 kg) 268 lb 11.9 oz (121.9 kg)    Intake/Output: I/O last 3 completed shifts: In: 600 [P.O.:600] Out: 2759 [Other:2759]   Intake/Output this shift:  No intake/output data recorded.  CVS- RRR RS- CTA ABD- BS present soft non-distended EXT-  1 +  Edema    Synovitis bilateral hands   Basic Metabolic Panel: Recent Labs  Lab 06/01/17 1708 06/03/17 1530 06/04/17 1210 06/05/17 0803  NA 134* 134* 140 134*  K 3.6 3.6 2.9* 3.5  CL 96* 98*  --  98*  CO2 23 23  --  19*  GLUCOSE 111* 269* 67 128*  BUN 128* 110*  --  63*  CREATININE 7.22* 6.37*  --  5.45*  CALCIUM 9.2 8.6*  --  8.3*  PHOS 4.4 4.6  --  4.2    Liver Function Tests: Recent Labs  Lab 06/01/17 1708 06/03/17 1530 06/05/17 0803  ALBUMIN 2.8* 2.5* 2.6*   No results for input(s): LIPASE, AMYLASE in the last 168 hours. No results for input(s): AMMONIA in the last 168 hours.  CBC: Recent Labs  Lab 06/01/17 1708 06/03/17 1047 06/04/17 1210  06/05/17 0803  WBC 23.6* 21.4*  --  22.3*  NEUTROABS  --  18.4*  --   --   HGB 9.4* 9.6* 9.2* 9.4*  HCT 29.2* 30.4* 27.0* 30.2*  MCV 86.4 88.1  --  89.9  PLT 493* 368  --  296    Cardiac Enzymes: No results for input(s): CKTOTAL, CKMB, CKMBINDEX, TROPONINI in the last 168 hours.  BNP: Invalid input(s): POCBNP  CBG: Recent Labs  Lab 06/05/17 1736 06/05/17 1820 06/05/17 2103 06/06/17 0637 06/06/17 1145  GLUCAP 53* 143* 136* 42 173*    Microbiology: Results for orders placed or performed during the hospital encounter of 04/22/17  Respiratory Panel by PCR     Status: None   Collection Time: 04/25/17 10:50 AM  Result Value Ref Range Status   Adenovirus NOT DETECTED NOT DETECTED Final   Coronavirus 229E NOT DETECTED NOT DETECTED Final   Coronavirus HKU1 NOT DETECTED NOT DETECTED Final   Coronavirus NL63 NOT DETECTED NOT DETECTED Final   Coronavirus OC43 NOT DETECTED NOT DETECTED Final   Metapneumovirus NOT DETECTED NOT DETECTED Final   Rhinovirus / Enterovirus NOT DETECTED NOT DETECTED Final   Influenza A NOT DETECTED NOT DETECTED Final   Influenza A H1 NOT DETECTED NOT DETECTED Final   Influenza  A H1 2009 NOT DETECTED NOT DETECTED Final   Influenza A H3 NOT DETECTED NOT DETECTED Final   Influenza B NOT DETECTED NOT DETECTED Final   Parainfluenza Virus 1 NOT DETECTED NOT DETECTED Final   Parainfluenza Virus 2 NOT DETECTED NOT DETECTED Final   Parainfluenza Virus 3 NOT DETECTED NOT DETECTED Final   Parainfluenza Virus 4 NOT DETECTED NOT DETECTED Final   Respiratory Syncytial Virus NOT DETECTED NOT DETECTED Final   Bordetella pertussis NOT DETECTED NOT DETECTED Final   Chlamydophila pneumoniae NOT DETECTED NOT DETECTED Final   Mycoplasma pneumoniae NOT DETECTED NOT DETECTED Final    Comment: Performed at [facility], [place] [address] [place], [place] [address]  Culture, blood (routine x 2)     Status: None   Collection Time: 04/26/17 12:10 AM  Result Value Ref  Range Status   Specimen Description BLOOD LEFT HAND  Final   Special Requests IN PEDIATRIC BOTTLE Blood Culture adequate volume  Final   Culture   Final    NO GROWTH 5 DAYS Performed at [facility], [place] [address] [place], [place] [address]    Report Status 05/01/2017 FINAL  Final  Culture, blood (routine x 2)     Status: None   Collection Time: 04/26/17 12:18 AM  Result Value Ref Range Status   Specimen Description BLOOD LEFT HAND  Final   Special Requests IN PEDIATRIC BOTTLE Blood Culture adequate volume  Final   Culture   Final    NO GROWTH 5 DAYS Performed at [facility], [place] [address] [place], [place] [address]    Report Status 05/01/2017 FINAL  Final  Gastrointestinal Panel by PCR , Stool     Status: None   Collection Time: 05/02/17  2:42 PM  Result Value Ref Range Status   Campylobacter species NOT DETECTED NOT DETECTED Final   Plesimonas shigelloides NOT DETECTED NOT DETECTED Final   Salmonella species NOT DETECTED NOT DETECTED Final   Yersinia enterocolitica NOT DETECTED NOT DETECTED Final   Vibrio species NOT DETECTED NOT DETECTED Final   Vibrio cholerae NOT DETECTED NOT DETECTED Final   Enteroaggregative E coli (EAEC) NOT DETECTED NOT DETECTED Final   Enteropathogenic E coli (EPEC) NOT DETECTED NOT DETECTED Final   Enterotoxigenic E coli (ETEC) NOT DETECTED NOT DETECTED Final   Shiga like toxin producing E coli (STEC) NOT DETECTED NOT DETECTED Final   Shigella/Enteroinvasive E coli (EIEC) NOT DETECTED NOT DETECTED Final   Cryptosporidium NOT DETECTED NOT DETECTED Final   Cyclospora cayetanensis NOT DETECTED NOT DETECTED Final   Entamoeba histolytica NOT DETECTED NOT DETECTED Final   Giardia lamblia NOT DETECTED NOT DETECTED Final   Adenovirus F40/41 NOT DETECTED NOT DETECTED Final   Astrovirus NOT DETECTED NOT DETECTED Final   Norovirus GI/GII NOT DETECTED NOT DETECTED Final   Rotavirus A NOT DETECTED NOT DETECTED Final   Sapovirus (I, II, IV, and  V) NOT DETECTED NOT DETECTED Final    Comment: Performed at [facility], [place] [place], [place] [address]  C difficile quick scan w PCR reflex     Status: None   Collection Time: 05/02/17  2:42 PM  Result Value Ref Range Status   C Diff antigen NEGATIVE NEGATIVE Final   C Diff toxin NEGATIVE NEGATIVE Final   C Diff interpretation No C. difficile detected.  Final    Comment: Performed at [facility], [place] [address] [place], [place] [address]  Surgical pcr  screen     Status: None   Collection Time: 05/04/17  6:53 AM  Result Value Ref Range Status   MRSA, PCR NEGATIVE NEGATIVE Final   Staphylococcus aureus NEGATIVE NEGATIVE Final    Comment: (NOTE) The Xpert SA Assay (FDA approved for NASAL specimens in patients 62 years of age and older), is one component of a comprehensive surveillance program. It is not intended to diagnose infection nor to guide or monitor treatment. Performed at [facility], [place] [address], [place], [place] [address]     Coagulation Studies: No results for input(s): LABPROT, INR in the last 72 hours.  Urinalysis: No results for input(s): COLORURINE, LABSPEC, PHURINE, GLUCOSEU, HGBUR, BILIRUBINUR, KETONESUR, PROTEINUR, UROBILINOGEN, NITRITE, LEUKOCYTESUR in the last 72 hours.  Invalid input(s): APPERANCEUR    Imaging: No results found.   Medications:    . amiodarone  200 mg Oral Daily  . apixaban  5 mg Oral BID  . calcitRIOL  0.5 mcg Oral Q T,Th,Sat-1800  . carvedilol  6.25 mg Oral BID WC  . gabapentin  300 mg Oral Daily  . insulin aspart  3 Units Subcutaneous TID WC  . insulin glargine  30 Units Subcutaneous QHS  . predniSONE  10 mg Oral PC supper  . [START ON 06/07/2017] predniSONE  10 mg Oral 3 x daily with food  . [START ON 06/08/2017] predniSONE  10 mg Oral 4X daily taper  . predniSONE  20 mg Oral Nightly  . 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

## 2017-06-06 NOTE — Progress Notes (Signed)
John Bailey is a 47 y.o. male 21-Oct-1970 [id number]  Subjective: Continued pain in left hand, but also now in right - worse since return from OR for decub debridement yesterday. ?if steroids have been resumed. No other new problems.   Objective: Vital signs in last 24 hours: Temp:  [98.3 F (36.8 C)-99.9 F (37.7 C)] 99.9 F (37.7 C) (03/24 0240) Pulse Rate:  [48-100] 97 (03/24 0817) Resp:  [18-22] 18 (03/24 0240) BP: (75-128)/(43-77) 118/61 (03/24 0817) SpO2:  [93 %-98 %] 93 % (03/24 0240) Weight:  [121.9 kg (268 lb 11.9 oz)-126.4 kg (278 lb 10.6 oz)] 121.9 kg (268 lb 11.9 oz) (03/24 0240) Weight change: 0.4 kg (14.1 oz) Last BM Date: 06/02/17  Intake/Output from previous day: 03/23 0701 - 03/24 0700 In: 600 [P.O.:600] Out: 2759   Physical Exam General: No apparent distress   At sink in [facility] Lungs: Normal effort. Lungs clear to auscultation, no crackles or wheezes. Cardiovascular: Regular rate and rhythm, no edema Musculoskeletal:  Soft tissue edema L>R hands, no abnormal warmth or redness Neurological: No new neurological deficits Wounds: sacral area not examined by me.  Lab Results: BMET    Component Value Date/Time   NA 134 (L) 06/05/2017 0803   K 3.5 06/05/2017 0803   CL 98 (L) 06/05/2017 0803   CO2 19 (L) 06/05/2017 0803   GLUCOSE 128 (H) 06/05/2017 0803   BUN 63 (H) 06/05/2017 0803   CREATININE 5.45 (H) 06/05/2017 0803   CALCIUM 8.3 (L) 06/05/2017 0803   GFRNONAA 12 (L) 06/05/2017 0803   GFRAA 13 (L) 06/05/2017 0803   CBC    Component Value Date/Time   WBC 22.3 (H) 06/05/2017 0803   RBC 3.36 (L) 06/05/2017 0803   HGB 9.4 (L) 06/05/2017 0803   HCT 30.2 (L) 06/05/2017 0803   PLT 296 06/05/2017 0803   MCV 89.9 06/05/2017 0803   MCH 28.0 06/05/2017 0803   MCHC 31.1 06/05/2017 0803   RDW 23.7 (H) 06/05/2017 0803   LYMPHSABS 2.4 06/03/2017 1047   MONOABS 0.6 06/03/2017 1047   EOSABS 0.0 06/03/2017 1047   BASOSABS 0.0 06/03/2017 1047   CBG's (last  3):   Recent Labs    06/05/17 1820 06/05/17 2103 06/06/17 0637  GLUCAP 143* 136* 88   LFT's Lab Results  Component Value Date   ALT 12 (L) 05/22/2017   AST 12 (L) 05/05/2017   ALKPHOS 74 05/05/2017   BILITOT 0.6 05/05/2017    Studies/Results: No results found.  Medications:  I have reviewed the patient's current medications. Scheduled Medications: . amiodarone  200 mg Oral Daily  . apixaban  5 mg Oral BID  . calcitRIOL  0.5 mcg Oral Q T,Th,Sat-1800  . carvedilol  6.25 mg Oral BID WC  . gabapentin  300 mg Oral Daily  . insulin aspart  3 Units Subcutaneous TID WC  . insulin glargine  30 Units Subcutaneous QHS  . predniSONE  10 mg Oral PC lunch  . predniSONE  10 mg Oral PC supper  . [START ON 06/07/2017] predniSONE  10 mg Oral 3 x daily with food  . [START ON 06/08/2017] predniSONE  10 mg Oral 4X daily taper  . predniSONE  20 mg Oral AC breakfast  . predniSONE  20 mg Oral Nightly  . [START ON 06/07/2017] predniSONE  20 mg Oral Nightly   PRN Medications: acetaminophen, methocarbamol, sorbitol  Assessment/Plan: Principal Problem:   Debility Active Problems:   Cardiorenal syndrome with renal failure   Pain  PAF (paroxysmal atrial fibrillation) (HCC)   Acute on chronic diastolic (congestive) heart failure (HCC)   Type 2 diabetes mellitus with peripheral neuropathy (HCC)   Acute blood loss anemia   Anemia of chronic disease   ESRD on dialysis ([place])   Carpal tunnel syndrome of left wrist   Labile blood pressure   Labile blood glucose   HSV (herpes simplex virus) infection   Neuropathic pain   Constipation   Steroid-induced hyperglycemia   Noncompliance   Pressure injury of sacral region, unstageable ([place])   1. Debility, multi factorial - continue CIR as ongoing 2. L hand pain - likely RA flare due to steroid-free state for last 48h - also recognize potential component of neuropathic pain - on Neurontin, Voltaren gel, capsaicin cream and Lidoderm. will resume  steroid taper now with plans for daily home pred 10mg  qd -  3. Sacral decub - s/p OR debridement 3/22 by gen surg - dressing change W>D as ordered BID 4. ESRD - HD TTS - per renal 5. DM2, hyperglycemia exac by steroids and stress - continue lantus and SSI with adjustments as needed  Length of stay, days: 24   Valerie A. Asa Lente, MD 06/06/2017, 11:18 AM

## 2017-06-07 ENCOUNTER — Inpatient Hospital Stay (HOSPITAL_COMMUNITY): Payer: BLUE CROSS/BLUE SHIELD | Admitting: Occupational Therapy

## 2017-06-07 ENCOUNTER — Inpatient Hospital Stay (HOSPITAL_COMMUNITY): Payer: BLUE CROSS/BLUE SHIELD

## 2017-06-07 LAB — GLUCOSE, CAPILLARY
GLUCOSE-CAPILLARY: 307 mg/dL — AB (ref 65–99)
Glucose-Capillary: 360 mg/dL — ABNORMAL HIGH (ref 65–99)
Glucose-Capillary: 272 mg/dL — ABNORMAL HIGH (ref 65–99)
Glucose-Capillary: 334 mg/dL — ABNORMAL HIGH (ref 65–99)

## 2017-06-07 NOTE — Progress Notes (Addendum)
Physical Therapy Note  Patient Details  Name: John Bailey MRN: [id number] Date of Birth: 1970-08-11 Today's Date: 06/07/2017  4451-4604 50 min individual tx Pain: buttocks, did not rate; declined meds PT donned L compression glove; pt already wearing R ankle brace  W/c propulsion using RUE and bil LEs on level tile with distant supervision.  Pt needed assistance to manage w/c brakes.  Seated Therapeutic exercise performed with LEs to increase strength for functional mobility: 10 x 1 R/L marching, heel raises, toe raises, long arc quad knee ext.  Self stretching in sitting with R/L LE propped up on 10" high stool, for hamstring/heel cord stetching.  Gait training with PFRW x 200' with multiple turns, supervision.  Standing therapeutic activity x 6 minutes using L hand to manipulate pegs into pegboard using a picture design, with extra time and assistance from R hand at times.  Stiff L fingers got in the way of placing pegs after grasping them; pt needed cues to problem solve how to move board to improve function with L hand. Pt left resting in w/c with all needs within reach; PT doffed shoes, R ankle brace.  See function navigator for current status.  Shankar Silber 06/07/2017, 7:55 AM

## 2017-06-07 NOTE — Progress Notes (Signed)
Social Work Patient ID: John Bailey, male   DOB: May 07, 1970, 47 y.o.   MRN: [id number]  According to MD pt will be medically ready for discharge tomorrow after HD. Have contacted HD to place him on first run. He is aware of the plan. Have made referral to [facility] for hospital bed with air mattress overlay due to sacral wound. Work toward discharge tomorrow.

## 2017-06-07 NOTE — Progress Notes (Signed)
Physical Therapy Session Note  Patient Details  Name: John Bailey MRN: [id number] Date of Birth: [birth year]  Today's Date: 06/07/2017 PT Individual Time: 3559-7416 PT Individual Time Calculation (min): 72 min   Short Term Goals: Week 3:  PT Short Term Goal 1 (Week 3): STG=LTG  Skilled Therapeutic Interventions/Progress Updates:    Pt supine in bed upon PT arrival, agreeable to therapy tx and denies pain. Pt transferred from supine>sitting EOB with supervision and use of bed rails. Pt donned shoes using reacher for positioning. Pt ambulated from room>gym x 200 ft using L platform RW and with supervision, increased time and verbal cues for techniques/upright posture. Therapist adjusted w/c to increase seat<>floor height to allow for increased independence performing sit<>stands for the pt. Therapist also added theraband around wheels to increase ability to grip wheel for w/c propulsion. Pt propelled w/c throughout unit with supervision using B LEs and R UE. Pt ambulated on uneven surfaces outside x 80 ft with supervision and verbal cues for RW management. Pt performed stand pivot car transfer with min assist and verbal cues for techniques. Pt left seated in w/c at end of session with needs in reach.   Therapy Documentation Precautions:  Precautions Precautions: Fall Precaution Comments: Severe RA in shoulders, left hand Required Braces or Orthoses: Other Brace/Splint Other Brace/Splint: R ankle brace Restrictions Weight Bearing Restrictions: No   See Function Navigator for Current Functional Status.   Therapy/Group: Individual Therapy  Netta Corrigan, PT, DPT 06/07/2017, 7:58 AM

## 2017-06-07 NOTE — Progress Notes (Signed)
Surgeon in, dressing change to sacrum area observed by MD will f/u with team members for additional treatment.Patient tolerated dressing change well , NS/Kerlix packing to wound with ABD dressing change BID,Continue to monitor closely.

## 2017-06-07 NOTE — Progress Notes (Signed)
Physical Therapy Wound ReEvaluation Treatment Patient Details  Name: John Bailey MRN: [id number] Date of Birth: [birth year]  Treatment Time 11:04-12:02 Duration: 58 minutes  Subjective  Subjective: Pt is glad to be going home but is worried about his wound healing Patient and Family Stated Goals: heal wound decrease odor Prior Treatments: Surgical I&D 06/04/17  Pain Score: Pt premedicated prior to treatment. Pt with 6/10 Facial Pain score during treatment. No complaints of pain after treatment.  Wound Assessment  Pressure Injury 05/28/17 Unstageable - Full thickness tissue loss in which the base of the ulcer is covered by slough (yellow, tan, gray, green or brown) and/or eschar (tan, brown or black) in the wound bed. Central gluteal fold (Active)  Dressing Type ABD;Barrier Film (skin prep);Gauze (Comment);Moist to dry;Other (Comment) 06/07/2017  1:00 PM  Dressing Changed 06/07/2017  1:00 PM  Dressing Change Frequency Daily 06/07/2017  1:00 PM  State of Healing Non-healing 06/07/2017  1:00 PM  Site / Wound Assessment Yellow;Brown 06/07/2017  1:00 PM  % Wound base Red or Granulating 80% 06/07/2017  1:00 PM  % Wound base Yellow/Fibrinous Exudate 20% 06/07/2017  1:00 PM  Peri-wound Assessment Erythema (blanchable);Maceration;Pink;Denuded 06/07/2017  1:00 PM  Wound Length (cm) 11 cm 06/07/2017  1:00 PM  Wound Width (cm) 5.5 cm 06/07/2017  1:00 PM  Wound Depth (cm) 5 cm 06/07/2017  1:00 PM  Wound Surface Area (cm^2) 60.5 cm^2 06/07/2017  1:00 PM  Wound Volume (cm^3) 302.5 cm^3 06/07/2017  1:00 PM  Tunneling (cm) 6 cm at 12 o'clock 06/02/2017 11:00 AM  Margins Unattached edges (unapproximated) 06/07/2017  1:00 PM  Drainage Amount Minimal 06/07/2017  1:00 PM  Drainage Description Odor;Serous 06/07/2017  1:00 PM  Treatment Cleansed;Debridement (Selective);Packing (Saline gauze) 06/07/2017  1:00 PM   Santyl applied to base of wound   Wound / Incision (Open or Dehisced) 05/28/17 Non-pressure wound Sacrum  Right;Left;Medial R buttock (Active)  Dressing Type ABD;Barrier Film (skin prep);Gauze (Comment);Moist to dry;Other (Comment) 06/07/2017  1:00 PM  Dressing Changed Changed 06/03/2017  1:00 PM  Dressing Status Clean;Dry;Intact 06/07/2017  1:00 PM  Dressing Change Frequency Daily 06/07/2017  1:00 PM  Site / Wound Assessment Yellow;Painful 06/07/2017  1:00 PM  % Wound base Yellow/Fibrinous Exudate 100% 06/07/2017  1:00 PM  Peri-wound Assessment Denuded;Pink 06/07/2017  1:00 PM  Wound Length (cm) 3.5 cm 06/02/2017 11:00 AM  Wound Width (cm) 2.5 cm 06/02/2017 11:00 AM  Wound Surface Area (cm^2) 8.75 cm^2 06/02/2017 11:00 AM  Margins Unattached edges (unapproximated) 06/07/2017  1:00 PM  Closure None 06/07/2017  1:00 PM  Drainage Amount Moderate 06/07/2017  1:00 PM  Drainage Description Purulent;Odor 06/07/2017  1:00 PM  Treatment Cleansed;Debridement (Selective);Hydrotherapy (Pulse lavage);Packing (Saline gauze) 06/03/2017  1:00 PM     Hydrotherapy Pulsed lavage therapy - wound location: sacrum Pulsed Lavage with Suction (psi): 4 psi(4-12) Pulsed Lavage with Suction - Normal Saline Used: 1000 mL Pulsed Lavage Tip: Tip with splash shield Selective Debridement Selective Debridement - Location: sacrum  Selective Debridement - Tools Used: Forceps;Scissors Selective Debridement - Tissue Removed: brown, gray yellow, necrotic tissue.    Wound Assessment and Plan  Wound Therapy - Assess/Plan/Recommendations Wound Therapy - Clinical Statement: Pt wound is now debrided to sacrum and has a small amount of granulation tissue however there is a gray brown, malodorous film over the majority of the slough material. Able to physically debride a small amount of necrotic tissue. Will contact surgical PA regarding [place] wound care protocol. Factors Delaying/Impairing Wound Healing: Immobility;Multiple medical  problems;Diabetes Mellitus Hydrotherapy Plan: Debridement;Patient/family education;Pulsatile lavage with suction Wound  Therapy - Frequency: 6X / week Wound Therapy - Current Recommendations: Surgery consult;WOC nurse Wound Therapy - Follow Up Recommendations: Other (comment) Wound Plan: see above  Wound Therapy Goals- Improve the function of patient's integumentary system by progressing the wound(s) through the phases of wound healing (inflammation - proliferation - remodeling) by: Decrease Necrotic Tissue to: 50 Decrease Necrotic Tissue - Progress: Progressing toward goal Increase Granulation Tissue to: 50 Increase Granulation Tissue - Progress: Progressing toward goal Goals/treatment plan/discharge plan were made with and agreed upon by patient/family: Yes Time For Goal Achievement: 7 days Wound Therapy - Potential for Goals: Fair  Goals will be updated until maximal potential achieved or discharge criteria met.  Discharge criteria: when goals achieved, discharge from hospital, MD decision/surgical intervention, no progress towards goals, refusal/missing three consecutive treatments without notification or medical reason.  GP    Dani Gobble. [name] PT, DPT Acute Rehabilitation  [phone] Pager [phone]   [place] 06/07/2017, 2:03 PM

## 2017-06-07 NOTE — Progress Notes (Signed)
[facility] Progress Note  3 Days Post-Op  Subjective: CC-  No complaints this morning. Currently up with therapy. Tolerating dressing changes well. Per rehab MD patient nearly ready for discharge.  Objective: Vital signs in last 24 hours: Temp:  [98.3 F (36.8 C)-99.1 F (37.3 C)] 98.3 F (36.8 C) (03/25 0435) Pulse Rate:  [89-96] 90 (03/25 0435) Resp:  [14-18] 18 (03/25 0435) BP: (96-119)/(51-76) 117/74 (03/25 0435) SpO2:  [97 %-100 %] 97 % (03/25 0435) Weight:  [277 lb 5.4 oz (125.8 kg)] 277 lb 5.4 oz (125.8 kg) (03/25 0435) Last BM Date: 06/02/17  Intake/Output from previous day: 03/24 0701 - 03/25 0700 In: 360 [P.O.:360] Out: -  Intake/Output this shift: No intake/output data recorded.  PE: Gen:  Alert, NAD, pleasant HEENT: EOM's intact, pupils equal and round Pulm:  effort normal Psych: A&Ox3  GU: dressing to sacral wound  Lab Results:  Recent Labs    06/04/17 1210 06/05/17 0803  WBC  --  22.3*  HGB 9.2* 9.4*  HCT 27.0* 30.2*  PLT  --  296   BMET Recent Labs    06/04/17 1210 06/05/17 0803  NA 140 134*  K 2.9* 3.5  CL  --  98*  CO2  --  19*  GLUCOSE 67 128*  BUN  --  63*  CREATININE  --  5.45*  CALCIUM  --  8.3*   PT/INR No results for input(s): LABPROT, INR in the last 72 hours. CMP     Component Value Date/Time   NA 134 (L) 06/05/2017 0803   K 3.5 06/05/2017 0803   CL 98 (L) 06/05/2017 0803   CO2 19 (L) 06/05/2017 0803   GLUCOSE 128 (H) 06/05/2017 0803   BUN 63 (H) 06/05/2017 0803   CREATININE 5.45 (H) 06/05/2017 0803   CALCIUM 8.3 (L) 06/05/2017 0803   PROT 5.9 (L) 05/05/2017 0430   ALBUMIN 2.6 (L) 06/05/2017 0803   AST 12 (L) 05/05/2017 0430   ALT 12 (L) 05/22/2017 1822   ALKPHOS 74 05/05/2017 0430   BILITOT 0.6 05/05/2017 0430   GFRNONAA 12 (L) 06/05/2017 0803   GFRAA 13 (L) 06/05/2017 0803   Lipase  No results found for: LIPASE     Studies/Results: No results found.  Anti-infectives: Anti-infectives  (From admission, onward)   Start     Dose/Rate Route Frequency Ordered Stop   06/04/17 0800  ceFAZolin (ANCEF) IVPB 2g/100 mL premix     2 g 200 mL/hr over 30 Minutes Intravenous  Once 06/03/17 0753 06/04/17 1240   05/19/17 1000  acyclovir (ZOVIRAX) 200 MG capsule 200 mg     200 mg Oral 2 times daily 05/19/17 6644 05/25/17 2236       Assessment/Plan Sacral ulcer S/p DEBRIDMENT OF SACRAL DECUBITUS ULCER [id number] 3/22 Dr. Grand[place] Silos - POD 3 - BID wet to dry dressing changes  ID - ancef perioperative FEN - renal diet VTE - eliquis Foley - none  Plan - Per rehab patient will likely be ready for discharge today vs tomorrow. Will discuss with MD if hydrotherapy is necessary prior to discharge or if patient could follow up with wound clinic as outpatient and continue BID wet to dry dressing changes at home.   LOS: 25 days    Wellington Hampshire , [facility] Surgery 06/07/2017, 8:18 AM Pager: [phone] Consults: [phone] Mon-Fri 7:00 am-4:30 pm Sat-Sun 7:00 am-11:30 am

## 2017-06-07 NOTE — Progress Notes (Signed)
[facility] PHYSICAL MEDICINE & REHABILITATION     PROGRESS NOTE  Subjective/Complaints:  Patient seen lying in bed this morning. He states he slept well overnight. He states he had a good weekend. He has questions about discharge and plan for his wound. He had surgical debridement on Friday, discussed with therapy PA.  ROS: Denies CP, SOB, N/V/D  Objective: Vital Signs: Blood pressure 140/81, pulse 93, temperature 98.3 F (36.8 C), temperature source Oral, resp. rate 18, height 6' 0.01" (1.829 m), weight 125.8 kg (277 lb 5.4 oz), SpO2 97 %. No results found. Recent Labs    06/04/17 1210 06/05/17 0803  WBC  --  22.3*  HGB 9.2* 9.4*  HCT 27.0* 30.2*  PLT  --  296   Recent Labs    06/04/17 1210 06/05/17 0803  NA 140 134*  K 2.9* 3.5  CL  --  98*  GLUCOSE 67 128*  BUN  --  63*  CREATININE  --  5.45*  CALCIUM  --  8.3*   CBG (last 3)  Recent Labs    06/06/17 1656 06/06/17 2138 06/07/17 0633  GLUCAP 246* 362* 360*    Wt Readings from Last 3 Encounters:  06/07/17 125.8 kg (277 lb 5.4 oz)  05/13/17 132.1 kg (291 lb 3.6 oz)  04/02/17 (!) 154.9 kg (341 lb 9.6 oz)    Physical Exam:  BP 140/81   Pulse 93   Temp 98.3 F (36.8 C) (Oral)   Resp 18   Ht 6' 0.01" (1.829 m)   Wt 125.8 kg (277 lb 5.4 oz)   SpO2 97%   BMI 37.61 kg/m  Constitutional: No distress . Vital signs reviewed. HENT: Normocephalic, atraumatic Eyes: EOMI, oral membranes moist Cardiovascular: RRR. No JVD    Respiratory: CTA Bilaterally. Normal effort    GI: BS +, non-distended  Musc:  Bouchard's deformities  +Edema b/l LE Neurological: Alert and oriented.  Motor: Right upper extremity 4/5 proximal to distal  Left upper extremity: Shoulder abduction, elbow flexion/extension 4-/5, hand grip 2-/5 (stable) Bilateral lower extremities: 4-/5 proximal to distal (slowly improving)  Skin.  Lower extremity ischemic changes.   Assessment/Plan: 1. Functional deficits secondary to debility which  require 3+ hours per day of interdisciplinary therapy in a comprehensive inpatient rehab setting. Physiatrist is providing close team supervision and 24 hour management of active medical problems listed below. Physiatrist and rehab team continue to assess barriers to discharge/monitor patient progress toward functional and medical goals.  Function:  Bathing Bathing position   Position: Wheelchair/chair at sink  Bathing parts Body parts bathed by patient: Right arm, Left arm, Chest, Abdomen, Front perineal area, Buttocks, Right upper leg, Left upper leg Body parts bathed by helper: Back, Left lower leg, Right lower leg  Bathing assist Assist Level: Touching or steadying assistance(Pt > 75%)      Upper Body Dressing/Undressing Upper body dressing   What is the patient wearing?: Pull over shirt/dress     Pull over shirt/dress - Perfomed by patient: Thread/unthread right sleeve, Thread/unthread left sleeve, Put head through opening, Pull shirt over trunk Pull over shirt/dress - Perfomed by helper: Pull shirt over trunk        Upper body assist Assist Level: Supervision or verbal cues      Lower Body Dressing/Undressing Lower body dressing Lower body dressing/undressing activity did not occur: N/A What is the patient wearing?: Shoes, Underwear, Pants Underwear - Performed by patient: Thread/unthread right underwear leg, Thread/unthread left underwear leg, Pull underwear up/down Underwear -  Performed by helper: Pull underwear up/down Pants- Performed by patient: Thread/unthread right pants leg, Thread/unthread left pants leg, Pull pants up/down Pants- Performed by helper: Pull pants up/down         Shoes - Performed by patient: Don/doff right shoe, Don/doff left shoe Shoes - Performed by helper: Don/doff right shoe, Don/doff left shoe AFO - Performed by patient: Don/doff right AFO AFO - Performed by helper: Don/doff right AFO      Lower body assist Assist for lower body  dressing: Touching or steadying assistance (Pt > 75%)      Toileting Toileting Toileting activity did not occur: No continent bowel/bladder event Toileting steps completed by patient: Adjust clothing prior to toileting, Adjust clothing after toileting, Performs perineal hygiene Toileting steps completed by helper: Adjust clothing after toileting, Adjust clothing prior to toileting    Toileting assist Assist level: Touching or steadying assistance (Pt.75%)   Transfers Chair/bed transfer Chair/bed transfer activity did not occur: Refused Chair/bed transfer method: Stand pivot Chair/bed transfer assist level: Touching or steadying assistance (Pt > 75%) Chair/bed transfer assistive device: Armrests, Walker Mechanical lift: Ecologist Ambulation activity did not occur: Refused   Max distance: 150' Assist level: Supervision or verbal cues   Wheelchair Wheelchair activity did not occur: Refused Type: Manual Max wheelchair distance: 100' Assist Level: Supervision or verbal cues  Cognition Comprehension Comprehension assist level: Follows complex conversation/direction with no assist  Expression Expression assist level: Expresses complex ideas: With no assist  Social Interaction Social Interaction assist level: Interacts appropriately 90% of the time - Needs monitoring or encouragement for participation or interaction.  Problem Solving Problem solving assist level: Solves complex problems: With extra time  Memory Memory assist level: Recognizes or recalls 90% of the time/requires cueing < 10% of the time    Medical Problem List and Plan: 1.Debilitysecondary to cardiorenal syndrome, synovitis left hand due to RA   Cont CIR, planned for discharge today or tomorrow pending discussion with surgery attending   Weekend notes reviewed, discussed with team 2. DVT Prophylaxis/Anticoagulation: Eliquis. Monitor for any bleeding episodes 3. Pain Management:Neurontin 300 mg  daily, Robaxin as needed   Voltaren gel added for joint pain.   Neuropathic pain Left hand - cont gabapentin, started on capsasin cream .025%   Lidoderm patch added on 3/5   Controlled on 3/25 4. Mood:Provide emotional support 5. Neuropsych: This patientiscapable of making decisions on hisown behalf. 6. Skin/Wound Care:Routine skin checks   Sacral decubitus ulcer surgically debrided in OR on 3/22, discussed with surgery PA this morning, plans to begin hydrotherapy. Discussed potential discharge and outpatient follow-up wound center. 7. Fluids/Electrolytes/Nutrition:Routine I&O's 8.End-stage renal disease. Hemodialysis initiated Tuesday Thursday Saturday schedule. Patient with left AVFgraft placed 05/10/2017   Appreciate nephro recs. 9.Acute on chronic anemia. Continue Aranesp.    Hb 9.4 on 3/23   Cont to monitor 10.Steroid induced hyperglycemia on Diabetes mellitus with peripheral neuropathy. Latest hemoglobin A1c 9.2. Diabetic teaching   Lantus insulin 25 units daily at bedtime, increased to 30 on 3/8, increased to 35 on 3/14, decreased to 30 given steroid taper and labile readings to avoid hypoglycemia.  Steroids being tapered   NovoLog 3 units 3 times a day, increased to 6 on 3/4, increased to 10 on 3/14.    CBG (last 3)  Recent Labs    06/06/17 1656 06/06/17 2138 06/07/17 0633  GLUCAP 246* 362* 360*    Extremely labile on 3/25, multifactorial due to steroids and noncompliance 11.Acute on chronic  diastolic congestive heart failure. Monitor for any signs of fluid overload. Continue Coreg 6.25mg  BID  Filed Weights   06/05/17 1225 06/06/17 0240 06/07/17 0435  Weight: 126.4 kg (278 lb 10.6 oz) 121.9 kg (268 lb 11.9 oz) 125.8 kg (277 lb 5.4 oz)     Volume management per nephrology.    12.Rheumatoid arthritis. Prednisone 10 mg daily. Patient had been receiving Abataceptevery Tuesday prior to admission. Added Voltaren gel. Follow-up rheumatologist Dr.Ziokowska   MRI right  knee reviewed, showing OA, meniscal tear and patellar tendinosis on right.  Per Ortho, braces PRN   DMARD resumed on 3/5    Started on high-dose 6 week steroid taper on 3/6  13.Atrial fibrillation RVR. Status post cardioversion. Amiodarone decreased to 200 mg daily 3/3 14.Leukocytosis.     Afebrile   WBCs 22.3 on 3/23, likely steroid related   Hand films reviewed, suggesting RA changes   Continue to monitor   Cont to monitor 15.  Left hand numbness   Likely related to fistula placement +/- RA 16. Labile blood pressure   Vitals:   06/07/17 0435 06/07/17 0855  BP: 117/74 140/81  Pulse: 90 93  Resp: 18   Temp: 98.3 F (36.8 C)   SpO2: 97%     Partly related to hemodialysis 17. 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 Constipation with gas   Bowel regimen increased on 3/7   Simethicone started on 3/14  LOS (Days) 25 A FACE TO FACE EVALUATION WAS PERFORMED  Ankit Lorie Phenix 06/07/2017 9:47 AM

## 2017-06-07 NOTE — Progress Notes (Signed)
[facility] ROUNDING NOTE   Subjective:    New ESRD to be placed at [facility]  TTS shift  He continues in therapy  Debility secondary to cardiorenal syndrome and is scheduled for debridement of sacral wound. Has history of rheumatoid arthritis and continues on Prednisone 10 mg daily  He also has atrial fibrillation and is taking amiodarone  He has a fistula placed  Exacerbation of RA improved  Patient for discharge in AM   Objective:  Vital signs in last 24 hours:  Temp:  [98.3 F (36.8 C)-99.1 F (37.3 C)] 98.3 F (36.8 C) (03/25 0435) Pulse Rate:  [89-96] 93 (03/25 0855) Resp:  [14-18] 18 (03/25 0435) BP: (96-140)/(51-81) 140/81 (03/25 0855) SpO2:  [97 %-100 %] 97 % (03/25 0435) Weight:  [277 lb 5.4 oz (125.8 kg)] 277 lb 5.4 oz (125.8 kg) (03/25 0435)  Weight change: -1 lb 5.2 oz (-0.6 kg) Filed Weights   06/05/17 1225 06/06/17 0240 06/07/17 0435  Weight: 278 lb 10.6 oz (126.4 kg) 268 lb 11.9 oz (121.9 kg) 277 lb 5.4 oz (125.8 kg)    Intake/Output: I/O last 3 completed shifts: In: 600 [P.O.:600] Out: -    Intake/Output this shift:  No intake/output data recorded.  CVS- RRR RS- CTA ABD- BS present soft non-distended EXT-  1 +  Edema    Synovitis bilateral hands   Basic Metabolic Panel: Recent Labs  Lab 06/01/17 1708 06/03/17 1530 06/04/17 1210 06/05/17 0803  NA 134* 134* 140 134*  K 3.6 3.6 2.9* 3.5  CL 96* 98*  --  98*  CO2 23 23  --  19*  GLUCOSE 111* 269* 67 128*  BUN 128* 110*  --  63*  CREATININE 7.22* 6.37*  --  5.45*  CALCIUM 9.2 8.6*  --  8.3*  PHOS 4.4 4.6  --  4.2    Liver Function Tests: Recent Labs  Lab 06/01/17 1708 06/03/17 1530 06/05/17 0803  ALBUMIN 2.8* 2.5* 2.6*   No results for input(s): LIPASE, AMYLASE in the last 168 hours. No results for input(s): AMMONIA in the last 168 hours.  CBC: Recent Labs  Lab 06/01/17 1708 06/03/17 1047 06/04/17 1210 06/05/17 0803  WBC 23.6* 21.4*  --  22.3*  NEUTROABS  --   18.4*  --   --   HGB 9.4* 9.6* 9.2* 9.4*  HCT 29.2* 30.4* 27.0* 30.2*  MCV 86.4 88.1  --  89.9  PLT 493* 368  --  296    Cardiac Enzymes: No results for input(s): CKTOTAL, CKMB, CKMBINDEX, TROPONINI in the last 168 hours.  BNP: Invalid input(s): POCBNP  CBG: Recent Labs  Lab 06/06/17 0637 06/06/17 1145 06/06/17 1656 06/06/17 2138 06/07/17 0633  GLUCAP 88 173* 246* 362* 360*    Microbiology: Results for orders placed or performed during the hospital encounter of 04/22/17  Respiratory Panel by PCR     Status: None   Collection Time: 04/25/17 10:50 AM  Result Value Ref Range Status   Adenovirus NOT DETECTED NOT DETECTED Final   Coronavirus 229E NOT DETECTED NOT DETECTED Final   Coronavirus HKU1 NOT DETECTED NOT DETECTED Final   Coronavirus NL63 NOT DETECTED NOT DETECTED Final   Coronavirus OC43 NOT DETECTED NOT DETECTED Final   Metapneumovirus NOT DETECTED NOT DETECTED Final   Rhinovirus / Enterovirus NOT DETECTED NOT DETECTED Final   Influenza A NOT DETECTED NOT DETECTED Final   Influenza A H1 NOT DETECTED NOT DETECTED Final   Influenza A H1 2009 NOT DETECTED  NOT DETECTED Final   Influenza A H3 NOT DETECTED NOT DETECTED Final   Influenza B NOT DETECTED NOT DETECTED Final   Parainfluenza Virus 1 NOT DETECTED NOT DETECTED Final   Parainfluenza Virus 2 NOT DETECTED NOT DETECTED Final   Parainfluenza Virus 3 NOT DETECTED NOT DETECTED Final   Parainfluenza Virus 4 NOT DETECTED NOT DETECTED Final   Respiratory Syncytial Virus NOT DETECTED NOT DETECTED Final   Bordetella pertussis NOT DETECTED NOT DETECTED Final   Chlamydophila pneumoniae NOT DETECTED NOT DETECTED Final   Mycoplasma pneumoniae NOT DETECTED NOT DETECTED Final    Comment: Performed at [facility], [place] [address], [place], [place] [address]  Culture, blood (routine x 2)     Status: None   Collection Time: 04/26/17 12:10 AM  Result Value Ref Range Status   Specimen Description BLOOD LEFT HAND  Final    Special Requests IN PEDIATRIC BOTTLE Blood Culture adequate volume  Final   Culture   Final    NO GROWTH 5 DAYS Performed at [facility], [place] [address], [place], [place] [address]    Report Status 05/01/2017 FINAL  Final  Culture, blood (routine x 2)     Status: None   Collection Time: 04/26/17 12:18 AM  Result Value Ref Range Status   Specimen Description BLOOD LEFT HAND  Final   Special Requests IN PEDIATRIC BOTTLE Blood Culture adequate volume  Final   Culture   Final    NO GROWTH 5 DAYS Performed at [facility], [place] [address] [place], [place] [address]    Report Status 05/01/2017 FINAL  Final  Gastrointestinal Panel by PCR , Stool     Status: None   Collection Time: 05/02/17  2:42 PM  Result Value Ref Range Status   Campylobacter species NOT DETECTED NOT DETECTED Final   Plesimonas shigelloides NOT DETECTED NOT DETECTED Final   Salmonella species NOT DETECTED NOT DETECTED Final   Yersinia enterocolitica NOT DETECTED NOT DETECTED Final   Vibrio species NOT DETECTED NOT DETECTED Final   Vibrio cholerae NOT DETECTED NOT DETECTED Final   Enteroaggregative E coli (EAEC) NOT DETECTED NOT DETECTED Final   Enteropathogenic E coli (EPEC) NOT DETECTED NOT DETECTED Final   Enterotoxigenic E coli (ETEC) NOT DETECTED NOT DETECTED Final   Shiga like toxin producing E coli (STEC) NOT DETECTED NOT DETECTED Final   Shigella/Enteroinvasive E coli (EIEC) NOT DETECTED NOT DETECTED Final   Cryptosporidium NOT DETECTED NOT DETECTED Final   Cyclospora cayetanensis NOT DETECTED NOT DETECTED Final   Entamoeba histolytica NOT DETECTED NOT DETECTED Final   Giardia lamblia NOT DETECTED NOT DETECTED Final   Adenovirus F40/41 NOT DETECTED NOT DETECTED Final   Astrovirus NOT DETECTED NOT DETECTED Final   Norovirus GI/GII NOT DETECTED NOT DETECTED Final   Rotavirus A NOT DETECTED NOT DETECTED Final   Sapovirus (I, II, IV, and V) NOT DETECTED NOT DETECTED Final    Comment: Performed at  [facility], [place] [place],  [address]  C difficile quick scan w PCR reflex     Status: None   Collection Time: 05/02/17  2:42 PM  Result Value Ref Range Status   C Diff antigen NEGATIVE NEGATIVE Final   C Diff toxin NEGATIVE NEGATIVE Final   C Diff interpretation No C. difficile detected.  Final    Comment: Performed at [facility], [place] [address], [place],  [address]  Surgical pcr screen  Status: None   Collection Time: 05/04/17  6:53 AM  Result Value Ref Range Status   MRSA, PCR NEGATIVE NEGATIVE Final   Staphylococcus aureus NEGATIVE NEGATIVE Final    Comment: (NOTE) The Xpert SA Assay (FDA approved for NASAL specimens in patients 40 years of age and older), is one component of a comprehensive surveillance program. It is not intended to diagnose infection nor to guide or monitor treatment. Performed at [facility], [place] [address] [place], [place] [address]     Coagulation Studies: No results for input(s): LABPROT, INR in the last 72 hours.  Urinalysis: No results for input(s): COLORURINE, LABSPEC, PHURINE, GLUCOSEU, HGBUR, BILIRUBINUR, KETONESUR, PROTEINUR, UROBILINOGEN, NITRITE, LEUKOCYTESUR in the last 72 hours.  Invalid input(s): APPERANCEUR    Imaging: No results found.   Medications:    . amiodarone  200 mg Oral Daily  . apixaban  5 mg Oral BID  . calcitRIOL  0.5 mcg Oral Q T,Th,Sat-1800  . carvedilol  6.25 mg Oral BID WC  . gabapentin  300 mg Oral Daily  . insulin aspart  3 Units Subcutaneous TID WC  . insulin glargine  30 Units Subcutaneous QHS  . predniSONE  10 mg Oral 3 x daily with food  . [START ON 06/08/2017] predniSONE  10 mg Oral 4X daily taper  . 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

## 2017-06-07 NOTE — Discharge Instructions (Signed)
Inpatient Rehab Discharge Instructions  John Bailey Discharge date and time: No discharge date for patient encounter.   Activities/Precautions/ Functional Status: Activity: activity as tolerated Diet: renal diet Wound Care: keep wound clean and dry Functional status:  ___ No restrictions     ___ Walk up steps independently ___ 24/7 supervision/assistance   ___ Walk up steps with assistance ___ Intermittent supervision/assistance  ___ Bathe/dress independently ___ Walk with walker     _x__ Bathe/dress with assistance ___ Walk Independently    ___ Shower independently ___ Walk with assistance    ___ Shower with assistance ___ No alcohol     ___ Return to work/school ________  Special Instructions:  Wet to dry dressing to sacral/buttock wound twice daily   COMMUNITY REFERRALS UPON DISCHARGE:    Home Health:   PT, OT, RN, [place]   Date of last service:  Medical Equipment/Items Ordered:WHEELCHAIR, BEDSIDE COMMODE, LEFT PLATFORM ROLLING WALKER , [name]  Agency/Supplier:[facility]   [phone]    My questions have been answered and I understand these instructions. I will adhere to these goals and the provided educational materials after my discharge from the hospital.  Patient/Caregiver Signature _______________________________ Date __________  Clinician Signature _______________________________________ Date __________  Please bring this form and your medication list with you to all your follow-up doctor's appointments. Information on my medicine - ELIQUIS (apixaban)  This medication education was reviewed with me or my healthcare representative as part of my discharge preparation.  The pharmacist that spoke with me during my hospital stay was:  Charlton Haws, [facility]  Why was Eliquis prescribed for you? Eliquis was prescribed for you to reduce the risk of a blood clot  forming that can cause a stroke if you have a medical condition called atrial fibrillation (a type of irregular heartbeat).  What do You need to know about Eliquis ? Take your Eliquis TWICE DAILY - one tablet in the morning and one tablet in the evening with or without food. If you have difficulty swallowing the tablet whole please discuss with your pharmacist how to take the medication safely.  Take Eliquis exactly as prescribed by your doctor and DO NOT stop taking Eliquis without talking to the doctor who prescribed the medication.  Stopping may increase your risk of developing a stroke.  Refill your prescription before you run out.  After discharge, you should have regular check-up appointments with your healthcare provider that is prescribing your Eliquis.  In the future your dose may need to be changed if your kidney function or weight changes by a significant amount or as you get older.  What do you do if you miss a dose? If you miss a dose, take it as soon as you remember on the same day and resume taking twice daily.  Do not take more than one dose of ELIQUIS at the same time to make up a missed dose.  Important Safety Information A possible side effect of Eliquis is bleeding. You should call your healthcare provider right away if you experience any of the following: ? Bleeding from an injury or your nose that does not stop. ? Unusual colored urine (red or dark brown) or unusual colored stools (red or black). ? Unusual bruising for unknown reasons. ? A serious fall or if you hit your head (even if there is no bleeding).  Some medicines may interact with Eliquis and might increase your  risk of bleeding or clotting while on Eliquis. To help avoid this, consult your healthcare provider or pharmacist prior to using any new prescription or non-prescription medications, including herbals, vitamins, non-steroidal anti-inflammatory drugs (NSAIDs) and supplements.  This website has more  information on Eliquis (apixaban): http://www.eliquis.com/eliquis/home

## 2017-06-07 NOTE — Progress Notes (Signed)
Occupational Therapy Discharge Summary  Patient Details  Name: John Bailey MRN: [id number] Date of Birth: Dec 22, 1970  Today's Date: 06/07/2017 OT Individual Time: 0800-0920 OT Individual Time Calculation (min): 80 min    Session Note:  Pt completed bathing and dressing during session.  He was also able to transfer to the toilet with use of the RW with platform and min assist.  He completed toileting tasks with min assist as well as bathing and dressing sit to stand at the sink.  Therapist assisted with washing feet only and then he was able to complete the rest.  He utilized the shoe funnel for donning shoes with elastic laces already in place.  Pt completed grooming tasks of brushing his teeth, brushing his hair, and applying deodorant with setup only from the wheelchair.  Pt left in wheelchair eating breakfast at end of session.  Call button and phone in reach.   Patient has met 9 of 9 long term goals due to improved activity tolerance, improved balance, ability to compensate for deficits, functional use of  RIGHT upper and LEFT upper extremity and improved coordination.  Patient to discharge at [facility] Assist level.  Patient's care partner is independent to provide the necessary physical assistance at discharge.    Reasons goals not met: NA  Recommendation:  Patient will benefit from ongoing skilled OT services in home health setting to continue to advance functional skills in the area of BADL, iADL, Vocation and Reduce care partner burden.  Pt will need continued OT home health to further progress ADL function to a modified independent level for increased overall independence.  Pt still demonstrates limitations from RA, especially in the left hand.  Would also benefit from future outpatient OT to further progress hand function and treatment of severe RA.    Equipment: RW with platform and wheelchair  Reasons for discharge: treatment goals met and discharge from  hospital  Patient/family agrees with progress made and goals achieved: Yes  OT Discharge Precautions/Restrictions  Precautions Precautions: Fall Precaution Comments: Severe RA in shoulders, left hand Required Braces or Orthoses: Other Brace/Splint Other Brace/Splint: R ankle brace  Pain Pain Assessment Pain Scale: 0-10 Pain Score: 0-No pain Faces Pain Scale: Hurts little more Pain Type: Acute pain Pain Location: Buttocks Pain Descriptors / Indicators: Discomfort Pain Onset: With Activity Pain Intervention(s): Medication (See eMAR)(pre treat ) ADL  See Function Section of chart for details  Vision Baseline Vision/History: Wears glasses Wears Glasses: At all times Patient Visual Report: No change from baseline Vision Assessment?: No apparent visual deficits Perception  Perception: Within Functional Limits Praxis Praxis: Intact Cognition Overall Cognitive Status: Within Functional Limits for tasks assessed Arousal/Alertness: Awake/alert Orientation Level: Oriented X4 Attention: Selective Selective Attention: Appears intact Memory: Appears intact Awareness: Appears intact Safety/Judgment: Appears intact Sensation Sensation Light Touch: Appears Intact(Light touch grossly intact in BUEs with testing.) Stereognosis: Appears Intact(in right hand, not tested in the left hand) Hot/Cold: Appears Intact Proprioception: Appears Intact Coordination Gross Motor Movements are Fluid and Coordinated: No Fine Motor Movements are Fluid and Coordinated: No Coordination and Movement Description: Impaired BUE FM and gross motor coordination secondary to RA but uses both at a diminshed level with the left hand being the most limited with AROM. Motor  Motor Motor - Discharge Observations: generalized weakness Mobility  Transfers Transfers: Sit to Stand;Stand to Sit Sit to Stand: 4: Min assist;From elevated surface;With upper extremity assist;From chair/3-in-1;With armrests Stand to  Sit: With upper extremity assist;5: Supervision;With armrests;To chair/3-in-1  Trunk/Postural Assessment  Cervical Assessment Cervical Assessment: Within Functional Limits Thoracic Assessment Thoracic Assessment: Exceptions to WFL(slight thoracic rounding) Lumbar Assessment Lumbar Assessment: Exceptions to WFL(lumbar flexion in standing)  Balance Balance Balance Assessed: Yes Static Sitting Balance Static Sitting - Level of Assistance: 6: Modified independent (Device/Increase time) Dynamic Sitting Balance Dynamic Sitting - Level of Assistance: 6: Modified independent (Device/Increase time) Static Standing Balance Static Standing - Balance Support: Bilateral upper extremity supported;During functional activity Static Standing - Level of Assistance: 5: Stand by assistance Dynamic Standing Balance Dynamic Standing - Balance Support: During functional activity Dynamic Standing - Level of Assistance: 5: Stand by assistance Extremity/Trunk Assessment RUE Assessment RUE Assessment: Exceptions to [facility] shoulder flexion 0-150 degrees, Elbow and wrist AROM WFLS.  Gross grasp and release present but weak secondary to RA.  Decreased FM coordination as well) LUE Assessment LUE Assessment: Exceptions to [facility] shoulder flexion 0-120 degrees, elbow AROM WFLS,  limited AROM at wrist flexion to approxiamtely 50% of normal.  Increased swelling in hand with limited MP, PIP, and DIP AROM to less than 25% of normal. )   See Function Navigator for Current Functional Status.  Zamara Cozad OTR/L 06/07/2017, 12:20 PM

## 2017-06-08 ENCOUNTER — Other Ambulatory Visit: Payer: Self-pay

## 2017-06-08 ENCOUNTER — Encounter: Payer: Self-pay | Admitting: Physician Assistant

## 2017-06-08 ENCOUNTER — Inpatient Hospital Stay (HOSPITAL_COMMUNITY): Payer: BLUE CROSS/BLUE SHIELD | Admitting: Occupational Therapy

## 2017-06-08 ENCOUNTER — Inpatient Hospital Stay (HOSPITAL_COMMUNITY)
Admission: AD | Admit: 2017-06-08 | Discharge: 2017-07-04 | DRG: 981 | Disposition: A | Discharge status: Skilled Nursing Facility | Payer: BLUE CROSS/BLUE SHIELD | Attending: Family Medicine | Admitting: Family Medicine

## 2017-06-08 DIAGNOSIS — E1129 Type 2 diabetes mellitus with other diabetic kidney complication: Secondary | ICD-10-CM | POA: Diagnosis not present

## 2017-06-08 DIAGNOSIS — L0231 Cutaneous abscess of buttock: Secondary | ICD-10-CM | POA: Diagnosis not present

## 2017-06-08 DIAGNOSIS — I1310 Hypertensive heart and chronic kidney disease without heart failure, with stage 1 through stage 4 chronic kidney disease, or unspecified chronic kidney disease: Secondary | ICD-10-CM | POA: Diagnosis not present

## 2017-06-08 DIAGNOSIS — Z794 Long term (current) use of insulin: Secondary | ICD-10-CM | POA: Diagnosis not present

## 2017-06-08 DIAGNOSIS — Z9889 Other specified postprocedural states: Secondary | ICD-10-CM | POA: Diagnosis not present

## 2017-06-08 DIAGNOSIS — R0602 Shortness of breath: Secondary | ICD-10-CM

## 2017-06-08 DIAGNOSIS — Z6838 Body mass index (BMI) 38.0-38.9, adult: Secondary | ICD-10-CM

## 2017-06-08 DIAGNOSIS — I132 Hypertensive heart and chronic kidney disease with heart failure and with stage 5 chronic kidney disease, or end stage renal disease: Secondary | ICD-10-CM | POA: Diagnosis present

## 2017-06-08 DIAGNOSIS — I119 Hypertensive heart disease without heart failure: Secondary | ICD-10-CM | POA: Diagnosis present

## 2017-06-08 DIAGNOSIS — E1169 Type 2 diabetes mellitus with other specified complication: Principal | ICD-10-CM | POA: Diagnosis present

## 2017-06-08 DIAGNOSIS — E1165 Type 2 diabetes mellitus with hyperglycemia: Secondary | ICD-10-CM | POA: Diagnosis present

## 2017-06-08 DIAGNOSIS — A4159 Other Gram-negative sepsis: Secondary | ICD-10-CM | POA: Diagnosis not present

## 2017-06-08 DIAGNOSIS — E785 Hyperlipidemia, unspecified: Secondary | ICD-10-CM | POA: Diagnosis not present

## 2017-06-08 DIAGNOSIS — R739 Hyperglycemia, unspecified: Secondary | ICD-10-CM | POA: Diagnosis not present

## 2017-06-08 DIAGNOSIS — G4731 Primary central sleep apnea: Secondary | ICD-10-CM

## 2017-06-08 DIAGNOSIS — R7309 Other abnormal glucose: Secondary | ICD-10-CM | POA: Diagnosis not present

## 2017-06-08 DIAGNOSIS — S31000A Unspecified open wound of lower back and pelvis without penetration into retroperitoneum, initial encounter: Secondary | ICD-10-CM

## 2017-06-08 DIAGNOSIS — D631 Anemia in chronic kidney disease: Secondary | ICD-10-CM | POA: Diagnosis present

## 2017-06-08 DIAGNOSIS — I10 Essential (primary) hypertension: Secondary | ICD-10-CM | POA: Diagnosis not present

## 2017-06-08 DIAGNOSIS — G9341 Metabolic encephalopathy: Secondary | ICD-10-CM | POA: Diagnosis not present

## 2017-06-08 DIAGNOSIS — L089 Local infection of the skin and subcutaneous tissue, unspecified: Secondary | ICD-10-CM | POA: Diagnosis not present

## 2017-06-08 DIAGNOSIS — E1122 Type 2 diabetes mellitus with diabetic chronic kidney disease: Secondary | ICD-10-CM | POA: Diagnosis present

## 2017-06-08 DIAGNOSIS — N289 Disorder of kidney and ureter, unspecified: Secondary | ICD-10-CM | POA: Diagnosis present

## 2017-06-08 DIAGNOSIS — R0989 Other specified symptoms and signs involving the circulatory and respiratory systems: Secondary | ICD-10-CM | POA: Diagnosis not present

## 2017-06-08 DIAGNOSIS — D638 Anemia in other chronic diseases classified elsewhere: Secondary | ICD-10-CM | POA: Diagnosis not present

## 2017-06-08 DIAGNOSIS — R6520 Severe sepsis without septic shock: Secondary | ICD-10-CM | POA: Diagnosis not present

## 2017-06-08 DIAGNOSIS — I428 Other cardiomyopathies: Secondary | ICD-10-CM | POA: Diagnosis present

## 2017-06-08 DIAGNOSIS — S31000D Unspecified open wound of lower back and pelvis without penetration into retroperitoneum, subsequent encounter: Secondary | ICD-10-CM

## 2017-06-08 DIAGNOSIS — Z992 Dependence on renal dialysis: Secondary | ICD-10-CM | POA: Diagnosis not present

## 2017-06-08 DIAGNOSIS — M792 Neuralgia and neuritis, unspecified: Secondary | ICD-10-CM | POA: Diagnosis not present

## 2017-06-08 DIAGNOSIS — M4628 Osteomyelitis of vertebra, sacral and sacrococcygeal region: Secondary | ICD-10-CM | POA: Diagnosis not present

## 2017-06-08 DIAGNOSIS — D62 Acute posthemorrhagic anemia: Secondary | ICD-10-CM | POA: Diagnosis not present

## 2017-06-08 DIAGNOSIS — IMO0001 Reserved for inherently not codable concepts without codable children: Secondary | ICD-10-CM

## 2017-06-08 DIAGNOSIS — Z79899 Other long term (current) drug therapy: Secondary | ICD-10-CM

## 2017-06-08 DIAGNOSIS — L89159 Pressure ulcer of sacral region, unspecified stage: Secondary | ICD-10-CM | POA: Diagnosis not present

## 2017-06-08 DIAGNOSIS — I480 Paroxysmal atrial fibrillation: Secondary | ICD-10-CM | POA: Diagnosis present

## 2017-06-08 DIAGNOSIS — Z86718 Personal history of other venous thrombosis and embolism: Secondary | ICD-10-CM

## 2017-06-08 DIAGNOSIS — N189 Chronic kidney disease, unspecified: Secondary | ICD-10-CM

## 2017-06-08 DIAGNOSIS — E1151 Type 2 diabetes mellitus with diabetic peripheral angiopathy without gangrene: Secondary | ICD-10-CM | POA: Diagnosis present

## 2017-06-08 DIAGNOSIS — L89150 Pressure ulcer of sacral region, unstageable: Secondary | ICD-10-CM | POA: Diagnosis present

## 2017-06-08 DIAGNOSIS — Z8249 Family history of ischemic heart disease and other diseases of the circulatory system: Secondary | ICD-10-CM

## 2017-06-08 DIAGNOSIS — N186 End stage renal disease: Secondary | ICD-10-CM | POA: Diagnosis present

## 2017-06-08 DIAGNOSIS — T380X5A Adverse effect of glucocorticoids and synthetic analogues, initial encounter: Secondary | ICD-10-CM | POA: Diagnosis present

## 2017-06-08 DIAGNOSIS — B957 Other staphylococcus as the cause of diseases classified elsewhere: Secondary | ICD-10-CM | POA: Diagnosis not present

## 2017-06-08 DIAGNOSIS — Z7901 Long term (current) use of anticoagulants: Secondary | ICD-10-CM

## 2017-06-08 DIAGNOSIS — D649 Anemia, unspecified: Secondary | ICD-10-CM | POA: Diagnosis not present

## 2017-06-08 DIAGNOSIS — K869 Disease of pancreas, unspecified: Secondary | ICD-10-CM | POA: Diagnosis present

## 2017-06-08 DIAGNOSIS — B009 Herpesviral infection, unspecified: Secondary | ICD-10-CM | POA: Diagnosis present

## 2017-06-08 DIAGNOSIS — E11649 Type 2 diabetes mellitus with hypoglycemia without coma: Secondary | ICD-10-CM | POA: Diagnosis present

## 2017-06-08 DIAGNOSIS — I5032 Chronic diastolic (congestive) heart failure: Secondary | ICD-10-CM | POA: Diagnosis present

## 2017-06-08 DIAGNOSIS — E1142 Type 2 diabetes mellitus with diabetic polyneuropathy: Secondary | ICD-10-CM | POA: Diagnosis present

## 2017-06-08 DIAGNOSIS — M069 Rheumatoid arthritis, unspecified: Secondary | ICD-10-CM | POA: Diagnosis present

## 2017-06-08 DIAGNOSIS — Z8349 Family history of other endocrine, nutritional and metabolic diseases: Secondary | ICD-10-CM

## 2017-06-08 DIAGNOSIS — I130 Hypertensive heart and chronic kidney disease with heart failure and stage 1 through stage 4 chronic kidney disease, or unspecified chronic kidney disease: Secondary | ICD-10-CM | POA: Diagnosis not present

## 2017-06-08 DIAGNOSIS — R509 Fever, unspecified: Secondary | ICD-10-CM | POA: Diagnosis not present

## 2017-06-08 DIAGNOSIS — B9689 Other specified bacterial agents as the cause of diseases classified elsewhere: Secondary | ICD-10-CM | POA: Diagnosis not present

## 2017-06-08 DIAGNOSIS — I110 Hypertensive heart disease with heart failure: Secondary | ICD-10-CM | POA: Diagnosis present

## 2017-06-08 DIAGNOSIS — R5381 Other malaise: Secondary | ICD-10-CM | POA: Diagnosis not present

## 2017-06-08 DIAGNOSIS — D72829 Elevated white blood cell count, unspecified: Secondary | ICD-10-CM | POA: Diagnosis not present

## 2017-06-08 DIAGNOSIS — E11622 Type 2 diabetes mellitus with other skin ulcer: Secondary | ICD-10-CM | POA: Diagnosis not present

## 2017-06-08 DIAGNOSIS — R000 Tachycardia, unspecified: Secondary | ICD-10-CM | POA: Diagnosis not present

## 2017-06-08 DIAGNOSIS — I5033 Acute on chronic diastolic (congestive) heart failure: Secondary | ICD-10-CM | POA: Diagnosis not present

## 2017-06-08 DIAGNOSIS — M869 Osteomyelitis, unspecified: Secondary | ICD-10-CM | POA: Diagnosis not present

## 2017-06-08 DIAGNOSIS — G4739 Other sleep apnea: Secondary | ICD-10-CM

## 2017-06-08 DIAGNOSIS — Z9119 Patient's noncompliance with other medical treatment and regimen: Secondary | ICD-10-CM | POA: Diagnosis not present

## 2017-06-08 DIAGNOSIS — R197 Diarrhea, unspecified: Secondary | ICD-10-CM | POA: Diagnosis not present

## 2017-06-08 HISTORY — DX: End stage renal disease: Z99.2

## 2017-06-08 HISTORY — DX: Dependence on renal dialysis: N18.6

## 2017-06-08 LAB — CBC
HCT: 25 % — ABNORMAL LOW (ref 39.0–52.0)
Hemoglobin: 7.9 g/dL — ABNORMAL LOW (ref 13.0–17.0)
MCH: 27.2 pg (ref 26.0–34.0)
MCHC: 31.6 g/dL (ref 30.0–36.0)
MCV: 86.2 fL (ref 78.0–100.0)
PLATELETS: 285 10*3/uL (ref 150–400)
RBC: 2.9 MIL/uL — AB (ref 4.22–5.81)
RDW: 22.2 % — ABNORMAL HIGH (ref 11.5–15.5)
WBC: 24.6 10*3/uL — ABNORMAL HIGH (ref 4.0–10.5)

## 2017-06-08 LAB — GLUCOSE, CAPILLARY
Glucose-Capillary: 429 mg/dL — ABNORMAL HIGH (ref 65–99)
Glucose-Capillary: 178 mg/dL — ABNORMAL HIGH (ref 65–99)
Glucose-Capillary: 338 mg/dL — ABNORMAL HIGH (ref 65–99)
Glucose-Capillary: 190 mg/dL — ABNORMAL HIGH (ref 65–99)

## 2017-06-08 LAB — HEMOGLOBIN A1C
HEMOGLOBIN A1C: 6.9 % — AB (ref 4.8–5.6)
Mean Plasma Glucose: 151.33 mg/dL

## 2017-06-08 LAB — RENAL FUNCTION PANEL
Albumin: 2.4 g/dL — ABNORMAL LOW (ref 3.5–5.0)
Anion gap: 15 (ref 5–15)
BUN: 79 mg/dL — AB (ref 6–20)
CHLORIDE: 96 mmol/L — AB (ref 101–111)
CO2: 20 mmol/L — AB (ref 22–32)
CREATININE: 7.15 mg/dL — AB (ref 0.61–1.24)
Calcium: 8.7 mg/dL — ABNORMAL LOW (ref 8.9–10.3)
GFR calc non Af Amer: 8 mL/min — ABNORMAL LOW (ref >60)
GFR, EST AFRICAN AMERICAN: 10 mL/min — AB (ref >60)
Glucose, Bld: 420 mg/dL — ABNORMAL HIGH (ref 65–99)
Phosphorus: 6.4 mg/dL — ABNORMAL HIGH (ref 2.5–4.6)
Potassium: 4.5 mmol/L (ref 3.5–5.1)
Sodium: 131 mmol/L — ABNORMAL LOW (ref 135–145)

## 2017-06-08 LAB — MRSA PCR SCREENING: MRSA BY PCR: NEGATIVE

## 2017-06-08 MED ORDER — PREDNISONE 10 MG PO TABS
10.0000 mg | ORAL_TABLET | Freq: Three times a day (TID) | ORAL | Status: AC
  Administered 2017-06-10 (×2): 10 mg via ORAL
  Filled 2017-06-08 (×2): qty 1

## 2017-06-08 MED ORDER — CARVEDILOL 6.25 MG PO TABS: 6.2500 mg | ORAL_TABLET | Freq: Two times a day (BID) | ORAL | 0 refills | Status: DC

## 2017-06-08 MED ORDER — CALCITRIOL 0.25 MCG PO CAPS
0.5000 ug | ORAL_CAPSULE | ORAL | Status: DC
  Administered 2017-06-08 – 2017-07-03 (×12): 0.5 ug via ORAL
  Filled 2017-06-08 (×2): qty 2
  Filled 2017-06-08: qty 1
  Filled 2017-06-08 (×4): qty 2
  Filled 2017-06-08: qty 1
  Filled 2017-06-08 (×2): qty 2
  Filled 2017-06-08: qty 1

## 2017-06-08 MED ORDER — GABAPENTIN 300 MG PO CAPS: 300.0000 mg | ORAL_CAPSULE | Freq: Every day | ORAL | 0 refills | Status: DC

## 2017-06-08 MED ORDER — CARVEDILOL 6.25 MG PO TABS
6.2500 mg | ORAL_TABLET | Freq: Two times a day (BID) | ORAL | Status: DC
  Administered 2017-06-08 – 2017-06-13 (×10): 6.25 mg via ORAL
  Filled 2017-06-08 (×10): qty 1

## 2017-06-08 MED ORDER — CALCITRIOL 0.5 MCG PO CAPS: 0.5000 ug | ORAL_CAPSULE | ORAL | 0 refills | Status: AC

## 2017-06-08 MED ORDER — INSULIN ASPART 100 UNIT/ML ~~LOC~~ SOLN
0.0000 [IU] | Freq: Three times a day (TID) | SUBCUTANEOUS | Status: DC
  Administered 2017-06-08 – 2017-06-09 (×2): 7 [IU] via SUBCUTANEOUS
  Administered 2017-06-09: 5 [IU] via SUBCUTANEOUS

## 2017-06-08 MED ORDER — INSULIN GLARGINE 100 UNIT/ML ~~LOC~~ SOLN
30.0000 [IU] | Freq: Every day | SUBCUTANEOUS | Status: DC
  Filled 2017-06-08: qty 0.3

## 2017-06-08 MED ORDER — APIXABAN 5 MG PO TABS
5.0000 mg | ORAL_TABLET | Freq: Two times a day (BID) | ORAL | Status: DC
  Administered 2017-06-08 – 2017-06-14 (×12): 5 mg via ORAL
  Filled 2017-06-08 (×12): qty 1

## 2017-06-08 MED ORDER — ONDANSETRON HCL 4 MG PO TABS: 4.0000 mg | ORAL_TABLET | Freq: Four times a day (QID) | ORAL | Status: DC | PRN

## 2017-06-08 MED ORDER — LIDOCAINE HCL (PF) 1 % IJ SOLN: 5.0000 mL | INTRAMUSCULAR | Status: DC | PRN

## 2017-06-08 MED ORDER — SODIUM CHLORIDE 0.9 % IV SOLN: 100.0000 mL | INTRAVENOUS | Status: DC | PRN

## 2017-06-08 MED ORDER — ALTEPLASE 2 MG IJ SOLR
2.0000 mg | Freq: Once | INTRAMUSCULAR | Status: DC | PRN
  Filled 2017-06-08: qty 2

## 2017-06-08 MED ORDER — METHOCARBAMOL 500 MG PO TABS
500.0000 mg | ORAL_TABLET | Freq: Four times a day (QID) | ORAL | Status: DC | PRN
  Administered 2017-06-19: 500 mg via ORAL
  Filled 2017-06-08: qty 1

## 2017-06-08 MED ORDER — PREDNISONE 20 MG PO TABS
20.0000 mg | ORAL_TABLET | Freq: Two times a day (BID) | ORAL | Status: AC
  Administered 2017-06-08 – 2017-06-09 (×3): 20 mg via ORAL
  Filled 2017-06-08 (×3): qty 1

## 2017-06-08 MED ORDER — HEPARIN SODIUM (PORCINE) 1000 UNIT/ML DIALYSIS
1000.0000 [IU] | INTRAMUSCULAR | Status: DC | PRN
  Filled 2017-06-08: qty 1

## 2017-06-08 MED ORDER — BISACODYL 10 MG RE SUPP: 10.0000 mg | Freq: Every day | RECTAL | Status: DC | PRN

## 2017-06-08 MED ORDER — DAKINS (1/4 STRENGTH) 0.125 % EX SOLN
Freq: Two times a day (BID) | CUTANEOUS | Status: DC
  Filled 2017-06-08: qty 473

## 2017-06-08 MED ORDER — HYDROCODONE-ACETAMINOPHEN 5-325 MG PO TABS
1.0000 | ORAL_TABLET | ORAL | Status: DC | PRN
  Administered 2017-06-12 – 2017-06-14 (×2): 1 via ORAL
  Filled 2017-06-08: qty 2
  Filled 2017-06-08: qty 1
  Filled 2017-06-08: qty 2
  Filled 2017-06-08: qty 1
  Filled 2017-06-08: qty 2
  Filled 2017-06-08: qty 1

## 2017-06-08 MED ORDER — AMIODARONE HCL 200 MG PO TABS
200.0000 mg | ORAL_TABLET | Freq: Every day | ORAL | Status: DC
  Administered 2017-06-09 – 2017-07-04 (×25): 200 mg via ORAL
  Filled 2017-06-08 (×26): qty 1

## 2017-06-08 MED ORDER — ACETAMINOPHEN 650 MG RE SUPP
650.0000 mg | Freq: Four times a day (QID) | RECTAL | Status: DC | PRN
  Administered 2017-06-17: 650 mg via RECTAL
  Filled 2017-06-08 (×2): qty 1

## 2017-06-08 MED ORDER — ACETAMINOPHEN 325 MG PO TABS
650.0000 mg | ORAL_TABLET | Freq: Four times a day (QID) | ORAL | Status: DC | PRN
  Administered 2017-06-09 – 2017-07-01 (×7): 650 mg via ORAL
  Filled 2017-06-08 (×10): qty 2

## 2017-06-08 MED ORDER — PREDNISONE 10 MG PO TABS
10.0000 mg | ORAL_TABLET | Freq: Two times a day (BID) | ORAL | Status: AC
  Administered 2017-06-11 (×2): 10 mg via ORAL
  Filled 2017-06-08 (×2): qty 1

## 2017-06-08 MED ORDER — PREDNISONE 10 MG (21) PO TBPK: ORAL_TABLET | ORAL | 0 refills | Status: DC

## 2017-06-08 MED ORDER — LIDOCAINE-PRILOCAINE 2.5-2.5 % EX CREA
1.0000 "application " | TOPICAL_CREAM | CUTANEOUS | Status: DC | PRN
  Filled 2017-06-08: qty 5

## 2017-06-08 MED ORDER — PREDNISONE 10 MG PO TABS
10.0000 mg | ORAL_TABLET | Freq: Every day | ORAL | Status: AC
  Administered 2017-06-12: 10 mg via ORAL
  Filled 2017-06-08: qty 1

## 2017-06-08 MED ORDER — GABAPENTIN 300 MG PO CAPS
300.0000 mg | ORAL_CAPSULE | Freq: Every day | ORAL | Status: DC
  Administered 2017-06-09 – 2017-06-14 (×6): 300 mg via ORAL
  Filled 2017-06-08 (×6): qty 1

## 2017-06-08 MED ORDER — AMIODARONE HCL 200 MG PO TABS: 200.0000 mg | ORAL_TABLET | Freq: Every day | ORAL | 0 refills | Status: AC

## 2017-06-08 MED ORDER — APIXABAN 5 MG PO TABS: 5.0000 mg | ORAL_TABLET | Freq: Two times a day (BID) | ORAL | 0 refills | Status: AC

## 2017-06-08 MED ORDER — INSULIN GLARGINE 100 UNITS/ML SOLOSTAR PEN: 30.0000 [IU] | PEN_INJECTOR | Freq: Every day | SUBCUTANEOUS | 11 refills | Status: DC

## 2017-06-08 MED ORDER — ONDANSETRON HCL 4 MG/2ML IJ SOLN
4.0000 mg | Freq: Four times a day (QID) | INTRAMUSCULAR | Status: DC | PRN
  Administered 2017-06-14: 4 mg via INTRAVENOUS

## 2017-06-08 MED ORDER — PENTAFLUOROPROP-TETRAFLUOROETH EX AERO: 1.0000 "application " | INHALATION_SPRAY | CUTANEOUS | Status: DC | PRN

## 2017-06-08 MED ORDER — SENNOSIDES-DOCUSATE SODIUM 8.6-50 MG PO TABS
1.0000 | ORAL_TABLET | Freq: Every evening | ORAL | Status: DC | PRN
  Administered 2017-06-08: 1 via ORAL
  Filled 2017-06-08: qty 1

## 2017-06-08 MED ORDER — PREDNISONE 10 MG (21) PO TBPK: ORAL_TABLET | Freq: Four times a day (QID) | ORAL | Status: DC

## 2017-06-08 MED ORDER — METHOCARBAMOL 500 MG PO TABS: 500.0000 mg | ORAL_TABLET | Freq: Four times a day (QID) | ORAL | 0 refills | Status: DC | PRN

## 2017-06-08 NOTE — H&P (Addendum)
History and Physical    John Bailey:[id number] DOB: 1970-10-09 DOA: 06/08/2017   PCP: [name], MD   Patient coming from:  Home    Chief Complaint: Sacral Wound   HPI: John Bailey is a 47 y.o. 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  The patient had a very eventful hospital stay, involving multiple specialties, including cardiology, and nephrology in order to receive dialysis..  For detailed reference please see discharge summary from Dr. Niel Hummer and Rehab Medicine discharge summary.  During his time at the rehab unit, he was treated for debility, secondary to cardiorenal syndrome, attending therapist, working on energy conservation techniques, with significant improvement of his overall health status.  Mentioned above, the patient had a sacral coccyx pressure ulcer, which required wound care and a short course of hydrotherapy.  He also had skin care.  He underwent debridement of sacral decubitus ulcer on 06/04/2017 by Dr. Grandville Silos, and wet-to-dry dressings were placed.  He was to be discharged today and continue outpatient ambulatory referral to the wound center, but surgical consultation today, deemed the patient not ready to be discharged, as it requires further care, to prevent the patient from being readmitted, and to avoid sepsis infection.  However, as the patient's overall stamina has improved, and he has maxed out his rehab stay, [facility] has been requested to admit the patient, to help in the management  of his medical issues, until the patient is ready for discharge from the surgical standpoint.The patient denies any fever, chills, night sweats, chest pain, palpitations, shortness of breath, nausea or vomiting.  He denies any abdominal pain.  He denies any lower extremity swelling.  He is ambulating with assistance.  ED Course:  There were no vitals taken for this visit.  Glucose today 178, sodium 131, with glucose 420, corrected Creatinine 7.15, the patient is for dialysis as per nephrology White count 24.6, the patient is taking prednisone Hemoglobin 7.9, platelets 285 Echo 04/25/17  EF 81-85, normal systolic  Review of Systems:  As per HPI otherwise all other systems reviewed and are negative  Past Medical History:  Diagnosis Date  . Atrial flutter with rapid ventricular response ([place]) 08/06/2015   a. s/p DCCV in 07/2015 with recurrent atrial fibrillation and DCCV in 12/2016. Initially successful but noted to be back in atrial fibrillation within 2 weeks of DCCV.   [name] Chronic diastolic (congestive) heart failure (HCC)    a. EF 50-55% by echo in 06/2016.  . Diabetes ([place])   . Dysrhythmia    Aflutter  . ESRD (end stage renal disease) on dialysis ([place])    "TTS; at [facility]" (06/08/2017)  . Hypertension   . Peripheral vascular disease ([place])   . Rheumatoid arthritis [facility])     Past Surgical History:  Procedure Laterality Date  . AV FISTULA PLACEMENT Left 05/10/2017   Procedure: CREATION of Left Radicephalic Fistula;  Surgeon: Rosetta Posner, MD;  Location: [place];  Service: Vascular;  Laterality: Left;  . CARDIOVERSION N/A 08/01/2015   Procedure: CARDIOVERSION;  Surgeon: Josue Hector, MD;  Location: [place];  Service: Cardiovascular;  Laterality: N/A;  . CARDIOVERSION N/A 12/18/2016   Procedure: CARDIOVERSION;  Surgeon: [name], MD;  Location: [facility] ENDOSCOPY;  Service: Cardiovascular;  Laterality: N/A;  . CARDIOVERSION N/A 04/30/2017   Procedure: CARDIOVERSION;  Surgeon: Larey Dresser, MD;  Location: [facility] ENDOSCOPY;  Service: Cardiovascular;  Laterality: N/A;  . CHOLECYSTECTOMY    . DEBRIDMENT OF DECUBITUS ULCER N/A 06/04/2017   Procedure: DEBRIDMENT OF SACRAL DECUBITUS ULCER;  Surgeon: Georganna Skeans, MD;  Location: [place];  Service: General;  Laterality: N/A;  . INSERTION OF DIALYSIS CATHETER Right 05/04/2017   Procedure: INSERTION OF TUNNELED  DIALYSIS CATHETER;  Surgeon: Conrad , MD;  Location: [place];  Service: Vascular;  Laterality: Right;  . IR FLUORO GUIDE CV LINE RIGHT  04/27/2017  . IR US GUIDE VASC ACCESS RIGHT  04/27/2017  . TEE WITHOUT CARDIOVERSION N/A 08/01/2015   Procedure: TRANSESOPHAGEAL ECHOCARDIOGRAM (TEE);  Surgeon: Josue Hector, MD;  Location: [facility] ENDOSCOPY;  Service: Cardiovascular;  Laterality: N/A;    Social History Social History   Socioeconomic History  . Marital status: Married    Spouse name: Not on file  . Number of children: Not on file  . Years of education: Not on file  . Highest education level: Not on file  Occupational History  . Not on file  Social Needs  . Financial resource strain: Not on file  . Food insecurity:    Worry: Not on file    Inability: Not on file  . Transportation needs:    Medical: Not on file    Non-medical: Not on file  Tobacco Use  . Smoking status: Never Smoker  . Smokeless tobacco: Never Used  Substance and Sexual Activity  . Alcohol use: No  . Drug use: No  . Sexual activity: Yes  Lifestyle  . Physical activity:    Days per week: Not on file    Minutes per session: Not on file  . Stress: Not on file  Relationships  . Social connections:    Talks on phone: Not on file    Gets together: Not on file    Attends religious service: Not on file    Active member of club or organization: Not on file    Attends meetings of clubs or organizations: Not on file    Relationship status: Not on file  . Intimate partner violence:    Fear of current or ex partner: Not on file    Emotionally  abused: Not on file    Physically abused: Not on file    Forced sexual activity: Not on file  Other Topics Concern  . Not on file  Social History Narrative  . Not on file     No Known Allergies  Family History  Problem Relation Age of Onset  . Heart disease Mother   . Hyperlipidemia Father   . Hypertension Father       Prior to Admission medications   Medication Sig Start Date End Date Taking? Authorizing Provider  acetaminophen (TYLENOL) 325 MG tablet Take 650 mg by mouth every 6 (six) hours as needed for mild pain.    [provider]  albuterol (PROVENTIL HFA;VENTOLIN HFA) 108 (90 BASE) MCG/ACT inhaler Inhale 1-2 puffs into the lungs every 6 (six) hours as needed for wheezing. 05/30/14   Nahser, Wonda Cheng, MD  amiodarone (PACERONE) 200 MG tablet Take 1 tablet (200 mg total) by mouth 2 (two) times daily. 05/13/17   Regalado, Cassie Freer, MD  amiodarone (PACERONE) 200 MG tablet Take 1 tablet (200 mg total) by mouth daily. 06/08/17   Angiulli, Lavon Paganini, PA-C  apixaban (ELIQUIS) 5 MG TABS tablet Take 1 tablet (5 mg total) by mouth 2 (two) times daily. 06/08/17   Angiulli, Lavon Paganini, PA-C  calcitRIOL (ROCALTROL) 0.5 MCG capsule Take 1 capsule (0.5 mcg total) by mouth every other day. Patient not taking: Reported on 05/14/2017 05/13/17   Niel Hummer A, MD  calcitRIOL (ROCALTROL) 0.5 MCG capsule Take 1 capsule (0.5 mcg total) by mouth every Tuesday, Thursday, and Saturday at 6 PM. 06/08/17   Angiulli, Lavon Paganini, PA-C  carvedilol (COREG) 6.25 MG tablet Take 1 tablet (6.25 mg total) by mouth 2 (two) times daily with a meal. 06/08/17   Angiulli, Lavon Paganini, PA-C  colchicine 0.6 MG tablet Take 0.5 tablets (0.3 mg total) by mouth daily. Patient not taking: Reported on 05/14/2017 12/16/16   Barton Dubois, MD  diltiazem (CARDIZEM CD) 360 MG 24 hr capsule Take 360 mg by mouth daily. 05/08/17   [provider]  fluticasone (FLONASE) 50 MCG/ACT nasal spray Place 2 sprays into both nostrils  daily as needed for allergies.  12/29/12   [provider]  gabapentin (NEURONTIN) 300 MG capsule Take 1 capsule (300 mg total) by mouth daily. 06/08/17   Angiulli, Lavon Paganini, PA-C  insulin aspart (NOVOLOG) 100 UNIT/ML injection Inject 3 Units into the skin 3 (three) times daily with meals. Patient taking differently: Inject 3 Units into the skin 3 (three) times daily with meals. Per sliding scale as directed by physician 05/13/17   Niel Hummer A, MD  Insulin Glargine (LANTUS) 100 UNIT/ML Solostar Pen Inject 25 Units into the skin daily at 10 pm. 03/09/17   Debbe Odea, MD  insulin glargine (LANTUS) 100 unit/mL SOPN Inject 0.3 mLs (30 Units total) into the skin at bedtime. 06/08/17   Angiulli, Lavon Paganini, PA-C  Insulin Pen Needle 31G X 6 MM MISC 1 Device by Does not apply route 2 (two) times daily. 08/08/15   Charlynne Cousins, MD  methocarbamol (ROBAXIN) 500 MG tablet Take 1 tablet (500 mg total) by mouth every 8 (eight) hours as needed for muscle spasms. 05/13/17   Regalado, Belkys A, MD  methocarbamol (ROBAXIN) 500 MG tablet Take 1 tablet (500 mg total) by mouth every 6 (six) hours as needed for muscle spasms. 06/08/17   Angiulli, Lavon Paganini, PA-C  predniSONE (DELTASONE) 10 MG tablet Take 1 tablet (10 mg total) by mouth daily with breakfast. Patient not taking: Reported on 05/14/2017 05/14/17   Regalado, Jerald Kief A, MD  predniSONE (STERAPRED UNI-PAK 21 TAB) 10 MG (21) TBPK tablet 1 tab 4 times daily times 2 days and stop 06/08/17   Angiulli, Lavon Paganini, PA-C    Physical Exam:  There were no vitals filed for this visit. Constitutional: NAD, calm, comfortable, sitting in his wheelchair Eyes: PERRL, lids and conjunctivae normal ENMT: Mucous membranes are moist, without exudate or lesions  Neck: normal, supple, no masses, no thyromegaly Respiratory: clear to auscultation bilaterally, no wheezing, no crackles. Normal respiratory effort  Cardiovascular: Irregularly irregular rate and rhythm,   murmur, rubs or gallops. No extremity edema. 2+ pedal pulses. No carotid bruits.  Abdomen: Soft, obese non tender, No hepatosplenomegaly. Bowel sounds positive.  Musculoskeletal: no clubbing / cyanosis. Moves all extremities Skin: no jaundice, the patient has known sacral wound, status post debridement, followed by surgery Neurologic: Sensation intact  Strength equal in all extremities Psychiatric:   Alert and oriented  x 3.  Flat affect    Labs on Admission: I have personally reviewed following labs and imaging studies  CBC: Recent Labs  Lab 06/01/17 1708 06/03/17 1047 06/04/17 1210 06/05/17 0803 06/08/17 0833  WBC 23.6* 21.4*  --  22.3* 24.6*  NEUTROABS  --  18.4*  --   --   --   HGB 9.4* 9.6* 9.2* 9.4* 7.9*  HCT 29.2* 30.4* 27.0* 30.2* 25.0*  MCV 86.4 88.1  --  89.9 86.2  PLT 493* 368  --  296 329    Basic Metabolic Panel: Recent Labs  Lab 06/01/17 1708 06/03/17 1530 06/04/17 1210 06/05/17 0803 06/08/17 0832  NA 134* 134* 140 134* 131*  K 3.6 3.6 2.9* 3.5 4.5  CL 96* 98*  --  98* 96*  CO2 23 23  --  19* 20*  GLUCOSE 111* 269* 67 128* 420*  BUN 128* 110*  --  63* 79*  CREATININE 7.22* 6.37*  --  5.45* 7.15*  CALCIUM 9.2 8.6*  --  8.3* 8.7*  PHOS 4.4 4.6  --  4.2 6.4*    GFR: Estimated Creatinine Clearance: 17.8 mL/min (A) (by C-G formula based on SCr of 7.15 mg/dL (H)).  Liver Function Tests: Recent Labs  Lab 06/01/17 1708 06/03/17 1530 06/05/17 0803 06/08/17 0832  ALBUMIN 2.8* 2.5* 2.6* 2.4*   No results for input(s): LIPASE, AMYLASE in the last 168 hours. No results for input(s): AMMONIA in the last 168 hours.  Coagulation Profile: No results for input(s): INR, PROTIME in the last 168 hours.  Cardiac Enzymes: No results for input(s): CKTOTAL, CKMB, CKMBINDEX, TROPONINI in the last 168 hours.  BNP (last 3 results) No results for input(s): PROBNP in the last 8760 hours.  HbA1C: No results for input(s): HGBA1C in the last 72  hours.  CBG: Recent Labs  Lab 06/07/17 1616 06/07/17 2206 06/08/17 0627 06/08/17 1258 06/08/17 1627  GLUCAP 272* 334* 429* 178* 338*    Lipid Profile: No results for input(s): CHOL, HDL, LDLCALC, TRIG, CHOLHDL, LDLDIRECT in the last 72 hours.  Thyroid Function Tests: No results for input(s): TSH, T4TOTAL, FREET4, T3FREE, THYROIDAB in the last 72 hours.  Anemia Panel: No results for input(s): VITAMINB12, FOLATE, FERRITIN, TIBC, IRON, RETICCTPCT in the last 72 hours.  Urine analysis:    Component Value Date/Time   COLORURINE YELLOW 04/25/2017 2057   APPEARANCEUR CLEAR 04/25/2017 2057   LABSPEC 1.010 04/25/2017 2057   PHURINE 5.0 04/25/2017 2057   GLUCOSEU NEGATIVE 04/25/2017 2057   HGBUR NEGATIVE 04/25/2017 2057   BILIRUBINUR NEGATIVE 04/25/2017 2057   KETONESUR NEGATIVE 04/25/2017 2057   PROTEINUR NEGATIVE 04/25/2017 2057   NITRITE NEGATIVE 04/25/2017 2057   LEUKOCYTESUR NEGATIVE 04/25/2017 2057    Sepsis Labs: @LABRCNTIP (procalcitonin:4,lacticidven:4) )No results found for this or any previous visit (from the past 240 hour(s)).   Radiological Exams on Admission: No results found.  EKG: Independently reviewed.  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 Grandville Silos The patient is now stable,  but does need intensive inpatient wound care twice a day, as well as hydrotherapy daily.  Dakin's solutions has been added. Other plans as per surgery, appreciate their involvement.  ESRD on HD TThS, new diagnosis , Creatinine 7.15, the patient is for dialysis as per nephrology, Dr. Justin Mend  Renal Diet. Other plans as per Nephrology Check CMET in am scheduled upon discharge to be placed at [place]  Type II Diabetes Current blood sugar level is 178 Lab Results  Component Value Date   HGBA1C 9.2 (H) 03/06/2017  Hgb A1C  SSI  Leukocytosis, likely reactive, related to underlying sacral wound and steroids in the setting of RA  ,WBC 24 Afebrile   Repeat CBC in AM No other intervention at this time   Anemia of chronic disease  Baseline Hb 7.9 , no bleeding issues, likely in the setting of poliphlebotomy  Repeat CBC in am  No transfusion is indicated at this time Continue Iron supplements   Atrial Fibrillation CHA2DS2-VASc score 2-3 , on anticoagulation with Eliquis Echo 04/25/17  EF 80-16, normal systolic  Rate controlled Continue meds include Pacerone and Eliquis   Diastolic heart failure.  The patient was admitted with acute heart failure.  Initially he was placed on IV Lasix, discontinued after developing worsening renal failure, requiring dialysis.  Last 2D echo, as mentioned above, EF 55-60%, with severe LVH, dilated left atrium and trivial pericardial effusion at the time.  This is controlled with medications, continue Coreg Since admission, the patient has lost greater than 100 pounds of fluid  Rheumatoid arthritis with flare The patient receives abatacept every Tuesday, continue meds.  Deconditioning due to cardiorenal syndrome, the patient is now with increased strength, with the help of inpatient rehab Continue PT and OT  DVT prophylaxis:  Eliquis  Code Status:    Full  Family Communication:  Discussed with patient Disposition Plan: Expect patient to be discharged to home  after condition improves Consults called:   Surgery, Nephrology  Admission status: MEdSurg IP    Sharene Butters, PA-C [facility]   Amion text  [phone]   06/08/2017, 4:54 PM

## 2017-06-08 NOTE — Progress Notes (Signed)
Social Work  Discharge Note  The overall goal for the admission was met for:   Discharge location: NO TRANSFERRING TO ACUTE FOR MEDICAL ISSUES  Length of Stay: Yes-26 DAYS  Discharge activity level: Yes-SUPERVISION-MIN ASSIST  Home/community participation: Yes  Services provided included: MD, RD, PT, OT, RN, CM, TR, Pharmacy, Neuropsych and SW  Financial Services: Private Insurance: BCBS  Follow-up services arranged: Home Health: [facility]-PT,OT,RN,AIDE,SW, DME: [facility]-HOSPITAL BED, WHEELCHAIR, TUB SEAT, ROLLING WALKER AND LOOW AIR LOSS OVERLAY FOR HOSPITAL BED and Patient/Family request agency HH: [facility] PT-WILL BE SEEN NEXT DAY, DME: NO PREF  Comments (or additional information):WIFE WAS HERE FOR FAMILY TRAINING, [facility] WILL ALSO BE ASSISTING AND PROVIDING TRANSPORTATION TO HD T, TH SAT. HE WANTS M,W,F OP HD AWARE OF THIS AND CURRENTLY NO SLOT AVAILABLE FOR THIS. PT ENCOURAGED TO STAY OFF OF HIS SACRUM AND LAY ON HIS SIDE DUE TO SACRAL WOUND. GAVE [employer] APPLICATION TO APPLY FOR DISABILITY. ALL SET UP ONCE PT DOES GO HOME.  Patient/Family verbalized understanding of follow-up arrangements: Yes  Individual responsible for coordination of the follow-up plan: SELF & TRINIA-WIFE  Confirmed correct DME delivered: Dupree, Rebecca G 06/08/2017    Dupree, Rebecca G 

## 2017-06-08 NOTE — Progress Notes (Signed)
4 Days Post-Op    CC:  Sacral decubitus  Subjective: Patient completed dialysis this morning. He has met his rehab goals and his ready for discharge from CIR.  Currently undergoing hydrotherapy.  Objective: Vital signs in last 24 hours: Temp:  [97.5 F (36.4 C)-98.2 F (36.8 C)] 98.2 F (36.8 C) (03/26 0751) Pulse Rate:  [78-88] 78 (03/26 0900) Resp:  [18-20] 18 (03/26 0751) BP: (125-165)/(81-108) 153/102 (03/26 0900) SpO2:  [100 %] 100 % (03/26 0751) Weight:  [124.5 kg (274 lb 6.4 oz)-124.9 kg (275 lb 5.7 oz)] 124.9 kg (275 lb 5.7 oz) (03/26 0751) Last BM Date: 06/07/17  Intake/Output from previous day: 03/25 0701 - 03/26 0700 In: 680 [P.O.:680] Out: 1 [Stool:1] Intake/Output this shift: No intake/output data recorded.  General appearance: alert, cooperative and no distress HEENT: EOM's intact, pupils equal and round Pulm: effort normal GU:     Lab Results:  Recent Labs    06/08/17 0833  WBC 24.6*  HGB 7.9*  HCT 25.0*  PLT 285    BMET Recent Labs    06/08/17 0832  NA 131*  K 4.5  CL 96*  CO2 20*  GLUCOSE 420*  BUN 79*  CREATININE 7.15*  CALCIUM 8.7*   PT/INR No results for input(s): LABPROT, INR in the last 72 hours.  Recent Labs  Lab 06/01/17 1708 06/03/17 1530 06/05/17 0803 06/08/17 0832  ALBUMIN 2.8* 2.5* 2.6* 2.4*     Lipase  No results found for: LIPASE   Medications: . amiodarone  200 mg Oral Daily  . apixaban  5 mg Oral BID  . calcitRIOL  0.5 mcg Oral Q T,Th,Sat-1800  . carvedilol  6.25 mg Oral BID WC  . gabapentin  300 mg Oral Daily  . insulin aspart  3 Units Subcutaneous TID WC  . insulin glargine  30 Units Subcutaneous QHS  . predniSONE  10 mg Oral 4X daily taper    Assessment/Plan Sacral ulcer S/p DEBRIDMENT OF SACRAL DECUBITUS ULCER [id number] 3/22 Dr. Grandville Silos - POD 4 - surgical path: DENSELY INFLAMED AND NECROTIC FIBROADIPOSE TISSUE, THERE IS NO EVIDENCE OF MALIGNANCY  ID - ancef perioperative FEN - renal  diet VTE - eliquis Foley - none  Plan - Recommend continued inpatient care for his sacral wound. Needs daily hydrotherapy. Will add Dakins solution to BID wet to dry dressing changes for 3 days.    LOS: 26 days    Wellington Hampshire, [facility] Surgery 06/08/2017, 2:01 PM Pager: [phone] Consults: [phone] Mon-Fri 7:00 am-4:30 pm Sat-Sun 7:00 am-11:30 am

## 2017-06-08 NOTE — Progress Notes (Signed)
Informed Dan A.,RN of CBG reading 429, no new orders

## 2017-06-08 NOTE — Progress Notes (Signed)
Physical Therapy Wound Treatment Patient Details  Name: John Bailey MRN: [id number] Date of Birth: Feb 28, 1971  Today's Date: 06/08/2017 PT Individual Time: 1300-1409 PT Individual Time Calculation (min): 69 min   Subjective  Subjective: Pt very dissapointed to not be going home today due to current wound condition  Patient and Family Stated Goals: heal wound decrease odor Prior Treatments: Surgical I&D 06/04/17  Pain Score: Pt declined any medication prior to treatment. Facial Pain Scale Score of 8/10 with hydrotherapy treatment.  Wound Assessment  Pressure Injury 05/28/17 Unstageable - Full thickness tissue loss in which the base of the ulcer is covered by slough (yellow, tan, gray, green or brown) and/or eschar (tan, brown or black) in the wound bed. Central gluteal fold (Active)  Wound Image   06/02/2017 11:00 AM  Dressing Type ABD;Barrier Film (skin prep);Gauze (Comment);Moist to dry;Other (Comment) 06/08/2017  2:00 PM  Dressing Changed 06/08/2017  2:00 PM  Dressing Change Frequency Daily 06/08/2017  2:00 PM  State of Healing Non-healing 06/08/2017  2:00 PM  Site / Wound Assessment Yellow;Brown 06/08/2017  2:00 PM  % Wound base Red or Granulating 80% 06/08/2017  2:00 PM  % Wound base Yellow/Fibrinous Exudate 20% 06/08/2017  2:00 PM  Peri-wound Assessment Erythema (blanchable);Maceration;Pink;Denuded 06/08/2017  2:00 PM  Wound Length (cm) 11 cm 06/07/2017  1:00 PM  Wound Width (cm) 5.5 cm 06/07/2017  1:00 PM  Wound Depth (cm) 5 cm 06/07/2017  1:00 PM  Wound Surface Area (cm^2) 60.5 cm^2 06/07/2017  1:00 PM  Wound Volume (cm^3) 302.5 cm^3 06/07/2017  1:00 PM  Tunneling (cm) 6 cm at 12 o'clock 06/02/2017 11:00 AM  Margins Unattached edges (unapproximated) 06/08/2017  2:00 PM  Drainage Amount Minimal 06/08/2017  2:00 PM  Drainage Description Odor;Serosanguineous 06/08/2017  2:00 PM  Treatment Cleansed;Debridement (Selective);Packing (Saline gauze) 06/08/2017  2:00 PM   Santyl applied  Wound /  Incision (Open or Dehisced) 05/28/17 Non-pressure wound Sacrum Right;Left;Medial R buttock (Active)  Dressing Type ABD;Barrier Film (skin prep);Gauze (Comment);Other (Comment) 06/08/2017  2:00 PM  Dressing Changed Changed 06/07/2017  2:06 PM  Dressing Status Clean;Dry;Intact 06/08/2017  2:00 PM  Dressing Change Frequency Daily 06/08/2017  2:00 PM  Site / Wound Assessment Yellow;Painful 06/08/2017  2:00 PM  % Wound base Yellow/Fibrinous Exudate 100% 06/08/2017  2:00 PM  Peri-wound Assessment Denuded;Pink 06/08/2017  2:00 PM  Wound Length (cm) 3.5 cm 06/02/2017 11:00 AM  Wound Width (cm) 2.5 cm 06/02/2017 11:00 AM  Wound Surface Area (cm^2) 8.75 cm^2 06/02/2017 11:00 AM  Margins Unattached edges (unapproximated) 06/08/2017  2:00 PM  Closure None 06/08/2017  2:00 PM  Drainage Amount Moderate 06/08/2017  2:00 PM  Drainage Description Purulent;Odor 06/08/2017  2:00 PM  Treatment Cleansed 06/08/2017  2:00 PM      Hydrotherapy Pulsed lavage therapy - wound location: sacrum Pulsed Lavage with Suction (psi): 4 psi(4-12) Pulsed Lavage with Suction - Normal Saline Used: 1000 mL Pulsed Lavage Tip: Tip with splash shield Selective Debridement Selective Debridement - Location: sacrum  Selective Debridement - Tools Used: Forceps;Scissors Selective Debridement - Tissue Removed: brown, gray yellow, necrotic tissue.    Wound Assessment and Plan  Wound Therapy - Assess/Plan/Recommendations Wound Therapy - Clinical Statement: Surgical PA and WOC RN present during treatment both agree that d/c today would not be in pt's best intrest given the amount of foul smelling necrotic tissue still present in wound. Pt likely to be transfered back to acute care to continue hydrotherapy and Dakins solution treatment. for three days.  Factors Delaying/Impairing Wound Healing: Immobility;Multiple medical problems;Diabetes Mellitus Hydrotherapy Plan: Debridement;Patient/family education;Pulsatile lavage with suction Wound Therapy -  Frequency: 6X / week Wound Therapy - Current Recommendations: Surgery consult;WOC nurse Wound Therapy - Follow Up Recommendations: Other (comment) Wound Plan: see above  Wound Therapy Goals- Improve the function of patient's integumentary system by progressing the wound(s) through the phases of wound healing (inflammation - proliferation - remodeling) by: Decrease Necrotic Tissue to: 50 Decrease Necrotic Tissue - Progress: Progressing toward goal Increase Granulation Tissue to: 50 Increase Granulation Tissue - Progress: Progressing toward goal Goals/treatment plan/discharge plan were made with and agreed upon by patient/family: Yes Time For Goal Achievement: 7 days Wound Therapy - Potential for Goals: Fair  Goals will be updated until maximal potential achieved or discharge criteria met.  Discharge criteria: when goals achieved, discharge from hospital, MD decision/surgical intervention, no progress towards goals, refusal/missing three consecutive treatments without notification or medical reason.  GP    Dani Gobble. [name] PT, DPT Acute Rehabilitation  [phone] Pager [phone]   [place] 06/08/2017, 2:30 PM

## 2017-06-08 NOTE — Progress Notes (Signed)
Patient is requesting air mattress bed for sacral wounds. MD notified. Verbal order for air mattress bed. Orders placed. Will continue to monitor.

## 2017-06-08 NOTE — Progress Notes (Signed)
New Admission Note:   Arrival Method: WC Mental Orientation: A&O X4 Telemetry: Not ordered Assessment: Completed Skin: See Flowsheets IV: None Pain: 9/10 Safety Measures: Safety Fall Prevention Plan has been given, discussed and signed Admission: Completed Unit Orientation: Patient has been orientated to the room, unit and staff.  Family: Friend at bedside  Orders have been reviewed and implemented. Will continue to monitor the patient. Call light has been placed within reach and bed alarm has been activated.    Dixie Dials RN, BSN

## 2017-06-08 NOTE — Progress Notes (Signed)
[facility]     PROGRESS NOTE  Subjective/Complaints:  Patient seen lying in bed this morning. He states he slept well overnight. He states he is ready for discharge.  ROS: Denies CP, SOB, N/V/D  Objective: Vital Signs: Blood pressure (!) 165/108, pulse 81, temperature 98.2 F (36.8 C), temperature source Oral, resp. rate 18, height 6' 0.01" (1.829 m), weight 124.9 kg (275 lb 5.7 oz), SpO2 100 %. No results found. No results for input(s): WBC, HGB, HCT, PLT in the last 72 hours. No results for input(s): NA, K, CL, GLUCOSE, BUN, CREATININE, CALCIUM in the last 72 hours.  Invalid input(s): CO CBG (last 3)  Recent Labs    06/07/17 1616 06/07/17 2206 06/08/17 0627  GLUCAP 272* 334* 429*    Wt Readings from Last 3 Encounters:  06/08/17 124.9 kg (275 lb 5.7 oz)  05/13/17 132.1 kg (291 lb 3.6 oz)  04/02/17 (!) 154.9 kg (341 lb 9.6 oz)    Physical Exam:  BP (!) 165/108   Pulse 81   Temp 98.2 F (36.8 C) (Oral)   Resp 18   Ht 6' 0.01" (1.829 m)   Wt 124.9 kg (275 lb 5.7 oz)   SpO2 100%   BMI 37.34 kg/m  Constitutional: No distress . Vital signs reviewed. HENT: Normocephalic, atraumatic Eyes: EOMI, oral membranes moist Cardiovascular: RRR. No JVD    Respiratory: CTA Bilaterally. Normal effort    GI: BS +, non-distended  Musc:  Bouchard's deformities  +Edema b/l LE Neurological: Alert and oriented.  Motor: Right upper extremity 4/5 proximal to distal  Left upper extremity: Shoulder abduction, elbow flexion/extension 4-/5, hand grip 2-/5 (unchanged) Bilateral lower extremities: 4-/5 proximal to distal (slowly improving)  Skin.  Lower extremity ischemic changes.   Assessment/Plan: 1. Functional deficits secondary to debility which require 3+ hours per day of interdisciplinary therapy in a comprehensive inpatient rehab setting. Physiatrist is providing close team supervision and 24 hour management of active medical problems listed  below. Physiatrist and rehab team continue to assess barriers to discharge/monitor patient progress toward functional and medical goals.  Function:  Bathing Bathing position   Position: Wheelchair/chair at sink  Bathing parts Body parts bathed by patient: Right arm, Left arm, Chest, Abdomen, Front perineal area, Buttocks, Right upper leg, Left upper leg Body parts bathed by helper: Back, Left lower leg, Right lower leg  Bathing assist Assist Level: Touching or steadying assistance(Pt > 75%)      Upper Body Dressing/Undressing Upper body dressing   What is the patient wearing?: Pull over shirt/dress     Pull over shirt/dress - Perfomed by patient: Thread/unthread right sleeve, Thread/unthread left sleeve, Put head through opening, Pull shirt over trunk Pull over shirt/dress - Perfomed by helper: Pull shirt over trunk        Upper body assist Assist Level: Supervision or verbal cues      Lower Body Dressing/Undressing Lower body dressing Lower body dressing/undressing activity did not occur: N/A What is the patient wearing?: Shoes, Underwear, Pants Underwear - Performed by patient: Thread/unthread right underwear leg, Thread/unthread left underwear leg, Pull underwear up/down Underwear - Performed by helper: Pull underwear up/down Pants- Performed by patient: Thread/unthread right pants leg, Thread/unthread left pants leg, Pull pants up/down Pants- Performed by helper: Pull pants up/down         Shoes - Performed by patient: Don/doff right shoe, Don/doff left shoe Shoes - Performed by helper: Don/doff right shoe, Don/doff left shoe AFO - Performed by  patient: Don/doff right AFO AFO - Performed by helper: Don/doff right AFO      Lower body assist Assist for lower body dressing: Touching or steadying assistance (Pt > 75%)      Toileting Toileting Toileting activity did not occur: No continent bowel/bladder event Toileting steps completed by patient: Adjust clothing prior  to toileting, Adjust clothing after toileting, Performs perineal hygiene Toileting steps completed by helper: Adjust clothing after toileting, Adjust clothing prior to toileting Toileting Assistive Devices: Grab bar or rail  Toileting assist Assist level: Touching or steadying assistance (Pt.75%)   Transfers Chair/bed transfer Chair/bed transfer activity did not occur: Refused Chair/bed transfer method: Ambulatory, Stand pivot Chair/bed transfer assist level: Supervision or verbal cues Chair/bed transfer assistive device: Armrests, Walker Mechanical lift: Ecologist Ambulation activity did not occur: Refused   Max distance: 200 Assist level: Supervision or verbal cues   Wheelchair Wheelchair activity did not occur: Refused Type: Manual Max wheelchair distance: 150 Assist Level: Supervision or verbal cues  Cognition Comprehension Comprehension assist level: Follows complex conversation/direction with no assist  Expression Expression assist level: Expresses complex ideas: With no assist  Social Interaction Social Interaction assist level: Interacts appropriately 90% of the time - Needs monitoring or encouragement for participation or interaction.  Problem Solving Problem solving assist level: Solves complex problems: With extra time  Memory Memory assist level: Recognizes or recalls 90% of the time/requires cueing < 10% of the time    Medical Problem List and Plan: 1.Debilitysecondary to cardiorenal syndrome, synovitis left hand due to RA   D/c today  Will see patient for transitional care management in 1-2 weeks 2. DVT Prophylaxis/Anticoagulation: Eliquis. Monitor for any bleeding episodes 3. Pain Management:Neurontin 300 mg daily, Robaxin as needed   Voltaren gel added for joint pain.   Neuropathic pain Left hand - cont gabapentin, started on capsasin cream .025%   Lidoderm patch added on 3/5   Controlled on 3/26 4. Mood:Provide emotional support 5.  Neuropsych: This patientiscapable of making decisions on hisown behalf. 6. Skin/Wound Care:Routine skin checks   Sacral decubitus ulcer surgically debrided in OR on 3/22   Will need outpatient follow-up with dressing changes and home health nurse 7. Fluids/Electrolytes/Nutrition:Routine I&O's 8.End-stage renal disease. Hemodialysis initiated Tuesday Thursday Saturday schedule. Patient with left AVFgraft placed 05/10/2017   Appreciate nephro recs. 9.Acute on chronic anemia. Continue Aranesp.    Hb 9.4 on 3/23   Cont to monitor 10.Steroid induced hyperglycemia on Diabetes mellitus with peripheral neuropathy. Latest hemoglobin A1c 9.2. Diabetic teaching   Lantus insulin 25 units daily at bedtime, increased to 30 on 3/8, increased to 35 on 3/14, decreased to 30 given steroid taper and labile readings to avoid hypoglycemia.  Steroids being tapered   NovoLog 3 units 3 times a day, increased to 6 on 3/4, increased to 10 on 3/14.    CBG (last 3)  Recent Labs    06/07/17 1616 06/07/17 2206 06/08/17 0627  GLUCAP 272* 334* 429*    Extremely labile on 3/26 with elevations greater than 400, multifactorial due to steroids and noncompliance 11.Acute on chronic diastolic congestive heart failure. Monitor for any signs of fluid overload. Continue Coreg 6.25mg  BID  Filed Weights   06/07/17 0435 06/08/17 0557 06/08/17 0751  Weight: 125.8 kg (277 lb 5.4 oz) 124.5 kg (274 lb 6.4 oz) 124.9 kg (275 lb 5.7 oz)     Volume management per nephrology.    12.Rheumatoid arthritis. Prednisone 10 mg daily. Patient had been receiving  Abataceptevery Tuesday prior to admission. Added Voltaren gel. Follow-up rheumatologist Dr.Ziokowska   MRI right knee reviewed, showing OA, meniscal tear and patellar tendinosis on right.  Per Ortho, braces PRN   DMARD resumed on 3/5    Started on high-dose 6 week steroid taper on 3/6  13.Atrial fibrillation RVR. Status post cardioversion. Amiodarone decreased to 200 mg daily  3/3 14.Leukocytosis.     Afebrile   WBCs 22.3 on 3/23, likely steroid related   Hand films reviewed, suggesting RA changes   Continue to monitor   Cont to monitor 15.  Left hand numbness   Likely related to fistula placement +/- RA 16. Labile blood pressure   Vitals:   06/08/17 0800 06/08/17 0830  BP: (!) 150/94 (!) 165/108  Pulse: 79 81  Resp:    Temp:    SpO2:      Labile with HD 17. 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 Constipation with gas   Bowel regimen increased on 3/7   Simethicone started on 3/14  >35 minutes spent in total and coordination of care for discharge regarding dialysis, wound care, and other comorbidities  LOS (Days) 26 A FACE TO FACE EVALUATION WAS PERFORMED  Yaire Kreher Lorie Phenix 06/08/2017 8:45 AM

## 2017-06-08 NOTE — Progress Notes (Signed)
Report provided to HD nurse Lenna Sciara, RN) with CBG reading of 429.[name]

## 2017-06-08 NOTE — Progress Notes (Signed)
Gave report to nurse accepting patient to [id number]. Patient is packed and ready to be transported.

## 2017-06-08 NOTE — Progress Notes (Signed)
[facility] ROUNDING NOTE   Subjective:    New ESRD to be placed at [facility]  TTS shift  He continues in therapy  Debility secondary to cardiorenal syndrome and is scheduled for debridement of sacral wound. Has history of rheumatoid arthritis and continues on Prednisone 10 mg daily  He also has atrial fibrillation and is taking amiodarone  He has a fistula placed  Exacerbation of RA improved  Patient with sacral wound that is not healing well enough for discharge  He completed his dialysis treatment today with no issues   Objective:  Vital signs in last 24 hours:  Temp:  [97.8 F (36.6 C)-98.2 F (36.8 C)] 98.1 F (36.7 C) (03/26 1414) Pulse Rate:  [75-93] 86 (03/26 1414) Resp:  [18-20] 18 (03/26 1414) BP: (125-165)/(85-108) 143/86 (03/26 1414) SpO2:  [100 %] 100 % (03/26 1414) Weight:  [268 lb 8.3 oz (121.8 kg)-275 lb 5.7 oz (124.9 kg)] 268 lb 8.3 oz (121.8 kg) (03/26 1206)  Weight change: -2 lb 15 oz (-1.333 kg) Filed Weights   06/08/17 0557 06/08/17 0751 06/08/17 1206  Weight: 274 lb 6.4 oz (124.5 kg) 275 lb 5.7 oz (124.9 kg) 268 lb 8.3 oz (121.8 kg)    Intake/Output: I/O last 3 completed shifts: In: 49 [P.O.:680] Out: 1 [Stool:1]   Intake/Output this shift:  Total I/O In: -  Out: 3000 [Other:3000]  CVS- RRR RS- CTA ABD- BS present soft non-distended EXT-  1 +  Edema    Synovitis bilateral hands   Basic Metabolic Panel: Recent Labs  Lab 06/01/17 1708 06/03/17 1530 06/04/17 1210 06/05/17 0803 06/08/17 0832  NA 134* 134* 140 134* 131*  K 3.6 3.6 2.9* 3.5 4.5  CL 96* 98*  --  98* 96*  CO2 23 23  --  19* 20*  GLUCOSE 111* 269* 67 128* 420*  BUN 128* 110*  --  63* 79*  CREATININE 7.22* 6.37*  --  5.45* 7.15*  CALCIUM 9.2 8.6*  --  8.3* 8.7*  PHOS 4.4 4.6  --  4.2 6.4*    Liver Function Tests: Recent Labs  Lab 06/01/17 1708 06/03/17 1530 06/05/17 0803 06/08/17 0832  ALBUMIN 2.8* 2.5* 2.6* 2.4*   No results for input(s):  LIPASE, AMYLASE in the last 168 hours. No results for input(s): AMMONIA in the last 168 hours.  CBC: Recent Labs  Lab 06/01/17 1708 06/03/17 1047 06/04/17 1210 06/05/17 0803 06/08/17 0833  WBC 23.6* 21.4*  --  22.3* 24.6*  NEUTROABS  --  18.4*  --   --   --   HGB 9.4* 9.6* 9.2* 9.4* 7.9*  HCT 29.2* 30.4* 27.0* 30.2* 25.0*  MCV 86.4 88.1  --  89.9 86.2  PLT 493* 368  --  296 285    Cardiac Enzymes: No results for input(s): CKTOTAL, CKMB, CKMBINDEX, TROPONINI in the last 168 hours.  BNP: Invalid input(s): POCBNP  CBG: Recent Labs  Lab 06/07/17 1210 06/07/17 1616 06/07/17 2206 06/08/17 0627 06/08/17 1258  GLUCAP 307* 272* 334* 429* 178*    Microbiology: Results for orders placed or performed during the hospital encounter of 04/22/17  Respiratory Panel by PCR     Status: None   Collection Time: 04/25/17 10:50 AM  Result Value Ref Range Status   Adenovirus NOT DETECTED NOT DETECTED Final   Coronavirus 229E NOT DETECTED NOT DETECTED Final   Coronavirus HKU1 NOT DETECTED NOT DETECTED Final   Coronavirus NL63 NOT DETECTED NOT DETECTED Final   Coronavirus OC43 NOT  DETECTED NOT DETECTED Final   Metapneumovirus NOT DETECTED NOT DETECTED Final   Rhinovirus / Enterovirus NOT DETECTED NOT DETECTED Final   Influenza A NOT DETECTED NOT DETECTED Final   Influenza A H1 NOT DETECTED NOT DETECTED Final   Influenza A H1 2009 NOT DETECTED NOT DETECTED Final   Influenza A H3 NOT DETECTED NOT DETECTED Final   Influenza B NOT DETECTED NOT DETECTED Final   Parainfluenza Virus 1 NOT DETECTED NOT DETECTED Final   Parainfluenza Virus 2 NOT DETECTED NOT DETECTED Final   Parainfluenza Virus 3 NOT DETECTED NOT DETECTED Final   Parainfluenza Virus 4 NOT DETECTED NOT DETECTED Final   Respiratory Syncytial Virus NOT DETECTED NOT DETECTED Final   Bordetella pertussis NOT DETECTED NOT DETECTED Final   Chlamydophila pneumoniae NOT DETECTED NOT DETECTED Final   Mycoplasma pneumoniae NOT DETECTED  NOT DETECTED Final    Comment: Performed at [facility], [place] [address] [place], [place] [address]  Culture, blood (routine x 2)     Status: None   Collection Time: 04/26/17 12:10 AM  Result Value Ref Range Status   Specimen Description BLOOD LEFT HAND  Final   Special Requests IN PEDIATRIC BOTTLE Blood Culture adequate volume  Final   Culture   Final    NO GROWTH 5 DAYS Performed at [facility], [place] [address] [place], [place] [address]    Report Status 05/01/2017 FINAL  Final  Culture, blood (routine x 2)     Status: None   Collection Time: 04/26/17 12:18 AM  Result Value Ref Range Status   Specimen Description BLOOD LEFT HAND  Final   Special Requests IN PEDIATRIC BOTTLE Blood Culture adequate volume  Final   Culture   Final    NO GROWTH 5 DAYS Performed at [facility], [place] [address], [place], [place] [address]    Report Status 05/01/2017 FINAL  Final  Gastrointestinal Panel by PCR , Stool     Status: None   Collection Time: 05/02/17  2:42 PM  Result Value Ref Range Status   Campylobacter species NOT DETECTED NOT DETECTED Final   Plesimonas shigelloides NOT DETECTED NOT DETECTED Final   Salmonella species NOT DETECTED NOT DETECTED Final   Yersinia enterocolitica NOT DETECTED NOT DETECTED Final   Vibrio species NOT DETECTED NOT DETECTED Final   Vibrio cholerae NOT DETECTED NOT DETECTED Final   Enteroaggregative E coli (EAEC) NOT DETECTED NOT DETECTED Final   Enteropathogenic E coli (EPEC) NOT DETECTED NOT DETECTED Final   Enterotoxigenic E coli (ETEC) NOT DETECTED NOT DETECTED Final   Shiga like toxin producing E coli (STEC) NOT DETECTED NOT DETECTED Final   Shigella/Enteroinvasive E coli (EIEC) NOT DETECTED NOT DETECTED Final   Cryptosporidium NOT DETECTED NOT DETECTED Final   Cyclospora cayetanensis NOT DETECTED NOT DETECTED Final   Entamoeba histolytica NOT DETECTED NOT DETECTED Final   Giardia lamblia NOT DETECTED NOT DETECTED Final   Adenovirus  F40/41 NOT DETECTED NOT DETECTED Final   Astrovirus NOT DETECTED NOT DETECTED Final   Norovirus GI/GII NOT DETECTED NOT DETECTED Final   Rotavirus A NOT DETECTED NOT DETECTED Final   Sapovirus (I, II, IV, and V) NOT DETECTED NOT DETECTED Final    Comment: Performed at [facility], [place] [place],  [address]  C difficile quick scan w PCR reflex     Status: None   Collection Time: 05/02/17  2:42 PM  Result Value Ref Range Status   C  Diff antigen NEGATIVE NEGATIVE Final   C Diff toxin NEGATIVE NEGATIVE Final   C Diff interpretation No C. difficile detected.  Final    Comment: Performed at [facility], [place] [address], [place], [place] [address]  Surgical pcr screen     Status: None   Collection Time: 05/04/17  6:53 AM  Result Value Ref Range Status   MRSA, PCR NEGATIVE NEGATIVE Final   Staphylococcus aureus NEGATIVE NEGATIVE Final    Comment: (NOTE) The Xpert SA Assay (FDA approved for NASAL specimens in patients 24 years of age and older), is one component of a comprehensive surveillance program. It is not intended to diagnose infection nor to guide or monitor treatment. Performed at [facility], [place] [address], [place], [place] [address]     Coagulation Studies: No results for input(s): LABPROT, INR in the last 72 hours.  Urinalysis: No results for input(s): COLORURINE, LABSPEC, PHURINE, GLUCOSEU, HGBUR, BILIRUBINUR, KETONESUR, PROTEINUR, UROBILINOGEN, NITRITE, LEUKOCYTESUR in the last 72 hours.  Invalid input(s): APPERANCEUR    Imaging: No results found.   Medications:    . amiodarone  200 mg Oral Daily  . apixaban  5 mg Oral BID  . calcitRIOL  0.5 mcg Oral Q T,Th,Sat-1800  . carvedilol  6.25 mg Oral BID WC  . gabapentin  300 mg Oral Daily  . insulin aspart  3 Units Subcutaneous TID WC  . insulin glargine  30 Units Subcutaneous QHS  . predniSONE  10 mg Oral 4X daily taper  . 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

## 2017-06-09 DIAGNOSIS — E785 Hyperlipidemia, unspecified: Secondary | ICD-10-CM

## 2017-06-09 LAB — BASIC METABOLIC PANEL
Anion gap: 14 (ref 5–15)
BUN: 45 mg/dL — ABNORMAL HIGH (ref 6–20)
CO2: 22 mmol/L (ref 22–32)
Calcium: 8.6 mg/dL — ABNORMAL LOW (ref 8.9–10.3)
Chloride: 97 mmol/L — ABNORMAL LOW (ref 101–111)
Creatinine, Ser: 4.87 mg/dL — ABNORMAL HIGH (ref 0.61–1.24)
GFR calc Af Amer: 15 mL/min — ABNORMAL LOW (ref >60)
GFR, EST NON AFRICAN AMERICAN: 13 mL/min — AB (ref >60)
Glucose, Bld: 341 mg/dL — ABNORMAL HIGH (ref 65–99)
POTASSIUM: 4.4 mmol/L (ref 3.5–5.1)
Sodium: 133 mmol/L — ABNORMAL LOW (ref 135–145)

## 2017-06-09 LAB — GLUCOSE, CAPILLARY
GLUCOSE-CAPILLARY: 281 mg/dL — AB (ref 65–99)
GLUCOSE-CAPILLARY: 310 mg/dL — AB (ref 65–99)
Glucose-Capillary: 302 mg/dL — ABNORMAL HIGH (ref 65–99)
Glucose-Capillary: 282 mg/dL — ABNORMAL HIGH (ref 65–99)

## 2017-06-09 LAB — CBC
HEMATOCRIT: 26.6 % — AB (ref 39.0–52.0)
Hemoglobin: 8.5 g/dL — ABNORMAL LOW (ref 13.0–17.0)
MCH: 28.4 pg (ref 26.0–34.0)
MCHC: 32 g/dL (ref 30.0–36.0)
MCV: 89 fL (ref 78.0–100.0)
Platelets: 288 10*3/uL (ref 150–400)
RBC: 2.99 MIL/uL — ABNORMAL LOW (ref 4.22–5.81)
RDW: 22.3 % — ABNORMAL HIGH (ref 11.5–15.5)
WBC: 19.6 10*3/uL — ABNORMAL HIGH (ref 4.0–10.5)

## 2017-06-09 MED ORDER — DARBEPOETIN ALFA 150 MCG/0.3ML IJ SOSY
150.0000 ug | PREFILLED_SYRINGE | INTRAMUSCULAR | Status: DC
  Administered 2017-06-10 – 2017-07-01 (×4): 150 ug via INTRAVENOUS
  Filled 2017-06-09 (×4): qty 0.3

## 2017-06-09 MED ORDER — INSULIN ASPART 100 UNIT/ML ~~LOC~~ SOLN
0.0000 [IU] | Freq: Three times a day (TID) | SUBCUTANEOUS | Status: DC
  Administered 2017-06-09: 8 [IU] via SUBCUTANEOUS
  Administered 2017-06-10: 3 [IU] via SUBCUTANEOUS
  Administered 2017-06-11: 8 [IU] via SUBCUTANEOUS
  Administered 2017-06-11 – 2017-06-12 (×2): 5 [IU] via SUBCUTANEOUS
  Administered 2017-06-13: 2 [IU] via SUBCUTANEOUS
  Administered 2017-06-13 (×2): 3 [IU] via SUBCUTANEOUS
  Administered 2017-06-15: 2 [IU] via SUBCUTANEOUS
  Administered 2017-06-15: 3 [IU] via SUBCUTANEOUS
  Administered 2017-06-16 – 2017-06-18 (×5): 2 [IU] via SUBCUTANEOUS
  Administered 2017-06-20 (×2): 5 [IU] via SUBCUTANEOUS
  Administered 2017-06-20: 3 [IU] via SUBCUTANEOUS
  Administered 2017-06-21: 2 [IU] via SUBCUTANEOUS
  Administered 2017-06-21 – 2017-06-23 (×2): 3 [IU] via SUBCUTANEOUS
  Administered 2017-06-23: 2 [IU] via SUBCUTANEOUS
  Administered 2017-06-24 – 2017-06-26 (×3): 3 [IU] via SUBCUTANEOUS
  Administered 2017-06-27 – 2017-06-28 (×4): 2 [IU] via SUBCUTANEOUS
  Administered 2017-07-01: 3 [IU] via SUBCUTANEOUS
  Administered 2017-07-02: 5 [IU] via SUBCUTANEOUS
  Administered 2017-07-02: 3 [IU] via SUBCUTANEOUS
  Administered 2017-07-03 – 2017-07-04 (×2): 2 [IU] via SUBCUTANEOUS

## 2017-06-09 MED ORDER — DAKINS (1/4 STRENGTH) 0.125 % EX SOLN
Freq: Two times a day (BID) | CUTANEOUS | Status: AC
  Administered 2017-06-09 – 2017-06-11 (×6)
  Filled 2017-06-09: qty 473

## 2017-06-09 MED ORDER — INSULIN ASPART 100 UNIT/ML ~~LOC~~ SOLN
0.0000 [IU] | Freq: Every day | SUBCUTANEOUS | Status: DC
  Administered 2017-06-09: 4 [IU] via SUBCUTANEOUS
  Administered 2017-06-10 – 2017-06-22 (×2): 2 [IU] via SUBCUTANEOUS

## 2017-06-09 MED ORDER — INSULIN GLARGINE 100 UNIT/ML ~~LOC~~ SOLN
20.0000 [IU] | Freq: Every day | SUBCUTANEOUS | Status: DC
  Administered 2017-06-09: 20 [IU] via SUBCUTANEOUS
  Filled 2017-06-09: qty 0.2

## 2017-06-09 NOTE — Progress Notes (Signed)
Occupational Therapy Evaluation Patient Details Name: John Bailey MRN: [id number] DOB: [birth year] Today's Date: 06/09/2017    History of Present Illness 47 year old male with history of RA, PVD, hypertension, end-stage renal disease on hemodialysis, A. fib on Eliquis, diabetes and chronic diastolic CHF and sacral and coccyx pressure ulcer who was admitted from 04/22/2017-05/13/2017 with anasarca from acute on chronic diastolic heart failure and A. fib with RVR (cardioverted) and was discharged to inpatient rehab for debility.  He underwent debridement of sacral decubitus ulcer on 06/04/2017 by general surgery.  Pt was supposed to discharge from CIR on 06/09/2017 but general surgery recommended continued inpatient wound care, patient was readmitted on 06/09/2017.   Clinical Impression   This 47 y/o M presents with the above. Pt was most recently receiving therapy services in CIR, completing functional mobility at overall supervision level using platform RW and completing ADLs with MinA. Pt currently completing functional mobility using platform RW with minguard assist, requires MinA for LB ADLs. Pt is motivated and determined to maintain current level of function and to improve with therapy. Will continue to follow acutely and  recommend [place] services after discharge to progress pt's safety and independence with ADLs and mobility.    Follow Up Recommendations  Home health OT;Supervision/Assistance - 24 hour    Equipment Recommendations  None recommended by OT(Pt's DME needs are met )           Precautions / Restrictions Precautions Precautions: None Precaution Comments: Severe RA in shoulders, left hand Restrictions Weight Bearing Restrictions: No      Mobility Bed Mobility               General bed mobility comments: OOB upon arrival   Transfers Overall transfer level: Needs assistance Equipment used: Left platform walker Transfers: Sit to/from Stand Sit to Stand: Min  guard         General transfer comment: increased time/effort to rise; minguard for safety    Balance Overall balance assessment: Needs assistance Sitting-balance support: Feet supported;No upper extremity supported Sitting balance-Leahy Scale: Good     Standing balance support: Bilateral upper extremity supported;During functional activity Standing balance-Leahy Scale: Poor Standing balance comment: reliant on UE support                            ADL either performed or assessed with clinical judgement   ADL Overall ADL's : Needs assistance/impaired Eating/Feeding: Set up;Sitting Eating/Feeding Details (indicate cue type and reason): assist to open containers for drink end of session  Grooming: Set up;Sitting   Upper Body Bathing: Min guard;Sitting   Lower Body Bathing: Min guard;Sit to/from stand   Upper Body Dressing : Min guard;Sitting   Lower Body Dressing: Minimal assistance;Sit to/from stand   Toilet Transfer: Min guard;Ambulation;BSC;RW Toilet Transfer Details (indicate cue type and reason): BSC over toilet; simulated with transfer to/from w/c  Toileting- Clothing Manipulation and Hygiene: Minimal assistance;Sit to/from stand       Functional mobility during ADLs: Min guard(with platform RW ) General ADL Comments: handoff from PT start of session pt just completing hallway level ambulation using platform RW, pt completing with overall minGuard; pt very determined to maintain current level of function     Vision Baseline Vision/History: Wears glasses Wears Glasses: At all times Patient Visual Report: No change from baseline Vision Assessment?: No apparent visual deficits                Pertinent Vitals/Pain  Pain Assessment: No/denies pain     Hand Dominance Right   Extremity/Trunk Assessment Upper Extremity Assessment Upper Extremity Assessment: LUE deficits/detail;RUE deficits/detail RUE Deficits / Details: decreased grip strength and  fine motor noted; pt reports able to hold silverware without use of built up handle; shoulder and elbow ROM appear WFL  LUE Deficits / Details: increased edema noted in L hand; limited digit ROM due to pain from RA; shoulder and elbow ROM appear [facility]    Lower Extremity Assessment Lower Extremity Assessment: Defer to PT evaluation       Communication Communication Communication: No difficulties   Cognition Arousal/Alertness: Awake/alert Behavior During Therapy: WFL for tasks assessed/performed Overall Cognitive Status: Within Functional Limits for tasks assessed                                                      Home Living Family/patient expects to be discharged to:: Private residence Living Arrangements: Spouse/significant other;Children Available Help at Discharge: Family;Available 24 hours/day Type of Home: House Home Access: Level entry           Bathroom Shower/Tub: Occupational psychologist: Standard     Home Equipment: Cane - single point;Other (comment);Bedside commode;Adaptive equipment;Wheelchair - manual(platform RW)          Prior Functioning/Environment Level of Independence: Needs assistance  Gait / Transfers Assistance Needed: pt was at supervision level with ambulation using platform RW at time of d/c from CIR ADL's / Homemaking Assistance Needed: overall MinA level at time of d/c from CIR, using AE to assist with LB ADLs             OT Problem List: Decreased strength;Decreased range of motion;Decreased activity tolerance;Impaired balance (sitting and/or standing);Decreased coordination      OT Treatment/Interventions: Self-care/ADL training;Therapeutic exercise;Energy conservation;DME and/or AE instruction;Therapeutic activities;Patient/family education;Balance training;Neuromuscular education    OT Goals(Current goals can be found in the care plan section) Acute Rehab OT Goals Patient Stated Goal: to maintain  current level of function and not to decline  OT Goal Formulation: With patient Time For Goal Achievement: 06/23/17 Potential to Achieve Goals: Good  OT Frequency: Min 2X/week                             AM-PAC PT "6 Clicks" Daily Activity     Outcome Measure Help from another person eating meals?: A Little Help from another person taking care of personal grooming?: A Little Help from another person toileting, which includes using toliet, bedpan, or urinal?: A Little Help from another person bathing (including washing, rinsing, drying)?: A Little Help from another person to put on and taking off regular upper body clothing?: None Help from another person to put on and taking off regular lower body clothing?: A Little 6 Click Score: 19   End of Session Equipment Utilized During Treatment: Other (comment)(platform RW ) Nurse Communication: Mobility status  Activity Tolerance: Patient tolerated treatment well Patient left: in chair;with call bell/phone within reach;with family/visitor present  OT Visit Diagnosis: Muscle weakness (generalized) (M62.81);Other abnormalities of gait and mobility (R26.89)                Time: 7209-4709 OT Time Calculation (min): 18 min Charges:  OT General Charges $OT Visit: 1 Visit OT Evaluation $OT  Eval Moderate Complexity: 1 Mod G-Codes:     Lou Cal, OT Pager [phone] 06/09/2017  [name] 06/09/2017, 4:35 PM

## 2017-06-09 NOTE — Evaluation (Signed)
Physical Therapy Evaluation Patient Details Name: John Bailey MRN: [id number] DOB: 1971-02-07 Today's Date: 06/09/2017   History of Present Illness  47 year old male with history of RA, PVD, hypertension, end-stage renal disease on hemodialysis, A. fib on Eliquis, diabetes and chronic diastolic CHF and sacral and coccyx pressure ulcer who was admitted from 04/22/2017-05/13/2017 with anasarca from acute on chronic diastolic heart failure and A. fib with RVR (cardioverted) and was discharged to inpatient rehab for debility.  He underwent debridement of sacral decubitus ulcer on 06/04/2017 by general surgery.  Pt was supposed to discharge from CIR on 06/09/2017 but general surgery recommended continued inpatient wound care, patient was readmitted on 06/09/2017.  Clinical Impression  Pt has made good progress in CIR and is currently min guard for bed mobility, transfers and ambulation of 600 feet with R platform walker. Pt is very motivated and continues to work towards his independent level of function before hospitalization. PT recommends HHPT at d/c. PT will continue to follow acutely until d/c.     Follow Up Recommendations Home health PT;Supervision for mobility/OOB    Equipment Recommendations  Other (comment)(equipment already ordered from CIR d/c)    Recommendations for Other Services       Precautions / Restrictions Precautions Precautions: None Precaution Comments: Severe RA in shoulders, left hand Required Braces or Orthoses: Other Brace/Splint Other Brace/Splint: R ankle brace Restrictions Weight Bearing Restrictions: No      Mobility  Bed Mobility Overal bed mobility: Needs Assistance       Supine to sit: Min guard     General bed mobility comments: min guard for safety, increased time and effort  Transfers Overall transfer level: Needs assistance Equipment used: Left platform walker Transfers: Sit to/from Stand Sit to Stand: Min guard         General transfer  comment: increased time/effort to rise; min guard for safety  Ambulation/Gait Ambulation/Gait assistance: Min guard Ambulation Distance (Feet): 600 Feet Assistive device: Rolling walker (2 wheeled) Gait Pattern/deviations: Decreased dorsiflexion - right;Decreased dorsiflexion - left;Decreased step length - right;Decreased step length - left;Step-through pattern;Shuffle Gait velocity: slowed Gait velocity interpretation: Below normal speed for age/gender General Gait Details: min guard for safety, slow steady gait, vc for looking up and out      Balance Overall balance assessment: Needs assistance Sitting-balance support: Feet supported;No upper extremity supported Sitting balance-Leahy Scale: Good     Standing balance support: Bilateral upper extremity supported;During functional activity Standing balance-Leahy Scale: Poor Standing balance comment: reliant on UE support                              Pertinent Vitals/Pain Pain Assessment: No/denies pain    Home Living Family/patient expects to be discharged to:: Private residence Living Arrangements: Spouse/significant other;Children Available Help at Discharge: Family;Available 24 hours/day Type of Home: House Home Access: Level entry       Home Equipment: Cane - single point;Other (comment);Bedside commode;Adaptive equipment;Wheelchair - manual(platform RW)      Prior Function Level of Independence: Needs assistance   Gait / Transfers Assistance Needed: pt was at supervision level with ambulation using platform RW at time of d/c from CIR  ADL's / Homemaking Assistance Needed: overall MinA level at time of d/c from CIR, using AE to assist with LB ADLs         Hand Dominance   Dominant Hand: Right    Extremity/Trunk Assessment   Upper Extremity Assessment Upper Extremity  Assessment: Defer to OT evaluation RUE Deficits / Details: decreased grip strength and fine motor noted; pt reports able to hold  silverware without use of built up handle; shoulder and elbow ROM appear WFL  LUE Deficits / Details: increased edema noted in L hand; limited digit ROM due to pain from RA; shoulder and elbow ROM appear [facility]     Lower Extremity Assessment Lower Extremity Assessment: RLE deficits/detail;LLE deficits/detail RLE Deficits / Details: strength grossly 4/5, ankle lacks dorsiflexion, remaining LE ROM WFL RLE Sensation: history of peripheral neuropathy LLE Deficits / Details: strength grossly 4/5, ankle lacks dorsiflexion, remaining LE ROM WFL LLE Sensation: history of peripheral neuropathy       Communication   Communication: No difficulties  Cognition Arousal/Alertness: Awake/alert Behavior During Therapy: WFL for tasks assessed/performed Overall Cognitive Status: Within Functional Limits for tasks assessed                                               Assessment/Plan    PT Assessment Patient needs continued PT services  PT Problem List Decreased strength;Decreased range of motion;Decreased mobility       PT Treatment Interventions DME instruction;Gait training;Functional mobility training;Therapeutic activities;Therapeutic exercise;Balance training;Patient/family education    PT Goals (Current goals can be found in the Care Plan section)  Acute Rehab PT Goals Patient Stated Goal: to maintain current level of function and not to decline  PT Goal Formulation: With patient Time For Goal Achievement: 06/23/17 Potential to Achieve Goals: Good    Frequency Min 3X/week    AM-PAC PT "6 Clicks" Daily Activity  Outcome Measure Difficulty turning over in bed (including adjusting bedclothes, sheets and blankets)?: A Lot Difficulty moving from lying on back to sitting on the side of the bed? : A Lot Difficulty sitting down on and standing up from a chair with arms (e.g., wheelchair, bedside commode, etc,.)?: A Lot Help needed moving to and from a bed to chair (including  a wheelchair)?: A Little Help needed walking in hospital room?: A Little Help needed climbing 3-5 steps with a railing? : Total 6 Click Score: 13    End of Session Equipment Utilized During Treatment: Gait belt Activity Tolerance: Patient tolerated treatment well Patient left: in chair;Other (comment)(OT in room for therapy) Nurse Communication: Mobility status PT Visit Diagnosis: Other abnormalities of gait and mobility (R26.89);Muscle weakness (generalized) (M62.81)    Time: 7622-6333 PT Time Calculation (min) (ACUTE ONLY): 25 min   Charges:   PT Evaluation $PT Eval Moderate Complexity: 1 Mod PT Treatments $Gait Training: 8-22 mins   PT G Codes:        Mayra Jolliffe B. [name] PT, DPT Acute Rehabilitation  [phone] Pager [phone]    [place] 06/09/2017, 5:41 PM

## 2017-06-09 NOTE — Progress Notes (Signed)
Inpatient Diabetes Program Recommendations  AACE/ADA: New Consensus Statement on Inpatient Glycemic Control (2015)  Target Ranges:  Prepandial:   less than 140 mg/dL      Peak postprandial:   less than 180 mg/dL (1-2 hours)      Critically ill patients:  140 - 180 mg/dL   Lab Results  Component Value Date   GLUCAP 281 (H) 06/09/2017   HGBA1C 6.9 (H) 06/08/2017    Review of Glycemic Control Results for John Bailey, John Bailey" (MRN [id number]) as of 06/09/2017 09:16  Ref. Range 06/08/2017 06:27 06/08/2017 12:58 06/08/2017 16:27 06/08/2017 20:27 06/09/2017 07:37  Glucose-Capillary Latest Ref Range: 65 - 99 mg/dL 429 (H) 178 (H) 338 (H) 190 (H) 281 (H)   Diabetes history: Type 2 DM Outpatient Diabetes medications: Novolog 3 units TID, Lantus 30 units QHS Current orders for Inpatient glycemic control: Novolog 0-9 units TID, Prednisone 20 mg BID  Inpatient Diabetes Program Recommendations:    Noted Prednisone beginning to taper tomorrow 3/28. Patient recently hospitalized for debridement and was on Novolog 0-15 units + HS, and Lantus 25 units QHS.  In the setting of steroids, considering home regimen and AM FSBG, please consider starting Lantus 20 units QHS, Novolog 0-15 units TID + HS. As steroids are tapered, may need adjustments.   Thanks, Bronson Curb, MSN, RNC-OB Diabetes Coordinator [phone] (8a-5p)

## 2017-06-09 NOTE — Progress Notes (Signed)
[facility] Progress Note   New ESRD to be placed at [facility]  TTS shift  He continues in therapy  Debility secondary to cardiorenal syndrome and is scheduled for debridement of sacral wound. Has history of rheumatoid arthritis and continues on Prednisone 10 mg daily  He also has atrial fibrillation and is taking amiodarone  He has a fistula placed  Exacerbation of RA improved  Patient with sacral wound that is not healing well enough for discharge    Assessment/ Plan:    ESRD- TTS dialysis schedule  - HD tomorrow -> slowly challenge EDW.  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   Objective:   BP 133/82 (BP Location: Right Arm)   Pulse 88   Temp 98.2 F (36.8 C) (Oral)   Resp 18   SpO2 98%   Intake/Output Summary (Last 24 hours) at 06/09/2017 1647 Last data filed at 06/09/2017 0600 Gross per 24 hour  Intake 360 ml  Output 0 ml  Net 360 ml   Weight change:   Physical Exam: GEN - NAD CVS- RRR RS- CTA ABD- BS present soft non-distended EXT-  1 +  Edema    Synovitis bilateral hands    Imaging: No results found.  Labs: BMET Recent Labs  Lab 06/03/17 1530 06/04/17 1210 06/05/17 0803 06/08/17 0832 06/09/17 0604  NA 134* 140 134* 131* 133*  K 3.6 2.9* 3.5 4.5 4.4  CL 98*  --  98* 96* 97*  CO2 23  --  19* 20* 22  GLUCOSE 269* 67 128* 420* 341*  BUN 110*  --  63* 79* 45*  CREATININE 6.37*  --  5.45* 7.15* 4.87*  CALCIUM 8.6*  --  8.3* 8.7* 8.6*  PHOS 4.6  --  4.2 6.4*  --    CBC Recent Labs  Lab 06/03/17 1047 06/04/17 1210 06/05/17 0803 06/08/17 0833 06/09/17 0604  WBC 21.4*  --  22.3* 24.6* 19.6*  NEUTROABS 18.4*  --   --   --   --   HGB 9.6* 9.2*  9.4* 7.9* 8.5*  HCT 30.4* 27.0* 30.2* 25.0* 26.6*  MCV 88.1  --  89.9 86.2 89.0  PLT 368  --  296 285 288    Medications:    . amiodarone  200 mg Oral Daily  . apixaban  5 mg Oral BID  . calcitRIOL  0.5 mcg Oral Q T,Th,Sat-1800  . carvedilol  6.25 mg Oral BID WC  . [START ON 06/10/2017] darbepoetin (ARANESP) injection - DIALYSIS  150 mcg Intravenous Q Thu-HD  . gabapentin  300 mg Oral Daily  . insulin aspart  0-15 Units Subcutaneous TID WC  . insulin aspart  0-5 Units Subcutaneous QHS  . insulin glargine  20 Units Subcutaneous QHS  . predniSONE  20 mg Oral BID WC   Followed by  . [START ON 06/10/2017] predniSONE  10 mg Oral TID WC   Followed by  . [START ON 06/11/2017] predniSONE  10 mg Oral BID WC   Followed by  . [START ON 06/12/2017] predniSONE  10 mg Oral Q breakfast  . sodium hypochlorite   Irrigation BID  Otelia Santee, MD 06/09/2017, 4:47 PM

## 2017-06-09 NOTE — Progress Notes (Signed)
Physical Therapy Wound Treatment Patient Details  Name: John Bailey MRN: [id number] Date of Birth: 11-08-1970  Today's Date: 06/09/2017 Time: 4920-1007 Time Calculation (min): 35 min  Subjective  Subjective: Pt concerned about sitting up without Rojo pad for pressure relief Patient and Family Stated Goals: heal wound decrease odor Prior Treatments: Surgical I&D 06/04/17  Pain Score:  Pt refused medication prior to treatment. Pt facial expression indicate 4/10 pain on Facial Pain Score.  Wound Assessment  Pressure Injury 05/28/17 Unstageable - Full thickness tissue loss in which the base of the ulcer is covered by slough (yellow, tan, gray, green or brown) and/or eschar (tan, brown or black) in the wound bed. Central gluteal fold (Active)  Dressing Type ABD;Barrier Film (skin prep);Gauze (Comment);Moist to dry;Other (Comment) 06/09/2017  5:00 PM  Dressing Changed 06/09/2017  5:00 PM  Dressing Change Frequency Daily 06/09/2017  5:00 PM  State of Healing Non-healing 06/09/2017  5:00 PM  Site / Wound Assessment Yellow;Brown;Black 06/09/2017  5:00 PM  % Wound base Red or Granulating 70% 06/09/2017  5:00 PM  % Wound base Yellow/Fibrinous Exudate 30% 06/09/2017  5:00 PM  Peri-wound Assessment Erythema (blanchable);Maceration;Pink;Denuded 06/09/2017  5:00 PM  Wound Length (cm) 11 cm 06/09/2017  5:00 PM  Wound Width (cm) 5.5 cm 06/09/2017  5:00 PM  Wound Depth (cm) 5 cm 06/09/2017  5:00 PM  Wound Surface Area (cm^2) 60.5 cm^2 06/09/2017  5:00 PM  Wound Volume (cm^3) 302.5 cm^3 06/09/2017  5:00 PM  Tunneling (cm) 6 cm at 12 o'clock 06/02/2017 11:00 AM  Margins Unattached edges (unapproximated) 06/09/2017  5:00 PM  Drainage Amount Moderate 06/09/2017  5:00 PM  Drainage Description Odor;Serosanguineous 06/09/2017  5:00 PM  Treatment Cleansed;Debridement (Selective);Hydrotherapy (Pulse lavage);Other (Comment) 06/09/2017  5:00 PM   Dakins Solution soaked Kerlix  Wound / Incision (Open or Dehisced) 05/28/17  Non-pressure wound Sacrum Right;Left;Medial R buttock (Active)  Dressing Type ABD;Barrier Film (skin prep);Gauze (Comment);Other (Comment) 06/09/2017  5:00 PM  Dressing Changed Changed 06/07/2017  2:06 PM  Dressing Status Clean;Dry;Intact 06/09/2017  5:00 PM  Dressing Change Frequency Daily 06/09/2017  5:00 PM  Site / Wound Assessment Yellow;Painful 06/09/2017  5:00 PM  % Wound base Yellow/Fibrinous Exudate 100% 06/09/2017  5:00 PM  Peri-wound Assessment Denuded;Pink 06/09/2017  5:00 PM  Wound Length (cm) 3.5 cm 06/09/2017  5:00 PM  Wound Width (cm) 2.5 cm 06/09/2017  5:00 PM  Wound Surface Area (cm^2) 8.75 cm^2 06/09/2017  5:00 PM  Margins Unattached edges (unapproximated) 06/09/2017  5:00 PM  Closure None 06/09/2017  5:00 PM  Drainage Amount Moderate 06/09/2017  5:00 PM  Drainage Description Purulent;Odor;Green 06/09/2017  5:00 PM  Treatment Cleansed 06/09/2017  5:00 PM   Dakins Solution soaked Kerlix    Hydrotherapy Pulsed lavage therapy - wound location: sacrum Pulsed Lavage with Suction (psi): 4 psi(4-12) Pulsed Lavage with Suction - Normal Saline Used: 1000 mL Pulsed Lavage Tip: Tip with splash shield Selective Debridement Selective Debridement - Location: sacrum  Selective Debridement - Tools Used: Forceps;Scissors Selective Debridement - Tissue Removed: brown, gray yellow, necrotic tissue.    Wound Assessment and Plan  Wound Therapy - Assess/Plan/Recommendations Wound Therapy - Clinical Statement: Wound exudate is now yellow and green, base of wound continues to have black gray film when dressing removed, improved by pulsed lavage. Three day course of Dakins solution added to dressing changes to combat infection. Factors Delaying/Impairing Wound Healing: Immobility;Multiple medical problems;Diabetes Mellitus Hydrotherapy Plan: Debridement;Patient/family education;Pulsatile lavage with suction Wound Therapy - Frequency: 6X / week  Wound Therapy - Current Recommendations: Surgery consult;WOC  nurse Wound Therapy - Follow Up Recommendations: Other (comment) Wound Plan: see above  Wound Therapy Goals- Improve the function of patient's integumentary system by progressing the wound(s) through the phases of wound healing (inflammation - proliferation - remodeling) by: Decrease Necrotic Tissue to: 50 Decrease Necrotic Tissue - Progress: Progressing toward goal Increase Granulation Tissue to: 50 Increase Granulation Tissue - Progress: Progressing toward goal Goals/treatment plan/discharge plan were made with and agreed upon by patient/family: Yes Time For Goal Achievement: 7 days Wound Therapy - Potential for Goals: Fair  Goals will be updated until maximal potential achieved or discharge criteria met.  Discharge criteria: when goals achieved, discharge from hospital, MD decision/surgical intervention, no progress towards goals, refusal/missing three consecutive treatments without notification or medical reason.  GP    Dani Gobble. [name] PT, DPT Acute Rehabilitation  [phone] Pager [phone]  Lester [name] PT, DPT Acute Rehabilitation  [phone] Pager [phone] [place] 06/09/2017, 5:23 PM

## 2017-06-09 NOTE — Consult Note (Signed)
[facility] Nurse wound consult note Reason for Consult: Sacral wound Patient is well known to the [facility] service.  He was transferred back to inpatient acute care from inpatient rehab for continued wound treatment therapies.  PT will continue hydrotherapy and Dakin's solution.  PT is reaching out to the medical team to get orders for care. Thank you for the consult.   Val Riles, RN, MSN, CWOCN, CNS-BC, pager [phone]

## 2017-06-09 NOTE — Progress Notes (Signed)
Post bath given,patient ambulated on the the entire hallway with his walker.No shortness of breathing,not feeling dizzy and with a steady gait.Encouraged to sit up at the bedside or in recliner.

## 2017-06-09 NOTE — Progress Notes (Addendum)
   CC:  Sacral decubitus  Subjective: Up getting cleaned up, feels OK. About to undergo hydrotherapy.  Objective: Vital signs in last 24 hours: Temp:  [97.5 F (36.4 C)-98.5 F (36.9 C)] 98 F (36.7 C) (03/27 0606) Pulse Rate:  [75-93] 75 (03/27 0606) Resp:  [18] 18 (03/27 0606) BP: (107-147)/(70-95) 110/75 (03/27 0606) SpO2:  [98 %-100 %] 100 % (03/27 0606) Weight:  [121.8 kg (268 lb 8.3 oz)] 121.8 kg (268 lb 8.3 oz) (03/26 1206) Last BM Date: 06/08/17  360 Po recorded tx from Rehab last PM Afebrile, VSS Glucose 341 Creatinine 4.87   Intake/Output from previous day: 03/26 0701 - 03/27 0700 In: 360 [P.O.:360] Out: 0  Intake/Output this shift: No intake/output data recorded.  General appearance: alert, cooperative and no distress HEENT: EOM's intact, pupils equal and round Pulm: effort normal GU:     Lab Results:  Recent Labs    06/08/17 0833 06/09/17 0604  WBC 24.6* 19.6*  HGB 7.9* 8.5*  HCT 25.0* 26.6*  PLT 285 288    BMET Recent Labs    06/08/17 0832 06/09/17 0604  NA 131* 133*  K 4.5 4.4  CL 96* 97*  CO2 20* 22  GLUCOSE 420* 341*  BUN 79* 45*  CREATININE 7.15* 4.87*  CALCIUM 8.7* 8.6*   PT/INR No results for input(s): LABPROT, INR in the last 72 hours.  Recent Labs  Lab 06/03/17 1530 06/05/17 0803 06/08/17 0832  ALBUMIN 2.5* 2.6* 2.4*     Lipase  No results found for: LIPASE   Medications: . amiodarone  200 mg Oral Daily  . apixaban  5 mg Oral BID  . calcitRIOL  0.5 mcg Oral Q T,Th,Sat-1800  . carvedilol  6.25 mg Oral BID WC  . gabapentin  300 mg Oral Daily  . insulin aspart  0-9 Units Subcutaneous TID WC  . predniSONE  20 mg Oral BID WC   Followed by  . [START ON 06/10/2017] predniSONE  10 mg Oral TID WC   Followed by  . [START ON 06/11/2017] predniSONE  10 mg Oral BID WC   Followed by  . [START ON 06/12/2017] predniSONE  10 mg Oral Q breakfast  . sodium hypochlorite   Irrigation BID   Anti-infectives (From admission,  onward)   None       Assessment/Plan AF/DVT - on Eliquis DCHF/NICM ESRD HD - TTS Diabetes ID - peripheral neuropathy Hypertension RA - on steroids Deconditioning- cardiorenal syndrome   Sacral ulcer S/pDEBRIDMENT OF SACRAL DECUBITUS ULCER [id number] Dr. Grandville Silos -POD 5 - surgical path: DENSELY INFLAMED AND NECROTIC FIBROADIPOSE TISSUE, THERE IS NO EVIDENCE OF MALIGNANCY - afebrile  ID -ancef perioperative FEN -renal diet VTE -eliquis Foley -none Follow up: Wound clinic  Plan:  Continue BID wet to dry dressing changes, 3 total days of Dakins. continue hydrotherapy.    LOS: 1 day    JENNINGS,WILLARD 06/09/2017 [id number]   Wound examined.  Hopefully this will clean up with bedside care.  Been to avoid further debridement in OR.  Edsel Petrin. Dalbert Batman, M.D., [facility] General and Minimally invasive Surgery Breast and Colorectal Surgery Office:   [phone]

## 2017-06-09 NOTE — Progress Notes (Signed)
Patient ID: John Bailey, male   DOB: 22-Feb-1971, 47 y.o.   MRN: [id number]  PROGRESS NOTE    John Bailey  WFU:[id number] DOB: 10/01/1970 DOA: 06/08/2017 PCP: [name], MD   Brief Narrative:  47 year old male with history of RA, PVD, hypertension, end-stage renal disease on hemodialysis, A. fib on Eliquis, diabetes and chronic diastolic CHF and sacral and coccyx pressure ulcer who was admitted from 04/22/2017-05/13/2017 with anasarca from acute on chronic diastolic heart failure and A. fib with RVR (cardioverted) and was discharged to inpatient rehab for debility.  He underwent debridement of sacral decubitus ulcer on 06/04/2017 by general surgery.  He was supposed to be discharged from [place] on 06/09/2017 but general surgery recommended continued inpatient wound care so patient was readmitted on 06/09/2017.   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 Grandville Silos on 06/04/2017.  General surgery following.  General surgery is recommending continued inpatient wound care including hydrotherapy.  ESRD on HD  -Nephrology following.  Dialysis as per nephrology schedule  Type II Diabetes  -Start Levemir 20 units subcu at bedtime and Accu-Cheks with insulin sliding scale coverage.  Diabetes coordinator  following.   Leukocytosis, likely reactive, related to underlying sacral wound and steroids in the setting of RA   -Repeat a.m. labs  Anemia of chronic disease  -Hemoglobin stable.  Monitor.  No signs of bleeding.  Atrial Fibrillation CHA2DS2-VASc score 2-3 , on anticoagulation with Eliquis. Echo 04/25/17  EF 20-25, normal systolic  -Status post recent cardioversion during last hospitalization.  Currently rate controlled Continue amiodarone, Coreg and Eliquis   Chronic diastolic heart failure.  T -Recent hospitalization for acute decompensated heart failure. -Currently compensated and volume is managed by dialysis.  Continue Coreg.  Outpatient follow-up with cardiology  Rheumatoid arthritis with flare Continue 6-week prednisone taper which was started on 05/19/2017. outpatient follow-up with rheumatology.  Patient had been receiving abatacept every Tuesday prior to admission.  Deconditioning due to cardiorenal syndrome, the patient is now with increased strength, with the help of inpatient rehab Continue PT and OT  DVT prophylaxis:  Eliquis  Code Status:    Full  Family Communication:   None at bedside Disposition Plan: Probable home once clinically improved and cleared by general surgery  Consultants: General surgery/nephrology  Procedures: None  Antimicrobials: None   Subjective: Patient seen and examined at bedside.  He denies overnight fever, nausea or vomiting.  Objective: Vitals:   06/08/17 1653 06/08/17 2029 06/09/17 0606 06/09/17 1000  BP: 134/83 107/70 110/75 (!) 143/90  Pulse: 84 78 75 90  Resp: 18 18 18 18   Temp: (!) 97.5 F (36.4 C) 98.5 F (36.9 C) 98 F (36.7 C) 98.6 F (37 C)  TempSrc: Oral Oral Oral Oral  SpO2: 98% 100% 100% 100%    Intake/Output Summary (Last 24 hours) at 06/09/2017 1335 Last data filed  at 06/09/2017 0600 Gross per 24 hour  Intake 360 ml  Output 0 ml  Net 360 ml   There were no vitals filed for this  visit.  Examination:  General exam: Appears calm and comfortable.  No acute distress Respiratory system: Bilateral decreased breath sound at bases Cardiovascular system: S1 & S2 heard, rate controlled  gastrointestinal system: Abdomen is obese, nondistended, soft and nontender. Normal bowel sounds heard. Extremities: No cyanosis, clubbing; trace edema    Data Reviewed: I have personally reviewed following labs and imaging studies  CBC: Recent Labs  Lab 06/03/17 1047 06/04/17 1210 06/05/17 0803 06/08/17 0833 06/09/17 0604  WBC 21.4*  --  22.3* 24.6* 19.6*  NEUTROABS 18.4*  --   --   --   --   HGB 9.6* 9.2* 9.4* 7.9* 8.5*  HCT 30.4* 27.0* 30.2* 25.0* 26.6*  MCV 88.1  --  89.9 86.2 89.0  PLT 368  --  296 285 737   Basic Metabolic Panel: Recent Labs  Lab 06/03/17 1530 06/04/17 1210 06/05/17 0803 06/08/17 0832 06/09/17 0604  NA 134* 140 134* 131* 133*  K 3.6 2.9* 3.5 4.5 4.4  CL 98*  --  98* 96* 97*  CO2 23  --  19* 20* 22  GLUCOSE 269* 67 128* 420* 341*  BUN 110*  --  63* 79* 45*  CREATININE 6.37*  --  5.45* 7.15* 4.87*  CALCIUM 8.6*  --  8.3* 8.7* 8.6*  PHOS 4.6  --  4.2 6.4*  --    GFR: Estimated Creatinine Clearance: 26.1 mL/min (A) (by C-G formula based on SCr of 4.87 mg/dL (H)). Liver Function Tests: Recent Labs  Lab 06/03/17 1530 06/05/17 0803 06/08/17 0832  ALBUMIN 2.5* 2.6* 2.4*   No results for input(s): LIPASE, AMYLASE in the last 168 hours. No results for input(s): AMMONIA in the last 168 hours. Coagulation Profile: No results for input(s): INR, PROTIME in the last 168 hours. Cardiac Enzymes: No results for input(s): CKTOTAL, CKMB, CKMBINDEX, TROPONINI in the last 168 hours. BNP (last 3 results) No results for input(s): PROBNP in the last 8760 hours. HbA1C: Recent Labs    06/08/17 0700  HGBA1C 6.9*   CBG: Recent Labs  Lab 06/08/17 0627 06/08/17 1258 06/08/17 1627 06/08/17 2027 06/09/17 0737  GLUCAP 429* 178* 338* 190* 281*   Lipid  Profile: No results for input(s): CHOL, HDL, LDLCALC, TRIG, CHOLHDL, LDLDIRECT in the last 72 hours. Thyroid Function Tests: No results for input(s): TSH, T4TOTAL, FREET4, T3FREE, THYROIDAB in the last 72 hours. Anemia Panel: No results for input(s): VITAMINB12, FOLATE, FERRITIN, TIBC, IRON, RETICCTPCT in the last 72 hours. Sepsis Labs: No results for input(s): PROCALCITON, LATICACIDVEN in the last 168 hours.  Recent Results (from the past 240 hour(s))  MRSA PCR Screening     Status: None   Collection Time: 06/08/17  6:37 PM  Result Value Ref Range Status   MRSA by PCR NEGATIVE NEGATIVE Final    Comment:        The GeneXpert MRSA Assay (FDA approved for NASAL specimens only), is one component of a comprehensive MRSA colonization surveillance program. It is not intended to diagnose MRSA infection nor to guide or monitor treatment for MRSA infections. Performed at [facility], [place] [address], [place], [place] [address]          Radiology Studies: No results found.      Scheduled Meds: . amiodarone  200 mg Oral Daily  . apixaban  5 mg  Oral BID  . calcitRIOL  0.5 mcg Oral Q T,Th,Sat-1800  . carvedilol  6.25 mg Oral BID WC  . [START ON 06/10/2017] darbepoetin (ARANESP) injection - DIALYSIS  150 mcg Intravenous Q Thu-HD  . gabapentin  300 mg Oral Daily  . insulin aspart  0-9 Units Subcutaneous TID WC  . predniSONE  20 mg Oral BID WC   Followed by  . [START ON 06/10/2017] predniSONE  10 mg Oral TID WC   Followed by  . [START ON 06/11/2017] predniSONE  10 mg Oral BID WC   Followed by  . [START ON 06/12/2017] predniSONE  10 mg Oral Q breakfast  . sodium hypochlorite   Irrigation BID   Continuous Infusions:   LOS: 1 day        Aline August, MD [facility] Pager [phone]  If 7PM-7AM, please contact night-coverage [email or web address] Password [email or web address] 06/09/2017, 1:35 PM

## 2017-06-10 DIAGNOSIS — E1129 Type 2 diabetes mellitus with other diabetic kidney complication: Secondary | ICD-10-CM

## 2017-06-10 DIAGNOSIS — I10 Essential (primary) hypertension: Secondary | ICD-10-CM

## 2017-06-10 LAB — CBC WITH DIFFERENTIAL/PLATELET
BASOS ABS: 0 10*3/uL (ref 0.0–0.1)
Basophils Relative: 0 %
EOS ABS: 0 10*3/uL (ref 0.0–0.7)
Eosinophils Relative: 0 %
HCT: 24.8 % — ABNORMAL LOW (ref 39.0–52.0)
HEMOGLOBIN: 7.9 g/dL — AB (ref 13.0–17.0)
LYMPHS PCT: 7 %
Lymphs Abs: 1.4 10*3/uL (ref 0.7–4.0)
MCH: 28.3 pg (ref 26.0–34.0)
MCHC: 31.9 g/dL (ref 30.0–36.0)
MCV: 88.9 fL (ref 78.0–100.0)
MONO ABS: 1.4 10*3/uL — AB (ref 0.1–1.0)
Monocytes Relative: 7 %
NEUTROS PCT: 86 %
Neutro Abs: 17.7 10*3/uL — ABNORMAL HIGH (ref 1.7–7.7)
PLATELETS: 286 10*3/uL (ref 150–400)
RBC: 2.79 MIL/uL — ABNORMAL LOW (ref 4.22–5.81)
RDW: 22.1 % — ABNORMAL HIGH (ref 11.5–15.5)
WBC: 20.5 10*3/uL — AB (ref 4.0–10.5)

## 2017-06-10 LAB — BASIC METABOLIC PANEL
Anion gap: 12 (ref 5–15)
BUN: 65 mg/dL — ABNORMAL HIGH (ref 6–20)
CHLORIDE: 99 mmol/L — AB (ref 101–111)
CO2: 22 mmol/L (ref 22–32)
CREATININE: 6.3 mg/dL — AB (ref 0.61–1.24)
Calcium: 8.4 mg/dL — ABNORMAL LOW (ref 8.9–10.3)
GFR, EST AFRICAN AMERICAN: 11 mL/min — AB (ref >60)
GFR, EST NON AFRICAN AMERICAN: 10 mL/min — AB (ref >60)
Glucose, Bld: 344 mg/dL — ABNORMAL HIGH (ref 65–99)
POTASSIUM: 4.8 mmol/L (ref 3.5–5.1)
Sodium: 133 mmol/L — ABNORMAL LOW (ref 135–145)

## 2017-06-10 LAB — MAGNESIUM: Magnesium: 1.7 mg/dL (ref 1.7–2.4)

## 2017-06-10 LAB — GLUCOSE, CAPILLARY
GLUCOSE-CAPILLARY: 109 mg/dL — AB (ref 65–99)
GLUCOSE-CAPILLARY: 211 mg/dL — AB (ref 65–99)
Glucose-Capillary: 200 mg/dL — ABNORMAL HIGH (ref 65–99)

## 2017-06-10 MED ORDER — INSULIN GLARGINE 100 UNIT/ML ~~LOC~~ SOLN
25.0000 [IU] | Freq: Every day | SUBCUTANEOUS | Status: DC
  Administered 2017-06-10: 25 [IU] via SUBCUTANEOUS
  Filled 2017-06-10: qty 0.25

## 2017-06-10 MED ORDER — DARBEPOETIN ALFA 150 MCG/0.3ML IJ SOSY
PREFILLED_SYRINGE | INTRAMUSCULAR | Status: AC
  Filled 2017-06-10: qty 0.3

## 2017-06-10 MED ORDER — ABATACEPT 125 MG/ML ~~LOC~~ SOAJ
125.0000 mg | Freq: Once | SUBCUTANEOUS | Status: AC
  Administered 2017-06-10: 125 mg via SUBCUTANEOUS
  Filled 2017-06-10: qty 1

## 2017-06-10 MED ORDER — INSULIN ASPART 100 UNIT/ML ~~LOC~~ SOLN
5.0000 [IU] | Freq: Three times a day (TID) | SUBCUTANEOUS | Status: DC
  Administered 2017-06-10 – 2017-06-27 (×27): 5 [IU] via SUBCUTANEOUS

## 2017-06-10 NOTE — Progress Notes (Signed)
[facility] Progress Note   New ESRD to be placed at [facility] TTS shift He continues in therapy  Debility secondary to cardiorenal syndrome and is scheduled for debridement of sacral wound. Has history of rheumatoid arthritis and continues on Prednisone 10 mg daily  He also has atrial fibrillation and is taking amiodarone  He has a fistula placed  Exacerbation of RA improvedPatient with sacral wound that is not healing well enough for discharge    Assessment/ Plan:    ESRD- TTS dialysis schedule  - HD today -> slowly challenge EDW.  Seen on HD today. 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   Objective:   BP (!) 159/100   Pulse 85   Temp 98.3 F (36.8 C) (Oral)   Resp (!) 22   Wt 113.9 kg (251 lb)   SpO2 100%   BMI 34.03 kg/m   Intake/Output Summary (Last 24 hours) at 06/10/2017 0935 Last data filed at 06/10/2017 0601 Gross per 24 hour  Intake 0 ml  Output 0 ml  Net 0 ml   Weight change:   Physical Exam: GEN - NAD CVS- RRR RS- CTA ABD- BS present soft non-distended EXT- 1 + Edema Synovitis bilateral hands    Imaging: No results found.  Labs: BMET Recent Labs  Lab 06/03/17 1530 06/04/17 1210 06/05/17 0803 06/08/17 0832 06/09/17 0604 06/10/17 0508  NA 134* 140 134* 131* 133* 133*  K 3.6 2.9* 3.5 4.5 4.4 4.8  CL 98*  --  98* 96* 97* 99*  CO2 23  --  19* 20* 22 22  GLUCOSE 269* 67 128* 420* 341* 344*  BUN 110*  --  63* 79* 45* 65*  CREATININE 6.37*  --  5.45* 7.15* 4.87* 6.30*  CALCIUM 8.6*  --  8.3* 8.7* 8.6* 8.4*  PHOS 4.6  --  4.2 6.4*  --   --     CBC Recent Labs  Lab 06/03/17 1047  06/05/17 0803 06/08/17 0833 06/09/17 0604 06/10/17 0508  WBC 21.4*  --  22.3* 24.6* 19.6* 20.5*  NEUTROABS 18.4*  --   --   --   --  17.7*  HGB 9.6*   < > 9.4* 7.9* 8.5* 7.9*  HCT 30.4*   < > 30.2* 25.0* 26.6* 24.8*  MCV 88.1  --  89.9 86.2 89.0 88.9  PLT 368  --  296 285 288 286   < > = values in this interval not displayed.    Medications:    . amiodarone  200 mg Oral Daily  . apixaban  5 mg Oral BID  . calcitRIOL  0.5 mcg Oral Q T,Th,Sat-1800  . carvedilol  6.25 mg Oral BID WC  . darbepoetin (ARANESP) injection - DIALYSIS  150 mcg Intravenous Q Thu-HD  . gabapentin  300 mg Oral Daily  . insulin aspart  0-15 Units Subcutaneous TID WC  . insulin aspart  0-5 Units Subcutaneous QHS  . insulin aspart  5 Units Subcutaneous TID WC  . insulin glargine  25 Units Subcutaneous QHS  . predniSONE  10 mg Oral TID WC   Followed by  . [  START ON 06/11/2017] predniSONE  10 mg Oral BID WC   Followed by  . [START ON 06/12/2017] predniSONE  10 mg Oral Q breakfast  . sodium hypochlorite   Irrigation BID      Otelia Santee, MD 06/10/2017, 9:35 AM

## 2017-06-10 NOTE — Care Management Note (Addendum)
Case Management Note  Patient Details  Name: John Bailey MRN: [id number] Date of Birth: [birth year]  Subjective/Objective:   History of ESRD on HD, A. fib on Eliquis, diabetes, Rheumatoid Arthritis &chronic diastolic CHF and sacral and coccyx pressure ulcer was admitted from 04/22/2017-05/13/2017 w/ anasarca from acute on chronic diastolic heart failure and A. fib with RVR (cardioverted) and was discharged to inpatient rehab for debility; admitted for  Unstageable Sacral ulcer status post debridement;Debridement was done by Dr. Grandville Silos on 06/04/2017.  Action/Plan: Supposed to be d/c from CIR on 06/09/2017 but general surgery recommended continued inpatient wound care so patient was readmitted on 06/08/2017.General surgery is recommending continued inpatient wound care including hydrotherapy.  Nephrology following. In to speak with patient, discussed recommendations for Home Health PT/OT/RN, offered patient choice and he selected [facility]. Referral called to [facility] with [facility] & not accepted.  Referral offered to [facility] with [facility] and not accepted. NCM advised patient unable to obtain services for patient w/  With [facility] or [facility].  Offered additional choice re: Home Health, patient states he does not have a preference.   Referral for [place] called to [facility]; spoke with Morene Rankins fax [phone]. NCM faxed face sheet, H&P to [facility]; Per Tillie Rung w/ [facility], unable to take patient due to staffing issues.   Faxed face sheet, H&P to [facility]: [phone]. Home UUV:OZDG platform walker,L compression glove; R ankle brace.Cane - single point, Bedside commode;Adaptive equipment;Wheelchair - manual. NCM will continue to follow for discharge transition needs.  Expected Discharge Date:    To Be Determined              Expected Discharge Plan:  [facility] Discharge planning Services  CM Consult Post Acute  Care Choice:  Home Health Choice offered to:  Patient HH Arranged:  PT, RN, OT HH Agency:  [facility] Status of Service:  In process, will continue to follow  Kristen Cardinal, RN  Nurse case [place] 06/10/2017, 1:52 PM

## 2017-06-10 NOTE — Plan of Care (Signed)
  Problem: Education: Goal: Knowledge of General Education information will improve Outcome: Progressing   Problem: Health Behavior/Discharge Planning: Goal: Ability to manage health-related needs will improve Outcome: Progressing   Problem: Clinical Measurements: Goal: Ability to maintain clinical measurements within normal limits will improve Outcome: Progressing Goal: Will remain free from infection Outcome: Progressing Goal: Diagnostic test results will improve Outcome: Progressing Goal: Respiratory complications will improve Outcome: Progressing Goal: Cardiovascular complication will be avoided Outcome: Progressing   Problem: Activity: Goal: Risk for activity intolerance will decrease Outcome: Progressing   Problem: Nutrition: Goal: Adequate nutrition will be maintained Outcome: Progressing   Problem: Coping: Goal: Level of anxiety will decrease Outcome: Progressing   Problem: Elimination: Goal: Will not experience complications related to bowel motility Outcome: Progressing Goal: Will not experience complications related to urinary retention Outcome: Progressing   Problem: Pain Managment: Goal: General experience of comfort will improve Outcome: Progressing   Problem: Safety: Goal: Ability to remain free from injury will improve Outcome: Progressing   Problem: Skin Integrity: Goal: Risk for impaired skin integrity will decrease Outcome: Progressing   Problem: Education: Goal: Knowledge of disease and its progression will improve Outcome: Progressing   Problem: Fluid Volume: Goal: Compliance with measures to maintain balanced fluid volume will improve Outcome: Progressing   Problem: Health Behavior/Discharge Planning: Goal: Ability to manage health-related needs will improve Outcome: Progressing   Problem: Nutritional: Goal: Ability to make healthy dietary choices will improve Outcome: Progressing   Problem: Physical Regulation: Goal:  Complications related to the disease process, condition or treatment will be avoided or minimized Outcome: Progressing

## 2017-06-10 NOTE — Progress Notes (Signed)
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 Grandville Silos -POD6 - surgical path:DENSELY INFLAMED AND NECROTIC FIBROADIPOSE TISSUE,THERE IS NO EVIDENCE OF MALIGNANCY - afebrile  ID -ancef perioperative FEN -renal diet VTE -eliquis Foley -none Follow up: Wound clinic  Plan:  Continue BID wet to dry dressing changes. continue hydrotherapy.    LOS: 2 days    Subjective: CC; sacral wound  Pt undergoing hydrotherapy.  Objective: Vital signs in last 24 hours: Temp:  [98 F (36.7 C)-98.3 F (36.8 C)] 98.3 F (36.8 C) (03/28 1121) Pulse Rate:  [82-90] 85 (03/28 1121) Resp:  [18-22] 19 (03/28 1121) BP: (133-171)/(78-107) 148/87 (03/28 1121) SpO2:  [98 %-100 %] 100 % (03/28 1121) Weight:  [243 lb 2.7 oz (110.3 kg)-268 lb 8.4 oz (121.8 kg)] 243 lb 2.7 oz (110.3 kg) (03/28 1121) Last BM Date: 06/09/17  Intake/Output from previous day: No intake/output data recorded. Intake/Output this shift: Total I/O In: -  Out: 3500 [Other:3500]  PE: Gen:  Alert, NAD GU: see photo of sacral wound below        Anti-infectives: Anti-infectives (From admission, onward)   None      Lab Results:  Recent Labs    06/09/17 0604 06/10/17 0508  WBC 19.6* 20.5*  HGB 8.5* 7.9*  HCT 26.6* 24.8*  PLT 288 286   BMET Recent Labs    06/09/17 0604 06/10/17 0508  NA 133* 133*  K 4.4 4.8  CL 97* 99*  CO2 22 22  GLUCOSE 341* 344*  BUN 45* 65*  CREATININE 4.87* 6.30*  CALCIUM 8.6* 8.4*   PT/INR No results for input(s): LABPROT, INR in the last 72 hours. CMP     Component Value Date/Time   NA 133 (L) 06/10/2017 0508   K 4.8 06/10/2017 0508   CL 99 (L) 06/10/2017 0508   CO2 22 06/10/2017 0508   GLUCOSE 344 (H)  06/10/2017 0508   BUN 65 (H) 06/10/2017 0508   CREATININE 6.30 (H) 06/10/2017 0508   CALCIUM 8.4 (L) 06/10/2017 0508   PROT 5.9 (L) 05/05/2017 0430   ALBUMIN 2.4 (L) 06/08/2017 0832   AST 12 (L) 05/05/2017 0430   ALT 12 (L) 05/22/2017 1822   ALKPHOS 74 05/05/2017 0430   BILITOT 0.6 05/05/2017 0430   GFRNONAA 10 (L) 06/10/2017 0508   GFRAA 11 (L) 06/10/2017 0508   Lipase  No results found for: LIPASE  Studies/Results: No results found.    Kalman Drape , [facility] 06/10/2017, 2:56 PM  Pager: [phone] Mon-Wed, Friday 7:00am-4:30pm Thurs 7am-11:30am  Consults: [phone]

## 2017-06-10 NOTE — Progress Notes (Addendum)
Patient ID: John Bailey, male   DOB: 21-Sep-1970, 47 y.o.   MRN: [id number]  PROGRESS NOTE    John Bailey  PNT:[id number] DOB: 1971-01-17 DOA: 06/08/2017 PCP: [name], MD   Brief Narrative:  47 year old male with history of RA, PVD, hypertension, end-stage renal disease on hemodialysis, A. fib on Eliquis, diabetes and chronic diastolic CHF and sacral and coccyx pressure ulcer who was admitted from 04/22/2017-05/13/2017 with anasarca from acute on chronic diastolic heart failure and A. fib with RVR (cardioverted) and was discharged to inpatient rehab for debility.  He underwent debridement of sacral decubitus ulcer on 06/04/2017 by general surgery.  He was supposed to be discharged from [place] on 06/09/2017 but general surgery recommended continued inpatient wound care so patient was readmitted on 06/09/2017.   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 Grandville Silos on 06/04/2017.  General surgery following.  General surgery is recommending continued inpatient wound care including hydrotherapy.  ESRD on HD  -Nephrology following.  Dialysis as per nephrology schedule  Hypertension -Blood pressure on the higher side.  Blood pressure remains elevated after dialysis, consider increasing Coreg or starting another  antihypertensive  Type II Diabetes  -Blood sugars on the higher side.  Increase Levemir to 25 units subcu at bedtime.  Add NovoLog with meals.  Continue sliding scale insulin coverage.   Leukocytosis, likely reactive, related to underlying sacral wound and steroids in the setting of RA   -Repeat a.m. labs.  Monitor off antibiotics  Anemia of chronic disease  -Hemoglobin stable.  Monitor.  No signs of bleeding.  Atrial Fibrillation CHA2DS2-VASc score 2-3 , on anticoagulation with Eliquis. Echo 04/25/17  EF 08-67, normal systolic  -Status post recent cardioversion during last hospitalization.  Currently rate controlled Continue amiodarone, Coreg and Eliquis   Chronic diastolic heart failure.  T -Recent hospitalization for acute decompensated heart failure. -Currently compensated and volume is managed by dialysis.  Continue Coreg.  Outpatient follow-up with cardiology  Rheumatoid arthritis with flare Continue 6-week prednisone taper which was started on 05/19/2017. outpatient follow-up with rheumatology.  Patient had been receiving abatacept every Tuesday prior to admission.  Deconditioning due to cardiorenal syndrome, -Continue PT/OT  DVT prophylaxis:  Eliquis  Code Status:    Full  Family Communication:   None at bedside Disposition Plan: Probable home once clinically improved and cleared by general surgery  Consultants: General surgery/nephrology  Procedures: None  Antimicrobials: None   Subjective: Patient seen and examined at bedside undergoing dialysis.  He denies any new complaints.  No overnight fever, chest pain, shortness of breath or vomiting. Objective: Vitals:   06/10/17 0721 06/10/17 0726 06/10/17 0830 06/10/17 0900  BP: (!) 161/103 (!) 161/99 (!) 171/106 (!) 159/100  Pulse: 88 90 88 85  Resp: 20 18 (!) 21 (!) 22  Temp:      TempSrc:  SpO2:      Weight:        Intake/Output Summary (Last 24 hours) at 06/10/2017 1031 Last data filed at 06/10/2017  0601 Gross per 24 hour  Intake 0 ml  Output 0 ml  Net 0 ml   Filed Weights   06/09/17 2030 06/10/17 0500 06/10/17 0710  Weight: 121.8 kg (268 lb 8.4 oz) 121.8 kg (268 lb 8.3 oz) 113.9 kg (251 lb)    Examination:  General exam: No acute distress.   Respiratory system: Bilateral decreased breath sounds at bases Cardiovascular system: Rate controlled, S1-S2 heard  gastrointestinal system: Abdomen is obese, nondistended, soft and nontender. Normal bowel sounds heard. Extremities: No cyanosis, clubbing; trace edema    Data Reviewed: I have personally reviewed following labs and imaging studies  CBC: Recent Labs  Lab 06/03/17 1047 06/04/17 1210 06/05/17 0803 06/08/17 0833 06/09/17 0604 06/10/17 0508  WBC 21.4*  --  22.3* 24.6* 19.6* 20.5*  NEUTROABS 18.4*  --   --   --   --  17.7*  HGB 9.6* 9.2* 9.4* 7.9* 8.5* 7.9*  HCT 30.4* 27.0* 30.2* 25.0* 26.6* 24.8*  MCV 88.1  --  89.9 86.2 89.0 88.9  PLT 368  --  296 285 288 008   Basic Metabolic Panel: Recent Labs  Lab 06/03/17 1530 06/04/17 1210 06/05/17 0803 06/08/17 0832 06/09/17 0604 06/10/17 0508  NA 134* 140 134* 131* 133* 133*  K 3.6 2.9* 3.5 4.5 4.4 4.8  CL 98*  --  98* 96* 97* 99*  CO2 23  --  19* 20* 22 22  GLUCOSE 269* 67 128* 420* 341* 344*  BUN 110*  --  63* 79* 45* 65*  CREATININE 6.37*  --  5.45* 7.15* 4.87* 6.30*  CALCIUM 8.6*  --  8.3* 8.7* 8.6* 8.4*  MG  --   --   --   --   --  1.7  PHOS 4.6  --  4.2 6.4*  --   --    GFR: Estimated Creatinine Clearance: 19.5 mL/min (A) (by C-G formula based on SCr of 6.3 mg/dL (H)). Liver Function Tests: Recent Labs  Lab 06/03/17 1530 06/05/17 0803 06/08/17 0832  ALBUMIN 2.5* 2.6* 2.4*   No results for input(s): LIPASE, AMYLASE in the last 168 hours. No results for input(s): AMMONIA in the last 168 hours. Coagulation Profile: No results for input(s): INR, PROTIME in the last 168 hours. Cardiac Enzymes: No results for input(s): CKTOTAL, CKMB, CKMBINDEX,  TROPONINI in the last 168 hours. BNP (last 3 results) No results for input(s): PROBNP in the last 8760 hours. HbA1C: Recent Labs    06/08/17 0700  HGBA1C 6.9*   CBG: Recent Labs  Lab 06/08/17 2027 06/09/17 0737 06/09/17 1243 06/09/17 1655 06/09/17 2239  GLUCAP 190* 281* 302* 282* 310*   Lipid Profile: No results for input(s): CHOL, HDL, LDLCALC, TRIG, CHOLHDL, LDLDIRECT in the last 72 hours. Thyroid Function Tests: No results for input(s): TSH, T4TOTAL, FREET4, T3FREE, THYROIDAB in the last 72 hours. Anemia Panel: No results for input(s): VITAMINB12, FOLATE, FERRITIN, TIBC, IRON, RETICCTPCT in the last 72 hours. Sepsis Labs: No results for input(s): PROCALCITON, LATICACIDVEN in the last 168 hours.  Recent Results (from the past 240 hour(s))  MRSA PCR Screening     Status: None   Collection Time: 06/08/17  6:37 PM  Result Value Ref Range Status   MRSA by PCR NEGATIVE NEGATIVE Final    Comment:        The GeneXpert MRSA  Assay (FDA approved for NASAL specimens only), is one component of a comprehensive MRSA colonization surveillance program. It is not intended to diagnose MRSA infection nor to guide or monitor treatment for MRSA infections. Performed at [facility], [place] [address], [place], [place] [address]          Radiology Studies: No results found.      Scheduled Meds: . amiodarone  200 mg Oral Daily  . apixaban  5 mg Oral BID  . calcitRIOL  0.5 mcg Oral Q T,Th,Sat-1800  . carvedilol  6.25 mg Oral BID WC  . darbepoetin (ARANESP) injection - DIALYSIS  150 mcg Intravenous Q Thu-HD  . gabapentin  300 mg Oral Daily  . insulin aspart  0-15 Units Subcutaneous TID WC  . insulin aspart  0-5 Units Subcutaneous QHS  . insulin aspart  5 Units Subcutaneous TID WC  . insulin glargine  25 Units Subcutaneous QHS  . predniSONE  10 mg Oral TID WC   Followed by  . [START ON 06/11/2017] predniSONE  10 mg Oral BID WC   Followed by  . [START ON 06/12/2017]  predniSONE  10 mg Oral Q breakfast  . sodium hypochlorite   Irrigation BID   Continuous Infusions:   LOS: 2 days        Aline August, MD [facility] Pager [phone]  If 7PM-7AM, please contact night-coverage [email or web address] Password [facility] 06/10/2017, 10:31 AM

## 2017-06-10 NOTE — Progress Notes (Addendum)
Physical Therapy Wound Treatment Patient Details  Name: John Bailey MRN: [id number] Date of Birth: June 19, 1970  Today's Date: 06/10/2017 Time: 9735-3299 Time Calculation (min): 50 min  Subjective  Subjective: Pt very lethargic after HD this morning, having difficulty assisting with mobility to position in bed for wound care Patient and Family Stated Goals: heal wound decrease odor Prior Treatments: Surgical I&D 06/04/17  Pain Score: Pt premedicated with Tylenol prior to treatment. Pt experiencing 9/10 pain with pulsed lavage and debridement. Pt educated as wound starts to heal and there are more nerve cells exposed treatments will be more painful and encouraged him to approach pain medication with that in mind.   Wound Assessment  Pressure Injury 05/28/17 Unstageable - Full thickness tissue loss in which the base of the ulcer is covered by slough (yellow, tan, gray, green or brown) and/or eschar (tan, brown or black) in the wound bed. Central gluteal fold (Active)  Dressing Type ABD;Barrier Film (skin prep);Gauze (Comment);Moist to dry;Other (Comment) 06/10/2017  4:00 PM  Dressing Changed 06/10/2017  4:00 PM  Dressing Change Frequency Daily 06/10/2017  4:00 PM  State of Healing Early/partial granulation 06/10/2017  4:00 PM  Site / Wound Assessment Yellow;Brown;Black;Bleeding;Red 06/10/2017  4:00 PM  % Wound base Red or Granulating 70% 06/10/2017  4:00 PM  % Wound base Yellow/Fibrinous Exudate 30% 06/10/2017  4:00 PM  Peri-wound Assessment Erythema (blanchable);Maceration;Pink;Denuded 06/10/2017  4:00 PM  Wound Length (cm) 11 cm 06/09/2017  5:00 PM  Wound Width (cm) 5.5 cm 06/09/2017  5:00 PM  Wound Depth (cm) 5 cm 06/09/2017  5:00 PM  Wound Surface Area (cm^2) 60.5 cm^2 06/09/2017  5:00 PM  Wound Volume (cm^3) 302.5 cm^3 06/09/2017  5:00 PM  Tunneling (cm) 6 cm at 12 o'clock 06/02/2017 11:00 AM  Margins Unattached edges (unapproximated) 06/10/2017  4:00 PM  Drainage Amount Moderate 06/10/2017  4:00  PM  Drainage Description Odor;Serosanguineous;Sanguineous 06/10/2017  4:00 PM  Treatment Cleansed;Debridement (Selective);Hydrotherapy (Pulse lavage) 06/10/2017  4:00 PM   Dakins soaked Kerlix used to dress wound.  Wound / Incision (Open or Dehisced) 05/28/17 Non-pressure wound Sacrum Right;Left;Medial R buttock (Active)  Dressing Type ABD;Barrier Film (skin prep);Other (Comment) 06/10/2017  4:00 PM  Dressing Changed Changed 06/07/2017  2:06 PM  Dressing Status Clean;Dry;Intact 06/10/2017  4:00 PM  Dressing Change Frequency Daily 06/10/2017  4:00 PM  Site / Wound Assessment Yellow;Painful 06/10/2017  4:00 PM  % Wound base Red or Granulating 10% 06/10/2017  4:00 PM  % Wound base Yellow/Fibrinous Exudate 90% 06/10/2017  4:00 PM  Peri-wound Assessment Denuded;Pink 06/10/2017  4:00 PM  Wound Length (cm) 3.5 cm 06/09/2017  5:00 PM  Wound Width (cm) 2.5 cm 06/09/2017  5:00 PM  Wound Surface Area (cm^2) 8.75 cm^2 06/09/2017  5:00 PM  Margins Unattached edges (unapproximated) 06/10/2017  4:00 PM  Closure None 06/10/2017  4:00 PM  Drainage Amount Moderate 06/10/2017  4:00 PM  Drainage Description Purulent;Odor;Green 06/10/2017  4:00 PM  Treatment Cleansed 06/09/2017  5:00 PM      Hydrotherapy Pulsed lavage therapy - wound location: sacrum Pulsed Lavage with Suction (psi): 4 psi(4-12) Pulsed Lavage with Suction - Normal Saline Used: 1000 mL Pulsed Lavage Tip: Tip with splash shield Selective Debridement Selective Debridement - Location: sacrum  Selective Debridement - Tools Used: Forceps;Scissors Selective Debridement - Tissue Removed: brown, gray yellow, necrotic tissue.    Wound Assessment and Plan  Wound Therapy - Assess/Plan/Recommendations Wound Therapy - Clinical Statement: Wound exudate continues to be yellow and green however there  is less green today. Packing continues to have foul odor with removal. Wound however i Factors Delaying/Impairing Wound Healing: Immobility;Multiple medical  problems;Diabetes Mellitus Hydrotherapy Plan: Debridement;Patient/family education;Pulsatile lavage with suction Wound Therapy - Frequency: 6X / week Wound Therapy - Current Recommendations: Surgery consult;WOC nurse Wound Therapy - Follow Up Recommendations: Other (comment) Wound Plan: see above  Wound Therapy Goals- Improve the function of patient's integumentary system by progressing the wound(s) through the phases of wound healing (inflammation - proliferation - remodeling) by: Decrease Necrotic Tissue to: 50 Decrease Necrotic Tissue - Progress: Progressing toward goal Increase Granulation Tissue to: 50 Increase Granulation Tissue - Progress: Progressing toward goal Goals/treatment plan/discharge plan were made with and agreed upon by patient/family: Yes Time For Goal Achievement: 7 days Wound Therapy - Potential for Goals: Fair  Goals will be updated until maximal potential achieved or discharge criteria met.  Discharge criteria: when goals achieved, discharge from hospital, MD decision/surgical intervention, no progress towards goals, refusal/missing three consecutive treatments without notification or medical reason.  GP    Dani Gobble. [name] PT, DPT Acute Rehabilitation  [phone] Pager [phone]   [place] 06/10/2017, 4:42 PM

## 2017-06-11 LAB — CBC WITH DIFFERENTIAL/PLATELET
BASOS ABS: 0 10*3/uL (ref 0.0–0.1)
BASOS PCT: 0 %
Eosinophils Absolute: 0 10*3/uL (ref 0.0–0.7)
Eosinophils Relative: 0 %
HEMATOCRIT: 26 % — AB (ref 39.0–52.0)
Hemoglobin: 8 g/dL — ABNORMAL LOW (ref 13.0–17.0)
LYMPHS ABS: 1.8 10*3/uL (ref 0.7–4.0)
Lymphocytes Relative: 7 %
MCH: 27.3 pg (ref 26.0–34.0)
MCHC: 30.8 g/dL (ref 30.0–36.0)
MCV: 88.7 fL (ref 78.0–100.0)
MONOS PCT: 4 %
Monocytes Absolute: 1 10*3/uL (ref 0.1–1.0)
NEUTROS ABS: 23.2 10*3/uL — AB (ref 1.7–7.7)
Neutrophils Relative %: 89 %
Platelets: 297 10*3/uL (ref 150–400)
RBC: 2.93 MIL/uL — ABNORMAL LOW (ref 4.22–5.81)
RDW: 22 % — AB (ref 11.5–15.5)
WBC: 26 10*3/uL — ABNORMAL HIGH (ref 4.0–10.5)

## 2017-06-11 LAB — BASIC METABOLIC PANEL
Anion gap: 11 (ref 5–15)
BUN: 38 mg/dL — AB (ref 6–20)
CALCIUM: 8.3 mg/dL — AB (ref 8.9–10.3)
CHLORIDE: 96 mmol/L — AB (ref 101–111)
CO2: 25 mmol/L (ref 22–32)
CREATININE: 4.76 mg/dL — AB (ref 0.61–1.24)
GFR calc non Af Amer: 14 mL/min — ABNORMAL LOW (ref >60)
GFR, EST AFRICAN AMERICAN: 16 mL/min — AB (ref >60)
GLUCOSE: 323 mg/dL — AB (ref 65–99)
Potassium: 3.9 mmol/L (ref 3.5–5.1)
Sodium: 132 mmol/L — ABNORMAL LOW (ref 135–145)

## 2017-06-11 LAB — GLUCOSE, CAPILLARY
GLUCOSE-CAPILLARY: 275 mg/dL — AB (ref 65–99)
GLUCOSE-CAPILLARY: 89 mg/dL (ref 65–99)
Glucose-Capillary: 278 mg/dL — ABNORMAL HIGH (ref 65–99)
Glucose-Capillary: 194 mg/dL — ABNORMAL HIGH (ref 65–99)

## 2017-06-11 LAB — MAGNESIUM: Magnesium: 1.7 mg/dL (ref 1.7–2.4)

## 2017-06-11 MED ORDER — INSULIN GLARGINE 100 UNIT/ML ~~LOC~~ SOLN
30.0000 [IU] | Freq: Every day | SUBCUTANEOUS | Status: DC
  Administered 2017-06-11: 30 [IU] via SUBCUTANEOUS
  Filled 2017-06-11: qty 0.3

## 2017-06-11 NOTE — Progress Notes (Signed)
  [facility] Progress Note     Subjective: CC- sacral wound Patient hoping that he will be discharged home soon. Continues to have some pain at sacrum, but feels that it is slightly improving. Tolerating dressing changes well. About to undergo hydrotherapy.  Objective: Vital signs in last 24 hours: Temp:  [98.4 F (36.9 C)-99.8 F (37.7 C)] 99.8 F (37.7 C) (03/29 0840) Pulse Rate:  [83-98] 92 (03/29 0840) Resp:  [18-20] 18 (03/29 0840) BP: (105-111)/(55-66) 110/55 (03/29 0840) SpO2:  [95 %-99 %] 95 % (03/29 0840) Weight:  [242 lb 15.2 oz (110.2 kg)] 242 lb 15.2 oz (110.2 kg) (03/29 0225) Last BM Date: 06/09/17  Intake/Output from previous day: 03/28 0701 - 03/29 0700 In: 600 [P.O.:600] Out: 3500  Intake/Output this shift: No intake/output data recorded.  PE: Gen:  Alert, NAD HEENT: EOM's intact, pupils equal and round Pulm:  effort normal GU:     Lab Results:  Recent Labs    06/10/17 0508 06/11/17 0437  WBC 20.5* 26.0*  HGB 7.9* 8.0*  HCT 24.8* 26.0*  PLT 286 297   BMET Recent Labs    06/10/17 0508 06/11/17 0437  NA 133* 132*  K 4.8 3.9  CL 99* 96*  CO2 22 25  GLUCOSE 344* 323*  BUN 65* 38*  CREATININE 6.30* 4.76*  CALCIUM 8.4* 8.3*   PT/INR No results for input(s): LABPROT, INR in the last 72 hours. CMP     Component Value Date/Time   NA 132 (L) 06/11/2017 0437   K 3.9 06/11/2017 0437   CL 96 (L) 06/11/2017 0437   CO2 25 06/11/2017 0437   GLUCOSE 323 (H) 06/11/2017 0437   BUN 38 (H) 06/11/2017 0437   CREATININE 4.76 (H) 06/11/2017 0437   CALCIUM 8.3 (L) 06/11/2017 0437   PROT 5.9 (L) 05/05/2017 0430   ALBUMIN 2.4 (L) 06/08/2017 0832   AST 12 (L) 05/05/2017 0430   ALT 12 (L) 05/22/2017 1822   ALKPHOS 74 05/05/2017 0430   BILITOT 0.6 05/05/2017 0430   GFRNONAA 14 (L) 06/11/2017 0437   GFRAA 16 (L) 06/11/2017 0437   Lipase  No results found for: LIPASE     Studies/Results: No results  found.  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 Grandville Silos -POD7 - surgical path:DENSELY INFLAMED AND NECROTIC FIBROADIPOSE TISSUE,THERE IS NO EVIDENCE OF MALIGNANCY - TMAX 99.8  ID -ancef perioperative FEN -renal diet VTE -eliquis Foley -none Follow up: Wound clinic  Plan:Odor and green drainage from sacral wound improved, but he does still have some slough/necrotic tissue at the base of his wound. Continue BID wet to dry dressing changes; today is the last day of dakins. Continue hydrotherapy. May need further surgical debridement if hydrotherapy does not clean up the base of the wound.   LOS: 3 days    [place]ellington [place] , [facility] 06/11/2017, 11:21 AM Pager: [phone] Consults: [phone] Mon-Fri 7:00 am-4:30 pm Sat-Sun 7:00 am-11:30 am

## 2017-06-11 NOTE — Progress Notes (Signed)
Patient ID: John Bailey, male   DOB: January 08, 1971, 47 y.o.   MRN: [id number]  PROGRESS NOTE    John Bailey  QXI:[id number] DOB: March 17, 1970 DOA: 06/08/2017 PCP: [name], MD   Brief Narrative:  47 year old male with history of RA, PVD, hypertension, end-stage renal disease on hemodialysis, A. fib on Eliquis, diabetes and chronic diastolic CHF and sacral and coccyx pressure ulcer who was admitted from 04/22/2017-05/13/2017 with anasarca from acute on chronic diastolic heart failure and A. fib with RVR (cardioverted) and was discharged to inpatient rehab for debility.  He underwent debridement of sacral decubitus ulcer on 06/04/2017 by general surgery.  He was supposed to be discharged from [facility] on 06/09/2017 but general surgery recommended continued inpatient wound care so patient was readmitted on 06/09/2017.   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 Grandville Silos on 06/04/2017.  General surgery following.  General surgery is recommending continued inpatient wound care including hydrotherapy.  ESRD on HD  -Nephrology following.  Dialysis as per nephrology schedule.  Hypertension -Blood pressure better today.  Continue Coreg.  Monitor  Type II Diabetes  -Blood sugars still on the higher side.  Increase  Levemir to 30 units subcu at bedtime.  Continue NovoLog with meals.  Continue sliding scale insulin coverage.   Leukocytosis, likely reactive, related to underlying sacral wound and steroids in the setting of RA   -Repeat a.m. labs.  Monitor off antibiotics  Anemia of chronic disease  -Hemoglobin stable.  Monitor.  No signs of bleeding.  Atrial Fibrillation CHA2DS2-VASc score 2-3 , on anticoagulation with Eliquis. Echo 04/25/17  EF 03-49, normal systolic  -Status post recent cardioversion during last hospitalization.  Currently rate controlled Continue amiodarone, Coreg and Eliquis   Chronic diastolic heart failure.  T -Recent hospitalization for acute decompensated heart failure. -Currently compensated and volume is managed by dialysis.  Continue Coreg.  Outpatient follow-up with cardiology  Rheumatoid arthritis with flare Continue 6-week prednisone taper which was started on 05/19/2017. outpatient follow-up with rheumatology.  Patient had been receiving abatacept every Tuesday prior to admission; which will be continued.  Deconditioning  -Continue PT/OT  DVT prophylaxis:  Eliquis  Code Status:    Full  Family Communication:   None at bedside Disposition Plan: Probable home once clinically improved and cleared by general surgery  Consultants: General surgery/nephrology  Procedures: None  Antimicrobials: None   Subjective: Patient seen and examined at bedside.  No overnight fever, nausea or vomiting. Objective: Vitals:   06/10/17 2025 06/11/17 0225 06/11/17 0450 06/11/17 0840  BP: (!) 105/55  111/66 (!) 110/55  Pulse: 83  92 92  Resp: 20  19 18   Temp: 98.6 F (37 C)  98.4 F (36.9 C) 99.8 F (37.7 C)  TempSrc: Oral  Oral Oral  SpO2: 95%  99% 95%  Weight: 110.2 kg (242 lb 15.2 oz)  110.2 kg (242 lb 15.2 oz)      Intake/Output Summary (Last 24 hours) at 06/11/2017 1118 Last data filed at 06/11/2017 0648 Gross per 24 hour  Intake 600 ml  Output 3500 ml  Net  -2900 ml   Filed Weights   06/10/17 1121 06/10/17 2025 06/11/17 0225  Weight: 110.3 kg (243 lb 2.7 oz) 110.2 kg (242 lb 15.2 oz) 110.2 kg (242 lb 15.2 oz)    Examination:  General exam: No distress. Respiratory system: Bilateral decreased breath sounds at bases Cardiovascular system: S1-S2 heard, rate controlled gastrointestinal system: Abdomen is obese, nondistended, soft and nontender. Normal bowel sounds heard. Extremities: No cyanosis, clubbing; trace edema    Data Reviewed: I have personally reviewed following labs and imaging studies  CBC: Recent Labs  Lab 06/05/17 0803 06/08/17 0833 06/09/17 0604 06/10/17 0508 06/11/17 0437  WBC 22.3* 24.6* 19.6* 20.5* 26.0*  NEUTROABS  --   --   --  17.7* 23.2*  HGB 9.4* 7.9* 8.5* 7.9* 8.0*  HCT 30.2* 25.0* 26.6* 24.8* 26.0*  MCV 89.9 86.2 89.0 88.9 88.7  PLT 296 285 288 286 762   Basic Metabolic Panel: Recent Labs  Lab 06/05/17 0803 06/08/17 0832 06/09/17 0604 06/10/17 0508 06/11/17 0437  NA 134* 131* 133* 133* 132*  K 3.5 4.5 4.4 4.8 3.9  CL 98* 96* 97* 99* 96*  CO2 19* 20* 22 22 25   GLUCOSE 128* 420* 341* 344* 323*  BUN 63* 79* 45* 65* 38*  CREATININE 5.45* 7.15* 4.87* 6.30* 4.76*  CALCIUM 8.3* 8.7* 8.6* 8.4* 8.3*  MG  --   --   --  1.7 1.7  PHOS 4.2 6.4*  --   --   --    GFR: Estimated Creatinine Clearance: 25.4 mL/min (A) (by C-G formula based on SCr of 4.76 mg/dL (H)). Liver Function Tests: Recent Labs  Lab 06/05/17 0803 06/08/17 0832  ALBUMIN 2.6* 2.4*   No results for input(s): LIPASE, AMYLASE in the last 168 hours. No results for input(s): AMMONIA in the last 168 hours. Coagulation Profile: No results for input(s): INR, PROTIME in the last 168 hours. Cardiac Enzymes: No results for input(s): CKTOTAL, CKMB, CKMBINDEX, TROPONINI in the last 168 hours. BNP (last 3 results) No results for input(s): PROBNP in the last 8760 hours. HbA1C: No results for input(s): HGBA1C in the last 72  hours. CBG: Recent Labs  Lab 06/09/17 2239 06/10/17 1201 06/10/17 1638 06/10/17 2024 06/11/17 0833  GLUCAP 310* 109* 200* 211* 275*   Lipid Profile: No results for input(s): CHOL, HDL, LDLCALC, TRIG, CHOLHDL, LDLDIRECT in the last 72 hours. Thyroid Function Tests: No results for input(s): TSH, T4TOTAL, FREET4, T3FREE, THYROIDAB in the last 72 hours. Anemia Panel: No results for input(s): VITAMINB12, FOLATE, FERRITIN, TIBC, IRON, RETICCTPCT in the last 72 hours. Sepsis Labs: No results for input(s): PROCALCITON, LATICACIDVEN in the last 168 hours.  Recent Results (from the past 240 hour(s))  MRSA PCR Screening     Status: None   Collection Time: 06/08/17  6:37 PM  Result Value Ref Range Status   MRSA by PCR NEGATIVE NEGATIVE Final    Comment:        The GeneXpert MRSA Assay (FDA approved for NASAL specimens only), is one component of a comprehensive MRSA colonization surveillance program. It is not intended to diagnose MRSA infection nor to guide or monitor treatment for MRSA infections. Performed at [facility], [place] [address] [place], [place] [address]  Radiology Studies: No results found.      Scheduled Meds: . amiodarone  200 mg Oral Daily  . apixaban  5 mg Oral BID  . calcitRIOL  0.5 mcg Oral Q T,Th,Sat-1800  . carvedilol  6.25 mg Oral BID WC  . darbepoetin (ARANESP) injection - DIALYSIS  150 mcg Intravenous Q Thu-HD  . gabapentin  300 mg Oral Daily  . insulin aspart  0-15 Units Subcutaneous TID WC  . insulin aspart  0-5 Units Subcutaneous QHS  . insulin aspart  5 Units Subcutaneous TID WC  . insulin glargine  25 Units Subcutaneous QHS  . predniSONE  10 mg Oral BID WC   Followed by  . [START ON 06/12/2017] predniSONE  10 mg Oral Q breakfast  . sodium hypochlorite   Irrigation BID   Continuous Infusions:   LOS: 3 days        Aline August, MD [facility] Pager [phone]  If 7PM-7AM, please contact  night-coverage [email or web address] Password [email or web address] 06/11/2017, 11:18 AM

## 2017-06-11 NOTE — Progress Notes (Signed)
PT Cancellation Note  Patient Details Name: John Bailey MRN: [id number] DOB: [birth year]   Cancelled Treatment:    Reason Eval/Treat Not Completed: Patient declined, no reason specified. Pt reported that he had just gotten back into bed after sitting in his w/c for a while. Pt also stated that he had ambulated into the bathroom and washed himself up. PT will continue to f/u with pt as available.    [place] 06/11/2017, 12:14 PM

## 2017-06-11 NOTE — Progress Notes (Signed)
   [facility] Progress Note   New ESRD to be placed at [facility] shift He continues in therapy  Debility secondary to cardiorenal syndrome and is scheduled for debridement of sacral wound. Has history of rheumatoid arthritis and continues on Prednisone 10 mg daily  He also has atrial fibrillation and is taking amiodarone  He has a fistula placed  Exacerbation of RA improvedPatient with sacral wound that is not healing well enough for discharge    Assessment/ Plan:    ESRD- TTS dialysis schedule - HD tomorrow -> slowly challenge EDW. - No acute indication today.   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     Objective:   BP (!) 110/55 (BP Location: Right Arm)   Pulse 92   Temp 99.8 F (37.7 C) (Oral)   Resp 18   Wt 110.2 kg (242 lb 15.2 oz)   SpO2 95%   BMI 32.94 kg/m   Intake/Output Summary (Last 24 hours) at 06/11/2017 1329 Last data filed at 06/11/2017 0648 Gross per 24 hour  Intake 600 ml  Output 0 ml  Net 600 ml   Weight change: -7.949 kg (-17 lb 8.4 oz)  Physical Exam: GEN - NAD CVS- RRR RS- CTA ABD- BS present soft non-distended EXT- 1 + Edema Synovitis bilateral hands    Imaging: No results found.  Labs: BMET Recent Labs  Lab 06/05/17 0803 06/08/17 0258 06/09/17 0604 06/10/17 0508 06/11/17 0437  NA 134* 131* 133* 133* 132*  K 3.5 4.5 4.4 4.8 3.9  CL 98* 96* 97* 99* 96*  CO2 19* 20* 22 22 25   GLUCOSE 128* 420* 341* 344* 323*  BUN 63* 79* 45* 65* 38*  CREATININE 5.45* 7.15* 4.87* 6.30* 4.76*  CALCIUM 8.3* 8.7* 8.6* 8.4* 8.3*  PHOS 4.2 6.4*  --   --   --    CBC Recent Labs  Lab 06/08/17 0833 06/09/17 0604 06/10/17 0508  06/11/17 0437  WBC 24.6* 19.6* 20.5* 26.0*  NEUTROABS  --   --  17.7* 23.2*  HGB 7.9* 8.5* 7.9* 8.0*  HCT 25.0* 26.6* 24.8* 26.0*  MCV 86.2 89.0 88.9 88.7  PLT 285 288 286 297    Medications:    . amiodarone  200 mg Oral Daily  . apixaban  5 mg Oral BID  . calcitRIOL  0.5 mcg Oral Q T,Th,Sat-1800  . carvedilol  6.25 mg Oral BID WC  . darbepoetin (ARANESP) injection - DIALYSIS  150 mcg Intravenous Q Thu-HD  . gabapentin  300 mg Oral Daily  . insulin aspart  0-15 Units Subcutaneous TID WC  . insulin aspart  0-5 Units Subcutaneous QHS  . insulin aspart  5 Units Subcutaneous TID WC  . insulin glargine  30 Units Subcutaneous QHS  . predniSONE  10 mg Oral BID WC   Followed by  . [START ON 06/12/2017] predniSONE  10 mg Oral Q breakfast  . sodium hypochlorite   Irrigation BID      Otelia Santee, MD 06/11/2017, 1:29 PM

## 2017-06-11 NOTE — Progress Notes (Signed)
Physical Therapy Wound Treatment Patient Details  Name: John Bailey MRN: [id number] Date of Birth: 06/05/1970  Today's Date: 06/11/2017 Time: 7793-9030 Time Calculation (min): 31 min  Subjective  Subjective: Pt very depressed after heraing from surgical PA that he will need to stay through the weekenc Patient and Family Stated Goals: heal wound decrease odor Prior Treatments: Surgical I&D 06/04/17  Pain Score:  Pt premedicated prior to treatment. By facial pain scale pt experiencing 8/10 pain with hydrotherapy and debridement.   Wound Assessment  Pressure Injury 05/28/17 Unstageable - Full thickness tissue loss in which the base of the ulcer is covered by slough (yellow, tan, gray, green or brown) and/or eschar (tan, brown or black) in the wound bed. Central gluteal fold (Active)  Dressing Type ABD;Barrier Film (skin prep);Gauze (Comment);Moist to dry;Other (Comment) 06/11/2017  4:00 PM  Dressing Changed 06/11/2017  4:00 PM  Dressing Change Frequency Twice a day 06/11/2017  4:00 PM  State of Healing Early/partial granulation 06/11/2017  4:00 PM  Site / Wound Assessment Yellow;Brown;Black;Bleeding;Red 06/11/2017  4:00 PM  % Wound base Red or Granulating 70% 06/11/2017  4:00 PM  % Wound base Yellow/Fibrinous Exudate 30% 06/11/2017  4:00 PM  Peri-wound Assessment Erythema (blanchable);Maceration;Pink;Denuded 06/11/2017  4:00 PM  Wound Length (cm) 11 cm 06/09/2017  5:00 PM  Wound Width (cm) 5.5 cm 06/09/2017  5:00 PM  Wound Depth (cm) 5 cm 06/09/2017  5:00 PM  Wound Surface Area (cm^2) 60.5 cm^2 06/09/2017  5:00 PM  Wound Volume (cm^3) 302.5 cm^3 06/09/2017  5:00 PM  Tunneling (cm) 6 cm at 12 o'clock 06/02/2017 11:00 AM  Margins Unattached edges (unapproximated) 06/11/2017  4:00 PM  Drainage Amount Moderate 06/11/2017  4:00 PM  Drainage Description Odor;Serosanguineous;Sanguineous 06/11/2017  4:00 PM  Treatment Cleansed;Debridement (Selective);Hydrotherapy (Pulse lavage);Other (Comment) 06/11/2017  4:00  PM     Wound / Incision (Open or Dehisced) 05/28/17 Non-pressure wound Sacrum Right;Left;Medial R buttock (Active)  Dressing Type ABD;Barrier Film (skin prep);Other (Comment) 06/11/2017  4:00 PM  Dressing Changed Changed 06/11/2017  4:00 PM  Dressing Status Clean;Dry;Intact 06/11/2017  4:00 PM  Dressing Change Frequency Daily 06/11/2017  4:00 PM  Site / Wound Assessment Yellow;Painful 06/11/2017  4:00 PM  % Wound base Red or Granulating 35% 06/11/2017  4:00 PM  % Wound base Yellow/Fibrinous Exudate 65% 06/11/2017  4:00 PM  Peri-wound Assessment Denuded;Pink 06/11/2017  4:00 PM  Wound Length (cm) 3.5 cm 06/09/2017  5:00 PM  Wound Width (cm) 2.5 cm 06/09/2017  5:00 PM  Wound Surface Area (cm^2) 8.75 cm^2 06/09/2017  5:00 PM  Margins Unattached edges (unapproximated) 06/11/2017  4:00 PM  Closure None 06/11/2017  4:00 PM  Drainage Amount Moderate 06/11/2017  4:00 PM  Drainage Description Odor 06/11/2017  4:00 PM  Treatment Cleansed 06/11/2017  4:00 PM      Hydrotherapy Pulsed lavage therapy - wound location: sacrum Pulsed Lavage with Suction (psi): 4 psi(4-12) Pulsed Lavage with Suction - Normal Saline Used: 1000 mL Pulsed Lavage Tip: Tip with splash shield Selective Debridement Selective Debridement - Location: sacrum  Selective Debridement - Tools Used: Forceps;Scissors Selective Debridement - Tissue Removed: brown, gray yellow, necrotic tissue.    Wound Assessment and Plan  Wound Therapy - Assess/Plan/Recommendations Wound Therapy - Clinical Statement: Wound exudate no longer has green tint to it however continues to have foul odor as packing is removed from base of wound. Small amount of black necrotic tissue from distal base of wound.  Factors Delaying/Impairing Wound Healing: Immobility;Multiple medical problems;Diabetes Mellitus  Hydrotherapy Plan: Debridement;Patient/family education;Pulsatile lavage with suction Wound Therapy - Frequency: 6X / week Wound Therapy - Current Recommendations:  Surgery consult;WOC nurse Wound Therapy - Follow Up Recommendations: Other (comment) Wound Plan: see above  Wound Therapy Goals- Improve the function of patient's integumentary system by progressing the wound(s) through the phases of wound healing (inflammation - proliferation - remodeling) by: Decrease Necrotic Tissue to: 50 Decrease Necrotic Tissue - Progress: Progressing toward goal Increase Granulation Tissue to: 50 Increase Granulation Tissue - Progress: Progressing toward goal Goals/treatment plan/discharge plan were made with and agreed upon by patient/family: Yes Time For Goal Achievement: 7 days Wound Therapy - Potential for Goals: Fair  Goals will be updated until maximal potential achieved or discharge criteria met.  Discharge criteria: when goals achieved, discharge from hospital, MD decision/surgical intervention, no progress towards goals, refusal/missing three consecutive treatments without notification or medical reason.  GP    Dani Gobble. [name] PT, DPT Acute Rehabilitation  [phone] Pager [phone]   [place] 06/11/2017, 4:55 PM

## 2017-06-12 LAB — BASIC METABOLIC PANEL
ANION GAP: 14 (ref 5–15)
BUN: 54 mg/dL — ABNORMAL HIGH (ref 6–20)
CALCIUM: 8.7 mg/dL — AB (ref 8.9–10.3)
CO2: 24 mmol/L (ref 22–32)
Chloride: 97 mmol/L — ABNORMAL LOW (ref 101–111)
Creatinine, Ser: 6.19 mg/dL — ABNORMAL HIGH (ref 0.61–1.24)
GFR, EST AFRICAN AMERICAN: 11 mL/min — AB (ref >60)
GFR, EST NON AFRICAN AMERICAN: 10 mL/min — AB (ref >60)
GLUCOSE: 142 mg/dL — AB (ref 65–99)
POTASSIUM: 4.4 mmol/L (ref 3.5–5.1)
Sodium: 135 mmol/L (ref 135–145)

## 2017-06-12 LAB — CBC WITH DIFFERENTIAL/PLATELET
BASOS ABS: 0 10*3/uL (ref 0.0–0.1)
Basophils Relative: 0 %
EOS PCT: 0 %
Eosinophils Absolute: 0 10*3/uL (ref 0.0–0.7)
HEMATOCRIT: 29.2 % — AB (ref 39.0–52.0)
Hemoglobin: 8.9 g/dL — ABNORMAL LOW (ref 13.0–17.0)
LYMPHS ABS: 1.2 10*3/uL (ref 0.7–4.0)
Lymphocytes Relative: 5 %
MCH: 27.1 pg (ref 26.0–34.0)
MCHC: 30.5 g/dL (ref 30.0–36.0)
MCV: 88.8 fL (ref 78.0–100.0)
MONOS PCT: 6 %
Monocytes Absolute: 1.5 10*3/uL — ABNORMAL HIGH (ref 0.1–1.0)
NEUTROS PCT: 89 %
Neutro Abs: 21.8 10*3/uL — ABNORMAL HIGH (ref 1.7–7.7)
PLATELETS: 343 10*3/uL (ref 150–400)
RBC: 3.29 MIL/uL — ABNORMAL LOW (ref 4.22–5.81)
RDW: 21.8 % — AB (ref 11.5–15.5)
WBC: 24.5 10*3/uL — AB (ref 4.0–10.5)

## 2017-06-12 LAB — GLUCOSE, CAPILLARY
GLUCOSE-CAPILLARY: 109 mg/dL — AB (ref 65–99)
GLUCOSE-CAPILLARY: 187 mg/dL — AB (ref 65–99)
Glucose-Capillary: 247 mg/dL — ABNORMAL HIGH (ref 65–99)

## 2017-06-12 LAB — MAGNESIUM: Magnesium: 1.7 mg/dL (ref 1.7–2.4)

## 2017-06-12 MED ORDER — INSULIN GLARGINE 100 UNIT/ML ~~LOC~~ SOLN
25.0000 [IU] | Freq: Every day | SUBCUTANEOUS | Status: DC
  Administered 2017-06-12 – 2017-06-29 (×16): 25 [IU] via SUBCUTANEOUS
  Filled 2017-06-12 (×19): qty 0.25

## 2017-06-12 NOTE — Progress Notes (Signed)
Physical Therapy Wound Treatment Patient Details  Name: John Bailey MRN: [id number] Date of Birth: May 25, 1970  Today's Date: 06/12/2017 Time: 8315-1761 Time Calculation (min): 41 min  Subjective  Subjective: "I can't roll to my R side it hurts too much from how I laid in dialysis." Patient and Family Stated Goals: heal wound decrease odor  Pain Score: Pain Score: 5   Wound Assessment  Pressure Injury 05/28/17 Unstageable - Full thickness tissue loss in which the base of the ulcer is covered by slough (yellow, tan, gray, green or brown) and/or eschar (tan, brown or black) in the wound bed. Central gluteal fold (Active)  Dressing Type ABD;Moist to dry 06/12/2017  2:42 PM  Dressing Changed 06/12/2017  2:42 PM  Dressing Change Frequency Twice a day 06/12/2017 11:20 AM  State of Healing Early/partial granulation 06/12/2017  2:42 PM  Site / Wound Assessment Painful;Pink;Yellow;Brown;Dusky 06/12/2017  2:42 PM  % Wound base Red or Granulating 70% 06/12/2017  2:42 PM  % Wound base Yellow/Fibrinous Exudate 30% 06/12/2017  2:42 PM  Peri-wound Assessment Erythema (blanchable);Maceration;Pink;Denuded 06/12/2017  2:42 PM  Wound Length (cm) 11 cm 06/09/2017  5:00 PM  Wound Width (cm) 5.5 cm 06/09/2017  5:00 PM  Wound Depth (cm) 5 cm 06/09/2017  5:00 PM  Wound Surface Area (cm^2) 60.5 cm^2 06/09/2017  5:00 PM  Wound Volume (cm^3) 302.5 cm^3 06/09/2017  5:00 PM  Tunneling (cm) 6 cm at 12 o'clock, and 4.5 cm between 3 and 4 oclock with purulent drainage.   06/02/2017 11:00 AM  Margins Unattached edges (unapproximated) 06/12/2017  2:42 PM  Drainage Amount Copious 06/12/2017  2:42 PM  Drainage Description Odor;Purulent 06/12/2017  2:42 PM  Treatment Cleansed;Tape changed;Hydrotherapy (Pulse lavage);Packing (Saline gauze);Debridement (Selective) 06/12/2017  2:42 PM     Wound / Incision (Open or Dehisced) 05/28/17 Non-pressure wound Sacrum Right;Left;Medial R buttock (Active)  Dressing Type Gauze (Comment);Moist to  dry;Other (Comment) 06/12/2017  2:42 PM  Dressing Changed Changed 06/12/2017  2:42 PM  Dressing Status Clean;Intact 06/12/2017  2:42 PM  Dressing Change Frequency Twice a day 06/12/2017  2:42 PM  Site / Wound Assessment Granulation tissue;Yellow;Painful 06/12/2017  2:42 PM  % Wound base Red or Granulating 35% 06/12/2017  2:42 PM  % Wound base Yellow/Fibrinous Exudate 65% 06/12/2017  2:42 PM  Peri-wound Assessment Denuded;Pink;Induration 06/12/2017  2:42 PM  Wound Length (cm) 3.5 cm 06/09/2017  5:00 PM  Wound Width (cm) 2.5 cm 06/09/2017  5:00 PM  Wound Surface Area (cm^2) 8.75 cm^2 06/09/2017  5:00 PM  Margins Unattached edges (unapproximated) 06/12/2017  2:42 PM  Closure None 06/12/2017  2:42 PM  Drainage Amount Moderate 06/11/2017 11:49 PM  Drainage Description Odor;Purulent 06/12/2017  2:42 PM  Treatment Cleansed;Tape changed;Packing (Saline gauze);Hydrotherapy (Pulse lavage);Debridement (Selective) 06/12/2017  2:42 PM   Hydrotherapy Pulsed lavage therapy - wound location: sacrum Pulsed Lavage with Suction (psi): 12 psi Pulsed Lavage with Suction - Normal Saline Used: 1000 mL Pulsed Lavage Tip: Tip with splash shield Selective Debridement Selective Debridement - Location: sacrum  Selective Debridement - Tools Used: Forceps;Scissors Selective Debridement - Tissue Removed: brown, gray yellow, necrotic tissue.    Wound Assessment and Plan  Wound Therapy - Assess/Plan/Recommendations Wound Therapy - Clinical Statement: Pt with copious amounts of purulent drainage this afternoon.  Foul odor remains throughout tx.  Daikins not used due to surgical PAs reference that last hydro treatment was the last day for daikins.  Found new tunnel tracking between 3 and 4 oclock and 4.5 cm in depth.  Tracking in a distal lateral pattern (diagonal).   Factors Delaying/Impairing Wound Healing: Immobility;Multiple medical problems;Diabetes Mellitus Hydrotherapy Plan: Debridement;Patient/family education;Pulsatile lavage  with suction Wound Therapy - Frequency: 6X / week Wound Therapy - Current Recommendations: Surgery consult;WOC nurse Wound Therapy - Follow Up Recommendations: Other (comment) Wound Plan: see above  Wound Therapy Goals- Improve the function of patient's integumentary system by progressing the wound(s) through the phases of wound healing (inflammation - proliferation - remodeling) by: Decrease Necrotic Tissue to: 50 Decrease Necrotic Tissue - Progress: Progressing toward goal Increase Granulation Tissue to: 50 Increase Granulation Tissue - Progress: Progressing toward goal Goals/treatment plan/discharge plan were made with and agreed upon by patient/family: Yes Wound Therapy - Potential for Goals: Fair  Goals will be updated until maximal potential achieved or discharge criteria met.  Discharge criteria: when goals achieved, discharge from hospital, MD decision/surgical intervention, no progress towards goals, refusal/missing three consecutive treatments without notification or medical reason.  GP     Joshalyn Ancheta Eli Hose 06/12/2017, 2:53 PM  [place], PTA pager [phone]

## 2017-06-12 NOTE — Progress Notes (Signed)
Patient ID: John Bailey, male   DOB: 12-Jul-1970, 47 y.o.   MRN: [id number]  PROGRESS NOTE    CAYCE QUEZADA  ERX:[id number] DOB: 1970-06-10 DOA: 06/08/2017 PCP: [name], MD   Brief Narrative:  47 year old male with history of RA, PVD, hypertension, end-stage renal disease on hemodialysis, A. fib on Eliquis, diabetes and chronic diastolic CHF and sacral and coccyx pressure ulcer who was admitted from 04/22/2017-05/13/2017 with anasarca from acute on chronic diastolic heart failure and A. fib with RVR (cardioverted) and was discharged to inpatient rehab for debility.  He underwent debridement of sacral decubitus ulcer on 06/04/2017 by general surgery.  He was supposed to be discharged from [facility] on 06/09/2017 but general surgery recommended continued inpatient wound care so patient was readmitted on 06/09/2017.   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 Grandville Silos on 06/04/2017.  General surgery following.  General surgery is recommending continued inpatient wound care including hydrotherapy.  Neurosurgery following.  Continue wound care and hydrotherapy  ESRD on HD  -Nephrology following.  Dialysis as per nephrology schedule.  Hypertension -Blood pressure on the lower side.  Monitor.  Continue Coreg  Type II  Diabetes  -With intermittent hyperglycemia and hypoglycemia.  Decrease Levemir to 25 units subcu at bedtime.  Continue Accu-Cheks with coverage along with NovoLog with meals.  Leukocytosis, likely reactive, related to underlying sacral wound and steroids in the setting of RA   -Repeat a.m. labs.  Monitor off antibiotics  Anemia of chronic disease  -Hemoglobin stable.  Monitor.  No signs of bleeding.  Atrial Fibrillation CHA2DS2-VASc score 2-3 , on anticoagulation with Eliquis. Echo 04/25/17  EF 95-09, normal systolic  -Status post recent cardioversion during last hospitalization.  Currently rate controlled Continue amiodarone, Coreg and Eliquis   Chronic diastolic heart failure.  T -Recent hospitalization for acute decompensated heart failure. -Currently compensated and volume is managed by dialysis.  Continue Coreg.  Outpatient follow-up with cardiology  Rheumatoid arthritis with flare Continue 6-week prednisone taper which was started on 05/19/2017. outpatient follow-up with rheumatology.  Patient had been receiving abatacept every Tuesday prior to admission; which will be continued.  Deconditioning  -Continue PT/OT  DVT prophylaxis:  Eliquis  Code Status:    Full  Family Communication:   None at bedside Disposition Plan: Probable home once clinically improved and cleared by general surgery  Consultants: General surgery/nephrology  Procedures: None  Antimicrobials: None   Subjective: Patient seen and examined at bedside undergoing dialysis.  No new complaints.  No overnight fever, nausea or vomiting or chest pain.    Objective: Vitals:   06/12/17 0900 06/12/17 0930 06/12/17 1000 06/12/17 1030  BP: (!) 104/59 (!) 105/59 (!) 90/40 (!) 92/44  Pulse: 96 92 90 80  Resp:      Temp:      TempSrc:      SpO2:  Weight:      Height:        Intake/Output Summary (Last 24 hours) at 06/12/2017 1117 Last data filed at 06/12/2017 0600 Gross per 24 hour  Intake 660 ml    Output 0 ml  Net 660 ml   Filed Weights   06/11/17 2135 06/12/17 0000 06/12/17 0720  Weight: 110.3 kg (243 lb 2.7 oz) 110.3 kg (243 lb 2.7 oz) 110.3 kg (243 lb 2.7 oz)    Examination:  General exam: No distress.  Calm and comfortable. Respiratory system: Bilateral decreased breath sounds at bases  cardiovascular system: Rate controlled, S1-S2 heard  gastrointestinal system: Abdomen is obese, nondistended, soft and nontender. Normal bowel sounds heard. Extremities: No cyanosis, clubbing; trace edema    Data Reviewed: I have personally reviewed following labs and imaging studies  CBC: Recent Labs  Lab 06/08/17 0833 06/09/17 0604 06/10/17 0508 06/11/17 0437 06/12/17 0358  WBC 24.6* 19.6* 20.5* 26.0* 24.5*  NEUTROABS  --   --  17.7* 23.2* 21.8*  HGB 7.9* 8.5* 7.9* 8.0* 8.9*  HCT 25.0* 26.6* 24.8* 26.0* 29.2*  MCV 86.2 89.0 88.9 88.7 88.8  PLT 285 288 286 297 222   Basic Metabolic Panel: Recent Labs  Lab 06/08/17 0832 06/09/17 0604 06/10/17 0508 06/11/17 0437 06/12/17 0358  NA 131* 133* 133* 132* 135  K 4.5 4.4 4.8 3.9 4.4  CL 96* 97* 99* 96* 97*  CO2 20* 22 22 25 24   GLUCOSE 420* 341* 344* 323* 142*  BUN 79* 45* 65* 38* 54*  CREATININE 7.15* 4.87* 6.30* 4.76* 6.19*  CALCIUM 8.7* 8.6* 8.4* 8.3* 8.7*  MG  --   --  1.7 1.7 1.7  PHOS 6.4*  --   --   --   --    GFR: Estimated Creatinine Clearance: 19.5 mL/min (A) (by C-G formula based on SCr of 6.19 mg/dL (H)). Liver Function Tests: Recent Labs  Lab 06/08/17 0832  ALBUMIN 2.4*   No results for input(s): LIPASE, AMYLASE in the last 168 hours. No results for input(s): AMMONIA in the last 168 hours. Coagulation Profile: No results for input(s): INR, PROTIME in the last 168 hours. Cardiac Enzymes: No results for input(s): CKTOTAL, CKMB, CKMBINDEX, TROPONINI in the last 168 hours. BNP (last 3 results) No results for input(s): PROBNP in the last 8760 hours. HbA1C: No results for input(s): HGBA1C in the last 72  hours. CBG: Recent Labs  Lab 06/10/17 2024 06/11/17 0833 06/11/17 1151 06/11/17 1632 06/11/17 2115  GLUCAP 211* 275* 278* 89 194*   Lipid Profile: No results for input(s): CHOL, HDL, LDLCALC, TRIG, CHOLHDL, LDLDIRECT in the last 72 hours. Thyroid Function Tests: No results for input(s): TSH, T4TOTAL, FREET4, T3FREE, THYROIDAB in the last 72 hours. Anemia Panel: No results for input(s): VITAMINB12, FOLATE, FERRITIN, TIBC, IRON, RETICCTPCT in the last 72 hours. Sepsis Labs: No results for input(s): PROCALCITON, LATICACIDVEN in the last 168 hours.  Recent Results (from the past 240 hour(s))  MRSA PCR Screening     Status: None   Collection Time: 06/08/17  6:37 PM  Result Value Ref Range Status   MRSA by PCR NEGATIVE NEGATIVE Final    Comment:        The GeneXpert MRSA Assay (FDA approved for NASAL specimens only), is one component of a comprehensive MRSA colonization surveillance program. It is not intended to diagnose MRSA infection nor to guide or monitor treatment for MRSA infections. Performed at [facility], [place] [address] [place], [place] [address]  Radiology Studies: No results found.      Scheduled Meds: . amiodarone  200 mg Oral Daily  . apixaban  5 mg Oral BID  . calcitRIOL  0.5 mcg Oral Q T,Th,Sat-1800  . carvedilol  6.25 mg Oral BID WC  . darbepoetin (ARANESP) injection - DIALYSIS  150 mcg Intravenous Q Thu-HD  . gabapentin  300 mg Oral Daily  . insulin aspart  0-15 Units Subcutaneous TID WC  . insulin aspart  0-5 Units Subcutaneous QHS  . insulin aspart  5 Units Subcutaneous TID WC  . insulin glargine  30 Units Subcutaneous QHS  . predniSONE  10 mg Oral Q breakfast   Continuous Infusions:   LOS: 4 days        Aline August, MD [facility] Pager [phone]  If 7PM-7AM, please contact night-coverage [email or web address] Password [email or web address] 06/12/2017, 11:17 AM

## 2017-06-12 NOTE — Progress Notes (Signed)
  [facility] Progress Note   New ESRD to be placed at [facility] TTS shift He continues in therapy  Debility secondary to cardiorenal syndrome and is scheduled for debridement of sacral wound. Has history of rheumatoid arthritis and continues on Prednisone 10 mg daily  He also has atrial fibrillation and is taking amiodarone  He has a fistula placed  Exacerbation of RA improvedPatient with sacral wound that is not healing well enough for discharge   Assessment/ Plan:    ESRD- TTS dialysis schedule Seen on HD this AM at 1024AM  [place] challenge EDW Qb/qd 300/600 UF 3.5 L(Net of 3L) No changes. 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   Objective:   BP (!) 92/44   Pulse 80   Temp 100 F (37.8 C) (Oral)   Resp 20   Ht 6' (1.829 m)   Wt 110.3 kg (243 lb 2.7 oz)   SpO2 98%   BMI 32.98 kg/m   Intake/Output Summary (Last 24 hours) at 06/12/2017 1038 Last data filed at 06/12/2017 0600 Gross per 24 hour  Intake 660 ml  Output 0 ml  Net 660 ml   Weight change: -3.553 kg (-7 lb 13.3 oz)  Physical Exam: GEN - NAD CVS- RRR RS- CTA ABD- BS present soft non-distended EXT- 1 + Edema Synovitis bilateral hands    Imaging: No results found.  Labs: BMET Recent Labs  Lab 06/08/17 0832 06/09/17 0604 06/10/17 0508 06/11/17 0437 06/12/17 0358  NA 131* 133* 133* 132* 135  K 4.5 4.4 4.8 3.9 4.4  CL 96* 97* 99* 96* 97*  CO2 20* 22 22 25 24   GLUCOSE 420* 341* 344* 323* 142*  BUN 79* 45* 65* 38* 54*  CREATININE 7.15* 4.87* 6.30* 4.76* 6.19*  CALCIUM 8.7* 8.6* 8.4* 8.3* 8.7*  PHOS 6.4*  --    --   --   --    CBC Recent Labs  Lab 06/09/17 0604 06/10/17 0508 06/11/17 0437 06/12/17 0358  WBC 19.6* 20.5* 26.0* 24.5*  NEUTROABS  --  17.7* 23.2* 21.8*  HGB 8.5* 7.9* 8.0* 8.9*  HCT 26.6* 24.8* 26.0* 29.2*  MCV 89.0 88.9 88.7 88.8  PLT 288 286 297 343    Medications:    . amiodarone  200 mg Oral Daily  . apixaban  5 mg Oral BID  . calcitRIOL  0.5 mcg Oral Q T,Th,Sat-1800  . carvedilol  6.25 mg Oral BID WC  . darbepoetin (ARANESP) injection - DIALYSIS  150 mcg Intravenous Q Thu-HD  . gabapentin  300 mg Oral Daily  . insulin aspart  0-15 Units Subcutaneous TID WC  . insulin aspart  0-5 Units Subcutaneous QHS  . insulin aspart  5 Units Subcutaneous TID WC  . insulin glargine  30 Units Subcutaneous QHS  . predniSONE  10 mg Oral Q breakfast      Otelia Santee, MD 06/12/2017, 10:38 AM

## 2017-06-13 LAB — GLUCOSE, CAPILLARY
GLUCOSE-CAPILLARY: 129 mg/dL — AB (ref 65–99)
GLUCOSE-CAPILLARY: 161 mg/dL — AB (ref 65–99)
GLUCOSE-CAPILLARY: 109 mg/dL — AB (ref 65–99)
Glucose-Capillary: 185 mg/dL — ABNORMAL HIGH (ref 65–99)

## 2017-06-13 NOTE — Progress Notes (Signed)
Refused Tylenol for his fever.

## 2017-06-13 NOTE — Progress Notes (Signed)
Sacral dressing changed done.

## 2017-06-13 NOTE — Progress Notes (Signed)
Patient ID: BENI TURRELL, male   DOB: 21-Dec-1970, 47 y.o.   MRN: [id number]  PROGRESS NOTE    KMARION RAWL  XKG:[id number] DOB: Apr 03, 1970 DOA: 06/08/2017 PCP: [name], MD   Brief Narrative:  47 year old male with history of RA, PVD, hypertension, end-stage renal disease on hemodialysis, A. fib on Eliquis, diabetes and chronic diastolic CHF and sacral and coccyx pressure ulcer who was admitted from 04/22/2017-05/13/2017 with anasarca from acute on chronic diastolic heart failure and A. fib with RVR (cardioverted) and was discharged to inpatient rehab for debility.  He underwent debridement of sacral decubitus ulcer on 06/04/2017 by general surgery.  He was supposed to be discharged from [facility] on 06/09/2017 but general surgery recommended continued inpatient wound care so patient was readmitted on 06/09/2017.   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  May be secondary to the sacral ulcer -No worsening diarrhea or cough.  Respiratory status looks stable.  No abdominal pain.  Blood cultures.  Monitor temperature curve.  Monitor off antibiotics for now.  Unstageable Sacral ulcer status post debridement -Debridement was done by Dr. Grandville Silos on 06/04/2017.  General surgery is recommending continued inpatient wound care including hydrotherapy.  General surgery  following.  We will follow-up with general surgery regarding needs for further debridement  ESRD on HD  -Nephrology following.  Dialysis as per nephrology schedule.  Hypertension -Blood pressure on the lower side.  Monitor.  Continue Coreg  Type II Diabetes  -Continue current dose of Levemir along with NovoLog with meals.  Continue Accu-Cheks with coverage.  Leukocytosis, likely reactive, related to underlying sacral wound and steroids in the setting of RA   -Repeat a.m. labs.  Monitor off antibiotics  Anemia of chronic disease  -Hemoglobin stable.  Monitor.  No signs of bleeding.  Atrial Fibrillation CHA2DS2-VASc score 2-3 , on anticoagulation with Eliquis. Echo 04/25/17  EF 70-26, normal systolic  -Status post recent cardioversion during last hospitalization.  Currently rate controlled Continue amiodarone, Coreg and Eliquis   Chronic diastolic heart failure.  T -Recent hospitalization for acute decompensated heart failure. -Currently compensated and volume is managed by dialysis.  Continue Coreg.  Outpatient follow-up with cardiology  Rheumatoid arthritis with flare Continue 6-week prednisone taper which was started on 05/19/2017. outpatient follow-up with rheumatology.  Patient had been receiving abatacept every Tuesday prior to admission; which will be continued.  Deconditioning  -Continue PT/OT  DVT prophylaxis:  Eliquis  Code Status:    Full  Family Communication:   None at bedside Disposition Plan: Probable home once clinically improved and cleared by general surgery  Consultants: General surgery/nephrology  Procedures: None  Antimicrobials: None   Subjective: Patient seen and examined at bedside.  Patient does not complain of worsening abdominal pain, diarrhea, shortness of breath or cough. Objective: Vitals:   06/12/17 1129 06/12/17 1230 06/12/17 2120 06/13/17 0816  BP: (!) 98/56 (!) 100/58 (!) 89/44 [name])  107/54  Pulse: 80 93 87 94  Resp: 18 18 18 18     Temp: 99.7 F (37.6 C) 98.8 F (37.1 C) 98.7 F (37.1 C) (!) 101 F (38.3 C)  TempSrc: Oral Oral Oral Oral  SpO2: 100% 96% 94% 94%  Weight: 108.4 kg (238 lb 15.7 oz)  108.4 kg (238 lb 15.7 oz)   Height:        Intake/Output Summary (Last 24 hours) at 06/13/2017 0954 Last data filed at 06/13/2017 0641 Gross per 24 hour  Intake 0 ml  Output 2000 ml  Net -2000 ml   Filed Weights   06/12/17 0720 06/12/17 1129 06/12/17 2120  Weight: 110.3 kg (243 lb 2.7 oz) 108.4 kg (238 lb 15.7 oz) 108.4 kg (238 lb 15.7 oz)    Examination:  General exam: No acute distress. Respiratory system: Bilateral decreased breath sounds at bases cardiovascular system: Rate controlled, S1-S2 heard  gastrointestinal system: Abdomen is obese, nondistended, soft and nontender. Normal bowel sounds heard. Extremities: No cyanosis; trace edema  Data Reviewed: I have personally reviewed following labs and imaging studies  CBC: Recent Labs  Lab 06/08/17 0833 06/09/17 0604 06/10/17 0508 06/11/17 0437 06/12/17 0358  WBC 24.6* 19.6* 20.5* 26.0* 24.5*  NEUTROABS  --   --  17.7* 23.2* 21.8*  HGB 7.9* 8.5* 7.9* 8.0* 8.9*  HCT 25.0* 26.6* 24.8* 26.0* 29.2*  MCV 86.2 89.0 88.9 88.7 88.8  PLT 285 288 286 297 099   Basic Metabolic Panel: Recent Labs  Lab 06/08/17 0832 06/09/17 0604 06/10/17 0508 06/11/17 0437 06/12/17 0358  NA 131* 133* 133* 132* 135  K 4.5 4.4 4.8 3.9 4.4  CL 96* 97* 99* 96* 97*  CO2 20* 22 22 25 24   GLUCOSE 420* 341* 344* 323* 142*  BUN 79* 45* 65* 38* 54*  CREATININE 7.15* 4.87* 6.30* 4.76* 6.19*  CALCIUM 8.7* 8.6* 8.4* 8.3* 8.7*  MG  --   --  1.7 1.7 1.7  PHOS 6.4*  --   --   --   --    GFR: Estimated Creatinine Clearance: 19.4 mL/min (A) (by C-G formula based on SCr of 6.19 mg/dL (H)). Liver Function Tests: Recent Labs  Lab 06/08/17 0832  ALBUMIN 2.4*   No results for input(s): LIPASE, AMYLASE in the last 168 hours. No results for input(s): AMMONIA in the last 168  hours. Coagulation Profile: No results for input(s): INR, PROTIME in the last 168 hours. Cardiac Enzymes: No results for input(s): CKTOTAL, CKMB, CKMBINDEX, TROPONINI in the last 168 hours. BNP (last 3 results) No results for input(s): PROBNP in the last 8760 hours. HbA1C: No results for input(s): HGBA1C in the last 72 hours. CBG: Recent Labs  Lab 06/11/17 2115 06/12/17 1230 06/12/17 1711 06/12/17 2119 06/13/17 0806  GLUCAP 194* 109* 247* 187* 129*   Lipid Profile: No results for input(s): CHOL, HDL, LDLCALC, TRIG, CHOLHDL, LDLDIRECT in the last 72 hours. Thyroid Function Tests: No results for input(s): TSH, T4TOTAL, FREET4, T3FREE, THYROIDAB in the last 72 hours. Anemia Panel: No results for input(s): VITAMINB12, FOLATE, FERRITIN, TIBC, IRON, RETICCTPCT in the last 72 hours. Sepsis Labs: No results for input(s): PROCALCITON, LATICACIDVEN in the last 168 hours.  Recent Results (from the past 240 hour(s))  MRSA PCR Screening     Status: None   Collection Time: 06/08/17  6:37 PM  Result Value Ref Range Status   MRSA by PCR NEGATIVE NEGATIVE Final    Comment:        The GeneXpert  MRSA Assay (FDA approved for NASAL specimens only), is one component of a comprehensive MRSA colonization surveillance program. It is not intended to diagnose MRSA infection nor to guide or monitor treatment for MRSA infections. Performed at [facility], [place] [address], [place], [place] [address]          Radiology Studies: No results found.      Scheduled Meds: . amiodarone  200 mg Oral Daily  . apixaban  5 mg Oral BID  . calcitRIOL  0.5 mcg Oral Q T,Th,Sat-1800  . carvedilol  6.25 mg Oral BID WC  . darbepoetin (ARANESP) injection - DIALYSIS  150 mcg Intravenous Q Thu-HD  . gabapentin  300 mg Oral Daily  . insulin aspart  0-15 Units Subcutaneous TID WC  . insulin aspart  0-5 Units Subcutaneous QHS  . insulin aspart  5 Units Subcutaneous TID WC  . insulin glargine  25  Units Subcutaneous QHS   Continuous Infusions:   LOS: 5 days        Aline August, MD [facility] Pager [phone]  If 7PM-7AM, please contact night-coverage [email or web address] Password [facility] 06/13/2017, 9:54 AM

## 2017-06-13 NOTE — Progress Notes (Signed)
Patient was angry becouse of slow respond/help when he needs it.

## 2017-06-14 ENCOUNTER — Encounter (HOSPITAL_COMMUNITY): Payer: Self-pay

## 2017-06-14 ENCOUNTER — Inpatient Hospital Stay (HOSPITAL_COMMUNITY): Payer: BLUE CROSS/BLUE SHIELD

## 2017-06-14 ENCOUNTER — Inpatient Hospital Stay (HOSPITAL_COMMUNITY): Payer: BLUE CROSS/BLUE SHIELD | Admitting: Certified Registered"

## 2017-06-14 ENCOUNTER — Encounter (HOSPITAL_COMMUNITY)
Admission: AD | Disposition: A | Discharge status: Skilled Nursing Facility | Payer: Self-pay | Source: Home / Self Care | Attending: Internal Medicine

## 2017-06-14 HISTORY — PX: INCISION AND DRAINAGE ABSCESS: SHX5864

## 2017-06-14 LAB — COMPREHENSIVE METABOLIC PANEL
ALK PHOS: 85 U/L (ref 38–126)
ALT: 10 U/L — ABNORMAL LOW (ref 17–63)
ALT: 14 U/L — AB (ref 17–63)
ANION GAP: 13 (ref 5–15)
AST: 17 U/L (ref 15–41)
AST: 33 U/L (ref 15–41)
Albumin: 1.8 g/dL — ABNORMAL LOW (ref 3.5–5.0)
Albumin: 1.7 g/dL — ABNORMAL LOW (ref 3.5–5.0)
Alkaline Phosphatase: 83 U/L (ref 38–126)
Anion gap: 13 (ref 5–15)
BILIRUBIN TOTAL: 0.7 mg/dL (ref 0.3–1.2)
BILIRUBIN TOTAL: 1.1 mg/dL (ref 0.3–1.2)
BUN: 39 mg/dL — AB (ref 6–20)
BUN: 43 mg/dL — ABNORMAL HIGH (ref 6–20)
CALCIUM: 8 mg/dL — AB (ref 8.9–10.3)
CO2: 24 mmol/L (ref 22–32)
CO2: 22 mmol/L (ref 22–32)
CREATININE: 7 mg/dL — AB (ref 0.61–1.24)
Calcium: 8.2 mg/dL — ABNORMAL LOW (ref 8.9–10.3)
Chloride: 97 mmol/L — ABNORMAL LOW (ref 101–111)
Chloride: 95 mmol/L — ABNORMAL LOW (ref 101–111)
Creatinine, Ser: 6.9 mg/dL — ABNORMAL HIGH (ref 0.61–1.24)
GFR calc Af Amer: 10 mL/min — ABNORMAL LOW (ref >60)
GFR, EST AFRICAN AMERICAN: 10 mL/min — AB (ref >60)
GFR, EST NON AFRICAN AMERICAN: 9 mL/min — AB (ref >60)
GFR, EST NON AFRICAN AMERICAN: 9 mL/min — AB (ref >60)
Glucose, Bld: 94 mg/dL (ref 65–99)
Glucose, Bld: 127 mg/dL — ABNORMAL HIGH (ref 65–99)
POTASSIUM: 4.2 mmol/L (ref 3.5–5.1)
Potassium: 4.2 mmol/L (ref 3.5–5.1)
Sodium: 134 mmol/L — ABNORMAL LOW (ref 135–145)
Sodium: 130 mmol/L — ABNORMAL LOW (ref 135–145)
TOTAL PROTEIN: 5.9 g/dL — AB (ref 6.5–8.1)
TOTAL PROTEIN: 5.4 g/dL — AB (ref 6.5–8.1)

## 2017-06-14 LAB — CBC WITH DIFFERENTIAL/PLATELET
BASOS PCT: 0 %
BASOS PCT: 0 %
Basophils Absolute: 0 10*3/uL (ref 0.0–0.1)
Basophils Absolute: 0 10*3/uL (ref 0.0–0.1)
EOS ABS: 0 10*3/uL (ref 0.0–0.7)
EOS PCT: 0 %
Eosinophils Absolute: 0 10*3/uL (ref 0.0–0.7)
Eosinophils Relative: 0 %
HCT: 25.6 % — ABNORMAL LOW (ref 39.0–52.0)
HEMATOCRIT: 24.7 % — AB (ref 39.0–52.0)
HEMOGLOBIN: 8.1 g/dL — AB (ref 13.0–17.0)
Hemoglobin: 7.5 g/dL — ABNORMAL LOW (ref 13.0–17.0)
LYMPHS ABS: 2.7 10*3/uL (ref 0.7–4.0)
LYMPHS PCT: 7 %
Lymphocytes Relative: 12 %
Lymphs Abs: 1.4 10*3/uL (ref 0.7–4.0)
MCH: 27.2 pg (ref 26.0–34.0)
MCH: 28.2 pg (ref 26.0–34.0)
MCHC: 30.4 g/dL (ref 30.0–36.0)
MCHC: 31.6 g/dL (ref 30.0–36.0)
MCV: 89.5 fL (ref 78.0–100.0)
MCV: 89.2 fL (ref 78.0–100.0)
MONO ABS: 1.6 10*3/uL — AB (ref 0.1–1.0)
Monocytes Absolute: 1.6 10*3/uL — ABNORMAL HIGH (ref 0.1–1.0)
Monocytes Relative: 7 %
Monocytes Relative: 8 %
NEUTROS ABS: 18.3 10*3/uL — AB (ref 1.7–7.7)
NEUTROS ABS: 16.7 10*3/uL — AB (ref 1.7–7.7)
Neutrophils Relative %: 81 %
Neutrophils Relative %: 85 %
PLATELETS: 330 10*3/uL (ref 150–400)
Platelets: 388 10*3/uL (ref 150–400)
RBC: 2.76 MIL/uL — ABNORMAL LOW (ref 4.22–5.81)
RBC: 2.87 MIL/uL — ABNORMAL LOW (ref 4.22–5.81)
RDW: 21.5 % — ABNORMAL HIGH (ref 11.5–15.5)
RDW: 21.7 % — ABNORMAL HIGH (ref 11.5–15.5)
WBC: 22.6 10*3/uL — ABNORMAL HIGH (ref 4.0–10.5)
WBC: 19.7 10*3/uL — ABNORMAL HIGH (ref 4.0–10.5)

## 2017-06-14 LAB — BLOOD CULTURE ID PANEL (REFLEXED)
Acinetobacter baumannii: NOT DETECTED
CANDIDA PARAPSILOSIS: NOT DETECTED
CANDIDA TROPICALIS: NOT DETECTED
Candida albicans: NOT DETECTED
Candida glabrata: NOT DETECTED
Candida krusei: NOT DETECTED
ENTEROCOCCUS SPECIES: NOT DETECTED
Enterobacter cloacae complex: NOT DETECTED
Enterobacteriaceae species: NOT DETECTED
Escherichia coli: NOT DETECTED
Haemophilus influenzae: NOT DETECTED
KLEBSIELLA OXYTOCA: NOT DETECTED
KLEBSIELLA PNEUMONIAE: NOT DETECTED
LISTERIA MONOCYTOGENES: NOT DETECTED
Methicillin resistance: DETECTED — AB
Neisseria meningitidis: NOT DETECTED
PROTEUS SPECIES: NOT DETECTED
Pseudomonas aeruginosa: NOT DETECTED
SERRATIA MARCESCENS: NOT DETECTED
STAPHYLOCOCCUS AUREUS BCID: NOT DETECTED
STAPHYLOCOCCUS SPECIES: DETECTED — AB
Streptococcus agalactiae: NOT DETECTED
Streptococcus pneumoniae: NOT DETECTED
Streptococcus pyogenes: NOT DETECTED
Streptococcus species: NOT DETECTED

## 2017-06-14 LAB — BLOOD GAS, ARTERIAL
Acid-Base Excess: 0.1 mmol/L (ref 0.0–2.0)
Bicarbonate: 23.4 mmol/L (ref 20.0–28.0)
DRAWN BY: 275531
O2 Content: 2 L/min
O2 Saturation: 98.2 %
PATIENT TEMPERATURE: 98.6
PH ART: 7.468 — AB (ref 7.350–7.450)
pCO2 arterial: 32.8 mmHg (ref 32.0–48.0)
pO2, Arterial: 101 mmHg (ref 83.0–108.0)

## 2017-06-14 LAB — LACTIC ACID, PLASMA: LACTIC ACID, VENOUS: 1.8 mmol/L (ref 0.5–1.9)

## 2017-06-14 LAB — GLUCOSE, CAPILLARY
GLUCOSE-CAPILLARY: 94 mg/dL (ref 65–99)
Glucose-Capillary: 101 mg/dL — ABNORMAL HIGH (ref 65–99)
Glucose-Capillary: 68 mg/dL (ref 65–99)
Glucose-Capillary: 119 mg/dL — ABNORMAL HIGH (ref 65–99)
Glucose-Capillary: 124 mg/dL — ABNORMAL HIGH (ref 65–99)
Glucose-Capillary: 120 mg/dL — ABNORMAL HIGH (ref 65–99)

## 2017-06-14 LAB — AMMONIA: AMMONIA: 23 umol/L (ref 9–35)

## 2017-06-14 LAB — MAGNESIUM: MAGNESIUM: 1.6 mg/dL — AB (ref 1.7–2.4)

## 2017-06-14 SURGERY — INCISION AND DRAINAGE, ABSCESS
Anesthesia: General | Site: Buttocks

## 2017-06-14 MED ORDER — FENTANYL CITRATE (PF) 100 MCG/2ML IJ SOLN: 25.0000 ug | INTRAMUSCULAR | Status: DC | PRN

## 2017-06-14 MED ORDER — LIDOCAINE HCL (CARDIAC) 20 MG/ML IV SOLN
INTRAVENOUS | Status: DC | PRN
  Administered 2017-06-14: 50 mg via INTRAVENOUS

## 2017-06-14 MED ORDER — SODIUM CHLORIDE 0.9 % IV BOLUS
500.0000 mL | Freq: Once | INTRAVENOUS | Status: AC
  Administered 2017-06-14: 500 mL via INTRAVENOUS

## 2017-06-14 MED ORDER — OXYCODONE HCL 5 MG/5ML PO SOLN: 5.0000 mg | Freq: Once | ORAL | Status: DC | PRN

## 2017-06-14 MED ORDER — SODIUM CHLORIDE 0.9 % IV SOLN: INTRAVENOUS | Status: DC

## 2017-06-14 MED ORDER — PROPOFOL 10 MG/ML IV BOLUS
INTRAVENOUS | Status: DC | PRN
  Administered 2017-06-14: 100 mg via INTRAVENOUS

## 2017-06-14 MED ORDER — ONDANSETRON HCL 4 MG/2ML IJ SOLN: 4.0000 mg | Freq: Once | INTRAMUSCULAR | Status: DC | PRN

## 2017-06-14 MED ORDER — NEOSTIGMINE METHYLSULFATE 10 MG/10ML IV SOLN
INTRAVENOUS | Status: DC | PRN
  Administered 2017-06-14: 4 mg via INTRAVENOUS

## 2017-06-14 MED ORDER — MIDAZOLAM HCL 2 MG/2ML IJ SOLN
INTRAMUSCULAR | Status: AC
  Filled 2017-06-14: qty 2

## 2017-06-14 MED ORDER — SODIUM CHLORIDE 0.9 % IV SOLN
INTRAVENOUS | Status: DC
  Administered 2017-06-14 (×2): via INTRAVENOUS

## 2017-06-14 MED ORDER — PIPERACILLIN-TAZOBACTAM 3.375 G IVPB
3.3750 g | Freq: Two times a day (BID) | INTRAVENOUS | Status: DC
  Administered 2017-06-14 – 2017-06-15 (×4): 3.375 g via INTRAVENOUS
  Filled 2017-06-14 (×6): qty 50

## 2017-06-14 MED ORDER — GLYCOPYRROLATE 0.2 MG/ML IJ SOLN
INTRAMUSCULAR | Status: DC | PRN
  Administered 2017-06-14: 0.6 mg via INTRAVENOUS

## 2017-06-14 MED ORDER — IOPAMIDOL (ISOVUE-300) INJECTION 61%: 15.0000 mL | INTRAVENOUS | Status: AC

## 2017-06-14 MED ORDER — OXYCODONE HCL 5 MG PO TABS: 5.0000 mg | ORAL_TABLET | Freq: Once | ORAL | Status: DC | PRN

## 2017-06-14 MED ORDER — FENTANYL CITRATE (PF) 100 MCG/2ML IJ SOLN
INTRAMUSCULAR | Status: DC | PRN
  Administered 2017-06-14: 100 ug via INTRAVENOUS

## 2017-06-14 MED ORDER — VANCOMYCIN HCL IN DEXTROSE 1-5 GM/200ML-% IV SOLN
1000.0000 mg | INTRAVENOUS | Status: DC
  Administered 2017-06-15: 1000 mg via INTRAVENOUS
  Filled 2017-06-14 (×2): qty 200

## 2017-06-14 MED ORDER — HYDROCODONE-ACETAMINOPHEN 5-325 MG PO TABS
1.0000 | ORAL_TABLET | Freq: Four times a day (QID) | ORAL | Status: DC | PRN
  Administered 2017-06-22 – 2017-06-28 (×9): 1 via ORAL
  Filled 2017-06-14 (×11): qty 1

## 2017-06-14 MED ORDER — DEXAMETHASONE SODIUM PHOSPHATE 10 MG/ML IJ SOLN
INTRAMUSCULAR | Status: DC | PRN
  Administered 2017-06-14: 5 mg via INTRAVENOUS

## 2017-06-14 MED ORDER — NALOXONE HCL 0.4 MG/ML IJ SOLN
0.4000 mg | INTRAMUSCULAR | Status: DC | PRN
  Administered 2017-06-14: 0.4 mg via INTRAVENOUS
  Administered 2017-06-14: 0.2 mg via INTRAVENOUS
  Filled 2017-06-14 (×2): qty 1

## 2017-06-14 MED ORDER — DEXTROSE 5 % IV SOLN
INTRAVENOUS | Status: DC | PRN
  Administered 2017-06-14: 25 ug/min via INTRAVENOUS

## 2017-06-14 MED ORDER — ROCURONIUM BROMIDE 100 MG/10ML IV SOLN
INTRAVENOUS | Status: DC | PRN
  Administered 2017-06-14: 40 mg via INTRAVENOUS

## 2017-06-14 MED ORDER — VANCOMYCIN HCL 10 G IV SOLR
2000.0000 mg | Freq: Once | INTRAVENOUS | Status: AC
  Administered 2017-06-14: 2000 mg via INTRAVENOUS
  Filled 2017-06-14: qty 2000

## 2017-06-14 MED ORDER — 0.9 % SODIUM CHLORIDE (POUR BTL) OPTIME
TOPICAL | Status: DC | PRN
  Administered 2017-06-14: 1000 mL

## 2017-06-14 MED ORDER — FENTANYL CITRATE (PF) 250 MCG/5ML IJ SOLN
INTRAMUSCULAR | Status: AC
  Filled 2017-06-14: qty 5

## 2017-06-14 MED ORDER — DEXTROSE 5 % IV SOLN
0.0000 ug/min | INTRAVENOUS | Status: DC
Start: 2017-06-14 — End: 2017-06-17
  Filled 2017-06-14: qty 16

## 2017-06-14 SURGICAL SUPPLY — 32 items
BNDG GAUZE ELAST 4 BULKY (GAUZE/BANDAGES/DRESSINGS) ×6 IMPLANT
CANISTER SUCT 3000ML PPV (MISCELLANEOUS) ×3 IMPLANT
COVER SURGICAL LIGHT HANDLE (MISCELLANEOUS) ×3 IMPLANT
DRAIN PENROSE 1/2X12 LTX STRL (WOUND CARE) ×2 IMPLANT
DRAPE LAPAROSCOPIC ABDOMINAL (DRAPES) ×2 IMPLANT
ELECT CAUTERY BLADE 6.4 (BLADE) ×3 IMPLANT
ELECT REM PT RETURN 9FT ADLT (ELECTROSURGICAL) ×3
ELECTRODE REM PT RTRN 9FT ADLT (ELECTROSURGICAL) ×1 IMPLANT
GAUZE SPONGE 4X4 12PLY STRL (GAUZE/BANDAGES/DRESSINGS) IMPLANT
GLOVE BIOGEL PI IND STRL 7.5 (GLOVE) ×1 IMPLANT
GLOVE BIOGEL PI INDICATOR 7.5 (GLOVE) ×2
GLOVE SURG SS PI 7.0 STRL IVOR (GLOVE) ×3 IMPLANT
GOWN STRL REUS W/ TWL LRG LVL3 (GOWN DISPOSABLE) ×2 IMPLANT
GOWN STRL REUS W/TWL LRG LVL3 (GOWN DISPOSABLE) ×6
KIT BASIN OR (CUSTOM PROCEDURE TRAY) ×3 IMPLANT
KIT TURNOVER KIT B (KITS) ×3 IMPLANT
NEEDLE 22X1 1/2 (OR ONLY) (NEEDLE) ×3 IMPLANT
NS IRRIG 1000ML POUR BTL (IV SOLUTION) ×5 IMPLANT
PACK SURGICAL SETUP 50X90 (CUSTOM PROCEDURE TRAY) ×3 IMPLANT
PAD ABD 8X10 STRL (GAUZE/BANDAGES/DRESSINGS) ×8 IMPLANT
PAD ARMBOARD 7.5X6 YLW CONV (MISCELLANEOUS) ×5 IMPLANT
PENCIL BUTTON HOLSTER BLD 10FT (ELECTRODE) ×3 IMPLANT
SUT SILK 3 0 SH 30 (SUTURE) ×2 IMPLANT
SWAB COLLECTION DEVICE MRSA (MISCELLANEOUS) ×2 IMPLANT
SWAB CULTURE ESWAB REG 1ML (MISCELLANEOUS) IMPLANT
SYR 5ML LL (SYRINGE) ×2 IMPLANT
TAPE CLOTH SURG 4X10 WHT LF (GAUZE/BANDAGES/DRESSINGS) ×2 IMPLANT
TOWEL OR 17X24 6PK STRL BLUE (TOWEL DISPOSABLE) ×3 IMPLANT
TOWEL OR 17X26 10 PK STRL BLUE (TOWEL DISPOSABLE) ×3 IMPLANT
TUBE CONNECTING 12'X1/4 (SUCTIONS) ×1
TUBE CONNECTING 12X1/4 (SUCTIONS) ×2 IMPLANT
YANKAUER SUCT BULB TIP NO VENT (SUCTIONS) ×3 IMPLANT

## 2017-06-14 NOTE — Progress Notes (Signed)
Physical Therapy Wound Treatment Patient Details  Name: John Bailey MRN: [id number] Date of Birth: [birth year]  Today's Date: 06/14/2017 Time: 8828-0034 Time Calculation (min): 55 min  Subjective  Subjective: Pt with fever and had limited response to questions Patient and Family Stated Goals: heal wound decrease odor  Pain Score:  Pt refused premedication. However experienced 8/10 pain with treatment and requested pain medication post treatment. Nursing notified.   Wound Assessment  Pressure Injury 05/28/17 Unstageable - Full thickness tissue loss in which the base of the ulcer is covered by slough (yellow, tan, gray, green or brown) and/or eschar (tan, brown or black) in the wound bed. Central gluteal fold (Active)  Dressing Type ABD;Moist to dry 06/14/2017  2:00 PM  Dressing Changed 06/14/2017  2:00 PM  Dressing Change Frequency Twice a day 06/13/2017  8:00 AM  State of Healing Early/partial granulation 06/14/2017  2:00 PM  Site / Wound Assessment Painful;Pink;Yellow;Brown;Dusky;Black;Bleeding 06/14/2017  2:00 PM  % Wound base Red or Granulating 70% 06/14/2017  2:00 PM  % Wound base Yellow/Fibrinous Exudate 30% 06/14/2017  2:00 PM  Peri-wound Assessment Erythema (blanchable);Pink 06/14/2017  2:00 PM  Wound Length (cm) 11 cm 06/09/2017  5:00 PM  Wound Width (cm) 5.5 cm 06/09/2017  5:00 PM  Wound Depth (cm) 5 cm 06/09/2017  5:00 PM  Wound Surface Area (cm^2) 60.5 cm^2 06/09/2017  5:00 PM  Wound Volume (cm^3) 302.5 cm^3 06/09/2017  5:00 PM  Tunneling (cm) 6 cm at 12 o'clock 06/02/2017 11:00 AM  Margins Unattached edges (unapproximated) 06/14/2017  2:00 PM  Drainage Amount Moderate 06/14/2017  2:00 PM  Drainage Description Odor;Purulent;Serous 06/14/2017  2:00 PM  Treatment Debridement (Selective);Hydrotherapy (Pulse lavage);Packing (Saline gauze) 06/14/2017  2:00 PM   Wound packed with Dakins soaked gauze   Wound / Incision (Open or Dehisced) 05/28/17 Non-pressure wound Sacrum Right;Left;Medial R buttock  (Active)  Dressing Type Gauze (Comment);Moist to dry;Other (Comment) 06/14/2017  2:00 PM  Dressing Changed Changed 06/14/2017  2:00 PM  Dressing Status Clean;Intact 06/14/2017  2:00 PM  Dressing Change Frequency Twice a day 06/14/2017  2:00 PM  Site / Wound Assessment Granulation tissue;Yellow;Painful 06/14/2017  2:00 PM  % Wound base Red or Granulating 70% 06/14/2017  2:00 PM  % Wound base Yellow/Fibrinous Exudate 30% 06/14/2017  2:00 PM  Peri-wound Assessment Denuded;Pink;Induration 06/14/2017  2:00 PM  Wound Length (cm) 3.5 cm 06/09/2017  5:00 PM  Wound Width (cm) 2.5 cm 06/09/2017  5:00 PM  Wound Surface Area (cm^2) 8.75 cm^2 06/09/2017  5:00 PM  Margins Unattached edges (unapproximated) 06/14/2017  2:00 PM  Closure None 06/14/2017  2:00 PM  Drainage Amount Moderate 06/11/2017 11:49 PM  Drainage Description Serous 06/14/2017  2:00 PM  Treatment Cleansed 06/14/2017  2:00 PM   Hydrotherapy Pulsed lavage therapy - wound location: sacrum Pulsed Lavage with Suction (psi): 12 psi Pulsed Lavage with Suction - Normal Saline Used: 1000 mL Pulsed Lavage Tip: Tip with splash shield Selective Debridement Selective Debridement - Location: sacrum  Selective Debridement - Tools Used: Forceps;Scissors Selective Debridement - Tissue Removed: brown, gray yellow, necrotic tissue.    Wound Assessment and Plan  Wound Therapy - Assess/Plan/Recommendations Wound Therapy - Clinical Statement: Pt with fever on entry. Pt continues to have moderate amount of purulent drainage from tunneled area between 3 and 4 o'clock especially with pressure to the R buttocks. Surgical PA present prior to thrapy and requested another 2 days of Dakins solution to cut down on infection.  Factors Delaying/Impairing Wound Healing: Immobility;Multiple medical  problems;Diabetes Mellitus Hydrotherapy Plan: Debridement;Patient/family education;Pulsatile lavage with suction Wound Therapy - Frequency: 6X / week Wound Therapy - Current Recommendations:  Surgery consult;WOC nurse Wound Therapy - Follow Up Recommendations: Other (comment) Wound Plan: see above  Wound Therapy Goals- Improve the function of patient's integumentary system by progressing the wound(s) through the phases of wound healing (inflammation - proliferation - remodeling) by: Decrease Necrotic Tissue to: 50 Decrease Necrotic Tissue - Progress: Progressing toward goal Increase Granulation Tissue to: 50 Increase Granulation Tissue - Progress: Progressing toward goal Goals/treatment plan/discharge plan were made with and agreed upon by patient/family: Yes Time For Goal Achievement: 7 days Wound Therapy - Potential for Goals: Fair  Goals will be updated until maximal potential achieved or discharge criteria met.  Discharge criteria: when goals achieved, discharge from hospital, MD decision/surgical intervention, no progress towards goals, refusal/missing three consecutive treatments without notification or medical reason.  GP    John Bailey. [name] PT, DPT Acute Rehabilitation  [phone] Pager [phone]   [place] 06/14/2017, 2:27 PM

## 2017-06-14 NOTE — Progress Notes (Signed)
Occupational Therapy Treatment Patient Details Name: John Bailey MRN: [id number] DOB: 1970-05-28 Today's Date: 06/14/2017    History of present illness 47 year old male with history of RA, PVD, hypertension, end-stage renal disease on hemodialysis, A. fib on Eliquis, diabetes and chronic diastolic CHF and sacral and coccyx pressure ulcer who was admitted from 04/22/2017-05/13/2017 with anasarca from acute on chronic diastolic heart failure and A. fib with RVR (cardioverted) and was discharged to inpatient rehab for debility.  He underwent debridement of sacral decubitus ulcer on 06/04/2017 by general surgery.  Pt was supposed to discharge from CIR on 06/09/2017 but general surgery recommended continued inpatient wound care, patient was readmitted on 06/09/2017.   OT comments  Pt is significantly weaker with lethargy today. He required 2 person total assist for bed mobility. Sitting balance at EOB was poor with heavy lean posterior and toward the L and tolerated x 5 minutes. Hopefully this is an isolated day and pt will progress to home, but will continue to assess and update plan as appropriate.  Follow Up Recommendations  Home health OT;Supervision/Assistance - 24 hour(may need to update plan depending on progress)    Equipment Recommendations       Recommendations for Other Services      Precautions / Restrictions Precautions Precautions: Fall Precaution Comments: Severe RA in shoulders, left hand Required Braces or Orthoses: Other Brace/Splint Other Brace/Splint: R ankle brace Restrictions Weight Bearing Restrictions: No       Mobility Bed Mobility Overal bed mobility: Needs Assistance Bed Mobility: Rolling;Sidelying to Sit;Sit to Sidelying Rolling: Total assist Sidelying to sit: Total assist;+2 for physical assistance     Sit to sidelying: Total assist;+2 for physical assistance General bed mobility comments: max multimodal cues to attend to task, required total assist +2 to  roll pt with bed pad, bring LEs off/on EOB and elevate/lower trunk. max assist to maintian sitting at EOB for 5 min.  Transfers                 General transfer comment: unable this session due to decline in pt status    Balance Overall balance assessment: Needs assistance Sitting-balance support: Feet supported;Bilateral upper extremity supported Sitting balance-Leahy Scale: Poor Sitting balance - Comments: max assist to maintain sitting balance with left lateral lean and posterior bias. pt able to ackowledge lean but unable to correct without physical assist                                   ADL either performed or assessed with clinical judgement   ADL Overall ADL's : Needs assistance/impaired     Grooming: Minimal assistance;Sitting;Wash/dry face           Upper Body Dressing : Maximal assistance;Bed level                     General ADL Comments: per RN, pt with fever today     Vision       Perception     Praxis      Cognition Arousal/Alertness: Lethargic Behavior During Therapy: Flat affect Overall Cognitive Status: Impaired/Different from baseline Area of Impairment: Attention;Memory;Following commands;Awareness;Problem solving                   Current Attention Level: Focused Memory: Decreased short-term memory Following Commands: Follows one step commands inconsistently;Follows one step commands with increased time   Awareness: Intellectual Problem Solving: Slow processing;Decreased  initiation;Difficulty sequencing;Requires verbal cues;Requires tactile cues General Comments: pt extremely lethargic max multimodal cues to remain awake and focus on tasks, he appears to startle frequently. pt stating he already brushed his teeth and washed up this morning, but did not per nurse tech.        Exercises     Shoulder Instructions       General Comments pt diaphoretic and hot to touch, pt's friend Kasandra Knudsen present and  supportive during session    Pertinent Vitals/ Pain       Pain Assessment: Faces Faces Pain Scale: Hurts even more Pain Location: generalized Pain Descriptors / Indicators: Discomfort;Grimacing Pain Intervention(s): Repositioned;Monitored during session;Limited activity within patient's tolerance  Home Living                                          Prior Functioning/Environment              Frequency  Min 2X/week        Progress Toward Goals  OT Goals(current goals can now be found in the care plan section)  Progress towards OT goals: Not progressing toward goals - comment(lethargy)  Acute Rehab OT Goals Patient Stated Goal: none stated Time For Goal Achievement: 06/23/17 Potential to Achieve Goals: Good  Plan Discharge plan remains appropriate(May need to update d/c recommendations depending on progress)    Co-evaluation    PT/OT/SLP Co-Evaluation/Treatment: Yes Reason for Co-Treatment: For patient/therapist safety PT goals addressed during session: Mobility/safety with mobility;Balance OT goals addressed during session: Strengthening/ROM      AM-PAC PT "6 Clicks" Daily Activity     Outcome Measure   Help from another person eating meals?: A Lot Help from another person taking care of personal grooming?: A Lot Help from another person toileting, which includes using toliet, bedpan, or urinal?: Total Help from another person bathing (including washing, rinsing, drying)?: Total Help from another person to put on and taking off regular upper body clothing?: A Lot Help from another person to put on and taking off regular lower body clothing?: Total 6 Click Score: 9    End of Session    OT Visit Diagnosis: Muscle weakness (generalized) (M62.81);Other abnormalities of gait and mobility (R26.89);Other symptoms and signs involving cognitive function   Activity Tolerance Patient limited by lethargy;Patient limited by fatigue   Patient Left in  bed;with call bell/phone within reach;with family/visitor present   Nurse Communication Mobility status        Time: 1010-1029 OT Time Calculation (min): 19 min  Charges: OT General Charges $OT Visit: 1 Visit OT Treatments $Therapeutic Activity: 8-22 mins  06/14/2017 Nestor Lewandowsky, OTR/L Pager: [phone] Werner Lean Haze [place] 06/14/2017, 11:51 AM

## 2017-06-14 NOTE — Op Note (Signed)
Preoperative diagnosis: right gluteal abscess  Postoperative diagnosis: same   Procedure: debridement of 70cm^2 tissue involving subcutaneous tissue, fascia and muscle of right gluteal area  Surgeon: Gurney Maxin, M.D.  Asst: none  Anesthesia: general  Indications for procedure: John Bailey is a 48 y.o. year old male with symptoms of fever, hypotension concerning for sepsis and was found to have a gas within the right gluteal area. He was taken urgently to the operating room for debridement.  Description of procedure: The patient was brought into the operative suite. Anesthesia was administered with General endotracheal anesthesia. WHO checklist was applied. The patient was then placed in prone position. The sacral wound packing was removed. The area was prepped and draped in the usual sterile fashion. Upon positioning the patient prone a large amount of feculent material was expressed from the patient's anus and continued to come throughout the majority of the procedure. Fortunately, it did not get into the wounds during the case.  Next, the area of maximal induration was incised with cautery. Cautery was kept high to avoid major bleeding given recent eliquis. Purulence was identified about 5cm below the skin. A large elliptical area of skin and subcutaneous tissue was removed and a large area of dead muscle tissue and green purulent fluid was identified. The dead muscle was debridement, part sent for pathology, some of the purulence was sent for culture. The gluteal abscess had a small tunnel connecting the previous sacral wound. It also tracked inferiorly toward the thigh and the debridement was extended in that direction to debride more infected tissue. Digital dissection was used to identify an inferior reach and a counterincision was made and a 1/2" penrose brought through the counterincision and sutured to itself with 3-0 silk. Hemostasis was applied to the base of the wound with  cautery. The wound was irrigated with 1l warm saline. The final dimensions of the wound was 10cm by 7cm with a depth of 4cm and at least 5cm diameter portion of gluteal muscle involved at the base. The gluteal wound and sacral wound were packed with salinated kerlex. The patient tolerated the procedure well and was brought to pacu in stable condition.  Findings: abscess tracking along right gluteal muscle with dead muscle tissue  Specimen: gluteal muscle for pathology, gluteal abscess for culture  Implant: 1/2" penrose drain   Blood loss: 21ml  Local anesthesia: none  Complications: none  Gurney Maxin, M.D. General, Bariatric, & Minimally Invasive Surgery [facility]

## 2017-06-14 NOTE — Progress Notes (Signed)
  [facility] Progress Note    Subjective:   Unable to arouse pt this afternoon.  His wife states that he was like that when she came in around 1:30pm   Objective:   BP (!) 101/53   Pulse 87   Temp 99.1 F (37.3 C) (Oral)   Resp 18   Ht 6' (1.829 m)   Wt 108.4 kg (239 lb)   SpO2 93%   BMI 32.41 kg/m   Intake/Output: I/O last 3 completed shifts: In: 480 [P.O.:480] Out: 0    Intake/Output this shift:  No intake/output data recorded. Weight change: -1.89 kg (-4 lb 2.7 oz)  Physical Exam: Gen: lethargic and minimally arousable, able to open his eyes but goes back to sleep CVS: no rub Resp: scattered rhonchi Abd: benign Ext: trace edema, left cimino avf with faint thrill, + bruit.  Labs: BMET Recent Labs  Lab 06/08/17 6295 06/09/17 0604 06/10/17 0508 06/11/17 0437 06/12/17 0358 06/14/17 0752  NA 131* 133* 133* 132* 135 134*  K 4.5 4.4 4.8 3.9 4.4 4.2  CL 96* 97* 99* 96* 97* 97*  CO2 20* 22 22 25 24 24   GLUCOSE 420* 341* 344* 323* 142* 94  BUN 79* 45* 65* 38* 54* 39*  CREATININE 7.15* 4.87* 6.30* 4.76* 6.19* 6.90*  ALBUMIN 2.4*  --   --   --   --  1.8*  CALCIUM 8.7* 8.6* 8.4* 8.3* 8.7* 8.2*  PHOS 6.4*  --   --   --   --   --    CBC Recent Labs  Lab 06/10/17 0508 06/11/17 0437 06/12/17 0358 06/14/17 0752  WBC 20.5* 26.0* 24.5* 22.6*  NEUTROABS 17.7* 23.2* 21.8* 18.3*  HGB 7.9* 8.0* 8.9* 7.5*  HCT 24.8* 26.0* 29.2* 24.7*  MCV 88.9 88.7 88.8 89.5  PLT 286 297 343 388    @IMGRELPRIORS @ Medications:    . amiodarone  200 mg Oral Daily  . calcitRIOL  0.5 mcg Oral Q T,Th,Sat-1800  . darbepoetin (ARANESP) injection - DIALYSIS  150 mcg Intravenous Q Thu-HD  . insulin aspart  0-15 Units Subcutaneous TID WC  . insulin aspart  0-5 Units Subcutaneous QHS  . insulin aspart  5 Units Subcutaneous TID WC  . insulin glargine  25 Units Subcutaneous QHS     Assessment/ Plan:   1. AMS- pt is very lethargic and unable to arouse today.  CBG and  ABG normal.  Ammonia level pending as well as CT scan of head.  No narcotics since 8:30 am.  2. ESRD continue with HD q TTS while he remains an inpatient 3. Anemia: on ESA 4. CKD-MBD: cont with vit D will need binders and to follow phos when able to take po 5. Nutrition: renal diet 6. Hypertension: stable and continue with UF. 7. Vascular access- LAVF placed 05/10/17, RIJ tdc 8. Decubitus ulcers- s/p debridement 06/04/17.  Continue with wound care 9. A fib- per primary 10. Rheumatoid arthritis flare- on prednisone taper.  Donetta Potts, MD [place] Pager [phone] 06/14/2017, 3:57 PM

## 2017-06-14 NOTE — Progress Notes (Signed)
Patient ID: John Bailey, male   DOB: [birth year], 47 y.o.   MRN: [id number]  PROGRESS NOTE    John Bailey  WFU:[id number] DOB: 03-Feb-1971 DOA: 06/08/2017 PCP: [name], MD   Brief Narrative:  47 year old male with history of RA, PVD, hypertension, end-stage renal disease on hemodialysis, A. fib on Eliquis, diabetes and chronic diastolic CHF and sacral and coccyx pressure ulcer who was admitted from 04/22/2017-05/13/2017 with anasarca from acute on chronic diastolic heart failure and A. fib with RVR (cardioverted) and was discharged to inpatient rehab for debility.  He underwent debridement of sacral decubitus ulcer on 06/04/2017 by general surgery.  He was supposed to be discharged from [facility] on 06/09/2017 but general surgery recommended continued inpatient wound care so patient was readmitted on 06/09/2017.   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  General surgery following and Zosyn has been started by general surgery.  CT of the abdomen pelvis has been ordered by general surgery.  Will follow -Still spiking temperatures.  Blood cultures from 05/14/2017 are pending.  Unstageable Sacral ulcer status post debridement -Debridement was done by Dr. Grandville Silos on 06/04/2017.   -General surgery following.  Patient might need more debridement  depending on the CAT scan results -Still undergoing hydrotherapy  ESRD on HD  -Nephrology following.  Dialysis as per nephrology schedule.  Hypertension -Blood pressure on the lower side.  Will hold Coreg.  Monitor  Type II Diabetes  -Continue current dose of Levemir along with NovoLog with meals.  Continue Accu-Cheks with coverage.  Leukocytosis, likely reactive, related to underlying sacral wound and steroids in the setting of RA   -Improving.  Repeat a.m. labs.  Antibiotic plan as above  Anemia of chronic disease  -Hemoglobin stable.  Monitor.  No signs of bleeding.  Atrial Fibrillation CHA2DS2-VASc score 2-3 , on anticoagulation with Eliquis. Echo 04/25/17  EF 20-25, normal systolic  -Status post recent cardioversion during last hospitalization.  Currently rate controlled Continue amiodarone.  Hold Coreg.  Hold Eliquis for probable surgery   Chronic diastolic heart failure.   -Recent hospitalization for acute decompensated heart failure. -Currently compensated and volume is managed by dialysis.  Hold Coreg.  Outpatient follow-up with cardiology  Rheumatoid arthritis with flare Continue 6-week prednisone taper which was started on 05/19/2017. outpatient follow-up with rheumatology.  Patient had been receiving abatacept every Tuesday prior to admission; will hold it for now.  Deconditioning  -Continue PT/OT  DVT prophylaxis:  Eliquis  Code Status:    Full  Family Communication:   None at bedside Disposition Plan: Probable home once clinically improved and cleared by general surgery  Consultants: General surgery/nephrology  Procedures: None  Antimicrobials: None   Subjective: Patient seen and examined at bedside.  Patient denies overnight worsening cough, shortness of breath, diarrhea or abdominal pain.  No current chest pain   objective: Vitals:   06/14/17 0500  06/14/17 0750 06/14/17 0759 06/14/17 0948  BP: (!) 96/53 (!) 82/46 (!) 102/47   Pulse: 96 (!) 103      Resp: 18 18    Temp: 98 F (36.7 C) (!) 103.1 F (39.5 C)  (!) 101.5 F (38.6 C)  TempSrc: Oral Oral  Oral  SpO2: 97% 96%    Weight:      Height:        Intake/Output Summary (Last 24 hours) at 06/14/2017 1117 Last data filed at 06/14/2017 0654 Gross per 24 hour  Intake 240 ml  Output 0 ml  Net 240 ml   Filed Weights   06/12/17 1129 06/12/17 2120 06/13/17 2114  Weight: 108.4 kg (238 lb 15.7 oz) 108.4 kg (238 lb 15.7 oz) 108.4 kg (239 lb)    Examination:  General exam: No distress.  Calm and comfortable  respiratory system: Bilateral decreased breath sounds at bases cardiovascular system: S1 S2 heard, rate controlled gastrointestinal system: Abdomen is obese, nondistended, soft and nontender. Normal bowel sounds heard. Central nervous system: Alert awake, no focal neurologic deficit.  Moving extremities Extremities: No cyanosis; trace edema Lymph: No cervical lymphadenopathy Psych: Normal mood and affect  Data Reviewed: I have personally reviewed following labs and imaging studies  CBC: Recent Labs  Lab 06/09/17 0604 06/10/17 0508 06/11/17 0437 06/12/17 0358 06/14/17 0752  WBC 19.6* 20.5* 26.0* 24.5* 22.6*  NEUTROABS  --  17.7* 23.2* 21.8* PENDING  HGB 8.5* 7.9* 8.0* 8.9* 7.5*  HCT 26.6* 24.8* 26.0* 29.2* 24.7*  MCV 89.0 88.9 88.7 88.8 89.5  PLT 288 286 297 343 419   Basic Metabolic Panel: Recent Labs  Lab 06/08/17 0832 06/09/17 0604 06/10/17 0508 06/11/17 0437 06/12/17 0358 06/14/17 0752  NA 131* 133* 133* 132* 135 134*  K 4.5 4.4 4.8 3.9 4.4 4.2  CL 96* 97* 99* 96* 97* 97*  CO2 20* 22 22 25 24 24   GLUCOSE 420* 341* 344* 323* 142* 94  BUN 79* 45* 65* 38* 54* 39*  CREATININE 7.15* 4.87* 6.30* 4.76* 6.19* 6.90*  CALCIUM 8.7* 8.6* 8.4* 8.3* 8.7* 8.2*  MG  --   --  1.7 1.7 1.7 1.6*  PHOS 6.4*  --   --   --   --   --    GFR: Estimated Creatinine Clearance: 17.4 mL/min (A) (by C-G formula based on SCr of 6.9 mg/dL (H)). Liver Function Tests: Recent  Labs  Lab 06/08/17 0832 06/14/17 0752  AST  --  17  ALT  --  10*  ALKPHOS  --  83  BILITOT  --  0.7  PROT  --  5.9*  ALBUMIN 2.4* 1.8*   No results for input(s): LIPASE, AMYLASE in the last 168 hours. No results for input(s): AMMONIA in the last 168 hours. Coagulation Profile: No results for input(s): INR, PROTIME in the last 168 hours. Cardiac Enzymes: No results for input(s): CKTOTAL, CKMB, CKMBINDEX, TROPONINI in the last 168 hours. BNP (last 3 results) No results for input(s): PROBNP in the last 8760 hours. HbA1C: No results for input(s): HGBA1C in the last 72 hours. CBG: Recent Labs  Lab 06/13/17 0806 06/13/17 1232 06/13/17 1649 06/13/17 2112 06/14/17 0740  GLUCAP 129* 161* 185* 109* 101*   Lipid Profile: No results for input(s): CHOL, HDL, LDLCALC, TRIG, CHOLHDL, LDLDIRECT in the last 72 hours. Thyroid Function Tests: No results for input(s): TSH, T4TOTAL, FREET4, T3FREE, THYROIDAB in the last 72 hours. Anemia Panel: No results for input(s): VITAMINB12, FOLATE, FERRITIN, TIBC, IRON, RETICCTPCT  in the last 72 hours. Sepsis Labs: No results for input(s): PROCALCITON, LATICACIDVEN in the last 168 hours.  Recent Results (from the past 240 hour(s))  MRSA PCR Screening     Status: None   Collection Time: 06/08/17  6:37 PM  Result Value Ref Range Status   MRSA by PCR NEGATIVE NEGATIVE Final    Comment:        The GeneXpert MRSA Assay (FDA approved for NASAL specimens only), is one component of a comprehensive MRSA colonization surveillance program. It is not intended to diagnose MRSA infection nor to guide or monitor treatment for MRSA infections. Performed at [facility], [place] [address] [place], [place] [address]          Radiology Studies: No results found.      Scheduled Meds: . amiodarone  200 mg Oral Daily  . calcitRIOL  0.5 mcg Oral Q T,Th,Sat-1800  . carvedilol  6.25 mg Oral BID WC  . darbepoetin (ARANESP) injection - DIALYSIS   150 mcg Intravenous Q Thu-HD  . gabapentin  300 mg Oral Daily  . insulin aspart  0-15 Units Subcutaneous TID WC  . insulin aspart  0-5 Units Subcutaneous QHS  . insulin aspart  5 Units Subcutaneous TID WC  . insulin glargine  25 Units Subcutaneous QHS   Continuous Infusions: . piperacillin-tazobactam (ZOSYN)  IV       LOS: 6 days        Aline August, MD [facility] Pager [phone]  If 7PM-7AM, please contact night-coverage [email or web address] Password [email or web address] 06/14/2017, 11:17 AM

## 2017-06-14 NOTE — Anesthesia Postprocedure Evaluation (Signed)
Anesthesia Post Note  Patient: John Bailey  Procedure(s) Performed: INCISION AND DRAINAGE GLUTEAL ABSCESS (N/A Buttocks)     Patient location during evaluation: PACU Anesthesia Type: General Level of consciousness: patient cooperative and sedated Pain management: pain level controlled Vital Signs Assessment: post-procedure vital signs reviewed and stable Respiratory status: spontaneous breathing, nonlabored ventilation and respiratory function stable Cardiovascular status: blood pressure returned to baseline and stable Postop Assessment: no apparent nausea or vomiting Anesthetic complications: no Comments: BP at preoperative baseline. Sleepy, but arousable as in preop    Last Vitals:  Vitals:   06/14/17 2130 06/14/17 2137  BP:  (!) 91/49  Pulse: 82 83  Resp: 17 16  Temp:    SpO2: 100% 100%    Last Pain:  Vitals:   06/14/17 2122  TempSrc:   PainSc: [place]

## 2017-06-14 NOTE — Transfer of Care (Addendum)
Immediate Anesthesia Transfer of Care Note  Patient: John Bailey  Procedure(s) Performed: INCISION AND DRAINAGE GLUTEAL ABSCESS (N/A Buttocks)  Patient Location: PACU  Anesthesia Type:General  Level of Consciousness: drowsy and responds to stimulation  Airway & Oxygen Therapy: Patient Spontanous Breathing and Patient connected to nasal cannula oxygen  Post-op Assessment: Report given to RN and Post -op Vital signs reviewed and stable  Post vital signs: Reviewed and stable  Last Vitals:  Vitals Value Taken Time  BP 135/63 06/14/2017  8:39 PM  Temp 37.3 C 06/14/2017  8:39 PM  Pulse 113 06/14/2017  8:43 PM  Resp 12 06/14/2017  8:43 PM  SpO2 91 % 06/14/2017  8:43 PM  Vitals shown include unvalidated device data.  Last Pain:  Vitals:   06/14/17 1702  TempSrc: Axillary  PainSc:       Patients Stated Pain Goal: 3 (98/92/11 9417)  Complications: No apparent anesthesia complications   Cognition same as pre-op.  Opens eyes to verbal stimuli. RR even and unlabored.  No evidence of pain or discomfort at present.

## 2017-06-14 NOTE — Progress Notes (Addendum)
Pharmacy Antibiotic Note  John Bailey is a 47 y.o. male admitted on 06/08/2017 with sacral decubitus ulcer on hydrotherapy.  Pharmacy has been consulted for Zosyn dosing for fever and purulent drainage from wound. Patient has ESRD and is on HD TTS therefore Zosyn dosing will not change for this patient.  Plan: Zosyn 3.375 g IV q12h (infuse over 4 hours) for ESRD Pharmacy signing off but will monitor peripherally and recommend any changes to therapy   Height: 6' (182.9 cm) Weight: 239 lb (108.4 kg) IBW/kg (Calculated) : 77.6  Temp (24hrs), Avg:100.1 F (37.8 C), Min:98 F (36.7 C), Max:103.1 F (39.5 C)  Recent Labs  Lab 06/09/17 0604 06/10/17 0508 06/11/17 0437 06/12/17 0358 06/14/17 0752  WBC 19.6* 20.5* 26.0* 24.5* 22.6*  CREATININE 4.87* 6.30* 4.76* 6.19* 6.90*    Estimated Creatinine Clearance: 17.4 mL/min (A) (by C-G formula based on SCr of 6.9 mg/dL (H)).    No Known Allergies   Thank you for allowing pharmacy to be a part of this patient's care.   Renold Genta, PharmD, BCPS Clinical Pharmacist Clinical phone for 06/14/2017 until 4p is [phone] After 4p, please call Main Rx at [phone] for assistance 06/14/2017 10:58 AM    Addendum: Adding vancomycin for decubitus ulcer also.  Vancomycin 2000 mg IV load then 1000 mg IV after HD TTS Monitor HD schedule and tolerance  Renold Genta, PharmD, BCPS Clinical Pharmacist 06/14/2017 12:51 PM

## 2017-06-14 NOTE — Progress Notes (Addendum)
Patient ID: John Bailey, male   DOB: Feb 07, 1971, 47 y.o.   MRN: [id number] Patient has had persistent temperature spikes this morning.  Later this afternoon, patient started to become hypotensive with drowsiness.  I ordered 500 cc normal saline bolus.  Stat ABG did not show hypercapnia.  Patient also received a dose of intravenous Narcan.  CT abdomen pelvis ordered by general surgery showed probable abscess in the gluteus muscle.  Patient still remains hypotensive.  We will give 500 cc normal saline bolus.  Vancomycin has already been added along with Zosyn.  Blood cultures from 06/13/2017 are pending.  Transfer the patient to stepdown unit.  Monitor blood pressures closely along with mental status.  Respiratory status stable.  Chest x-ray is pending.  Spoke to Dr. Callie Fielding surgeon and updated about the CT scan result.  General surgery will reevaluate the patient soon today.  We will keep him n.p.o. till then.

## 2017-06-14 NOTE — Progress Notes (Signed)
Inpatient Diabetes Program Recommendations  AACE/ADA: New Consensus Statement on Inpatient Glycemic Control (2015)  Target Ranges:  Prepandial:   less than 140 mg/dL      Peak postprandial:   less than 180 mg/dL (1-2 hours)      Critically ill patients:  140 - 180 mg/dL   Lab Results  Component Value Date   GLUCAP 94 06/14/2017   HGBA1C 6.9 (H) 06/08/2017    Review of Glycemic ControlResults for LIONEL, WOODBERRY" (MRN [id number]) as of 06/14/2017 15:28  Ref. Range 06/14/2017 07:40 06/14/2017 11:39 06/14/2017 14:22  Glucose-Capillary Latest Ref Range: 65 - 99 mg/dL 101 (H) 68 94    Diabetes history: Type 2 DM Outpatient Diabetes medications:  Lantus 30 units q HS Current orders for Inpatient glycemic control:  Novolog moderate tid with meals and HS, Lantus 25 units q HS, Novolog 5 units tid with meals  Inpatient Diabetes Program Recommendations:   Note blood sugars lower.  Consider reducing Lantus to 20 units daily and reduce Novolog meal coverage to 3 units tid with meals.   Thanks, Adah Perl, RN, BC-ADM Inpatient Diabetes Coordinator Pager [phone] (8a-5p)

## 2017-06-14 NOTE — Progress Notes (Signed)
  [facility] Progress Note     Subjective: CC- fever Patient febrile and more lethargic this morning. Currently undergoing hydrotherapy.  Objective: Vital signs in last 24 hours: Temp:  [98 F (36.7 C)-103.1 F (39.5 C)] 101.5 F (38.6 C) (04/01 0948) Pulse Rate:  [96-103] 103 (04/01 0750) Resp:  [18] 18 (04/01 0750) BP: (82-113)/(46-56) 102/47 (04/01 0759) SpO2:  [94 %-97 %] 96 % (04/01 0750) Weight:  [239 lb (108.4 kg)] 239 lb (108.4 kg) (03/31 2114) Last BM Date: 06/14/17  Intake/Output from previous day: 03/31 0701 - 04/01 0700 In: 480 [P.O.:480] Out: 0  Intake/Output this shift: No intake/output data recorded.  PE: Gen:  Alert, NAD HEENT: EOM's intact, pupils equal and round Pulm:  effort normal GU: purulent drainage noted from distal aspect of wound     Lab Results:  Recent Labs    06/12/17 0358  WBC 24.5*  HGB 8.9*  HCT 29.2*  PLT 343   BMET Recent Labs    06/12/17 0358  NA 135  K 4.4  CL 97*  CO2 24  GLUCOSE 142*  BUN 54*  CREATININE 6.19*  CALCIUM 8.7*   PT/INR No results for input(s): LABPROT, INR in the last 72 hours. CMP     Component Value Date/Time   NA 135 06/12/2017 0358   K 4.4 06/12/2017 0358   CL 97 (L) 06/12/2017 0358   CO2 24 06/12/2017 0358   GLUCOSE 142 (H) 06/12/2017 0358   BUN 54 (H) 06/12/2017 0358   CREATININE 6.19 (H) 06/12/2017 0358   CALCIUM 8.7 (L) 06/12/2017 0358   PROT 5.9 (L) 05/05/2017 0430   ALBUMIN 2.4 (L) 06/08/2017 0832   AST 12 (L) 05/05/2017 0430   ALT 12 (L) 05/22/2017 1822   ALKPHOS 74 05/05/2017 0430   BILITOT 0.6 05/05/2017 0430   GFRNONAA 10 (L) 06/12/2017 0358   GFRAA 11 (L) 06/12/2017 0358   Lipase  No results found for: LIPASE     Studies/Results: No results found.  Anti-infectives: Anti-infectives (From admission, onward)   None       Assessment/Plan AF/DVT - on Eliquis DCHF/NICM ESRD HD - TTS Diabetes ID - peripheral neuropathy Hypertension RA - on  steroids Deconditioning- cardiorenal syndrome   Sacral ulcer S/pDEBRIDMENT OF SACRAL DECUBITUS ULCER [id number] Dr. Grandville Silos 3/22 -POD10 - surgical path:DENSELY INFLAMED AND NECROTIC FIBROADIPOSE TISSUE,THERE IS NO EVIDENCE OF MALIGNANCY - TMAX 103, labs pending  ID -ancef perioperative FEN -renal diet VTE -eliquis Foley -none  Plan:Patient with fever and purulent drainage from wound. Check CT abdomen pelvis to evaluate for any undrained fluid collection. Hold eliquis in case patient needs to return to the OR tomorrow. Start broad spectrum antibiotics (zosyn). Continue BID wet to dry dressing changes and hydrotherapy.   LOS: 6 days    Wellington Hampshire , [facility] 06/14/2017, 10:10 AM Pager: [phone] Consults: [phone] Mon-Fri 7:00 am-4:30 pm Sat-Sun 7:00 am-11:30 am

## 2017-06-14 NOTE — Progress Notes (Signed)
PHARMACY - PHYSICIAN COMMUNICATION CRITICAL VALUE ALERT - BLOOD CULTURE IDENTIFICATION (BCID)  John Bailey is an 47 y.o. male who presented to [facility] on 06/08/2017 with a chief complaint of sacral wound.  Assessment:   47 yo M with sacral wound presents from home. Also is ESRD. Has had I&Ds in the past of wound. Have been using hydrotherapy. Then had fever so cx's drawn and started on Zosyn and now vancomycin added. 1/4 blood cx is positive for staph species, mec A detected. Probably contaminant unless grows from other cx's as well. Fevers may still be from sacral wound.  Name of physician (or Provider) Contacted: K. Alekh  Current antibiotics: Zosyn and vancomycin  Changes to prescribed antibiotics recommended: Continue current abx until source identified. May need to pull HD catheter if continues to have fevers with no other source. Recommendations accepted by provider  Results for orders placed or performed during the hospital encounter of 06/08/17  Blood Culture ID Panel (Reflexed) (Collected: 06/13/2017 11:26 AM)  Result Value Ref Range   Enterococcus species NOT DETECTED NOT DETECTED   Listeria monocytogenes NOT DETECTED NOT DETECTED   Staphylococcus species DETECTED (A) NOT DETECTED   Staphylococcus aureus NOT DETECTED NOT DETECTED   Methicillin resistance DETECTED (A) NOT DETECTED   Streptococcus species NOT DETECTED NOT DETECTED   Streptococcus agalactiae NOT DETECTED NOT DETECTED   Streptococcus pneumoniae NOT DETECTED NOT DETECTED   Streptococcus pyogenes NOT DETECTED NOT DETECTED   Acinetobacter baumannii NOT DETECTED NOT DETECTED   Enterobacteriaceae species NOT DETECTED NOT DETECTED   Enterobacter cloacae complex NOT DETECTED NOT DETECTED   Escherichia coli NOT DETECTED NOT DETECTED   Klebsiella oxytoca NOT DETECTED NOT DETECTED   Klebsiella pneumoniae NOT DETECTED NOT DETECTED   Proteus species NOT DETECTED NOT DETECTED   Serratia marcescens NOT DETECTED NOT  DETECTED   Haemophilus influenzae NOT DETECTED NOT DETECTED   Neisseria meningitidis NOT DETECTED NOT DETECTED   Pseudomonas aeruginosa NOT DETECTED NOT DETECTED   Candida albicans NOT DETECTED NOT DETECTED   Candida glabrata NOT DETECTED NOT DETECTED   Candida krusei NOT DETECTED NOT DETECTED   Candida parapsilosis NOT DETECTED NOT DETECTED   Candida tropicalis NOT DETECTED NOT DETECTED    Reginia Naas 06/14/2017  3:44 PM

## 2017-06-14 NOTE — Anesthesia Procedure Notes (Signed)
Procedure Name: Intubation Date/Time: 06/14/2017 7:31 PM Performed by: Teressa Lower., CRNA Pre-anesthesia Checklist: Patient identified, Emergency Drugs available, Suction available and Patient being monitored Patient Re-evaluated:Patient Re-evaluated prior to induction Oxygen Delivery Method: Circle system utilized Preoxygenation: Pre-oxygenation with 100% oxygen Induction Type: IV induction Ventilation: Two handed mask ventilation required Laryngoscope Size: Glidescope and 3 Grade View: Grade I Tube type: Oral Tube size: 7.5 mm Number of attempts: 1 Airway Equipment and Method: Stylet,  Video-laryngoscopy and Oral airway Placement Confirmation: ETT inserted through vocal cords under direct vision,  positive ETCO2 and breath sounds checked- equal and bilateral Secured at: 23 cm Dental Injury: Teeth and Oropharynx as per pre-operative assessment  Difficulty Due To: Difficulty was anticipated, Difficult Airway- due to limited oral opening, Difficult Airway- due to reduced neck mobility and Difficult Airway- due to large tongue

## 2017-06-14 NOTE — Progress Notes (Signed)
Physical Therapy Treatment Patient Details Name: John Bailey MRN: [id number] DOB: 1971-01-19 Today's Date: 06/14/2017    History of Present Illness 47 year old male with history of RA, PVD, hypertension, end-stage renal disease on hemodialysis, A. fib on Eliquis, diabetes and chronic diastolic CHF and sacral and coccyx pressure ulcer who was admitted from 04/22/2017-05/13/2017 with anasarca from acute on chronic diastolic heart failure and A. fib with RVR (cardioverted) and was discharged to inpatient rehab for debility.  He underwent debridement of sacral decubitus ulcer on 06/04/2017 by general surgery.  Pt was supposed to discharge from CIR on 06/09/2017 but general surgery recommended continued inpatient wound care, patient was readmitted on 06/09/2017.    PT Comments    Pt with significant decline in functional mobility and cognition compared to previous session. Pt requires significant +2 physical assist for bed mobility and sitting at EOB. Pt extremely lethargic requiring max multimodal cues to attend to tasks.  Initial plan was for pt to return home, but may need more intensive level of therapy upon d/c if pt continues to present at current level of function. D/c recommendations and goals updated to reflect current decline in status. Pt remains extremely motivated stating "let's walk," but unsafe to do so at this time. Will continue to follow acutely and progress as tolerated.   Follow Up Recommendations  CIR     Equipment Recommendations  Other (comment)(defer to next venue of care)    Recommendations for Other Services       Precautions / Restrictions Restrictions Weight Bearing Restrictions: No    Mobility  Bed Mobility Overal bed mobility: Needs Assistance Bed Mobility: Sidelying to Sit;Sit to Sidelying   Sidelying to sit: Total assist;+2 for physical assistance     Sit to sidelying: Total assist;+2 for physical assistance General bed mobility comments: max multimodal cues  to attend to task, required total assist +2 to roll pt with bed pad, bring LEs off/on EOB and elevate/lower trunk. max assist to maintian sitting at EOB for 5 min.  Transfers                 General transfer comment: unable this session due to decline in pt status  Ambulation/Gait                 Stairs            Wheelchair Mobility    Modified Rankin (Stroke Patients Only)       Balance Overall balance assessment: Needs assistance Sitting-balance support: Feet supported;Bilateral upper extremity supported Sitting balance-Leahy Scale: Poor Sitting balance - Comments: max assist to maintain sitting balance with left lateral lean and posterior bias. pt able to ackowledge lean but unable to correct without physical assist                                    Cognition Arousal/Alertness: Lethargic Behavior During Therapy: Flat affect Overall Cognitive Status: Impaired/Different from baseline Area of Impairment: Attention;Memory;Following commands;Awareness;Problem solving                   Current Attention Level: Focused Memory: Decreased short-term memory Following Commands: Follows one step commands inconsistently;Follows one step commands with increased time   Awareness: Intellectual Problem Solving: Slow processing;Decreased initiation;Difficulty sequencing;Requires verbal cues;Requires tactile cues General Comments: pt extremely lethargic max multimodal cues to remain awake and focus on tasks, he appears to startle frequently. pt stating he already  brushed his teeth and washed up this morning, but did not per nurse tech.      Exercises      General Comments General comments (skin integrity, edema, etc.): pt diaphoretic and hot to touch, pt's friend John Bailey present and supportive during session      Pertinent Vitals/Pain Pain Assessment: Faces Faces Pain Scale: Hurts even more Pain Location: none stated Pain Descriptors /  Indicators: Discomfort;Grimacing Pain Intervention(s): Limited activity within patient's tolerance;Monitored during session;Repositioned    Home Living                      Prior Function            PT Goals (current goals can now be found in the care plan section) Acute Rehab PT Goals Patient Stated Goal: none stated PT Goal Formulation: With patient Time For Goal Achievement: 06/28/17 Potential to Achieve Goals: Fair Progress towards PT goals: Not progressing toward goals - comment;Goals downgraded-see care plan    Frequency    Min 3X/week      PT Plan Discharge plan needs to be updated    Co-evaluation PT/OT/SLP Co-Evaluation/Treatment: Yes Reason for Co-Treatment: Complexity of the patient's impairments (multi-system involvement);For patient/therapist safety;To address functional/ADL transfers PT goals addressed during session: Mobility/safety with mobility;Balance        AM-PAC PT "6 Clicks" Daily Activity  Outcome Measure  Difficulty turning over in bed (including adjusting bedclothes, sheets and blankets)?: Unable Difficulty moving from lying on back to sitting on the side of the bed? : Unable Difficulty sitting down on and standing up from a chair with arms (e.g., wheelchair, bedside commode, etc,.)?: Unable Help needed moving to and from a bed to chair (including a wheelchair)?: Total Help needed walking in hospital room?: Total Help needed climbing 3-5 steps with a railing? : Total 6 Click Score: 6    End of Session   Activity Tolerance: Patient limited by lethargy;Patient limited by fatigue Patient left: in bed;with call bell/phone within reach;with family/visitor present Nurse Communication: Mobility status PT Visit Diagnosis: Other abnormalities of gait and mobility (R26.89);Muscle weakness (generalized) (M62.81);Pain Pain - part of body: (generalized)     Time: 1011-1030 PT Time Calculation (min) (ACUTE ONLY): 19 min  Charges:   $Therapeutic Activity: 8-22 mins                    G Codes:       John Bailey, SPT   John Bailey 06/14/2017, 11:45 AM

## 2017-06-14 NOTE — Progress Notes (Signed)
Patient increasingly somnolent, high fevers, continued leukocytosis, CT concerning for undrained collection in right gluteal area. I spoke with his wife that this undrained area has a high likelihood for being the infection causing these problems and needs to undergo urgent drainage. We discussed that he has been on eliquis up to mid day today and this increases his risk for bleeding, however, given his septic appearance I do no think this procedure should be delayed. -OR today for incision and drainage of right gluteal abscess

## 2017-06-14 NOTE — Progress Notes (Signed)
Kami RN called to make me aware nephrology at bedside concerned about change in pt's mental status, new orders placed. On my arrival pt responding to sternal rub, alert to self and location but quickly goes back to sleep. Does not fully engage in conversation. Narcan 0.4 mg IVP given with no change. Transported to CT for scan of head/abd/pelvis. Pt more responsive. CCS to evaluate pt for possible surgery. Transport to CT. MD's at bedside, no interventions from me.

## 2017-06-15 ENCOUNTER — Encounter (HOSPITAL_COMMUNITY): Payer: Self-pay | Admitting: General Surgery

## 2017-06-15 DIAGNOSIS — D72829 Elevated white blood cell count, unspecified: Secondary | ICD-10-CM

## 2017-06-15 DIAGNOSIS — L0231 Cutaneous abscess of buttock: Secondary | ICD-10-CM

## 2017-06-15 LAB — COMPREHENSIVE METABOLIC PANEL
ALK PHOS: 96 U/L (ref 38–126)
ALT: 16 U/L — ABNORMAL LOW (ref 17–63)
ANION GAP: 14 (ref 5–15)
AST: 23 U/L (ref 15–41)
Albumin: 1.7 g/dL — ABNORMAL LOW (ref 3.5–5.0)
BILIRUBIN TOTAL: 0.8 mg/dL (ref 0.3–1.2)
BUN: 48 mg/dL — ABNORMAL HIGH (ref 6–20)
CALCIUM: 8.4 mg/dL — AB (ref 8.9–10.3)
CO2: 21 mmol/L — ABNORMAL LOW (ref 22–32)
Chloride: 96 mmol/L — ABNORMAL LOW (ref 101–111)
Creatinine, Ser: 7.53 mg/dL — ABNORMAL HIGH (ref 0.61–1.24)
GFR calc non Af Amer: 8 mL/min — ABNORMAL LOW (ref >60)
GFR, EST AFRICAN AMERICAN: 9 mL/min — AB (ref >60)
Glucose, Bld: 172 mg/dL — ABNORMAL HIGH (ref 65–99)
Potassium: 5.1 mmol/L (ref 3.5–5.1)
SODIUM: 131 mmol/L — AB (ref 135–145)
Total Protein: 5.7 g/dL — ABNORMAL LOW (ref 6.5–8.1)

## 2017-06-15 LAB — GLUCOSE, CAPILLARY
GLUCOSE-CAPILLARY: 158 mg/dL — AB (ref 65–99)
GLUCOSE-CAPILLARY: 124 mg/dL — AB (ref 65–99)
Glucose-Capillary: 171 mg/dL — ABNORMAL HIGH (ref 65–99)
Glucose-Capillary: 122 mg/dL — ABNORMAL HIGH (ref 65–99)

## 2017-06-15 LAB — CBC WITH DIFFERENTIAL/PLATELET
BASOS ABS: 0 10*3/uL (ref 0.0–0.1)
Basophils Relative: 0 %
Eosinophils Absolute: 0 10*3/uL (ref 0.0–0.7)
Eosinophils Relative: 0 %
HCT: 23.3 % — ABNORMAL LOW (ref 39.0–52.0)
HEMOGLOBIN: 7.1 g/dL — AB (ref 13.0–17.0)
LYMPHS ABS: 0.9 10*3/uL (ref 0.7–4.0)
Lymphocytes Relative: 4 %
MCH: 27.1 pg (ref 26.0–34.0)
MCHC: 30.5 g/dL (ref 30.0–36.0)
MCV: 88.9 fL (ref 78.0–100.0)
MONO ABS: 0.4 10*3/uL (ref 0.1–1.0)
MONOS PCT: 2 %
Neutro Abs: 20.4 10*3/uL — ABNORMAL HIGH (ref 1.7–7.7)
Neutrophils Relative %: 94 %
Platelets: 357 10*3/uL (ref 150–400)
RBC: 2.62 MIL/uL — ABNORMAL LOW (ref 4.22–5.81)
RDW: 21.4 % — ABNORMAL HIGH (ref 11.5–15.5)
WBC: 21.7 10*3/uL — AB (ref 4.0–10.5)

## 2017-06-15 LAB — C-REACTIVE PROTEIN: CRP: 32.3 mg/dL — AB (ref <1.0)

## 2017-06-15 LAB — MAGNESIUM: Magnesium: 1.6 mg/dL — ABNORMAL LOW (ref 1.7–2.4)

## 2017-06-15 LAB — MRSA PCR SCREENING: MRSA BY PCR: NEGATIVE

## 2017-06-15 MED ORDER — VANCOMYCIN HCL IN DEXTROSE 1-5 GM/200ML-% IV SOLN
INTRAVENOUS | Status: AC
  Filled 2017-06-15: qty 200

## 2017-06-15 NOTE — Progress Notes (Signed)
  [facility] Progress Note    Subjective:   Events of last 24 hours noted.  Feels much better, more awake and alert   Objective:   BP 118/68 (BP Location: Right Arm)   Pulse 73   Temp 98.2 F (36.8 C) (Oral)   Resp (!) 23   Ht 6' (1.829 m)   Wt 128.4 kg (283 lb)   SpO2 100%   BMI 38.38 kg/m   Intake/Output: I/O last 3 completed shifts: In: 299.2 [I.V.:199.2; IV Piggyback:100] Out: 0    Intake/Output this shift:  No intake/output data recorded. Weight change: 20 kg (44 lb)  Physical Exam: Gen:NAD CVS: no rub Resp: cta Abd: benign Ext: minimal edema, LAVF +T/B  Labs: BMET Recent Labs  Lab 06/09/17 0604 06/10/17 0508 06/11/17 0437 06/12/17 0358 06/14/17 0752 06/14/17 1644 06/15/17 0240  NA 133* 133* 132* 135 134* 130* 131*  K 4.4 4.8 3.9 4.4 4.2 4.2 5.1  CL 97* 99* 96* 97* 97* 95* 96*  CO2 22 22 25 24 24 22  21*  GLUCOSE 341* 344* 323* 142* 94 127* 172*  BUN 45* 65* 38* 54* 39* 43* 48*  CREATININE 4.87* 6.30* 4.76* 6.19* 6.90* 7.00* 7.53*  ALBUMIN  --   --   --   --  1.8* 1.7* 1.7*  CALCIUM 8.6* 8.4* 8.3* 8.7* 8.2* 8.0* 8.4*   CBC Recent Labs  Lab 06/12/17 0358 06/14/17 0752 06/14/17 1644 06/15/17 0240  WBC 24.5* 22.6* 19.7* 21.7*  NEUTROABS 21.8* 18.3* 16.7* 20.4*  HGB 8.9* 7.5* 8.1* 7.1*  HCT 29.2* 24.7* 25.6* 23.3*  MCV 88.8 89.5 89.2 88.9  PLT 343 388 330 357    @IMGRELPRIORS @ Medications:    . amiodarone  200 mg Oral Daily  . calcitRIOL  0.5 mcg Oral Q T,Th,Sat-1800  . darbepoetin (ARANESP) injection - DIALYSIS  150 mcg Intravenous Q Thu-HD  . insulin aspart  0-15 Units Subcutaneous TID WC  . insulin aspart  0-5 Units Subcutaneous QHS  . insulin aspart  5 Units Subcutaneous TID WC  . insulin glargine  25 Units Subcutaneous QHS     Assessment/ Plan:   1. AMS due to gluteal abscess and early sepsis- s/p I&D of abscess with marked improvement in BP and mentation.  He is more arousable today.  Appreciate surgery's  assistance.  2. ESRD continue with HD q TTS while he remains an inpatient. Plan for HD today due to hyperkalemia but will keep even due to borderline BP and recent abscess drainage and poor po intake. 3. Anemia: on ESA 4. CKD-MBD: cont with vit D will need binders and to follow phos when able to take po 5. Nutrition: renal diet 6. Hypertension: stable and continue with UF. 7. Vascular access- LAVF placed 05/10/17, RIJ tdc 8. Decubitus ulcers- s/p debridement 06/04/17.  Continue with wound care 9. A fib- per primary 10. Rheumatoid arthritis flare- on prednisone taper.   [place], MD [place] Pager [phone] 06/15/2017, 10:48 AM

## 2017-06-15 NOTE — Consult Note (Signed)
[facility] Nurse wound consult note Patient was evaluated by surgical team this AM at the bedside.  Orders for packing to be removed in the AM to evaluate the need for additional surgical debridement.  Ongoing odor present.  Will await surgical team decision.  [place] team will remain available.  Domenic Moras RN BSN [place] Pager [phone]

## 2017-06-15 NOTE — Progress Notes (Addendum)
1 Day Post-Op    CC:  Sacral decubitus/right gluteal abscess  Subjective: He can't really turn on his own in bed, dressing is dry. I took the external dressing down, cleaned the skin and redressed with ABD.  Sites look OK this AM. We will take the packing out in the AM and decide on the need to return to the OR.     Objective: Vital signs in last 24 hours: Temp:  [98.1 F (36.7 C)-103.1 F (39.5 C)] 98.1 F (36.7 C) (04/02 0426) Pulse Rate:  [71-113] 71 (04/02 0426) Resp:  [14-20] 20 (04/02 0426) BP: (63-136)/(27-99) 100/58 (04/02 0426) SpO2:  [93 %-100 %] 100 % (04/02 0426) Weight:  [128.4 kg (283 lb)] 128.4 kg (283 lb) (04/02 0500) Last BM Date: 06/14/17 NPO 300 IV recorded No urine Stool x 1 Afebrile, VSS last fever 0700 yesterday 101.5; BP up this AM Na 131 Glucose 172 Creatinine 7.53 Mag 1.6 CRP 32.3 WBC pending  H/H 7.1/23.3   Intake/Output from previous day: 04/01 0701 - 04/02 0700 In: 299.2 [I.V.:199.2; IV Piggyback:100] Out: 0  Intake/Output this shift: No intake/output data recorded.  General appearance: alert, cooperative and no distress Skin: Cleaned external skin around wounds, redressed.  No excessive drainage or odor.    Lab Results:  Recent Labs    06/14/17 1644 06/15/17 0240  WBC 19.7* PENDING  HGB 8.1* 7.1*  HCT 25.6* 23.3*  PLT 330 PENDING    BMET Recent Labs    06/14/17 1644 06/15/17 0240  NA 130* 131*  K 4.2 5.1  CL 95* 96*  CO2 22 21*  GLUCOSE 127* 172*  BUN 43* 48*  CREATININE 7.00* 7.53*  CALCIUM 8.0* 8.4*   PT/INR No results for input(s): LABPROT, INR in the last 72 hours.  Recent Labs  Lab 06/08/17 0832 06/14/17 0752 06/14/17 1644 06/15/17 0240  AST  --  17 33 23  ALT  --  10* 14* 16*  ALKPHOS  --  83 85 96  BILITOT  --  0.7 1.1 0.8  PROT  --  5.9* 5.4* 5.7*  ALBUMIN 2.4* 1.8* 1.7* 1.7*     Lipase  No results found for: LIPASE   Medications: . amiodarone  200 mg Oral Daily  . calcitRIOL  0.5 mcg Oral  Q T,Th,Sat-1800  . darbepoetin (ARANESP) injection - DIALYSIS  150 mcg Intravenous Q Thu-HD  . insulin aspart  0-15 Units Subcutaneous TID WC  . insulin aspart  0-5 Units Subcutaneous QHS  . insulin aspart  5 Units Subcutaneous TID WC  . insulin glargine  25 Units Subcutaneous QHS   . sodium chloride 50 mL/hr at 06/14/17 1830  . sodium chloride    . norepinephrine (LEVOPHED) Adult infusion    . piperacillin-tazobactam (ZOSYN)  IV Stopped (06/15/17 0500)  . vancomycin     Anti-infectives (From admission, onward)   Start     Dose/Rate Route Frequency Ordered Stop   06/15/17 1200  vancomycin (VANCOCIN) IVPB 1000 mg/200 mL premix     1,000 mg 200 mL/hr over 60 Minutes Intravenous Every T-Th-Sa (Hemodialysis) 06/14/17 1256     06/14/17 1330  vancomycin (VANCOCIN) 2,000 mg in sodium chloride 0.9 % 500 mL IVPB     2,000 mg 250 mL/hr over 120 Minutes Intravenous  Once 06/14/17 1256 06/14/17 2229   06/14/17 1130  piperacillin-tazobactam (ZOSYN) IVPB 3.375 g     3.375 g 12.5 mL/hr over 240 Minutes Intravenous Every 12 hours 06/14/17 1057       .  sodium chloride 50 mL/hr at 06/14/17 1830  . sodium chloride    . norepinephrine (LEVOPHED) Adult infusion    . piperacillin-tazobactam (ZOSYN)  IV Stopped (06/15/17 0500)  . 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 Grandville Silos 3/22 -POD1 - surgical path:DENSELY INFLAMED AND NECROTIC FIBROADIPOSE TISSUE,THERE IS NO EVIDENCE OF MALIGNANCY -TMAX 103, CT 4/1 =>> right gluteal abscess=>> debridement of 70cm^2 tissue involving subcutaneous tissue, fascia and muscle of right gluteal area, 06/14/17 Dr. Lurena Joiner Kinsinger    ID -ancef perioperative 3/22; Vancomycin/Zosyn 4/1 =>> day 2 FEN -renal  diet VTE -eliquis on hold Foley -none  Plan:  He can eat from our standpoint.  I will make him NPO after MN and we will decide on taking back to OR in AM for second look or doing dressing change at bedside.  He should have HD today.   Cancel hydrotherapy till we look at dressing tomorrow.    LOS: 7 days    JENNINGS,WILLARD 06/15/2017 [phone]

## 2017-06-15 NOTE — Progress Notes (Signed)
Patient ID: John Bailey, male   DOB: [birth year], 47 y.o.   MRN: [id number]  PROGRESS NOTE    John Bailey  YKZ:[id number] DOB: Feb 23, 1971 DOA: 06/08/2017 PCP: [name], MD   Brief Narrative:  47 year old male with history of RA, PVD, hypertension, end-stage renal disease on hemodialysis, A. fib on Eliquis, diabetes and chronic diastolic CHF and sacral and coccyx pressure ulcer who was admitted from 04/22/2017-05/13/2017 with anasarca from acute on chronic diastolic heart failure and A. fib with RVR (cardioverted) and was discharged to inpatient rehab for debility.  He underwent debridement of sacral decubitus ulcer on 06/04/2017 by general surgery.  He was supposed to be discharged from [facility] on 06/09/2017 but general surgery recommended continued inpatient wound care so patient was readmitted on 06/09/2017.   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  Continue vancomycin and Zosyn until then.  Follow general surgery recommendations.  Patient might need further surgical debridement. -Currently afebrile  Acute metabolic encephalopathy -Patient was very drowsy yesterday probably secondary to above.  Mental status has much improved.  Monitor mental status.  CT scan of the head was  negative for acute abnormality on 06/14/2017  Unstageable Sacral ulcer status post debridement -Debridement was done by Dr. Grandville Silos on 06/04/2017.   -General surgery following.  -Hydrotherapy will be on hold until further surgery recommendations  ESRD on HD  -Nephrology following.  Dialysis as per nephrology schedule.  Hypertension -Blood pressure on the lower side.  Coreg on hold.  Monitor  Type II Diabetes  -Continue current dose of Levemir along with NovoLog with meals.  Continue Accu-Cheks with coverage.  Leukocytosis, likely reactive, related to underlying sacral wound and steroids in the setting of RA   - Repeat a.m. labs.  Antibiotic plan as above  Anemia of chronic disease  -Hemoglobin stable.  Monitor.  No signs of bleeding.  Atrial Fibrillation CHA2DS2-VASc score 2-3 , on anticoagulation with Eliquis. Echo 04/25/17  EF 93-90, normal systolic  -Status post recent cardioversion during last hospitalization.  Currently rate controlled Continue amiodarone.  Hold Coreg.  Keep Eliquis on hold until cleared by general surgery.  Chronic diastolic heart failure.   -Recent hospitalization for acute decompensated heart failure. -Currently compensated and volume is managed by dialysis.  Hold Coreg.  Outpatient follow-up with cardiology  Rheumatoid arthritis with flare Continue 6-week prednisone taper which was started on 05/19/2017. outpatient follow-up with rheumatology.  Patient had been receiving abatacept every Tuesday prior to admission; will hold it for now.  Deconditioning  -Continue PT/OT  DVT prophylaxis:  Eliquis  on hold for now Code Status:    Full  Family Communication:   Spoke to wife at bedside Disposition Plan: Probable home once clinically improved and cleared by general surgery  Consultants: General surgery/nephrology  Procedures: None  Antimicrobials: None   Subjective:  Patient seen and examined at bedside.  Patient is more awake and answering  questions.  No overnight fever, nausea or vomiting.  objective: Vitals:   06/15/17 0426 06/15/17 0500 06/15/17 0716 06/15/17 1116  BP: (!) 100/58  118/68   Pulse: 71  73 76  Resp: 20  (!) 23 (!) 23  Temp: 98.1 F (36.7 C)  98.2 F (36.8 C)   TempSrc: Oral  Oral   SpO2: 100%  100% 96%  Weight:  128.4 kg (283 lb)    Height:        Intake/Output Summary (Last 24 hours) at 06/15/2017 1136 Last data filed at 06/15/2017 1100 Gross per 24 hour  Intake 1165 ml  Output 0 ml  Net 1165 ml   Filed Weights   06/13/17 2114 06/14/17 2226 06/15/17 0500  Weight: 108.4 kg (239 lb) 128.4 kg (283 lb) 128.4 kg (283 lb)    Examination:  General exam: No distress. respiratory system: Bilateral decreased breath sounds at bases.  No wheezing cardiovascular system: Rate controlled, S1-S2 positive gastrointestinal system: Abdomen is obese, nondistended, soft and nontender. Normal bowel sounds heard. Central nervous system: Awake, answering questions.  No focal neurological deficit.  Moving extremities Extremities: No cyanosis; trace edema Lymph: No cervical lymphadenopathy Psych: Normal mood and affect  Data Reviewed: I have personally reviewed following labs and imaging studies  CBC: Recent Labs  Lab 06/11/17 0437 06/12/17 0358 06/14/17 0752 06/14/17 1644 06/15/17 0240  WBC 26.0* 24.5* 22.6* 19.7* 21.7*  NEUTROABS 23.2* 21.8* 18.3* 16.7* 20.4*  HGB 8.0* 8.9* 7.5* 8.1* 7.1*  HCT 26.0* 29.2* 24.7* 25.6* 23.3*  MCV 88.7 88.8 89.5 89.2 88.9  PLT 297 343 388 330 542   Basic Metabolic Panel: Recent Labs  Lab 06/10/17 0508 06/11/17 0437 06/12/17 0358 06/14/17 0752 06/14/17 1644 06/15/17 0240  NA 133* 132* 135 134* 130* 131*  K 4.8 3.9 4.4 4.2 4.2 5.1  CL 99* 96* 97* 97* 95* 96*  CO2 22 25 24 24 22  21*  GLUCOSE 344* 323* 142* 94 127* 172*  BUN 65* 38* 54* 39* 43* 48*  CREATININE 6.30* 4.76* 6.19* 6.90* 7.00* 7.53*  CALCIUM 8.4* 8.3* 8.7* 8.2* 8.0* 8.4*  MG 1.7 1.7 1.7 1.6*  --   1.6*   GFR: Estimated Creatinine Clearance: 17.3 mL/min (A) (by C-G formula based on SCr of 7.53 mg/dL (H)). Liver Function Tests: Recent Labs  Lab 06/14/17 0752 06/14/17 1644 06/15/17 0240  AST 17 33 23  ALT 10* 14* 16*  ALKPHOS 83 85 96  BILITOT 0.7 1.1 0.8  PROT 5.9* 5.4* 5.7*  ALBUMIN 1.8* 1.7* 1.7*   No results for input(s): LIPASE, AMYLASE in the last 168 hours. Recent Labs  Lab 06/14/17 1644  AMMONIA 23   Coagulation Profile: No results for input(s): INR, PROTIME in the last 168 hours. Cardiac Enzymes: No results for input(s): CKTOTAL, CKMB, CKMBINDEX, TROPONINI in the last 168 hours. BNP (last 3 results) No results for input(s): PROBNP in the last 8760 hours. HbA1C: No results for input(s): HGBA1C in the last 72 hours. CBG: Recent Labs  Lab 06/14/17 1422 06/14/17 1701 06/14/17 1820 06/14/17 2039 06/15/17 0817  GLUCAP 94 119* 124* 120* 171*   Lipid Profile: No results for input(s): CHOL, HDL, LDLCALC, TRIG, CHOLHDL, LDLDIRECT in the last 72 hours. Thyroid Function Tests: No results for input(s): TSH, T4TOTAL, FREET4, T3FREE, THYROIDAB in the last 72 hours. Anemia Panel: No results for input(s): VITAMINB12, FOLATE, FERRITIN, TIBC, IRON, RETICCTPCT in the last 72  hours. Sepsis Labs: Recent Labs  Lab 06/14/17 1644  LATICACIDVEN 1.8    Recent Results (from the past 240 hour(s))  MRSA PCR Screening     Status: None   Collection Time: 06/08/17  6:37 PM  Result Value Ref Range Status   MRSA by PCR NEGATIVE NEGATIVE Final    Comment:        The GeneXpert MRSA Assay (FDA approved for NASAL specimens only), is one component of a comprehensive MRSA colonization surveillance program. It is not intended to diagnose MRSA infection nor to guide or monitor treatment for MRSA infections. Performed at [facility], [place] [address] [place], [place] [address]   Culture, blood (routine x 2)     Status: None (Preliminary result)   Collection Time:  06/13/17 11:26 AM  Result Value Ref Range Status   Specimen Description BLOOD RIGHT HAND  Final   Special Requests   Final    IN BOTH AEROBIC AND ANAEROBIC BOTTLES Blood Culture adequate volume   Culture   Final    NO GROWTH 2 DAYS Performed at [facility], [place] [address], [place], [place] [address]    Report Status PENDING  Incomplete  Culture, blood (routine x 2)     Status: Abnormal (Preliminary result)   Collection Time: 06/13/17 11:26 AM  Result Value Ref Range Status   Specimen Description BLOOD RIGHT HAND  Final   Special Requests   Final    IN BOTH AEROBIC AND ANAEROBIC BOTTLES Blood Culture adequate volume   Culture  Setup Time   Final    GRAM POSITIVE COCCI ANAEROBIC BOTTLE ONLY CRITICAL RESULT CALLED TO, READ BACK BY AND VERIFIED WITH: Karlene Einstein PharmD 14:40 06/14/17 ([email or web address])    Culture (A)  Final    STAPHYLOCOCCUS SPECIES (COAGULASE NEGATIVE) THE SIGNIFICANCE OF ISOLATING THIS ORGANISM FROM A SINGLE SET OF BLOOD CULTURES WHEN MULTIPLE SETS ARE DRAWN IS UNCERTAIN. PLEASE NOTIFY THE MICROBIOLOGY DEPARTMENT WITHIN ONE WEEK IF SPECIATION AND SENSITIVITIES ARE REQUIRED. Performed at [facility], [place] [address], [place], [place] [address]    Report Status PENDING  Incomplete  Blood Culture ID Panel (Reflexed)     Status: Abnormal   Collection Time: 06/13/17 11:26 AM  Result Value Ref Range Status   Enterococcus species NOT DETECTED NOT DETECTED Final   Listeria monocytogenes NOT DETECTED NOT DETECTED Final   Staphylococcus species DETECTED (A) NOT DETECTED Final    Comment: Methicillin (oxacillin) resistant coagulase negative staphylococcus. Possible blood culture contaminant (unless isolated from more than one blood culture draw or clinical case suggests pathogenicity). No antibiotic treatment is indicated for blood  culture contaminants. CRITICAL RESULT CALLED TO, READ BACK BY AND VERIFIED WITH: Karlene Einstein PharmD 14:40 06/14/17 ([email or web address])     Staphylococcus aureus NOT DETECTED NOT DETECTED Final   Methicillin resistance DETECTED (A) NOT DETECTED Final    Comment: CRITICAL RESULT CALLED TO, READ BACK BY AND VERIFIED WITH: Karlene Einstein PharmD 14:40 06/14/17 ([email or web address])    Streptococcus species NOT DETECTED NOT DETECTED Final   Streptococcus agalactiae NOT DETECTED NOT DETECTED Final   Streptococcus pneumoniae NOT DETECTED NOT DETECTED Final   Streptococcus pyogenes NOT DETECTED NOT DETECTED Final   Acinetobacter baumannii NOT DETECTED NOT DETECTED Final   Enterobacteriaceae species NOT DETECTED NOT DETECTED Final   Enterobacter cloacae complex NOT DETECTED NOT DETECTED Final   Escherichia coli NOT DETECTED NOT DETECTED Final   Klebsiella oxytoca NOT DETECTED NOT DETECTED Final   Klebsiella pneumoniae NOT  DETECTED NOT DETECTED Final   Proteus species NOT DETECTED NOT DETECTED Final   Serratia marcescens NOT DETECTED NOT DETECTED Final   Haemophilus influenzae NOT DETECTED NOT DETECTED Final   Neisseria meningitidis NOT DETECTED NOT DETECTED Final   Pseudomonas aeruginosa NOT DETECTED NOT DETECTED Final   Candida albicans NOT DETECTED NOT DETECTED Final   Candida glabrata NOT DETECTED NOT DETECTED Final   Candida krusei NOT DETECTED NOT DETECTED Final   Candida parapsilosis NOT DETECTED NOT DETECTED Final   Candida tropicalis NOT DETECTED NOT DETECTED Final    Comment: Performed at [facility], [place] [address], [place], [place] [address]  Aerobic/Anaerobic Culture (surgical/deep wound)     Status: None (Preliminary result)   Collection Time: 06/14/17  7:52 PM  Result Value Ref Range Status   Specimen Description ABSCESS GLUTEAL  Final   Special Requests PATIENT ON FOLLOWING ZOSYN AND VANC  Final   Gram Stain   Final    FEW WBC PRESENT, PREDOMINANTLY PMN FEW GRAM POSITIVE COCCI FEW GRAM NEGATIVE RODS RARE GRAM POSITIVE RODS    Culture   Final    TOO YOUNG TO READ Performed at [facility], [place] [address],  [place], [place] [address]    Report Status PENDING  Incomplete  MRSA PCR Screening     Status: None   Collection Time: 06/15/17 12:48 AM  Result Value Ref Range Status   MRSA by PCR NEGATIVE NEGATIVE Final    Comment:        The GeneXpert MRSA Assay (FDA approved for NASAL specimens only), is one component of a comprehensive MRSA colonization surveillance program. It is not intended to diagnose MRSA infection nor to guide or monitor treatment for MRSA infections. Performed at [facility], [place] [address], [place],  [address]          Radiology Studies: Ct Abdomen Pelvis Wo Contrast  Result Date: 06/14/2017 CLINICAL DATA:  Abdominal pain, sacral wound EXAM: CT ABDOMEN AND PELVIS WITHOUT CONTRAST TECHNIQUE: Multidetector CT imaging of the abdomen and pelvis was performed following the standard protocol without IV contrast. COMPARISON:  None. FINDINGS: Lower chest: Lung bases are clear. Hepatobiliary: No focal hepatic lesion. Postcholecystectomy. No biliary dilatation. Pancreas: Low-attenuation ovoid tissue towards the pancreatic head measuring 3.1 x 2.2 cm (image 32/3) is not fully characterize. No pancreatic duct dilatation upstream of this lesion. Spleen: Normal spleen Adrenals/Urinary Tract: Adrenal glands are normal. There is a lobular contour to the anterior margin of the LEFT kidney measuring approximately 3.2 by 2.8 cm. This is not characterized on this noncontrast CT but has tissue density similar to the renal parenchyma. Ureters and bladder normal Stomach/Bowel: Stomach, small-bowel appendix are normal. The colon and rectosigmoid colon are normal. Vascular/Lymphatic: Abdominal aorta is normal caliber. There is no retroperitoneal or periportal lymphadenopathy. No pelvic lymphadenopathy. Reproductive: Post hysterectomy anatomy Other: No free fluid the abdomen pelvis. Musculoskeletal: Subcutaneous gas collection inferior and posterior to the last sacral element measuring 4.1 x  4.0 x 5.7 cm. This collection extends from the skin surface to the bone (coccyx). No CT evidence of osseous erosion. There is gas within the RIGHT gluteus muscle which extends medially and laterally. There is a expanded gas collection within the midportion of the muscle measuring 2.2 by 2.7 cm (image 93/3). There are scattered foci of gas within the subcutaneous fat along the RIGHT gluteus muscle. The entirety of the muscle is not imaged. IMPRESSION: 1. Sacral ulcer extends from the skin  surface to the most inferior sacral element posterior RIGHT of the coccyx. 2. Gas within the RIGHT gluteus muscle consistent with abscess. The entire gluteus muscle is not imaged. 3. 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 Recommend contrast enhanced CT or MRI when patient's condition warrants to exclude pancreatic neoplasm and renal neoplasm. These results will be called to the ordering clinician or representative by the Radiologist Assistant, and communication documented in the PACS or zVision Dashboard. Electronically Signed   By: Suzy Bouchard M.D.   On: 06/14/2017 16:18   Ct Head Wo Contrast  Result Date: 06/14/2017 CLINICAL DATA:  47 y/o  M; altered mental status. EXAM: CT HEAD WITHOUT CONTRAST TECHNIQUE: Contiguous axial images were obtained from the base of the skull through the vertex without intravenous contrast. COMPARISON:  None. FINDINGS: Brain: No evidence of acute infarction, hemorrhage, hydrocephalus, extra-axial collection or mass lesion/mass effect. Small hypodense foci in bifrontal subcortical white matter. Vascular: Calcific atherosclerosis of carotid siphons. No hyperdense vessel identified. Skull: Normal. Negative for fracture or focal lesion. Sinuses/Orbits: Small mucous retention cysts within the bilateral maxillary sinuses. Otherwise negative. Other: None. IMPRESSION: 1. No acute intracranial abnormality identified. 2. Small foci of hypoattenuation in bifrontal subcortical white  matter, probably mild chronic microvascular ischemic changes. Electronically Signed   By: Kristine Garbe M.D.   On: 06/14/2017 18:38   Dg Chest Port 1 View  Result Date: 06/14/2017 CLINICAL DATA:  Sleep apnea EXAM: PORTABLE CHEST 1 VIEW COMPARISON:  05/04/2017 FINDINGS: The heart is severely enlarged. Vascular congestion is worse. No Kerley B lines. Right jugular dialysis catheter is stable. No pneumothorax. No pleural effusion. IMPRESSION: Cardiomegaly and vascular congestion. Vascular congestion has worsened. No convincing evidence of pulmonary edema. Electronically Signed   By: Marybelle Killings M.D.   On: 06/14/2017 17:05        Scheduled Meds: . amiodarone  200 mg Oral Daily  . calcitRIOL  0.5 mcg Oral Q T,Th,Sat-1800  . darbepoetin (ARANESP) injection - DIALYSIS  150 mcg Intravenous Q Thu-HD  . insulin aspart  0-15 Units Subcutaneous TID WC  . insulin aspart  0-5 Units Subcutaneous QHS  . insulin aspart  5 Units Subcutaneous TID WC  . insulin glargine  25 Units Subcutaneous QHS   Continuous Infusions: . sodium chloride 50 mL/hr at 06/14/17 1830  . sodium chloride    . norepinephrine (LEVOPHED) Adult infusion    . piperacillin-tazobactam (ZOSYN)  IV Stopped (06/15/17 0500)  . vancomycin       LOS: 7 days        Aline August, MD [facility] Pager [phone]  If 7PM-7AM, please contact night-coverage [email or web address] Password [facility] 06/15/2017, 11:36 AM

## 2017-06-16 DIAGNOSIS — E11622 Type 2 diabetes mellitus with other skin ulcer: Secondary | ICD-10-CM

## 2017-06-16 DIAGNOSIS — B957 Other staphylococcus as the cause of diseases classified elsewhere: Secondary | ICD-10-CM

## 2017-06-16 DIAGNOSIS — M069 Rheumatoid arthritis, unspecified: Secondary | ICD-10-CM

## 2017-06-16 DIAGNOSIS — L089 Local infection of the skin and subcutaneous tissue, unspecified: Secondary | ICD-10-CM

## 2017-06-16 DIAGNOSIS — I5032 Chronic diastolic (congestive) heart failure: Secondary | ICD-10-CM

## 2017-06-16 DIAGNOSIS — E1151 Type 2 diabetes mellitus with diabetic peripheral angiopathy without gangrene: Secondary | ICD-10-CM

## 2017-06-16 DIAGNOSIS — Z7901 Long term (current) use of anticoagulants: Secondary | ICD-10-CM

## 2017-06-16 DIAGNOSIS — E1122 Type 2 diabetes mellitus with diabetic chronic kidney disease: Secondary | ICD-10-CM

## 2017-06-16 DIAGNOSIS — Z794 Long term (current) use of insulin: Secondary | ICD-10-CM

## 2017-06-16 DIAGNOSIS — Z8249 Family history of ischemic heart disease and other diseases of the circulatory system: Secondary | ICD-10-CM

## 2017-06-16 DIAGNOSIS — I132 Hypertensive heart and chronic kidney disease with heart failure and with stage 5 chronic kidney disease, or end stage renal disease: Secondary | ICD-10-CM

## 2017-06-16 LAB — BASIC METABOLIC PANEL
ANION GAP: 16 — AB (ref 5–15)
BUN: 28 mg/dL — ABNORMAL HIGH (ref 6–20)
CO2: 22 mmol/L (ref 22–32)
Calcium: 7.9 mg/dL — ABNORMAL LOW (ref 8.9–10.3)
Chloride: 96 mmol/L — ABNORMAL LOW (ref 101–111)
Creatinine, Ser: 4.85 mg/dL — ABNORMAL HIGH (ref 0.61–1.24)
GFR calc Af Amer: 15 mL/min — ABNORMAL LOW (ref >60)
GFR, EST NON AFRICAN AMERICAN: 13 mL/min — AB (ref >60)
Glucose, Bld: 152 mg/dL — ABNORMAL HIGH (ref 65–99)
POTASSIUM: 5.1 mmol/L (ref 3.5–5.1)
Sodium: 134 mmol/L — ABNORMAL LOW (ref 135–145)

## 2017-06-16 LAB — CULTURE, BLOOD (ROUTINE X 2)

## 2017-06-16 LAB — GLUCOSE, CAPILLARY
GLUCOSE-CAPILLARY: 148 mg/dL — AB (ref 65–99)
Glucose-Capillary: 128 mg/dL — ABNORMAL HIGH (ref 65–99)
Glucose-Capillary: 148 mg/dL — ABNORMAL HIGH (ref 65–99)
Glucose-Capillary: 133 mg/dL — ABNORMAL HIGH (ref 65–99)

## 2017-06-16 LAB — CBC WITH DIFFERENTIAL/PLATELET
BASOS ABS: 0 10*3/uL (ref 0.0–0.1)
Basophils Relative: 0 %
EOS PCT: 0 %
Eosinophils Absolute: 0 10*3/uL (ref 0.0–0.7)
HEMATOCRIT: 24 % — AB (ref 39.0–52.0)
Hemoglobin: 7.4 g/dL — ABNORMAL LOW (ref 13.0–17.0)
LYMPHS PCT: 7 %
Lymphs Abs: 1.4 10*3/uL (ref 0.7–4.0)
MCH: 26.6 pg (ref 26.0–34.0)
MCHC: 30.8 g/dL (ref 30.0–36.0)
MCV: 86.3 fL (ref 78.0–100.0)
Monocytes Absolute: 1.1 10*3/uL — ABNORMAL HIGH (ref 0.1–1.0)
Monocytes Relative: 5 %
NEUTROS ABS: 18.9 10*3/uL — AB (ref 1.7–7.7)
Neutrophils Relative %: 88 %
PLATELETS: 409 10*3/uL — AB (ref 150–400)
RBC: 2.78 MIL/uL — AB (ref 4.22–5.81)
RDW: 20.7 % — ABNORMAL HIGH (ref 11.5–15.5)
WBC: 21.4 10*3/uL — AB (ref 4.0–10.5)

## 2017-06-16 LAB — MAGNESIUM: Magnesium: 1.9 mg/dL (ref 1.7–2.4)

## 2017-06-16 MED ORDER — SODIUM CHLORIDE 0.9 % IV SOLN
2.0000 g | INTRAVENOUS | Status: DC
  Administered 2017-06-17 – 2017-07-03 (×8): 2 g via INTRAVENOUS
  Filled 2017-06-16 (×15): qty 2

## 2017-06-16 MED ORDER — APIXABAN 5 MG PO TABS
5.0000 mg | ORAL_TABLET | Freq: Two times a day (BID) | ORAL | Status: DC
  Administered 2017-06-16 – 2017-06-19 (×7): 5 mg via ORAL
  Filled 2017-06-16 (×7): qty 1

## 2017-06-16 MED ORDER — TRAMADOL HCL 50 MG PO TABS
50.0000 mg | ORAL_TABLET | Freq: Two times a day (BID) | ORAL | Status: DC | PRN
  Administered 2017-06-17 – 2017-06-19 (×2): 100 mg via ORAL
  Filled 2017-06-16 (×4): qty 2

## 2017-06-16 MED ORDER — SODIUM CHLORIDE 0.9 % IV SOLN
2.0000 g | Freq: Once | INTRAVENOUS | Status: AC
  Administered 2017-06-16: 2 g via INTRAVENOUS
  Filled 2017-06-16: qty 2

## 2017-06-16 NOTE — Progress Notes (Signed)
Occupational Therapy Treatment Patient Details Name: John Bailey MRN: [id number] DOB: 1970/08/16 Today's Date: 06/16/2017    History of present illness Pt is a 47 y.o. male with history of RA, PVD, HTN, ESRD on HD, A. fib, DM, CHF and sacral/coccyx pressure ulcer, who was admitted from 04/22/2017-05/13/2017 with anasarca from CHF and A. fib with RVR (cardioverted); was d/c to CIR for debility. He underwent debridement of sacral decubitus ulcer on 06/04/2017 by general surgery.  Pt was supposed to discharge from CIR on 06/09/2017 but general surgery recommended continued inpatient wound care, patient was readmitted on 06/09/2017.   OT comments  Pt with improved cognition/decreased lethargy this session excited to see therapy and motivated to get OOB to wc. Initially modA for bed mobility and able to sit EOB with min guard for balance. Required maxA+2 to stand with L-side platform RW; total A for LB dressing (shorts and tennis shoes) very easily fatigued after standing upright for dressing task ~15 sec, requiring totalA to return to supine. Since fatigued, max-totalA for repositioning to offload sacral wound. Recommend RN/NT staff use mechanical lift for current transfers. DC recommendation changed with continued severe change in status/ decreased activity tolerance and generalized weakness and increased need of assist for ADL. Next session to focus on sit to stand transfers with [facility] for safety and in prep/training for wc transfers.   Follow Up Recommendations  CIR;Supervision/Assistance - 24 hour    Equipment Recommendations  None recommended by OT(Pt has appropriate DME)    Recommendations for Other Services      Precautions / Restrictions Precautions Precautions: Fall Precaution Comments: Severe RA in shoulders, left hand Required Braces or Orthoses: Other Brace/Splint Other Brace/Splint: R ankle brace Restrictions Weight Bearing Restrictions: No       Mobility Bed Mobility Overal bed  mobility: Needs Assistance Bed Mobility: Rolling;Sidelying to Sit;Sit to Sidelying Rolling: Mod assist;Max assist;Total assist;+2 for physical assistance Sidelying to sit: Mod assist;+2 for physical assistance     Sit to sidelying: Total assist;+2 for physical assistance General bed mobility comments: Initial modA+1 to roll for bed mobility; requiring increasing assist for rolling upon return to supine with increased fatigue, eventually requiring totalA. Sat with modA+2 to assist BLEs and trunk elevation; pt able to scoot hips to EOB well. TotalA to return to supine due to fatigue  Transfers Overall transfer level: Needs assistance Equipment used: Left platform walker Transfers: Sit to/from Stand Sit to Stand: Max assist;+2 physical assistance         General transfer comment: Max+2 to assist trunk elevation standing with RW. Max cues with increased time and effort to achieve fully upright posture. Stood ~15 seconds requiring maxA+2 to don shorts while standing, with uncontrolled descent back to bed; pt fatigued requiring totalA to return to supine    Balance Overall balance assessment: Needs assistance Sitting-balance support: Feet supported;Bilateral upper extremity supported Sitting balance-Leahy Scale: Poor Sitting balance - Comments: Left lateral lean with posterior bias, requiring cues to correct; reliant on UE support and intermittent modA to maintain   Standing balance support: Bilateral upper extremity supported Standing balance-Leahy Scale: Poor Standing balance comment: reliant on UE support and external assist                           ADL either performed or assessed with clinical judgement   ADL Overall ADL's : Needs assistance/impaired     Grooming: Set up;Sitting;Wash/dry face;Wash/dry hands  Lower Body Dressing: Maximal assistance;+2 for physical assistance;+2 for safety/equipment;Sit to/from stand Lower Body Dressing Details  (indicate cue type and reason): unable to complete in standing due to leg buckling Toilet Transfer: Maximal assistance;+2 for physical assistance;+2 for safety/equipment             General ADL Comments: unable to complete SPT this session, legs gave way and max A +3 necessary to get back into bed.     Vision   Vision Assessment?: No apparent visual deficits   Perception     Praxis      Cognition Arousal/Alertness: Awake/alert Behavior During Therapy: WFL for tasks assessed/performed Overall Cognitive Status: Impaired/Different from baseline Area of Impairment: Attention;Following commands;Safety/judgement;Awareness;Problem solving                   Current Attention Level: Selective   Following Commands: Follows multi-step commands with increased time Safety/Judgement: Decreased awareness of safety;Decreased awareness of deficits Awareness: Emergent Problem Solving: Slow processing;Decreased initiation;Difficulty sequencing;Requires verbal cues;Requires tactile cues General Comments: Much improved lethargy this session. Pt with increased motivation for OOB mobility; decreased insight into deficits/strength. Slowed processing at times        Exercises     Shoulder Instructions       General Comments wife and brother-in-law present  throughout session    Pertinent Vitals/ Pain       Pain Assessment: Faces Faces Pain Scale: Hurts even more Pain Location: Sacrum with repositioning Pain Descriptors / Indicators: Discomfort;Grimacing Pain Intervention(s): Monitored during session;Repositioned  Home Living                                          Prior Functioning/Environment              Frequency  Min 2X/week        Progress Toward Goals  OT Goals(current goals can now be found in the care plan section)  Progress towards OT goals: Progressing toward goals  Acute Rehab OT Goals Patient Stated Goal: Get OOB OT Goal  Formulation: With patient Time For Goal Achievement: 06/23/17 Potential to Achieve Goals: Good  Plan Discharge plan needs to be updated;Frequency remains appropriate    Co-evaluation    PT/OT/SLP Co-Evaluation/Treatment: Yes Reason for Co-Treatment: Complexity of the patient's impairments (multi-system involvement);For patient/therapist safety;To address functional/ADL transfers PT goals addressed during session: Mobility/safety with mobility;Balance OT goals addressed during session: ADL's and self-care;Strengthening/ROM      AM-PAC PT "6 Clicks" Daily Activity     Outcome Measure   Help from another person eating meals?: A Lot Help from another person taking care of personal grooming?: A Little Help from another person toileting, which includes using toliet, bedpan, or urinal?: Total Help from another person bathing (including washing, rinsing, drying)?: A Lot Help from another person to put on and taking off regular upper body clothing?: A Lot Help from another person to put on and taking off regular lower body clothing?: Total 6 Click Score: 11    End of Session Equipment Utilized During Treatment: Other (comment)(L platform walker)  OT Visit Diagnosis: Muscle weakness (generalized) (M62.81);Other abnormalities of gait and mobility (R26.89);Other symptoms and signs involving cognitive function   Activity Tolerance Patient tolerated treatment well;Patient limited by fatigue   Patient Left in bed;with call bell/phone within reach;with family/visitor present   Nurse Communication Mobility status        Time:  6073-7106 OT Time Calculation (min): 39 min  Charges: OT General Charges $OT Visit: 1 Visit OT Treatments $Therapeutic Activity: 8-22 mins  Hulda Humphrey OTR/L [place] 06/16/2017, 4:08 PM

## 2017-06-16 NOTE — Consult Note (Signed)
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 He was also noted to have a MRSE bacteremia which may be a contaminant as it was found in only 1/4 bottles. Now POD 2 debridement of the sacral wound with no fevers but continued leukocytosis. Wound cultures remain pending with gram stain showing gram positive cocci, gram negative rods, and gram positive rods. General surgery indicates no further debridement necessary at this time. Concern for bone exposure and osteomyelitis. Will likely require prolonged course of antibiotics pending culture results.   1. Continue vancomycin. Change Zoysn to ceftazidime. Will narrow antibiotics further pending cultures. 2. Recheck blood cultures. 3. Continue wound care per general surgery and keeping pressure off area.  4. We will continue to follow.    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 amiodarone  200 mg Oral Daily  . apixaban  5 mg Oral BID  . calcitRIOL  0.5 mcg Oral Q T,Th,Sat-1800  . darbepoetin (ARANESP) injection - DIALYSIS  150 mcg Intravenous Q Thu-HD  . insulin  aspart  0-15 Units Subcutaneous TID WC  . insulin aspart  0-5 Units Subcutaneous QHS  . insulin aspart  5 Units Subcutaneous TID WC  . insulin glargine  25 Units Subcutaneous QHS     HPI: John Bailey is a 47 y.o. male with a previous medical history of RA, peripheral vascular disease, hypertension, atrial fibrillation on Eliquis, diabetes, heart failure, and end-stage renal disease on dialysis who was admitted to the hospital from inpatient rehabilitation with a sacral pressure ulcer continuing to require intensive inpatient wound care with hydrotherapy. The wound was initially noted during his hospitalization starting February 2017 where he was admitted for heart failure.    General surgery originally performed a debridement on 3/22 with necrotic tissue with a little purulent material at the base of the wound.  On 4/1 he was noted to have increased fevers of 103.1 and  general surgery performed a debridement of a right gluteal abscess the same day.  Upon positioning the patient in large amount of feculent material was expressed from the patient's rectum and continue to come out throughout the majority the procedure.  There was purulence noted to 5 cm below the skin.  Dead muscle/tissue was sent for pathology and the purulence was sent for culture.  A Penrose drain was inserted and the wound was irrigated. He has remained afebrile since debridement. Cultures from 3/31. Blood cultures on 3/31 were positive for MRSE. The surgical cultures have a gram stain positive for gram negative rods, gram positive cocci, and gram positive rods. He is currently receiving  vancomycin and piperacillin-tazobactam. Last WBC was 21.4 which was stable from previous. Surgery reviewed the wound this morning and noted that the wound is down to the bone and has concern for osteomyelitis.    Review of Systems: Review of Systems  Constitutional: Negative for chills, diaphoresis and fever.  Respiratory: Negative for cough,  shortness of breath and wheezing.   Cardiovascular: Negative for chest pain.  Gastrointestinal: Negative for abdominal pain, constipation, diarrhea, nausea and vomiting.  Skin: Negative for rash.     Past Medical History:  Diagnosis Date  . Atrial flutter with rapid ventricular response ([place]) 08/06/2015   a. s/p DCCV in 07/2015 with recurrent atrial fibrillation and DCCV in 12/2016. Initially successful but noted to be back in atrial fibrillation within 2 weeks of DCCV.   [name] Chronic diastolic (congestive) heart failure (HCC)    a. EF 50-55% by echo in 06/2016.  . Diabetes ([place])   . Dysrhythmia    Aflutter  . ESRD (end stage renal disease) on dialysis ([place])    "TTS; at [facility]" (06/08/2017)  . Hypertension   . Peripheral vascular disease ([place])   . Rheumatoid arthritis ([place])     Social History   Tobacco Use  . Smoking status: Never Smoker  . Smokeless tobacco: Never Used  Substance Use Topics  . Alcohol use: No  . Drug use: No    Family History  Problem Relation Age of Onset  . Heart disease Mother   . Hyperlipidemia Father   . Hypertension Father     No Known Allergies  OBJECTIVE: Blood pressure 106/62, pulse 81, temperature 98.1 F (36.7 C), temperature source Oral, resp. rate (!) 25, height 6' (1.829 m), weight 266 lb (120.7 kg), SpO2 97 %.  Physical Exam  Constitutional: He is well-developed, well-nourished, and in no distress. No distress.  Cardiovascular: Normal rate, normal heart sounds and intact distal pulses. An irregularly irregular rhythm present. Exam reveals no gallop and no friction rub.  No murmur heard. Pulmonary/Chest: Effort normal and breath sounds normal. No respiratory distress. He has no wheezes. He has no rales. He exhibits no tenderness.  Skin:  Sacral wound covered with dressing and clean dry and intact. Reviewed by surgery this morning.     Lab Results Lab Results  Component Value Date   WBC 21.4 (H) 06/16/2017   HGB 7.4 (L) 06/16/2017    HCT 24.0 (L) 06/16/2017   MCV 86.3 06/16/2017   PLT 409 (H) 06/16/2017    Lab Results  Component Value Date   CREATININE 4.85 (H) 06/16/2017   BUN 28 (H) 06/16/2017   NA 134 (L) 06/16/2017   K 5.1 06/16/2017   CL 96 (L) 06/16/2017   CO2 22 06/16/2017    Lab Results  Component Value Date   ALT 16 (L) 06/15/2017   AST 23 06/15/2017   ALKPHOS 96 06/15/2017   BILITOT 0.8 06/15/2017     Microbiology: Recent Results (from the past 240 hour(s))  MRSA PCR Screening     Status: None   Collection Time: 06/08/17  6:37 PM  Result Value Ref Range Status   MRSA by PCR NEGATIVE NEGATIVE Final    Comment:        The GeneXpert MRSA Assay (FDA approved for NASAL specimens only), is one component of a comprehensive MRSA colonization surveillance program. It is not intended to diagnose MRSA infection nor to guide or monitor treatment for MRSA infections. Performed at [facility], [place] [address], [place], [place]  [address]   Culture, blood (routine x 2)     Status: None (Preliminary result)   Collection Time: 06/13/17 11:26 AM  Result Value Ref Range Status   Specimen Description BLOOD RIGHT HAND  Final   Special Requests   Final    IN BOTH AEROBIC AND ANAEROBIC BOTTLES Blood Culture adequate volume   Culture   Final    NO GROWTH 2 DAYS Performed at [facility], [place] [address], [place], [place] [address]    Report Status PENDING  Incomplete  Culture, blood (routine x 2)     Status: Abnormal   Collection Time: 06/13/17 11:26 AM  Result Value Ref Range Status   Specimen Description BLOOD RIGHT HAND  Final   Special Requests   Final    IN BOTH AEROBIC AND ANAEROBIC BOTTLES Blood Culture adequate volume   Culture  Setup Time   Final    GRAM POSITIVE COCCI ANAEROBIC BOTTLE ONLY CRITICAL RESULT CALLED TO, READ BACK BY AND VERIFIED WITH: Karlene Einstein PharmD 14:40 06/14/17 ([email or web address])    Culture (A)  Final    STAPHYLOCOCCUS SPECIES (COAGULASE NEGATIVE) THE SIGNIFICANCE OF  ISOLATING THIS ORGANISM FROM A SINGLE SET OF BLOOD CULTURES WHEN MULTIPLE SETS ARE DRAWN IS UNCERTAIN. PLEASE NOTIFY THE MICROBIOLOGY DEPARTMENT WITHIN ONE WEEK IF SPECIATION AND SENSITIVITIES ARE REQUIRED. Performed at [facility], [place] [address], [place], [place] [address]    Report Status 06/16/2017 FINAL  Final  Blood Culture ID Panel (Reflexed)     Status: Abnormal   Collection Time: 06/13/17 11:26 AM  Result Value Ref Range Status   Enterococcus species NOT DETECTED NOT DETECTED Final   Listeria monocytogenes NOT DETECTED NOT DETECTED Final   Staphylococcus species DETECTED (A) NOT DETECTED Final    Comment: Methicillin (oxacillin) resistant coagulase negative staphylococcus. Possible blood culture contaminant (unless isolated from more than one blood culture draw or clinical case suggests pathogenicity). No antibiotic treatment is indicated for blood  culture contaminants. CRITICAL RESULT CALLED TO, READ BACK BY AND VERIFIED WITH: Karlene Einstein PharmD 14:40 06/14/17 ([email or web address])    Staphylococcus aureus NOT DETECTED NOT DETECTED Final   Methicillin resistance DETECTED (A) NOT DETECTED Final    Comment: CRITICAL RESULT CALLED TO, READ BACK BY AND VERIFIED WITH: Karlene Einstein PharmD 14:40 06/14/17 ([email or web address])    Streptococcus species NOT DETECTED NOT DETECTED Final   Streptococcus agalactiae NOT DETECTED NOT DETECTED Final   Streptococcus pneumoniae NOT DETECTED NOT DETECTED Final   Streptococcus pyogenes NOT DETECTED NOT DETECTED Final   Acinetobacter baumannii NOT DETECTED NOT DETECTED Final   Enterobacteriaceae species NOT DETECTED NOT DETECTED Final   Enterobacter cloacae complex NOT DETECTED NOT DETECTED Final   Escherichia coli NOT DETECTED NOT DETECTED Final   Klebsiella oxytoca NOT DETECTED NOT DETECTED Final   Klebsiella pneumoniae NOT DETECTED NOT DETECTED Final   Proteus species NOT DETECTED NOT DETECTED Final   Serratia marcescens NOT DETECTED NOT DETECTED Final    Haemophilus influenzae NOT DETECTED NOT DETECTED Final   Neisseria meningitidis NOT DETECTED NOT DETECTED Final   Pseudomonas aeruginosa NOT DETECTED NOT DETECTED Final   Candida albicans NOT DETECTED NOT DETECTED Final   Candida glabrata NOT DETECTED NOT DETECTED Final   Candida krusei NOT DETECTED NOT DETECTED Final   Candida parapsilosis NOT DETECTED NOT DETECTED Final   Candida tropicalis NOT DETECTED NOT DETECTED Final    Comment: Performed at [facility], [place]. [address], [place], [place] [address]  Aerobic/Anaerobic Culture (surgical/deep  wound)     Status: None (Preliminary result)   Collection Time: 06/14/17  7:52 PM  Result Value Ref Range Status   Specimen Description ABSCESS GLUTEAL  Final   Special Requests PATIENT ON FOLLOWING ZOSYN AND VANC  Final   Gram Stain   Final    FEW WBC PRESENT, PREDOMINANTLY PMN FEW GRAM POSITIVE COCCI FEW GRAM NEGATIVE RODS RARE GRAM POSITIVE RODS    Culture   Final    TOO YOUNG TO READ Performed at [facility], [place] [address] [place], [place] [address]    Report Status PENDING  Incomplete  MRSA PCR Screening     Status: None   Collection Time: 06/15/17 12:48 AM  Result Value Ref Range Status   MRSA by PCR NEGATIVE NEGATIVE Final    Comment:        The GeneXpert MRSA Assay (FDA approved for NASAL specimens only), is one component of a comprehensive MRSA colonization surveillance program. It is not intended to diagnose MRSA infection nor to guide or monitor treatment for MRSA infections. Performed at [facility], [place] [address] [place],  [address]      Terri Piedra, [facility] [phone] Pager  06/16/2017  9:01 AM

## 2017-06-16 NOTE — Progress Notes (Signed)
Physical Therapy Treatment Patient Details Name: John Bailey MRN: [id number] DOB: 07-16-1970 Today's Date: 06/16/2017    History of Present Illness Pt is a 47 y.o. male with history of RA, PVD, HTN, ESRD on HD, A. fib, DM, CHF and sacral/coccyx pressure ulcer, who was admitted from 04/22/2017-05/13/2017 with anasarca from CHF and A. fib with RVR (cardioverted); was d/c to CIR for debility. He underwent debridement of sacral decubitus ulcer on 06/04/2017 by general surgery.  Pt was supposed to discharge from CIR on 06/09/2017 but general surgery recommended continued inpatient wound care, patient was readmitted on 06/09/2017.   PT Comments    Pt with improved cognition/decreased lethargy this session; motivated to get OOB. Initially modA for bed mobility and able to sit EOB with min guard for balance. Required maxA+2 to stand with L-side platform RW; very easily fatigued after standing upright ~15 sec, requiring totalA to return to supine. Since fatigued, max-totalA for repositioning to offload sacral wound. Will likely plan for transfer to chair with Stedy next session. Recommend RN/NT staff use mechanical lift for current transfers.   Follow Up Recommendations  CIR;Supervision for mobility/OOB     Equipment Recommendations  (TBD next venue)    Recommendations for Other Services       Precautions / Restrictions Precautions Precautions: Fall Precaution Comments: Severe RA in shoulders, left hand Restrictions Weight Bearing Restrictions: No    Mobility  Bed Mobility Overal bed mobility: Needs Assistance Bed Mobility: Rolling;Sidelying to Sit;Sit to Sidelying Rolling: Mod assist;Max assist;Total assist;+2 for physical assistance Sidelying to sit: Mod assist;+2 for physical assistance     Sit to sidelying: Total assist;+2 for physical assistance General bed mobility comments: Initial modA+1 to roll for bed mobility; requiring increasing assist for rolling upon return to supine with  increased fatigue, eventually requiring totalA. Sat with modA+2 to assist BLEs and trunk elevation; pt able to scoot hips to EOB well. TotalA to return to supine due to fatigue  Transfers Overall transfer level: Needs assistance Equipment used: Left platform walker Transfers: Sit to/from Stand Sit to Stand: Max assist;+2 physical assistance         General transfer comment: Max+2 to assist trunk elevation standing with RW. Max cues with increased time and effort to achieve fully upright posture. Stood ~15 seconds requiring maxA+2 to don shorts while standing, with uncontrolled descent back to bed; pt fatigued requiring totalA to return to supine  Ambulation/Gait             General Gait Details: Unable to this session   Stairs            Wheelchair Mobility    Modified Rankin (Stroke Patients Only)       Balance Overall balance assessment: Needs assistance Sitting-balance support: Feet supported;Bilateral upper extremity supported Sitting balance-Leahy Scale: Poor Sitting balance - Comments: Left lateral lean with posterior bias, requiring cues to correct; reliant on UE support and intermittent modA to maintain   Standing balance support: Bilateral upper extremity supported Standing balance-Leahy Scale: Poor Standing balance comment: reliant on UE support and external assist                            Cognition Arousal/Alertness: Awake/alert Behavior During Therapy: WFL for tasks assessed/performed Overall Cognitive Status: Impaired/Different from baseline Area of Impairment: Attention;Following commands;Safety/judgement;Awareness;Problem solving                   Current Attention Level: Selective  Following Commands: Follows multi-step commands with increased time Safety/Judgement: Decreased awareness of safety;Decreased awareness of deficits Awareness: Emergent Problem Solving: Slow processing;Decreased initiation;Difficulty  sequencing;Requires verbal cues;Requires tactile cues General Comments: Much improved lethargy this session. Pt with increased motivation for OOB mobility; decreased insight into deficits/strength. Slowed processing at times      Exercises      General Comments General comments (skin integrity, edema, etc.): Wife and brother-in-law present throughout session      Pertinent Vitals/Pain Pain Assessment: Faces Faces Pain Scale: Hurts even more Pain Location: Sacrum with repositioning Pain Descriptors / Indicators: Discomfort;Grimacing Pain Intervention(s): Monitored during session;Repositioned;Limited activity within patient's tolerance    Home Living                      Prior Function            PT Goals (current goals can now be found in the care plan section) Acute Rehab PT Goals Patient Stated Goal: Get OOB PT Goal Formulation: With patient Time For Goal Achievement: 06/28/17 Potential to Achieve Goals: Good Progress towards PT goals: Progressing toward goals    Frequency    Min 3X/week      PT Plan Current plan remains appropriate    Co-evaluation PT/OT/SLP Co-Evaluation/Treatment: Yes Reason for Co-Treatment: Complexity of the patient's impairments (multi-system involvement);For patient/therapist safety;To address functional/ADL transfers PT goals addressed during session: Mobility/safety with mobility;Balance        AM-PAC PT "6 Clicks" Daily Activity  Outcome Measure  Difficulty turning over in bed (including adjusting bedclothes, sheets and blankets)?: Unable Difficulty moving from lying on back to sitting on the side of the bed? : Unable Difficulty sitting down on and standing up from a chair with arms (e.g., wheelchair, bedside commode, etc,.)?: Unable Help needed moving to and from a bed to chair (including a wheelchair)?: A Lot Help needed walking in hospital room?: Total Help needed climbing 3-5 steps with a railing? : Total 6 Click  Score: 7    End of Session   Activity Tolerance: Patient limited by lethargy;Patient limited by fatigue;Patient tolerated treatment well Patient left: in bed;with call bell/phone within reach;with family/visitor present Nurse Communication: Mobility status PT Visit Diagnosis: Other abnormalities of gait and mobility (R26.89);Muscle weakness (generalized) (M62.81)     Time: 4656-8127 PT Time Calculation (min) (ACUTE ONLY): 39 min  Charges:  $Therapeutic Activity: 23-37 mins                    G Codes:      Mabeline Caras, PT, DPT Acute Rehab Services  Pager: [place] 06/16/2017, 12:41 PM

## 2017-06-16 NOTE — Progress Notes (Signed)
Patient ID: John Bailey, male   DOB: 10-15-1970, 47 y.o.   MRN: [id number]  PROGRESS NOTE    John Bailey  VQM:[id number] DOB: 10/16/1970 DOA: 06/08/2017 PCP: [name], MD   Brief Narrative:  47 year old male with history of RA, PVD, hypertension, end-stage renal disease on hemodialysis, A. fib on Eliquis, diabetes and chronic diastolic CHF and sacral and coccyx pressure ulcer who was admitted from 04/22/2017-05/13/2017 with anasarca from acute on chronic diastolic heart failure and A. fib with RVR (cardioverted) and was discharged to inpatient rehab for debility.  He underwent debridement of sacral decubitus ulcer on 06/04/2017 by general surgery.  He was supposed to be discharged from [place]  on 06/09/2017 but general surgery recommended continued inpatient wound care so patient was readmitted on 06/09/2017.  He required debridement of the right gluteal abscess on 06/14/2017 by general surgery.   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  One set of blood culture is growing coagulase-negative staph, probably a contaminant.  -I have consulted ID as per general surgery recommendations: Continue vancomycin.  Zosyn has  been changed to ceftazidime as per ID.   -Follow further general surgery recommendations -Currently afebrile  Acute metabolic encephalopathy -Probably secondary to above.  Mental status much improved.  CT of the head was negative for acute abnormality  Unstageable Sacral ulcer status post debridement -Debridement was done by Dr. Grandville Silos on 06/04/2017.   -General surgery following.  -Hydrotherapy will be resumed from tomorrow as per surgery recommendations.  Dressing changes as per general surgery.  ESRD on HD  -Nephrology following.  Dialysis as per nephrology schedule.  Hypertension -Blood pressure on the lower side.  Coreg on hold.  Monitor  Type II Diabetes  -Continue current dose of Levemir along with NovoLog with meals.  Continue Accu-Cheks with coverage.  Leukocytosis, likely reactive, related to underlying sacral wound and steroids in the setting of RA   - Repeat a.m. labs.  Antibiotic plan as above  Anemia of chronic disease  -Hemoglobin stable.  Monitor.  No signs of bleeding.  Atrial Fibrillation CHA2DS2-VASc score 2-3 , on anticoagulation with Eliquis. Echo 04/25/17  EF 93-26, normal systolic  -Status post recent cardioversion during last hospitalization.  Currently rate controlled Continue amiodarone.  Hold Coreg.  Resume Eliquis since general surgery has cleared for resumption.  Chronic diastolic heart failure.   -Recent hospitalization for acute decompensated heart failure. -Currently compensated and volume is managed by dialysis.  Hold Coreg.  Outpatient follow-up with cardiology  Rheumatoid arthritis with flare Continue 6-week prednisone taper which was started on 05/19/2017. outpatient follow-up with rheumatology.  Patient had been receiving abatacept every Tuesday prior to admission; will hold it for now.  Deconditioning  -Continue PT/OT  DVT prophylaxis:  Resume Eliquis Code Status:  Full  Family Communication:   Spoke to wife at bedside Disposition  Plan: Probable home once clinically improved and cleared by general surgery  Consultants: General surgery/nephrology/ID  Procedures: None  Antimicrobials: None   Subjective: Patient seen and examined at bedside.  He is awake and answering questions.  No overnight fever, nausea or vomiting or worsening pain. objective: Vitals:   06/15/17 2321 06/16/17 0421 06/16/17 0620 06/16/17 0822  BP: 114/67 (!) 95/55  106/62  Pulse: 76 81  81  Resp: 14 14  (!) 25  Temp: 97.8 F (36.6 C) 98 F (36.7 C)  98.1 F (36.7 C)  TempSrc: Oral Oral  Oral  SpO2: 99% 97%  97%  Weight:   120.7 kg (266 lb)   Height:        Intake/Output Summary (Last 24 hours) at 06/16/2017 1127 Last data filed at 06/16/2017 0538 Gross per 24 hour  Intake 382 ml  Output 0 ml  Net 382 ml   Filed Weights   06/15/17 1316 06/15/17 1616 06/16/17 0620  Weight: 112.9 kg (249 lb) 112.9 kg (248 lb 14.4 oz) 120.7 kg (266 lb)    Examination:  General exam: No acute distress  respiratory system: Bilateral decreased breath sounds at bases  cardiovascular system: S1-S2 positive, rate controlled gastrointestinal system: Abdomen is obese, nondistended, soft and nontender. Normal bowel sounds heard. Extremities: No cyanosis; trace edema  Data Reviewed: I have personally reviewed following labs and imaging studies  CBC: Recent Labs  Lab 06/12/17 0358 06/14/17 0752 06/14/17 1644 06/15/17 0240 06/16/17 0259  WBC 24.5* 22.6* 19.7* 21.7* 21.4*  NEUTROABS 21.8* 18.3* 16.7* 20.4* 18.9*  HGB 8.9* 7.5* 8.1* 7.1* 7.4*  HCT 29.2* 24.7* 25.6* 23.3* 24.0*  MCV 88.8 89.5 89.2 88.9 86.3  PLT 343 388 330 357 956*   Basic Metabolic Panel: Recent Labs  Lab 06/11/17 0437 06/12/17 0358 06/14/17 0752 06/14/17 1644 06/15/17 0240 06/16/17 0259  NA 132* 135 134* 130* 131* 134*  K 3.9 4.4 4.2 4.2 5.1 5.1  CL 96* 97* 97* 95* 96* 96*  CO2 25 24 24 22  21* 22  GLUCOSE 323* 142* 94 127* 172* 152*  BUN 38* 54* 39* 43* 48* 28*    CREATININE 4.76* 6.19* 6.90* 7.00* 7.53* 4.85*  CALCIUM 8.3* 8.7* 8.2* 8.0* 8.4* 7.9*  MG 1.7 1.7 1.6*  --  1.6* 1.9   GFR: Estimated Creatinine Clearance: 26.1 mL/min (A) (by C-G formula based on SCr of 4.85 mg/dL (H)). Liver Function Tests: Recent Labs  Lab 06/14/17 0752 06/14/17 1644 06/15/17 0240  AST 17 33 23  ALT 10* 14* 16*  ALKPHOS 83 85 96  BILITOT 0.7 1.1 0.8  PROT 5.9* 5.4* 5.7*  ALBUMIN 1.8* 1.7* 1.7*   No results for input(s): LIPASE, AMYLASE in the last 168 hours. Recent Labs  Lab 06/14/17 1644  AMMONIA 23   Coagulation Profile: No results for input(s): INR, PROTIME in the last 168 hours. Cardiac Enzymes: No results for input(s): CKTOTAL, CKMB, CKMBINDEX, TROPONINI in the last 168 hours. BNP (last 3 results) No results for input(s): PROBNP in the last 8760 hours. HbA1C: No results for input(s): HGBA1C in the last 72 hours. CBG: Recent Labs  Lab 06/15/17 0817 06/15/17 1221 06/15/17 1743 06/15/17 2225 06/16/17 0825  GLUCAP 171* 158* 124* 122* 128*   Lipid Profile: No results for input(s): CHOL, HDL, LDLCALC, TRIG, CHOLHDL, LDLDIRECT in the last 72 hours. Thyroid Function Tests: No results for input(s): TSH, T4TOTAL, FREET4, T3FREE, THYROIDAB in the  last 72 hours. Anemia Panel: No results for input(s): VITAMINB12, FOLATE, FERRITIN, TIBC, IRON, RETICCTPCT in the last 72 hours. Sepsis Labs: Recent Labs  Lab 06/14/17 1644  LATICACIDVEN 1.8    Recent Results (from the past 240 hour(s))  MRSA PCR Screening     Status: None   Collection Time: 06/08/17  6:37 PM  Result Value Ref Range Status   MRSA by PCR NEGATIVE NEGATIVE Final    Comment:        The GeneXpert MRSA Assay (FDA approved for NASAL specimens only), is one component of a comprehensive MRSA colonization surveillance program. It is not intended to diagnose MRSA infection nor to guide or monitor treatment for MRSA infections. Performed at [facility], [place] [address]  [place], [place] [address]   Culture, blood (routine x 2)     Status: None (Preliminary result)   Collection Time: 06/13/17 11:26 AM  Result Value Ref Range Status   Specimen Description BLOOD RIGHT HAND  Final   Special Requests   Final    IN BOTH AEROBIC AND ANAEROBIC BOTTLES Blood Culture adequate volume   Culture   Final    NO GROWTH 2 DAYS Performed at [facility], [place] [address] [place], [place] [address]    Report Status PENDING  Incomplete  Culture, blood (routine x 2)     Status: Abnormal   Collection Time: 06/13/17 11:26 AM  Result Value Ref Range Status   Specimen Description BLOOD RIGHT HAND  Final   Special Requests   Final    IN BOTH AEROBIC AND ANAEROBIC BOTTLES Blood Culture adequate volume   Culture  Setup Time   Final    GRAM POSITIVE COCCI ANAEROBIC BOTTLE ONLY CRITICAL RESULT CALLED TO, READ BACK BY AND VERIFIED WITH: Karlene Einstein PharmD 14:40 06/14/17 ([email or web address])    Culture (A)  Final    STAPHYLOCOCCUS SPECIES (COAGULASE NEGATIVE) THE SIGNIFICANCE OF ISOLATING THIS ORGANISM FROM A SINGLE SET OF BLOOD CULTURES WHEN MULTIPLE SETS ARE DRAWN IS UNCERTAIN. PLEASE NOTIFY THE MICROBIOLOGY DEPARTMENT WITHIN ONE WEEK IF SPECIATION AND SENSITIVITIES ARE REQUIRED. Performed at [facility], [place] [address], [place], [place] [address]    Report Status 06/16/2017 FINAL  Final  Blood Culture ID Panel (Reflexed)     Status: Abnormal   Collection Time: 06/13/17 11:26 AM  Result Value Ref Range Status   Enterococcus species NOT DETECTED NOT DETECTED Final   Listeria monocytogenes NOT DETECTED NOT DETECTED Final   Staphylococcus species DETECTED (A) NOT DETECTED Final    Comment: Methicillin (oxacillin) resistant coagulase negative staphylococcus. Possible blood culture contaminant (unless isolated from more than one blood culture draw or clinical case suggests pathogenicity). No antibiotic treatment is indicated for blood  culture contaminants. CRITICAL RESULT CALLED TO,  READ BACK BY AND VERIFIED WITH: Karlene Einstein PharmD 14:40 06/14/17 ([email or web address])    Staphylococcus aureus NOT DETECTED NOT DETECTED Final   Methicillin resistance DETECTED (A) NOT DETECTED Final    Comment: CRITICAL RESULT CALLED TO, READ BACK BY AND VERIFIED WITH: Karlene Einstein PharmD 14:40 06/14/17 ([email or web address])    Streptococcus species NOT DETECTED NOT DETECTED Final   Streptococcus agalactiae NOT DETECTED NOT DETECTED Final   Streptococcus pneumoniae NOT DETECTED NOT DETECTED Final   Streptococcus pyogenes NOT DETECTED NOT DETECTED Final   Acinetobacter baumannii NOT DETECTED NOT DETECTED Final   Enterobacteriaceae species NOT DETECTED NOT DETECTED Final   Enterobacter cloacae complex NOT DETECTED NOT DETECTED Final   Escherichia coli NOT  DETECTED NOT DETECTED Final   Klebsiella oxytoca NOT DETECTED NOT DETECTED Final   Klebsiella pneumoniae NOT DETECTED NOT DETECTED Final   Proteus species NOT DETECTED NOT DETECTED Final   Serratia marcescens NOT DETECTED NOT DETECTED Final   Haemophilus influenzae NOT DETECTED NOT DETECTED Final   Neisseria meningitidis NOT DETECTED NOT DETECTED Final   Pseudomonas aeruginosa NOT DETECTED NOT DETECTED Final   Candida albicans NOT DETECTED NOT DETECTED Final   Candida glabrata NOT DETECTED NOT DETECTED Final   Candida krusei NOT DETECTED NOT DETECTED Final   Candida parapsilosis NOT DETECTED NOT DETECTED Final   Candida tropicalis NOT DETECTED NOT DETECTED Final    Comment: Performed at [facility], [place] [address], [place], [place] [address]  Aerobic/Anaerobic Culture (surgical/deep wound)     Status: None (Preliminary result)   Collection Time: 06/14/17  7:52 PM  Result Value Ref Range Status   Specimen Description ABSCESS GLUTEAL  Final   Special Requests PATIENT ON FOLLOWING ZOSYN AND VANC  Final   Gram Stain   Final    FEW WBC PRESENT, PREDOMINANTLY PMN FEW GRAM POSITIVE COCCI FEW GRAM NEGATIVE RODS RARE GRAM POSITIVE RODS    Culture    Final    TOO YOUNG TO READ Performed at [facility], [place] [address], [place], [place] [address]    Report Status PENDING  Incomplete  MRSA PCR Screening     Status: None   Collection Time: 06/15/17 12:48 AM  Result Value Ref Range Status   MRSA by PCR NEGATIVE NEGATIVE Final    Comment:        The GeneXpert MRSA Assay (FDA approved for NASAL specimens only), is one component of a comprehensive MRSA colonization surveillance program. It is not intended to diagnose MRSA infection nor to guide or monitor treatment for MRSA infections. Performed at [facility], [place] [address], [place], [place] [address]          Radiology Studies: Ct Abdomen Pelvis Wo Contrast  Result Date: 06/14/2017 CLINICAL DATA:  Abdominal pain, sacral wound EXAM: CT ABDOMEN AND PELVIS WITHOUT CONTRAST TECHNIQUE: Multidetector CT imaging of the abdomen and pelvis was performed following the standard protocol without IV contrast. COMPARISON:  None. FINDINGS: Lower chest: Lung bases are clear. Hepatobiliary: No focal hepatic lesion. Postcholecystectomy. No biliary dilatation. Pancreas: Low-attenuation ovoid tissue towards the pancreatic head measuring 3.1 x 2.2 cm (image 32/3) is not fully characterize. No pancreatic duct dilatation upstream of this lesion. Spleen: Normal spleen Adrenals/Urinary Tract: Adrenal glands are normal. There is a lobular contour to the anterior margin of the LEFT kidney measuring approximately 3.2 by 2.8 cm. This is not characterized on this noncontrast CT but has tissue density similar to the renal parenchyma. Ureters and bladder normal Stomach/Bowel: Stomach, small-bowel appendix are normal. The colon and rectosigmoid colon are normal. Vascular/Lymphatic: Abdominal aorta is normal caliber. There is no retroperitoneal or periportal lymphadenopathy. No pelvic lymphadenopathy. Reproductive: Post hysterectomy anatomy Other: No free fluid the abdomen pelvis. Musculoskeletal:  Subcutaneous gas collection inferior and posterior to the last sacral element measuring 4.1 x 4.0 x 5.7 cm. This collection extends from the skin surface to the bone (coccyx). No CT evidence of osseous erosion. There is gas within the RIGHT gluteus muscle which extends medially and laterally. There is a expanded gas collection within the midportion of the muscle measuring 2.2 by 2.7 cm (image 93/3). There are scattered foci of gas within the subcutaneous fat along the RIGHT  gluteus muscle. The entirety of the muscle is not imaged. IMPRESSION: 1. Sacral ulcer extends from the skin surface to the most inferior sacral element posterior RIGHT of the coccyx. 2. Gas within the RIGHT gluteus muscle consistent with abscess. The entire gluteus muscle is not imaged. 3. 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 Recommend contrast enhanced CT or MRI when patient's condition warrants to exclude pancreatic neoplasm and renal neoplasm. These results will be called to the ordering clinician or representative by the Radiologist Assistant, and communication documented in the PACS or zVision Dashboard. Electronically Signed   By: Suzy Bouchard M.D.   On: 06/14/2017 16:18   Ct Head Wo Contrast  Result Date: 06/14/2017 CLINICAL DATA:  47 y/o  M; altered mental status. EXAM: CT HEAD WITHOUT CONTRAST TECHNIQUE: Contiguous axial images were obtained from the base of the skull through the vertex without intravenous contrast. COMPARISON:  None. FINDINGS: Brain: No evidence of acute infarction, hemorrhage, hydrocephalus, extra-axial collection or mass lesion/mass effect. Small hypodense foci in bifrontal subcortical white matter. Vascular: Calcific atherosclerosis of carotid siphons. No hyperdense vessel identified. Skull: Normal. Negative for fracture or focal lesion. Sinuses/Orbits: Small mucous retention cysts within the bilateral maxillary sinuses. Otherwise negative. Other: None. IMPRESSION: 1. No acute intracranial  abnormality identified. 2. Small foci of hypoattenuation in bifrontal subcortical white matter, probably mild chronic microvascular ischemic changes. Electronically Signed   By: Kristine Garbe M.D.   On: 06/14/2017 18:38   Dg Chest Port 1 View  Result Date: 06/14/2017 CLINICAL DATA:  Sleep apnea EXAM: PORTABLE CHEST 1 VIEW COMPARISON:  05/04/2017 FINDINGS: The heart is severely enlarged. Vascular congestion is worse. No Kerley B lines. Right jugular dialysis catheter is stable. No pneumothorax. No pleural effusion. IMPRESSION: Cardiomegaly and vascular congestion. Vascular congestion has worsened. No convincing evidence of pulmonary edema. Electronically Signed   By: Marybelle Killings M.D.   On: 06/14/2017 17:05        Scheduled Meds: . amiodarone  200 mg Oral Daily  . apixaban  5 mg Oral BID  . calcitRIOL  0.5 mcg Oral Q T,Th,Sat-1800  . darbepoetin (ARANESP) injection - DIALYSIS  150 mcg Intravenous Q Thu-HD  . insulin aspart  0-15 Units Subcutaneous TID WC  . insulin aspart  0-5 Units Subcutaneous QHS  . insulin aspart  5 Units Subcutaneous TID WC  . insulin glargine  25 Units Subcutaneous QHS   Continuous Infusions: . sodium chloride    . cefTAZidime (FORTAZ)  IV 2 g (06/16/17 1102)  . [START ON 06/17/2017] cefTAZidime (FORTAZ)  IV    . norepinephrine (LEVOPHED) Adult infusion    . vancomycin Stopped (06/15/17 1756)     LOS: 8 days        Aline August, MD [facility] Pager [phone]  If 7PM-7AM, please contact night-coverage [email or web address] Password [facility] 06/16/2017, 11:27 AM

## 2017-06-16 NOTE — Progress Notes (Addendum)
2 Days Post-Op    BD:ZHGDJM decubitus/right gluteal abscess    Subjective: Dressing taken down and evaluated by myself and Dr. Donne Hazel.  He is down to the bone over the sacrum.  Nothing else needs debridement in either wound.  Some good bleeding with removal of packing from the gluteal abscess site.    Objective: Vital signs in last 24 hours: Temp:  [97.4 F (36.3 C)-98.2 F (36.8 C)] 98 F (36.7 C) (04/03 0421) Pulse Rate:  [70-82] 81 (04/03 0421) Resp:  [14-25] 14 (04/03 0421) BP: (95-123)/(55-80) 95/55 (04/03 0421) SpO2:  [96 %-99 %] 97 % (04/03 0421) Weight:  [112.9 kg (248 lb 14.4 oz)-120.7 kg (266 lb)] 120.7 kg (266 lb) (04/03 0620) Last BM Date: 06/14/17 552 PO 625 IV No urine  No BM Afebrile, VSS WBC still 21.4  Intake/Output from previous day: 04/02 0701 - 04/03 0700 In: 1247.8 [P.O.:522; I.V.:625.8; IV Piggyback:100] Out: 0  Intake/Output this shift: No intake/output data recorded.  General appearance: alert, cooperative, no distress and he is doing dressing changes with just Tylenol.  Skin: Both sites clean.  we are down to bone on the sacral site.  Bleeding from abscess site with removal of packing.     Lab Results:  Recent Labs    06/15/17 0240 06/16/17 0259  WBC 21.7* 21.4*  HGB 7.1* 7.4*  HCT 23.3* 24.0*  PLT 357 409*    BMET Recent Labs    06/15/17 0240 06/16/17 0259  NA 131* 134*  K 5.1 5.1  CL 96* 96*  CO2 21* 22  GLUCOSE 172* 152*  BUN 48* 28*  CREATININE 7.53* 4.85*  CALCIUM 8.4* 7.9*   PT/INR No results for input(s): LABPROT, INR in the last 72 hours.  Recent Labs  Lab 06/14/17 0752 06/14/17 1644 06/15/17 0240  AST 17 33 23  ALT 10* 14* 16*  ALKPHOS 83 85 96  BILITOT 0.7 1.1 0.8  PROT 5.9* 5.4* 5.7*  ALBUMIN 1.8* 1.7* 1.7*     Lipase  No results found for: LIPASE   Medications: . amiodarone  200 mg Oral Daily  . calcitRIOL  0.5 mcg Oral Q T,Th,Sat-1800  . darbepoetin (ARANESP) injection - DIALYSIS  150 mcg  Intravenous Q Thu-HD  . insulin aspart  0-15 Units Subcutaneous TID WC  . insulin aspart  0-5 Units Subcutaneous QHS  . insulin aspart  5 Units Subcutaneous TID WC  . insulin glargine  25 Units Subcutaneous QHS   . sodium chloride    . norepinephrine (LEVOPHED) Adult infusion    . piperacillin-tazobactam (ZOSYN)  IV Stopped (06/16/17 0538)  . vancomycin Stopped (06/15/17 1756)   Anti-infectives (From admission, onward)   Start     Dose/Rate Route Frequency Ordered Stop   06/15/17 1502  vancomycin (VANCOCIN) 1-5 GM/200ML-% IVPB    Note to Pharmacy:  Herriott, Melisa   : cabinet override      06/15/17 1502 06/15/17 1553   06/15/17 1200  vancomycin (VANCOCIN) IVPB 1000 mg/200 mL premix     1,000 mg 200 mL/hr over 60 Minutes Intravenous Every T-Th-Sa (Hemodialysis) 06/14/17 1256     06/14/17 1330  vancomycin (VANCOCIN) 2,000 mg in sodium chloride 0.9 % 500 mL IVPB     2,000 mg 250 mL/hr over 120 Minutes Intravenous  Once 06/14/17 1256 06/14/17 2229   06/14/17 1130  piperacillin-tazobactam (ZOSYN) IVPB 3.375 g     3.375 g 12.5 mL/hr over 240 Minutes Intravenous Every 12 hours 06/14/17 1057  Assessment/Plan AF/DVT - on Eliquis DCHF/NICM ESRD HD - TTS Cardiorenal syndrome  Diabetes ID - peripheral neuropathy Hypertension RA - on steroids Deconditioning- cardiorenal syndrome Anemia    Sacral ulcer S/pDEBRIDMENT OF SACRAL DECUBITUS ULCER [id number] Dr. Melanee Spry -POD12 - surgical path:DENSELY INFLAMED AND NECROTIC FIBROADIPOSE TISSUE,THERE IS NO EVIDENCE OF MALIGNANCY -TMAX103, CT 4/1 =>> right gluteal abscess=>> Debridement of 70cm^2 tissue involving subcutaneous tissue, fascia and muscle of right gluteal area, 06/14/17 Dr. Lurena Joiner Kinsinger POD 2    ID -ancef perioperative 3/22; Vancomycin 4/19; Lajean Silvius 4/1 =>> day 3 FEN -renal diet VTE -eliquis on hold Foley -none   Plan:  BID dressing changes to both sites and prn.  We will restart hydro Rx  tomorrow and follow with you.  I would get ID involved, we are down to the bone and he most certainly has some osteo with this at the sacral site.  You can restart Eliquis.         LOS: 8 days    John Bailey 06/16/2017 [phone]

## 2017-06-16 NOTE — Progress Notes (Signed)
Pt refuses Q2 turns even after education; pt refuses Tramadol after working with PT. Says he will let RN know if his pain increases. Will continue to monitor.   [place]  Shawntay Prest, RN

## 2017-06-16 NOTE — Progress Notes (Signed)
  [facility] Progress Note    Subjective:   Feels much better   Objective:   BP 106/62 (BP Location: Right Arm)   Pulse 81   Temp 98.1 F (36.7 C) (Oral)   Resp (!) 25   Ht 6' (1.829 m)   Wt 120.7 kg (266 lb)   SpO2 97%   BMI 36.08 kg/m   Intake/Output: I/O last 3 completed shifts: In: 1610 [P.O.:522; I.V.:825; IV Piggyback:200] Out: 0    Intake/Output this shift:  No intake/output data recorded. Weight change: -15.4 kg (-34 lb)  Physical Exam: Gen:NAD CVS: no rub Resp: cta Abd: benign Ext: trace edema, L Cimino AVF +T/B but small caliber.  Labs: BMET Recent Labs  Lab 06/10/17 0508 06/11/17 0437 06/12/17 0358 06/14/17 0752 06/14/17 1644 06/15/17 0240 06/16/17 0259  NA 133* 132* 135 134* 130* 131* 134*  K 4.8 3.9 4.4 4.2 4.2 5.1 5.1  CL 99* 96* 97* 97* 95* 96* 96*  CO2 22 25 24 24 22  21* 22  GLUCOSE 344* 323* 142* 94 127* 172* 152*  BUN 65* 38* 54* 39* 43* 48* 28*  CREATININE 6.30* 4.76* 6.19* 6.90* 7.00* 7.53* 4.85*  ALBUMIN  --   --   --  1.8* 1.7* 1.7*  --   CALCIUM 8.4* 8.3* 8.7* 8.2* 8.0* 8.4* 7.9*   CBC Recent Labs  Lab 06/14/17 0752 06/14/17 1644 06/15/17 0240 06/16/17 0259  WBC 22.6* 19.7* 21.7* 21.4*  NEUTROABS 18.3* 16.7* 20.4* 18.9*  HGB 7.5* 8.1* 7.1* 7.4*  HCT 24.7* 25.6* 23.3* 24.0*  MCV 89.5 89.2 88.9 86.3  PLT 388 330 357 409*    @IMGRELPRIORS @ Medications:    . amiodarone  200 mg Oral Daily  . apixaban  5 mg Oral BID  . calcitRIOL  0.5 mcg Oral Q T,Th,Sat-1800  . darbepoetin (ARANESP) injection - DIALYSIS  150 mcg Intravenous Q Thu-HD  . insulin aspart  0-15 Units Subcutaneous TID WC  . insulin aspart  0-5 Units Subcutaneous QHS  . insulin aspart  5 Units Subcutaneous TID WC  . insulin glargine  25 Units Subcutaneous QHS     Assessment/ Plan:   1. AMS due to gluteal abscess and early sepsis- s/p I&D of abscess with marked improvement in BP and mentation.  He is more arousable today.  Appreciate  surgery's assistance.  2. ESRDcontinue with HD q TTS while he remains an inpatient.  3. Anemia:on ESA 4. CKD-MBD:cont with vit D will need binders and to follow phos when able to take po 5. Nutrition:renal diet 6. Hypertension:stable and continue with UF. 7. Vascular access- LAVF placed 05/10/17, RIJ tdc. Question maturation of AVF.  Will need to f/u with [facility]. 8. Decubitus ulcers- s/p debridement 06/04/17. Continue with wound care 9. A fib- per primary 10. Rheumatoid arthritis flare- on prednisone taper.  Donetta Potts, MD [place] Pager [phone] 06/16/2017, 11:24 AM

## 2017-06-16 NOTE — Care Management Note (Signed)
Case Management Note  Patient Details  Name: John Bailey MRN: [id number] Date of Birth: 1970-03-29  Subjective/Objective:  Pt is s/p I&D for sacral ulcer with osteomyelitis                  Action/Plan:  PTA from CIR - recommendation continues to be for CIR.  CM spoke with CIR liaison ; case will be reviewed for appropriateness.  CM alert CSW for SNF back up plan   Expected Discharge Date:                  Expected Discharge Plan:  [place]  In-House Referral:     Discharge planning Services  CM Consult  Post Acute Care Choice:  Home Health Choice offered to:  Patient  DME Arranged:    DME Agency:     HH Arranged:  PT, RN, OT HH Agency:     Status of Service:  In process, will continue to follow  If discussed at Long Length of Stay Meetings, dates discussed:    Additional Comments:  Maryclare Labrador, RN 06/16/2017, 2:17 PM

## 2017-06-16 NOTE — Progress Notes (Signed)
1 Norco wasted as pt refused it. Says it makes him too sleepy. Marissa as witness.  [place]  Macenzie Burford, RN

## 2017-06-16 NOTE — Progress Notes (Signed)
ANTICOAGULATION CONSULT NOTE -   Pharmacy Consult for apixaban Indication: atrial fibrillation  No Known Allergies  Patient Measurements: Height: 6' (182.9 cm) Weight: 266 lb (120.7 kg) IBW/kg (Calculated) : 77.6  Vital Signs: Temp: 98.1 F (36.7 C) (04/03 0822) Temp Source: Oral (04/03 0822) BP: 106/62 (04/03 0822) Pulse Rate: 81 (04/03 0822)  Labs: Recent Labs    06/14/17 1644 06/15/17 0240 06/16/17 0259  HGB 8.1* 7.1* 7.4*  HCT 25.6* 23.3* 24.0*  PLT 330 357 409*  CREATININE 7.00* 7.53* 4.85*    Estimated Creatinine Clearance: 26.1 mL/min (A) (by C-G formula based on SCr of 4.85 mg/dL (H)).   Medical History: Past Medical History:  Diagnosis Date  . Atrial flutter with rapid ventricular response ([place]) 08/06/2015   a. s/p DCCV in 07/2015 with recurrent atrial fibrillation and DCCV in 12/2016. Initially successful but noted to be back in atrial fibrillation within 2 weeks of DCCV.   [name] Chronic diastolic (congestive) heart failure ([facility])    a. EF 50-55% by echo in 06/2016.  . Diabetes ([place])   . Dysrhythmia    Aflutter  . ESRD (end stage renal disease) on dialysis ([place])    "TTS; at [facility]" (06/08/2017)  . Hypertension   . Peripheral vascular disease ([place])   . Rheumatoid arthritis [facility])      Assessment: 47 yo male w/ gluteal abscess s/p debridement 4/1. She is on apixiban PTA for afib and pharmacy consulted to restart. She is noted with ESRD -Hg= 7.4 (low, stable), plt= 409  Goal of Therapy:  Monitor platelets by anticoagulation protocol: Yes   Plan:  -restart apixaban 5mg  po bid -Will follow patient progress  Hildred Laser, PharmD Clinical Pharmacist Clinical phone from 8:30-4:00 is [phone] After 4pm, please call Main Rx [phone]) for assistance. 06/16/2017 9:06 AM

## 2017-06-16 NOTE — Consult Note (Addendum)
WOC follow-up:  Surgical team following; pt went to surgery for debridement of an abscess on 4/1.  Wound has evolved into stage 4 with exposed bone. Refer to surgical team progress notes and phots for assessment and plan of care.  Hydro is planned to be restarted tomorrow and moist gauze dressings have been ordered BID.  [place] team will continue to assess weekly along with surgical team. [name] Girt MSN, RN, [place], [place], [place]

## 2017-06-16 NOTE — Progress Notes (Addendum)
Pharmacy Antibiotic Note  John Bailey is a 47 y.o. male admitted on 06/08/2017 with osteo and  sacral decubitus ulcer on hydrotherapy.  Pharmacy was been consulted for vancomycin and zosyn. Plans are to change to vancomycin with fortaz -abscess cultures with GPC, GNR, WBC= 21.4, afeb -last HD 4/2  Plan: -Continue vancomycin 1000mg  IV TTS with HD -Fortaz 2gm IV now then 2gm IV with HD TTS -Will follow  cultures and clinical progress   Height: 6' (182.9 cm) Weight: 266 lb (120.7 kg) IBW/kg (Calculated) : 77.6  Temp (24hrs), Avg:97.8 F (36.6 C), Min:97.4 F (36.3 C), Max:98.2 F (36.8 C)  Recent Labs  Lab 06/12/17 0358 06/14/17 0752 06/14/17 1644 06/15/17 0240 06/16/17 0259  WBC 24.5* 22.6* 19.7* 21.7* 21.4*  CREATININE 6.19* 6.90* 7.00* 7.53* 4.85*  LATICACIDVEN  --   --  1.8  --   --     Estimated Creatinine Clearance: 26.1 mL/min (A) (by C-G formula based on SCr of 4.85 mg/dL (H)).    No Known Allergies   Thank you for allowing pharmacy to be a part of this patient's care.  Hildred Laser, PharmD Clinical Pharmacist Clinical phone from 8:30-4:00 is [phone] After 4pm, please call Main Rx [phone]) for assistance. 06/16/2017 9:59 AM

## 2017-06-17 DIAGNOSIS — R000 Tachycardia, unspecified: Secondary | ICD-10-CM

## 2017-06-17 DIAGNOSIS — I480 Paroxysmal atrial fibrillation: Secondary | ICD-10-CM

## 2017-06-17 DIAGNOSIS — M869 Osteomyelitis, unspecified: Secondary | ICD-10-CM

## 2017-06-17 DIAGNOSIS — L89159 Pressure ulcer of sacral region, unspecified stage: Secondary | ICD-10-CM

## 2017-06-17 LAB — BASIC METABOLIC PANEL
ANION GAP: 14 (ref 5–15)
BUN: 35 mg/dL — ABNORMAL HIGH (ref 6–20)
CALCIUM: 7.8 mg/dL — AB (ref 8.9–10.3)
CO2: 25 mmol/L (ref 22–32)
Chloride: 96 mmol/L — ABNORMAL LOW (ref 101–111)
Creatinine, Ser: 6.25 mg/dL — ABNORMAL HIGH (ref 0.61–1.24)
GFR, EST AFRICAN AMERICAN: 11 mL/min — AB (ref >60)
GFR, EST NON AFRICAN AMERICAN: 10 mL/min — AB (ref >60)
GLUCOSE: 115 mg/dL — AB (ref 65–99)
Potassium: 3.6 mmol/L (ref 3.5–5.1)
SODIUM: 135 mmol/L (ref 135–145)

## 2017-06-17 LAB — GLUCOSE, CAPILLARY
Glucose-Capillary: 73 mg/dL (ref 65–99)
Glucose-Capillary: 110 mg/dL — ABNORMAL HIGH (ref 65–99)
Glucose-Capillary: 125 mg/dL — ABNORMAL HIGH (ref 65–99)

## 2017-06-17 LAB — CBC WITH DIFFERENTIAL/PLATELET
BASOS ABS: 0 10*3/uL (ref 0.0–0.1)
Basophils Relative: 0 %
EOS ABS: 0 10*3/uL (ref 0.0–0.7)
Eosinophils Relative: 0 %
HCT: 25.4 % — ABNORMAL LOW (ref 39.0–52.0)
Hemoglobin: 7.8 g/dL — ABNORMAL LOW (ref 13.0–17.0)
LYMPHS ABS: 1.5 10*3/uL (ref 0.7–4.0)
LYMPHS PCT: 8 %
MCH: 26.8 pg (ref 26.0–34.0)
MCHC: 30.7 g/dL (ref 30.0–36.0)
MCV: 87.3 fL (ref 78.0–100.0)
Monocytes Absolute: 1.7 10*3/uL — ABNORMAL HIGH (ref 0.1–1.0)
Monocytes Relative: 9 %
NEUTROS ABS: 15.2 10*3/uL — AB (ref 1.7–7.7)
Neutrophils Relative %: 83 %
PLATELETS: 521 10*3/uL — AB (ref 150–400)
RBC: 2.91 MIL/uL — ABNORMAL LOW (ref 4.22–5.81)
RDW: 21.3 % — ABNORMAL HIGH (ref 11.5–15.5)
WBC: 18.4 10*3/uL — AB (ref 4.0–10.5)

## 2017-06-17 LAB — AEROBIC/ANAEROBIC CULTURE W GRAM STAIN (SURGICAL/DEEP WOUND)

## 2017-06-17 LAB — AEROBIC/ANAEROBIC CULTURE (SURGICAL/DEEP WOUND)

## 2017-06-17 LAB — MAGNESIUM: MAGNESIUM: 1.9 mg/dL (ref 1.7–2.4)

## 2017-06-17 MED ORDER — VANCOMYCIN HCL IN DEXTROSE 1-5 GM/200ML-% IV SOLN
INTRAVENOUS | Status: AC
  Administered 2017-06-17: 11:00:00
  Filled 2017-06-17: qty 200

## 2017-06-17 MED ORDER — DARBEPOETIN ALFA 150 MCG/0.3ML IJ SOSY
PREFILLED_SYRINGE | INTRAMUSCULAR | Status: AC
  Administered 2017-06-17: 150 ug via INTRAVENOUS
  Filled 2017-06-17: qty 0.3

## 2017-06-17 MED ORDER — SODIUM CHLORIDE 0.9 % IV BOLUS
250.0000 mL | Freq: Once | INTRAVENOUS | Status: AC
  Administered 2017-06-17: 250 mL via INTRAVENOUS

## 2017-06-17 NOTE — Progress Notes (Addendum)
Patient ID: John Bailey, male   DOB: 1970-08-06, 47 y.o.   MRN: [id number]  PROGRESS NOTE    John Bailey  YWV:[id number] DOB: May 17, 1970 DOA: 06/08/2017 PCP: [name], MD   Brief Narrative:  47 year old male with history of RA, PVD, hypertension, end-stage renal disease on hemodialysis, A. fib on Eliquis, diabetes and chronic diastolic CHF and sacral and coccyx pressure ulcer who was admitted from 04/22/2017-05/13/2017 with anasarca from acute on chronic diastolic heart failure and A. fib with RVR (cardioverted) and was discharged to inpatient rehab for debility.  He underwent debridement of sacral decubitus ulcer on 06/04/2017 by general surgery.  He was supposed to be discharged from [facility] on 06/09/2017 but general surgery recommended continued inpatient wound care so patient was readmitted on 06/09/2017.  He required debridement of the right gluteal abscess on 06/14/2017 by general surgery.   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  One set of blood culture is growing coagulase-negative staph, probably a contaminant.  -ID following: Continue ceftazidime and vancomycin -Follow further general surgery recommendations: Wound  care as per general surgery -Currently afebrile  Acute metabolic encephalopathy -Probably secondary to above.  Mental status much improved.  CT of the head was negative for acute abnormality  Unstageable Sacral ulcer status post debridement -Debridement was done by Dr. Grandville Silos on 06/04/2017.   -General surgery following.  -Hydrotherapy will be resumed from today as per surgery recommendations.  Dressing changes as per general surgery.  ESRD on HD  -Nephrology following.  Dialysis as per nephrology schedule.  Hypertension -Blood pressure on the lower side.  Coreg on hold.  Monitor  Type II Diabetes  -Continue current dose of Levemir along with NovoLog with meals.  Continue Accu-Cheks with coverage.  Leukocytosis, likely reactive, related to underlying sacral wound and steroids in the setting of RA   -Improving.  Repeat a.m. labs.  Antibiotic plan as above  Anemia of chronic disease  -Hemoglobin stable.  Monitor.  No signs of bleeding.  Atrial Fibrillation CHA2DS2-VASc score 2-3 , on anticoagulation with Eliquis. Echo 04/25/17  EF 85-46, normal systolic  -Status post recent cardioversion during last hospitalization.  Currently rate controlled Continue amiodarone.  Hold Coreg.  Eliquis resumed.  Chronic diastolic heart failure.   -Recent hospitalization for acute decompensated heart failure. -Currently compensated and volume is managed by dialysis.  Hold Coreg.  Outpatient follow-up with cardiology  Rheumatoid arthritis with flare Continue 6-week prednisone taper which was started on 05/19/2017. outpatient follow-up with rheumatology.  Patient had been receiving abatacept every Tuesday prior to admission; will hold it for now.  Deconditioning  -Continue PT/OT  Morbid obesity -Outpatient follow-up  DVT prophylaxis:  Eliquis Code Status:    Full  Family Communication:   Spoke to wife at bedside Disposition  Plan: SNF vs LTAC once clinically improves  Consultants: General  surgery/nephrology/ID  Procedures: None  Antimicrobials: Zosyn from 06/14/2017-06/15/17 Vancomycin from 06/14/2017 onwards Ceftazidime from 06/16/2017 onwards   Subjective: Patient seen and examined at bedside undergoing dialysis.  He denies any overnight fever, nausea or vomiting.    Objective: Vitals:   06/17/17 1000 06/17/17 1030 06/17/17 1054 06/17/17 1057  BP: (!) 88/58 90/73 (!) 88/67 123/69  Pulse: (!) 105 74 74 90  Resp: 19 20 19 20   Temp:    (!) 97.2 F (36.2 C)  TempSrc:    Oral  SpO2:    99%  Weight:    (!) 136.1 kg (300 lb 0.7 oz)  Height:        Intake/Output Summary (Last 24 hours) at 06/17/2017 1129 Last data filed at 06/17/2017 1057 Gross per 24 hour  Intake 480 ml  Output 1732 ml  Net -1252 ml   Filed Weights   06/17/17 0600 06/17/17 0644 06/17/17 1057  Weight: (!) 138 kg (304 lb 3.8 oz) (!) 138 kg (304 lb 3.8 oz) (!) 136.1 kg (300 lb 0.7 oz)    Examination:  General exam: No  distress  respiratory system: Bilateral decreased breath sounds at bases  cardiovascular system: S1-S2 positive, rate controlled gastrointestinal system: Abdomen is obese, nondistended, soft and nontender. Normal bowel sounds heard. Extremities: trace edema; no cyanosis  Data Reviewed: I have personally reviewed following labs and imaging studies  CBC: Recent Labs  Lab 06/14/17 0752 06/14/17 1644 06/15/17 0240 06/16/17 0259 06/17/17 0351  WBC 22.6* 19.7* 21.7* 21.4* 18.4*  NEUTROABS 18.3* 16.7* 20.4* 18.9* 15.2*  HGB 7.5* 8.1* 7.1* 7.4* 7.8*  HCT 24.7* 25.6* 23.3* 24.0* 25.4*  MCV 89.5 89.2 88.9 86.3 87.3  PLT 388 330 357 409* 025*   Basic Metabolic Panel: Recent Labs  Lab 06/12/17 0358 06/14/17 0752 06/14/17 1644 06/15/17 0240 06/16/17 0259 06/17/17 0351  NA 135 134* 130* 131* 134* 135  K 4.4 4.2 4.2 5.1 5.1 3.6  CL 97* 97* 95* 96* 96* 96*  CO2 24 24 22  21* 22 25  GLUCOSE 142* 94 127* 172* 152* 115*  BUN 54* 39* 43* 48* 28* 35*  CREATININE 6.19* 6.90* 7.00*  7.53* 4.85* 6.25*  CALCIUM 8.7* 8.2* 8.0* 8.4* 7.9* 7.8*  MG 1.7 1.6*  --  1.6* 1.9 1.9   GFR: Estimated Creatinine Clearance: 21.5 mL/min (A) (by C-G formula based on SCr of 6.25 mg/dL (H)). Liver Function Tests: Recent Labs  Lab 06/14/17 0752 06/14/17 1644 06/15/17 0240  AST 17 33 23  ALT 10* 14* 16*  ALKPHOS 83 85 96  BILITOT 0.7 1.1 0.8  PROT 5.9* 5.4* 5.7*  ALBUMIN 1.8* 1.7* 1.7*   No results for input(s): LIPASE, AMYLASE in the last 168 hours. Recent Labs  Lab 06/14/17 1644  AMMONIA 23   Coagulation Profile: No results for input(s): INR, PROTIME in the last 168 hours. Cardiac Enzymes: No results for input(s): CKTOTAL, CKMB, CKMBINDEX, TROPONINI in the last 168 hours. BNP (last 3 results) No results for input(s): PROBNP in the last 8760 hours. HbA1C: No results for input(s): HGBA1C in the last 72 hours. CBG: Recent Labs  Lab 06/15/17 2225 06/16/17 0825 06/16/17 1226 06/16/17 1658 06/16/17 2121  GLUCAP 122* 128* 148* 148* 133*   Lipid Profile: No results for input(s): CHOL, HDL, LDLCALC, TRIG, CHOLHDL, LDLDIRECT in the last 72 hours. Thyroid Function Tests: No results for input(s): TSH, T4TOTAL, FREET4, T3FREE, THYROIDAB in the last 72 hours. Anemia  Panel: No results for input(s): VITAMINB12, FOLATE, FERRITIN, TIBC, IRON, RETICCTPCT in the last 72 hours. Sepsis Labs: Recent Labs  Lab 06/14/17 1644  LATICACIDVEN 1.8    Recent Results (from the past 240 hour(s))  MRSA PCR Screening     Status: None   Collection Time: 06/08/17  6:37 PM  Result Value Ref Range Status   MRSA by PCR NEGATIVE NEGATIVE Final    Comment:        The GeneXpert MRSA Assay (FDA approved for NASAL specimens only), is one component of a comprehensive MRSA colonization surveillance program. It is not intended to diagnose MRSA infection nor to guide or monitor treatment for MRSA infections. Performed at [facility], [place] [address], [place], [place] [address]     Culture, blood (routine x 2)     Status: None (Preliminary result)   Collection Time: 06/13/17 11:26 AM  Result Value Ref Range Status   Specimen Description BLOOD RIGHT HAND  Final   Special Requests   Final    IN BOTH AEROBIC AND ANAEROBIC BOTTLES Blood Culture adequate volume   Culture   Final    NO GROWTH 4 DAYS Performed at [facility], [place] [address], [place], [place] [address]    Report Status PENDING  Incomplete  Culture, blood (routine x 2)     Status: Abnormal   Collection Time: 06/13/17 11:26 AM  Result Value Ref Range Status   Specimen Description BLOOD RIGHT HAND  Final   Special Requests   Final    IN BOTH AEROBIC AND ANAEROBIC BOTTLES Blood Culture adequate volume   Culture  Setup Time   Final    GRAM POSITIVE COCCI ANAEROBIC BOTTLE ONLY CRITICAL RESULT CALLED TO, READ BACK BY AND VERIFIED WITH: Karlene Einstein PharmD 14:40 06/14/17 ([email or web address])    Culture (A)  Final    STAPHYLOCOCCUS SPECIES (COAGULASE NEGATIVE) THE SIGNIFICANCE OF ISOLATING THIS ORGANISM FROM A SINGLE SET OF BLOOD CULTURES WHEN MULTIPLE SETS ARE DRAWN IS UNCERTAIN. PLEASE NOTIFY THE MICROBIOLOGY DEPARTMENT WITHIN ONE WEEK IF SPECIATION AND SENSITIVITIES ARE REQUIRED. Performed at [facility], [place] [address] [place], [place] [address]    Report Status 06/16/2017 FINAL  Final  Blood Culture ID Panel (Reflexed)     Status: Abnormal   Collection Time: 06/13/17 11:26 AM  Result Value Ref Range Status   Enterococcus species NOT DETECTED NOT DETECTED Final   Listeria monocytogenes NOT DETECTED NOT DETECTED Final   Staphylococcus species DETECTED (A) NOT DETECTED Final    Comment: Methicillin (oxacillin) resistant coagulase negative staphylococcus. Possible blood culture contaminant (unless isolated from more than one blood culture draw or clinical case suggests pathogenicity). No antibiotic treatment is indicated for blood  culture contaminants. CRITICAL RESULT CALLED TO, READ BACK BY AND  VERIFIED WITH: Karlene Einstein PharmD 14:40 06/14/17 ([email or web address])    Staphylococcus aureus NOT DETECTED NOT DETECTED Final   Methicillin resistance DETECTED (A) NOT DETECTED Final    Comment: CRITICAL RESULT CALLED TO, READ BACK BY AND VERIFIED WITH: Karlene Einstein PharmD 14:40 06/14/17 ([email or web address])    Streptococcus species NOT DETECTED NOT DETECTED Final   Streptococcus agalactiae NOT DETECTED NOT DETECTED Final   Streptococcus pneumoniae NOT DETECTED NOT DETECTED Final   Streptococcus pyogenes NOT DETECTED NOT DETECTED Final   Acinetobacter baumannii NOT DETECTED NOT DETECTED Final   Enterobacteriaceae species NOT DETECTED NOT DETECTED Final   Enterobacter cloacae complex NOT DETECTED NOT DETECTED Final   Escherichia coli NOT DETECTED NOT DETECTED  Final   Klebsiella oxytoca NOT DETECTED NOT DETECTED Final   Klebsiella pneumoniae NOT DETECTED NOT DETECTED Final   Proteus species NOT DETECTED NOT DETECTED Final   Serratia marcescens NOT DETECTED NOT DETECTED Final   Haemophilus influenzae NOT DETECTED NOT DETECTED Final   Neisseria meningitidis NOT DETECTED NOT DETECTED Final   Pseudomonas aeruginosa NOT DETECTED NOT DETECTED Final   Candida albicans NOT DETECTED NOT DETECTED Final   Candida glabrata NOT DETECTED NOT DETECTED Final   Candida krusei NOT DETECTED NOT DETECTED Final   Candida parapsilosis NOT DETECTED NOT DETECTED Final   Candida tropicalis NOT DETECTED NOT DETECTED Final    Comment: Performed at [facility], [place] [address] [place], [place] [address]  Aerobic/Anaerobic Culture (surgical/deep wound)     Status: None (Preliminary result)   Collection Time: 06/14/17  7:52 PM  Result Value Ref Range Status   Specimen Description ABSCESS GLUTEAL  Final   Special Requests PATIENT ON FOLLOWING ZOSYN AND VANC  Final   Gram Stain   Final    FEW WBC PRESENT, PREDOMINANTLY PMN FEW GRAM POSITIVE COCCI FEW GRAM NEGATIVE RODS RARE GRAM POSITIVE RODS    Culture   Final    CULTURE  REINCUBATED FOR BETTER GROWTH Performed at [facility], [place] [address], [place], [place] [address]    Report Status PENDING  Incomplete  MRSA PCR Screening     Status: None   Collection Time: 06/15/17 12:48 AM  Result Value Ref Range Status   MRSA by PCR NEGATIVE NEGATIVE Final    Comment:        The GeneXpert MRSA Assay (FDA approved for NASAL specimens only), is one component of a comprehensive MRSA colonization surveillance program. It is not intended to diagnose MRSA infection nor to guide or monitor treatment for MRSA infections. Performed at [facility], [place] [address], [place], [name] [address]   Culture, blood (routine x 2)     Status: None (Preliminary result)   Collection Time: 06/16/17 10:01 AM  Result Value Ref Range Status   Specimen Description BLOOD RIGHT ANTECUBITAL  Final   Special Requests   Final    BOTTLES DRAWN AEROBIC AND ANAEROBIC Blood Culture results may not be optimal due to an excessive volume of blood received in culture bottles   Culture   Final    NO GROWTH < 24 HOURS Performed at [facility] [address] [place], [place] [address]    Report Status PENDING  Incomplete  Culture, blood (routine x 2)     Status: None (Preliminary result)   Collection Time: 06/16/17 10:01 AM  Result Value Ref Range Status   Specimen Description BLOOD RIGHT HAND  Final   Special Requests   Final    BOTTLES DRAWN AEROBIC AND ANAEROBIC Blood Culture results may not be optimal due to an inadequate volume of blood received in culture bottles   Culture   Final    NO GROWTH < 24 HOURS Performed at [facility], [place] [address] [place], [place] [address]    Report Status PENDING  Incomplete         Radiology Studies: No results found.      Scheduled Meds: . amiodarone  200 mg Oral Daily  . apixaban  5 mg Oral BID  . calcitRIOL  0.5 mcg Oral Q T,Th,Sat-1800  . darbepoetin (ARANESP) injection - DIALYSIS  150 mcg Intravenous Q  Thu-HD  . insulin aspart  0-15 Units Subcutaneous TID WC  . insulin aspart  0-5 Units Subcutaneous QHS  . insulin aspart  5 Units Subcutaneous TID WC  . insulin glargine  25 Units Subcutaneous QHS   Continuous Infusions: . sodium chloride    . cefTAZidime (FORTAZ)  IV    . norepinephrine (LEVOPHED) Adult infusion    . vancomycin Stopped (06/15/17 1756)     LOS: 9 days        Aline August, MD [facility] Pager [phone]  If 7PM-7AM, please contact night-coverage [email or web address] Password [facility] 06/17/2017, 11:29 AM

## 2017-06-17 NOTE — Progress Notes (Signed)
Hydrotherapy scheduled for 1400; general surgery paged.   [place]  Draylen Lobue, RN

## 2017-06-17 NOTE — Progress Notes (Signed)
PT Cancellation Note  Patient Details Name: KEDRIC BUMGARNER MRN: [id number] DOB: 1970/12/17   Cancelled Treatment:    Reason Eval/Treat Not Completed: Patient at procedure or test/unavailable (HD). Will follow-up for PT treatment as schedule permits.  Mabeline Caras, PT, DPT Acute Rehab Services  Pager: [place] 06/17/2017, 8:22 AM

## 2017-06-17 NOTE — Progress Notes (Signed)
Pharmacy Antibiotic Note  John Bailey is a 47 y.o. male admitted on 06/08/2017 with osteo and  sacral decubitus ulcer on hydrotherapy.  Pharmacy was been consulted for vancomycin and  fortaz -abscess cultures with morganella morganii (sens to rocephin) -last HD 4/4, cultures from 4/3 pending  Spoke with Dr. Johnnye Sima: may discontinue vancomycin   Plan: -d/c vancomycin -Continue Fortaz  2gm IV with HD TTS -Will follow  new cultures and clinical progress   Height: 6' (182.9 cm) Weight: (!) 300 lb 0.7 oz (136.1 kg) IBW/kg (Calculated) : 77.6  Temp (24hrs), Avg:97.8 F (36.6 C), Min:97.1 F (36.2 C), Max:99.2 F (37.3 C)  Recent Labs  Lab 06/14/17 0752 06/14/17 1644 06/15/17 0240 06/16/17 0259 06/17/17 0351  WBC 22.6* 19.7* 21.7* 21.4* 18.4*  CREATININE 6.90* 7.00* 7.53* 4.85* 6.25*  LATICACIDVEN  --  1.8  --   --   --     Estimated Creatinine Clearance: 21.5 mL/min (A) (by C-G formula based on SCr of 6.25 mg/dL (H)).    No Known Allergies   Thank you for allowing pharmacy to be a part of this patient's care.  Hildred Laser, PharmD Clinical Pharmacist Clinical phone from 8:30-4:00 is [phone] After 4pm, please call Main Rx [phone]) for assistance. 06/17/2017 11:53 AM

## 2017-06-17 NOTE — Progress Notes (Signed)
I was present at this dialysis session. I have reviewed the session itself and made appropriate changes.   Filed Weights   06/16/17 0620 06/17/17 0600 06/17/17 0644  Weight: 120.7 kg (266 lb) (!) 138 kg (304 lb 3.8 oz) (!) 138 kg (304 lb 3.8 oz)    Recent Labs  Lab 06/17/17 0351  NA 135  K 3.6  CL 96*  CO2 25  GLUCOSE 115*  BUN 35*  CREATININE 6.25*  CALCIUM 7.8*    Recent Labs  Lab 06/15/17 0240 06/16/17 0259 06/17/17 0351  WBC 21.7* 21.4* 18.4*  NEUTROABS 20.4* 18.9* 15.2*  HGB 7.1* 7.4* 7.8*  HCT 23.3* 24.0* 25.4*  MCV 88.9 86.3 87.3  PLT 357 409* 521*    Scheduled Meds: . amiodarone  200 mg Oral Daily  . apixaban  5 mg Oral BID  . calcitRIOL  0.5 mcg Oral Q T,Th,Sat-1800  . darbepoetin (ARANESP) injection - DIALYSIS  150 mcg Intravenous Q Thu-HD  . insulin aspart  0-15 Units Subcutaneous TID WC  . insulin aspart  0-5 Units Subcutaneous QHS  . insulin aspart  5 Units Subcutaneous TID WC  . insulin glargine  25 Units Subcutaneous QHS   Continuous Infusions: . sodium chloride    . cefTAZidime (FORTAZ)  IV    . norepinephrine (LEVOPHED) Adult infusion    . vancomycin Stopped (06/15/17 1756)   PRN Meds:.acetaminophen **OR** acetaminophen, bisacodyl, HYDROcodone-acetaminophen, methocarbamol, naloxone, ondansetron **OR** ondansetron (ZOFRAN) IV, senna-docusate, traMADol    Assessment/Plan: 1. AMSdue to gluteal abscess and early sepsis-s/p I&D of abscess with marked improvement in BP and mentation. He is more arousable today. Appreciate surgery's assistance.  2. ESRDcontinue with HD q TTS while he remains an inpatient.  3. Anemia:on ESA 4. CKD-MBD:cont with vit D will need binders and to follow phos when able to take po 5. Nutrition:renal diet 6. Hypertension:stable and continue with UF. 7. Vascular access- LAVF placed 05/10/17, RIJ tdc. Question maturation of AVF.  Will need to f/u with [facility]. 8. Decubitus ulcers- s/p debridement 06/04/17. Continue with  wound care 9. A fib- per primary 10. Rheumatoid arthritis flare- on prednisone taper. 11. Deconditioning- continue with PT/OT     Donetta Potts,  MD 06/17/2017, 8:25 AM

## 2017-06-17 NOTE — Care Management (Signed)
Update:  CM again text paged surgery PA  Pt discussed in LOS 06/17/17 - pt remains appropriate for continued stay.    LTACH referral given - both agencies given referral - only [facility] offered bed.  CM discussed LTACH referral with attending - attending deferred decision to surgery.  CM text paged surgery group during 10 oclock hour

## 2017-06-17 NOTE — Progress Notes (Signed)
Physical Therapy Wound Re-Evaluation and Treatment Patient Details  Name: John Bailey MRN: [id number] Date of Birth: [birth year]  Today's Date: 06/17/2017 Time: 0175-1025 Time Calculation (min): 51 min  Subjective  Subjective: pt agrees to allow nursing to reposition him to keep him off his back  Patient and Family Stated Goals: heal wounds  Prior Treatments: Surgical I&D 06/04/17 and 06/14/17  Pain Score: Pt premedicated prior to treatment. 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 after surgery on 4/1 (Active)  Dressing Type ABD;Gauze (Comment);Moist to dry;Barrier Film (skin prep) 06/17/2017  4:00 PM  Dressing Changed 06/17/2017  4:00 PM  Dressing Change Frequency Twice a day 06/17/2017  4:00 PM  State of Healing Early/partial granulation 06/17/2017  4:00 PM  Site / Wound Assessment Pink;Yellow;Painful;Granulation tissue 06/17/2017  4:00 PM  % Wound base Red or Granulating 70% 06/17/2017  4:00 PM  % Wound base Yellow/Fibrinous Exudate 30% 06/17/2017  4:00 PM  Peri-wound Assessment Pink;Intact 06/16/2017  7:40 AM  Wound Length (cm) 8.5 cm 06/17/2017  4:00 PM  Wound Width (cm) 6 cm 06/17/2017  4:00 PM  Wound Depth (cm) 5 cm 06/17/2017  4:00 PM  Wound Surface Area (cm^2) 51 cm^2 06/17/2017  4:00 PM  Wound Volume (cm^3) 255 cm^3 06/17/2017  4:00 PM  Tunneling (cm) 4 cm 3-4 o'clock 06/17/2017  4:00 PM  Undermining (cm) 1 cm 12-3 o'clock  06/17/2017  4:00 PM  Margins Unattached edges (unapproximated) 06/17/2017  4:00 PM  Drainage Amount Minimal 06/17/2017  4:00 PM  Drainage Description Serosanguineous 06/17/2017  4:00 PM  Treatment Hydrotherapy (Pulse lavage);Packing (Saline gauze) 06/17/2017  4:00 PM  Wound / Incision (Open or Dehisced) 06/17/17 Incision - Open Buttocks Right;Lateral R gluteal surgical debridement of abcess  (Active)  Dressing Type  ABD;Gauze (Comment);Moist to dry;Barrier Film (skin prep) 06/17/2017  4:00 PM  Dressing Changed Changed 06/17/2017  4:00 PM  Dressing Status New drainage 06/17/2017  4:00 PM  Dressing Change Frequency Twice a day 06/17/2017  4:00 PM  Site / Wound Assessment Bleeding;Red;Granulation tissue 06/17/2017  4:00 PM  % Wound base Red or Granulating 100% 06/17/2017  4:00 PM  Wound Length (cm) 9 cm 06/17/2017  4:00 PM  Wound Width (cm) 7 cm 06/17/2017  4:00 PM  Wound Depth (cm) 5 cm 06/17/2017  4:00 PM  Wound Volume (cm^3) 315 cm^3 06/17/2017  4:00 PM  Wound Surface Area (cm^2) 63 cm^2 06/17/2017  4:00 PM  Closure None 06/17/2017  4:00 PM  Drainage Amount Moderate 06/17/2017  4:00 PM  Drainage Description Serous 06/17/2017  4:00 PM  Treatment Packing (Saline gauze) 06/17/2017  4:00 PM      Hydrotherapy Pulsed lavage therapy - wound location: sacrum Pulsed Lavage with Suction (psi): 4 psi(4 psi due to increased pain) Pulsed Lavage with Suction - Normal Saline Used: 1000 mL Pulsed Lavage Tip: Tip with splash shield   Wound Assessment and Plan  Wound Therapy - Assess/Plan/Recommendations Wound Therapy - Clinical Statement: Pt had minimal amount of yellow slough at base of wound which was removed with pulse lavage. No need for selective debridement at this time. Pt will continue to benefit from pulsed lavage to decrease bioburden in sacral wound and aid in healing.  Pt gluteal wound bleeding with unpacking no presense of slough, surgical PA does not think hydrotherapy of  this wound is warranted at this time.  Factors Delaying/Impairing Wound Healing: Immobility;Multiple medical problems;Diabetes Mellitus Hydrotherapy Plan: Pulsatile lavage with suction;Patient/family education;Dressing change Wound Therapy - Frequency: 6X / week Wound Therapy - Follow Up Recommendations: Skilled nursing facility Wound Plan: see above  Wound Therapy Goals- Improve the function of patient's integumentary system by progressing the wound(s)  through the phases of wound healing (inflammation - proliferation - remodeling) by: Decrease Necrotic Tissue to: 20 Decrease Necrotic Tissue - Progress: Goal set today Increase Granulation Tissue to: 80 Increase Granulation Tissue - Progress: Goal set today Goals/treatment plan/discharge plan were made with and agreed upon by patient/family: Yes Time For Goal Achievement: 7 days Wound Therapy - Potential for Goals: Fair  Goals will be updated until maximal potential achieved or discharge criteria met.  Discharge criteria: when goals achieved, discharge from hospital, MD decision/surgical intervention, no progress towards goals, refusal/missing three consecutive treatments without notification or medical reason.  GP    Dani Gobble. [name] PT, DPT Acute Rehabilitation  [phone] Pager [phone]   [place] 06/17/2017, 4:33 PM

## 2017-06-17 NOTE — Progress Notes (Signed)
[facility] for Infectious Disease  Date of Admission:  06/08/2017             ASSESSMENT/PLAN  John Bailey continues to be treated for a sacral ulcer and osteomyelitis. Repeat blood cultures are no growth in <24 hours. Surgical cultures remain pending. He continues to receive vancomycin and ceftazidime without adverse side effects. He has remained afebrile.   1. Continue vancomycin and ceftazidime with dialysis. Narrowing as able pending surgical culture results.  2. Continue to monitor blood cultures. 3. Will likely require at least 6 weeks of antibiotics with dialysis for the sacral osteomyelitis. 4. We will continue to follow.   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 amiodarone  200 mg Oral Daily  . apixaban  5 mg Oral BID  . calcitRIOL  0.5 mcg Oral Q T,Th,Sat-1800  . darbepoetin (ARANESP) injection - DIALYSIS  150 mcg Intravenous Q Thu-HD  . insulin aspart  0-15 Units Subcutaneous TID WC  . insulin aspart  0-5 Units Subcutaneous QHS  . insulin aspart  5 Units Subcutaneous TID WC  . insulin glargine  25 Units Subcutaneous QHS    SUBJECTIVE:  Afebrile overnight. Blood cultures with no growth <24 hours. Surgical cultures re-incubated for better growth and pending. No concerns overnight. Denies fevers, chills, night sweats. Patient seen in dialysis this morning.   No Known  Allergies   Review of Systems: Review of Systems  Constitutional: Negative for chills and fever.  Respiratory: Negative for cough, shortness of breath and wheezing.   Cardiovascular: Negative for chest pain.  Skin: Negative for rash.  Neurological: Negative for dizziness and weakness.      OBJECTIVE: Vitals:   06/17/17 0807 06/17/17 0830 06/17/17 0900 06/17/17 0930  BP: 104/84 (!) 97/34 (!) 89/44 (!) 91/45  Pulse: (!) 114 (!) 111 (!) 106 (!) 108  Resp: 19 20 19 20   Temp:      TempSrc:      SpO2:      Weight:      Height:       Body mass index is 41.26 kg/m.  Physical Exam  Constitutional: He is oriented to person, place, and time and well-developed, well-nourished, and in no distress. No distress.  Lying in bed receiving dialysis. Pleasant.   Cardiovascular: Intact distal pulses. An irregular rhythm present. Tachycardia present. Exam reveals no gallop and no friction rub.  No murmur heard. Pulmonary/Chest: Effort normal and breath sounds normal. No respiratory distress. He has no wheezes. He has no rales. He exhibits no tenderness.  Neurological: He is alert and oriented to person, place, and time.  Skin: Skin is warm and dry. No rash noted.  Wound dressing is clean, dry and intact.   Psychiatric: Affect and judgment normal.    Lab Results Lab Results  Component Value Date   WBC 18.4 (H) 06/17/2017   HGB 7.8 (L) 06/17/2017   HCT 25.4 (L) 06/17/2017  MCV 87.3 06/17/2017   PLT 521 (H) 06/17/2017    Lab Results  Component Value Date   CREATININE 6.25 (H) 06/17/2017   BUN 35 (H) 06/17/2017   NA 135 06/17/2017   K 3.6 06/17/2017   CL 96 (L) 06/17/2017   CO2 25 06/17/2017    Lab Results  Component Value Date   ALT 16 (L) 06/15/2017   AST 23 06/15/2017   ALKPHOS 96 06/15/2017   BILITOT 0.8 06/15/2017     Microbiology: Recent Results (from the past 240 hour(s))  MRSA PCR Screening     Status: None   Collection Time: 06/08/17  6:37 PM  Result Value Ref  Range Status   MRSA by PCR NEGATIVE NEGATIVE Final    Comment:        The GeneXpert MRSA Assay (FDA approved for NASAL specimens only), is one component of a comprehensive MRSA colonization surveillance program. It is not intended to diagnose MRSA infection nor to guide or monitor treatment for MRSA infections. Performed at [facility], [place] [address], [place], [place] [address]   Culture, blood (routine x 2)     Status: None (Preliminary result)   Collection Time: 06/13/17 11:26 AM  Result Value Ref Range Status   Specimen Description BLOOD RIGHT HAND  Final   Special Requests   Final    IN BOTH AEROBIC AND ANAEROBIC BOTTLES Blood Culture adequate volume   Culture   Final    NO GROWTH 4 DAYS Performed at [facility], [place] [address], [place], [place] [address]    Report Status PENDING  Incomplete  Culture, blood (routine x 2)     Status: Abnormal   Collection Time: 06/13/17 11:26 AM  Result Value Ref Range Status   Specimen Description BLOOD RIGHT HAND  Final   Special Requests   Final    IN BOTH AEROBIC AND ANAEROBIC BOTTLES Blood Culture adequate volume   Culture  Setup Time   Final    GRAM POSITIVE COCCI ANAEROBIC BOTTLE ONLY CRITICAL RESULT CALLED TO, READ BACK BY AND VERIFIED WITH: Karlene Einstein PharmD 14:40 06/14/17 ([email or web address])    Culture (A)  Final    STAPHYLOCOCCUS SPECIES (COAGULASE NEGATIVE) THE SIGNIFICANCE OF ISOLATING THIS ORGANISM FROM A SINGLE SET OF BLOOD CULTURES WHEN MULTIPLE SETS ARE DRAWN IS UNCERTAIN. PLEASE NOTIFY THE MICROBIOLOGY DEPARTMENT WITHIN ONE WEEK IF SPECIATION AND SENSITIVITIES ARE REQUIRED. Performed at [facility], [place] [address], [place], [place] [address]    Report Status 06/16/2017 FINAL  Final  Blood Culture ID Panel (Reflexed)     Status: Abnormal   Collection Time: 06/13/17 11:26 AM  Result Value Ref Range Status   Enterococcus species NOT DETECTED NOT DETECTED Final   Listeria monocytogenes NOT DETECTED NOT  DETECTED Final   Staphylococcus species DETECTED (A) NOT DETECTED Final    Comment: Methicillin (oxacillin) resistant coagulase negative staphylococcus. Possible blood culture contaminant (unless isolated from more than one blood culture draw or clinical case suggests pathogenicity). No antibiotic treatment is indicated for blood  culture contaminants. CRITICAL RESULT CALLED TO, READ BACK BY AND VERIFIED WITH: Karlene Einstein PharmD 14:40 06/14/17 ([email or web address])    Staphylococcus aureus NOT DETECTED NOT DETECTED Final   Methicillin resistance DETECTED (A) NOT DETECTED Final    Comment: CRITICAL RESULT CALLED TO, READ BACK BY AND VERIFIED WITH: Karlene Einstein PharmD 14:40 06/14/17 ([email or web address])    Streptococcus species NOT DETECTED NOT DETECTED Final   Streptococcus agalactiae NOT DETECTED NOT DETECTED  Final   Streptococcus pneumoniae NOT DETECTED NOT DETECTED Final   Streptococcus pyogenes NOT DETECTED NOT DETECTED Final   Acinetobacter baumannii NOT DETECTED NOT DETECTED Final   Enterobacteriaceae species NOT DETECTED NOT DETECTED Final   Enterobacter cloacae complex NOT DETECTED NOT DETECTED Final   Escherichia coli NOT DETECTED NOT DETECTED Final   Klebsiella oxytoca NOT DETECTED NOT DETECTED Final   Klebsiella pneumoniae NOT DETECTED NOT DETECTED Final   Proteus species NOT DETECTED NOT DETECTED Final   Serratia marcescens NOT DETECTED NOT DETECTED Final   Haemophilus influenzae NOT DETECTED NOT DETECTED Final   Neisseria meningitidis NOT DETECTED NOT DETECTED Final   Pseudomonas aeruginosa NOT DETECTED NOT DETECTED Final   Candida albicans NOT DETECTED NOT DETECTED Final   Candida glabrata NOT DETECTED NOT DETECTED Final   Candida krusei NOT DETECTED NOT DETECTED Final   Candida parapsilosis NOT DETECTED NOT DETECTED Final   Candida tropicalis NOT DETECTED NOT DETECTED Final    Comment: Performed at [facility], [place] [address], [place], [place] [address]  Aerobic/Anaerobic Culture  (surgical/deep wound)     Status: None (Preliminary result)   Collection Time: 06/14/17  7:52 PM  Result Value Ref Range Status   Specimen Description ABSCESS GLUTEAL  Final   Special Requests PATIENT ON FOLLOWING ZOSYN AND VANC  Final   Gram Stain   Final    FEW WBC PRESENT, PREDOMINANTLY PMN FEW GRAM POSITIVE COCCI FEW GRAM NEGATIVE RODS RARE GRAM POSITIVE RODS    Culture   Final    CULTURE REINCUBATED FOR BETTER GROWTH Performed at [facility], [place] [address], [place], [place] [address]    Report Status PENDING  Incomplete  MRSA PCR Screening     Status: None   Collection Time: 06/15/17 12:48 AM  Result Value Ref Range Status   MRSA by PCR NEGATIVE NEGATIVE Final    Comment:        The GeneXpert MRSA Assay (FDA approved for NASAL specimens only), is one component of a comprehensive MRSA colonization surveillance program. It is not intended to diagnose MRSA infection nor to guide or monitor treatment for MRSA infections. Performed at [facility], [place] [address] [place], [place] [address]   Culture, blood (routine x 2)     Status: None (Preliminary result)   Collection Time: 06/16/17 10:01 AM  Result Value Ref Range Status   Specimen Description BLOOD RIGHT ANTECUBITAL  Final   Special Requests   Final    BOTTLES DRAWN AEROBIC AND ANAEROBIC Blood Culture results may not be optimal due to an excessive volume of blood received in culture bottles   Culture   Final    NO GROWTH < 24 HOURS Performed at [place] [address] [place], [place] [address]    Report Status PENDING  Incomplete  Culture, blood (routine x 2)     Status: None (Preliminary result)   Collection Time: 06/16/17 10:01 AM  Result Value Ref Range Status   Specimen Description BLOOD RIGHT HAND  Final   Special Requests   Final    BOTTLES DRAWN AEROBIC AND ANAEROBIC Blood Culture results may not be optimal due to an inadequate volume of blood received in culture bottles    Culture   Final    NO GROWTH < 24 HOURS Performed at [facility], [place] [address], [place], [place] [address]    Report Status PENDING  Incomplete     Terri Piedra,  NP [facility] Infect[facility] [phone] Pager  06/17/2017  9:58 AM

## 2017-06-17 NOTE — Progress Notes (Signed)
[facility] Progress Note  3 Days Post-Op  Subjective: CC-  Patient states that he has gotten up OOB today. About to start hydrotherapy.  Objective: Vital signs in last 24 hours: Temp:  [97.1 F (36.2 C)-99.2 F (37.3 C)] 99.2 F (37.3 C) (04/04 1142) Pulse Rate:  [74-124] 111 (04/04 1142) Resp:  [16-20] 20 (04/04 1057) BP: (82-128)/(34-84) 101/43 (04/04 1142) SpO2:  [92 %-99 %] 92 % (04/04 1150) Weight:  [300 lb 0.7 oz (136.1 kg)-304 lb 3.8 oz (138 kg)] 300 lb 0.7 oz (136.1 kg) (04/04 1057) Last BM Date: 06/16/17  Intake/Output from previous day: 04/03 0701 - 04/04 0700 In: 598 [P.O.:598] Out: 0  Intake/Output this shift: Total I/O In: -  Out: 1732 [Other:1732]  PE: Gen:  Alert, NAD, pleasant HEENT: EOM's intact, pupils equal and round Pulm:  effort normal GU: Sacral wound:   Right gluteal wound:     Lab Results:  Recent Labs    06/16/17 0259 06/17/17 0351  WBC 21.4* 18.4*  HGB 7.4* 7.8*  HCT 24.0* 25.4*  PLT 409* 521*   BMET Recent Labs    06/16/17 0259 06/17/17 0351  NA 134* 135  K 5.1 3.6  CL 96* 96*  CO2 22 25  GLUCOSE 152* 115*  BUN 28* 35*  CREATININE 4.85* 6.25*  CALCIUM 7.9* 7.8*   PT/INR No results for input(s): LABPROT, INR in the last 72 hours. CMP     Component Value Date/Time   NA 135 06/17/2017 0351   K 3.6 06/17/2017 0351   CL 96 (L) 06/17/2017 0351   CO2 25 06/17/2017 0351   GLUCOSE 115 (H) 06/17/2017 0351   BUN 35 (H) 06/17/2017 0351   CREATININE 6.25 (H) 06/17/2017 0351   CALCIUM 7.8 (L) 06/17/2017 0351   PROT 5.7 (L) 06/15/2017 0240   ALBUMIN 1.7 (L) 06/15/2017 0240   AST 23 06/15/2017 0240   ALT 16 (L) 06/15/2017 0240   ALKPHOS 96 06/15/2017 0240   BILITOT 0.8 06/15/2017 0240   GFRNONAA 10 (L) 06/17/2017 0351   GFRAA 11 (L) 06/17/2017 0351   Lipase  No results found for: LIPASE     Studies/Results: No results found.  Anti-infectives: Anti-infectives (From admission, onward)   Start      Dose/Rate Route Frequency Ordered Stop   06/17/17 1200  cefTAZidime (FORTAZ) 2 g in sodium chloride 0.9 % 100 mL IVPB     2 g 200 mL/hr over 30 Minutes Intravenous Every T-Th-Sa (Hemodialysis) 06/16/17 1001     06/17/17 0837  vancomycin (VANCOCIN) 1-5 GM/200ML-% IVPB    Note to Pharmacy:  Louretta Shorten   : cabinet override      06/17/17 0837 06/17/17 1123   06/16/17 1100  cefTAZidime (FORTAZ) 2 g in sodium chloride 0.9 % 100 mL IVPB     2 g 200 mL/hr over 30 Minutes Intravenous  Once 06/16/17 1001 06/16/17 1137   06/15/17 1502  vancomycin (VANCOCIN) 1-5 GM/200ML-% IVPB    Note to Pharmacy:  Herriott, Melisa   : cabinet override      06/15/17 1502 06/15/17 1553   06/15/17 1200  vancomycin (VANCOCIN) IVPB 1000 mg/200 mL premix  Status:  Discontinued     1,000 mg 200 mL/hr over 60 Minutes Intravenous Every T-Th-Sa (Hemodialysis) 06/14/17 1256 06/17/17 1153   06/14/17 1330  vancomycin (VANCOCIN) 2,000 mg in sodium chloride 0.9 % 500 mL IVPB     2,000 mg 250 mL/hr over 120 Minutes Intravenous  Once 06/14/17 1256 06/14/17  2229   06/14/17 1130  piperacillin-tazobactam (ZOSYN) IVPB 3.375 g  Status:  Discontinued     3.375 g 12.5 mL/hr over 240 Minutes Intravenous Every 12 hours 06/14/17 1057 06/16/17 0936       Assessment/Plan AF/DVT - on Eliquis DCHF/NICM ESRD HD - TTS Cardiorenal syndrome Diabetes ID - peripheral neuropathy Hypertension RA - on steroids Deconditioning- cardiorenal syndrome Anemia   Sacral ulcer S/pDEBRIDMENT OF SACRAL DECUBITUS ULCER [id number] Dr. Melanee Spry - surgical path:DENSELY INFLAMED AND NECROTIC FIBROADIPOSE TISSUE,THERE IS NO EVIDENCE OF MALIGNANCY Right gluteal abscess S/p Debridement of 70cm^2 tissue involving subcutaneous tissue, fascia and muscle of right gluteal area, 06/14/17 Dr. Gurney Maxin  - surgical path: DENSELY INFLAMED FIBROADIPOSE TISSUE WITH SKELETAL MUSCLE. - culture: MORGANELLA MORGANII - per ID will likely require  at least 6 weeks of antibiotics with dialysis for the sacral osteomyelitis  ID -fortaz 4/3>>, Vancomycin 4/1>>; Zosyn 4/1>>4/3; ancef perioperative3/22 FEN -renal diet VTE -eliquis Foley -none  Plan:  Continue hydrotherapy to sacral wound and BID wet to dry dressing changes to both sites and prn. Appreciate ID recommendations.   LOS: 9 days    Wellington Hampshire , [facility] 06/17/2017, 2:25 PM Pager: [phone] Consults: [phone] Mon-Fri 7:00 am-4:30 pm Sat-Sun 7:00 am-11:30 am

## 2017-06-17 NOTE — Progress Notes (Signed)
Pt remains in a drowsy state post Tramadol; pt stated earlier in the day that pain meds often make him quite sleepy. Notified MD of drowsiness. Will continue to monitor.   [place]  Natalyn Szymanowski, RN

## 2017-06-17 NOTE — Progress Notes (Signed)
Pt given 250 bolus of NS; tylenol via rectum; on 2L O2; cleaned up after BM; bed linens changed; shifted up and repositioned; BP currently 102/57 ; will continue to monitor.   [place]  Micheal Sheen, RN

## 2017-06-17 NOTE — Progress Notes (Signed)
MD called back; will maintain monitoring as vitals continue to be stable.   [place]  Mauriah Mcmillen, RN

## 2017-06-17 NOTE — Progress Notes (Signed)
Inpatient Rehabilitation  Receive call from case manager, Aldona Bar with request to review case for potential re-admission to IP Rehab now that patient is debilitated again.  Patient was know to our service with recent admission 05/13/17 and discharge 06/08/17 to acute or wound management due to having met all therapy goals at a Supervision-Min A level.  Discussed case with Dr. Posey Pronto and given that patient is now Max A +2 for transfers he is recommending SNF for post acute rehab.    Carmelia Roller., CCC/SLP Admission Coordinator  [place]  Cell [phone]

## 2017-06-17 NOTE — Progress Notes (Signed)
HD completed without issue. Unable to meet uf goal due to low bp throughout treatment. Post rinseback bp stable at 123/69. Total UF 1.7L. Patient received vancomycin and Aranesp as ordered with treatment. Report called to primary RN. Patient left unit in stable condition.

## 2017-06-18 ENCOUNTER — Encounter (HOSPITAL_BASED_OUTPATIENT_CLINIC_OR_DEPARTMENT_OTHER): Payer: BLUE CROSS/BLUE SHIELD

## 2017-06-18 DIAGNOSIS — R509 Fever, unspecified: Secondary | ICD-10-CM

## 2017-06-18 DIAGNOSIS — L89150 Pressure ulcer of sacral region, unstageable: Secondary | ICD-10-CM

## 2017-06-18 DIAGNOSIS — D649 Anemia, unspecified: Secondary | ICD-10-CM

## 2017-06-18 DIAGNOSIS — B9689 Other specified bacterial agents as the cause of diseases classified elsewhere: Secondary | ICD-10-CM

## 2017-06-18 LAB — ABO/RH: ABO/RH(D): A POS

## 2017-06-18 LAB — CBC WITH DIFFERENTIAL/PLATELET
BASOS ABS: 0 10*3/uL (ref 0.0–0.1)
Basophils Relative: 0 %
Eosinophils Absolute: 0 10*3/uL (ref 0.0–0.7)
Eosinophils Relative: 0 %
HCT: 20.5 % — ABNORMAL LOW (ref 39.0–52.0)
HEMOGLOBIN: 6.6 g/dL — AB (ref 13.0–17.0)
LYMPHS PCT: 6 %
Lymphs Abs: 1.9 10*3/uL (ref 0.7–4.0)
MCH: 29.1 pg (ref 26.0–34.0)
MCHC: 32.2 g/dL (ref 30.0–36.0)
MCV: 90.3 fL (ref 78.0–100.0)
MONOS PCT: 8 %
Monocytes Absolute: 2.5 10*3/uL — ABNORMAL HIGH (ref 0.1–1.0)
NEUTROS ABS: 26.5 10*3/uL — AB (ref 1.7–7.7)
NEUTROS PCT: 86 %
PLATELETS: 452 10*3/uL — AB (ref 150–400)
RBC: 2.27 MIL/uL — AB (ref 4.22–5.81)
RDW: 21.7 % — ABNORMAL HIGH (ref 11.5–15.5)
WBC: 30.9 10*3/uL — AB (ref 4.0–10.5)

## 2017-06-18 LAB — GLUCOSE, CAPILLARY
GLUCOSE-CAPILLARY: 139 mg/dL — AB (ref 65–99)
GLUCOSE-CAPILLARY: 121 mg/dL — AB (ref 65–99)
Glucose-Capillary: 147 mg/dL — ABNORMAL HIGH (ref 65–99)
Glucose-Capillary: 118 mg/dL — ABNORMAL HIGH (ref 65–99)

## 2017-06-18 LAB — CULTURE, BLOOD (ROUTINE X 2): CULTURE: NO GROWTH

## 2017-06-18 LAB — BASIC METABOLIC PANEL
ANION GAP: 14 (ref 5–15)
BUN: 16 mg/dL (ref 6–20)
CALCIUM: 7.7 mg/dL — AB (ref 8.9–10.3)
CO2: 24 mmol/L (ref 22–32)
Chloride: 97 mmol/L — ABNORMAL LOW (ref 101–111)
Creatinine, Ser: 4.26 mg/dL — ABNORMAL HIGH (ref 0.61–1.24)
GFR, EST AFRICAN AMERICAN: 18 mL/min — AB (ref >60)
GFR, EST NON AFRICAN AMERICAN: 16 mL/min — AB (ref >60)
GLUCOSE: 116 mg/dL — AB (ref 65–99)
POTASSIUM: 3.5 mmol/L (ref 3.5–5.1)
SODIUM: 135 mmol/L (ref 135–145)

## 2017-06-18 LAB — PREPARE RBC (CROSSMATCH)

## 2017-06-18 LAB — MAGNESIUM: MAGNESIUM: 1.8 mg/dL (ref 1.7–2.4)

## 2017-06-18 MED ORDER — SODIUM CHLORIDE 0.9 % IV SOLN: Freq: Once | INTRAVENOUS | Status: DC

## 2017-06-18 NOTE — Progress Notes (Signed)
Physical Therapy Treatment Patient Details Name: John Bailey MRN: [id number] DOB: [birth year] Today's Date: 06/18/2017    History of Present Illness Pt is a 47 y.o. male with history of RA, PVD, HTN, ESRD on HD, A. fib, DM, CHF and sacral/coccyx pressure ulcer, who was admitted from 04/22/2017-05/13/2017 with anasarca from CHF and A. fib with RVR (cardioverted); was d/c to CIR for debility. He underwent debridement of sacral decubitus ulcer on 06/04/2017 by general surgery.  Pt was supposed to discharge from CIR on 06/09/2017 but general surgery recommended continued inpatient wound care, patient was readmitted on 06/09/2017.   PT Comments    Pt with increased fatigue this session, but remains motivated for OOB transfer. Required total lift from bed to recliner via maximove. Pt requires maxA+2 to roll R/L for pad placement. Limited by BLE pain, requiring a 3rd person to manage BLEs during lift (RN notified). Discharge recs updated for SNF-level therapies.   Follow Up Recommendations  SNF;Supervision for mobility/OOB(SNF v. LTACH)     Equipment Recommendations  (TBD next venue)    Recommendations for Other Services       Precautions / Restrictions Precautions Precautions: Fall Precaution Comments: Severe RA in shoulders, left hand Restrictions Weight Bearing Restrictions: No    Mobility  Bed Mobility Overal bed mobility: Needs Assistance Bed Mobility: Rolling Rolling: Max assist;+2 for physical assistance         General bed mobility comments: Rolled R/L with maxA+2 for placement of maximove pad  Transfers Overall transfer level: Needs assistance               General transfer comment: TotalA for maximove from bed to recliner. +3 (2 for lift, 1 to manage painful BLEs)  Ambulation/Gait                 Stairs            Wheelchair Mobility    Modified Rankin (Stroke Patients Only)       Balance Overall balance assessment: Needs assistance    Sitting balance-Leahy Scale: Poor       Standing balance-Leahy Scale: Zero                              Cognition Arousal/Alertness: Awake/alert Behavior During Therapy: WFL for tasks assessed/performed Overall Cognitive Status: Impaired/Different from baseline Area of Impairment: Attention;Following commands;Safety/judgement;Awareness;Problem solving                   Current Attention Level: Selective   Following Commands: Follows multi-step commands with increased time Safety/Judgement: Decreased awareness of safety;Decreased awareness of deficits Awareness: Emergent Problem Solving: Slow processing;Decreased initiation;Difficulty sequencing;Requires verbal cues;Requires tactile cues General Comments: Continues to have slowed processing      Exercises      General Comments General comments (skin integrity, edema, etc.): VSS      Pertinent Vitals/Pain Pain Assessment: Faces Faces Pain Scale: Hurts whole lot Pain Location: BLEs (R>L), sacral wound with repositioning Pain Descriptors / Indicators: Discomfort;Grimacing Pain Intervention(s): Monitored during session;Repositioned    Home Living                      Prior Function            PT Goals (current goals can now be found in the care plan section) Acute Rehab PT Goals Patient Stated Goal: Get OOB PT Goal Formulation: With patient Time For Goal Achievement: 06/28/17 Potential  to Achieve Goals: Good Progress towards PT goals: Progressing toward goals    Frequency    Min 2X/week      PT Plan Discharge plan needs to be updated;Frequency needs to be updated    Co-evaluation PT/OT/SLP Co-Evaluation/Treatment: Yes Reason for Co-Treatment: Complexity of the patient's impairments (multi-system involvement);For patient/therapist safety;To address functional/ADL transfers PT goals addressed during session: Mobility/safety with mobility;Balance        AM-PAC PT "6 Clicks"  Daily Activity  Outcome Measure  Difficulty turning over in bed (including adjusting bedclothes, sheets and blankets)?: Unable Difficulty moving from lying on back to sitting on the side of the bed? : Unable Difficulty sitting down on and standing up from a chair with arms (e.g., wheelchair, bedside commode, etc,.)?: Unable Help needed moving to and from a bed to chair (including a wheelchair)?: Total Help needed walking in hospital room?: Total Help needed climbing 3-5 steps with a railing? : Total 6 Click Score: 6    End of Session   Activity Tolerance: Patient tolerated treatment well;Patient limited by pain Patient left: in chair;with call bell/phone within reach;with family/visitor present Nurse Communication: Mobility status;Need for lift equipment PT Visit Diagnosis: Other abnormalities of gait and mobility (R26.89);Muscle weakness (generalized) (M62.81)     Time: 8867-7373 PT Time Calculation (min) (ACUTE ONLY): 46 min  Charges:  $Therapeutic Activity: 23-37 mins                    G Codes:      Mabeline Caras, PT, DPT Acute Rehab Services  Pager: [place] 06/18/2017, 4:08 PM

## 2017-06-18 NOTE — Progress Notes (Signed)
[facility] Progress Note  4 Days Post-Op  Subjective: CC-  Continues to have pain at surgical sites. About to start hydrotherapy.  Objective: Vital signs in last 24 hours: Temp:  [97.7 F (36.5 C)-102.9 F (39.4 C)] 97.7 F (36.5 C) (04/05 0851) Pulse Rate:  [83-113] 83 (04/05 0851) Resp:  [15-21] 17 (04/05 0851) BP: (71-125)/(40-74) 125/64 (04/05 0851) SpO2:  [92 %-100 %] 100 % (04/05 0851) Weight:  [280 lb (127 kg)] 280 lb (127 kg) (04/05 0500) Last BM Date: 06/17/17  Intake/Output from previous day: 04/04 0701 - 04/05 0700 In: 830.1 [P.O.:354; IV Piggyback:358.1] Out: 1732  Intake/Output this shift: Total I/O In: 240 [P.O.:240] Out: 0   PE: Gen:  Alert, NAD, pleasant HEENT: EOM's intact, pupils equal and round Pulm:  effort normal GU: Sacral wound:   Right gluteal wound:     Lab Results:  Recent Labs    06/17/17 0351 06/18/17 0317  WBC 18.4* 30.9*  HGB 7.8* 6.6*  HCT 25.4* 20.5*  PLT 521* 452*   BMET Recent Labs    06/17/17 0351 06/18/17 0317  NA 135 135  K 3.6 3.5  CL 96* 97*  CO2 25 24  GLUCOSE 115* 116*  BUN 35* 16  CREATININE 6.25* 4.26*  CALCIUM 7.8* 7.7*   PT/INR No results for input(s): LABPROT, INR in the last 72 hours. CMP     Component Value Date/Time   NA 135 06/18/2017 0317   K 3.5 06/18/2017 0317   CL 97 (L) 06/18/2017 0317   CO2 24 06/18/2017 0317   GLUCOSE 116 (H) 06/18/2017 0317   BUN 16 06/18/2017 0317   CREATININE 4.26 (H) 06/18/2017 0317   CALCIUM 7.7 (L) 06/18/2017 0317   PROT 5.7 (L) 06/15/2017 0240   ALBUMIN 1.7 (L) 06/15/2017 0240   AST 23 06/15/2017 0240   ALT 16 (L) 06/15/2017 0240   ALKPHOS 96 06/15/2017 0240   BILITOT 0.8 06/15/2017 0240   GFRNONAA 16 (L) 06/18/2017 0317   GFRAA 18 (L) 06/18/2017 0317   Lipase  No results found for: LIPASE     Studies/Results: No results found.  Anti-infectives: Anti-infectives (From admission, onward)   Start     Dose/Rate Route  Frequency Ordered Stop   06/17/17 1200  cefTAZidime (FORTAZ) 2 g in sodium chloride 0.9 % 100 mL IVPB     2 g 200 mL/hr over 30 Minutes Intravenous Every T-Th-Sa (Hemodialysis) 06/16/17 1001     06/17/17 0837  vancomycin (VANCOCIN) 1-5 GM/200ML-% IVPB    Note to Pharmacy:  Louretta Shorten   : cabinet override      06/17/17 0837 06/17/17 1123   06/16/17 1100  cefTAZidime (FORTAZ) 2 g in sodium chloride 0.9 % 100 mL IVPB     2 g 200 mL/hr over 30 Minutes Intravenous  Once 06/16/17 1001 06/16/17 1137   06/15/17 1502  vancomycin (VANCOCIN) 1-5 GM/200ML-% IVPB    Note to Pharmacy:  Herriott, Melisa   : cabinet override      06/15/17 1502 06/15/17 1553   06/15/17 1200  vancomycin (VANCOCIN) IVPB 1000 mg/200 mL premix  Status:  Discontinued     1,000 mg 200 mL/hr over 60 Minutes Intravenous Every T-Th-Sa (Hemodialysis) 06/14/17 1256 06/17/17 1153   06/14/17 1330  vancomycin (VANCOCIN) 2,000 mg in sodium chloride 0.9 % 500 mL IVPB     2,000 mg 250 mL/hr over 120 Minutes Intravenous  Once 06/14/17 1256 06/14/17 2229   06/14/17 1130  piperacillin-tazobactam (ZOSYN) IVPB 3.375  g  Status:  Discontinued     3.375 g 12.5 mL/hr over 240 Minutes Intravenous Every 12 hours 06/14/17 1057 06/16/17 0936       Assessment/Plan AF/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 Melanee Spry - surgical path:DENSELY INFLAMED AND NECROTIC FIBROADIPOSE TISSUE,THERE IS NO EVIDENCE OF MALIGNANCY Right gluteal abscess S/p Debridement of 70cm^2 tissue involving subcutaneous tissue, fascia and muscle of right gluteal area, 06/14/17 Dr. Gurney Maxin - surgical path: DENSELY INFLAMED FIBROADIPOSE TISSUE WITH SKELETAL MUSCLE. - culture: MORGANELLA MORGANII - per ID will likely require at least 6 weeks of  antibiotics with dialysis for the sacral osteomyelitis  ID -fortaz 4/3>>, Vancomycin4/1>>;Zosyn 4/1>>4/3; ancef perioperative3/22 FEN -renal diet VTE -eliquis Foley -none  Plan: Continue hydrotherapy to sacral wound and BID wet to dry dressing changes to this area; trace bleeding from right gluteal wound and wound very clean so decrease to once daily wet to dry dressing changes to allow hemostasis. Abx per ID. Will follow up again on Monday.   LOS: 10 days    Wellington Hampshire , [facility] 06/18/2017, 10:57 AM Pager: [phone] Consults: [phone] Mon-Fri 7:00 am-4:30 pm Sat-Sun 7:00 am-11:30 am

## 2017-06-18 NOTE — Progress Notes (Signed)
Occupational Therapy Treatment Patient Details Name: John Bailey MRN: [id number] DOB: 01-24-1971 Today's Date: 06/18/2017    History of present illness Pt is a 47 y.o. male with history of RA, PVD, HTN, ESRD on HD, A. fib, DM, CHF and sacral/coccyx pressure ulcer, who was admitted from 04/22/2017-05/13/2017 with anasarca from CHF and A. fib with RVR (cardioverted); was d/c to CIR for debility. He underwent debridement of sacral decubitus ulcer on 06/04/2017 by general surgery.  Pt was supposed to discharge from CIR on 06/09/2017 but general surgery recommended continued inpatient wound care, patient was readmitted on 06/09/2017.   OT comments  Pt limited by pain this session, increased BLE pain R>L. Remains motivated and willing to push through pain and fatigue for medical benefits of OOB activity.  Maximove used in conjunction with PT (+3 for assist with BLE and pain management)- max A +2 for rolling bed level for pad placement/positioning. OT will continue to follow in the acute setting, dc updated to SNF vs [facility] - pending surgeon recommendation.    Follow Up Recommendations  SNF;LTACH    Equipment Recommendations  Other (comment)(defer to next venue)    Recommendations for Other Services      Precautions / Restrictions Precautions Precautions: Fall Precaution Comments: Severe RA in shoulders, left hand Other Brace/Splint: Prevlon boots used this session Restrictions Weight Bearing Restrictions: No       Mobility Bed Mobility Overal bed mobility: Needs Assistance Bed Mobility: Rolling Rolling: Max assist;+2 for physical assistance         General bed mobility comments: Rolled R/L with maxA+2 for placement of maximove pad  Transfers Overall transfer level: Needs assistance               General transfer comment: TotalA for maximove from bed to recliner. +3 (2 for lift, 1 to manage painful BLEs)    Balance Overall balance assessment: Needs assistance   Sitting  balance-Leahy Scale: Poor       Standing balance-Leahy Scale: Zero                             ADL either performed or assessed with clinical judgement   ADL Overall ADL's : Needs assistance/impaired     Grooming: Wash/dry face;Wash/dry hands;Set up;Bed level Grooming Details (indicate cue type and reason): bed level                 Toilet Transfer: Maximal assistance;+2 for physical assistance;+2 for safety/equipment(rolling in bed)                   Vision       Perception     Praxis      Cognition Arousal/Alertness: Awake/alert Behavior During Therapy: WFL for tasks assessed/performed Overall Cognitive Status: Impaired/Different from baseline Area of Impairment: Attention;Following commands;Safety/judgement;Awareness;Problem solving                   Current Attention Level: Selective   Following Commands: Follows multi-step commands with increased time Safety/Judgement: Decreased awareness of safety;Decreased awareness of deficits Awareness: Emergent Problem Solving: Slow processing;Decreased initiation;Difficulty sequencing;Requires verbal cues;Requires tactile cues General Comments: Continues to have slowed processing        Exercises     Shoulder Instructions       General Comments Pt's BLE more painful today than last session    Pertinent Vitals/ Pain       Pain Assessment: Faces Faces Pain Scale: Hurts  whole lot Pain Location: BLEs (R>L), sacral wound with repositioning Pain Descriptors / Indicators: Discomfort;Grimacing Pain Intervention(s): Monitored during session;Repositioned  Home Living                                          Prior Functioning/Environment              Frequency  Min 2X/week        Progress Toward Goals  OT Goals(current goals can now be found in the care plan section)  Progress towards OT goals: Not progressing toward goals - comment(limited by pain this  session; lift equipment used)  Acute Rehab OT Goals Patient Stated Goal: Get OOB OT Goal Formulation: With patient Time For Goal Achievement: 06/23/17 Potential to Achieve Goals: Good  Plan Discharge plan needs to be updated;Frequency needs to be updated    Co-evaluation    PT/OT/SLP Co-Evaluation/Treatment: Yes Reason for Co-Treatment: Complexity of the patient's impairments (multi-system involvement);For patient/therapist safety;To address functional/ADL transfers PT goals addressed during session: Mobility/safety with mobility OT goals addressed during session: ADL's and self-care      AM-PAC PT "6 Clicks" Daily Activity     Outcome Measure   Help from another person eating meals?: A Lot Help from another person taking care of personal grooming?: A Little Help from another person toileting, which includes using toliet, bedpan, or urinal?: Total Help from another person bathing (including washing, rinsing, drying)?: A Lot Help from another person to put on and taking off regular upper body clothing?: A Lot Help from another person to put on and taking off regular lower body clothing?: Total 6 Click Score: 11    End of Session Equipment Utilized During Treatment: Other (comment)(maximove)  OT Visit Diagnosis: Muscle weakness (generalized) (M62.81);Other abnormalities of gait and mobility (R26.89);Other symptoms and signs involving cognitive function   Activity Tolerance Patient tolerated treatment well;Patient limited by pain   Patient Left in chair;with call bell/phone within reach;with family/visitor present   Nurse Communication Mobility status;Need for lift equipment        Time: 4431-5400 OT Time Calculation (min): 46 min  Charges: OT General Charges $OT Visit: 1 Visit OT Treatments $Therapeutic Activity: 8-22 mins  Hulda Humphrey OTR/L [place] 06/18/2017, 4:41 PM

## 2017-06-18 NOTE — Progress Notes (Addendum)
  [facility] Progress Note    Subjective:   No new complaints.  Required blood transfusion due to drop in Hgb    Objective:   BP 125/64 (BP Location: Right Arm)   Pulse 83   Temp 97.7 F (36.5 C) (Oral)   Resp 17   Ht 6' (1.829 m)   Wt 127 kg (280 lb)   SpO2 100%   BMI 37.97 kg/m   Intake/Output: I/O last 3 completed shifts: In: 1070.1 [P.O.:594; Other:118; IV Piggyback:358.1] Out: 1732 [Other:1732]   Intake/Output this shift:  Total I/O In: 240 [P.O.:240] Out: 0  Weight change: -1.9 kg (-4 lb 3 oz)  Physical Exam: Gen:NAD CVS: no rub Resp: CTA Abd:+BS Ext: trace edema, lavf +t/b small diameter  Labs: BMET Recent Labs  Lab 06/12/17 0358 06/14/17 0752 06/14/17 1644 06/15/17 0240 06/16/17 0259 06/17/17 0351 06/18/17 0317  NA 135 134* 130* 131* 134* 135 135  K 4.4 4.2 4.2 5.1 5.1 3.6 3.5  CL 97* 97* 95* 96* 96* 96* 97*  CO2 24 24 22  21* 22 25 24   GLUCOSE 142* 94 127* 172* 152* 115* 116*  BUN 54* 39* 43* 48* 28* 35* 16  CREATININE 6.19* 6.90* 7.00* 7.53* 4.85* 6.25* 4.26*  ALBUMIN  --  1.8* 1.7* 1.7*  --   --   --   CALCIUM 8.7* 8.2* 8.0* 8.4* 7.9* 7.8* 7.7*   CBC Recent Labs  Lab 06/15/17 0240 06/16/17 0259 06/17/17 0351 06/18/17 0317  WBC 21.7* 21.4* 18.4* 30.9*  NEUTROABS 20.4* 18.9* 15.2* 26.5*  HGB 7.1* 7.4* 7.8* 6.6*  HCT 23.3* 24.0* 25.4* 20.5*  MCV 88.9 86.3 87.3 90.3  PLT 357 409* 521* 452*    @IMGRELPRIORS @ Medications:    . amiodarone  200 mg Oral Daily  . apixaban  5 mg Oral BID  . calcitRIOL  0.5 mcg Oral Q T,Th,Sat-1800  . darbepoetin (ARANESP) injection - DIALYSIS  150 mcg Intravenous Q Thu-HD  . insulin aspart  0-15 Units Subcutaneous TID WC  . insulin aspart  0-5 Units Subcutaneous QHS  . insulin aspart  5 Units Subcutaneous TID WC  . insulin glargine  25 Units Subcutaneous QHS     Assessment/ Plan:   1. AMSdue to gluteal abscess and early sepsis-s/p I&D of abscess with marked improvement in BP and  mentation. He is more arousable today. Appreciate surgery's assistance.  2. ESRDcontinue with HD q TTS while he remains an inpatient.  3. Anemia:on ESA now with drop in hgb post op, s/p transfusion but may need more with HD tomorrow. 4. CKD-MBD:cont with vit D will need binders and to follow phos when able to take po 5. Nutrition:renal diet 6. Hypertension:stable and continue with UF. 7. Vascular access- LAVF placed 05/10/17, RIJ tdc. Question maturation of AVF. Will need to f/u with [facility]. 8. Decubitus ulcers- s/p debridement 06/04/17. Continue with wound care 9. A fib- per primary 10. Rheumatoid arthritis flare- on prednisone taper. 11. Deconditioning- continue with PT/OT   Donetta Potts, MD [place] Pager [phone] 06/18/2017, 11:24 AM

## 2017-06-18 NOTE — Progress Notes (Signed)
Physical Therapy Wound Treatment Patient Details  Name: John Bailey MRN: [id number] Date of Birth: 01/11/1971  Today's Date: 06/18/2017 Time: 1011-1122 Time Calculation (min): 71 min  Subjective  Subjective: pt agrees to get up with PT this afternoon  Patient and Family Stated Goals: heal wounds  Prior Treatments: Surgical I&D 06/04/17 and 06/14/17  Pain Score:  Pt refused premedication. Pt with 10/10 Facial Pain Score during pulsed lavage and packing of wounds. Continue to advise pt on premedication prior to hydrotherapy.  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 after surgery on 4/1 (Active)  Dressing Type ABD;Gauze (Comment);Moist to dry;Barrier Film (skin prep) 06/18/2017 11:00 AM  Dressing Changed 06/18/2017 11:00 AM  Dressing Change Frequency Twice a day 06/18/2017 11:00 AM  State of Healing Early/partial granulation 06/18/2017 11:00 AM  Site / Wound Assessment Pink;Yellow;Painful;Granulation tissue 06/18/2017 11:00 AM  % Wound base Red or Granulating 70% 06/18/2017 11:00 AM  % Wound base Yellow/Fibrinous Exudate 30% 06/18/2017 11:00 AM  Peri-wound Assessment Pink;Intact 06/16/2017  7:40 AM  Wound Length (cm) 8.5 cm 06/17/2017  4:00 PM  Wound Width (cm) 6 cm 06/17/2017  4:00 PM  Wound Depth (cm) 5 cm 06/17/2017  4:00 PM  Wound Surface Area (cm^2) 51 cm^2 06/17/2017  4:00 PM  Wound Volume (cm^3) 255 cm^3 06/17/2017  4:00 PM  Tunneling (cm) 4 cm 3-4 o'clock 06/17/2017  4:00 PM  Undermining (cm) 1 cm 12-3 o'clock  06/17/2017  4:00 PM  Margins Unattached edges (unapproximated) 06/18/2017 11:00 AM  Drainage Amount Minimal 06/18/2017 11:00 AM  Drainage Description Serosanguineous 06/18/2017 11:00 AM  Treatment Hydrotherapy (Pulse lavage);Packing (Saline gauze) 06/18/2017 11:00 AM     Wound / Incision (Open or Dehisced) 06/17/17 Incision - Open Buttocks Right;Lateral R gluteal surgical debridement of abcess  (Active)   Dressing Type ABD;Gauze (Comment);Moist to dry;Barrier Film (skin prep) 06/18/2017 11:00 AM  Dressing Changed Changed 06/18/2017 11:00 AM  Dressing Status New drainage 06/18/2017 11:00 AM  Dressing Change Frequency Daily 06/18/2017 11:00 AM  Site / Wound Assessment Bleeding;Red;Granulation tissue 06/18/2017 11:00 AM  % Wound base Red or Granulating 100% 06/18/2017 11:00 AM  Wound Length (cm) 9 cm 06/17/2017  4:00 PM  Wound Width (cm) 7 cm 06/17/2017  4:00 PM  Wound Depth (cm) 5 cm 06/17/2017  4:00 PM  Wound Volume (cm^3) 315 cm^3 06/17/2017  4:00 PM  Wound Surface Area (cm^2) 63 cm^2 06/17/2017  4:00 PM  Closure None 06/18/2017 11:00 AM  Drainage Amount Copious 06/18/2017 11:00 AM  Drainage Description Serosanguineous;Sanguineous 06/18/2017 11:00 AM  Treatment Packing (Saline gauze) 06/18/2017 11:00 AM      Hydrotherapy Pulsed lavage therapy - wound location: sacrum Pulsed Lavage with Suction (psi): 4 psi(4 psi due to increased pain) Pulsed Lavage with Suction - Normal Saline Used: 1000 mL Pulsed Lavage Tip: Tip with splash shield   Wound Assessment and Plan  Wound Therapy - Assess/Plan/Recommendations Wound Therapy - Clinical Statement: Pt continues to have minimal amount of yellow slough at base of wound that is removed with pulsed lavage, small area of brown necrotic material that is not loose enough for selective debridement. Surgical PA present during session for inspecition of wounds. The gluteal wound started to bleed with unpacking and continued to bleed through session. Gluteal wound packed with saline soaked Kerlix on top of 4x4 PA placed to stop bleed. Pt is incontinent of diarrhea through entire treatment. RN made  aware and instructed to keep dressings clean and replaced quickly if they are soiled.  Factors Delaying/Impairing Wound Healing: Immobility;Multiple medical problems;Diabetes Mellitus Hydrotherapy Plan: Pulsatile lavage with suction;Patient/family education;Dressing change Wound Therapy -  Frequency: 6X / week Wound Therapy - Follow Up Recommendations: Skilled nursing facility Wound Plan: see above  Wound Therapy Goals- Improve the function of patient's integumentary system by progressing the wound(s) through the phases of wound healing (inflammation - proliferation - remodeling) by: Decrease Necrotic Tissue to: 20 Decrease Necrotic Tissue - Progress: Progressing toward goal Increase Granulation Tissue to: 80 Increase Granulation Tissue - Progress: Progressing toward goal Goals/treatment plan/discharge plan were made with and agreed upon by patient/family: Yes Time For Goal Achievement: 7 days Wound Therapy - Potential for Goals: Fair  Goals will be updated until maximal potential achieved or discharge criteria met.  Discharge criteria: when goals achieved, discharge from hospital, MD decision/surgical intervention, no progress towards goals, refusal/missing three consecutive treatments without notification or medical reason.  GP    Dani Gobble. [name] PT, DPT Acute Rehabilitation  [phone] Pager [phone]   [place] 06/18/2017, 12:35 PM

## 2017-06-18 NOTE — Progress Notes (Signed)
[facility]  Date of Admission:  06/08/2017             ASSESSMENT/PLAN  Mr. 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 He did have a fever last night and increased leukocytosis today which is likely reactionary to hydrotherapy as his blood cultures remain negative with no growth in <24 hours. He has also had a drop in his hemoglobin and will be receiving PRBC with no obvious source of bleeding.    1. Discontinue vancomycin. Continue ceftazidime for Morganella osteomyelitis. Tentative completion date set for 07/26/17. 2. Continue wound care per general surgery.  3. We will continue to monitor cultures, CBC and fever curve. If fevers continue may need to consider HD cath removal and line holiday. 4. We will continue to follow.    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 amiodarone  200 mg Oral Daily  . apixaban  5 mg Oral BID  . calcitRIOL  0.5 mcg Oral Q T,Th,Sat-1800  . darbepoetin (ARANESP) injection - DIALYSIS  150 mcg Intravenous Q Thu-HD  . insulin aspart  0-15 Units Subcutaneous TID WC  . insulin aspart  0-5 Units Subcutaneous QHS  . insulin aspart  5 Units Subcutaneous TID WC  . insulin glargine  25 Units Subcutaneous QHS    SUBJECTIVE:  Febrile overnight with a temperature of 102.3 and  increased leukocytosis up to 30.9. Hemoglobin down to 6.6 with orders for transfusion of PRBC. CBC with mild left shift. Continues to receive vancomycin and ceftazidime with no adverse side effects or rashes. Surgical cultures with Morganella Morganii with susceptibility to ceftazidime. Repeat blood cultures from 4/3 with no growth to date <24 hours.   States he is doing okay. Denies chills or night sweats. Believes symptoms related to drowsiness with pain medications.    No Known Allergies   Review of Systems: Review of Systems  Constitutional: Positive for fever. Negative for chills and diaphoresis.  Respiratory: Negative for cough and wheezing.   Cardiovascular: Negative for chest pain.  Gastrointestinal: Negative for abdominal pain, blood in stool, diarrhea, nausea and vomiting.  Skin: Negative for rash.      OBJECTIVE: Vitals:   06/17/17 2105 06/17/17 2232 06/17/17 2300 06/18/17 0500  BP:   93/74   Pulse:   90   Resp:   15   Temp: (!) 101 F (38.3 C) 99.3 F (37.4 C) 99 F (37.2 C)   TempSrc: Oral Oral    SpO2:   100%   Weight:    280 lb (127 kg)  Height:       Body mass index is 37.97 kg/m.  Physical Exam  Constitutional: He is oriented to person, place, and time.  Lying in bed, pleasant  Cardiovascular: Exam reveals no gallop and no friction rub.  No murmur heard. Pulmonary/Chest: Effort normal and breath sounds normal. No respiratory distress. He  has no wheezes. He has no rales. He exhibits no tenderness.  Abdominal: Soft. Bowel sounds are normal. He exhibits no distension.  Neurological: He is alert and oriented to person, place, and time.  Skin: Skin is warm and dry. No rash noted.  Wound dressing is clean, dry and intact. For up to date pictures please see general surgery note.   Psychiatric: Affect and judgment normal.    Lab Results Lab Results  Component Value Date   WBC 30.9 (H) 06/18/2017   HGB 6.6 (LL) 06/18/2017   HCT 20.5 (L) 06/18/2017   MCV  90.3 06/18/2017   PLT 452 (H) 06/18/2017    Lab Results  Component Value Date   CREATININE 4.26 (H) 06/18/2017   BUN 16 06/18/2017   NA 135 06/18/2017   K 3.5 06/18/2017   CL 97 (L) 06/18/2017   CO2 24 06/18/2017    Lab Results  Component Value Date   ALT 16 (L) 06/15/2017   AST 23 06/15/2017   ALKPHOS 96 06/15/2017   BILITOT 0.8 06/15/2017     Microbiology: Recent Results (from the past 240 hour(s))  MRSA PCR Screening     Status: None   Collection Time: 06/08/17  6:37 PM  Result Value Ref Range Status   MRSA by PCR NEGATIVE NEGATIVE Final    Comment:        The GeneXpert MRSA Assay (FDA approved for NASAL specimens only), is one component of a comprehensive MRSA colonization surveillance program. It is not intended to diagnose MRSA infection nor to guide or monitor treatment for MRSA infections. Performed at [facility], [place] [address], [place], [place] [address]   Culture, blood (routine x 2)     Status: None (Preliminary result)   Collection Time: 06/13/17 11:26 AM  Result Value Ref Range Status   Specimen Description BLOOD RIGHT HAND  Final   Special Requests   Final    IN BOTH AEROBIC AND ANAEROBIC BOTTLES Blood Culture adequate volume   Culture   Final    NO GROWTH 4 DAYS Performed at [facility], [place] [address], [place], [place] [address]    Report Status PENDING  Incomplete  Culture, blood (routine x 2)     Status: Abnormal   Collection Time: 06/13/17 11:26 AM  Result Value Ref Range Status   Specimen Description BLOOD RIGHT HAND  Final   Special Requests   Final    IN BOTH AEROBIC AND ANAEROBIC BOTTLES Blood Culture adequate volume   Culture  Setup Time   Final    GRAM POSITIVE COCCI ANAEROBIC BOTTLE ONLY CRITICAL RESULT CALLED TO, READ BACK BY AND VERIFIED WITH: Karlene Einstein PharmD 14:40 06/14/17 ([email or web address])    Culture (A)  Final    STAPHYLOCOCCUS SPECIES (COAGULASE NEGATIVE) THE SIGNIFICANCE OF ISOLATING THIS ORGANISM FROM A SINGLE  SET OF BLOOD CULTURES WHEN MULTIPLE SETS ARE DRAWN IS UNCERTAIN. PLEASE NOTIFY THE MICROBIOLOGY DEPARTMENT WITHIN ONE WEEK IF SPECIATION AND SENSITIVITIES ARE REQUIRED. Performed at [facility], [place] [address], [place], [place] [address]    Report Status 06/16/2017 FINAL  Final  Blood Culture ID Panel (Reflexed)     Status: Abnormal   Collection Time: 06/13/17 11:26 AM  Result Value Ref Range Status   Enterococcus species NOT DETECTED NOT DETECTED Final   Listeria monocytogenes NOT DETECTED NOT DETECTED Final   Staphylococcus species DETECTED (A) NOT DETECTED Final    Comment: Methicillin (oxacillin) resistant coagulase negative staphylococcus. Possible blood  culture contaminant (unless isolated from more than one blood culture draw or clinical case suggests pathogenicity). No antibiotic treatment is indicated for blood  culture contaminants. CRITICAL RESULT CALLED TO, READ BACK BY AND VERIFIED WITH: Karlene Einstein PharmD 14:40 06/14/17 ([email or web address])    Staphylococcus aureus NOT DETECTED NOT DETECTED Final   Methicillin resistance DETECTED (A) NOT DETECTED Final    Comment: CRITICAL RESULT CALLED TO, READ BACK BY AND VERIFIED WITH: Karlene Einstein PharmD 14:40 06/14/17 ([email or web address])    Streptococcus species NOT DETECTED NOT DETECTED Final   Streptococcus agalactiae NOT DETECTED NOT DETECTED Final   Streptococcus pneumoniae NOT DETECTED NOT DETECTED Final   Streptococcus pyogenes NOT DETECTED NOT DETECTED Final   Acinetobacter baumannii NOT DETECTED NOT DETECTED Final   Enterobacteriaceae species NOT DETECTED NOT DETECTED Final   Enterobacter cloacae complex NOT DETECTED NOT DETECTED Final   Escherichia coli NOT DETECTED NOT DETECTED Final   Klebsiella oxytoca NOT DETECTED NOT DETECTED Final   Klebsiella pneumoniae NOT DETECTED NOT DETECTED Final   Proteus species NOT DETECTED NOT DETECTED Final   Serratia marcescens NOT DETECTED NOT DETECTED Final   Haemophilus influenzae NOT DETECTED NOT  DETECTED Final   Neisseria meningitidis NOT DETECTED NOT DETECTED Final   Pseudomonas aeruginosa NOT DETECTED NOT DETECTED Final   Candida albicans NOT DETECTED NOT DETECTED Final   Candida glabrata NOT DETECTED NOT DETECTED Final   Candida krusei NOT DETECTED NOT DETECTED Final   Candida parapsilosis NOT DETECTED NOT DETECTED Final   Candida tropicalis NOT DETECTED NOT DETECTED Final    Comment: Performed at [facility], [place]. [address] [place], [place] [address]  Aerobic/Anaerobic Culture (surgical/deep wound)     Status: None   Collection Time: 06/14/17  7:52 PM  Result Value Ref Range Status   Specimen Description ABSCESS GLUTEAL  Final   Special Requests PATIENT ON FOLLOWING ZOSYN AND VANC  Final   Gram Stain   Final    FEW WBC PRESENT, PREDOMINANTLY PMN FEW GRAM POSITIVE COCCI FEW GRAM NEGATIVE RODS RARE GRAM POSITIVE RODS Performed at [facility], [place] [address] [place], [place] [address]    Culture   Final    FEW MORGANELLA MORGANII MIXED ANAEROBIC FLORA PRESENT.  CALL LAB IF FURTHER IID REQUIRED.    Report Status 06/17/2017 FINAL  Final   Organism ID, Bacteria MORGANELLA MORGANII  Final      Susceptibility   Morganella morganii - MIC*    AMPICILLIN >=32 RESISTANT Resistant     CEFAZOLIN >=64 RESISTANT Resistant     CEFEPIME <=1 SENSITIVE Sensitive     CEFTAZIDIME <=1 SENSITIVE Sensitive     CEFTRIAXONE <=1 SENSITIVE Sensitive     CIPROFLOXACIN <=0.25 SENSITIVE Sensitive     GENTAMICIN <=1 SENSITIVE Sensitive     IMIPENEM 1 SENSITIVE Sensitive     TRIMETH/SULFA <=20 SENSITIVE Sensitive     AMPICILLIN/SULBACTAM 16 INTERMEDIATE Intermediate     PIP/TAZO <=4 SENSITIVE Sensitive     * FEW MORGANELLA MORGANII  MRSA PCR Screening     Status: None   Collection Time: 06/15/17 12:48 AM  Result Value Ref Range Status   MRSA by PCR NEGATIVE NEGATIVE Final    Comment:        The GeneXpert MRSA Assay (FDA approved for NASAL specimens only), is one component  of a comprehensive MRSA colonization surveillance program. It is not intended to diagnose MRSA infection nor to guide or monitor treatment for MRSA infections. Performed at [facility], [address], [place], [place] [address]   Culture, blood (routine x 2)     Status: None (Preliminary result)   Collection Time: 06/16/17 10:01 AM  Result Value Ref Range Status   Specimen Description BLOOD RIGHT ANTECUBITAL  Final   Special Requests   Final    BOTTLES DRAWN AEROBIC AND ANAEROBIC Blood Culture results may not be optimal due to an excessive volume of blood received in culture bottles   Culture   Final    NO GROWTH < 24 HOURS Performed at [facility] [address], [place], [place] [address]    Report Status PENDING  Incomplete  Culture, blood (routine x 2)     Status: None (Preliminary result)   Collection Time: 06/16/17 10:01 AM  Result Value Ref Range Status   Specimen Description BLOOD RIGHT HAND  Final   Special Requests   Final    BOTTLES DRAWN AEROBIC AND ANAEROBIC Blood Culture results may not be optimal due to an inadequate volume of blood received in culture bottles   Culture   Final    NO GROWTH < 24 HOURS Performed at [facility], [place] [address], [place], [place] [address]    Report Status PENDING  Incomplete     Terri Piedra, [place] [facility] [place] Pager  06/18/2017  8:37 AM

## 2017-06-18 NOTE — Progress Notes (Signed)
Physical Therapy Treatment Patient Details Name: John Bailey MRN: [id number] DOB: [birth year] Today's Date: 06/18/2017    History of Present Illness Pt is a 47 y.o. male with history of RA, PVD, HTN, ESRD on HD, A. fib, DM, CHF and sacral/coccyx pressure ulcer, who was admitted from 04/22/2017-05/13/2017 with anasarca from CHF and A. fib with RVR (cardioverted); was d/c to CIR for debility. He underwent debridement of sacral decubitus ulcer on 06/04/2017 by general surgery.  Pt was supposed to discharge from CIR on 06/09/2017 but general surgery recommended continued inpatient wound care, patient was readmitted on 06/09/2017.   PT Comments    Pt seen to perform transfer back to bed with use of maximove mechanical lift. Pt was able to tolerate sitting in chair ~62 minutes for pressure relief. Tolerated second session better, although remains limited by fatigue and BLE pain. Applied prevalon boots due to bilat heel cord tightness; educ pt and wife on technique for stretching. Will follow acutely.   Follow Up Recommendations  SNF;LTACH     Equipment Recommendations  (TBD next venue)    Recommendations for Other Services       Precautions / Restrictions Precautions Precautions: Fall Precaution Comments: Severe RA in shoulders, left hand Other Brace/Splint: Prevlon boots used this session Restrictions Weight Bearing Restrictions: No    Mobility  Bed Mobility Overal bed mobility: Needs Assistance Bed Mobility: Rolling Rolling: Max assist;+2 for physical assistance         General bed mobility comments: Rolled R/L with maxA+2 for removal of maximove pad  Transfers Overall transfer level: Needs assistance               General transfer comment: TotalA for maximove from recliner to bed. +3 (2 for lift, 1 to manage painful BLEs)  Ambulation/Gait                 Stairs            Wheelchair Mobility    Modified Rankin (Stroke Patients Only)       Balance  Overall balance assessment: Needs assistance   Sitting balance-Leahy Scale: Poor       Standing balance-Leahy Scale: Zero                              Cognition Arousal/Alertness: Awake/alert Behavior During Therapy: WFL for tasks assessed/performed Overall Cognitive Status: Impaired/Different from baseline Area of Impairment: Attention;Following commands;Safety/judgement;Awareness;Problem solving                   Current Attention Level: Selective   Following Commands: Follows multi-step commands with increased time Safety/Judgement: Decreased awareness of safety;Decreased awareness of deficits Awareness: Emergent Problem Solving: Slow processing;Decreased initiation;Difficulty sequencing;Requires verbal cues;Requires tactile cues General Comments: Continues to have slowed processing      Exercises Other Exercises Other Exercises: Passive stretch to bilat gastroc/heel cord; educ pt on using sheet for AAROM; educ wife on passive technique    General Comments General comments (skin integrity, edema, etc.): Pt's BLE more painful today than last session      Pertinent Vitals/Pain Pain Assessment: Faces Faces Pain Scale: Hurts whole lot Pain Location: BLEs (R>L), sacral wound with repositioning Pain Descriptors / Indicators: Discomfort;Grimacing Pain Intervention(s): Monitored during session;Limited activity within patient's tolerance;Repositioned    Home Living                      Prior Function  PT Goals (current goals can now be found in the care plan section) Acute Rehab PT Goals Patient Stated Goal: Feel better PT Goal Formulation: With patient Time For Goal Achievement: 06/28/17 Potential to Achieve Goals: Good Progress towards PT goals: Progressing toward goals    Frequency    Min 2X/week      PT Plan Current plan remains appropriate    Co-evaluation PT/OT/SLP Co-Evaluation/Treatment: Yes Reason for  Co-Treatment: Complexity of the patient's impairments (multi-system involvement) PT goals addressed during session: Mobility/safety with mobility OT goals addressed during session: ADL's and self-care      AM-PAC PT "6 Clicks" Daily Activity  Outcome Measure  Difficulty turning over in bed (including adjusting bedclothes, sheets and blankets)?: Unable Difficulty moving from lying on back to sitting on the side of the bed? : Unable Difficulty sitting down on and standing up from a chair with arms (e.g., wheelchair, bedside commode, etc,.)?: Unable Help needed moving to and from a bed to chair (including a wheelchair)?: Total Help needed walking in hospital room?: Total Help needed climbing 3-5 steps with a railing? : Total 6 Click Score: 6    End of Session   Activity Tolerance: Patient limited by pain;Patient limited by fatigue Patient left: in bed;with call bell/phone within reach;with family/visitor present Nurse Communication: Mobility status;Need for lift equipment PT Visit Diagnosis: Other abnormalities of gait and mobility (R26.89);Muscle weakness (generalized) (M62.81)     Time: 0938-1829 PT Time Calculation (min) (ACUTE ONLY): 24 min  Charges:  $Therapeutic Activity: 8-22 mins                    G Codes:      Mabeline Caras, PT, DPT Acute Rehab Services  Pager: [place] 06/18/2017, 4:57 PM

## 2017-06-18 NOTE — Progress Notes (Signed)
Patient ID: John Bailey, male   DOB: 1970/06/29, 47 y.o.   MRN: [id number]  PROGRESS NOTE    John Bailey  WVP:[id number] DOB: May 30, 1970 DOA: 06/08/2017 PCP: [name], MD   Brief Narrative:  47 year old male with history of RA, PVD, hypertension, end-stage renal disease on hemodialysis, A. fib on Eliquis, diabetes and chronic diastolic CHF and sacral and coccyx pressure ulcer who was admitted from 04/22/2017-05/13/2017 with anasarca from acute on chronic diastolic heart failure and A. fib with RVR (cardioverted) and was discharged to inpatient rehab for debility.  He underwent debridement of sacral decubitus ulcer on 06/04/2017 by general surgery.  He was supposed to be discharged from [facility] on 06/09/2017 but general surgery recommended continued inpatient wound care so patient was readmitted to [facility] on 06/09/2017.  He required debridement of the right gluteal abscess on 06/14/2017 by general surgery.  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  One set of blood culture is growing coagulase-negative staph, probably a contaminant.  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  Acute metabolic encephalopathy -Probably secondary to above.  Mental status changes may have resolved.  CT of the head was negative for acute abnormality  Unstageable Sacral ulcer status post debridement -Debridement was done by Dr. Grandville Silos on 06/04/2017.   -General surgery following.  -Hydrotherapy will be resumed from today as per surgery recommendations.  Dressing changes as per general surgery. -Antibiotic management as indicated above.  ESRD on HD  -Nephrology following.  Dialysis as per nephrology schedule.  Underwent HD on 4/4.  I discussed with nephrology and plans for HD again 4/6 and consider transfusion across HD if needed.  Hypertension -Blood pressure on the lower side.  Coreg on hold.  Monitor.  Asymptomatic.  Type II Diabetes  -Continue current dose of Levemir along with NovoLog with meals.  Continue Accu-Cheks with coverage.  Reasonable inpatient control.  Leukocytosis, likely reactive, related to underlying sacral wound and steroids in the setting of RA   -Leukocytosis has worsened from 18-30, likely due to hydrotherapy.  Antibiotics as above.  Follow CBC across HD on 4/6.  Anemia of chronic disease  -Hemoglobin dropped to 6.6 on 4/5.  Difficult stick to get type and screen done.  Transfuse 1 unit PRBC today if possible.  If unable to, since patient asymptomatic, could transfuse across dialysis in a.m.  Atrial Fibrillation CHA2DS2-VASc score 2-3 , on anticoagulation with Eliquis. Echo 04/25/17  EF 54-62, normal systolic  -Status post recent cardioversion during last hospitalization.  Currently rate controlled Continue  amiodarone.  Hold Coreg.  Eliquis resumed.  Chronic diastolic heart failure.   -Recent hospitalization for acute decompensated  heart failure. -Currently compensated and volume is managed by dialysis.  Hold Coreg.  Outpatient follow-up with cardiology  Rheumatoid arthritis with flare Continue 6-week prednisone taper which was started on 05/19/2017. outpatient follow-up with rheumatology.  Patient had been receiving abatacept every Tuesday prior to admission; will hold it for now.  Deconditioning  -Continue PT/OT  Morbid obesity -Outpatient follow-up  DVT prophylaxis:  Eliquis Code Status:    Full  Family Communication:   Spoke to wife at bedside, updated care and answered questions. Disposition Plan: SNF vs LTAC once clinically improves  Consultants: General surgery/nephrology/ID  Procedures: None  Antimicrobials: Zosyn from 06/14/2017-06/15/17 Vancomycin from 06/14/2017 onwards-discontinued 4/5. Ceftazidime from 06/16/2017 onwards   Subjective: Patient interviewed and examined along with his spouse and nurse at bedside.  Denies complaints.  Denies pain.  No chest pain, dyspnea, cough reported.  As per RN, some bleeding at sacral wound site but not a whole lot.  Objective: Vitals:   06/18/17 1127 06/18/17 1152 06/18/17 1208 06/18/17 1459  BP: (!) 95/46 111/64 (!) 100/54 (!) 93/57  Pulse: 88 100    Resp: 18 16 18 16   Temp: 98.2 F (36.8 C) 98 F (36.7 C) 98.2 F (36.8 C) 98 F (36.7 C)  TempSrc: Oral Oral Oral Oral  SpO2: 98% 95%    Weight:      Height:        Intake/Output Summary (Last 24 hours) at 06/18/2017 1539 Last data filed at 06/18/2017 1449 Gross per 24 hour  Intake 1403.08 ml  Output 2 ml  Net 1401.08 ml   Filed Weights   06/17/17 0644 06/17/17 1057 06/18/17 0500  Weight: (!) 138 kg (304 lb 3.8 oz) (!) 136.1 kg (300 lb 0.7 oz) 127 kg (280 lb)    Examination:  General exam: Pleasant young male, moderately built and nourished lying comfortably propped up in bed. respiratory system: Bilateral decreased breath sounds at bases but otherwise clear to auscultation.  No increased work of  breathing. cardiovascular system: S1 and S2 heard, RRR.  No JVD, murmurs or pedal edema. gastrointestinal system: Abdomen is obese, nondistended, soft and nontender. Normal bowel sounds heard. Extremities: Moves all limbs symmetrically. CNS: Alert and oriented.  No focal neurological deficits.  Data Reviewed: I have personally reviewed following labs and imaging studies  CBC: Recent Labs  Lab 06/14/17 1644 06/15/17 0240 06/16/17 0259 06/17/17 0351 06/18/17 0317  WBC 19.7* 21.7* 21.4* 18.4* 30.9*  NEUTROABS 16.7* 20.4* 18.9* 15.2* 26.5*  HGB 8.1* 7.1* 7.4* 7.8* 6.6*  HCT 25.6* 23.3* 24.0* 25.4* 20.5*  MCV 89.2 88.9 86.3 87.3 90.3  PLT 330 357 409* 521* 893*   Basic Metabolic Panel: Recent Labs  Lab 06/14/17 0752 06/14/17 1644 06/15/17 0240 06/16/17 0259 06/17/17 0351 06/18/17 0317  NA 134* 130* 131* 134* 135 135  K 4.2 4.2 5.1 5.1 3.6 3.5  CL 97* 95* 96* 96* 96* 97*  CO2 24 22 21* 22 25 24   GLUCOSE 94 127* 172* 152* 115* 116*  BUN 39* 43* 48* 28* 35* 16  CREATININE 6.90* 7.00* 7.53* 4.85* 6.25* 4.26*  CALCIUM 8.2* 8.0* 8.4* 7.9* 7.8* 7.7*  MG 1.6*  --  1.6* 1.9 1.9 1.8   GFR: Estimated Creatinine Clearance: 30.5 mL/min (A) (by C-G formula based on SCr of 4.26 mg/dL (H)). Liver Function Tests: Recent Labs  Lab 06/14/17 0752 06/14/17 1644 06/15/17 0240  AST 17 33 23  ALT 10* 14* 16*  ALKPHOS 83 85 96  BILITOT 0.7 1.1 0.8  PROT 5.9* 5.4* 5.7*  ALBUMIN 1.8* 1.7* 1.7*    Recent Labs  Lab 06/14/17 1644  AMMONIA 23   CBG: Recent Labs  Lab 06/17/17 1217 06/17/17 1619 06/17/17 2230 06/18/17 0848 06/18/17 1222  GLUCAP 73 110* 125* 139* 147*   Sepsis Labs: Recent Labs  Lab 06/14/17 1644  LATICACIDVEN 1.8    Recent Results (from the past 240 hour(s))  MRSA PCR Screening     Status: None   Collection Time: 06/08/17  6:37 PM  Result Value Ref Range Status   MRSA by PCR NEGATIVE NEGATIVE Final    Comment:        The GeneXpert MRSA Assay  (FDA approved for NASAL specimens only), is one component of a comprehensive MRSA colonization surveillance program. It is not intended to diagnose MRSA infection nor to guide or monitor treatment for MRSA infections. Performed at [facility], [place] [address] [place], [place] [address]   Culture, blood (routine x 2)     Status: None   Collection Time: 06/13/17 11:26 AM  Result Value Ref Range Status   Specimen Description BLOOD RIGHT HAND  Final   Special Requests   Final    IN BOTH AEROBIC AND ANAEROBIC BOTTLES Blood Culture adequate volume   Culture   Final    NO GROWTH 5 DAYS Performed at [facility], [place] [address] [place], [place] [address]    Report Status 06/18/2017 FINAL  Final  Culture, blood (routine x 2)     Status: Abnormal   Collection Time: 06/13/17 11:26 AM  Result Value Ref Range Status   Specimen Description BLOOD RIGHT HAND  Final   Special Requests   Final    IN BOTH AEROBIC AND ANAEROBIC BOTTLES Blood Culture adequate volume   Culture  Setup Time   Final    GRAM POSITIVE COCCI ANAEROBIC BOTTLE ONLY CRITICAL RESULT CALLED TO, READ BACK BY AND VERIFIED WITH: Karlene Einstein PharmD 14:40 06/14/17 ([email or web address])    Culture (A)  Final    STAPHYLOCOCCUS SPECIES (COAGULASE NEGATIVE) THE SIGNIFICANCE OF ISOLATING THIS ORGANISM FROM A SINGLE SET OF BLOOD CULTURES WHEN MULTIPLE SETS ARE DRAWN IS UNCERTAIN. PLEASE NOTIFY THE MICROBIOLOGY DEPARTMENT WITHIN ONE WEEK IF SPECIATION AND SENSITIVITIES ARE REQUIRED. Performed at [facility], [place] [address], [place], [place] [address]    Report Status 06/16/2017 FINAL  Final  Blood Culture ID Panel (Reflexed)     Status: Abnormal   Collection Time: 06/13/17 11:26 AM  Result Value Ref Range Status   Enterococcus species NOT DETECTED NOT DETECTED Final   Listeria monocytogenes NOT DETECTED NOT DETECTED Final   Staphylococcus species DETECTED (A) NOT DETECTED Final    Comment: Methicillin (oxacillin) resistant  coagulase negative staphylococcus. Possible blood culture contaminant (unless isolated from more than one blood culture draw or clinical case suggests pathogenicity). No antibiotic treatment is indicated for blood  culture contaminants. CRITICAL RESULT CALLED TO, READ BACK BY AND VERIFIED WITH: Karlene Einstein PharmD 14:40 06/14/17 ([email or web address])    Staphylococcus aureus NOT DETECTED NOT DETECTED Final   Methicillin resistance DETECTED (A) NOT DETECTED Final    Comment: CRITICAL RESULT CALLED TO, READ BACK BY AND VERIFIED WITH: Karlene Einstein PharmD 14:40 06/14/17 ([email or web address])    Streptococcus species NOT DETECTED NOT DETECTED Final   Streptococcus agalactiae NOT DETECTED NOT DETECTED Final   Streptococcus pneumoniae NOT  DETECTED NOT DETECTED Final   Streptococcus pyogenes NOT DETECTED NOT DETECTED Final   Acinetobacter baumannii NOT DETECTED NOT DETECTED Final   Enterobacteriaceae species NOT DETECTED NOT DETECTED Final   Enterobacter cloacae complex NOT DETECTED NOT DETECTED Final   Escherichia coli NOT DETECTED NOT DETECTED Final   Klebsiella oxytoca NOT DETECTED NOT DETECTED Final   Klebsiella pneumoniae NOT DETECTED NOT DETECTED Final   Proteus species NOT DETECTED NOT DETECTED Final   Serratia marcescens NOT DETECTED NOT DETECTED Final   Haemophilus influenzae NOT DETECTED NOT DETECTED Final   Neisseria meningitidis NOT DETECTED NOT DETECTED Final   Pseudomonas aeruginosa NOT DETECTED NOT DETECTED Final   Candida albicans NOT DETECTED NOT DETECTED Final   Candida glabrata NOT DETECTED NOT DETECTED Final   Candida krusei NOT DETECTED NOT DETECTED Final   Candida parapsilosis NOT DETECTED NOT DETECTED Final   Candida tropicalis NOT DETECTED NOT DETECTED Final    Comment: Performed at [facility], [place] [address], [place], [place] [address]  Aerobic/Anaerobic Culture (surgical/deep wound)     Status: None   Collection Time: 06/14/17  7:52 PM  Result Value Ref Range Status   Specimen  Description ABSCESS GLUTEAL  Final   Special Requests PATIENT ON FOLLOWING ZOSYN AND VANC  Final   Gram Stain   Final    FEW WBC PRESENT, PREDOMINANTLY PMN FEW GRAM POSITIVE COCCI FEW GRAM NEGATIVE RODS RARE GRAM POSITIVE RODS Performed at [facility], [place] [address] [place], [place] [address]    Culture   Final    FEW MORGANELLA MORGANII MIXED ANAEROBIC FLORA PRESENT.  CALL LAB IF FURTHER IID REQUIRED.    Report Status 06/17/2017 FINAL  Final   Organism ID, Bacteria MORGANELLA MORGANII  Final      Susceptibility   Morganella morganii - MIC*    AMPICILLIN >=32 RESISTANT Resistant     CEFAZOLIN >=64 RESISTANT Resistant     CEFEPIME <=1 SENSITIVE Sensitive     CEFTAZIDIME <=1 SENSITIVE Sensitive     CEFTRIAXONE <=1 SENSITIVE Sensitive     CIPROFLOXACIN <=0.25 SENSITIVE Sensitive     GENTAMICIN <=1 SENSITIVE Sensitive     IMIPENEM 1 SENSITIVE Sensitive     TRIMETH/SULFA <=20 SENSITIVE Sensitive     AMPICILLIN/SULBACTAM 16 INTERMEDIATE Intermediate     PIP/TAZO <=4 SENSITIVE Sensitive     * FEW MORGANELLA MORGANII  MRSA PCR Screening     Status: None   Collection Time: 06/15/17 12:48 AM  Result Value Ref Range Status   MRSA by PCR NEGATIVE NEGATIVE Final    Comment:        The GeneXpert MRSA Assay (FDA approved for NASAL specimens only), is one component of a comprehensive MRSA colonization surveillance program. It is not intended to diagnose MRSA infection nor to guide or monitor treatment for MRSA infections. Performed at [facility], [place] [address] [place], [place] [address]   Culture, blood (routine x 2)     Status: None (Preliminary result)   Collection Time: 06/16/17 10:01 AM  Result Value Ref Range Status   Specimen Description BLOOD RIGHT ANTECUBITAL  Final   Special Requests   Final    BOTTLES DRAWN AEROBIC AND ANAEROBIC Blood Culture results may not be optimal due to an excessive volume of blood received in culture bottles   Culture   Final     NO GROWTH 2 DAYS Performed at [place] [address], [place],  [address]  Report Status PENDING  Incomplete  Culture, blood (routine x 2)     Status: None (Preliminary result)   Collection Time: 06/16/17 10:01 AM  Result Value Ref Range Status   Specimen Description BLOOD RIGHT HAND  Final   Special Requests   Final    BOTTLES DRAWN AEROBIC AND ANAEROBIC Blood Culture results may not be optimal due to an inadequate volume of blood received in culture bottles   Culture   Final    NO GROWTH 2 DAYS Performed at [facility], [place] [address] [place], [place] [address]    Report Status PENDING  Incomplete         Radiology Studies: No results found.      Scheduled Meds: . amiodarone  200 mg Oral Daily  . apixaban  5 mg Oral BID  . calcitRIOL  0.5 mcg Oral Q T,Th,Sat-1800  . darbepoetin (ARANESP) injection - DIALYSIS  150 mcg Intravenous Q Thu-HD  . insulin aspart  0-15 Units Subcutaneous TID WC  . insulin aspart  0-5 Units Subcutaneous QHS  . insulin aspart  5 Units Subcutaneous TID WC  . insulin glargine  25 Units Subcutaneous QHS   Continuous Infusions: . sodium chloride    . sodium chloride    . cefTAZidime (FORTAZ)  IV Stopped (06/17/17 1211)     LOS: 10 days        Vernell Leep, MD, FACP, [facility]. [facility] Pager [phone]  If 7PM-7AM, please contact night-coverage [email or web address] Password [facility] 06/18/2017, 3:49 PM

## 2017-06-18 NOTE — Progress Notes (Signed)
Occupational Therapy Treatment Patient Details Name: LYRICK LAGRAND MRN: [id number] DOB: 1970-11-09 Today's Date: 06/18/2017    History of present illness Pt is a 47 y.o. male with history of RA, PVD, HTN, ESRD on HD, A. fib, DM, CHF and sacral/coccyx pressure ulcer, who was admitted from 04/22/2017-05/13/2017 with anasarca from CHF and A. fib with RVR (cardioverted); was d/c to CIR for debility. He underwent debridement of sacral decubitus ulcer on 06/04/2017 by general surgery.  Pt was supposed to discharge from CIR on 06/09/2017 but general surgery recommended continued inpatient wound care, patient was readmitted on 06/09/2017.   OT comments  Pt seen in conjunction with PT and SPT to perform transfer back to bed with use of maximove mechanical lift. Pt was able to tolerate sitting in chair ~62 minutes for pressure relief and additional health benefits. Tolerated second session and transfer better, although remains limited by fatigue and BLE pain. Applied prevalon boots due to bilat heel cord tightness; educ pt and wife on technique for stretching. Will continue follow acutely and monitor for SNF vs LTACH progression.  Follow Up Recommendations  SNF;LTACH    Equipment Recommendations  Other (comment)(defer to next venue)    Recommendations for Other Services      Precautions / Restrictions Precautions Precautions: Fall Precaution Comments: Severe RA in shoulders, left hand Required Braces or Orthoses: Other Brace/Splint Other Brace/Splint: Prevlon boots used this session Restrictions Weight Bearing Restrictions: No       Mobility Bed Mobility Overal bed mobility: Needs Assistance Bed Mobility: Rolling Rolling: Max assist;+2 for physical assistance         General bed mobility comments: Rolled R/L with maxA+2 for removal of maximove pad  Transfers Overall transfer level: Needs assistance               General transfer comment: TotalA for maximove from recliner to bed. +3  (2 for lift, 1 to manage painful BLEs)    Balance Overall balance assessment: Needs assistance   Sitting balance-Leahy Scale: Poor       Standing balance-Leahy Scale: Zero                             ADL either performed or assessed with clinical judgement   ADL Overall ADL's : Needs assistance/impaired     Grooming: Wash/dry face;Wash/dry hands;Set up;Bed level Grooming Details (indicate cue type and reason): bed level                 Toilet Transfer: Maximal assistance;+2 for physical assistance;+2 for safety/equipment Toilet Transfer Details (indicate cue type and reason): rolling in bed                 Vision       Perception     Praxis      Cognition Arousal/Alertness: Awake/alert Behavior During Therapy: WFL for tasks assessed/performed Overall Cognitive Status: Impaired/Different from baseline Area of Impairment: Attention;Following commands;Safety/judgement;Awareness;Problem solving                   Current Attention Level: Selective   Following Commands: Follows multi-step commands with increased time Safety/Judgement: Decreased awareness of safety;Decreased awareness of deficits Awareness: Emergent Problem Solving: Slow processing;Decreased initiation;Difficulty sequencing;Requires verbal cues;Requires tactile cues General Comments: Continues to have slowed processing        Exercises Other Exercises Other Exercises: Passive stretch to bilat gastroc/heel cord; educ pt on using sheet for AAROM; educ wife on passive  technique   Shoulder Instructions       General Comments Pt's BLE more painful today than last session    Pertinent Vitals/ Pain       Pain Assessment: Faces Faces Pain Scale: Hurts whole lot Pain Location: BLEs (R>L), sacral wound with repositioning Pain Descriptors / Indicators: Discomfort;Grimacing Pain Intervention(s): Monitored during session;Limited activity within patient's  tolerance;Repositioned  Home Living                                          Prior Functioning/Environment              Frequency  Min 2X/week        Progress Toward Goals  OT Goals(current goals can now be found in the care plan section)  Progress towards OT goals: Not progressing toward goals - comment(limited by pain and fatigue but continues to be motivated)  Acute Rehab OT Goals Patient Stated Goal: Feel better OT Goal Formulation: With patient Time For Goal Achievement: 06/23/17 Potential to Achieve Goals: Good  Plan Discharge plan remains appropriate;Frequency remains appropriate    Co-evaluation    PT/OT/SLP Co-Evaluation/Treatment: Yes Reason for Co-Treatment: Complexity of the patient's impairments (multi-system involvement);For patient/therapist safety;To address functional/ADL transfers PT goals addressed during session: Mobility/safety with mobility OT goals addressed during session: ADL's and self-care      AM-PAC PT "6 Clicks" Daily Activity     Outcome Measure   Help from another person eating meals?: A Lot Help from another person taking care of personal grooming?: A Little Help from another person toileting, which includes using toliet, bedpan, or urinal?: Total Help from another person bathing (including washing, rinsing, drying)?: A Lot Help from another person to put on and taking off regular upper body clothing?: A Lot Help from another person to put on and taking off regular lower body clothing?: Total 6 Click Score: 11    End of Session Equipment Utilized During Treatment: Other (comment)(maximove)  OT Visit Diagnosis: Muscle weakness (generalized) (M62.81);Other abnormalities of gait and mobility (R26.89);Other symptoms and signs involving cognitive function   Activity Tolerance Patient tolerated treatment well;Patient limited by pain   Patient Left in bed;with call bell/phone within reach;with family/visitor  present   Nurse Communication Mobility status;Need for lift equipment        Time: 7353-2992 OT Time Calculation (min): 24 min  Charges: OT General Charges $OT Visit: 1 Visit OT Treatments $Therapeutic Activity: 8-22 mins  Hulda Humphrey OTR/L [place] 06/18/2017, 5:28 PM

## 2017-06-18 NOTE — Care Management (Signed)
06/18/17:  CM discussed LTACH referral with surgery PA - service would like to revisit potential referral Monday - per last assessment hydrotherapy may discontinue early next week  06/17/17 Update:  CM again text paged surgery PA  Pt discussed in LOS 06/17/17 - pt remains appropriate for continued stay.    LTACH referral given - both agencies given referral - only [facility] offered bed.  CM discussed LTACH referral with attending - attending deferred decision to surgery.  CM text paged surgery group during 10 oclock hour

## 2017-06-19 DIAGNOSIS — N186 End stage renal disease: Secondary | ICD-10-CM

## 2017-06-19 DIAGNOSIS — D62 Acute posthemorrhagic anemia: Secondary | ICD-10-CM

## 2017-06-19 LAB — CBC
HEMATOCRIT: 23.5 % — AB (ref 39.0–52.0)
HEMOGLOBIN: 7.3 g/dL — AB (ref 13.0–17.0)
MCH: 26.6 pg (ref 26.0–34.0)
MCHC: 31.1 g/dL (ref 30.0–36.0)
MCV: 85.8 fL (ref 78.0–100.0)
Platelets: 427 10*3/uL — ABNORMAL HIGH (ref 150–400)
RBC: 2.74 MIL/uL — ABNORMAL LOW (ref 4.22–5.81)
RDW: 19.8 % — ABNORMAL HIGH (ref 11.5–15.5)
WBC: 31.5 10*3/uL — AB (ref 4.0–10.5)

## 2017-06-19 LAB — BASIC METABOLIC PANEL
Anion gap: 16 — ABNORMAL HIGH (ref 5–15)
BUN: 24 mg/dL — AB (ref 6–20)
CO2: 22 mmol/L (ref 22–32)
CREATININE: 5.51 mg/dL — AB (ref 0.61–1.24)
Calcium: 8.1 mg/dL — ABNORMAL LOW (ref 8.9–10.3)
Chloride: 97 mmol/L — ABNORMAL LOW (ref 101–111)
GFR calc Af Amer: 13 mL/min — ABNORMAL LOW (ref >60)
GFR, EST NON AFRICAN AMERICAN: 11 mL/min — AB (ref >60)
GLUCOSE: 109 mg/dL — AB (ref 65–99)
Potassium: 4.2 mmol/L (ref 3.5–5.1)
SODIUM: 135 mmol/L (ref 135–145)

## 2017-06-19 LAB — CBC WITH DIFFERENTIAL/PLATELET
BAND NEUTROPHILS: 0 %
BASOS ABS: 0 10*3/uL (ref 0.0–0.1)
BLASTS: 0 %
Basophils Relative: 0 %
Eosinophils Absolute: 0 10*3/uL (ref 0.0–0.7)
Eosinophils Relative: 0 %
HEMATOCRIT: 22 % — AB (ref 39.0–52.0)
Hemoglobin: 7.1 g/dL — ABNORMAL LOW (ref 13.0–17.0)
Lymphocytes Relative: 4 %
Lymphs Abs: 1.2 10*3/uL (ref 0.7–4.0)
MCH: 28.6 pg (ref 26.0–34.0)
MCHC: 32.3 g/dL (ref 30.0–36.0)
MCV: 88.7 fL (ref 78.0–100.0)
METAMYELOCYTES PCT: 0 %
MONO ABS: 1.2 10*3/uL — AB (ref 0.1–1.0)
MYELOCYTES: 2 %
Monocytes Relative: 4 %
Neutro Abs: 28.5 10*3/uL — ABNORMAL HIGH (ref 1.7–7.7)
Neutrophils Relative %: 90 %
PLATELETS: 460 10*3/uL — AB (ref 150–400)
Promyelocytes Relative: 0 %
RBC: 2.48 MIL/uL — AB (ref 4.22–5.81)
RDW: 21.1 % — AB (ref 11.5–15.5)
WBC: 30.9 10*3/uL — AB (ref 4.0–10.5)
nRBC: 0 /100 WBC

## 2017-06-19 LAB — GLUCOSE, CAPILLARY
GLUCOSE-CAPILLARY: 99 mg/dL (ref 65–99)
GLUCOSE-CAPILLARY: 114 mg/dL — AB (ref 65–99)
GLUCOSE-CAPILLARY: 139 mg/dL — AB (ref 65–99)

## 2017-06-19 LAB — PREPARE RBC (CROSSMATCH)

## 2017-06-19 LAB — MAGNESIUM: Magnesium: 1.9 mg/dL (ref 1.7–2.4)

## 2017-06-19 MED ORDER — SODIUM CHLORIDE 0.9% FLUSH
3.0000 mL | Freq: Two times a day (BID) | INTRAVENOUS | Status: DC
  Administered 2017-06-19 – 2017-07-04 (×28): 3 mL via INTRAVENOUS

## 2017-06-19 MED ORDER — PREDNISONE 20 MG PO TABS
30.0000 mg | ORAL_TABLET | Freq: Every day | ORAL | Status: DC
  Administered 2017-06-19 – 2017-06-24 (×6): 30 mg via ORAL
  Filled 2017-06-19 (×6): qty 1

## 2017-06-19 MED ORDER — SODIUM CHLORIDE 0.9 % IV SOLN: Freq: Once | INTRAVENOUS | Status: DC

## 2017-06-19 NOTE — Progress Notes (Signed)
Physical Therapy Wound Treatment Patient Details  Name: John Bailey MRN: [id number] Date of Birth: May 03, 1970  Today's Date: 06/19/2017 Time: 5852-7782 Time Calculation (min): 90 min  Subjective  Subjective: Pt in increased pain today believes he is having RA flare Patient and Family Stated Goals: heal wounds  Prior Treatments: Surgical I&D 06/04/17 and 06/14/17  Pain Score: Pt with 10/10 pain with movement due to RA flare. MD notified.  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 after surgery on 4/1 (Active)  Dressing Type ABD;Gauze (Comment);Moist to dry;Barrier Film (skin prep) 06/19/2017  3:00 PM  Dressing Changed 06/19/2017  3:00 PM  Dressing Change Frequency Twice a day 06/19/2017  3:00 PM  State of Healing Early/partial granulation 06/19/2017  3:00 PM  Site / Wound Assessment Pink;Yellow;Painful;Granulation tissue 06/19/2017  3:00 PM  % Wound base Red or Granulating 70% 06/19/2017  3:00 PM  % Wound base Yellow/Fibrinous Exudate 30% 06/19/2017  3:00 PM  Peri-wound Assessment Pink;Intact 06/16/2017  7:40 AM  Wound Length (cm) 8.5 cm 06/17/2017  4:00 PM  Wound Width (cm) 6 cm 06/17/2017  4:00 PM  Wound Depth (cm) 5 cm 06/17/2017  4:00 PM  Wound Surface Area (cm^2) 51 cm^2 06/17/2017  4:00 PM  Wound Volume (cm^3) 255 cm^3 06/17/2017  4:00 PM  Tunneling (cm) 4 cm 3-4 o'clock 06/17/2017  4:00 PM  Undermining (cm) 1 cm 12-3 o'clock  06/17/2017  4:00 PM  Margins Unattached edges (unapproximated) 06/19/2017  3:00 PM  Drainage Amount Minimal 06/19/2017  3:00 PM  Drainage Description Serosanguineous 06/19/2017  3:00 PM  Treatment Packing (Saline gauze);Other (Comment) 06/19/2017  1:00 AM     Wound / Incision (Open or Dehisced) 06/17/17 Incision - Open Buttocks Right;Lateral R gluteal surgical debridement of abcess  (Active)  Dressing Type ABD;Gauze (Comment);Moist to dry;Barrier Film (skin prep) 06/19/2017  3:00 PM  Dressing  Changed Changed 06/18/2017 11:00 AM  Dressing Status New drainage 06/19/2017  3:00 PM  Dressing Change Frequency Daily 06/19/2017  3:00 PM  Site / Wound Assessment Bleeding;Red;Granulation tissue 06/19/2017  3:00 PM  % Wound base Red or Granulating 100% 06/19/2017  3:00 PM  Wound Length (cm) 9 cm 06/17/2017  4:00 PM  Wound Width (cm) 7 cm 06/17/2017  4:00 PM  Wound Depth (cm) 5 cm 06/17/2017  4:00 PM  Wound Volume (cm^3) 315 cm^3 06/17/2017  4:00 PM  Wound Surface Area (cm^2) 63 cm^2 06/17/2017  4:00 PM  Closure None 06/19/2017  3:00 PM  Drainage Amount Copious 06/19/2017  3:00 PM  Drainage Description Serosanguineous;Sanguineous 06/19/2017  3:00 PM  Treatment Packing Dry Kerlix  06/18/2017 11:00 AM      Hydrotherapy Pulsed Lavage with Suction (psi): Held today  Wound Assessment and Plan  Wound Therapy - Assess/Plan/Recommendations Wound Therapy - Clinical Statement: When pt turned to perform hydrotherapy, pt sitting in pool of liquid and clotted blood. RN notifed and Surgeon MD and Hospitalist paged.  Both MDs present at bedside for dressing change and discussed stopping Eliquis. Assisted surgeon with redressed both wounds and postponed hydrotherapy until Monday.  Factors Delaying/Impairing Wound Healing: Immobility;Multiple medical problems;Diabetes Mellitus Hydrotherapy Plan: Pulsatile lavage with suction;Patient/family education;Dressing change Wound Therapy - Frequency: 6X / week Wound Therapy - Follow Up Recommendations: Skilled nursing facility Wound Plan: see above  Wound Therapy Goals- Improve the function of patient's integumentary system by progressing the wound(s) through the phases of wound healing (inflammation -  proliferation - remodeling) by: Decrease Necrotic Tissue to: 20 Decrease Necrotic Tissue - Progress: Not progressing Increase Granulation Tissue to: 80 Increase Granulation Tissue - Progress: Mot progressing Goals/treatment plan/discharge plan were made with and agreed upon by  patient/family: Yes Time For Goal Achievement: 7 days Wound Therapy - Potential for Goals: Fair  Goals will be updated until maximal potential achieved or discharge criteria met.  Discharge criteria: when goals achieved, discharge from hospital, MD decision/surgical intervention, no progress towards goals, refusal/missing three consecutive treatments without notification or medical reason.  GP    Dani Gobble. [name] PT, DPT Acute Rehabilitation  [phone] Pager [phone]   [place] 06/19/2017, 3:14 PM

## 2017-06-19 NOTE — Progress Notes (Signed)
Pharmacy Antibiotic Note  John Bailey is a 47 y.o. male admitted on 06/08/2017 with osteo and  sacral decubitus ulcer on hydrotherapy.  Pharmacy was been consulted for [facility].  -Abscess cultures with morganella morganii (sens to rocephin) -Last HD 4/6  Plan: -Continue Fortaz  2gm IV with HD TThS (Tentative end date 5/13) -Will follow clinical progress  Height: 6' (182.9 cm) Weight: 275 lb (124.7 kg) IBW/kg (Calculated) : 77.6  Temp (24hrs), Avg:98.9 F (37.2 C), Min:97.7 F (36.5 C), Max:100.5 F (38.1 C)  Recent Labs  Lab 06/14/17 1644 06/15/17 0240 06/16/17 0259 06/17/17 0351 06/18/17 0317 06/19/17 0251  WBC 19.7* 21.7* 21.4* 18.4* 30.9* 30.9*  CREATININE 7.00* 7.53* 4.85* 6.25* 4.26* 5.51*  LATICACIDVEN 1.8  --   --   --   --   --     Estimated Creatinine Clearance: 23.3 mL/min (A) (by C-G formula based on SCr of 5.51 mg/dL (H)).    No Known Allergies   Antimicrobials this admission: Fortaz 4/3>>(5/13) Zosyn 4/1 >> 4/3 Vanc 4/1 >> 4/4  Cultures: 4/3 Blood x2- no growth to date 4/1 Abscess - morganella morganii (sens to rocephin) 3/31 Blood: 1/2 MRSE (contamination) 3/26 & 4/2 MRSA PCR neg   Thank you for allowing pharmacy to be a part of this patient's care.   Diana L. Kyung Rudd, PharmD, [place] PGY1 Pharmacy Resident Pager: [phone]

## 2017-06-19 NOTE — Progress Notes (Signed)
I was present at this dialysis session. I have reviewed the session itself and made appropriate changes.   Filed Weights   06/18/17 0500 06/19/17 0258 06/19/17 0700  Weight: 127 kg (280 lb) 128.4 kg (283 lb) 124.7 kg (275 lb)    Recent Labs  Lab 06/19/17 0251  NA 135  K 4.2  CL 97*  CO2 22  GLUCOSE 109*  BUN 24*  CREATININE 5.51*  CALCIUM 8.1*    Recent Labs  Lab 06/17/17 0351 06/18/17 0317 06/19/17 0251  WBC 18.4* 30.9* 30.9*  NEUTROABS 15.2* 26.5* 28.5*  HGB 7.8* 6.6* 7.1*  HCT 25.4* 20.5* 22.0*  MCV 87.3 90.3 88.7  PLT 521* 452* 460*    Scheduled Meds: . amiodarone  200 mg Oral Daily  . apixaban  5 mg Oral BID  . calcitRIOL  0.5 mcg Oral Q T,Th,Sat-1800  . darbepoetin (ARANESP) injection - DIALYSIS  150 mcg Intravenous Q Thu-HD  . insulin aspart  0-15 Units Subcutaneous TID WC  . insulin aspart  0-5 Units Subcutaneous QHS  . insulin aspart  5 Units Subcutaneous TID WC  . insulin glargine  25 Units Subcutaneous QHS   Continuous Infusions: . sodium chloride    . sodium chloride    . sodium chloride    . cefTAZidime (FORTAZ)  IV Stopped (06/17/17 1211)   PRN Meds:.acetaminophen **OR** acetaminophen, bisacodyl, HYDROcodone-acetaminophen, methocarbamol, naloxone, ondansetron **OR** ondansetron (ZOFRAN) IV, senna-docusate, traMADol      Assessment/ Plan:   1. AMSdue to gluteal abscess and early sepsis-s/p I&D of abscess with marked improvement in BP and mentation. He is more arousable today. Appreciate surgery's assistance.  2. ESRDcontinue with HD q TTS while he remains an inpatient.  3. Anemia:on ESA now with drop in hgb post op, s/p transfusion yesterday and will give another today with HD tomorrow. 4. CKD-MBD:cont with vit D will need binders and to follow phos when able to take po 5. Nutrition:renal diet 6. Hypertension:stable and continue with UF. 7. Vascular access- LAVF placed 05/10/17, RIJ tdc. Question maturation of AVF. Will need to f/u  with [facility]. 8. Decubitus ulcers- s/p debridement 06/04/17. Continue with wound care 9. A fib- per primary 10. Rheumatoid arthritis flare- on prednisone taper. 11. Deconditioning- continue with PT/OT  Donetta Potts,  MD 06/19/2017, 9:00 AM

## 2017-06-19 NOTE — Progress Notes (Signed)
Sacral dressing changed due to soiled with stool. Saline soaked kerlix and abd applied.  Packing in R gluteal wound not soiled. Packing left in place and new ABD applied and reinforced.

## 2017-06-19 NOTE — Progress Notes (Signed)
Dressings on right ischial and sacral wounds changed per Dr. Redmond Pulling. See NO.  Dressing not to be changed again until tomorrow to allow for claudication of wounds.  No further active bleeding noted at this time.  Patient also noted to have inflammation of joints, especially if hands and lower legs due to RA.  Dr. Algis Liming aware.

## 2017-06-19 NOTE — Progress Notes (Signed)
INFECTIOUS DISEASE PROGRESS NOTE  ID: John Bailey is a 47 y.o. 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)   Nonischemic cardiomyopathy (HCC)   Chronic diastolic CHF (congestive heart failure) (HCC)   Cardiorenal syndrome, stage 1-4 or unspecified chronic kidney disease, with heart failure (HCC)   ESRD on dialysis ([place])   HSV (herpes simplex virus) infection   Neuropathic pain   Pressure injury of sacral region, unstageable ([place])  Subjective: No complaints  Abtx:  Anti-infectives (From admission, onward)   Start     Dose/Rate Route Frequency Ordered Stop   06/17/17 1200  cefTAZidime (FORTAZ) 2 g in sodium chloride 0.9 % 100 mL IVPB     2 g 200 mL/hr over 30 Minutes Intravenous Every T-Th-Sa (Hemodialysis) 06/16/17 1001     06/17/17 0837  vancomycin (VANCOCIN) 1-5 GM/200ML-% IVPB    Note to Pharmacy:  Louretta Shorten   : cabinet override      06/17/17 0837 06/17/17 1123   06/16/17 1100  cefTAZidime (FORTAZ) 2 g in sodium chloride 0.9 % 100 mL IVPB     2 g 200 mL/hr over 30 Minutes Intravenous  Once 06/16/17 1001 06/16/17 1137   06/15/17 1502  vancomycin (VANCOCIN) 1-5 GM/200ML-% IVPB    Note to Pharmacy:  Herriott, Melisa   : cabinet override      06/15/17 1502 06/15/17 1553   06/15/17 1200  vancomycin (VANCOCIN) IVPB 1000 mg/200 mL premix  Status:  Discontinued     1,000 mg 200 mL/hr over 60 Minutes Intravenous Every T-Th-Sa (Hemodialysis) 06/14/17 1256 06/17/17 1153   06/14/17 1330  vancomycin (VANCOCIN) 2,000 mg in sodium chloride 0.9 % 500 mL IVPB     2,000 mg 250 mL/hr over 120 Minutes Intravenous  Once 06/14/17 1256 06/14/17 2229   06/14/17 1130  piperacillin-tazobactam (ZOSYN) IVPB 3.375 g  Status:   Discontinued     3.375 g 12.5 mL/hr over 240 Minutes Intravenous Every 12 hours 06/14/17 1057 06/16/17 0936      Medications:  Scheduled: . amiodarone  200 mg Oral Daily  . apixaban  5 mg Oral BID  . calcitRIOL  0.5 mcg Oral Q T,Th,Sat-1800  . darbepoetin (ARANESP) injection - DIALYSIS  150 mcg Intravenous Q Thu-HD  . insulin aspart  0-15 Units Subcutaneous TID WC  . insulin aspart  0-5 Units Subcutaneous QHS  . insulin aspart  5 Units Subcutaneous TID WC  . insulin glargine  25 Units Subcutaneous QHS    Objective: Vital signs in last 24 hours: Temp:  [98 F (36.7 C)-100.5 F (38.1 C)] 98.8 F (37.1 C) (04/06 1306) Pulse Rate:  [96-114] 114 (04/06 1306) Resp:  [16-26] 20 (04/06 1306) BP: (93-142)/(52-75) 113/69 (04/06 1306) SpO2:  [93 %-100 %] 99 % (04/06 1306) Weight:  [121.7 kg (268 lb 4.8 oz)-128.4 kg (283 lb)] 121.7 kg (268 lb 4.8 oz) (04/06 1113)   General appearance: alert, cooperative and no distress Resp: clear to auscultation bilaterally Cardio: regular rate and rhythm GI: normal findings: bowel sounds normal and soft, non-tender  Lab Results Recent Labs    06/18/17 0317 06/19/17 0251  WBC 30.9* 30.9*  HGB 6.6* 7.1*  HCT 20.5* 22.0*  NA 135 135  K 3.5 4.2  CL 97* 97*  CO2 24 22  BUN 16 24*  CREATININE 4.26* 5.51*   Liver Panel No results for input(s): PROT, ALBUMIN, AST, ALT, ALKPHOS, BILITOT, BILIDIR, IBILI in the last 72 hours. Sedimentation Rate No results for input(s): ESRSEDRATE in the last 72 hours. C-Reactive Protein No results for input(s): CRP in the last 72 hours.  Microbiology: Recent Results (from the past 240 hour(s))  Culture, blood (routine x 2)     Status: None   Collection Time: 06/13/17 11:26 AM  Result Value Ref Range Status   Specimen Description BLOOD RIGHT HAND  Final   Special Requests   Final    IN BOTH AEROBIC AND ANAEROBIC BOTTLES Blood Culture adequate volume   Culture   Final    NO GROWTH 5 DAYS Performed at  [facility], [address], [place], [place] [address]    Report Status 06/18/2017 FINAL  Final  Culture, blood (routine x 2)     Status: Abnormal   Collection Time: 06/13/17 11:26 AM  Result Value Ref Range Status   Specimen Description BLOOD RIGHT HAND  Final   Special Requests   Final    IN BOTH AEROBIC AND ANAEROBIC BOTTLES Blood Culture adequate volume   Culture  Setup Time   Final    GRAM POSITIVE COCCI ANAEROBIC BOTTLE ONLY CRITICAL RESULT CALLED TO, READ BACK BY AND VERIFIED WITH: Karlene Einstein PharmD 14:40 06/14/17 ([email or web address])    Culture (A)  Final    STAPHYLOCOCCUS SPECIES (COAGULASE NEGATIVE) THE SIGNIFICANCE OF ISOLATING THIS ORGANISM FROM A SINGLE SET OF BLOOD CULTURES WHEN MULTIPLE SETS ARE DRAWN IS UNCERTAIN. PLEASE NOTIFY THE MICROBIOLOGY DEPARTMENT WITHIN ONE WEEK IF SPECIATION AND SENSITIVITIES ARE REQUIRED. Performed at [facility], [place] [address], [place], [place] [address]    Report Status 06/16/2017 FINAL  Final  Blood Culture ID Panel (Reflexed)     Status: Abnormal   Collection Time: 06/13/17 11:26 AM  Result Value Ref Range Status   Enterococcus species NOT DETECTED NOT DETECTED Final   Listeria monocytogenes NOT DETECTED NOT DETECTED Final   Staphylococcus species DETECTED (A) NOT DETECTED Final    Comment: Methicillin (oxacillin) resistant coagulase negative staphylococcus. Possible blood culture contaminant (unless isolated from more than one blood culture draw or clinical case suggests pathogenicity). No antibiotic treatment is indicated for blood  culture contaminants. CRITICAL RESULT CALLED TO, READ BACK BY AND VERIFIED WITH: Karlene Einstein PharmD 14:40 06/14/17 ([email or web address])    Staphylococcus aureus NOT DETECTED NOT DETECTED Final   Methicillin resistance DETECTED (A) NOT DETECTED Final    Comment: CRITICAL RESULT CALLED TO, READ BACK BY AND VERIFIED WITH: Karlene Einstein PharmD 14:40 06/14/17 ([email or web address])    Streptococcus species NOT DETECTED NOT  DETECTED Final   Streptococcus agalactiae NOT DETECTED NOT DETECTED Final   Streptococcus pneumoniae NOT DETECTED NOT DETECTED Final   Streptococcus pyogenes NOT DETECTED NOT DETECTED Final   Acinetobacter baumannii NOT DETECTED NOT DETECTED Final   Enterobacteriaceae species NOT DETECTED NOT DETECTED Final   Enterobacter cloacae complex NOT DETECTED NOT DETECTED Final   Escherichia coli NOT DETECTED NOT DETECTED Final   Klebsiella oxytoca NOT DETECTED NOT DETECTED Final   Klebsiella pneumoniae NOT DETECTED NOT DETECTED Final   Proteus species NOT DETECTED NOT DETECTED Final   Serratia marcescens NOT DETECTED NOT DETECTED Final   Haemophilus influenzae NOT DETECTED NOT DETECTED Final   Neisseria meningitidis NOT DETECTED NOT DETECTED Final   Pseudomonas aeruginosa NOT DETECTED NOT DETECTED Final   Candida albicans  NOT DETECTED NOT DETECTED Final   Candida glabrata NOT DETECTED NOT DETECTED Final   Candida krusei NOT DETECTED NOT DETECTED Final   Candida parapsilosis NOT DETECTED NOT DETECTED Final   Candida tropicalis NOT DETECTED NOT DETECTED Final    Comment: Performed at [facility], [place] [address], [place], [place] [address]  Aerobic/Anaerobic Culture (surgical/deep wound)     Status: None   Collection Time: 06/14/17  7:52 PM  Result Value Ref Range Status   Specimen Description ABSCESS GLUTEAL  Final   Special Requests PATIENT ON FOLLOWING ZOSYN AND VANC  Final   Gram Stain   Final    FEW WBC PRESENT, PREDOMINANTLY PMN FEW GRAM POSITIVE COCCI FEW GRAM NEGATIVE RODS RARE GRAM POSITIVE RODS Performed at [facility], [place] [address], [place], [place] [address]    Culture   Final    FEW MORGANELLA MORGANII MIXED ANAEROBIC FLORA PRESENT.  CALL LAB IF FURTHER IID REQUIRED.    Report Status 06/17/2017 FINAL  Final   Organism ID, Bacteria MORGANELLA MORGANII  Final      Susceptibility   Morganella morganii - MIC*    AMPICILLIN >=32 RESISTANT Resistant     CEFAZOLIN  >=64 RESISTANT Resistant     CEFEPIME <=1 SENSITIVE Sensitive     CEFTAZIDIME <=1 SENSITIVE Sensitive     CEFTRIAXONE <=1 SENSITIVE Sensitive     CIPROFLOXACIN <=0.25 SENSITIVE Sensitive     GENTAMICIN <=1 SENSITIVE Sensitive     IMIPENEM 1 SENSITIVE Sensitive     TRIMETH/SULFA <=20 SENSITIVE Sensitive     AMPICILLIN/SULBACTAM 16 INTERMEDIATE Intermediate     PIP/TAZO <=4 SENSITIVE Sensitive     * FEW MORGANELLA MORGANII  MRSA PCR Screening     Status: None   Collection Time: 06/15/17 12:48 AM  Result Value Ref Range Status   MRSA by PCR NEGATIVE NEGATIVE Final    Comment:        The GeneXpert MRSA Assay (FDA approved for NASAL specimens only), is one component of a comprehensive MRSA colonization surveillance program. It is not intended to diagnose MRSA infection nor to guide or monitor treatment for MRSA infections. Performed at [facility], [place] [address], [place], [place] [address]   Culture, blood (routine x 2)     Status: None (Preliminary result)   Collection Time: 06/16/17 10:01 AM  Result Value Ref Range Status   Specimen Description BLOOD RIGHT ANTECUBITAL  Final   Special Requests   Final    BOTTLES DRAWN AEROBIC AND ANAEROBIC Blood Culture results may not be optimal due to an excessive volume of blood received in culture bottles   Culture   Final    NO GROWTH 3 DAYS Performed at [facility], [place] [address] [place], [place] [address]    Report Status PENDING  Incomplete  Culture, blood (routine x 2)     Status: None (Preliminary result)   Collection Time: 06/16/17 10:01 AM  Result Value Ref Range Status   Specimen Description BLOOD RIGHT HAND  Final   Special Requests   Final    BOTTLES DRAWN AEROBIC AND ANAEROBIC Blood Culture results may not be optimal due to an inadequate volume of blood received in culture bottles   Culture   Final    NO GROWTH 3 DAYS Performed at [facility], [place] [address] [place], [place] [address]    Report  Status PENDING  Incomplete    Studies/Results: No results found.  Assessment/Plan: Sacral decubitus Osteomyelitis ESRD  Total days of antibiotics: 5 (ceftaz)  Continue ceftaz with HD Cx notes some anaerobes. He has been debrided and his wounds are open.  Watch WBC Plan for 6 weeks Please do not hesitate if concerns, available as needed.          Bobby Rumpf MD, FACP Infectious Diseases (pager) [phone] [email or web address] 06/19/2017, 1:09 PM  LOS: 11 days

## 2017-06-19 NOTE — Progress Notes (Signed)
Patient noted to have copious amt of fresh blood from gluteal wound with large sanguinous clots also noted.  Bleeding does not appear to be fluid in nature at this time.  Dr. Redmond Pulling and Dr. Algis Liming notified as his hemoglobin has been tenuous at times and he is on Eliquis.

## 2017-06-19 NOTE — Progress Notes (Signed)
Patient ID: John Bailey, male   DOB: 31-May-1970, 47 y.o.   MRN: [id number]  PROGRESS NOTE    ARLANDER GILLEN  HQP:[id number] DOB: [birth year] DOA: 06/08/2017 PCP: [name], MD   Brief Narrative:  47 year old male with history of RA, PVD, hypertension, end-stage renal disease on hemodialysis, A. fib on Eliquis, diabetes and chronic diastolic CHF and sacral and coccyx pressure ulcer who was admitted from 04/22/2017-05/13/2017 with anasarca from acute on chronic diastolic heart failure and A. fib with RVR (cardioverted) and was discharged to inpatient rehab for debility.  He underwent debridement of sacral decubitus ulcer on 06/04/2017 by general surgery.  He was supposed to be discharged from [facility] on 06/09/2017 but general surgery recommended continued inpatient wound care so patient was readmitted to [facility] on 06/09/2017.  He required debridement of the right gluteal abscess on 06/14/2017 by general surgery.  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  One set of blood culture 3/31 grew coagulase-negative staph, probably a contaminant.  Second set of blood culture 3/31, negative and final report.  Blood cultures x2 from 4/3: Negative to  date. 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 -Noted excessive bleeding from wound site on 4/6.  Surgeons have packed wound.  As discussed with surgery, temporarily hold Eliquis until reassessment and advised by them.  Acute metabolic encephalopathy -Probably secondary to above.  Mental status changes may have resolved.  CT of the head was negative for acute abnormality  Unstageable Sacral ulcer status post debridement -Debridement was done by Dr. Grandville Silos on 06/04/2017.   -General surgery following.  -Hydrotherapy will be resumed from today as per surgery recommendations.  Dressing changes as per general surgery. -Antibiotic management as indicated above. -This wound was not bleeding during exam 4/6.  Some slough noted at the base.  Pictures as below.  ESRD on HD  -Nephrology following.  Dialysis as per nephrology schedule.  Underwent HD on 4/6.  Patient was seen this morning at HD and then again at bedside later in the afternoon.  He received 1 unit PRBC across HD 4/6.  Hypertension - Coreg on hold due to soft blood pressures earlier on.  Stable now.  Type II Diabetes  -Continue current dose of Levemir along with NovoLog with meals.  Continue Accu-Cheks with coverage.  Reasonable inpatient control.  Leukocytosis, likely reactive, related to underlying sacral wound and steroids in the setting of RA   -Leukocytosis has worsened from 18-30, likely due to hydrotherapy.  Antibiotics as above.  No change  in leukocytosis.  Anemia of chronic disease  and acute blood loss anemia. -Hemoglobin dropped to 6.6 on 4/5.  Transfuse 1 unit PRBC on 4/5 and second unit at dialysis on 4/6.  Due to  ongoing right gluteal wound bleeding, will monitor CBCs later this evening and closely and transfuse as needed for hemoglobin <7 g per DL.  No posttransfusion CBC done after transfusion at dialysis and will check later this evening.  Atrial Fibrillation CHA2DS2-VASc score 2-3 , was on anticoagulation with Eliquis. Echo 04/25/17  EF 54-65, normal systolic  -Status post recent cardioversion during last hospitalization.  Currently rate controlled Continue amiodarone.  Hold Coreg.   Eliquis held 4/6 due to bleeding from wound.  Resume when cleared by general surgery.  Chronic diastolic heart failure.   -Recent hospitalization for acute decompensated heart failure. -Currently compensated and volume is managed by dialysis.  Hold Coreg.  Outpatient follow-up with cardiology  Rheumatoid arthritis with flare Continue 6-week prednisone taper which was started on 05/19/2017. outpatient follow-up with rheumatology.  Patient had been receiving abatacept every Tuesday prior to admission; will hold it for now. It appears that patient completed prednisone taper on 06/12/17.  On 4/6, patient complains of diffuse joint pains consistent with prior RA flare.  Started prednisone at 30 mg daily.  Deconditioning  -Continue PT/OT  Morbid obesity -Outpatient follow-up  DVT prophylaxis:  Eliquis> held on 4/6. Code Status:    Full  Family Communication:   None at bedside. Disposition Plan: SNF vs LTAC once clinically improves  Consultants: General surgery/nephrology/ID  Procedures: None  Antimicrobials: Zosyn from 06/14/2017-06/15/17 Vancomycin from 06/14/2017 onwards-discontinued 4/5. Ceftazidime from 06/16/2017 onwards   Subjective: I discussed with RN this morning who reported minimal bleeding from his sacral wounds yesterday and this morning.  I then saw him at dialysis and patient denied complaints and he did not report pain at that time.  He returned to a new hospital room post dialysis.  I was paged by RN due  to increased bleeding from his right gluteal wound.  I immediately went to bedside and interviewed and examined patient along with the surgeons who evaluated wound and packed it.  He reported pain in his hand joints and lower extremities without distinct swelling.  He reports this is consistent with his prior RA.  Objective: Vitals:   06/19/17 1030 06/19/17 1100 06/19/17 1113 06/19/17 1306  BP: (!) 119/52 (!) 106/59 106/61 113/69  Pulse: (!) 108 (!) 112 (!) 108 (!) 114  Resp: (!) 22 (!) 23 (!) 21 20  Temp:   98.5 F (36.9 C) 98.8 F (37.1 C)  TempSrc:   Oral Oral  SpO2:   99% 99%  Weight:   121.7 kg (268 lb 4.8 oz)   Height:        Intake/Output Summary (Last 24 hours) at 06/19/2017 1424 Last data filed at 06/19/2017 1113 Gross per 24 hour  Intake 909 ml  Output 3002 ml  Net -2093 ml   Filed Weights   06/19/17 0258 06/19/17 0700 06/19/17 1113  Weight: 128.4 kg (283 lb) 124.7 kg (275 lb) 121.7 kg (268 lb 4.8 oz)    Examination:  General exam: Pleasant young male, moderately built and nourished lying comfortably propped up in bed.  Does not appear in any distress. respiratory system: Slightly diminished breath sounds in the bases but otherwise clear to auscultation.  No increased work of breathing. cardiovascular system: S1 and S2 heard, mild regular tachycardia.  No JVD, murmurs or pedal edema.  gastrointestinal system: Abdomen is obese, nondistended, soft and nontender. Normal bowel sounds heard.  Stable. Extremities: Moves all limbs symmetrically.  Slight warmth, mild tenderness and painful range of movements of small joints of his hands, bilateral wrists and knees. CNS: Alert and oriented.  No focal neurological deficits. Skin: Large right gluteal wound status post I&D with bleeding but appears clean.  As per CCS, no splitting vessels.  Wound was packed with gauze.  Large mid sacral wound with some slough at the base but otherwise clean and no bleeding.  Please see pictures below  taken 4/6.   Right gluteal wound post I&D.  06/19/17.   Mid sacral wound.  06/19/17.   Data Reviewed: I have personally reviewed following labs and imaging studies  CBC: Recent Labs  Lab 06/15/17 0240 06/16/17 0259 06/17/17 0351 06/18/17 0317 06/19/17 0251  WBC 21.7* 21.4* 18.4* 30.9* 30.9*  NEUTROABS 20.4* 18.9* 15.2* 26.5* 28.5*  HGB 7.1* 7.4* 7.8* 6.6* 7.1*  HCT 23.3* 24.0* 25.4* 20.5* 22.0*  MCV 88.9 86.3 87.3 90.3 88.7  PLT 357 409* 521* 452* 614*   Basic Metabolic Panel: Recent Labs  Lab 06/15/17 0240 06/16/17 0259 06/17/17 0351 06/18/17 0317 06/19/17 0251  NA 131* 134* 135 135 135  K 5.1 5.1 3.6 3.5 4.2  CL 96* 96* 96* 97* 97*  CO2 21* 22 25 24 22   GLUCOSE 172* 152* 115* 116* 109*  BUN 48* 28* 35* 16 24*  CREATININE 7.53* 4.85* 6.25* 4.26* 5.51*  CALCIUM 8.4* 7.9* 7.8* 7.7* 8.1*  MG 1.6* 1.9 1.9 1.8 1.9   GFR: Estimated Creatinine Clearance: 23 mL/min (A) (by C-G formula based on SCr of 5.51 mg/dL (H)). Liver Function Tests: Recent Labs  Lab 06/14/17 0752 06/14/17 1644 06/15/17 0240  AST 17 33 23  ALT 10* 14* 16*  ALKPHOS 83 85 96  BILITOT 0.7 1.1 0.8  PROT 5.9* 5.4* 5.7*  ALBUMIN 1.8* 1.7* 1.7*    Recent Labs  Lab 06/14/17 1644  AMMONIA 23   CBG: Recent Labs  Lab 06/18/17 0848 06/18/17 1222 06/18/17 1629 06/18/17 2140 06/19/17 1223  GLUCAP 139* 147* 118* 121* 99   Sepsis Labs: Recent Labs  Lab 06/14/17 1644  LATICACIDVEN 1.8    Recent Results (from the past 240 hour(s))  Culture, blood (routine x 2)     Status: None   Collection Time: 06/13/17 11:26 AM  Result Value Ref Range Status   Specimen Description BLOOD RIGHT HAND  Final   Special Requests   Final    IN BOTH AEROBIC AND ANAEROBIC BOTTLES Blood Culture adequate volume   Culture   Final    NO GROWTH 5 DAYS Performed at [facility], [place] [address], [place], [place] [address]    Report Status 06/18/2017 FINAL  Final  Culture, blood (routine x 2)     Status:  Abnormal   Collection Time: 06/13/17 11:26 AM  Result Value Ref Range Status   Specimen Description BLOOD RIGHT HAND  Final   Special Requests   Final    IN BOTH AEROBIC AND ANAEROBIC BOTTLES Blood Culture adequate volume   Culture  Setup Time   Final    GRAM POSITIVE COCCI ANAEROBIC BOTTLE ONLY CRITICAL RESULT CALLED TO, READ BACK BY AND VERIFIED WITH: Karlene Einstein PharmD 14:40 06/14/17 ([email or web address])    Culture (A)  Final    STAPHYLOCOCCUS SPECIES (COAGULASE NEGATIVE) THE SIGNIFICANCE OF ISOLATING THIS ORGANISM FROM A SINGLE SET OF BLOOD CULTURES WHEN MULTIPLE SETS ARE DRAWN  IS UNCERTAIN. PLEASE NOTIFY THE MICROBIOLOGY DEPARTMENT WITHIN ONE WEEK IF SPECIATION AND SENSITIVITIES ARE REQUIRED. Performed at [facility], [place] [address] [place], [place] [address]    Report Status 06/16/2017 FINAL  Final  Blood Culture ID Panel (Reflexed)     Status: Abnormal   Collection Time: 06/13/17 11:26 AM  Result Value Ref Range Status   Enterococcus species NOT DETECTED NOT DETECTED Final   Listeria monocytogenes NOT DETECTED NOT DETECTED Final   Staphylococcus species DETECTED (A) NOT DETECTED Final    Comment: Methicillin (oxacillin) resistant coagulase negative staphylococcus. Possible blood culture contaminant (unless isolated from more than one blood culture draw or clinical case suggests pathogenicity). No antibiotic treatment is indicated for blood  culture contaminants. CRITICAL RESULT CALLED TO, READ BACK BY AND VERIFIED WITH: Karlene Einstein PharmD 14:40 06/14/17 ([email or web address])    Staphylococcus aureus NOT DETECTED NOT DETECTED Final   Methicillin resistance DETECTED (A) NOT DETECTED Final    Comment: CRITICAL RESULT CALLED TO, READ BACK BY AND VERIFIED WITH: Karlene Einstein PharmD 14:40 06/14/17 ([email or web address])    Streptococcus species NOT DETECTED NOT DETECTED Final   Streptococcus agalactiae NOT DETECTED NOT DETECTED Final   Streptococcus pneumoniae NOT DETECTED NOT DETECTED Final   Streptococcus  pyogenes NOT DETECTED NOT DETECTED Final   Acinetobacter baumannii NOT DETECTED NOT DETECTED Final   Enterobacteriaceae species NOT DETECTED NOT DETECTED Final   Enterobacter cloacae complex NOT DETECTED NOT DETECTED Final   Escherichia coli NOT DETECTED NOT DETECTED Final   Klebsiella oxytoca NOT DETECTED NOT DETECTED Final   Klebsiella pneumoniae NOT DETECTED NOT DETECTED Final   Proteus species NOT DETECTED NOT DETECTED Final   Serratia marcescens NOT DETECTED NOT DETECTED Final   Haemophilus influenzae NOT DETECTED NOT DETECTED Final   Neisseria meningitidis NOT DETECTED NOT DETECTED Final   Pseudomonas aeruginosa NOT DETECTED NOT DETECTED Final   Candida albicans NOT DETECTED NOT DETECTED Final   Candida glabrata NOT DETECTED NOT DETECTED Final   Candida krusei NOT DETECTED NOT DETECTED Final   Candida parapsilosis NOT DETECTED NOT DETECTED Final   Candida tropicalis NOT DETECTED NOT DETECTED Final    Comment: Performed at [facility], [place]. [address] [place], [place] [address]  Aerobic/Anaerobic Culture (surgical/deep wound)     Status: None   Collection Time: 06/14/17  7:52 PM  Result Value Ref Range Status   Specimen Description ABSCESS GLUTEAL  Final   Special Requests PATIENT ON FOLLOWING ZOSYN AND VANC  Final   Gram Stain   Final    FEW WBC PRESENT, PREDOMINANTLY PMN FEW GRAM POSITIVE COCCI FEW GRAM NEGATIVE RODS RARE GRAM POSITIVE RODS Performed at [facility], [place] [address], [place], [place] [address]    Culture   Final    FEW MORGANELLA MORGANII MIXED ANAEROBIC FLORA PRESENT.  CALL LAB IF FURTHER IID REQUIRED.    Report Status 06/17/2017 FINAL  Final   Organism ID, Bacteria MORGANELLA MORGANII  Final      Susceptibility   Morganella morganii - MIC*    AMPICILLIN >=32 RESISTANT Resistant     CEFAZOLIN >=64 RESISTANT Resistant     CEFEPIME <=1 SENSITIVE Sensitive     CEFTAZIDIME <=1 SENSITIVE Sensitive     CEFTRIAXONE <=1 SENSITIVE Sensitive      CIPROFLOXACIN <=0.25 SENSITIVE Sensitive     GENTAMICIN <=1 SENSITIVE Sensitive     IMIPENEM 1 SENSITIVE Sensitive     TRIMETH/SULFA <=20 SENSITIVE Sensitive     AMPICILLIN/SULBACTAM 16  INTERMEDIATE Intermediate     PIP/TAZO <=4 SENSITIVE Sensitive     * FEW MORGANELLA MORGANII  MRSA PCR Screening     Status: None   Collection Time: 06/15/17 12:48 AM  Result Value Ref Range Status   MRSA by PCR NEGATIVE NEGATIVE Final    Comment:        The GeneXpert MRSA Assay (FDA approved for NASAL specimens only), is one component of a comprehensive MRSA colonization surveillance program. It is not intended to diagnose MRSA infection nor to guide or monitor treatment for MRSA infections. Performed at [facility], [place] [address], [place], [place] [address]   Culture, blood (routine x 2)     Status: None (Preliminary result)   Collection Time: 06/16/17 10:01 AM  Result Value Ref Range Status   Specimen Description BLOOD RIGHT ANTECUBITAL  Final   Special Requests   Final    BOTTLES DRAWN AEROBIC AND ANAEROBIC Blood Culture results may not be optimal due to an excessive volume of blood received in culture bottles   Culture   Final    NO GROWTH 3 DAYS Performed at [facility], [place] [address], [place], [place] [address]    Report Status PENDING  Incomplete  Culture, blood (routine x 2)     Status: None (Preliminary result)   Collection Time: 06/16/17 10:01 AM  Result Value Ref Range Status   Specimen Description BLOOD RIGHT HAND  Final   Special Requests   Final    BOTTLES DRAWN AEROBIC AND ANAEROBIC Blood Culture results may not be optimal due to an inadequate volume of blood received in culture bottles   Culture   Final    NO GROWTH 3 DAYS Performed at [facility], [place] [address] [place], [place] [address]    Report Status PENDING  Incomplete         Radiology Studies: No results found.      Scheduled Meds: . amiodarone  200 mg Oral Daily  .  calcitRIOL  0.5 mcg Oral Q T,Th,Sat-1800  . darbepoetin (ARANESP) injection - DIALYSIS  150 mcg Intravenous Q Thu-HD  . insulin aspart  0-15 Units Subcutaneous TID WC  . insulin aspart  0-5 Units Subcutaneous QHS  . insulin aspart  5 Units Subcutaneous TID WC  . insulin glargine  25 Units Subcutaneous QHS   Continuous Infusions: . sodium chloride    . sodium chloride    . sodium chloride    . cefTAZidime (FORTAZ)  IV Stopped (06/19/17 1113)     LOS: 11 days        [place] Leep, MD, FACP, [facility] Pager [phone]  If 7PM-7AM, please contact night-coverage [email or web address] Password [facility] 06/19/2017, 2:24 PM

## 2017-06-19 NOTE — Progress Notes (Signed)
PT Cancellation Note  Patient Details Name: John Bailey MRN: [id number] DOB: Feb 02, 1971   Cancelled Treatment:    Reason Eval/Treat Not Completed: (P) Medical issues which prohibited therapy PT could not perform Hydrotherapy today on sacral wound due to increased bleeding of gluteal wound. Waiting on surgical consult. Will follow back as able.  Mykal Kirchman B. [name] PT, DPT Acute Rehabilitation  [phone] Pager [phone]      [place] 06/19/2017, 1:54 PM

## 2017-06-19 NOTE — Progress Notes (Signed)
Patient ID: John Bailey, male   DOB: 1971-01-26, 47 y.o.   MRN: [id number]   Acute Care Surgery Service Progress Note:    Chief Complaint/Subjective: Called to BS by nurse bc of large amount of clot in Rt gluteal wound and pt reqd transfusion over past day or so for low hgb.  On eliquis  Objective: Vital signs in last 24 hours: Temp:  [98.5 F (36.9 C)-100.5 F (38.1 C)] 98.8 F (37.1 C) (04/06 1306) Pulse Rate:  [101-114] 114 (04/06 1306) Resp:  [17-26] 20 (04/06 1306) BP: (98-142)/(52-75) 113/69 (04/06 1306) SpO2:  [93 %-99 %] 99 % (04/06 1306) Weight:  [121.7 kg (268 lb 4.8 oz)-128.4 kg (283 lb)] 121.7 kg (268 lb 4.8 oz) (04/06 1113) Last BM Date: 06/18/17  Intake/Output from previous day: 04/05 0701 - 04/06 0700 In: 1149 [P.O.:480; Blood:669] Out: 4 [Stool:4] Intake/Output this shift: Total I/O In: 315 [Blood:315] Out: 3000 [Other:3000]  See pictures below      Rt gluteal wound - old clot in wound, gauze saturated. Wound base/edges viable and look good. No necrotic tissue. No pulsatile bleeder. A few areas of just slow oozing  Sacral wound - some fibrinous exudate/mild necrotic tissue at base but otherwise no clot/bleeding  Lab Results: CBC  Recent Labs    06/18/17 0317 06/19/17 0251  WBC 30.9* 30.9*  HGB 6.6* 7.1*  HCT 20.5* 22.0*  PLT 452* 460*   BMET Recent Labs    06/18/17 0317 06/19/17 0251  NA 135 135  K 3.5 4.2  CL 97* 97*  CO2 24 22  GLUCOSE 116* 109*  BUN 16 24*  CREATININE 4.26* 5.51*  CALCIUM 7.7* 8.1*   LFT Hepatic Function Latest Ref Rng & Units 06/15/2017 06/14/2017 06/14/2017  Total Protein 6.5 - 8.1 g/dL 5.7(L) 5.4(L) 5.9(L)  Albumin 3.5 - 5.0 g/dL 1.7(L) 1.7(L) 1.8(L)  AST 15 - 41 U/L 23 33 17  ALT 17 - 63 U/L 16(L) 14(L) 10(L)  Alk Phosphatase 38 - 126 U/L 96 85 83  Total Bilirubin 0.3 - 1.2 mg/dL 0.8 1.1 0.7  Bilirubin, Direct 0.1 - 0.5 mg/dL - - -   PT/INR No results for input(s): LABPROT, INR in the last 72  hours. ABG No results for input(s): PHART, HCO3 in the last 72 hours.  Invalid input(s): PCO2, PO2  Studies/Results:  Anti-infectives: Anti-infectives (From admission, onward)   Start     Dose/Rate Route Frequency Ordered Stop   06/17/17 1200  cefTAZidime (FORTAZ) 2 g in sodium chloride 0.9 % 100 mL IVPB     2 g 200 mL/hr over 30 Minutes Intravenous Every T-Th-Sa (Hemodialysis) 06/16/17 1001     06/17/17 0837  vancomycin (VANCOCIN) 1-5 GM/200ML-% IVPB    Note to Pharmacy:  Louretta Shorten   : cabinet override      06/17/17 0837 06/17/17 1123   06/16/17 1100  cefTAZidime (FORTAZ) 2 g in sodium chloride 0.9 % 100 mL IVPB     2 g 200 mL/hr over 30 Minutes Intravenous  Once 06/16/17 1001 06/16/17 1137   06/15/17 1502  vancomycin (VANCOCIN) 1-5 GM/200ML-% IVPB    Note to Pharmacy:  Herriott, Melisa   : cabinet override      06/15/17 1502 06/15/17 1553   06/15/17 1200  vancomycin (VANCOCIN) IVPB 1000 mg/200 mL premix  Status:  Discontinued     1,000 mg 200 mL/hr over 60 Minutes Intravenous Every T-Th-Sa (Hemodialysis) 06/14/17 1256 06/17/17 1153   06/14/17 1330  vancomycin (VANCOCIN) 2,000 mg  in sodium chloride 0.9 % 500 mL IVPB     2,000 mg 250 mL/hr over 120 Minutes Intravenous  Once 06/14/17 1256 06/14/17 2229   06/14/17 1130  piperacillin-tazobactam (ZOSYN) IVPB 3.375 g  Status:  Discontinued     3.375 g 12.5 mL/hr over 240 Minutes Intravenous Every 12 hours 06/14/17 1057 06/16/17 0936      Medications: Scheduled Meds: . amiodarone  200 mg Oral Daily  . calcitRIOL  0.5 mcg Oral Q T,Th,Sat-1800  . darbepoetin (ARANESP) injection - DIALYSIS  150 mcg Intravenous Q Thu-HD  . insulin aspart  0-15 Units Subcutaneous TID WC  . insulin aspart  0-5 Units Subcutaneous QHS  . insulin aspart  5 Units Subcutaneous TID WC  . insulin glargine  25 Units Subcutaneous QHS  . predniSONE  30 mg Oral Q breakfast  . sodium chloride flush  3 mL Intravenous Q12H   Continuous Infusions: .  cefTAZidime (FORTAZ)  IV Stopped (06/19/17 1113)   PRN Meds:.acetaminophen **OR** acetaminophen, bisacodyl, HYDROcodone-acetaminophen, methocarbamol, naloxone, ondansetron **OR** ondansetron (ZOFRAN) IV, senna-docusate, traMADol  Assessment/Plan: Patient Active Problem List   Diagnosis Date Noted  . Sacral wound 06/08/2017  . Pressure injury of sacral region, unstageable ([place])   . Noncompliance   . Constipation   . Steroid-induced hyperglycemia   . HSV (herpes simplex virus) infection   . Neuropathic pain   . Labile blood pressure   . Labile blood glucose   . Carpal tunnel syndrome of left wrist   . Pain   . Leukocytosis   . PAF (paroxysmal atrial fibrillation) ([place])   . Acute on chronic diastolic (congestive) heart failure ([place])   . Type 2 diabetes mellitus with peripheral neuropathy (HCC)   . Acute blood loss anemia   . Anemia of chronic disease   . ESRD on dialysis ([place])   . Cardiorenal syndrome with renal failure   . Cardiorenal syndrome, stage 1-4 or unspecified chronic kidney disease, with heart failure ([place]) 05/06/2017  . Atrial fibrillation, chronic ([place]) 04/22/2017  . Hypoxia 04/22/2017  . Excessive daytime sleepiness 04/02/2017  . Debility 04/02/2017  . Bradycardia 03/28/2017  . CKD (chronic kidney disease) stage 4, GFR 15-29 ml/min (HCC) 03/28/2017  . Pseudogout 03/09/2017  . Rheumatoid arthritis ([place]) 03/09/2017  . Infected ulcer of skin, with fat layer exposed ([place]) 03/09/2017  . Normocytic anemia 03/06/2017  . Elevated troponin 03/06/2017  . Acute renal failure superimposed on stage 4 chronic kidney disease ([place]) 03/06/2017  . Essential hypertension 12/29/2016  . Persistent atrial fibrillation ([place])   . Left leg cellulitis   . Atrial fibrillation with RVR ([place]) 12/02/2016  . Sepsis ([place]) 12/02/2016  . Acute on chronic congestive heart failure ([place])   . Acute on chronic renal insufficiency   . Chronic diastolic CHF (congestive heart failure) ([place]) 03/20/2016   . Nonischemic cardiomyopathy ([place]) 11/11/2015  . Long term (current) use of anticoagulants [Z79.01] 08/14/2015  . Insulin-dependent diabetes mellitus with renal complications ([place]) 62/83/6629  . Atrial flutter with rapid ventricular response ([place]) 08/06/2015  . Hyponatremia 07/30/2015  . Acute kidney injury ([place]) 07/30/2015  . Hyperkalemia 07/30/2015  . Acute on chronic diastolic CHF (congestive heart failure) ([place]) 07/30/2015  . Morbid obesity ([place]) 07/30/2015  . Paroxysmal atrial fibrillation ([place]) 07/30/2015  . 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 Hypertensive heart disease with heart failure ([place]) 07/14/2012  . Dyspnea 07/14/2012  . Hyperlipidemia 07/14/2012   AF/DVT - on Eliquis DCHF/NICM ESRD HD - TTS Cardiorenal  syndrome Diabetes ID - peripheral neuropathy Hypertension RA - on steroids Deconditioning- cardiorenal syndrome Anemia   Sacral ulcer S/pDEBRIDMENT OF SACRAL DECUBITUS ULCER [id number] Dr. Melanee Spry - surgical path:DENSELY INFLAMED AND NECROTIC FIBROADIPOSE TISSUE,THERE IS NO EVIDENCE OF MALIGNANCY Right gluteal abscess S/pDebridement of 70cm^2 tissue involving subcutaneous tissue, fascia and muscle of right gluteal area, 06/14/17 Dr. Gurney Maxin - surgical path:DENSELY INFLAMED FIBROADIPOSE TISSUE WITH SKELETAL MUSCLE. - culture: MORGANELLA MORGANII - per ID will likely require at least 6 weeks of antibiotics with dialysis for the sacral osteomyelitis  ID -fortaz 4/3>>,Vancomycin4/1>>;Zosyn 4/1>>4/3;ancef perioperative3/22 FEN -renal diet VTE -eliquis Foley -none  Plan:bleeding from rt gluteal wound requiring transfusion. No active pulsatile bleeding. Just 1-2 areas of small/scant oozing. Probably effect of eliquis.   Don't think pt needs return trip to OR today for bleeding. Just small ooze. Will pack firm and hold off on dressing change for next 24hrs.   If becomes unstable or has  ongoing oozing will likely need cauterization in OR   Disposition:  LOS: 11 days    Leighton Ruff. Redmond Pulling, MD, FACS General, Bariatric, & Minimally Invasive Surgery [phone] [facility]

## 2017-06-19 NOTE — Progress Notes (Signed)
Paged Dr. Redmond Pulling concerning dressings to sacral and R gluteal wounds soiled with stool. Per MD ok for RN to changed packing ensuring firm packing to R gluteal wound due to previous bleed.

## 2017-06-20 DIAGNOSIS — Z992 Dependence on renal dialysis: Secondary | ICD-10-CM

## 2017-06-20 DIAGNOSIS — R197 Diarrhea, unspecified: Secondary | ICD-10-CM

## 2017-06-20 LAB — GLUCOSE, CAPILLARY
GLUCOSE-CAPILLARY: 212 mg/dL — AB (ref 65–99)
GLUCOSE-CAPILLARY: 202 mg/dL — AB (ref 65–99)
GLUCOSE-CAPILLARY: 167 mg/dL — AB (ref 65–99)
Glucose-Capillary: 142 mg/dL — ABNORMAL HIGH (ref 65–99)

## 2017-06-20 LAB — BPAM RBC
BLOOD PRODUCT EXPIRATION DATE: 201904082359
Blood Product Expiration Date: 201904122359
ISSUE DATE / TIME: 201904051147
ISSUE DATE / TIME: 201904060933
UNIT TYPE AND RH: 6200
Unit Type and Rh: 6200

## 2017-06-20 LAB — BASIC METABOLIC PANEL
ANION GAP: 17 — AB (ref 5–15)
BUN: 18 mg/dL (ref 6–20)
CHLORIDE: 94 mmol/L — AB (ref 101–111)
CO2: 22 mmol/L (ref 22–32)
Calcium: 7.9 mg/dL — ABNORMAL LOW (ref 8.9–10.3)
Creatinine, Ser: 4.21 mg/dL — ABNORMAL HIGH (ref 0.61–1.24)
GFR calc non Af Amer: 16 mL/min — ABNORMAL LOW (ref >60)
GFR, EST AFRICAN AMERICAN: 18 mL/min — AB (ref >60)
Glucose, Bld: 185 mg/dL — ABNORMAL HIGH (ref 65–99)
Potassium: 3.3 mmol/L — ABNORMAL LOW (ref 3.5–5.1)
SODIUM: 133 mmol/L — AB (ref 135–145)

## 2017-06-20 LAB — CBC WITH DIFFERENTIAL/PLATELET
BASOS PCT: 0 %
Basophils Absolute: 0 10*3/uL (ref 0.0–0.1)
EOS ABS: 0 10*3/uL (ref 0.0–0.7)
EOS PCT: 0 %
HEMATOCRIT: 23.3 % — AB (ref 39.0–52.0)
Hemoglobin: 7.3 g/dL — ABNORMAL LOW (ref 13.0–17.0)
LYMPHS PCT: 4 %
Lymphs Abs: 1.2 10*3/uL (ref 0.7–4.0)
MCH: 26.9 pg (ref 26.0–34.0)
MCHC: 31.3 g/dL (ref 30.0–36.0)
MCV: 86 fL (ref 78.0–100.0)
Monocytes Absolute: 0.9 10*3/uL (ref 0.1–1.0)
Monocytes Relative: 3 %
Neutro Abs: 27.1 10*3/uL — ABNORMAL HIGH (ref 1.7–7.7)
Neutrophils Relative %: 93 %
PLATELETS: 413 10*3/uL — AB (ref 150–400)
RBC: 2.71 MIL/uL — AB (ref 4.22–5.81)
RDW: 20 % — AB (ref 11.5–15.5)
WBC: 29.2 10*3/uL — AB (ref 4.0–10.5)

## 2017-06-20 LAB — TYPE AND SCREEN
ABO/RH(D): A POS
Antibody Screen: NEGATIVE
Unit division: 0
Unit division: 0

## 2017-06-20 LAB — MAGNESIUM: Magnesium: 1.9 mg/dL (ref 1.7–2.4)

## 2017-06-20 NOTE — Progress Notes (Signed)
MD notified of patient having 1st degree heart block with bundle branch and wide QRS of 0.14

## 2017-06-20 NOTE — Progress Notes (Signed)
[facility] Progress Note    Subjective:   Noted to have significant bleeding from gluteal abscess site   Objective:   BP 122/73 (BP Location: Right Arm)   Pulse 81   Temp (!) 97.5 F (36.4 C) (Oral)   Resp 18   Ht 6' (1.829 m)   Wt 122.9 kg (271 lb)   SpO2 98%   BMI 36.75 kg/m   Intake/Output: I/O last 3 completed shifts: In: 815 [P.O.:400; Blood:315; IV Piggyback:100] Out: 3001 [Other:3000; Stool:1]   Intake/Output this shift:  Total I/O In: 120 [P.O.:120] Out: -  Weight change: -6.668 kg (-14 lb 11.2 oz)  Physical Exam: Gen:NAD CVS: no rub Resp: cta Abd: +BS, soft, NT Ext: no edema, lavf +T/B  Labs: BMET Recent Labs  Lab 06/14/17 0752 06/14/17 1644 06/15/17 0240 06/16/17 0259 06/17/17 0351 06/18/17 0317 06/19/17 0251 06/20/17 0254  NA 134* 130* 131* 134* 135 135 135 133*  K 4.2 4.2 5.1 5.1 3.6 3.5 4.2 3.3*  CL 97* 95* 96* 96* 96* 97* 97* 94*  CO2 24 22 21* 22 25 24 22 22   GLUCOSE 94 127* 172* 152* 115* 116* 109* 185*  BUN 39* 43* 48* 28* 35* 16 24* 18  CREATININE 6.90* 7.00* 7.53* 4.85* 6.25* 4.26* 5.51* 4.21*  ALBUMIN 1.8* 1.7* 1.7*  --   --   --   --   --   CALCIUM 8.2* 8.0* 8.4* 7.9* 7.8* 7.7* 8.1* 7.9*   CBC Recent Labs  Lab 06/17/17 0351 06/18/17 0317 06/19/17 0251 06/19/17 1647 06/20/17 0254  WBC 18.4* 30.9* 30.9* 31.5* 29.2*  NEUTROABS 15.2* 26.5* 28.5*  --  27.1*  HGB 7.8* 6.6* 7.1* 7.3* 7.3*  HCT 25.4* 20.5* 22.0* 23.5* 23.3*  MCV 87.3 90.3 88.7 85.8 86.0  PLT 521* 452* 460* 427* 413*    @IMGRELPRIORS @ Medications:    . amiodarone  200 mg Oral Daily  . calcitRIOL  0.5 mcg Oral Q T,Th,Sat-1800  . darbepoetin (ARANESP) injection - DIALYSIS  150 mcg Intravenous Q Thu-HD  . insulin aspart  0-15 Units Subcutaneous TID WC  . insulin aspart  0-5 Units Subcutaneous QHS  . insulin aspart  5 Units Subcutaneous TID WC  . insulin glargine  25 Units Subcutaneous QHS  . predniSONE  30 mg Oral Q breakfast  . sodium  chloride flush  3 mL Intravenous Q12H     Assessment/ Plan:   1. ABLA from gluteal abscess site- s/p blood transfusions and Eliquis on hold 2. Fever- temp of 102 yesterday, cultures pending, may need to have HD catheter removed and cath holiday.  abx per primary svc 3. AMSdue to gluteal abscess and early sepsis-s/p I&D of abscess with marked improvement in BP and mentation. He is more arousable today. Appreciate surgery's assistance.  4. ESRDcontinue with HD q TTS while he remains an inpatient.  5. Anemia:on ESAnow with drop in hgb post op, s/p transfusion yesterday and will give another today with HD tomorrow. 6. CKD-MBD:cont with vit D will need binders and to follow phos when able to take po 7. Nutrition:renal diet 8. Hypertension:stable and continue with UF. 9. Vascular access- LAVF placed 05/10/17, RIJ tdc. Question maturation of AVF. Will need to f/u with VVS. 10. Decubitus ulcers- s/p debridement 06/04/17. Continue with wound care 11. A fib- per primary 12. Rheumatoid arthritis flare- on prednisone taper. 13. Deconditioning- continue with PT/OT     Donetta Potts, MD [place] Pager [phone] 06/20/2017, 12:06 PM

## 2017-06-20 NOTE — Progress Notes (Addendum)
INFECTIOUS DISEASE PROGRESS NOTE  ID: John Bailey is a 47 y.o. 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)   Nonischemic cardiomyopathy (HCC)   Chronic diastolic CHF (congestive heart failure) (HCC)   Cardiorenal syndrome, stage 1-4 or unspecified chronic kidney disease, with heart failure (HCC)   ESRD on dialysis ([place])   HSV (herpes simplex virus) infection   Neuropathic pain   Pressure injury of sacral region, unstageable ([place])  Subjective: No complaints Would like air boots off Having diarrhea  Abtx:  Anti-infectives (From admission, onward)   Start     Dose/Rate Route Frequency Ordered Stop   06/17/17 1200  cefTAZidime (FORTAZ) 2 g in sodium chloride 0.9 % 100 mL IVPB     2 g 200 mL/hr over 30 Minutes Intravenous Every T-Th-Sa (Hemodialysis) 06/16/17 1001     06/17/17 0837  vancomycin (VANCOCIN) 1-5 GM/200ML-% IVPB    Note to Pharmacy:  Louretta Shorten   : cabinet override      06/17/17 0837 06/17/17 1123   06/16/17 1100  cefTAZidime (FORTAZ) 2 g in sodium chloride 0.9 % 100 mL IVPB     2 g 200 mL/hr over 30 Minutes Intravenous  Once 06/16/17 1001 06/16/17 1137   06/15/17 1502  vancomycin (VANCOCIN) 1-5 GM/200ML-% IVPB    Note to Pharmacy:  Herriott, Melisa   : cabinet override      06/15/17 1502 06/15/17 1553   06/15/17 1200  vancomycin (VANCOCIN) IVPB 1000 mg/200 mL premix  Status:  Discontinued     1,000 mg 200 mL/hr over 60 Minutes Intravenous Every T-Th-Sa (Hemodialysis) 06/14/17 1256 06/17/17 1153   06/14/17 1330  vancomycin (VANCOCIN) 2,000 mg in sodium chloride 0.9 % 500 mL IVPB     2,000 mg 250 mL/hr over 120 Minutes Intravenous  Once 06/14/17 1256 06/14/17 2229   06/14/17 1130   piperacillin-tazobactam (ZOSYN) IVPB 3.375 g  Status:  Discontinued     3.375 g 12.5 mL/hr over 240 Minutes Intravenous Every 12 hours 06/14/17 1057 06/16/17 0936      Medications:  Scheduled: . amiodarone  200 mg Oral Daily  . calcitRIOL  0.5 mcg Oral Q T,Th,Sat-1800  . darbepoetin (ARANESP) injection - DIALYSIS  150 mcg Intravenous Q Thu-HD  . insulin aspart  0-15 Units Subcutaneous TID WC  . insulin aspart  0-5 Units Subcutaneous QHS  . insulin aspart  5 Units Subcutaneous TID WC  . insulin glargine  25 Units Subcutaneous QHS  . predniSONE  30 mg Oral Q breakfast  . sodium chloride flush  3 mL Intravenous Q12H    Objective: Vital signs in last 24 hours: Temp:  [97.5 F (36.4 C)-102 F (38.9 C)] 97.5 F (36.4 C) (04/07 0510) Pulse Rate:  [81-107] 81 (04/07 0510) Resp:  [18-22] 18 (04/07 0510) BP: (99-122)/(50-73) 122/73 (04/07 0510) SpO2:  [97 %-98 %] 98 % (04/07 0510) Weight:  [122.9 kg (271 lb)] 122.9 kg (271 lb) (04/07 0500)   General appearance: alert, cooperative and no distress Resp: clear to auscultation bilaterally Chest wall: no tenderness, R chest HD line no d/c or tenderness.  Cardio: regular rate and rhythm GI: normal findings: bowel sounds normal and soft, non-tender Extremities: edema none. no cordis. non-tender. and LUE fistula, +Bruit, +thrill.  Lab Results Recent Labs    06/19/17 0251 06/19/17 1647 06/20/17 0254  WBC 30.9* 31.5* 29.2*  HGB 7.1* 7.3* 7.3*  HCT 22.0* 23.5* 23.3*  NA 135  --  133*  K 4.2  --  3.3*  CL 97*  --  94*  CO2 22  --  22  BUN 24*  --  18  CREATININE 5.51*  --  4.21*   Liver Panel No results for input(s): PROT, ALBUMIN, AST, ALT, ALKPHOS, BILITOT, BILIDIR, IBILI in the last 72 hours. Sedimentation Rate No results for input(s): ESRSEDRATE in the last 72 hours. C-Reactive Protein No results for input(s): CRP in the last 72 hours.  Microbiology: Recent Results (from the past 240 hour(s))  Culture, blood (routine x  2)     Status: None   Collection Time: 06/13/17 11:26 AM  Result Value Ref Range Status   Specimen Description BLOOD RIGHT HAND  Final   Special Requests   Final    IN BOTH AEROBIC AND ANAEROBIC BOTTLES Blood Culture adequate volume   Culture   Final    NO GROWTH 5 DAYS Performed at [facility], [address] [place], [place] [address]    Report Status 06/18/2017 FINAL  Final  Culture, blood (routine x 2)     Status: Abnormal   Collection Time: 06/13/17 11:26 AM  Result Value Ref Range Status   Specimen Description BLOOD RIGHT HAND  Final   Special Requests   Final    IN BOTH AEROBIC AND ANAEROBIC BOTTLES Blood Culture adequate volume   Culture  Setup Time   Final    GRAM POSITIVE COCCI ANAEROBIC BOTTLE ONLY CRITICAL RESULT CALLED TO, READ BACK BY AND VERIFIED WITH: Karlene Einstein PharmD 14:40 06/14/17 ([email or web address])    Culture (A)  Final    STAPHYLOCOCCUS SPECIES (COAGULASE NEGATIVE) THE SIGNIFICANCE OF ISOLATING THIS ORGANISM FROM A SINGLE SET OF BLOOD CULTURES WHEN MULTIPLE SETS ARE DRAWN IS UNCERTAIN. PLEASE NOTIFY THE MICROBIOLOGY DEPARTMENT WITHIN ONE WEEK IF SPECIATION AND SENSITIVITIES ARE REQUIRED. Performed at [facility], [place] [address], [place], [place] [address]    Report Status 06/16/2017 FINAL  Final  Blood Culture ID Panel (Reflexed)     Status: Abnormal   Collection Time: 06/13/17 11:26 AM  Result Value Ref Range Status   Enterococcus species NOT DETECTED NOT DETECTED Final   Listeria monocytogenes NOT DETECTED NOT DETECTED Final   Staphylococcus species DETECTED (A) NOT DETECTED Final    Comment: Methicillin (oxacillin) resistant coagulase negative staphylococcus. Possible blood culture contaminant (unless isolated from more than one blood culture draw or clinical case suggests pathogenicity). No antibiotic treatment is indicated for blood  culture contaminants. CRITICAL RESULT CALLED TO, READ BACK BY AND VERIFIED WITH: Karlene Einstein PharmD 14:40 06/14/17  ([email or web address])    Staphylococcus aureus NOT DETECTED NOT DETECTED Final   Methicillin resistance DETECTED (A) NOT DETECTED Final    Comment: CRITICAL RESULT CALLED TO, READ BACK BY AND VERIFIED WITH: Karlene Einstein PharmD 14:40 06/14/17 ([email or web address])    Streptococcus species NOT DETECTED NOT DETECTED Final   Streptococcus agalactiae NOT DETECTED NOT DETECTED Final   Streptococcus pneumoniae NOT DETECTED NOT DETECTED Final   Streptococcus pyogenes NOT DETECTED NOT DETECTED Final   Acinetobacter baumannii NOT DETECTED NOT DETECTED Final   Enterobacteriaceae species NOT DETECTED NOT DETECTED Final   Enterobacter cloacae complex NOT DETECTED NOT DETECTED Final   Escherichia coli NOT DETECTED NOT DETECTED Final   Klebsiella oxytoca NOT DETECTED NOT DETECTED  Final   Klebsiella pneumoniae NOT DETECTED NOT DETECTED Final   Proteus species NOT DETECTED NOT DETECTED Final   Serratia marcescens NOT DETECTED NOT DETECTED Final   Haemophilus influenzae NOT DETECTED NOT DETECTED Final   Neisseria meningitidis NOT DETECTED NOT DETECTED Final   Pseudomonas aeruginosa NOT DETECTED NOT DETECTED Final   Candida albicans NOT DETECTED NOT DETECTED Final   Candida glabrata NOT DETECTED NOT DETECTED Final   Candida krusei NOT DETECTED NOT DETECTED Final   Candida parapsilosis NOT DETECTED NOT DETECTED Final   Candida tropicalis NOT DETECTED NOT DETECTED Final    Comment: Performed at [facility], [place] [address] [place], [place] [address]  Aerobic/Anaerobic Culture (surgical/deep wound)     Status: None   Collection Time: 06/14/17  7:52 PM  Result Value Ref Range Status   Specimen Description ABSCESS GLUTEAL  Final   Special Requests PATIENT ON FOLLOWING ZOSYN AND VANC  Final   Gram Stain   Final    FEW WBC PRESENT, PREDOMINANTLY PMN FEW GRAM POSITIVE COCCI FEW GRAM NEGATIVE RODS RARE GRAM POSITIVE RODS Performed at [facility], [place] [address], [place], [place] [address]    Culture   Final    FEW  MORGANELLA MORGANII MIXED ANAEROBIC FLORA PRESENT.  CALL LAB IF FURTHER IID REQUIRED.    Report Status 06/17/2017 FINAL  Final   Organism ID, Bacteria MORGANELLA MORGANII  Final      Susceptibility   Morganella morganii - MIC*    AMPICILLIN >=32 RESISTANT Resistant     CEFAZOLIN >=64 RESISTANT Resistant     CEFEPIME <=1 SENSITIVE Sensitive     CEFTAZIDIME <=1 SENSITIVE Sensitive     CEFTRIAXONE <=1 SENSITIVE Sensitive     CIPROFLOXACIN <=0.25 SENSITIVE Sensitive     GENTAMICIN <=1 SENSITIVE Sensitive     IMIPENEM 1 SENSITIVE Sensitive     TRIMETH/SULFA <=20 SENSITIVE Sensitive     AMPICILLIN/SULBACTAM 16 INTERMEDIATE Intermediate     PIP/TAZO <=4 SENSITIVE Sensitive     * FEW MORGANELLA MORGANII  MRSA PCR Screening     Status: None   Collection Time: 06/15/17 12:48 AM  Result Value Ref Range Status   MRSA by PCR NEGATIVE NEGATIVE Final    Comment:        The GeneXpert MRSA Assay (FDA approved for NASAL specimens only), is one component of a comprehensive MRSA colonization surveillance program. It is not intended to diagnose MRSA infection nor to guide or monitor treatment for MRSA infections. Performed at [facility], [place] [address] [place], [place] [address]   Culture, blood (routine x 2)     Status: None (Preliminary result)   Collection Time: 06/16/17 10:01 AM  Result Value Ref Range Status   Specimen Description BLOOD RIGHT ANTECUBITAL  Final   Special Requests   Final    BOTTLES DRAWN AEROBIC AND ANAEROBIC Blood Culture results may not be optimal due to an excessive volume of blood received in culture bottles   Culture   Final    NO GROWTH 3 DAYS Performed at [facility], [place] [address], [place],  [address]    Report Status PENDING  Incomplete  Culture, blood (routine x 2)     Status: None (Preliminary result)   Collection Time: 06/16/17 10:01 AM  Result Value Ref Range Status   Specimen Description BLOOD RIGHT HAND  Final   Special  Requests   Final    BOTTLES DRAWN AEROBIC AND ANAEROBIC  Blood Culture results may not be optimal due to an inadequate volume of blood received in culture bottles   Culture   Final    NO GROWTH 3 DAYS Performed at [facility], [place] [address], [place],  [address]    Report Status PENDING  Incomplete    Studies/Results: No results found.   Assessment/Plan: Sacral decubitus   Wound Cx M morgagni Osteomyelitis ESRD Fever HD access diarrhea  Total days of antibiotics: 6/42 (ceftaz)  Will check C diff Consider remove HD line as fever source? His HD fistula is ~ 43 month old, not clear if mature enough... May have to change anbx in consideration of drug fever? He wants his air boots off. Defer to primary, seems reasonable, he is on air mattress.          Bobby Rumpf MD, FACP Infectious Diseases (pager) [phone] [email or web address] 06/20/2017, 1:12 PM  LOS: 12 days

## 2017-06-20 NOTE — Progress Notes (Signed)
[facility] Progress Note  6 Days Post-Op  Subjective: CC-  Patient received 1 uPRBC yesterday and 1 unit on 4/5 due to anemia. Hemoglobin dropped to 6.6 on 4/5, it has been stable at 7.3 yesterday and this morning. Tachycardia resolved. eliquis is being held (last dose 4/6).  No complaints this morning. States that he is feeling better now that he is avoiding tramadol. Continues to have some pain in his sacrum but it is somewhat better. Got OOB to chair with hoyer lift yesterday. Patient did have a temp of 102 yesterday, primary team considering HD catheter removal and line holiday.  Objective: Vital signs in last 24 hours: Temp:  [97.5 F (36.4 C)-102 F (38.9 C)] 97.5 F (36.4 C) (04/07 0510) Pulse Rate:  [81-114] 81 (04/07 0510) Resp:  [18-26] 18 (04/07 0510) BP: (99-127)/(50-73) 122/73 (04/07 0510) SpO2:  [97 %-99 %] 98 % (04/07 0510) Weight:  [268 lb 4.8 oz (121.7 kg)-271 lb (122.9 kg)] 271 lb (122.9 kg) (04/07 0500) Last BM Date: 06/18/17  Intake/Output from previous day: 04/06 0701 - 04/07 0700 In: 815 [P.O.:400; Blood:315; IV Piggyback:100] Out: 3000  Intake/Output this shift: Total I/O In: 120 [P.O.:120] Out: -   PE: Gen:  Alert, NAD, pleasant HEENT: EOM's intact, pupils equal and round Card:  RRR Pulm:  CTAB, no W/R/R, effort normal Abd: obese, soft, NT/ND, +BS, no HSM, no hernia GU: dry dressing to sacral and right gluteal wounds  Lab Results:  Recent Labs    06/19/17 1647 06/20/17 0254  WBC 31.5* 29.2*  HGB 7.3* 7.3*  HCT 23.5* 23.3*  PLT 427* 413*   BMET Recent Labs    06/19/17 0251 06/20/17 0254  NA 135 133*  K 4.2 3.3*  CL 97* 94*  CO2 22 22  GLUCOSE 109* 185*  BUN 24* 18  CREATININE 5.51* 4.21*  CALCIUM 8.1* 7.9*   PT/INR No results for input(s): LABPROT, INR in the last 72 hours. CMP     Component Value Date/Time   NA 133 (L) 06/20/2017 0254   K 3.3 (L) 06/20/2017 0254   CL 94 (L) 06/20/2017 0254   CO2 22  06/20/2017 0254   GLUCOSE 185 (H) 06/20/2017 0254   BUN 18 06/20/2017 0254   CREATININE 4.21 (H) 06/20/2017 0254   CALCIUM 7.9 (L) 06/20/2017 0254   PROT 5.7 (L) 06/15/2017 0240   ALBUMIN 1.7 (L) 06/15/2017 0240   AST 23 06/15/2017 0240   ALT 16 (L) 06/15/2017 0240   ALKPHOS 96 06/15/2017 0240   BILITOT 0.8 06/15/2017 0240   GFRNONAA 16 (L) 06/20/2017 0254   GFRAA 18 (L) 06/20/2017 0254   Lipase  No results found for: LIPASE     Studies/Results: No results found.  Anti-infectives: Anti-infectives (From admission, onward)   Start     Dose/Rate Route Frequency Ordered Stop   06/17/17 1200  cefTAZidime (FORTAZ) 2 g in sodium chloride 0.9 % 100 mL IVPB     2 g 200 mL/hr over 30 Minutes Intravenous Every T-Th-Sa (Hemodialysis) 06/16/17 1001     06/17/17 0837  vancomycin (VANCOCIN) 1-5 GM/200ML-% IVPB    Note to Pharmacy:  Louretta Shorten   : cabinet override      06/17/17 0837 06/17/17 1123   06/16/17 1100  cefTAZidime (FORTAZ) 2 g in sodium chloride 0.9 % 100 mL IVPB     2 g 200 mL/hr over 30 Minutes Intravenous  Once 06/16/17 1001 06/16/17 1137   06/15/17 1502  vancomycin (VANCOCIN) 1-5 GM/200ML-%  IVPB    Note to Pharmacy:  Leatrice Jewels   : cabinet override      06/15/17 1502 06/15/17 1553   06/15/17 1200  vancomycin (VANCOCIN) IVPB 1000 mg/200 mL premix  Status:  Discontinued     1,000 mg 200 mL/hr over 60 Minutes Intravenous Every T-Th-Sa (Hemodialysis) 06/14/17 1256 06/17/17 1153   06/14/17 1330  vancomycin (VANCOCIN) 2,000 mg in sodium chloride 0.9 % 500 mL IVPB     2,000 mg 250 mL/hr over 120 Minutes Intravenous  Once 06/14/17 1256 06/14/17 2229   06/14/17 1130  piperacillin-tazobactam (ZOSYN) IVPB 3.375 g  Status:  Discontinued     3.375 g 12.5 mL/hr over 240 Minutes Intravenous Every 12 hours 06/14/17 1057 06/16/17 0936       Assessment/Plan AF/DVT - on Eliquis DCHF/NICM ESRD HD - TTS Cardiorenal syndrome Diabetes ID - peripheral  neuropathy Hypertension RA - on steroids Deconditioning- cardiorenal syndrome Anemia- received 1 uPRBC 4/5 and 1 unit 4/6, holding eliquis, hg now stable at 7.3   Sacral ulcer S/pDEBRIDMENT OF SACRAL DECUBITUS ULCER [id number] Dr. Melanee Spry - surgical path:DENSELY INFLAMED AND NECROTIC FIBROADIPOSE TISSUE,THERE IS NO EVIDENCE OF MALIGNANCY Right gluteal abscess S/pDebridement of 70cm^2 tissue involving subcutaneous tissue, fascia and muscle of right gluteal area, 06/14/17 Dr. Gurney Maxin - surgical path:DENSELY INFLAMED FIBROADIPOSE TISSUE WITH SKELETAL MUSCLE. - culture: MORGANELLA MORGANII - per ID will likely require at least 6 weeks of antibiotics with dialysis for the sacral osteomyelitis  ID -fortaz 4/3>>,Vancomycin4/1>>4/4;Zosyn 4/1>>4/3;ancef perioperative3/22 FEN -renal diet VTE -SCDs, holding eliquis due to ABL anemia Foley -none  Plan: Bleeding from right gluteal wound requiring transfusion 4/5 and 4/6, keep packing in place and only change if it becomes soiled. Hemoglobin stable at 7.3 this morning, BP and HR stable. Continue to hold eliquis. Follow H&H. Will reevaluate wound tomorrow; may want to have some surgicel at bedside during dressing change.   LOS: 12 days    Wellington Hampshire , [facility] 06/20/2017, 9:39 AM Pager: [phone] Consults: [phone] Mon-Fri 7:00 am-4:30 pm Sat-Sun 7:00 am-11:30 am

## 2017-06-20 NOTE — Progress Notes (Signed)
Patient ID: John Bailey, male   DOB: 04-28-1970, 47 y.o.   MRN: [id number]  PROGRESS NOTE    John Bailey  TMH:[id number] DOB: [birth year] DOA: 06/08/2017 PCP: [name], MD   Brief Narrative:  47 year old male with history of RA, PVD, hypertension, end-stage renal disease on hemodialysis, A. fib on Eliquis, diabetes and chronic diastolic CHF and sacral and coccyx pressure ulcer who was admitted from 04/22/2017-05/13/2017 with anasarca from acute on chronic diastolic heart failure and A. fib with RVR (cardioverted) and was discharged to inpatient rehab for debility.  He underwent debridement of sacral decubitus ulcer on 06/04/2017 by general surgery.  He was supposed to be discharged from [facility] on 06/09/2017 but general surgery recommended continued inpatient wound care so patient was readmitted to [facility] on 06/09/2017.  He required debridement of the right gluteal abscess on 06/14/2017 by general surgery.  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  One set of blood culture 3/31 grew coagulase-negative staph, probably a contaminant.  Second set of blood culture 3/31, negative and final report.  Blood cultures x2 from 4/3: Negative to  date. 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  Requested ID/Dr. Johnnye Sima to see today.  Await follow-up regarding further management.? HD catheter removal and line holiday. -Noted excessive bleeding from wound site on 4/6.  Surgeons packed wound at bedside.  As discussed with surgery, temporarily holding Eliquis (last dose 4/6 morning) until reassessment and advise by them.  Bleeding controlled now.  Acute metabolic encephalopathy -Probably secondary to above. Resolved.  CT of the head was negative for acute abnormality  Unstageable Sacral ulcer status post debridement -Debridement was done by Dr. Grandville Silos on 06/04/2017.   -General surgery following.  -Hydrotherapy will be resumed from today as per surgery recommendations.  Dressing changes as per general surgery. -Antibiotic management as indicated above. -This wound was not bleeding during exam 4/6.  Some slough noted at the base.  Pictures as below.  ESRD on HD  -Nephrology following.  Dialysis as per nephrology schedule.  Underwent HD on 4/6.  Patient was seen this morning at HD and then again at bedside later in the afternoon.  He received 1 unit PRBC across HD 4/6.  Hypertension - Coreg on hold due to soft blood pressures earlier on.  Stable now.  Type II Diabetes  -Continue current dose of Levemir along with NovoLog with meals.  CBGs starting to increase, likely related to prednisone started for RA.  May need to adjust insulins.  Leukocytosis, likely reactive, related to underlying sacral wound and  steroids in the setting of RA   -Leukocytosis has worsened from 18-30, likely due to hydrotherapy.  Antibiotics as above.  No change in leukocytosis.  Anemia of chronic disease  and  acute blood loss anemia. -Hemoglobin dropped to 6.6 on 4/5.  Transfuse 1 unit PRBC on 4/5 and second unit at dialysis on 4/6.  Hemoglobin stable in the 7.3 since yesterday.  Bleeding has reduced.  Follow CBC in a.m.  Atrial Fibrillation CHA2DS2-VASc score 2-3 , was on anticoagulation with Eliquis. Echo 04/25/17  EF 63-01, normal systolic  -Status post recent cardioversion during last hospitalization.  Currently rate controlled Continue amiodarone.  Hold Coreg.   Eliquis held 4/6 due to bleeding from wound.  Resume when cleared by general surgery.  Chronic diastolic heart failure.   -Recent hospitalization for acute decompensated heart failure. -Currently compensated and volume is managed by dialysis.  Hold Coreg.  Outpatient follow-up with cardiology  Rheumatoid arthritis with flare Completed prednisone taper which was started on 05/19/2017. outpatient follow-up with rheumatology.  Patient had been receiving abatacept every Tuesday prior to admission; will hold it for now. It appears that patient completed prednisone taper on 06/12/17.  On 4/6, patient complains of diffuse joint pains consistent with prior RA flare.  Started prednisone at 30 mg daily.  Pain better.  Deconditioning  -Continue PT/OT  Morbid obesity -Outpatient follow-up  DVT prophylaxis:  Eliquis> held on 4/6.  SCDs. Code Status:    Full  Family Communication:   Discussed in detail with patient's brother at bedside. Disposition Plan: SNF vs LTAC once clinically improves  Consultants: General surgery/nephrology/ID  Procedures: None  Antimicrobials: Zosyn from 06/14/2017-06/15/17 Vancomycin from 06/14/2017 onwards-discontinued 4/5. Ceftazidime from 06/16/2017 onwards   Subjective: As per discussion with nursing and chart review, bleeding from right gluteal wound has stopped.  Patient states that his pain in the hand joints and knees has improved.  Denies any other complaints.  Did spike a fever of 102 F  overnight.  Objective: Vitals:   06/19/17 1900 06/20/17 0040 06/20/17 0500 06/20/17 0510  BP: (!) 99/50 (!) 111/58  122/73  Pulse: (!) 107 84  81  Resp: (!) 22   18  Temp: (!) 102 F (38.9 C) 98.2 F (36.8 C)  (!) 97.5 F (36.4 C)  TempSrc: Oral Oral  Oral  SpO2: 97%   98%  Weight:   122.9 kg (271 lb)   Height:        Intake/Output Summary (Last 24 hours) at 06/20/2017 1326 Last data filed at 06/20/2017 6010 Gross per 24 hour  Intake 520 ml  Output 0 ml  Net 520 ml   Filed Weights   06/19/17 0700 06/19/17 1113 06/20/17 0500  Weight: 124.7 kg (275 lb) 121.7 kg (268 lb 4.8 oz) 122.9 kg (271 lb)    Examination:  General exam: Pleasant young male, moderately built and nourished lying comfortably propped up in bed.  Does not appear in any distress.  Getting ready to eat breakfast this morning. respiratory system: Clear to auscultation.  No increased work of breathing. cardiovascular system: S1 and S2 heard, RRR.  No JVD, murmurs or pedal edema. gastrointestinal system: Abdomen is obese, nondistended, soft and nontender. Normal bowel sounds heard.  Stable. Extremities: Moves all limbs symmetrically.  Slight warmth, mild tenderness and painful range of movements of small joints of his hands, bilateral wrists and knees.  Slightly better today. CNS: Alert and oriented.  No focal neurological deficits. Skin: Large right gluteal wound status post I&D  with bleeding but appears clean.  As per CCS, no splitting vessels.  Wound was packed with gauze.  Large mid sacral wound with some slough at the base but otherwise clean and no bleeding.  Please see pictures below taken 4/6.  This was as examined on 4/6.   Right gluteal wound post I&D.  06/19/17.   Mid sacral wound.  06/19/17.   Data Reviewed: I have personally reviewed following labs and imaging studies  CBC: Recent Labs  Lab 06/16/17 0259 06/17/17 0351 06/18/17 0317 06/19/17 0251 06/19/17 1647 06/20/17 0254  WBC 21.4* 18.4*  30.9* 30.9* 31.5* 29.2*  NEUTROABS 18.9* 15.2* 26.5* 28.5*  --  27.1*  HGB 7.4* 7.8* 6.6* 7.1* 7.3* 7.3*  HCT 24.0* 25.4* 20.5* 22.0* 23.5* 23.3*  MCV 86.3 87.3 90.3 88.7 85.8 86.0  PLT 409* 521* 452* 460* 427* 409*   Basic Metabolic Panel: Recent Labs  Lab 06/16/17 0259 06/17/17 0351 06/18/17 0317 06/19/17 0251 06/20/17 0254  NA 134* 135 135 135 133*  K 5.1 3.6 3.5 4.2 3.3*  CL 96* 96* 97* 97* 94*  CO2 22 25 24 22 22   GLUCOSE 152* 115* 116* 109* 185*  BUN 28* 35* 16 24* 18  CREATININE 4.85* 6.25* 4.26* 5.51* 4.21*  CALCIUM 7.9* 7.8* 7.7* 8.1* 7.9*  MG 1.9 1.9 1.8 1.9 1.9   GFR: Estimated Creatinine Clearance: 30.3 mL/min (A) (by C-G formula based on SCr of 4.21 mg/dL (H)). Liver Function Tests: Recent Labs  Lab 06/14/17 0752 06/14/17 1644 06/15/17 0240  AST 17 33 23  ALT 10* 14* 16*  ALKPHOS 83 85 96  BILITOT 0.7 1.1 0.8  PROT 5.9* 5.4* 5.7*  ALBUMIN 1.8* 1.7* 1.7*    Recent Labs  Lab 06/14/17 1644  AMMONIA 23   CBG: Recent Labs  Lab 06/19/17 1223 06/19/17 1727 06/19/17 2045 06/20/17 0758 06/20/17 1225  GLUCAP 99 114* 139* 212* 202*   Sepsis Labs: Recent Labs  Lab 06/14/17 1644  LATICACIDVEN 1.8    Recent Results (from the past 240 hour(s))  Culture, blood (routine x 2)     Status: None   Collection Time: 06/13/17 11:26 AM  Result Value Ref Range Status   Specimen Description BLOOD RIGHT HAND  Final   Special Requests   Final    IN BOTH AEROBIC AND ANAEROBIC BOTTLES Blood Culture adequate volume   Culture   Final    NO GROWTH 5 DAYS Performed at [facility], [place] [address] [place], [place] [address]    Report Status 06/18/2017 FINAL  Final  Culture, blood (routine x 2)     Status: Abnormal   Collection Time: 06/13/17 11:26 AM  Result Value Ref Range Status   Specimen Description BLOOD RIGHT HAND  Final   Special Requests   Final    IN BOTH AEROBIC AND ANAEROBIC BOTTLES Blood Culture adequate volume   Culture  Setup Time   Final     GRAM POSITIVE COCCI ANAEROBIC BOTTLE ONLY CRITICAL RESULT CALLED TO, READ BACK BY AND VERIFIED WITH: Karlene Einstein PharmD 14:40 06/14/17 ([email or web address])    Culture (A)  Final    STAPHYLOCOCCUS SPECIES (COAGULASE NEGATIVE) THE SIGNIFICANCE OF ISOLATING THIS ORGANISM FROM A SINGLE SET OF BLOOD CULTURES WHEN MULTIPLE SETS ARE DRAWN IS UNCERTAIN. PLEASE NOTIFY THE MICROBIOLOGY DEPARTMENT WITHIN ONE WEEK IF SPECIATION AND SENSITIVITIES ARE REQUIRED. Performed at [facility], [place] [address], [place], [place] [address]    Report Status 06/16/2017 FINAL  Final  Blood  Culture ID Panel (Reflexed)     Status: Abnormal   Collection Time: 06/13/17 11:26 AM  Result Value Ref Range Status   Enterococcus species NOT DETECTED NOT DETECTED Final   Listeria monocytogenes NOT DETECTED NOT DETECTED Final   Staphylococcus species DETECTED (A) NOT DETECTED Final    Comment: Methicillin (oxacillin) resistant coagulase negative staphylococcus. Possible blood culture contaminant (unless isolated from more than one blood culture draw or clinical case suggests pathogenicity). No antibiotic treatment is indicated for blood  culture contaminants. CRITICAL RESULT CALLED TO, READ BACK BY AND VERIFIED WITH: Karlene Einstein PharmD 14:40 06/14/17 ([email or web address])    Staphylococcus aureus NOT DETECTED NOT DETECTED Final   Methicillin resistance DETECTED (A) NOT DETECTED Final    Comment: CRITICAL RESULT CALLED TO, READ BACK BY AND VERIFIED WITH: Karlene Einstein PharmD 14:40 06/14/17 ([email or web address])    Streptococcus species NOT DETECTED NOT DETECTED Final   Streptococcus agalactiae NOT DETECTED NOT DETECTED Final   Streptococcus pneumoniae NOT DETECTED NOT DETECTED Final   Streptococcus pyogenes NOT DETECTED NOT DETECTED Final   Acinetobacter baumannii NOT DETECTED NOT DETECTED Final   Enterobacteriaceae species NOT DETECTED NOT DETECTED Final   Enterobacter cloacae complex NOT DETECTED NOT DETECTED Final   Escherichia coli NOT DETECTED  NOT DETECTED Final   Klebsiella oxytoca NOT DETECTED NOT DETECTED Final   Klebsiella pneumoniae NOT DETECTED NOT DETECTED Final   Proteus species NOT DETECTED NOT DETECTED Final   Serratia marcescens NOT DETECTED NOT DETECTED Final   Haemophilus influenzae NOT DETECTED NOT DETECTED Final   Neisseria meningitidis NOT DETECTED NOT DETECTED Final   Pseudomonas aeruginosa NOT DETECTED NOT DETECTED Final   Candida albicans NOT DETECTED NOT DETECTED Final   Candida glabrata NOT DETECTED NOT DETECTED Final   Candida krusei NOT DETECTED NOT DETECTED Final   Candida parapsilosis NOT DETECTED NOT DETECTED Final   Candida tropicalis NOT DETECTED NOT DETECTED Final    Comment: Performed at [facility], [place]. [address], [place], [place] [address]  Aerobic/Anaerobic Culture (surgical/deep wound)     Status: None   Collection Time: 06/14/17  7:52 PM  Result Value Ref Range Status   Specimen Description ABSCESS GLUTEAL  Final   Special Requests PATIENT ON FOLLOWING ZOSYN AND VANC  Final   Gram Stain   Final    FEW WBC PRESENT, PREDOMINANTLY PMN FEW GRAM POSITIVE COCCI FEW GRAM NEGATIVE RODS RARE GRAM POSITIVE RODS Performed at [facility], [place] [address] [place], [place] [address]    Culture   Final    FEW MORGANELLA MORGANII MIXED ANAEROBIC FLORA PRESENT.  CALL LAB IF FURTHER IID REQUIRED.    Report Status 06/17/2017 FINAL  Final   Organism ID, Bacteria MORGANELLA MORGANII  Final      Susceptibility   Morganella morganii - MIC*    AMPICILLIN >=32 RESISTANT Resistant     CEFAZOLIN >=64 RESISTANT Resistant     CEFEPIME <=1 SENSITIVE Sensitive     CEFTAZIDIME <=1 SENSITIVE Sensitive     CEFTRIAXONE <=1 SENSITIVE Sensitive     CIPROFLOXACIN <=0.25 SENSITIVE Sensitive     GENTAMICIN <=1 SENSITIVE Sensitive     IMIPENEM 1 SENSITIVE Sensitive     TRIMETH/SULFA <=20 SENSITIVE Sensitive     AMPICILLIN/SULBACTAM 16 INTERMEDIATE Intermediate     PIP/TAZO <=4 SENSITIVE Sensitive     *  FEW MORGANELLA MORGANII  MRSA PCR Screening     Status: None   Collection Time: 06/15/17 12:48 AM  Result Value Ref Range  Status   MRSA by PCR NEGATIVE NEGATIVE Final    Comment:        The GeneXpert MRSA Assay (FDA approved for NASAL specimens only), is one component of a comprehensive MRSA colonization surveillance program. It is not intended to diagnose MRSA infection nor to guide or monitor treatment for MRSA infections. Performed at [facility], [place] [address] [place], [place] [address]   Culture, blood (routine x 2)     Status: None (Preliminary result)   Collection Time: 06/16/17 10:01 AM  Result Value Ref Range Status   Specimen Description BLOOD RIGHT ANTECUBITAL  Final   Special Requests   Final    BOTTLES DRAWN AEROBIC AND ANAEROBIC Blood Culture results may not be optimal due to an excessive volume of blood received in culture bottles   Culture   Final    NO GROWTH 3 DAYS Performed at [facility], [place] [address], [place], [place] [address]    Report Status PENDING  Incomplete  Culture, blood (routine x 2)     Status: None (Preliminary result)   Collection Time: 06/16/17 10:01 AM  Result Value Ref Range Status   Specimen Description BLOOD RIGHT HAND  Final   Special Requests   Final    BOTTLES DRAWN AEROBIC AND ANAEROBIC Blood Culture results may not be optimal due to an inadequate volume of blood received in culture bottles   Culture   Final    NO GROWTH 3 DAYS Performed at [facility], [place] [address], [place], [place] [address]    Report Status PENDING  Incomplete         Radiology Studies: No results found.      Scheduled Meds: . amiodarone  200 mg Oral Daily  . calcitRIOL  0.5 mcg Oral Q T,Th,Sat-1800  . darbepoetin (ARANESP) injection - DIALYSIS  150 mcg Intravenous Q Thu-HD  . insulin aspart  0-15 Units Subcutaneous TID WC  . insulin aspart  0-5 Units Subcutaneous QHS  . insulin aspart  5 Units Subcutaneous TID WC  .  insulin glargine  25 Units Subcutaneous QHS  . predniSONE  30 mg Oral Q breakfast  . sodium chloride flush  3 mL Intravenous Q12H   Continuous Infusions: . cefTAZidime (FORTAZ)  IV Stopped (06/19/17 1113)     LOS: 12 days        Vernell Leep, MD, FACP, [facility]. [facility] Pager [phone]  If 7PM-7AM, please contact night-coverage [email or web address] Password [email or web address] 06/20/2017, 1:26 PM

## 2017-06-21 ENCOUNTER — Inpatient Hospital Stay (HOSPITAL_COMMUNITY): Payer: BLUE CROSS/BLUE SHIELD

## 2017-06-21 DIAGNOSIS — M4628 Osteomyelitis of vertebra, sacral and sacrococcygeal region: Secondary | ICD-10-CM

## 2017-06-21 LAB — CBC WITH DIFFERENTIAL/PLATELET
BASOS PCT: 0 %
Basophils Absolute: 0 10*3/uL (ref 0.0–0.1)
EOS ABS: 0 10*3/uL (ref 0.0–0.7)
Eosinophils Relative: 0 %
HEMATOCRIT: 22.4 % — AB (ref 39.0–52.0)
Hemoglobin: 7.1 g/dL — ABNORMAL LOW (ref 13.0–17.0)
LYMPHS ABS: 1.6 10*3/uL (ref 0.7–4.0)
Lymphocytes Relative: 6 %
MCH: 26.6 pg (ref 26.0–34.0)
MCHC: 31.7 g/dL (ref 30.0–36.0)
MCV: 83.9 fL (ref 78.0–100.0)
MONO ABS: 1.1 10*3/uL — AB (ref 0.1–1.0)
Monocytes Relative: 4 %
NEUTROS PCT: 90 %
Neutro Abs: 23.9 10*3/uL — ABNORMAL HIGH (ref 1.7–7.7)
PLATELETS: 453 10*3/uL — AB (ref 150–400)
RBC: 2.67 MIL/uL — AB (ref 4.22–5.81)
RDW: 19.4 % — AB (ref 11.5–15.5)
WBC: 26.6 10*3/uL — AB (ref 4.0–10.5)

## 2017-06-21 LAB — CULTURE, BLOOD (ROUTINE X 2)
CULTURE: NO GROWTH
Culture: NO GROWTH

## 2017-06-21 LAB — GLUCOSE, CAPILLARY
GLUCOSE-CAPILLARY: 107 mg/dL — AB (ref 65–99)
Glucose-Capillary: 168 mg/dL — ABNORMAL HIGH (ref 65–99)
Glucose-Capillary: 128 mg/dL — ABNORMAL HIGH (ref 65–99)
Glucose-Capillary: 94 mg/dL (ref 65–99)

## 2017-06-21 LAB — MAGNESIUM: Magnesium: 2 mg/dL (ref 1.7–2.4)

## 2017-06-21 LAB — PREPARE RBC (CROSSMATCH)

## 2017-06-21 MED ORDER — SODIUM CHLORIDE 0.9 % IV SOLN: Freq: Once | INTRAVENOUS | Status: DC

## 2017-06-21 MED ORDER — DAKINS (1/4 STRENGTH) 0.125 % EX SOLN
Freq: Two times a day (BID) | CUTANEOUS | Status: AC
  Administered 2017-06-21 – 2017-06-23 (×4)
  Filled 2017-06-21 (×2): qty 473

## 2017-06-21 NOTE — Progress Notes (Signed)
[facility] for Infectious Disease  Date of Admission:  06/08/2017             ASSESSMENT/PLAN  [name] has remained afebrile over the last 24 hours with gradually improving leukocytosis. He has 2 bowel movements in the last 24 hours.  Continues to receive ceftazidime with dialysis for sacral wound infection and osteomyelitis with Morganella morganii. Fevers of uncertain origin. Could possibly be medication related as John Bailey does have incidence of fever (generally <2%) or potential for HD cath infection. Recommend continuing to monitor. C. Diff testing awaiting collection. Diarrhea could be related to [place].  1. Continue ceftazidime with dialysis and continue to monitor fever curve and leukocytes. Consider return to vancomycin if fevers continue.  2. Awaiting collection of stool sample to rule out C. Diff.  3. MRI of the sacrum to evaluate for further areas of infection. 4. Wound care per general surgery.  5. We will continue to follow.    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 amiodarone  200 mg Oral Daily  . calcitRIOL  0.5 mcg Oral Q T,Th,Sat-1800  . darbepoetin (ARANESP) injection - DIALYSIS  150 mcg Intravenous Q Thu-HD  . insulin aspart  0-15 Units Subcutaneous TID WC  . insulin aspart  0-5 Units Subcutaneous QHS  . insulin aspart  5 Units Subcutaneous  TID WC  . insulin glargine  25 Units Subcutaneous QHS  . predniSONE  30 mg Oral Q breakfast  . sodium chloride flush  3 mL Intravenous Q12H  . sodium hypochlorite   Irrigation BID    SUBJECTIVE:  Afebrile overnight. Continued leukocytosis with some improvement now down to 26.6. Receiving ceftazidime for Morganella infection of the sacrum. Blood cultures from 4/3 with no growth to date. Reports 2 bowel movements in the last 24 hours. Denies night sweats, fevers, or chills.    No Known Allergies   Review of Systems: Review of Systems  Constitutional: Negative for chills, fever and malaise/fatigue.  Respiratory: Negative for cough, shortness of breath and wheezing.   Cardiovascular: Negative for chest pain.  Gastrointestinal: Positive for diarrhea. Negative for abdominal pain, constipation, nausea and vomiting.  Skin: Negative for rash.      OBJECTIVE: Vitals:   06/20/17 0510 06/20/17 1447 06/20/17 2136 06/21/17 0518  BP: 122/73 125/73 124/72 139/80  Pulse: 81 85 80 87  Resp: 18 16 18 18   Temp: (!) 97.5 F (36.4 C) 97.7 F (36.5 C) 97.9 F (36.6 C) 97.8 F (36.6 C)  TempSrc: Oral Oral Oral Oral  SpO2: 98% 95% 100% 94%  Weight:    274 lb (124.3 kg)  Height:       Body mass index is 37.16 kg/m.  Physical Exam  Constitutional: He is oriented to person, place, and time and well-developed, well-nourished, and in no distress. No distress.  Cardiovascular: Normal rate, regular rhythm, normal heart sounds and intact distal pulses. Exam  reveals no gallop and no friction rub.  No murmur heard. Pulmonary/Chest: Effort normal and breath sounds normal. No respiratory distress. He has no wheezes. He has no rales. He exhibits no tenderness.  Abdominal: Soft. Bowel sounds are normal. He exhibits no distension. There is no tenderness.  Neurological: He is alert and oriented to person, place, and time.  Skin: Skin is warm and dry. No rash noted.  Sacrum dressing clean, dry and intact.   Psychiatric: Affect and judgment normal.    Lab Results Lab Results  Component Value Date   WBC 26.6 (H) 06/21/2017   HGB 7.1 (L) 06/21/2017   HCT 22.4 (L) 06/21/2017   MCV 83.9 06/21/2017   PLT 453 (H) 06/21/2017    Lab Results  Component Value Date   CREATININE 4.21 (H) 06/20/2017   BUN 18 06/20/2017   NA 133 (L) 06/20/2017   K 3.3 (L) 06/20/2017   CL 94 (L) 06/20/2017   CO2 22 06/20/2017    Lab Results  Component Value Date   ALT 16 (L) 06/15/2017   AST 23 06/15/2017   ALKPHOS 96 06/15/2017   BILITOT 0.8 06/15/2017     Microbiology: Recent Results (from the past 240 hour(s))  Culture, blood (routine x 2)     Status: None   Collection Time: 06/13/17 11:26 AM  Result Value Ref Range Status   Specimen Description BLOOD RIGHT HAND  Final   Special Requests   Final    IN BOTH AEROBIC AND ANAEROBIC BOTTLES Blood Culture adequate volume   Culture   Final    NO GROWTH 5 DAYS Performed at [facility], [place] [address] [place], [place] [address]    Report Status 06/18/2017 FINAL  Final  Culture, blood (routine x 2)     Status: Abnormal   Collection Time: 06/13/17 11:26 AM  Result Value Ref Range Status   Specimen Description BLOOD RIGHT HAND  Final   Special Requests   Final    IN BOTH AEROBIC AND ANAEROBIC BOTTLES Blood Culture adequate volume   Culture  Setup Time   Final    GRAM POSITIVE COCCI ANAEROBIC BOTTLE ONLY CRITICAL RESULT CALLED TO, READ BACK BY AND VERIFIED WITH: Karlene Einstein PharmD 14:40 06/14/17 ([email or web address])    Culture (A)  Final    STAPHYLOCOCCUS SPECIES (COAGULASE NEGATIVE) THE SIGNIFICANCE OF ISOLATING THIS ORGANISM FROM A SINGLE SET OF BLOOD CULTURES WHEN MULTIPLE SETS ARE DRAWN IS UNCERTAIN. PLEASE NOTIFY THE MICROBIOLOGY DEPARTMENT WITHIN ONE WEEK IF SPECIATION AND SENSITIVITIES ARE REQUIRED. Performed at [facility], [place] [address], [place], [place] [address]    Report Status 06/16/2017 FINAL  Final  Blood Culture ID Panel  (Reflexed)     Status: Abnormal   Collection Time: 06/13/17 11:26 AM  Result Value Ref Range Status   Enterococcus species NOT DETECTED NOT DETECTED Final   Listeria monocytogenes NOT DETECTED NOT DETECTED Final   Staphylococcus species DETECTED (A) NOT DETECTED Final    Comment: Methicillin (oxacillin) resistant coagulase negative staphylococcus. Possible blood culture contaminant (unless isolated from more than one blood culture draw or clinical case suggests pathogenicity). No antibiotic treatment is indicated for blood  culture contaminants. CRITICAL RESULT CALLED TO, READ BACK BY AND VERIFIED WITH: Karlene Einstein PharmD 14:40 06/14/17 ([email or web address])    Staphylococcus aureus NOT DETECTED NOT DETECTED Final   Methicillin resistance DETECTED (A) NOT DETECTED Final    Comment: CRITICAL RESULT CALLED TO, READ BACK BY AND VERIFIED WITH: Karlene Einstein PharmD 14:40  06/14/17 ([email or web address])    Streptococcus species NOT DETECTED NOT DETECTED Final   Streptococcus agalactiae NOT DETECTED NOT DETECTED Final   Streptococcus pneumoniae NOT DETECTED NOT DETECTED Final   Streptococcus pyogenes NOT DETECTED NOT DETECTED Final   Acinetobacter baumannii NOT DETECTED NOT DETECTED Final   Enterobacteriaceae species NOT DETECTED NOT DETECTED Final   Enterobacter cloacae complex NOT DETECTED NOT DETECTED Final   Escherichia coli NOT DETECTED NOT DETECTED Final   Klebsiella oxytoca NOT DETECTED NOT DETECTED Final   Klebsiella pneumoniae NOT DETECTED NOT DETECTED Final   Proteus species NOT DETECTED NOT DETECTED Final   Serratia marcescens NOT DETECTED NOT DETECTED Final   Haemophilus influenzae NOT DETECTED NOT DETECTED Final   Neisseria meningitidis NOT DETECTED NOT DETECTED Final   Pseudomonas aeruginosa NOT DETECTED NOT DETECTED Final   Candida albicans NOT DETECTED NOT DETECTED Final   Candida glabrata NOT DETECTED NOT DETECTED Final   Candida krusei NOT DETECTED NOT DETECTED Final   Candida parapsilosis NOT  DETECTED NOT DETECTED Final   Candida tropicalis NOT DETECTED NOT DETECTED Final    Comment: Performed at [facility], [place] [address], [place], [place] [address]  Aerobic/Anaerobic Culture (surgical/deep wound)     Status: None   Collection Time: 06/14/17  7:52 PM  Result Value Ref Range Status   Specimen Description ABSCESS GLUTEAL  Final   Special Requests PATIENT ON FOLLOWING ZOSYN AND VANC  Final   Gram Stain   Final    FEW WBC PRESENT, PREDOMINANTLY PMN FEW GRAM POSITIVE COCCI FEW GRAM NEGATIVE RODS RARE GRAM POSITIVE RODS Performed at [facility], [place] [address] [place], [place] [address]    Culture   Final    FEW MORGANELLA MORGANII MIXED ANAEROBIC FLORA PRESENT.  CALL LAB IF FURTHER IID REQUIRED.    Report Status 06/17/2017 FINAL  Final   Organism ID, Bacteria MORGANELLA MORGANII  Final      Susceptibility   Morganella morganii - MIC*    AMPICILLIN >=32 RESISTANT Resistant     CEFAZOLIN >=64 RESISTANT Resistant     CEFEPIME <=1 SENSITIVE Sensitive     CEFTAZIDIME <=1 SENSITIVE Sensitive     CEFTRIAXONE <=1 SENSITIVE Sensitive     CIPROFLOXACIN <=0.25 SENSITIVE Sensitive     GENTAMICIN <=1 SENSITIVE Sensitive     IMIPENEM 1 SENSITIVE Sensitive     TRIMETH/SULFA <=20 SENSITIVE Sensitive     AMPICILLIN/SULBACTAM 16 INTERMEDIATE Intermediate     PIP/TAZO <=4 SENSITIVE Sensitive     * FEW MORGANELLA MORGANII  MRSA PCR Screening     Status: None   Collection Time: 06/15/17 12:48 AM  Result Value Ref Range Status   MRSA by PCR NEGATIVE NEGATIVE Final    Comment:        The GeneXpert MRSA Assay (FDA approved for NASAL specimens only), is one component of a comprehensive MRSA colonization surveillance program. It is not intended to diagnose MRSA infection nor to guide or monitor treatment for MRSA infections. Performed at [facility], [place] [address], [place], [place] [address]   Culture, blood (routine x 2)     Status: None (Preliminary result)     Collection Time: 06/16/17 10:01 AM  Result Value Ref Range Status   Specimen Description BLOOD RIGHT ANTECUBITAL  Final   Special Requests   Final    BOTTLES DRAWN AEROBIC AND ANAEROBIC Blood Culture results may not be optimal due to an excessive volume of blood received in culture bottles  Culture   Final    NO GROWTH 4 DAYS Performed at [facility], [place] [address] [place], [place] [address]    Report Status PENDING  Incomplete  Culture, blood (routine x 2)     Status: None (Preliminary result)   Collection Time: 06/16/17 10:01 AM  Result Value Ref Range Status   Specimen Description BLOOD RIGHT HAND  Final   Special Requests   Final    BOTTLES DRAWN AEROBIC AND ANAEROBIC Blood Culture results may not be optimal due to an inadequate volume of blood received in culture bottles   Culture   Final    NO GROWTH 4 DAYS Performed at [facility], [place] [address] [place], [place] [address]    Report Status PENDING  Incomplete     Terri Piedra, NP [place] for [place] Pager  06/21/2017  9:43 AM

## 2017-06-21 NOTE — NC FL2 (Signed)
[place] LEVEL OF CARE SCREENING TOOL     IDENTIFICATION  Patient Name: John Bailey Birthdate: July 04, 1970 Sex: male Admission Date (Current Location): 06/08/2017  [facility] [place] and [place] Number:  Herbalist and Address:  [facility], [place] [address], [place], [place] [address]      Provider Number: [id number]  Attending Physician Name and Address:  Modena Jansky, MD  Relative Name and Phone Number:  Keanon Bevins, spouse, [phone]    Current Level of Care: Hospital Recommended Level of Care: [place] Prior Approval Number:    Date Approved/Denied:   PASRR Number: [id number]  Discharge Plan: SNF    Current Diagnoses: Patient Active Problem List   Diagnosis Date Noted  . Sacral osteomyelitis ([place])   . Sacral wound 06/08/2017  . Pressure injury of sacral region, unstageable ([place])   . Noncompliance   . Constipation   . Steroid-induced hyperglycemia   . HSV (herpes simplex virus) infection   . Neuropathic pain   . Labile blood pressure   . Labile blood glucose   . Carpal tunnel syndrome of left wrist   . Pain   . Leukocytosis   . PAF (paroxysmal atrial fibrillation) ([place])   . Acute on chronic diastolic (congestive) heart failure ([place])   . Type 2 diabetes mellitus with peripheral neuropathy (HCC)   . Acute blood loss anemia   . Anemia of chronic disease   . ESRD on dialysis ([place])   . Cardiorenal syndrome with renal failure   . Cardiorenal syndrome, stage 1-4 or unspecified chronic kidney disease, with heart failure ([place]) 05/06/2017  . Atrial fibrillation, chronic ([place]) 04/22/2017  . Hypoxia 04/22/2017  . Excessive daytime sleepiness 04/02/2017  . Debility 04/02/2017  . Bradycardia 03/28/2017  . CKD (chronic kidney disease) stage 4, GFR 15-29 ml/min (HCC) 03/28/2017  . Pseudogout 03/09/2017  . Rheumatoid arthritis ([place]) 03/09/2017  . Infected ulcer of skin, with fat layer exposed ([place])  03/09/2017  . Normocytic anemia 03/06/2017  . Elevated troponin 03/06/2017  . Acute renal failure superimposed on stage 4 chronic kidney disease ([place]) 03/06/2017  . Essential hypertension 12/29/2016  . Persistent atrial fibrillation ([place])   . Left leg cellulitis   . Atrial fibrillation with RVR ([place]) 12/02/2016  . Sepsis ([place]) 12/02/2016  . Acute on chronic congestive heart failure ([place])   . Acute on chronic renal insufficiency   . Chronic diastolic CHF (congestive heart failure) ([place]) 03/20/2016  . Nonischemic cardiomyopathy ([place]) 11/11/2015  . Long term (current) use of anticoagulants [Z79.01] 08/14/2015  . Insulin-dependent diabetes mellitus with renal complications ([place]) 02/72/5366  . Atrial flutter with rapid ventricular response ([place]) 08/06/2015  . Hyponatremia 07/30/2015  . Acute kidney injury ([place]) 07/30/2015  . Hyperkalemia 07/30/2015  . Acute on chronic diastolic CHF (congestive heart failure) ([place]) 07/30/2015  . Morbid obesity ([place]) 07/30/2015  . Paroxysmal atrial fibrillation ([place]) 07/30/2015  . 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 Hypertensive heart disease with heart failure ([place]) 07/14/2012  . Dyspnea 07/14/2012  . Hyperlipidemia 07/14/2012    Orientation RESPIRATION BLADDER Height & Weight     Self, Time, Situation, Place  Normal Continent Weight: 124.3 kg (274 lb) Height:  6' (182.9 cm)  BEHAVIORAL SYMPTOMS/MOOD NEUROLOGICAL BOWEL NUTRITION STATUS      Continent Diet(Please see DC Summary)  AMBULATORY STATUS COMMUNICATION OF NEEDS Skin   Extensive Assist Verbally PU Stage and Appropriate Care(deep tissue injury on heel; Stage  IV on sacrum with closed inicion and non-pressure wound; wound on buttocks;)                       Personal Care Assistance Level of Assistance  Bathing, Feeding, Dressing Bathing Assistance: Maximum assistance Feeding assistance: Limited assistance Dressing Assistance: Maximum assistance      Functional Limitations Info  Sight, Hearing, Speech Sight Info: Impaired Hearing Info: Adequate Speech Info: Adequate    SPECIAL CARE FACTORS FREQUENCY  PT (By licensed PT), OT (By licensed OT)     PT Frequency: 5x/week OT Frequency: 3x/week            Contractures Contractures Info: Not present    Additional Factors Info  Code Status, Allergies, Insulin Sliding Scale, Isolation Precautions Code Status Info: Full Allergies Info: NKA   Insulin Sliding Scale Info: 3x daily with meals and at bedtime Isolation Precautions Info: Enteric precautions     Current Medications (06/21/2017):  This is the current hospital active medication list Current Facility-Administered Medications  Medication Dose Route Frequency Provider Last Rate Last Dose  . acetaminophen (TYLENOL) tablet 650 mg  650 mg Oral Q6H PRN Kinsinger, Arta Bruce, MD   650 mg at 06/19/17 2102   Or  . acetaminophen (TYLENOL) suppository 650 mg  650 mg Rectal Q6H PRN Kinsinger, Arta Bruce, MD   650 mg at 06/17/17 1841  . amiodarone (PACERONE) tablet 200 mg  200 mg Oral Daily Kinsinger, Arta Bruce, MD   200 mg at 06/21/17 0950  . bisacodyl (DULCOLAX) suppository 10 mg  10 mg Rectal Daily PRN Kinsinger, Arta Bruce, MD      . calcitRIOL (ROCALTROL) capsule 0.5 mcg  0.5 mcg Oral Q T,Th,Sat-1800 Kinsinger, Arta Bruce, MD   0.5 mcg at 06/19/17 1827  . cefTAZidime (FORTAZ) 2 g in sodium chloride 0.9 % 100 mL IVPB  2 g Intravenous Q T,Th,Sa-HD Kris Mouton, [facility]   Stopped at 06/19/17 1113  . Darbepoetin Alfa (ARANESP) injection 150 mcg  150 mcg Intravenous Q Thu-HD Kinsinger, Arta Bruce, MD   150 mcg at 06/17/17 0936  . HYDROcodone-acetaminophen (NORCO/VICODIN) 5-325 MG per tablet 1 tablet  1 tablet Oral Q6H PRN Kinsinger, Arta Bruce, MD      . insulin aspart (novoLOG) injection 0-15 Units  0-15 Units Subcutaneous TID WC Kinsinger, Arta Bruce, MD   3 Units at 06/21/17 0910  . insulin aspart (novoLOG) injection 0-5 Units  0-5  Units Subcutaneous QHS Kinsinger, Arta Bruce, MD   2 Units at 06/10/17 2203  . insulin aspart (novoLOG) injection 5 Units  5 Units Subcutaneous TID WC Kinsinger, Arta Bruce, MD   5 Units at 06/21/17 1334  . insulin glargine (LANTUS) injection 25 Units  25 Units Subcutaneous QHS Kinsinger, Arta Bruce, MD   25 Units at 06/20/17 2211  . methocarbamol (ROBAXIN) tablet 500 mg  500 mg Oral Q6H PRN Kinsinger, Arta Bruce, MD   500 mg at 06/19/17 1323  . naloxone [facility]) injection 0.4 mg  0.4 mg Intravenous PRN Kinsinger, Arta Bruce, MD   0.4 mg at 06/14/17 1459  . ondansetron (ZOFRAN) tablet 4 mg  4 mg Oral Q6H PRN Kinsinger, Arta Bruce, MD       Or  . ondansetron [facility]) injection 4 mg  4 mg Intravenous Q6H PRN Kinsinger, Arta Bruce, MD   4 mg at 06/14/17 1950  . predniSONE (DELTASONE) tablet 30 mg  30 mg Oral Q breakfast Hongalgi, Lenis Dickinson, MD   30  mg at 06/21/17 0900  . senna-docusate (Senokot-S) tablet 1 tablet  1 tablet Oral QHS PRN Kinsinger, Arta Bruce, MD   1 tablet at 06/08/17 2206  . sodium chloride flush (NS) 0.9 % injection 3 mL  3 mL Intravenous Q12H Modena Jansky, MD   3 mL at 06/21/17 0950  . sodium hypochlorite (DAKIN'S 1/4 STRENGTH) topical solution   Irrigation BID Saverio Danker, PA-C      . traMADol [facility]) tablet 50-100 mg  50-100 mg Oral Q12H PRN Earnstine Regal, PA-C   100 mg at 06/19/17 1322     Discharge Medications: Please see discharge summary for a list of discharge medications.  Relevant Imaging Results:  Relevant Lab Results:   Additional Information SSN: [id number]  Gets HD on TTS 2nd shift at [facility]. On an air [place], [place]

## 2017-06-21 NOTE — Progress Notes (Signed)
Patient ID: John Bailey, male   DOB: 1970/08/26, 47 y.o.   MRN: [id number] [facility] Progress Note   Assessment/ Plan:   1. Acute Blood Loss Anemia from gluteal abscess site-hemoglobin relatively stable overnight status post PRBC transfusion, plan to transfuse another 2 units PRBC with dialysis tomorrow. 2. Sacral decubitus with osteomyelitis: Afebrile over the past 24 hours with some improvement of leukocytosis.  Continue ceftazidime with hemodialysis for osteomyelitis/sacral wound-Morganella. 3. ESRDcontinue with hemodialysis on a Tuesday/Thursday/Saturday schedule.  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 Nutrition:Continue renal diet with ongoing protein supplementation 6. Hypertension:stable and continue with UF. 7. Vascular access- LAVF placed 05/10/17-maturation appears to be on the sluggish side, continue using TDC at this time. 8. Atrial fibrillation- per primary 9. Rheumatoid arthritis flare- on prednisone taper. 10. Deconditioning- continue with PT/OT  Subjective:   Reports to be feeling better-no further diarrhea and afebrile overnight.   Objective:   BP 127/83 (BP Location: Left Arm)   Pulse 82   Temp 97.6 F (36.4 C) (Oral)   Resp 19   Ht 6' (1.829 m)   Wt 124.3 kg (274 lb)   SpO2 95%   BMI 37.16 kg/m   Physical Exam: Gen: Comfortably resting in bed, family member at bedside CVS: Pulse regular rhythm, normal rate, ejection systolic murmur over apex Resp: Diminished breath sounds with bases, no rales/rhonchi Abd: Soft, obese, nontender Ext: No lower extremity edema, palpable thrill left RCF  Labs: BMET Recent Labs  Lab 06/14/17 1644 06/15/17 0240 06/16/17 0259 06/17/17 0351 06/18/17 0317 06/19/17 0251 06/20/17 0254  NA 130* 131* 134* 135 135 135 133*  K 4.2 5.1 5.1 3.6 3.5 4.2 3.3*  CL 95* 96* 96* 96* 97* 97* 94*  CO2 22 21* 22 25 24 22 22   GLUCOSE 127* 172*  152* 115* 116* 109* 185*  BUN 43* 48* 28* 35* 16 24* 18  CREATININE 7.00* 7.53* 4.85* 6.25* 4.26* 5.51* 4.21*  CALCIUM 8.0* 8.4* 7.9* 7.8* 7.7* 8.1* 7.9*   CBC Recent Labs  Lab 06/18/17 0317 06/19/17 0251 06/19/17 1647 06/20/17 0254 06/21/17 0326  WBC 30.9* 30.9* 31.5* 29.2* 26.6*  NEUTROABS 26.5* 28.5*  --  27.1* 23.9*  HGB 6.6* 7.1* 7.3* 7.3* 7.1*  HCT 20.5* 22.0* 23.5* 23.3* 22.4*  MCV 90.3 88.7 85.8 86.0 83.9  PLT 452* 460* 427* 413* 453*   Medications:    . amiodarone  200 mg Oral Daily  . calcitRIOL  0.5 mcg Oral Q T,Th,Sat-1800  . darbepoetin (ARANESP) injection - DIALYSIS  150 mcg Intravenous Q Thu-HD  . insulin aspart  0-15 Units Subcutaneous TID WC  . insulin aspart  0-5 Units Subcutaneous QHS  . insulin aspart  5 Units Subcutaneous TID WC  . insulin glargine  25 Units Subcutaneous QHS  . predniSONE  30 mg Oral Q breakfast  . sodium chloride flush  3 mL Intravenous Q12H  . sodium hypochlorite   Irrigation BID   Elmarie Shiley, MD 06/21/2017, 2:03 PM

## 2017-06-21 NOTE — Progress Notes (Signed)
Physical Therapy Wound Treatment Patient Details  Name: LOT MEDFORD MRN: [id number] Date of Birth: December 10, 1970  Today's Date: 06/21/2017 Time: 4944-9675 Time Calculation (min): 35 min  Subjective  Patient and Family Stated Goals: heal wounds  Prior Treatments: Surgical I&D 06/04/17 and 06/14/17  Pain Score:  6/10 after debridement  Wound Assessment     Wound / Incision (Open or Dehisced) 05/28/17 Non-pressure wound Sacrum Right;Left;Medial R buttock (Active)  Wound Image   06/02/2017 11:00 AM  Dressing Type Gauze (Comment) 06/20/2017  9:00 PM  Dressing Changed Changed 06/14/2017  2:00 PM  Dressing Status Clean;Intact 06/20/2017  9:00 PM  Dressing Change Frequency Twice a day 06/20/2017  9:00 PM  Site / Wound Assessment Granulation tissue;Yellow;Painful 06/14/2017  2:00 PM  % Wound base Red or Granulating 70% 06/14/2017  2:00 PM  % Wound base Yellow/Fibrinous Exudate 30% 06/14/2017  2:00 PM  Peri-wound Assessment Denuded;Pink;Induration 06/14/2017  2:00 PM  Wound Length (cm) 3.5 cm 06/09/2017  5:00 PM  Wound Width (cm) 2.5 cm 06/09/2017  5:00 PM  Wound Surface Area (cm^2) 8.75 cm^2 06/09/2017  5:00 PM  Margins Unattached edges (unapproximated) 06/14/2017  2:00 PM  Closure None 06/14/2017  2:00 PM  Drainage Amount Moderate 06/11/2017 11:49 PM  Drainage Description Serous 06/14/2017  2:00 PM  Treatment Cleansed 06/14/2017  2:00 PM   DAKIN soaked guaze    Hydrotherapy Pulsed Lavage with Suction (psi): (4 psi due to increased pain)   Wound Assessment and Plan  Wound Therapy - Assess/Plan/Recommendations Wound Therapy - Clinical Statement: Still have 2 areas of concern on the right side wall and tunnel on the right inferior aspect on the wound with black necrotic tissue left after debridement. Factors Delaying/Impairing Wound Healing: Immobility;Multiple medical problems;Diabetes Mellitus Hydrotherapy Plan: Pulsatile lavage with suction;Patient/family education;Dressing change Wound Therapy - Frequency: 6X /  week Wound Therapy - Follow Up Recommendations: Skilled nursing facility Wound Plan: see above  Wound Therapy Goals- Improve the function of patient's integumentary system by progressing the wound(s) through the phases of wound healing (inflammation - proliferation - remodeling) by: Decrease Necrotic Tissue to: 20 Decrease Necrotic Tissue - Progress: Progressing toward goal Increase Granulation Tissue to: 80 Increase Granulation Tissue - Progress: Progressing toward goal Goals/treatment plan/discharge plan were made with and agreed upon by patient/family: Yes Time For Goal Achievement: 7 days Wound Therapy - Potential for Goals: Fair  Goals will be updated until maximal potential achieved or discharge criteria met.  Discharge criteria: when goals achieved, discharge from hospital, MD decision/surgical intervention, no progress towards goals, refusal/missing three consecutive treatments without notification or medical reason.  GP     Tessie Fass Toluwani Ruder  06/21/2017, 4:15 PM  06/21/2017  Donnella Sham, [place] [phone]  (pager)

## 2017-06-21 NOTE — Plan of Care (Signed)
Patient educated on VTE prophylaxis and agrees to now wear SCDs. Also educated patient on pressure injury prevention when he stated he did not want to be repositioned. Patient verbalized understanding and agreed to be repositioned.

## 2017-06-21 NOTE — Progress Notes (Signed)
Patient ID: John Bailey, male   DOB: 05/29/1970, 47 y.o.   MRN: [id number]  PROGRESS NOTE    John Bailey  UXN:[id number] DOB: 05-21-1970 DOA: 06/08/2017 PCP: [name], MD   Brief Narrative:  47 year old male with history of RA, PVD, hypertension, end-stage renal disease on hemodialysis, A. fib on Eliquis, diabetes and chronic diastolic CHF and sacral and coccyx pressure ulcer who was admitted from 04/22/2017-05/13/2017 with anasarca from acute on chronic diastolic heart failure and A. fib with RVR (cardioverted) and was discharged to inpatient rehab for debility.  He underwent debridement of sacral decubitus ulcer on 06/04/2017 by general surgery.  He was supposed to be discharged from [facility] on 06/09/2017 but general surgery recommended continued inpatient wound care so patient was readmitted to [facility] on 06/09/2017.  He required debridement of the right gluteal abscess on 06/14/2017 by general surgery.  ID, general surgery continue to see patient.  Had bleeding from right gluteal wound over weekend, Eliquis held pending clearance by surgery.  Spiked fever over weekend, ID evaluating.   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  One set of blood culture 3/31 grew coagulase-negative staph, probably a contaminant.  Second set of blood culture 3/31, negative and final report.  Blood cultures x2 from 4/3: Negative to date. 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  ID following.  Had some diarrhea 4/7, C. difficile ordered but diarrhea has improved and patient has not had BM since yesterday.  Fevers of uncertain origin.  MRI lumbar spine obtained and no osteomyelitis noted.?  Antibiotic related fever.  C. difficile order expired and will not reorder in the absence of diarrhea.  Continue [name].  Fevers better. -Noted excessive bleeding from wound site on 4/6.  Surgeons packed wound at bedside.  As discussed with surgery, temporarily holding Eliquis (last dose 4/6 morning) until reassessment and advise by them.  Bleeding controlled now. -As per CCS follow-up, no evidence of bleeding today.  Wound care per surgery.  The recommend holding Eliquis today, but likely can resume soon.  Acute metabolic encephalopathy -Probably secondary to above. Resolved.  CT of the head was negative for acute abnormality  Unstageable Sacral ulcer status post debridement -Debridement was done by Dr. [place] Silos on 06/04/2017.   -General surgery following.  -Hydrotherapy will be resumed from today as per surgery recommendations.  Dressing changes as per general surgery. -Antibiotic management as indicated above. -This wound was not bleeding during exam 4/6.  Some slough noted at the base.  Pictures as below.  ESRD on HD  -  Nephrology following.  Dialysis as per nephrology schedule.  Underwent HD on 4/6.   Hypertension - Coreg on hold due to soft blood pressures earlier on.  Stable now.  Type  II Diabetes  -Continue current dose of Levemir along with NovoLog with meals.  CBGs starting to increase, likely related to prednisone started for RA.  May need to adjust insulins.  Continue current regimen for today.  Leukocytosis, likely reactive, related to underlying sacral wound and steroids in the setting of RA   -Leukocytosis has worsened from 18-30, likely due to hydrotherapy.  Antibiotics as above.  Improving.  Anemia of chronic disease  and acute blood loss anemia. -Hemoglobin dropped to 6.6 on 4/5.  Transfuse 1 unit PRBC on 4/5 and second unit at dialysis on 4/6.  He received 1 unit PRBC across HD 4/6.  Hemoglobin stable in the 7.3-7.1 range over the last couple days.  Follow CBC across dialysis in a.m. and transfuse if hemoglobin 7 or less.   Atrial Fibrillation CHA2DS2-VASc score 2-3 , was on anticoagulation with Eliquis. Echo 04/25/17  EF 30-16, normal systolic  -Status post recent cardioversion during last hospitalization.  Currently rate controlled Continue amiodarone.  Hold Coreg.   Eliquis held 4/6 due to bleeding from wound.  Resume when cleared by general surgery.  Chronic diastolic heart failure.   -Recent hospitalization for acute decompensated heart failure. -Currently compensated and volume is managed by dialysis.  Hold Coreg.  Outpatient follow-up with cardiology  Rheumatoid arthritis with flare Completed prednisone taper which was started on 05/19/2017. outpatient follow-up with rheumatology.  Patient had been receiving abatacept every Tuesday prior to admission; will hold it for now. It appears that patient completed prednisone taper on 06/12/17.  On 4/6, patient complains of diffuse joint pains consistent with prior RA flare.  Started prednisone at 30 mg daily.  Pain continues to improve  Deconditioning  -Continue PT/OT  Morbid obesity -Outpatient follow-up  DVT prophylaxis:  Eliquis> held on 4/6.  SCDs. Code Status:    Full  Family Communication:   None at  bedside today. Disposition Plan: SNF vs LTAC once clinically improves  Consultants: General surgery/nephrology/ID  Procedures: None  Antimicrobials: Zosyn from 06/14/2017-06/15/17 Vancomycin from 06/14/2017 onwards-discontinued 4/5. Ceftazidime from 06/16/2017 onwards   Subjective: Diarrhea improved and no BM since yesterday.  Pain in his hand joints have significantly improved but not returned to normal.  As per RN, no significant bleeding from wounds.  No high fevers.  Objective: Vitals:   06/20/17 1447 06/20/17 2136 06/21/17 0518 06/21/17 1252  BP: 125/73 124/72 139/80 127/83  Pulse: 85 80 87 82  Resp: 16 18 18 19   Temp: 97.7 F (36.5 C) 97.9 F (36.6 C) 97.8 F (36.6 C) 97.6 F (36.4 C)  TempSrc: Oral Oral Oral Oral  SpO2: 95% 100% 94% 95%  Weight:   124.3 kg (274 lb)   Height:        Intake/Output Summary (Last 24 hours) at 06/21/2017 1846 Last data filed at 06/21/2017 1333 Gross per 24 hour  Intake 200 ml  Output -  Net 200 ml   Filed Weights   06/19/17 1113 06/20/17 0500 06/21/17 0518  Weight: 121.7 kg (268 lb 4.8 oz) 122.9 kg (271 lb) 124.3 kg (274 lb)    Examination:  General exam: Pleasant young male, moderately built and nourished lying comfortably propped up in bed.  Does not look septic or toxic. respiratory system: Clear to auscultation.  No increased work of breathing.  Stable. cardiovascular  system: S1 and S2 heard, RRR.  No JVD, murmurs or pedal edema.  Telemetry personally reviewed: Sinus rhythm with BBB morphology. gastrointestinal system: Abdomen is obese, nondistended, soft and nontender. Normal bowel sounds heard.  Stable. Extremities: Moves all limbs symmetrically.  Slight warmth, mild tenderness and painful range of movements of small joints of his hands, bilateral wrists and knees.  Continues to improve. CNS: Alert and oriented.  No focal neurological deficits. Skin: Large right gluteal wound status post I&D with bleeding but appears clean.  As per  CCS, no splitting vessels.  Wound was packed with gauze.  Large mid sacral wound with some slough at the base but otherwise clean and no bleeding.  Please see pictures below taken 4/6.  This was as examined on 4/6.   Right gluteal wound post I&D.  06/19/17.   Mid sacral wound.  06/19/17.   Data Reviewed: I have personally reviewed following labs and imaging studies  CBC: Recent Labs  Lab 06/17/17 0351 06/18/17 0317 06/19/17 0251 06/19/17 1647 06/20/17 0254 06/21/17 0326  WBC 18.4* 30.9* 30.9* 31.5* 29.2* 26.6*  NEUTROABS 15.2* 26.5* 28.5*  --  27.1* 23.9*  HGB 7.8* 6.6* 7.1* 7.3* 7.3* 7.1*  HCT 25.4* 20.5* 22.0* 23.5* 23.3* 22.4*  MCV 87.3 90.3 88.7 85.8 86.0 83.9  PLT 521* 452* 460* 427* 413* 127*   Basic Metabolic Panel: Recent Labs  Lab 06/16/17 0259 06/17/17 0351 06/18/17 0317 06/19/17 0251 06/20/17 0254 06/21/17 0326  NA 134* 135 135 135 133*  --   K 5.1 3.6 3.5 4.2 3.3*  --   CL 96* 96* 97* 97* 94*  --   CO2 22 25 24 22 22   --   GLUCOSE 152* 115* 116* 109* 185*  --   BUN 28* 35* 16 24* 18  --   CREATININE 4.85* 6.25* 4.26* 5.51* 4.21*  --   CALCIUM 7.9* 7.8* 7.7* 8.1* 7.9*  --   MG 1.9 1.9 1.8 1.9 1.9 2.0   GFR: Estimated Creatinine Clearance: 30.5 mL/min (A) (by C-G formula based on SCr of 4.21 mg/dL (H)). Liver Function Tests: Recent Labs  Lab 06/15/17 0240  AST 23  ALT 16*  ALKPHOS 96  BILITOT 0.8  PROT 5.7*  ALBUMIN 1.7*    No results for input(s): AMMONIA in the last 168 hours. CBG: Recent Labs  Lab 06/20/17 1655 06/20/17 2132 06/21/17 0801 06/21/17 1250 06/21/17 1813  GLUCAP 167* 142* 168* 107* 128*   Sepsis Labs: No results for input(s): PROCALCITON, LATICACIDVEN in the last 168 hours.  Recent Results (from the past 240 hour(s))  Culture, blood (routine x 2)     Status: None   Collection Time: 06/13/17 11:26 AM  Result Value Ref Range Status   Specimen Description BLOOD RIGHT HAND  Final   Special Requests   Final    IN BOTH  AEROBIC AND ANAEROBIC BOTTLES Blood Culture adequate volume   Culture   Final    NO GROWTH 5 DAYS Performed at [facility], [address], [place], [place] [address]    Report Status 06/18/2017 FINAL  Final  Culture, blood (routine x 2)     Status: Abnormal   Collection Time: 06/13/17 11:26 AM  Result Value Ref Range Status   Specimen Description BLOOD RIGHT HAND  Final   Special Requests   Final    IN BOTH AEROBIC AND ANAEROBIC BOTTLES Blood Culture adequate volume   Culture  Setup Time   Final    GRAM  POSITIVE COCCI ANAEROBIC BOTTLE ONLY CRITICAL RESULT CALLED TO, READ BACK BY AND VERIFIED WITH: Karlene Einstein PharmD 14:40 06/14/17 ([email or web address])    Culture (A)  Final    STAPHYLOCOCCUS SPECIES (COAGULASE NEGATIVE) THE SIGNIFICANCE OF ISOLATING THIS ORGANISM FROM A SINGLE SET OF BLOOD CULTURES WHEN MULTIPLE SETS ARE DRAWN IS UNCERTAIN. PLEASE NOTIFY THE MICROBIOLOGY DEPARTMENT WITHIN ONE WEEK IF SPECIATION AND SENSITIVITIES ARE REQUIRED. Performed at [facility], [place] [address] [place], [place] [address]    Report Status 06/16/2017 FINAL  Final  Blood Culture ID Panel (Reflexed)     Status: Abnormal   Collection Time: 06/13/17 11:26 AM  Result Value Ref Range Status   Enterococcus species NOT DETECTED NOT DETECTED Final   Listeria monocytogenes NOT DETECTED NOT DETECTED Final   Staphylococcus species DETECTED (A) NOT DETECTED Final    Comment: Methicillin (oxacillin) resistant coagulase negative staphylococcus. Possible blood culture contaminant (unless isolated from more than one blood culture draw or clinical case suggests pathogenicity). No antibiotic treatment is indicated for blood  culture contaminants. CRITICAL RESULT CALLED TO, READ BACK BY AND VERIFIED WITH: Karlene Einstein PharmD 14:40 06/14/17 ([email or web address])    Staphylococcus aureus NOT DETECTED NOT DETECTED Final   Methicillin resistance DETECTED (A) NOT DETECTED Final    Comment: CRITICAL RESULT CALLED TO, READ BACK  BY AND VERIFIED WITH: Karlene Einstein PharmD 14:40 06/14/17 ([email or web address])    Streptococcus species NOT DETECTED NOT DETECTED Final   Streptococcus agalactiae NOT DETECTED NOT DETECTED Final   Streptococcus pneumoniae NOT DETECTED NOT DETECTED Final   Streptococcus pyogenes NOT DETECTED NOT DETECTED Final   Acinetobacter baumannii NOT DETECTED NOT DETECTED Final   Enterobacteriaceae species NOT DETECTED NOT DETECTED Final   Enterobacter cloacae complex NOT DETECTED NOT DETECTED Final   Escherichia coli NOT DETECTED NOT DETECTED Final   Klebsiella oxytoca NOT DETECTED NOT DETECTED Final   Klebsiella pneumoniae NOT DETECTED NOT DETECTED Final   Proteus species NOT DETECTED NOT DETECTED Final   Serratia marcescens NOT DETECTED NOT DETECTED Final   Haemophilus influenzae NOT DETECTED NOT DETECTED Final   Neisseria meningitidis NOT DETECTED NOT DETECTED Final   Pseudomonas aeruginosa NOT DETECTED NOT DETECTED Final   Candida albicans NOT DETECTED NOT DETECTED Final   Candida glabrata NOT DETECTED NOT DETECTED Final   Candida krusei NOT DETECTED NOT DETECTED Final   Candida parapsilosis NOT DETECTED NOT DETECTED Final   Candida tropicalis NOT DETECTED NOT DETECTED Final    Comment: Performed at [facility], [place]. [address], [place], [place] [address]  Aerobic/Anaerobic Culture (surgical/deep wound)     Status: None   Collection Time: 06/14/17  7:52 PM  Result Value Ref Range Status   Specimen Description ABSCESS GLUTEAL  Final   Special Requests PATIENT ON FOLLOWING ZOSYN AND VANC  Final   Gram Stain   Final    FEW WBC PRESENT, PREDOMINANTLY PMN FEW GRAM POSITIVE COCCI FEW GRAM NEGATIVE RODS RARE GRAM POSITIVE RODS Performed at [facility], [place] [address], [place], [place] [address]    Culture   Final    FEW MORGANELLA MORGANII MIXED ANAEROBIC FLORA PRESENT.  CALL LAB IF FURTHER IID REQUIRED.    Report Status 06/17/2017 FINAL  Final   Organism ID, Bacteria MORGANELLA MORGANII   Final      Susceptibility   Morganella morganii - MIC*    AMPICILLIN >=32 RESISTANT Resistant     CEFAZOLIN >=64 RESISTANT Resistant     CEFEPIME <=1 SENSITIVE Sensitive  CEFTAZIDIME <=1 SENSITIVE Sensitive     CEFTRIAXONE <=1 SENSITIVE Sensitive     CIPROFLOXACIN <=0.25 SENSITIVE Sensitive     GENTAMICIN <=1 SENSITIVE Sensitive     IMIPENEM 1 SENSITIVE Sensitive     TRIMETH/SULFA <=20 SENSITIVE Sensitive     AMPICILLIN/SULBACTAM 16 INTERMEDIATE Intermediate     PIP/TAZO <=4 SENSITIVE Sensitive     * FEW MORGANELLA MORGANII  MRSA PCR Screening     Status: None   Collection Time: 06/15/17 12:48 AM  Result Value Ref Range Status   MRSA by PCR NEGATIVE NEGATIVE Final    Comment:        The GeneXpert MRSA Assay (FDA approved for NASAL specimens only), is one component of a comprehensive MRSA colonization surveillance program. It is not intended to diagnose MRSA infection nor to guide or monitor treatment for MRSA infections. Performed at [facility], [place] [address] [place], [place] [address]   Culture, blood (routine x 2)     Status: None   Collection Time: 06/16/17 10:01 AM  Result Value Ref Range Status   Specimen Description BLOOD RIGHT ANTECUBITAL  Final   Special Requests   Final    BOTTLES DRAWN AEROBIC AND ANAEROBIC Blood Culture results may not be optimal due to an excessive volume of blood received in culture bottles   Culture   Final    NO GROWTH 5 DAYS Performed at [facility], [place] [address] [place], [place] [address]    Report Status 06/21/2017 FINAL  Final  Culture, blood (routine x 2)     Status: None   Collection Time: 06/16/17 10:01 AM  Result Value Ref Range Status   Specimen Description BLOOD RIGHT HAND  Final   Special Requests   Final    BOTTLES DRAWN AEROBIC AND ANAEROBIC Blood Culture results may not be optimal due to an inadequate volume of blood received in culture bottles   Culture   Final    NO GROWTH 5 DAYS Performed at  [facility], [place] [address] [place], [place] [address]    Report Status 06/21/2017 FINAL  Final         Radiology Studies: Mr Lumbar Spine Wo Contrast  Result Date: 06/21/2017 CLINICAL DATA:  Fever of unknown origin in a diabetic patient. Skin ulceration over the coccyx. EXAM: MRI LUMBAR SPINE WITHOUT CONTRAST TECHNIQUE: Multiplanar, multisequence MR imaging of the lumbar spine was performed. No intravenous contrast was administered. COMPARISON:  MRI lumbar spine 04/22/2011. FINDINGS: Segmentation:  Standard. Alignment:  Maintained. Vertebrae: No fracture or worrisome lesion. Please note that the coccyx is not included on this examination. No evidence of discitis or osteomyelitis is seen. Conus medullaris and cauda equina: Conus extends to the L2 level. Conus and cauda equina appear normal. Paraspinal and other soft tissues: There is partial visualization of a decubitus ulcer over the coccyx. Disc levels: T11-12 and T12-L1 are imaged in the sagittal plane only and negative. L1-2: Negative. L2-3: Mild facet degenerative change.  Otherwise negative. L3-4: Mild facet degenerative change.  Otherwise negative. L4-5: Very shallow disc bulge without central canal stenosis. There is a small protrusion in the right foramen causing mild foraminal narrowing. The left foramen is open. L5-S1: Very shallow right paracentral protrusion without stenosis. IMPRESSION: Partial visualization of a decubitus ulcer over the coccyx. Negative for discitis or osteomyelitis. Please note that the coccyx is not included on this lumbar spine MRI. Mild right foraminal narrowing L4-5 due to a small disc protrusion.  Electronically Signed   By: Inge Rise M.D.   On: 06/21/2017 12:17        Scheduled Meds: . amiodarone  200 mg Oral Daily  . calcitRIOL  0.5 mcg Oral Q T,Th,Sat-1800  . darbepoetin (ARANESP) injection - DIALYSIS  150 mcg Intravenous Q Thu-HD  . insulin aspart  0-15 Units Subcutaneous TID WC  .  insulin aspart  0-5 Units Subcutaneous QHS  . insulin aspart  5 Units Subcutaneous TID WC  . insulin glargine  25 Units Subcutaneous QHS  . predniSONE  30 mg Oral Q breakfast  . sodium chloride flush  3 mL Intravenous Q12H  . sodium hypochlorite   Irrigation BID   Continuous Infusions: . sodium chloride    . cefTAZidime (FORTAZ)  IV Stopped (06/19/17 1113)     LOS: 13 days        Vernell Leep, MD, FACP, [facility]. [facility] Pager [phone]  If 7PM-7AM, please contact night-coverage [email or web address] Password [facility] 06/21/2017, 6:46 PM

## 2017-06-21 NOTE — Progress Notes (Signed)
7 Days Post-Op  Subjective: Patient with no complaints this morning.  Stools are improving.  c diff pending.  Objective: Vital signs in last 24 hours: Temp:  [97.7 F (36.5 C)-97.9 F (36.6 C)] 97.8 F (36.6 C) (04/08 0518) Pulse Rate:  [80-87] 87 (04/08 0518) Resp:  [16-18] 18 (04/08 0518) BP: (124-139)/(72-80) 139/80 (04/08 0518) SpO2:  [94 %-100 %] 94 % (04/08 0518) Weight:  [274 lb (124.3 kg)] 274 lb (124.3 kg) (04/08 0518) Last BM Date: 06/18/17  Intake/Output from previous day: 04/07 0701 - 04/08 0700 In: 120 [P.O.:120] Out: -  Intake/Output this shift: No intake/output data recorded.  PE: Skin: sacral and right gluteal wounds both with green drainage on their dressing.  No further bleeding at this time from right gluteal wound.  Both wounds are relatively clean.  Sacral wound has a small amount of fibrin at the base as well as I can see for turning him by myself.  Lab Results:  Recent Labs    06/20/17 0254 06/21/17 0326  WBC 29.2* 26.6*  HGB 7.3* 7.1*  HCT 23.3* 22.4*  PLT 413* 453*   BMET Recent Labs    06/19/17 0251 06/20/17 0254  NA 135 133*  K 4.2 3.3*  CL 97* 94*  CO2 22 22  GLUCOSE 109* 185*  BUN 24* 18  CREATININE 5.51* 4.21*  CALCIUM 8.1* 7.9*   PT/INR No results for input(s): LABPROT, INR in the last 72 hours. CMP     Component Value Date/Time   NA 133 (L) 06/20/2017 0254   K 3.3 (L) 06/20/2017 0254   CL 94 (L) 06/20/2017 0254   CO2 22 06/20/2017 0254   GLUCOSE 185 (H) 06/20/2017 0254   BUN 18 06/20/2017 0254   CREATININE 4.21 (H) 06/20/2017 0254   CALCIUM 7.9 (L) 06/20/2017 0254   PROT 5.7 (L) 06/15/2017 0240   ALBUMIN 1.7 (L) 06/15/2017 0240   AST 23 06/15/2017 0240   ALT 16 (L) 06/15/2017 0240   ALKPHOS 96 06/15/2017 0240   BILITOT 0.8 06/15/2017 0240   GFRNONAA 16 (L) 06/20/2017 0254   GFRAA 18 (L) 06/20/2017 0254   Lipase  No results found for: LIPASE     Studies/Results: No results  found.  Anti-infectives: Anti-infectives (From admission, onward)   Start     Dose/Rate Route Frequency Ordered Stop   06/17/17 1200  cefTAZidime (FORTAZ) 2 g in sodium chloride 0.9 % 100 mL IVPB     2 g 200 mL/hr over 30 Minutes Intravenous Every T-Th-Sa (Hemodialysis) 06/16/17 1001     06/17/17 0837  vancomycin (VANCOCIN) 1-5 GM/200ML-% IVPB    Note to Pharmacy:  Louretta Shorten   : cabinet override      06/17/17 0837 06/17/17 1123   06/16/17 1100  cefTAZidime (FORTAZ) 2 g in sodium chloride 0.9 % 100 mL IVPB     2 g 200 mL/hr over 30 Minutes Intravenous  Once 06/16/17 1001 06/16/17 1137   06/15/17 1502  vancomycin (VANCOCIN) 1-5 GM/200ML-% IVPB    Note to Pharmacy:  Herriott, Melisa   : cabinet override      06/15/17 1502 06/15/17 1553   06/15/17 1200  vancomycin (VANCOCIN) IVPB 1000 mg/200 mL premix  Status:  Discontinued     1,000 mg 200 mL/hr over 60 Minutes Intravenous Every T-Th-Sa (Hemodialysis) 06/14/17 1256 06/17/17 1153   06/14/17 1330  vancomycin (VANCOCIN) 2,000 mg in sodium chloride 0.9 % 500 mL IVPB     2,000 mg 250 mL/hr  over 120 Minutes Intravenous  Once 06/14/17 1256 06/14/17 2229   06/14/17 1130  piperacillin-tazobactam (ZOSYN) IVPB 3.375 g  Status:  Discontinued     3.375 g 12.5 mL/hr over 240 Minutes Intravenous Every 12 hours 06/14/17 1057 06/16/17 0936       Assessment/Plan AF/DVT - on Eliquis DCHF/NICM ESRD HD - TTS Cardiorenal syndrome Diabetes ID - peripheral neuropathy Hypertension RA - on steroids Deconditioning- cardiorenal syndrome Anemia- received 1 uPRBC 4/5 and 1 unit 4/6, holding eliquis, hg now stable at 7.1   Sacral ulcer S/pDEBRIDMENT OF SACRAL DECUBITUS ULCER [id number] Dr. Melanee Spry -some green drainage on dressing.  Will add Dakin's for 3 days.  Cont hydrotherapy Right gluteal abscess S/pDebridement of 70cm^2 tissue involving subcutaneous tissue, fascia and muscle of right gluteal area, 06/14/17 Dr. Gurney Maxin -  culture: [facility] MORGANII - per ID will likely require at least 6 weeks of antibiotics with dialysis for the sacral osteomyelitis -some green drainage on dressing c/w possible pseudomonas.  Will add 3 days of Dakin's to dressing changes, then switch back to NS dressing changes  ID -fortaz 4/3>>,Vancomycin4/1>>4/4;Zosyn 4/1>>4/3;ancef perioperative3/22 FEN -renal diet VTE -SCDs, holding eliquis due to ABL anemia Foley -none  Plan: Bleeding from right gluteal wound requiring transfusion 4/5 and 4/6, no evidence of bleeding today. Start BID dressing changes again with Dakin's for 3 days and then switch back to NS. -cont to hold eliquis today, but likely can resume soon if no issues.     LOS: 13 days    Henreitta Cea , [facility] 06/21/2017, 9:03 AM Pager: [phone]

## 2017-06-22 ENCOUNTER — Inpatient Hospital Stay (HOSPITAL_COMMUNITY): Admit: 2017-06-22 | Payer: BLUE CROSS/BLUE SHIELD

## 2017-06-22 ENCOUNTER — Encounter: Payer: BLUE CROSS/BLUE SHIELD | Admitting: Vascular Surgery

## 2017-06-22 DIAGNOSIS — Z9889 Other specified postprocedural states: Secondary | ICD-10-CM

## 2017-06-22 LAB — CBC
HCT: 20.9 % — ABNORMAL LOW (ref 39.0–52.0)
Hemoglobin: 6.9 g/dL — CL (ref 13.0–17.0)
MCH: 27.8 pg (ref 26.0–34.0)
MCHC: 33 g/dL (ref 30.0–36.0)
MCV: 84.3 fL (ref 78.0–100.0)
PLATELETS: 468 10*3/uL — AB (ref 150–400)
RBC: 2.48 MIL/uL — ABNORMAL LOW (ref 4.22–5.81)
RDW: 19.5 % — AB (ref 11.5–15.5)
WBC: 24.5 10*3/uL — AB (ref 4.0–10.5)

## 2017-06-22 LAB — RENAL FUNCTION PANEL
Albumin: 1.7 g/dL — ABNORMAL LOW (ref 3.5–5.0)
Anion gap: 13 (ref 5–15)
BUN: 52 mg/dL — AB (ref 6–20)
CHLORIDE: 97 mmol/L — AB (ref 101–111)
CO2: 24 mmol/L (ref 22–32)
CREATININE: 6.75 mg/dL — AB (ref 0.61–1.24)
Calcium: 8.4 mg/dL — ABNORMAL LOW (ref 8.9–10.3)
GFR calc Af Amer: 10 mL/min — ABNORMAL LOW (ref >60)
GFR, EST NON AFRICAN AMERICAN: 9 mL/min — AB (ref >60)
GLUCOSE: 142 mg/dL — AB (ref 65–99)
Phosphorus: 5.5 mg/dL — ABNORMAL HIGH (ref 2.5–4.6)
Potassium: 3.1 mmol/L — ABNORMAL LOW (ref 3.5–5.1)
Sodium: 134 mmol/L — ABNORMAL LOW (ref 135–145)

## 2017-06-22 LAB — GLUCOSE, CAPILLARY
Glucose-Capillary: 84 mg/dL (ref 65–99)
Glucose-Capillary: 110 mg/dL — ABNORMAL HIGH (ref 65–99)
Glucose-Capillary: 210 mg/dL — ABNORMAL HIGH (ref 65–99)

## 2017-06-22 MED ORDER — POTASSIUM CHLORIDE CRYS ER 20 MEQ PO TBCR
40.0000 meq | EXTENDED_RELEASE_TABLET | Freq: Every day | ORAL | Status: DC
  Administered 2017-06-22: 40 meq via ORAL
  Filled 2017-06-22 (×2): qty 2

## 2017-06-22 MED ORDER — LANTHANUM CARBONATE 500 MG PO CHEW
250.0000 mg | CHEWABLE_TABLET | Freq: Three times a day (TID) | ORAL | Status: DC
  Administered 2017-06-22 – 2017-07-04 (×26): 250 mg via ORAL
  Filled 2017-06-22 (×30): qty 1

## 2017-06-22 NOTE — Consult Note (Signed)
[place] Nurse wound consult note Reason for Consult: Follow up for sacral and right buttock/hip wounds Wound type: Sacral is a pressure injury/abscess site.  Right buttock is a surgical site Pressure Injury POA: Yes Measurement: Please see PT hydrotherapy notes from today for wound measurements and specifics. Wound bed: Right buttock/hip wound is clean, dark pink granulation tissue.  There is an area at the superior margin of the wound that is darkened, and I presume this may be the area that bled previously.  There was no bleeding today from either site when the packing was removed.  This area is not receiving hydrotherapy.  The dressing change for this area is Dakin's solution twice daily for both wounds.  The sacral wound has clean granulation tissue and deep in the wound there is an area of darkened, necrotic tissue. Periwound: I did not see any epidermal stripping to the skin from tape injury to the skin immediately surrounding the wound sites.  The patient tolerated the tape and packing removal without difficulty. Dressing procedure/placement/frequency: Please see surgical orders for specifics. The patient needs to be offered pain medication one hour prior to dressing changes.  He received pain medication prior to this treatment and appeared to be tolerating the procedure well. Also, there are Prevalon boots in the patient's room.  He needs to be wearing these when he is in the bed.   Discussed plan of care with the patient and bedside nurse, PT and PTA.  Val Riles, RN, MSN, CWOCN, CNS-BC, pager [phone]

## 2017-06-22 NOTE — Progress Notes (Signed)
[facility] for Infectious Disease  Date of Admission:  06/08/2017     Total antibiotics 3/42         ASSESSMENT/PLAN  John Bailey has remained fever free for the last 48 hours with his leukocytosis remaining stable. He is tolerating ceftazidime with dialysis and unlikely the cause of his fevers. He does continue to have blood loss associated with his wound requiring transfusions. Per nephrology the fistula is progressing slowly and there is continued need for the dialysis catheter. There does not appear to be any evidence of catheter infection at present time. He has back to his normal bowel movement pattern with no evidence of C. Diff at this time.   1. Continue ceftazidime.  2. Continue wound care and hydrotherapy per general surgery.  3. We will continue to follow fever curve to ensure resolution.  4. Will discontinue C. Diff PCR and enteric precautions as there is no evidence of C. Diff clinically.    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 amiodarone  200 mg Oral Daily  . calcitRIOL  0.5 mcg Oral Q T,Th,Sat-1800  . darbepoetin (ARANESP) injection - DIALYSIS  150 mcg Intravenous Q Thu-HD  . insulin aspart  0-15 Units Subcutaneous TID WC  . insulin aspart  0-5 Units Subcutaneous QHS  . insulin aspart  5 Units Subcutaneous TID WC  . insulin glargine  25 Units  Subcutaneous QHS  . lanthanum  250 mg Oral TID WC  . potassium chloride  40 mEq Oral Daily  . predniSONE  30 mg Oral Q breakfast  . sodium chloride flush  3 mL Intravenous Q12H  . sodium hypochlorite   Irrigation BID    SUBJECTIVE:  Afebrile overnight and tolerating antibiotic well. Express itchiness located around the round and rectum which he thinks may be related to the tape. Denies fevers, chills or night sweats.   No Known Allergies   Review of Systems: Review of Systems  Constitutional: Negative for chills and fever.  Respiratory: Negative for cough, hemoptysis, sputum production and shortness of breath.   Cardiovascular: Negative for chest pain.  Gastrointestinal: Negative for abdominal pain, constipation, diarrhea, nausea and vomiting.  Skin: Negative for rash.      OBJECTIVE: Vitals:   06/22/17 1045 06/22/17 1100 06/22/17 1101 06/22/17 1116  BP: (!) 120/54 112/60 112/60 (!) 116/55  Pulse: 84 83  83  Resp: 18 (!) 27 15 20   Temp: 98.2 F (36.8 C) 98.2 F (36.8 C) 98.2 F (36.8 C) 98.2 F (36.8 C)  TempSrc: Oral Oral  Oral  SpO2:      Weight:      Height:       Body mass index is 37.3 kg/m.  Physical Exam  Constitutional: He is oriented to person, place, and time and well-developed, well-nourished, and in no distress. No distress.  Cardiovascular: Normal rate, regular rhythm, normal heart sounds and intact distal  pulses. Exam reveals no gallop and no friction rub.  No murmur heard. Pulmonary/Chest: Effort normal and breath sounds normal. No respiratory distress. He has no wheezes. He has no rales. He exhibits no tenderness.  Abdominal: Soft. Bowel sounds are normal. He exhibits no distension.  Neurological: He is alert and oriented to person, place, and time.  Skin: Skin is warm and dry. No rash noted.  Dressing is clean, dry and intact.   Psychiatric: Affect and judgment normal.    Lab Results Lab Results  Component Value Date   WBC 24.5 (H)  06/22/2017   HGB 6.9 (LL) 06/22/2017   HCT 20.9 (L) 06/22/2017   MCV 84.3 06/22/2017   PLT 468 (H) 06/22/2017    Lab Results  Component Value Date   CREATININE 6.75 (H) 06/22/2017   BUN 52 (H) 06/22/2017   NA 134 (L) 06/22/2017   K 3.1 (L) 06/22/2017   CL 97 (L) 06/22/2017   CO2 24 06/22/2017    Lab Results  Component Value Date   ALT 16 (L) 06/15/2017   AST 23 06/15/2017   ALKPHOS 96 06/15/2017   BILITOT 0.8 06/15/2017     Microbiology: Recent Results (from the past 240 hour(s))  Culture, blood (routine x 2)     Status: None   Collection Time: 06/13/17 11:26 AM  Result Value Ref Range Status   Specimen Description BLOOD RIGHT HAND  Final   Special Requests   Final    IN BOTH AEROBIC AND ANAEROBIC BOTTLES Blood Culture adequate volume   Culture   Final    NO GROWTH 5 DAYS Performed at [facility], [place] [address] [place], [place] [address]    Report Status 06/18/2017 FINAL  Final  Culture, blood (routine x 2)     Status: Abnormal   Collection Time: 06/13/17 11:26 AM  Result Value Ref Range Status   Specimen Description BLOOD RIGHT HAND  Final   Special Requests   Final    IN BOTH AEROBIC AND ANAEROBIC BOTTLES Blood Culture adequate volume   Culture  Setup Time   Final    GRAM POSITIVE COCCI ANAEROBIC BOTTLE ONLY CRITICAL RESULT CALLED TO, READ BACK BY AND VERIFIED WITH: Karlene Einstein PharmD 14:40 06/14/17 ([email or web address])    Culture (A)  Final    STAPHYLOCOCCUS SPECIES (COAGULASE NEGATIVE) THE SIGNIFICANCE OF ISOLATING THIS ORGANISM FROM A SINGLE SET OF BLOOD CULTURES WHEN MULTIPLE SETS ARE DRAWN IS UNCERTAIN. PLEASE NOTIFY THE MICROBIOLOGY DEPARTMENT WITHIN ONE WEEK IF SPECIATION AND SENSITIVITIES ARE REQUIRED. Performed at [facility], [place] [address] [place], [place] [address]    Report Status 06/16/2017 FINAL  Final  Blood Culture ID Panel (Reflexed)     Status: Abnormal   Collection Time: 06/13/17 11:26 AM  Result Value Ref Range Status   Enterococcus  species NOT DETECTED NOT DETECTED Final   Listeria monocytogenes NOT DETECTED NOT DETECTED Final   Staphylococcus species DETECTED (A) NOT DETECTED Final    Comment: Methicillin (oxacillin) resistant coagulase negative staphylococcus. Possible blood culture contaminant (unless isolated from more than one blood culture draw or clinical case suggests pathogenicity). No antibiotic treatment is indicated for blood  culture contaminants. CRITICAL RESULT CALLED TO, READ BACK BY AND VERIFIED WITH: Karlene Einstein PharmD 14:40 06/14/17 ([email or web address])    Staphylococcus aureus NOT DETECTED NOT DETECTED Final   Methicillin resistance DETECTED (A) NOT DETECTED Final    Comment: CRITICAL RESULT CALLED TO, READ BACK BY AND VERIFIED WITH: Karlene Einstein PharmD 14:40  06/14/17 ([email or web address])    Streptococcus species NOT DETECTED NOT DETECTED Final   Streptococcus agalactiae NOT DETECTED NOT DETECTED Final   Streptococcus pneumoniae NOT DETECTED NOT DETECTED Final   Streptococcus pyogenes NOT DETECTED NOT DETECTED Final   Acinetobacter baumannii NOT DETECTED NOT DETECTED Final   Enterobacteriaceae species NOT DETECTED NOT DETECTED Final   Enterobacter cloacae complex NOT DETECTED NOT DETECTED Final   Escherichia coli NOT DETECTED NOT DETECTED Final   Klebsiella oxytoca NOT DETECTED NOT DETECTED Final   Klebsiella pneumoniae NOT DETECTED NOT DETECTED Final   Proteus species NOT DETECTED NOT DETECTED Final   Serratia marcescens NOT DETECTED NOT DETECTED Final   Haemophilus influenzae NOT DETECTED NOT DETECTED Final   Neisseria meningitidis NOT DETECTED NOT DETECTED Final   Pseudomonas aeruginosa NOT DETECTED NOT DETECTED Final   Candida albicans NOT DETECTED NOT DETECTED Final   Candida glabrata NOT DETECTED NOT DETECTED Final   Candida krusei NOT DETECTED NOT DETECTED Final   Candida parapsilosis NOT DETECTED NOT DETECTED Final   Candida tropicalis NOT DETECTED NOT DETECTED Final    Comment: Performed at [facility], [place] [address] [place], [place] [address]  Aerobic/Anaerobic Culture (surgical/deep wound)     Status: None   Collection Time: 06/14/17  7:52 PM  Result Value Ref Range Status   Specimen Description ABSCESS GLUTEAL  Final   Special Requests PATIENT ON FOLLOWING ZOSYN AND VANC  Final   Gram Stain   Final    FEW WBC PRESENT, PREDOMINANTLY PMN FEW GRAM POSITIVE COCCI FEW GRAM NEGATIVE RODS RARE GRAM POSITIVE RODS Performed at [facility], [place] [address], [place], [place] [address]    Culture   Final    FEW MORGANELLA MORGANII MIXED ANAEROBIC FLORA PRESENT.  CALL LAB IF FURTHER IID REQUIRED.    Report Status 06/17/2017 FINAL  Final   Organism ID, Bacteria MORGANELLA MORGANII  Final      Susceptibility   Morganella morganii - MIC*    AMPICILLIN >=32 RESISTANT Resistant     CEFAZOLIN >=64 RESISTANT Resistant     CEFEPIME <=1 SENSITIVE Sensitive     CEFTAZIDIME <=1 SENSITIVE Sensitive     CEFTRIAXONE <=1 SENSITIVE Sensitive     CIPROFLOXACIN <=0.25 SENSITIVE Sensitive     GENTAMICIN <=1 SENSITIVE Sensitive     IMIPENEM 1 SENSITIVE Sensitive     TRIMETH/SULFA <=20 SENSITIVE Sensitive     AMPICILLIN/SULBACTAM 16 INTERMEDIATE Intermediate     PIP/TAZO <=4 SENSITIVE Sensitive     * FEW MORGANELLA MORGANII  MRSA PCR Screening     Status: None   Collection Time: 06/15/17 12:48 AM  Result Value Ref Range Status   MRSA by PCR NEGATIVE NEGATIVE Final    Comment:        The GeneXpert MRSA Assay (FDA approved for NASAL specimens only), is one component of a comprehensive MRSA colonization surveillance program. It is not intended to diagnose MRSA infection nor to guide or monitor treatment for MRSA infections. Performed at [facility], [place] [address] [place], [place] [address]   Culture, blood (routine x 2)     Status: None   Collection Time: 06/16/17 10:01 AM  Result Value Ref Range Status   Specimen Description BLOOD RIGHT ANTECUBITAL  Final   Special  Requests   Final    BOTTLES DRAWN AEROBIC AND ANAEROBIC Blood Culture results may not be optimal due to an excessive volume of blood received in culture bottles   Culture  Final    NO GROWTH 5 DAYS Performed at [facility], [place] [address], [place], [place] [address]    Report Status 06/21/2017 FINAL  Final  Culture, blood (routine x 2)     Status: None   Collection Time: 06/16/17 10:01 AM  Result Value Ref Range Status   Specimen Description BLOOD RIGHT HAND  Final   Special Requests   Final    BOTTLES DRAWN AEROBIC AND ANAEROBIC Blood Culture results may not be optimal due to an inadequate volume of blood received in culture bottles   Culture   Final    NO GROWTH 5 DAYS Performed at [facility], [place] [address] [place], [place] [address]    Report Status 06/21/2017 FINAL  Final     Terri Piedra, NP [facility] [phone] Pager  06/22/2017  11:41 AM

## 2017-06-22 NOTE — Procedures (Signed)
Patient seen on Hemodialysis. QB 400, UF goal 2.5L Treatment adjusted as needed.  John Shiley MD [facility]. Office # [phone] Pager # [phone] 9:21 AM

## 2017-06-22 NOTE — Progress Notes (Signed)
Physical Therapy Wound Treatment Patient Details  Name: John Bailey MRN: [id number] Date of Birth: 16-Aug-1970  Today's Date: 06/22/2017 Time: 1320-1420 Time Calculation (min): 60 min  Subjective  Subjective: Pt frustrated that he is still not able to go home Patient and Family Stated Goals: heal wounds  Prior Treatments: Surgical I&D 06/04/17 and 06/14/17  Pain Score: Pt premedicated prior to treatment. Pt reports 8/10 pain with removal of dressing and packing of wounds.  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 after surgery on 4/1 (Active)  Dressing Type ABD;Gauze (Comment);Moist to dry;Barrier Film (skin prep) 06/22/2017  5:00 PM  Dressing Changed 06/22/2017  5:00 PM  Dressing Change Frequency Twice a day 06/22/2017  5:00 PM  State of Healing Early/partial granulation 06/22/2017  5:00 PM  Site / Wound Assessment Pink;Yellow;Painful;Granulation tissue 06/22/2017  5:00 PM  % Wound base Red or Granulating 70% 06/22/2017  5:00 PM  % Wound base Yellow/Fibrinous Exudate 30% 06/22/2017  5:00 PM  Peri-wound Assessment Pink;Intact 06/16/2017  7:40 AM  Wound Length (cm) 8.5 cm 06/17/2017  4:00 PM  Wound Width (cm) 6 cm 06/17/2017  4:00 PM  Wound Depth (cm) 5 cm 06/17/2017  4:00 PM  Wound Surface Area (cm^2) 51 cm^2 06/17/2017  4:00 PM  Wound Volume (cm^3) 255 cm^3 06/17/2017  4:00 PM  Tunneling (cm) 4 cm 3-4 o'clock 06/17/2017  4:00 PM  Undermining (cm) 1 cm 12-3 o'clock  06/17/2017  4:00 PM  Margins Unattached edges (unapproximated) 06/22/2017  5:00 PM  Drainage Amount Minimal 06/22/2017  5:00 PM  Drainage Description Serosanguineous 06/22/2017  5:00 PM  Treatment Debridement (Selective);Hydrotherapy (Pulse lavage);Packing (Saline gauze) 06/22/2017  5:00 PM   Packed with Dakins soaked Kerlix     Wound / Incision (Open or Dehisced) 06/17/17 Incision - Open Buttocks Right;Lateral R gluteal surgical debridement of abcess   (Active)  Dressing Type ABD;Gauze (Comment);Silver hydrofiber 06/22/2017  5:00 PM  Dressing Changed Changed 06/22/2017  5:00 PM  Dressing Status New drainage 06/22/2017  5:00 PM  Dressing Change Frequency Daily 06/22/2017  5:00 PM  Site / Wound Assessment Red;Granulation tissue 06/22/2017  5:00 PM  % Wound base Red or Granulating 100% 06/22/2017  5:00 PM  Wound Length (cm) 9 cm 06/17/2017  4:00 PM  Wound Width (cm) 7 cm 06/17/2017  4:00 PM  Wound Depth (cm) 5 cm 06/17/2017  4:00 PM  Wound Volume (cm^3) 315 cm^3 06/17/2017  4:00 PM  Wound Surface Area (cm^2) 63 cm^2 06/17/2017  4:00 PM  Closure None 06/22/2017  5:00 PM  Drainage Amount Moderate 06/22/2017  5:00 PM  Drainage Description Serosanguineous;Green 06/22/2017  5:00 PM  Treatment Packing (Saline gauze) 06/22/2017  5:00 PM   Packed with Dakins soaked Kerlix   Hydrotherapy Pulsed lavage therapy - wound location: sacrum Pulsed Lavage with Suction (psi): 4 psi(4 psi due to increased pain) Pulsed Lavage with Suction - Normal Saline Used: 1000 mL Pulsed Lavage Tip: Tip with splash shield Selective Debridement Selective Debridement - Location: sacrum  Selective Debridement - Tools Used: Forceps;Scissors Selective Debridement - Tissue Removed: brown, gray yellow, necrotic tissue.    Wound Assessment and Plan  Wound Therapy - Assess/Plan/Recommendations Wound Therapy - Clinical Statement: Continue to debride necrotic tissue from the tunnel at 4-5 o'clock as well as fibrinous base of the wound. Factors Delaying/Impairing Wound Healing: Immobility;Multiple medical problems;Diabetes Mellitus Hydrotherapy Plan: Pulsatile lavage with suction;Patient/family education;Dressing change Wound  Therapy - Frequency: 6X / week Wound Therapy - Follow Up Recommendations: Skilled nursing facility Wound Plan: see above  Wound Therapy Goals- Improve the function of patient's integumentary system by progressing the wound(s) through the phases of wound healing (inflammation -  proliferation - remodeling) by: Decrease Necrotic Tissue to: 20 Decrease Necrotic Tissue - Progress: Progressing toward goal Increase Granulation Tissue to: 80 Increase Granulation Tissue - Progress: Progressing toward goal Goals/treatment plan/discharge plan were made with and agreed upon by patient/family: Yes Time For Goal Achievement: 7 days Wound Therapy - Potential for Goals: Fair  Goals will be updated until maximal potential achieved or discharge criteria met.  Discharge criteria: when goals achieved, discharge from hospital, MD decision/surgical intervention, no progress towards goals, refusal/missing three consecutive treatments without notification or medical reason.  GP    Dani Gobble. [name] PT, DPT Acute Rehabilitation  [phone] Pager [phone]   [place] 06/22/2017, 5:19 PM

## 2017-06-22 NOTE — Progress Notes (Signed)
Physical Therapy Treatment Patient Details Name: John Bailey MRN: [id number] DOB: Jan 25, 1971 Today's Date: 06/22/2017    History of Present Illness Pt is a 47 y.o. male with history of RA, PVD, HTN, ESRD on HD, A. fib, DM, CHF and sacral/coccyx pressure ulcer, who was admitted from 04/22/2017-05/13/2017 with anasarca from CHF and A. fib with RVR (cardioverted); was d/c to CIR for debility. He underwent debridement of sacral decubitus ulcer on 06/04/2017 by general surgery.  Pt was supposed to discharge from CIR on 06/09/2017 but general surgery recommended continued inpatient wound care, patient was readmitted on 06/09/2017.    PT Comments    Pt is feeling more energy today to participate in PT treatment. Pt able to come to seated EoB with maxAx2, and sit for 8 min working on core strength and balance. Pt continues to be limited in his mobility by his RA pain and his decreased strength and endurance. PT will continue to work towards transfers and ambulation.      Follow Up Recommendations  SNF;LTACH     Equipment Recommendations  Other (comment)(TBD)       Precautions / Restrictions Precautions Precautions: Fall Precaution Comments: Severe RA in shoulders, left hand Required Braces or Orthoses: Other Brace/Splint Other Brace/Splint: Prevlon boots used this session Restrictions Weight Bearing Restrictions: No    Mobility  Bed Mobility Overal bed mobility: Needs Assistance Bed Mobility: Rolling     Supine to sit: Max assist;+2 for physical assistance     General bed mobility comments: maxAx2 for management of LE off bed and for trunk to upright, pt able to provide some movement of LE toward EoB as well as reaching acrossed body with R UE to pull on bed rail to bring body towards EoB  Transfers                 General transfer comment: Pt asked not to transfer out of bed because he is apprehensive of assistance back to bed after sitting up        Balance Overall  balance assessment: Needs assistance Sitting-balance support: Feet supported;Bilateral upper extremity supported Sitting balance-Leahy Scale: Fair Sitting balance - Comments: able to sit with UE in lap with good postural control                                    Cognition Arousal/Alertness: Awake/alert Behavior During Therapy: WFL for tasks assessed/performed Overall Cognitive Status: Impaired/Different from baseline Area of Impairment: Attention;Following commands;Safety/judgement;Awareness;Problem solving                   Current Attention Level: Selective   Following Commands: Follows multi-step commands with increased time     Problem Solving: Slow processing;Decreased initiation;Difficulty sequencing;Requires verbal cues;Requires tactile cues General Comments: Continues to have slowed processing      Exercises Other Exercises Other Exercises: seated balance x 8 minutes, reaching toward edges of BoS, working to offweight hips bilaterally with leans        Pertinent Vitals/Pain Pain Assessment: Faces Faces Pain Scale: Hurts even more Pain Location: BLEs (R>L), sacral wound with repositioning Pain Descriptors / Indicators: Discomfort;Grimacing Pain Intervention(s): Limited activity within patient's tolerance;Monitored during session;Repositioned           PT Goals (current goals can now be found in the care plan section) Acute Rehab PT Goals Patient Stated Goal: Feel better PT Goal Formulation: With patient Time For Goal Achievement:  06/28/17 Potential to Achieve Goals: Good Progress towards PT goals: Progressing toward goals    Frequency    Min 2X/week      PT Plan Current plan remains appropriate       AM-PAC PT "6 Clicks" Daily Activity  Outcome Measure  Difficulty turning over in bed (including adjusting bedclothes, sheets and blankets)?: Unable Difficulty moving from lying on back to sitting on the side of the bed? :  Unable Difficulty sitting down on and standing up from a chair with arms (e.g., wheelchair, bedside commode, etc,.)?: Unable Help needed moving to and from a bed to chair (including a wheelchair)?: Total Help needed walking in hospital room?: Total Help needed climbing 3-5 steps with a railing? : Total 6 Click Score: 6    End of Session   Activity Tolerance: Patient tolerated treatment well Patient left: in bed;with call bell/phone within reach;with bed alarm set Nurse Communication: Mobility status PT Visit Diagnosis: Other abnormalities of gait and mobility (R26.89);Muscle weakness (generalized) (M62.81) Pain - part of body: (L UE, B LE, sacral wound)     Time: 5625-6389 PT Time Calculation (min) (ACUTE ONLY): 32 min  Charges:  $Therapeutic Exercise: 8-22 mins $Therapeutic Activity: 8-22 mins                    G Codes:       John Bailey B. John Bailey PT, DPT Acute Rehabilitation  [phone] Pager [phone]     [place] 06/22/2017, 5:04 PM

## 2017-06-22 NOTE — Progress Notes (Signed)
Hospitalist progress note   John Bailey  WHQ:[id number] DOB: 02/05/1971 DOA: 06/08/2017 PCP: [name], MD Specialists:  Infectious disease Renal  Brief Narrative:  47 year old rheumatoid arthritis, PVD, ESRD TTS, A. fib chads score >3 Eliquis, diabetes mellitus type 2, chronic diastolic heart failure, pressure ulcers Readmitted from CIR 06/09/2017 for debridement inpatient wound care, developed fever and seen by infectious disease  Assessment & Plan:  Right gluteal abscess Status post debridement 06/14/17 Wound culture = Morganella, continue ceftaz a dime for 6 weeks stop date 07/25/2017-foll c ID in 4 wk Febrile 06/19/17-diarrhea at the time not felt to be C. difficile --no further workup  Acute blood loss anemia-excessive bleeding 4/6 surgeons packed wound and held Eliquis on at that time Hemoglobin 6.9 transfused 2 4/9 Re-check am  Unstageable sacral ulcer 3/22 Surgery as above, continue hydrotherapy--Looks like will continue the same For foreseeable future--per gen surgery and PT  ESRD on HD TTS HD as per [facility]-Phos 5.5-cont fosrenal l250 tid,  Calcitriol 0.5 tts, aranesp1 150 q thurs   afib Chad>2/eliquis--now on hold as per gen surg Cont amio, holding coreg  Chronic diastolic heart failure Managed at HD  Rheumatoid arthritis flare on abatacept Currently on prednisone taper 30 mg  Body mass index is 36.08 kg/m.    DVT prophylaxis: full  Code Status:   Ip   Family Communication:    none Disposition Plan: per gen surgery   Consultants:   Renal  gen surg  Procedures:   none  Antimicrobials:   ceftazidime   Subjective:  Awake alert in nad, no distress  Objective: Vitals:   06/22/17 1101 06/22/17 1116 06/22/17 1130 06/22/17 1145  BP: 112/60 (!) 116/55 131/68 127/67  Pulse:  83 76 79  Resp: 15 20 20 20   Temp: 98.2 F (36.8 C) 98.2 F (36.8 C) 98.2 F (36.8 C) 97.9 F (36.6 C)  TempSrc:  Oral  Oral  SpO2:    99%  Weight:    120.7 kg (266  lb)  Height:        Intake/Output Summary (Last 24 hours) at 06/22/2017 1239 Last data filed at 06/22/2017 1145 Gross per 24 hour  Intake 1145 ml  Output 2500 ml  Net -1355 ml   Filed Weights   06/21/17 0518 06/22/17 0732 06/22/17 1145  Weight: 124.3 kg (274 lb) 124.7 kg (275 lb) 120.7 kg (266 lb)    Examination: eomi ncat in nad s1 s2 nsr cta b abd soft nt nd n       Data Reviewed: I have personally reviewed following labs and imaging studies  CBC: Recent Labs  Lab 06/17/17 0351 06/18/17 0317 06/19/17 0251 06/19/17 1647 06/20/17 0254 06/21/17 0326 06/22/17 0753  WBC 18.4* 30.9* 30.9* 31.5* 29.2* 26.6* 24.5*  NEUTROABS 15.2* 26.5* 28.5*  --  27.1* 23.9*  --   HGB 7.8* 6.6* 7.1* 7.3* 7.3* 7.1* 6.9*  HCT 25.4* 20.5* 22.0* 23.5* 23.3* 22.4* 20.9*  MCV 87.3 90.3 88.7 85.8 86.0 83.9 84.3  PLT 521* 452* 460* 427* 413* 453* 659*   Basic Metabolic Panel: Recent Labs  Lab 06/17/17 0351 06/18/17 0317 06/19/17 0251 06/20/17 0254 06/21/17 0326 06/22/17 0753  NA 135 135 135 133*  --  134*  K 3.6 3.5 4.2 3.3*  --  3.1*  CL 96* 97* 97* 94*  --  97*  CO2 25 24 22 22   --  24  GLUCOSE 115* 116* 109* 185*  --  142*  BUN 35* 16 24* 18  --  52*  CREATININE 6.25* 4.26* 5.51* 4.21*  --  6.75*  CALCIUM 7.8* 7.7* 8.1* 7.9*  --  8.4*  MG 1.9 1.8 1.9 1.9 2.0  --   PHOS  --   --   --   --   --  5.5*   GFR: Estimated Creatinine Clearance: 18.7 mL/min (A) (by C-G formula based on SCr of 6.75 mg/dL (H)). Liver Function Tests: Recent Labs  Lab 06/22/17 0753  ALBUMIN 1.7*   No results for input(s): LIPASE, AMYLASE in the last 168 hours. No results for input(s): AMMONIA in the last 168 hours. Coagulation Profile: No results for input(s): INR, PROTIME in the last 168 hours. Cardiac Enzymes:  Radiology Studies: Reviewed images personally in health database   Scheduled Meds: . amiodarone  200 mg Oral Daily  . calcitRIOL  0.5 mcg Oral Q T,Th,Sat-1800  . darbepoetin  (ARANESP) injection - DIALYSIS  150 mcg Intravenous Q Thu-HD  . insulin aspart  0-15 Units Subcutaneous TID WC  . insulin aspart  0-5 Units Subcutaneous QHS  . insulin aspart  5 Units Subcutaneous TID WC  . insulin glargine  25 Units Subcutaneous QHS  . lanthanum  250 mg Oral TID WC  . potassium chloride  40 mEq Oral Daily  . predniSONE  30 mg Oral Q breakfast  . sodium chloride flush  3 mL Intravenous Q12H  . sodium hypochlorite   Irrigation BID   Continuous Infusions: . sodium chloride    . cefTAZidime (FORTAZ)  IV Stopped (06/19/17 1113)     LOS: 14 days    Time spent: [place], MD [facility]  If 7PM-7AM, please contact night-coverage [email or web address] Password [facility] 06/22/2017, 12:39 PM

## 2017-06-22 NOTE — Progress Notes (Signed)
PT Cancellation Note  Patient Details Name: John Bailey MRN: [id number] DOB: June 17, 1970   Cancelled Treatment:    Reason Eval/Treat Not Completed: (P) Patient at procedure or test/unavailable Pt is in HD this am. PT will follow back as able for treatment.   [name] B. [name] PT, DPT Acute Rehabilitation  [phone] Pager [phone]   [place] 06/22/2017, 12:25 PM

## 2017-06-22 NOTE — Progress Notes (Signed)
Patient ID: John Bailey, male   DOB: 05/02/1970, 47 y.o.   MRN: [id number] [facility] Progress Note   Assessment/ Plan:   1. Acute Blood Loss Anemia from gluteal abscess site-hemoglobin low, plan to transfuse another 2 units PRBC with dialysis today. 2. Sacral decubitus with osteomyelitis: Afebrile with slow improvement of leukocytosis.  Continue ceftazidime with hemodialysis for osteomyelitis/sacral wound-Morganella. 3. ESRDcontinue with hemodialysis on a Tuesday/Thursday/Saturday schedule.  4. CKD-MBD:Start fosrenol with meals TIDAC 5. Nutrition:Continue renal diet with ongoing protein supplementation 6. Hypertension:stable and continue with UF. 7. Vascular access- LAVF placed 05/10/17-maturation appears to be on the sluggish side, continue using TDC at this time. 8. Atrial fibrillation- per primary 9. Rheumatoid arthritis flare- on prednisone taper. 10. Deconditioning- continue with PT/OT  Subjective:   Reports to be feeling better- apprehensive about placement following DC.   Objective:   BP (!) 150/83   Pulse 75   Temp (!) 97.4 F (36.3 C) (Oral)   Resp (!) 22   Ht 6' (1.829 m)   Wt 124.7 kg (275 lb)   SpO2 99%   BMI 37.30 kg/m   Physical Exam: Gen: Comfortably resting on hemodialysis CVS: Pulse regular rhythm, normal rate, ejection systolic murmur over apex Resp: Diminished breath sounds with bases, no rales/rhonchi Abd: Soft, obese, nontender Ext: No lower extremity edema, palpable thrill left RCF  Labs: BMET Recent Labs  Lab 06/16/17 0259 06/17/17 0351 06/18/17 0317 06/19/17 0251 06/20/17 0254 06/22/17 0753  NA 134* 135 135 135 133* 134*  K 5.1 3.6 3.5 4.2 3.3* 3.1*  CL 96* 96* 97* 97* 94* 97*  CO2 22 25 24 22 22 24   GLUCOSE 152* 115* 116* 109* 185* 142*  BUN 28* 35* 16 24* 18 52*  CREATININE 4.85* 6.25* 4.26* 5.51* 4.21* 6.75*  CALCIUM 7.9* 7.8* 7.7* 8.1* 7.9* 8.4*  PHOS  --   --   --   --   --  5.5*   CBC Recent Labs  Lab  06/18/17 0317 06/19/17 0251 06/19/17 1647 06/20/17 0254 06/21/17 0326 06/22/17 0753  WBC 30.9* 30.9* 31.5* 29.2* 26.6* 24.5*  NEUTROABS 26.5* 28.5*  --  27.1* 23.9*  --   HGB 6.6* 7.1* 7.3* 7.3* 7.1* 6.9*  HCT 20.5* 22.0* 23.5* 23.3* 22.4* 20.9*  MCV 90.3 88.7 85.8 86.0 83.9 84.3  PLT 452* 460* 427* 413* 453* 468*   Medications:    . amiodarone  200 mg Oral Daily  . calcitRIOL  0.5 mcg Oral Q T,Th,Sat-1800  . darbepoetin (ARANESP) injection - DIALYSIS  150 mcg Intravenous Q Thu-HD  . insulin aspart  0-15 Units Subcutaneous TID WC  . insulin aspart  0-5 Units Subcutaneous QHS  . insulin aspart  5 Units Subcutaneous TID WC  . insulin glargine  25 Units Subcutaneous QHS  . potassium chloride  40 mEq Oral Daily  . predniSONE  30 mg Oral Q breakfast  . sodium chloride flush  3 mL Intravenous Q12H  . sodium hypochlorite   Irrigation BID   Elmarie Shiley, MD 06/22/2017, 9:21 AM

## 2017-06-22 NOTE — Progress Notes (Signed)
OT Cancellation Note  Patient Details Name: John Bailey MRN: [id number] DOB: 11/07/1970   Cancelled Treatment:    Reason Eval/Treat Not Completed: Patient at procedure or test/ unavailable, pt at HD  [name] 06/22/2017, 11:09 AM

## 2017-06-23 LAB — BPAM RBC
BLOOD PRODUCT EXPIRATION DATE: 201904292359
BLOOD PRODUCT EXPIRATION DATE: 201904292359
ISSUE DATE / TIME: 201904091022
ISSUE DATE / TIME: 201904091022
UNIT TYPE AND RH: 6200
Unit Type and Rh: 6200

## 2017-06-23 LAB — TYPE AND SCREEN
ABO/RH(D): A POS
Antibody Screen: NEGATIVE
Unit division: 0
Unit division: 0

## 2017-06-23 LAB — GLUCOSE, CAPILLARY
GLUCOSE-CAPILLARY: 107 mg/dL — AB (ref 65–99)
Glucose-Capillary: 138 mg/dL — ABNORMAL HIGH (ref 65–99)
Glucose-Capillary: 153 mg/dL — ABNORMAL HIGH (ref 65–99)
Glucose-Capillary: 165 mg/dL — ABNORMAL HIGH (ref 65–99)

## 2017-06-23 MED ORDER — POTASSIUM CHLORIDE CRYS ER 20 MEQ PO TBCR
40.0000 meq | EXTENDED_RELEASE_TABLET | Freq: Every day | ORAL | Status: DC
  Administered 2017-06-23 – 2017-06-24 (×2): 40 meq via ORAL
  Filled 2017-06-23: qty 2

## 2017-06-23 MED ORDER — POTASSIUM CHLORIDE CRYS ER 20 MEQ PO TBCR: 40.0000 meq | EXTENDED_RELEASE_TABLET | Freq: Two times a day (BID) | ORAL | Status: DC

## 2017-06-23 NOTE — Progress Notes (Signed)
Occupational Therapy Treatment Patient Details Name: John Bailey MRN: [id number] DOB: 1971-02-04 Today's Date: 06/23/2017    History of present illness Pt is a 47 y.o. male with history of RA, PVD, HTN, ESRD on HD, A. fib, DM, CHF and sacral/coccyx pressure ulcer, who was admitted from 04/22/2017-05/13/2017 with anasarca from CHF and A. fib with RVR (cardioverted); was d/c to CIR for debility. He underwent debridement of sacral decubitus ulcer on 06/04/2017 by general surgery.  Pt was supposed to discharge from CIR on 06/09/2017 but general surgery recommended continued inpatient wound care, patient was readmitted on 06/09/2017.   OT comments  Pt very motivated to participate with therapist and improve ADL and functional mobility independence. Pt was limited this session by L knee pain, decreased activity tolerance for ADL, and generalized weakness. Attempted multiple sit<>stand with max-total assist +2 but unable to achieve fully upright position for simulated toilet transfer from elevated bed surface to wheelchair with Roho cushion. Assisted pt with total assist to wheelchair with [facility] lift and facilitated appropriate positioning for function. OT goals updated based on current level of function this session.    Follow Up Recommendations  SNF;LTACH    Equipment Recommendations  Other (comment)(defer to next venue of care)    Recommendations for Other Services      Precautions / Restrictions Precautions Precautions: Fall Precaution Comments: Severe RA in shoulders, left hand Restrictions Weight Bearing Restrictions: No       Mobility Bed Mobility Overal bed mobility: Needs Assistance Bed Mobility: Rolling Rolling: Max assist Sidelying to sit: Mod assist;HOB elevated   Sit to supine: +2 for physical assistance;Total assist   General bed mobility comments: Max assist for bilateral rolling and mod assist to power trunk from elevated HOB. However, pt requiring total assist to  return to supine after standing attempts secondary to fatigue and positioning at edge of bed.   Transfers Overall transfer level: Needs assistance Equipment used: Left platform walker Transfers: Sit to/from Stand Sit to Stand: Total assist(+3; unable to assume fully upright standing position this se)         General transfer comment: Attempted sit<>stand with L platform RW 3x and unable to assume fully upright positioning. Also attempted with [facility] but not able to power up. Ultimately utilized maximove from bed to chair.     Balance Overall balance assessment: Needs assistance Sitting-balance support: Feet supported;Bilateral upper extremity supported Sitting balance-Leahy Scale: Fair Sitting balance - Comments: Able to statically sit at EOB without external assistance.    Standing balance support: Bilateral upper extremity supported Standing balance-Leahy Scale: Zero Standing balance comment: Unable to fully power up to standing.                            ADL either performed or assessed with clinical judgement   ADL Overall ADL's : Needs assistance/impaired                         Toilet Transfer: Total assistance;+2 for physical assistance(with maximove; +3 helpful) Toilet Transfer Details (indicate cue type and reason): Attempted stand-pivot transfer from elevated bed to wheelchair x3 attempts. However, pt unable to achieve fully upright position this session. Ultimately returned to supine and transferred to w/c with maximove.  Toileting- Clothing Manipulation and Hygiene: Total assistance;Bed level;+2 for physical assistance Toileting - Clothing Manipulation Details (indicate cue type and reason): +2 assist at bed level this session  Functional mobility during ADLs: Total assistance;+2 for physical assistance General ADL Comments: Attempted stand-pivot transfer from bed to chair for OOB ADL participation and simulated toilet transfers. However, unable  to achieve fully upright position although able to support self with some weight through BLE this session. Attempted standing 3 times with assistance from OT and nurse tech with and without STEDY. Ultimately returned to supine, positioned Maximove pad and lifted pt back to bed.      Vision   Vision Assessment?: No apparent visual deficits   Perception     Praxis      Cognition Arousal/Alertness: Awake/alert Behavior During Therapy: WFL for tasks assessed/performed Overall Cognitive Status: Impaired/Different from baseline Area of Impairment: Problem solving                             Problem Solving: Slow processing General Comments: Pt with slower processing at times.         Exercises Other Exercises Other Exercises: Encouraged BUE strengthening activities and demonstrated forward raises, cross body punches, and overhead presses. OT demonstrated activities.  Other Exercises: Facilitated B LE PROM/stretching in preparation for mobility.    Shoulder Instructions       General Comments      Pertinent Vitals/ Pain       Pain Assessment: Faces Faces Pain Scale: Hurts little more Pain Location: B knees (R>L), sacral wound with repositioning Pain Descriptors / Indicators: Discomfort;Grimacing Pain Intervention(s): Limited activity within patient's tolerance;Monitored during session;Repositioned  Home Living                                          Prior Functioning/Environment              Frequency  Min 2X/week        Progress Toward Goals  OT Goals(current goals can now be found in the care plan section)  Progress towards OT goals: Goals drowngraded-see care plan  Acute Rehab OT Goals Patient Stated Goal: Feel better OT Goal Formulation: With patient Time For Goal Achievement: 07/07/17 Potential to Achieve Goals: Good ADL Goals Pt Will Perform Grooming: with min assist;standing Pt Will Perform Lower Body Bathing: with min  assist;sit to/from stand Pt Will Perform Lower Body Dressing: with min assist;sit to/from stand Pt Will Transfer to Toilet: with min assist;stand pivot transfer;bedside commode Pt Will Perform Toileting - Clothing Manipulation and hygiene: with min assist;sit to/from stand  Plan Discharge plan remains appropriate;Frequency remains appropriate    Co-evaluation                 AM-PAC PT "6 Clicks" Daily Activity     Outcome Measure   Help from another person eating meals?: A Little Help from another person taking care of personal grooming?: A Little Help from another person toileting, which includes using toliet, bedpan, or urinal?: Total Help from another person bathing (including washing, rinsing, drying)?: A Lot Help from another person to put on and taking off regular upper body clothing?: A Lot Help from another person to put on and taking off regular lower body clothing?: Total 6 Click Score: 12    End of Session Equipment Utilized During Treatment: Other (comment)(maximove)  OT Visit Diagnosis: Muscle weakness (generalized) (M62.81);Other abnormalities of gait and mobility (R26.89);Other symptoms and signs involving cognitive function Pain - Right/Left: Left Pain - part of  body: Knee   Activity Tolerance Patient tolerated treatment well;Patient limited by pain   Patient Left in bed;with call bell/phone within reach;with family/visitor present   Nurse Communication Mobility status;Need for lift equipment        Time: 1359-1510 OT Time Calculation (min): 71 min  Charges: OT General Charges $OT Visit: 1 Visit OT Treatments $Self Care/Home Management : 53-67 mins $Therapeutic Activity: 8-22 mins  Norman Herrlich, MS OTR/L  Pager: [place] A Lynsey [place] 06/23/2017, 4:24 PM

## 2017-06-23 NOTE — Progress Notes (Signed)
Patient ID: John Bailey, male   DOB: 06-11-1970, 47 y.o.   MRN: [id number]    9 Days Post-Op  Subjective: No complaints  Objective: Vital signs in last 24 hours: Temp:  [97.9 F (36.6 C)-99.6 F (37.6 C)] 98.3 F (36.8 C) (04/09 2131) Pulse Rate:  [76-91] 91 (04/09 2131) Resp:  [15-27] 18 (04/09 2131) BP: (109-133)/(54-90) 109/68 (04/09 2131) SpO2:  [97 %-100 %] 97 % (04/09 2131) Weight:  [120.7 kg (266 lb)] 120.7 kg (266 lb) (04/10 0300) Last BM Date: 06/22/17  Intake/Output from previous day: 04/09 0701 - 04/10 0700 In: 945 [Blood:945] Out: 2500  Intake/Output this shift: No intake/output data recorded.  PE: Skin:       Lab Results:  Recent Labs    06/21/17 0326 06/22/17 0753  WBC 26.6* 24.5*  HGB 7.1* 6.9*  HCT 22.4* 20.9*  PLT 453* 468*   BMET Recent Labs    06/22/17 0753  NA 134*  K 3.1*  CL 97*  CO2 24  GLUCOSE 142*  BUN 52*  CREATININE 6.75*  CALCIUM 8.4*   PT/INR No results for input(s): LABPROT, INR in the last 72 hours. CMP     Component Value Date/Time   NA 134 (L) 06/22/2017 0753   K 3.1 (L) 06/22/2017 0753   CL 97 (L) 06/22/2017 0753   CO2 24 06/22/2017 0753   GLUCOSE 142 (H) 06/22/2017 0753   BUN 52 (H) 06/22/2017 0753   CREATININE 6.75 (H) 06/22/2017 0753   CALCIUM 8.4 (L) 06/22/2017 0753   PROT 5.7 (L) 06/15/2017 0240   ALBUMIN 1.7 (L) 06/22/2017 0753   AST 23 06/15/2017 0240   ALT 16 (L) 06/15/2017 0240   ALKPHOS 96 06/15/2017 0240   BILITOT 0.8 06/15/2017 0240   GFRNONAA 9 (L) 06/22/2017 0753   GFRAA 10 (L) 06/22/2017 0753   Lipase  No results found for: LIPASE     Studies/Results: Mr Lumbar Spine Wo Contrast  Result Date: 06/21/2017 CLINICAL DATA:  Fever of unknown origin in a diabetic patient. Skin ulceration over the coccyx. EXAM: MRI LUMBAR SPINE WITHOUT CONTRAST TECHNIQUE: Multiplanar, multisequence MR imaging of the lumbar spine was performed. No intravenous contrast was administered. COMPARISON:  MRI  lumbar spine 04/22/2011. FINDINGS: Segmentation:  Standard. Alignment:  Maintained. Vertebrae: No fracture or worrisome lesion. Please note that the coccyx is not included on this examination. No evidence of discitis or osteomyelitis is seen. Conus medullaris and cauda equina: Conus extends to the L2 level. Conus and cauda equina appear normal. Paraspinal and other soft tissues: There is partial visualization of a decubitus ulcer over the coccyx. Disc levels: T11-12 and T12-L1 are imaged in the sagittal plane only and negative. L1-2: Negative. L2-3: Mild facet degenerative change.  Otherwise negative. L3-4: Mild facet degenerative change.  Otherwise negative. L4-5: Very shallow disc bulge without central canal stenosis. There is a small protrusion in the right foramen causing mild foraminal narrowing. The left foramen is open. L5-S1: Very shallow right paracentral protrusion without stenosis. IMPRESSION: Partial visualization of a decubitus ulcer over the coccyx. Negative for discitis or osteomyelitis. Please note that the coccyx is not included on this lumbar spine MRI. Mild right foraminal narrowing L4-5 due to a small disc protrusion. Electronically Signed   By: Inge Rise M.D.   On: 06/21/2017 12:17    Anti-infectives: Anti-infectives (From admission, onward)   Start     Dose/Rate Route Frequency Ordered Stop   06/17/17 1200  cefTAZidime (FORTAZ) 2 g in sodium  chloride 0.9 % 100 mL IVPB     2 g 200 mL/hr over 30 Minutes Intravenous Every T-Th-Sa (Hemodialysis) 06/16/17 1001 07/25/17 2359   06/17/17 0837  vancomycin (VANCOCIN) 1-5 GM/200ML-% IVPB    Note to Pharmacy:  Louretta Shorten   : cabinet override      06/17/17 0837 06/17/17 1123   06/16/17 1100  cefTAZidime (FORTAZ) 2 g in sodium chloride 0.9 % 100 mL IVPB     2 g 200 mL/hr over 30 Minutes Intravenous  Once 06/16/17 1001 06/16/17 1137   06/15/17 1502  vancomycin (VANCOCIN) 1-5 GM/200ML-% IVPB    Note to Pharmacy:  Herriott, Melisa    : cabinet override      06/15/17 1502 06/15/17 1553   06/15/17 1200  vancomycin (VANCOCIN) IVPB 1000 mg/200 mL premix  Status:  Discontinued     1,000 mg 200 mL/hr over 60 Minutes Intravenous Every T-Th-Sa (Hemodialysis) 06/14/17 1256 06/17/17 1153   06/14/17 1330  vancomycin (VANCOCIN) 2,000 mg in sodium chloride 0.9 % 500 mL IVPB     2,000 mg 250 mL/hr over 120 Minutes Intravenous  Once 06/14/17 1256 06/14/17 2229   06/14/17 1130  piperacillin-tazobactam (ZOSYN) IVPB 3.375 g  Status:  Discontinued     3.375 g 12.5 mL/hr over 240 Minutes Intravenous Every 12 hours 06/14/17 1057 06/16/17 0936       Assessment/Plan AF/DVT - on Eliquis DCHF/NICM ESRD HD - TTS Cardiorenal syndrome Diabetes ID - peripheral neuropathy Hypertension RA - on steroids Deconditioning- cardiorenal syndrome Anemia- received 1 uPRBC 4/5 and 1 unit 4/6, holding eliquis, hg now stable at 7.1   Sacral ulcer S/pDEBRIDMENT OF SACRAL DECUBITUS ULCER [id number] Dr. Melanee Spry -Dakin's ends today.  Resume WD dressing changes.  Would like for fibrin tissue at base to improve prior to [facility] placement -cont hydro Right gluteal abscess S/pDebridement of 70cm^2 tissue involving subcutaneous tissue, fascia and muscle of right gluteal area, 06/14/17 Dr. Gurney Maxin - culture: [facility] MORGANII - per ID will likely require at least 6 weeks of antibiotics with dialysis for the sacral osteomyelitis -resume NS WD dressing changes after Dakin's treatment -wound is clean, will discuss removal of penrose so VAC can be placed  ID -fortaz 4/3>>,Vancomycin4/1>>4/4;Zosyn 4/1>>4/3;ancef perioperative3/22 FEN -renal diet VTE -SCDs, may resume eliquis Foley -none  Plan:cont hydrotherapy, may be able to place VAC on R gluteal wound.  May resume eliquis.    LOS: 15 days    Henreitta Cea , [facility] 06/23/2017, 10:35 AM Pager: [phone]

## 2017-06-23 NOTE — Progress Notes (Signed)
Initial Nutrition Assessment  DOCUMENTATION CODES:   Not applicable  INTERVENTION:  Nepro Shake po BID, each supplement provides 425 kcal and 19 grams protein  Discussed with patient, will order snacks for him  NUTRITION DIAGNOSIS:   Increased nutrient needs related to acute illness as evidenced by estimated needs.  GOAL:   Patient will meet greater than or equal to 90% of their needs  MONITOR:   PO intake, Labs, Weight trends, Supplement acceptance  REASON FOR ASSESSMENT:   Low Braden    ASSESSMENT:   John Bailey is a 47 y.o. male with a Past Medical History of RA, PVD, HTN, ESRD on TTS HD, afib on Eliquis, DM, and chronic diastolic CHF who was admitted from 2/7-28 with anasarca from acute on chronic diastolic heart failure and afib with RVR (cardioverted).  He was sent to rehab for debility secondary to cardiorenal syndrome and has remained there until today.  He was prepared to be discharged today but has an impressive sacral pressure ulcer that continues to require intensive inpatient wound care with hydrotherapy  Spoke with John Bailey at bedside. He reports losing 164 pounds in fluid recently. Per chart he was 341 pounds 1/18 and is now down to 266 pounds. Reports normal intake of bacon or cereal for breakfast, will skip lunch, snacks on a granola bar and protein shakes, and eats whatever his wife cooks for dinner. Often this involves a green food, a white food, and something baked, like white rice, green beans, and baked chicken. He seems to be ok with PO intake PTA, but hasn't cared much for food since he has been admitted. Has been getting people to bring him rotisserie chicken and spinach, things like that.  Labs reviewed:  Na 134, K+ 3.1, Phos 5.5 CBGs 107, 138, 210  Medications reviewed and include:  Insulin, Calcitriol, 40K+, Prednisone,   NUTRITION - FOCUSED PHYSICAL EXAM:    Most Recent Value  Orbital Region  No depletion  Upper Arm Region  No depletion   Thoracic and Lumbar Region  No depletion  Buccal Region  No depletion  Temple Region  No depletion  Clavicle Bone Region  No depletion  Clavicle and Acromion Bone Region  No depletion  Scapular Bone Region  Unable to assess  Dorsal Hand  No depletion  Patellar Region  No depletion  Anterior Thigh Region  No depletion  Posterior Calf Region  No depletion       Diet Order:  Fall precautions Diet renal/carb modified with fluid restriction Diet-HS Snack? Nothing; Fluid restriction: 1200 mL Fluid; Room service appropriate? Yes; Fluid consistency: Thin  EDUCATION NEEDS:   Education needs have been addressed  Skin:  Skin Assessment: Skin Integrity Issues: Skin Integrity Issues:: Stage IV, DTI, Incisions DTI: heel Stage IV: sacrum Incisions: closed to sacrum, open to r buttocks  Last BM:  06/22/2017  Height:   Ht Readings from Last 1 Encounters:  06/12/17 6' (1.829 m)    Weight:   Wt Readings from Last 1 Encounters:  06/23/17 266 lb (120.7 kg)    Ideal Body Weight:  80.9 kg  BMI:  Body mass index is 36.08 kg/m.  Estimated Nutritional Needs:   Kcal:  2200-2400 calories  Protein:  120-150 grams  Fluid:  2.2-2.4L  John Bailey. John Bernath, MS, RD LDN Inpatient Clinical Dietitian Pager [phone]

## 2017-06-23 NOTE — Progress Notes (Signed)
Pharmacy Antibiotic Note  John Bailey is a 47 y.o. male admitted on 06/08/2017 with osteo and sacral decubitus ulcer on hydrotherapy.  Pharmacy is consulted to dose Fortaz. -abscess cultures with morganella morganii (sens to rocephin) -Has been tolerating HD with last HD on 4/9.   Plan: -Continue Fortaz  2gm IV with HD TTS (6 weeks) -Will follow new cultures and clinical progress   Height: 6' (182.9 cm) Weight: 266 lb (120.7 kg) IBW/kg (Calculated) : 77.6  Temp (24hrs), Avg:98.5 F (36.9 C), Min:97.9 F (36.6 C), Max:99.6 F (37.6 C)  Recent Labs  Lab 06/17/17 0351 06/18/17 0317 06/19/17 0251 06/19/17 1647 06/20/17 0254 06/21/17 0326 06/22/17 0753  WBC 18.4* 30.9* 30.9* 31.5* 29.2* 26.6* 24.5*  CREATININE 6.25* 4.26* 5.51*  --  4.21*  --  6.75*    Estimated Creatinine Clearance: 18.7 mL/min (A) (by C-G formula based on SCr of 6.75 mg/dL (H)).    No Known Allergies  Antimicrobials this admission:  Fortaz 4/3>>(5/13) Zosyn 4/1 >> 4/3 Vanc 4/1 >> 4/4  Dose adjustments this admission:   Microbiology results:  3/31 BCx: 1/2 MRSE (contamination) 4/1 abscess - morganella morganii (sens to rocephin) 4/3 blood x2- ngf 3/26 & 4/2 MRSA PCR neg   Thank you for allowing [place] to participate in this patients care.  Jens Som, PharmD Clinical phone for 06/23/2017 from 7a-3:30p: [phone] If after 3:30p, please call main pharmacy at: [phone] 06/23/2017 7:52 AM

## 2017-06-23 NOTE — Progress Notes (Signed)
Hospitalist progress note   John Bailey  EYC:[id number] DOB: May 27, 1970 DOA: 06/08/2017 PCP: [name], MD Specialists:  Infectious disease Renal  Brief Narrative:  47 year old rheumatoid arthritis, PVD, ESRD TTS, A. fib chads score >3 Eliquis, diabetes mellitus type 2, chronic diastolic heart failure, pressure ulcers Readmitted from [facility] 06/09/2017 for debridement inpatient wound care, developed fever and seen by infectious disease  Assessment & Plan:  Right gluteal abscess Status post debridement 06/14/17 Wound culture = Morganella, continue ceftazadime for 6 weeks stop date 07/25/2017-foll c ID in 4 wk Febrile 06/19/17-diarrhea at the time not felt to be C. difficile --no further workup  Acute blood loss anemia-excessive bleeding 4/6 surgeons packed wound and held Eliquis on at that time Hemoglobin 6.9 transfused 2 4/9 Labs to be rechecked at next blood draw  Unstageable sacral ulcer 3/22 Surgery as above, continue hydrotherapy--Looks like will continue the same Discussed with Dr. Rosendo Gros and if able to place a VAC at next venue of care will do the same otherwise wet-to-dry dressings 3 times daily for week and will revisit with Dr. Rosendo Gros as an outpatient  ESRD on HD TTS HD as per [facility]-Phos 5.5-cont fosrenal 250 tid,  Calcitriol 0.5 tts, aranesp1 150 q thurs   afib Chad>2/eliquis--now on hold as per gen surg Cont amio, holding coreg  Chronic diastolic heart failure Managed at HD  Rheumatoid arthritis flare on abatacept Currently on prednisone taper 30 mg  Body mass index is 36.08 kg/m.    DVT prophylaxis: full  Code Status:   Ip   Family Communication:    none Disposition Plan: per gen surgery    Consultants:   Renal  gen surg  Procedures:   none  Antimicrobials:   ceftazidime   Subjective:  Awake alert sitting in chair had a better day today and was able to get out into the chair with therapy although feels very weak Asking if he can go off the  unit  Objective: Vitals:   06/22/17 1315 06/22/17 2131 06/23/17 0300 06/23/17 1404  BP: 133/90 109/68  (!) 143/78  Pulse: 89 91  81  Resp: 20 18  18   Temp: 99.6 F (37.6 C) 98.3 F (36.8 C)  (!) 97.4 F (36.3 C)  TempSrc: Oral Oral  Oral  SpO2: 100% 97%  97%  Weight:   120.7 kg (266 lb)   Height:       No intake or output data in the 24 hours ending 06/23/17 1733 Filed Weights   06/22/17 0732 06/22/17 1145 06/23/17 0300  Weight: 124.7 kg (275 lb) 120.7 kg (266 lb) 120.7 kg (266 lb)    Examination:  eomi ncat in nad s1 s2 nsr cta b abd soft nt nd nd Lower extremities  soft  Data Reviewed: I have personally reviewed following labs and imaging studies  CBC: Recent Labs  Lab 06/17/17 0351 06/18/17 0317 06/19/17 0251 06/19/17 1647 06/20/17 0254 06/21/17 0326 06/22/17 0753  WBC 18.4* 30.9* 30.9* 31.5* 29.2* 26.6* 24.5*  NEUTROABS 15.2* 26.5* 28.5*  --  27.1* 23.9*  --   HGB 7.8* 6.6* 7.1* 7.3* 7.3* 7.1* 6.9*  HCT 25.4* 20.5* 22.0* 23.5* 23.3* 22.4* 20.9*  MCV 87.3 90.3 88.7 85.8 86.0 83.9 84.3  PLT 521* 452* 460* 427* 413* 453* 149*   Basic Metabolic Panel: Recent Labs  Lab 06/17/17 0351 06/18/17 0317 06/19/17 0251 06/20/17 0254 06/21/17 0326 06/22/17 0753  NA 135 135 135 133*  --  134*  K 3.6 3.5 4.2 3.3*  --  3.1*  CL 96* 97* 97* 94*  --  97*  CO2 25 24 22 22   --  24  GLUCOSE 115* 116* 109* 185*  --  142*  BUN 35* 16 24* 18  --  52*  CREATININE 6.25* 4.26* 5.51* 4.21*  --  6.75*  CALCIUM 7.8* 7.7* 8.1* 7.9*  --  8.4*  MG 1.9 1.8 1.9 1.9 2.0  --   PHOS  --   --   --   --   --  5.5*   GFR: Estimated Creatinine Clearance: 18.7 mL/min (A) (by C-G formula based on SCr of 6.75 mg/dL (H)). Liver Function Tests: Recent Labs  Lab 06/22/17 0753  ALBUMIN 1.7*   No results for input(s): LIPASE, AMYLASE in the last 168 hours. No results for input(s): AMMONIA in the last 168 hours. Coagulation Profile: No results for input(s): INR, PROTIME in the last  168 hours. Cardiac Enzymes:  Radiology Studies: Reviewed images personally in health database   Scheduled Meds: . amiodarone  200 mg Oral Daily  . calcitRIOL  0.5 mcg Oral Q T,Th,Sat-1800  . darbepoetin (ARANESP) injection - DIALYSIS  150 mcg Intravenous Q Thu-HD  . insulin aspart  0-15 Units Subcutaneous TID WC  . insulin aspart  0-5 Units Subcutaneous QHS  . insulin aspart  5 Units Subcutaneous TID WC  . insulin glargine  25 Units Subcutaneous QHS  . lanthanum  250 mg Oral TID WC  . potassium chloride  40 mEq Oral Daily  . predniSONE  30 mg Oral Q breakfast  . sodium chloride flush  3 mL Intravenous Q12H  . sodium hypochlorite   Irrigation BID   Continuous Infusions: . sodium chloride    . cefTAZidime (FORTAZ)  IV Stopped (06/22/17 1313)     LOS: 15 days    Time spent: [place], MD [facility]  If 7PM-7AM, please contact night-coverage [email or web address] Password [facility] 06/23/2017, 5:33 PM

## 2017-06-23 NOTE — Progress Notes (Signed)
Patient ID: John Bailey, male   DOB: 1970/08/18, 47 y.o.   MRN: [id number]  [facility] Progress Note   Assessment/ Plan:   1. Acute Blood Loss Anemia from gluteal abscess site- s/p PRBCs yesterday with HD, will check repeat labs tomorrow. 2. Sacral decubitus with osteomyelitis: Afebrile with slow improvement of leukocytosis.  Continue ceftazidime with hemodialysis for osteomyelitis/sacral wound-Morganella (total duration 6 weeks). 3. ESRD: Ordered for tomorrow tocontinue with hemodialysis on a Tuesday/Thursday/Saturday schedule. Without acute HD needs at this time 4. CKD-MBD:Start fosrenol with meals TIDAC 5. Nutrition:Continue renal diet with ongoing protein supplementation 6. Hypertension:stable and continue with UF. 7. Vascular access- LAVF placed 05/10/17-maturation appears to be on the sluggish side, continue using TDC at this time. 8. Atrial fibrillation- per primary 9. Rheumatoid arthritis flare- on prednisone taper. 10. Deconditioning- continue with PT/OT  Subjective:   Reports to be feeling fair- motivated to ambulate.   Objective:   BP 109/68 (BP Location: Right Arm)   Pulse 91   Temp 98.3 F (36.8 C) (Oral)   Resp 18   Ht 6' (1.829 m)   Wt 120.7 kg (266 lb)   SpO2 97%   BMI 36.08 kg/m   Physical Exam: Gen: Comfortably resting in bed CVS: Pulse regular rhythm, normal rate, normal S1 and S2 Resp: Diminished breath sounds with bases, no rales/rhonchi Abd: Soft, obese, nontender Ext: No lower extremity edema, palpable thrill left RCF  Labs: BMET Recent Labs  Lab 06/17/17 0351 06/18/17 0317 06/19/17 0251 06/20/17 0254 06/22/17 0753  NA 135 135 135 133* 134*  K 3.6 3.5 4.2 3.3* 3.1*  CL 96* 97* 97* 94* 97*  CO2 25 24 22 22 24   GLUCOSE 115* 116* 109* 185* 142*  BUN 35* 16 24* 18 52*  CREATININE 6.25* 4.26* 5.51* 4.21* 6.75*  CALCIUM 7.8* 7.7* 8.1* 7.9* 8.4*  PHOS  --   --   --   --  5.5*   CBC Recent Labs  Lab 06/18/17 0317  06/19/17 0251 06/19/17 1647 06/20/17 0254 06/21/17 0326 06/22/17 0753  WBC 30.9* 30.9* 31.5* 29.2* 26.6* 24.5*  NEUTROABS 26.5* 28.5*  --  27.1* 23.9*  --   HGB 6.6* 7.1* 7.3* 7.3* 7.1* 6.9*  HCT 20.5* 22.0* 23.5* 23.3* 22.4* 20.9*  MCV 90.3 88.7 85.8 86.0 83.9 84.3  PLT 452* 460* 427* 413* 453* 468*   Medications:    . amiodarone  200 mg Oral Daily  . calcitRIOL  0.5 mcg Oral Q T,Th,Sat-1800  . darbepoetin (ARANESP) injection - DIALYSIS  150 mcg Intravenous Q Thu-HD  . insulin aspart  0-15 Units Subcutaneous TID WC  . insulin aspart  0-5 Units Subcutaneous QHS  . insulin aspart  5 Units Subcutaneous TID WC  . insulin glargine  25 Units Subcutaneous QHS  . lanthanum  250 mg Oral TID WC  . potassium chloride  40 mEq Oral Daily  . predniSONE  30 mg Oral Q breakfast  . sodium chloride flush  3 mL Intravenous Q12H  . sodium hypochlorite   Irrigation BID   Elmarie Shiley, MD 06/23/2017, 11:14 AM

## 2017-06-23 NOTE — Progress Notes (Signed)
Physical Therapy Wound Treatment Patient Details  Name: John Bailey MRN: [id number] Date of Birth: [birth year]  Today's Date: 06/23/2017 Time: 3235-5732 Time Calculation (min): 64 min  Subjective  Subjective: Pt anxious to get out of bed today Patient and Family Stated Goals: heal wounds  Prior Treatments: Surgical I&D 06/04/17 and 06/14/17  Pain Score:  Pt with 5/10 pain with debriding and packing of wounds. Pt with 2/10 pain after treatment.   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 after surgery on 4/1 (Active)  Wound Image   06/23/2017 10:00 AM  Dressing Type ABD;Gauze (Comment);Moist to dry;Barrier Film (skin prep) 06/23/2017 10:00 AM  Dressing Changed 06/23/2017 10:00 AM  Dressing Change Frequency Twice a day 06/23/2017 10:00 AM  State of Healing Early/partial granulation 06/23/2017 10:00 AM  Site / Wound Assessment Pink;Yellow;Painful;Granulation tissue 06/23/2017 10:00 AM  % Wound base Red or Granulating 75% 06/23/2017 10:00 AM  % Wound base Yellow/Fibrinous Exudate 25% 06/23/2017 10:00 AM  Peri-wound Assessment Pink;Intact 06/16/2017  7:40 AM  Wound Length (cm) 8.5 cm 06/23/2017 10:00 AM  Wound Width (cm) 6.5 cm 06/23/2017 10:00 AM  Wound Depth (cm) 4 cm 06/23/2017 10:00 AM  Wound Surface Area (cm^2) 55.25 cm^2 06/23/2017 10:00 AM  Wound Volume (cm^3) 221 cm^3 06/23/2017 10:00 AM  Tunneling (cm) 6cm at 5 o'clock 06/23/2017 10:00 AM  Undermining (cm) 2 cm 12-4 o'clock 06/23/2017 10:00 AM  Margins Unattached edges (unapproximated) 06/23/2017 10:00 AM  Drainage Amount Minimal 06/23/2017 10:00 AM  Drainage Description Serosanguineous;No odor 06/23/2017 10:00 AM  Treatment Debridement (Selective);Hydrotherapy (Pulse lavage);Packing (Saline gauze) 06/23/2017 10:00 AM   Packed with Dakins soaked Kerlix   Wound / Incision (Open or Dehisced) 06/17/17 Incision - Open Buttocks Right;Lateral R gluteal  surgical debridement of abcess  (Active)  Wound Image   06/23/2017 10:00 AM  Dressing Type ABD;Gauze (Comment);Silver hydrofiber 06/23/2017 10:00 AM  Dressing Changed Changed 06/23/2017 10:00 AM  Dressing Status New drainage 06/23/2017 10:00 AM  Dressing Change Frequency Twice a day 06/23/2017 10:00 AM  Site / Wound Assessment Red;Granulation tissue 06/23/2017 10:00 AM  % Wound base Red or Granulating 95% 06/23/2017 10:00 AM  % Wound base Other/Granulation Tissue (Comment) 5% 06/23/2017 10:00 AM  Wound Length (cm) 12.5 cm 06/23/2017 10:00 AM  Wound Width (cm) 9 cm 06/23/2017 10:00 AM  Wound Depth (cm) 4 cm 06/23/2017 10:00 AM  Wound Volume (cm^3) 450 cm^3 06/23/2017 10:00 AM  Wound Surface Area (cm^2) 112.5 cm^2 06/23/2017 10:00 AM  Tunneling (cm) 10 cm with drain at 5 o'clock 06/23/2017 10:00 AM  Closure None 06/23/2017 10:00 AM  Drainage Amount Moderate 06/23/2017 10:00 AM  Drainage Description Serosanguineous;Green 06/23/2017 10:00 AM  Treatment Packing (Saline gauze) 06/23/2017 10:00 AM   Packed with Dakins soaked Kerlix   Hydrotherapy Pulsed lavage therapy - wound location: sacrum Pulsed Lavage with Suction (psi): 4 psi(4 psi due to increased pain) Pulsed Lavage with Suction - Normal Saline Used: 1000 mL Pulsed Lavage Tip: Tip with splash shield Selective Debridement Selective Debridement - Location: sacrum  Selective Debridement - Tools Used: Forceps;Scissors Selective Debridement - Tissue Removed: brown, gray yellow, necrotic tissue.    Wound Assessment and Plan  Wound Therapy - Assess/Plan/Recommendations Wound Therapy - Clinical Statement: Continues to have fibrinous slough at base, and within tunnel at 5 o'clock continue and is the focus of selective debridement. Spoke with surgical PA about d/d Dakins after today's treatment  and work towards wond vac on gluteal wound. Continued hydro and selective debridement of sacral wound with goal of wound vac placement.  Factors Delaying/Impairing  Wound Healing: Immobility;Multiple medical problems;Diabetes Mellitus Hydrotherapy Plan: Pulsatile lavage with suction;Patient/family education;Dressing change Wound Therapy - Frequency: 6X / week Wound Therapy - Follow Up Recommendations: Skilled nursing facility Wound Plan: see above  Wound Therapy Goals- Improve the function of patient's integumentary system by progressing the wound(s) through the phases of wound healing (inflammation - proliferation - remodeling) by: Decrease Necrotic Tissue to: 20 Decrease Necrotic Tissue - Progress: Progressing toward goal Increase Granulation Tissue to: 80 Increase Granulation Tissue - Progress: Progressing toward goal Goals/treatment plan/discharge plan were made with and agreed upon by patient/family: Yes Time For Goal Achievement: 7 days Wound Therapy - Potential for Goals: Fair  Goals will be updated until maximal potential achieved or discharge criteria met.  Discharge criteria: when goals achieved, discharge from hospital, MD decision/surgical intervention, no progress towards goals, refusal/missing three consecutive treatments without notification or medical reason.  GP    Dani Gobble. [name] PT, DPT Acute Rehabilitation  [phone] Pager [phone]   [place] 06/23/2017, 10:11 AM

## 2017-06-24 LAB — RENAL FUNCTION PANEL
Albumin: 2 g/dL — ABNORMAL LOW (ref 3.5–5.0)
Anion gap: 14 (ref 5–15)
BUN: 40 mg/dL — AB (ref 6–20)
CALCIUM: 8.8 mg/dL — AB (ref 8.9–10.3)
CHLORIDE: 99 mmol/L — AB (ref 101–111)
CO2: 23 mmol/L (ref 22–32)
CREATININE: 5.44 mg/dL — AB (ref 0.61–1.24)
GFR calc non Af Amer: 12 mL/min — ABNORMAL LOW (ref >60)
GFR, EST AFRICAN AMERICAN: 13 mL/min — AB (ref >60)
Glucose, Bld: 113 mg/dL — ABNORMAL HIGH (ref 65–99)
Phosphorus: 4.8 mg/dL — ABNORMAL HIGH (ref 2.5–4.6)
Potassium: 4.2 mmol/L (ref 3.5–5.1)
SODIUM: 136 mmol/L (ref 135–145)

## 2017-06-24 LAB — GLUCOSE, CAPILLARY
GLUCOSE-CAPILLARY: 65 mg/dL (ref 65–99)
GLUCOSE-CAPILLARY: 62 mg/dL — AB (ref 65–99)
Glucose-Capillary: 85 mg/dL (ref 65–99)
Glucose-Capillary: 170 mg/dL — ABNORMAL HIGH (ref 65–99)
Glucose-Capillary: 107 mg/dL — ABNORMAL HIGH (ref 65–99)

## 2017-06-24 LAB — CBC WITH DIFFERENTIAL/PLATELET
BAND NEUTROPHILS: 2 %
BASOS ABS: 0 10*3/uL (ref 0.0–0.1)
BASOS PCT: 0 %
Blasts: 0 %
EOS ABS: 0 10*3/uL (ref 0.0–0.7)
EOS PCT: 0 %
HCT: 30 % — ABNORMAL LOW (ref 39.0–52.0)
Hemoglobin: 9.6 g/dL — ABNORMAL LOW (ref 13.0–17.0)
LYMPHS ABS: 1.2 10*3/uL (ref 0.7–4.0)
Lymphocytes Relative: 4 %
MCH: 28.2 pg (ref 26.0–34.0)
MCHC: 32 g/dL (ref 30.0–36.0)
MCV: 88.2 fL (ref 78.0–100.0)
METAMYELOCYTES PCT: 2 %
MONO ABS: 0.9 10*3/uL (ref 0.1–1.0)
Monocytes Relative: 3 %
Myelocytes: 2 %
NEUTROS ABS: 28.4 10*3/uL — AB (ref 1.7–7.7)
Neutrophils Relative %: 87 %
OTHER: 0 %
Platelets: 414 10*3/uL — ABNORMAL HIGH (ref 150–400)
Promyelocytes Relative: 0 %
RBC: 3.4 MIL/uL — ABNORMAL LOW (ref 4.22–5.81)
RDW: 18.9 % — AB (ref 11.5–15.5)
WBC: 30.5 10*3/uL — ABNORMAL HIGH (ref 4.0–10.5)
nRBC: 0 /100 WBC

## 2017-06-24 MED ORDER — DARBEPOETIN ALFA 150 MCG/0.3ML IJ SOSY
PREFILLED_SYRINGE | INTRAMUSCULAR | Status: AC
  Filled 2017-06-24: qty 0.3

## 2017-06-24 MED ORDER — DAKINS (1/4 STRENGTH) 0.125 % EX SOLN
Freq: Two times a day (BID) | CUTANEOUS | Status: AC
  Administered 2017-06-24 – 2017-06-26 (×5)
  Filled 2017-06-24 (×2): qty 473

## 2017-06-24 MED ORDER — PREDNISONE 20 MG PO TABS: 20.0000 mg | ORAL_TABLET | Freq: Every day | ORAL | Status: DC

## 2017-06-24 MED ORDER — HEPARIN SODIUM (PORCINE) 1000 UNIT/ML DIALYSIS: 40.0000 [IU]/kg | INTRAMUSCULAR | Status: DC | PRN

## 2017-06-24 MED ORDER — NEPRO/CARBSTEADY PO LIQD
237.0000 mL | Freq: Two times a day (BID) | ORAL | Status: DC
  Administered 2017-06-25 – 2017-06-30 (×10): 237 mL via ORAL

## 2017-06-24 NOTE — Progress Notes (Signed)
Subjective: Pt laying in bed. Hydrotherapy and RN at bedside   Exam:  Gen: alert, NAD. Skin: sacral decubitus ulcer with >75% granulation tissue at wound base and some yellow slough, no necrosis.  R gluteal ulcer with a clean wound base; penrose drain removed at bedside - drainage from penrose was green -concerning for possible pseudomonas. This area will need a little more debridement prior to [facility] placement.  A&P: Sacral and R gluteal ulcers with osteomyelitis - continue daily hydrotherapy and BID wet-to-dry dressing changes with Dakins soaked kerlix. tunnell where penrose was should also be packed with dakins. Hopefully this area with improve with above interventions and a wound VAC can be placed over the weekend or early next week.  Obie Dredge, PA-C [facility] Pager: [phone] Consults: [phone] Mon-Fri 7:00 am-4:30 pm Sat-Sun 7:00 am-11:30 am

## 2017-06-24 NOTE — Progress Notes (Signed)
PT Cancellation Note  Patient Details Name: John Bailey MRN: [id number] DOB: 12/16/1970   Cancelled Treatment:    Reason Eval/Treat Not Completed: Patient at procedure or test/unavailable--Pt in HD, will continue efforts    [facility] 06/24/2017, 8:58 AM

## 2017-06-24 NOTE — Progress Notes (Signed)
Physical Therapy Wound Treatment Patient Details  Name: John Bailey MRN: [id number] Date of Birth: 25-Jun-1970  Today's Date: 06/24/2017 Time: 1310-1416 Time Calculation (min): 66 min  Subjective  Subjective: Pt still very frustrated with the rate of wound healing and his current mobility status Patient and Family Stated Goals: heal wounds  Prior Treatments: Surgical I&D 06/04/17 and 06/14/17  Pain Score: Pt refuses pain medication. 9/10 pain with debridement and packing of wounds.  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 after surgery on 4/1 (Active)  Wound Image   06/23/2017 10:00 AM  Dressing Type ABD;Gauze (Comment);Moist to dry;Barrier Film (skin prep) 06/24/2017  2:00 PM  Dressing Changed 06/24/2017  2:00 PM  Dressing Change Frequency Twice a day 06/24/2017  2:00 PM  State of Healing Early/partial granulation 06/24/2017  2:00 PM  Site / Wound Assessment Pink;Yellow;Painful;Granulation tissue 06/24/2017  2:00 PM  % Wound base Red or Granulating 75% 06/24/2017  2:00 PM  % Wound base Yellow/Fibrinous Exudate 25% 06/24/2017  2:00 PM  Peri-wound Assessment Pink;Intact 06/16/2017  7:40 AM  Wound Length (cm) 8.5 cm 06/23/2017 10:00 AM  Wound Width (cm) 6.5 cm 06/23/2017 10:00 AM  Wound Depth (cm) 4 cm 06/23/2017 10:00 AM  Wound Surface Area (cm^2) 55.25 cm^2 06/23/2017 10:00 AM  Wound Volume (cm^3) 221 cm^3 06/23/2017 10:00 AM  Tunneling (cm) 6cm at 5 o'clock 06/23/2017 10:00 AM  Undermining (cm) 2 cm 12-4 o'clock 06/23/2017 10:00 AM  Margins Unattached edges (unapproximated) 06/24/2017  2:00 PM  Drainage Amount Minimal 06/24/2017  2:00 PM  Drainage Description Serosanguineous;No odor 06/24/2017  2:00 PM  Treatment Cleansed;Hydrotherapy (Pulse lavage);Packing (Saline gauze) 06/24/2017  2:00 PM   Dakins soaked Kerlix   Wound / Incision (Open or Dehisced) 06/17/17 Incision - Open Buttocks Right;Lateral R  gluteal surgical debridement of abcess  (Active)  Wound Image   06/23/2017 10:00 AM  Dressing Type ABD;Gauze (Comment);Silver hydrofiber 06/24/2017  2:00 PM  Dressing Changed Changed 06/24/2017  2:00 PM  Dressing Status New drainage 06/24/2017  2:00 PM  Dressing Change Frequency Twice a day 06/24/2017  2:00 PM  Site / Wound Assessment Red;Granulation tissue 06/24/2017  2:00 PM  % Wound base Red or Granulating 95% 06/24/2017  2:00 PM  % Wound base Other/Granulation Tissue (Comment) 5% 06/24/2017  2:00 PM  Peri-wound Assessment Intact 06/24/2017  2:00 PM  Wound Length (cm) 12.5 cm 06/23/2017 10:00 AM  Wound Width (cm) 9 cm 06/23/2017 10:00 AM  Wound Depth (cm) 4 cm 06/23/2017 10:00 AM  Wound Volume (cm^3) 450 cm^3 06/23/2017 10:00 AM  Wound Surface Area (cm^2) 112.5 cm^2 06/23/2017 10:00 AM  Tunneling (cm) 10 cm with drain at 5 o'clock 06/23/2017 10:00 AM  Closure None 06/24/2017  2:00 PM  Drainage Amount Moderate 06/24/2017  2:00 PM  Drainage Description Serosanguineous;Green 06/24/2017  2:00 PM  Treatment Debridement (Selective);Hydrotherapy (Pulse lavage);Packing (Saline gauze) 06/24/2017  2:00 PM   Dakins soaked Kerlix in wound and both ends of tunnel created by Penrose drain.   Hydrotherapy Pulsed lavage therapy - wound location: sacrum Pulsed Lavage with Suction (psi): 4 psi(4 psi due to increased pain) Pulsed Lavage with Suction - Normal Saline Used: 1000 mL Pulsed Lavage Tip: Tip with splash shield Selective Debridement Selective Debridement - Location: sacrum  Selective Debridement - Tools Used: Forceps;Scissors Selective Debridement - Tissue Removed: brown, gray yellow, necrotic tissue.    Wound Assessment and Plan  Wound Therapy - Assess/Plan/Recommendations Wound Therapy - Clinical Statement: Surgical PA present to remove Penrose drain from gluteal wound. Dark green drainage present around drain. PA restarted DaKins solution on both wounds and postponed wound vac placement until drainage is  no longer green.  Tunnel at 5 o'clock of the sacral wound is very close to connection with the gluteal wound. Will continue to monitor.  Factors Delaying/Impairing Wound Healing: Immobility;Multiple medical problems;Diabetes Mellitus Hydrotherapy Plan: Pulsatile lavage with suction;Patient/family education;Dressing change Wound Therapy - Frequency: 6X / week Wound Therapy - Follow Up Recommendations: Skilled nursing facility Wound Plan: see above  Wound Therapy Goals- Improve the function of patient's integumentary system by progressing the wound(s) through the phases of wound healing (inflammation - proliferation - remodeling) by: Decrease Necrotic Tissue to: 20 Decrease Necrotic Tissue - Progress: Progressing toward goal Increase Granulation Tissue to: 80 Increase Granulation Tissue - Progress: Progressing toward goal Goals/treatment plan/discharge plan were made with and agreed upon by patient/family: Yes Time For Goal Achievement: 7 days Wound Therapy - Potential for Goals: Fair  Goals will be updated until maximal potential achieved or discharge criteria met.  Discharge criteria: when goals achieved, discharge from hospital, MD decision/surgical intervention, no progress towards goals, refusal/missing three consecutive treatments without notification or medical reason.  GP    Dani Gobble. [name] PT, DPT Acute Rehabilitation  [phone] Pager [phone]   [place] 06/24/2017, 3:01 PM

## 2017-06-24 NOTE — Progress Notes (Signed)
CRITICAL VALUE ALERT  Critical Value:  CBG 65  Date & Time Notied:  06/24/17 @ 1212  Provider Notified:   Orders Received/Actions taken: apple juice given. Repeat CBG 62. Apple juice given again. Repeat CBG 85.

## 2017-06-24 NOTE — Progress Notes (Signed)
Patient ID: John Bailey, male   DOB: 1970/05/22, 47 y.o.   MRN: [id number] [facility] Progress Note   Assessment/ Plan:   1. Acute Blood Loss Anemia from gluteal abscess site- s/p PRBCs with appropriate rise in hemoglobin, will continue ESA (suspect resistance from infection/inflammatory complex of wound) 2. Sacral decubitus with osteomyelitis: Afebrile with a rise of leukocytosis noted.  Continue ceftazidime with hemodialysis for osteomyelitis/sacral wound-Morganella (total duration 6 weeks). 3. ESRD: On HD today tocontinue with hemodialysis on a Tuesday/Thursday/Saturday schedule. Appears euvolemic and without acute electrolyte abnormalities.  4. CKD-MBD:Start fosrenol with meals TIDAC 5. Nutrition:Continue renal diet with ongoing protein supplementation 6. Hypertension:stable and continue with UF. 7. Vascular access- LAVF placed 05/10/17-maturation appears to be on the sluggish side, continue using TDC at this time. 8. Atrial fibrillation- per primary 9. Rheumatoid arthritis flare- on prednisone taper. 10. Deconditioning- continue with PT/OT  Subjective:   Reports to be feeling fair- continues to want to try and ambulate to gear towards DC.   Objective:   BP (!) 151/88   Pulse 87   Temp 97.9 F (36.6 C) (Oral)   Resp 17   Ht 6' (1.829 m)   Wt 120.8 kg (266 lb 5.1 oz)   SpO2 97%   BMI 36.12 kg/m   Physical Exam: Gen: Comfortable on hemodialysis CVS: Pulse regular rhythm, normal rate, normal S1 and S2 Resp: Diminished breath sounds with bases, no rales/rhonchi Abd: Soft, obese, nontender Ext: No lower extremity edema, palpable thrill left RCF  Labs: BMET Recent Labs  Lab 06/18/17 0317 06/19/17 0251 06/20/17 0254 06/22/17 0753 06/24/17 0647  NA 135 135 133* 134* 136  K 3.5 4.2 3.3* 3.1* 4.2  CL 97* 97* 94* 97* 99*  CO2 24 22 22 24 23   GLUCOSE 116* 109* 185* 142* 113*  BUN 16 24* 18 52* 40*  CREATININE 4.26* 5.51* 4.21* 6.75* 5.44*  CALCIUM  7.7* 8.1* 7.9* 8.4* 8.8*  PHOS  --   --   --  5.5* 4.8*   CBC Recent Labs  Lab 06/19/17 0251  06/20/17 0254 06/21/17 0326 06/22/17 0753 06/24/17 0647  WBC 30.9*   < > 29.2* 26.6* 24.5* 30.5*  NEUTROABS 28.5*  --  27.1* 23.9*  --  28.4*  HGB 7.1*   < > 7.3* 7.1* 6.9* 9.6*  HCT 22.0*   < > 23.3* 22.4* 20.9* 30.0*  MCV 88.7   < > 86.0 83.9 84.3 88.2  PLT 460*   < > 413* 453* 468* 414*   < > = values in this interval not displayed.   Medications:    . amiodarone  200 mg Oral Daily  . calcitRIOL  0.5 mcg Oral Q T,Th,Sat-1800  . darbepoetin (ARANESP) injection - DIALYSIS  150 mcg Intravenous Q Thu-HD  . insulin aspart  0-15 Units Subcutaneous TID WC  . insulin aspart  0-5 Units Subcutaneous QHS  . insulin aspart  5 Units Subcutaneous TID WC  . insulin glargine  25 Units Subcutaneous QHS  . lanthanum  250 mg Oral TID WC  . potassium chloride  40 mEq Oral Daily  . predniSONE  30 mg Oral Q breakfast  . sodium chloride flush  3 mL Intravenous Q12H   Elmarie Shiley, MD 06/24/2017, 10:11 AM

## 2017-06-24 NOTE — Progress Notes (Signed)
Hospitalist progress note   RON BESKE  VOH:[id number] DOB: 02-18-1971 DOA: 06/08/2017 PCP: [name], MD Specialists:  Infectious disease Renal  Brief Narrative:  47 year old rheumatoid arthritis, PVD, ESRD TTS, A. fib chads score >3 Eliquis, diabetes mellitus type 2, chronic diastolic heart failure, pressure ulcers Readmitted from CIR 06/09/2017 for debridement inpatient wound care, developed fever and seen by infectious disease  Assessment & Plan:  Right gluteal abscess Status post debridement 06/14/17 Wound culture = Morganella, continue ceftazadime for 6 weeks stop date 07/25/2017-foll c ID in 4 wk Febrile 06/19/17-diarrhea at the time not felt to be C. difficile --no further workup Some pain and discomfort still-pain management as per general surgery.  Patient allowed to go off unit in wheelchair with nursing  Acute blood loss anemia-excessive bleeding 4/6 surgeons packed wound and held Eliquis on at that time Hemoglobin 6.9 transfused 2 on 4/9 Hemoglobin stable 9.6  Unstageable sacral ulcer 3/22 Surgery as above, continue hydrotherapy--it appears patient will need further surgical debridement?  Dakin solution Discussed with Dr. Rosendo Gros and if able to place a VAC at next venue of care will do the same otherwise wet-to-dry dressings 3 times daily for week and will revisit with Dr. Rosendo Gros as an outpatient  ESRD on HD TTS HD as per [facility]-Phos 5.5-cont fosrenal 250 tid,  Calcitriol 0.5 tts, aranesp1 150 q thurs   afib Chad>2/eliquis--now on hold as per gen surg Cont amio, holding coreg  Chronic diastolic heart failure Managed at HD  Rheumatoid arthritis flare on abatacept Currently on prednisone taper 30 mg-we will cut back dose to 20 mg today and see if there is a response to the white count-I do not think the leukocytosis is secondary to infected state rather secondary to a result of being on steroids and tapering the same may help differentiate  Body mass index is  36.75 kg/m.    DVT prophylaxis: full  Code Status:   Ip   Family Communication:    none Disposition Plan: per gen surgery    Consultants:   Renal  gen surg  Procedures:   none  Antimicrobials:   ceftazidime   Subjective:  Awake alert sitting up in the bed without distress Eating and drinking fair Was able to go off unit yesterday downstairs Is having some discomfort sitting in one position and needs to be repositioned-nursing is aware  Objective: Vitals:   06/24/17 1100 06/24/17 1130 06/24/17 1140 06/24/17 1207  BP: 140/88 140/86 (!) 144/90 136/84  Pulse: 90 92 96 92  Resp:   18   Temp:   98 F (36.7 C) 97.8 F (36.6 C)  TempSrc:   Oral Oral  SpO2:   99% 99%  Weight:   122.9 kg (270 lb 15.1 oz) 122.9 kg (271 lb)  Height:        Intake/Output Summary (Last 24 hours) at 06/24/2017 1352 Last data filed at 06/24/2017 1140 Gross per 24 hour  Intake 480 ml  Output 2000 ml  Net -1520 ml   Filed Weights   06/24/17 0742 06/24/17 1140 06/24/17 1207  Weight: 124.8 kg (275 lb 2.2 oz) 122.9 kg (270 lb 15.1 oz) 122.9 kg (271 lb)    Examination:  oriented no distress Seems tired S1-S2 no murmur rub or gallop Chest is clear Wound not examined today Lower extremities are soft with stasis dermatitis  Data Reviewed: I have personally reviewed following labs and imaging studies  CBC: Recent Labs  Lab 06/18/17 0317 06/19/17 0251 06/19/17 1647 06/20/17  9211 06/21/17 0326 06/22/17 0753 06/24/17 0647  WBC 30.9* 30.9* 31.5* 29.2* 26.6* 24.5* 30.5*  NEUTROABS 26.5* 28.5*  --  27.1* 23.9*  --  28.4*  HGB 6.6* 7.1* 7.3* 7.3* 7.1* 6.9* 9.6*  HCT 20.5* 22.0* 23.5* 23.3* 22.4* 20.9* 30.0*  MCV 90.3 88.7 85.8 86.0 83.9 84.3 88.2  PLT 452* 460* 427* 413* 453* 468* 941*   Basic Metabolic Panel: Recent Labs  Lab 06/18/17 0317 06/19/17 0251 06/20/17 0254 06/21/17 0326 06/22/17 0753 06/24/17 0647  NA 135 135 133*  --  134* 136  K 3.5 4.2 3.3*  --  3.1* 4.2  CL  97* 97* 94*  --  97* 99*  CO2 24 22 22   --  24 23  GLUCOSE 116* 109* 185*  --  142* 113*  BUN 16 24* 18  --  52* 40*  CREATININE 4.26* 5.51* 4.21*  --  6.75* 5.44*  CALCIUM 7.7* 8.1* 7.9*  --  8.4* 8.8*  MG 1.8 1.9 1.9 2.0  --   --   PHOS  --   --   --   --  5.5* 4.8*   GFR: Estimated Creatinine Clearance: 23.5 mL/min (A) (by C-G formula based on SCr of 5.44 mg/dL (H)). Liver Function Tests: Recent Labs  Lab 06/22/17 0753 06/24/17 0647  ALBUMIN 1.7* 2.0*   No results for input(s): LIPASE, AMYLASE in the last 168 hours. No results for input(s): AMMONIA in the last 168 hours. Coagulation Profile: No results for input(s): INR, PROTIME in the last 168 hours. Cardiac Enzymes:  Radiology Studies: Reviewed images personally in health database   Scheduled Meds: . amiodarone  200 mg Oral Daily  . calcitRIOL  0.5 mcg Oral Q T,Th,Sat-1800  . darbepoetin (ARANESP) injection - DIALYSIS  150 mcg Intravenous Q Thu-HD  . insulin aspart  0-15 Units Subcutaneous TID WC  . insulin aspart  0-5 Units Subcutaneous QHS  . insulin aspart  5 Units Subcutaneous TID WC  . insulin glargine  25 Units Subcutaneous QHS  . lanthanum  250 mg Oral TID WC  . potassium chloride  40 mEq Oral Daily  . predniSONE  30 mg Oral Q breakfast  . sodium chloride flush  3 mL Intravenous Q12H  . sodium hypochlorite   Irrigation BID   Continuous Infusions: . sodium chloride    . cefTAZidime (FORTAZ)  IV Stopped (06/24/17 1211)     LOS: 16 days    Time spent: [place], MD [facility]  If 7PM-7AM, please contact night-coverage [email or web address] Password [email or web address] 06/24/2017, 1:52 PM

## 2017-06-24 NOTE — Progress Notes (Signed)
NCM received consult for LTAC. NCM explained LTAC to pt.  NCM explained HD bed not available @ [facility]. [facility] offered to pt  by NCM and pt stated he  is open to speak with [facility] rep. with wife.Referral made with [facility] @ [phone]. Whitman Hero RN,BSN,CM

## 2017-06-25 LAB — RENAL FUNCTION PANEL
ALBUMIN: 2.1 g/dL — AB (ref 3.5–5.0)
ANION GAP: 12 (ref 5–15)
BUN: 26 mg/dL — ABNORMAL HIGH (ref 6–20)
CALCIUM: 8.7 mg/dL — AB (ref 8.9–10.3)
CO2: 23 mmol/L (ref 22–32)
Chloride: 99 mmol/L — ABNORMAL LOW (ref 101–111)
Creatinine, Ser: 3.84 mg/dL — ABNORMAL HIGH (ref 0.61–1.24)
GFR calc Af Amer: 20 mL/min — ABNORMAL LOW (ref >60)
GFR, EST NON AFRICAN AMERICAN: 18 mL/min — AB (ref >60)
Glucose, Bld: 94 mg/dL (ref 65–99)
Phosphorus: 3.7 mg/dL (ref 2.5–4.6)
Potassium: 5.2 mmol/L — ABNORMAL HIGH (ref 3.5–5.1)
SODIUM: 134 mmol/L — AB (ref 135–145)

## 2017-06-25 LAB — CBC WITH DIFFERENTIAL/PLATELET
BAND NEUTROPHILS: 4 %
BLASTS: 0 %
Basophils Absolute: 0 10*3/uL (ref 0.0–0.1)
Basophils Relative: 0 %
EOS ABS: 0 10*3/uL (ref 0.0–0.7)
Eosinophils Relative: 0 %
HCT: 30.9 % — ABNORMAL LOW (ref 39.0–52.0)
HEMOGLOBIN: 10 g/dL — AB (ref 13.0–17.0)
LYMPHS PCT: 10 %
Lymphs Abs: 3 10*3/uL (ref 0.7–4.0)
MCH: 28.8 pg (ref 26.0–34.0)
MCHC: 32.4 g/dL (ref 30.0–36.0)
MCV: 89 fL (ref 78.0–100.0)
Metamyelocytes Relative: 1 %
Monocytes Absolute: 0.9 10*3/uL (ref 0.1–1.0)
Monocytes Relative: 3 %
Myelocytes: 0 %
Neutro Abs: 25.7 10*3/uL — ABNORMAL HIGH (ref 1.7–7.7)
Neutrophils Relative %: 82 %
OTHER: 0 %
PROMYELOCYTES RELATIVE: 0 %
Platelets: 395 10*3/uL (ref 150–400)
RBC: 3.47 MIL/uL — ABNORMAL LOW (ref 4.22–5.81)
RDW: 19.3 % — ABNORMAL HIGH (ref 11.5–15.5)
WBC: 29.6 10*3/uL — AB (ref 4.0–10.5)
nRBC: 0 /100 WBC

## 2017-06-25 LAB — GLUCOSE, CAPILLARY
GLUCOSE-CAPILLARY: 164 mg/dL — AB (ref 65–99)
GLUCOSE-CAPILLARY: 114 mg/dL — AB (ref 65–99)
Glucose-Capillary: 98 mg/dL (ref 65–99)
Glucose-Capillary: 91 mg/dL (ref 65–99)
Glucose-Capillary: 71 mg/dL (ref 65–99)

## 2017-06-25 LAB — HEPATITIS B SURFACE ANTIGEN: HEP B S AG: NEGATIVE

## 2017-06-25 NOTE — Progress Notes (Signed)
PT Cancellation Note  Patient Details Name: John Bailey MRN: [id number] DOB: May 14, 1970   Cancelled Treatment:    Reason Eval/Treat Not Completed: Patient declined, no reason specified.  Will see 4/13 as pt allows and as able. 06/25/2017  Donnella Sham, PT [phone]  (pager)   [name] 06/25/2017, 1:31 PM

## 2017-06-25 NOTE — Progress Notes (Signed)
PT Cancellation Note  Patient Details Name: LINDA BIEHN MRN: [id number] DOB: 1970-09-19   Cancelled Treatment:    Reason Eval/Treat Not Completed: (P) Patient at procedure or test/unavailable(Per RN patient off unit with family per orders from MD to be outside daily.  Will f/u when patient returns to room.)   Paulino Cork Eli Hose 06/25/2017, 3:28 PM  Governor [place], PTA pager [phone]

## 2017-06-25 NOTE — Progress Notes (Signed)
Pt taken off  unit with friends from church and family per MD Samtani's order.   1100: Pt still off floor. RN went to find pt. Pt with family in the [place] tower waiting room. Pt asked to come back to unit. Pt's uncle stated " you can't make him do what he doesn't want to do". This RN explained that the pt does not have to do what he does not want to, but that in order to care for the pt he needed to be on the unit. Pt's uncle said that "he is leaving this hospital if he finds somewhere else to go". This RN said that the pt can leave AMA if he wants to, but he would need to come back to the unit to sign the form and gather his things. The pt's other family member said that they will come up when she finishes her phone call. MD Samtani made aware.

## 2017-06-25 NOTE — Progress Notes (Addendum)
Pt taken outside for a short time with assistance per MD Samtani's order.

## 2017-06-25 NOTE — Progress Notes (Signed)
PT Cancellation Note  Patient Details Name: John Bailey MRN: [id number] DOB: [birth year]   Cancelled Treatment:    Reason Eval/Treat Not Completed: (P) Patient declined, no reason specified(Pt refused despite encouragment.  Pt reports he wished for PT to return to check on patient.  Will continue efforts per POC.  )   Shamar Kracke Eli Hose 06/25/2017, 3:26 PM  Governor [place], PTA pager [phone]

## 2017-06-25 NOTE — Progress Notes (Signed)
Hospitalist progress note   John Bailey  JYN:[id number] DOB: [birth year] DOA: 06/08/2017 PCP: [name], MD Specialists:  Infectious disease Renal  Brief Narrative:  47 year old rheumatoid arthritis, PVD, ESRD TTS, A. fib chads score >3 Eliquis, diabetes mellitus type 2, chronic diastolic heart failure, pressure ulcers Readmitted from CIR 06/09/2017 for debridement inpatient wound care, developed fever and seen by infectious disease  Assessment & Plan:  Right gluteal abscess Status post debridement 06/14/17 Wound culture = Morganella, continue ceftazadime for 6 weeks stop date 07/25/2017-foll c ID in 4 wk Febrile 06/19/17-diarrhea at the time not felt to be C. difficile --no further workup Some pain and discomfort still-pain management as per general surgery.  Patient will be seen by hydrotherapy if he wishes-for some reason he was agitated about his level of care and was transferred to another hospital on 4/12 I called [facility] at patient request and he was declined for transfer  Acute blood loss anemia-excessive bleeding 4/6 surgeons packed wound and held Eliquis on at that time Hemoglobin 6.9 transfused 2 on 4/9 Hemoglobin stable 9.6And then went up to 10  Unstageable sacral ulcer 3/22 Discussed with Dr. Rosendo Gros and if able to place a VAC at next venue of care will do the same otherwise wet-to-dry dressings 3 times daily for week and will revisit with Dr. Rosendo Gros as an outpatient Awaiting LTAC level of care  ESRD on HD TTS HD as per [facility]-Phos 5.5--->3.7 cont fosrenal 250 tid,  Calcitriol 0.5 tts, aranesp1 150 q thurs   afib Chad>2/eliquis--now on hold as per gen surg Cont amio 200 mg, holding coreg  Chronic diastolic heart failure Managed at HD  Rheumatoid arthritis flare on abatacept Currently on prednisone taper 30 mg-we will cut back dose to 20 mg today and see if there is a response to the white count-so far no change to white count but no fever-  Body  mass index is 36.48 kg/m.    DVT prophylaxis: full  Code Status:   Ip   Family Communication:    none Disposition Plan: per gen surgery    Consultants:   Renal  gen surg  Procedures:   none  Antimicrobials:   ceftazidime   Subjective:  Upset and agitated, multiple family members in the room demanding transfer to alternate hospital, patient would not specify what is wrong with him or what the concern was and unit director was in room with me trying to ascertain what the problem might of been visibly upset   Objective: Vitals:   06/24/17 2132 06/25/17 0500 06/25/17 0643 06/25/17 1402  BP: 132/78  126/81 122/84  Pulse: 88  85 100  Resp: 18  20 18   Temp: 97.7 F (36.5 C)  98.3 F (36.8 C) 98.2 F (36.8 C)  TempSrc:    Oral  SpO2: 98%  98% 100%  Weight:  122 kg (268 lb 15.4 oz)    Height:        Intake/Output Summary (Last 24 hours) at 06/25/2017 1543 Last data filed at 06/25/2017 1404 Gross per 24 hour  Intake 0 ml  Output -  Net 0 ml   Filed Weights   06/24/17 1140 06/24/17 1207 06/25/17 0500  Weight: 122.9 kg (270 lb 15.1 oz) 122.9 kg (271 lb) 122 kg (268 lb 15.4 oz)    Examination:  oriented n EOMI NCAT S1-S2 sinus Abdomen soft Wound not examined today   Data Reviewed: I have personally reviewed following labs and imaging studies  CBC: Recent  Labs  Lab 06/19/17 0251  06/20/17 0254 06/21/17 0326 06/22/17 0753 06/24/17 0647 06/25/17 0710  WBC 30.9*   < > 29.2* 26.6* 24.5* 30.5* 29.6*  NEUTROABS 28.5*  --  27.1* 23.9*  --  28.4* 25.7*  HGB 7.1*   < > 7.3* 7.1* 6.9* 9.6* 10.0*  HCT 22.0*   < > 23.3* 22.4* 20.9* 30.0* 30.9*  MCV 88.7   < > 86.0 83.9 84.3 88.2 89.0  PLT 460*   < > 413* 453* 468* 414* 395   < > = values in this interval not displayed.   Basic Metabolic Panel: Recent Labs  Lab 06/19/17 0251 06/20/17 0254 06/21/17 0326 06/22/17 0753 06/24/17 0647 06/25/17 0710  NA 135 133*  --  134* 136 134*  K 4.2 3.3*  --  3.1* 4.2  5.2*  CL 97* 94*  --  97* 99* 99*  CO2 22 22  --  24 23 23   GLUCOSE 109* 185*  --  142* 113* 94  BUN 24* 18  --  52* 40* 26*  CREATININE 5.51* 4.21*  --  6.75* 5.44* 3.84*  CALCIUM 8.1* 7.9*  --  8.4* 8.8* 8.7*  MG 1.9 1.9 2.0  --   --   --   PHOS  --   --   --  5.5* 4.8* 3.7   GFR: Estimated Creatinine Clearance: 33.1 mL/min (A) (by C-G formula based on SCr of 3.84 mg/dL (H)). Liver Function Tests: Recent Labs  Lab 06/22/17 0753 06/24/17 0647 06/25/17 0710  ALBUMIN 1.7* 2.0* 2.1*   No results for input(s): LIPASE, AMYLASE in the last 168 hours. No results for input(s): AMMONIA in the last 168 hours. Coagulation Profile: No results for input(s): INR, PROTIME in the last 168 hours. Cardiac Enzymes:  Radiology Studies: Reviewed images personally in health database   Scheduled Meds: . amiodarone  200 mg Oral Daily  . calcitRIOL  0.5 mcg Oral Q T,Th,Sat-1800  . darbepoetin (ARANESP) injection - DIALYSIS  150 mcg Intravenous Q Thu-HD  . feeding supplement (NEPRO CARB STEADY)  237 mL Oral BID BM  . insulin aspart  0-15 Units Subcutaneous TID WC  . insulin aspart  0-5 Units Subcutaneous QHS  . insulin aspart  5 Units Subcutaneous TID WC  . insulin glargine  25 Units Subcutaneous QHS  . lanthanum  250 mg Oral TID WC  . sodium chloride flush  3 mL Intravenous Q12H  . sodium hypochlorite   Irrigation BID   Continuous Infusions: . sodium chloride    . cefTAZidime (FORTAZ)  IV Stopped (06/24/17 1211)     LOS: 17 days    Time spent: [place], MD [facility]  If 7PM-7AM, please contact night-coverage [email or web address] Password [facility] 06/25/2017, 3:43 PM

## 2017-06-25 NOTE — Plan of Care (Signed)
  Problem: Clinical Measurements: Goal: Ability to maintain clinical measurements within normal limits will improve Outcome: Progressing Goal: Diagnostic test results will improve 06/25/2017 0206 by Aris Lot, RN Outcome: Progressing 06/25/2017 0205 by Aris Lot, RN Outcome: Progressing Goal: Respiratory complications will improve 06/25/2017 0206 by Aris Lot, RN Outcome: Progressing 06/25/2017 0205 by Aris Lot, RN Outcome: Progressing Goal: Cardiovascular complication will be avoided 06/25/2017 0206 by Aris Lot, RN Outcome: Progressing 06/25/2017 0205 by Aris Lot, RN Outcome: Progressing   Problem: Elimination: Goal: Will not experience complications related to bowel motility 06/25/2017 0206 by Aris Lot, RN Outcome: Progressing 06/25/2017 0205 by Aris Lot, RN Outcome: Progressing   Problem: Safety: Goal: Ability to remain free from injury will improve Outcome: Progressing   Problem: Education: Goal: Knowledge of disease and its progression will improve Outcome: Progressing   Problem: Fluid Volume: Goal: Compliance with measures to maintain balanced fluid volume will improve Outcome: Progressing   Problem: Health Behavior/Discharge Planning: Goal: Ability to manage health-related needs will improve Outcome: Progressing   Problem: Nutritional: Goal: Ability to make healthy dietary choices will improve Outcome: Progressing   Problem: Physical Regulation: Goal: Complications related to the disease process, condition or treatment will be avoided or minimized Outcome: Progressing

## 2017-06-26 LAB — CBC
HEMATOCRIT: 30.4 % — AB (ref 39.0–52.0)
HEMOGLOBIN: 9.4 g/dL — AB (ref 13.0–17.0)
MCH: 28 pg (ref 26.0–34.0)
MCHC: 30.9 g/dL (ref 30.0–36.0)
MCV: 90.5 fL (ref 78.0–100.0)
Platelets: 365 10*3/uL (ref 150–400)
RBC: 3.36 MIL/uL — ABNORMAL LOW (ref 4.22–5.81)
RDW: 19.3 % — ABNORMAL HIGH (ref 11.5–15.5)
WBC: 27 10*3/uL — ABNORMAL HIGH (ref 4.0–10.5)

## 2017-06-26 LAB — RENAL FUNCTION PANEL
ALBUMIN: 2 g/dL — AB (ref 3.5–5.0)
ANION GAP: 10 (ref 5–15)
BUN: 37 mg/dL — ABNORMAL HIGH (ref 6–20)
CHLORIDE: 97 mmol/L — AB (ref 101–111)
CO2: 25 mmol/L (ref 22–32)
Calcium: 8.6 mg/dL — ABNORMAL LOW (ref 8.9–10.3)
Creatinine, Ser: 4.99 mg/dL — ABNORMAL HIGH (ref 0.61–1.24)
GFR calc Af Amer: 15 mL/min — ABNORMAL LOW (ref >60)
GFR calc non Af Amer: 13 mL/min — ABNORMAL LOW (ref >60)
GLUCOSE: 56 mg/dL — AB (ref 65–99)
PHOSPHORUS: 4 mg/dL (ref 2.5–4.6)
POTASSIUM: 4.9 mmol/L (ref 3.5–5.1)
Sodium: 132 mmol/L — ABNORMAL LOW (ref 135–145)

## 2017-06-26 LAB — GLUCOSE, CAPILLARY
GLUCOSE-CAPILLARY: 101 mg/dL — AB (ref 65–99)
Glucose-Capillary: 70 mg/dL (ref 65–99)
Glucose-Capillary: 143 mg/dL — ABNORMAL HIGH (ref 65–99)

## 2017-06-26 MED ORDER — APIXABAN 5 MG PO TABS
5.0000 mg | ORAL_TABLET | Freq: Two times a day (BID) | ORAL | Status: DC
  Administered 2017-06-26 – 2017-07-04 (×17): 5 mg via ORAL
  Filled 2017-06-26 (×19): qty 1

## 2017-06-26 MED ORDER — HEPARIN SODIUM (PORCINE) 1000 UNIT/ML DIALYSIS
40.0000 [IU]/kg | INTRAMUSCULAR | Status: DC | PRN
  Filled 2017-06-26: qty 5

## 2017-06-26 NOTE — Progress Notes (Signed)
Physical Therapy Wound Treatment Patient Details  Name: John Bailey MRN: [id number] Date of Birth: [birth year]  Today's Date: 06/26/2017 Time: 4403-4742 Time Calculation (min): 44 min  Subjective  Subjective: Pt remains to appear depressed on arrival but was pleasant and conversive with therapist and staff during tx.  Pt reports he misses spending time with his 45 yo son.   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 after surgery on 4/1 (Active)  Dressing Type ABD;Moist to dry 06/26/2017  5:00 PM  Dressing New drainage 06/26/2017  5:00 PM  Dressing Change Frequency Twice a day 06/26/2017  5:00 PM  State of Healing Early/partial granulation 06/26/2017  5:00 PM  Site / Wound Assessment Bleeding;Granulation tissue;Painful;Pink;Yellow 06/26/2017  5:00 PM  % Wound base Red or Granulating 75% 06/26/2017  5:00 PM  % Wound base Yellow/Fibrinous Exudate 25% 06/26/2017  5:00 PM  Peri-wound Assessment Intact;Pink 06/26/2017  5:00 PM  Wound Length (cm) 8.5 cm 06/23/2017 10:00 AM  Wound Width (cm) 6.5 cm 06/23/2017 10:00 AM  Wound Depth (cm) 4 cm 06/23/2017 10:00 AM  Wound Surface Area (cm^2) 55.25 cm^2 06/23/2017 10:00 AM  Wound Volume (cm^3) 221 cm^3 06/23/2017 10:00 AM  Tunneling (cm) 6cm at 5 o'clock 06/23/2017 10:00 AM  Undermining (cm) 2 cm 12-4 o'clock 06/23/2017 10:00 AM  Margins Unattached edges (unapproximated) 06/26/2017  5:00 PM  Drainage Amount Minimal 06/26/2017  5:00 PM  Drainage Description Serosanguineous;Odor 06/26/2017  5:00 PM  Treatment Cleansed;Debridement (Selective);Hydrotherapy (Pulse lavage);Packing (Saline gauze) 06/26/2017  5:00 PM      Hydrotherapy Pulsed lavage therapy - wound location: (sacrum) Pulsed Lavage with Suction (psi): 8 psi Pulsed Lavage with  Suction - Normal Saline Used: 1000 mL Pulsed Lavage Tip: Tip with splash shield Selective Debridement Selective Debridement - Location: scarum Selective Debridement - Tools Used: Forceps;Scissors Selective Debridement - Tissue Removed: (brown, gray, yellow necrosis.  )   Wound Assessment and Plan  Wound Therapy - Assess/Plan/Recommendations Wound Therapy - Clinical Statement: Pt continues to benefit from skilled hydrotherapy to decrease necrosis and bioburden and promote healing.  Significant decrease in odor noted.   Factors Delaying/Impairing Wound Healing: Immobility;Multiple medical problems;Diabetes Mellitus Hydrotherapy Plan: Pulsatile lavage with suction;Patient/family education;Dressing change Wound Therapy - Frequency: 6X / week Wound Therapy - Current Recommendations: Surgery consult;WOC nurse Wound Therapy - Follow Up Recommendations: Skilled nursing facility Wound Plan: see above  Wound Therapy Goals- Improve the function of patient's integumentary system by progressing the wound(s) through the phases of wound healing (inflammation - proliferation - remodeling) by: Decrease Necrotic Tissue to: 20 Decrease Necrotic Tissue - Progress: Progressing toward goal Increase Granulation Tissue to: 80 Increase Granulation Tissue - Progress: Progressing toward goal Goals/treatment plan/discharge plan were made with and agreed upon by patient/family: Yes Wound Therapy - Potential for Goals: Good  Goals will be updated until maximal potential achieved or discharge criteria met.  Discharge criteria: when goals achieved, discharge from hospital, MD decision/surgical intervention, no progress towards goals, refusal/missing three consecutive treatments without notification or medical reason.  GP     John Bailey Eli Hose 06/26/2017, 5:16 PM Governor Rooks, PTA pager [phone]

## 2017-06-26 NOTE — Progress Notes (Signed)
Patient ID: John Bailey, male   DOB: 02-Aug-1970, 47 y.o.   MRN: [id number] [facility] Progress Note   Assessment/ Plan:   1. Acute Blood Loss Anemia from gluteal abscess site- s/p PRBCs with appropriate rise in hemoglobin, will continue ESA (suspect resistance from infection/inflammatory complex of wound) 2. Sacral decubitus with osteomyelitis: Afebrile with persistent leukocytosis.  On ceftazidime with hemodialysis for osteomyelitis/sacral wound-Morganella (total duration 6 weeks). 3. ESRD: On HD today tocontinue with hemodialysis on a Tuesday/Thursday/Saturday schedule. He remains euvolemic and without any acute electrolyte abnormalities or symptoms to prompt additional intervention. 4. CKD-MBD:Start fosrenol with meals TIDAC 5. Nutrition:Continue renal diet with ongoing protein supplementation 6. Hypertension:stable and continue with UF. 7. Vascular access- LAVF placed 05/10/17-maturation appears to be on the sluggish side, continue using TDC at this time. 8. Atrial fibrillation- per primary 9. Rheumatoid arthritis flare- on prednisone taper. 10. Deconditioning- continue with PT/OT, he reports that he would never go to [facility] and wants to be discharged home if not able to qualify for CIR.  Subjective:   Reports to be feeling fair-frustrated by slow healing of decubitus ulcers/deconditioning.   Objective:   BP (!) 108/49   Pulse 93   Temp 97.7 F (36.5 C) (Oral)   Resp 16   Ht 6' (1.829 m)   Wt 122 kg (268 lb 15.4 oz)   SpO2 95%   BMI 36.48 kg/m   Physical Exam: Gen: Comfortable on hemodialysis CVS: Pulse regular rhythm, normal rate, normal S1 and S2 Resp: Diminished breath sounds with bases, no rales/rhonchi Abd: Soft, obese, nontender Ext: No lower extremity edema, palpable thrill left RCF  Labs: BMET Recent Labs  Lab 06/20/17 0254 06/22/17 0753 06/24/17 0647 06/25/17 0710  NA 133* 134* 136 134*  K 3.3* 3.1* 4.2 5.2*  CL 94* 97* 99* 99*  CO2  22 24 23 23   GLUCOSE 185* 142* 113* 94  BUN 18 52* 40* 26*  CREATININE 4.21* 6.75* 5.44* 3.84*  CALCIUM 7.9* 8.4* 8.8* 8.7*  PHOS  --  5.5* 4.8* 3.7   CBC Recent Labs  Lab 06/20/17 0254 06/21/17 0326 06/22/17 0753 06/24/17 0647 06/25/17 0710  WBC 29.2* 26.6* 24.5* 30.5* 29.6*  NEUTROABS 27.1* 23.9*  --  28.4* 25.7*  HGB 7.3* 7.1* 6.9* 9.6* 10.0*  HCT 23.3* 22.4* 20.9* 30.0* 30.9*  MCV 86.0 83.9 84.3 88.2 89.0  PLT 413* 453* 468* 414* 395   Medications:    . amiodarone  200 mg Oral Daily  . calcitRIOL  0.5 mcg Oral Q T,Th,Sat-1800  . darbepoetin (ARANESP) injection - DIALYSIS  150 mcg Intravenous Q Thu-HD  . feeding supplement (NEPRO CARB STEADY)  237 mL Oral BID BM  . insulin aspart  0-15 Units Subcutaneous TID WC  . insulin aspart  0-5 Units Subcutaneous QHS  . insulin aspart  5 Units Subcutaneous TID WC  . insulin glargine  25 Units Subcutaneous QHS  . lanthanum  250 mg Oral TID WC  . sodium chloride flush  3 mL Intravenous Q12H  . sodium hypochlorite   Irrigation BID   Elmarie Shiley, MD 06/26/2017, 9:22 AM

## 2017-06-26 NOTE — Progress Notes (Signed)
PT Cancellation Note  Patient Details Name: JAVYON FONTAN MRN: [id number] DOB: 11-07-1970   Cancelled Treatment:    Reason Eval/Treat Not Completed: (P) Patient at procedure or test/unavailable(Pt off unit for HD.  Will continue efforts per POC.  )   Mihcael Ledee Eli Hose 06/26/2017, 9:29 AM  Governor Rooks, PTA pager [phone]

## 2017-06-26 NOTE — Progress Notes (Signed)
No behavior issues this shift. Patient states needs have been met.

## 2017-06-26 NOTE — Procedures (Signed)
Patient seen on Hemodialysis. QB 400, UF goal 2L Treatment adjusted as needed.  John Shiley MD [facility]. Office # [phone] Pager # [phone] 9:33 AM

## 2017-06-26 NOTE — Progress Notes (Signed)
Patient is in hemodialysis. Will assess when patient gets back to the unit.

## 2017-06-26 NOTE — Progress Notes (Signed)
Pharmacy Antibiotic Note  John Bailey is a 47 y.o. male admitted on 06/08/2017 with osteo and sacral decubitus ulcer on hydrotherapy.  Pharmacy is consulted to dose [place]. Per ID will need 6 weeks tx.  -abscess cultures with morganella morganii (sens to rocephin) -Has been tolerating HD with last HD on 4/13   Plan: -Continue Fortaz  2gm IV with HD TTS (6 weeks) -Will follow new cultures and clinical progress   Height: 6' (182.9 cm) Weight: (bed scale not working) IBW/kg (Calculated) : 77.6  Temp (24hrs), Avg:98.1 F (36.7 C), Min:97.7 F (36.5 C), Max:98.7 F (37.1 C)  Recent Labs  Lab 06/20/17 0254 06/21/17 0326 06/22/17 0753 06/24/17 0647 06/25/17 0710 06/26/17 0955 06/26/17 0956  WBC 29.2* 26.6* 24.5* 30.5* 29.6*  --  27.0*  CREATININE 4.21*  --  6.75* 5.44* 3.84* 4.99*  --     Estimated Creatinine Clearance: 25.5 mL/min (A) (by C-G formula based on SCr of 4.99 mg/dL (H)).    No Known Allergies  Antimicrobials this admission:  Fortaz 4/3>>(5/13) Zosyn 4/1 >> 4/3 Vanc 4/1 >> 4/4  Dose adjustments this admission:   Microbiology results:  3/31 BCx: 1/2 MRSE (contamination) 4/1 abscess - morganella morganii (sens to rocephin) 4/3 blood x2- ngf 3/26 & 4/2 MRSA PCR neg   Thank you for allowing [place] to participate in this patients care.  Maryanna Shape, PharmD, BCPS  Clinical Pharmacist  Pager: [phone]   06/26/2017 12:37 PM

## 2017-06-26 NOTE — Progress Notes (Signed)
Physical Therapy Treatment Patient Details Name: John Bailey MRN: [id number] DOB: [birth year] Today's Date: 06/26/2017    History of Present Illness Pt is a 47 y.o. male with history of RA, PVD, HTN, ESRD on HD, A. fib, DM, CHF and sacral/coccyx pressure ulcer, who was admitted from 04/22/2017-05/13/2017 with anasarca from CHF and A. fib with RVR (cardioverted); was d/c to CIR for debility. He underwent debridement of sacral decubitus ulcer on 06/04/2017 by general surgery.  Pt was supposed to discharge from CIR on 06/09/2017 but general surgery recommended continued inpatient wound care, patient was readmitted on 06/09/2017.    PT Comments    Pt received in bed ready to participate in PT.  Pt was Max A to do partial log roll to R and sit to supine with HOB elevated.  Pt needing verbal cues for all sequencing, technique, and hand placement.  Once sitting at EOB pt can maintain balance using bilateral UE support but needs total A to donn/doff both shoes/slippers.  He attempted two standing trials with Stedy.  First trial was total A +2 and ended w/pt sitting at EOB.  Second standing trial total A +3 with the third person used to put Stedy seat in place while pt held in standing.   Pt was left sitting upright in WC with phone/call button within reach waiting for nursing to take him outside.  Emphasis should be placed on patient motivation to participate in functional mobility to regain functional independence.  He remains to need SNF or LTACH due to needing Max-Total A for all mobility and tranfers.       Follow Up Recommendations  SNF;LTACH     Equipment Recommendations  Other (comment)    Recommendations for Other Services OT consult     Precautions / Restrictions Precautions Precautions: Fall Precaution Comments: Severe RA in shoulders, left hand Restrictions Weight Bearing Restrictions: No    Mobility  Bed Mobility Overal bed mobility: Needs Assistance Bed Mobility: Rolling;Sidelying  to Sit Rolling: +2 for physical assistance;Max assist Sidelying to sit: HOB elevated;Max assist;+2 for physical assistance       General bed mobility comments: VC's needed for sequencing, pt limited by anxiety/fear of pain.  Transfers Overall transfer level: Needs assistance Equipment used: None Transfers: Sit to/from Stand Sit to Stand: Total assist;+2 physical assistance(+3, 2 people lifting with a third to put seat down while pt held in standing)         General transfer comment: 2 standing trials with Stedy.  Trial 1 was +2 in which the pt had to sit back down.  Trial 2 was +3, 2 people to lift and a third to set the seat in place.  Pt limited by anxiety due to pain.    Ambulation/Gait                 Stairs             Wheelchair Mobility    Modified Rankin (Stroke Patients Only)       Balance Overall balance assessment: Needs assistance Sitting-balance support: Feet supported;Bilateral upper extremity supported   Sitting balance - Comments: Able to statically sit at EOB without external assistance.    Standing balance support: Bilateral upper extremity supported   Standing balance comment: Unable to power up to standing.                             Cognition Arousal/Alertness: Awake/alert Behavior During Therapy:  WFL for tasks assessed/performed Overall Cognitive Status: Impaired/Different from baseline Area of Impairment: Problem solving                   Current Attention Level: Selective Memory: Decreased short-term memory Following Commands: Follows multi-step commands with increased time Safety/Judgement: Decreased awareness of safety;Decreased awareness of deficits Awareness: Emergent Problem Solving: Slow processing General Comments: Pt with slower processing at times.       Exercises      General Comments        Pertinent Vitals/Pain Pain Assessment: Faces Faces Pain Scale: Hurts even more Pain Location:  B knees (R>L), sacral wound with repositioning Pain Descriptors / Indicators: Discomfort;Grimacing Pain Intervention(s): Limited activity within patient's tolerance;Monitored during session;Repositioned;Premedicated before session    Home Living                      Prior Function            PT Goals (current goals can now be found in the care plan section) Acute Rehab PT Goals Patient Stated Goal: Feel better PT Goal Formulation: With patient Time For Goal Achievement: 06/28/17 Potential to Achieve Goals: Fair Progress towards PT goals: Progressing toward goals    Frequency    Min 2X/week      PT Plan Current plan remains appropriate    Co-evaluation     PT goals addressed during session: Mobility/safety with mobility OT goals addressed during session: ADL's and self-care      AM-PAC PT "6 Clicks" Daily Activity  Outcome Measure  Difficulty turning over in bed (including adjusting bedclothes, sheets and blankets)?: Unable Difficulty moving from lying on back to sitting on the side of the bed? : Unable Difficulty sitting down on and standing up from a chair with arms (e.g., wheelchair, bedside commode, etc,.)?: Unable Help needed moving to and from a bed to chair (including a wheelchair)?: Total Help needed walking in hospital room?: Total   6 Click Score: 5    End of Session Equipment Utilized During Treatment: Gait belt;Other (comment)(Stedy used for bed to wc transfer) Activity Tolerance: Patient limited by pain Patient left: in chair;with call bell/phone within reach;Other (comment)(Pt left in [facility] waiting for nurses to take him outside. ) Nurse Communication: Mobility status PT Visit Diagnosis: Other abnormalities of gait and mobility (R26.89);Muscle weakness (generalized) (M62.81)     Time: 1437-1500 PT Time Calculation (min) (ACUTE ONLY): 23 min  Charges:  $Therapeutic Activity: 23-37 mins                    G Codes:       Terri Skains, SPTA  [phone]   Terri Skains 06/26/2017, 3:56 PM

## 2017-06-26 NOTE — Progress Notes (Signed)
Hospitalist progress note   John Bailey  OVF:[id number] DOB: [birth year] DOA: 06/08/2017 PCP: [name], MD Specialists:  Infectious disease Renal  Brief Narrative:  47 year old rheumatoid arthritis, PVD, ESRD TTS, A. fib chads score >3 Eliquis, diabetes mellitus type 2, chronic diastolic heart failure, pressure ulcers Readmitted from CIR 06/09/2017 for debridement inpatient wound care, developed fever and seen by infectious disease  Assessment & Plan:  Right gluteal abscess Status post debridement 06/14/17 Wound culture = Morganella, continue ceftazadime for 6 weeks stop date 07/25/2017-foll c ID in 4 wk Febrile 06/19/17-diarrhea at the time not felt to be C. difficile --no further workup Some pain and discomfort still-pain management as per general surgery.  Nursing has been requested to look in on patient and document clearly shift events and goals-I have discussed this with the charge nurse on the floor 06/26/2017  will evaluate for further hydrotherapy And wound management and deferred to general surgery planning  Acute blood loss anemia-excessive bleeding 4/6 surgeons packed wound and held Eliquis on at that time Hemoglobin 6.9 transfused 2 on 4/9 Hemoglobin stable 9.6And then went up to 10--currently 9.4  Unstageable sacral ulcer 3/22 Discussed with Dr. Rosendo Gros and if able to place a VAC at next venue of care will do the same otherwise wet-to-dry dressings 3 times daily for week and will revisit with Dr. Rosendo Gros as an outpatient Awaiting LTAC level of care if patient still agreeable on 4/15  ESRD on HD TTS HD as per [facility]-Phos 5.5--->3.7 cont fosrenal 250 tid,  Calcitriol 0.5 tts, aranesp1 150 q thurs   afib Chad>2/eliquis\-resumed Eliquis 4/13 Cont amio 200 mg, holding coreg  Chronic diastolic heart failure Managed at HD  Rheumatoid arthritis flare on abatacept Currently on prednisone taper 30 mg-we will cut back dose to 20 mg  White count slowly trending to 27 down  from 30 He follows with a rheumatologist and [place] and was diagnosed last year with the same he will need a very slow taper which will help him heal from his deep wounds  Body mass index is 36.48 kg/m.    DVT prophylaxis: full  Code Status:   Ip   Family Communication:    none Disposition Plan: per gen surgery    Consultants:   Renal  gen surg  Procedures:   none  Antimicrobials:   ceftazidime   Subjective:  Awake alert pleasant no distress no nausea no vomiting no chest pain Seen on HD unit States "my night was much better last night"  Objective: Vitals:   06/26/17 1100 06/26/17 1104 06/26/17 1232 06/26/17 1510  BP: (!) 92/51 110/60 112/82 102/76  Pulse: 98 90 (!) 104 (!) 125  Resp:  16 18 20   Temp:  98 F (36.7 C) 98.7 F (37.1 C) 97.8 F (36.6 C)  TempSrc:  Oral Oral Oral  SpO2:   100% 100%  Weight:      Height:        Intake/Output Summary (Last 24 hours) at 06/26/2017 1609 Last data filed at 06/26/2017 1104 Gross per 24 hour  Intake 203 ml  Output 1478 ml  Net -1275 ml   Filed Weights   06/24/17 1140 06/24/17 1207 06/25/17 0500  Weight: 122.9 kg (270 lb 15.1 oz) 122.9 kg (271 lb) 122 kg (268 lb 15.4 oz)    Examination:  oriented n EOMI NCAT S1-S2 sinus Abdomen soft Patient in dialysis bed still cannot examine at present   Data Reviewed: I have personally reviewed following labs and  imaging studies  CBC: Recent Labs  Lab 06/20/17 0254 06/21/17 0326 06/22/17 0753 06/24/17 0647 06/25/17 0710 06/26/17 0956  WBC 29.2* 26.6* 24.5* 30.5* 29.6* 27.0*  NEUTROABS 27.1* 23.9*  --  28.4* 25.7*  --   HGB 7.3* 7.1* 6.9* 9.6* 10.0* 9.4*  HCT 23.3* 22.4* 20.9* 30.0* 30.9* 30.4*  MCV 86.0 83.9 84.3 88.2 89.0 90.5  PLT 413* 453* 468* 414* 395 427   Basic Metabolic Panel: Recent Labs  Lab 06/20/17 0254 06/21/17 0326 06/22/17 0753 06/24/17 0647 06/25/17 0710 06/26/17 0955  NA 133*  --  134* 136 134* 132*  K 3.3*  --  3.1* 4.2 5.2*  4.9  CL 94*  --  97* 99* 99* 97*  CO2 22  --  24 23 23 25   GLUCOSE 185*  --  142* 113* 94 56*  BUN 18  --  52* 40* 26* 37*  CREATININE 4.21*  --  6.75* 5.44* 3.84* 4.99*  CALCIUM 7.9*  --  8.4* 8.8* 8.7* 8.6*  MG 1.9 2.0  --   --   --   --   PHOS  --   --  5.5* 4.8* 3.7 4.0   GFR: Estimated Creatinine Clearance: 25.5 mL/min (A) (by C-G formula based on SCr of 4.99 mg/dL (H)). Liver Function Tests: Recent Labs  Lab 06/22/17 0753 06/24/17 0647 06/25/17 0710 06/26/17 0955  ALBUMIN 1.7* 2.0* 2.1* 2.0*   No results for input(s): LIPASE, AMYLASE in the last 168 hours. No results for input(s): AMMONIA in the last 168 hours. Coagulation Profile: No results for input(s): INR, PROTIME in the last 168 hours. Cardiac Enzymes:  Radiology Studies: Reviewed images personally in health database   Scheduled Meds: . amiodarone  200 mg Oral Daily  . apixaban  5 mg Oral BID  . calcitRIOL  0.5 mcg Oral Q T,Th,Sat-1800  . darbepoetin (ARANESP) injection - DIALYSIS  150 mcg Intravenous Q Thu-HD  . feeding supplement (NEPRO CARB STEADY)  237 mL Oral BID BM  . insulin aspart  0-15 Units Subcutaneous TID WC  . insulin aspart  0-5 Units Subcutaneous QHS  . insulin aspart  5 Units Subcutaneous TID WC  . insulin glargine  25 Units Subcutaneous QHS  . lanthanum  250 mg Oral TID WC  . sodium chloride flush  3 mL Intravenous Q12H  . sodium hypochlorite   Irrigation BID   Continuous Infusions: . sodium chloride    . cefTAZidime (FORTAZ)  IV Stopped (06/26/17 1243)     LOS: 18 days    Time spent: [place], MD [facility]  If 7PM-7AM, please contact night-coverage [email or web address] Password [facility] 06/26/2017, 4:09 PM

## 2017-06-27 DIAGNOSIS — M4628 Osteomyelitis of vertebra, sacral and sacrococcygeal region: Secondary | ICD-10-CM

## 2017-06-27 LAB — GLUCOSE, CAPILLARY
GLUCOSE-CAPILLARY: 123 mg/dL — AB (ref 65–99)
GLUCOSE-CAPILLARY: 134 mg/dL — AB (ref 65–99)
GLUCOSE-CAPILLARY: 44 mg/dL — AB (ref 65–99)
GLUCOSE-CAPILLARY: 138 mg/dL — AB (ref 65–99)
Glucose-Capillary: 60 mg/dL — ABNORMAL LOW (ref 65–99)
Glucose-Capillary: 42 mg/dL — CL (ref 65–99)
Glucose-Capillary: 54 mg/dL — ABNORMAL LOW (ref 65–99)

## 2017-06-27 MED ORDER — DEXTROSE 50 % IV SOLN
INTRAVENOUS | Status: AC
  Administered 2017-06-27: 25 mL
  Filled 2017-06-27: qty 50

## 2017-06-27 NOTE — Progress Notes (Signed)
No behavioral issues overnight.

## 2017-06-27 NOTE — Progress Notes (Signed)
Patient ID: John Bailey, male   DOB: 1970/05/01, 47 y.o.   MRN: [id number] [facility] Progress Note   Assessment/ Plan:   1. Sacral decubitus with osteomyelitis, gluteal abscess: Afebrile with persistent leukocytosis.  On ceftazidime with hemodialysis for osteomyelitis/sacral wound-Morganella (antibiotic stop date 07/25/17). 2. ESRD: Continue hemodialysis on a Tuesday/Thursday/Saturday schedule. He remains euvolemic and without any acute electrolyte abnormalities at this time after hemodialysis yesterday.  At this time, he is unable to transfer and therefore unable to participate in safe outpatient hemodialysis. 3. Anemia: He has anemia of chronic kidney disease/acute illness that was compounded by acute blood loss anemia from bleeding from gluteal abscess incision and drainage site.  Improved with PRBC transfusion and currently stable. 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 5. Nutrition:Continue renal diet with ongoing protein supplementation for improvement of wound healing. 6. Vascular access- LAVF placed 05/10/17-maturation appears to be on the sluggish side, continue using TDC at this time. 7. Atrial fibrillation-rate controlled and on anticoagulation with Eliquis 8. Deconditioning: Case management working closely with patient to help decide on disposition.  Subjective:   Frustrated by slow progress with wound healing and continued debilitation/inability to transfer.  He states that he would not go to an LTAC and would rather go to a skilled nursing facility-we discussed the limitations of this pertaining to outpatient hemodialysis.   Objective:   BP 106/72 (BP Location: Right Arm)   Pulse 89   Temp 98.8 F (37.1 C) (Oral)   Resp 17   Ht 6' (1.829 m)   Wt 120.7 kg (266 lb)   SpO2 98%   BMI 36.08 kg/m   Physical Exam: Gen: Comfortable resting in bed, appears withdrawn CVS: Pulse regular  rhythm, normal rate, normal S1 and S2 Resp: Diminished breath sounds with bases, no rales/rhonchi Abd: Soft, obese, nontender Ext: No lower extremity edema, palpable thrill left RCF  Labs: BMET Recent Labs  Lab 06/22/17 0753 06/24/17 0647 06/25/17 0710 06/26/17 0955  NA 134* 136 134* 132*  K 3.1* 4.2 5.2* 4.9  CL 97* 99* 99* 97*  CO2 24 23 23 25   GLUCOSE 142* 113* 94 56*  BUN 52* 40* 26* 37*  CREATININE 6.75* 5.44* 3.84* 4.99*  CALCIUM 8.4* 8.8* 8.7* 8.6*  PHOS 5.5* 4.8* 3.7 4.0   CBC Recent Labs  Lab 06/21/17 0326 06/22/17 0753 06/24/17 0647 06/25/17 0710 06/26/17 0956  WBC 26.6* 24.5* 30.5* 29.6* 27.0*  NEUTROABS 23.9*  --  28.4* 25.7*  --   HGB 7.1* 6.9* 9.6* 10.0* 9.4*  HCT 22.4* 20.9* 30.0* 30.9* 30.4*  MCV 83.9 84.3 88.2 89.0 90.5  PLT 453* 468* 414* 395 365   Medications:    . amiodarone  200 mg Oral Daily  . apixaban  5 mg Oral BID  . calcitRIOL  0.5 mcg Oral Q T,Th,Sat-1800  . darbepoetin (ARANESP) injection - DIALYSIS  150 mcg Intravenous Q Thu-HD  . feeding supplement (NEPRO CARB STEADY)  237 mL Oral BID BM  . insulin aspart  0-15 Units Subcutaneous TID WC  . insulin aspart  0-5 Units Subcutaneous QHS  . insulin aspart  5 Units Subcutaneous TID WC  . insulin glargine  25 Units Subcutaneous QHS  . lanthanum  250 mg Oral TID WC  . sodium chloride flush  3 mL Intravenous Q12H   Elmarie Shiley, MD 06/27/2017, 11:28 AM

## 2017-06-27 NOTE — Progress Notes (Signed)
Hospitalist progress note   John Bailey  QAS:[id number] DOB: Oct 10, 1970 DOA: 06/08/2017 PCP: [name], MD Specialists:  Infectious disease Renal  Brief Narrative:  47 year old rheumatoid arthritis, PVD, ESRD TTS, A. fib chads score >3 Eliquis, diabetes mellitus type 2, chronic diastolic heart failure, pressure ulcers Readmitted from CIR 06/09/2017 for debridement inpatient wound care, developed fever and seen by infectious disease  Assessment & Plan:  Right gluteal abscess Status post debridement 06/14/17 Wound culture = Morganella, continue ceftazadime for 6 weeks stop date 07/25/2017-foll c ID in 4 wk Febrile 06/19/17-diarrhea at the time not felt to be C. difficile --no further workup Some pain and discomfort still-pain management as per general surgery.  will evaluate for further hydrotherapy And wound management and deferred to general surgery planning  Acute blood loss anemia-excessive bleeding 4/6 surgeons packed wound and held Eliquis on at that time Hemoglobin 6.9 transfused 2 on 4/9 Hemoglobin stable 9.6And then went up to 10--currently 9.4 Rechecking labs with HD a.m.  Unstageable sacral ulcer 3/22 Discussed with Dr. Rosendo Gros 4/10  and if able to place a VAC at next venue of care will do the same otherwise wet-to-dry dressings 3 times daily for week and will revisit with Dr. Rosendo Gros as an outpatient -He needed further hydrotherapy and debridement last Friday Awaiting LTAC level of care if patient still agreeable on 4/15--he states he does not want to go to [facility] and it is unclear if he can transfer and sit at dialysis  ESRD on HD TTS HD as per [facility] cont fosrenal 250 tid,  Calcitriol 0.5 tts, aranesp1 150 q thurs   afib Chad>2/eliquis\-resumed Eliquis 4/13 Cont amio 200 mg, holding coreg  Chronic diastolic heart failure Managed at HD  Rheumatoid arthritis flare on abatacept Currently on prednisone taper 30 mg-we will cut back dose to 20 mg  White count slowly  trending to 27 down from 30 He follows with a rheumatologist and [place] and was diagnosed last year with the same he will need a very slow taper which will help him heal from his deep wounds   Body mass index is 36.08 kg/m.    DVT prophylaxis: full  Code Status:   Ip   Family Communication:    none Disposition Plan: per gen surgery    Consultants:   Renal  gen surg  Procedures:   none  Antimicrobials:   ceftazidime   Subjective:  Awake sitting in bed no distress about to have lunch Multiple family members and church relatives are nearby He is not having pain no fever He is adamant about not going to [facility] He wants to try to go to a skilled facility eventually I explained to him that therapy cannot come to see him in an acute care setting more than 3 times a week and this was gone over with him with nurse present as well for reinforcement   Objective: Vitals:   06/26/17 2031 06/27/17 0112 06/27/17 0513 06/27/17 1328  BP: 106/65  106/72 115/73  Pulse: (!) 102  89 (!) 101  Resp: 18  17 20   Temp: 98.3 F (36.8 C)  98.8 F (37.1 C) 99 F (37.2 C)  TempSrc: Oral  Oral Oral  SpO2: 98%  98% 100%  Weight:  120.7 kg (266 lb)    Height:        Intake/Output Summary (Last 24 hours) at 06/27/2017 1740 Last data filed at 06/27/2017 1700 Gross per 24 hour  Intake 340 ml  Output -  Net 340 ml   Filed Weights   06/25/17 0500 06/27/17 0112  Weight: 122 kg (268 lb 15.4 oz) 120.7 kg (266 lb)    Examination:  Pleasant alert oriented cushingoid habitus no pallor no icterus thick neck Abdomen soft nontender no rebound no guarding S1-S2 no murmur rub or gallop Wound not visualized today Lower extremities is soft without edema Flat affect    Data Reviewed: I have personally reviewed following labs and imaging studies  CBC: Recent Labs  Lab 06/21/17 0326 06/22/17 0753 06/24/17 0647 06/25/17 0710 06/26/17 0956  WBC 26.6* 24.5* 30.5* 29.6* 27.0*  NEUTROABS  23.9*  --  28.4* 25.7*  --   HGB 7.1* 6.9* 9.6* 10.0* 9.4*  HCT 22.4* 20.9* 30.0* 30.9* 30.4*  MCV 83.9 84.3 88.2 89.0 90.5  PLT 453* 468* 414* 395 250   Basic Metabolic Panel: Recent Labs  Lab 06/21/17 0326 06/22/17 0753 06/24/17 0647 06/25/17 0710 06/26/17 0955  NA  --  134* 136 134* 132*  K  --  3.1* 4.2 5.2* 4.9  CL  --  97* 99* 99* 97*  CO2  --  24 23 23 25   GLUCOSE  --  142* 113* 94 56*  BUN  --  52* 40* 26* 37*  CREATININE  --  6.75* 5.44* 3.84* 4.99*  CALCIUM  --  8.4* 8.8* 8.7* 8.6*  MG 2.0  --   --   --   --   PHOS  --  5.5* 4.8* 3.7 4.0   GFR: Estimated Creatinine Clearance: 25.3 mL/min (A) (by C-G formula based on SCr of 4.99 mg/dL (H)). Liver Function Tests: Recent Labs  Lab 06/22/17 0753 06/24/17 0647 06/25/17 0710 06/26/17 0955  ALBUMIN 1.7* 2.0* 2.1* 2.0*   No results for input(s): LIPASE, AMYLASE in the last 168 hours. No results for input(s): AMMONIA in the last 168 hours. Coagulation Profile: No results for input(s): INR, PROTIME in the last 168 hours. Cardiac Enzymes:  Radiology Studies: Reviewed images personally in health database   Scheduled Meds: . amiodarone  200 mg Oral Daily  . apixaban  5 mg Oral BID  . calcitRIOL  0.5 mcg Oral Q T,Th,Sat-1800  . darbepoetin (ARANESP) injection - DIALYSIS  150 mcg Intravenous Q Thu-HD  . feeding supplement (NEPRO CARB STEADY)  237 mL Oral BID BM  . insulin aspart  0-15 Units Subcutaneous TID WC  . insulin aspart  0-5 Units Subcutaneous QHS  . insulin aspart  5 Units Subcutaneous TID WC  . insulin glargine  25 Units Subcutaneous QHS  . lanthanum  250 mg Oral TID WC  . sodium chloride flush  3 mL Intravenous Q12H   Continuous Infusions: . sodium chloride    . cefTAZidime (FORTAZ)  IV Stopped (06/26/17 1243)     LOS: 19 days    Time spent: Hooker, MD [facility]  If 7PM-7AM, please contact night-coverage [email or web address] Password [facility] 06/27/2017, 5:40 PM

## 2017-06-28 LAB — CBC WITH DIFFERENTIAL/PLATELET
BAND NEUTROPHILS: 0 %
BASOS ABS: 0 10*3/uL (ref 0.0–0.1)
BLASTS: 0 %
Basophils Relative: 0 %
EOS PCT: 0 %
Eosinophils Absolute: 0 10*3/uL (ref 0.0–0.7)
HEMATOCRIT: 33 % — AB (ref 39.0–52.0)
Hemoglobin: 10.5 g/dL — ABNORMAL LOW (ref 13.0–17.0)
LYMPHS ABS: 1 10*3/uL (ref 0.7–4.0)
Lymphocytes Relative: 4 %
MCH: 28.8 pg (ref 26.0–34.0)
MCHC: 31.8 g/dL (ref 30.0–36.0)
MCV: 90.7 fL (ref 78.0–100.0)
METAMYELOCYTES PCT: 0 %
MONOS PCT: 0 %
Monocytes Absolute: 0 10*3/uL — ABNORMAL LOW (ref 0.1–1.0)
Myelocytes: 0 %
NEUTROS ABS: 23.3 10*3/uL — AB (ref 1.7–7.7)
Neutrophils Relative %: 96 %
PLATELETS: 297 10*3/uL (ref 150–400)
Promyelocytes Relative: 0 %
RBC: 3.64 MIL/uL — AB (ref 4.22–5.81)
RDW: 19.3 % — AB (ref 11.5–15.5)
WBC: 24.3 10*3/uL — ABNORMAL HIGH (ref 4.0–10.5)
nRBC: 0 /100 WBC

## 2017-06-28 LAB — RENAL FUNCTION PANEL
Albumin: 2 g/dL — ABNORMAL LOW (ref 3.5–5.0)
Anion gap: 14 (ref 5–15)
BUN: 25 mg/dL — AB (ref 6–20)
CHLORIDE: 96 mmol/L — AB (ref 101–111)
CO2: 24 mmol/L (ref 22–32)
Calcium: 8.7 mg/dL — ABNORMAL LOW (ref 8.9–10.3)
Creatinine, Ser: 5.16 mg/dL — ABNORMAL HIGH (ref 0.61–1.24)
GFR calc Af Amer: 14 mL/min — ABNORMAL LOW (ref >60)
GFR, EST NON AFRICAN AMERICAN: 12 mL/min — AB (ref >60)
GLUCOSE: 114 mg/dL — AB (ref 65–99)
POTASSIUM: 4.3 mmol/L (ref 3.5–5.1)
Phosphorus: 3.7 mg/dL (ref 2.5–4.6)
Sodium: 134 mmol/L — ABNORMAL LOW (ref 135–145)

## 2017-06-28 LAB — GLUCOSE, CAPILLARY
GLUCOSE-CAPILLARY: 122 mg/dL — AB (ref 65–99)
GLUCOSE-CAPILLARY: 144 mg/dL — AB (ref 65–99)
Glucose-Capillary: 109 mg/dL — ABNORMAL HIGH (ref 65–99)
Glucose-Capillary: 147 mg/dL — ABNORMAL HIGH (ref 65–99)

## 2017-06-28 MED ORDER — DAKINS (1/4 STRENGTH) 0.125 % EX SOLN
Freq: Every day | CUTANEOUS | Status: AC
  Administered 2017-06-28 – 2017-06-30 (×3): via TOPICAL
  Filled 2017-06-28: qty 473

## 2017-06-28 NOTE — Progress Notes (Addendum)
Patient ID: John Bailey, male   DOB: 1970-11-27, 47 y.o.   MRN: [id number]    14 Days Post-Op  Subjective: No complaints  Objective: Vital signs in last 24 hours: Temp:  [99 F (37.2 C)-99.5 F (37.5 C)] 99.5 F (37.5 C) (04/15 0507) Pulse Rate:  [99-101] 99 (04/15 0507) Resp:  [16-20] 16 (04/15 0507) BP: (112-118)/(64-80) 112/80 (04/15 0507) SpO2:  [96 %-100 %] 96 % (04/15 0507) Weight:  [120.7 kg (266 lb)] 120.7 kg (266 lb) (04/15 0500) Last BM Date: 06/24/17  Intake/Output from previous day: 04/14 0701 - 04/15 0700 In: 340 [P.O.:240; IV Piggyback:100] Out: -  Intake/Output this shift: No intake/output data recorded.  PE: Skin: both sacral and right gluteal wounds are essentially clean.  Sacral wound has a thin layer of fibrin on the base that is tightly stuck and not coming off even with hydrotherapy.  Both wounds tunnel in the 4 oclock position.  There is about 1 cm of tissue before these are connecting.   Lab Results:  Recent Labs    06/26/17 0956 06/28/17 0655  WBC 27.0* 24.3*  HGB 9.4* 10.5*  HCT 30.4* 33.0*  PLT 365 297   BMET Recent Labs    06/26/17 0955 06/28/17 0655  NA 132* 134*  K 4.9 4.3  CL 97* 96*  CO2 25 24  GLUCOSE 56* 114*  BUN 37* 25*  CREATININE 4.99* 5.16*  CALCIUM 8.6* 8.7*   PT/INR No results for input(s): LABPROT, INR in the last 72 hours. CMP     Component Value Date/Time   NA 134 (L) 06/28/2017 0655   K 4.3 06/28/2017 0655   CL 96 (L) 06/28/2017 0655   CO2 24 06/28/2017 0655   GLUCOSE 114 (H) 06/28/2017 0655   BUN 25 (H) 06/28/2017 0655   CREATININE 5.16 (H) 06/28/2017 0655   CALCIUM 8.7 (L) 06/28/2017 0655   PROT 5.7 (L) 06/15/2017 0240   ALBUMIN 2.0 (L) 06/28/2017 0655   AST 23 06/15/2017 0240   ALT 16 (L) 06/15/2017 0240   ALKPHOS 96 06/15/2017 0240   BILITOT 0.8 06/15/2017 0240   GFRNONAA 12 (L) 06/28/2017 0655   GFRAA 14 (L) 06/28/2017 0655   Lipase  No results found for:  LIPASE     Studies/Results: No results found.  Anti-infectives: Anti-infectives (From admission, onward)   Start     Dose/Rate Route Frequency Ordered Stop   06/17/17 1200  cefTAZidime (FORTAZ) 2 g in sodium chloride 0.9 % 100 mL IVPB     2 g 200 mL/hr over 30 Minutes Intravenous Every T-Th-Sa (Hemodialysis) 06/16/17 1001 07/25/17 2359   06/17/17 0837  vancomycin (VANCOCIN) 1-5 GM/200ML-% IVPB    Note to Pharmacy:  Louretta Shorten   : cabinet override      06/17/17 0837 06/17/17 1123   06/16/17 1100  cefTAZidime (FORTAZ) 2 g in sodium chloride 0.9 % 100 mL IVPB     2 g 200 mL/hr over 30 Minutes Intravenous  Once 06/16/17 1001 06/16/17 1137   06/15/17 1502  vancomycin (VANCOCIN) 1-5 GM/200ML-% IVPB    Note to Pharmacy:  Herriott, Melisa   : cabinet override      06/15/17 1502 06/15/17 1553   06/15/17 1200  vancomycin (VANCOCIN) IVPB 1000 mg/200 mL premix  Status:  Discontinued     1,000 mg 200 mL/hr over 60 Minutes Intravenous Every T-Th-Sa (Hemodialysis) 06/14/17 1256 06/17/17 1153   06/14/17 1330  vancomycin (VANCOCIN) 2,000 mg in sodium chloride 0.9 % 500 mL  IVPB     2,000 mg 250 mL/hr over 120 Minutes Intravenous  Once 06/14/17 1256 06/14/17 2229   06/14/17 1130  piperacillin-tazobactam (ZOSYN) IVPB 3.375 g  Status:  Discontinued     3.375 g 12.5 mL/hr over 240 Minutes Intravenous Every 12 hours 06/14/17 1057 06/16/17 0936       Assessment/Plan AF/DVT - on Eliquis DCHF/NICM ESRD HD - TTS Cardiorenal syndrome Diabetes ID - peripheral neuropathy Hypertension RA - on steroids Deconditioning- cardiorenal syndrome Anemia- received 1 uPRBC 4/5 and 1 unit 4/6, holding eliquis, hg now stable at 7.1   Sacral ulcer S/pDEBRIDMENT OF SACRAL DECUBITUS ULCER [id number] Dr. Melanee Spry -Dakin's ends today.  Resume WD dressing changes.  Would like for fibrin tissue at base to improve prior to [facility] placement -cont hydro Right gluteal abscess S/pDebridement of 70cm^2  tissue involving subcutaneous tissue, fascia and muscle of right gluteal area, 06/14/17 Dr. Gurney Maxin - culture: [facility] MORGANII - per ID will likely require at least 6 weeks of antibiotics with dialysis for the sacral osteomyelitis -Dakin's solution reordered today to help with pseudomonal coverage in wounds -unable to place VAC on these wounds.  Eventually the patient may require plastic surgery evaluation for consider of flaps.  Continue current treatment for now -DC hydrotherapy.  Wounds are overall quite clean  ID -fortaz 4/3>>,Vancomycin4/1>>4/4;Zosyn 4/1>>4/3;ancef perioperative3/22 FEN -renal diet VTE -SCDs, eliquis Foley -none  Plan: patient is stable for tx to [facility] if able and should continue to get wound care there. Will need follow up with the wound care clinic or plastics after discharge   LOS: 20 days    Henreitta Cea , [facility] Surgery 06/28/2017, 11:36 AM Pager: [phone]

## 2017-06-28 NOTE — Progress Notes (Signed)
Occupational Therapy Treatment Patient Details Name: John Bailey MRN: [id number] DOB: [birth year] Today's Date: 06/28/2017    History of present illness Pt is a 47 y.o. male with history of RA, PVD, HTN, ESRD on HD, A. fib, DM, CHF and sacral/coccyx pressure ulcer, who was admitted from 04/22/2017-05/13/2017 with anasarca from CHF and A. fib with RVR (cardioverted); was d/c to CIR for debility. He underwent debridement of sacral decubitus ulcer on 06/04/2017 by general surgery.  Pt was supposed to discharge from CIR on 06/09/2017 but general surgery recommended continued inpatient wound care, patient was readmitted on 06/09/2017.   OT comments  Pt seen for OT treatment.  Pt is significantly weaker than when this therapist last saw him on inpatient rehab.  Currently, pt is max assist +2 (pt 25%) for sit to stand from elevated surface and +2 as well for stand pivot transfers using the RW with platform.  He was unable to complete full sit to stand from the wheelchair after transfer and only completed partial standing, long enough for therapist to straighten out the pad in the chair.  Feel pt needs more extensive rehab at CIR level for optimum progress and return back to prior level, which was supervision.     Follow Up Recommendations  CIR;Supervision/Assistance - 24 hour    Equipment Recommendations  None recommended by OT    Recommendations for Other Services Rehab consult    Precautions / Restrictions Precautions Precautions: Fall Precaution Comments: Severe RA in shoulders, left hand Required Braces or Orthoses: Other Brace/Splint Other Brace/Splint: soft ankle splint for the left foot Restrictions Weight Bearing Restrictions: No       Mobility Bed Mobility                  Transfers Overall transfer level: Needs assistance Equipment used: Rolling walker (2 wheeled)(platform on the left side) Transfers: Sit to/from Stand Sit to Stand: +2 physical assistance;Max assist Stand  pivot transfers: +2 physical assistance;Max assist            Balance Overall balance assessment: Needs assistance Sitting-balance support: Feet supported Sitting balance-Leahy Scale: Fair     Standing balance support: Bilateral upper extremity supported Standing balance-Leahy Scale: Poor                             ADL either performed or assessed with clinical judgement   ADL Overall ADL's : Needs assistance/impaired             Lower Body Bathing: +2 for physical assistance;Maximal assistance Lower Body Bathing Details (indicate cue type and reason): sit to stand from EOB     Lower Body Dressing: +2 for physical assistance;Sit to/from stand;Maximal assistance Lower Body Dressing Details (indicate cue type and reason): sit to stand from EOB Toilet Transfer: +2 for physical assistance;Maximal assistance;RW Toilet Transfer Details (indicate cue type and reason): Pt only able to take 2 small steps with the RLE, unable to unweight the LLE for moving it during transfer.           Functional mobility during ADLs: +2 for physical assistance;Maximal assistance General ADL Comments: Pt able to complete sit to stand from the elevated EOB with total assist + 2 (pt 25%) and bed elevated.  On third attempt had pt work on stand pivot to the wheelchair on the right with use of the RW with platform on the left.  He was only able to take 2 small steps  with the RLE and unable to advance the LLE.  Sat down in the chair at forward angle with max assist for scooting back to safety.  Needed max assist for donning ankle brace on the LLE and for donning shoes with elastic laces this session.  F               Cognition Arousal/Alertness: Awake/alert Behavior During Therapy: WFL for tasks assessed/performed                                                       Pertinent Vitals/ Pain       Pain Assessment: Faces Faces Pain Scale: Hurts little more Pain  Location: sacral wounds Pain Descriptors / Indicators: Discomfort Pain Intervention(s): Monitored during session;Repositioned            Progress Toward Goals  OT Goals(current goals can now be found in the care plan section)  Progress towards OT goals: Progressing toward goals     Plan Discharge plan needs to be updated    Co-evaluation                 AM-PAC PT "6 Clicks" Daily Activity     Outcome Measure   Help from another person eating meals?: None Help from another person taking care of personal grooming?: A Little Help from another person toileting, which includes using toliet, bedpan, or urinal?: Total Help from another person bathing (including washing, rinsing, drying)?: Total Help from another person to put on and taking off regular upper body clothing?: A Little Help from another person to put on and taking off regular lower body clothing?: Total 6 Click Score: 13    End of Session Equipment Utilized During Treatment: Rolling walker  OT Visit Diagnosis: Muscle weakness (generalized) (M62.81);Other abnormalities of gait and mobility (R26.89);Other symptoms and signs involving cognitive function   Activity Tolerance Patient tolerated treatment well   Patient Left in bed;with call bell/phone within reach;with family/visitor present   Nurse Communication Mobility status;Need for lift equipment        Time: 5056-9794 OT Time Calculation (min): 36 min  Charges: OT General Charges $OT Visit: 1 Visit OT Treatments $Self Care/Home Management : 23-37 mins     Jezlyn Westerfield OTR/L 06/28/2017, 2:47 PM

## 2017-06-28 NOTE — Progress Notes (Signed)
Rehab Admissions Coordinator Note:  Patient was screened by Cleatrice Burke for appropriateness for an Inpatient Acute Rehab Consult per OT recommendation. Please refer to [facility]'s note with Dr. Serita Grit recommendations for SNF on 06/17/2017. Noted plans for possible LTACH.  Cleatrice Burke 06/28/2017, 3:07 PM  I can be reached at [phone].

## 2017-06-28 NOTE — Progress Notes (Addendum)
Hospitalist progress note   John Bailey  KDX:[id number] DOB: [birth year] DOA: 06/08/2017 PCP: [name], MD Specialists:  Infectious disease Renal  Brief Narrative:  46 year old rheumatoid arthritis, PVD, ESRD TTS, A. fib chads score >3 Eliquis, diabetes mellitus type 2, chronic diastolic heart failure, pressure ulcers Readmitted from CIR 06/09/2017 for debridement inpatient wound care, developed fever and seen by infectious disease  Assessment & Plan:  Right gluteal abscess Status post debridement 06/14/17 Wound culture = Morganella, continue ceftazadime for 6 weeks stop date 07/25/2017-foll c ID in 4 wk Febrile 06/19/17-diarrhea at the time not felt to be C. difficile --no further workup Some pain and discomfort still-pain management as per general surgery.  Not a candidate for wound VAC-eventual plastic surgery input for flaps-no need for hydrotherapy per general surgery  Acute blood loss anemia-excessive bleeding 4/6 surgeons packed wound and held Eliquis on at that time Hemoglobin 6.9 transfused 2 on 4/9 Hemoglobin stable 9.6And then went up to 10--currently 9.4 Hemoglobin 10 range white count dropping to 24  Unstageable sacral ulcer 3/22  4/15--he states he does not want to go to [facility] and it is unclear if he can transfer and sit at dialysis--he is chronically debilitated-please see OT notes from 4/15  ESRD on HD TTS HD as per [facility] cont fosrenal 250 tid,  Calcitriol 0.5 tts, aranesp1 150 q thurs   afib Chad>2/eliquis\-resumed Eliquis 4/13 Cont amio 200 mg, holding coreg   Chronic diastolic heart failure Managed at HD  Rheumatoid arthritis flare on abatacept Currently on prednisone taper 30 mg-we will cut back dose to 20 mg-not currently on steroids  White count slowly trending to 24 down from 30 Patient [place] rheumatology follow-up continue above med  Body mass index is 36.08 kg/m.    DVT prophylaxis: full  Code Status:   Ip   Family Communication:     none Disposition Plan: per gen surgery    Consultants:   Renal  gen surg  Procedures:   none  Antimicrobials:   ceftazidime   Subjective:  Awake pleasant oriented no distress Just got up to chair with therapy max assist Weak in the left leg No chest pain no fever no chills No nausea no vomiting   Objective: Vitals:   06/27/17 2130 06/28/17 0500 06/28/17 0507 06/28/17 1329  BP: 118/64  112/80 (!) 147/96  Pulse: 99  99 90  Resp: 17  16 18   Temp: 99.5 F (37.5 C)  99.5 F (37.5 C) 97.7 F (36.5 C)  TempSrc: Oral  Oral Oral  SpO2: 98%  96% 98%  Weight:  120.7 kg (266 lb)    Height:        Intake/Output Summary (Last 24 hours) at 06/28/2017 1642 Last data filed at 06/27/2017 1700 Gross per 24 hour  Intake 100 ml  Output -  Net 100 ml   Filed Weights   06/27/17 0112 06/28/17 0500  Weight: 120.7 kg (266 lb) 120.7 kg (266 lb)    Examination:  Pleasant alert oriented cushingoid habitus no pallor no icterus thick neck Abdomen soft nontender no rebound no guarding S1-S2 no murmur rub or gallop Wound not visualized today Lower extremities is soft without edema Flat affect  Data Reviewed: I have personally reviewed following labs and imaging studies  CBC: Recent Labs  Lab 06/22/17 0753 06/24/17 0647 06/25/17 0710 06/26/17 0956 06/28/17 0655  WBC 24.5* 30.5* 29.6* 27.0* 24.3*  NEUTROABS  --  28.4* 25.7*  --  23.3*  HGB 6.9* 9.6*  10.0* 9.4* 10.5*  HCT 20.9* 30.0* 30.9* 30.4* 33.0*  MCV 84.3 88.2 89.0 90.5 90.7  PLT 468* 414* 395 365 122   Basic Metabolic Panel: Recent Labs  Lab 06/22/17 0753 06/24/17 0647 06/25/17 0710 06/26/17 0955 06/28/17 0655  NA 134* 136 134* 132* 134*  K 3.1* 4.2 5.2* 4.9 4.3  CL 97* 99* 99* 97* 96*  CO2 24 23 23 25 24   GLUCOSE 142* 113* 94 56* 114*  BUN 52* 40* 26* 37* 25*  CREATININE 6.75* 5.44* 3.84* 4.99* 5.16*  CALCIUM 8.4* 8.8* 8.7* 8.6* 8.7*  PHOS 5.5* 4.8* 3.7 4.0 3.7   GFR: Estimated Creatinine Clearance:  24.5 mL/min (A) (by C-G formula based on SCr of 5.16 mg/dL (H)). Liver Function Tests: Recent Labs  Lab 06/22/17 0753 06/24/17 0647 06/25/17 0710 06/26/17 0955 06/28/17 0655  ALBUMIN 1.7* 2.0* 2.1* 2.0* 2.0*   No results for input(s): LIPASE, AMYLASE in the last 168 hours. No results for input(s): AMMONIA in the last 168 hours. Coagulation Profile: No results for input(s): INR, PROTIME in the last 168 hours. Cardiac Enzymes:  Radiology Studies: Reviewed images personally in health database   Scheduled Meds: . amiodarone  200 mg Oral Daily  . apixaban  5 mg Oral BID  . calcitRIOL  0.5 mcg Oral Q T,Th,Sat-1800  . darbepoetin (ARANESP) injection - DIALYSIS  150 mcg Intravenous Q Thu-HD  . feeding supplement (NEPRO CARB STEADY)  237 mL Oral BID BM  . insulin aspart  0-15 Units Subcutaneous TID WC  . insulin aspart  0-5 Units Subcutaneous QHS  . insulin aspart  5 Units Subcutaneous TID WC  . insulin glargine  25 Units Subcutaneous QHS  . lanthanum  250 mg Oral TID WC  . sodium chloride flush  3 mL Intravenous Q12H  . sodium hypochlorite   Topical Daily   Continuous Infusions: . sodium chloride    . cefTAZidime (FORTAZ)  IV Stopped (06/26/17 1243)     LOS: 20 days    Time spent: [place], MD [facility]  If 7PM-7AM, please contact night-coverage [email or web address] Password [facility] 06/28/2017, 4:41 PM

## 2017-06-28 NOTE — Consult Note (Signed)
[place] Nurse wound consult note Reason for Consult: Follow up for stage IV pressure injury to sacrum and an open surgical site to the right buttock/hip area Today I evaluated these wounds with PT.  See the PT notes for total wound measurements.  My concern today is that there are tunnels at the 4 o'clock area on both of these wounds.  The right buttock/hip tunnels for 7cm toward the anterior thigh.  The opening to this tunnel is too small to get a foam dressing in.  The sacral wound also tunnels at the 4 o'clock location and extends for 7.2 cm.  The opening to the tunnel is too small to get a foam dressing in.  I re-ordered Dakin's solution for the wounds to be applied on a daily basis.  I also spoke via phone with Clenton Pare, PA-C about the tunnels and future plans of care.  I suggested sending him to a facility for flap surgery. Kathlee Nations will discuss with her team for further planning. Val Riles, RN, MSN, CWOCN, CNS-BC, pager [phone]

## 2017-06-28 NOTE — Progress Notes (Signed)
Patient ID: John Bailey, male   DOB: 05-11-1970, 47 y.o.   MRN: [id number] [facility] Progress Note   Assessment/ Plan:   1. Sacral decubitus with osteomyelitis, gluteal abscess: Afebrile with persistent leukocytosis.  On ceftazidime with hemodialysis for osteomyelitis/sacral wound-Morganella (antibiotic stop date 07/25/17). 2. ESRD: Continue hemodialysis on a Tuesday/Thursday/Saturday schedule. He remains euvolemic and without any acute electrolyte abnormalities at this time after hemodialysis yesterday.  At this time, he is unable to transfer and therefore unable to participate in safe outpatient hemodialysis. Will work towards sitting in chair if it's possible. 3. Anemia: He has anemia of chronic kidney disease/acute illness that was compounded by acute blood loss anemia from bleeding from gluteal abscess incision and drainage site.  Improved with PRBC transfusion and currently stable. 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 5. Nutrition:Continue renal diet with ongoing protein supplementation for improvement of wound healing. 6. Vascular access- LAVF placed 05/10/17-maturation appears to be on the sluggish side, continue using TDC at this time. 7. Atrial fibrillation-rate controlled and on anticoagulation with Eliquis 8. Deconditioning: Case management working closely with patient to help decide on disposition.  Subjective:     For HD tomorrow.  Motivated to get better but frustrated with overall situation.   Objective:   BP (!) 147/96 (BP Location: Right Arm)   Pulse 90   Temp 97.7 F (36.5 C) (Oral)   Resp 18   Ht 6' (1.829 m)   Wt 120.7 kg (266 lb)   SpO2 98%   BMI 36.08 kg/m   Physical Exam: Gen: Comfortable resting in bed, engaged with me today CVS: Pulse regular rhythm, normal rate, normal S1 and S2 Resp: Diminished breath sounds with bases, no rales/rhonchi Abd: Soft, obese,  nontender Ext: No lower extremity edema, palpable thrill left RCF ACCESS: R IJ TDC in place  Labs: BMET Recent Labs  Lab 06/22/17 0753 06/24/17 0647 06/25/17 0710 06/26/17 0955 06/28/17 0655  NA 134* 136 134* 132* 134*  K 3.1* 4.2 5.2* 4.9 4.3  CL 97* 99* 99* 97* 96*  CO2 24 23 23 25 24   GLUCOSE 142* 113* 94 56* 114*  BUN 52* 40* 26* 37* 25*  CREATININE 6.75* 5.44* 3.84* 4.99* 5.16*  CALCIUM 8.4* 8.8* 8.7* 8.6* 8.7*  PHOS 5.5* 4.8* 3.7 4.0 3.7   CBC Recent Labs  Lab 06/24/17 0647 06/25/17 0710 06/26/17 0956 06/28/17 0655  WBC 30.5* 29.6* 27.0* 24.3*  NEUTROABS 28.4* 25.7*  --  23.3*  HGB 9.6* 10.0* 9.4* 10.5*  HCT 30.0* 30.9* 30.4* 33.0*  MCV 88.2 89.0 90.5 90.7  PLT 414* 395 365 297   Medications:    . amiodarone  200 mg Oral Daily  . apixaban  5 mg Oral BID  . calcitRIOL  0.5 mcg Oral Q T,Th,Sat-1800  . darbepoetin (ARANESP) injection - DIALYSIS  150 mcg Intravenous Q Thu-HD  . feeding supplement (NEPRO CARB STEADY)  237 mL Oral BID BM  . insulin aspart  0-15 Units Subcutaneous TID WC  . insulin aspart  0-5 Units Subcutaneous QHS  . insulin aspart  5 Units Subcutaneous TID WC  . insulin glargine  25 Units Subcutaneous QHS  . lanthanum  250 mg Oral TID WC  . sodium chloride flush  3 mL Intravenous Q12H  . sodium hypochlorite   Topical Daily   Madelon Lips, MD [phone] 06/28/2017, 2:17 PM

## 2017-06-28 NOTE — Progress Notes (Signed)
NCM spoke with inpt rehab admission coordinator [place]. Per Pamala Hurry  Dr.Patel's recommendation noted on 4/4/ 2019 is for SNF placement. Please refer to [facility]'s note with Dr. Serita Grit recommendations for SNF on 06/17/2017. Pt is no longer in need of LTAC, hydrotherapy d/c, NCM confirmed with Saverio Danker , surgical PA. NCM made CSW aware of need for  SNF disposition. Whitman Hero RN,BSN,CM

## 2017-06-28 NOTE — Progress Notes (Signed)
Physical Therapy Wound Treatment Patient Details  Name: SHONDALE QUINLEY MRN: [id number] Date of Birth: 08-29-1970  Today's Date: 06/28/2017 Time: 2094-7096 Time Calculation (min): 83 min  Subjective  Subjective: Are the wounds looking any better? Patient and Family Stated Goals: heal wounds  Prior Treatments: Surgical I&D 06/04/17 and 06/14/17  Pain Score:  Pt premedicated prior to treatment, 8/10 pain with hydrotherapy.   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 after surgery on 4/1 (Active)  Wound Image   06/23/2017 10:00 AM  Dressing Type ABD;Moist to dry;Other (Comment) 06/28/2017  1:00 PM  Dressing Changed 06/28/2017  1:00 PM  Dressing Change Frequency Twice a day 06/28/2017  1:00 PM  State of Healing Early/partial granulation 06/28/2017  1:00 PM  Site / Wound Assessment Granulation tissue;Painful;Pink;Yellow 06/28/2017  1:00 PM  % Wound base Red or Granulating 75% 06/28/2017  1:00 PM  % Wound base Yellow/Fibrinous Exudate 25% 06/28/2017  1:00 PM  Peri-wound Assessment Intact;Pink 06/28/2017  1:00 PM  Wound Length (cm) 8.5 cm 06/23/2017 10:00 AM  Wound Width (cm) 6.5 cm 06/23/2017 10:00 AM  Wound Depth (cm) 4 cm 06/23/2017 10:00 AM  Wound Surface Area (cm^2) 55.25 cm^2 06/23/2017 10:00 AM  Wound Volume (cm^3) 221 cm^3 06/23/2017 10:00 AM  Tunneling (cm) 6cm at 5 o'clock 06/23/2017 10:00 AM  Undermining (cm) 2 cm 12-4 o'clock 06/23/2017 10:00 AM  Margins Unattached edges (unapproximated) 06/28/2017  1:00 PM  Drainage Amount Moderate 06/28/2017  1:00 PM  Drainage Description Serosanguineous 06/28/2017  1:00 PM  Treatment Hydrotherapy (Pulse lavage);Debridement (Selective);Packing (Saline gauze) 06/28/2017  1:00 PM        Wound / Incision (Open or Dehisced) 06/17/17 Incision - Open Buttocks Right;Lateral R gluteal surgical debridement of abcess  (Active)  Wound Image   06/23/2017 10:00 AM  Dressing  Type ABD;Gauze (Comment);Moist to dry 06/28/2017  1:00 PM  Dressing Changed Changed 06/26/2017  8:30 PM  Dressing Status New drainage 06/28/2017  1:00 PM  Dressing Change Frequency Twice a day 06/28/2017  1:00 PM  Site / Wound Assessment Red;Granulation tissue 06/28/2017  1:00 PM  % Wound base Red or Granulating 95% 06/28/2017  1:00 PM  % Wound base Yellow/Fibrinous Exudate 5% 06/28/2017  1:00 PM  % Wound base Other/Granulation Tissue (Comment) 5% 06/24/2017  2:00 PM  Peri-wound Assessment Maceration;Pink 06/28/2017  1:00 PM  Wound Length (cm) 12.5 cm 06/23/2017 10:00 AM  Wound Width (cm) 9 cm 06/23/2017 10:00 AM  Wound Depth (cm) 4 cm 06/23/2017 10:00 AM  Wound Volume (cm^3) 450 cm^3 06/23/2017 10:00 AM  Wound Surface Area (cm^2) 112.5 cm^2 06/23/2017 10:00 AM  Tunneling (cm) 10 cm with drain at 5 o'clock 06/23/2017 10:00 AM  Margins Unattached edges (unapproximated) 06/28/2017  1:00 PM  Closure None 06/28/2017  1:00 PM  Drainage Amount Moderate 06/28/2017  1:00 PM  Drainage Description Serosanguineous 06/28/2017  1:00 PM  Treatment Packing (Saline gauze) 06/28/2017  1:00 PM      Hydrotherapy Pulsed lavage therapy - wound location: (sacrum) Pulsed Lavage with Suction (psi): 8 psi Pulsed Lavage with Suction - Normal Saline Used: 1000 mL Pulsed Lavage Tip: Tip with splash shield Selective Debridement Selective Debridement - Location: scarum Selective Debridement - Tools Used: Forceps;Scissors Selective Debridement - Tissue Removed: brown, gray yellow, necrotic tissue. (brown, gray, yellow necrosis.  )   Wound Assessment and Plan  Wound Therapy - Assess/Plan/Recommendations Wound Therapy - Clinical Statement:  [place] nurse and Surgical PA present during treatment for consultation. Placement of wound vac is not appropriate due to tunneling, team decided to discontinue hydrotherapy and to switch to dressings with Dakins soaked Kerlix after treatment today. Hydrotherapy signing off. Please reorder if needed.    Factors Delaying/Impairing Wound Healing: Immobility;Multiple medical problems;Diabetes Mellitus Hydrotherapy Plan: Pulsatile lavage with suction;Patient/family education;Dressing change Wound Therapy - Frequency: 6X / week Wound Therapy - Current Recommendations: Surgery consult;WOC nurse Wound Therapy - Follow Up Recommendations: Skilled nursing facility Wound Plan: see above  Wound Therapy Goals- Improve the function of patient's integumentary system by progressing the wound(s) through the phases of wound healing (inflammation - proliferation - remodeling) by: Decrease Necrotic Tissue to: 20 Decrease Necrotic Tissue - Progress: Progressing toward goal Increase Granulation Tissue to: 80 Increase Granulation Tissue - Progress: Progressing toward goal Goals/treatment plan/discharge plan were made with and agreed upon by patient/family: Yes Time For Goal Achievement: 7 days Wound Therapy - Potential for Goals: Good  Goals will be updated until maximal potential achieved or discharge criteria met.  Discharge criteria: when goals achieved, discharge from hospital, MD decision/surgical intervention, no progress towards goals, refusal/missing three consecutive treatments without notification or medical reason.  GP    Dani Gobble. [name] PT, DPT Acute Rehabilitation  [phone] Pager [phone]   [place] 06/28/2017, 1:54 PM

## 2017-06-28 NOTE — Progress Notes (Signed)
Inpatient Diabetes Program Recommendations  AACE/ADA: New Consensus Statement on Inpatient Glycemic Control (2015)  Target Ranges:  Prepandial:   less than 140 mg/dL      Peak postprandial:   less than 180 mg/dL (1-2 hours)      Critically ill patients:  140 - 180 mg/dL   Lab Results  Component Value Date   GLUCAP 122 (H) 06/28/2017   HGBA1C 6.9 (H) 06/08/2017    Review of Glycemic Control Results for John Bailey, John Bailey" (MRN [id number]) as of 06/28/2017 09:06  Ref. Range 06/27/2017 21:28 06/27/2017 21:57 06/27/2017 22:17 06/27/2017 22:59 06/28/2017 07:46  Glucose-Capillary Latest Ref Range: 65 - 99 mg/dL 44 (LL) 42 (LL) 54 (L) 138 (H) 122 (H)   Diabetes history: Type 2 DM Outpatient Diabetes medications:  Lantus 30 units q HS Current orders for Inpatient glycemic control:  Novolog moderate tid with meals and HS, Lantus 25 units q HS, Novolog 5 units tid with meals  Inpatient Diabetes Program Recommendations:   Noted multiple episodes of hypoglycemia. Patient no longer on steroids.  Consider reducing Lantus to 20 units daily and reduce Novolog meal coverage to 3 units tid with meals. Text page sent.  Thanks, Bronson Curb, MSN, RNC-OB Diabetes Coordinator [phone] (8a-5p)

## 2017-06-29 LAB — RENAL FUNCTION PANEL
ALBUMIN: 1.8 g/dL — AB (ref 3.5–5.0)
Anion gap: 14 (ref 5–15)
BUN: 33 mg/dL — ABNORMAL HIGH (ref 6–20)
CALCIUM: 8.5 mg/dL — AB (ref 8.9–10.3)
CO2: 22 mmol/L (ref 22–32)
CREATININE: 6.57 mg/dL — AB (ref 0.61–1.24)
Chloride: 97 mmol/L — ABNORMAL LOW (ref 101–111)
GFR, EST AFRICAN AMERICAN: 11 mL/min — AB (ref >60)
GFR, EST NON AFRICAN AMERICAN: 9 mL/min — AB (ref >60)
Glucose, Bld: 82 mg/dL (ref 65–99)
Phosphorus: 4.9 mg/dL — ABNORMAL HIGH (ref 2.5–4.6)
Potassium: 3.9 mmol/L (ref 3.5–5.1)
SODIUM: 133 mmol/L — AB (ref 135–145)

## 2017-06-29 LAB — CBC
HCT: 29.5 % — ABNORMAL LOW (ref 39.0–52.0)
Hemoglobin: 9.3 g/dL — ABNORMAL LOW (ref 13.0–17.0)
MCH: 28.2 pg (ref 26.0–34.0)
MCHC: 31.5 g/dL (ref 30.0–36.0)
MCV: 89.4 fL (ref 78.0–100.0)
PLATELETS: 289 10*3/uL (ref 150–400)
RBC: 3.3 MIL/uL — AB (ref 4.22–5.81)
RDW: 18.6 % — ABNORMAL HIGH (ref 11.5–15.5)
WBC: 19 10*3/uL — AB (ref 4.0–10.5)

## 2017-06-29 LAB — GLUCOSE, CAPILLARY
GLUCOSE-CAPILLARY: 81 mg/dL (ref 65–99)
GLUCOSE-CAPILLARY: 111 mg/dL — AB (ref 65–99)
Glucose-Capillary: 94 mg/dL (ref 65–99)
Glucose-Capillary: 121 mg/dL — ABNORMAL HIGH (ref 65–99)

## 2017-06-29 NOTE — Progress Notes (Signed)
Patient ID: John Bailey, male   DOB: September 10, 1970, 47 y.o.   MRN: [id number] [facility] Progress Note   Assessment/ Plan:   1. Sacral decubitus with osteomyelitis, gluteal abscess: Afebrile with persistent leukocytosis.  On ceftazidime for osteomyelitis/sacral wound-Morganella (antibiotic stop date 07/25/17).  OT rec from 4/15 notes CIR. 2. ESRD: Continue hemodialysis TTS.  Unclear if he can sit in a chair for HD at this time--> he is working on getting strength back. 3.  4. Anemia: He has anemia of chronic kidney disease/acute illness that was compounded by acute blood loss anemia from bleeding from gluteal abscess incision and drainage site.  Improved with PRBC transfusion and currently stable.  5. CKD-MBD:Fosrenol as binder, PTH 230 04/2017, will recheck next HD  6. Nutrition:renal diet, protein supps  7. Vascular access- LAVF placed 05/10/17-maturation appears to be on the sluggish side, continue using TDC at this time.  8. Atrial fibrillation-rate controlled and on anticoagulation with Eliquis  9. Deconditioning: Case management working closely with patient to help decide on disposition.  Subjective:    HD in bed today.     Objective:   BP 111/74 (BP Location: Right Arm)   Pulse (!) 104   Temp 97.8 F (36.6 C) (Oral)   Resp 18   Ht 6' (1.829 m)   Wt 120.7 kg (266 lb)   SpO2 98%   BMI 36.08 kg/m   Physical Exam: Gen: Comfortable resting in bed, e CVS: Pulse regular rhythm, normal rate, normal S1 and S2 Resp: Diminished breath sounds with bases, no rales/rhonchi Abd: Soft, obese, nontender Ext: No lower extremity edema ACCESS: R IJ TDC in place, L RC fistula + T/B  Labs: BMET Recent Labs  Lab 06/24/17 0647 06/25/17 0710 06/26/17 0955 06/28/17 0655 06/29/17 0717  NA 136 134* 132* 134* 133*  K 4.2 5.2* 4.9 4.3 3.9  CL 99* 99* 97* 96* 97*  CO2 23 23 25 24 22   GLUCOSE 113* 94 56* 114* 82  BUN 40* 26* 37* 25* 33*  CREATININE 5.44* 3.84* 4.99*  5.16* 6.57*  CALCIUM 8.8* 8.7* 8.6* 8.7* 8.5*  PHOS 4.8* 3.7 4.0 3.7 4.9*   CBC Recent Labs  Lab 06/24/17 0647 06/25/17 0710 06/26/17 0956 06/28/17 0655 06/29/17 0717  WBC 30.5* 29.6* 27.0* 24.3* 19.0*  NEUTROABS 28.4* 25.7*  --  23.3*  --   HGB 9.6* 10.0* 9.4* 10.5* 9.3*  HCT 30.0* 30.9* 30.4* 33.0* 29.5*  MCV 88.2 89.0 90.5 90.7 89.4  PLT 414* 395 365 297 289   Medications:    . amiodarone  200 mg Oral Daily  . apixaban  5 mg Oral BID  . calcitRIOL  0.5 mcg Oral Q T,Th,Sat-1800  . darbepoetin (ARANESP) injection - DIALYSIS  150 mcg Intravenous Q Thu-HD  . feeding supplement (NEPRO CARB STEADY)  237 mL Oral BID BM  . insulin aspart  0-15 Units Subcutaneous TID WC  . insulin aspart  0-5 Units Subcutaneous QHS  . insulin aspart  5 Units Subcutaneous TID WC  . insulin glargine  25 Units Subcutaneous QHS  . lanthanum  250 mg Oral TID WC  . sodium chloride flush  3 mL Intravenous Q12H  . sodium hypochlorite   Topical Daily   Madelon Lips, MD [phone] 06/29/2017, 2:26 PM

## 2017-06-29 NOTE — Progress Notes (Signed)
Hospitalist progress note   John Bailey  DXI:[id number] DOB: Dec 26, 1970 DOA: 06/08/2017 PCP: [name], MD Specialists:  Infectious disease Renal  Brief Narrative:  47 year old rheumatoid arthritis, PVD, ESRD TTS, A. fib chads score >3 Eliquis, diabetes mellitus type 2, chronic diastolic heart failure, pressure ulcers Readmitted from CIR 06/09/2017 for debridement inpatient wound care, developed fever and seen by infectious disease  Assessment & Plan:  Right gluteal abscess Status post debridement 06/14/17 Wound culture = Morganella, continue ceftazadime for 6 weeks stop date 07/25/2017-foll c ID in 4 wk Febrile 06/19/17-diarrhea at the time not felt to be C. difficile --no further workup Some pain and discomfort still-pain management as per general surgery.  Not a candidate for wound VAC-eventual plastic surgery input for flaps-no need for hydrotherapy per general surgery  Acute blood loss anemia-excessive bleeding 4/6 surgeons packed wound and held Eliquis on at that time Hemoglobin 6.9 transfused 2 on 4/9 Hemoglobin stable 9.6And then went up to 10--currently  9.3 Hemoglobin 10 range white count dropping to 19 from the high 30s initially   Unstageable sacral ulcer 3/22  4/15--he states he does not want to go to [facility] and it is unclear if he can transfer and sit at dialysis--he has a bed offer at [facility] but is concerned about going to a facility  ESRD on HD TTS HD as per [facility] cont fosrenal 250 tid,  Calcitriol 0.5 tts, aranesp1 150 q thurs   afib Chad>2/eliquis\-resumed Eliquis 4/13 Cont amio 200 mg, holding coreg   Chronic diastolic heart failure Managed at HD  Rheumatoid arthritis flare on abatacept Was on prednisone taper initially now off steroids  White count slowly trending to 19 down from 30--steroids were discontinued in an effort to help wound healing  Body mass index is 36.08 kg/m.    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 and does have good social support-I have seen multiple church members, and help out at the bedside We have encouraged him to think about options over the next 24-48 hours as he is stabilizing at this point and he will be discharged hopefully within 48 hours   Consultants:   Renal  gen surg  Procedures:   none  Antimicrobials:   ceftazidime   Subjective:  Awake alert and concerned about next steps-voices that he has to take care of his family and is not sure how he will do this    Objective: Vitals:   06/29/17 1130 06/29/17 1200 06/29/17 1214 06/29/17 1313  BP: 124/69 117/61 107/62 111/74  Pulse: 96 95 99 (!) 104  Resp: 18 18 18 18   Temp:   98.1 F (36.7 C) 97.8 F (36.6 C)  TempSrc:   Oral Oral  SpO2:   95% 98%  Weight:      Height:        Intake/Output Summary (Last 24 hours) at 06/29/2017 1715 Last data filed at 06/29/2017 1214 Gross per 24 hour  Intake 240 ml  Output 2000 ml  Net -1760 ml   Filed Weights   06/27/17 0112 06/28/17 0500  Weight: 120.7 kg (266 lb) 120.7 kg (266 lb)    Examination:  Pleasant alert oriented cushingoid  habitus no pallor no icterus thick neck Abdomen soft nontender no rebound no guarding S1-S2 no murmur rub or gallop Wound not visualized today Lower extremities is soft without edema Flat affect  Data Reviewed: I have personally reviewed following labs and imaging studies  CBC: Recent Labs  Lab 06/24/17 0647 06/25/17 0710 06/26/17 0956 06/28/17 0655 06/29/17 0717  WBC 30.5* 29.6* 27.0* 24.3* 19.0*  NEUTROABS 28.4* 25.7*  --  23.3*  --   HGB 9.6* 10.0* 9.4* 10.5* 9.3*  HCT 30.0* 30.9* 30.4* 33.0* 29.5*  MCV 88.2 89.0 90.5 90.7 89.4  PLT 414* 395 365 297 354    Basic Metabolic Panel: Recent Labs  Lab 06/24/17 0647 06/25/17 0710 06/26/17 0955 06/28/17 0655 06/29/17 0717  NA 136 134* 132* 134* 133*  K 4.2 5.2* 4.9 4.3 3.9  CL 99* 99* 97* 96* 97*  CO2 23 23 25 24 22   GLUCOSE 113* 94 56* 114* 82  BUN 40* 26* 37* 25* 33*  CREATININE 5.44* 3.84* 4.99* 5.16* 6.57*  CALCIUM 8.8* 8.7* 8.6* 8.7* 8.5*  PHOS 4.8* 3.7 4.0 3.7 4.9*   GFR: Estimated Creatinine Clearance: 19.2 mL/min (A) (by C-G formula based on SCr of 6.57 mg/dL (H)). Liver Function Tests: Recent Labs  Lab 06/24/17 0647 06/25/17 0710 06/26/17 0955 06/28/17 0655 06/29/17 0717  ALBUMIN 2.0* 2.1* 2.0* 2.0* 1.8*   No results for input(s): LIPASE, AMYLASE in the last 168 hours. No results for input(s): AMMONIA in the last 168 hours. Coagulation Profile: No results for input(s): INR, PROTIME in the last 168 hours. Cardiac Enzymes:  Radiology Studies: Reviewed images personally in health database   Scheduled Meds: . amiodarone  200 mg Oral Daily  . apixaban  5 mg Oral BID  . calcitRIOL  0.5 mcg Oral Q T,Th,Sat-1800  . darbepoetin (ARANESP) injection - DIALYSIS  150 mcg Intravenous Q Thu-HD  . feeding supplement (NEPRO CARB STEADY)  237 mL Oral BID BM  . insulin aspart  0-15 Units Subcutaneous TID WC  . insulin aspart  0-5 Units Subcutaneous QHS  . insulin aspart  5 Units Subcutaneous TID WC  . insulin glargine  25 Units Subcutaneous QHS  . lanthanum  250 mg Oral TID WC  . sodium chloride flush  3 mL Intravenous Q12H  . sodium hypochlorite   Topical Daily   Continuous Infusions: . sodium chloride    . cefTAZidime (FORTAZ)  IV Stopped (06/29/17 1214)     LOS: 21 days    Time spent: [place], MD [facility]  If 7PM-7AM, please contact night-coverage [email or web address] Password [email or web address] 06/29/2017, 5:15 PM

## 2017-06-29 NOTE — Progress Notes (Signed)
Patient requested to stay on air loss mattress bed. Patient spoke with the doctor about it.

## 2017-06-29 NOTE — Progress Notes (Signed)
Physical Therapy Treatment Patient Details Name: John Bailey MRN: [id number] DOB: August 23, 1970 Today's Date: 06/29/2017    History of Present Illness Pt is a 47 y.o. male with history of RA, PVD, HTN, ESRD on HD, A. fib, DM, CHF and sacral/coccyx pressure ulcer, who was admitted from 04/22/2017-05/13/2017 with anasarca from CHF and A. fib with RVR (cardioverted); was d/c to CIR for debility. He underwent debridement of sacral decubitus ulcer on 06/04/2017 by general surgery.  Pt was supposed to discharge from CIR on 06/09/2017 but general surgery recommended continued inpatient wound care, patient was readmitted on 06/09/2017.    PT Comments    Pt enthusiastic about working with PT, however is very limited in his mobility by increased fatigue s/p HD. Pt MaxAx2 for bed mobility. Pt attempted sit>stand to his platform walker however was not able to attain fully upright. Pt very disappointed and requested 2nd attempt. Due to pt's decreased strength and visible fatigue did not attempt and returned pt to his bed. PT recommendations remain appropriate at this time. PT will continue to follow acutely.   Follow Up Recommendations  SNF     Equipment Recommendations  Other (comment)    Recommendations for Other Services OT consult     Precautions / Restrictions Precautions Precautions: Fall Precaution Comments: Severe RA in shoulders, left hand Restrictions Weight Bearing Restrictions: No    Mobility  Bed Mobility Overal bed mobility: Needs Assistance Bed Mobility: Rolling;Sidelying to Sit Rolling: +2 for physical assistance;Max assist Sidelying to sit: HOB elevated;Max assist;+2 for physical assistance       General bed mobility comments: VC's needed for sequencing, pt able to move LE off of bed, requires assist for trunk to upright and pad scoot of hips to EoB  Transfers Overall transfer level: Needs assistance Equipment used: Rolling walker (2 wheeled)(platform on the left  side) Transfers: Sit to/from Stand Sit to Stand: (+3, 2 people lifting with a third to put seat down while pt held in standing) Stand pivot transfers: Max assist;+2 physical assistance       General transfer comment: pt unable to achieve fully upright, requires total assist to get hips back in bed to lay back down.       Balance Overall balance assessment: Needs assistance Sitting-balance support: Feet supported;Bilateral upper extremity supported Sitting balance-Leahy Scale: Fair     Standing balance support: Bilateral upper extremity supported Standing balance-Leahy Scale: Zero Standing balance comment: Unable to power up to standing.                             Cognition Arousal/Alertness: Awake/alert Behavior During Therapy: WFL for tasks assessed/performed Overall Cognitive Status: Impaired/Different from baseline Area of Impairment: Problem solving                   Current Attention Level: Selective Memory: Decreased short-term memory Following Commands: Follows multi-step commands with increased time Safety/Judgement: Decreased awareness of safety;Decreased awareness of deficits Awareness: Emergent Problem Solving: Slow processing General Comments: Pt with slower processing at times.              Pertinent Vitals/Pain Pain Assessment: Faces Faces Pain Scale: Hurts even more Pain Location: B knees (R>L), sacral wound with repositioning Pain Descriptors / Indicators: Discomfort;Grimacing Pain Intervention(s): Limited activity within patient's tolerance;Monitored during session;Repositioned           PT Goals (current goals can now be found in the care plan section) Acute Rehab  PT Goals PT Goal Formulation: With patient Time For Goal Achievement: 07/12/17 Potential to Achieve Goals: Fair Progress towards PT goals: PT to reassess next treatment    Frequency    Min 2X/week      PT Plan Current plan remains appropriate        AM-PAC PT "6 Clicks" Daily Activity  Outcome Measure  Difficulty turning over in bed (including adjusting bedclothes, sheets and blankets)?: Unable Difficulty moving from lying on back to sitting on the side of the bed? : Unable Difficulty sitting down on and standing up from a chair with arms (e.g., wheelchair, bedside commode, etc,.)?: Unable Help needed moving to and from a bed to chair (including a wheelchair)?: Total Help needed walking in hospital room?: Total Help needed climbing 3-5 steps with a railing? : Total 6 Click Score: 6    End of Session   Activity Tolerance: Patient limited by pain;Patient limited by fatigue Patient left: with bed alarm set;in bed;with family/visitor present Nurse Communication: Mobility status PT Visit Diagnosis: Other abnormalities of gait and mobility (R26.89);Muscle weakness (generalized) (M62.81) Pain - Right/Left: (bilateral ) Pain - part of body: Leg(sacral wound)     Time: 8088-1103 PT Time Calculation (min) (ACUTE ONLY): 37 min  Charges:  $Therapeutic Activity: 23-37 mins                    G Codes:       Taurus Willis B. [name] PT, DPT Acute Rehabilitation  [phone] Pager [phone]     [place] 06/29/2017, 5:11 PM

## 2017-06-30 DIAGNOSIS — N189 Chronic kidney disease, unspecified: Secondary | ICD-10-CM

## 2017-06-30 DIAGNOSIS — D631 Anemia in chronic kidney disease: Secondary | ICD-10-CM

## 2017-06-30 LAB — GLUCOSE, CAPILLARY
GLUCOSE-CAPILLARY: 57 mg/dL — AB (ref 65–99)
GLUCOSE-CAPILLARY: 95 mg/dL (ref 65–99)
GLUCOSE-CAPILLARY: 119 mg/dL — AB (ref 65–99)
Glucose-Capillary: 87 mg/dL (ref 65–99)
Glucose-Capillary: 110 mg/dL — ABNORMAL HIGH (ref 65–99)

## 2017-06-30 MED ORDER — INSULIN GLARGINE 100 UNIT/ML ~~LOC~~ SOLN
20.0000 [IU] | Freq: Every day | SUBCUTANEOUS | Status: DC
Start: 2017-06-30 — End: 2017-07-01
  Administered 2017-06-30: 20 [IU] via SUBCUTANEOUS
  Filled 2017-06-30 (×2): qty 0.2

## 2017-06-30 MED ORDER — GLUCERNA SHAKE PO LIQD
237.0000 mL | Freq: Three times a day (TID) | ORAL | Status: DC
  Administered 2017-06-30 – 2017-07-04 (×10): 237 mL via ORAL
  Filled 2017-06-30 (×7): qty 237

## 2017-06-30 NOTE — Progress Notes (Signed)
Nutrition Follow-up  DOCUMENTATION CODES:   Not applicable  INTERVENTION:  Glucerna Shake po TID, each supplement provides 220 kcal and 10 grams of protein  NUTRITION DIAGNOSIS:   Increased nutrient needs related to acute illness as evidenced by estimated needs. -ongoing  GOAL:   Patient will meet greater than or equal to 90% of their needs -unet  MONITOR:   PO intake, Labs, Weight trends, Supplement acceptance  REASON FOR ASSESSMENT:   Low Braden    ASSESSMENT:   John Bailey is a 47 y.o. male with a Past Medical History of RA, PVD, HTN, ESRD on TTS HD, afib on Eliquis, DM, and chronic diastolic CHF who was admitted from 2/7-28 with anasarca from acute on chronic diastolic heart failure and afib with RVR (cardioverted).  He was sent to rehab for debility secondary to cardiorenal syndrome and has remained there until today.  He was prepared to be discharged today but has an impressive sacral pressure ulcer that continues to require intensive inpatient wound care with hydrotherapy  Discussed patient with RN. He has been refusing multiple meals, only really drinking glucerna. Spoke with patient who states "I just have no appetite." Encouraged patient to eat to maintain weight and strength. 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 States "it isn't a depression thing, I just don't know, I'm not hungry." Continued to encouraged patient and re-iterated importance of PO intake. He asked for glucerna shake that was eat bedside, RD opened and provided to patient. Will continue to monitor.  He does state he would try to eat a swiss burger with rice and gravy. Ordered for patient for lunch tomorrow.  Labs reviewed:  Na 133, Phos 4.9,  Medications reviewed and include:  Insulin  Diet Order:  Fall precautions Diet renal/carb modified with fluid restriction Diet-HS Snack? Nothing; Fluid restriction: 1200 mL Fluid; Room service appropriate? Yes; Fluid consistency:  Thin  EDUCATION NEEDS:   Education needs have been addressed  Skin:  Skin Assessment: Skin Integrity Issues: Skin Integrity Issues:: Stage IV, DTI, Incisions DTI: heel Stage IV: sacrum Incisions: closed to sacrum, open to r buttocks  Last BM:  06/27/2017  Height:   Ht Readings from Last 1 Encounters:  06/12/17 6' (1.829 m)    Weight:   Wt Readings from Last 1 Encounters:  06/30/17 253 lb (114.8 kg)    Ideal Body Weight:  80.9 kg  BMI:  Body mass index is 34.31 kg/m.  Estimated Nutritional Needs:   Kcal:  2200-2400 calories  Protein:  120-150 grams  Fluid:  2.2-2.4L  Satira Anis. Nayab Aten, MS, RD LDN Inpatient Clinical Dietitian Pager [phone]

## 2017-06-30 NOTE — Progress Notes (Signed)
Clinical Social Worker following patient for support and discharge needs. CSW met patient at bedside to discuss rehab. CSW made patient aware that [facility] made a bed offer on patient. Patient stated he wanted to speak to wife about facility and have family look into rehab before making a decision. CSW will follow up with patient for decision     Rhea Pink, MSW,  [employer] [phone]

## 2017-06-30 NOTE — Progress Notes (Signed)
Hypoglycemic Event  CBG: 57  Treatment: 15 GM carbohydrate snack  Symptoms: None  Follow-up CBG: HQPR:[id number] CBG Result:95  Possible Reasons for Event: Unknown  Comments/MD notified: Patient received OJ and did not have any symptoms.    John Bailey

## 2017-06-30 NOTE — Progress Notes (Signed)
Student Nurse charted over my original charting.

## 2017-06-30 NOTE — Progress Notes (Signed)
Dressing change was done.

## 2017-06-30 NOTE — Clinical Social Work Note (Signed)
Patient's only bed offer is still [facility]. He is agreeable to this facility. Patient stated he has no concerns about sitting four hours for HD. He hopes to be able to transfer to chair and stand on scale for outpatient HD. CSW left message for [facility] admissions coordinator asking her to start insurance authorization.  Dayton Scrape, [place]

## 2017-06-30 NOTE — Progress Notes (Signed)
[place] for [facility] Indication: Osteo, wound infection   No Known Allergies  Patient Measurements: Height: 6' (182.9 cm) Weight: 253 lb (114.8 kg) IBW/kg (Calculated) : 77.6 Adjusted Body Weight:    Vital Signs: Temp: 99.1 F (37.3 C) (04/17 0558) Temp Source: Oral (04/17 0558) BP: 120/70 (04/17 0558) Pulse Rate: 93 (04/17 0558) Intake/Output from previous day: 04/16 0701 - 04/17 0700 In: 240 [P.O.:240] Out: 2000  Intake/Output from this shift: Total I/O In: 240 [P.O.:240] Out: -   Labs: Recent Labs    06/28/17 0655 06/29/17 0717  WBC 24.3* 19.0*  HGB 10.5* 9.3*  PLT 297 289  CREATININE 5.16* 6.57*   Estimated Creatinine Clearance: 18.8 mL/min (A) (by C-G formula based on SCr of 6.57 mg/dL (H)). No results for input(s): VANCOTROUGH, VANCOPEAK, VANCORANDOM, GENTTROUGH, GENTPEAK, GENTRANDOM, TOBRATROUGH, TOBRAPEAK, TOBRARND, AMIKACINPEAK, AMIKACINTROU, AMIKACIN in the last 72 hours.   Microbiology:   Medical History: Past Medical History:  Diagnosis Date  . Atrial flutter with rapid ventricular response ([place]) 08/06/2015   a. s/p DCCV in 07/2015 with recurrent atrial fibrillation and DCCV in 12/2016. Initially successful but noted to be back in atrial fibrillation within 2 weeks of DCCV.   [name] Chronic diastolic (congestive) heart failure (HCC)    a. EF 50-55% by echo in 06/2016.  . Diabetes ([place])   . Dysrhythmia    Aflutter  . ESRD (end stage renal disease) on dialysis ([place])    "TTS; at [facility]" (06/08/2017)  . Hypertension   . Peripheral vascular disease ([place])   . Rheumatoid arthritis ([place])    Assessment: ID: John Bailey [id number], osteo in sacral ulcer s/p debridement 3/22, repeat I&D gluteal abscess on 4/1. Per ID will need 6 weeks tx.  Surgery also concerned about PSA with green drainage. WBC=19 down,  Afeb.  John Bailey 4/3>>(5/13) Zosyn 4/1 >> 4/3 Vanc 4/1 >> 4/4  3/31 BCx: 1/2 MRSE (contamination) 4/1 abscess - morganella morganii  (sens to rocephin) 4/3 blood x2- ngtd 3/26 & 4/2 MRSA PCR neg  Goal of Therapy:  Eradication of infection  Plan:  - Fortaz 2gm TThS w/ HD . No dose changes   [name] S. Alford Highland, PharmD, BCPS Clinical Staff Pharmacist Pager [phone]  [name] 06/30/2017,1:39 PM

## 2017-06-30 NOTE — Progress Notes (Signed)
Student Nurse Sammie Bench charted over my original charting. -Dorise Hiss

## 2017-06-30 NOTE — Progress Notes (Signed)
Patient ID: John Bailey, male   DOB: [birth year], 47 y.o.   MRN: [id number] [facility] Progress Note   Assessment/ Plan:    1. Sacral decubitus with osteomyelitis, gluteal abscess: Afebrile with persistent leukocytosis.  On ceftazidime for osteomyelitis/sacral wound-Morganella (antibiotic stop date 07/25/17).  OT rec from 4/15 notes CIR.  2. ESRD: Continue hemodialysis TTS.  Unclear if he can sit in a chair for HD at this time--> we will attempt with HD tomorrow 4/18.  3. Anemia: He has anemia of chronic kidney disease/acute illness that was compounded by acute blood loss anemia from bleeding from gluteal abscess incision and drainage site.  Improved with PRBC transfusion and currently stable.  4. CKD-MBD:Fosrenol as binder, PTH 230 04/2017  5. Nutrition:renal diet, protein supps  6. Vascular access- LAVF placed 05/10/17-maturation appears to be on the sluggish side, continue using TDC at this time.  7. Atrial fibrillation-rate controlled and on anticoagulation with Eliquis  8. Deconditioning: Case management working closely with patient to help decide on disposition.  Subjective:    S/p HD yesterday.  Tolerated well.  Needs to get up into a chair for next HD. Pt reports he can sit for about 5 hours upright in wheelchair.  Willing to attempt tomorrow.      Objective:   BP 120/70 (BP Location: Right Arm)   Pulse 93   Temp 99.1 F (37.3 C) (Oral)   Resp 20   Ht 6' (1.829 m)   Wt 114.8 kg (253 lb)   SpO2 91%   BMI 34.31 kg/m   Physical Exam: Gen: Comfortable resting in bed, CVS: Pulse regular rhythm, normal rate, normal S1 and S2 Resp: Diminished breath sounds with bases, no rales/rhonchi Abd: Soft, obese, nontender Ext: No lower extremity edema MSK: sarcopenia ACCESS: R IJ TDC in place, L RC fistula + T/B  Labs: BMET Recent Labs  Lab 06/24/17 0647 06/25/17 0710 06/26/17 0955 06/28/17 0655 06/29/17 0717  NA 136 134* 132* 134* 133*  K 4.2 5.2* 4.9  4.3 3.9  CL 99* 99* 97* 96* 97*  CO2 23 23 25 24 22   GLUCOSE 113* 94 56* 114* 82  BUN 40* 26* 37* 25* 33*  CREATININE 5.44* 3.84* 4.99* 5.16* 6.57*  CALCIUM 8.8* 8.7* 8.6* 8.7* 8.5*  PHOS 4.8* 3.7 4.0 3.7 4.9*   CBC Recent Labs  Lab 06/24/17 0647 06/25/17 0710 06/26/17 0956 06/28/17 0655 06/29/17 0717  WBC 30.5* 29.6* 27.0* 24.3* 19.0*  NEUTROABS 28.4* 25.7*  --  23.3*  --   HGB 9.6* 10.0* 9.4* 10.5* 9.3*  HCT 30.0* 30.9* 30.4* 33.0* 29.5*  MCV 88.2 89.0 90.5 90.7 89.4  PLT 414* 395 365 297 289   Medications:    . amiodarone  200 mg Oral Daily  . apixaban  5 mg Oral BID  . calcitRIOL  0.5 mcg Oral Q T,Th,Sat-1800  . darbepoetin (ARANESP) injection - DIALYSIS  150 mcg Intravenous Q Thu-HD  . feeding supplement (NEPRO CARB STEADY)  237 mL Oral BID BM  . insulin aspart  0-15 Units Subcutaneous TID WC  . insulin aspart  0-5 Units Subcutaneous QHS  . insulin aspart  5 Units Subcutaneous TID WC  . insulin glargine  25 Units Subcutaneous QHS  . lanthanum  250 mg Oral TID WC  . sodium chloride flush  3 mL Intravenous Q12H   Madelon Lips, MD [phone] 06/30/2017, 12:42 PM

## 2017-06-30 NOTE — Progress Notes (Signed)
PROGRESS NOTE  John Bailey UUV:[id number] DOB: 11/17/1970 DOA: 06/08/2017 PCP: [name], MD  Brief Narrative: 47 year old man readmitted from [facility] 3/27 for fever, infected sacral wound.  Assessment/Plan Sacral decubitus with osteomyelitis, gluteal abscess.  Status post debridement by surgery 06/14/17.  Not a candidate for wound VAC per chart. --Continue ceftazidime for osteomyelitis, sacral wound, antibiotic stop date 07/25/17.   --Follow-up in infectious disease clinic early May.  ESRD --Not clear the patient can tolerate sitting in a chair at this time. --Continue management per nephrology  Anemia of chronic kidney disease, complicated by acute blood loss anemia from bleeding from gluteal abscess, acute illness.  Improved status post transfusion PRBC. --Follow clinically.  Atrial fibrillation. --Rate controlled.  Continue amiodarone.  Continue anticoagulation with apixaban.  Diabetes mellitus --One episode of hypoglycemia.  Follow clinically.  Continue Lantus, NovoLog.  Rheumatoid arthritis.  Steroid on hold to assist with wound healing.  Deconditioning. 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 Recommend contrast enhanced CT or MRI when patient's condition warrants to exclude pancreatic neoplasm and renal neoplasm --Follow-up as an outpatient.  Disposition unclear.  Patient refuses LTAC.  Uncertain about bed offer at skilled nursing facility.   Hopefully will increase his strength and be able to sit in chair and pursue rehab.  DVT prophylaxis: apixaban Code Status: Full Family Communication: none Disposition Plan: SNF?    Murray Hodgkins, MD  [facility] Direct contact: [phone] --Via amion app OR  --[email or web address]; password [email or web address]  7PM-7AM contact night coverage as above 06/30/2017, 12:22 PM  LOS: 22 days   Consultants:  General surgery  Infectious  disease  Nephrology  Procedures:    Antimicrobials:    Interval history/Subjective: Feels okay.  Eating okay.  Objective: Vitals:  Vitals:   06/29/17 2149 06/30/17 0558  BP: 123/71 120/70  Pulse: (!) 106 93  Resp: 19 20  Temp: 99 F (37.2 C) 99.1 F (37.3 C)  SpO2: 100% 91%    Exam:  Constitutional:  . Appears calm and comfortable Eyes:  . pupils and irises appear normal ENMT:  . grossly normal hearing  Respiratory:  . CTA bilaterally, no w/r/r.  . Respiratory effort normal Cardiovascular:  . RRR, no m/r/g . No LE extremity edema   Psychiatric:  . Mental status o Mood, affect appropriate  I have personally reviewed the following:   Labs:  One episode of hypoglycemia this morning, 57.  No other labs today.  BMP 4/16 reviewed.  Potassium within normal limits.  CBC 4/16 reviewed, WBC trending down, 19.0.  Hemoglobin stable 9.3.  Platelets within normal limits.  Scheduled Meds: . amiodarone  200 mg Oral Daily  . apixaban  5 mg Oral BID  . calcitRIOL  0.5 mcg Oral Q T,Th,Sat-1800  . darbepoetin (ARANESP) injection - DIALYSIS  150 mcg Intravenous Q Thu-HD  . feeding supplement (NEPRO CARB STEADY)  237 mL Oral BID BM  . insulin aspart  0-15 Units Subcutaneous TID WC  . insulin aspart  0-5 Units Subcutaneous QHS  . insulin aspart  5 Units Subcutaneous TID WC  . insulin glargine  25 Units Subcutaneous QHS  . lanthanum  250 mg Oral TID WC  . sodium chloride flush  3 mL Intravenous Q12H   Continuous Infusions: . sodium chloride    . cefTAZidime (FORTAZ)  IV Stopped (06/29/17 1214)    Principal Problem:   Sacral osteomyelitis ([place]) Active Problems:   Hypertensive heart disease with heart failure ([place])  LVH (left ventricular hypertrophy) due to hypertensive disease   Paroxysmal atrial fibrillation (H[facility])   Insulin-dependent diabetes mellitus with renal complications (H[facility])   Chronic diastolic CHF (congestive heart failure) (H[facility])   ESRD on dialysis  ([facility])   Neuropathic pain   Pressure injury of sacral region, unstageable ([place])   Sacral wound   Anemia due to chronic kidney disease   LOS: 22 days

## 2017-07-01 LAB — RENAL FUNCTION PANEL
ANION GAP: 12 (ref 5–15)
Albumin: 1.6 g/dL — ABNORMAL LOW (ref 3.5–5.0)
BUN: 25 mg/dL — AB (ref 6–20)
CO2: 24 mmol/L (ref 22–32)
Calcium: 8.3 mg/dL — ABNORMAL LOW (ref 8.9–10.3)
Chloride: 94 mmol/L — ABNORMAL LOW (ref 101–111)
Creatinine, Ser: 5.89 mg/dL — ABNORMAL HIGH (ref 0.61–1.24)
GFR calc Af Amer: 12 mL/min — ABNORMAL LOW (ref >60)
GFR calc non Af Amer: 11 mL/min — ABNORMAL LOW (ref >60)
GLUCOSE: 103 mg/dL — AB (ref 65–99)
POTASSIUM: 4.2 mmol/L (ref 3.5–5.1)
Phosphorus: 3.7 mg/dL (ref 2.5–4.6)
Sodium: 130 mmol/L — ABNORMAL LOW (ref 135–145)

## 2017-07-01 LAB — CBC
HEMATOCRIT: 26.3 % — AB (ref 39.0–52.0)
HEMOGLOBIN: 8.1 g/dL — AB (ref 13.0–17.0)
MCH: 27.6 pg (ref 26.0–34.0)
MCHC: 30.8 g/dL (ref 30.0–36.0)
MCV: 89.5 fL (ref 78.0–100.0)
Platelets: 283 10*3/uL (ref 150–400)
RBC: 2.94 MIL/uL — ABNORMAL LOW (ref 4.22–5.81)
RDW: 18.2 % — AB (ref 11.5–15.5)
WBC: 22.3 10*3/uL — ABNORMAL HIGH (ref 4.0–10.5)

## 2017-07-01 LAB — GLUCOSE, CAPILLARY
GLUCOSE-CAPILLARY: 131 mg/dL — AB (ref 65–99)
Glucose-Capillary: 62 mg/dL — ABNORMAL LOW (ref 65–99)
Glucose-Capillary: 74 mg/dL (ref 65–99)
Glucose-Capillary: 170 mg/dL — ABNORMAL HIGH (ref 65–99)

## 2017-07-01 MED ORDER — INSULIN GLARGINE 100 UNIT/ML ~~LOC~~ SOLN
15.0000 [IU] | Freq: Every day | SUBCUTANEOUS | Status: DC
  Administered 2017-07-01 – 2017-07-03 (×3): 15 [IU] via SUBCUTANEOUS
  Filled 2017-07-01 (×4): qty 0.15

## 2017-07-01 MED ORDER — CALCITRIOL 0.5 MCG PO CAPS
ORAL_CAPSULE | ORAL | Status: AC
  Administered 2017-07-01: 0.5 ug
  Filled 2017-07-01: qty 1

## 2017-07-01 MED ORDER — PREDNISONE 10 MG PO TABS
10.0000 mg | ORAL_TABLET | Freq: Every day | ORAL | Status: DC
  Administered 2017-07-02 – 2017-07-04 (×2): 10 mg via ORAL
  Filled 2017-07-01 (×3): qty 1

## 2017-07-01 MED ORDER — DARBEPOETIN ALFA 150 MCG/0.3ML IJ SOSY
PREFILLED_SYRINGE | INTRAMUSCULAR | Status: AC
  Filled 2017-07-01: qty 0.3

## 2017-07-01 MED ORDER — COLLAGENASE 250 UNIT/GM EX OINT
TOPICAL_OINTMENT | Freq: Every day | CUTANEOUS | Status: DC
  Administered 2017-07-02 – 2017-07-04 (×3): via TOPICAL
  Filled 2017-07-01: qty 30

## 2017-07-01 MED ORDER — COLLAGENASE 250 UNIT/GM EX OINT
TOPICAL_OINTMENT | Freq: Every day | CUTANEOUS | Status: DC
  Filled 2017-07-01: qty 30

## 2017-07-01 NOTE — Progress Notes (Signed)
Patient ID: John Bailey, male   DOB: [birth year], 47 y.o.   MRN: [id number] [facility] Progress Note   Assessment/ Plan:    1. Sacral decubitus with osteomyelitis, gluteal abscess: Afebrile with persistent leukocytosis.  On ceftazidime for osteomyelitis/sacral wound-Morganella (antibiotic stop date 07/25/17).  OT rec from 4/15 notes CIR.  2. ESRD: Continue hemodialysis TTS.  Couldn't transfer to sit in chair this AM.   3. Anemia: He has anemia of chronic kidney disease/acute illness that was compounded by acute blood loss anemia from bleeding from gluteal abscess incision and drainage site.  Improved with PRBC transfusion and currently stable.  4. CKD-MBD:Fosrenol as binder, PTH 230 04/2017  5. Nutrition:renal diet, protein supps  6. Vascular access- LAVF placed 05/10/17-maturation appears to be on the sluggish side, continue using TDC at this time.  7. Atrial fibrillation-rate controlled and on anticoagulation with Eliquis  8. Deconditioning: Case management working closely with patient to help decide on disposition.  9. RA: off pred for wound healing, MTX contraindicated in ESRD, on antibiotics so biologics are high risk at this time.  Maybe low-dose pred, around 10 mg?     Subjective:    Unable to transfer into the chair for HD today.  HD in bed.  Thinks he's having an RA flare   Objective:   BP (!) 95/51   Pulse 97   Temp 98 F (36.7 C) (Oral)   Resp 18   Ht 6' (1.829 m)   Wt 112 kg (246 lb 14.6 oz)   SpO2 100%   BMI 33.49 kg/m   Physical Exam: Gen: Comfortable resting in bed, CVS: Pulse regular rhythm, normal rate, normal S1 and S2 Resp: Diminished breath sounds with bases, no rales/rhonchi Abd: Soft, obese, nontender Ext: No lower extremity edema MSK: sarcopenia, some hyperpigmentation and mild warmth of MCPs and PIPs bilaterally ACCESS: R IJ TDC in place, L RC fistula + T/B  Labs: BMET Recent Labs  Lab 06/25/17 0710 06/26/17 0955  06/28/17 0655 06/29/17 0717 07/01/17 0841  NA 134* 132* 134* 133* 130*  K 5.2* 4.9 4.3 3.9 4.2  CL 99* 97* 96* 97* 94*  CO2 23 25 24 22 24   GLUCOSE 94 56* 114* 82 103*  BUN 26* 37* 25* 33* 25*  CREATININE 3.84* 4.99* 5.16* 6.57* 5.89*  CALCIUM 8.7* 8.6* 8.7* 8.5* 8.3*  PHOS 3.7 4.0 3.7 4.9* 3.7   CBC Recent Labs  Lab 06/25/17 0710 06/26/17 0956 06/28/17 0655 06/29/17 0717 07/01/17 0841  WBC 29.6* 27.0* 24.3* 19.0* 22.3*  NEUTROABS 25.7*  --  23.3*  --   --   HGB 10.0* 9.4* 10.5* 9.3* 8.1*  HCT 30.9* 30.4* 33.0* 29.5* 26.3*  MCV 89.0 90.5 90.7 89.4 89.5  PLT 395 365 297 289 283   Medications:    . amiodarone  200 mg Oral Daily  . apixaban  5 mg Oral BID  . calcitRIOL  0.5 mcg Oral Q T,Th,Sat-1800  . darbepoetin (ARANESP) injection - DIALYSIS  150 mcg Intravenous Q Thu-HD  . feeding supplement (GLUCERNA SHAKE)  237 mL Oral TID BM  . insulin aspart  0-15 Units Subcutaneous TID WC  . insulin aspart  0-5 Units Subcutaneous QHS  . insulin aspart  5 Units Subcutaneous TID WC  . insulin glargine  20 Units Subcutaneous QHS  . lanthanum  250 mg Oral TID WC  . sodium chloride flush  3 mL Intravenous Q12H   Madelon Lips, MD [phone] 07/01/2017, 9:26 AM

## 2017-07-01 NOTE — Progress Notes (Signed)
Patient ID: John Bailey, male   DOB: 07/03/1970, 47 y.o.   MRN: [id number]    17 Days Post-Op  Subjective: Patient just got back from HD  Objective: Vital signs in last 24 hours: Temp:  [98 F (36.7 C)-98.7 F (37.1 C)] 98 F (36.7 C) (04/18 0816) Pulse Rate:  [88-115] 100 (04/18 1200) Resp:  [18-30] 18 (04/18 0816) BP: (80-143)/(41-95) 80/48 (04/18 1200) SpO2:  [95 %-100 %] 100 % (04/18 0806) Weight:  [112 kg (246 lb 14.6 oz)-112 kg (247 lb)] 112 kg (246 lb 14.6 oz) (04/18 0806) Last BM Date: 06/30/17  Intake/Output from previous day: 04/17 0701 - 04/18 0700 In: 600 [P.O.:600] Out: 450 [Urine:450] Intake/Output this shift: Total I/O In: 220 [P.O.:120; IV Piggyback:100] Out: -   PE: Skin: Wounds are overall clean on sacrum and right gluteus.  Fibrin at base of sacral wound as was noted on last hydrotherapy treatment.  There is a small area of skin injury right lateral to the sacral wound.  This is new since Monday.  Lab Results:  Recent Labs    06/29/17 0717 07/01/17 0841  WBC 19.0* 22.3*  HGB 9.3* 8.1*  HCT 29.5* 26.3*  PLT 289 283   BMET Recent Labs    06/29/17 0717 07/01/17 0841  NA 133* 130*  K 3.9 4.2  CL 97* 94*  CO2 22 24  GLUCOSE 82 103*  BUN 33* 25*  CREATININE 6.57* 5.89*  CALCIUM 8.5* 8.3*   PT/INR No results for input(s): LABPROT, INR in the last 72 hours. CMP     Component Value Date/Time   NA 130 (L) 07/01/2017 0841   K 4.2 07/01/2017 0841   CL 94 (L) 07/01/2017 0841   CO2 24 07/01/2017 0841   GLUCOSE 103 (H) 07/01/2017 0841   BUN 25 (H) 07/01/2017 0841   CREATININE 5.89 (H) 07/01/2017 0841   CALCIUM 8.3 (L) 07/01/2017 0841   PROT 5.7 (L) 06/15/2017 0240   ALBUMIN 1.6 (L) 07/01/2017 0841   AST 23 06/15/2017 0240   ALT 16 (L) 06/15/2017 0240   ALKPHOS 96 06/15/2017 0240   BILITOT 0.8 06/15/2017 0240   GFRNONAA 11 (L) 07/01/2017 0841   GFRAA 12 (L) 07/01/2017 0841   Lipase  No results found for:  LIPASE     Studies/Results: No results found.  Anti-infectives: Anti-infectives (From admission, onward)   Start     Dose/Rate Route Frequency Ordered Stop   06/17/17 1200  cefTAZidime (FORTAZ) 2 g in sodium chloride 0.9 % 100 mL IVPB     2 g 200 mL/hr over 30 Minutes Intravenous Every T-Th-Sa (Hemodialysis) 06/16/17 1001 07/25/17 2359   06/17/17 0837  vancomycin (VANCOCIN) 1-5 GM/200ML-% IVPB    Note to Pharmacy:  Louretta Shorten   : cabinet override      06/17/17 0837 06/17/17 1123   06/16/17 1100  cefTAZidime (FORTAZ) 2 g in sodium chloride 0.9 % 100 mL IVPB     2 g 200 mL/hr over 30 Minutes Intravenous  Once 06/16/17 1001 06/16/17 1137   06/15/17 1502  vancomycin (VANCOCIN) 1-5 GM/200ML-% IVPB    Note to Pharmacy:  Herriott, Melisa   : cabinet override      06/15/17 1502 06/15/17 1553   06/15/17 1200  vancomycin (VANCOCIN) IVPB 1000 mg/200 mL premix  Status:  Discontinued     1,000 mg 200 mL/hr over 60 Minutes Intravenous Every T-Th-Sa (Hemodialysis) 06/14/17 1256 06/17/17 1153   06/14/17 1330  vancomycin (VANCOCIN) 2,000 mg in sodium  chloride 0.9 % 500 mL IVPB     2,000 mg 250 mL/hr over 120 Minutes Intravenous  Once 06/14/17 1256 06/14/17 2229   06/14/17 1130  piperacillin-tazobactam (ZOSYN) IVPB 3.375 g  Status:  Discontinued     3.375 g 12.5 mL/hr over 240 Minutes Intravenous Every 12 hours 06/14/17 1057 06/16/17 0936       Assessment/Plan AF/DVT - on Eliquis DCHF/NICM ESRD HD - TTS Cardiorenal syndrome Diabetes ID - peripheral neuropathy Hypertension RA - on steroids Deconditioning- cardiorenal syndrome Anemia- received 1 uPRBC 4/5 and 1 unit 4/6, holding eliquis, hg now stable at 8.1   Sacral ulcer S/pDEBRIDMENT OF SACRAL DECUBITUS ULCER [id number] Dr. Melanee Spry -WD dressing changes. Would like for fibrin tissue at base to improve prior to [facility] placement -new tissue injury to right lateral sacral wound.  Will put santyl on it and continue  dressing changes.  If this worsens, he may need hydrotherapy again, will follow Right gluteal abscess S/pDebridement of 70cm^2 tissue involving subcutaneous tissue, fascia and muscle of right gluteal area, 06/14/17 Dr. Gurney Maxin - culture: [facility] MORGANII - per ID will likely require at least 6 weeks of antibiotics with dialysis for the sacral osteomyelitis -unable to place VAC on these wounds.  Eventually the patient may require plastic surgery evaluation for consider of flaps.  Continue current treatment for now  ID -fortaz 4/3>>,Vancomycin4/1>>4/4;Zosyn 4/1>>4/3;ancef perioperative3/22 FEN -renal diet VTE -SCDs,eliquis Foley -none  Plan: santyl to new tissue injury at sacral wound site.  Cont NS WD dressing changes BID     LOS: 23 days    Henreitta Cea , [facility] 07/01/2017, 1:48 PM Pager: [phone]

## 2017-07-01 NOTE — Progress Notes (Signed)
Occupational Therapy Treatment Patient Details Name: John Bailey MRN: [id number] DOB: Apr 01, 1970 Today's Date: 07/01/2017    History of present illness Pt is a 47 y.o. male with history of RA, PVD, HTN, ESRD on HD, A. fib, DM, CHF and sacral/coccyx pressure ulcer, who was admitted from 04/22/2017-05/13/2017 with anasarca from CHF and A. fib with RVR (cardioverted); was d/c to CIR for debility. He underwent debridement of sacral decubitus ulcer on 06/04/2017 by general surgery.  Pt was supposed to discharge from CIR on 06/09/2017 but general surgery recommended continued inpatient wound care, patient was readmitted on 06/09/2017.   OT comments  Goal of session to transfer pt to dialysis recliner to build toleration of sitting in preparation for discharge. Pt stood x 2 with sara plus. Maintained standing for dressing change with encouragement. Roho seat cushion place in recliner with pillow under R hip with pt semi reclined. Will continue to follow.  Follow Up Recommendations  SNF    Equipment Recommendations       Recommendations for Other Services      Precautions / Restrictions Precautions Precautions: Fall Precaution Comments: Severe RA in shoulders, left hand Restrictions Weight Bearing Restrictions: No       Mobility Bed Mobility               General bed mobility comments: pt seated EOB upon arrival, reports getting to EOB independently  Transfers Overall transfer level: Needs assistance   Transfers: Sit to/from Stand Sit to Stand: Total assist Stand pivot transfers: Total assist       General transfer comment: pt participated in standing x 2 with sara plus from bed, transferred to dialysis chair    Balance Overall balance assessment: Needs assistance   Sitting balance-Leahy Scale: Fair     Standing balance support: Bilateral upper extremity supported Standing balance-Leahy Scale: Zero Standing balance comment: stood with support of sara plus during sacral  dressing change                           ADL either performed or assessed with clinical judgement   ADL                           Toilet Transfer: Total assistance   Toileting- Clothing Manipulation and Hygiene: Total assistance;Sit to/from stand               Vision       Perception     Praxis      Cognition Arousal/Alertness: Awake/alert Behavior During Therapy: WFL for tasks assessed/performed Overall Cognitive Status: Impaired/Different from baseline Area of Impairment: Following commands;Problem solving                       Following Commands: Follows multi-step commands with increased time     Problem Solving: Slow processing General Comments: Pt with slower processing at times.         Exercises     Shoulder Instructions       General Comments      Pertinent Vitals/ Pain       Pain Assessment: Faces Faces Pain Scale: Hurts even more Pain Location: buttocks, generalized Pain Descriptors / Indicators: Discomfort;Grimacing Pain Intervention(s): Monitored during session;Repositioned  Home Living  Prior Functioning/Environment              Frequency  Min 2X/week        Progress Toward Goals  OT Goals(current goals can now be found in the care plan section)  Progress towards OT goals: Progressing toward goals  Acute Rehab OT Goals Patient Stated Goal: Feel better OT Goal Formulation: With patient Time For Goal Achievement: 07/07/17 Potential to Achieve Goals: Good  Plan Discharge plan needs to be updated    Co-evaluation    PT/OT/SLP Co-Evaluation/Treatment: Yes Reason for Co-Treatment: For patient/therapist safety;To address functional/ADL transfers   OT goals addressed during session: Strengthening/ROM      AM-PAC PT "6 Clicks" Daily Activity     Outcome Measure   Help from another person eating meals?: A Little Help from  another person taking care of personal grooming?: A Little Help from another person toileting, which includes using toliet, bedpan, or urinal?: Total Help from another person bathing (including washing, rinsing, drying)?: A Lot Help from another person to put on and taking off regular upper body clothing?: A Lot Help from another person to put on and taking off regular lower body clothing?: Total 6 Click Score: 12    End of Session Equipment Utilized During Treatment: Gait belt  OT Visit Diagnosis: Muscle weakness (generalized) (M62.81);Other abnormalities of gait and mobility (R26.89);Other symptoms and signs involving cognitive function   Activity Tolerance Patient tolerated treatment well   Patient Left in chair;with call bell/phone within reach   Nurse Communication Mobility status;Need for lift equipment        Time: 5830-9407 OT Time Calculation (min): 49 min  Charges: OT General Charges $OT Visit: 1 Visit OT Treatments $Therapeutic Activity: 8-22 mins  07/01/2017 Nestor Lewandowsky, OTR/L Pager: [phone]   Werner Lean Haze Boyden 07/01/2017, 4:01 PM

## 2017-07-01 NOTE — Procedures (Signed)
Patient seen and examined on Hemodialysis. QB 400 mL/ min via TDC UF goal 2.5L  Treatment adjusted as needed.  Madelon Lips MD [facility] pgr [phone] 9:29 AM

## 2017-07-01 NOTE — Progress Notes (Signed)
Physical Therapy Treatment Patient Details Name: John Bailey MRN: [id number] DOB: 28-Jun-1970 Today's Date: 07/01/2017    History of Present Illness Pt is a 47 y.o. male with history of RA, PVD, HTN, ESRD on HD, A. fib, DM, CHF and sacral/coccyx pressure ulcer, who was admitted from 04/22/2017-05/13/2017 with anasarca from CHF and A. fib with RVR (cardioverted); was d/c to CIR for debility. He underwent debridement of sacral decubitus ulcer on 06/04/2017 by general surgery.  Pt was supposed to discharge from CIR on 06/09/2017 but general surgery recommended continued inpatient wound care, patient was readmitted on 06/09/2017.    PT Comments    PT requested to help move pt to hemodialysis chair to work on sitting tolerance for hemodialysis treatment at d/c. Roho cushion placed in recliner and pillow placed under L buttock to decrease pressure on wounds. Pt requires totalA with [name] to stand 2x for dressing change and for transfer to recliner. Nursing notified of how to return pt to bed. PT will continue to follow until d/c.     Follow Up Recommendations  SNF     Equipment Recommendations  Other (comment)(TBD)    Recommendations for Other Services       Precautions / Restrictions Precautions Precautions: Fall Precaution Comments: Severe RA in shoulders, left hand Restrictions Weight Bearing Restrictions: No    Mobility  Bed Mobility               General bed mobility comments: pt seated EOB upon arrival, reports getting to EOB independently  Transfers Overall transfer level: Needs assistance   Transfers: Sit to/from Stand Sit to Stand: Total assist Stand pivot transfers: Total assist       General transfer comment: pt participated in standing x 2 with sara plus from bed, transferred to dialysis chair     Balance Overall balance assessment: Needs assistance   Sitting balance-Leahy Scale: Fair     Standing balance support: Bilateral upper extremity  supported Standing balance-Leahy Scale: Zero Standing balance comment: stood with support of sara plus during sacral dressing change                            Cognition Arousal/Alertness: Awake/alert Behavior During Therapy: WFL for tasks assessed/performed Overall Cognitive Status: Impaired/Different from baseline Area of Impairment: Following commands;Problem solving                       Following Commands: Follows multi-step commands with increased time     Problem Solving: Slow processing General Comments: Pt with slower processing at times.          General Comments General comments (skin integrity, edema, etc.): Pt able to tolerate standing for 2+ minutes for dressing change of sacral and gluteal wounds. Pt reports that RA in shoulders and knees is flaring today      Pertinent Vitals/Pain Pain Assessment: Faces Faces Pain Scale: Hurts even more Pain Location: buttocks, generalized Pain Descriptors / Indicators: Discomfort;Grimacing Pain Intervention(s): Monitored during session;Limited activity within patient's tolerance;Repositioned           PT Goals (current goals can now be found in the care plan section) Acute Rehab PT Goals Patient Stated Goal: Feel better PT Goal Formulation: With patient Time For Goal Achievement: 07/12/17 Potential to Achieve Goals: Fair Progress towards PT goals: Progressing toward goals    Frequency    Min 2X/week      PT Plan Current plan  remains appropriate    Co-evaluation PT/OT/SLP Co-Evaluation/Treatment: Yes Reason for Co-Treatment: For patient/therapist safety PT goals addressed during session: Mobility/safety with mobility OT goals addressed during session: Strengthening/ROM      AM-PAC PT "6 Clicks" Daily Activity  Outcome Measure  Difficulty turning over in bed (including adjusting bedclothes, sheets and blankets)?: Unable Difficulty moving from lying on back to sitting on the side of the  bed? : Unable Difficulty sitting down on and standing up from a chair with arms (e.g., wheelchair, bedside commode, etc,.)?: Unable Help needed moving to and from a bed to chair (including a wheelchair)?: Total Help needed walking in hospital room?: Total Help needed climbing 3-5 steps with a railing? : Total 6 Click Score: 6    End of Session Equipment Utilized During Treatment: Gait belt Activity Tolerance: Patient limited by pain;Patient limited by lethargy;Patient limited by fatigue Patient left: in chair;with call bell/phone within reach Nurse Communication: Mobility status PT Visit Diagnosis: Other abnormalities of gait and mobility (R26.89);Muscle weakness (generalized) (M62.81) Pain - part of body: (sacral and L gluteal wound)     Time: 3582-5189 PT Time Calculation (min) (ACUTE ONLY): 49 min  Charges:  $Therapeutic Activity: 23-37 mins                    G Codes:       Manan Olmo B. [name] PT, DPT Acute Rehabilitation  [phone] Pager [phone]     [place] 07/01/2017, 4:43 PM

## 2017-07-01 NOTE — Progress Notes (Signed)
OT Cancellation Note  Patient Details Name: KATLIN CISZEWSKI MRN: [id number] DOB: 1970-05-09   Cancelled Treatment:    Reason Eval/Treat Not Completed: Patient at procedure or test/ unavailable(HD. Will follow.)  Malka So 07/01/2017, 8:39 AM  07/01/2017 Nestor Lewandowsky, OTR/L Pager: [phone]

## 2017-07-01 NOTE — Progress Notes (Signed)
PROGRESS NOTE  John Bailey VZC:[id number] DOB: 1970-12-23 DOA: 06/08/2017 PCP: [name], MD  Brief Narrative: 47 year old man with a 3-week hospitalization in February (2/7-2/28) with subsequent discharge to CIR.  At that time he was treated for acute on chronic diastolic heart failure but failed to improve her diuretics and therefore with diuretics.  Hemodialysis was initiated on admission.  He was then hospitalized at [facility] for nearly 1 month (2/28-3/26) for debility secondary to cardiorenal syndrome.  During that hospitalization he was seen by nephrology, cardiology (atrial fibrillation CHF), as well as by general surgery for debridement of sacral decubitus 3/22.  CIR discharged the patient 3/26, however per surgery the patient was not a candidate for discharge home and therefore the patient was readmitted to the hospital for management of his wounds until such time general surgery felt the patient was stable for discharge.  He was followed by general surgery nephrology and underwent wound care.  He subsequently developed fever, hypotension, altered mental status and CT revealed gluteal abscess and he was transferred to the stepdown unit.  He was treated with antibiotics for sepsis and underwent debridement of gluteal abscess.  He had episodes of bleeding from his gluteal wounds requiring transfusion.  Wound has been very slow to heal.  He has not yet been cleared for discharge by general surgery  Assessment/Plan Sacral decubitus with sacral osteomyelitis, gluteal abscess.  Status post debridement 3/22 at CIR and then 06/14/17.  Not a candidate for wound VAC per chart. --Continue ceftazidime for osteomyelitis, sacral wound, antibiotic stop date 07/25/17.  Needs weekly CBC with differential.  Fax weekly labs to [phone] --Follow-up in infectious disease clinic early May. --New tissue injury to the right lateral sacral wound per surgery.  Surgery has recommended Santyl.  ESRD --Not clear  the patient can tolerate sitting in a chair at this time, he reports severe pain from rheumatoid arthritis. --Continue management per nephrology  Acute blood loss anemia superimposed on anemia of chronic kidney disease, complicated by acute blood loss anemia from bleeding from gluteal abscess, acute illness.  Improved status post transfusion PRBC. --Hemoglobin lower today.  Check CBC in a.m.  Atrial fibrillation. --Remains stable.  Continue amiodarone.  Continue anticoagulation with apixaban.  Diabetes mellitus type 2 -- one episode of hypoglycemia today, will decrease Lantus.  Continue NovoLog sliding scale.  Discontinue meal coverage.  Rheumatoid arthritis.  Steroid on hold to assist with wound healing. --Having severe pain from rheumatoid arthritis which is limiting his ability to sit.  We will try low-dose prednisone and discuss options with surgery.  Chronic diastolic heart failure. --Appears stable.  Chronic leukocytosis, present since September 2018 with 1 brief interval abnormality January and February. --Declaire related to acute infection.  Follow clinically.  Deconditioning. 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 Recommend contrast enhanced CT or MRI when patient's condition warrants to exclude pancreatic neoplasm and renal neoplasm --Follow-up as an outpatient.   DVT prophylaxis: apixaban Code Status: Full Family Communication: none Disposition Plan: SNF when cleared by surgery.   Murray Hodgkins, MD  [facility] Direct contact: [phone] --Via amion app OR  --[email or web address]; password [email or web address]  7PM-7AM contact night coverage as above 07/01/2017, 4:03 PM  LOS: 23 days   Consultants:  General surgery  Infectious disease  Nephrology  Procedures:  4/1 debridement of 70cm^2 tissue involving subcutaneous tissue, fascia and muscle of right gluteal area  Transfusion 1 unit PRBC 4/5  Transfusion 1  unit PRBC  4/6  Transfusion 2 units PRBC 4/8  Antimicrobials:  Ceftazidime   Interval history/Subjective: Reports severe pain from rheumatoid arthritis limiting his ability to sit.  Objective: Vitals:  Vitals:   07/01/17 1030 07/01/17 1200  BP: (!) 96/56 (!) 80/48  Pulse: 100 100  Resp:    Temp:    SpO2:      Exam:  Constitutional:   . Appears calm, but uncomfortable. Respiratory:  . CTA bilaterally, no w/r/r.  . Respiratory effort normal Cardiovascular:  . RRR, no m/r/g . Minimal LE extremity edema   Musculoskeletal:  . Digits BUE: Hold fingers in extension, there is some edema. Psychiatric:  . Mental status o Mood, affect appropriate . judgement and insight appear intact  I have personally reviewed the following:   Labs:  Basic metabolic panel reviewed.  Potassium within normal limits.  WBC without significant change, 22.3.  Hemoglobin lower today, 8.1.  Scheduled Meds: . amiodarone  200 mg Oral Daily  . apixaban  5 mg Oral BID  . calcitRIOL  0.5 mcg Oral Q T,Th,Sat-1800  . collagenase   Topical Daily  . darbepoetin (ARANESP) injection - DIALYSIS  150 mcg Intravenous Q Thu-HD  . feeding supplement (GLUCERNA SHAKE)  237 mL Oral TID BM  . insulin aspart  0-15 Units Subcutaneous TID WC  . insulin aspart  0-5 Units Subcutaneous QHS  . insulin aspart  5 Units Subcutaneous TID WC  . insulin glargine  20 Units Subcutaneous QHS  . lanthanum  250 mg Oral TID WC  . sodium chloride flush  3 mL Intravenous Q12H   Continuous Infusions: . sodium chloride    . cefTAZidime (FORTAZ)  IV Stopped (07/01/17 1333)    Principal Problem:   Sacral osteomyelitis ([place]) Active Problems:   Hypertensive heart disease with heart failure (HCC)   LVH (left ventricular hypertrophy) due to hypertensive disease   Paroxysmal atrial fibrillation (HCC)   Insulin-dependent diabetes mellitus with renal complications (HCC)   Chronic diastolic CHF (congestive heart failure) (HCC)    ESRD on dialysis ([place])   Neuropathic pain   Pressure injury of sacral region, unstageable ([place])   Sacral wound   Anemia due to chronic kidney disease   LOS: 23 days

## 2017-07-01 NOTE — Progress Notes (Signed)
Clinical Social Worker following patient for discharge need. CSW has attained authorization through patients insurance for him to discharge to [facility] once medically stable.   Rhea Pink, MSW,  [place]

## 2017-07-01 NOTE — Progress Notes (Signed)
NCM and CSW spoke with pt regarding d/c plan : SNF/REHAB, [facility], bed available. NCM confirmed with pt  conversation had pertaining to him tolerating dialysis sitting for 3hrs. Pt states he can sit and have sat in the wheelchair 4-6 hrs without difficulty, however, he does require assistance with transfers. Whitman Hero RN,BSN,CM

## 2017-07-01 NOTE — Plan of Care (Signed)
Patient is making progress towards discharge goal

## 2017-07-01 NOTE — Progress Notes (Signed)
Patient complained of pain but does not want pain medications, patient was educated about pain and pain management. patient was encouraged to take tylenol for his pain instead of no medication.  Wound care performed, tolerated well no acute distress noted.

## 2017-07-02 LAB — GLUCOSE, CAPILLARY
GLUCOSE-CAPILLARY: 103 mg/dL — AB (ref 65–99)
GLUCOSE-CAPILLARY: 203 mg/dL — AB (ref 65–99)
Glucose-Capillary: 172 mg/dL — ABNORMAL HIGH (ref 65–99)
Glucose-Capillary: 149 mg/dL — ABNORMAL HIGH (ref 65–99)

## 2017-07-02 LAB — CBC
HCT: 26.4 % — ABNORMAL LOW (ref 39.0–52.0)
Hemoglobin: 8.6 g/dL — ABNORMAL LOW (ref 13.0–17.0)
MCH: 28.9 pg (ref 26.0–34.0)
MCHC: 32.6 g/dL (ref 30.0–36.0)
MCV: 88.6 fL (ref 78.0–100.0)
PLATELETS: 306 10*3/uL (ref 150–400)
RBC: 2.98 MIL/uL — AB (ref 4.22–5.81)
RDW: 18.6 % — ABNORMAL HIGH (ref 11.5–15.5)
WBC: 23.5 10*3/uL — ABNORMAL HIGH (ref 4.0–10.5)

## 2017-07-02 MED ORDER — DICLOFENAC SODIUM 1 % TD GEL
4.0000 g | Freq: Two times a day (BID) | TRANSDERMAL | Status: DC | PRN
  Filled 2017-07-02: qty 100

## 2017-07-02 MED ORDER — DARBEPOETIN ALFA 200 MCG/0.4ML IJ SOSY: 200.0000 ug | PREFILLED_SYRINGE | INTRAMUSCULAR | Status: DC

## 2017-07-02 NOTE — Progress Notes (Signed)
Patient ID: John Bailey, male   DOB: December 22, 1970, 47 y.o.   MRN: [id number] [facility] Progress Note   Assessment/ Plan:    1. Sacral decubitus with osteomyelitis, gluteal abscess: Afebrile with persistent leukocytosis.  On ceftazidime for osteomyelitis/sacral wound-Morganella (antibiotic stop date 07/25/17).   2. ESRD: Continue hemodialysis TTS.  Will try to come in chair tomorrow.  3. Anemia: Hgb 8.1, Increase Aranesp to 200 mcg weekly  4. CKD-MBD:Fosrenol as binder, PTH 230 04/2017  5. Nutrition:renal diet, protein supps  6. Vascular access- LAVF placed 05/10/17-maturation appears to be on the sluggish side, continue using TDC at this time.  7. Atrial fibrillation-rate controlled and on anticoagulation with Eliquis  8. Deconditioning: Case management working closely with patient to help decide on disposition.  9. RA: MTX contraindicated in ESRD, on antibiotics so biologics are high risk at this time.  Pred 10 mg restarted- hopefully this will help as well.  I'm going to give something topical too- pt requesting Voltaren gel.   Subjective:    Willing to try to come in the chair tomorrow for HD.     Objective:   BP 109/77 (BP Location: Right Arm)   Pulse 96   Temp 98.7 F (37.1 C) (Oral)   Resp 18   Ht 6' (1.829 m)   Wt 110.3 kg (243 lb 2.7 oz)   SpO2 99%   BMI 32.98 kg/m   Physical Exam: Gen: Comfortable resting in bed, friend at bedside CVS: Pulse regular rhythm, normal rate, normal S1 and S2 Resp: Diminished breath sounds with bases, no rales/rhonchi Abd: Soft, obese, nontender Ext: No lower extremity edema MSK: sarcopenia, some hyperpigmentation and mild warmth of MCPs and PIPs bilaterally ACCESS: R IJ TDC in place, L RC fistula + T/B  Labs: BMET Recent Labs  Lab 06/26/17 0955 06/28/17 0655 06/29/17 0717 07/01/17 0841  NA 132* 134* 133* 130*  K 4.9 4.3 3.9 4.2  CL 97* 96* 97* 94*  CO2 25 24 22 24   GLUCOSE 56* 114* 82 103*  BUN 37*  25* 33* 25*  CREATININE 4.99* 5.16* 6.57* 5.89*  CALCIUM 8.6* 8.7* 8.5* 8.3*  PHOS 4.0 3.7 4.9* 3.7   CBC Recent Labs  Lab 06/26/17 0956 06/28/17 0655 06/29/17 0717 07/01/17 0841  WBC 27.0* 24.3* 19.0* 22.3*  NEUTROABS  --  23.3*  --   --   HGB 9.4* 10.5* 9.3* 8.1*  HCT 30.4* 33.0* 29.5* 26.3*  MCV 90.5 90.7 89.4 89.5  PLT 365 297 289 283   Medications:    . amiodarone  200 mg Oral Daily  . apixaban  5 mg Oral BID  . calcitRIOL  0.5 mcg Oral Q T,Th,Sat-1800  . collagenase   Topical Daily  . darbepoetin (ARANESP) injection - DIALYSIS  150 mcg Intravenous Q Thu-HD  . feeding supplement (GLUCERNA SHAKE)  237 mL Oral TID BM  . insulin aspart  0-15 Units Subcutaneous TID WC  . insulin aspart  0-5 Units Subcutaneous QHS  . insulin glargine  15 Units Subcutaneous QHS  . lanthanum  250 mg Oral TID WC  . predniSONE  10 mg Oral Q breakfast  . sodium chloride flush  3 mL Intravenous Q12H   Madelon Lips, MD [phone] 07/02/2017, 1:07 PM

## 2017-07-02 NOTE — Progress Notes (Signed)
PROGRESS NOTE  John Bailey:[id number] DOB: 1970-05-08 DOA: 06/08/2017 PCP: [name], MD  Brief Narrative: 47 year old man with a 3-week hospitalization in February (2/7-2/28) with subsequent discharge to CIR.  At that time he was treated for acute on chronic diastolic heart failure but failed to improve her diuretics and therefore with diuretics.  Hemodialysis was initiated on admission.  He was then hospitalized at [facility] for nearly 1 month (2/28-3/26) for debility secondary to cardiorenal syndrome.  During that hospitalization he was seen by nephrology, cardiology (atrial fibrillation CHF), as well as by general surgery for debridement of sacral decubitus 3/22.  CIR discharged the patient 3/26, however per surgery the patient was not a candidate for discharge home and therefore the patient was readmitted to the hospital for management of his wounds until such time general surgery felt the patient was stable for discharge.  He was followed by general surgery nephrology and underwent wound care.  He subsequently developed fever, hypotension, altered mental status and CT revealed gluteal abscess and he was transferred to the stepdown unit.  He was treated with antibiotics for sepsis and underwent debridement of gluteal abscess.  He had episodes of bleeding from his gluteal wounds requiring transfusion.  Wound has been very slow to heal.  He has not yet been cleared for discharge by general surgery  Assessment/Plan Sacral decubitus with sacral osteomyelitis, gluteal abscess. Status post debridement 3/22 at CIR and then 06/14/17.  Not a candidate for wound VAC per chart. --Continue ceftazidime for osteomyelitis, sacral wound, antibiotic stop date 07/25/17.  Needs weekly CBC with differential.  Fax weekly labs to [phone] --Follow-up in infectious disease clinic early May. --New tissue injury to the right lateral sacral wound per surgery.  Surgery has recommended Santyl.  Await their  follow-up recommendations.  Patient will not be ready for discharge to skilled nursing facility until cleared by general surgery.  If requires hydrotherapy, will pursue LTAC.  ESRD --Pain from rheumatoid arthritis much improved today.  He is going to try to sit for dialysis tomorrow. --Management per nephrology.  Acute blood loss anemia superimposed on anemia of chronic kidney disease, complicated by acute blood loss anemia from bleeding from gluteal abscess, acute illness.  Improved status post transfusion PRBC. --Hemoglobin was trending down.  Check CBC.  Atrial fibrillation. --Remains stable.  Continue amiodarone and apixaban  Diabetes mellitus type 2 --No recurrent hypoglycemia.  Continue Lantus at current dose.  Continue NovoLog sliding scale.    Rheumatoid arthritis.  Steroid on hold to assist with wound healing. --Pain much improved with low-dose prednisone.  Chronic diastolic heart failure. --Stable.  Chronic leukocytosis, present since September 2018 with 1 brief interval abnormality January and February. --Thought related to acute infection.  Follow clinically.  Deconditioning. 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 Recommend contrast enhanced CT or MRI when patient's condition warrants to exclude pancreatic neoplasm and renal neoplasm --Follow-up as an outpatient.   DVT prophylaxis: apixaban Code Status: Full Family Communication: none Disposition Plan: SNF when cleared by surgery.   Murray Hodgkins, MD  [facility] Direct contact: [phone] --Via amion app OR  --[email or web address]; password [email or web address]  7PM-7AM contact night coverage as above 07/02/2017, 3:01 PM  LOS: 24 days   Consultants:  General surgery  Infectious disease  Nephrology  Procedures:  4/1 debridement of 70cm^2 tissue involving subcutaneous tissue, fascia and muscle of right gluteal area  Transfusion 1 unit PRBC 4/5  Transfusion  1 unit PRBC  4/6  Transfusion 2 units PRBC 4/8  Antimicrobials:  Ceftazidime   Interval history/Subjective: Feels okay today.  Pain better controlled today from rheumatoid arthritis.  Objective: Vitals:  Vitals:   07/02/17 0820 07/02/17 1453  BP: 109/77 115/70  Pulse: 96 98  Resp:  18  Temp:  98.8 F (37.1 C)  SpO2:  99%    Exam:  Constitutional:   . Appears calm and comfortable Respiratory:  . CTA bilaterally, no w/r/r.  . Respiratory effort normal Cardiovascular:  . RRR, no m/r/g Musculoskeletal:  . Moving hands better today. Psychiatric:  . Mental status o Mood, affect appropriate  I have personally reviewed the following:   Labs:  Blood sugars remain stable  Scheduled Meds: . amiodarone  200 mg Oral Daily  . apixaban  5 mg Oral BID  . calcitRIOL  0.5 mcg Oral Q T,Th,Sat-1800  . collagenase   Topical Daily  . [START ON 07/08/2017] darbepoetin (ARANESP) injection - DIALYSIS  200 mcg Intravenous Q Thu-HD  . feeding supplement (GLUCERNA SHAKE)  237 mL Oral TID BM  . insulin aspart  0-15 Units Subcutaneous TID WC  . insulin aspart  0-5 Units Subcutaneous QHS  . insulin glargine  15 Units Subcutaneous QHS  . lanthanum  250 mg Oral TID WC  . predniSONE  10 mg Oral Q breakfast  . sodium chloride flush  3 mL Intravenous Q12H   Continuous Infusions: . sodium chloride    . 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

## 2017-07-02 NOTE — Progress Notes (Signed)
NCM received consult for LTAC pending reintroducing of hydrotherapy. Awaiting surgical team's plan. [facility]  can't offer bed, no HD beds available per [facility] liaison Carrina @ [phone]. NCM spoke with [facility] liaison, Loury @ [phone] and learned [facility] does have HD beds available, however, pt continues to refuse [facility]. States he will not go to [facility]. Hospital MD made aware. Whitman Hero RN,BSN,CM

## 2017-07-02 NOTE — Plan of Care (Signed)
Patient is making progress toward discharge 

## 2017-07-03 LAB — CBC
HEMATOCRIT: 25.1 % — AB (ref 39.0–52.0)
HEMOGLOBIN: 8.2 g/dL — AB (ref 13.0–17.0)
MCH: 28.6 pg (ref 26.0–34.0)
MCHC: 32.7 g/dL (ref 30.0–36.0)
MCV: 87.5 fL (ref 78.0–100.0)
Platelets: 288 10*3/uL (ref 150–400)
RBC: 2.87 MIL/uL — AB (ref 4.22–5.81)
RDW: 18.3 % — ABNORMAL HIGH (ref 11.5–15.5)
WBC: 19 10*3/uL — AB (ref 4.0–10.5)

## 2017-07-03 LAB — RENAL FUNCTION PANEL
ALBUMIN: 1.9 g/dL — AB (ref 3.5–5.0)
ANION GAP: 13 (ref 5–15)
BUN: 24 mg/dL — AB (ref 6–20)
CO2: 22 mmol/L (ref 22–32)
Calcium: 8.6 mg/dL — ABNORMAL LOW (ref 8.9–10.3)
Chloride: 95 mmol/L — ABNORMAL LOW (ref 101–111)
Creatinine, Ser: 5.8 mg/dL — ABNORMAL HIGH (ref 0.61–1.24)
GFR calc Af Amer: 12 mL/min — ABNORMAL LOW (ref >60)
GFR calc non Af Amer: 11 mL/min — ABNORMAL LOW (ref >60)
Glucose, Bld: 123 mg/dL — ABNORMAL HIGH (ref 65–99)
PHOSPHORUS: 3.6 mg/dL (ref 2.5–4.6)
POTASSIUM: 4.1 mmol/L (ref 3.5–5.1)
Sodium: 130 mmol/L — ABNORMAL LOW (ref 135–145)

## 2017-07-03 LAB — GLUCOSE, CAPILLARY: GLUCOSE-CAPILLARY: 75 mg/dL (ref 65–99)

## 2017-07-03 MED ORDER — LANTHANUM CARBONATE 500 MG PO CHEW: 250.0000 mg | CHEWABLE_TABLET | Freq: Three times a day (TID) | ORAL | Status: DC

## 2017-07-03 MED ORDER — COLLAGENASE 250 UNIT/GM EX OINT: TOPICAL_OINTMENT | Freq: Every day | CUTANEOUS | Status: DC

## 2017-07-03 MED ORDER — WHITE PETROLATUM EX OINT
TOPICAL_OINTMENT | CUTANEOUS | Status: AC
  Administered 2017-07-03: 14:00:00
  Filled 2017-07-03: qty 28.35

## 2017-07-03 MED ORDER — INSULIN GLARGINE 100 UNIT/ML ~~LOC~~ SOLN: 15.0000 [IU] | Freq: Every day | SUBCUTANEOUS | Status: AC

## 2017-07-03 MED ORDER — GLUCERNA SHAKE PO LIQD
237.0000 mL | Freq: Three times a day (TID) | ORAL | 0 refills | Status: DC
Start: 2017-07-03 — End: 2019-02-10

## 2017-07-03 MED ORDER — DICLOFENAC SODIUM 1 % TD GEL: 4.0000 g | Freq: Two times a day (BID) | TRANSDERMAL | Status: DC | PRN

## 2017-07-03 MED ORDER — SODIUM CHLORIDE 0.9 % IV SOLN: 2.0000 g | INTRAVENOUS | Status: DC

## 2017-07-03 MED ORDER — PREDNISONE 10 MG PO TABS: 10.0000 mg | ORAL_TABLET | Freq: Every day | ORAL | Status: DC

## 2017-07-03 NOTE — Progress Notes (Signed)
[place] for [facility] Indication: Osteo, wound infection   No Known Allergies  Patient Measurements: Height: 6' (182.9 cm) Weight: 243 lb 2.7 oz (110.3 kg) IBW/kg (Calculated) : 77.6 Adjusted Body Weight:    Vital Signs: Temp: 97.9 F (36.6 C) (04/20 0504) Temp Source: Oral (04/20 0504) BP: 121/86 (04/20 0504) Pulse Rate: 87 (04/20 0504) Intake/Output from previous day: 04/19 0701 - 04/20 0700 In: 503 [P.O.:500; I.V.:3] Out: 1 [Stool:1] Intake/Output from this shift: Total I/O In: 3 [I.V.:3] Out: -   Labs: Recent Labs    07/01/17 0841 07/02/17 1700 07/03/17 0622  WBC 22.3* 23.5* 19.0*  HGB 8.1* 8.6* 8.2*  PLT 283 306 288  CREATININE 5.89*  --   --    Estimated Creatinine Clearance: 20.5 mL/min (A) (by C-G formula based on SCr of 5.89 mg/dL (H)). No results for input(s): VANCOTROUGH, VANCOPEAK, VANCORANDOM, GENTTROUGH, GENTPEAK, GENTRANDOM, TOBRATROUGH, TOBRAPEAK, TOBRARND, AMIKACINPEAK, AMIKACINTROU, AMIKACIN in the last 72 hours.   Microbiology:   Medical History: Past Medical History:  Diagnosis Date  . Atrial flutter with rapid ventricular response ([place]) 08/06/2015   a. s/p DCCV in 07/2015 with recurrent atrial fibrillation and DCCV in 12/2016. Initially successful but noted to be back in atrial fibrillation within 2 weeks of DCCV.   [name] Chronic diastolic (congestive) heart failure (HCC)    a. EF 50-55% by echo in 06/2016.  . Diabetes ([place])   . Dysrhythmia    Aflutter  . ESRD (end stage renal disease) on dialysis ([place])    "TTS; at [facility]" (06/08/2017)  . Hypertension   . Peripheral vascular disease ([place])   . Rheumatoid arthritis (HCC)    Assessment: ID: John Bailey [id number], osteo in sacral ulcer s/p debridement 3/22, repeat I&D gluteal abscess on 4/1. Per ID will need 6 weeks tx.  Surgery also concerned about PSA with green drainage. WBC=19 down,  Afeb.  John Bailey 4/3>>(5/13) Zosyn 4/1 >> 4/3 Vanc 4/1 >> 4/4  3/31 BCx: 1/2 MRSE  (contamination) 4/1 abscess - morganella morganii (sens to rocephin) 4/3 blood x2- ngtd 3/26 & 4/2 MRSA PCR neg  Goal of Therapy:  Eradication of infection  Plan:  - Fortaz 2gm TThS w/ HD . No dose changes   John Bailey A. Levada Dy, PharmD, [place] Pager: [phone]  07/03/2017,11:28 AM

## 2017-07-03 NOTE — Progress Notes (Signed)
Patient ID: John Bailey, male   DOB: 1970-05-13, 47 y.o.   MRN: [id number]   Asked to assess the decub today. It looks good with excellent granulation tissue.  There is nothing else to do from a surgical standpoint this admission. Ok from our standpoint for SNF or rehab.

## 2017-07-03 NOTE — Progress Notes (Signed)
He was reevaluated by general surgery today and the gluteal wound appears good but the sacral decubitus wound has not yet been visualized.  Dr. Ninfa Linden will visualize and give recommendations when able.  Patient not ready for discharge until general surgery has assessed sacral wound and cleared for discharge  One other component of discharge is the need for toleration of hemodialysis while sitting.  Patient will attempt this today.    If able to tolerate sitting for dialysis, and the sacral wound is acceptable from a surgical standpoint, then he could be discharged to skilled nursing facility 4/21.  This evening he is currently in hemodialysis.  Murray Hodgkins, MD [facility] [phone]

## 2017-07-03 NOTE — Progress Notes (Signed)
Patient ID: John Bailey, male   DOB: Mar 18, 1970, 47 y.o.   MRN: [id number] [facility] Progress Note   Assessment/ Plan:    1. Sacral decubitus with osteomyelitis, gluteal abscess: Afebrile with persistent leukocytosis.  On ceftazidime for osteomyelitis/sacral wound-Morganella (antibiotic stop date 07/25/17).   2. ESRD: Continue hemodialysis TTS.  Will try to come in chair today.  This is the barrier to getting him to d/c- needs to do HD in the chair.  He has been CLIP'd and has a spot at [facility] TTS 2nd shift.  3. Anemia: Hgb 8.1, Increase Aranesp to 200 mcg weekly  4. CKD-MBD:Fosrenol as binder, PTH 230 04/2017  5. Nutrition:renal diet, protein supps  6. Vascular access- LAVF placed 05/10/17-maturation appears to be on the sluggish side, continue using TDC at this time.  7. Atrial fibrillation-rate controlled and on anticoagulation with Eliquis  8. Deconditioning: SNF recommended.  9. RA: MTX contraindicated in ESRD, on antibiotics so biologics are high risk at this time.  Pred 10 mg restarted- hopefully this will help as well.  I'm going to give something topical too- pt requesting Voltaren gel.  10. Dispo: if gets in the chair for HD today and is successful he is OK to go from a renal perspective.   Subjective:    Pt will try to come in chair for HD today.  Has been cleared from surgery.   Objective:   BP 121/86 (BP Location: Right Arm)   Pulse 87   Temp 97.9 F (36.6 C) (Oral)   Resp 18   Ht 6' (1.829 m)   Wt 110.3 kg (243 lb 2.7 oz)   SpO2 96%   BMI 32.98 kg/m   Physical Exam: Gen: Comfortable resting in bed,  CVS: Pulse regular rhythm, normal rate, normal S1 and S2 Resp: Diminished breath sounds with bases, no rales/rhonchi Abd: Soft, obese, nontender Ext: No lower extremity edema MSK: sarcopenia, some hyperpigmentation and mild warmth of MCPs and PIPs bilaterally which has improved ACCESS: R IJ TDC in place, L RC fistula +  T/B  Labs: BMET Recent Labs  Lab 06/28/17 0655 06/29/17 0717 07/01/17 0841  NA 134* 133* 130*  K 4.3 3.9 4.2  CL 96* 97* 94*  CO2 24 22 24   GLUCOSE 114* 82 103*  BUN 25* 33* 25*  CREATININE 5.16* 6.57* 5.89*  CALCIUM 8.7* 8.5* 8.3*  PHOS 3.7 4.9* 3.7   CBC Recent Labs  Lab 06/28/17 0655 06/29/17 0717 07/01/17 0841 07/02/17 1700 07/03/17 0622  WBC 24.3* 19.0* 22.3* 23.5* 19.0*  NEUTROABS 23.3*  --   --   --   --   HGB 10.5* 9.3* 8.1* 8.6* 8.2*  HCT 33.0* 29.5* 26.3* 26.4* 25.1*  MCV 90.7 89.4 89.5 88.6 87.5  PLT 297 289 283 306 288   Medications:    . amiodarone  200 mg Oral Daily  . apixaban  5 mg Oral BID  . calcitRIOL  0.5 mcg Oral Q T,Th,Sat-1800  . collagenase   Topical Daily  . [START ON 07/08/2017] darbepoetin (ARANESP) injection - DIALYSIS  200 mcg Intravenous Q Thu-HD  . feeding supplement (GLUCERNA SHAKE)  237 mL Oral TID BM  . insulin aspart  0-15 Units Subcutaneous TID WC  . insulin aspart  0-5 Units Subcutaneous QHS  . insulin glargine  15 Units Subcutaneous QHS  . lanthanum  250 mg Oral TID WC  . predniSONE  10 mg Oral Q breakfast  . sodium chloride flush  3 mL Intravenous  Q12H  . white petrolatum       Madelon Lips, MD [phone] 07/03/2017, 12:17 PM

## 2017-07-03 NOTE — Discharge Summary (Addendum)
Physician Discharge Summary  John Bailey:[id number] DOB: 02-22-1971 DOA: 06/08/2017  PCP: [name], MD  Admit date: 06/08/2017 Discharge date: 07/04/2017  Recommendations for Outpatient Follow-up:   Sacral decubitus with sacral osteomyelitis, gluteal abscess.  --Continue ceftazidime for osteomyelitis, sacral wound, antibiotic stop date 07/25/17.  Needs weekly CBC with differential.  Fax weekly labs to [phone] --Follow-up in infectious disease clinic early May. --Wet to dry dressings with Kerlix into each space.  Dry dressing over this.  Paper tape if you can get it to minimize tape burns.  Sterile saline for wetting agent.  BID scheduled changes to sacral wound and right gluteal wound and PRN for soiling with BM. --Apply Santyl to tissue injury right lateral to sacral wound daily --Will need follow up with the wound care clinic or plastics after discharge  Rheumatoid arthritis.  Steroid on hold to assist with wound healing. --Pain much improved with low-dose prednisone.  Discussed risk/benefit with patient in regard to wound healing.  He understands.  Given his need to tolerate sitting for hemodialysis, will continue low-dose prednisone at this point.  Recommend short course.  Chronic leukocytosis, present since September 2018 with 1 brief interval abnormality January and February. --Thought related to acute infection.  Follow clinically.  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 Recommend contrast enhanced CT or MRI when patient's condition warrants to exclude pancreatic neoplasm and renal neoplasm       Contact information for follow-up providers    [name], MD Follow up.   Specialty:  Internal Medicine Contact information: [place] [address] [phone]        [facility].   Specialty:  [place] Why:  They will call you to set up visits Contact  information: [place] [address] [phone]        Campbell Riches, MD. Schedule an appointment as soon as possible for a visit.   Specialty:  Infectious Diseases Why:  2 weeks Contact information: [place] [address] [phone]            Contact information for after-discharge care    Destination    [facility] .   Service:  Skilled Nursing Contact information: [address] [place] [phone]                   Discharge Diagnoses:  1. Sacral decubitus with sacral osteomyelitis, gluteal abscess 2. ESRD 3. Acute blood loss anemia superimposed on anemia of chronic kidney disease,  4. Atrial fibrillation. 5. Diabetes mellitus type 2 6. Rheumatoid arthritis 7. Chronic diastolic heart failure. 8. Chronic leukocytosis 9. Deconditioning. 10. 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  Discharge Condition: improved Disposition: SNF  Diet recommendation: Heart healthy, diabetic diet  Filed Weights   07/01/17 0634 07/01/17 0806 07/01/17 1216  Weight: 112 kg (247 lb) 112 kg (246 lb 14.6 oz) 110.3 kg (243 lb 2.7 oz)    History of present illness:  47 year old man with a 3-week hospitalization in February (2/7-2/28) with subsequent discharge to [facility].  At that time he was treated for acute on chronic diastolic heart failure but failed to improve her diuretics and therefore with diuretics.  Hemodialysis was initiated on admission.  He was then hospitalized at [facility] for nearly 1 month (2/28-3/26) for debility secondary to cardiorenal syndrome.  During that hospitalization he was seen by  nephrology, cardiology (atrial fibrillation CHF), as well as by general surgery for debridement of sacral decubitus 3/22.  [facility] discharged the patient 3/26, however per surgery the patient was not a candidate for discharge home and therefore the patient was readmitted to the  hospital for management of his wounds until such time general surgery felt the patient was stable for discharge.  Hospital Course:  He was followed by general surgery, nephrology and underwent wound care.  He subsequently developed fever, hypotension, altered mental status and CT revealed gluteal abscess and he was transferred to the stepdown unit.  He was treated with antibiotics for sepsis and underwent debridement of gluteal abscess.  He had episodes of bleeding from his gluteal wounds requiring transfusion.  Wounds have been very slow to heal.  He has not yet been cleared for discharge by general surgery.  He continues with twice daily dressing changes.  He was reevaluated by general surgery today and the gluteal wound appears good but the sacral decubitus wound has not yet been visualized.  He will need outpatient follow-up with the wound care center or plastics in the future.  One other component of discharge is the need for toleration of hemodialysis while sitting.  Patient will attempt this today.  Is able to tolerate sitting for dialysis, and the sacral wound is acceptable from a surgical standpoint, then he could be discharged to skilled nursing facility 4/21.  This evening he is currently in hemodialysis.  Sacral decubitus with sacral osteomyelitis, gluteal abscess. Status post debridement 3/22 at [facility] and then 06/14/17.  Not a candidate for wound VAC per chart. --Continue ceftazidime for osteomyelitis, sacral wound, antibiotic stop date 07/25/17.  Needs weekly CBC with differential.  Fax weekly labs to [phone] --Follow-up in infectious disease clinic early May. --As above awaiting surgical input on sacral decubitus.  Gluteal wound appears to be healing well.  ESRD --He is going to try to set up for hemodialysis today.  Pain from rheumatoid arthritis has improved. --Management per nephrology.  I discussed with Dr. Hollie Salk.    Acute blood loss anemia superimposed on anemia of chronic kidney  disease, complicated by acute blood loss anemia from bleeding from gluteal abscess, acute illness.  Improved status post transfusion PRBC. --Hemoglobin remained stable.  Atrial fibrillation. --Remains stable.    Continue amiodarone and apixaban  Diabetes mellitus type 2 --Stable.  Continue Lantus.  Rheumatoid arthritis.  Steroid on hold to assist with wound healing. --Pain much improved with low-dose prednisone.  Chronic diastolic heart failure. --Remains stable with hemodialysis..  Chronic leukocytosis, present since September 2018 with 1 brief interval abnormality January and February. --Thought related to acute infection.  Follow clinically.  Deconditioning. 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 Recommend contrast enhanced CT or MRI when patient's condition warrants to exclude pancreatic neoplasm and renal neoplasm --Follow-up as an outpatient.  Consultants:  General surgery  Infectious disease  Nephrology  Procedures:  4/1 debridement of 70cm^2 tissue involving subcutaneous tissue, fascia and muscle of right gluteal area  Transfusion 1 unit PRBC 4/5  Transfusion 1 unit PRBC 4/6  Transfusion 2 units PRBC 4/8  Antimicrobials:  Ceftazidime   Today's assessment: S: Feels well, anxious to go to rehab.  Pain in hands from rheumatoid arthritis is decreased. O: Vitals:  Vitals:   07/03/17 1630 07/03/17 1700  BP: 117/74 115/75  Pulse: 92 95  Resp:    Temp:    SpO2:      Constitutional:  .  Appears calm and comfortable Respiratory:  . CTA bilaterally, no w/r/r.  . Respiratory effort normal Cardiovascular:  . RRR, no m/r/g . We will plus bilateral LE extremity edema   Musculoskeletal:  . Less swelling in hands, improved movement. Skin:  . Gluteal wound looks clean.  Sacral wound appears clean. Psychiatric:  . Mental status o Mood, affect appropriate   Discharge  Instructions   Allergies as of 07/03/2017   No Known Allergies     Medication List    STOP taking these medications   gabapentin 300 MG capsule Commonly known as:  NEURONTIN   insulin glargine 100 unit/mL Sopn Commonly known as:  LANTUS Replaced by:  insulin glargine 100 UNIT/ML injection   methocarbamol 500 MG tablet Commonly known as:  ROBAXIN   predniSONE 10 MG (21) Tbpk tablet Commonly known as:  STERAPRED UNI-PAK 21 TAB Replaced by:  predniSONE 10 MG tablet     TAKE these medications   amiodarone 200 MG tablet Commonly known as:  PACERONE Take 1 tablet (200 mg total) by mouth daily.   apixaban 5 MG Tabs tablet Commonly known as:  ELIQUIS Take 1 tablet (5 mg total) by mouth 2 (two) times daily.   calcitRIOL 0.5 MCG capsule Commonly known as:  ROCALTROL Take 1 capsule (0.5 mcg total) by mouth every Tuesday, Thursday, and Saturday at 6 PM.   carvedilol 6.25 MG tablet Commonly known as:  COREG Take 1 tablet (6.25 mg total) by mouth 2 (two) times daily with a meal.   cefTAZidime 2 g in sodium chloride 0.9 % 100 mL Inject 2 g into the vein Every Tuesday,Thursday,and Saturday with dialysis. Continue ceftazidime for osteomyelitis, sacral wound, antibiotic stop date 07/25/17.  Needs weekly CBC with differential.  Fax weekly labs to [phone]   collagenase ointment Commonly known as:  SANTYL Apply topically daily. Start taking on:  07/04/2017   diclofenac sodium 1 % Gel Commonly known as:  VOLTAREN Apply 4 g topically 2 (two) times daily as needed (apply over affected area).   feeding supplement (GLUCERNA SHAKE) Liqd Take 237 mLs by mouth 3 (three) times daily between meals.   insulin glargine 100 UNIT/ML injection Commonly known as:  LANTUS Inject 0.15 mLs (15 Units total) into the skin at bedtime. Replaces:  insulin glargine 100 unit/mL Sopn   lanthanum 500 MG chewable tablet Commonly known as:  FOSRENOL Chew 0.5 tablets (250 mg total) by mouth 3 (three)  times daily with meals.   predniSONE 10 MG tablet Commonly known as:  DELTASONE Take 1 tablet (10 mg total) by mouth daily with breakfast. Last dose 4/25 Start taking on:  07/04/2017 Replaces:  predniSONE 10 MG (21) Tbpk tablet      No Known Allergies  The results of significant diagnostics from this hospitalization (including imaging, microbiology, ancillary and laboratory) are listed below for reference.    Significant Diagnostic Studies: Ct Abdomen Pelvis Wo Contrast  Result Date: 06/14/2017 CLINICAL DATA:  Abdominal pain, sacral wound EXAM: CT ABDOMEN AND PELVIS WITHOUT CONTRAST TECHNIQUE: Multidetector CT imaging of the abdomen and pelvis was performed following the standard protocol without IV contrast. COMPARISON:  None. FINDINGS: Lower chest: Lung bases are clear. Hepatobiliary: No focal hepatic lesion. Postcholecystectomy. No biliary dilatation. Pancreas: Low-attenuation ovoid tissue towards the pancreatic head measuring 3.1 x 2.2 cm (image 32/3) is not fully characterize. No pancreatic duct dilatation upstream of this lesion. Spleen: Normal spleen Adrenals/Urinary Tract: Adrenal glands are normal. There is a lobular contour to the anterior  margin of the LEFT kidney measuring approximately 3.2 by 2.8 cm. This is not characterized on this noncontrast CT but has tissue density similar to the renal parenchyma. Ureters and bladder normal Stomach/Bowel: Stomach, small-bowel appendix are normal. The colon and rectosigmoid colon are normal. Vascular/Lymphatic: Abdominal aorta is normal caliber. There is no retroperitoneal or periportal lymphadenopathy. No pelvic lymphadenopathy. Reproductive: Post hysterectomy anatomy Other: No free fluid the abdomen pelvis. Musculoskeletal: Subcutaneous gas collection inferior and posterior to the last sacral element measuring 4.1 x 4.0 x 5.7 cm. This collection extends from the skin surface to the bone (coccyx). No CT evidence of osseous erosion. There is gas  within the RIGHT gluteus muscle which extends medially and laterally. There is a expanded gas collection within the midportion of the muscle measuring 2.2 by 2.7 cm (image 93/3). There are scattered foci of gas within the subcutaneous fat along the RIGHT gluteus muscle. The entirety of the muscle is not imaged. IMPRESSION: 1. Sacral ulcer extends from the skin surface to the most inferior sacral element posterior RIGHT of the coccyx. 2. Gas within the RIGHT gluteus muscle consistent with abscess. The entire gluteus muscle is not imaged. 3. 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 Recommend contrast enhanced CT or MRI when patient's condition warrants to exclude pancreatic neoplasm and renal neoplasm. These results will be called to the ordering clinician or representative by the Radiologist Assistant, and communication documented in the PACS or zVision Dashboard. Electronically Signed   By: Suzy Bouchard M.D.   On: 06/14/2017 16:18   Ct Head Wo Contrast  Result Date: 06/14/2017 CLINICAL DATA:  46 y/o  M; altered mental status. EXAM: CT HEAD WITHOUT CONTRAST TECHNIQUE: Contiguous axial images were obtained from the base of the skull through the vertex without intravenous contrast. COMPARISON:  None. FINDINGS: Brain: No evidence of acute infarction, hemorrhage, hydrocephalus, extra-axial collection or mass lesion/mass effect. Small hypodense foci in bifrontal subcortical white matter. Vascular: Calcific atherosclerosis of carotid siphons. No hyperdense vessel identified. Skull: Normal. Negative for fracture or focal lesion. Sinuses/Orbits: Small mucous retention cysts within the bilateral maxillary sinuses. Otherwise negative. Other: None. IMPRESSION: 1. No acute intracranial abnormality identified. 2. Small foci of hypoattenuation in bifrontal subcortical white matter, probably mild chronic microvascular ischemic changes. Electronically Signed   By: Kristine Garbe M.D.   On:  06/14/2017 18:38   Mr Lumbar Spine Wo Contrast  Result Date: 06/21/2017 CLINICAL DATA:  Fever of unknown origin in a diabetic patient. Skin ulceration over the coccyx. EXAM: MRI LUMBAR SPINE WITHOUT CONTRAST TECHNIQUE: Multiplanar, multisequence MR imaging of the lumbar spine was performed. No intravenous contrast was administered. COMPARISON:  MRI lumbar spine 04/22/2011. FINDINGS: Segmentation:  Standard. Alignment:  Maintained. Vertebrae: No fracture or worrisome lesion. Please note that the coccyx is not included on this examination. No evidence of discitis or osteomyelitis is seen. Conus medullaris and cauda equina: Conus extends to the L2 level. Conus and cauda equina appear normal. Paraspinal and other soft tissues: There is partial visualization of a decubitus ulcer over the coccyx. Disc levels: T11-12 and T12-L1 are imaged in the sagittal plane only and negative. L1-2: Negative. L2-3: Mild facet degenerative change.  Otherwise negative. L3-4: Mild facet degenerative change.  Otherwise negative. L4-5: Very shallow disc bulge without central canal stenosis. There is a small protrusion in the right foramen causing mild foraminal narrowing. The left foramen is open. L5-S1: Very shallow right paracentral protrusion without stenosis. IMPRESSION: Partial visualization of a decubitus ulcer over the  coccyx. Negative for discitis or osteomyelitis. Please note that the coccyx is not included on this lumbar spine MRI. Mild right foraminal narrowing L4-5 due to a small disc protrusion. Electronically Signed   By: Inge Rise M.D.   On: 06/21/2017 12:17   Dg Chest Port 1 View  Result Date: 06/14/2017 CLINICAL DATA:  Sleep apnea EXAM: PORTABLE CHEST 1 VIEW COMPARISON:  05/04/2017 FINDINGS: The heart is severely enlarged. Vascular congestion is worse. No Kerley B lines. Right jugular dialysis catheter is stable. No pneumothorax. No pleural effusion. IMPRESSION: Cardiomegaly and vascular congestion. Vascular  congestion has worsened. No convincing evidence of pulmonary edema. Electronically Signed   By: Marybelle Killings M.D.   On: 06/14/2017 17:05    Labs: Basic Metabolic Panel: Recent Labs  Lab 06/28/17 0655 06/29/17 0717 07/01/17 0841 07/03/17 1431  NA 134* 133* 130* 130*  K 4.3 3.9 4.2 4.1  CL 96* 97* 94* 95*  CO2 24 22 24 22   GLUCOSE 114* 82 103* 123*  BUN 25* 33* 25* 24*  CREATININE 5.16* 6.57* 5.89* 5.80*  CALCIUM 8.7* 8.5* 8.3* 8.6*  PHOS 3.7 4.9* 3.7 3.6   Liver Function Tests: Recent Labs  Lab 06/28/17 0655 06/29/17 0717 07/01/17 0841 07/03/17 1431  ALBUMIN 2.0* 1.8* 1.6* 1.9*   CBC: Recent Labs  Lab 06/28/17 0655 06/29/17 0717 07/01/17 0841 07/02/17 1700 07/03/17 0622  WBC 24.3* 19.0* 22.3* 23.5* 19.0*  NEUTROABS 23.3*  --   --   --   --   HGB 10.5* 9.3* 8.1* 8.6* 8.2*  HCT 33.0* 29.5* 26.3* 26.4* 25.1*  MCV 90.7 89.4 89.5 88.6 87.5  PLT 297 289 283 306 288    Recent Labs    03/06/17 1800 04/22/17 1734 04/23/17 0736  BNP 791.5* 531.3* 627.9*    CBG: Recent Labs  Lab 07/01/17 2123 07/02/17 0741 07/02/17 1200 07/02/17 1722 07/02/17 2110  GLUCAP 131* 103* 172* 203* 149*    Principal Problem:   Sacral osteomyelitis (HCC) Active Problems:   Hypertensive heart disease with heart failure (HCC)   LVH (left ventricular hypertrophy) due to hypertensive disease   Paroxysmal atrial fibrillation (HCC)   Insulin-dependent diabetes mellitus with renal complications (HCC)   Chronic diastolic CHF (congestive heart failure) (HCC)   ESRD on dialysis (HCC)   Neuropathic pain   Pressure injury of sacral region, unstageable ([place])   Sacral wound   Anemia due to chronic kidney disease   Time coordinating discharge: 45 minutes  Signed:  Murray Hodgkins, MD [facility] 07/03/2017, 5:20 PM   Addendum: Discharge date changed to reflect actual date of discharge.  Cordelia Poche, MD [facility] 07/04/2017, 2:42 PM Pager: [phone]

## 2017-07-04 LAB — GLUCOSE, CAPILLARY
GLUCOSE-CAPILLARY: 79 mg/dL (ref 65–99)
Glucose-Capillary: 123 mg/dL — ABNORMAL HIGH (ref 65–99)

## 2017-07-04 NOTE — Progress Notes (Signed)
CSW left message for Admission contact [facility] concerning disposition planning. Reed Breech LCSWA [phone]

## 2017-07-04 NOTE — Progress Notes (Signed)
Patient will Discharge QZ:[id number] Place Anticipated DC Date: 07/04/17 Family Notified:yes, Emmet Messer, [phone] Transport FP:ULGS   Per MD patient ready for DC to [facility] . RN, patient, patient's family, and facility notified of DC. Assessment, Fl2/Pasrr, and Discharge Summary sent to facility. RN given number for report [phone]). DC packet on chart. Ambulance transport requested for patient.   CSW signing off.  Reed Breech LCSWA [phone]

## 2017-07-04 NOTE — Progress Notes (Signed)
Attempted to give report to the [facility]. I left my phone number and information for them to call back.

## 2017-07-04 NOTE — Progress Notes (Signed)
Dressing change was done.

## 2017-07-04 NOTE — Progress Notes (Signed)
John Bailey to be D/C'd Rehab per MD order.  Discussed with the patient and all questions fully answered.  VSS, Skin clean, dry and intact without evidence of skin break down, no evidence of skin tears noted. IV catheter discontinued intact. Site without signs and symptoms of complications. Dressing and pressure applied.  An After Visit Summary was printed and given to the patient. Patient received prescription.  D/c education completed with patient/family including follow up instructions, medication list, d/c activities limitations if indicated, with other d/c instructions as indicated by MD - patient able to verbalize understanding, all questions fully answered.   Patient instructed to return to ED, call 911, or call MD for any changes in condition.   Patient escorted via stretcher, and D/C [employer] via [place].  [name] 07/04/2017 4:31 PM

## 2017-07-04 NOTE — Progress Notes (Addendum)
CC: sacral wound  Patient seen and examined. Feels good today. Tolerated HD in a chair well. General surgery has cleared patient for discharge from their standpoint. Patient to be discharged to SNF. Patient stable for discharge to SNF. No change to discharge summary dated 4/20 other than discharge date is 07/04/17.   BP 134/77 (BP Location: Right Arm)   Pulse 97   Temp 98.9 F (37.2 C) (Oral)   Resp 17   Ht 6' (1.829 m)   Wt 110.3 kg (243 lb 2.7 oz)   SpO2 100%   BMI 32.98 kg/m    General: Well appearing, no distress Respiratory: Clear to auscultation bilaterally. Unlabored work of breathing. No wheezing or rales. Cardiovascular: Regular rate and rhythm. Normal S1 and S2. No heart murmurs present. No extra heart sounds  A/P Sacral wound/osteomyelitis: continue abx. Outpatient antibiotics set up. End date of 07/25/17 ESRD: HD. Patient has an outpatient center.  Cordelia Poche, MD [facility] 07/04/2017, 10:52 AM Pager: [phone]

## 2017-07-04 NOTE — Progress Notes (Signed)
Patient ID: John Bailey, male   DOB: May 28, 1970, 47 y.o.   MRN: [id number] [facility] Progress Note   Assessment/ Plan:    1. Sacral decubitus with osteomyelitis, gluteal abscess: Afebrile with persistent leukocytosis.  On ceftazidime for osteomyelitis/sacral wound-Morganella (antibiotic stop date 07/25/17).   2. ESRD: Continue hemodialysis TTS.  He was successful coming in the chair yesterday 4/21.  He has been CLIP'd and has a spot at [facility] TTS 2nd shift.  3. Anemia: Hgb 8.1, Increase Aranesp to 200 mcg weekly  4. CKD-MBD:Fosrenol as binder, PTH 230 04/2017  5. Nutrition:renal diet, protein supps  6. Vascular access- LAVF placed 05/10/17-maturation appears to be on the sluggish side, continue using TDC at this time.  7. Atrial fibrillation-rate controlled and on anticoagulation with Eliquis  8. Deconditioning: SNF recommended.  9. RA: MTX contraindicated in ESRD, on antibiotics so biologics are high risk at this time.  Pred 10 mg restarted- hopefully this will help as well.  I'm going to give something topical too- pt requesting Voltaren gel.  10. Dispo: OK to go from our perspective.   Subjective:    Got in the chair for HD yesterday!   Objective:   BP 134/77 (BP Location: Right Arm)   Pulse 97   Temp 98.9 F (37.2 C) (Oral)   Resp 17   Ht 6' (1.829 m)   Wt 110.3 kg (243 lb 2.7 oz)   SpO2 100%   BMI 32.98 kg/m   Physical Exam: Gen: Comfortable resting in chair with sacral cushion CVS: Pulse regular rhythm, normal rate, normal S1 and S2 Resp: Diminished breath sounds with bases, no rales/rhonchi Abd: Soft, obese, nontender Ext: No lower extremity edema MSK: sarcopenia, some hyperpigmentation and mild warmth of MCPs and PIPs bilaterally which has improved ACCESS: R IJ TDC in place, L RC fistula + T/B  Labs: BMET Recent Labs  Lab 06/28/17 0655 06/29/17 0717 07/01/17 0841 07/03/17 1431  NA 134* 133* 130* 130*  K 4.3 3.9 4.2 4.1  CL 96* 97*  94* 95*  CO2 24 22 24 22   GLUCOSE 114* 82 103* 123*  BUN 25* 33* 25* 24*  CREATININE 5.16* 6.57* 5.89* 5.80*  CALCIUM 8.7* 8.5* 8.3* 8.6*  PHOS 3.7 4.9* 3.7 3.6   CBC Recent Labs  Lab 06/28/17 0655 06/29/17 0717 07/01/17 0841 07/02/17 1700 07/03/17 0622  WBC 24.3* 19.0* 22.3* 23.5* 19.0*  NEUTROABS 23.3*  --   --   --   --   HGB 10.5* 9.3* 8.1* 8.6* 8.2*  HCT 33.0* 29.5* 26.3* 26.4* 25.1*  MCV 90.7 89.4 89.5 88.6 87.5  PLT 297 289 283 306 288   Medications:    . amiodarone  200 mg Oral Daily  . apixaban  5 mg Oral BID  . calcitRIOL  0.5 mcg Oral Q T,Th,Sat-1800  . collagenase   Topical Daily  . [START ON 07/08/2017] darbepoetin (ARANESP) injection - DIALYSIS  200 mcg Intravenous Q Thu-HD  . feeding supplement (GLUCERNA SHAKE)  237 mL Oral TID BM  . insulin aspart  0-15 Units Subcutaneous TID WC  . insulin aspart  0-5 Units Subcutaneous QHS  . insulin glargine  15 Units Subcutaneous QHS  . lanthanum  250 mg Oral TID WC  . predniSONE  10 mg Oral Q breakfast  . sodium chloride flush  3 mL Intravenous Q12H   Madelon Lips, MD [phone] 07/04/2017, 12:15 PM

## 2017-07-04 NOTE — Progress Notes (Signed)
Progress Note: General Surgery Service   Assessment/Plan: Patient Active Problem List   Diagnosis Date Noted  . Anemia due to chronic kidney disease 06/30/2017  . Sacral osteomyelitis ([place])   . Sacral wound 06/08/2017  . Pressure injury of sacral region, unstageable ([place])   . Noncompliance   . Constipation   . Steroid-induced hyperglycemia   . HSV (herpes simplex virus) infection   . Neuropathic pain   . Labile blood pressure   . Labile blood glucose   . Carpal tunnel syndrome of left wrist   . Pain   . Leukocytosis   . PAF (paroxysmal atrial fibrillation) ([place])   . Acute on chronic diastolic (congestive) heart failure ([place])   . Type 2 diabetes mellitus with peripheral neuropathy (HCC)   . Acute blood loss anemia   . Anemia of chronic disease   . ESRD on dialysis ([place])   . Cardiorenal syndrome with renal failure   . Cardiorenal syndrome, stage 1-4 or unspecified chronic kidney disease, with heart failure ([place]) 05/06/2017  . Atrial fibrillation, chronic ([place]) 04/22/2017  . Hypoxia 04/22/2017  . Excessive daytime sleepiness 04/02/2017  . Debility 04/02/2017  . Bradycardia 03/28/2017  . CKD (chronic kidney disease) stage 4, GFR 15-29 ml/min (HCC) 03/28/2017  . Pseudogout 03/09/2017  . Rheumatoid arthritis ([place]) 03/09/2017  . Infected ulcer of skin, with fat layer exposed ([place]) 03/09/2017  . Normocytic anemia 03/06/2017  . Elevated troponin 03/06/2017  . Acute renal failure superimposed on stage 4 chronic kidney disease ([place]) 03/06/2017  . Essential hypertension 12/29/2016  . Persistent atrial fibrillation ([place])   . Left leg cellulitis   . Atrial fibrillation with RVR ([place]) 12/02/2016  . Sepsis ([place]) 12/02/2016  . Acute on chronic congestive heart failure ([place])   . Acute on chronic renal insufficiency   . Chronic diastolic CHF (congestive heart failure) ([place]) 03/20/2016  . Nonischemic cardiomyopathy ([place]) 11/11/2015  . Long term (current) use of anticoagulants [Z79.01]  08/14/2015  . Insulin-dependent diabetes mellitus with renal complications ([place]) 66/59/9357  . Atrial flutter with rapid ventricular response ([place]) 08/06/2015  . Hyponatremia 07/30/2015  . Acute kidney injury ([place]) 07/30/2015  . Hyperkalemia 07/30/2015  . Acute on chronic diastolic CHF (congestive heart failure) ([place]) 07/30/2015  . Morbid obesity ([place]) 07/30/2015  . Paroxysmal atrial fibrillation ([place]) 07/30/2015  . 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 Hypertensive heart disease with heart failure ([place]) 07/14/2012  . Dyspnea 07/14/2012  . Hyperlipidemia 07/14/2012   s/p Procedure(s): INCISION AND DRAINAGE GLUTEAL ABSCESS 06/14/2017 -both wounds look healthy with good granulation -from surgery standpoint can transition out of the hospital    LOS: 26 days  Chief Complaint/Subjective: No pain, happy for possibility of leaving hospital  Objective: Vital signs in last 24 hours: Temp:  [97.6 F (36.4 C)-98.9 F (37.2 C)] 98.9 F (37.2 C) (04/21 0403) Pulse Rate:  [86-102] 97 (04/21 0403) Resp:  [17-18] 17 (04/21 0403) BP: (107-138)/(70-91) 134/77 (04/21 0403) SpO2:  [96 %-100 %] 100 % (04/21 0403) Last BM Date: 07/02/17  Intake/Output from previous day: 04/20 0701 - 04/21 0700 In: 323 [P.O.:320; I.V.:3] Out: 2500  Intake/Output this shift: No intake/output data recorded.  Wound: right buttock wound with red healthy granulation tissue, no purulent drainage, sacral wound 4cm deep with red healthy granulation tissue and no purulent drainage  Lab Results: CBC  Recent Labs    07/02/17 1700 07/03/17 0622  WBC 23.5* 19.0*  HGB 8.6* 8.2*  HCT 26.4* 25.1*  PLT 306 288   BMET Recent Labs    07/03/17 1431  NA 130*  K 4.1  CL 95*  CO2 22  GLUCOSE 123*  BUN 24*  CREATININE 5.80*  CALCIUM 8.6*   PT/INR No results for input(s): LABPROT, INR in the last 72 hours. ABG No results for input(s): PHART, HCO3 in the last 72  hours.  Invalid input(s): PCO2, PO2  Studies/Results:  Anti-infectives: Anti-infectives (From admission, onward)   Start     Dose/Rate Route Frequency Ordered Stop   07/03/17 0000  cefTAZidime 2 g in sodium chloride 0.9 % 100 mL     2 g 200 mL/hr over 30 Minutes Intravenous Every T-Th-Sa (Hemodialysis) 07/03/17 1720     06/17/17 1200  cefTAZidime (FORTAZ) 2 g in sodium chloride 0.9 % 100 mL IVPB     2 g 200 mL/hr over 30 Minutes Intravenous Every T-Th-Sa (Hemodialysis) 06/16/17 1001 07/25/17 2359   06/17/17 0837  vancomycin (VANCOCIN) 1-5 GM/200ML-% IVPB    Note to Pharmacy:  Louretta Shorten   : cabinet override      06/17/17 0837 06/17/17 1123   06/16/17 1100  cefTAZidime (FORTAZ) 2 g in sodium chloride 0.9 % 100 mL IVPB     2 g 200 mL/hr over 30 Minutes Intravenous  Once 06/16/17 1001 06/16/17 1137   06/15/17 1502  vancomycin (VANCOCIN) 1-5 GM/200ML-% IVPB    Note to Pharmacy:  Herriott, Melisa   : cabinet override      06/15/17 1502 06/15/17 1553   06/15/17 1200  vancomycin (VANCOCIN) IVPB 1000 mg/200 mL premix  Status:  Discontinued     1,000 mg 200 mL/hr over 60 Minutes Intravenous Every T-Th-Sa (Hemodialysis) 06/14/17 1256 06/17/17 1153   06/14/17 1330  vancomycin (VANCOCIN) 2,000 mg in sodium chloride 0.9 % 500 mL IVPB     2,000 mg 250 mL/hr over 120 Minutes Intravenous  Once 06/14/17 1256 06/14/17 2229   06/14/17 1130  piperacillin-tazobactam (ZOSYN) IVPB 3.375 g  Status:  Discontinued     3.375 g 12.5 mL/hr over 240 Minutes Intravenous Every 12 hours 06/14/17 1057 06/16/17 0936      Medications: Scheduled Meds: . amiodarone  200 mg Oral Daily  . apixaban  5 mg Oral BID  . calcitRIOL  0.5 mcg Oral Q T,Th,Sat-1800  . collagenase   Topical Daily  . [START ON 07/08/2017] darbepoetin (ARANESP) injection - DIALYSIS  200 mcg Intravenous Q Thu-HD  . feeding supplement (GLUCERNA SHAKE)  237 mL Oral TID BM  . insulin aspart  0-15 Units Subcutaneous TID WC  . insulin aspart   0-5 Units Subcutaneous QHS  . insulin glargine  15 Units Subcutaneous QHS  . lanthanum  250 mg Oral TID WC  . predniSONE  10 mg Oral Q breakfast  . sodium chloride flush  3 mL Intravenous Q12H   Continuous Infusions: . sodium chloride    . cefTAZidime (FORTAZ)  IV Stopped (07/03/17 1855)   PRN Meds:.acetaminophen **OR** acetaminophen, bisacodyl, diclofenac sodium, HYDROcodone-acetaminophen, methocarbamol, naloxone, ondansetron **OR** ondansetron (ZOFRAN) IV, senna-docusate, traMADol  Mickeal Skinner, MD Pg# [phone] [facility]

## 2017-07-05 LAB — GLUCOSE, CAPILLARY
Glucose-Capillary: 99 mg/dL (ref 65–99)
Glucose-Capillary: 121 mg/dL — ABNORMAL HIGH (ref 65–99)

## 2017-07-07 ENCOUNTER — Ambulatory Visit (INDEPENDENT_AMBULATORY_CARE_PROVIDER_SITE_OTHER): Payer: BLUE CROSS/BLUE SHIELD | Admitting: Internal Medicine

## 2017-07-07 ENCOUNTER — Encounter: Payer: Self-pay | Admitting: Internal Medicine

## 2017-07-07 VITALS — BP 102/67 | HR 89 | Ht 71.0 in | Wt 243.0 lb

## 2017-07-07 DIAGNOSIS — I5032 Chronic diastolic (congestive) heart failure: Secondary | ICD-10-CM | POA: Diagnosis not present

## 2017-07-07 DIAGNOSIS — I480 Paroxysmal atrial fibrillation: Secondary | ICD-10-CM | POA: Diagnosis not present

## 2017-07-07 NOTE — Patient Instructions (Signed)
You have been referred to Roderic Palau, NP with the Atrial Fibrillation (A-Fib) Clinic. This clinic is located at [facility]. Dr. Debara Pickett would like you to have an appointment scheduled to see her.   Your physician wants you to follow-up in: ONE YEAR with Dr. Debara Pickett. You will receive a reminder letter in the mail two months in advance. If you don't receive a letter, please call our office to schedule the follow-up appointment.

## 2017-07-07 NOTE — Progress Notes (Signed)
OFFICE NOTE  Chief Complaint:  Hospital follow-up  Primary Care Physician: [name], MD  HPI:  John Bailey is a 47 y.o. male patient, previously followed by Dr. Acie Fredrickson, who has requested to see me after he week-long hospitalization for volume overload, atrial flutter with RVR and acute systolic congestive heart failure - LVEF now 35%. He also developed acute kidney injury and is being followed by Dr. Moshe Cipro. Past medical history is also significant for insulin-dependent diabetes, morbid obesity, LVH and dyslipidemia. He was admitted for atrial flutter with rapid ventricular response and underwent TEE cardioversion on 08/01/2015 which showed global hypokinesis and EF 25-30%. Surface echo a day prior to that showed EF of 35-40%. He was successfully cardioverted and has maintained sinus rhythm since that time. He had marked volume overload and diuresed more than 25 pounds. He continues to lose weight in fact his discharge weight was 136 kg and is currently 127 kg. He denies chest pain or shortness of breath. His INR was tested today as he is on warfarin and found to be 1.7. Adjustments were made to his Coumadin. Blood pressure remains elevated.  11/11/2015  John Bailey returns today for follow-up. He was seen last 3 months ago in hospital follow-up and since that time his been doing well. He denies any worsening shortness of breath or chest pain. Weight is actually up about 3 pounds since I last saw him. He was recently seen by Dr. Moshe Cipro at [facility]. She noted that Lyme status appears to be somewhat heavy despite being on high-dose Lasix, but he may be fairly euvolemic at this point. His hemoglobin A1c was tested and elevated at 7.6. Creatinine however did look somewhat better at 1.74. He was placed on warfarin because of atrial flutter but has remained in sinus rhythm since discharge. I believe warfarin was chosen due to his chronic kidney disease, but we  have been monitoring his INRs since discharge and over the past 3 months he has been rarely between 2 and 3 for therapeutic INR. He reports compliance with medications and says that he takes them regularly but may forget to take them on time yet he still remembers to take the medicines, this is despite some concern about chronic noncompliance. My concern is that he is not being adequately treated with warfarin as were unable to achieve a therapeutic INR despite being on essentially 10 mg of warfarin daily. Given that he has had improvement in his renal function, we discussed the possibility of going on to a novel oral anticoagulant.  03/20/2016  John Bailey returns today for follow-up. We had planned on reviewing a repeat echo today however that was not yet obtained. He recently saw his nephrologist and was noted to be hypertensive with excess volume and was started on metolazone in addition to his Lasix. Is not clear whether his LV function has improved. He is maintaining sinus rhythm. He denies any chest pain or worsening shortness of breath. He reported that his BiDil is no longer going to be [employer]. He is close to running out of that medication. He seems to be tolerating Xarelto without any significant bleeding problems.  07/31/2016  John Bailey was seen in follow-up today he reports that he is not any more recurrent atrial fibrillation is aware of. He denies any word worsening shortness of breath. Blood pressure today is a little bit elevated however says is better controlled at home. Weight is up actually over 20 pounds. He  does not note any edema rather he's not been able to exercise because of problems with his joints and has been on steroids therefore he says his appetite is increased. He denies any bleeding problems on Xarelto. He is been followed closely by Dr. Moshe Cipro who is adjusting his diuretics. Creatinine is actually improved.  04/02/2017  John Bailey returns today from  hospital follow-up -this is a transition of care visit.[name]  He was just discharged 2 days ago.  He was hospitalized for near syncopal event.  This was thought to be due to a combination of factors including possible bradycardia.  His carvedilol and Cardizem were decreased.  He also remains markedly volume overloaded.  His Lasix was restarted and he is diuresing.  His weight however is measured today 5 pounds higher than discharge.  He says he thought the scales were inaccurate the hospital.  He does not measure his weight on a daily basis and I encouraged him to do that.  There was concern for obstructive sleep apnea in the hospital based on oximetry and we will arrange for a formal sleep study.  He does have significant chronic kidney disease with creatinine of 2.6-2.7.  He is followed by Dr. Moshe Cipro.  He will likely need an increase in his diuretics.  He should contact their office for an appointment.  Unfortunately given his elevated creatinine, is not a candidate for an MRI with contrast.  Overall he feels much better than when he was hospitalized.  07/07/2017  John Bailey was again recently hospitalized.  Unfortunately developed sacral decubitus and a fasciitis which required surgery.  He is currently undergoing rehabilitation.  From a cardiac standpoint he has been fairly stable.  His blood pressure is well controlled.  He had no recurrent A. fib although remains on amiodarone 200 mg daily.  He is also on Eliquis 5 mg twice daily.  I do have a concern about keeping him on amiodarone long-term given his young age although his A. fib has been recurrent and difficult to treat.  PMHx:  Past Medical History:  Diagnosis Date  . Atrial flutter with rapid ventricular response ([place]) 08/06/2015   a. s/p DCCV in 07/2015 with recurrent atrial fibrillation and DCCV in 12/2016. Initially successful but noted to be back in atrial fibrillation within 2 weeks of DCCV.   [name] Chronic diastolic (congestive) heart  failure (HCC)    a. EF 50-55% by echo in 06/2016.  . Diabetes ([place])   . Dysrhythmia    Aflutter  . ESRD (end stage renal disease) on dialysis ([place])    "TTS; at [facility]" (06/08/2017)  . Hypertension   . Peripheral vascular disease ([place])   . Rheumatoid arthritis [facility])     Past Surgical History:  Procedure Laterality Date  . AV FISTULA PLACEMENT Left 05/10/2017   Procedure: CREATION of Left Radicephalic Fistula;  Surgeon: Rosetta Posner, MD;  Location: [place];  Service: Vascular;  Laterality: Left;  . CARDIOVERSION N/A 08/01/2015   Procedure: CARDIOVERSION;  Surgeon: Josue Hector, MD;  Location: [facility] ENDOSCOPY;  Service: Cardiovascular;  Laterality: N/A;  . CARDIOVERSION N/A 12/18/2016   Procedure: CARDIOVERSION;  Surgeon: [name], MD;  Location: [facility] ENDOSCOPY;  Service: Cardiovascular;  Laterality: N/A;  . CARDIOVERSION N/A 04/30/2017   Procedure: CARDIOVERSION;  Surgeon: Larey Dresser, MD;  Location: [facility] ENDOSCOPY;  Service: Cardiovascular;  Laterality: N/A;  . CHOLECYSTECTOMY    . DEBRIDMENT OF DECUBITUS ULCER N/A 06/04/2017   Procedure: DEBRIDMENT OF SACRAL DECUBITUS ULCER;  Surgeon: Georganna Skeans, MD;  Location: [place];  Service: General;  Laterality: N/A;  . INCISION AND DRAINAGE ABSCESS N/A 06/14/2017   Procedure: INCISION AND DRAINAGE GLUTEAL ABSCESS;  Surgeon: Kieth Brightly Arta Bruce, MD;  Location: [place];  Service: General;  Laterality: N/A;  Packing to coccyx with packing and drain to right buttock  . INSERTION OF DIALYSIS CATHETER Right 05/04/2017   Procedure: INSERTION OF TUNNELED  DIALYSIS CATHETER;  Surgeon: Conrad Woodlynne, MD;  Location: [place];  Service: Vascular;  Laterality: Right;  . IR FLUORO GUIDE CV LINE RIGHT  04/27/2017  . IR US GUIDE VASC ACCESS RIGHT  04/27/2017  . TEE WITHOUT CARDIOVERSION N/A 08/01/2015   Procedure: TRANSESOPHAGEAL ECHOCARDIOGRAM (TEE);  Surgeon: Josue Hector, MD;  Location[facility] ENDOSCOPY;  Service: Cardiovascular;  Laterality: N/A;    FAMHx:    Family History  Problem Relation Age of Onset  . Heart disease Mother   . Hyperlipidemia Father   . Hypertension Father     SOCHx:   reports that he has never smoked. He has never used smokeless tobacco. He reports that he does not drink alcohol or use drugs.  ALLERGIES:  No Known Allergies  ROS: Pertinent items noted in HPI and remainder of comprehensive ROS otherwise negative.  HOME MEDS: Current Outpatient Medications  Medication Sig Dispense Refill  . amiodarone (PACERONE) 200 MG tablet Take 1 tablet (200 mg total) by mouth daily. 30 tablet 0  . apixaban (ELIQUIS) 5 MG TABS tablet Take 1 tablet (5 mg total) by mouth 2 (two) times daily. 60 tablet 0  . calcitRIOL (ROCALTROL) 0.5 MCG capsule Take 1 capsule (0.5 mcg total) by mouth every Tuesday, Thursday, and Saturday at 6 PM. 90 capsule 0  . carvedilol (COREG) 6.25 MG tablet Take 1 tablet (6.25 mg total) by mouth 2 (two) times daily with a meal. 60 tablet 0  . cefTAZidime 2 g in sodium chloride 0.9 % 100 mL Inject 2 g into the vein Every Tuesday,Thursday,and Saturday with dialysis. Continue ceftazidime for osteomyelitis, sacral wound, antibiotic stop date 07/25/17.  Needs weekly CBC with differential.  Fax weekly labs to [phone]    . collagenase (SANTYL) ointment Apply topically daily.    . diclofenac sodium (VOLTAREN) 1 % GEL Apply 4 g topically 2 (two) times daily as needed (apply over affected area).    . feeding supplement, GLUCERNA SHAKE, (GLUCERNA SHAKE) LIQD Take 237 mLs by mouth 3 (three) times daily between meals.  0  . insulin glargine (LANTUS) 100 UNIT/ML injection Inject 0.15 mLs (15 Units total) into the skin at bedtime.    [name] (FOSRENOL) 500 MG chewable tablet Chew 0.5 tablets (250 mg total) by mouth 3 (three) times daily with meals.    [name] NOVOLOG FLEXPEN 100 UNIT/ML FlexPen Inject as directed as directed.  0  . predniSONE (DELTASONE) 10 MG tablet Take 1 tablet (10 mg total) by mouth daily with breakfast.  Last dose 4/25     No current facility-administered medications for this visit.     LABS/IMAGING: No results found for this or any previous visit (from the past 48 hour(s)). No results found.  WEIGHTS: Wt Readings from Last 3 Encounters:  07/07/17 243 lb (110.2 kg)  07/01/17 243 lb 2.7 oz (110.3 kg)  06/08/17 268 lb 8.3 oz (121.8 kg)    VITALS: BP 102/67   Pulse 89   Ht 5\' 11"  (1.803 m)   Wt 243 lb (110.2 kg)   SpO2 100%  BMI 33.89 kg/m   EXAM: General appearance: alert, no distress and morbidly obese Neck: no carotid bruit and no JVD Lungs: clear to auscultation bilaterally Heart: regular rate and rhythm, S1, S2 normal, no murmur, click, rub or gallop Abdomen: soft, non-tender; bowel sounds normal; no masses,  no organomegaly and obese Extremities: edema 3+ edema - compression stockings in place Pulses: 2+ and symmetric Skin: Skin color, texture, turgor normal. No rashes or lesions Neurologic: Grossly normal Psych: Pleasant  EKG: Deferred  ASSESSMENT: 1. Recent hospitalization for infected decubitus, status post surgery 2. Near syncope,?bradycardia-related 3. Possible OSA 4. Paroxysmal atrial flutter status post TEE cardioversion 5. Nonischemic cardiomyopathy EF 25-35% 6. Chronic anticoagulation on Eliquis - CHADSVASC score of 5 7. Acute kidney injury 8. Type 2 diabetes 9. Dyslipidemia 10. Hypertensive heart disease with heart failure  PLAN: 1.   Mr. Kroeker has maintained sinus rhythm after cardioversion on amiodarone, however I do not feel this is a good long-term option for him.  He does have end-stage renal disease on hemodialysis which may also limit antiarrhythmic therapy.  He has chronic left bundle branch block and diastolic heart failure with volume status managed on dialysis.  He is essentially anuric.  He denies any chest pain.  He has no bleeding problems and was recently switched to Eliquis.  I will arrange for follow-up in the A. fib clinic for  their opinion regarding alternate antiarrhythmic therapy as a long-term option given his comorbidities.  Pixie Casino, MD, [employer], [place] Director of the Advanced Lipid Disorders &  Cardiovascular Risk Reduction Clinic Diplomate of the American Board of Clinical Lipidology Attending Cardiologist  Direct Dial: [phone]  Fax: [phone]  Website:  [email or web address] 07/07/2017, 4:42 PM

## 2017-07-21 ENCOUNTER — Encounter: Payer: BLUE CROSS/BLUE SHIELD | Attending: Internal Medicine | Admitting: Internal Medicine

## 2017-07-21 DIAGNOSIS — L89154 Pressure ulcer of sacral region, stage 4: Secondary | ICD-10-CM | POA: Insufficient documentation

## 2017-07-21 DIAGNOSIS — L89314 Pressure ulcer of right buttock, stage 4: Secondary | ICD-10-CM | POA: Diagnosis not present

## 2017-07-21 DIAGNOSIS — I132 Hypertensive heart and chronic kidney disease with heart failure and with stage 5 chronic kidney disease, or end stage renal disease: Secondary | ICD-10-CM | POA: Diagnosis not present

## 2017-07-21 DIAGNOSIS — N186 End stage renal disease: Secondary | ICD-10-CM | POA: Diagnosis not present

## 2017-07-21 DIAGNOSIS — I509 Heart failure, unspecified: Secondary | ICD-10-CM | POA: Diagnosis not present

## 2017-07-21 DIAGNOSIS — E1122 Type 2 diabetes mellitus with diabetic chronic kidney disease: Secondary | ICD-10-CM | POA: Diagnosis not present

## 2017-07-21 DIAGNOSIS — Z992 Dependence on renal dialysis: Secondary | ICD-10-CM | POA: Insufficient documentation

## 2017-07-21 DIAGNOSIS — Z794 Long term (current) use of insulin: Secondary | ICD-10-CM | POA: Diagnosis not present

## 2017-07-21 DIAGNOSIS — E1142 Type 2 diabetes mellitus with diabetic polyneuropathy: Secondary | ICD-10-CM | POA: Diagnosis not present

## 2017-07-23 NOTE — Progress Notes (Signed)
John Bailey, John Bailey ([id number]) Visit Report for 07/21/2017 Abuse/Suicide Risk Screen Details Patient Name: John Bailey, John Bailey. Date of Service: 07/21/2017 8:00 AM Medical Record Number: [id number] Patient Account Number: [id number] Date of Birth/Sex: Jul 01, 1970 (47 y.o. Male) Treating RN: Ahmed Prima Primary Care Tysen Roesler: Gala Romney Other Clinician: Referring Tamecca Artiga: Janie Morning Treating Anitha Kreiser/Extender: Ricard Dillon Weeks in Treatment: 0 Abuse/Suicide Risk Screen Items Answer ABUSE/SUICIDE RISK SCREEN: Has anyone close to you tried to hurt or harm you recentlyo No Do you feel uncomfortable with anyone in your familyo No Has anyone forced you do things that you didnot want to doo No Do you have any thoughts of harming yourselfo No Patient displays signs or symptoms of abuse and/or neglect. No Electronic Signature(s) Signed: 07/21/2017 4:48:17 PM By: Alric Quan Entered By: Alric Quan on 07/21/2017 08:27:34 John Bailey ([id number]) -------------------------------------------------------------------------------- Activities of Daily Living Details Patient Name: John Bailey, John Bailey. Date of Service: 07/21/2017 8:00 AM Medical Record Number: [id number] Patient Account Number: [id number] Date of Birth/Sex: 06/24/1970 (47 y.o. Male) Treating RN: Ahmed Prima Primary Care Kaydon Husby: Gala Romney Other Clinician: Referring Paul Trettin: Janie Morning Treating Miata Culbreth/Extender: Ricard Dillon Weeks in Treatment: 0 Activities of Daily Living Items Answer Activities of Daily Living (Please select one for each item) Drive Automobile Not Able Take Medications Need Assistance Use Telephone Completely Able Care for Appearance Need Assistance Use Toilet Need Assistance Bath / Shower Need Assistance Dress Self Need Assistance Feed Self Completely Able Walk Need Assistance Get In / Out Bed Need Assistance Housework Not Able Prepare Meals Not Able Handle Money  Not Able Shop for Self Not Able Electronic Signature(s) Signed: 07/21/2017 4:48:17 PM By: Alric Quan Entered By: Alric Quan on 07/21/2017 08:28:20 John Bailey ([id number]) -------------------------------------------------------------------------------- Education Assessment Details Patient Name: John Bailey. Date of Service: 07/21/2017 8:00 AM Medical Record Number: [id number] Patient Account Number: [id number] Date of Birth/Sex: [birth year] (47 y.o. Male) Treating RN: Ahmed Prima Primary Care Adonna Horsley: Gala Romney Other Clinician: Referring Jemmie Ledgerwood: Janie Morning Treating Lujean Ebright/Extender: Tito Dine in Treatment: 0 Primary Learner Assessed: Patient Learning Preferences/Education Level/Primary Language Learning Preference: Explanation, Printed Material Highest Education Level: College or Above Preferred Language: English Cognitive Barrier Assessment/Beliefs Language Barrier: No Translator Needed: No Memory Deficit: No Emotional Barrier: No Cultural/Religious Beliefs Affecting Medical Care: No Physical Barrier Assessment Impaired Vision: Yes Glasses Impaired Hearing: No Decreased Hand dexterity: No Knowledge/Comprehension Assessment Knowledge Level: Medium Comprehension Level: Medium Ability to understand written Medium instructions: Ability to understand verbal Medium instructions: Motivation Assessment Anxiety Level: Calm Cooperation: Cooperative Education Importance: Acknowledges Need Interest in Health Problems: Asks Questions Perception: Coherent Willingness to Engage in Self- Medium Management Activities: Readiness to Engage in Self- Medium Management Activities: Electronic Signature(s) Signed: 07/21/2017 4:48:17 PM By: Alric Quan Entered By: Alric Quan on 07/21/2017 08:28:47 John Bailey ([id number]) -------------------------------------------------------------------------------- Fall Risk Assessment  Details Patient Name: John Bailey. Date of Service: 07/21/2017 8:00 AM Medical Record Number: [id number] Patient Account Number: [id number] Date of Birth/Sex: 08-Jan-1971 (47 y.o. Male) Treating RN: Carolyne Fiscal, Debi Primary Care Monserrat Vidaurri: Gala Romney Other Clinician: Referring Corra Kaine: Janie Morning Treating Kamdin Follett/Extender: Tito Dine in Treatment: 0 Fall Risk Assessment Items Have you had 2 or more falls in the last 12 monthso 0 No Have you had any fall that resulted in injury in the last 12 monthso 0 No FALL RISK ASSESSMENT: History of falling - immediate or within 3 months 0 No Secondary diagnosis 15 Yes Ambulatory aid None/bed rest/wheelchair/nurse 0 Yes Crutches/cane/walker 15  Yes Furniture 0 No IV Access/Saline Lock 0 No Gait/Training Normal/bed rest/immobile 0 No Weak 10 Yes Impaired 20 Yes Mental Status Oriented to own ability 0 Yes Electronic Signature(s) Signed: 07/21/2017 4:48:17 PM By: Alric Quan Entered By: Alric Quan on 07/21/2017 08:29:20 John Bailey ([id number]) -------------------------------------------------------------------------------- Foot Assessment Details Patient Name: John Bailey. Date of Service: 07/21/2017 8:00 AM Medical Record Number: [id number] Patient Account Number: [id number] Date of Birth/Sex: August 18, 1970 (47 y.o. Male) Treating RN: Ahmed Prima Primary Care Gaylon Bentz: Gala Romney Other Clinician: Referring Karlisha Mathena: Janie Morning Treating Bradrick Kamau/Extender: Ricard Dillon Weeks in Treatment: 0 Foot Assessment Items Site Locations + = Sensation present, - = Sensation absent, C = Callus, U = Ulcer R = Redness, W = Warmth, M = Maceration, PU = Pre-ulcerative lesion F = Fissure, S = Swelling, D = Dryness Assessment Right: Left: Other Deformity: No No Prior Foot Ulcer: No No Prior Amputation: No No Charcot Joint: No No Ambulatory Status: Gait: Notes wounds on sacral area Electronic  Signature(s) Signed: 07/21/2017 4:48:17 PM By: Alric Quan Entered By: Alric Quan on 07/21/2017 08:30:05 John Bailey ([id number]) -------------------------------------------------------------------------------- Nutrition Risk Assessment Details Patient Name: John Bailey. Date of Service: 07/21/2017 8:00 AM Medical Record Number: [id number] Patient Account Number: [id number] Date of Birth/Sex: 19-Feb-1971 (46 y.o. Male) Treating RN: Carolyne Fiscal, Debi Primary Care Shanora Christensen: Gala Romney Other Clinician: Referring Luvern Mcisaac: Janie Morning Treating Marque Bango/Extender: Ricard Dillon Weeks in Treatment: 0 Height (in): 71 Weight (lbs): 263 Body Mass Index (BMI): 36.7 Nutrition Risk Assessment Items NUTRITION RISK SCREEN: I have an illness or condition that made me change the kind and/or amount of 2 Yes food I eat I eat fewer than two meals per day 0 No I eat few fruits and vegetables, or milk products 0 No I have three or more drinks of beer, liquor or wine almost every day 0 No I have tooth or mouth problems that make it hard for me to eat 0 No I don't always have enough money to buy the food I need 0 No I eat alone most of the time 0 No I take three or more different prescribed or over-the-counter drugs a day 1 Yes Without wanting to, I have lost or gained 10 pounds in the last six months 0 No I am not always physically able to shop, cook and/or feed myself 0 No Nutrition Protocols Good Risk Protocol Moderate Risk Protocol Electronic Signature(s) Signed: 07/21/2017 4:48:17 PM By: Alric Quan Entered By: Alric Quan on 07/21/2017 08:29:46

## 2017-07-24 NOTE — Progress Notes (Signed)
John, Bailey ([id number]) Visit Report for 07/21/2017 Chief Complaint Document Details Patient Name: John Bailey, John Bailey. Date of Service: 07/21/2017 8:00 AM Medical Record Number: [id number] Patient Account Number: [id number] Date of Birth/Sex: 01/28/1971 (47 y.o. Male) Treating RN: Cornell Barman Primary Care Provider: Gala Romney Other Clinician: Referring Provider: Janie Morning Treating Provider/Extender: Ricard Dillon Weeks in Treatment: 0 Information Obtained from: Patient Chief Complaint 07/21/17; patient is here for review of pressure ulcerations on the sacrum right buttock Electronic Signature(s) Signed: 07/22/2017 1:16:01 PM By: Linton Ham MD Entered By: Linton Ham on 07/21/2017 09:37:01 John Bailey ([id number]) -------------------------------------------------------------------------------- HPI Details Patient Name: John Bailey. Date of Service: 07/21/2017 8:00 AM Medical Record Number: [id number] Patient Account Number: [id number] Date of Birth/Sex: 12-19-1970 (47 y.o. Male) Treating RN: Cornell Barman Primary Care Provider: Gala Romney Other Clinician: Referring Provider: Janie Morning Treating Provider/Extender: Ricard Dillon Weeks in Treatment: 0 History of Present Illness HPI Description: 07/21/17; this is a 47 year old patient I actually know from a stay in our [facility] clinic ending earlier this year. At the time I had looked after him largely for a wound on the left lateral leg in the setting of lymphedema, chronic renal insufficiency and congestive heart failure.we did not really control his swelling and had trouble with the wound closure because of this. Ultimately the patient stopped coming on 05/19/30 I'm not certain whether he was healed at that point or not. In any case he was admitted to hospital on 05/13/17 with acute congestive heart failure, radial renal syndrome. He had dialysis initiated. At that point in time he was noted to have a  pressure ulcer in the sacrum. He had surgical debridements on this area on 3/22 and 4/1 including an IandD of a right gluteal abscess. He was readmitted to hospital from 3/26 through 4/21 with an infected sacral decubitus with A. fib/flutter and sacral osteomyelitis although I don't really see the documentation of the osteomyelitis. The CT scan done of the lumbar spine did not show this although it did not really look at the area specifically. A culture of something on 06/14/17 showed Morganella morganii. He has been on [place] at dialysis since then scheduled and on 07/25/17. He is currently at [facility] skilled facility. They're using Dakin's wet to dry change daily. Per the patient and his brother there is been a remarkable improvement in the condition of the wound surface. 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 fib/flutter possibly on Eloquis Electronic Signature(s) Signed: 07/22/2017 1:16:01 PM By: Linton Ham MD Entered By: Linton Ham on 07/21/2017 09:41:18 John Bailey ([id number]) -------------------------------------------------------------------------------- Physical Exam Details Patient Name: John Bailey. Date of Service: 07/21/2017 8:00 AM Medical Record Number: [id number] Patient Account Number: [id number] Date of Birth/Sex: [birth year] (47 y.o. Male) Treating RN: Cornell Barman Primary Care Provider: Gala Romney Other Clinician: Referring Provider: Janie Morning Treating Provider/Extender: Ricard Dillon Weeks in Treatment: 0 Constitutional Sitting or standing Blood Pressure is within target range for  patient.. Pulse regular and within target range for patient.[name] Respirations regular, non-labored and within target range.. Temperature is normal and within the target range for the patient.[name] appears in no  distress. Eyes Conjunctivae clear. No discharge. Respiratory Respiratory effort is easy and symmetric bilaterally. Rate is normal at rest and on room air.. shallow air entry but no crackles or wheezes. Cardiovascular no S3 no murmurs. remedies have dramatically less edema than I remember when he was in [place]. Gastrointestinal (GI) Abdomen is soft and non-distended without masses or tenderness. Bowel sounds active in all quadrants.. Lymphatic . Musculoskeletal patient still has mild active synovitis in the metacarpophalangeal joints of both hands. Integumentary (Hair, Skin) patient has chronic venous insufficiency in his legs but the amount of edema is considerably less since initiation of dialysis. Psychiatric No evidence of depression, anxiety, or agitation. Calm, cooperative, and communicative. Appropriate interactions and affect.. Notes 07/21/17; the patient has 3 wounds all pressure areas #1 a large stage IV wound on the right ischial tuberosity. This is teardrop in shape with tunneling at 1:00 and 4:00. The deeper tunnel is at 4:00 but I don't have any palpable bone. #2 a small wound on the lower right buttock. This is a small stage III ulcer at this point. #3 over the sacrum is again a teardrop shaped wound with a tunnel inferiorly at 4:00. there are 2 small areas of exposed bone in this wound but no surrounding soft tissue infection or crepitus #4 all the wound surfaces are satisfactory.healthy granulation. I see no evidence of infection at this time Electronic Signature(s) Signed: 07/22/2017 1:16:01 PM By: Linton Ham MD Entered By: Linton Ham on 07/21/2017 09:44:47 John Bailey  ([id number]) -------------------------------------------------------------------------------- Physician Orders Details Patient Name: John Bailey Date of Service: 07/21/2017 8:00 AM Medical Record Number: [id number] Patient Account Number: [id number] Date of Birth/Sex: 1970-04-04 (47 y.o. Male) Treating RN: Cornell Barman Primary Care Provider: Gala Romney Other Clinician: Referring Provider: Janie Morning Treating Provider/Extender: Tito Dine in Treatment: 0 Verbal / Phone Orders: No Diagnosis Coding Wound Cleansing Wound #1 Sacrum o Clean wound with Normal Saline. Wound #2 Right,Proximal Gluteus o Clean wound with Normal Saline. Wound #3 Right,Distal Gluteus o Clean wound with Normal Saline. Primary Wound Dressing Wound #1 Sacrum o Other: - Continue Dakins wet to dry dressings Wound #3 Right,Distal Gluteus o Other: - Continue Dakins wet to dry dressings Wound #2 Right,Proximal Gluteus o Silver Collagen Secondary Dressing Wound #1 Sacrum o ABD pad Wound #3 Right,Distal Gluteus o ABD pad Wound #2 Right,Proximal Gluteus o Boardered Foam Dressing Dressing Change Frequency Wound #1 Sacrum o Change dressing every day. Wound #3 Right,Distal Gluteus o Change dressing every day. Wound #2 Right,Proximal Gluteus o Change dressing every other day. Follow-up Appointments Wound #1 Sacrum o Return Appointment in 1 week. BRAHM, BARBEAU S. ([id number]) Wound #2 Right,Proximal Gluteus o Return Appointment in 1 week. Wound #3 Right,Distal Gluteus o Return Appointment in 1 week. Off-Loading Wound #1 Sacrum o Turn and reposition every 2 hours - Keep pressure off of wounded areas Wound #2 Right,Proximal Gluteus o Turn and reposition every 2 hours - Keep pressure off of wounded areas Wound #3 Right,Distal Gluteus o Turn and reposition every 2 hours - Keep pressure off of wounded areas Negative Pressure Wound Therapy Wound #1  Sacrum o Wound VAC settings at 125/130 mmHg continuous pressure. Use BLACK/GREEN foam to wound cavity. Use WHITE foam to fill any tunnel/s and/or undermining. Change VAC dressing 2 X WEEK. Change canister as indicated when full. Nurse may titrate settings and frequency of dressing changes as clinically indicated. Miqu[facility] to order wound vac for patient. o Home Health Nurse may d/c VAC  for s/s of increased infection, significant wound regression, or uncontrolled drainage. [place] at [phone]. o Apply contact layer over base of wound. - Contact layer and Collagen over bone Wound #3 Right,Distal Gluteus o Wound VAC settings at 125/130 mmHg continuous pressure. Use BLACK/GREEN foam to wound cavity. Use WHITE foam to fill any tunnel/s and/or undermining. Change VAC dressing 2 X WEEK. Change canister as indicated when full. Nurse may titrate settings and frequency of dressing changes as clinically indicated. Miqu[facility] to order wound vac for patient. o Home Health Nurse may d/c VAC for s/s of increased infection, significant wound regression, or uncontrolled drainage. [place] at [phone]. o Apply contact layer over base of wound. - Contact layer and Collagen over bone Electronic Signature(s) Signed: 07/21/2017 5:47:31 PM By: Gretta Cool, BSN, RN, CWS, Kim RN, BSN Signed: 07/22/2017 1:16:01 PM By: Linton Ham MD Entered By: Gretta Cool, BSN, RN, CWS, Kim on 07/21/2017 09:11:08 JOSTEN, WARMUTH ([id number]) -------------------------------------------------------------------------------- Problem List Details Patient Name: TREMAYNE, [name]. Date of Service: 07/21/2017 8:00 AM Medical Record Number: [id number] Patient Account Number: [id number] Date of Birth/Sex: Jan 10, 1971 (47 y.o. Male) Treating RN: Cornell Barman Primary Care Provider: Gala Romney Other Clinician: Referring Provider: Janie Morning Treating Provider/Extender: Ricard Dillon Weeks in Treatment: 0 Active Problems ICD-10 Impacting Encounter Code Description Active Date Wound Healing Diagnosis L89.154 Pressure ulcer of sacral region, stage 4 07/21/2017 Yes L89.304 Pressure ulcer of unspecified buttock, stage 4 07/21/2017 Yes L89.303 Pressure ulcer of unspecified buttock, stage 3 07/21/2017 Yes Inactive Problems Resolved Problems Electronic Signature(s) Signed: 07/22/2017 1:16:01 PM By: Linton Ham MD Entered By: Linton Ham on 07/21/2017 09:35:41 Mella, Michail Sermon ([id number]) -------------------------------------------------------------------------------- Progress Note Details Patient Name: John Bailey. Date of Service: 07/21/2017 8:00 AM Medical Record Number: [id number] Patient Account Number: [id number] Date of Birth/Sex: [birth year] (47 y.o. Male) Treating RN: Cornell Barman Primary Care Provider: Gala Romney Other Clinician: Referring Provider: Janie Morning Treating Provider/Extender: Ricard Dillon Weeks in Treatment: 0 Subjective Chief Complaint Information obtained from Patient 07/21/17; patient is here for review of pressure ulcerations on the sacrum right buttock History of Present Illness (HPI) 07/21/17; this is a 47 year old patient I actually know from a stay in our [facility] clinic ending earlier this year. At the time I had looked after him largely for a wound on the left lateral leg in the setting of lymphedema, chronic renal insufficiency and congestive heart failure.we did not really control his swelling and had trouble with the wound closure because of this. Ultimately the patient stopped coming on 8/0/16 I'm not certain whether he was healed at that point or not. In any case he was admitted to hospital on 05/13/17 with acute congestive heart failure, radial renal syndrome. He had dialysis initiated. At that point in time he was noted to have a pressure ulcer in the sacrum. He had surgical debridements on this area on 3/22 and  4/1 including an IandD of a right gluteal abscess. He was readmitted to hospital from 3/26 through 4/21 with an infected sacral decubitus with A. fib/flutter and sacral osteomyelitis although I don't really see the documentation of the osteomyelitis. The CT scan done of the lumbar spine did not show this although it did not really look at the area specifically. A culture of something on 06/14/17 showed Morganella morganii. He has been on [place] at dialysis since then scheduled and on 07/25/17. He is currently at [facility] skilled facility. They're using Dakin's wet to dry  change daily. Per the patient and his brother there is been a remarkable improvement in the condition of the wound surface. 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 fib/flutter possibly on Eloquis Wound History Patient presents with 1 open wound that has been present for approximately 1 month. Patient has been treating wound in the following manner: dakins. Laboratory tests have not been performed in the last month. Patient reportedly has not tested positive for an antibiotic resistant organism. Patient reportedly has not tested positive for osteomyelitis. Patient History Information obtained from Patient. Allergies NKDA Family History Diabetes - Father, Heart Disease - Mother, Hypertension - Father, Kidney Disease - Father, No family history of Cancer, Hereditary Spherocytosis, Lung Disease, Seizures, Stroke, Thyroid Problems, Tuberculosis. Social History Never smoker, Marital Status - Married, Alcohol Use - Never, Drug Use - No History, Caffeine Use - Never. Medical  History Cardiovascular MOLLY, MASELLI ([id number]) Patient has history of Arrhythmia - a-fib, a- flutter, Congestive Heart Failure, Hypertension, Peripheral Venous Disease Endocrine Patient has history of Type II Diabetes Genitourinary Patient has history of End Stage Renal Disease Integumentary (Skin) Patient has history of History of pressure wounds Musculoskeletal Patient has history of Rheumatoid Arthritis Neurologic Patient has history of Neuropathy Patient is treated with Insulin. Review of Systems (ROS) Constitutional Symptoms (General Health) The patient has no complaints or symptoms. Eyes Complains or has symptoms of Glasses / Contacts. Ear/Nose/Mouth/Throat The patient has no complaints or symptoms. Hematologic/Lymphatic The patient has no complaints or symptoms. Respiratory The patient has no complaints or symptoms. Gastrointestinal The patient has no complaints or symptoms. Endocrine Denies complaints or symptoms of Thyroid disease. Genitourinary Complains or has symptoms of Kidney failure/ Dialysis. Oncologic The patient has no complaints or symptoms. Psychiatric The patient has no complaints or symptoms. Objective Constitutional Sitting or standing Blood Pressure is within target range for patient.. Pulse regular and within target range for patient.[name] Respirations regular, non-labored and within target range.. Temperature is normal and within the target range for the patient.[name] appears in no distress. Vitals Time Taken: 8:17 AM, Height: 71 in, Source: Stated, Weight: 263 lbs, Source: Stated, BMI: 36.7, Temperature: 98.0 F, Pulse: 82 bpm, Respiratory Rate: 18 breaths/min, Blood Pressure: 115/80 mmHg. Eyes Conjunctivae clear. No discharge. Respiratory Parish, Joshwa S. ([id number]) Respiratory effort is easy and symmetric bilaterally. Rate is normal at rest and on room air.. shallow air entry but no crackles or wheezes. Cardiovascular no S3 no murmurs.  remedies have dramatically less edema than I remember when he was in [place]. Gastrointestinal (GI) Abdomen is soft and non-distended without masses or tenderness. Bowel sounds active in all quadrants.. Musculoskeletal patient still has mild active synovitis in the metacarpophalangeal joints of both hands. Psychiatric No evidence of depression, anxiety, or agitation. Calm, cooperative, and communicative. Appropriate interactions and affect.. General Notes: 07/21/17; the patient has 3 wounds all pressure areas #1 a large stage IV wound on the right ischial tuberosity. This is teardrop in shape with tunneling at 1:00 and 4:00. The deeper tunnel is at 4:00 but I don't have any palpable bone. #2 a small wound on the lower right buttock. This is a small stage III ulcer at  this point. #3 over the sacrum is again a teardrop shaped wound with a tunnel inferiorly at 4:00. there are 2 small areas of exposed bone in this wound but no surrounding soft tissue infection or crepitus #4 all the wound surfaces are satisfactory.healthy granulation. I see no evidence of infection at this time Integumentary (Hair, Skin) patient has chronic venous insufficiency in his legs but the amount of edema is considerably less since initiation of dialysis. Wound #1 status is Open. Original cause of wound was Pressure Injury. The wound is located on the Sacrum. The wound measures 6.5cm length x 4.2cm width x 5cm depth; 21.441cm^2 area and 107.207cm^3 volume. There is bone, muscle, Fat Layer (Subcutaneous Tissue) Exposed, and fascia exposed. There is no tunneling noted, however, there is undermining starting at 11:00 and ending at 3:00 with a maximum distance of 3cm. There is a large amount of serosanguineous drainage noted. The wound margin is distinct with the outline attached to the wound base. There is large (67-100%) red granulation within the wound bed. There is a small (1-33%) amount of necrotic tissue within the wound  bed including Adherent Slough. Periwound temperature was noted as No Abnormality. The periwound has tenderness on palpation. Wound #2 status is Open. Original cause of wound was Pressure Injury. The wound is located on the Right,Proximal Gluteus. The wound measures 10.5cm length x 4.5cm width x 3.5cm depth; 37.11cm^2 area and 129.885cm^3 volume. There is muscle, Fat Layer (Subcutaneous Tissue) Exposed, and fascia exposed. There is no undermining noted, however, there is tunneling at 5:00 with a maximum distance of 7cm. There is a large amount of serosanguineous drainage noted. The wound margin is distinct with the outline attached to the wound base. There is large (67-100%) red granulation within the wound bed. Periwound temperature was noted as No Abnormality. The periwound has tenderness on palpation. Wound #3 status is Open. Original cause of wound was Pressure Injury. The wound is located on the Right,Distal Gluteus. The wound measures 1cm length x 1.5cm width x 0.6cm depth; 1.178cm^2 area and 0.707cm^3 volume. There is Fat Layer (Subcutaneous Tissue) Exposed exposed. There is no tunneling or undermining noted. There is a large amount of serosanguineous drainage noted. The wound margin is distinct with the outline attached to the wound base. There is large (67-100%) red granulation within the wound bed. There is a small (1-33%) amount of necrotic tissue within the wound bed including Adherent Slough. The periwound skin appearance did not exhibit: Callus, Crepitus, Excoriation, Induration, Rash, Scarring, Dry/Scaly, Maceration, Atrophie Blanche, Cyanosis, Ecchymosis, Hemosiderin Staining, Mottled, Pallor, Rubor, Erythema. Periwound temperature was noted as No Abnormality. The periwound has tenderness on palpation. Assessment Active Problems ICD-10 L89.154 - Pressure ulcer of sacral region, stage 4 Ackerman, Nashawn S. ([id number]) L89.304 - Pressure ulcer of unspecified buttock, stage 4 L89.303  - Pressure ulcer of unspecified buttock, stage 3 Plan Wound Cleansing: Wound #1 Sacrum: Clean wound with Normal Saline. Wound #2 Right,Proximal Gluteus: Clean wound with Normal Saline. Wound #3 Right,Distal Gluteus: Clean wound with Normal Saline. Primary Wound Dressing: Wound #1 Sacrum: Other: - Continue Dakins wet to dry dressings Wound #3 Right,Distal Gluteus: Other: - Continue Dakins wet to dry dressings Wound #2 Right,Proximal Gluteus: Silver Collagen Secondary Dressing: Wound #1 Sacrum: ABD pad Wound #3 Right,Distal Gluteus: ABD pad Wound #2 Right,Proximal Gluteus: Boardered Foam Dressing Dressing Change Frequency: Wound #1 Sacrum: Change dressing every day. Wound #3 Right,Distal Gluteus: Change dressing every day. Wound #2 Right,Proximal Gluteus: Change dressing every other day. Follow-up Appointments:  Wound #1 Sacrum: Return Appointment in 1 week. Wound #2 Right,Proximal Gluteus: Return Appointment in 1 week. Wound #3 Right,Distal Gluteus: Return Appointment in 1 week. Off-Loading: Wound #1 Sacrum: Turn and reposition every 2 hours - Keep pressure off of wounded areas Wound #2 Right,Proximal Gluteus: Turn and reposition every 2 hours - Keep pressure off of wounded areas Wound #3 Right,Distal Gluteus: Turn and reposition every 2 hours - Keep pressure off of wounded areas Negative Pressure Wound Therapy: Wound #1 Sacrum: Wound VAC settings at 125/130 mmHg continuous pressure. Use BLACK/GREEN foam to wound cavity. Use WHITE foam to fill any tunnel/s and/or undermining. Change VAC dressing 2 X WEEK. Change canister as indicated when full. Nurse may titrate settings and frequency of dressing changes as clinically indicated. [facility] to order wound vac for patient. Home Health Nurse may d/c VAC for s/s of increased infection, significant wound regression, or uncontrolled drainage. [place] at [phone]. CANDELARIO, STEPPE S.  ([id number]) Apply contact layer over base of wound. - Contact layer and Collagen over bone Wound #3 Right,Distal Gluteus: Wound VAC settings at 125/130 mmHg continuous pressure. Use BLACK/GREEN foam to wound cavity. Use WHITE foam to fill any tunnel/s and/or undermining. Change VAC dressing 2 X WEEK. Change canister as indicated when full. Nurse may titrate settings and frequency of dressing changes as clinically indicated. Miqu[facility] Place to order wound vac for patient. Home Health Nurse may d/c VAC for s/s of increased infection, significant wound regression, or uncontrolled drainage. [place] at [phone]. Apply contact layer over base of wound. - Contact layer and Collagen over bone #1 for today I continued to Dakin's wet to dry #2 I certainly agree that this patient needs a wound VAC with white foam specific attention to the tunneling #3 collagen over the area of exposed bone in the stage IV wound on the sacral area #4 the small wound on the right buttock/right posterior thigh collagen and border foam #5 the patient has an air mattress at the facility and apparently one at home. He has a pressure relief surface on his wheelchair but he doesn't plan to be in a wheelchair when he leaves. Much of the reason for his immobility is uncontrolled rheumatoid arthritis going according to the patient. Electronic Signature(s) Signed: 07/22/2017 1:16:01 PM By: Linton Ham MD Entered By: Linton Ham on 07/21/2017 09:46:14 John Bailey ([id number]) -------------------------------------------------------------------------------- ROS/PFSH Details Patient Name: John Bailey Date of Service: 07/21/2017 8:00 AM Medical Record Number: [id number] Patient Account Number: [id number] Date of Birth/Sex: 1970-10-13 (47 y.o. Male) Treating RN: Carolyne Fiscal, Debi Primary Care Provider: Gala Romney Other Clinician: Referring Provider: Janie Morning Treating Provider/Extender:  Ricard Dillon Weeks in Treatment: 0 Information Obtained From Patient Wound History Do you currently have one or more open woundso Yes How many open wounds do you currently haveo 1 Approximately how long have you had your woundso 1 month How have you been treating your wound(s) until nowo dakins Has your wound(s) ever healed and then re-openedo No Have you had any lab work done in the past montho No Have you tested positive for an antibiotic resistant organism (MRSA, VRE)o No Have you tested positive for osteomyelitis (bone infection)o No Eyes Complaints and Symptoms: Positive for: Glasses / Contacts Endocrine Complaints and Symptoms: Negative for: Thyroid disease Medical History: Positive for: Type II Diabetes Time with diabetes: 8 yrs Treated with: Insulin Genitourinary Complaints and Symptoms: Positive for: Kidney failure/ Dialysis Medical History: Positive for: End Stage  Renal Disease Constitutional Symptoms (General Health) Complaints and Symptoms: No Complaints or Symptoms Ear/Nose/Mouth/Throat Complaints and Symptoms: No Complaints or Symptoms Hematologic/Lymphatic JARON, CZARNECKI. ([id number]) Complaints and Symptoms: No Complaints or Symptoms Respiratory Complaints and Symptoms: No Complaints or Symptoms Cardiovascular Medical History: Positive for: Arrhythmia - a-fib, a- flutter; Congestive Heart Failure; Hypertension; Peripheral Venous Disease Gastrointestinal Complaints and Symptoms: No Complaints or Symptoms Integumentary (Skin) Medical History: Positive for: History of pressure wounds Musculoskeletal Medical History: Positive for: Rheumatoid Arthritis Neurologic Medical History: Positive for: Neuropathy Oncologic Complaints and Symptoms: No Complaints or Symptoms Psychiatric Complaints and Symptoms: No Complaints or Symptoms Immunizations Pneumococcal Vaccine: Received Pneumococcal Vaccination: Yes Implantable Devices Family and  Social History Cancer: No; Diabetes: Yes - Father; Heart Disease: Yes - Mother; Hereditary Spherocytosis: No; Hypertension: Yes - Father; Kidney Disease: Yes - Father; Lung Disease: No; Seizures: No; Stroke: No; Thyroid Problems: No; Tuberculosis: No; Never smoker; Marital Status - Married; Alcohol Use: Never; Drug Use: No History; Caffeine Use: Never; Financial Concerns: No; Food, Clothing or Shelter Needs: No; Support System Lacking: No; Transportation Concerns: No; Advanced Directives: No; Patient does not want information on Advanced Directives; Do not resuscitate: No; Living Will: No; Medical Power of Attorney: No Electronic Signature(s) TELVIN, REINDERS ([id number]) Signed: 07/21/2017 4:48:17 PM By: Alric Quan Signed: 07/22/2017 1:16:01 PM By: Linton Ham MD Entered By: Alric Quan on 07/21/2017 08:27:28 John Bailey ([id number]) -------------------------------------------------------------------------------- [place] Details Patient Name: John Bailey. Date of Service: 07/21/2017 Medical Record Number: [id number] Patient Account Number: [id number] Date of Birth/Sex: [birth year] (47 y.o. Male) Treating RN: Cornell Barman Primary Care Provider: Gala Romney Other Clinician: Referring Provider: Janie Morning Treating Provider/Extender: Ricard Dillon Weeks in Treatment: 0 Diagnosis Coding ICD-10 Codes Code Description L89.154 Pressure ulcer of sacral region, stage 4 L89.304 Pressure ulcer of unspecified buttock, stage 4 L89.303 Pressure ulcer of unspecified buttock, stage 3 Facility Procedures CPT4 Code: [id number] Description: [id number] - WOUND CARE VISIT-LEV 5 EST PT Modifier: Quantity: 1 Physician Procedures CPT4 Code: [id number] Description: 67619 - WC PHYS LEVEL 4 - EST PT ICD-10 Diagnosis Description L89.154 Pressure ulcer of sacral region, stage 4 L89.304 Pressure ulcer of unspecified buttock, stage 4 L89.303 Pressure ulcer of unspecified buttock, stage  3 Modifier: Quantity: 1 Electronic Signature(s) Signed: 07/22/2017 1:16:01 PM By: Linton Ham MD Entered By: Linton Ham on 07/21/2017 09:46:45

## 2017-07-24 NOTE — Progress Notes (Signed)
TENOCH, MCCLURE ([id number]) Visit Report for 07/21/2017 Allergy List Details Patient Name: John Bailey, John Bailey. Date of Service: 07/21/2017 8:00 AM Medical Record Number: [id number] Patient Account Number: [id number] Date of Birth/Sex: Feb 26, 1971 (47 y.o. Male) Treating RN: Ahmed Prima Primary Care Lynnzie Blackson: Gala Romney Other Clinician: Referring Saida Lonon: Janie Morning Treating Eriberto Felch/Extender: Ricard Dillon Weeks in Treatment: 0 Allergies Active Allergies NKDA Allergy Notes Electronic Signature(s) Signed: 07/21/2017 4:48:17 PM By: Alric Quan Entered By: Alric Quan on 07/21/2017 08:18:21 John Bailey ([id number]) -------------------------------------------------------------------------------- Arrival Information Details Patient Name: John Bailey Date of Service: 07/21/2017 8:00 AM Medical Record Number: [id number] Patient Account Number: [id number] Date of Birth/Sex: 12/06/1970 (47 y.o. Male) Treating RN: Ahmed Prima Primary Care Saray Capasso: Gala Romney Other Clinician: Referring Sung Parodi: Janie Morning Treating Arnetia Bronk/Extender: Tito Dine in Treatment: 0 Visit Information Patient Arrived: Wheel Chair Arrival Time: 08:14 Accompanied By: brother Transfer Assistance: EasyPivot Patient Lift Patient Identification Verified: Yes Secondary Verification Process Yes Completed: Patient Requires Transmission-Based No Precautions: Patient Has Alerts: Yes Patient Alerts: Patient on Blood Thinner DM II Eliquis Electronic Signature(s) Signed: 07/21/2017 4:48:17 PM By: Alric Quan Entered By: Alric Quan on 07/21/2017 08:20:27 John Bailey ([id number]) -------------------------------------------------------------------------------- Clinic Level of Care Assessment Details Patient Name: John Bailey. Date of Service: 07/21/2017 8:00 AM Medical Record Number: [id number] Patient Account Number: [id number] Date of  Birth/Sex: Nov 16, 1970 (47 y.o. Male) Treating RN: Cornell Barman Primary Care Edilia Ghuman: Gala Romney Other Clinician: Referring Lousie Calico: Janie Morning Treating Reda Gettis/Extender: Tito Dine in Treatment: 0 Clinic Level of Care Assessment Items TOOL 2 Quantity Score []  - Use when only an EandM is performed on the INITIAL visit 0 ASSESSMENTS - Nursing Assessment / Reassessment X - General Physical Exam (combine w/ comprehensive assessment (listed just below) when 1 20 performed on new pt. evals) X- 1 25 Comprehensive Assessment (HX, ROS, Risk Assessments, Wounds Hx, etc.) ASSESSMENTS - Wound and Skin Assessment / Reassessment []  - Simple Wound Assessment / Reassessment - one wound 0 X- 3 5 Complex Wound Assessment / Reassessment - multiple wounds []  - 0 Dermatologic / Skin Assessment (not related to wound area) ASSESSMENTS - Ostomy and/or Continence Assessment and Care []  - Incontinence Assessment and Management 0 []  - 0 Ostomy Care Assessment and Management (repouching, etc.) PROCESS - Coordination of Care []  - Simple Patient / Family Education for ongoing care 0 X- 1 20 Complex (extensive) Patient / Family Education for ongoing care X- 1 10 Staff obtains Programmer, systems, Records, Test Results / Process Orders X- 1 10 Staff telephones HHA, Nursing Homes / Clarify orders / etc []  - 0 Routine Transfer to another Facility (non-emergent condition) []  - 0 Routine Hospital Admission (non-emergent condition) X- 1 15 New Admissions / Biomedical engineer / Ordering NPWT, Apligraf, etc. []  - 0 Emergency Hospital Admission (emergent condition) X- 1 10 Simple Discharge Coordination []  - 0 Complex (extensive) Discharge Coordination PROCESS - Special Needs []  - Pediatric / Minor Patient Management 0 []  - 0 Isolation Patient Management John Bailey, John Bailey. ([id number]) []  - 0 Hearing / Language / Visual special needs []  - 0 Assessment of Community assistance  (transportation, D/C planning, etc.) []  - 0 Additional assistance / Altered mentation []  - 0 Support Surface(s) Assessment (bed, cushion, seat, etc.) INTERVENTIONS - Wound Cleansing / Measurement X - Wound Imaging (photographs - any number of wounds) 1 5 []  - 0 Wound Tracing (instead of photographs) []  - 0 Simple Wound Measurement - one wound X- 3 5 Complex Wound Measurement -  multiple wounds []  - 0 Simple Wound Cleansing - one wound X- 3 5 Complex Wound Cleansing - multiple wounds INTERVENTIONS - Wound Dressings []  - Small Wound Dressing one or multiple wounds 0 X- 1 15 Medium Wound Dressing one or multiple wounds X- 2 20 Large Wound Dressing one or multiple wounds []  - 0 Application of Medications - injection INTERVENTIONS - Miscellaneous []  - External ear exam 0 []  - 0 Specimen Collection (cultures, biopsies, blood, body fluids, etc.) []  - 0 Specimen(s) / Culture(s) sent or taken to Lab for analysis []  - 0 Patient Transfer (multiple staff / Civil Service fast streamer / Similar devices) []  - 0 Simple Staple / Suture removal (25 or less) []  - 0 Complex Staple / Suture removal (26 or more) []  - 0 Hypo / Hyperglycemic Management (close monitor of Blood Glucose) []  - 0 Ankle / Brachial Index (ABI) - do not check if billed separately Has the patient been seen at the hospital within the last three years: Yes Total Score: 215 Level Of Care: New/Established - Level 5 Electronic Signature(s) Signed: 07/21/2017 5:47:31 PM By: Gretta Cool, BSN, RN, CWS, Kim RN, BSN Entered By: Gretta Cool, BSN, RN, CWS, Kim on 07/21/2017 09:15:57 John Bailey ([id number]) -------------------------------------------------------------------------------- Encounter Discharge Information Details Patient Name: John Bailey. Date of Service: 07/21/2017 8:00 AM Medical Record Number: [id number] Patient Account Number: [id number] Date of Birth/Sex: Sep 16, 1970 (47 y.o. Male) Treating RN: Montey Hora Primary Care  Jentry Mcqueary: Gala Romney Other Clinician: Referring Keirstin Musil: Janie Morning Treating Courtland Reas/Extender: Tito Dine in Treatment: 0 Encounter Discharge Information Items Discharge Condition: Stable Ambulatory Status: Wheelchair Discharge Destination: Skilled Nursing Facility Telephoned: No Orders Sent: Yes Transportation: Private Auto Accompanied By: brother Schedule Follow-up Appointment: Yes Clinical Summary of Care: Electronic Signature(s) Signed: 07/21/2017 9:48:17 AM By: Montey Hora Entered By: Montey Hora on 07/21/2017 09:48:17 John Bailey ([id number]) -------------------------------------------------------------------------------- Lower Extremity Assessment Details Patient Name: John Bailey. Date of Service: 07/21/2017 8:00 AM Medical Record Number: [id number] Patient Account Number: [id number] Date of Birth/Sex: February 18, 1971 (47 y.o. Male) Treating RN: Ahmed Prima Primary Care Lyfe Reihl: Gala Romney Other Clinician: Referring Lorissa Kishbaugh: Janie Morning Treating Constanza Mincy/Extender: Ricard Dillon Weeks in Treatment: 0 Edema Assessment Assessed: [Left: Yes] [Right: Yes] [Left: Edema] [Right: :] Electronic Signature(s) Signed: 07/21/2017 4:48:17 PM By: Alric Quan Entered By: Alric Quan on 07/21/2017 08:30:19 John Bailey ([id number]) -------------------------------------------------------------------------------- Multi Wound Chart Details Patient Name: John Bailey. Date of Service: 07/21/2017 8:00 AM Medical Record Number: [id number] Patient Account Number: [id number] Date of Birth/Sex: July 11, 1970 (47 y.o. Male) Treating RN: Cornell Barman Primary Care Sheena Donegan: Gala Romney Other Clinician: Referring Yazleemar Strassner: Janie Morning Treating Dorse Locy/Extender: Ricard Dillon Weeks in Treatment: 0 Vital Signs Height(in): 71 Pulse(bpm): 82 Weight(lbs): 263 Blood Pressure(mmHg): 115/80 Body Mass Index(BMI):  37 Temperature(F): 98.0 Respiratory Rate 18 (breaths/min): Photos: [1:No Photos] [2:No Photos] [3:No Photos] Wound Location: [1:Sacrum] [2:Right Gluteus - Proximal] [3:Right Gluteus - Distal] Wounding Event: [1:Pressure Injury] [2:Pressure Injury] [3:Pressure Injury] Primary Etiology: [1:Pressure Ulcer] [2:Pressure Ulcer] [3:Pressure Ulcer] Comorbid History: [1:Arrhythmia, Congestive Heart Arrhythmia, Congestive Heart Arrhythmia, Congestive Heart Failure, Hypertension, Peripheral Venous Disease, Peripheral Venous Disease, Peripheral Venous Disease, Type II Diabetes, End Stage Type II  Diabetes, End Stage Type II Diabetes, End Stage Renal Disease, History of pressure wounds, Rheumatoid pressure wounds, Rheumatoid pressure wounds, Rheumatoid Arthritis, Neuropathy] [2:Failure, Hypertension, Renal Disease, History of Arthritis,  Neuropathy] [3:Failure, Hypertension, Renal Disease, History of Arthritis, Neuropathy] Date Acquired: [1:06/21/2017] [2:06/21/2017] [3:06/21/2017] Weeks of Treatment: [1:0] [2:0] [3:0] Wound Status: [1:Open] [  2:Open] [3:Open] Measurements L x W x D [1:6.5x4.2x5] [2:10.5x4.5x3.5] [3:1x1.5x0.6] (cm) Area (cm) : [1:21.441] [2:37.11] [3:1.178] Volume (cm) : [1:107.207] [2:129.885] [3:0.707] % Reduction in Area: [1:N/A] [2:N/A] [3:0.00%] % Reduction in Volume: [1:N/A] [2:N/A] [3:0.00%] Position 1 (o'clock): [2:5] Maximum Distance 1 (cm): [2:7] Starting Position 1 [1:11] (o'clock): Ending Position 1 [1:3] (o'clock): Maximum Distance 1 (cm): [1:3] Tunneling: [1:No] [2:Yes] [3:No] Undermining: [1:Yes] [2:No] [3:No] Classification: [1:Category/Stage IV] [2:Category/Stage IV] [3:Category/Stage IV] Exudate Amount: [1:Large] [2:Large] [3:Large] Exudate Type: [1:Serosanguineous] [2:Serosanguineous] [3:Serosanguineous] Exudate Color: [1:red, brown] [2:red, brown] [3:red, brown] Wound Margin: [1:Distinct, outline attached] [2:Distinct, outline attached] [3:Distinct, outline  attached] Granulation Amount: [1:Large (67-100%)] [2:Large (67-100%)] [3:Large (67-100%)] Granulation Quality: [1:Red] [2:Red] [3:Red] Necrotic Amount: [1:Small (1-33%)] [2:N/A] [3:Small (1-33%)] Exposed Structures: Fascia: Yes Fascia: Yes Fat Layer (Subcutaneous Fat Layer (Subcutaneous Fat Layer (Subcutaneous Tissue) Exposed: Yes Tissue) Exposed: Yes Tissue) Exposed: Yes Fascia: No Muscle: Yes Muscle: Yes Muscle: No Bone: Yes Epithelialization: None None None Periwound Skin Texture: No Abnormalities Noted No Abnormalities Noted Excoriation: No Induration: No Callus: No Crepitus: No Rash: No Scarring: No Periwound Skin Moisture: No Abnormalities Noted No Abnormalities Noted Maceration: No Dry/Scaly: No Periwound Skin Color: No Abnormalities Noted No Abnormalities Noted Atrophie Blanche: No Cyanosis: No Ecchymosis: No Erythema: No Hemosiderin Staining: No Mottled: No Pallor: No Rubor: No Temperature: No Abnormality No Abnormality No Abnormality Tenderness on Palpation: Yes Yes Yes Wound Preparation: Ulcer Cleansing: Ulcer Cleansing: Ulcer Cleansing: Rinsed/Irrigated with Saline Rinsed/Irrigated with Saline Rinsed/Irrigated with Saline Topical Anesthetic Applied: Topical Anesthetic Applied: Topical Anesthetic Applied: Other: lidocaine 4% Other: lidocaine 4% Other: lidocaine 4% Treatment Notes Electronic Signature(s) Signed: 07/22/2017 1:16:01 PM By: Linton Ham MD Entered By: Linton Ham on 07/21/2017 09:36:19 John Bailey ([id number]) -------------------------------------------------------------------------------- [place] Details Patient Name: John Bailey, John Bailey. Date of Service: 07/21/2017 8:00 AM Medical Record Number: [id number] Patient Account Number: [id number] Date of Birth/Sex: 09-24-1970 (47 y.o. Male) Treating RN: Cornell Barman Primary Care Norene Oliveri: Gala Romney Other Clinician: Referring Afnan Cadiente: Janie Morning Treating  Kayden Hutmacher/Extender: Tito Dine in Treatment: 0 Active Inactive ` Abuse / Safety / Falls / Self Care Management Nursing Diagnoses: Potential for falls Self care deficit: actual or potential Goals: Patient/caregiver will verbalize understanding of skin care regimen Date Initiated: 07/21/2017 Target Resolution Date: 08/21/2017 Goal Status: Active Interventions: Podiatry chair, stretcher in low position and side rails up as needed Notes: ` Nutrition Nursing Diagnoses: Impaired glucose control: actual or potential Goals: Patient/caregiver will maintain therapeutic glucose control Date Initiated: 07/21/2017 Target Resolution Date: 08/21/2017 Goal Status: Active Interventions: Assess patient nutrition upon admission and as needed per policy Treatment Activities: Patient referred to Primary Care Physician for further nutritional evaluation : 07/21/2017 Notes: ` Pressure Nursing Diagnoses: Potential for impaired tissue integrity related to pressure, friction, moisture, and shear Goals: Patient will remain free from development of additional pressure ulcers John Bailey, John Bailey ([id number]) Date Initiated: 07/21/2017 Target Resolution Date: 08/21/2017 Goal Status: Active Interventions: Assess: immobility, friction, shearing, incontinence upon admission and as needed Assess offloading mechanisms upon admission and as needed Assess potential for pressure ulcer upon admission and as needed Provide education on pressure ulcers Treatment Activities: Patient referred for pressure reduction/relief devices : 07/21/2017 Notes: ` Wound/Skin Impairment Nursing Diagnoses: Impaired tissue integrity Goals: Ulcer/skin breakdown will heal within 14 weeks Date Initiated: 07/21/2017 Target Resolution Date: 11/10/2017 Goal Status: Active Interventions: Assess patient/caregiver ability to perform ulcer/skin care regimen upon admission and as needed Treatment Activities: Skin care regimen initiated  : 07/21/2017 Topical wound management  initiated : 07/21/2017 Notes: Electronic Signature(s) Signed: 07/21/2017 5:47:31 PM By: Gretta Cool, BSN, RN, CWS, Kim RN, BSN Entered By: Gretta Cool, BSN, RN, CWS, Kim on 07/21/2017 09:05:06 John Bailey ([id number]) -------------------------------------------------------------------------------- Pain Assessment Details Patient Name: John Bailey, John Bailey. Date of Service: 07/21/2017 8:00 AM Medical Record Number: [id number] Patient Account Number: [id number] Date of Birth/Sex: Jun 27, 1970 (47 y.o. Male) Treating RN: Ahmed Prima Primary Care Shyloh Derosa: Gala Romney Other Clinician: Referring Emanuela Runnion: Janie Morning Treating Nakeya Adinolfi/Extender: Ricard Dillon Weeks in Treatment: 0 Active Problems Location of Pain Severity and Description of Pain Patient Has Paino No Site Locations Pain Management and Medication Current Pain Management: Electronic Signature(s) Signed: 07/21/2017 4:48:17 PM By: Alric Quan Entered By: Alric Quan on 07/21/2017 08:16:41 John Bailey ([id number]) -------------------------------------------------------------------------------- Patient/Caregiver Education Details Patient Name: John Bailey. Date of Service: 07/21/2017 8:00 AM Medical Record Number: [id number] Patient Account Number: [id number] Date of Birth/Gender: 1970/04/21 (47 y.o. Male) Treating RN: Montey Hora Primary Care Physician: Gala Romney Other Clinician: Referring Physician: Janie Morning Treating Physician/Extender: Tito Dine in Treatment: 0 Education Assessment Education Provided To: Caregiver SNF nurses via written orders Education Topics Provided Wound/Skin Impairment: Handouts: Other: wound care orders Methods: Printed Electronic Signature(s) Signed: 07/22/2017 4:34:09 PM By: Montey Hora Entered By: Montey Hora on 07/21/2017 09:49:12 John Bailey  ([id number]) -------------------------------------------------------------------------------- Wound Assessment Details Patient Name: John Bailey. Date of Service: 07/21/2017 8:00 AM Medical Record Number: [id number] Patient Account Number: [id number] Date of Birth/Sex: [birth year] (47 y.o. Male) Treating RN: Carolyne Fiscal, Debi Primary Care Lashawna Poche: Gala Romney Other Clinician: Referring Harinder Romas: Janie Morning Treating Besnik Febus/Extender: Ricard Dillon Weeks in Treatment: 0 Wound Status Wound Number: 1 Primary Pressure Ulcer Etiology: Wound Location: Sacrum Wound Open Wounding Event: Pressure Injury Status: Date Acquired: 06/21/2017 Comorbid Arrhythmia, Congestive Heart Failure, Weeks Of Treatment: 0 History: Hypertension, Peripheral Venous Disease, Type Clustered Wound: No II Diabetes, End Stage Renal Disease, History of pressure wounds, Rheumatoid Arthritis, Neuropathy Photos Photo Uploaded By: Roger Shelter on 07/21/2017 16:51:29 Wound Measurements Length: (cm) 6.5 Width: (cm) 4.2 Depth: (cm) 5 Area: (cm) 21.441 Volume: (cm) 107.207 % Reduction in Area: % Reduction in Volume: Epithelialization: None Tunneling: No Undermining: Yes Starting Position (o'clock): 11 Ending Position (o'clock): 3 Maximum Distance: (cm) 3 Wound Description Classification: Category/Stage IV Wound Margin: Distinct, outline attached Exudate Amount: Large Exudate Type: Serosanguineous Exudate Color: red, brown Foul Odor After Cleansing: No Wound Bed Granulation Amount: Large (67-100%) Exposed Structure Granulation Quality: Red Fascia Exposed: Yes Necrotic Amount: Small (1-33%) Fat Layer (Subcutaneous Tissue) Exposed: Yes John Bailey, John Bailey ([id number]) Necrotic Quality: Adherent Slough Muscle Exposed: Yes Necrosis of Muscle: No Bone Exposed: Yes Periwound Skin Texture Texture Color No Abnormalities Noted: No No Abnormalities Noted: No Moisture Temperature / Pain No  Abnormalities Noted: No Temperature: No Abnormality Tenderness on Palpation: Yes Wound Preparation Ulcer Cleansing: Rinsed/Irrigated with Saline Topical Anesthetic Applied: Other: lidocaine 4%, Treatment Notes Wound #1 (Sacrum) 1. Cleansed with: Clean wound with Normal Saline 2. Anesthetic Topical Lidocaine 4% cream to wound bed prior to debridement 4. Dressing Applied: Other dressing (specify in notes) 5. Secondary Dressing Applied ABD Pad Notes dakins soaked gauze, xtrasorb, secured with tape Electronic Signature(s) Signed: 07/21/2017 4:48:17 PM By: Alric Quan Entered By: Alric Quan on 07/21/2017 08:32:27 John Bailey ([id number]) -------------------------------------------------------------------------------- Wound Assessment Details Patient Name: John Bailey. Date of Service: 07/21/2017 8:00 AM Medical Record Number: [id number] Patient Account Number: [id number] Date of Birth/Sex: [birth year] (47 y.o. Male) Treating RN: Ahmed Prima Primary Care Shaylynn Nulty: [name],  John Bailey Other Clinician: Referring Ulyssa Walthour: Janie Morning Treating Lowen Barringer/Extender: Ricard Dillon Weeks in Treatment: 0 Wound Status Wound Number: 2 Primary Pressure Ulcer Etiology: Wound Location: Right Gluteus - Proximal Wound Open Wounding Event: Pressure Injury Status: Date Acquired: 06/21/2017 Comorbid Arrhythmia, Congestive Heart Failure, Weeks Of Treatment: 0 History: Hypertension, Peripheral Venous Disease, Type Clustered Wound: No II Diabetes, End Stage Renal Disease, History of pressure wounds, Rheumatoid Arthritis, Neuropathy Photos Photo Uploaded By: Roger Shelter on 07/21/2017 16:51:29 Wound Measurements Length: (cm) 10.5 Width: (cm) 4.5 Depth: (cm) 3.5 Area: (cm) 37.11 Volume: (cm) 129.885 % Reduction in Area: % Reduction in Volume: Epithelialization: None Tunneling: Yes Position (o'clock): 5 Maximum Distance: (cm) 7 Undermining: No Wound  Description Classification: Category/Stage IV Wound Margin: Distinct, outline attached Exudate Amount: Large Exudate Type: Serosanguineous Exudate Color: red, brown Foul Odor After Cleansing: No Slough/Fibrino Yes Wound Bed Granulation Amount: Large (67-100%) Exposed Structure Granulation Quality: Red Fascia Exposed: Yes Fat Layer (Subcutaneous Tissue) Exposed: Yes Muscle Exposed: Yes John Bailey, John S. ([id number]) Necrosis of Muscle: No Periwound Skin Texture Texture Color No Abnormalities Noted: No No Abnormalities Noted: No Moisture Temperature / Pain No Abnormalities Noted: No Temperature: No Abnormality Tenderness on Palpation: Yes Wound Preparation Ulcer Cleansing: Rinsed/Irrigated with Saline Topical Anesthetic Applied: Other: lidocaine 4%, Treatment Notes Wound #2 (Right, Proximal Gluteus) 1. Cleansed with: Clean wound with Normal Saline 2. Anesthetic Topical Lidocaine 4% cream to wound bed prior to debridement 4. Dressing Applied: Other dressing (specify in notes) 5. Secondary Dressing Applied ABD Pad Notes dakins soaked gauze, xtrasorb, secured with tape Electronic Signature(s) Signed: 07/21/2017 4:48:17 PM By: Alric Quan Entered By: Alric Quan on 07/21/2017 08:34:22 John Bailey ([id number]) -------------------------------------------------------------------------------- Wound Assessment Details Patient Name: John Bailey. Date of Service: 07/21/2017 8:00 AM Medical Record Number: [id number] Patient Account Number: [id number] Date of Birth/Sex: Mar 06, 1971 (47 y.o. Male) Treating RN: Carolyne Fiscal, Debi Primary Care Mervyn Pflaum: Gala Romney Other Clinician: Referring Manning Luna: Janie Morning Treating Robben Jagiello/Extender: Ricard Dillon Weeks in Treatment: 0 Wound Status Wound Number: 3 Primary Pressure Ulcer Etiology: Wound Location: Right Gluteus - Distal Wound Open Wounding Event: Pressure Injury Status: Date Acquired:  06/21/2017 Comorbid Arrhythmia, Congestive Heart Failure, Weeks Of Treatment: 0 History: Hypertension, Peripheral Venous Disease, Type Clustered Wound: No II Diabetes, End Stage Renal Disease, History of pressure wounds, Rheumatoid Arthritis, Neuropathy Photos Photo Uploaded By: Roger Shelter on 07/21/2017 16:51:55 Wound Measurements Length: (cm) 1 Width: (cm) 1.5 Depth: (cm) 0.6 Area: (cm) 1.178 Volume: (cm) 0.707 % Reduction in Area: 0% % Reduction in Volume: 0% Epithelialization: None Tunneling: No Undermining: No Wound Description Classification: Category/Stage IV Wound Margin: Distinct, outline attached Exudate Amount: Large Exudate Type: Serosanguineous Exudate Color: red, brown Foul Odor After Cleansing: No Slough/Fibrino Yes Wound Bed Granulation Amount: Large (67-100%) Exposed Structure Granulation Quality: Red Fascia Exposed: No Necrotic Amount: Small (1-33%) Fat Layer (Subcutaneous Tissue) Exposed: Yes Necrotic Quality: Adherent Slough Muscle Exposed: No Periwound Skin Texture Texture Color John Bailey, John S. ([id number]) No Abnormalities Noted: No No Abnormalities Noted: No Callus: No Atrophie Blanche: No Crepitus: No Cyanosis: No Excoriation: No Ecchymosis: No Induration: No Erythema: No Rash: No Hemosiderin Staining: No Scarring: No Mottled: No Pallor: No Moisture Rubor: No No Abnormalities Noted: No Dry / Scaly: No Temperature / Pain Maceration: No Temperature: No Abnormality Tenderness on Palpation: Yes Wound Preparation Ulcer Cleansing: Rinsed/Irrigated with Saline Topical Anesthetic Applied: Other: lidocaine 4%, Treatment Notes Wound #3 (Right, Distal Gluteus) 1. Cleansed with: Clean wound with Normal Saline 2. Anesthetic Topical Lidocaine 4%  cream to wound bed prior to debridement 4. Dressing Applied: Prisma Ag 5. Secondary Dressing Applied ABD Pad Notes secured with tape Electronic Signature(s) Signed: 07/21/2017  4:48:17 PM By: Alric Quan Signed: 07/21/2017 5:47:31 PM By: Gretta Cool, BSN, RN, CWS, Kim RN, BSN Entered By: Gretta Cool, BSN, RN, CWS, Kim on 07/21/2017 09:14:06 John Bailey ([id number]) -------------------------------------------------------------------------------- [place] Details Patient Name: John Bailey, John Bailey Date of Service: 07/21/2017 8:00 AM Medical Record Number: [id number] Patient Account Number: [id number] Date of Birth/Sex: December 26, 1970 (46 y.o. Male) Treating RN: Carolyne Fiscal, Debi Primary Care Sharad Vaneaton: Gala Romney Other Clinician: Referring Arriyana Rodell: Janie Morning Treating Nusaiba Guallpa/Extender: Ricard Dillon Weeks in Treatment: 0 Vital Signs Time Taken: 08:17 Temperature (F): 98.0 Height (in): 71 Pulse (bpm): 82 Source: Stated Respiratory Rate (breaths/min): 18 Weight (lbs): 263 Blood Pressure (mmHg): 115/80 Source: Stated Reference Range: 80 - 120 mg / dl Body Mass Index (BMI): 36.7 Electronic Signature(s) Signed: 07/21/2017 4:48:17 PM By: Alric Quan Entered By: Alric Quan on 07/21/2017 08:17:21

## 2017-07-27 ENCOUNTER — Inpatient Hospital Stay: Payer: BLUE CROSS/BLUE SHIELD | Admitting: Family

## 2017-07-28 ENCOUNTER — Encounter: Payer: BLUE CROSS/BLUE SHIELD | Admitting: Internal Medicine

## 2017-07-28 DIAGNOSIS — L89314 Pressure ulcer of right buttock, stage 4: Secondary | ICD-10-CM | POA: Diagnosis not present

## 2017-07-30 NOTE — Progress Notes (Signed)
John Bailey, John Bailey ([id number]) Visit Report for 07/28/2017 Arrival Information Details Patient Name: John Bailey, John Bailey. Date of Service: 07/28/2017 8:45 AM Medical Record Number: [id number] Patient Account Number: [id number] Date of Birth/Sex: 09/30/1970 (47 y.o. M) Treating RN: Montey Hora Primary Care Mekayla Soman: Gala Romney Other Clinician: Referring Lebron Nauert: Gala Romney Treating Omare Bilotta/Extender: Tito Dine in Treatment: 1 Visit Information History Since Last Visit Added or deleted any medications: No Patient Arrived: Wheel Chair Any new allergies or adverse reactions: No Arrival Time: 08:40 Had a fall or experienced change in No Accompanied By: brother activities of daily living that may affect Transfer Assistance: Manual risk of falls: Patient Identification Verified: Yes Signs or symptoms of abuse/neglect since last visito No Secondary Verification Process Yes Hospitalized since last visit: No Completed: Implantable device outside of the clinic excluding No Patient Requires Transmission-Based No cellular tissue based products placed in the center Precautions: since last visit: Patient Has Alerts: Yes Has Dressing in Place as Prescribed: Yes Patient Alerts: Patient on Blood Pain Present Now: No Thinner DM II Eliquis Electronic Signature(s) Signed: 07/28/2017 4:31:04 PM By: Montey Hora Entered By: Montey Hora on 07/28/2017 08:40:58 John Bailey ([id number]) -------------------------------------------------------------------------------- Clinic Level of Care Assessment Details Patient Name: John Bailey. Date of Service: 07/28/2017 8:45 AM Medical Record Number: [id number] Patient Account Number: [id number] Date of Birth/Sex: 04/05/1970 (47 y.o. M) Treating RN: Cornell Barman Primary Care Tonique Mendonca: Gala Romney Other Clinician: Referring Carlie Corpus: Gala Romney Treating Makelle Marrone/Extender: Tito Dine in Treatment:  1 Clinic Level of Care Assessment Items TOOL 4 Quantity Score []  - Use when only an EandM is performed on FOLLOW-UP visit 0 ASSESSMENTS - Nursing Assessment / Reassessment X - Reassessment of Co-morbidities (includes updates in patient status) 1 10 X- 1 5 Reassessment of Adherence to Treatment Plan ASSESSMENTS - Wound and Skin Assessment / Reassessment []  - Simple Wound Assessment / Reassessment - one wound 0 X- 1 5 Complex Wound Assessment / Reassessment - multiple wounds []  - 0 Dermatologic / Skin Assessment (not related to wound area) ASSESSMENTS - Focused Assessment []  - Circumferential Edema Measurements - multi extremities 0 []  - 0 Nutritional Assessment / Counseling / Intervention []  - 0 Lower Extremity Assessment (monofilament, tuning fork, pulses) []  - 0 Peripheral Arterial Disease Assessment (using hand held doppler) ASSESSMENTS - Ostomy and/or Continence Assessment and Care []  - Incontinence Assessment and Management 0 []  - 0 Ostomy Care Assessment and Management (repouching, etc.) PROCESS - Coordination of Care []  - Simple Patient / Family Education for ongoing care 0 X- 1 20 Complex (extensive) Patient / Family Education for ongoing care []  - 0 Staff obtains Programmer, systems, Records, Test Results / Process Orders []  - 0 Staff telephones HHA, Nursing Homes / Clarify orders / etc []  - 0 Routine Transfer to another Facility (non-emergent condition) []  - 0 Routine Hospital Admission (non-emergent condition) []  - 0 New Admissions / Biomedical engineer / Ordering NPWT, Apligraf, etc. []  - 0 Emergency Hospital Admission (emergent condition) X- 1 10 Simple Discharge Coordination John Bailey, John Bailey. ([id number]) []  - 0 Complex (extensive) Discharge Coordination PROCESS - Special Needs []  - Pediatric / Minor Patient Management 0 []  - 0 Isolation Patient Management []  - 0 Hearing / Language / Visual special needs []  - 0 Assessment of Community assistance  (transportation, D/C planning, etc.) []  - 0 Additional assistance / Altered mentation []  - 0 Support Surface(s) Assessment (bed, cushion, seat, etc.) INTERVENTIONS - Wound Cleansing / Measurement []  - Simple Wound Cleansing - one  wound 0 X- 1 5 Complex Wound Cleansing - multiple wounds X- 1 5 Wound Imaging (photographs - any number of wounds) []  - 0 Wound Tracing (instead of photographs) []  - 0 Simple Wound Measurement - one wound X- 1 5 Complex Wound Measurement - multiple wounds INTERVENTIONS - Wound Dressings []  - Small Wound Dressing one or multiple wounds 0 []  - 0 Medium Wound Dressing one or multiple wounds X- 1 20 Large Wound Dressing one or multiple wounds []  - 0 Application of Medications - topical []  - 0 Application of Medications - injection INTERVENTIONS - Miscellaneous []  - External ear exam 0 []  - 0 Specimen Collection (cultures, biopsies, blood, body fluids, etc.) []  - 0 Specimen(s) / Culture(s) sent or taken to Lab for analysis []  - 0 Patient Transfer (multiple staff / Civil Service fast streamer / Similar devices) []  - 0 Simple Staple / Suture removal (25 or less) []  - 0 Complex Staple / Suture removal (26 or more) []  - 0 Hypo / Hyperglycemic Management (close monitor of Blood Glucose) []  - 0 Ankle / Brachial Index (ABI) - do not check if billed separately X- 1 5 Vital Signs John Bailey, John S. ([id number]) Has the patient been seen at the hospital within the last three years: Yes Total Score: 90 Level Of Care: New/Established - Level 3 Electronic Signature(s) Signed: 07/28/2017 4:52:44 PM By: Gretta Cool, BSN, RN, CWS, Kim RN, BSN Entered By: Gretta Cool, BSN, RN, CWS, Kim on 07/28/2017 09:33:32 John Bailey ([id number]) -------------------------------------------------------------------------------- Encounter Discharge Information Details Patient Name: John Bailey. Date of Service: 07/28/2017 8:45 AM Medical Record Number: [id number] Patient Account Number:  [id number] Date of Birth/Sex: 05-21-1970 (47 y.o. M) Treating RN: Montey Hora Primary Care Jester Klingberg: Gala Romney Other Clinician: Referring Ebubechukwu Jedlicka: Gala Romney Treating Tristyn Pharris/Extender: Tito Dine in Treatment: 1 Encounter Discharge Information Items Discharge Condition: Stable Ambulatory Status: Wheelchair Discharge Destination: Skilled Nursing Facility Telephoned: No Orders Sent: Yes Transportation: Private Auto Accompanied By: brother Schedule Follow-up Appointment: Yes Clinical Summary of Care: Electronic Signature(s) Signed: 07/28/2017 11:35:24 AM By: Montey Hora Entered By: Montey Hora on 07/28/2017 11:35:24 John Bailey ([id number]) -------------------------------------------------------------------------------- Lower Extremity Assessment Details Patient Name: John Bailey. Date of Service: 07/28/2017 8:45 AM Medical Record Number: [id number] Patient Account Number: [id number] Date of Birth/Sex: May 03, 1970 (47 y.o. M) Treating RN: Montey Hora Primary Care Latha Staunton: Gala Romney Other Clinician: Referring Nayquan Evinger: Gala Romney Treating Paighton Godette/Extender: Ricard Dillon Weeks in Treatment: 1 Electronic Signature(s) Signed: 07/28/2017 4:31:04 PM By: Montey Hora Entered By: Montey Hora on 07/28/2017 09:05:20 John Bailey ([id number]) -------------------------------------------------------------------------------- Multi Wound Chart Details Patient Name: John Bailey. Date of Service: 07/28/2017 8:45 AM Medical Record Number: [id number] Patient Account Number: [id number] Date of Birth/Sex: 05/26/1970 (47 y.o. M) Treating RN: Cornell Barman Primary Care Larrie Fraizer: Gala Romney Other Clinician: Referring Marica Trentham: Gala Romney Treating Alixandrea Milleson/Extender: Tito Dine in Treatment: 1 Vital Signs Height(in): 71 Pulse(bpm): 81 Weight(lbs): 263 Blood Pressure(mmHg): 122/76 Body Mass Index(BMI):  37 Temperature(F): 97.5 Respiratory Rate 18 (breaths/min): Photos: [1:No Photos] [2:No Photos] [3:No Photos] Wound Location: [1:Sacrum] [2:Right Gluteus - Proximal] [3:Right Gluteus - Distal] Wounding Event: [1:Pressure Injury] [2:Pressure Injury] [3:Pressure Injury] Primary Etiology: [1:Pressure Ulcer] [2:Pressure Ulcer] [3:Pressure Ulcer] Comorbid History: [1:Arrhythmia, Congestive Heart Arrhythmia, Congestive Heart Arrhythmia, Congestive Heart Failure, Hypertension, Peripheral Venous Disease, Peripheral Venous Disease, Peripheral Venous Disease, Type II Diabetes, End Stage Type II  Diabetes, End Stage Type II Diabetes, End Stage Renal Disease, History of pressure wounds, Rheumatoid pressure wounds, Rheumatoid pressure wounds,  Rheumatoid Arthritis, Neuropathy] [2:Failure, Hypertension, Renal Disease, History of Arthritis,  Neuropathy] [3:Failure, Hypertension, Renal Disease, History of Arthritis, Neuropathy] Date Acquired: [1:06/21/2017] [2:06/21/2017] [3:06/21/2017] Weeks of Treatment: [1:1] [2:1] [3:1] Wound Status: [1:Open] [2:Open] [3:Open] Measurements L x W x D [1:6.1x2.5x5.3] [2:10x2.8x2.7] [3:0.7x1x0.2] (cm) Area (cm) : [1:11.977] [2:21.991] [3:0.55] Volume (cm) : [1:63.48] [2:59.376] [3:0.11] % Reduction in Area: [1:44.10%] [2:40.70%] [3:53.30%] % Reduction in Volume: [1:40.80%] [2:54.30%] [3:84.40%] Classification: [1:Category/Stage IV] [2:Category/Stage IV] [3:Category/Stage IV] Exudate Amount: [1:Large] [2:Large] [3:Large] Exudate Type: [1:Serosanguineous] [2:Serosanguineous] [3:Serosanguineous] Exudate Color: [1:red, brown] [2:red, brown] [3:red, brown] Wound Margin: [1:Distinct, outline attached] [2:Distinct, outline attached] [3:Distinct, outline attached] Granulation Amount: [1:Large (67-100%)] [2:Large (67-100%)] [3:Large (67-100%)] Granulation Quality: [1:Red] [2:Red] [3:Red] Necrotic Amount: [1:None Present (0%)] [2:N/A] [3:Small (1-33%)] Exposed  Structures: [1:Fascia: Yes Fat Layer (Subcutaneous Tissue) Exposed: Yes Muscle: Yes Bone: Yes] [2:Fascia: Yes Fat Layer (Subcutaneous Tissue) Exposed: Yes Muscle: Yes] [3:Fat Layer (Subcutaneous Tissue) Exposed: Yes Fascia: No Muscle: No] Epithelialization: [1:None] [2:None] [3:None] Periwound Skin Texture: [1:No Abnormalities Noted] [2:No Abnormalities Noted] [3:Excoriation: No Induration: No Callus: No] Crepitus: No Rash: No Scarring: No Periwound Skin Moisture: No Abnormalities Noted No Abnormalities Noted Maceration: No Dry/Scaly: No Periwound Skin Color: No Abnormalities Noted No Abnormalities Noted Atrophie Blanche: No Cyanosis: No Ecchymosis: No Erythema: No Hemosiderin Staining: No Mottled: No Pallor: No Rubor: No Temperature: No Abnormality No Abnormality No Abnormality Tenderness on Palpation: Yes Yes Yes Wound Preparation: Ulcer Cleansing: Ulcer Cleansing: Ulcer Cleansing: Rinsed/Irrigated with Saline Rinsed/Irrigated with Saline Rinsed/Irrigated with Saline Topical Anesthetic Applied: Topical Anesthetic Applied: Topical Anesthetic Applied: Other: lidocaine 4% Other: lidocaine 4% Other: lidocaine 4% Treatment Notes Electronic Signature(s) Signed: 07/28/2017 4:50:03 PM By: Linton Ham MD Entered By: Linton Ham on 07/28/2017 10:38:52 John Bailey ([id number]) -------------------------------------------------------------------------------- [place] Details Patient Name: John Bailey, John Bailey. Date of Service: 07/28/2017 8:45 AM Medical Record Number: [id number] Patient Account Number: [id number] Date of Birth/Sex: 26-Mar-1970 (47 y.o. M) Treating RN: Cornell Barman Primary Care Verna Hamon: Gala Romney Other Clinician: Referring Burlene Montecalvo: Gala Romney Treating Danzig Macgregor/Extender: Tito Dine in Treatment: 1 Active Inactive ` Abuse / Safety / Falls / Self Care Management Nursing Diagnoses: Potential for falls Self care  deficit: actual or potential Goals: Patient/caregiver will verbalize understanding of skin care regimen Date Initiated: 07/21/2017 Target Resolution Date: 08/21/2017 Goal Status: Active Interventions: Podiatry chair, stretcher in low position and side rails up as needed Notes: ` Nutrition Nursing Diagnoses: Impaired glucose control: actual or potential Goals: Patient/caregiver will maintain therapeutic glucose control Date Initiated: 07/21/2017 Target Resolution Date: 08/21/2017 Goal Status: Active Interventions: Assess patient nutrition upon admission and as needed per policy Treatment Activities: Patient referred to Primary Care Physician for further nutritional evaluation : 07/21/2017 Notes: ` Pressure Nursing Diagnoses: Potential for impaired tissue integrity related to pressure, friction, moisture, and shear Goals: Patient will remain free from development of additional pressure ulcers John Bailey, John Bailey ([id number]) Date Initiated: 07/21/2017 Target Resolution Date: 08/21/2017 Goal Status: Active Interventions: Assess: immobility, friction, shearing, incontinence upon admission and as needed Assess offloading mechanisms upon admission and as needed Assess potential for pressure ulcer upon admission and as needed Provide education on pressure ulcers Treatment Activities: Patient referred for pressure reduction/relief devices : 07/21/2017 Notes: ` Wound/Skin Impairment Nursing Diagnoses: Impaired tissue integrity Goals: Ulcer/skin breakdown will heal within 14 weeks Date Initiated: 07/21/2017 Target Resolution Date: 11/10/2017 Goal Status: Active Interventions: Assess patient/caregiver ability to perform ulcer/skin care regimen upon admission and as needed Treatment Activities: Skin care regimen initiated : 07/21/2017 Topical  wound management initiated : 07/21/2017 Notes: Electronic Signature(s) Signed: 07/28/2017 4:52:44 PM By: Gretta Cool, BSN, RN, CWS, Kim RN, BSN Entered By: Gretta Cool,  BSN, RN, CWS, Kim on 07/28/2017 09:28:44 John Bailey ([id number]) -------------------------------------------------------------------------------- Pain Assessment Details Patient Name: John Bailey, John Bailey. Date of Service: 07/28/2017 8:45 AM Medical Record Number: [id number] Patient Account Number: [id number] Date of Birth/Sex: 31-Jan-1971 (47 y.o. M) Treating RN: Montey Hora Primary Care Jazae Gandolfi: Gala Romney Other Clinician: Referring Candance Bohlman: Gala Romney Treating Agnes Probert/Extender: Tito Dine in Treatment: 1 Active Problems Location of Pain Severity and Description of Pain Patient Has Paino No Site Locations Pain Management and Medication Current Pain Management: Electronic Signature(s) Signed: 07/28/2017 4:31:04 PM By: Montey Hora Entered By: Montey Hora on 07/28/2017 08:41:39 John Bailey ([id number]) -------------------------------------------------------------------------------- Patient/Caregiver Education Details Patient Name: John Bailey. Date of Service: 07/28/2017 8:45 AM Medical Record Number: [id number] Patient Account Number: [id number] Date of Birth/Gender: Feb 04, 1971 (47 y.o. M) Treating RN: Montey Hora Primary Care Physician: Gala Romney Other Clinician: Referring Physician: Gala Romney Treating Physician/Extender: Tito Dine in Treatment: 1 Education Assessment Education Provided To: Caregiver SNF nurses via written orders Education Topics Provided Wound/Skin Impairment: Handouts: Other: wound care orders Methods: Printed Electronic Signature(s) Signed: 07/28/2017 4:31:04 PM By: Montey Hora Entered By: Montey Hora on 07/28/2017 11:48:51 John Bailey ([id number]) -------------------------------------------------------------------------------- Wound Assessment Details Patient Name: John Bailey. Date of Service: 07/28/2017 8:45 AM Medical Record Number: [id number] Patient Account  Number: [id number] Date of Birth/Sex: 12-19-1970 (47 y.o. M) Treating RN: Montey Hora Primary Care Saleema Weppler: Gala Romney Other Clinician: Referring Khalil Szczepanik: Gala Romney Treating Adriona Kaney/Extender: Tito Dine in Treatment: 1 Wound Status Wound Number: 1 Primary Pressure Ulcer Etiology: Wound Location: Sacrum Wound Open Wounding Event: Pressure Injury Status: Date Acquired: 06/21/2017 Comorbid Arrhythmia, Congestive Heart Failure, Weeks Of Treatment: 1 History: Hypertension, Peripheral Venous Disease, Type Clustered Wound: No II Diabetes, End Stage Renal Disease, History of pressure wounds, Rheumatoid Arthritis, Neuropathy Photos Photo Uploaded By: Montey Hora on 07/28/2017 13:07:40 Wound Measurements Length: (cm) 6.1 Width: (cm) 2.5 Depth: (cm) 5.3 Area: (cm) 11.977 Volume: (cm) 63.48 % Reduction in Area: 44.1% % Reduction in Volume: 40.8% Epithelialization: None Tunneling: No Undermining: No Wound Description Classification: Category/Stage IV Wound Margin: Distinct, outline attached Exudate Amount: Large Exudate Type: Serosanguineous Exudate Color: red, brown Foul Odor After Cleansing: No Wound Bed Granulation Amount: Large (67-100%) Exposed Structure Granulation Quality: Red Fascia Exposed: Yes Necrotic Amount: None Present (0%) Fat Layer (Subcutaneous Tissue) Exposed: Yes Muscle Exposed: Yes Necrosis of Muscle: No Bone Exposed: Yes John Bailey, John S. ([id number]) Periwound Skin Texture Texture Color No Abnormalities Noted: No No Abnormalities Noted: No Moisture Temperature / Pain No Abnormalities Noted: No Temperature: No Abnormality Tenderness on Palpation: Yes Wound Preparation Ulcer Cleansing: Rinsed/Irrigated with Saline Topical Anesthetic Applied: Other: lidocaine 4%, Treatment Notes Wound #1 (Sacrum) 1. Cleansed with: Clean wound with Normal Saline 4. Dressing Applied: Saline moistened guaze 5. Secondary Dressing  Applied ABD Pad 7. Secured with Recruitment consultant) Signed: 07/28/2017 4:31:04 PM By: Montey Hora Entered By: Montey Hora on 07/28/2017 09:04:36 John Bailey ([id number]) -------------------------------------------------------------------------------- Wound Assessment Details Patient Name: John Bailey. Date of Service: 07/28/2017 8:45 AM Medical Record Number: [id number] Patient Account Number: [id number] Date of Birth/Sex: 06-22-1970 (47 y.o. M) Treating RN: Montey Hora Primary Care Ornella Coderre: Gala Romney Other Clinician: Referring Onie Hayashi: Gala Romney Treating Dailon Sheeran/Extender: Tito Dine in Treatment: 1 Wound Status Wound Number: 2 Primary Pressure Ulcer Etiology: Wound Location: Right  Gluteus - Proximal Wound Open Wounding Event: Pressure Injury Status: Date Acquired: 06/21/2017 Comorbid Arrhythmia, Congestive Heart Failure, Weeks Of Treatment: 1 History: Hypertension, Peripheral Venous Disease, Type Clustered Wound: No II Diabetes, End Stage Renal Disease, History of pressure wounds, Rheumatoid Arthritis, Neuropathy Photos Photo Uploaded By: Montey Hora on 07/28/2017 13:07:40 Wound Measurements Length: (cm) 10 Width: (cm) 2.8 Depth: (cm) 2.7 Area: (cm) 21.991 Volume: (cm) 59.376 % Reduction in Area: 40.7% % Reduction in Volume: 54.3% Epithelialization: None Tunneling: No Undermining: No Wound Description Classification: Category/Stage IV Wound Margin: Distinct, outline attached Exudate Amount: Large Exudate Type: Serosanguineous Exudate Color: red, brown Foul Odor After Cleansing: No Slough/Fibrino Yes Wound Bed Granulation Amount: Large (67-100%) Exposed Structure Granulation Quality: Red Fascia Exposed: Yes Fat Layer (Subcutaneous Tissue) Exposed: Yes Muscle Exposed: Yes Necrosis of Muscle: No Periwound Skin Texture John Bailey, John S. ([id number]) Texture Color No Abnormalities Noted: No No  Abnormalities Noted: No Moisture Temperature / Pain No Abnormalities Noted: No Temperature: No Abnormality Tenderness on Palpation: Yes Wound Preparation Ulcer Cleansing: Rinsed/Irrigated with Saline Topical Anesthetic Applied: Other: lidocaine 4%, Treatment Notes Wound #2 (Right, Proximal Gluteus) 1. Cleansed with: Clean wound with Normal Saline 4. Dressing Applied: Saline moistened guaze 5. Secondary Dressing Applied ABD Pad 7. Secured with Recruitment consultant) Signed: 07/28/2017 4:31:04 PM By: Montey Hora Entered By: Montey Hora on 07/28/2017 09:04:46 John Bailey ([id number]) -------------------------------------------------------------------------------- Wound Assessment Details Patient Name: John Bailey. Date of Service: 07/28/2017 8:45 AM Medical Record Number: [id number] Patient Account Number: [id number] Date of Birth/Sex: [birth year] (47 y.o. M) Treating RN: Montey Hora Primary Care Carroll Ranney: Gala Romney Other Clinician: Referring Janson Lamar: Gala Romney Treating Vonnie Ligman/Extender: Tito Dine in Treatment: 1 Wound Status Wound Number: 3 Primary Pressure Ulcer Etiology: Wound Location: Right Gluteus - Distal Wound Open Wounding Event: Pressure Injury Status: Date Acquired: 06/21/2017 Comorbid Arrhythmia, Congestive Heart Failure, Weeks Of Treatment: 1 History: Hypertension, Peripheral Venous Disease, Type Clustered Wound: No II Diabetes, End Stage Renal Disease, History of pressure wounds, Rheumatoid Arthritis, Neuropathy Photos Photo Uploaded By: Montey Hora on 07/28/2017 13:08:16 Wound Measurements Length: (cm) 0.7 Width: (cm) 1 Depth: (cm) 0.2 Area: (cm) 0.55 Volume: (cm) 0.11 % Reduction in Area: 53.3% % Reduction in Volume: 84.4% Epithelialization: None Tunneling: No Undermining: No Wound Description Classification: Category/Stage IV Wound Margin: Distinct, outline attached Exudate Amount:  Large Exudate Type: Serosanguineous Exudate Color: red, brown Foul Odor After Cleansing: No Slough/Fibrino Yes Wound Bed Granulation Amount: Large (67-100%) Exposed Structure Granulation Quality: Red Fascia Exposed: No Necrotic Amount: Small (1-33%) Fat Layer (Subcutaneous Tissue) Exposed: Yes Necrotic Quality: Adherent Slough Muscle Exposed: No Periwound Skin Texture Texture Color John Bailey, John S. ([id number]) No Abnormalities Noted: No No Abnormalities Noted: No Callus: No Atrophie Blanche: No Crepitus: No Cyanosis: No Excoriation: No Ecchymosis: No Induration: No Erythema: No Rash: No Hemosiderin Staining: No Scarring: No Mottled: No Pallor: No Moisture Rubor: No No Abnormalities Noted: No Dry / Scaly: No Temperature / Pain Maceration: No Temperature: No Abnormality Tenderness on Palpation: Yes Wound Preparation Ulcer Cleansing: Rinsed/Irrigated with Saline Topical Anesthetic Applied: Other: lidocaine 4%, Treatment Notes Wound #3 (Right, Distal Gluteus) 1. Cleansed with: Clean wound with Normal Saline 4. Dressing Applied: Saline moistened guaze 5. Secondary Dressing Applied ABD Pad 7. Secured with Recruitment consultant) Signed: 07/28/2017 4:31:04 PM By: Montey Hora Entered By: Montey Hora on 07/28/2017 09:04:57 John Bailey ([id number]) -------------------------------------------------------------------------------- [place] Details Patient Name: John Bailey Date of Service: 07/28/2017 8:45 AM Medical Record Number: [id number] Patient Account  Number: [id number] Date of Birth/Sex: September 19, 1970 (47 y.o. M) Treating RN: Montey Hora Primary Care Allye Hoyos: Gala Romney Other Clinician: Referring Chancellor Vanderloop: Gala Romney Treating Nadja Lina/Extender: Tito Dine in Treatment: 1 Vital Signs Time Taken: 08:42 Temperature (F): 97.5 Height (in): 71 Pulse (bpm): 81 Weight (lbs): 263 Respiratory Rate (breaths/min):  18 Body Mass Index (BMI): 36.7 Blood Pressure (mmHg): 122/76 Reference Range: 80 - 120 mg / dl Electronic Signature(s) Signed: 07/28/2017 4:31:04 PM By: Montey Hora Entered By: Montey Hora on 07/28/2017 08:42:14

## 2017-07-30 NOTE — Progress Notes (Signed)
PIERCEN, COVINO ([id number]) Visit Report for 07/28/2017 HPI Details Patient Name: John Bailey, John Bailey. Date of Service: 07/28/2017 8:45 AM Medical Record Number: [id number] Patient Account Number: [id number] Date of Birth/Sex: [birth year] (47 y.o. M) Treating RN: Cornell Barman Primary Care Provider: Gala Romney Other Clinician: Referring Provider: Gala Romney Treating Provider/Extender: Tito Dine in Treatment: 1 History of Present Illness HPI Description: 07/21/17; this is a 47 year old patient I actually know from a stay in our [facility] clinic ending earlier this year. At the time I had looked after him largely for a wound on the left lateral leg in the setting of lymphedema, chronic renal insufficiency and congestive heart failure.we did not really control his swelling and had trouble with the wound closure because of this. Ultimately the patient stopped coming on 04/18/53 I'm not certain whether he was healed at that point or not. In any case he was admitted to hospital on 05/13/17 with acute congestive heart failure, radial renal syndrome. He had dialysis initiated. At that point in time he was noted to have a pressure ulcer in the sacrum. He had surgical debridements on this area on 3/22 and 4/1 including an IandD of a right gluteal abscess. He was readmitted to hospital from 3/26 through 4/21 with an infected sacral decubitus with A. fib/flutter and sacral osteomyelitis although I don't really see the documentation of the osteomyelitis. The CT scan done of the lumbar spine did not show this although it did not really look at the area specifically. A culture of something on 06/14/17 showed Morganella morganii. He has been on [place] at dialysis since then scheduled and on 07/25/17. He is currently at [facility] skilled facility. They're using [employer] wet to dry change daily. Per the patient and his brother there is been a remarkable improvement in the condition of the wound  surface. Wound care nurse at [facility] who I actually know had suggested that he needs a wound VAC and he is here for evaluation of the wounds and consideration of application of wound VAC The patient has a history of chronic diastolic congestive heart failure, now stage V chronic renal failure, central sleep apnea, severe rheumatoid arthritis, type 2 diabetes on insulin with peripheral neuropathy, A. fib/flutter possibly on Eliquis 07/28/17; the facility has applied a wound VAC to the wounds which are being changed 3 times a week. I am not familiar with the brand however both of the major areas over the left ischial tuberosity and the sacrum look a lot better with healthy granulation tissue. I also think the undermining/tunneling areas are contracting. Electronic Signature(s) Signed: 07/28/2017 4:50:03 PM By: Linton Ham MD Entered By: Linton Ham on 07/28/2017 10:41:18 John Bailey ([id number]) -------------------------------------------------------------------------------- Physical Exam Details Patient Name: John Bailey, John Bailey Date of Service: 07/28/2017 8:45 AM Medical Record Number: [id number] Patient Account Number: [id number] Date of Birth/Sex: 07/06/1970 (47 y.o. M) Treating RN: Cornell Barman Primary Care Provider: Gala Romney Other Clinician: Referring Provider: Gala Romney Treating Provider/Extender: Ricard Dillon Weeks in Treatment: 1 Constitutional Sitting or standing Blood Pressure is within target range for patient.. Pulse regular and within target range for patient.[name] Respirations regular, non-labored and within target range.. Temperature is normal and within the target range for the patient.[name] appears in no distress. Eyes Conjunctivae clear. No discharge. Respiratory Respiratory effort is easy and symmetric bilaterally. Rate is normal at rest and on room air.. Bilateral breath sounds are clear and equal in all lobes with no wheezes, rales or  rhonchi.. Cardiovascular  good edema control now on dialysis. Integumentary (Hair, Skin) chronic venous insufficiency in the lower extremities with lymphedema but this is a lot better than the last time I had seen this man. No evidence of infection around his wounds. Psychiatric No evidence of depression, anxiety, or agitation. Calm, cooperative, and communicative. Appropriate interactions and affect.. Notes when exam; the patient has a large stage IV wound over the right ischial tuberosity. The tunneling from 1- 4:00 is still evident but I think it is contracted somewhat. There is no palpable bone. oSmall wound on the right lower buttock looks about 50% epithelialized oSacrum again a large wound with tunneling at 4:00 I think this is contracted as well. oThe major wounds have very healthy looking granulation, obviously responding to the wound VAC. oThere is no evidence of soft tissue infection no palpable crepitus Electronic Signature(s) Signed: 07/28/2017 4:50:03 PM By: Linton Ham MD Entered By: Linton Ham on 07/28/2017 10:44:12 John Bailey ([id number]) -------------------------------------------------------------------------------- Physician Orders Details Patient Name: John Bailey. Date of Service: 07/28/2017 8:45 AM Medical Record Number: [id number] Patient Account Number: [id number] Date of Birth/Sex: 1970/09/20 (47 y.o. M) Treating RN: Cornell Barman Primary Care Provider: Gala Romney Other Clinician: Referring Provider: Gala Romney Treating Provider/Extender: Tito Dine in Treatment: 1 Verbal / Phone Orders: No Diagnosis Coding Wound Cleansing Wound #1 Sacrum o Clean wound with Normal Saline. Wound #2 Right,Proximal Gluteus o Clean wound with Normal Saline. Wound #3 Right,Distal Gluteus o Clean wound with Normal Saline. Anesthetic (add to Medication List) Wound #1 Sacrum o Topical Lidocaine 4% cream applied to wound bed prior to  debridement (In Clinic Only). Wound #2 Right,Proximal Gluteus o Topical Lidocaine 4% cream applied to wound bed prior to debridement (In Clinic Only). Wound #3 Right,Distal Gluteus o Topical Lidocaine 4% cream applied to wound bed prior to debridement (In Clinic Only). Primary Wound Dressing Wound #1 Sacrum o Other: - wet to dry until back at SNF. Wound #2 Right,Proximal Gluteus o Other: - wet to dry until back at SNF. Wound #3 Right,Distal Gluteus o Other: - wet to dry until back at SNF. Secondary Dressing Wound #1 Sacrum o ABD pad Wound #2 Right,Proximal Gluteus o ABD pad Wound #3 Right,Distal Gluteus o ABD pad Dressing Change Frequency Wound #1 Sacrum o Three times weekly John Bailey, John S. ([id number]) Wound #2 Right,Proximal Gluteus o Three times weekly Wound #3 Right,Distal Gluteus o Three times weekly Follow-up Appointments Wound #1 Sacrum o Return Appointment in 2 weeks. Wound #2 Right,Proximal Gluteus o Return Appointment in 2 weeks. Wound #3 Right,Distal Gluteus o Return Appointment in 2 weeks. Off-Loading Wound #1 Sacrum o Turn and reposition every 2 hours - Keep pressure off of wounded areas Wound #2 Right,Proximal Gluteus o Turn and reposition every 2 hours - Keep pressure off of wounded areas Wound #3 Right,Distal Gluteus o Turn and reposition every 2 hours - Keep pressure off of wounded areas Negative Pressure Wound Therapy Wound #1 Sacrum o Wound VAC settings at 125/130 mmHg continuous pressure. Use BLACK/GREEN foam to wound cavity. Use WHITE foam to fill any tunnel/s and/or undermining. Change VAC dressing 2 X WEEK. Change canister as indicated when full. Nurse may titrate settings and frequency of dressing changes as clinically indicated. - Silver Collagen under [place] Nurse may d/c VAC for s/s of increased infection, significant wound regression, or uncontrolled drainage. [place] at  [phone]. o Apply contact layer over base of wound. - Contact layer over bone Wound #2  Right,Proximal Gluteus o Wound VAC settings at 125/130 mmHg continuous pressure. Use BLACK/GREEN foam to wound cavity. Use WHITE foam to fill any tunnel/s and/or undermining. Change VAC dressing 2 X WEEK. Change canister as indicated when full. Nurse may titrate settings and frequency of dressing changes as clinically indicated. - Silver Collagen under [place] Nurse may d/c VAC for s/s of increased infection, significant wound regression, or uncontrolled drainage. [place] at [phone]. o Apply contact layer over base of wound. - Contact layer over bone Wound #3 Right,Distal Gluteus o Wound VAC settings at 125/130 mmHg continuous pressure. Use BLACK/GREEN foam to wound cavity. Use WHITE foam to fill any tunnel/s and/or undermining. Change VAC dressing 2 X WEEK. Change canister as indicated when full. Nurse may titrate settings and frequency of dressing changes as clinically indicated. - Silver Collagen under [place] Nurse may d/c VAC for s/s of increased infection, significant wound regression, or uncontrolled drainage. [place] at [phone]. o Apply contact layer over base of wound. - Contact layer over bone John Bailey, John Bailey ([id number]) Electronic Signature(s) Signed: 07/28/2017 4:50:03 PM By: Linton Ham MD Signed: 07/28/2017 4:52:44 PM By: Gretta Cool, BSN, RN, CWS, Kim RN, BSN Entered By: Gretta Cool, BSN, RN, CWS, Kim on 07/28/2017 09:38:14 John Bailey ([id number]) -------------------------------------------------------------------------------- Problem List Details Patient Name: BRAXEN, DOBEK. Date of Service: 07/28/2017 8:45 AM Medical Record Number: [id number] Patient Account Number: [id number] Date of Birth/Sex: 07/04/1970 (47 y.o. M) Treating RN: Cornell Barman Primary Care Provider: Gala Romney Other  Clinician: Referring Provider: Gala Romney Treating Provider/Extender: Tito Dine in Treatment: 1 Active Problems ICD-10 Impacting Encounter Code Description Active Date Wound Healing Diagnosis L89.154 Pressure ulcer of sacral region, stage 4 07/21/2017 Yes L89.304 Pressure ulcer of unspecified buttock, stage 4 07/21/2017 Yes L89.303 Pressure ulcer of unspecified buttock, stage 3 07/21/2017 Yes Inactive Problems Resolved Problems Electronic Signature(s) Signed: 07/28/2017 4:50:03 PM By: Linton Ham MD Entered By: Linton Ham on 07/28/2017 10:38:37 John Bailey ([id number]) -------------------------------------------------------------------------------- Progress Note Details Patient Name: John Bailey. Date of Service: 07/28/2017 8:45 AM Medical Record Number: [id number] Patient Account Number: [id number] Date of Birth/Sex: 09-May-1970 (47 y.o. M) Treating RN: Cornell Barman Primary Care Provider: Gala Romney Other Clinician: Referring Provider: Gala Romney Treating Provider/Extender: Tito Dine in Treatment: 1 Subjective History of Present Illness (HPI) 07/21/17; this is a 47 year old patient I actually know from a stay in our [facility] clinic ending earlier this year. At the time I had looked after him largely for a wound on the left lateral leg in the setting of lymphedema, chronic renal insufficiency and congestive heart failure.we did not really control his swelling and had trouble with the wound closure because of this. Ultimately the patient stopped coming on 08/17/99 I'm not certain whether he was healed at that point or not. In any case he was admitted to hospital on 05/13/17 with acute congestive heart failure, radial renal syndrome. He had dialysis initiated. At that point in time he was noted to have a pressure ulcer in the sacrum. He had surgical debridements on this area on 3/22 and 4/1 including an IandD of a right gluteal abscess.  He was readmitted to hospital from 3/26 through 4/21 with an infected sacral decubitus with A. fib/flutter and sacral osteomyelitis although I don't really see the documentation of the osteomyelitis. The CT scan done of the lumbar spine did not show this although it did not really look at the  area specifically. A culture of something on 06/14/17 showed Morganella morganii. He has been on [place] at dialysis since then scheduled and on 07/25/17. He is currently at [facility] skilled facility. They're using Dak[employer] wet to dry change daily. Per the patient and his brother there is been a remarkable improvement in the condition of the wound surface. 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 fib/flutter possibly on Eliquis 07/28/17; the facility has applied a wound VAC to the wounds which are being changed 3 times a week. I am not familiar with the brand however both of the major areas over the left ischial tuberosity and the sacrum look a lot better with healthy granulation tissue. I also think the undermining/tunneling areas are contracting. Objective Constitutional Sitting or standing Blood Pressure is within target range for patient.. Pulse regular and within target range for patient.[name] Respirations regular, non-labored and within target range.. Temperature is normal and within the target range for the patient.[name] appears in no distress. Vitals Time Taken: 8:42 AM, Height: 71 in, Weight: 263 lbs, BMI: 36.7, Temperature: 97.5 F, Pulse: 81 bpm, Respiratory Rate: 18 breaths/min, Blood Pressure: 122/76 mmHg. Eyes Conjunctivae clear. No  discharge. Respiratory Respiratory effort is easy and symmetric bilaterally. Rate is normal at rest and on room air.. Bilateral breath sounds are clear John Bailey, John S. ([id number]) and equal in all lobes with no wheezes, rales or rhonchi.. Cardiovascular good edema control now on dialysis. Psychiatric No evidence of depression, anxiety, or agitation. Calm, cooperative, and communicative. Appropriate interactions and affect.. General Notes: when exam; the patient has a large stage IV wound over the right ischial tuberosity. The tunneling from 1- 4:00 is still evident but I think it is contracted somewhat. There is no palpable bone. Small wound on the right lower buttock looks about 50% epithelialized Sacrum again a large wound with tunneling at 4:00 I think this is contracted as well. The major wounds have very healthy looking granulation, obviously responding to the wound VAC. There is no evidence of soft tissue infection no palpable crepitus Integumentary (Hair, Skin) chronic venous insufficiency in the lower extremities with lymphedema but this is a lot better than the last time I had seen this man. No evidence of infection around his wounds. Wound #1 status is Open. Original cause of wound was Pressure Injury. The wound is located on the Sacrum. The wound measures 6.1cm length x 2.5cm width x 5.3cm depth; 11.977cm^2 area and 63.48cm^3 volume. There is bone, muscle, Fat Layer (Subcutaneous Tissue) Exposed, and fascia exposed. There is no tunneling or undermining noted. There is a large amount of serosanguineous drainage noted. The wound margin is distinct with the outline attached to the wound base. There is large (67-100%) red granulation within the wound bed. There is no necrotic tissue within the wound bed. Periwound temperature was noted as No Abnormality. The periwound has tenderness on palpation. Wound #2 status is Open. Original cause of wound was Pressure Injury. The wound is  located on the Right,Proximal Gluteus. The wound measures 10cm length x 2.8cm width x 2.7cm depth; 21.991cm^2 area and 59.376cm^3 volume. There is muscle, Fat Layer (Subcutaneous Tissue) Exposed, and fascia exposed.  There is no tunneling or undermining noted. There is a large amount of serosanguineous drainage noted. The wound margin is distinct with the outline attached to the wound base. There is large (67-100%) red granulation within the wound bed. Periwound temperature was noted as No Abnormality. The periwound has tenderness on palpation. Wound #3 status is Open. Original cause of wound was Pressure Injury. The wound is located on the Right,Distal Gluteus. The wound measures 0.7cm length x 1cm width x 0.2cm depth; 0.55cm^2 area and 0.11cm^3 volume. There is Fat Layer (Subcutaneous Tissue) Exposed exposed. There is no tunneling or undermining noted. There is a large amount of serosanguineous drainage noted. The wound margin is distinct with the outline attached to the wound base. There is large (67-100%) red granulation within the wound bed. There is a small (1-33%) amount of necrotic tissue within the wound bed including Adherent Slough. The periwound skin appearance did not exhibit: Callus, Crepitus, Excoriation, Induration, Rash, Scarring, Dry/Scaly, Maceration, Atrophie Blanche, Cyanosis, Ecchymosis, Hemosiderin Staining, Mottled, Pallor, Rubor, Erythema. Periwound temperature was noted as No Abnormality. The periwound has tenderness on palpation. Assessment Active Problems ICD-10 L89.154 - Pressure ulcer of sacral region, stage 4 L89.304 - Pressure ulcer of unspecified buttock, stage 4 L89.303 - Pressure ulcer of unspecified buttock, stage 3 John Bailey, John S. ([id number]) Plan Wound Cleansing: Wound #1 Sacrum: Clean wound with Normal Saline. Wound #2 Right,Proximal Gluteus: Clean wound with Normal Saline. Wound #3 Right,Distal Gluteus: Clean wound with Normal Saline. Anesthetic  (add to Medication List): Wound #1 Sacrum: Topical Lidocaine 4% cream applied to wound bed prior to debridement (In Clinic Only). Wound #2 Right,Proximal Gluteus: Topical Lidocaine 4% cream applied to wound bed prior to debridement (In Clinic Only). Wound #3 Right,Distal Gluteus: Topical Lidocaine 4% cream applied to wound bed prior to debridement (In Clinic Only). Primary Wound Dressing: Wound #1 Sacrum: Other: - wet to dry until back at SNF. Wound #2 Right,Proximal Gluteus: Other: - wet to dry until back at SNF. Wound #3 Right,Distal Gluteus: Other: - wet to dry until back at SNF. Secondary Dressing: Wound #1 Sacrum: ABD pad Wound #2 Right,Proximal Gluteus: ABD pad Wound #3 Right,Distal Gluteus: ABD pad Dressing Change Frequency: Wound #1 Sacrum: Three times weekly Wound #2 Right,Proximal Gluteus: Three times weekly Wound #3 Right,Distal Gluteus: Three times weekly Follow-up Appointments: Wound #1 Sacrum: Return Appointment in 2 weeks. Wound #2 Right,Proximal Gluteus: Return Appointment in 2 weeks. Wound #3 Right,Distal Gluteus: Return Appointment in 2 weeks. Off-Loading: Wound #1 Sacrum: Turn and reposition every 2 hours - Keep pressure off of wounded areas Wound #2 Right,Proximal Gluteus: Turn and reposition every 2 hours - Keep pressure off of wounded areas Wound #3 Right,Distal Gluteus: Turn and reposition every 2 hours - Keep pressure off of wounded areas Negative Pressure Wound Therapy: Wound #1 Sacrum: Wound VAC settings at 125/130 mmHg continuous pressure. Use BLACK/GREEN foam to wound cavity. Use WHITE foam to fill any tunnel/s and/or undermining. Change VAC dressing 2 X WEEK. Change canister as indicated when full. Nurse may titrate settings and frequency of dressing changes as clinically indicated. - Silver Collagen under [place] Nurse may d/c VAC for s/s of increased infection, significant wound regression, or uncontrolled drainage. [place] at [phone]. Apply contact layer over base of wound. - Contact layer over bone John Bailey, John S. ([id number]) Wound #2 Right,Proximal Gluteus: Wound VAC settings at 125/130 mmHg continuous pressure. Use BLACK/GREEN foam to wound cavity. Use WHITE foam to fill any tunnel/s and/or undermining.  Change VAC dressing 2 X WEEK. Change canister as indicated when full. Nurse may titrate settings and frequency of dressing changes as clinically indicated. - Silver Collagen under [place] Nurse may d/c VAC for s/s of increased infection, significant wound regression, or uncontrolled drainage. [place] at [phone]. Apply contact layer over base of wound. - Contact layer over bone Wound #3 Right,Distal Gluteus: Wound VAC settings at 125/130 mmHg continuous pressure. Use BLACK/GREEN foam to wound cavity. Use WHITE foam to fill any tunnel/s and/or undermining. Change VAC dressing 2 X WEEK. Change canister as indicated when full. Nurse may titrate settings and frequency of dressing changes as clinically indicated. - Silver Collagen under [place] Nurse may d/c VAC for s/s of increased infection, significant wound regression, or uncontrolled drainage. [place] at [phone]. Apply contact layer over base of wound. - Contact layer over bone #1 we will continue with the wound VAC to the 2 major areas over the sacrum and a right buttock. #2 small area distally on the right buttock continuous over collagen #3 and think this patient can be followed in 2 weeks. Quite a dramatic improvement this week for only one week of wound VAC Electronic Signature(s) Signed: 07/28/2017 4:50:03 PM By: Linton Ham MD Entered By: Linton Ham on 07/28/2017 10:45:37 John Bailey, John Bailey ([id number]) -------------------------------------------------------------------------------- SuperBill Details Patient Name: John Bailey. Date of Service:  07/28/2017 Medical Record Number: [id number] Patient Account Number: [id number] Date of Birth/Sex: 1970-11-25 (47 y.o. M) Treating RN: Cornell Barman Primary Care Provider: Gala Romney Other Clinician: Referring Provider: Gala Romney Treating Provider/Extender: Tito Dine in Treatment: 1 Diagnosis Coding ICD-10 Codes Code Description L89.154 Pressure ulcer of sacral region, stage 4 L89.304 Pressure ulcer of unspecified buttock, stage 4 L89.303 Pressure ulcer of unspecified buttock, stage 3 Facility Procedures CPT4 Code: [id number] Description: 99213 - WOUND CARE VISIT-LEV 3 EST PT Modifier: Quantity: 1 Physician Procedures CPT4 Code: [id number] Description: 32951 - WC PHYS LEVEL 3 - EST PT ICD-10 Diagnosis Description L89.154 Pressure ulcer of sacral region, stage 4 L89.304 Pressure ulcer of unspecified buttock, stage 4 L89.303 Pressure ulcer of unspecified buttock, stage 3 Modifier: Quantity: 1 Electronic Signature(s) Signed: 07/28/2017 4:50:03 PM By: Linton Ham MD Entered By: Linton Ham on 07/28/2017 10:46:07

## 2017-08-02 ENCOUNTER — Encounter: Payer: Self-pay | Admitting: Family

## 2017-08-02 ENCOUNTER — Ambulatory Visit (INDEPENDENT_AMBULATORY_CARE_PROVIDER_SITE_OTHER): Payer: BLUE CROSS/BLUE SHIELD | Admitting: Family

## 2017-08-02 VITALS — BP 112/76 | HR 102 | Temp 98.1°F | Ht 71.0 in | Wt 250.8 lb

## 2017-08-02 DIAGNOSIS — M4628 Osteomyelitis of vertebra, sacral and sacrococcygeal region: Secondary | ICD-10-CM | POA: Diagnosis not present

## 2017-08-02 NOTE — Patient Instructions (Signed)
Nice to see you!  Continue moving in the right direction!  No further antibiotics are needed at this time.  We will check your inflammatory markers today (C-reactive and Erythrocyte Sedimentation Rate)  We will plan follow up as needed.

## 2017-08-02 NOTE — Progress Notes (Addendum)
Subjective:    Patient ID: John Bailey, male    DOB: [birth year], 47 y.o.   MRN: [id number]  Chief Complaint  Patient presents with  . Osteomyelitis     HPI:  John Bailey is a 47 y.o. male who presents today for initial office visit following hospitalization.   Mr. 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 He was placed on Ceftazidime for ease of function with his dialysis. Goal end of therapy was 07/25/17 which was 6 weeks from initiation of therapy. Discharged to [facility] for rehabilitation. He continues to be followed by Dr. Dellia Nims of [place] and currently has a wound VAC on his sacral wound. Per notes there appears to be new healing granulation tissue with this regimen. All hospital records, labs and imaging were reviewed in detail.  Continues to reside at [facility] for rehabilitation notes that he has made positive progress in wound healing and is feeling better. Denies any fevers, chills or night sweats. Wound vac canister was changed 1 day ago and has not had any discharge. States he has been walking and moving around the best that he can. He has completed the course of ceftazidime as scheduled with no adverse side effects.  No Known Allergies    Outpatient Medications Prior to Visit  Medication Sig Dispense Refill  . amiodarone (PACERONE) 200 MG tablet Take 1 tablet (200 mg total) by mouth daily. 30 tablet 0  . apixaban (ELIQUIS) 5 MG TABS tablet Take 1 tablet (5 mg total) by mouth 2 (two) times daily. 60 tablet 0  . calcitRIOL (ROCALTROL) 0.5 MCG capsule Take 1 capsule (0.5 mcg total) by mouth every Tuesday, Thursday, and Saturday at 6 PM. 90 capsule 0  . carvedilol (COREG) 6.25 MG tablet Take 1 tablet (6.25 mg total) by mouth 2 (two) times daily with a meal. 60 tablet 0  . collagenase (SANTYL) ointment Apply topically  daily.    . diclofenac sodium (VOLTAREN) 1 % GEL Apply 4 g topically 2 (two) times daily as needed (apply over affected area).    . feeding supplement, GLUCERNA SHAKE, (GLUCERNA SHAKE) LIQD Take 237 mLs by mouth 3 (three) times daily between meals.  0  . insulin glargine (LANTUS) 100 UNIT/ML injection Inject 0.15 mLs (15 Units total) into the skin at bedtime.    [name] (FOSRENOL) 500 MG chewable tablet Chew 0.5 tablets (250 mg total) by mouth 3 (three) times daily with meals.    [name] NOVOLOG FLEXPEN 100 UNIT/ML FlexPen Inject as directed as directed.  0  . predniSONE (DELTASONE) 10 MG tablet Take 1 tablet (10 mg total) by mouth daily with breakfast. Last dose 4/25    . cefTAZidime 2 g in sodium chloride 0.9 % 100 mL Inject 2 g into the vein Every Tuesday,Thursday,and Saturday with dialysis. Continue ceftazidime for osteomyelitis, sacral wound, antibiotic stop date 07/25/17.  Needs weekly CBC with differential.  Fax weekly labs to [phone]     No facility-administered medications prior to visit.      Past Medical History:  Diagnosis Date  . Atrial flutter with rapid ventricular response ([facility]) 08/06/2015   a. s/p DCCV in 07/2015 with recurrent atrial fibrillation and DCCV in 12/2016. Initially successful but noted to be back in atrial fibrillation within 2 weeks of DCCV.   [name] Chronic diastolic (congestive) heart failure ([facility])    a.  EF 50-55% by echo in 06/2016.  . Diabetes ([place])   . Dysrhythmia    Aflutter  . ESRD (end stage renal disease) on dialysis ([place])    "TTS; at [facility]" (06/08/2017)  . Hypertension   . Peripheral vascular disease ([place])   . Rheumatoid arthritis [facility])       Past Surgical History:  Procedure Laterality Date  . AV FISTULA PLACEMENT Left 05/10/2017   Procedure: CREATION of Left Radicephalic Fistula;  Surgeon: Rosetta Posner, MD;  Location: [place];  Service: Vascular;  Laterality: Left;  . CARDIOVERSION N/A 08/01/2015   Procedure: CARDIOVERSION;  Surgeon: Josue Hector, MD;  Location: [facility] ENDOSCOPY;  Service: Cardiovascular;  Laterality: N/A;  . CARDIOVERSION N/A 12/18/2016   Procedure: CARDIOVERSION;  Surgeon: [name], MD;  Location: [facility] ENDOSCOPY;  Service: Cardiovascular;  Laterality: N/A;  . CARDIOVERSION N/A 04/30/2017   Procedure: CARDIOVERSION;  Surgeon: Larey Dresser, MD;  Location: [facility] ENDOSCOPY;  Service: Cardiovascular;  Laterality: N/A;  . CHOLECYSTECTOMY    . DEBRIDMENT OF DECUBITUS ULCER N/A 06/04/2017   Procedure: DEBRIDMENT OF SACRAL DECUBITUS ULCER;  Surgeon: Georganna Skeans, MD;  Location: [place];  Service: General;  Laterality: N/A;  . INCISION AND DRAINAGE ABSCESS N/A 06/14/2017   Procedure: INCISION AND DRAINAGE GLUTEAL ABSCESS;  Surgeon: Kieth Brightly Arta Bruce, MD;  Location: [place];  Service: General;  Laterality: N/A;  Packing to coccyx with packing and drain to right buttock  . INSERTION OF DIALYSIS CATHETER Right 05/04/2017   Procedure: INSERTION OF TUNNELED  DIALYSIS CATHETER;  Surgeon: Conrad Deary, MD;  Location: [place];  Service: Vascular;  Laterality: Right;  . IR FLUORO GUIDE CV LINE RIGHT  04/27/2017  . IR US GUIDE VASC ACCESS RIGHT  04/27/2017  . TEE WITHOUT CARDIOVERSION N/A 08/01/2015   Procedure: TRANSESOPHAGEAL ECHOCARDIOGRAM (TEE);  Surgeon: Josue Hector, MD;  Location: [facility] ENDOSCOPY;  Service: Cardiovascular;  Laterality: N/A;      Family History  Problem Relation Age of Onset  . Heart disease Mother   . Hyperlipidemia Father   . Hypertension Father       Social History   Socioeconomic History  . Marital status: Married    Spouse name: Not on file  . Number of children: Not on file  . Years of education: Not on file  . Highest education level: Not on file  Occupational History  . Not on file  Social Needs  . Financial resource strain: Not on file  . Food insecurity:    Worry: Not on file    Inability: Not on file  . Transportation needs:    Medical: Not on file    Non-medical: Not on file    Tobacco Use  . Smoking status: Never Smoker  . Smokeless tobacco: Never Used  Substance and Sexual Activity  . Alcohol use: No  . Drug use: No  . Sexual activity: Yes  Lifestyle  . Physical activity:    Days per week: Not on file    Minutes per session: Not on file  . Stress: Not on file  Relationships  . Social connections:    Talks on phone: Not on file    Gets together: Not on file    Attends religious service: Not on file    Active member of club or organization: Not on file    Attends meetings of clubs or organizations: Not on file    Relationship status: Not on file  . Intimate partner violence:  Fear of current or ex partner: Not on file    Emotionally abused: Not on file    Physically abused: Not on file    Forced sexual activity: Not on file  Other Topics Concern  . Not on file  Social History Narrative  . Not on file      Review of Systems  Constitutional: Negative for chills, fatigue, fever and unexpected weight change.  Respiratory: Negative for cough, chest tightness, shortness of breath and wheezing.   Cardiovascular: Negative for chest pain.  Musculoskeletal:       Positive for sacral pain  Skin: Positive for wound.  Psychiatric/Behavioral: Negative for agitation.       Objective:    BP 112/76   Pulse (!) 102   Temp 98.1 F (36.7 C)   Ht 5\' 11"  (1.803 m)   Wt 250 lb 12.8 oz (113.8 kg)   SpO2 92%   BMI 34.98 kg/m  Nursing note and vital signs reviewed.  Physical Exam  Constitutional: He is oriented to person, place, and time. He appears well-developed and well-nourished. No distress.  Seated in the wheelchair; pleasant.   Cardiovascular: Regular rhythm and intact distal pulses. Tachycardia present. Exam reveals no gallop and no friction rub.  Murmur heard. Pulmonary/Chest: Effort normal and breath sounds normal. No stridor. No respiratory distress. He has no wheezes. He has no rales. He exhibits no tenderness.  Neurological: He is alert  and oriented to person, place, and time.  Skin: Skin is warm and dry.  Wound vac in place and functional. Dressing is clear, dry and intact. There is no drainage.   Psychiatric: He has a normal mood and affect. His behavior is normal.        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 Capote has been diagnosed with Morganella osteomyelitis and has completed 6 weeks of therapy with Ceftazidime. Wound care and patient information indicate improved healing of wound now using wound VAC. Check inflammatory markers today. Monitor off antibiotics at this time.Continue to follow up with Dr. Dellia Nims for wound care. Healing continues to be complicated secondary to multiple co-morbid conditions. Follow up with ID if symptoms return or if inflammatory markers remain elevated from previous.       Relevant Orders   C-reactive protein (Completed)   Sedimentation rate (Completed)      I have discontinued [name] "[name]"'s cefTAZidime 2 g in sodium chloride 0.9 % 100 mL. I am also having him maintain his amiodarone, carvedilol, apixaban, calcitRIOL, insulin glargine, predniSONE, lanthanum, feeding supplement (GLUCERNA SHAKE), collagenase, diclofenac sodium, and NOVOLOG FLEXPEN.   Follow-up: Pending inflammatory markers as needed.    Mauricio Po, [facility]

## 2017-08-02 NOTE — Assessment & Plan Note (Addendum)
[name] has been diagnosed with Morganella osteomyelitis and has completed 6 weeks of therapy with Ceftazidime. Wound care and patient information indicate improved healing of wound now using wound VAC. Check inflammatory markers today. Monitor off antibiotics at this time.Continue to follow up with Dr. Dellia Nims for wound care. Healing continues to be complicated secondary to multiple co-morbid conditions. Follow up with ID if symptoms return or if inflammatory markers remain elevated from previous.

## 2017-08-03 LAB — C-REACTIVE PROTEIN: CRP: 104.2 mg/L — AB (ref <8.0)

## 2017-08-03 LAB — SEDIMENTATION RATE

## 2017-08-06 ENCOUNTER — Telehealth: Payer: Self-pay

## 2017-08-06 NOTE — Telephone Encounter (Addendum)
Orders faxed to dialysis for pt to begin IV Ceftazidime 2g iv every Tuesday, Thursday, and  Saturday. Current stop date is 08/21/17 Fax: [place], [place]

## 2017-08-10 ENCOUNTER — Encounter (HOSPITAL_COMMUNITY): Payer: BLUE CROSS/BLUE SHIELD

## 2017-08-10 NOTE — Progress Notes (Deleted)
  POST OPERATIVE OFFICE NOTE    CC:  F/u for surgery  HPI:  This is a 47 y.o. male who is s/p left radiocephalic av fistula creation 05/10/2017.  She returns for evaluation of the fistula with fistula duplex.    No Known Allergies  Current Outpatient Medications  Medication Sig Dispense Refill  . amiodarone (PACERONE) 200 MG tablet Take 1 tablet (200 mg total) by mouth daily. 30 tablet 0  . apixaban (ELIQUIS) 5 MG TABS tablet Take 1 tablet (5 mg total) by mouth 2 (two) times daily. 60 tablet 0  . calcitRIOL (ROCALTROL) 0.5 MCG capsule Take 1 capsule (0.5 mcg total) by mouth every Tuesday, Thursday, and Saturday at 6 PM. 90 capsule 0  . carvedilol (COREG) 6.25 MG tablet Take 1 tablet (6.25 mg total) by mouth 2 (two) times daily with a meal. 60 tablet 0  . collagenase (SANTYL) ointment Apply topically daily.    . diclofenac sodium (VOLTAREN) 1 % GEL Apply 4 g topically 2 (two) times daily as needed (apply over affected area).    . feeding supplement, GLUCERNA SHAKE, (GLUCERNA SHAKE) LIQD Take 237 mLs by mouth 3 (three) times daily between meals.  0  . insulin glargine (LANTUS) 100 UNIT/ML injection Inject 0.15 mLs (15 Units total) into the skin at bedtime.    [name] (FOSRENOL) 500 MG chewable tablet Chew 0.5 tablets (250 mg total) by mouth 3 (three) times daily with meals.    [name] NOVOLOG FLEXPEN 100 UNIT/ML FlexPen Inject as directed as directed.  0  . predniSONE (DELTASONE) 10 MG tablet Take 1 tablet (10 mg total) by mouth daily with breakfast. Last dose 4/25     No current facility-administered medications for this visit.      ROS:  See HPI  Physical Exam:  There were no vitals filed for this visit.  Incision:  *** Extremities:  *** Neuro: *** Abdomen:  ***  Assessment/Plan:  This is a 47 y.o. male who is s/p: Left radiocephalic av fistula created 05/10/2017.    -***   [name], PA-C [facility] [phone]  Clinic MD:  ***

## 2017-08-11 ENCOUNTER — Encounter: Payer: BLUE CROSS/BLUE SHIELD | Admitting: Internal Medicine

## 2017-08-11 DIAGNOSIS — L89314 Pressure ulcer of right buttock, stage 4: Secondary | ICD-10-CM | POA: Diagnosis not present

## 2017-08-13 NOTE — Progress Notes (Signed)
JADE, John Bailey ([id number]) Visit Report for 08/11/2017 HPI Details Patient Name: John Bailey, John Bailey. Date of Service: 08/11/2017 8:45 AM Medical Record Number: [id number] Patient Account Number: [id number] Date of Birth/Sex: 12/08/1970 (47 y.o. M) Treating RN: Cornell Barman Primary Care Provider: Gala Romney Other Clinician: Referring Provider: Gala Romney Treating Provider/Extender: Tito Dine in Treatment: 3 History of Present Illness HPI Description: 07/21/17; this is a 47 year old patient I actually know from a stay in our [facility] clinic ending earlier this year. At the time I had looked after him largely for a wound on the left lateral leg in the setting of lymphedema, chronic renal insufficiency and congestive heart failure.we did not really control his swelling and had trouble with the wound closure because of this. Ultimately the patient stopped coming on 04/19/38 I'm not certain whether he was healed at that point or not. In any case he was admitted to hospital on 05/13/17 with acute congestive heart failure, radial renal syndrome. He had dialysis initiated. At that point in time he was noted to have a pressure ulcer in the sacrum. He had surgical debridements on this area on 3/22 and 4/1 including an IandD of a right gluteal abscess. He was readmitted to hospital from 3/26 through 4/21 with an infected sacral decubitus with A. fib/flutter and sacral osteomyelitis although I don't really see the documentation of the osteomyelitis. The CT scan done of the lumbar spine did not show this although it did not really look at the area specifically. A culture of something on 06/14/17 showed Morganella morganii. He has been on [place] at dialysis since then scheduled and on 07/25/17. He is currently at [facility] skilled facility. They're using Dakin's wet to dry change daily. Per the patient and his brother there is been a remarkable improvement in the condition of the wound  surface. 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 fib/flutter possibly on Eliquis 07/28/17; the facility has applied a wound VAC to the wounds which are being changed 3 times a week. I am not familiar with the brand however both of the major areas over the right ischial tuberosity and the sacrum look a lot better with healthy granulation tissue. I also think the undermining/tunneling areas are contracting. 08/11/17; the patient is back in his own home in [place]. He has [facility] and I think a [employer] wound VAC now. He has a large area over the sacrum and a large area over the right ischial tuberosity. The ischial tuberosity has 3 tunneling areas I don't have a good sense of whether these are better. This sacral wound is precariously close to bone. The patient tells me he is off his wounds most of the day except when he has dialysis 3 times a week and they're he tries to shift his weight in his wheelchair. Electronic Signature(s) Signed: 08/12/2017 8:24:56 AM By: Linton Ham MD Entered By: Linton Ham on 08/11/2017 10:16:31 John Bailey ([id number]) -------------------------------------------------------------------------------- Physical Exam Details Patient Name: John Bailey, John Bailey Date of Service: 08/11/2017 8:45 AM Medical Record Number: [id number] Patient Account Number: [id number] Date of Birth/Sex: Mar 09, 1971 (47 y.o. M) Treating RN: Cornell Barman Primary Care Provider:  Gala Romney Other Clinician: Referring Provider: Gala Romney Treating Provider/Extender: Tito Dine in Treatment:  3 Constitutional Sitting or standing Blood Pressure is within target range for patient.. Pulse regular and within target range for patient.[name] Respirations regular, non-labored and within target range.. Temperature is normal and within the target range for the patient.[name] appears in no distress. Appears systemically well. Eyes Conjunctivae clear. No discharge. Respiratory Respiratory effort is easy and symmetric bilaterally. Rate is normal at rest and on room air.[name] Psychiatric No evidence of depression, anxiety, or agitation. Calm, cooperative, and communicative. Appropriate interactions and affect.. Notes wound exam oThe patient has a large stage IV wound over the right ischial tuberosity. There is tunneling in 3 separate areas. Surface of this however looks stable and I think the wound surface is contracted. I'm not really totally certain about the tunneling areas. There is no palpable bone oHe has a small wound on the right lower buttock oThe sacral wound is a large open area that is contracted as well. This does go close to the underlying lower sacral area. The granulation looks healthy. No debridement in any of these areas. There is no evidence of soft tissue infection in any of the wound area Electronic Signature(s) Signed: 08/12/2017 8:24:56 AM By: Linton Ham MD Entered By: Linton Ham on 08/11/2017 10:18:58 John Bailey ([id number]) -------------------------------------------------------------------------------- Physician Orders Details Patient Name: John Bailey. Date of Service: 08/11/2017 8:45 AM Medical Record Number: [id number] Patient Account Number: [id number] Date of Birth/Sex: 1970/04/12 (47 y.o. M) Treating RN: Cornell Barman Primary Care Provider: Gala Romney Other Clinician: Referring Provider: Gala Romney Treating Provider/Extender: Tito Dine in Treatment: 3 Verbal / Phone Orders: No Diagnosis Coding Wound Cleansing Wound #1  Sacrum o Clean wound with Normal Saline. Wound #2 Right,Proximal Gluteus o Clean wound with Normal Saline. Anesthetic (add to Medication List) Wound #1 Sacrum o Topical Lidocaine 4% cream applied to wound bed prior to debridement (In Clinic Only). Wound #2 Right,Proximal Gluteus o Topical Lidocaine 4% cream applied to wound bed prior to debridement (In Clinic Only). Skin Barriers/Peri-Wound Care Wound #1 Sacrum o Skin Prep Wound #2 Right,Proximal Gluteus o Skin Prep Primary Wound Dressing Wound #1 Sacrum o Other: - wet to dry in clinic Wound #2 Right,Proximal Gluteus o Other: - wet to dry in clinic Secondary Dressing Wound #1 Sacrum o ABD pad - in clinic Wound #2 Right,Proximal Gluteus o ABD pad - in clinic Dressing Change Frequency Wound #1 Sacrum o Change Dressing Monday, Wednesday, Friday Wound #2 Right,Proximal Gluteus o Change Dressing Monday, Wednesday, Friday Follow-up Appointments John Bailey, John S. ([id number]) Wound #1 Sacrum o Return Appointment in 2 weeks. Wound #2 Right,Proximal Gluteus o Return Appointment in 2 weeks. Off-Loading Wound #1 Sacrum o Roho cushion for wheelchair - [facility] to order for patient (hospital picked up the one he had). o Turn and reposition every 2 hours - Keep pressure off of wounded areas Wound #2 Right,Proximal Gluteus o Roho cushion for wheelchair - [facility] to order for patient (hospital picked up the one he had). o Turn and reposition every 2 hours - Keep pressure off of wounded areas Mounds #1 [place] Visits - [place] Nurse may visit PRN to address patientos wound care needs. o FACE TO FACE ENCOUNTER: MEDICARE and MEDICAID PATIENTS: I certify that this patient is under my care and that I had a face-to-face encounter that meets the physician face-to-face encounter requirements with this patient  on this date. The encounter  with the patient was in whole or in part for the following MEDICAL CONDITION: (primary reason for [place]) MEDICAL NECESSITY: I certify, that based on my findings, NURSING services are a medically necessary home health service. HOME BOUND STATUS: I certify that my clinical findings support that this patient is homebound (i.e., Due to illness or injury, pt requires aid of supportive devices such as crutches, cane, wheelchairs, walkers, the use of special transportation or the assistance of another person to leave their place of residence. There is a normal inability to leave the home and doing so requires considerable and taxing effort. Other absences are for medical reasons / religious services and are infrequent or of short duration when for other reasons). o If current dressing causes regression in wound condition, may D/C ordered dressing product/s and apply Normal Saline Moist Dressing daily until next [place] / Other MD appointment. [place] of regression in wound condition at [phone]. o Please direct any NON-WOUND related issues/requests for orders to patient's Primary Care Physician Wound #2 [place] Visits - [place] Nurse may visit PRN to address patientos wound care needs. o FACE TO FACE ENCOUNTER: MEDICARE and MEDICAID PATIENTS: I certify that this patient is under my care and that I had a face-to-face encounter that meets the physician face-to-face encounter requirements with this patient on this date. The encounter with the patient was in whole or in part for the following MEDICAL CONDITION: (primary reason for [place]) MEDICAL NECESSITY: I certify, that based on my findings, NURSING services are a medically necessary home health service. HOME BOUND STATUS: I certify that my clinical findings support that this patient is homebound (i.e., Due to illness or injury, pt  requires aid of supportive devices such as crutches, cane, wheelchairs, walkers, the use of special transportation or the assistance of another person to leave their place of residence. There is a normal inability to leave the home and doing so requires considerable and taxing effort. Other absences are for medical reasons / religious services and are infrequent or of short duration when for other reasons). o If current dressing causes regression in wound condition, may D/C ordered dressing product/s and apply Normal Saline Moist Dressing daily until next [place] / Other MD appointment. [place] of regression in wound condition at [phone]. o Please direct any NON-WOUND related issues/requests for orders to patient's Primary Care Physician Negative Pressure Wound Therapy Wound #1 Sacrum o Wound VAC settings at 125/130 mmHg continuous pressure. Use BLACK/GREEN foam to wound cavity. Use WHITE foam to fill any tunnel/s and/or undermining. Change VAC dressing 2 X WEEK. Change canister as YOUSSOUF, John Bailey. ([id number]) indicated when full. Nurse may titrate settings and frequency of dressing changes as clinically indicated. - Silver Collagen under [place] Nurse may d/c VAC for s/s of increased infection, significant wound regression, or uncontrolled drainage. [place] at [phone]. o Apply contact layer over base of wound. - Contact layer over bone Wound #2 Right,Proximal Gluteus o Wound VAC settings at 125/130 mmHg continuous pressure. Use BLACK/GREEN foam to wound cavity. Use WHITE foam to fill any tunnel/s and/or undermining. Change VAC dressing 2 X WEEK. Change canister as indicated when full. Nurse may titrate settings and frequency of dressing changes as clinically indicated. - Silver Collagen under [place] Nurse may d/c VAC for s/s of increased infection, significant  wound regression, or  uncontrolled drainage. [place] at [phone]. o Apply contact layer over base of wound. - Contact layer over bone Electronic Signature(s) Signed: 08/11/2017 5:21:30 PM By: Gretta Cool, BSN, RN, CWS, Kim RN, BSN Signed: 08/12/2017 8:24:56 AM By: Linton Ham MD Entered By: Gretta Cool, BSN, RN, CWS, Kim on 08/11/2017 09:39:34 John Bailey, John Bailey ([id number]) -------------------------------------------------------------------------------- Problem List Details Patient Name: KOSTON, HENNES. Date of Service: 08/11/2017 8:45 AM Medical Record Number: [id number] Patient Account Number: [id number] Date of Birth/Sex: 01/11/1971 (47 y.o. M) Treating RN: Cornell Barman Primary Care Provider: Gala Romney Other Clinician: Referring Provider: Gala Romney Treating Provider/Extender: Tito Dine in Treatment: 3 Active Problems ICD-10 Impacting Encounter Code Description Active Date Wound Healing Diagnosis L89.154 Pressure ulcer of sacral region, stage 4 07/21/2017 Yes L89.304 Pressure ulcer of unspecified buttock, stage 4 07/21/2017 Yes L89.303 Pressure ulcer of unspecified buttock, stage 3 07/21/2017 Yes Inactive Problems Resolved Problems Electronic Signature(s) Signed: 08/12/2017 8:24:56 AM By: Linton Ham MD Entered By: Linton Ham on 08/11/2017 10:13:10 John Bailey ([id number]) -------------------------------------------------------------------------------- Progress Note Details Patient Name: John Bailey. Date of Service: 08/11/2017 8:45 AM Medical Record Number: [id number] Patient Account Number: [id number] Date of Birth/Sex: 1970-10-03 (47 y.o. M) Treating RN: Cornell Barman Primary Care Provider: Gala Romney Other Clinician: Referring Provider: Gala Romney Treating Provider/Extender: Tito Dine in Treatment: 3 Subjective History of Present Illness (HPI) 07/21/17; this is a 47 year old patient I actually know from a stay in our  [facility] clinic ending earlier this year. At the time I had looked after him largely for a wound on the left lateral leg in the setting of lymphedema, chronic renal insufficiency and congestive heart failure.we did not really control his swelling and had trouble with the wound closure because of this. Ultimately the patient stopped coming on 10/20/74 I'm not certain whether he was healed at that point or not. In any case he was admitted to hospital on 05/13/17 with acute congestive heart failure, radial renal syndrome. He had dialysis initiated. At that point in time he was noted to have a pressure ulcer in the sacrum. He had surgical debridements on this area on 3/22 and 4/1 including an IandD of a right gluteal abscess. He was readmitted to hospital from 3/26 through 4/21 with an infected sacral decubitus with A. fib/flutter and sacral osteomyelitis although I don't really see the documentation of the osteomyelitis. The CT scan done of the lumbar spine did not show this although it did not really look at the area specifically. A culture of something on 06/14/17 showed Morganella morganii. He has been on [place] at dialysis since then scheduled and on 07/25/17. He is currently at [facility] skilled facility. They're using Dak[employer]'s wet to dry change daily. Per the patient and his brother there is been a remarkable improvement in the condition of the wound surface. 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 fib/flutter possibly on Eliquis 07/28/17; the facility has applied a wound VAC to the wounds which are being changed 3 times a week. I am not familiar  with the brand however both of the major areas over the right ischial  tuberosity and the sacrum look a lot better with healthy granulation tissue. I also think the undermining/tunneling areas are contracting. 08/11/17; the patient is back in his own home in [place]. He has [facility] and I think a [employer] wound VAC now. He has a large area over the sacrum and a large area over the right ischial tuberosity. The ischial tuberosity has 3 tunneling areas I don't have a good sense of whether these are better. This sacral wound is precariously close to bone. The patient tells me he is off his wounds most of the day except when he has dialysis 3 times a week and they're he tries to shift his weight in his wheelchair. Objective Constitutional Sitting or standing Blood Pressure is within target range for patient.. Pulse regular and within target range for patient.[name] Respirations regular, non-labored and within target range.. Temperature is normal and within the target range for the patient.[name] appears in no distress. Appears systemically well. Vitals Time Taken: 9:03 AM, Height: 71 in, Weight: 263 lbs, BMI: 36.7, Temperature: 97.6 F, Pulse: 84 bpm, Respiratory Rate: 16 breaths/min, Blood Pressure: 129/90 mmHg. John Bailey, John S. ([id number]) Eyes Conjunctivae clear. No discharge. Respiratory Respiratory effort is easy and symmetric bilaterally. Rate is normal at rest and on room air.[name] Psychiatric No evidence of depression, anxiety, or agitation. Calm, cooperative, and communicative. Appropriate interactions and affect.. General Notes: wound exam The patient has a large stage IV wound over the right ischial tuberosity. There is tunneling in 3 separate areas. Surface of this however looks stable and I think the wound surface is contracted. I'm not really totally certain about the tunneling areas. There is no palpable bone He has a small wound on the right lower buttock The sacral wound is a large  open area that is contracted as well. This does go close to the underlying lower sacral area. The granulation looks healthy. No debridement in any of these areas. There is no evidence of soft tissue infection in any of the wound area Integumentary (Hair, Skin) Wound #1 status is Open. Original cause of wound was Pressure Injury. The wound is located on the Sacrum. The wound measures 3.8cm length x 3cm width x 5.2cm depth; 8.954cm^2 area and 46.558cm^3 volume. There is bone, muscle, Fat Layer (Subcutaneous Tissue) Exposed, and fascia exposed. There is no tunneling or undermining noted. There is a large amount of serous drainage noted. The wound margin is distinct with the outline attached to the wound base. There is large (67-100%) red granulation within the wound bed. There is no necrotic tissue within the wound bed. The periwound skin appearance did not exhibit: Callus, Crepitus, Excoriation, Induration, Rash, Scarring, Dry/Scaly, Maceration, Atrophie Blanche, Cyanosis, Ecchymosis, Hemosiderin Staining, Mottled, Pallor, Rubor, Erythema. Periwound temperature was noted as No Abnormality. The periwound has tenderness on palpation. Wound #2 status is Open. Original cause of wound was Pressure Injury. The wound is located on the Right,Proximal Gluteus. The wound measures 8.1cm length x 2.7cm width x 2.5cm depth; 17.177cm^2 area and 42.942cm^3 volume. There is muscle, Fat Layer (Subcutaneous Tissue) Exposed, and fascia exposed. There is no tunneling or undermining noted. There is a large amount of serous drainage noted. The wound margin is distinct with the outline attached to the wound base. There is large (67-100%) red granulation within the wound bed. There is no necrotic tissue within the wound bed. The periwound skin appearance did not exhibit: Callus, Crepitus, Excoriation, Induration, Rash, Scarring, Dry/Scaly, Maceration, Atrophie Blanche, Cyanosis, Ecchymosis, Hemosiderin Staining, Mottled,  Pallor,  Rubor, Erythema. Periwound temperature was noted as No Abnormality. The periwound has tenderness on palpation. Wound #3 status is Healed - Epithelialized. Original cause of wound was Pressure Injury. The wound is located on the Right,Distal Gluteus. The wound measures 0cm length x 0cm width x 0cm depth; 0cm^2 area and 0cm^3 volume. Assessment Active Problems ICD-10 L89.154 - Pressure ulcer of sacral region, stage 4 L89.304 - Pressure ulcer of unspecified buttock, stage 4 L89.303 - Pressure ulcer of unspecified buttock, stage 3 Plan Wound Cleansing: John Bailey, John S. ([id number]) Wound #1 Sacrum: Clean wound with Normal Saline. Wound #2 Right,Proximal Gluteus: Clean wound with Normal Saline. Anesthetic (add to Medication List): Wound #1 Sacrum: Topical Lidocaine 4% cream applied to wound bed prior to debridement (In Clinic Only). Wound #2 Right,Proximal Gluteus: Topical Lidocaine 4% cream applied to wound bed prior to debridement (In Clinic Only). Skin Barriers/Peri-Wound Care: Wound #1 Sacrum: Skin Prep Wound #2 Right,Proximal Gluteus: Skin Prep Primary Wound Dressing: Wound #1 Sacrum: Other: - wet to dry in clinic Wound #2 Right,Proximal Gluteus: Other: - wet to dry in clinic Secondary Dressing: Wound #1 Sacrum: ABD pad - in clinic Wound #2 Right,Proximal Gluteus: ABD pad - in clinic Dressing Change Frequency: Wound #1 Sacrum: Change Dressing Monday, Wednesday, Friday Wound #2 Right,Proximal Gluteus: Change Dressing Monday, Wednesday, Friday Follow-up Appointments: Wound #1 Sacrum: Return Appointment in 2 weeks. Wound #2 Right,Proximal Gluteus: Return Appointment in 2 weeks. Off-Loading: Wound #1 Sacrum: Roho cushion for wheelchair - [facility] to order for patient (hospital picked up the one he had). Turn and reposition every 2 hours - Keep pressure off of wounded areas Wound #2 Right,Proximal Gluteus: Roho cushion for wheelchair - [facility]  to order for patient (hospital picked up the one he had). Turn and reposition every 2 hours - Keep pressure off of wounded areas Home Health: Wound #1 Sacrum: [place] Visits - [place] Nurse may visit PRN to address patient s wound care needs. FACE TO FACE ENCOUNTER: MEDICARE and MEDICAID PATIENTS: I certify that this patient is under my care and that I had a face-to-face encounter that meets the physician face-to-face encounter requirements with this patient on this date. The encounter with the patient was in whole or in part for the following MEDICAL CONDITION: (primary reason for [place]) MEDICAL NECESSITY: I certify, that based on my findings, NURSING services are a medically necessary home health service. HOME BOUND STATUS: I certify that my clinical findings support that this patient is homebound (i.e., Due to illness or injury, pt requires aid of supportive devices such as crutches, cane, wheelchairs, walkers, the use of special transportation or the assistance of another person to leave their place of residence. There is a normal inability to leave the home and doing so requires considerable and taxing effort. Other absences are for medical reasons / religious services and are infrequent or of short duration when for other reasons). If current dressing causes regression in wound condition, may D/C ordered dressing product/s and apply Normal Saline Moist Dressing daily until next [place] / Other MD appointment. [place] of regression in wound condition at [phone]. Please direct any NON-WOUND related issues/requests for orders to patient's Primary Care Physician Wound #2 Right,Proximal Gluteus: [place] Visits - [facility] John Bailey, John Bailey ([id number]) Home Health Nurse may visit PRN to address patient s wound care needs. FACE TO FACE ENCOUNTER: MEDICARE and MEDICAID PATIENTS: I certify that this patient is  under my care  and that I had a face-to-face encounter that meets the physician face-to-face encounter requirements with this patient on this date. The encounter with the patient was in whole or in part for the following MEDICAL CONDITION: (primary reason for [place]) MEDICAL NECESSITY: I certify, that based on my findings, NURSING services are a medically necessary home health service. HOME BOUND STATUS: I certify that my clinical findings support that this patient is homebound (i.e., Due to illness or injury, pt requires aid of supportive devices such as crutches, cane, wheelchairs, walkers, the use of special transportation or the assistance of another person to leave their place of residence. There is a normal inability to leave the home and doing so requires considerable and taxing effort. Other absences are for medical reasons / religious services and are infrequent or of short duration when for other reasons). If current dressing causes regression in wound condition, may D/C ordered dressing product/s and apply Normal Saline Moist Dressing daily until next [place] / Other MD appointment. [place] of regression in wound condition at [phone]. Please direct any NON-WOUND related issues/requests for orders to patient's Primary Care Physician Negative Pressure Wound Therapy: Wound #1 Sacrum: Wound VAC settings at 125/130 mmHg continuous pressure. Use BLACK/GREEN foam to wound cavity. Use WHITE foam to fill any tunnel/s and/or undermining. Change VAC dressing 2 X WEEK. Change canister as indicated when full. Nurse may titrate settings and frequency of dressing changes as clinically indicated. - Silver Collagen under [place] Nurse may d/c VAC for s/s of increased infection, significant wound regression, or uncontrolled drainage. [place] at [phone]. Apply contact layer over base of wound. - Contact layer over bone Wound  #2 Right,Proximal Gluteus: Wound VAC settings at 125/130 mmHg continuous pressure. Use BLACK/GREEN foam to wound cavity. Use WHITE foam to fill any tunnel/s and/or undermining. Change VAC dressing 2 X WEEK. Change canister as indicated when full. Nurse may titrate settings and frequency of dressing changes as clinically indicated. - Silver Collagen under [place] Nurse may d/c VAC for s/s of increased infection, significant wound regression, or uncontrolled drainage. [place] at [phone]. Apply contact layer over base of wound. - Contact layer over bone #1I am going to continue with the wound VAC bridged to both areas. #2 I talked to him about offloading as much as possible. He is limited a bit at dialysis #3 no evidence of soft tissue infection. #4 he was treated for osteomyelitis when this first started although I never did see the exact documentation for this. I think he finished his antibiotics at dialysis earlier this month. #5 he mentions he does not have a proper wheelchair cushion will look into this. He has a gel overlay level 2 surface Electronic Signature(s) Signed: 08/12/2017 8:24:56 AM By: Linton Ham MD Entered By: Linton Ham on 08/11/2017 10:21:48 John Bailey, John Bailey ([id number]) -------------------------------------------------------------------------------- SuperBill Details Patient Name: John Bailey Date of Service: 08/11/2017 Medical Record Number: [id number] Patient Account Number: [id number] Date of Birth/Sex: 1971/02/03 (47 y.o. M) Treating RN: Cornell Barman Primary Care Provider: Gala Romney Other Clinician: Referring Provider: Gala Romney Treating Provider/Extender: Tito Dine in Treatment: 3 Diagnosis Coding ICD-10 Codes Code Description L89.154 Pressure ulcer of sacral region, stage 4 L89.304 Pressure ulcer of unspecified buttock, stage 4 L89.303 Pressure ulcer of unspecified buttock, stage 3 Facility  Procedures CPT4 Code: [id number] Description: 99214 - WOUND CARE VISIT-LEV 4 EST PT Modifier: Quantity: 1 Physician Procedures CPT4  Code: 9672897 Description: 91504 - WC PHYS LEVEL 3 - EST PT ICD-10 Diagnosis Description L89.154 Pressure ulcer of sacral region, stage 4 L89.304 Pressure ulcer of unspecified buttock, stage 4 L89.303 Pressure ulcer of unspecified buttock, stage 3 Modifier: Quantity: 1 Electronic Signature(s) Signed: 08/12/2017 8:24:56 AM By: Linton Ham MD Entered By: Linton Ham on 08/11/2017 10:22:22

## 2017-08-17 NOTE — Progress Notes (Addendum)
DINA, MOBLEY ([id number]) Visit Report for 08/11/2017 Arrival Information Details Patient Name: John Bailey, John Bailey. Date of Service: 08/11/2017 8:45 AM Medical Record Number: [id number] Patient Account Number: [id number] Date of Birth/Sex: [birth year] (47 y.o. M) Treating RN: Secundino Ginger Primary Care Lionell Matuszak: Gala Romney Other Clinician: Referring Ronald Vinsant: Gala Romney Treating Tamieka Rancourt/Extender: Tito Dine in Treatment: 3 Visit Information History Since Last Visit All ordered tests and consults were completed: No Patient Arrived: Wheel Chair Added or deleted any medications: No Arrival Time: 09:03 Any new allergies or adverse reactions: No Accompanied By: self Had a fall or experienced change in No Transfer Assistance: None activities of daily living that may affect Patient Requires Transmission-Based No risk of falls: Precautions: Signs or symptoms of abuse/neglect since last visito No Patient Has Alerts: Yes Hospitalized since last visit: No Patient Alerts: Patient on Blood Implantable device outside of the clinic excluding No Thinner cellular tissue based products placed in the center DM II since last visit: Eliquis Has Dressing in Place as Prescribed: Yes Pain Present Now: No Electronic Signature(s) Signed: 08/17/2017 11:57:38 AM By: Secundino Ginger Entered By: Secundino Ginger on 08/11/2017 09:05:09 John Bailey ([id number]) -------------------------------------------------------------------------------- Clinic Level of Care Assessment Details Patient Name: John Bailey. Date of Service: 08/11/2017 8:45 AM Medical Record Number: [id number] Patient Account Number: [id number] Date of Birth/Sex: 17-May-1970 (47 y.o. M) Treating RN: Cornell Barman Primary Care Kramer Hanrahan: Gala Romney Other Clinician: Referring Kruti Horacek: Gala Romney Treating Ashtian Villacis/Extender: Tito Dine in Treatment: 3 Clinic Level of Care Assessment Items TOOL 4 Quantity  Score []  - Use when only an EandM is performed on FOLLOW-UP visit 0 ASSESSMENTS - Nursing Assessment / Reassessment X - Reassessment of Co-morbidities (includes updates in patient status) 1 10 X- 1 5 Reassessment of Adherence to Treatment Plan ASSESSMENTS - Wound and Skin Assessment / Reassessment []  - Simple Wound Assessment / Reassessment - one wound 0 X- 2 5 Complex Wound Assessment / Reassessment - multiple wounds []  - 0 Dermatologic / Skin Assessment (not related to wound area) ASSESSMENTS - Focused Assessment []  - Circumferential Edema Measurements - multi extremities 0 []  - 0 Nutritional Assessment / Counseling / Intervention []  - 0 Lower Extremity Assessment (monofilament, tuning fork, pulses) []  - 0 Peripheral Arterial Disease Assessment (using hand held doppler) ASSESSMENTS - Ostomy and/or Continence Assessment and Care []  - Incontinence Assessment and Management 0 []  - 0 Ostomy Care Assessment and Management (repouching, etc.) PROCESS - Coordination of Care []  - Simple Patient / Family Education for ongoing care 0 X- 1 20 Complex (extensive) Patient / Family Education for ongoing care X- 1 10 Staff obtains Programmer, systems, Records, Test Results / Process Orders []  - 0 Staff telephones HHA, Nursing Homes / Clarify orders / etc []  - 0 Routine Transfer to another Facility (non-emergent condition) []  - 0 Routine Hospital Admission (non-emergent condition) []  - 0 New Admissions / Biomedical engineer / Ordering NPWT, Apligraf, etc. []  - 0 Emergency Hospital Admission (emergent condition) X- 1 10 Simple Discharge Coordination John Bailey, John S. ([id number]) []  - 0 Complex (extensive) Discharge Coordination PROCESS - Special Needs []  - Pediatric / Minor Patient Management 0 []  - 0 Isolation Patient Management []  - 0 Hearing / Language / Visual special needs []  - 0 Assessment of Community assistance (transportation, D/C planning, etc.) []  - 0 Additional assistance  / Altered mentation []  - 0 Support Surface(s) Assessment (bed, cushion, seat, etc.) INTERVENTIONS - Wound Cleansing / Measurement []  - Simple Wound Cleansing - one wound  0 X- 2 5 Complex Wound Cleansing - multiple wounds X- 1 5 Wound Imaging (photographs - any number of wounds) []  - 0 Wound Tracing (instead of photographs) []  - 0 Simple Wound Measurement - one wound X- 2 5 Complex Wound Measurement - multiple wounds INTERVENTIONS - Wound Dressings []  - Small Wound Dressing one or multiple wounds 0 []  - 0 Medium Wound Dressing one or multiple wounds X- 2 20 Large Wound Dressing one or multiple wounds []  - 0 Application of Medications - topical []  - 0 Application of Medications - injection INTERVENTIONS - Miscellaneous []  - External ear exam 0 []  - 0 Specimen Collection (cultures, biopsies, blood, body fluids, etc.) []  - 0 Specimen(s) / Culture(s) sent or taken to Lab for analysis []  - 0 Patient Transfer (multiple staff / Civil Service fast streamer / Similar devices) []  - 0 Simple Staple / Suture removal (25 or less) []  - 0 Complex Staple / Suture removal (26 or more) []  - 0 Hypo / Hyperglycemic Management (close monitor of Blood Glucose) []  - 0 Ankle / Brachial Index (ABI) - do not check if billed separately X- 1 5 Vital Signs John Bailey, John S. ([id number]) Has the patient been seen at the hospital within the last three years: Yes Total Score: 135 Level Of Care: New/Established - Level 4 Electronic Signature(s) Signed: 08/11/2017 5:21:30 PM By: Gretta Cool, BSN, RN, CWS, Kim RN, BSN Entered By: Gretta Cool, BSN, RN, CWS, Kim on 08/11/2017 09:40:34 John Bailey ([id number]) -------------------------------------------------------------------------------- Encounter Discharge Information Details Patient Name: John Bailey. Date of Service: 08/11/2017 8:45 AM Medical Record Number: [id number] Patient Account Number: [id number] Date of Birth/Sex: Mar 16, 1971 (47 y.o. M) Treating RN: Montey Hora Primary Care Haleigh Desmith: Gala Romney Other Clinician: Referring Najmo Pardue: Gala Romney Treating Valeri Sula/Extender: Tito Dine in Treatment: 3 Encounter Discharge Information Items Discharge Condition: Stable Ambulatory Status: Wheelchair Discharge Destination: Home Transportation: Private Auto Accompanied By: self Schedule Follow-up Appointment: Yes Clinical Summary of Care: Electronic Signature(s) Signed: 08/11/2017 11:58:00 AM By: Montey Hora Entered By: Montey Hora on 08/11/2017 11:58:00 John Bailey ([id number]) -------------------------------------------------------------------------------- Lower Extremity Assessment Details Patient Name: John Bailey. Date of Service: 08/11/2017 8:45 AM Medical Record Number: [id number] Patient Account Number: [id number] Date of Birth/Sex: 06-20-1970 (47 y.o. M) Treating RN: Secundino Ginger Primary Care Aaryn Parrilla: Gala Romney Other Clinician: Referring Yamili Lichtenwalner: Gala Romney Treating Jla Reynolds/Extender: Ricard Dillon Weeks in Treatment: 3 Electronic Signature(s) Signed: 08/17/2017 11:57:38 AM By: Secundino Ginger Entered By: Secundino Ginger on 08/11/2017 09:19:24 John Bailey ([id number]) -------------------------------------------------------------------------------- Multi Wound Chart Details Patient Name: John Bailey. Date of Service: 08/11/2017 8:45 AM Medical Record Number: [id number] Patient Account Number: [id number] Date of Birth/Sex: [birth year] (47 y.o. M) Treating RN: Cornell Barman Primary Care Yailyn Strack: Gala Romney Other Clinician: Referring Affie Gasner: Gala Romney Treating Jazz Biddy/Extender: Tito Dine in Treatment: 3 Vital Signs Height(in): 71 Pulse(bpm): 84 Weight(lbs): 263 Blood Pressure(mmHg): 129/90 Body Mass Index(BMI): 37 Temperature(F): 97.6 Respiratory Rate 16 (breaths/min): Photos: [1:No Photos] [2:No Photos] [3:No Photos] Wound Location: [1:Sacrum]  [2:Right Gluteus - Proximal] [3:Right, Distal Gluteus] Wounding Event: [1:Pressure Injury] [2:Pressure Injury] [3:Pressure Injury] Primary Etiology: [1:Pressure Ulcer] [2:Pressure Ulcer] [3:Pressure Ulcer] Comorbid History: [1:Arrhythmia, Congestive Heart Arrhythmia, Congestive Heart N/A Failure, Hypertension, Peripheral Venous Disease, Peripheral Venous Disease, Type II Diabetes, End Stage Type II Diabetes, End Stage Renal Disease, History of pressure  wounds, Rheumatoid pressure wounds, Rheumatoid Arthritis, Neuropathy] [2:Failure, Hypertension, Renal Disease, History of Arthritis, Neuropathy] Date Acquired: [1:06/21/2017] [2:06/21/2017] [3:06/21/2017] Weeks of Treatment: [1:3] [2:3] [3:3]  Wound Status: [1:Open] [2:Open] [3:Healed - Epithelialized] Measurements L x W x D [1:3.8x3x5.2] [2:8.1x2.7x2.5] [3:0x0x0] (cm) Area (cm) : [1:8.954] [2:17.177] [3:0] Volume (cm) : [1:46.558] [2:42.942] [3:0] % Reduction in Area: [1:58.20%] [2:53.70%] [3:100.00%] % Reduction in Volume: [1:56.60%] [2:66.90%] [3:100.00%] Classification: [1:Category/Stage IV] [2:Category/Stage IV] [3:Category/Stage IV] Exudate Amount: [1:Large] [2:Large] [3:N/A] Exudate Type: [1:Serous] [2:Serous] [3:N/A] Exudate Color: [1:amber] [2:amber] [3:N/A] Wound Margin: [1:Distinct, outline attached] [2:Distinct, outline attached] [3:N/A] Granulation Amount: [1:Large (67-100%)] [2:Large (67-100%)] [3:N/A] Granulation Quality: [1:Red] [2:Red] [3:N/A] Necrotic Amount: [1:None Present (0%)] [2:None Present (0%)] [3:N/A] Exposed Structures: [1:Fascia: Yes Fat Layer (Subcutaneous Tissue) Exposed: Yes Muscle: Yes Bone: Yes] [2:Fascia: Yes Fat Layer (Subcutaneous Tissue) Exposed: Yes Muscle: Yes] [3:N/A] Epithelialization: [1:None] [2:None] [3:N/A] Periwound Skin Texture: [1:Excoriation: No Induration: No Callus: No] [2:Excoriation: No Induration: No Callus: No] [3:No Abnormalities Noted] Crepitus: No Crepitus: No Rash: No Rash:  No Scarring: No Scarring: No Periwound Skin Moisture: Maceration: No Maceration: No No Abnormalities Noted Dry/Scaly: No Dry/Scaly: No Periwound Skin Color: Atrophie Blanche: No Atrophie Blanche: No No Abnormalities Noted Cyanosis: No Cyanosis: No Ecchymosis: No Ecchymosis: No Erythema: No Erythema: No Hemosiderin Staining: No Hemosiderin Staining: No Mottled: No Mottled: No Pallor: No Pallor: No Rubor: No Rubor: No Temperature: No Abnormality No Abnormality N/A Tenderness on Palpation: Yes Yes No Wound Preparation: Ulcer Cleansing: Ulcer Cleansing: N/A Rinsed/Irrigated with Saline Rinsed/Irrigated with Saline Topical Anesthetic Applied: Topical Anesthetic Applied: Other: lidocaine 4% Other: lidocaine 4% Treatment Notes Electronic Signature(s) Signed: 08/12/2017 8:24:56 AM By: Linton Ham MD Entered By: Linton Ham on 08/11/2017 10:13:25 John Bailey, John Bailey ([id number]) -------------------------------------------------------------------------------- [place] Details Patient Name: John Bailey, John Bailey. Date of Service: 08/11/2017 8:45 AM Medical Record Number: [id number] Patient Account Number: [id number] Date of Birth/Sex: Mar 05, 1971 (47 y.o. M) Treating RN: Cornell Barman Primary Care Bird Tailor: Gala Romney Other Clinician: Referring Starletta Houchin: Gala Romney Treating Tava Peery/Extender: Tito Dine in Treatment: 3 Active Inactive Electronic Signature(s) Signed: 08/27/2017 8:33:18 AM By: Gretta Cool, BSN, RN, CWS, Kim RN, BSN Previous Signature: 08/11/2017 5:21:30 PM Version By: Gretta Cool, BSN, RN, CWS, Kim RN, BSN Entered By: Gretta Cool, BSN, RN, CWS, Kim on 08/27/2017 08:33:18 John Bailey ([id number]) -------------------------------------------------------------------------------- Pain Assessment Details Patient Name: John Bailey, John Bailey. Date of Service: 08/11/2017 8:45 AM Medical Record Number: [id number] Patient Account Number:  [id number] Date of Birth/Sex: September 29, 1970 (47 y.o. M) Treating RN: Secundino Ginger Primary Care Jadee Golebiewski: Gala Romney Other Clinician: Referring Iker Nuttall: Gala Romney Treating Numa Heatwole/Extender: Tito Dine in Treatment: 3 Active Problems Location of Pain Severity and Description of Pain Patient Has Paino No Site Locations Pain Management and Medication Current Pain Management: Electronic Signature(s) Signed: 08/17/2017 11:57:38 AM By: Secundino Ginger Entered By: Secundino Ginger on 08/11/2017 09:05:25 John Bailey ([id number]) -------------------------------------------------------------------------------- Patient/Caregiver Education Details Patient Name: John Bailey. Date of Service: 08/11/2017 8:45 AM Medical Record Number: [id number] Patient Account Number: [id number] Date of Birth/Gender: June 24, 1970 (47 y.o. M) Treating RN: Montey Hora Primary Care Physician: Gala Romney Other Clinician: Referring Physician: Gala Romney Treating Physician/Extender: Tito Dine in Treatment: 3 Education Assessment Education Provided To: Patient Education Topics Provided Offloading: Methods: Explain/Verbal Responses: State content correctly Electronic Signature(s) Signed: 08/11/2017 1:57:55 PM By: Montey Hora Entered By: Montey Hora on 08/11/2017 11:58:20 John Bailey ([id number]) -------------------------------------------------------------------------------- Wound Assessment Details Patient Name: John Bailey. Date of Service: 08/11/2017 8:45 AM Medical Record Number: [id number] Patient Account Number: [id number] Date of Birth/Sex: 07/26/1970 (47 y.o. M) Treating RN: Secundino Ginger Primary Care Aleeya Veitch: Gala Romney Other Clinician: Referring  Emilyann Banka: Gala Romney Treating Elleanna Melling/Extender: Tito Dine in Treatment: 3 Wound Status Wound Number: 1 Primary Pressure Ulcer Etiology: Wound Location: Sacrum Wound Open Wounding  Event: Pressure Injury Status: Date Acquired: 06/21/2017 Comorbid Arrhythmia, Congestive Heart Failure, Weeks Of Treatment: 3 History: Hypertension, Peripheral Venous Disease, Type Clustered Wound: No II Diabetes, End Stage Renal Disease, History of pressure wounds, Rheumatoid Arthritis, Neuropathy Wound Measurements Length: (cm) 3.8 Width: (cm) 3 Depth: (cm) 5.2 Area: (cm) 8.954 Volume: (cm) 46.558 % Reduction in Area: 58.2% % Reduction in Volume: 56.6% Epithelialization: None Tunneling: No Undermining: No Wound Description Classification: Category/Stage IV Wound Margin: Distinct, outline attached Exudate Amount: Large Exudate Type: Serous Exudate Color: amber Foul Odor After Cleansing: No Wound Bed Granulation Amount: Large (67-100%) Exposed Structure Granulation Quality: Red Fascia Exposed: Yes Necrotic Amount: None Present (0%) Fat Layer (Subcutaneous Tissue) Exposed: Yes Muscle Exposed: Yes Necrosis of Muscle: No Bone Exposed: Yes Periwound Skin Texture Texture Color No Abnormalities Noted: No No Abnormalities Noted: No Callus: No Atrophie Blanche: No Crepitus: No Cyanosis: No Excoriation: No Ecchymosis: No Induration: No Erythema: No Rash: No Hemosiderin Staining: No Scarring: No Mottled: No Pallor: No Moisture Rubor: No No Abnormalities Noted: No Dry / Scaly: No Temperature / Pain John Bailey, John S. ([id number]) Maceration: No Temperature: No Abnormality Tenderness on Palpation: Yes Wound Preparation Ulcer Cleansing: Rinsed/Irrigated with Saline Topical Anesthetic Applied: Other: lidocaine 4%, Electronic Signature(s) Signed: 08/17/2017 11:57:38 AM By: Secundino Ginger Entered By: Secundino Ginger on 08/11/2017 09:19:12 John Bailey ([id number]) -------------------------------------------------------------------------------- Wound Assessment Details Patient Name: John Bailey. Date of Service: 08/11/2017 8:45 AM Medical Record Number:  [id number] Patient Account Number: [id number] Date of Birth/Sex: 01-Oct-1970 (48 y.o. M) Treating RN: Secundino Ginger Primary Care Elyjah Hazan: Gala Romney Other Clinician: Referring Hridhaan Yohn: Gala Romney Treating Daniell Paradise/Extender: Tito Dine in Treatment: 3 Wound Status Wound Number: 2 Primary Pressure Ulcer Etiology: Wound Location: Right Gluteus - Proximal Wound Open Wounding Event: Pressure Injury Status: Date Acquired: 06/21/2017 Comorbid Arrhythmia, Congestive Heart Failure, Weeks Of Treatment: 3 History: Hypertension, Peripheral Venous Disease, Type Clustered Wound: No II Diabetes, End Stage Renal Disease, History of pressure wounds, Rheumatoid Arthritis, Neuropathy Wound Measurements Length: (cm) 8.1 Width: (cm) 2.7 Depth: (cm) 2.5 Area: (cm) 17.177 Volume: (cm) 42.942 % Reduction in Area: 53.7% % Reduction in Volume: 66.9% Epithelialization: None Tunneling: No Undermining: No Wound Description Classification: Category/Stage IV Wound Margin: Distinct, outline attached Exudate Amount: Large Exudate Type: Serous Exudate Color: amber Foul Odor After Cleansing: No Slough/Fibrino Yes Wound Bed Granulation Amount: Large (67-100%) Exposed Structure Granulation Quality: Red Fascia Exposed: Yes Necrotic Amount: None Present (0%) Fat Layer (Subcutaneous Tissue) Exposed: Yes Muscle Exposed: Yes Necrosis of Muscle: No Periwound Skin Texture Texture Color No Abnormalities Noted: No No Abnormalities Noted: No Callus: No Atrophie Blanche: No Crepitus: No Cyanosis: No Excoriation: No Ecchymosis: No Induration: No Erythema: No Rash: No Hemosiderin Staining: No Scarring: No Mottled: No Pallor: No Moisture Rubor: No No Abnormalities Noted: No Dry / Scaly: No Temperature / Pain Maceration: No Temperature: No Abnormality John Bailey, John S. ([id number]) Tenderness on Palpation: Yes Wound Preparation Ulcer Cleansing: Rinsed/Irrigated with  Saline Topical Anesthetic Applied: Other: lidocaine 4%, Electronic Signature(s) Signed: 08/17/2017 11:57:38 AM By: Secundino Ginger Entered By: Secundino Ginger on 08/11/2017 09:18:48 John Bailey ([id number]) -------------------------------------------------------------------------------- Wound Assessment Details Patient Name: John Bailey. Date of Service: 08/11/2017 8:45 AM Medical Record Number: [id number] Patient Account Number: [id number] Date of Birth/Sex: Jun 26, 1970 (47 y.o. M) Treating RN: Secundino Ginger Primary Care Fintan Grater:  Gala Romney Other Clinician: Referring Chantella Creech: Gala Romney Treating Lynard Postlewait/Extender: Tito Dine in Treatment: 3 Wound Status Wound Number: 3 Primary Etiology: Pressure Ulcer Wound Location: Right, Distal Gluteus Wound Status: Healed - Epithelialized Wounding Event: Pressure Injury Date Acquired: 06/21/2017 Weeks Of Treatment: 3 Clustered Wound: No Wound Measurements Length: (cm) 0 Width: (cm) 0 Depth: (cm) 0 Area: (cm) 0 Volume: (cm) 0 % Reduction in Area: 100% % Reduction in Volume: 100% Wound Description Classification: Category/Stage IV Periwound Skin Texture Texture Color No Abnormalities Noted: No No Abnormalities Noted: No Moisture No Abnormalities Noted: No Electronic Signature(s) Signed: 08/17/2017 11:57:38 AM By: Secundino Ginger Entered By: Secundino Ginger on 08/11/2017 09:14:33 John Bailey, John Bailey ([id number]) -------------------------------------------------------------------------------- Vitals Details Patient Name: John Bailey. Date of Service: 08/11/2017 8:45 AM Medical Record Number: [id number] Patient Account Number: [id number] Date of Birth/Sex: 03-13-1971 (47 y.o. M) Treating RN: Secundino Ginger Primary Care Mishel Sans: Gala Romney Other Clinician: Referring Larnell Granlund: Gala Romney Treating Ailana Cuadrado/Extender: Tito Dine in Treatment: 3 Vital Signs Time Taken: 09:03 Temperature (F): 97.6 Height  (in): 71 Pulse (bpm): 84 Weight (lbs): 263 Respiratory Rate (breaths/min): 16 Body Mass Index (BMI): 36.7 Blood Pressure (mmHg): 129/90 Reference Range: 80 - 120 mg / dl Electronic Signature(s) Signed: 08/17/2017 11:57:38 AM By: Secundino Ginger Entered BySecundino Ginger on 08/11/2017 09:06:14

## 2017-08-20 ENCOUNTER — Other Ambulatory Visit: Payer: Self-pay

## 2017-08-20 DIAGNOSIS — Z48812 Encounter for surgical aftercare following surgery on the circulatory system: Secondary | ICD-10-CM

## 2017-08-20 DIAGNOSIS — N184 Chronic kidney disease, stage 4 (severe): Secondary | ICD-10-CM

## 2017-08-23 ENCOUNTER — Ambulatory Visit (HOSPITAL_COMMUNITY): Payer: BLUE CROSS/BLUE SHIELD | Admitting: Nurse Practitioner

## 2017-08-27 ENCOUNTER — Encounter (HOSPITAL_BASED_OUTPATIENT_CLINIC_OR_DEPARTMENT_OTHER): Payer: BLUE CROSS/BLUE SHIELD | Attending: Internal Medicine

## 2017-08-27 DIAGNOSIS — L89154 Pressure ulcer of sacral region, stage 4: Secondary | ICD-10-CM | POA: Insufficient documentation

## 2017-08-27 DIAGNOSIS — I2510 Atherosclerotic heart disease of native coronary artery without angina pectoris: Secondary | ICD-10-CM | POA: Diagnosis not present

## 2017-08-27 DIAGNOSIS — N189 Chronic kidney disease, unspecified: Secondary | ICD-10-CM | POA: Diagnosis not present

## 2017-08-27 DIAGNOSIS — I130 Hypertensive heart and chronic kidney disease with heart failure and stage 1 through stage 4 chronic kidney disease, or unspecified chronic kidney disease: Secondary | ICD-10-CM | POA: Diagnosis not present

## 2017-08-27 DIAGNOSIS — E1122 Type 2 diabetes mellitus with diabetic chronic kidney disease: Secondary | ICD-10-CM | POA: Insufficient documentation

## 2017-08-27 DIAGNOSIS — E1140 Type 2 diabetes mellitus with diabetic neuropathy, unspecified: Secondary | ICD-10-CM | POA: Diagnosis not present

## 2017-08-27 DIAGNOSIS — Z992 Dependence on renal dialysis: Secondary | ICD-10-CM | POA: Diagnosis not present

## 2017-08-27 DIAGNOSIS — I509 Heart failure, unspecified: Secondary | ICD-10-CM | POA: Insufficient documentation

## 2017-08-27 DIAGNOSIS — L89314 Pressure ulcer of right buttock, stage 4: Secondary | ICD-10-CM | POA: Insufficient documentation

## 2017-09-08 ENCOUNTER — Inpatient Hospital Stay (HOSPITAL_COMMUNITY): Admission: RE | Admit: 2017-09-08 | Payer: BLUE CROSS/BLUE SHIELD | Source: Ambulatory Visit

## 2017-09-10 DIAGNOSIS — L89154 Pressure ulcer of sacral region, stage 4: Secondary | ICD-10-CM | POA: Diagnosis not present

## 2017-10-01 ENCOUNTER — Encounter (HOSPITAL_BASED_OUTPATIENT_CLINIC_OR_DEPARTMENT_OTHER): Payer: BLUE CROSS/BLUE SHIELD | Attending: Internal Medicine

## 2017-10-01 DIAGNOSIS — I2510 Atherosclerotic heart disease of native coronary artery without angina pectoris: Secondary | ICD-10-CM | POA: Insufficient documentation

## 2017-10-01 DIAGNOSIS — I509 Heart failure, unspecified: Secondary | ICD-10-CM | POA: Insufficient documentation

## 2017-10-01 DIAGNOSIS — I110 Hypertensive heart disease with heart failure: Secondary | ICD-10-CM | POA: Diagnosis not present

## 2017-10-01 DIAGNOSIS — L89314 Pressure ulcer of right buttock, stage 4: Secondary | ICD-10-CM | POA: Insufficient documentation

## 2017-10-01 DIAGNOSIS — L89154 Pressure ulcer of sacral region, stage 4: Secondary | ICD-10-CM | POA: Diagnosis not present

## 2017-10-01 DIAGNOSIS — E1140 Type 2 diabetes mellitus with diabetic neuropathy, unspecified: Secondary | ICD-10-CM | POA: Diagnosis not present

## 2017-10-20 ENCOUNTER — Encounter (HOSPITAL_BASED_OUTPATIENT_CLINIC_OR_DEPARTMENT_OTHER): Payer: BLUE CROSS/BLUE SHIELD | Attending: Physician Assistant

## 2017-10-20 DIAGNOSIS — L89154 Pressure ulcer of sacral region, stage 4: Secondary | ICD-10-CM | POA: Insufficient documentation

## 2017-10-20 DIAGNOSIS — E1140 Type 2 diabetes mellitus with diabetic neuropathy, unspecified: Secondary | ICD-10-CM | POA: Diagnosis not present

## 2017-10-20 DIAGNOSIS — I509 Heart failure, unspecified: Secondary | ICD-10-CM | POA: Insufficient documentation

## 2017-10-20 DIAGNOSIS — I110 Hypertensive heart disease with heart failure: Secondary | ICD-10-CM | POA: Diagnosis not present

## 2017-10-20 DIAGNOSIS — Z794 Long term (current) use of insulin: Secondary | ICD-10-CM | POA: Insufficient documentation

## 2017-10-20 DIAGNOSIS — I2510 Atherosclerotic heart disease of native coronary artery without angina pectoris: Secondary | ICD-10-CM | POA: Diagnosis not present

## 2017-10-20 DIAGNOSIS — L89314 Pressure ulcer of right buttock, stage 4: Secondary | ICD-10-CM | POA: Diagnosis not present

## 2017-10-25 ENCOUNTER — Encounter: Payer: Self-pay | Admitting: *Deleted

## 2017-10-25 ENCOUNTER — Telehealth: Payer: Self-pay | Admitting: *Deleted

## 2017-10-25 ENCOUNTER — Ambulatory Visit (INDEPENDENT_AMBULATORY_CARE_PROVIDER_SITE_OTHER): Payer: BLUE CROSS/BLUE SHIELD | Admitting: Physician Assistant

## 2017-10-25 ENCOUNTER — Other Ambulatory Visit: Payer: Self-pay

## 2017-10-25 ENCOUNTER — Ambulatory Visit (HOSPITAL_COMMUNITY)
Admission: RE | Admit: 2017-10-25 | Discharge: 2017-10-25 | Disposition: A | Discharge status: Home / Self Care | Payer: BLUE CROSS/BLUE SHIELD | Source: Ambulatory Visit | Attending: Surgery | Admitting: Surgery

## 2017-10-25 ENCOUNTER — Other Ambulatory Visit: Payer: Self-pay | Admitting: *Deleted

## 2017-10-25 VITALS — BP 141/90 | HR 83 | Resp 18 | Ht 71.0 in | Wt 250.0 lb

## 2017-10-25 DIAGNOSIS — N184 Chronic kidney disease, stage 4 (severe): Secondary | ICD-10-CM | POA: Diagnosis not present

## 2017-10-25 DIAGNOSIS — Z48812 Encounter for surgical aftercare following surgery on the circulatory system: Secondary | ICD-10-CM | POA: Diagnosis present

## 2017-10-25 DIAGNOSIS — Z992 Dependence on renal dialysis: Secondary | ICD-10-CM | POA: Diagnosis not present

## 2017-10-25 DIAGNOSIS — N186 End stage renal disease: Secondary | ICD-10-CM | POA: Diagnosis not present

## 2017-10-25 NOTE — Telephone Encounter (Signed)
-----   Message from Fidel Levy, RN sent at 10/25/2017  3:58 PM EDT ----- Regarding: RE: Eliquis hold for procedure Hi Becky! Sorry to be a bother about these clearances. Staff messages are not saved the patient's chart for others to reference and can unfortunately be easily deleted. I see that this is an additional staff message clearance request in addition to the one we spoke about this morning.   Can this be formatted as followed and routed to our pre-op pool (P CV DIV PREOP)? In the visit info "clearance 10/29/17"   [facility] Pre-operative Risk Assessment   Request for surgical clearance:  What type of surgery is being performed?   When is this surgery scheduled?  What type of clearance is required (medical clearance vs. Pharmacy clearance to hold med vs. Both)?   Are there any medications that need to be held prior to surgery and how long?    Practice name and name of physician performing surgery?    What is your office phone number?   What is your office fax number?  Anesthesia type (None, local, MAC, general) ?     ----- Message ----- From: Willy Eddy, RN Sent: 10/25/2017   3:43 PM EDT To: Cv Div Nl Triage Subject: Eliquis hold for procedure                     Medication hold for procedure :Eliquis hold for 2 days pre-procedure.  Fistulogram with sedation/ Dr. Oneida Alar  scheduled for 10/29/17.   [facility] [phone] fax [phone] Gomez Cleverly Rn

## 2017-10-25 NOTE — H&P (View-Only) (Signed)
POST OPERATIVE OFFICE NOTE    CC:  F/u for surgery  HPI:  This is a 47 y.o. male who is s/p left radial cephalic AVF on 09/05/27 by Dr. Donnetta Hutching.  He is here today for follow up.  He is on dialysis on T/T/S at the center on [address].  He dialyzes via a tunneled dialysis catheter that was placed on 05/04/17 by Dr. Bridgett Larsson.  He states that he does have some numbness in his fingertips on the left hand and he has a difficult time with grip.  The day after surgery, he did have tingling in his fingers that was present prior to the creation of the fistula.  He states that he does have rheumatoid arthritis.  He states that he is unsure when he started having trouble with his hand.    He is on Eliquis for afib.  He is on a beta blocker.  He underwent debridement of large area involving subcutaneous tissue, fascia and muscle of the right gluteal area on 06/14/17 and continues with wound vac for healing.   He is on insulin for diabetes.   No Known Allergies  Current Outpatient Medications  Medication Sig Dispense Refill  . amiodarone (PACERONE) 200 MG tablet Take 1 tablet (200 mg total) by mouth daily. 30 tablet 0  . apixaban (ELIQUIS) 5 MG TABS tablet Take 1 tablet (5 mg total) by mouth 2 (two) times daily. 60 tablet 0  . calcitRIOL (ROCALTROL) 0.5 MCG capsule Take 1 capsule (0.5 mcg total) by mouth every Tuesday, Thursday, and Saturday at 6 PM. 90 capsule 0  . carvedilol (COREG) 6.25 MG tablet Take 1 tablet (6.25 mg total) by mouth 2 (two) times daily with a meal. 60 tablet 0  . collagenase (SANTYL) ointment Apply topically daily.    . diclofenac sodium (VOLTAREN) 1 % GEL Apply 4 g topically 2 (two) times daily as needed (apply over affected area).    . feeding supplement, GLUCERNA SHAKE, (GLUCERNA SHAKE) LIQD Take 237 mLs by mouth 3 (three) times daily between meals.  0  . insulin glargine (LANTUS) 100 UNIT/ML injection Inject 0.15 mLs (15 Units total) into the skin at bedtime.    [name]  (FOSRENOL) 500 MG chewable tablet Chew 0.5 tablets (250 mg total) by mouth 3 (three) times daily with meals.    [name] NOVOLOG FLEXPEN 100 UNIT/ML FlexPen Inject as directed as directed.  0  . predniSONE (DELTASONE) 10 MG tablet Take 1 tablet (10 mg total) by mouth daily with breakfast. Last dose 4/25     No current facility-administered medications for this visit.      ROS:   Cardiac  Comments:  Chest pain or chest pressure:    Shortness of breath upon exertion:    Short of breath when lying flat:    Irregular heart rhythm: x       Vascular    Pain in calf, thigh, or hip brought on by ambulation:    Pain in feet at night that wakes you up from your sleep:     Blood clot in your veins:    Leg swelling:         Pulmonary    Oxygen at home:    Productive cough:     Wheezing:         Neurologic    Sudden weakness in arms or legs:     Sudden numbness in arms or legs:     Sudden onset of difficulty speaking or slurred  speech:    Temporary loss of vision in one eye:     Problems with dizziness:         Gastrointestinal    Blood in stool:     Vomited blood:         Genitourinary    Burning when urinating:     Blood in urine:        Psychiatric    Major depression:         Hematologic    Bleeding problems:    Problems with blood clotting too easily:        Skin    Rashes or ulcers: x Wound vac for gluteal wound      Constitutional    Fever or chills:      Physical Exam:  Vitals:   10/25/17 1452 10/25/17 1453  BP: (!) 150/98 (!) 141/90  Pulse: 83   Resp: 18   SpO2: 98%    Vitals:   10/25/17 1452  Weight: 250 lb (113.4 kg)  Height: 5\' 11"  (1.803 m)   Body mass index is 34.87 kg/m.  General:  No distress  Cardiac:  Regular rhythm; -carotid bruits Lungs:  CTAB HENT: normocephalic Neuro:  In tact Incision:  Well healed Extremities:  +palpable left radial pulse; +palmar arch doppler signal present as well as digit doppler signal present.  Fingers on both  hands are similar in temperature.  Fingers on left hand slightly darker than the right.  Decreased sensation on the fingertips left hand and unable to completely grip with left hand.  Excellent thrill in the distal fistula and easily palpable but becomes decreased about a 1/3 of the way up the arm.  Psych: normal affect  Dialysis duplex 10/25/17: Diameter:  0.23cm-0.66cm Depth:  0.19cm-1.47cm (proximal UA)  Assessment/Plan:  This is a 47 y.o. male who is s/p: Creation of left radial cephalic AVF on 07/01/38 by Dr. Donnetta Hutching  -pt with patent fistula, however, it is not maturing adequately.  He will need a fistulogram to evaluate with possible intervention.   He will need to stop his Eliquis a couple of days prior to procedure.   We will arrange this on a non dialysis day (M/W/F). -he does have some decreased motor and sensation in the left hand.  He had tingling in his left hand prior to surgery.  He does have an easily palpable left radial pulse and a +palmar arch doppler signal as well as +digit doppler signal.  He is unable to completely grip with the left hand.  I had Dr. Trula Slade evaluate pt and he did not feel the pt's sx were related to steal sx given the palpable radial pulse and + doppler signals present.  This was discussed with the pt. Pt states he does have RA and feels some of this could be contributed to that.  The temperature of the fingers on both hands are equal.     [name], PA-C [facility] [phone]  Clinic MD:  Pt seen with Dr. Trula Slade

## 2017-10-25 NOTE — Progress Notes (Signed)
POST OPERATIVE OFFICE NOTE    CC:  F/u for surgery  HPI:  This is a 47 y.o. male who is s/p left radial cephalic AVF on 5/78/46 by Dr. Donnetta Hutching.  He is here today for follow up.  He is on dialysis on T/T/S at the center on [address].  He dialyzes via a tunneled dialysis catheter that was placed on 05/04/17 by Dr. Bridgett Larsson.  He states that he does have some numbness in his fingertips on the left hand and he has a difficult time with grip.  The day after surgery, he did have tingling in his fingers that was present prior to the creation of the fistula.  He states that he does have rheumatoid arthritis.  He states that he is unsure when he started having trouble with his hand.    He is on Eliquis for afib.  He is on a beta blocker.  He underwent debridement of large area involving subcutaneous tissue, fascia and muscle of the right gluteal area on 06/14/17 and continues with wound vac for healing.   He is on insulin for diabetes.   No Known Allergies  Current Outpatient Medications  Medication Sig Dispense Refill  . amiodarone (PACERONE) 200 MG tablet Take 1 tablet (200 mg total) by mouth daily. 30 tablet 0  . apixaban (ELIQUIS) 5 MG TABS tablet Take 1 tablet (5 mg total) by mouth 2 (two) times daily. 60 tablet 0  . calcitRIOL (ROCALTROL) 0.5 MCG capsule Take 1 capsule (0.5 mcg total) by mouth every Tuesday, Thursday, and Saturday at 6 PM. 90 capsule 0  . carvedilol (COREG) 6.25 MG tablet Take 1 tablet (6.25 mg total) by mouth 2 (two) times daily with a meal. 60 tablet 0  . collagenase (SANTYL) ointment Apply topically daily.    . diclofenac sodium (VOLTAREN) 1 % GEL Apply 4 g topically 2 (two) times daily as needed (apply over affected area).    . feeding supplement, GLUCERNA SHAKE, (GLUCERNA SHAKE) LIQD Take 237 mLs by mouth 3 (three) times daily between meals.  0  . insulin glargine (LANTUS) 100 UNIT/ML injection Inject 0.15 mLs (15 Units total) into the skin at bedtime.    [name]  (FOSRENOL) 500 MG chewable tablet Chew 0.5 tablets (250 mg total) by mouth 3 (three) times daily with meals.    [name] NOVOLOG FLEXPEN 100 UNIT/ML FlexPen Inject as directed as directed.  0  . predniSONE (DELTASONE) 10 MG tablet Take 1 tablet (10 mg total) by mouth daily with breakfast. Last dose 4/25     No current facility-administered medications for this visit.      ROS:   Cardiac  Comments:  Chest pain or chest pressure:    Shortness of breath upon exertion:    Short of breath when lying flat:    Irregular heart rhythm: x       Vascular    Pain in calf, thigh, or hip brought on by ambulation:    Pain in feet at night that wakes you up from your sleep:     Blood clot in your veins:    Leg swelling:         Pulmonary    Oxygen at home:    Productive cough:     Wheezing:         Neurologic    Sudden weakness in arms or legs:     Sudden numbness in arms or legs:     Sudden onset of difficulty speaking or slurred  speech:    Temporary loss of vision in one eye:     Problems with dizziness:         Gastrointestinal    Blood in stool:     Vomited blood:         Genitourinary    Burning when urinating:     Blood in urine:        Psychiatric    Major depression:         Hematologic    Bleeding problems:    Problems with blood clotting too easily:        Skin    Rashes or ulcers: x Wound vac for gluteal wound      Constitutional    Fever or chills:      Physical Exam:  Vitals:   10/25/17 1452 10/25/17 1453  BP: (!) 150/98 (!) 141/90  Pulse: 83   Resp: 18   SpO2: 98%    Vitals:   10/25/17 1452  Weight: 250 lb (113.4 kg)  Height: 5\' 11"  (1.803 m)   Body mass index is 34.87 kg/m.  General:  No distress  Cardiac:  Regular rhythm; -carotid bruits Lungs:  CTAB HENT: normocephalic Neuro:  In tact Incision:  Well healed Extremities:  +palpable left radial pulse; +palmar arch doppler signal present as well as digit doppler signal present.  Fingers on both  hands are similar in temperature.  Fingers on left hand slightly darker than the right.  Decreased sensation on the fingertips left hand and unable to completely grip with left hand.  Excellent thrill in the distal fistula and easily palpable but becomes decreased about a 1/3 of the way up the arm.  Psych: normal affect  Dialysis duplex 10/25/17: Diameter:  0.23cm-0.66cm Depth:  0.19cm-1.47cm (proximal UA)  Assessment/Plan:  This is a 47 y.o. male who is s/p: Creation of left radial cephalic AVF on 07/16/75 by Dr. Donnetta Hutching  -pt with patent fistula, however, it is not maturing adequately.  He will need a fistulogram to evaluate with possible intervention.   He will need to stop his Eliquis a couple of days prior to procedure.   We will arrange this on a non dialysis day (M/W/F). -he does have some decreased motor and sensation in the left hand.  He had tingling in his left hand prior to surgery.  He does have an easily palpable left radial pulse and a +palmar arch doppler signal as well as +digit doppler signal.  He is unable to completely grip with the left hand.  I had Dr. Trula Slade evaluate pt and he did not feel the pt's sx were related to steal sx given the palpable radial pulse and + doppler signals present.  This was discussed with the pt. Pt states he does have RA and feels some of this could be contributed to that.  The temperature of the fingers on both hands are equal.     [name], PA-C [facility] [phone]  Clinic MD:  Pt seen with Dr. Trula Slade

## 2017-10-26 ENCOUNTER — Telehealth: Payer: Self-pay | Admitting: Internal Medicine

## 2017-10-26 NOTE — Telephone Encounter (Signed)
Pt takes Eliquis for afib with CHADS2VASc score of 3 (CHF, HTN, DM). SCr has ranged 5-7 this year and pt has ESRD. He is taking Eliquis 5mg  BID. Data regarding efficacy of DOACs in dialysis patients is limited. Pharmacokinetics data regarding clearance of Eliquis is also very limited in this patient population and is limited to a single dose of Eliquis 5mg  in 8 dialysis patients. Looks as though he used to take warfarin in 2017 however was noncompliant and rarely therapeutic. Took Xarelto in 2018, most recently switched to Eliquis in 2019. Per Dr Lysbeth Penner most recent note 06/2017, he is aware pt has been on [place] therapy. Procedure is in 3 days, recommend holding Eliquis for 3 days however complete clearance of Eliquis is unlikely in that time frame. Will also route to Dr Debara Pickett for any additional input.

## 2017-10-26 NOTE — Telephone Encounter (Signed)
Per staff message received  ----- Message -----  From: Willy Eddy, RN  Sent: 10/25/2017  3:43 PM EDT  To: Cv Div Nl Triage  Subject: Eliquis hold for procedure            Medication hold for procedure  :Eliquis hold for 2 days pre-procedure.  Fistulogram with sedation/ Dr. Oneida Alar  scheduled for 10/29/17.   [facility]  [phone] fax [phone]  Gomez Cleverly Rn

## 2017-10-26 NOTE — Telephone Encounter (Signed)
I spoke with the patient. He is doing well from a cardiac standpoint. Will await pharmacy recommendation on Eliquis then fax clearance to Dr Oneida Alar.  Kerin Ransom PA-C 10/26/2017 4:09 PM

## 2017-10-26 NOTE — Telephone Encounter (Signed)
   [facility] Pre-operative Risk Assessment    Request for surgical clearance:  1. What type of surgery is being performed? fistulogram    2. When is this surgery scheduled? 10/29/17   3. What type of clearance is required (medical clearance vs. Pharmacy clearance to hold med vs. Both)? Both   4. Are there any medications that need to be held prior to surgery and how long? eliquis 2 days   5. Practice name and name of physician performing surgery? Dr. Oneida Alar @ [facility]   6. What is your office phone number [phone]   7.   What is your office fax number [phone]  8.   Anesthesia type (None, local, MAC, general) ? Sedation unspecified    John Bailey 10/26/2017, 7:28 AM  _________________________________________________________________   (provider comments below)

## 2017-10-27 ENCOUNTER — Encounter: Payer: Self-pay | Admitting: Pharmacist

## 2017-10-27 ENCOUNTER — Telehealth: Payer: Self-pay | Admitting: *Deleted

## 2017-10-27 NOTE — Telephone Encounter (Signed)
This encounter was created in error - please disregard.

## 2017-10-27 NOTE — Telephone Encounter (Signed)
-----   Message from Willy Eddy, RN sent at 10/27/2017  8:32 AM EDT ----- Regarding: [place] Requesting your help with this patient. In preparation for fistulogram patient was instructed to hold Eliquis for 2 days per our office guidelines. I was contacting your office to get clearance and if patient required any bridging. He would hold medication starting today. Do we need to reschedule this procedure to allow time for decision on how to handle his anticoag? Or is there a chance we can get resolution today? He needs this procedure for his HD access. Thank you for your time. Becky

## 2017-10-27 NOTE — Telephone Encounter (Signed)
-----   Message from Willy Eddy, RN sent at 10/27/2017  8:32 AM EDT ----- Regarding: [place] Requesting your help with this patient. In preparation for fistulogram patient was instructed to hold Eliquis for 2 days per our office guidelines. I was contacting your office to get clearance and if patient required any bridging. He would hold medication starting today. Do we need to reschedule this procedure to allow time for decision on how to handle his anticoag? Or is there a chance we can get resolution today? He needs this procedure for his HD access. Thank you for your time. Becky

## 2017-10-27 NOTE — Telephone Encounter (Signed)
Ok to hold Eliquis 3 days prior to procedure and restart 24 hours afterwards.  Dr. Lemmie Evens

## 2017-10-27 NOTE — Telephone Encounter (Signed)
   Primary Cardiologist: Pixie Casino, MD  Chart reviewed and patient interviewed as part of pre-operative protocol coverage. Given past medical history and based on ACC/AHA guidelines, John Bailey would be at acceptable risk for the planned procedure without further cardiovascular testing.   OK to hold Eliquis 3 days pre op, resume 24 hours post op.   I will route this recommendation to the requesting party via Epic fax function and remove from pre-op pool.  Please call with questions.  Kerin Ransom, PA-C 10/27/2017, 4:21 PM

## 2017-10-29 ENCOUNTER — Encounter (HOSPITAL_COMMUNITY)
Admission: RE | Disposition: A | Discharge status: Home / Self Care | Payer: Self-pay | Source: Ambulatory Visit | Attending: Vascular Surgery

## 2017-10-29 ENCOUNTER — Encounter (HOSPITAL_COMMUNITY): Payer: Self-pay | Admitting: *Deleted

## 2017-10-29 ENCOUNTER — Ambulatory Visit (HOSPITAL_COMMUNITY)
Admission: RE | Admit: 2017-10-29 | Discharge: 2017-10-29 | Disposition: A | Discharge status: Home / Self Care | Payer: BLUE CROSS/BLUE SHIELD | Source: Ambulatory Visit | Attending: Vascular Surgery | Admitting: Vascular Surgery

## 2017-10-29 ENCOUNTER — Other Ambulatory Visit: Payer: Self-pay

## 2017-10-29 DIAGNOSIS — Y832 Surgical operation with anastomosis, bypass or graft as the cause of abnormal reaction of the patient, or of later complication, without mention of misadventure at the time of the procedure: Secondary | ICD-10-CM | POA: Diagnosis not present

## 2017-10-29 DIAGNOSIS — Z7901 Long term (current) use of anticoagulants: Secondary | ICD-10-CM | POA: Diagnosis not present

## 2017-10-29 DIAGNOSIS — Z794 Long term (current) use of insulin: Secondary | ICD-10-CM | POA: Insufficient documentation

## 2017-10-29 DIAGNOSIS — E119 Type 2 diabetes mellitus without complications: Secondary | ICD-10-CM | POA: Insufficient documentation

## 2017-10-29 DIAGNOSIS — M069 Rheumatoid arthritis, unspecified: Secondary | ICD-10-CM | POA: Diagnosis not present

## 2017-10-29 DIAGNOSIS — T82898A Other specified complication of vascular prosthetic devices, implants and grafts, initial encounter: Secondary | ICD-10-CM

## 2017-10-29 DIAGNOSIS — T82858A Stenosis of vascular prosthetic devices, implants and grafts, initial encounter: Secondary | ICD-10-CM | POA: Diagnosis not present

## 2017-10-29 DIAGNOSIS — I4891 Unspecified atrial fibrillation: Secondary | ICD-10-CM | POA: Diagnosis not present

## 2017-10-29 HISTORY — PX: PERIPHERAL VASCULAR BALLOON ANGIOPLASTY: CATH118281

## 2017-10-29 HISTORY — PX: A/V FISTULAGRAM: CATH118298

## 2017-10-29 LAB — POCT I-STAT, CHEM 8
BUN: 37 mg/dL — AB (ref 6–20)
CREATININE: 7 mg/dL — AB (ref 0.61–1.24)
Calcium, Ion: 1.13 mmol/L — ABNORMAL LOW (ref 1.15–1.40)
Chloride: 98 mmol/L (ref 98–111)
Glucose, Bld: 140 mg/dL — ABNORMAL HIGH (ref 70–99)
HEMATOCRIT: 37 % — AB (ref 39.0–52.0)
Hemoglobin: 12.6 g/dL — ABNORMAL LOW (ref 13.0–17.0)
POTASSIUM: 4.1 mmol/L (ref 3.5–5.1)
Sodium: 137 mmol/L (ref 135–145)
TCO2: 28 mmol/L (ref 22–32)

## 2017-10-29 LAB — GLUCOSE, CAPILLARY: Glucose-Capillary: 132 mg/dL — ABNORMAL HIGH (ref 70–99)

## 2017-10-29 SURGERY — A/V FISTULAGRAM
Anesthesia: LOCAL | Laterality: Left

## 2017-10-29 MED ORDER — SODIUM CHLORIDE 0.9% FLUSH: 3.0000 mL | INTRAVENOUS | Status: DC | PRN

## 2017-10-29 MED ORDER — LIDOCAINE HCL (PF) 1 % IJ SOLN
INTRAMUSCULAR | Status: DC | PRN
  Administered 2017-10-29 (×2): 3 mL

## 2017-10-29 MED ORDER — LIDOCAINE HCL (PF) 1 % IJ SOLN
INTRAMUSCULAR | Status: AC
  Filled 2017-10-29: qty 30

## 2017-10-29 MED ORDER — ACETAMINOPHEN 325 MG PO TABS: 650.0000 mg | ORAL_TABLET | ORAL | Status: DC | PRN

## 2017-10-29 MED ORDER — HYDRALAZINE HCL 20 MG/ML IJ SOLN: 5.0000 mg | INTRAMUSCULAR | Status: DC | PRN

## 2017-10-29 MED ORDER — SODIUM CHLORIDE 0.9 % IV SOLN: 250.0000 mL | INTRAVENOUS | Status: DC | PRN

## 2017-10-29 MED ORDER — IODIXANOL 320 MG/ML IV SOLN
INTRAVENOUS | Status: DC | PRN
  Administered 2017-10-29: 70 mL via INTRAVENOUS

## 2017-10-29 MED ORDER — ONDANSETRON HCL 4 MG/2ML IJ SOLN: 4.0000 mg | Freq: Four times a day (QID) | INTRAMUSCULAR | Status: DC | PRN

## 2017-10-29 MED ORDER — LABETALOL HCL 5 MG/ML IV SOLN: 10.0000 mg | INTRAVENOUS | Status: DC | PRN

## 2017-10-29 MED ORDER — HEPARIN (PORCINE) IN NACL 1000-0.9 UT/500ML-% IV SOLN
INTRAVENOUS | Status: DC | PRN
  Administered 2017-10-29: 500 mL

## 2017-10-29 MED ORDER — FENTANYL CITRATE (PF) 100 MCG/2ML IJ SOLN
INTRAMUSCULAR | Status: DC | PRN
  Administered 2017-10-29: 25 ug via INTRAVENOUS

## 2017-10-29 MED ORDER — MORPHINE SULFATE (PF) 10 MG/ML IV SOLN: 1.0000 mg | INTRAVENOUS | Status: DC | PRN

## 2017-10-29 MED ORDER — HEPARIN (PORCINE) IN NACL 1000-0.9 UT/500ML-% IV SOLN
INTRAVENOUS | Status: AC
  Filled 2017-10-29: qty 500

## 2017-10-29 MED ORDER — HEPARIN SODIUM (PORCINE) 1000 UNIT/ML IJ SOLN
INTRAMUSCULAR | Status: DC | PRN
  Administered 2017-10-29: 3000 [IU] via INTRAVENOUS

## 2017-10-29 MED ORDER — SODIUM CHLORIDE 0.9% FLUSH: 3.0000 mL | Freq: Two times a day (BID) | INTRAVENOUS | Status: DC

## 2017-10-29 MED ORDER — HEPARIN SODIUM (PORCINE) 1000 UNIT/ML IJ SOLN
INTRAMUSCULAR | Status: AC
  Filled 2017-10-29: qty 1

## 2017-10-29 MED ORDER — OXYCODONE HCL 5 MG PO TABS: 5.0000 mg | ORAL_TABLET | ORAL | Status: DC | PRN

## 2017-10-29 MED ORDER — FENTANYL CITRATE (PF) 100 MCG/2ML IJ SOLN
INTRAMUSCULAR | Status: AC
  Filled 2017-10-29: qty 2

## 2017-10-29 SURGICAL SUPPLY — 18 items
BAG SNAP BAND KOVER 36X36 (MISCELLANEOUS) ×3 IMPLANT
BALLN LUTONIX AV 7X40X75 (BALLOONS) ×3
BALLN MUSTANG 4.0X40 75 (BALLOONS) ×3
BALLN MUSTANG 6.0X40 75 (BALLOONS) ×3
BALLOON LUTONIX AV 7X40X75 (BALLOONS) IMPLANT
BALLOON MUSTANG 4.0X40 75 (BALLOONS) ×1 IMPLANT
BALLOON MUSTANG 6.0X40 75 (BALLOONS) ×1 IMPLANT
COVER DOME SNAP 22 D (MISCELLANEOUS) ×3 IMPLANT
KIT ENCORE 26 ADVANTAGE (KITS) ×2 IMPLANT
KIT MICROPUNCTURE NIT STIFF (SHEATH) ×1 IMPLANT
PROTECTION STATION PRESSURIZED (MISCELLANEOUS) ×3
SHEATH PINNACLE R/O II 6F 4CM (SHEATH) ×2 IMPLANT
SHEATH PROBE COVER 6X72 (BAG) ×2 IMPLANT
STATION PROTECTION PRESSURIZED (MISCELLANEOUS) ×2 IMPLANT
TRAY PV CATH (CUSTOM PROCEDURE TRAY) ×3 IMPLANT
TUBING CIL FLEX 10 FLL-RA (TUBING) ×3 IMPLANT
WIRE BENTSON .035X145CM (WIRE) ×1 IMPLANT
WIRE TORQFLEX AUST .018X40CM (WIRE) ×2 IMPLANT

## 2017-10-29 NOTE — Op Note (Addendum)
Procedure: Ultrasound left arm AV fistula, cannulation left arm fistula proximal and distal sites, angioplasty fistula 2 separate sites (7 x 40 Lutonix)  Preoperative diagnosis: Non-maturing AV fistula left arm  Postoperative diagnosis: Same  Anesthesia: Local  Operative findings: #1 2.5 cm in length 70% narrowing midportion of fistula angioplastied to 7 mm 0 residual stenosis  #2 1 cm length 50% narrowing just below the antecubital crease angioplastied to 7 mm 0 residual stenosis  Operative details: After obtaining informed consent, the patient was taken the PV lab.  The patient placed in supine position Angio table.  The left upper extremity was prepped and draped in usual sterile fashion.  Ultrasound was used to identify the patient's left arm AV fistula.  The fistula was noted to be patent.  A permanent image was obtained to place in the patient's record.  Local anesthesia was infiltrated about a third of the way from the antecubital crease to the wrist.  Micropuncture needle was used to cannulate the AV fistula in this location.  A micropuncture wire was advanced into the fistula.  The micropuncture sheath placed over this.  Contrast angiogram was then obtained.  This showed a 70% narrowing at the midportion of the fistula and our catheter was just below this.  The central veins were all patent.  The remainder of the fistula was patent except for 150% narrowing just below the antecubital crease.  At this point a figure-of-eight Monocryl stitch was placed at the level of the sheath sheath removed and hemostasis obtained with direct pressure.  I then used ultrasound again to identify the fistula more towards the wrist.  Local anesthesia was infiltrated in this location micropuncture needle was again used to cannulate the fistula and we then advanced the micropuncture sheath antegrade up the vein towards the central veins.  An 19 Bentson wire was placed through the micropuncture sheath and this was  swapped out for a 6 [place] short sheath.  This was thoroughly flushed with heparin I saline.  The patient was given 3000 units of intravenous heparin.  A 4 mm x 40 angioplasty balloon was advanced and centered on the lesion.  This was inflated to nominal pressure for 1 minute.  Contrast angiogram showed no extravasation but the fistula was quite small still.  At this point a 640 balloon was brought up on the operative field and this was advanced and centered on the lesion and inflated to nominal pressure for 1 minute.  There was significant improvement in the stenosis although the fistula was still fairly small.  At this point a 740 Lutonix balloon was brought up on the field and inflated to nominal pressure for 2 minutes.  This was then deflated and then centered on a 50% lesion up near the antecubital crease.  This was inflated to nominal pressure for 1 minute.  Completion angiogram showed some extravasation of contrast at the distal lesion but this resolved after about 1 minute.  There was also some extravasation around the sheath so rather than leave the sheath in place this was pulled and hemostasis obtained with direct pressure and an additional figure-of-eight Monocryl stitch.  The patient tolerated the procedure well and there were no complications.  The patient was taken to the holding area in stable condition.  Operative management: The patient had angioplasty of 2 discrete lesions in the left arm AV fistula with a 7 mm today.  He will return to our office in a few weeks for follow-up ultrasound.  If the  fistula has failed to mature at that point we will need to consider a new access.  I do not believe an additional angioplasty would be of benefit if this fails in the interim.  Ruta Hinds, MD [facility] Office: [phone] Pager: [phone]

## 2017-10-29 NOTE — Discharge Instructions (Signed)
**Note Jilene Spohr-identified via Obfuscation** Fistulogram, Care After °Refer to this sheet in the next few weeks. These instructions provide you with information on caring for yourself after your procedure. Your health care provider may also give you more specific instructions. Your treatment has been planned according to current medical practices, but problems sometimes occur. Call your health care provider if you have any problems or questions after your procedure. °What can I expect after the procedure? °After your procedure, it is typical to have the following: °· A small amount of discomfort in the area where the catheters were placed. °· A small amount of bruising around the fistula. °· Sleepiness and fatigue. ° °Follow these instructions at home: °· Rest at home for the day following your procedure. °· Do not drive or operate heavy machinery while taking pain medicine. °· Take medicines only as directed by your health care provider. °· Do not take baths, swim, or use a hot tub until your health care provider approves. You may shower 24 hours after the procedure or as directed by your health care provider. °· There are many different ways to close and cover an incision, including stitches, skin glue, and adhesive strips. Follow your health care provider's instructions on: °? Incision care. °? Bandage (dressing) changes and removal. °? Incision closure removal. °· Monitor your dialysis fistula carefully. °Contact a health care provider if: °· You have drainage, redness, swelling, or pain at your catheter site. °· You have a fever. °· You have chills. °Get help right away if: °· You feel weak. °· You have trouble balancing. °· You have trouble moving your arms or legs. °· You have problems with your speech or vision. °· You can no longer feel a vibration or buzz when you put your fingers over your dialysis fistula. °· The limb that was used for the procedure: °? Swells. °? Is painful. °? Is cold. °? Is discolored, such as blue or pale white. °This  information is not intended to replace advice given to you by your health care provider. Make sure you discuss any questions you have with your health care provider. °Document Released: 07/17/2013 Document Revised: 08/08/2015 Document Reviewed: 04/21/2013 °Elsevier Interactive Patient Education © 2018 [employer] ° °

## 2017-10-29 NOTE — Interval H&P Note (Signed)
History and Physical Interval Note:  10/29/2017 7:24 AM  John Bailey  has presented today for surgery, with the diagnosis of Complication  The various methods of treatment have been discussed with the patient and family. After consideration of risks, benefits and other options for treatment, the patient has consented to  Procedure(s): A/V FISTULAGRAM (Left) as a surgical intervention .  The patient's history has been reviewed, patient examined, no change in status, stable for surgery.  I have reviewed the patient's chart and labs.  Questions were answered to the patient's satisfaction.     Ruta Hinds

## 2017-11-01 ENCOUNTER — Telehealth: Payer: Self-pay | Admitting: Vascular Surgery

## 2017-11-01 NOTE — Telephone Encounter (Signed)
ch appt lvm 11/18/17 1pm dialysis Duplex 2pm p/o PA

## 2017-11-04 ENCOUNTER — Other Ambulatory Visit: Payer: Self-pay

## 2017-11-04 DIAGNOSIS — Z992 Dependence on renal dialysis: Principal | ICD-10-CM

## 2017-11-04 DIAGNOSIS — N186 End stage renal disease: Secondary | ICD-10-CM

## 2017-11-05 ENCOUNTER — Other Ambulatory Visit (HOSPITAL_COMMUNITY)
Admission: RE | Admit: 2017-11-05 | Discharge: 2017-11-05 | Disposition: A | Discharge status: Home / Self Care | Payer: BLUE CROSS/BLUE SHIELD | Source: Other Acute Inpatient Hospital | Attending: Internal Medicine | Admitting: Internal Medicine

## 2017-11-05 DIAGNOSIS — L89154 Pressure ulcer of sacral region, stage 4: Secondary | ICD-10-CM | POA: Insufficient documentation

## 2017-11-08 LAB — AEROBIC CULTURE W GRAM STAIN (SUPERFICIAL SPECIMEN)

## 2017-11-08 LAB — AEROBIC CULTURE  (SUPERFICIAL SPECIMEN)

## 2017-11-12 DIAGNOSIS — L89154 Pressure ulcer of sacral region, stage 4: Secondary | ICD-10-CM | POA: Diagnosis not present

## 2017-11-18 ENCOUNTER — Ambulatory Visit (HOSPITAL_COMMUNITY)
Admission: RE | Admit: 2017-11-18 | Discharge: 2017-11-18 | Disposition: A | Discharge status: Home / Self Care | Payer: BLUE CROSS/BLUE SHIELD | Source: Ambulatory Visit | Attending: Vascular Surgery | Admitting: Vascular Surgery

## 2017-11-18 ENCOUNTER — Other Ambulatory Visit: Payer: Self-pay

## 2017-11-18 ENCOUNTER — Ambulatory Visit (INDEPENDENT_AMBULATORY_CARE_PROVIDER_SITE_OTHER): Payer: BLUE CROSS/BLUE SHIELD | Admitting: Physician Assistant

## 2017-11-18 VITALS — BP 122/88 | HR 90 | Temp 98.2°F | Resp 18 | Ht 71.0 in | Wt 273.0 lb

## 2017-11-18 DIAGNOSIS — Z992 Dependence on renal dialysis: Secondary | ICD-10-CM

## 2017-11-18 DIAGNOSIS — N186 End stage renal disease: Secondary | ICD-10-CM

## 2017-11-18 DIAGNOSIS — Z4931 Encounter for adequacy testing for hemodialysis: Secondary | ICD-10-CM | POA: Diagnosis present

## 2017-11-18 NOTE — Progress Notes (Signed)
HISTORY AND PHYSICAL     CC:  Follow up. Requesting Provider:  [name], MD  HPI: This is a 47 y.o. male left radial cephalic AVF on 0/62/37 by Dr. Donnetta Hutching.  He is here today for follow up.  He is on dialysis on T/T/S at the center on [address].  He dialyzes via a tunneled dialysis catheter that was placed on 05/04/17 by Dr. Bridgett Larsson.  He states that he does have some numbness in his fingertips on the left hand and he has a difficult time with grip.  The day after surgery, he did have tingling in his fingers that was present prior to the creation of the fistula.  He states that he does have rheumatoid arthritis.  He states that he is unsure when he started having trouble with his hand.    He is on Eliquis for afib.  He is on a beta blocker.  He underwent debridement of large area involving subcutaneous tissue, fascia and muscle of the right gluteal area on 06/14/17 and continues with wound vac for healing.   He is on insulin for diabetes.  Since his last visit, the wound vac has been removed and he is making progress.    He underrwent Ultrasound left arm AV fistula, cannulation left arm fistula proximal and distal sites, angioplasty fistula 2 separate sites (7 x 40 Lutonix) by Dr. Oneida Alar on 10/29/17 by Dr. Oneida Alar.  He returns today for duplex of his fistula and follow up.    Past Medical History:  Diagnosis Date  . Atrial flutter with rapid ventricular response ([place]) 08/06/2015   a. s/p DCCV in 07/2015 with recurrent atrial fibrillation and DCCV in 12/2016. Initially successful but noted to be back in atrial fibrillation within 2 weeks of DCCV.   [name] Chronic diastolic (congestive) heart failure (HCC)    a. EF 50-55% by echo in 06/2016.  . Diabetes ([place])   . Dysrhythmia    Aflutter  . ESRD (end stage renal disease) on dialysis ([place])    "TTS; at [facility]" (06/08/2017)  . Hypertension   . Peripheral vascular disease ([place])   . Rheumatoid arthritis [facility])     Past Surgical History:    Procedure Laterality Date  . A/V FISTULAGRAM Left 10/29/2017   Procedure: A/V FISTULAGRAM;  Surgeon: Elam Dutch, MD;  Location: [place] CV LAB;  Service: Cardiovascular;  Laterality: Left;  . AV FISTULA PLACEMENT Left 05/10/2017   Procedure: CREATION of Left Radicephalic Fistula;  Surgeon: Rosetta Posner, MD;  Location: [place];  Service: Vascular;  Laterality: Left;  . CARDIOVERSION N/A 08/01/2015   Procedure: CARDIOVERSION;  Surgeon: Josue Hector, MD;  Location: [facility] ENDOSCOPY;  Service: Cardiovascular;  Laterality: N/A;  . CARDIOVERSION N/A 12/18/2016   Procedure: CARDIOVERSION;  Surgeon: [name], MD;  Location: [facility] ENDOSCOPY;  Service: Cardiovascular;  Laterality: N/A;  . CARDIOVERSION N/A 04/30/2017   Procedure: CARDIOVERSION;  Surgeon: Larey Dresser, MD;  Location: [facility] ENDOSCOPY;  Service: Cardiovascular;  Laterality: N/A;  . CHOLECYSTECTOMY    . DEBRIDMENT OF DECUBITUS ULCER N/A 06/04/2017   Procedure: DEBRIDMENT OF SACRAL DECUBITUS ULCER;  Surgeon: Georganna Skeans, MD;  Location: [place];  Service: General;  Laterality: N/A;  . INCISION AND DRAINAGE ABSCESS N/A 06/14/2017   Procedure: INCISION AND DRAINAGE GLUTEAL ABSCESS;  Surgeon: Kieth Brightly Arta Bruce, MD;  Location: [place];  Service: General;  Laterality: N/A;  Packing to coccyx with packing and drain to right buttock  . INSERTION OF DIALYSIS  CATHETER Right 05/04/2017   Procedure: INSERTION OF TUNNELED  DIALYSIS CATHETER;  Surgeon: Conrad Sibley, MD;  Location: [place];  Service: Vascular;  Laterality: Right;  . IR FLUORO GUIDE CV LINE RIGHT  04/27/2017  . IR US GUIDE VASC ACCESS RIGHT  04/27/2017  . PERIPHERAL VASCULAR BALLOON ANGIOPLASTY  10/29/2017   Procedure: PERIPHERAL VASCULAR BALLOON ANGIOPLASTY;  Surgeon: Elam Dutch, MD;  Location: [place] CV LAB;  Service: Cardiovascular;;  Lt arm Fistula   . TEE WITHOUT CARDIOVERSION N/A 08/01/2015   Procedure: TRANSESOPHAGEAL ECHOCARDIOGRAM (TEE);  Surgeon: Josue Hector,  MD;  Location: [facility] ENDOSCOPY;  Service: Cardiovascular;  Laterality: N/A;    No Known Allergies  Current Outpatient Medications  Medication Sig Dispense Refill  . amiodarone (PACERONE) 200 MG tablet Take 1 tablet (200 mg total) by mouth daily. 30 tablet 0  . apixaban (ELIQUIS) 5 MG TABS tablet Take 1 tablet (5 mg total) by mouth 2 (two) times daily. 60 tablet 0  . B Complex-C-Folic Acid (DIALYVITE PO) Take 1 tablet by mouth daily with supper.    . calcitRIOL (ROCALTROL) 0.5 MCG capsule Take 1 capsule (0.5 mcg total) by mouth every Tuesday, Thursday, and Saturday at 6 PM. 90 capsule 0  . carvedilol (COREG) 6.25 MG tablet Take 1 tablet (6.25 mg total) by mouth 2 (two) times daily with a meal. 60 tablet 0  . collagenase (SANTYL) ointment Apply topically daily. (Patient not taking: Reported on 10/27/2017)    . diclofenac sodium (VOLTAREN) 1 % GEL Apply 4 g topically 2 (two) times daily as needed (apply over affected area). (Patient taking differently: Apply 4 g topically 2 (two) times daily as needed (for pain). )    . docusate sodium (COLACE) 100 MG capsule Take 100 mg by mouth daily as needed for mild constipation.    . feeding supplement, GLUCERNA SHAKE, (GLUCERNA SHAKE) LIQD Take 237 mLs by mouth 3 (three) times daily between meals. (Patient not taking: Reported on 10/27/2017)  0  . gabapentin (NEURONTIN) 300 MG capsule Take 300 mg by mouth daily.    . insulin glargine (LANTUS) 100 UNIT/ML injection Inject 0.15 mLs (15 Units total) into the skin at bedtime.    [name] (FOSRENOL) 500 MG chewable tablet Chew 0.5 tablets (250 mg total) by mouth 3 (three) times daily with meals. (Patient taking differently: Chew 500 mg by mouth 3 (three) times daily with meals. )    . NOVOLOG FLEXPEN 100 UNIT/ML FlexPen Inject 2-12 Units as directed 3 (three) times daily with meals. Per sliding scale  0  . predniSONE (DELTASONE) 10 MG tablet Take 1 tablet (10 mg total) by mouth daily with breakfast. Last dose 4/25  (Patient taking differently: Take 7.5 mg by mouth daily with breakfast. )     No current facility-administered medications for this visit.     Family History  Problem Relation Age of Onset  . Heart disease Mother   . Hyperlipidemia Father   . Hypertension Father     Social History   Socioeconomic History  . Marital status: Married    Spouse name: Not on file  . Number of children: Not on file  . Years of education: Not on file  . Highest education level: Not on file  Occupational History  . Not on file  Social Needs  . Financial resource strain: Not on file  . Food insecurity:    Worry: Not on file    Inability: Not on file  . Transportation  needs:    Medical: Not on file    Non-medical: Not on file  Tobacco Use  . Smoking status: Never Smoker  . Smokeless tobacco: Never Used  Substance and Sexual Activity  . Alcohol use: No  . Drug use: No  . Sexual activity: Yes  Lifestyle  . Physical activity:    Days per week: Not on file    Minutes per session: Not on file  . Stress: Not on file  Relationships  . Social connections:    Talks on phone: Not on file    Gets together: Not on file    Attends religious service: Not on file    Active member of club or organization: Not on file    Attends meetings of clubs or organizations: Not on file    Relationship status: Not on file  . Intimate partner violence:    Fear of current or ex partner: Not on file    Emotionally abused: Not on file    Physically abused: Not on file    Forced sexual activity: Not on file  Other Topics Concern  . Not on file  Social History Narrative  . Not on file     REVIEW OF SYSTEMS:  No changes since last visit with exception of removal of wound vac [X]  denotes positive finding, [ ]  denotes negative finding Cardiac  Comments:  Chest pain or chest pressure:    Shortness of breath upon exertion:    Short of breath when lying flat:    Irregular heart rhythm:        Vascular    Pain  in calf, thigh, or hip brought on by ambulation:    Pain in feet at night that wakes you up from your sleep:     Blood clot in your veins:    Leg swelling:         Pulmonary    Oxygen at home:    Productive cough:     Wheezing:         Neurologic    Sudden weakness in arms or legs:     Sudden numbness in arms or legs:     Sudden onset of difficulty speaking or slurred speech:    Temporary loss of vision in one eye:     Problems with dizziness:         Gastrointestinal    Blood in stool:     Vomited blood:         Genitourinary    Burning when urinating:     Blood in urine:        Psychiatric    Major depression:         Hematologic    Bleeding problems:    Problems with blood clotting too easily:        Skin    Rashes or ulcers:        Constitutional    Fever or chills:      PHYSICAL EXAMINATION:  Vitals:   11/18/17 1343  BP: 122/88  Pulse: 90  Resp: 18  Temp: 98.2 F (36.8 C)  SpO2: 95%   Vitals:   11/18/17 1343  Weight: 273 lb (123.8 kg)  Height: 5\' 11"  (1.803 m)   Body mass index is 38.08 kg/m.  General:  WDWN in NAD; vital signs documented above Gait: Normal HENT: WNL, normocephalic Pulmonary: normal non-labored breathing Cardiac: regular heart rate Skin: without rashes; two sutures present left forearm Vascular Exam/Pulses: +thrill left forearm fistula  and easily palpable to midway up the arm. +palpable left radial pulse Extremities: without ischemic changes, without Gangrene , without cellulitis; without open wounds; left hand warm<right hand; moving fingers better than at last visit and able to grip a little better.  Musculoskeletal: no muscle wasting or atrophy  Neurologic: A&O X 3;  No focal weakness or paresthesias are detected Psychiatric:  The pt has Normal affect.   Non-Invasive Vascular Imaging:   Dialysis duplex 11/18/17: Diameter:  0.52cm-0.71cm Depth:  0.33cm-0.5cm  Previous duplex 10/25/17: Diameter:  0.23cm-0.66cm Depth:   0.19cm-1.47cm (proximal UA)  Pt meds includes: Statin:  No. Beta Blocker:  Yes.   Aspirin:  No. ACEI:  No. ARB:  No. CCB use:  No Other Antiplatelet/Anticoagulant:  Yes Eliquis   ASSESSMENT/PLAN:: 47 y.o. male s/p Ultrasound left arm AV fistula, cannulation left arm fistula proximal and distal sites, angioplasty fistula 2 separate sites (7 x 40 Lutonix) on 10/29/17 by Dr. Oneida Alar.  (original creation of left RC AVF on 05/10/17 by Dr. Donnetta Hutching).   -pt returns today for repeat duplex of fistula after intervention on 10/29/17.  The diameter has improved but still with slight narrowing of 0.52cm at the antecubital fossa.  Discussed results with Dr. Oneida Alar and recommends going ahead and cannulating fistula.   Pt states he would like numbing cream before sticking.  -once his fistula has been used successfully several times, he can be scheduled for tunneled catheter removal.  He will need to be off his Eliquis prior to removal.  -he will f/u with [facility] as needed.    [name], PA-C [facility] [phone]  Clinic MD:  Oneida Alar

## 2017-11-19 ENCOUNTER — Encounter (HOSPITAL_BASED_OUTPATIENT_CLINIC_OR_DEPARTMENT_OTHER): Payer: BLUE CROSS/BLUE SHIELD | Attending: Internal Medicine

## 2017-11-19 DIAGNOSIS — E1140 Type 2 diabetes mellitus with diabetic neuropathy, unspecified: Secondary | ICD-10-CM | POA: Diagnosis not present

## 2017-11-19 DIAGNOSIS — I110 Hypertensive heart disease with heart failure: Secondary | ICD-10-CM | POA: Insufficient documentation

## 2017-11-19 DIAGNOSIS — Z794 Long term (current) use of insulin: Secondary | ICD-10-CM | POA: Insufficient documentation

## 2017-11-19 DIAGNOSIS — I2510 Atherosclerotic heart disease of native coronary artery without angina pectoris: Secondary | ICD-10-CM | POA: Insufficient documentation

## 2017-11-19 DIAGNOSIS — L89154 Pressure ulcer of sacral region, stage 4: Secondary | ICD-10-CM | POA: Insufficient documentation

## 2017-11-19 DIAGNOSIS — I509 Heart failure, unspecified: Secondary | ICD-10-CM | POA: Insufficient documentation

## 2017-11-26 DIAGNOSIS — L89154 Pressure ulcer of sacral region, stage 4: Secondary | ICD-10-CM | POA: Diagnosis not present

## 2017-12-01 ENCOUNTER — Other Ambulatory Visit: Payer: Self-pay

## 2017-12-01 DIAGNOSIS — R600 Localized edema: Secondary | ICD-10-CM

## 2017-12-01 DIAGNOSIS — N186 End stage renal disease: Secondary | ICD-10-CM

## 2017-12-01 DIAGNOSIS — Z992 Dependence on renal dialysis: Principal | ICD-10-CM

## 2017-12-03 DIAGNOSIS — L89154 Pressure ulcer of sacral region, stage 4: Secondary | ICD-10-CM | POA: Diagnosis not present

## 2017-12-10 DIAGNOSIS — L89154 Pressure ulcer of sacral region, stage 4: Secondary | ICD-10-CM | POA: Diagnosis not present

## 2017-12-13 ENCOUNTER — Ambulatory Visit: Payer: BLUE CROSS/BLUE SHIELD

## 2017-12-13 ENCOUNTER — Encounter (HOSPITAL_COMMUNITY): Payer: BLUE CROSS/BLUE SHIELD

## 2017-12-17 ENCOUNTER — Encounter (HOSPITAL_BASED_OUTPATIENT_CLINIC_OR_DEPARTMENT_OTHER): Payer: BLUE CROSS/BLUE SHIELD | Attending: Internal Medicine

## 2017-12-17 ENCOUNTER — Ambulatory Visit (HOSPITAL_COMMUNITY): Payer: BLUE CROSS/BLUE SHIELD

## 2017-12-17 ENCOUNTER — Ambulatory Visit: Payer: BLUE CROSS/BLUE SHIELD

## 2017-12-17 DIAGNOSIS — E1140 Type 2 diabetes mellitus with diabetic neuropathy, unspecified: Secondary | ICD-10-CM | POA: Diagnosis not present

## 2017-12-17 DIAGNOSIS — L89154 Pressure ulcer of sacral region, stage 4: Secondary | ICD-10-CM | POA: Diagnosis not present

## 2017-12-17 DIAGNOSIS — I2510 Atherosclerotic heart disease of native coronary artery without angina pectoris: Secondary | ICD-10-CM | POA: Insufficient documentation

## 2017-12-17 DIAGNOSIS — I509 Heart failure, unspecified: Secondary | ICD-10-CM | POA: Diagnosis not present

## 2017-12-17 DIAGNOSIS — I110 Hypertensive heart disease with heart failure: Secondary | ICD-10-CM | POA: Insufficient documentation

## 2017-12-17 NOTE — Progress Notes (Deleted)
[facility] [place] HISTORY AND PHYSICAL   History of Present Illness:  S/P Ultrasound left arm AV fistula, cannulation left arm fistula proximal and distal sites, angioplasty fistula 2 separate sites (7 x 40 Lutonix).   Post procedure discussion: The patient had angioplasty of 2 discrete lesions in the left arm AV fistula with a 7 mm today.  He will return to our office in a few weeks for follow-up ultrasound.  If the fistula has failed to mature at that point we will need to consider a new access.  I do not believe an additional angioplasty would be of benefit if this fails in the interim.    He is here for follow post procedure today and repeat access duplex.    Past Medical History:  Diagnosis Date  . Atrial flutter with rapid ventricular response ([place]) 08/06/2015   a. s/p DCCV in 07/2015 with recurrent atrial fibrillation and DCCV in 12/2016. Initially successful but noted to be back in atrial fibrillation within 2 weeks of DCCV.   [name] Chronic diastolic (congestive) heart failure (HCC)    a. EF 50-55% by echo in 06/2016.  . Diabetes ([place])   . Dysrhythmia    Aflutter  . ESRD (end stage renal disease) on dialysis ([place])    "TTS; at [facility]" (06/08/2017)  . Hypertension   . Peripheral vascular disease ([place])   . Rheumatoid arthritis [facility])     Past Surgical History:  Procedure Laterality Date  . A/V FISTULAGRAM Left 10/29/2017   Procedure: A/V FISTULAGRAM;  Surgeon: Elam Dutch, MD;  Location: [place] CV LAB;  Service: Cardiovascular;  Laterality: Left;  . AV FISTULA PLACEMENT Left 05/10/2017   Procedure: CREATION of Left Radicephalic Fistula;  Surgeon: Rosetta Posner, MD;  Location: [place];  Service: Vascular;  Laterality: Left;  . CARDIOVERSION N/A 08/01/2015   Procedure: CARDIOVERSION;  Surgeon: Josue Hector, MD;  Location: [facility] ENDOSCOPY;  Service: Cardiovascular;  Laterality: N/A;  . CARDIOVERSION N/A 12/18/2016   Procedure: CARDIOVERSION;  Surgeon: [name], MD;  Location: [facility] ENDOSCOPY;  Service: Cardiovascular;  Laterality: N/A;  . CARDIOVERSION N/A 04/30/2017   Procedure: CARDIOVERSION;  Surgeon: Larey Dresser, MD;  Location: [facility] ENDOSCOPY;  Service: Cardiovascular;  Laterality: N/A;  . CHOLECYSTECTOMY    . DEBRIDMENT OF DECUBITUS ULCER N/A 06/04/2017   Procedure: DEBRIDMENT OF SACRAL DECUBITUS ULCER;  Surgeon: Georganna Skeans, MD;  Location: [place];  Service: General;  Laterality: N/A;  . INCISION AND DRAINAGE ABSCESS N/A 06/14/2017   Procedure: INCISION AND DRAINAGE GLUTEAL ABSCESS;  Surgeon: Kieth Brightly Arta Bruce, MD;  Location: [place];  Service: General;  Laterality: N/A;  Packing to coccyx with packing and drain to right buttock  . INSERTION OF DIALYSIS CATHETER Right 05/04/2017   Procedure: INSERTION OF TUNNELED  DIALYSIS CATHETER;  Surgeon: Conrad Truro, MD;  Location: [place];  Service: Vascular;  Laterality: Right;  . IR FLUORO GUIDE CV LINE RIGHT  04/27/2017  . IR US GUIDE VASC ACCESS RIGHT  04/27/2017  . PERIPHERAL VASCULAR BALLOON ANGIOPLASTY  10/29/2017   Procedure: PERIPHERAL VASCULAR BALLOON ANGIOPLASTY;  Surgeon: Elam Dutch, MD;  Location: [place] CV LAB;  Service: Cardiovascular;;  Lt arm Fistula   . TEE WITHOUT CARDIOVERSION N/A 08/01/2015   Procedure: TRANSESOPHAGEAL ECHOCARDIOGRAM (TEE);  Surgeon: Josue Hector, MD;  Location: [facility] ENDOSCOPY;  Service: Cardiovascular;  Laterality: N/A;     Social History Social History   Tobacco Use  . Smoking status:  Never Smoker  . Smokeless tobacco: Never Used  Substance Use Topics  . Alcohol use: No  . Drug use: No    Family History Family History  Problem Relation Age of Onset  . Heart disease Mother   . Hyperlipidemia Father   . Hypertension Father     Allergies  No Known Allergies   Current Outpatient Medications  Medication Sig Dispense Refill  . amiodarone (PACERONE) 200 MG tablet Take 1 tablet (200 mg total) by mouth daily. 30 tablet 0  . apixaban  (ELIQUIS) 5 MG TABS tablet Take 1 tablet (5 mg total) by mouth 2 (two) times daily. 60 tablet 0  . B Complex-C-Folic Acid (DIALYVITE PO) Take 1 tablet by mouth daily with supper.    . calcitRIOL (ROCALTROL) 0.5 MCG capsule Take 1 capsule (0.5 mcg total) by mouth every Tuesday, Thursday, and Saturday at 6 PM. 90 capsule 0  . carvedilol (COREG) 6.25 MG tablet Take 1 tablet (6.25 mg total) by mouth 2 (two) times daily with a meal. 60 tablet 0  . collagenase (SANTYL) ointment Apply topically daily.    . diclofenac sodium (VOLTAREN) 1 % GEL Apply 4 g topically 2 (two) times daily as needed (apply over affected area). (Patient taking differently: Apply 4 g topically 2 (two) times daily as needed (for pain). )    . docusate sodium (COLACE) 100 MG capsule Take 100 mg by mouth daily as needed for mild constipation.    . feeding supplement, GLUCERNA SHAKE, (GLUCERNA SHAKE) LIQD Take 237 mLs by mouth 3 (three) times daily between meals.  0  . gabapentin (NEURONTIN) 300 MG capsule Take 300 mg by mouth daily.    . insulin glargine (LANTUS) 100 UNIT/ML injection Inject 0.15 mLs (15 Units total) into the skin at bedtime.    [name] (FOSRENOL) 500 MG chewable tablet Chew 0.5 tablets (250 mg total) by mouth 3 (three) times daily with meals. (Patient taking differently: Chew 500 mg by mouth 3 (three) times daily with meals. )    . NOVOLOG FLEXPEN 100 UNIT/ML FlexPen Inject 2-12 Units as directed 3 (three) times daily with meals. Per sliding scale  0  . predniSONE (DELTASONE) 10 MG tablet Take 1 tablet (10 mg total) by mouth daily with breakfast. Last dose 4/25 (Patient taking differently: Take 7.5 mg by mouth daily with breakfast. )     No current facility-administered medications for this visit.     ROS:   General:  No weight loss, Fever, chills  HEENT: No recent headaches, no nasal bleeding, no visual changes, no sore throat  Neurologic: No dizziness, blackouts, seizures. No recent symptoms of stroke or  mini- stroke. No recent episodes of slurred speech, or temporary blindness.  Cardiac: No recent episodes of chest pain/pressure, no shortness of breath at rest.  No shortness of breath with exertion.  Denies history of atrial fibrillation or irregular heartbeat  Vascular: No history of rest pain in feet.  No history of claudication.  No history of non-healing ulcer, No history of DVT   Pulmonary: No home oxygen, no productive cough, no hemoptysis,  No asthma or wheezing  Musculoskeletal:  [ ]  Arthritis, [ ]  Low back pain,  [ ]  Joint pain  Hematologic:No history of hypercoagulable state.  No history of easy bleeding.  No history of anemia  Gastrointestinal: No hematochezia or melena,  No gastroesophageal reflux, no trouble swallowing  Urinary: [ ]  chronic Kidney disease, [ ]  on HD - [ ]  MWF or [ ]   TTHS, [ ]  Burning with urination, [ ]  Frequent urination, [ ]  Difficulty urinating;   Skin: No rashes  Psychological: No history of anxiety,  No history of depression   Physical Examination  There were no vitals filed for this visit.  There is no height or weight on file to calculate BMI.  General:  Alert and oriented, no acute distress HEENT: Normal Neck: No bruit or JVD Pulmonary: Clear to auscultation bilaterally Cardiac: Regular Rate and Rhythm without murmur Gastrointestinal: Soft, non-tender, non-distended, no mass, no scars Skin: No rash Extremity Pulses:  2+ radial, brachial pulses bilaterally Musculoskeletal: No deformity or edema  Neurologic: Upper and lower extremity motor 5/5 and symmetric  DATA:    ASSESSMENT:    PLAN:  Roxy Horseman , PA-C [facility] [place] Office: [phone]

## 2017-12-24 DIAGNOSIS — L89154 Pressure ulcer of sacral region, stage 4: Secondary | ICD-10-CM | POA: Diagnosis not present

## 2017-12-31 DIAGNOSIS — L89154 Pressure ulcer of sacral region, stage 4: Secondary | ICD-10-CM | POA: Diagnosis not present

## 2018-01-05 ENCOUNTER — Encounter (HOSPITAL_COMMUNITY): Payer: BLUE CROSS/BLUE SHIELD

## 2018-01-05 ENCOUNTER — Ambulatory Visit: Payer: BLUE CROSS/BLUE SHIELD

## 2018-01-14 ENCOUNTER — Encounter (HOSPITAL_BASED_OUTPATIENT_CLINIC_OR_DEPARTMENT_OTHER): Payer: BLUE CROSS/BLUE SHIELD | Attending: Internal Medicine

## 2018-01-14 DIAGNOSIS — E1140 Type 2 diabetes mellitus with diabetic neuropathy, unspecified: Secondary | ICD-10-CM | POA: Diagnosis not present

## 2018-01-14 DIAGNOSIS — I2510 Atherosclerotic heart disease of native coronary artery without angina pectoris: Secondary | ICD-10-CM | POA: Insufficient documentation

## 2018-01-14 DIAGNOSIS — Z09 Encounter for follow-up examination after completed treatment for conditions other than malignant neoplasm: Secondary | ICD-10-CM | POA: Insufficient documentation

## 2018-01-14 DIAGNOSIS — Z872 Personal history of diseases of the skin and subcutaneous tissue: Secondary | ICD-10-CM | POA: Diagnosis not present

## 2018-01-14 DIAGNOSIS — I509 Heart failure, unspecified: Secondary | ICD-10-CM | POA: Diagnosis not present

## 2018-01-14 DIAGNOSIS — I110 Hypertensive heart disease with heart failure: Secondary | ICD-10-CM | POA: Diagnosis not present

## 2018-01-19 ENCOUNTER — Ambulatory Visit (INDEPENDENT_AMBULATORY_CARE_PROVIDER_SITE_OTHER): Payer: BLUE CROSS/BLUE SHIELD | Admitting: Physician Assistant

## 2018-01-19 ENCOUNTER — Ambulatory Visit (HOSPITAL_COMMUNITY)
Admission: RE | Admit: 2018-01-19 | Discharge: 2018-01-19 | Disposition: A | Discharge status: Home / Self Care | Payer: BLUE CROSS/BLUE SHIELD | Source: Ambulatory Visit | Attending: Surgery | Admitting: Surgery

## 2018-01-19 ENCOUNTER — Other Ambulatory Visit: Payer: Self-pay

## 2018-01-19 VITALS — BP 153/98 | HR 96 | Temp 98.5°F | Resp 18 | Ht 71.0 in | Wt 287.0 lb

## 2018-01-19 DIAGNOSIS — Z992 Dependence on renal dialysis: Secondary | ICD-10-CM | POA: Diagnosis present

## 2018-01-19 DIAGNOSIS — N186 End stage renal disease: Secondary | ICD-10-CM

## 2018-01-19 DIAGNOSIS — R600 Localized edema: Secondary | ICD-10-CM | POA: Insufficient documentation

## 2018-01-19 NOTE — Progress Notes (Signed)
[facility]  HISTORY AND PHYSICAL   History of Present Illness:  This is a 47 y.o. male left radial cephalic AVF on 9/62/95 by Dr. Donnetta Hutching. He is here today for follow up. He is on dialysis on T/T/S at the center on [address].He dialyzes via a tunneled dialysis catheter that was placed on 05/04/17 by Dr. Bridgett Larsson.   On his last f/u visit at our office on 11/18/2017 there was slight narrowing of 0.52cm at the antecubital fossa.  Discussed results with Dr. Oneida Alar and recommends going ahead and cannulating fistula.    He is here today with a report of difficult cannulation of the left AVF.  He wants it reviewed and to see if he should allow them to try and stick the fistula again.  He denise pain and loss of sensation in the left UE.  He does have poor motor control secondary to rheumatoid arthritis in the left hand.        Past Medical History:  Diagnosis Date  . Atrial flutter with rapid ventricular response ([place]) 08/06/2015   a. s/p DCCV in 07/2015 with recurrent atrial fibrillation and DCCV in 12/2016. Initially successful but noted to be back in atrial fibrillation within 2 weeks of DCCV.   [name] Chronic diastolic (congestive) heart failure (HCC)    a. EF 50-55% by echo in 06/2016.  . Diabetes ([place])   . Dysrhythmia    Aflutter  . ESRD (end stage renal disease) on dialysis ([place])    "TTS; at [facility]" (06/08/2017)  . Hypertension   . Peripheral vascular disease ([place])   . Rheumatoid arthritis [facility])     Past Surgical History:  Procedure Laterality Date  . A/V FISTULAGRAM Left 10/29/2017   Procedure: A/V FISTULAGRAM;  Surgeon: Elam Dutch, MD;  Location: [place] CV [facility];  Service: Cardiovascular;  Laterality: Left;  . AV FISTULA PLACEMENT Left 05/10/2017   Procedure: CREATION of Left Radicephalic Fistula;  Surgeon: Rosetta Posner, MD;  Location: [place];  Service: Vascular;  Laterality: Left;  . CARDIOVERSION N/A 08/01/2015   Procedure: CARDIOVERSION;  Surgeon:  Josue Hector, MD;  Location: [facility] ENDOSCOPY;  Service: Cardiovascular;  Laterality: N/A;  . CARDIOVERSION N/A 12/18/2016   Procedure: CARDIOVERSION;  Surgeon: [name], MD;  Location: [facility] ENDOSCOPY;  Service: Cardiovascular;  Laterality: N/A;  . CARDIOVERSION N/A 04/30/2017   Procedure: CARDIOVERSION;  Surgeon: Larey Dresser, MD;  Location: [facility] ENDOSCOPY;  Service: Cardiovascular;  Laterality: N/A;  . CHOLECYSTECTOMY    . DEBRIDMENT OF DECUBITUS ULCER N/A 06/04/2017   Procedure: DEBRIDMENT OF SACRAL DECUBITUS ULCER;  Surgeon: Georganna Skeans, MD;  Location: [place];  Service: General;  Laterality: N/A;  . INCISION AND DRAINAGE ABSCESS N/A 06/14/2017   Procedure: INCISION AND DRAINAGE GLUTEAL ABSCESS;  Surgeon: Kieth Brightly Arta Bruce, MD;  Location: [place];  Service: General;  Laterality: N/A;  Packing to coccyx with packing and drain to right buttock  . INSERTION OF DIALYSIS CATHETER Right 05/04/2017   Procedure: INSERTION OF TUNNELED  DIALYSIS CATHETER;  Surgeon: Conrad Mud Lake, MD;  Location: [place];  Service: Vascular;  Laterality: Right;  . IR FLUORO GUIDE CV LINE RIGHT  04/27/2017  . IR US GUIDE VASC ACCESS RIGHT  04/27/2017  . PERIPHERAL VASCULAR BALLOON ANGIOPLASTY  10/29/2017   Procedure: PERIPHERAL VASCULAR BALLOON ANGIOPLASTY;  Surgeon: Elam Dutch, MD;  Location: [place] CV LAB;  Service: Cardiovascular;;  Lt arm Fistula   . TEE WITHOUT CARDIOVERSION N/A 08/01/2015  Procedure: TRANSESOPHAGEAL ECHOCARDIOGRAM (TEE);  Surgeon: Josue Hector, MD;  Location: [facility] ENDOSCOPY;  Service: Cardiovascular;  Laterality: N/A;     Social History Social History   Tobacco Use  . Smoking status: Never Smoker  . Smokeless tobacco: Never Used  Substance Use Topics  . Alcohol use: No  . Drug use: No    Family History Family History  Problem Relation Age of Onset  . Heart disease Mother   . Hyperlipidemia Father   . Hypertension Father     Allergies  No Known Allergies   Current  Outpatient Medications  Medication Sig Dispense Refill  . amiodarone (PACERONE) 200 MG tablet Take 1 tablet (200 mg total) by mouth daily. 30 tablet 0  . apixaban (ELIQUIS) 5 MG TABS tablet Take 1 tablet (5 mg total) by mouth 2 (two) times daily. 60 tablet 0  . B Complex-C-Folic Acid (DIALYVITE PO) Take 1 tablet by mouth daily with supper.    . calcitRIOL (ROCALTROL) 0.5 MCG capsule Take 1 capsule (0.5 mcg total) by mouth every Tuesday, Thursday, and Saturday at 6 PM. 90 capsule 0  . carvedilol (COREG) 6.25 MG tablet Take 1 tablet (6.25 mg total) by mouth 2 (two) times daily with a meal. 60 tablet 0  . collagenase (SANTYL) ointment Apply topically daily.    . diclofenac sodium (VOLTAREN) 1 % GEL Apply 4 g topically 2 (two) times daily as needed (apply over affected area). (Patient taking differently: Apply 4 g topically 2 (two) times daily as needed (for pain). )    . docusate sodium (COLACE) 100 MG capsule Take 100 mg by mouth daily as needed for mild constipation.    . feeding supplement, GLUCERNA SHAKE, (GLUCERNA SHAKE) LIQD Take 237 mLs by mouth 3 (three) times daily between meals.  0  . gabapentin (NEURONTIN) 300 MG capsule Take 300 mg by mouth daily.    . insulin glargine (LANTUS) 100 UNIT/ML injection Inject 0.15 mLs (15 Units total) into the skin at bedtime.    [name] (FOSRENOL) 500 MG chewable tablet Chew 0.5 tablets (250 mg total) by mouth 3 (three) times daily with meals. (Patient taking differently: Chew 500 mg by mouth 3 (three) times daily with meals. )    . NOVOLOG FLEXPEN 100 UNIT/ML FlexPen Inject 2-12 Units as directed 3 (three) times daily with meals. Per sliding scale  0  . predniSONE (DELTASONE) 10 MG tablet Take 1 tablet (10 mg total) by mouth daily with breakfast. Last dose 4/25 (Patient taking differently: Take 7.5 mg by mouth daily with breakfast. )     No current facility-administered medications for this visit.     ROS:   General:  No weight loss, Fever,  chills  HEENT: No recent headaches, no nasal bleeding, no visual changes, no sore throat  Neurologic: No dizziness, blackouts, seizures. No recent symptoms of stroke or mini- stroke. No recent episodes of slurred speech, or temporary blindness.  Cardiac: No recent episodes of chest pain/pressure, no shortness of breath at rest.  No shortness of breath with exertion.  Denies history of atrial fibrillation or irregular heartbeat  Vascular: No history of rest pain in feet.  No history of claudication.  No history of non-healing ulcer, No history of DVT   Pulmonary: No home oxygen, no productive cough, no hemoptysis,  No asthma or wheezing  Musculoskeletal:  [x ] Arthritis, [ ]  Low back pain,  [ ]  Joint pain  Hematologic:No history of hypercoagulable state.  No history of easy  bleeding.  No history of anemia  Gastrointestinal: No hematochezia or melena,  No gastroesophageal reflux, no trouble swallowing  Urinary: [ ]  chronic Kidney disease, [x ] on HD - [ ]  MWF or [x ] TTHS, [ ]  Burning with urination, [ ]  Frequent urination, [ ]  Difficulty urinating;   Skin: No rashes  Psychological: No history of anxiety,  No history of depression   Physical Examination  Vitals:   01/19/18 1358  BP: (!) 153/98  Pulse: 96  Resp: 18  Temp: 98.5 F (36.9 C)  TempSrc: Oral  SpO2: 97%  Weight: 287 lb (130.2 kg)  Height: 5\' 11"  (1.803 m)    Body mass index is 40.03 kg/m.  General:  Alert and oriented, no acute distress HEENT: Normal, normocephalic Neck: No bruit or JVD Pulmonary: Clear to auscultation bilaterally Cardiac: Regular Rate and Rhythm without murmur Gastrointestinal: Soft, non-tender, non-distended, no mass, no scars Skin: No rash Extremity Pulses:  Palpable throughout the fistula.   Musculoskeletal: No deformity or edema  Neurologic: Upper and lower extremity motor 5/5 and symmetric  DATA:  AV fistula duplex Patent flow in the fistula with retained valve in the proximal  forearm outflow vein and multiple branches.   Diameter prox. 0.76 Mid forarm 0.51 with depth 0.66 Distal  0.30 with depth of 0.38 Multiple competing branches   ASSESSMENT:  ESRD on HD with TDC    PLAN: I advised them to try and stick the fistula tomorrow.  If they are still having difficulty he will need fistulogram and or branch ligation to the left forearm fistula.  It may not be salvable.  On his previous scan 10/29/2017 the fistula was maturing well with diameters 0.52-0.64-0.71 cm.  Post fistulogram on 10/29/2017 with Operative findings: #1 2.5 cm in length 70% narrowing midportion of fistula angioplastied to 7 mm 0 residual stenosis #2 1 cm length 50% narrowing just below the antecubital crease angioplastied to 7 mm 0 residual stenosis.  It has not matured further.    He will call us tomorrow after HD and let us know if the fistula works or not.      Roxy Horseman PA-C [facility] [place] Office: [phone]

## 2018-01-21 ENCOUNTER — Ambulatory Visit: Payer: BLUE CROSS/BLUE SHIELD | Admitting: Podiatry

## 2018-03-30 ENCOUNTER — Encounter: Payer: Self-pay | Admitting: Podiatry

## 2018-03-30 ENCOUNTER — Ambulatory Visit: Payer: BLUE CROSS/BLUE SHIELD | Admitting: Podiatry

## 2018-03-30 VITALS — BP 137/87

## 2018-03-30 DIAGNOSIS — M79675 Pain in left toe(s): Secondary | ICD-10-CM

## 2018-03-30 DIAGNOSIS — E1142 Type 2 diabetes mellitus with diabetic polyneuropathy: Secondary | ICD-10-CM

## 2018-03-30 DIAGNOSIS — M79674 Pain in right toe(s): Secondary | ICD-10-CM

## 2018-03-30 DIAGNOSIS — B351 Tinea unguium: Secondary | ICD-10-CM

## 2018-03-30 MED ORDER — NONFORMULARY OR COMPOUNDED ITEM: 5 refills | Status: AC

## 2018-03-30 NOTE — Patient Instructions (Signed)
Diabetes Mellitus and Foot Care  Foot care is an important part of your health, especially when you have diabetes. Diabetes may cause you to have problems because of poor blood flow (circulation) to your feet and legs, which can cause your skin to:   Become thinner and drier.   Break more easily.   Heal more slowly.   Peel and crack.  You may also have nerve damage (neuropathy) in your legs and feet, causing decreased feeling in them. This means that you may not notice minor injuries to your feet that could lead to more serious problems. Noticing and addressing any potential problems early is the best way to prevent future foot problems.  How to care for your feet  Foot hygiene   Wash your feet daily with warm water and mild soap. Do not use hot water. Then, pat your feet and the areas between your toes until they are completely dry. Do not soak your feet as this can dry your skin.   Trim your toenails straight across. Do not dig under them or around the cuticle. File the edges of your nails with an emery board or nail file.   Apply a moisturizing lotion or petroleum jelly to the skin on your feet and to dry, brittle toenails. Use lotion that does not contain alcohol and is unscented. Do not apply lotion between your toes.  Shoes and socks   Wear clean socks or stockings every day. Make sure they are not too tight. Do not wear knee-high stockings since they may decrease blood flow to your legs.   Wear shoes that fit properly and have enough cushioning. Always look in your shoes before you put them on to be sure there are no objects inside.   To break in new shoes, wear them for just a few hours a day. This prevents injuries on your feet.  Wounds, scrapes, corns, and calluses   Check your feet daily for blisters, cuts, bruises, sores, and redness. If you cannot see the bottom of your feet, use a mirror or ask someone for help.   Do not cut corns or calluses or try to remove them with medicine.   If you  find a minor scrape, cut, or break in the skin on your feet, keep it and the skin around it clean and dry. You may clean these areas with mild soap and water. Do not clean the area with peroxide, alcohol, or iodine.   If you have a wound, scrape, corn, or callus on your foot, look at it several times a day to make sure it is healing and not infected. Check for:  ? Redness, swelling, or pain.  ? Fluid or blood.  ? Warmth.  ? Pus or a bad smell.  General instructions   Do not cross your legs. This may decrease blood flow to your feet.   Do not use heating pads or hot water bottles on your feet. They may burn your skin. If you have lost feeling in your feet or legs, you may not know this is happening until it is too late.   Protect your feet from hot and cold by wearing shoes, such as at the beach or on hot pavement.   Schedule a complete foot exam at least once a year (annually) or more often if you have foot problems. If you have foot problems, report any cuts, sores, or bruises to your health care provider immediately.  Contact a health care provider if:     You have a medical condition that increases your risk of infection and you have any cuts, sores, or bruises on your feet.   You have an injury that is not healing.   You have redness on your legs or feet.   You feel burning or tingling in your legs or feet.   You have pain or cramps in your legs and feet.   Your legs or feet are numb.   Your feet always feel cold.   You have pain around a toenail.  Get help right away if:   You have a wound, scrape, corn, or callus on your foot and:  ? You have pain, swelling, or redness that gets worse.  ? You have fluid or blood coming from the wound, scrape, corn, or callus.  ? Your wound, scrape, corn, or callus feels warm to the touch.  ? You have pus or a bad smell coming from the wound, scrape, corn, or callus.  ? You have a fever.  ? You have a red line going up your leg.  Summary   Check your feet every day  for cuts, sores, red spots, swelling, and blisters.   Moisturize feet and legs daily.   Wear shoes that fit properly and have enough cushioning.   If you have foot problems, report any cuts, sores, or bruises to your health care provider immediately.   Schedule a complete foot exam at least once a year (annually) or more often if you have foot problems.  This information is not intended to replace advice given to you by your health care provider. Make sure you discuss any questions you have with your health care provider.  Document Released: 02/28/2000 Document Revised: 04/14/2017 Document Reviewed: 04/03/2016  [employer] Interactive Patient Education  2019 [employer]

## 2018-04-15 ENCOUNTER — Other Ambulatory Visit: Payer: Self-pay

## 2018-04-15 DIAGNOSIS — N186 End stage renal disease: Secondary | ICD-10-CM

## 2018-04-15 DIAGNOSIS — Z992 Dependence on renal dialysis: Principal | ICD-10-CM

## 2018-04-17 ENCOUNTER — Encounter: Payer: Self-pay | Admitting: Podiatry

## 2018-04-17 NOTE — Progress Notes (Addendum)
Subjective: John Bailey presents today referred by [name], MD with diabetes and cc of painful, discolored, thick toenails which interfere with daily activities.  Pain is aggravated when wearing enclosed shoe gear. Pain has seen a Podiatrist in the past, but that Podiatrist is now out of network with his insurance per patient. He relates neuropathy symptoms for about 5 years for which he now takes gabapentin. He is also on blood thinner Eliquis. On dialysis on TTS schedule. He is to see his PCP, Dr. [name] on today as well.  Past Medical History:  Diagnosis Date  . Atrial flutter with rapid ventricular response ([place]) 08/06/2015   a. s/p DCCV in 07/2015 with recurrent atrial fibrillation and DCCV in 12/2016. Initially successful but noted to be back in atrial fibrillation within 2 weeks of DCCV.   [name] Chronic diastolic (congestive) heart failure (HCC)    a. EF 50-55% by echo in 06/2016.  . Diabetes ([place])   . Dysrhythmia    Aflutter  . ESRD (end stage renal disease) on dialysis ([place])    "TTS; at [facility]" (06/08/2017)  . Hypertension   . Peripheral vascular disease ([place])   . Rheumatoid arthritis [facility])     Patient Active Problem List   Diagnosis Date Noted  . Anemia due to chronic kidney disease 06/30/2017  . Sacral osteomyelitis ([employer])   . Sacral wound 06/08/2017  . Pressure injury of sacral region, unstageable ([place])   . Noncompliance   . Constipation   . Steroid-induced hyperglycemia   . HSV (herpes simplex virus) infection   . Neuropathic pain   . Labile blood pressure   . Labile blood glucose   . Carpal tunnel syndrome of left wrist   . Pain   . Leukocytosis   . PAF (paroxysmal atrial fibrillation) ([place])   . Acute on chronic diastolic (congestive) heart failure ([place])   . Type 2 diabetes mellitus with peripheral neuropathy (HCC)   . Acute blood loss anemia   . Anemia of chronic disease   . ESRD on dialysis ([place])   . Cardiorenal syndrome with renal failure   .  Cardiorenal syndrome, stage 1-4 or unspecified chronic kidney disease, with heart failure ([place]) 05/06/2017  . Atrial fibrillation, chronic 04/22/2017  . Hypoxia 04/22/2017  . Excessive daytime sleepiness 04/02/2017  . Debility 04/02/2017  . Bradycardia 03/28/2017  . CKD (chronic kidney disease) stage 4, GFR 15-29 ml/min (HCC) 03/28/2017  . Pseudogout 03/09/2017  . Rheumatoid arthritis ([place]) 03/09/2017  . Infected ulcer of skin, with fat layer exposed ([place]) 03/09/2017  . Normocytic anemia 03/06/2017  . Elevated troponin 03/06/2017  . Acute renal failure superimposed on stage 4 chronic kidney disease ([place]) 03/06/2017  . Essential hypertension 12/29/2016  . Persistent atrial fibrillation   . Left leg cellulitis   . Atrial fibrillation with RVR ([place]) 12/02/2016  . Sepsis ([place]) 12/02/2016  . Acute on chronic congestive heart failure ([place])   . Acute on chronic renal insufficiency   . Chronic diastolic CHF (congestive heart failure) ([place]) 03/20/2016  . Nonischemic cardiomyopathy ([place]) 11/11/2015  . Long term (current) use of anticoagulants [Z79.01] 08/14/2015  . Insulin-dependent diabetes mellitus with renal complications ([place]) 37/34/2876  . Atrial flutter with rapid ventricular response ([place]) 08/06/2015  . Hyponatremia 07/30/2015  . Acute kidney injury ([place]) 07/30/2015  . Hyperkalemia 07/30/2015  . Acute on chronic diastolic CHF (congestive heart failure) ([place]) 07/30/2015  . Morbid obesity ([place]) 07/30/2015  . Paroxysmal atrial fibrillation ([place]) 07/30/2015  . LVH (left  ventricular hypertrophy) due to hypertensive disease 05/30/2014  . Hypertensive heart disease with heart failure ([place]) 07/14/2012  . Dyspnea 07/14/2012  . Hyperlipidemia 07/14/2012    Past Surgical History:  Procedure Laterality Date  . A/V FISTULAGRAM Left 10/29/2017   Procedure: A/V FISTULAGRAM;  Surgeon: Elam Dutch, MD;  Location: [place] [facility];  Service: Cardiovascular;  Laterality: Left;  . AV FISTULA  PLACEMENT Left 05/10/2017   Procedure: CREATION of Left Radicephalic Fistula;  Surgeon: Rosetta Posner, MD;  Location: [place];  Service: Vascular;  Laterality: Left;  . CARDIOVERSION N/A 08/01/2015   Procedure: CARDIOVERSION;  Surgeon: Josue Hector, MD;  Location: [facility] ENDOSCOPY;  Service: Cardiovascular;  Laterality: N/A;  . CARDIOVERSION N/A 12/18/2016   Procedure: CARDIOVERSION;  Surgeon: [name], MD;  Location[facility] ENDOSCOPY;  Service: Cardiovascular;  Laterality: N/A;  . CARDIOVERSION N/A 04/30/2017   Procedure: CARDIOVERSION;  Surgeon: Larey Dresser, MD;  Location: [facility] ENDOSCOPY;  Service: Cardiovascular;  Laterality: N/A;  . CHOLECYSTECTOMY    . DEBRIDMENT OF DECUBITUS ULCER N/A 06/04/2017   Procedure: DEBRIDMENT OF SACRAL DECUBITUS ULCER;  Surgeon: Georganna Skeans, MD;  Location: [place];  Service: General;  Laterality: N/A;  . INCISION AND DRAINAGE ABSCESS N/A 06/14/2017   Procedure: INCISION AND DRAINAGE GLUTEAL ABSCESS;  Surgeon: Kieth Brightly Arta Bruce, MD;  Location: [place];  Service: General;  Laterality: N/A;  Packing to coccyx with packing and drain to right buttock  . INSERTION OF DIALYSIS CATHETER Right 05/04/2017   Procedure: INSERTION OF TUNNELED  DIALYSIS CATHETER;  Surgeon: Conrad White Rock, MD;  Location: [place];  Service: Vascular;  Laterality: Right;  . IR FLUORO GUIDE CV LINE RIGHT  04/27/2017  . IR US GUIDE VASC ACCESS RIGHT  04/27/2017  . PERIPHERAL VASCULAR BALLOON ANGIOPLASTY  10/29/2017   Procedure: PERIPHERAL VASCULAR BALLOON ANGIOPLASTY;  Surgeon: Elam Dutch, MD;  Location: [place] [facility];  Service: Cardiovascular;;  Lt arm Fistula   . TEE WITHOUT CARDIOVERSION N/A 08/01/2015   Procedure: TRANSESOPHAGEAL ECHOCARDIOGRAM (TEE);  Surgeon: Josue Hector, MD;  Location: [facility] ENDOSCOPY;  Service: Cardiovascular;  Laterality: N/A;     Current Outpatient Medications:  .  acetaminophen-codeine (TYLENOL #3) 300-30 MG tablet, Take by mouth., Disp: , Rfl:  .  albuterol  (PROVENTIL HFA;VENTOLIN HFA) 108 (90 Base) MCG/ACT inhaler, Inhale into the lungs., Disp: , Rfl:  .  amiodarone (PACERONE) 200 MG tablet, Take 1 tablet (200 mg total) by mouth daily., Disp: 30 tablet, Rfl: 0 .  apixaban (ELIQUIS) 5 MG TABS tablet, Take 1 tablet (5 mg total) by mouth 2 (two) times daily., Disp: 60 tablet, Rfl: 0 .  azithromycin (ZITHROMAX) 250 MG tablet, TAKE 2 TABLETS BY MOUTH ON DAY 1, THEN TAKE 1 TABLET DAILY ON DAYS 2-5, Disp: , Rfl:  .  B Complex-C-Folic Acid (DIALYVITE PO), Take 1 tablet by mouth daily with supper., Disp: , Rfl:  .  calcitRIOL (ROCALTROL) 0.5 MCG capsule, Take 1 capsule (0.5 mcg total) by mouth every Tuesday, Thursday, and Saturday at 6 PM., Disp: 90 capsule, Rfl: 0 .  carvedilol (COREG) 6.25 MG tablet, Take 1 tablet (6.25 mg total) by mouth 2 (two) times daily with a meal., Disp: 60 tablet, Rfl: 0 .  celecoxib (CELEBREX) 100 MG capsule, Take 100 mg by mouth 2 (two) times daily., Disp: , Rfl:  .  clindamycin (CLEOCIN) 150 MG capsule, TAKE 1 CAPSULE 4 TIMES DAILY UNTIL COMPLETE., Disp: , Rfl: 0 .  collagenase (SANTYL) ointment,  Apply topically daily., Disp: , Rfl:  .  Continuous Blood Gluc Sensor (FREESTYLE LIBRE 14 DAY SENSOR) MISC, USE TO TEST BLOOD SUGAR. CHANGE EVERY 14 DAYS TO A NEW SENSOR ON NEW PLACE, Disp: , Rfl: 0 .  diclofenac sodium (VOLTAREN) 1 % GEL, Apply 4 g topically 2 (two) times daily as needed (apply over affected area). (Patient taking differently: Apply 4 g topically 2 (two) times daily as needed (for pain). ), Disp: , Rfl:  .  diltiazem (TIAZAC) 180 MG 24 hr capsule, Take 180 mg by mouth daily., Disp: , Rfl:  .  docusate sodium (COLACE) 100 MG capsule, Take 100 mg by mouth daily as needed for mild constipation., Disp: , Rfl:  .  feeding supplement, GLUCERNA SHAKE, (GLUCERNA SHAKE) LIQD, Take 237 mLs by mouth 3 (three) times daily between meals., Disp: , Rfl: 0 .  furosemide (LASIX) 40 MG tablet, Take 40 mg by mouth 2 (two) times daily., Disp:  , Rfl: 2 .  gabapentin (NEURONTIN) 300 MG capsule, Take 300 mg by mouth daily., Disp: , Rfl:  .  Glucose Blood (FREESTYLE LITE TEST VI), , Disp: , Rfl:  .  HYDROcodone-acetaminophen (NORCO/VICODIN) 5-325 MG tablet, TAKE 1 TABLET BY MOUTH EVERY 4 TO 6 HOURS AS NEEDED FOR PAIN, Disp: , Rfl: 0 .  insulin glargine (LANTUS) 100 UNIT/ML injection, Inject 0.15 mLs (15 Units total) into the skin at bedtime., Disp: , Rfl:  .  Lancets (FREESTYLE) lancets, , Disp: , Rfl:  .  lanthanum (FOSRENOL) 500 MG chewable tablet, Chew 0.5 tablets (250 mg total) by mouth 3 (three) times daily with meals. (Patient taking differently: Chew 500 mg by mouth 3 (three) times daily with meals. ), Disp: , Rfl:  .  lidocaine-prilocaine (EMLA) cream, APPLY SMALL AMOUNT TO ACCESS SITE (AVF) 3 TIMES A WEEK 1 HOUR BEFORE DIALYSIS. COVER WITH OCCLUSIVE DRESSING (SARAN WRAP), Disp: , Rfl:  .  NOVOLOG FLEXPEN 100 UNIT/ML FlexPen, Inject 2-12 Units as directed 3 (three) times daily with meals. Per sliding scale, Disp: , Rfl: 0 .  nystatin cream (MYCOSTATIN), , Disp: , Rfl:  .  potassium chloride SA (K-DUR,KLOR-CON) 20 MEQ tablet, Take 20 mEq by mouth daily., Disp: , Rfl:  .  predniSONE (DELTASONE) 10 MG tablet, Take 1 tablet (10 mg total) by mouth daily with breakfast. Last dose 4/25 (Patient taking differently: Take 7.5 mg by mouth daily with breakfast. ), Disp: , Rfl:  .  SURE COMFORT PEN NEEDLES 31G X 5 MM MISC, USE AS DIRECTED FOR insulin, Disp: , Rfl:  .  NONFORMULARY OR COMPOUNDED ITEM, [facility]:  Peripheral Neuropathy Cream -Bupivacaine 1%, Doxepin 3%, Gabapentin 6%, Pentoxifylline 3%. Topiramate 1%, apply 1-2 grams to affected area 3-4 times a day., Disp: 30 each, Rfl: 5  No Known Allergies  Social History   Occupational History  . Not on file  Tobacco Use  . Smoking status: Never Smoker  . Smokeless tobacco: Never Used  Substance and Sexual Activity  . Alcohol use: No  . Drug use: No  . Sexual activity: Yes     Family History  Problem Relation Age of Onset  . Heart disease Mother   . Hyperlipidemia Father   . Hypertension Father     There is no immunization history for the selected administration types on file for this patient.   Review of systems: Positive Findings in bold print.  Constitutional:  chills, fatigue, fever, sweats, weight change Communication: Optometrist, sign Ecologist, hand writing, iPad/Android device  Head: headaches, head injury Eyes: changes in vision, eye pain, glaucoma, cataracts, macular degeneration, diplopia, glare,  light sensitivity, eyeglasses or contacts, blindness Ears nose mouth throat: Hard of hearing, ringing in ears, deaf, sign language,  vertigo,   nosebleeds,  rhinitis,  cold sores, snoring, swollen glands Cardiovascular: HTN, edema, arrhythmia, pacemaker in place, defibrillator in place,  chest pain/tightness, chronic anticoagulation, blood clot, heart failure Peripheral Vascular: leg cramps, varicose veins, blood clots, lymphedema Respiratory:  difficulty breathing, denies congestion, SOB, wheezing, cough, emphysema Gastrointestinal: change in appetite or weight, abdominal pain, constipation, diarrhea, nausea, vomiting, vomiting blood, change in bowel habits, abdominal pain, jaundice, rectal bleeding, hemorrhoids, Genitourinary:  nocturia,  pain on urination,  blood in urine, Foley catheter, urinary urgency, hemodialysis Musculoskeletal: uses mobility aid,  cramping, stiff joints, painful joints, decreased joint motion, fractures, OA, gout Skin: +changes in toenails, color change, dryness, itching, mole changes,  rash  Neurological: headaches, numbness in feet, paresthesias in feet, burning in feet, fainting,  seizures, change in speech. denies headaches, memory problems/poor historian, cerebral palsy, weakness, paralysis Endocrine: diabetes, hypothyroidism, hyperthyroidism,  goiter, dry mouth, flushing, heat intolerance,  cold intolerance,   excessive thirst, denies polyuria,  nocturia Hematological:  easy bleeding, excessive bleeding, easy bruising, enlarged lymph nodes, on long term blood thinner, history of past transusions Allergy/immunological:  hives, eczema, frequent infections, multiple drug allergies, seasonal allergies, transplant recipient Psychiatric:  anxiety, depression, mood disorder, suicidal ideations, hallucinations   Objective: Vascular Examination: Capillary refill time immediate x 10 digits Dorsalis pedis 2/4 b/l  Posterior tibial pulses 1/4 b/l No digital hair x 10 digits Skin temperature gradient WNL b/l Edema noted b/l feet  Dermatological Examination: Skin with normal turgor, texture and tone b/l  Toenails 1-5 b/l discolored, thick, dystrophic with subungual debris and pain with palpation to nailbeds due to thickness of nails.  Musculoskeletal: Muscle strength 5/5 to all LE muscle groups  No bony prominences  No pain with calf compression b/l  Neurological: Sensation intact with 10 gram monofilament Vibratory sensation diminished b/l  Assessment: 1. Painful onychomycosis toenails 1-5 b/l  2. NIDDM with neuropathy  Plan: 1. Discussed diabetic foot care principles. Literature dispensed on today.Rx written for nonformulary compounding topical antifungal: [facility]: Antifungal cream - Terbinafine 3%, Fluconazole 2%, Tea Tree Oil 5%, Urea 10%, Ibuprofen 2% in DMSO Suspension #4ml. Apply to the affected nail(s) once (at bedtime) or twice daily. 2. Toenails 1-5 b/l were debrided in length and girth without iatrogenic bleeding. 3. Patient to continue soft, supportive shoe gear 4. Patient to report any pedal injuries to medical professional immediately. 5. Follow up 3 months. Patient/POA to call should there be a concern in the interim.

## 2018-05-19 ENCOUNTER — Encounter: Payer: Self-pay | Admitting: Vascular Surgery

## 2018-05-19 ENCOUNTER — Other Ambulatory Visit: Payer: Self-pay

## 2018-05-19 ENCOUNTER — Ambulatory Visit (INDEPENDENT_AMBULATORY_CARE_PROVIDER_SITE_OTHER): Payer: BLUE CROSS/BLUE SHIELD | Admitting: Vascular Surgery

## 2018-05-19 ENCOUNTER — Ambulatory Visit (INDEPENDENT_AMBULATORY_CARE_PROVIDER_SITE_OTHER)
Admission: RE | Admit: 2018-05-19 | Discharge: 2018-05-19 | Disposition: A | Discharge status: Home / Self Care | Payer: BLUE CROSS/BLUE SHIELD | Source: Ambulatory Visit | Attending: Vascular Surgery | Admitting: Vascular Surgery

## 2018-05-19 ENCOUNTER — Ambulatory Visit (HOSPITAL_COMMUNITY)
Admission: RE | Admit: 2018-05-19 | Discharge: 2018-05-19 | Disposition: A | Discharge status: Home / Self Care | Payer: BLUE CROSS/BLUE SHIELD | Source: Ambulatory Visit | Attending: Family | Admitting: Family

## 2018-05-19 ENCOUNTER — Encounter: Payer: Self-pay | Admitting: *Deleted

## 2018-05-19 ENCOUNTER — Other Ambulatory Visit: Payer: Self-pay | Admitting: *Deleted

## 2018-05-19 ENCOUNTER — Telehealth: Payer: Self-pay | Admitting: *Deleted

## 2018-05-19 VITALS — BP 103/75 | HR 86 | Temp 97.6°F | Resp 20 | Ht 71.0 in | Wt 314.3 lb

## 2018-05-19 DIAGNOSIS — Z992 Dependence on renal dialysis: Secondary | ICD-10-CM | POA: Diagnosis present

## 2018-05-19 DIAGNOSIS — N186 End stage renal disease: Secondary | ICD-10-CM | POA: Diagnosis not present

## 2018-05-19 NOTE — Telephone Encounter (Signed)
Patient with diagnosis of Afib on Eliquis for anticoagulation.    Procedure: AV fistula Date of procedure: 06/06/18  CHADS2-VASc score of  3 (CHF, HTN, AGE, DM2, stroke/tia x 2, CAD, AGE, male)  ESRD on dialysis  Surgery asking for 2 day hold, with pt on dialysis and CHADSVASC of 3 would be reasonable to hold longer than 2 days as clearance in dialysis is unknown. Will route to Dr. Debara Pickett for input on length of hold prior to procedure.

## 2018-05-19 NOTE — Telephone Encounter (Signed)
Request for Surgical Clearance  1. What type of surgery is being performed?  CREATION OF ARTERIOVENOUS FISTULA     2. When is this surgery scheduled? 06/06/2018.  3. What type of clearance is required (medical clearance vs. Pharmacy clearance to hold med vs. Both)? PHARMACY ONLY PHARMACY ONLY!!    4. Are there any medications that need to be held prior to surgery and how long?  ELIQUIS HOLD FOR 2 DAYS PRE-OP    5. Practice name and name of physician performing surgery?   [facility]. DR. Juanda Crumble FIELDS    6.  What is your office phone number? [phone]    7. What is your office fax number? (Be sure to include anyone who it needs to go Attn to) [phone] ATTN. [email or web address]    8. Anesthesia type (None, local, MAC, general)? MAC   REMINDER TO USER: Remember to please route this message to P CV DIV PREOP in a phone note.

## 2018-05-19 NOTE — Telephone Encounter (Signed)
Routing to pharmacy to address  anticoagulation

## 2018-05-19 NOTE — Progress Notes (Signed)
Referring Physician: Dr Hollie Salk  Patient name: John Bailey MRN: [id number] DOB: 11/22/1970 Sex: male  REASON FOR CONSULT: hemodialysis access  HPI: John Bailey is a 48 y.o. male, with end-stage renal disease.  His current dialysis today is Tuesday Thursday Saturday.  He had a left radiocephalic AV fistula placed February 2019.  This has been a poor access site for him.  It is essentially failing at this point.  He does have some baseline left hand dysfunction but this was prior to his fistula placement.  He does have a history of atrial fibrillation and is on Eliquis for this.  It is followed by Dr. Debara Pickett.  Other medical problems include hypertension, diabetes rheumatoid arthritis all of which are currently stable.  Past Medical History:  Diagnosis Date  . Atrial flutter with rapid ventricular response ([place]) 08/06/2015   a. s/p DCCV in 07/2015 with recurrent atrial fibrillation and DCCV in 12/2016. Initially successful but noted to be back in atrial fibrillation within 2 weeks of DCCV.   [name] Chronic diastolic (congestive) heart failure (HCC)    a. EF 50-55% by echo in 06/2016.  . Diabetes ([place])   . Dysrhythmia    Aflutter  . ESRD (end stage renal disease) on dialysis ([place])    "TTS; at [facility]" (06/08/2017)  . Hypertension   . Peripheral vascular disease ([place])   . Rheumatoid arthritis [facility])    Past Surgical History:  Procedure Laterality Date  . A/V FISTULAGRAM Left 10/29/2017   Procedure: A/V FISTULAGRAM;  Surgeon: Elam Dutch, MD;  Location: [place] CV LAB;  Service: Cardiovascular;  Laterality: Left;  . AV FISTULA PLACEMENT Left 05/10/2017   Procedure: CREATION of Left Radicephalic Fistula;  Surgeon: Rosetta Posner, MD;  Location: [place];  Service: Vascular;  Laterality: Left;  . CARDIOVERSION N/A 08/01/2015   Procedure: CARDIOVERSION;  Surgeon: Josue Hector, MD;  Location: [facility] ENDOSCOPY;  Service: Cardiovascular;  Laterality: N/A;  . CARDIOVERSION N/A 12/18/2016   Procedure: CARDIOVERSION;  Surgeon: [name], MD;  Location: [facility] ENDOSCOPY;  Service: Cardiovascular;  Laterality: N/A;  . CARDIOVERSION N/A 04/30/2017   Procedure: CARDIOVERSION;  Surgeon: Larey Dresser, MD;  Location: [facility] ENDOSCOPY;  Service: Cardiovascular;  Laterality: N/A;  . CHOLECYSTECTOMY    . DEBRIDMENT OF DECUBITUS ULCER N/A 06/04/2017   Procedure: DEBRIDMENT OF SACRAL DECUBITUS ULCER;  Surgeon: Georganna Skeans, MD;  Location: [place];  Service: General;  Laterality: N/A;  . INCISION AND DRAINAGE ABSCESS N/A 06/14/2017   Procedure: INCISION AND DRAINAGE GLUTEAL ABSCESS;  Surgeon: Kieth Brightly Arta Bruce, MD;  Location: [place];  Service: General;  Laterality: N/A;  Packing to coccyx with packing and drain to right buttock  . INSERTION OF DIALYSIS CATHETER Right 05/04/2017   Procedure: INSERTION OF TUNNELED  DIALYSIS CATHETER;  Surgeon: Conrad [place], MD;  Location: [place];  Service: Vascular;  Laterality: Right;  . IR FLUORO GUIDE CV LINE RIGHT  04/27/2017  . IR US GUIDE VASC ACCESS RIGHT  04/27/2017  . PERIPHERAL VASCULAR BALLOON ANGIOPLASTY  10/29/2017   Procedure: PERIPHERAL VASCULAR BALLOON ANGIOPLASTY;  Surgeon: Elam Dutch, MD;  Location: [place] CV LAB;  Service: Cardiovascular;;  Lt arm Fistula   . TEE WITHOUT CARDIOVERSION N/A 08/01/2015   Procedure: TRANSESOPHAGEAL ECHOCARDIOGRAM (TEE);  Surgeon: Josue Hector, MD;  Location: [facility] ENDOSCOPY;  Service: Cardiovascular;  Laterality: N/A;    Family History  Problem Relation Age of Onset  . Heart disease Mother   .  Hyperlipidemia Father   . Hypertension Father     SOCIAL HISTORY: Social History   Socioeconomic History  . Marital status: Married    Spouse name: Not on file  . Number of children: Not on file  . Years of education: Not on file  . Highest education level: Not on file  Occupational History  . Not on file  Social Needs  . Financial resource strain: Not on file  . Food insecurity:    Worry: Not on  file    Inability: Not on file  . Transportation needs:    Medical: Not on file    Non-medical: Not on file  Tobacco Use  . Smoking status: Never Smoker  . Smokeless tobacco: Never Used  Substance and Sexual Activity  . Alcohol use: No  . Drug use: No  . Sexual activity: Yes  Lifestyle  . Physical activity:    Days per week: Not on file    Minutes per session: Not on file  . Stress: Not on file  Relationships  . Social connections:    Talks on phone: Not on file    Gets together: Not on file    Attends religious service: Not on file    Active member of club or organization: Not on file    Attends meetings of clubs or organizations: Not on file    Relationship status: Not on file  . Intimate partner violence:    Fear of current or ex partner: Not on file    Emotionally abused: Not on file    Physically abused: Not on file    Forced sexual activity: Not on file  Other Topics Concern  . Not on file  Social History Narrative  . Not on file    No Known Allergies  Current Outpatient Medications  Medication Sig Dispense Refill  . acetaminophen-codeine (TYLENOL #3) 300-30 MG tablet Take by mouth.    [name] albuterol (PROVENTIL HFA;VENTOLIN HFA) 108 (90 Base) MCG/ACT inhaler Inhale into the lungs.    [name] amiodarone (PACERONE) 200 MG tablet Take 1 tablet (200 mg total) by mouth daily. 30 tablet 0  . apixaban (ELIQUIS) 5 MG TABS tablet Take 1 tablet (5 mg total) by mouth 2 (two) times daily. 60 tablet 0  . azithromycin (ZITHROMAX) 250 MG tablet TAKE 2 TABLETS BY MOUTH ON DAY 1, THEN TAKE 1 TABLET DAILY ON DAYS 2-5    . B Complex-C-Folic Acid (DIALYVITE PO) Take 1 tablet by mouth daily with supper.    . calcitRIOL (ROCALTROL) 0.5 MCG capsule Take 1 capsule (0.5 mcg total) by mouth every Tuesday, Thursday, and Saturday at 6 PM. 90 capsule 0  . carvedilol (COREG) 6.25 MG tablet Take 1 tablet (6.25 mg total) by mouth 2 (two) times daily with a meal. 60 tablet 0  . collagenase (SANTYL)  ointment Apply topically daily.    . Continuous Blood Gluc Sensor (FREESTYLE LIBRE 14 DAY SENSOR) MISC USE TO TEST BLOOD SUGAR. CHANGE EVERY 14 DAYS TO A NEW SENSOR ON NEW PLACE  0  . diclofenac sodium (VOLTAREN) 1 % GEL Apply 4 g topically 2 (two) times daily as needed (apply over affected area). (Patient taking differently: Apply 4 g topically 2 (two) times daily as needed (for pain). )    . diltiazem (TIAZAC) 180 MG 24 hr capsule Take 180 mg by mouth daily.    [name] docusate sodium (COLACE) 100 MG capsule Take 100 mg by mouth daily as needed for mild constipation.    [name]  feeding supplement, GLUCERNA SHAKE, (GLUCERNA SHAKE) LIQD Take 237 mLs by mouth 3 (three) times daily between meals.  0  . gabapentin (NEURONTIN) 300 MG capsule Take 300 mg by mouth daily.    . Glucose Blood (FREESTYLE LITE TEST VI)     . insulin glargine (LANTUS) 100 UNIT/ML injection Inject 0.15 mLs (15 Units total) into the skin at bedtime.    . Lancets (FREESTYLE) lancets     . lidocaine-prilocaine (EMLA) cream APPLY SMALL AMOUNT TO ACCESS SITE (AVF) 3 TIMES A WEEK 1 HOUR BEFORE DIALYSIS. COVER WITH OCCLUSIVE DRESSING (SARAN WRAP)    . NONFORMULARY OR COMPOUNDED ITEM [facility]:  Peripheral Neuropathy Cream -Bupivacaine 1%, Doxepin 3%, Gabapentin 6%, Pentoxifylline 3%. Topiramate 1%, apply 1-2 grams to affected area 3-4 times a day. 30 each 5  . NOVOLOG FLEXPEN 100 UNIT/ML FlexPen Inject 2-12 Units as directed 3 (three) times daily with meals. Per sliding scale  0  . nystatin cream (MYCOSTATIN)     . predniSONE (DELTASONE) 10 MG tablet Take 1 tablet (10 mg total) by mouth daily with breakfast. Last dose 4/25 (Patient taking differently: Take 7.5 mg by mouth daily with breakfast. )    . SURE COMFORT PEN NEEDLES 31G X 5 MM MISC USE AS DIRECTED FOR insulin     No current facility-administered medications for this visit.     ROS:   General:  No weight loss, Fever, chills  HEENT: No recent headaches, no nasal bleeding,  no visual changes, no sore throat  Neurologic: No dizziness, blackouts, seizures. No recent symptoms of stroke or mini- stroke. No recent episodes of slurred speech, or temporary blindness.  Cardiac: No recent episodes of chest pain/pressure, no shortness of breath at rest.  No shortness of breath with exertion.  Denies history of atrial fibrillation or irregular heartbeat  Vascular: No history of rest pain in feet.  No history of claudication.  No history of non-healing ulcer, No history of DVT   Pulmonary: No home oxygen, no productive cough, no hemoptysis,  No asthma or wheezing  Musculoskeletal:  [X]  Arthritis, [ ]  Low back pain,  [X]  Joint pain  Hematologic:No history of hypercoagulable state.  No history of easy bleeding.  No history of anemia  Gastrointestinal: No hematochezia or melena,  No gastroesophageal reflux, no trouble swallowing  Urinary: [X]  chronic Kidney disease, [X]  on HD - [ ]  MWF or [X]  TTHS, [ ]  Burning with urination, [ ]  Frequent urination, [ ]  Difficulty urinating;   Skin: No rashes  Psychological: No history of anxiety,  No history of depression   Physical Examination  Vitals:   05/19/18 1109  BP: 103/75  Pulse: 86  Resp: 20  Temp: 97.6 F (36.4 C)  SpO2: 93%  Weight: (!) 314 lb 4.3 oz (142.5 kg)  Height: 5\' 11"  (1.803 m)    Body mass index is 43.83 kg/m.  General:  Alert and oriented, no acute distress HEENT: Normal Neck: No bruit or JVD, right side tunneled dialysis catheter Pulmonary: Clear to auscultation bilaterally Cardiac: Regular Rate and Rhythm Skin: No rash Extremity Pulses:  2+ radial, brachial pulses bilaterally, no palpable flow left radiocephalic AV fistula.  Upper arms are obese bilaterally. Musculoskeletal: No deformity or edema  Neurologic: Upper and lower extremity motor 5/5 and symmetric  DATA:  Had an upper extremity arterial duplex exam today which shows normal anatomic configuration at the brachial bifurcation at the  antecubital region.  Arteries 5 mm diameter.  No stenosis.  Vein mapping  showed right upper arm cephalic 3 to 4 mm basilic 5 mm left cephalic upper arm 4 to 5 mm basilic 5 mm  ASSESSMENT: Patient needs new hemodialysis access.   PLAN: Left brachiocephalic AV fistula scheduled for June 06, 2018.  Risk benefits possible complications and procedure details were explained to the patient today including but not limited to bleeding infection ischemic steal fistula thrombosis non-maturation.  He understands and agrees to proceed.   Ruta Hinds, MD [facility] Office: [phone] Pager: [phone]

## 2018-05-20 NOTE — Telephone Encounter (Signed)
Ok to hold Eliquis 2 days prior to procedure as per surgery's request. Thanks.  Dr. Lemmie Evens

## 2018-06-01 ENCOUNTER — Telehealth: Payer: Self-pay | Admitting: *Deleted

## 2018-06-01 NOTE — Telephone Encounter (Signed)
No answer no voice mail on patient's phone. Spoke with BRITTANY at [facility]. To inform patient. Surgery deferred due to COVID-19 restrictions. Patient to continue on Eliquis. Has [facility] for hemodialysis. Will call to reschedule when restrictions lifted. [facility] to call this office if needed/ worsening condition.

## 2018-06-06 ENCOUNTER — Ambulatory Visit (HOSPITAL_COMMUNITY)
Admission: RE | Admit: 2018-06-06 | Payer: BLUE CROSS/BLUE SHIELD | Source: Home / Self Care | Admitting: Vascular Surgery

## 2018-06-06 ENCOUNTER — Encounter (HOSPITAL_COMMUNITY): Admission: RE | Payer: Self-pay | Source: Home / Self Care

## 2018-06-06 SURGERY — ARTERIOVENOUS (AV) FISTULA CREATION
Anesthesia: Monitor Anesthesia Care | Laterality: Left

## 2018-06-16 ENCOUNTER — Other Ambulatory Visit: Payer: Self-pay | Admitting: *Deleted

## 2018-06-16 NOTE — Progress Notes (Signed)
Call to patient instructed to be at [facility] admitting at 10:30 am on 06/27/2018 for surgery. NPO past MN night prior and must have a driver and caregiver for discharge. HOLD ELIQUIS FOR 2 DAYS PRIOR TO SURGERY. Instructed to hold past 4/10 dose. Expect a call and follow the detailed surgery  Instructions/insulin adjustment/medications received from the hospital pre-admission department. Visitor restrictions discussed. Verbalized understanding of instructions.

## 2018-06-24 ENCOUNTER — Encounter (HOSPITAL_COMMUNITY): Payer: Self-pay | Admitting: *Deleted

## 2018-06-24 ENCOUNTER — Other Ambulatory Visit: Payer: Self-pay

## 2018-06-24 MED ORDER — DEXTROSE 5 % IV SOLN
3.0000 g | INTRAVENOUS | Status: AC
  Filled 2018-06-24: qty 3000

## 2018-06-24 NOTE — Progress Notes (Signed)
Spoke with pt for pre-op call. Pt has hx of A-flutter and is on Eliquis. Instructed to stop Eliquis 2 days prior to surgery. Pt states his last dose will be this evening. Pt's cardiologist is Dr. Debara Pickett. Pt is a type 2 Diabetic. Pt doesn't know what his A1C was. He states he gets it checked at Dialysis. Called the dialysis center on [address] and the nurse said it was 8.5 on 04/07/18. Pt states his fasting blood sugar is usually between 80-130. Pt instructed to take 1/2 of his regular dose of Lantus Insulin Sunday PM. He will take 7 units. Pt instructed to check his blood sugar when he gets up Monday AM and every 2 hours until he leaves for the hospital. If blood sugar is >220 take 1/2 of usual correction dose of Novolog insulin. If blood sugar is 70 or below, treat with 1/2 cup of clear juice (apple or cranberry) and recheck blood sugar 15 minutes after drinking juice. If blood sugar continues to be 70 or below, call the Short Stay department and ask to speak to a nurse. Pt voiced understanding.   Coronavirus Screening  Have you experienced the following symptoms:  Cough no Fever (>100.101F)  no Runny nose no Sore throat no Difficulty breathing/shortness of breath  no  Have you or a family member traveled in the last 14 days and where? no

## 2018-06-27 ENCOUNTER — Ambulatory Visit (HOSPITAL_COMMUNITY)
Admission: RE | Admit: 2018-06-27 | Payer: BLUE CROSS/BLUE SHIELD | Source: Home / Self Care | Admitting: Vascular Surgery

## 2018-06-27 HISTORY — DX: Pneumonia, unspecified organism: J18.9

## 2018-06-27 SURGERY — ARTERIOVENOUS (AV) FISTULA CREATION
Anesthesia: Monitor Anesthesia Care | Laterality: Left

## 2018-06-28 ENCOUNTER — Encounter: Payer: Self-pay | Admitting: *Deleted

## 2018-06-28 ENCOUNTER — Other Ambulatory Visit: Payer: Self-pay | Admitting: *Deleted

## 2018-06-28 NOTE — Progress Notes (Signed)
Call from patient to re-scheduled surgery for A/V fistula with Dr. Oneida Alar. Scheduled for 07/08/2018  Patient stated he " missed his appointment". Reviewed all pre-op instructions per pre-op letter including Eliquis hold x 2 days pre-op. Letter of instructions faxed to his [facility].Visitor restrictions given . Verbalized understanding.

## 2018-06-29 ENCOUNTER — Ambulatory Visit: Payer: BLUE CROSS/BLUE SHIELD | Admitting: Podiatry

## 2018-07-04 ENCOUNTER — Encounter: Payer: Self-pay | Admitting: *Deleted

## 2018-07-04 ENCOUNTER — Telehealth: Payer: Self-pay | Admitting: *Deleted

## 2018-07-04 NOTE — Telephone Encounter (Signed)
Cardiac Questionnaire:    Since your last visit or hospitalization:    1. Have you been having new or worsening chest pain? NO   2. Have you been having new or worsening shortness of breath? NO 3. Have you been having new or worsening leg swelling, wt gain, or increase in abdominal girth (pants fitting more tightly)? YES   4. Have you had any passing out spells? NO    *A YES to any of these questions would result in the appointment being kept. *If all the answers to these questions are NO, we should indicate that given the current situation regarding the worldwide coronarvirus pandemic, at the recommendation of the CDC, we are looking to limit gatherings in our waiting area, and thus will reschedule their appointment beyond four weeks from today.   _____________   COVID-19 Pre-Screening Questions:   Do you currently have a fever? NO  Have you recently travelled on a cruise, internationally, or to [place], [place], [place], [place], [place], or [place], [place] [employer])? NO  Have you been in contact with someone that is currently pending confirmation of Covid19 testing or has been confirmed to have the [place] virus?  NO  Are you currently experiencing fatigue or cough? NO          Virtual Visit Pre-Appointment Phone Call  Steps For Call:  1. Confirm consent - "In the setting of the current Covid19 crisis, you are scheduled for a (phone or video) visit with your provider on (date) at (time).  Just as we do with many in-office visits, in order for you to participate in this visit, we must obtain consent.  If you'd like, I can send this to your mychart (if signed up) or email for you to review.  Otherwise, I can obtain your verbal consent now.  All virtual visits are billed to your insurance company just like a normal visit would be.  By agreeing to a virtual visit, we'd like you to understand that the technology does not allow for your provider to perform an examination, and thus may limit your  provider's ability to fully assess your condition. If your provider identifies any concerns that need to be evaluated in person, we will make arrangements to do so.  Finally, though the technology is pretty good, we cannot assure that it will always work on either your or our end, and in the setting of a video visit, we may have to convert it to a phone-only visit.  In either situation, we cannot ensure that we have a secure connection.  Are you willing to proceed?" STAFF: Did the patient verbally acknowledge consent to telehealth visit? Document YES/NO here: YES  2. Confirm the BEST phone number to call the day of the visit by including in appointment notes  3. Give patient instructions for WebEx/MyChart download to smartphone as below or Doximity/Doxy.me if video visit (depending on what platform provider is using)  4. Advise patient to be prepared with their blood pressure, heart rate, weight, any heart rhythm information, their current medicines, and a piece of paper and pen handy for any instructions they may receive the day of their visit  5. Inform patient they will receive a phone call 15 minutes prior to their appointment time (may be from unknown caller ID) so they should be prepared to answer  6. Confirm that appointment type is correct in Epic appointment notes (VIDEO vs PHONE)     TELEPHONE CALL NOTE  TEAGAN HEIDRICK has been  deemed a candidate for a follow-up tele-health visit to limit community exposure during the Covid-19 pandemic. I spoke with the patient via phone to ensure availability of phone/video source, confirm preferred email & phone number, and discuss instructions and expectations.  I reminded DAKING WESTERVELT to be prepared with any vital sign and/or heart rhythm information that could potentially be obtained via home monitoring, at the time of his visit. I reminded RALSTON VENUS to expect a phone call at the time of his visit if his visit.  Venetia Maxon,  [place] 07/04/2018 2:48 PM   INSTRUCTIONS FOR DOWNLOADING THE WEBEX APP TO SMARTPHONE  - If Apple, ask patient to go to [employer] and type in WebEx in the search bar. [place] [employer], the blue/green circle. If Android, go to [employer] and type in [employer] in the search bar. The app is free but as with any other app downloads, their phone may require them to verify saved payment information or Apple/Android password.  - The patient does NOT have to create an account. - On the day of the visit, the assist will walk the patient through joining the meeting with the meeting number/password.  INSTRUCTIONS FOR DOWNLOADING THE MYCHART APP TO SMARTPHONE  - The patient must first make sure to have activated MyChart and know their login information - If Apple, go to [employer] and type in MyChart in the search bar and download the app. If Android, ask patient to go to [employer] and type in [place] in the search bar and download the app. The app is free but as with any other app downloads, their phone may require them to verify saved payment information or Apple/Android password.  - The patient will need to then log into the app with their MyChart username and password, and select [place] as their healthcare provider to link the account. When it is time for your visit, go to the MyChart app, find appointments, and click Begin Video Visit. Be sure to Select Allow for your device to access the Microphone and Camera for your visit. You will then be connected, and your provider will be with you shortly.  **If they have any issues connecting, or need assistance please contact MyChart service desk [phone])**  **If using a computer, in order to ensure the best quality for their visit they will need to use either of the following Internet Browsers: [employer], or Google Chrome**  IF USING DOXIMITY or DOXY.ME - The patient will receive a link just prior to their visit,  either by text or email (to be determined day of appointment depending on if it's doxy.me or Doximity).     FULL LENGTH CONSENT FOR TELE-HEALTH VISIT   I hereby voluntarily request, consent and authorize [facility] and its employed or contracted physicians, physician assistants, nurse practitioners or other licensed health care professionals (the Practitioner), to provide me with telemedicine health care services (the Services") as deemed necessary by the treating Practitioner. I acknowledge and consent to receive the Services by the Practitioner via telemedicine. I understand that the telemedicine visit will involve communicating with the Practitioner through live audiovisual communication technology and the disclosure of certain medical information by electronic transmission. I acknowledge that I have been given the opportunity to request an in-person assessment or other available alternative prior to the telemedicine visit and am voluntarily participating in the telemedicine visit.  I understand that I have the right to withhold or withdraw  my consent to the use of telemedicine in the course of my care at any time, without affecting my right to future care or treatment, and that the Practitioner or I may terminate the telemedicine visit at any time. I understand that I have the right to inspect all information obtained and/or recorded in the course of the telemedicine visit and may receive copies of available information for a reasonable fee.  I understand that some of the potential risks of receiving the Services via telemedicine include:   Delay or interruption in medical evaluation due to technological equipment failure or disruption;  Information transmitted may not be sufficient (e.g. poor resolution of images) to allow for appropriate medical decision making by the Practitioner; and/or   In rare instances, security protocols could fail, causing a breach of personal health  information.  Furthermore, I acknowledge that it is my responsibility to provide information about my medical history, conditions and care that is complete and accurate to the best of my ability. I acknowledge that Practitioner's advice, recommendations, and/or decision may be based on factors not within their control, such as incomplete or inaccurate data provided by me or distortions of diagnostic images or specimens that may result from electronic transmissions. I understand that the practice of medicine is not an exact science and that Practitioner makes no warranties or guarantees regarding treatment outcomes. I acknowledge that I will receive a copy of this consent concurrently upon execution via email to the email address I last provided but may also request a printed copy by calling the office of [place].    I understand that my insurance will be billed for this visit.   I have read or had this consent read to me.  I understand the contents of this consent, which adequately explains the benefits and risks of the Services being provided via telemedicine.   I have been provided ample opportunity to ask questions regarding this consent and the Services and have had my questions answered to my satisfaction.  I give my informed consent for the services to be provided through the use of telemedicine in my medical care  By participating in this telemedicine visit I agree to the above.  Spoke with patient and went over his medications, allergies, pharmacy, and history. He is ok with having a telephone visit on July 11, 2018 with Dr. Debara Pickett.

## 2018-07-05 ENCOUNTER — Telehealth: Payer: Self-pay | Admitting: *Deleted

## 2018-07-05 NOTE — Telephone Encounter (Signed)
Patient notified of time change for 07/08/2018 surgery with Dr. Oneida Alar. Instructed to be at [facility] admitting at 6:30 am. Reminded to hold Eliquis x 2 days prior and NPO past MN night prior. Follow the directions received from the hospital pre-admission department for medications am of and insulin adjustment for surgery. Verbalized understanding.

## 2018-07-07 ENCOUNTER — Telehealth: Payer: Self-pay | Admitting: *Deleted

## 2018-07-07 ENCOUNTER — Other Ambulatory Visit: Payer: Self-pay

## 2018-07-07 ENCOUNTER — Encounter (HOSPITAL_COMMUNITY): Payer: Self-pay | Admitting: *Deleted

## 2018-07-07 ENCOUNTER — Telehealth: Payer: Self-pay | Admitting: Internal Medicine

## 2018-07-07 ENCOUNTER — Other Ambulatory Visit: Payer: Self-pay | Admitting: *Deleted

## 2018-07-07 NOTE — Telephone Encounter (Signed)
Mychart pending, smartphone, pre reg complete 07/07/18 AF

## 2018-07-07 NOTE — Anesthesia Preprocedure Evaluation (Addendum)
Anesthesia Evaluation  Patient identified by MRN, date of birth, ID band Patient awake    Reviewed: Allergy & Precautions, NPO status , Patient's Chart, lab work & pertinent test results, reviewed documented beta blocker date and time   History of Anesthesia Complications Negative for: history of anesthetic complications  Airway Mallampati: III  TM Distance: >3 FB Neck ROM: Full    Dental  (+) Dental Advisory Given, Chipped,    Pulmonary sleep apnea ,    breath sounds clear to auscultation       Cardiovascular hypertension, Pt. on home beta blockers and Pt. on medications + Peripheral Vascular Disease and +CHF  + dysrhythmias Atrial Fibrillation  Rhythm:Regular Rate:Normal   Echo 04/23/2017 showed EF 55-60%, severe LVH, mild MR    Neuro/Psych negative neurological ROS  negative psych ROS   GI/Hepatic negative GI ROS, Neg liver ROS,   Endo/Other  diabetes, Poorly Controlled, Type 2, Insulin DependentMorbid obesity  Renal/GU ESRF and DialysisRenal disease     Musculoskeletal  (+) Arthritis , Rheumatoid disorders,    Abdominal   Peds  Hematology negative hematology ROS (+)   Anesthesia Other Findings   Reproductive/Obstetrics                           Anesthesia Physical Anesthesia Plan  ASA: III  Anesthesia Plan: MAC   Post-op Pain Management:    Induction: Intravenous  PONV Risk Score and Plan: 1 and Propofol infusion and Treatment may vary due to age or medical condition  Airway Management Planned: Natural Airway and Simple Face Mask  Additional Equipment: None  Intra-op Plan:   Post-operative Plan:   Informed Consent: I have reviewed the patients History and Physical, chart, labs and discussed the procedure including the risks, benefits and alternatives for the proposed anesthesia with the patient or authorized representative who has indicated his/her understanding and  acceptance.       Plan Discussed with: CRNA and Anesthesiologist  Anesthesia Plan Comments:       Anesthesia Quick Evaluation

## 2018-07-07 NOTE — Progress Notes (Addendum)
John Bailey denies chest pain or shortness of breath.  Patient denies that he or his family or anyone he has been in contact with  has experienced any of the following: Cough Fever >100.4 Runny Nose Sore Throat Difficulty breathing/ shortness of breath Travel in past 14 days- no  John Bailey has type II diabetes. Patient reports that CBGs run 100- 130. I instructed patient to take 7.5 units of Lantus at hs tonight. I instructed patient to check CBG after awaking and every 2 hours until arrival  to the hospital.  I Instructed patient if CBG is less than 70 to drink 1/2 cup of a clear juice. Recheck CBG in 15 minutes then call pre- op desk at [phone] for further instructions.   I instructed patient that if CBG is greater than 220 to take 1/2 of SS Insulin dose. John Bailey said he went over med list less than 2 weeks ago and did not want to review it with me.  I asked patient to bring a list in. When I instructed patient to take Diltiazem he replied, "I've been off of that for a long time." I told him that is why it is important to review list.

## 2018-07-07 NOTE — Telephone Encounter (Signed)
I called patient again today to remind him of surgery tomorrow 07/08/2018 and to be at [facility] admitting office at 6:30 am. To hold Eliquis for surgery. Verbalized understanding.

## 2018-07-08 ENCOUNTER — Encounter (HOSPITAL_COMMUNITY): Payer: Self-pay

## 2018-07-08 ENCOUNTER — Ambulatory Visit (HOSPITAL_COMMUNITY): Payer: BLUE CROSS/BLUE SHIELD | Admitting: Physician Assistant

## 2018-07-08 ENCOUNTER — Encounter (HOSPITAL_COMMUNITY)
Admission: RE | Disposition: A | Discharge status: Home / Self Care | Payer: Self-pay | Source: Home / Self Care | Attending: Vascular Surgery

## 2018-07-08 ENCOUNTER — Ambulatory Visit (HOSPITAL_COMMUNITY)
Admission: RE | Admit: 2018-07-08 | Discharge: 2018-07-08 | Disposition: A | Discharge status: Home / Self Care | Payer: BLUE CROSS/BLUE SHIELD | Attending: Vascular Surgery | Admitting: Vascular Surgery

## 2018-07-08 DIAGNOSIS — E1151 Type 2 diabetes mellitus with diabetic peripheral angiopathy without gangrene: Secondary | ICD-10-CM | POA: Insufficient documentation

## 2018-07-08 DIAGNOSIS — Z79899 Other long term (current) drug therapy: Secondary | ICD-10-CM | POA: Diagnosis not present

## 2018-07-08 DIAGNOSIS — I5032 Chronic diastolic (congestive) heart failure: Secondary | ICD-10-CM | POA: Diagnosis not present

## 2018-07-08 DIAGNOSIS — N185 Chronic kidney disease, stage 5: Secondary | ICD-10-CM

## 2018-07-08 DIAGNOSIS — I4892 Unspecified atrial flutter: Secondary | ICD-10-CM | POA: Insufficient documentation

## 2018-07-08 DIAGNOSIS — M069 Rheumatoid arthritis, unspecified: Secondary | ICD-10-CM | POA: Diagnosis not present

## 2018-07-08 DIAGNOSIS — Z7951 Long term (current) use of inhaled steroids: Secondary | ICD-10-CM | POA: Insufficient documentation

## 2018-07-08 DIAGNOSIS — E1140 Type 2 diabetes mellitus with diabetic neuropathy, unspecified: Secondary | ICD-10-CM | POA: Diagnosis not present

## 2018-07-08 DIAGNOSIS — Z791 Long term (current) use of non-steroidal anti-inflammatories (NSAID): Secondary | ICD-10-CM | POA: Diagnosis not present

## 2018-07-08 DIAGNOSIS — Z8249 Family history of ischemic heart disease and other diseases of the circulatory system: Secondary | ICD-10-CM | POA: Insufficient documentation

## 2018-07-08 DIAGNOSIS — I428 Other cardiomyopathies: Secondary | ICD-10-CM | POA: Insufficient documentation

## 2018-07-08 DIAGNOSIS — N186 End stage renal disease: Secondary | ICD-10-CM | POA: Insufficient documentation

## 2018-07-08 DIAGNOSIS — E1122 Type 2 diabetes mellitus with diabetic chronic kidney disease: Secondary | ICD-10-CM | POA: Insufficient documentation

## 2018-07-08 DIAGNOSIS — Z7901 Long term (current) use of anticoagulants: Secondary | ICD-10-CM | POA: Insufficient documentation

## 2018-07-08 DIAGNOSIS — Z794 Long term (current) use of insulin: Secondary | ICD-10-CM | POA: Diagnosis not present

## 2018-07-08 DIAGNOSIS — I4891 Unspecified atrial fibrillation: Secondary | ICD-10-CM | POA: Insufficient documentation

## 2018-07-08 DIAGNOSIS — Z992 Dependence on renal dialysis: Secondary | ICD-10-CM | POA: Diagnosis not present

## 2018-07-08 DIAGNOSIS — I132 Hypertensive heart and chronic kidney disease with heart failure and with stage 5 chronic kidney disease, or end stage renal disease: Secondary | ICD-10-CM | POA: Diagnosis not present

## 2018-07-08 HISTORY — DX: Personal history of other medical treatment: Z92.89

## 2018-07-08 HISTORY — PX: AV FISTULA PLACEMENT: SHX1204

## 2018-07-08 HISTORY — DX: Anemia, unspecified: D64.9

## 2018-07-08 LAB — PROTIME-INR
INR: 1.1 (ref 0.8–1.2)
Prothrombin Time: 14.1 seconds (ref 11.4–15.2)

## 2018-07-08 LAB — POCT I-STAT 4, (NA,K, GLUC, HGB,HCT)
Glucose, Bld: 245 mg/dL — ABNORMAL HIGH (ref 70–99)
HCT: 31 % — ABNORMAL LOW (ref 39.0–52.0)
Hemoglobin: 10.5 g/dL — ABNORMAL LOW (ref 13.0–17.0)
Potassium: 4.1 mmol/L (ref 3.5–5.1)
Sodium: 135 mmol/L (ref 135–145)

## 2018-07-08 LAB — GLUCOSE, CAPILLARY
Glucose-Capillary: 245 mg/dL — ABNORMAL HIGH (ref 70–99)
Glucose-Capillary: 209 mg/dL — ABNORMAL HIGH (ref 70–99)
Glucose-Capillary: 185 mg/dL — ABNORMAL HIGH (ref 70–99)

## 2018-07-08 SURGERY — ARTERIOVENOUS (AV) FISTULA CREATION
Anesthesia: Monitor Anesthesia Care | Site: Arm Upper | Laterality: Left

## 2018-07-08 MED ORDER — PHENYLEPHRINE HCL (PRESSORS) 10 MG/ML IV SOLN
INTRAVENOUS | Status: DC | PRN
  Administered 2018-07-08 (×4): 80 ug via INTRAVENOUS

## 2018-07-08 MED ORDER — MIDAZOLAM HCL 2 MG/2ML IJ SOLN
INTRAMUSCULAR | Status: AC
  Filled 2018-07-08: qty 2

## 2018-07-08 MED ORDER — 0.9 % SODIUM CHLORIDE (POUR BTL) OPTIME
TOPICAL | Status: DC | PRN
  Administered 2018-07-08: 10:00:00 1000 mL

## 2018-07-08 MED ORDER — CHLORHEXIDINE GLUCONATE 4 % EX LIQD: 60.0000 mL | Freq: Once | CUTANEOUS | Status: DC

## 2018-07-08 MED ORDER — SODIUM CHLORIDE 0.9 % IV SOLN
INTRAVENOUS | Status: DC
  Administered 2018-07-08: 07:00:00 via INTRAVENOUS

## 2018-07-08 MED ORDER — SODIUM CHLORIDE 0.9 % IV SOLN
INTRAVENOUS | Status: DC | PRN
  Administered 2018-07-08: 10:00:00 30 ug/min via INTRAVENOUS

## 2018-07-08 MED ORDER — PROPOFOL 10 MG/ML IV BOLUS
INTRAVENOUS | Status: DC | PRN
  Administered 2018-07-08: 150 mg via INTRAVENOUS

## 2018-07-08 MED ORDER — FENTANYL CITRATE (PF) 250 MCG/5ML IJ SOLN
INTRAMUSCULAR | Status: AC
  Filled 2018-07-08: qty 5

## 2018-07-08 MED ORDER — SODIUM CHLORIDE 0.9 % IV SOLN: INTRAVENOUS | Status: DC

## 2018-07-08 MED ORDER — INSULIN ASPART 100 UNIT/ML ~~LOC~~ SOLN
5.0000 [IU] | Freq: Once | SUBCUTANEOUS | Status: AC
  Administered 2018-07-08: 5 [IU] via SUBCUTANEOUS
  Filled 2018-07-08: qty 1

## 2018-07-08 MED ORDER — MIDAZOLAM HCL 5 MG/5ML IJ SOLN
INTRAMUSCULAR | Status: DC | PRN
  Administered 2018-07-08: 1 mg via INTRAVENOUS

## 2018-07-08 MED ORDER — FENTANYL CITRATE (PF) 100 MCG/2ML IJ SOLN
INTRAMUSCULAR | Status: DC | PRN
  Administered 2018-07-08: 25 ug via INTRAVENOUS

## 2018-07-08 MED ORDER — PROPOFOL 10 MG/ML IV BOLUS
INTRAVENOUS | Status: AC
  Filled 2018-07-08: qty 20

## 2018-07-08 MED ORDER — CEFAZOLIN SODIUM-DEXTROSE 2-4 GM/100ML-% IV SOLN
2.0000 g | INTRAVENOUS | Status: AC
  Administered 2018-07-08: 09:00:00 3 g via INTRAVENOUS
  Filled 2018-07-08: qty 100

## 2018-07-08 MED ORDER — SODIUM CHLORIDE 0.9 % IV SOLN
INTRAVENOUS | Status: DC | PRN
  Administered 2018-07-08: 500 mL

## 2018-07-08 MED ORDER — PROPOFOL 500 MG/50ML IV EMUL
INTRAVENOUS | Status: DC | PRN
  Administered 2018-07-08: 80 ug/kg/min via INTRAVENOUS

## 2018-07-08 MED ORDER — LIDOCAINE HCL (PF) 1 % IJ SOLN
INTRAMUSCULAR | Status: AC
  Filled 2018-07-08: qty 30

## 2018-07-08 MED ORDER — LIDOCAINE HCL (PF) 1 % IJ SOLN
INTRAMUSCULAR | Status: DC | PRN
  Administered 2018-07-08: 10 mL

## 2018-07-08 MED ORDER — HYDROCODONE-ACETAMINOPHEN 5-325 MG PO TABS: 1.0000 | ORAL_TABLET | Freq: Four times a day (QID) | ORAL | 0 refills | Status: DC | PRN

## 2018-07-08 MED ORDER — CEFAZOLIN SODIUM-DEXTROSE 1-4 GM/50ML-% IV SOLN
INTRAVENOUS | Status: AC
  Filled 2018-07-08: qty 50

## 2018-07-08 MED ORDER — LIDOCAINE HCL (CARDIAC) PF 100 MG/5ML IV SOSY
PREFILLED_SYRINGE | INTRAVENOUS | Status: DC | PRN
  Administered 2018-07-08: 40 mg via INTRATRACHEAL

## 2018-07-08 MED ORDER — ONDANSETRON HCL 4 MG/2ML IJ SOLN
INTRAMUSCULAR | Status: DC | PRN
  Administered 2018-07-08: 4 mg via INTRAVENOUS

## 2018-07-08 MED ORDER — SODIUM CHLORIDE 0.9 % IV SOLN
INTRAVENOUS | Status: AC
  Filled 2018-07-08: qty 1.2

## 2018-07-08 SURGICAL SUPPLY — 39 items
ADH SKN CLS APL DERMABOND .7 (GAUZE/BANDAGES/DRESSINGS) ×1
ARMBAND PINK RESTRICT EXTREMIT (MISCELLANEOUS) ×3 IMPLANT
CANISTER SUCT 3000ML PPV (MISCELLANEOUS) ×2 IMPLANT
CANNULA VESSEL 3MM 2 BLNT TIP (CANNULA) ×2 IMPLANT
CLIP VESOCCLUDE MED 6/CT (CLIP) ×2 IMPLANT
CLIP VESOCCLUDE SM WIDE 6/CT (CLIP) ×2 IMPLANT
COVER PROBE W GEL 5X96 (DRAPES) IMPLANT
COVER WAND RF STERILE (DRAPES) ×2 IMPLANT
DECANTER SPIKE VIAL GLASS SM (MISCELLANEOUS) ×2 IMPLANT
DERMABOND ADVANCED (GAUZE/BANDAGES/DRESSINGS) ×1
DERMABOND ADVANCED .7 DNX12 (GAUZE/BANDAGES/DRESSINGS) ×1 IMPLANT
DRAIN PENROSE 1/4X12 LTX STRL (WOUND CARE) ×2 IMPLANT
ELECT REM PT RETURN 9FT ADLT (ELECTROSURGICAL) ×2
ELECTRODE REM PT RTRN 9FT ADLT (ELECTROSURGICAL) ×1 IMPLANT
GLOVE BIO SURGEON STRL SZ7.5 (GLOVE) ×3 IMPLANT
GLOVE BIOGEL PI IND STRL 6.5 (GLOVE) IMPLANT
GLOVE BIOGEL PI IND STRL 8 (GLOVE) IMPLANT
GLOVE BIOGEL PI INDICATOR 6.5 (GLOVE) ×2
GLOVE BIOGEL PI INDICATOR 8 (GLOVE) ×1
GLOVE ECLIPSE 8.0 STRL XLNG CF (GLOVE) ×2 IMPLANT
GOWN STRL REUS W/ TWL LRG LVL3 (GOWN DISPOSABLE) ×3 IMPLANT
GOWN STRL REUS W/ TWL XL LVL3 (GOWN DISPOSABLE) IMPLANT
GOWN STRL REUS W/TWL LRG LVL3 (GOWN DISPOSABLE) ×6
GOWN STRL REUS W/TWL XL LVL3 (GOWN DISPOSABLE) ×4
KIT BASIN OR (CUSTOM PROCEDURE TRAY) ×2 IMPLANT
KIT TURNOVER KIT B (KITS) ×2 IMPLANT
LOOP VESSEL MINI RED (MISCELLANEOUS) IMPLANT
NS IRRIG 1000ML POUR BTL (IV SOLUTION) ×2 IMPLANT
PACK CV ACCESS (CUSTOM PROCEDURE TRAY) ×2 IMPLANT
PAD ARMBOARD 7.5X6 YLW CONV (MISCELLANEOUS) ×4 IMPLANT
SPONGE SURGIFOAM ABS GEL 100 (HEMOSTASIS) IMPLANT
SUT PROLENE 6 0 BV (SUTURE) ×2 IMPLANT
SUT PROLENE 7 0 BV 1 (SUTURE) ×2 IMPLANT
SUT VIC AB 3-0 SH 27 (SUTURE) ×2
SUT VIC AB 3-0 SH 27X BRD (SUTURE) ×1 IMPLANT
SUT VICRYL 4-0 PS2 18IN ABS (SUTURE) ×2 IMPLANT
TOWEL GREEN STERILE (TOWEL DISPOSABLE) ×2 IMPLANT
UNDERPAD 30X30 (UNDERPADS AND DIAPERS) ×2 IMPLANT
WATER STERILE IRR 1000ML POUR (IV SOLUTION) ×2 IMPLANT

## 2018-07-08 NOTE — Discharge Instructions (Signed)
° °  [facility] [place] ° °Discharge Instructions ° °AV Fistula or Graft Surgery for Dialysis Access ° °Please refer to the following instructions for your post-procedure care. Your surgeon or physician assistant will discuss any changes with you. ° °Activity ° °You may drive the day following your surgery, if you are comfortable and no longer taking prescription pain medication. Resume full activity as the soreness in your incision resolves. ° °Bathing/Showering ° °You may shower after you go home. Keep your incision dry for 48 hours. Do not soak in a bathtub, hot tub, or swim until the incision heals completely. You may not shower if you have a hemodialysis catheter. ° °Incision Care ° °Clean your incision with mild soap and water after 48 hours. Pat the area dry with a clean towel. You do not need a bandage unless otherwise instructed. Do not apply any ointments or creams to your incision. You may have skin glue on your incision. Do not peel it off. It will come off on its own in about one week. Your arm may swell a bit after surgery. To reduce swelling use pillows to elevate your arm so it is above your heart. Your doctor will tell you if you need to lightly wrap your arm with an ACE bandage. ° °Diet ° °Resume your normal diet. There are not special food restrictions following this procedure. In order to heal from your surgery, it is CRITICAL to get adequate nutrition. Your body requires vitamins, minerals, and protein. Vegetables are the best source of vitamins and minerals. Vegetables also provide the perfect balance of protein. Processed food has little nutritional value, so try to avoid this. ° °Medications ° °Resume taking all of your medications. If your incision is causing pain, you may take over-the counter pain relievers such as acetaminophen (Tylenol). If you were prescribed a stronger pain medication, please be aware these medications can cause nausea and constipation. Prevent  nausea by taking the medication with a snack or meal. Avoid constipation by drinking plenty of fluids and eating foods with high amount of fiber, such as fruits, vegetables, and grains. Do not take Tylenol if you are taking prescription pain medications. ° ° ° ° °Follow up °Your surgeon may want to see you in the office following your access surgery. If so, this will be arranged at the time of your surgery. ° °Please call us immediately for any of the following conditions: ° °Increased pain, redness, drainage (pus) from your incision site °Fever of 101 degrees or higher °Severe or worsening pain at your incision site °Hand pain or numbness. ° °Reduce your risk of vascular disease: ° °Stop smoking. If you would like help, call [facility] at 1-800-QUIT-NOW (1-800-784-8669) or  at 336-586-4000 ° °Manage your cholesterol °Maintain a desired weight °Control your diabetes °Keep your blood pressure down ° °Dialysis ° °It will take several weeks to several months for your new dialysis access to be ready for use. Your surgeon will determine when it is OK to use it. Your nephrologist will continue to direct your dialysis. You can continue to use your Permcath until your new access is ready for use. ° °If you have any questions, please call the office at [phone]. ° °

## 2018-07-08 NOTE — Progress Notes (Signed)
CBG=245.  Dr. Fransisco Beau notified. Verbal order to administer 5 U Insulin Novolog given. Will continue to monitor.

## 2018-07-08 NOTE — Op Note (Signed)
Procedure: Left Brachial Cephalic AV fistula  Preop: ESRD  Postop: ESRD  Anesthesia: General  Assistant: Arlee Muslim MD  Findings: 3.5 mm cephalic vein  Procedure: After obtaining informed consent, the patient was taken to the operating room.  After induction of general anesthesia, the left upper extremity was prepped and draped in usual sterile fashion.  A transverse incision was then made near the antecubital crease the left arm. The incision was carried into the subcutaneous tissues down to level of the cephalic vein. The cephalic vein was approximately 3.5 mm in diameter. It was of good quality. This was dissected free circumferentially and small side branches ligated and divided between silk ties or clips. Next the brachial artery was dissected free in the medial portion of the incision. The artery was  3-4 mm in diameter. The was some plaque at the brachial bifurcation. The vessel loops were placed proximal and distal to the planned site of arteriotomy. The patient was given 5000 units of intravenous heparin. After appropriate circulation time, the vessel loops were used to control the artery. A longitudinal opening was made in the brachial artery.  The vein was ligated distally with a 2-0 silk tie. The vein was controlled proximally with a fine bulldog clamp. The vein was then swung over to the artery and sewn end of vein to side of artery using a running 7-0 Prolene suture. Just prior to completion of the anastomosis, everything was fore bled back bled and thoroughly flushed. The anastomosis was secured, vessel loops released, and there was a palpable thrill in the fistula immediately. After hemostasis was obtained, the subcutaneous tissues were reapproximated using a running 3-0 Vicryl suture. The skin was then closed with a 4 Vicryl subcuticular stitch. Dermabond was applied to the skin incision.  The patient had a monophasic radial and triphasic ulnar signal at the end of the  case.  Ruta Hinds, MD [facility] Office: [phone] Pager: [phone]

## 2018-07-08 NOTE — Transfer of Care (Signed)
Immediate Anesthesia Transfer of Care Note  Patient: John Bailey  Procedure(s) Performed: BRACHIOCEPHALIC ARTERIOVENOUS (AV) FISTULA CREATION LEFT ARM (Left Arm Upper)  Patient Location: PACU  Anesthesia Type:General  Level of Consciousness: awake, alert  and oriented  Airway & Oxygen Therapy: Patient Spontanous Breathing and Patient connected to nasal cannula oxygen  Post-op Assessment: Report given to RN, Post -op Vital signs reviewed and stable and Patient moving all extremities  Post vital signs: Reviewed and stable  Last Vitals:  Vitals Value Taken Time  BP 117/104 07/08/2018 10:36 AM  Temp    Pulse 79 07/08/2018 10:39 AM  Resp 25 07/08/2018 10:39 AM  SpO2 97 % 07/08/2018 10:39 AM  Vitals shown include unvalidated device data.  Last Pain:  Vitals:   07/08/18 0655  TempSrc:   PainSc: 0-No pain      Patients Stated Pain Goal: 2 (16/01/09 3235)  Complications: No apparent anesthesia complications

## 2018-07-08 NOTE — Anesthesia Procedure Notes (Signed)
Procedure Name: LMA Insertion Date/Time: 07/08/2018 9:29 AM Performed by: Scheryl Darter, CRNA Pre-anesthesia Checklist: Patient identified, Emergency Drugs available, Suction available and Patient being monitored Patient Re-evaluated:Patient Re-evaluated prior to induction Oxygen Delivery Method: Circle System Utilized Preoxygenation: Pre-oxygenation with 100% oxygen Induction Type: IV induction Ventilation: Mask ventilation without difficulty LMA: LMA inserted LMA Size: 5.0 Number of attempts: 1 Airway Equipment and Method: Bite block Placement Confirmation: positive ETCO2 Tube secured with: Tape Dental Injury: Teeth and Oropharynx as per pre-operative assessment

## 2018-07-08 NOTE — H&P (Signed)
Referring Physician: Dr Hollie Salk  Patient name: John Bailey       MRN: [id number]        DOB: 1970-07-12          Sex: male  REASON FOR CONSULT: hemodialysis access  HPI: John Bailey is a 48 y.o. male, with end-stage renal disease.  His current dialysis today is Tuesday Thursday Saturday.  He had a left radiocephalic AV fistula placed February 2019.  This has been a poor access site for him.  It is essentially failing at this point.  He does have some baseline left hand dysfunction but this was prior to his fistula placement.  He does have a history of atrial fibrillation and is on Eliquis for this.  It is followed by Dr. Debara Pickett.  Other medical problems include hypertension, diabetes rheumatoid arthritis all of which are currently stable.      Past Medical History:  Diagnosis Date  . Atrial flutter with rapid ventricular response ([place]) 08/06/2015   a. s/p DCCV in 07/2015 with recurrent atrial fibrillation and DCCV in 12/2016. Initially successful but noted to be back in atrial fibrillation within 2 weeks of DCCV.   [name] Chronic diastolic (congestive) heart failure (H[facility])    a. EF 50-55% by echo in 06/2016.  . Diabetes ([place])   . Dysrhythmia    Aflutter  . ESRD (end stage renal disease) on dialysis ([place])    "TTS; at [facility]" (06/08/2017)  . Hypertension   . Peripheral vascular disease ([place])   . Rheumatoid arthritis [facility])         Past Surgical History:  Procedure Laterality Date  . A/V FISTULAGRAM Left 10/29/2017   Procedure: A/V FISTULAGRAM;  Surgeon: Elam Dutch, MD;  Location: [place] CV LAB;  Service: Cardiovascular;  Laterality: Left;  . AV FISTULA PLACEMENT Left 05/10/2017   Procedure: CREATION of Left Radicephalic Fistula;  Surgeon: Rosetta Posner, MD;  Location: [place];  Service: Vascular;  Laterality: Left;  . CARDIOVERSION N/A 08/01/2015   Procedure: CARDIOVERSION;  Surgeon: Josue Hector, MD;  Location: [facility] ENDOSCOPY;  Service: Cardiovascular;  Laterality:  N/A;  . CARDIOVERSION N/A 12/18/2016   Procedure: CARDIOVERSION;  Surgeon: [name], MD;  Location: [facility] ENDOSCOPY;  Service: Cardiovascular;  Laterality: N/A;  . CARDIOVERSION N/A 04/30/2017   Procedure: CARDIOVERSION;  Surgeon: Larey Dresser, MD;  Location: [facility] ENDOSCOPY;  Service: Cardiovascular;  Laterality: N/A;  . CHOLECYSTECTOMY    . DEBRIDMENT OF DECUBITUS ULCER N/A 06/04/2017   Procedure: DEBRIDMENT OF SACRAL DECUBITUS ULCER;  Surgeon: Georganna Skeans, MD;  Location: [place];  Service: General;  Laterality: N/A;  . INCISION AND DRAINAGE ABSCESS N/A 06/14/2017   Procedure: INCISION AND DRAINAGE GLUTEAL ABSCESS;  Surgeon: Kieth Brightly Arta Bruce, MD;  Location: [place];  Service: General;  Laterality: N/A;  Packing to coccyx with packing and drain to right buttock  . INSERTION OF DIALYSIS CATHETER Right 05/04/2017   Procedure: INSERTION OF TUNNELED  DIALYSIS CATHETER;  Surgeon: Conrad , MD;  Location: [place];  Service: Vascular;  Laterality: Right;  . IR FLUORO GUIDE CV LINE RIGHT  04/27/2017  . IR US GUIDE VASC ACCESS RIGHT  04/27/2017  . PERIPHERAL VASCULAR BALLOON ANGIOPLASTY  10/29/2017   Procedure: PERIPHERAL VASCULAR BALLOON ANGIOPLASTY;  Surgeon: Elam Dutch, MD;  Location: [place] CV LAB;  Service: Cardiovascular;;  Lt arm Fistula   . TEE WITHOUT CARDIOVERSION N/A 08/01/2015   Procedure: TRANSESOPHAGEAL ECHOCARDIOGRAM (TEE);  Surgeon: Josue Hector,  MD;  Location: [place] ENDOSCOPY;  Service: Cardiovascular;  Laterality: N/A;         Family History  Problem Relation Age of Onset  . Heart disease Mother   . Hyperlipidemia Father   . Hypertension Father     SOCIAL HISTORY: Social History        Socioeconomic History  . Marital status: Married    Spouse name: Not on file  . Number of children: Not on file  . Years of education: Not on file  . Highest education level: Not on file  Occupational History  . Not on file  Social Needs  .  Financial resource strain: Not on file  . Food insecurity:    Worry: Not on file    Inability: Not on file  . Transportation needs:    Medical: Not on file    Non-medical: Not on file  Tobacco Use  . Smoking status: Never Smoker  . Smokeless tobacco: Never Used  Substance and Sexual Activity  . Alcohol use: No  . Drug use: No  . Sexual activity: Yes  Lifestyle  . Physical activity:    Days per week: Not on file    Minutes per session: Not on file  . Stress: Not on file  Relationships  . Social connections:    Talks on phone: Not on file    Gets together: Not on file    Attends religious service: Not on file    Active member of club or organization: Not on file    Attends meetings of clubs or organizations: Not on file    Relationship status: Not on file  . Intimate partner violence:    Fear of current or ex partner: Not on file    Emotionally abused: Not on file    Physically abused: Not on file    Forced sexual activity: Not on file  Other Topics Concern  . Not on file  Social History Narrative  . Not on file    No Known Allergies        Current Outpatient Medications  Medication Sig Dispense Refill  . acetaminophen-codeine (TYLENOL #3) 300-30 MG tablet Take by mouth.    [name] albuterol (PROVENTIL HFA;VENTOLIN HFA) 108 (90 Base) MCG/ACT inhaler Inhale into the lungs.    [name] amiodarone (PACERONE) 200 MG tablet Take 1 tablet (200 mg total) by mouth daily. 30 tablet 0  . apixaban (ELIQUIS) 5 MG TABS tablet Take 1 tablet (5 mg total) by mouth 2 (two) times daily. 60 tablet 0  . azithromycin (ZITHROMAX) 250 MG tablet TAKE 2 TABLETS BY MOUTH ON DAY 1, THEN TAKE 1 TABLET DAILY ON DAYS 2-5    . B Complex-C-Folic Acid (DIALYVITE PO) Take 1 tablet by mouth daily with supper.    . calcitRIOL (ROCALTROL) 0.5 MCG capsule Take 1 capsule (0.5 mcg total) by mouth every Tuesday, Thursday, and Saturday at 6 PM. 90 capsule 0  . carvedilol (COREG)  6.25 MG tablet Take 1 tablet (6.25 mg total) by mouth 2 (two) times daily with a meal. 60 tablet 0  . collagenase (SANTYL) ointment Apply topically daily.    . Continuous Blood Gluc Sensor (FREESTYLE LIBRE 14 DAY SENSOR) MISC USE TO TEST BLOOD SUGAR. CHANGE EVERY 14 DAYS TO A NEW SENSOR ON NEW PLACE  0  . diclofenac sodium (VOLTAREN) 1 % GEL Apply 4 g topically 2 (two) times daily as needed (apply over affected area). (Patient taking differently: Apply 4 g topically 2 (two) times  daily as needed (for pain). )    . diltiazem (TIAZAC) 180 MG 24 hr capsule Take 180 mg by mouth daily.    [name] docusate sodium (COLACE) 100 MG capsule Take 100 mg by mouth daily as needed for mild constipation.    . feeding supplement, GLUCERNA SHAKE, (GLUCERNA SHAKE) LIQD Take 237 mLs by mouth 3 (three) times daily between meals.  0  . gabapentin (NEURONTIN) 300 MG capsule Take 300 mg by mouth daily.    . Glucose Blood (FREESTYLE LITE TEST VI)     . insulin glargine (LANTUS) 100 UNIT/ML injection Inject 0.15 mLs (15 Units total) into the skin at bedtime.    . Lancets (FREESTYLE) lancets     . lidocaine-prilocaine (EMLA) cream APPLY SMALL AMOUNT TO ACCESS SITE (AVF) 3 TIMES A WEEK 1 HOUR BEFORE DIALYSIS. COVER WITH OCCLUSIVE DRESSING (SARAN WRAP)    . NONFORMULARY OR COMPOUNDED ITEM [facility]:  Peripheral Neuropathy Cream -Bupivacaine 1%, Doxepin 3%, Gabapentin 6%, Pentoxifylline 3%. Topiramate 1%, apply 1-2 grams to affected area 3-4 times a day. 30 each 5  . NOVOLOG FLEXPEN 100 UNIT/ML FlexPen Inject 2-12 Units as directed 3 (three) times daily with meals. Per sliding scale  0  . nystatin cream (MYCOSTATIN)     . predniSONE (DELTASONE) 10 MG tablet Take 1 tablet (10 mg total) by mouth daily with breakfast. Last dose 4/25 (Patient taking differently: Take 7.5 mg by mouth daily with breakfast. )    . SURE COMFORT PEN NEEDLES 31G X 5 MM MISC USE AS DIRECTED FOR insulin     No current  facility-administered medications for this visit.     ROS:   General:  No weight loss, Fever, chills  HEENT: No recent headaches, no nasal bleeding, no visual changes, no sore throat  Neurologic: No dizziness, blackouts, seizures. No recent symptoms of stroke or mini- stroke. No recent episodes of slurred speech, or temporary blindness.  Cardiac: No recent episodes of chest pain/pressure, no shortness of breath at rest.  No shortness of breath with exertion.  Denies history of atrial fibrillation or irregular heartbeat  Vascular: No history of rest pain in feet.  No history of claudication.  No history of non-healing ulcer, No history of DVT   Pulmonary: No home oxygen, no productive cough, no hemoptysis,  No asthma or wheezing  Musculoskeletal:  [X]  Arthritis, [ ]  Low back pain,  [X]  Joint pain  Hematologic:No history of hypercoagulable state.  No history of easy bleeding.  No history of anemia  Gastrointestinal: No hematochezia or melena,  No gastroesophageal reflux, no trouble swallowing  Urinary: [X]  chronic Kidney disease, [X]  on HD - [ ]  MWF or [X]  TTHS, [ ]  Burning with urination, [ ]  Frequent urination, [ ]  Difficulty urinating;   Skin: No rashes  Psychological: No history of anxiety,  No history of depression   Physical Examination  Vitals:   07/08/18 0645  BP: (!) 167/92  Pulse: 94  Resp: 20  Temp: 98.8 F (37.1 C)  TempSrc: Oral  SpO2: 98%  Weight: 136.1 kg  Height: 5\' 11"  (1.803 m)     Body mass index is 43.83 kg/m.  General:  Alert and oriented, no acute distress HEENT: Normal Neck: No bruit or JVD, right side tunneled dialysis catheter Pulmonary: Clear to auscultation bilaterally Cardiac: Regular Rate and Rhythm Skin: No rash Extremity Pulses:  2+ radial, brachial pulses bilaterally, no palpable flow left radiocephalic AV fistula.  Upper arms are obese bilaterally. Musculoskeletal: No deformity  or edema      Neurologic: Upper  and lower extremity motor 5/5 and symmetric  DATA:  Had an upper extremity arterial duplex exam today which shows normal anatomic configuration at the brachial bifurcation at the antecubital region.  Arteries 5 mm diameter.  No stenosis.  Vein mapping showed right upper arm cephalic 3 to 4 mm basilic 5 mm left cephalic upper arm 4 to 5 mm basilic 5 mm  ASSESSMENT: Patient needs new hemodialysis access.   PLAN: Left brachiocephalic AV fistula scheduled for June 06, 2018.  Risk benefits possible complications and procedure details were explained to the patient today including but not limited to bleeding infection ischemic steal fistula thrombosis non-maturation.  He understands and agrees to proceed.   Ruta Hinds, MD [facility] [place] Office: [phone] Pager: [phone]

## 2018-07-08 NOTE — Anesthesia Postprocedure Evaluation (Signed)
Anesthesia Post Note  Patient: John Bailey  Procedure(s) Performed: BRACHIOCEPHALIC ARTERIOVENOUS (AV) FISTULA CREATION LEFT ARM (Left Arm Upper)     Patient location during evaluation: PACU Anesthesia Type: General Level of consciousness: awake and alert Pain management: pain level controlled Vital Signs Assessment: post-procedure vital signs reviewed and stable Respiratory status: spontaneous breathing, nonlabored ventilation and respiratory function stable Cardiovascular status: stable and blood pressure returned to baseline Postop Assessment: no apparent nausea or vomiting Anesthetic complications: no    Last Vitals:  Vitals:   07/08/18 1100 07/08/18 1105  BP:  (!) 103/57  Pulse: 79   Resp: 20 (!) 21  Temp:  36.5 C  SpO2: 96%     Last Pain:  Vitals:   07/08/18 0655  TempSrc:   PainSc: 0-No pain                 Audry Pili

## 2018-07-09 ENCOUNTER — Encounter (HOSPITAL_COMMUNITY): Payer: Self-pay | Admitting: Vascular Surgery

## 2018-07-11 ENCOUNTER — Telehealth (INDEPENDENT_AMBULATORY_CARE_PROVIDER_SITE_OTHER): Payer: BLUE CROSS/BLUE SHIELD | Admitting: Internal Medicine

## 2018-07-11 ENCOUNTER — Encounter: Payer: Self-pay | Admitting: Internal Medicine

## 2018-07-11 ENCOUNTER — Telehealth: Payer: Self-pay | Admitting: *Deleted

## 2018-07-11 DIAGNOSIS — I5032 Chronic diastolic (congestive) heart failure: Secondary | ICD-10-CM

## 2018-07-11 DIAGNOSIS — I110 Hypertensive heart disease with heart failure: Secondary | ICD-10-CM | POA: Diagnosis not present

## 2018-07-11 DIAGNOSIS — I4819 Other persistent atrial fibrillation: Secondary | ICD-10-CM

## 2018-07-11 DIAGNOSIS — I10 Essential (primary) hypertension: Secondary | ICD-10-CM

## 2018-07-11 DIAGNOSIS — I482 Chronic atrial fibrillation, unspecified: Secondary | ICD-10-CM | POA: Diagnosis not present

## 2018-07-11 DIAGNOSIS — N186 End stage renal disease: Secondary | ICD-10-CM | POA: Diagnosis not present

## 2018-07-11 DIAGNOSIS — Z992 Dependence on renal dialysis: Secondary | ICD-10-CM

## 2018-07-11 MED ORDER — CARVEDILOL 3.125 MG PO TABS: 3.1250 mg | ORAL_TABLET | Freq: Two times a day (BID) | ORAL | 3 refills | Status: AC

## 2018-07-11 MED ORDER — CARVEDILOL 3.125 MG PO TABS: 3.1250 mg | ORAL_TABLET | Freq: Two times a day (BID) | ORAL | 3 refills | Status: DC

## 2018-07-11 NOTE — Telephone Encounter (Signed)
Left message for patient to call and schedule 1 month nurse visit for EKG per 07/11/18 staff message

## 2018-07-11 NOTE — Patient Instructions (Addendum)
Medication Instructions:  Decrease: Coreg to 3.125 mg two times a day Stop: Amiodarone  If you need a refill on your cardiac medications before your next appointment, please call your pharmacy.   Lab work: None If you have labs (blood work) drawn today and your tests are completely normal, you will receive your results only by: [name] MyChart Message (if you have MyChart) OR . A paper copy in the mail If you have any lab test that is abnormal or we need to change your treatment, we will call you to review the results.  Testing/Procedures: None  Follow-Up: At [facility], you and your health needs are our priority.  As part of our continuing mission to provide you with exceptional heart care, we have created designated Provider Care Teams.  These Care Teams include your primary Cardiologist (physician) and Advanced Practice Providers (APPs -  Physician Assistants and Nurse Practitioners) who all work together to provide you with the care you need, when you need it. You will need a follow up appointment in 6 months.  Please call our office 2 months in advance to schedule this appointment.  You may see Pixie Casino, MD or one of the following Advanced Practice Providers on your designated Care Team: [place], [place] . Fabian Sharp, PA-C  Your physician recommends that you schedule a follow-up Nurse visit in 1 month for an EKG.

## 2018-07-14 ENCOUNTER — Encounter: Payer: Self-pay | Admitting: Internal Medicine

## 2018-07-14 ENCOUNTER — Telehealth: Payer: Self-pay | Admitting: Vascular Surgery

## 2018-07-14 NOTE — Telephone Encounter (Signed)
-----   Message from Dagoberto Ligas, PA-C sent at 07/08/2018 10:39 AM EDT -----  Can you schedule an appt for this pt in 4-6 weeks with fistula duplex for Fields to see.  PO L brachiocephalic fistula Thank, Catalina Antigua

## 2018-07-14 NOTE — Telephone Encounter (Signed)
sch appt lvm [facility]ld ltr 08/18/2018 9am Dialysis Duplex 940am p/o MD

## 2018-07-14 NOTE — Progress Notes (Signed)
Virtual Visit via Video Note   This visit type was conducted due to national recommendations for restrictions regarding the COVID-19 Pandemic (e.g. social distancing) in an effort to limit this patient's exposure and mitigate transmission in our community.  Due to his co-morbid illnesses, this patient is at least at moderate risk for complications without adequate follow up.  This format is felt to be most appropriate for this patient at this time.  All issues noted in this document were discussed and addressed.  A limited physical exam was performed with this format.  Please refer to the patient's chart for his consent to telehealth for [facility].   Evaluation Performed:  Doximity video visit  Date:  07/14/2018   ID:  John Bailey, DOB Oct 14, 1970, MRN [id number]  Patient Location:  [place] [address]  Provider location:   [address] [place],  [address]  PCP:  [name], MD  Cardiologist:  Pixie Casino, MD Electrophysiologist:  None   Chief Complaint:  No complaints  History of Present Illness:    John Bailey is a 48 y.o. male who presents via audio/video conferencing for a telehealth visit today.  John Bailey was seen today for annual follow-up.  Recently he underwent a placement of AV fistula as he had difficulty with accessing the other fistula.  Past medical history significant for atrial flutter status post TEE cardioversion.  At the time he had a nonischemic cardiomyopathy with EF 25 to 35% which is subsequently improved to 50 to 55% by echo in 2018.  He also has chronic kidney disease, end-stage renal disease on dialysis, type 2 diabetes, dyslipidemia and hypertensive heart disease.  He has been chronically anticoagulated on Eliquis with a CHADSVASC score of 5.  He is without complaints today, denying any shortness of breath, chest pain or other associated cardiac symptoms.  The patient does not have symptoms  concerning for COVID-19 infection (fever, chills, cough, or new SHORTNESS OF BREATH).    Prior CV studies:   The following studies were reviewed today:  Lab work, recent surgery, chart review  PMHx:  Past Medical History:  Diagnosis Date  . Anemia   . Atrial flutter with rapid ventricular response ([place]) 08/06/2015   a. s/p DCCV in 07/2015 with recurrent atrial fibrillation and DCCV in 12/2016. Initially successful but noted to be back in atrial fibrillation within 2 weeks of DCCV.   [name] Chronic diastolic (congestive) heart failure (HCC)    a. EF 50-55% by echo in 06/2016.  . Diabetes ([place])    Type II  . Dysrhythmia    Aflutter  . ESRD (end stage renal disease) on dialysis ([place])    "TTS; at [facility]" (06/08/2017)  . History of blood transfusion   . Hypertension   . Peripheral vascular disease ([place])   . Pneumonia   . Rheumatoid arthritis [facility])     Past Surgical History:  Procedure Laterality Date  . A/V FISTULAGRAM Left 10/29/2017   Procedure: A/V FISTULAGRAM;  Surgeon: Elam Dutch, MD;  Location: [place] CV LAB;  Service: Cardiovascular;  Laterality: Left;  . AV FISTULA PLACEMENT Left 05/10/2017   Procedure: CREATION of Left Radicephalic Fistula;  Surgeon: Rosetta Posner, MD;  Location: [facility] OR;  Service: Vascular;  Laterality: Left;  . AV FISTULA PLACEMENT Left 07/08/2018   Procedure: BRACHIOCEPHALIC ARTERIOVENOUS (AV) FISTULA CREATION LEFT ARM;  Surgeon: Elam Dutch, MD;  Location: [place];  Service: Vascular;  Laterality:  Left;  . CARDIOVERSION N/A 08/01/2015   Procedure: CARDIOVERSION;  Surgeon: Josue Hector, MD;  Location: [facility] ENDOSCOPY;  Service: Cardiovascular;  Laterality: N/A;  . CARDIOVERSION N/A 12/18/2016   Procedure: CARDIOVERSION;  Surgeon: [name], MD;  Location: [facility] ENDOSCOPY;  Service: Cardiovascular;  Laterality: N/A;  . CARDIOVERSION N/A 04/30/2017   Procedure: CARDIOVERSION;  Surgeon: Larey Dresser, MD;  Location: [facility] ENDOSCOPY;  Service:  Cardiovascular;  Laterality: N/A;  . CHOLECYSTECTOMY    . DEBRIDMENT OF DECUBITUS ULCER N/A 06/04/2017   Procedure: DEBRIDMENT OF SACRAL DECUBITUS ULCER;  Surgeon: Georganna Skeans, MD;  Location: [place];  Service: General;  Laterality: N/A;  . INCISION AND DRAINAGE ABSCESS N/A 06/14/2017   Procedure: INCISION AND DRAINAGE GLUTEAL ABSCESS;  Surgeon: Kieth Brightly Arta Bruce, MD;  Location: [place];  Service: General;  Laterality: N/A;  Packing to coccyx with packing and drain to right buttock  . INSERTION OF DIALYSIS CATHETER Right 05/04/2017   Procedure: INSERTION OF TUNNELED  DIALYSIS CATHETER;  Surgeon: Conrad Mills River, MD;  Location: [place];  Service: Vascular;  Laterality: Right;  . IR FLUORO GUIDE CV LINE RIGHT  04/27/2017  . IR US GUIDE VASC ACCESS RIGHT  04/27/2017  . PERIPHERAL VASCULAR BALLOON ANGIOPLASTY  10/29/2017   Procedure: PERIPHERAL VASCULAR BALLOON ANGIOPLASTY;  Surgeon: Elam Dutch, MD;  Location: [place] CV LAB;  Service: Cardiovascular;;  Lt arm Fistula   . TEE WITHOUT CARDIOVERSION N/A 08/01/2015   Procedure: TRANSESOPHAGEAL ECHOCARDIOGRAM (TEE);  Surgeon: Josue Hector, MD;  Location[facility] ENDOSCOPY;  Service: Cardiovascular;  Laterality: N/A;    FAMHx:  Family History  Problem Relation Age of Onset  . Heart disease Mother   . Hyperlipidemia Father   . Hypertension Father     SOCHx:   reports that he has never smoked. He has never used smokeless tobacco. He reports that he does not drink alcohol or use drugs.  ALLERGIES:  No Known Allergies  MEDS:  Current Meds  Medication Sig  . albuterol (PROVENTIL HFA;VENTOLIN HFA) 108 (90 Base) MCG/ACT inhaler Inhale into the lungs.  [name] amiodarone (PACERONE) 200 MG tablet Take 1 tablet (200 mg total) by mouth daily.  [name] apixaban (ELIQUIS) 5 MG TABS tablet Take 1 tablet (5 mg total) by mouth 2 (two) times daily.  . calcitRIOL (ROCALTROL) 0.5 MCG capsule Take 1 capsule (0.5 mcg total) by mouth every Tuesday, Thursday, and Saturday  at 6 PM.  . carvedilol (COREG) 3.125 MG tablet Take 1 tablet (3.125 mg total) by mouth 2 (two) times daily with a meal.  . Continuous Blood Gluc Sensor (FREESTYLE LIBRE 14 DAY SENSOR) MISC USE TO TEST BLOOD SUGAR. CHANGE EVERY 14 DAYS TO A NEW SENSOR ON NEW PLACE  . diclofenac sodium (VOLTAREN) 1 % GEL Apply 4 g topically 2 (two) times daily as needed (apply over affected area). (Patient taking differently: Apply 4 g topically 2 (two) times daily as needed (for pain). )  . gabapentin (NEURONTIN) 300 MG capsule Take 300 mg by mouth daily.  . Glucose Blood (FREESTYLE LITE TEST VI)   . HYDROcodone-acetaminophen (NORCO) 5-325 MG tablet Take 1 tablet by mouth every 6 (six) hours as needed for moderate pain.  [name] insulin glargine (LANTUS) 100 UNIT/ML injection Inject 0.15 mLs (15 Units total) into the skin at bedtime.  . Lancets (FREESTYLE) lancets   . lidocaine-prilocaine (EMLA) cream APPLY SMALL AMOUNT TO ACCESS SITE (AVF) 3 TIMES A WEEK 1 HOUR BEFORE DIALYSIS. COVER WITH OCCLUSIVE DRESSING (SARAN  WRAP)  . NONFORMULARY OR COMPOUNDED ITEM [facility]:  Peripheral Neuropathy Cream -Bupivacaine 1%, Doxepin 3%, Gabapentin 6%, Pentoxifylline 3%. Topiramate 1%, apply 1-2 grams to affected area 3-4 times a day.  [name] NOVOLOG FLEXPEN 100 UNIT/ML FlexPen Inject 2-12 Units as directed 3 (three) times daily with meals. Per sliding scale  . nystatin cream (MYCOSTATIN)   . predniSONE (DELTASONE) 10 MG tablet Take 1 tablet (10 mg total) by mouth daily with breakfast. Last dose 4/25 (Patient taking differently: Take 7.5 mg by mouth daily with breakfast. )  . [DISCONTINUED] carvedilol (COREG) 3.125 MG tablet Take 1 tablet (3.125 mg total) by mouth 2 (two) times daily with a meal.  . [DISCONTINUED] carvedilol (COREG) 6.25 MG tablet Take 1 tablet (6.25 mg total) by mouth 2 (two) times daily with a meal.     ROS: Pertinent items noted in HPI and remainder of comprehensive ROS otherwise negative.  Labs/Other Tests  and Data Reviewed:    Recent Labs: 10/29/2017: BUN 37; Creatinine, Ser 7.00 07/08/2018: Hemoglobin 10.5; Potassium 4.1; Sodium 135   Recent Lipid Panel No results found for: CHOL, TRIG, HDL, CHOLHDL, LDLCALC, LDLDIRECT  Wt Readings from Last 3 Encounters:  07/08/18 300 lb (136.1 kg)  05/19/18 (!) 314 lb 4.3 oz (142.5 kg)  01/19/18 287 lb (130.2 kg)     Exam:    Vital Signs:  There were no vitals taken for this visit.   General appearance: alert, no distress and morbidly obese Lungs: No audible wheezing or visual respiratory difficulty Extremities: extremities normal, atraumatic, no cyanosis or edema Neurologic: Mental status: Alert, oriented, thought content appropriate Psych: Pleasant  ASSESSMENT & PLAN:    1. Near syncope,?bradycardia-related 2. Possible OSA 3. Paroxysmal atrial flutter status post TEE cardioversion 4. Nonischemic cardiomyopathy EF 25-35% 5. Chronic anticoagulation on Eliquis - CHADSVASC score of 5 6. Acute kidney injury 7. Type 2 diabetes 8. Dyslipidemia 9. Hypertensive heart disease with heart failure  Mr. Thelen seems to be doing well.  He just had a new fistula placed.  Fortunately his LVEF has improved up to 50 to 55%.  He feels that he is maintaining normal sinus rhythm.  Blood pressure has been running a little low and he has had some weight loss, therefore I am okay with decreasing his Coreg to 3.125 mg twice daily.  In addition to avoid long-term toxicity regarding atrial fibrillation, I recommend stopping his amiodarone.  COVID-19 Education: The signs and symptoms of COVID-19 were discussed with the patient and how to seek care for testing (follow up with PCP or arrange E-visit).  The importance of social distancing was discussed today.  Patient Risk:   After full review of this patients clinical status, I feel that they are at least moderate risk at this time.  Time:   Today, I have spent 25 minutes with the patient with telehealth technology  discussing A. fib, nonischemic cardiomyopathy, end-stage renal disease, hypertensive heart disease with heart failure.     Medication Adjustments/Labs and Tests Ordered: Current medicines are reviewed at length with the patient today.  Concerns regarding medicines are outlined above.   Tests Ordered: No orders of the defined types were placed in this encounter.   Medication Changes: Meds ordered this encounter  Medications  . DISCONTD: carvedilol (COREG) 3.125 MG tablet    Sig: Take 1 tablet (3.125 mg total) by mouth 2 (two) times daily with a meal.    Dispense:  180 tablet    Refill:  3  . carvedilol (  COREG) 3.125 MG tablet    Sig: Take 1 tablet (3.125 mg total) by mouth 2 (two) times daily with a meal.    Dispense:  180 tablet    Refill:  3    Disposition:  in 6 month(s)  Pixie Casino, MD, [facility] [place] Director of the Advanced Lipid Disorders &  Cardiovascular Risk Reduction Clinic Diplomate of the American Board of Clinical Lipidology Attending Cardiologist  Direct Dial: [phone]  Fax: [phone]  Website:  [email or web address]  Pixie Casino, MD  07/14/2018 12:33 PM

## 2018-07-22 ENCOUNTER — Other Ambulatory Visit: Payer: Self-pay

## 2018-07-22 ENCOUNTER — Ambulatory Visit: Payer: BLUE CROSS/BLUE SHIELD | Admitting: Podiatry

## 2018-07-22 VITALS — Temp 97.9°F

## 2018-07-22 DIAGNOSIS — B351 Tinea unguium: Secondary | ICD-10-CM | POA: Diagnosis not present

## 2018-07-22 DIAGNOSIS — E1142 Type 2 diabetes mellitus with diabetic polyneuropathy: Secondary | ICD-10-CM | POA: Diagnosis not present

## 2018-07-22 DIAGNOSIS — M79674 Pain in right toe(s): Secondary | ICD-10-CM

## 2018-07-22 DIAGNOSIS — L84 Corns and callosities: Secondary | ICD-10-CM

## 2018-07-22 DIAGNOSIS — M79675 Pain in left toe(s): Secondary | ICD-10-CM | POA: Diagnosis not present

## 2018-07-25 ENCOUNTER — Ambulatory Visit (INDEPENDENT_AMBULATORY_CARE_PROVIDER_SITE_OTHER): Payer: BLUE CROSS/BLUE SHIELD

## 2018-07-25 ENCOUNTER — Other Ambulatory Visit: Payer: Self-pay

## 2018-07-25 VITALS — BP 112/71 | HR 80 | Ht 71.0 in | Wt 315.6 lb

## 2018-07-25 DIAGNOSIS — R9431 Abnormal electrocardiogram [ECG] [EKG]: Secondary | ICD-10-CM | POA: Diagnosis not present

## 2018-07-25 NOTE — Patient Instructions (Addendum)
Medication Instructions:  Your physician recommends that you continue on your current medications as directed. Please refer to the Current Medication list given to you today.  If you need a refill on your cardiac medications before your next appointment, please call your pharmacy.   Lab work: NONE If you have labs (blood work) drawn today and your tests are completely normal, you will receive your results only by: [name] MyChart Message (if you have MyChart) OR . A paper copy in the mail If you have any lab test that is abnormal or we need to change your treatment, we will call you to review the results.  Testing/Procedures: NONE  Follow-Up: At [facility], you and your health needs are our priority.  As part of our continuing mission to provide you with exceptional heart care, we have created designated Provider Care Teams.  These Care Teams include your primary Cardiologist (physician) and Advanced Practice Providers (APPs -  Physician Assistants and Nurse Practitioners) who all work together to provide you with the care you need, when you need it. You will need a follow up appointment in 5 months.  Please call our office 2 months in advance to schedule this appointment.  You may see Pixie Casino, MD or one of the following Advanced Practice Providers on your designated Care Team: [place], [place] . Fabian Sharp, PA-C

## 2018-07-28 ENCOUNTER — Encounter: Payer: Self-pay | Admitting: Podiatry

## 2018-07-28 NOTE — Progress Notes (Signed)
Subjective: John Bailey presents today for preventative diabetic footcare with history of peripheral neuropathy and painful, mycotic toenails.  Pain is aggravated when wearing enclosed shoe gear and relieved with periodic professional debridement.  He relates callus on plantar aspect of his feet today.   He continues dialysis on TTS.  Patient has diabetic neuropathy managed with gabapentin.  [name], MD is his PCP.    Current Outpatient Medications:  .  albuterol (PROVENTIL HFA;VENTOLIN HFA) 108 (90 Base) MCG/ACT inhaler, Inhale into the lungs., Disp: , Rfl:  .  amiodarone (PACERONE) 200 MG tablet, Take 1 tablet (200 mg total) by mouth daily., Disp: 30 tablet, Rfl: 0 .  apixaban (ELIQUIS) 5 MG TABS tablet, Take 1 tablet (5 mg total) by mouth 2 (two) times daily., Disp: 60 tablet, Rfl: 0 .  azithromycin (ZITHROMAX) 250 MG tablet, TAKE 2 TABLETS BY MOUTH ON DAY 1, THEN TAKE 1 TABLET DAILY ON DAYS 2-5, Disp: , Rfl:  .  B Complex-C-Folic Acid (DIALYVITE PO), Take 1 tablet by mouth daily with supper., Disp: , Rfl:  .  calcitRIOL (ROCALTROL) 0.5 MCG capsule, Take 1 capsule (0.5 mcg total) by mouth every Tuesday, Thursday, and Saturday at 6 PM., Disp: 90 capsule, Rfl: 0 .  carvedilol (COREG) 3.125 MG tablet, Take 1 tablet (3.125 mg total) by mouth 2 (two) times daily with a meal., Disp: 180 tablet, Rfl: 3 .  Continuous Blood Gluc Sensor (FREESTYLE LIBRE 14 DAY SENSOR) MISC, USE TO TEST BLOOD SUGAR. CHANGE EVERY 14 DAYS TO A NEW SENSOR ON NEW PLACE, Disp: , Rfl: 0 .  diclofenac sodium (VOLTAREN) 1 % GEL, Apply 4 g topically 2 (two) times daily as needed (apply over affected area). (Patient taking differently: Apply 4 g topically 2 (two) times daily as needed (for pain). ), Disp: , Rfl:  .  docusate sodium (COLACE) 100 MG capsule, Take 100 mg by mouth daily as needed for mild constipation., Disp: , Rfl:  .  feeding supplement, GLUCERNA SHAKE, (GLUCERNA SHAKE) LIQD, Take 237 mLs by mouth 3  (three) times daily between meals., Disp: , Rfl: 0 .  gabapentin (NEURONTIN) 300 MG capsule, Take 300 mg by mouth daily., Disp: , Rfl:  .  Glucose Blood (FREESTYLE LITE TEST VI), , Disp: , Rfl:  .  HYDROcodone-acetaminophen (NORCO) 5-325 MG tablet, Take 1 tablet by mouth every 6 (six) hours as needed for moderate pain., Disp: 8 tablet, Rfl: 0 .  insulin glargine (LANTUS) 100 UNIT/ML injection, Inject 0.15 mLs (15 Units total) into the skin at bedtime., Disp: , Rfl:  .  Lancets (FREESTYLE) lancets, , Disp: , Rfl:  .  lidocaine-prilocaine (EMLA) cream, APPLY SMALL AMOUNT TO ACCESS SITE (AVF) 3 TIMES A WEEK 1 HOUR BEFORE DIALYSIS. COVER WITH OCCLUSIVE DRESSING (SARAN WRAP), Disp: , Rfl:  .  NONFORMULARY OR COMPOUNDED ITEM, [facility]:  Peripheral Neuropathy Cream -Bupivacaine 1%, Doxepin 3%, Gabapentin 6%, Pentoxifylline 3%. Topiramate 1%, apply 1-2 grams to affected area 3-4 times a day., Disp: 30 each, Rfl: 5 .  NOVOLOG FLEXPEN 100 UNIT/ML FlexPen, Inject 2-12 Units as directed 3 (three) times daily with meals. Per sliding scale, Disp: , Rfl: 0 .  nystatin cream (MYCOSTATIN), , Disp: , Rfl:  .  predniSONE (DELTASONE) 10 MG tablet, Take 1 tablet (10 mg total) by mouth daily with breakfast. Last dose 4/25 (Patient taking differently: Take 7.5 mg by mouth daily with breakfast. ), Disp: , Rfl:  .  SURE COMFORT PEN NEEDLES 31G X 5  MM MISC, USE AS DIRECTED FOR insulin, Disp: , Rfl:   No Known Allergies  Objective: Vitals:   07/22/18 1046  Temp: 97.9 F (36.6 C)   Vascular Examination: Capillary refill time immediate x 10 digits.  Dorsalis pedis 2/4 b/l.   Posterior tibial pulses 1/4 b/l.  Digital hair x 10 digits was absent.  Skin temperature gradient WNL b/l.  Dermatological Examination: Skin with normal turgor, texture and tone b/l.  Toenails 1-5 b/l discolored, thick, dystrophic with subungual debris and pain with palpation to nailbeds due to thickness of  nails.  Hyperkeratotic lesion submet head 5 b/l with tenderness to palpation. No edema, no erythema, no drainage, no flocculence.  Musculoskeletal: Muscle strength 5/5 to all muscle groups b/l.  Neurological: Sensation with 10 gram monofilament is intact b/l.  Vibratory sensation absent b/l.  Assessment: 1. Painful onychomycosis toenails 1-5 b/l 2. Calluses submet head 5 b/l 3. NIDDM with neuropathy  Plan: 1. Toenails 1-5 b/l were debrided in length and girth without iatrogenic bleeding. Calluses pared submetatarsal head(s) 5 b/l  utilizing sterile scalpel blade without incident. 2. Patient to continue soft, supportive shoe gear daily. 3. Patient to report any pedal injuries to medical professional  4. Follow up 3 months.  5. Patient/POA to call should there be a concern in the interim.

## 2018-08-15 ENCOUNTER — Other Ambulatory Visit: Payer: Self-pay

## 2018-08-15 DIAGNOSIS — N186 End stage renal disease: Secondary | ICD-10-CM

## 2018-08-15 DIAGNOSIS — Z992 Dependence on renal dialysis: Secondary | ICD-10-CM

## 2018-08-17 ENCOUNTER — Telehealth (HOSPITAL_COMMUNITY): Payer: Self-pay | Admitting: Rehabilitation

## 2018-08-18 ENCOUNTER — Encounter (HOSPITAL_COMMUNITY): Payer: BLUE CROSS/BLUE SHIELD

## 2018-08-18 ENCOUNTER — Encounter: Payer: BLUE CROSS/BLUE SHIELD | Admitting: Vascular Surgery

## 2018-08-24 ENCOUNTER — Telehealth (HOSPITAL_COMMUNITY): Payer: Self-pay | Admitting: Rehabilitation

## 2018-08-24 NOTE — Telephone Encounter (Signed)

## 2018-08-25 ENCOUNTER — Ambulatory Visit (HOSPITAL_COMMUNITY)
Admission: RE | Admit: 2018-08-25 | Discharge: 2018-08-25 | Disposition: A | Discharge status: Home / Self Care | Payer: BC Managed Care – PPO | Source: Ambulatory Visit | Attending: Vascular Surgery | Admitting: Vascular Surgery

## 2018-08-25 ENCOUNTER — Ambulatory Visit (INDEPENDENT_AMBULATORY_CARE_PROVIDER_SITE_OTHER): Payer: BC Managed Care – PPO | Admitting: Vascular Surgery

## 2018-08-25 ENCOUNTER — Other Ambulatory Visit: Payer: Self-pay

## 2018-08-25 ENCOUNTER — Encounter: Payer: Self-pay | Admitting: Vascular Surgery

## 2018-08-25 VITALS — BP 139/91 | HR 86 | Temp 98.0°F | Resp 20 | Ht 71.0 in | Wt 313.2 lb

## 2018-08-25 DIAGNOSIS — Z992 Dependence on renal dialysis: Secondary | ICD-10-CM | POA: Diagnosis present

## 2018-08-25 DIAGNOSIS — N186 End stage renal disease: Secondary | ICD-10-CM | POA: Diagnosis present

## 2018-08-25 DIAGNOSIS — I83813 Varicose veins of bilateral lower extremities with pain: Secondary | ICD-10-CM

## 2018-08-26 NOTE — Progress Notes (Signed)
Patient is a 48 year old male who returns for follow-up today.  He underwent placement of a left brachiocephalic AV fistula on July 08, 2018.  He denies any numbness or tingling in his hand.  He also complains today of bilateral lower extremity leg swelling and occasional pain.  He does not really describe claudication symptoms in his lower extremities.  Dates this is been chronic for several years.  He has no ulcers or open wounds on his legs.  Physical exam:  Vitals:   08/25/18 0838  BP: (!) 139/91  Pulse: 86  Resp: 20  Temp: 98 F (36.7 C)  SpO2: 93%  Weight: (!) 313 lb 3.2 oz (142.1 kg)  Height: 5\' 11"  (1.803 m)    Left upper extremity palpable thrill fistula is easily palpable over the proximal half.  Data: Patient had a duplex ultrasound today which shows that the fistula is 6 mm in diameter it is slightly deeper at the shoulder level but otherwise less than 6 mm from the skin and the proximal 1/2-2/3.  Assessment: Maturing fistula left arm.  This should be ready for cannulation towards the end of July.  2.  Bilateral lower extremity leg swelling and pain.  The patient was given a prescription today for lower extremity compression stockings.  He will follow-up in our PA clinic in 2 to 4 weeks for further evaluation with lower extremity duplex for reflux.  Ruta Hinds, MD [facility] Office: [phone] Pager: [phone]

## 2018-09-21 ENCOUNTER — Other Ambulatory Visit: Payer: Self-pay

## 2018-09-21 ENCOUNTER — Telehealth (HOSPITAL_COMMUNITY): Payer: Self-pay

## 2018-09-21 DIAGNOSIS — R600 Localized edema: Secondary | ICD-10-CM

## 2018-09-21 DIAGNOSIS — N186 End stage renal disease: Secondary | ICD-10-CM

## 2018-09-21 DIAGNOSIS — Z992 Dependence on renal dialysis: Secondary | ICD-10-CM

## 2018-09-21 NOTE — Telephone Encounter (Signed)
The above patient or their representative was contacted and gave the following answers to these questions:         Do you have any of the following symptoms? No  Fever                    Cough                   Shortness of breath  Do  you have any of the following other symptoms?    muscle pain         vomiting,        diarrhea        rash         weakness        red eye        abdominal pain         bruising          bruising or bleeding              joint pain           severe headache    Have you been in contact with someone who was or has been sick in the past 2 weeks? No  Yes                 Unsure                         Unable to assess   Does the person that you were in contact with have any of the following symptoms?   Cough         shortness of breath           muscle pain         vomiting,            diarrhea            rash            weakness           fever            red eye           abdominal pain           bruising  or  bleeding                joint pain                severe headache               Have you  or someone you have been in contact with traveled internationally in the last month?  No        If yes, which countries?   Have you  or someone you have been in contact with traveled outside [place] in th last month?         If yes, which state and city?   COMMENTS OR ACTION PLAN FOR THIS PATIENT:

## 2018-09-22 ENCOUNTER — Ambulatory Visit: Payer: BC Managed Care – PPO | Admitting: Family

## 2018-09-22 ENCOUNTER — Encounter: Payer: Self-pay | Admitting: Family

## 2018-09-22 ENCOUNTER — Inpatient Hospital Stay (HOSPITAL_COMMUNITY): Admission: RE | Admit: 2018-09-22 | Payer: BC Managed Care – PPO | Source: Ambulatory Visit

## 2018-10-28 ENCOUNTER — Ambulatory Visit: Payer: BC Managed Care – PPO | Admitting: Podiatry

## 2018-12-06 ENCOUNTER — Other Ambulatory Visit: Payer: Self-pay

## 2018-12-06 DIAGNOSIS — N186 End stage renal disease: Secondary | ICD-10-CM

## 2018-12-06 DIAGNOSIS — Z992 Dependence on renal dialysis: Secondary | ICD-10-CM

## 2018-12-08 ENCOUNTER — Ambulatory Visit: Payer: BC Managed Care – PPO | Admitting: Vascular Surgery

## 2018-12-08 ENCOUNTER — Encounter: Payer: Self-pay | Admitting: Family

## 2018-12-08 ENCOUNTER — Encounter (HOSPITAL_COMMUNITY): Payer: BC Managed Care – PPO

## 2019-01-25 ENCOUNTER — Telehealth (HOSPITAL_COMMUNITY): Payer: Self-pay

## 2019-01-25 NOTE — Telephone Encounter (Signed)

## 2019-01-26 ENCOUNTER — Telehealth: Payer: Self-pay | Admitting: *Deleted

## 2019-01-26 ENCOUNTER — Other Ambulatory Visit: Payer: Self-pay | Admitting: *Deleted

## 2019-01-26 ENCOUNTER — Ambulatory Visit (INDEPENDENT_AMBULATORY_CARE_PROVIDER_SITE_OTHER): Payer: BC Managed Care – PPO | Admitting: Vascular Surgery

## 2019-01-26 ENCOUNTER — Encounter: Payer: Self-pay | Admitting: Vascular Surgery

## 2019-01-26 ENCOUNTER — Encounter: Payer: Self-pay | Admitting: *Deleted

## 2019-01-26 ENCOUNTER — Other Ambulatory Visit: Payer: Self-pay

## 2019-01-26 ENCOUNTER — Ambulatory Visit (HOSPITAL_COMMUNITY)
Admission: RE | Admit: 2019-01-26 | Discharge: 2019-01-26 | Disposition: A | Discharge status: Home / Self Care | Payer: BC Managed Care – PPO | Source: Ambulatory Visit | Attending: Vascular Surgery | Admitting: Vascular Surgery

## 2019-01-26 VITALS — BP 143/96 | HR 88 | Temp 97.4°F | Resp 20 | Ht 71.0 in | Wt 319.3 lb

## 2019-01-26 DIAGNOSIS — N186 End stage renal disease: Secondary | ICD-10-CM | POA: Diagnosis present

## 2019-01-26 DIAGNOSIS — Z992 Dependence on renal dialysis: Secondary | ICD-10-CM | POA: Diagnosis present

## 2019-01-26 NOTE — Telephone Encounter (Signed)
Request for Surgical Clearance  1. What type of surgery is being performed?  REVISION ARTERIOVENOUS FISTULA    2. When is this surgery scheduled? 02/14/2019  3. What type of clearance is required (medical clearance vs. Pharmacy clearance to hold med vs. Both)? PHARMACY    4. Are there any medications that need to be held prior to surgery and how long?  ELIQUIS HOLD X 48 HOUR PRE-OP    5. Practice name and name of physician performing surgery?  DR. Juanda Crumble [facility].      6.  What is your office phone number? [phone]    7. What is your office fax number? (Be sure to include anyone who it needs to go Attn to) [place]    8. Anesthesia type (None, local, MAC, general)? CHOICE   REMINDER TO USER: Remember to please route this message to P CV DIV PREOP in a phone note.

## 2019-01-26 NOTE — Telephone Encounter (Signed)
Patient with diagnosis of atrial fibrillation on eliquis for anticoagulation.    Procedure: revision arteriovenous fistula Date of procedure: 02/14/2019  CHADS2-VASc score of  4 (CHF, HTN, DM2, CAD)  *NO history of  VTE/stroke noted*  CrCl = ESRD Platelet count 288 (07/03/17)  Per office protocol, patient can hold eliquis for 48 hours, as requested, pre-procedure. May resume per procedure MD orders

## 2019-01-26 NOTE — Progress Notes (Signed)
Patient name: John Bailey MRN: [id number] DOB: 06/12/1970 Sex: male  HPI: CAMAREON VILLACRES is a 48 y.o. male, who is status post placement of a left brachiocephalic AV fistula April 2020.  He previously had a failed left radiocephalic AV fistula in February 2019.  He was sent today for further evaluation after recent infiltration to consider Superficialization of the AV fistula.  He currently is dialyzing via catheter.  His dialysis today is Monday Wednesday Friday.  He is on Eliquis for atrial fibrillation.  He has no numbness or tingling in his hand.  Other medical problems include diabetes hypertension anemia all of which have been stable.  Past Medical History:  Diagnosis Date  . Anemia   . Atrial flutter with rapid ventricular response ([place]) 08/06/2015   a. s/p DCCV in 07/2015 with recurrent atrial fibrillation and DCCV in 12/2016. Initially successful but noted to be back in atrial fibrillation within 2 weeks of DCCV.   [name] Chronic diastolic (congestive) heart failure (HCC)    a. EF 50-55% by echo in 06/2016.  . Diabetes ([place])    Type II  . Dysrhythmia    Aflutter  . ESRD (end stage renal disease) on dialysis ([place])    "TTS; at [facility]" (06/08/2017)  . History of blood transfusion   . Hypertension   . Peripheral vascular disease ([place])   . Pneumonia   . Rheumatoid arthritis [facility])    Past Surgical History:  Procedure Laterality Date  . A/V FISTULAGRAM Left 10/29/2017   Procedure: A/V FISTULAGRAM;  Surgeon: Elam Dutch, MD;  Location: [place] CV LAB;  Service: Cardiovascular;  Laterality: Left;  . AV FISTULA PLACEMENT Left 05/10/2017   Procedure: CREATION of Left Radicephalic Fistula;  Surgeon: Rosetta Posner, MD;  Location: [facility] OR;  Service: Vascular;  Laterality: Left;  . AV FISTULA PLACEMENT Left 07/08/2018   Procedure: BRACHIOCEPHALIC ARTERIOVENOUS (AV) FISTULA CREATION LEFT ARM;  Surgeon: Elam Dutch, MD;  Location: [place];  Service: Vascular;  Laterality: Left;  .  CARDIOVERSION N/A 08/01/2015   Procedure: CARDIOVERSION;  Surgeon: Josue Hector, MD;  Location: [facility] ENDOSCOPY;  Service: Cardiovascular;  Laterality: N/A;  . CARDIOVERSION N/A 12/18/2016   Procedure: CARDIOVERSION;  Surgeon: [name], MD;  Location: [facility] ENDOSCOPY;  Service: Cardiovascular;  Laterality: N/A;  . CARDIOVERSION N/A 04/30/2017   Procedure: CARDIOVERSION;  Surgeon: Larey Dresser, MD;  Location: [facility] ENDOSCOPY;  Service: Cardiovascular;  Laterality: N/A;  . CHOLECYSTECTOMY    . DEBRIDMENT OF DECUBITUS ULCER N/A 06/04/2017   Procedure: DEBRIDMENT OF SACRAL DECUBITUS ULCER;  Surgeon: Georganna Skeans, MD;  Location: [place];  Service: General;  Laterality: N/A;  . INCISION AND DRAINAGE ABSCESS N/A 06/14/2017   Procedure: INCISION AND DRAINAGE GLUTEAL ABSCESS;  Surgeon: Kieth Brightly Arta Bruce, MD;  Location: [place];  Service: General;  Laterality: N/A;  Packing to coccyx with packing and drain to right buttock  . INSERTION OF DIALYSIS CATHETER Right 05/04/2017   Procedure: INSERTION OF TUNNELED  DIALYSIS CATHETER;  Surgeon: Conrad [place], MD;  Location: [place];  Service: Vascular;  Laterality: Right;  . IR FLUORO GUIDE CV LINE RIGHT  04/27/2017  . IR US GUIDE VASC ACCESS RIGHT  04/27/2017  . PERIPHERAL VASCULAR BALLOON ANGIOPLASTY  10/29/2017   Procedure: PERIPHERAL VASCULAR BALLOON ANGIOPLASTY;  Surgeon: Elam Dutch, MD;  Location: [place] CV LAB;  Service: Cardiovascular;;  Lt arm Fistula   . TEE WITHOUT CARDIOVERSION N/A 08/01/2015   Procedure:  TRANSESOPHAGEAL ECHOCARDIOGRAM (TEE);  Surgeon: Josue Hector, MD;  Location: [facility] ENDOSCOPY;  Service: Cardiovascular;  Laterality: N/A;    Family History  Problem Relation Age of Onset  . Heart disease Mother   . Hyperlipidemia Father   . Hypertension Father     SOCIAL HISTORY: Social History   Socioeconomic History  . Marital status: Married    Spouse name: Not on file  . Number of children: Not on file  . Years of education:  Not on file  . Highest education level: Not on file  Occupational History  . Not on file  Social Needs  . Financial resource strain: Not on file  . Food insecurity    Worry: Not on file    Inability: Not on file  . Transportation needs    Medical: Not on file    Non-medical: Not on file  Tobacco Use  . Smoking status: Never Smoker  . Smokeless tobacco: Never Used  Substance and Sexual Activity  . Alcohol use: No  . Drug use: No  . Sexual activity: Yes  Lifestyle  . Physical activity    Days per week: Not on file    Minutes per session: Not on file  . Stress: Not on file  Relationships  . Social Herbalist on phone: Not on file    Gets together: Not on file    Attends religious service: Not on file    Active member of club or organization: Not on file    Attends meetings of clubs or organizations: Not on file    Relationship status: Not on file  . Intimate partner violence    Fear of current or ex partner: Not on file    Emotionally abused: Not on file    Physically abused: Not on file    Forced sexual activity: Not on file  Other Topics Concern  . Not on file  Social History Narrative  . Not on file    No Known Allergies  Current Outpatient Medications  Medication Sig Dispense Refill  . albuterol (PROVENTIL HFA;VENTOLIN HFA) 108 (90 Base) MCG/ACT inhaler Inhale into the lungs.    [name] amiodarone (PACERONE) 200 MG tablet Take 1 tablet (200 mg total) by mouth daily. 30 tablet 0  . apixaban (ELIQUIS) 5 MG TABS tablet Take 1 tablet (5 mg total) by mouth 2 (two) times daily. 60 tablet 0  . azithromycin (ZITHROMAX) 250 MG tablet TAKE 2 TABLETS BY MOUTH ON DAY 1, THEN TAKE 1 TABLET DAILY ON DAYS 2-5    . B Complex-C-Folic Acid (DIALYVITE PO) Take 1 tablet by mouth daily with supper.    . calcitRIOL (ROCALTROL) 0.5 MCG capsule Take 1 capsule (0.5 mcg total) by mouth every Tuesday, Thursday, and Saturday at 6 PM. 90 capsule 0  . carvedilol (COREG) 3.125 MG tablet  Take 1 tablet (3.125 mg total) by mouth 2 (two) times daily with a meal. 180 tablet 3  . Continuous Blood Gluc Sensor (FREESTYLE LIBRE 14 DAY SENSOR) MISC USE TO TEST BLOOD SUGAR. CHANGE EVERY 14 DAYS TO A NEW SENSOR ON NEW PLACE  0  . diclofenac sodium (VOLTAREN) 1 % GEL Apply 4 g topically 2 (two) times daily as needed (apply over affected area). (Patient taking differently: Apply 4 g topically 2 (two) times daily as needed (for pain). )    . docusate sodium (COLACE) 100 MG capsule Take 100 mg by mouth daily as needed for mild constipation.    [name]  feeding supplement, GLUCERNA SHAKE, (GLUCERNA SHAKE) LIQD Take 237 mLs by mouth 3 (three) times daily between meals.  0  . ferric citrate (AURYXIA) 1 GM 210 MG(Fe) tablet Take by mouth.    . gabapentin (NEURONTIN) 300 MG capsule Take 300 mg by mouth daily.    . Glucose Blood (FREESTYLE LITE TEST VI)     . HYDROcodone-acetaminophen (NORCO) 5-325 MG tablet Take 1 tablet by mouth every 6 (six) hours as needed for moderate pain. 8 tablet 0  . insulin glargine (LANTUS) 100 UNIT/ML injection Inject 0.15 mLs (15 Units total) into the skin at bedtime.    . Lancets (FREESTYLE) lancets     . lidocaine-prilocaine (EMLA) cream APPLY SMALL AMOUNT TO ACCESS SITE (AVF) 3 TIMES A WEEK 1 HOUR BEFORE DIALYSIS. COVER WITH OCCLUSIVE DRESSING (SARAN WRAP)    . NONFORMULARY OR COMPOUNDED ITEM [facility]:  Peripheral Neuropathy Cream -Bupivacaine 1%, Doxepin 3%, Gabapentin 6%, Pentoxifylline 3%. Topiramate 1%, apply 1-2 grams to affected area 3-4 times a day. 30 each 5  . NOVOLOG FLEXPEN 100 UNIT/ML FlexPen Inject 2-12 Units as directed 3 (three) times daily with meals. Per sliding scale  0  . nystatin cream (MYCOSTATIN)     . predniSONE (DELTASONE) 10 MG tablet Take 1 tablet (10 mg total) by mouth daily with breakfast. Last dose 4/25 (Patient taking differently: Take 7.5 mg by mouth daily with breakfast. )    . rOPINIRole (REQUIP) 0.25 MG tablet Take 1-2 tablet by mouth  at bedtime as needed prn before dialysis    . SENSIPAR 60 MG tablet Take 60 mg by mouth at bedtime.    . sevelamer carbonate (RENVELA) 2.4 g PACK Take by mouth.    . SURE COMFORT PEN NEEDLES 31G X 5 MM MISC USE AS DIRECTED FOR insulin    . terbinafine (LAMISIL AT) 1 % cream Apply topically.    . triamcinolone (KENALOG) 0.025 % cream Apply topically.     No current facility-administered medications for this visit.     ROS:   General:  No weight loss, Fever, chills  Cardiac: No recent episodes of chest pain/pressure, no shortness of breath at rest.  No shortness of breath with exertion.  Denies history of atrial fibrillation or irregular heartbeat  Pulmonary: No home oxygen, no productive cough, no hemoptysis,  No asthma or wheezing    Physical Examination  Vitals:   01/26/19 1326  BP: (!) 143/96  Pulse: 88  Resp: 20  Temp: (!) 97.4 F (36.3 C)  SpO2: 97%  Weight: (!) 319 lb 4.8 oz (144.8 kg)  Height: 5\' 11"  (1.803 m)    Body mass index is 44.53 kg/m.  General:  Alert and oriented, no acute distress HEENT: Normal Neck: No JVD Cardiac: Regular Rate and Rhythm Skin: No rash Extremity Pulses: Palpable thrill left upper arm AV fistula.  However, it is slightly deep.,  The fistula is palpable throughout its course. Musculoskeletal: No deformity or edema  Neurologic: Upper and lower extremity motor 5/5 and symmetric  DATA:  Patient had duplex ultrasound of his AV fistula today.  This shows the fistula diameter is 6 to 7 mm.  No significant narrowing.  Multiple side branches.  It is approximately 6 mm in depth.  ASSESSMENT: Left arm AV fistula apparently difficult cannulation secondary to depth   PLAN: Superficialization left arm AV fistula with sidebranch ligation scheduled for February 14, 2019.  We will stop his Eliquis periprocedure.  Most likely will need general anesthesia due to  extent of procedure.   Ruta Hinds, MD [facility] [place]  Office: [phone] Pager: [phone]

## 2019-01-26 NOTE — Telephone Encounter (Signed)
   Primary Cardiologist: Pixie Casino, MD  Chart reviewed as part of pre-operative protocol coverage. Per pharmacy recommendations, patient can hold eliquis 2 days prior to his upcoming AV fistula revision. Eliquis should be restarted as soon as he is cleared to do so by Dr. Oneida Alar.  I will route this recommendation to the requesting party via Epic fax function and remove from pre-op pool.  Please call with questions.  Abigail Butts, PA-C 01/26/2019, 4:23 PM

## 2019-01-26 NOTE — H&P (View-Only) (Signed)
Patient name: John Bailey MRN: [id number] DOB: [birth year] Sex: male  HPI: John Bailey is a 48 y.o. male, who is status post placement of a left brachiocephalic AV fistula April 2020.  He previously had a failed left radiocephalic AV fistula in February 2019.  He was sent today for further evaluation after recent infiltration to consider Superficialization of the AV fistula.  He currently is dialyzing via catheter.  His dialysis today is Monday Wednesday Friday.  He is on Eliquis for atrial fibrillation.  He has no numbness or tingling in his hand.  Other medical problems include diabetes hypertension anemia all of which have been stable.  Past Medical History:  Diagnosis Date  . Anemia   . Atrial flutter with rapid ventricular response ([place]) 08/06/2015   a. s/p DCCV in 07/2015 with recurrent atrial fibrillation and DCCV in 12/2016. Initially successful but noted to be back in atrial fibrillation within 2 weeks of DCCV.   John Bailey Chronic diastolic (congestive) heart failure (HCC)    a. EF 50-55% by echo in 06/2016.  . Diabetes ([place])    Type II  . Dysrhythmia    Aflutter  . ESRD (end stage renal disease) on dialysis ([place])    "TTS; at [facility]" (06/08/2017)  . History of blood transfusion   . Hypertension   . Peripheral vascular disease ([place])   . Pneumonia   . Rheumatoid arthritis [facility])    Past Surgical History:  Procedure Laterality Date  . A/V FISTULAGRAM Left 10/29/2017   Procedure: A/V FISTULAGRAM;  Surgeon: Elam Dutch, MD;  Location: [place] CV LAB;  Service: Cardiovascular;  Laterality: Left;  . AV FISTULA PLACEMENT Left 05/10/2017   Procedure: CREATION of Left Radicephalic Fistula;  Surgeon: Rosetta Posner, MD;  Location: [facility] OR;  Service: Vascular;  Laterality: Left;  . AV FISTULA PLACEMENT Left 07/08/2018   Procedure: BRACHIOCEPHALIC ARTERIOVENOUS (AV) FISTULA CREATION LEFT ARM;  Surgeon: Elam Dutch, MD;  Location: [place];  Service: Vascular;  Laterality: Left;  .  CARDIOVERSION N/A 08/01/2015   Procedure: CARDIOVERSION;  Surgeon: Josue Hector, MD;  Location: [facility] ENDOSCOPY;  Service: Cardiovascular;  Laterality: N/A;  . CARDIOVERSION N/A 12/18/2016   Procedure: CARDIOVERSION;  Surgeon: [name], MD;  Location: [facility] ENDOSCOPY;  Service: Cardiovascular;  Laterality: N/A;  . CARDIOVERSION N/A 04/30/2017   Procedure: CARDIOVERSION;  Surgeon: Larey Dresser, MD;  Location: [facility] ENDOSCOPY;  Service: Cardiovascular;  Laterality: N/A;  . CHOLECYSTECTOMY    . DEBRIDMENT OF DECUBITUS ULCER N/A 06/04/2017   Procedure: DEBRIDMENT OF SACRAL DECUBITUS ULCER;  Surgeon: Georganna Skeans, MD;  Location: [place];  Service: General;  Laterality: N/A;  . INCISION AND DRAINAGE ABSCESS N/A 06/14/2017   Procedure: INCISION AND DRAINAGE GLUTEAL ABSCESS;  Surgeon: Kieth Brightly Arta Bruce, MD;  Location: [place];  Service: General;  Laterality: N/A;  Packing to coccyx with packing and drain to right buttock  . INSERTION OF DIALYSIS CATHETER Right 05/04/2017   Procedure: INSERTION OF TUNNELED  DIALYSIS CATHETER;  Surgeon: Conrad [place], MD;  Location: [place];  Service: Vascular;  Laterality: Right;  . IR FLUORO GUIDE CV LINE RIGHT  04/27/2017  . IR US GUIDE VASC ACCESS RIGHT  04/27/2017  . PERIPHERAL VASCULAR BALLOON ANGIOPLASTY  10/29/2017   Procedure: PERIPHERAL VASCULAR BALLOON ANGIOPLASTY;  Surgeon: Elam Dutch, MD;  Location: [place] CV LAB;  Service: Cardiovascular;;  Lt arm Fistula   . TEE WITHOUT CARDIOVERSION N/A 08/01/2015   Procedure:  TRANSESOPHAGEAL ECHOCARDIOGRAM (TEE);  Surgeon: Josue Hector, MD;  Location: [facility] ENDOSCOPY;  Service: Cardiovascular;  Laterality: N/A;    Family History  Problem Relation Age of Onset  . Heart disease Mother   . Hyperlipidemia Father   . Hypertension Father     SOCIAL HISTORY: Social History   Socioeconomic History  . Marital status: Married    Spouse name: Not on file  . Number of children: Not on file  . Years of education:  Not on file  . Highest education level: Not on file  Occupational History  . Not on file  Social Needs  . Financial resource strain: Not on file  . Food insecurity    Worry: Not on file    Inability: Not on file  . Transportation needs    Medical: Not on file    Non-medical: Not on file  Tobacco Use  . Smoking status: Never Smoker  . Smokeless tobacco: Never Used  Substance and Sexual Activity  . Alcohol use: No  . Drug use: No  . Sexual activity: Yes  Lifestyle  . Physical activity    Days per week: Not on file    Minutes per session: Not on file  . Stress: Not on file  Relationships  . Social Herbalist on phone: Not on file    Gets together: Not on file    Attends religious service: Not on file    Active member of club or organization: Not on file    Attends meetings of clubs or organizations: Not on file    Relationship status: Not on file  . Intimate partner violence    Fear of current or ex partner: Not on file    Emotionally abused: Not on file    Physically abused: Not on file    Forced sexual activity: Not on file  Other Topics Concern  . Not on file  Social History Narrative  . Not on file    No Known Allergies  Current Outpatient Medications  Medication Sig Dispense Refill  . albuterol (PROVENTIL HFA;VENTOLIN HFA) 108 (90 Base) MCG/ACT inhaler Inhale into the lungs.    John Bailey amiodarone (PACERONE) 200 MG tablet Take 1 tablet (200 mg total) by mouth daily. 30 tablet 0  . apixaban (ELIQUIS) 5 MG TABS tablet Take 1 tablet (5 mg total) by mouth 2 (two) times daily. 60 tablet 0  . azithromycin (ZITHROMAX) 250 MG tablet TAKE 2 TABLETS BY MOUTH ON DAY 1, THEN TAKE 1 TABLET DAILY ON DAYS 2-5    . B Complex-C-Folic Acid (DIALYVITE PO) Take 1 tablet by mouth daily with supper.    . calcitRIOL (ROCALTROL) 0.5 MCG capsule Take 1 capsule (0.5 mcg total) by mouth every Tuesday, Thursday, and Saturday at 6 PM. 90 capsule 0  . carvedilol (COREG) 3.125 MG tablet  Take 1 tablet (3.125 mg total) by mouth 2 (two) times daily with a meal. 180 tablet 3  . Continuous Blood Gluc Sensor (FREESTYLE LIBRE 14 DAY SENSOR) MISC USE TO TEST BLOOD SUGAR. CHANGE EVERY 14 DAYS TO A NEW SENSOR ON NEW PLACE  0  . diclofenac sodium (VOLTAREN) 1 % GEL Apply 4 g topically 2 (two) times daily as needed (apply over affected area). (Patient taking differently: Apply 4 g topically 2 (two) times daily as needed (for pain). )    . docusate sodium (COLACE) 100 MG capsule Take 100 mg by mouth daily as needed for mild constipation.    John Bailey  feeding supplement, GLUCERNA SHAKE, (GLUCERNA SHAKE) LIQD Take 237 mLs by mouth 3 (three) times daily between meals.  0  . ferric citrate (AURYXIA) 1 GM 210 MG(Fe) tablet Take by mouth.    . gabapentin (NEURONTIN) 300 MG capsule Take 300 mg by mouth daily.    . Glucose Blood (FREESTYLE LITE TEST VI)     . HYDROcodone-acetaminophen (NORCO) 5-325 MG tablet Take 1 tablet by mouth every 6 (six) hours as needed for moderate pain. 8 tablet 0  . insulin glargine (LANTUS) 100 UNIT/ML injection Inject 0.15 mLs (15 Units total) into the skin at bedtime.    . Lancets (FREESTYLE) lancets     . lidocaine-prilocaine (EMLA) cream APPLY SMALL AMOUNT TO ACCESS SITE (AVF) 3 TIMES A WEEK 1 HOUR BEFORE DIALYSIS. COVER WITH OCCLUSIVE DRESSING (SARAN WRAP)    . NONFORMULARY OR COMPOUNDED ITEM [facility]:  Peripheral Neuropathy Cream -Bupivacaine 1%, Doxepin 3%, Gabapentin 6%, Pentoxifylline 3%. Topiramate 1%, apply 1-2 grams to affected area 3-4 times a day. 30 each 5  . NOVOLOG FLEXPEN 100 UNIT/ML FlexPen Inject 2-12 Units as directed 3 (three) times daily with meals. Per sliding scale  0  . nystatin cream (MYCOSTATIN)     . predniSONE (DELTASONE) 10 MG tablet Take 1 tablet (10 mg total) by mouth daily with breakfast. Last dose 4/25 (Patient taking differently: Take 7.5 mg by mouth daily with breakfast. )    . rOPINIRole (REQUIP) 0.25 MG tablet Take 1-2 tablet by mouth  at bedtime as needed prn before dialysis    . SENSIPAR 60 MG tablet Take 60 mg by mouth at bedtime.    . sevelamer carbonate (RENVELA) 2.4 g PACK Take by mouth.    . SURE COMFORT PEN NEEDLES 31G X 5 MM MISC USE AS DIRECTED FOR insulin    . terbinafine (LAMISIL AT) 1 % cream Apply topically.    . triamcinolone (KENALOG) 0.025 % cream Apply topically.     No current facility-administered medications for this visit.     ROS:   General:  No weight loss, Fever, chills  Cardiac: No recent episodes of chest pain/pressure, no shortness of breath at rest.  No shortness of breath with exertion.  Denies history of atrial fibrillation or irregular heartbeat  Pulmonary: No home oxygen, no productive cough, no hemoptysis,  No asthma or wheezing    Physical Examination  Vitals:   01/26/19 1326  BP: (!) 143/96  Pulse: 88  Resp: 20  Temp: (!) 97.4 F (36.3 C)  SpO2: 97%  Weight: (!) 319 lb 4.8 oz (144.8 kg)  Height: 5\' 11"  (1.803 m)    Body mass index is 44.53 kg/m.  General:  Alert and oriented, no acute distress HEENT: Normal Neck: No JVD Cardiac: Regular Rate and Rhythm Skin: No rash Extremity Pulses: Palpable thrill left upper arm AV fistula.  However, it is slightly deep.,  The fistula is palpable throughout its course. Musculoskeletal: No deformity or edema  Neurologic: Upper and lower extremity motor 5/5 and symmetric  DATA:  Patient had duplex ultrasound of his AV fistula today.  This shows the fistula diameter is 6 to 7 mm.  No significant narrowing.  Multiple side branches.  It is approximately 6 mm in depth.  ASSESSMENT: Left arm AV fistula apparently difficult cannulation secondary to depth   PLAN: Superficialization left arm AV fistula with sidebranch ligation scheduled for February 14, 2019.  We will stop his Eliquis periprocedure.  Most likely will need general anesthesia due to  extent of procedure.   Ruta Hinds, MD [facility] [place]  Office: [phone] Pager: [phone]

## 2019-01-26 NOTE — Progress Notes (Signed)
Request for Surgical Clearance  1. What type of surgery is being performed?  REVISION OF FISTULA    2. When is this surgery scheduled? 02/14/2019  3. What type of clearance is required (medical clearance vs. Pharmacy clearance to hold med vs. Both)? PHARMACY CLEARANCE    4. Are there any medications that need to be held prior to surgery and how long?  ELIQUIS FOR 48 HOURS PRE-OP    5. Practice name and name of physician performing surgery?   DR. Oneida Alar @ [facility].    6.  What is your office phone number? [phone]    7. What is your office fax number? (Be sure to include anyone who it needs to go Attn to) [phone] ATTN. [email or web address]    8. Anesthesia type (None, local, MAC, general)? CHOICE   REMINDER TO USER: Remember to please route this message to P CV DIV PREOP in a phone note.

## 2019-01-31 ENCOUNTER — Other Ambulatory Visit: Payer: Self-pay

## 2019-01-31 DIAGNOSIS — N186 End stage renal disease: Secondary | ICD-10-CM

## 2019-01-31 DIAGNOSIS — Z992 Dependence on renal dialysis: Secondary | ICD-10-CM

## 2019-01-31 DIAGNOSIS — R600 Localized edema: Secondary | ICD-10-CM

## 2019-02-08 ENCOUNTER — Ambulatory Visit: Payer: BC Managed Care – PPO

## 2019-02-08 ENCOUNTER — Encounter (HOSPITAL_COMMUNITY): Payer: BC Managed Care – PPO

## 2019-02-10 ENCOUNTER — Encounter (HOSPITAL_COMMUNITY): Payer: Self-pay

## 2019-02-11 ENCOUNTER — Other Ambulatory Visit (HOSPITAL_COMMUNITY)
Admission: RE | Admit: 2019-02-11 | Discharge: 2019-02-11 | Disposition: A | Discharge status: Home / Self Care | Payer: BC Managed Care – PPO | Source: Ambulatory Visit | Attending: Vascular Surgery | Admitting: Vascular Surgery

## 2019-02-11 DIAGNOSIS — Z20828 Contact with and (suspected) exposure to other viral communicable diseases: Secondary | ICD-10-CM | POA: Diagnosis not present

## 2019-02-11 DIAGNOSIS — Z01812 Encounter for preprocedural laboratory examination: Secondary | ICD-10-CM | POA: Insufficient documentation

## 2019-02-11 LAB — SARS CORONAVIRUS 2 (TAT 6-24 HRS): SARS Coronavirus 2: NEGATIVE

## 2019-02-13 MED ORDER — DEXTROSE 5 % IV SOLN
3.0000 g | INTRAVENOUS | Status: AC
  Administered 2019-02-14: 3 g via INTRAVENOUS
  Filled 2019-02-13: qty 3

## 2019-02-13 NOTE — Anesthesia Preprocedure Evaluation (Addendum)
Anesthesia Evaluation  Patient identified by MRN, date of birth, ID band Patient awake    Reviewed: Allergy & Precautions, NPO status   Airway Mallampati: II  TM Distance: <3 FB Neck ROM: full    Dental  (+) Teeth Intact, Dental Advidsory Given   Pulmonary shortness of breath and with exertion,    breath sounds clear to auscultation       Cardiovascular hypertension, Pt. on medications + Peripheral Vascular Disease and +CHF  + dysrhythmias  Rhythm:regular     Neuro/Psych    GI/Hepatic   Endo/Other  diabetes  Renal/GU Renal disease     Musculoskeletal   Abdominal   Peds  Hematology   Anesthesia Other Findings   Reproductive/Obstetrics                          Anesthesia Physical Anesthesia Plan  ASA: III  Anesthesia Plan: General   Post-op Pain Management:    Induction: Intravenous  PONV Risk Score and Plan: 3 and Ondansetron and Midazolam  Airway Management Planned: LMA  Additional Equipment:   Intra-op Plan:   Post-operative Plan: Extubation in OR  Informed Consent:     Dental Advisory Given  Plan Discussed with: Anesthesiologist, CRNA and Surgeon  Anesthesia Plan Comments:       Anesthesia Quick Evaluation

## 2019-02-14 ENCOUNTER — Encounter (HOSPITAL_COMMUNITY)
Admission: RE | Disposition: A | Discharge status: Home / Self Care | Payer: Self-pay | Source: Home / Self Care | Attending: Vascular Surgery

## 2019-02-14 ENCOUNTER — Ambulatory Visit (HOSPITAL_COMMUNITY)
Admission: RE | Admit: 2019-02-14 | Discharge: 2019-02-14 | Disposition: A | Discharge status: Home / Self Care | Payer: BC Managed Care – PPO | Attending: Vascular Surgery | Admitting: Vascular Surgery

## 2019-02-14 ENCOUNTER — Ambulatory Visit (HOSPITAL_COMMUNITY): Payer: BC Managed Care – PPO | Admitting: Certified Registered Nurse Anesthetist

## 2019-02-14 ENCOUNTER — Other Ambulatory Visit: Payer: Self-pay

## 2019-02-14 ENCOUNTER — Encounter (HOSPITAL_COMMUNITY): Payer: Self-pay

## 2019-02-14 DIAGNOSIS — E1122 Type 2 diabetes mellitus with diabetic chronic kidney disease: Secondary | ICD-10-CM | POA: Insufficient documentation

## 2019-02-14 DIAGNOSIS — I4891 Unspecified atrial fibrillation: Secondary | ICD-10-CM | POA: Insufficient documentation

## 2019-02-14 DIAGNOSIS — Z7901 Long term (current) use of anticoagulants: Secondary | ICD-10-CM | POA: Diagnosis not present

## 2019-02-14 DIAGNOSIS — Z794 Long term (current) use of insulin: Secondary | ICD-10-CM | POA: Insufficient documentation

## 2019-02-14 DIAGNOSIS — Z992 Dependence on renal dialysis: Secondary | ICD-10-CM

## 2019-02-14 DIAGNOSIS — M069 Rheumatoid arthritis, unspecified: Secondary | ICD-10-CM | POA: Diagnosis not present

## 2019-02-14 DIAGNOSIS — D649 Anemia, unspecified: Secondary | ICD-10-CM | POA: Diagnosis not present

## 2019-02-14 DIAGNOSIS — N186 End stage renal disease: Secondary | ICD-10-CM | POA: Insufficient documentation

## 2019-02-14 DIAGNOSIS — I132 Hypertensive heart and chronic kidney disease with heart failure and with stage 5 chronic kidney disease, or end stage renal disease: Secondary | ICD-10-CM | POA: Insufficient documentation

## 2019-02-14 DIAGNOSIS — Y841 Kidney dialysis as the cause of abnormal reaction of the patient, or of later complication, without mention of misadventure at the time of the procedure: Secondary | ICD-10-CM | POA: Diagnosis not present

## 2019-02-14 DIAGNOSIS — Z79899 Other long term (current) drug therapy: Secondary | ICD-10-CM | POA: Diagnosis not present

## 2019-02-14 DIAGNOSIS — T82590A Other mechanical complication of surgically created arteriovenous fistula, initial encounter: Secondary | ICD-10-CM | POA: Insufficient documentation

## 2019-02-14 DIAGNOSIS — T82898A Other specified complication of vascular prosthetic devices, implants and grafts, initial encounter: Secondary | ICD-10-CM

## 2019-02-14 HISTORY — PX: FISTULA SUPERFICIALIZATION: SHX6341

## 2019-02-14 HISTORY — PX: LIGATION OF ARTERIOVENOUS  FISTULA: SHX5948

## 2019-02-14 LAB — POCT I-STAT, CHEM 8
BUN: 50 mg/dL — ABNORMAL HIGH (ref 6–20)
Calcium, Ion: 1.16 mmol/L (ref 1.15–1.40)
Chloride: 96 mmol/L — ABNORMAL LOW (ref 98–111)
Creatinine, Ser: 9.6 mg/dL — ABNORMAL HIGH (ref 0.61–1.24)
Glucose, Bld: 192 mg/dL — ABNORMAL HIGH (ref 70–99)
HCT: 31 % — ABNORMAL LOW (ref 39.0–52.0)
Hemoglobin: 10.5 g/dL — ABNORMAL LOW (ref 13.0–17.0)
Potassium: 4.5 mmol/L (ref 3.5–5.1)
Sodium: 137 mmol/L (ref 135–145)
TCO2: 28 mmol/L (ref 22–32)

## 2019-02-14 LAB — GLUCOSE, CAPILLARY
Glucose-Capillary: 196 mg/dL — ABNORMAL HIGH (ref 70–99)
Glucose-Capillary: 163 mg/dL — ABNORMAL HIGH (ref 70–99)

## 2019-02-14 LAB — PROTIME-INR
INR: 1.1 (ref 0.8–1.2)
Prothrombin Time: 13.6 seconds (ref 11.4–15.2)

## 2019-02-14 SURGERY — LIGATION OF ARTERIOVENOUS  FISTULA
Anesthesia: General | Laterality: Left

## 2019-02-14 MED ORDER — MIDAZOLAM HCL 2 MG/2ML IJ SOLN
INTRAMUSCULAR | Status: AC
  Filled 2019-02-14: qty 2

## 2019-02-14 MED ORDER — FENTANYL CITRATE (PF) 250 MCG/5ML IJ SOLN
INTRAMUSCULAR | Status: AC
  Filled 2019-02-14: qty 5

## 2019-02-14 MED ORDER — HYDROCODONE-ACETAMINOPHEN 5-325 MG PO TABS: 1.0000 | ORAL_TABLET | Freq: Four times a day (QID) | ORAL | 0 refills | Status: AC | PRN

## 2019-02-14 MED ORDER — FENTANYL CITRATE (PF) 100 MCG/2ML IJ SOLN
INTRAMUSCULAR | Status: DC | PRN
  Administered 2019-02-14: 100 ug via INTRAVENOUS

## 2019-02-14 MED ORDER — LIDOCAINE HCL (PF) 1 % IJ SOLN
INTRAMUSCULAR | Status: AC
  Filled 2019-02-14: qty 30

## 2019-02-14 MED ORDER — SODIUM CHLORIDE 0.9 % IV SOLN: INTRAVENOUS | Status: DC

## 2019-02-14 MED ORDER — SODIUM CHLORIDE 0.9 % IV SOLN
INTRAVENOUS | Status: DC | PRN
  Administered 2019-02-14: 500 mL

## 2019-02-14 MED ORDER — PHENYLEPHRINE HCL (PRESSORS) 10 MG/ML IV SOLN
INTRAVENOUS | Status: DC | PRN
  Administered 2019-02-14 (×2): 80 ug via INTRAVENOUS

## 2019-02-14 MED ORDER — ONDANSETRON HCL 4 MG/2ML IJ SOLN
INTRAMUSCULAR | Status: DC | PRN
  Administered 2019-02-14: 4 mg via INTRAVENOUS

## 2019-02-14 MED ORDER — SUCCINYLCHOLINE CHLORIDE 20 MG/ML IJ SOLN
INTRAMUSCULAR | Status: DC | PRN
  Administered 2019-02-14: 140 mg via INTRAVENOUS

## 2019-02-14 MED ORDER — SODIUM CHLORIDE 0.9 % IV SOLN
INTRAVENOUS | Status: AC
  Filled 2019-02-14: qty 1.2

## 2019-02-14 MED ORDER — MIDAZOLAM HCL 5 MG/5ML IJ SOLN
INTRAMUSCULAR | Status: DC | PRN
  Administered 2019-02-14: 2 mg via INTRAVENOUS

## 2019-02-14 MED ORDER — PHENYLEPHRINE HCL-NACL 10-0.9 MG/250ML-% IV SOLN
INTRAVENOUS | Status: DC | PRN
  Administered 2019-02-14: 50 ug/min via INTRAVENOUS

## 2019-02-14 MED ORDER — LIDOCAINE 2% (20 MG/ML) 5 ML SYRINGE
INTRAMUSCULAR | Status: DC | PRN
  Administered 2019-02-14: 60 mg via INTRAVENOUS

## 2019-02-14 MED ORDER — 0.9 % SODIUM CHLORIDE (POUR BTL) OPTIME
TOPICAL | Status: DC | PRN
  Administered 2019-02-14: 1000 mL

## 2019-02-14 MED ORDER — CHLORHEXIDINE GLUCONATE 4 % EX LIQD: 60.0000 mL | Freq: Once | CUTANEOUS | Status: DC

## 2019-02-14 MED ORDER — SODIUM CHLORIDE 0.9 % IV SOLN
INTRAVENOUS | Status: DC | PRN
  Administered 2019-02-14 (×2): via INTRAVENOUS

## 2019-02-14 MED ORDER — PROPOFOL 10 MG/ML IV BOLUS
INTRAVENOUS | Status: DC | PRN
  Administered 2019-02-14: 200 mg via INTRAVENOUS

## 2019-02-14 MED ORDER — EPHEDRINE SULFATE 50 MG/ML IJ SOLN
INTRAMUSCULAR | Status: DC | PRN
  Administered 2019-02-14 (×2): 10 mg via INTRAVENOUS

## 2019-02-14 SURGICAL SUPPLY — 42 items
ADH SKN CLS APL DERMABOND .7 (GAUZE/BANDAGES/DRESSINGS) ×1
AGENT HMST SPONGE THK3/8 (HEMOSTASIS)
ARMBAND PINK RESTRICT EXTREMIT (MISCELLANEOUS) ×2 IMPLANT
CANISTER SUCT 3000ML PPV (MISCELLANEOUS) ×2 IMPLANT
CLIP VESOCCLUDE MED 6/CT (CLIP) ×2 IMPLANT
CLIP VESOCCLUDE SM WIDE 6/CT (CLIP) ×2 IMPLANT
COVER PROBE W GEL 5X96 (DRAPES) IMPLANT
COVER WAND RF STERILE (DRAPES) ×1 IMPLANT
DERMABOND ADVANCED (GAUZE/BANDAGES/DRESSINGS) ×1
DERMABOND ADVANCED .7 DNX12 (GAUZE/BANDAGES/DRESSINGS) ×1 IMPLANT
DRAIN PENROSE 1/4X12 LTX STRL (WOUND CARE) IMPLANT
ELECT REM PT RETURN 9FT ADLT (ELECTROSURGICAL) ×2
ELECTRODE REM PT RTRN 9FT ADLT (ELECTROSURGICAL) ×1 IMPLANT
GLOVE BIO SURGEON STRL SZ7.5 (GLOVE) ×3 IMPLANT
GLOVE BIOGEL PI IND STRL 6.5 (GLOVE) IMPLANT
GLOVE BIOGEL PI INDICATOR 6.5 (GLOVE) ×5
GLOVE ECLIPSE 6.5 STRL STRAW (GLOVE) ×1 IMPLANT
GLOVE SURG SS PI 6.0 STRL IVOR (GLOVE) ×2 IMPLANT
GOWN STRL REUS W/ TWL LRG LVL3 (GOWN DISPOSABLE) ×3 IMPLANT
GOWN STRL REUS W/ TWL XL LVL3 (GOWN DISPOSABLE) IMPLANT
GOWN STRL REUS W/TWL LRG LVL3 (GOWN DISPOSABLE) ×8
GOWN STRL REUS W/TWL XL LVL3 (GOWN DISPOSABLE) ×2
HEMOSTAT SPONGE AVITENE ULTRA (HEMOSTASIS) IMPLANT
HOVERMATT SINGLE USE (MISCELLANEOUS) ×1 IMPLANT
KIT BASIN OR (CUSTOM PROCEDURE TRAY) ×2 IMPLANT
KIT TURNOVER KIT B (KITS) ×2 IMPLANT
LOOP VESSEL MINI RED (MISCELLANEOUS) IMPLANT
NS IRRIG 1000ML POUR BTL (IV SOLUTION) ×2 IMPLANT
PACK CV ACCESS (CUSTOM PROCEDURE TRAY) ×2 IMPLANT
PAD ARMBOARD 7.5X6 YLW CONV (MISCELLANEOUS) ×4 IMPLANT
SUT PROLENE 5 0 C 1 24 (SUTURE) IMPLANT
SUT PROLENE 6 0 CC (SUTURE) ×1 IMPLANT
SUT PROLENE 7 0 BV 1 (SUTURE) ×3 IMPLANT
SUT SILK 3 0 (SUTURE) ×2
SUT SILK 3-0 18XBRD TIE 12 (SUTURE) IMPLANT
SUT VIC AB 3-0 SH 27 (SUTURE) ×6
SUT VIC AB 3-0 SH 27X BRD (SUTURE) ×1 IMPLANT
SUT VIC AB 4-0 PS2 18 (SUTURE) ×1 IMPLANT
SUT VICRYL 4-0 PS2 18IN ABS (SUTURE) ×3 IMPLANT
TOWEL GREEN STERILE (TOWEL DISPOSABLE) ×2 IMPLANT
UNDERPAD 30X30 (UNDERPADS AND DIAPERS) ×2 IMPLANT
WATER STERILE IRR 1000ML POUR (IV SOLUTION) ×2 IMPLANT

## 2019-02-14 NOTE — Interval H&P Note (Signed)
History and Physical Interval Note:  02/14/2019 7:26 AM  John Bailey  has presented today for surgery, with the diagnosis of COMPLICATION OF ARTERIOVENOUS FISTULA LEFT ARM.  The various methods of treatment have been discussed with the patient and family. After consideration of risks, benefits and other options for treatment, the patient has consented to  Procedure(s): LIGATION OF SIDE BRANCH ARTERIOVENOUS  FISTULA LEFT ARM (Left) FISTULA SUPERFICIALIZATION ARTERIOVENOUS FISTULA LEFT ARM (Left) as a surgical intervention.  The patient's history has been reviewed, patient examined, no change in status, stable for surgery.  I have reviewed the patient's chart and labs.  Questions were answered to the patient's satisfaction.     Ruta Hinds

## 2019-02-14 NOTE — Discharge Instructions (Signed)
° °  [facility] [place] ° °Discharge Instructions ° °AV Fistula or Graft Surgery for Dialysis Access ° °Please refer to the following instructions for your post-procedure care. Your surgeon or physician assistant will discuss any changes with you. ° °Activity ° °You may drive the day following your surgery, if you are comfortable and no longer taking prescription pain medication. Resume full activity as the soreness in your incision resolves. ° °Bathing/Showering ° °You may shower after you go home. Keep your incision dry for 48 hours. Do not soak in a bathtub, hot tub, or swim until the incision heals completely. You may not shower if you have a hemodialysis catheter. ° °Incision Care ° °Clean your incision with mild soap and water after 48 hours. Pat the area dry with a clean towel. You do not need a bandage unless otherwise instructed. Do not apply any ointments or creams to your incision. You may have skin glue on your incision. Do not peel it off. It will come off on its own in about one week. Your arm may swell a bit after surgery. To reduce swelling use pillows to elevate your arm so it is above your heart. Your doctor will tell you if you need to lightly wrap your arm with an ACE bandage. ° °Diet ° °Resume your normal diet. There are not special food restrictions following this procedure. In order to heal from your surgery, it is CRITICAL to get adequate nutrition. Your body requires vitamins, minerals, and protein. Vegetables are the best source of vitamins and minerals. Vegetables also provide the perfect balance of protein. Processed food has little nutritional value, so try to avoid this. ° °Medications ° °Resume taking all of your medications. If your incision is causing pain, you may take over-the counter pain relievers such as acetaminophen (Tylenol). If you were prescribed a stronger pain medication, please be aware these medications can cause nausea and constipation. Prevent  nausea by taking the medication with a snack or meal. Avoid constipation by drinking plenty of fluids and eating foods with high amount of fiber, such as fruits, vegetables, and grains. Do not take Tylenol if you are taking prescription pain medications. ° ° ° ° °Follow up °Your surgeon may want to see you in the office following your access surgery. If so, this will be arranged at the time of your surgery. ° °Please call us immediately for any of the following conditions: ° °Increased pain, redness, drainage (pus) from your incision site °Fever of 101 degrees or higher °Severe or worsening pain at your incision site °Hand pain or numbness. ° °Reduce your risk of vascular disease: ° °Stop smoking. If you would like help, call [facility] at 1-800-QUIT-NOW (1-800-784-8669) or [place] at 336-586-4000 ° °Manage your cholesterol °Maintain a desired weight °Control your diabetes °Keep your blood pressure down ° °Dialysis ° °It will take several weeks to several months for your new dialysis access to be ready for use. Your surgeon will determine when it is OK to use it. Your nephrologist will continue to direct your dialysis. You can continue to use your Permcath until your new access is ready for use. ° °If you have any questions, please call the office at [phone]. ° °

## 2019-02-14 NOTE — Anesthesia Postprocedure Evaluation (Signed)
Anesthesia Post Note  Patient: TAMARA DEMAREST  Procedure(s) Performed: LIGATION OF SIDE BRANCH ARTERIOVENOUS  FISTULA LEFT ARM (Left ) FISTULA SUPERFICIALIZATION ARTERIOVENOUS FISTULA LEFT ARM (Left )     Patient location during evaluation: PACU Anesthesia Type: General Level of consciousness: awake Pain management: pain level controlled Vital Signs Assessment: post-procedure vital signs reviewed and stable Respiratory status: spontaneous breathing Cardiovascular status: stable Postop Assessment: no apparent nausea or vomiting Anesthetic complications: no    Last Vitals:  Vitals:   02/14/19 0939 02/14/19 0940  BP: (!) 142/93   Pulse: 90 91  Resp: 14 (!) 24  Temp: 36.8 C   SpO2: 95% 100%    Last Pain:  Vitals:   02/14/19 0939  PainSc: Asleep                 Salena Ortlieb

## 2019-02-14 NOTE — Op Note (Signed)
Procedure: Superficialization left arm AV fistula with sidebranch ligation  Preoperative diagnosis: Nonmaturing AV fistula left arm  Postoperative diagnosis: Same  Anesthesia: General  Assistant: Arlee Muslim, PA-C  Operative findings: 4 side branches ligated  Operative details: After obtaining informed consent, the patient taken the operating room.  The patient is placed in supine position operating table.  After induction of general anesthesia patient's entire left upper extremities prepped and draped in usual sterile fashion.  Longitudinal incision was made in the left arm just above the antecubital crease carried down through subcutaneous tissues down the level of fistula.  The fistula was about 5 to 6 mm in diameter throughout its course.  Fistula was dissected free circumferentially from the antecubital area all the way up to the shoulder level.  This was done through 3 separate incisions.  There were 4 side branches which were ligated and divided tween silk ties or with a Prolene suture.  Next the subcutaneous tissues overlying the fistula were debrided back with cautery.  Hemostasis was obtained.  The skin was then reapproximated in all 3 incisions using a running 4-0 Vicryl subcuticular stitch.  Dermabond was applied.  Ruta Hinds, MD [facility] Office: [phone]

## 2019-02-14 NOTE — Anesthesia Procedure Notes (Signed)
Procedure Name: Intubation Date/Time: 02/14/2019 7:38 AM Performed by: Neldon Newport, CRNA Pre-anesthesia Checklist: Timeout performed, Patient being monitored, Suction available, Emergency Drugs available and Patient identified Patient Re-evaluated:Patient Re-evaluated prior to induction Oxygen Delivery Method: Circle system utilized Preoxygenation: Pre-oxygenation with 100% oxygen Induction Type: IV induction and Rapid sequence Ventilation: Mask ventilation without difficulty and Oral airway inserted - appropriate to patient size Laryngoscope Size: Glidescope and 4 Grade View: Grade I Tube type: Oral Tube size: 7.5 mm Number of attempts: 1 Placement Confirmation: breath sounds checked- equal and bilateral,  positive ETCO2 and ETT inserted through vocal cords under direct vision Secured at: 23 cm Tube secured with: Tape Dental Injury: Teeth and Oropharynx as per pre-operative assessment

## 2019-02-14 NOTE — Transfer of Care (Signed)
Immediate Anesthesia Transfer of Care Note  Patient: AMAURI HOCHSTETLER  Procedure(s) Performed: LIGATION OF SIDE BRANCH ARTERIOVENOUS  FISTULA LEFT ARM (Left ) FISTULA SUPERFICIALIZATION ARTERIOVENOUS FISTULA LEFT ARM (Left )  Patient Location: PACU  Anesthesia Type:General  Level of Consciousness: awake, alert  and oriented  Airway & Oxygen Therapy: Patient Spontanous Breathing and Patient connected to face mask oxygen  Post-op Assessment: Report given to RN, Post -op Vital signs reviewed and stable and Patient moving all extremities X 4  Post vital signs: Reviewed and stable  Last Vitals:  Vitals Value Taken Time  BP 142/93 02/14/19 0939  Temp    Pulse 94 02/14/19 0939  Resp 19 02/14/19 0939  SpO2 97 % 02/14/19 0939  Vitals shown include unvalidated device data.  Last Pain:  Vitals:   02/14/19 0642  PainSc: 0-No pain         Complications: No apparent anesthesia complications

## 2019-02-15 ENCOUNTER — Encounter (HOSPITAL_COMMUNITY): Payer: Self-pay | Admitting: Vascular Surgery

## 2019-03-02 ENCOUNTER — Other Ambulatory Visit: Payer: Self-pay

## 2019-03-02 ENCOUNTER — Ambulatory Visit (INDEPENDENT_AMBULATORY_CARE_PROVIDER_SITE_OTHER): Payer: Self-pay | Admitting: Physician Assistant

## 2019-03-02 VITALS — BP 139/91 | HR 100 | Temp 97.7°F | Resp 20 | Ht 71.0 in | Wt 317.9 lb

## 2019-03-02 DIAGNOSIS — N186 End stage renal disease: Secondary | ICD-10-CM

## 2019-03-02 DIAGNOSIS — Z992 Dependence on renal dialysis: Secondary | ICD-10-CM

## 2019-03-02 NOTE — Progress Notes (Signed)
POST OPERATIVE OFFICE NOTE    CC:  F/u for surgery  HPI:  This is a 48 y.o. male who is s/p Superficialization left arm AV fistula with sidebranch ligation on 02/14/2019 by Dr. Oneida Alar. He is here for post operative follow up and incision check.  He denise pain, loss of motor and numbness in the left UE.  Original placement of a left brachiocephalic AV fistula April 2020.  He previously had a failed left radiocephalic AV fistula in February 2019.  He was sent today for further evaluation after recent infiltration to consider Superficialization of the AV fistula.  He currently is dialyzing via catheter.  His dialysis today is Monday Wednesday Friday.  He is on Eliquis for atrial fibrillation.    No Known Allergies  Current Outpatient Medications  Medication Sig Dispense Refill  . albuterol (PROVENTIL HFA;VENTOLIN HFA) 108 (90 Base) MCG/ACT inhaler Inhale 2 puffs into the lungs every 6 (six) hours as needed for wheezing or shortness of breath.     [name] amiodarone (PACERONE) 200 MG tablet Take 1 tablet (200 mg total) by mouth daily. (Patient not taking: Reported on 02/10/2019) 30 tablet 0  . apixaban (ELIQUIS) 5 MG TABS tablet Take 1 tablet (5 mg total) by mouth 2 (two) times daily. 60 tablet 0  . B Complex-C-Folic Acid (DIALYVITE PO) Take 2 tablets by mouth daily with supper.     . calcitRIOL (ROCALTROL) 0.5 MCG capsule Take 1 capsule (0.5 mcg total) by mouth every Tuesday, Thursday, and Saturday at 6 PM. (Patient taking differently: Take 0.5 mcg by mouth every Monday, Wednesday, and Friday with hemodialysis. ) 90 capsule 0  . carvedilol (COREG) 3.125 MG tablet Take 1 tablet (3.125 mg total) by mouth 2 (two) times daily with a meal. 180 tablet 3  . Continuous Blood Gluc Sensor (FREESTYLE LIBRE 14 DAY SENSOR) MISC USE TO TEST BLOOD SUGAR. CHANGE EVERY 14 DAYS TO A NEW SENSOR ON NEW PLACE  0  . diclofenac sodium (VOLTAREN) 1 % GEL Apply 4 g topically 2 (two) times daily as needed (apply over affected  area). (Patient not taking: Reported on 02/10/2019)    . docusate sodium (COLACE) 100 MG capsule Take 100 mg by mouth daily as needed for mild constipation.    . ferric citrate (AURYXIA) 1 GM 210 MG(Fe) tablet Take 210-840 mg by mouth See admin instructions. Take 840 mg with each meal and 210 with each snack    . gabapentin (NEURONTIN) 300 MG capsule Take 600 mg by mouth 2 (two) times daily.     . Glucose Blood (FREESTYLE LITE TEST VI)     . HYDROcodone-acetaminophen (NORCO) 5-325 MG tablet Take 1 tablet by mouth every 6 (six) hours as needed for moderate pain. 20 tablet 0  . insulin glargine (LANTUS) 100 UNIT/ML injection Inject 0.15 mLs (15 Units total) into the skin at bedtime. (Patient taking differently: Inject 16 Units into the skin at bedtime. )    . Lancets (FREESTYLE) lancets     . NONFORMULARY OR COMPOUNDED ITEM [facility]:  Peripheral Neuropathy Cream -Bupivacaine 1%, Doxepin 3%, Gabapentin 6%, Pentoxifylline 3%. Topiramate 1%, apply 1-2 grams to affected area 3-4 times a day. (Patient not taking: Reported on 02/10/2019) 30 each 5  . NOVOLOG FLEXPEN 100 UNIT/ML FlexPen Inject 2-12 Units as directed 3 (three) times daily with meals. Per sliding scale  0  . predniSONE (DELTASONE) 10 MG tablet Take 1 tablet (10 mg total) by mouth daily with breakfast. Last dose 4/25 (Patient  not taking: Reported on 02/10/2019)    . rOPINIRole (REQUIP) 0.25 MG tablet Take 0.25 mg by mouth daily as needed (restless legs).     . SURE COMFORT PEN NEEDLES 31G X 5 MM MISC USE AS DIRECTED FOR insulin    . triamcinolone (KENALOG) 0.025 % cream Apply 1 application topically daily as needed (itching).      No current facility-administered medications for this visit.     ROS:  See HPI  Physical Exam:    Incision:  Healing slowly with residual scars. Extremities:  Grip 5/5, palpable thrill in fistula left UE Lungs non labored breathing Gen NAD  Assessment/Plan:  This is a 48 y.o. male who is s/p:  left brachiocephalic AV fistula April 2020.  He previously had a failed left radiocephalic AV fistula in February 2019.  He was sent today for further evaluation after recent infiltration to followed by Superficialization of the AV fistula.    His incisions are healing slowly with episodes of scab getting pulled off and causing minimal bleeding episodes with secondary cause of anticoagulation use. Our plan is to let the incisions completley heal and the fistula may be accessed as of January 18th 2021.  He can use the left UE daily without restrictions.     Roxy Horseman  PA-C [facility] [phone]  Clinic MD:  Scot Dock

## 2019-04-13 ENCOUNTER — Telehealth: Payer: Self-pay

## 2019-04-13 ENCOUNTER — Telehealth (INDEPENDENT_AMBULATORY_CARE_PROVIDER_SITE_OTHER): Payer: BC Managed Care – PPO | Admitting: Family Medicine

## 2019-04-13 DIAGNOSIS — I482 Chronic atrial fibrillation, unspecified: Secondary | ICD-10-CM | POA: Diagnosis not present

## 2019-04-13 DIAGNOSIS — N186 End stage renal disease: Secondary | ICD-10-CM | POA: Diagnosis not present

## 2019-04-13 DIAGNOSIS — Z992 Dependence on renal dialysis: Secondary | ICD-10-CM

## 2019-04-13 DIAGNOSIS — I5032 Chronic diastolic (congestive) heart failure: Secondary | ICD-10-CM

## 2019-04-13 NOTE — Telephone Encounter (Signed)
Tried calling patient to get him ready for his 11:30 AM virtual visit with Almyra Deforest, PA-C. The number listed for his home and mobile will not allow for a voice message to be left due to voicemail is full. Will try calling patient again.

## 2019-04-13 NOTE — Progress Notes (Deleted)
{Choose 1 Note Type (Telehealth Visit or Telephone Visit):[phone]}   Date:  04/13/2019   ID:  John Bailey, DOB [birth year], MRN [id number]  {Patient Location:[phone]::"Home"} {Provider Location:[phone]::"Home"}  PCP:  [name], MD  Cardiologist:  Pixie Casino, MD  Electrophysiologist:  None   Evaluation Performed:  {Choose Visit Type:[phone]::"Follow-Up Visit"}  Chief Complaint:  ***  History of Present Illness:    John Bailey is a 49 y.o. male with past medical history of atrial flutter, chronic systolic heart failure, end-stage renal disease, hyperlipidemia, LVH, insulin-dependent diabetes mellitus and morbid obesity.  The patient {does/does not:200015} have symptoms concerning for COVID-19 infection (fever, chills, cough, or new shortness of breath).    Past Medical History:  Diagnosis Date  . Anemia   . Atrial flutter with rapid ventricular response ([place]) 08/06/2015   a. s/p DCCV in 07/2015 with recurrent atrial fibrillation and DCCV in 12/2016. Initially successful but noted to be back in atrial fibrillation within 2 weeks of DCCV.   [name] Chronic diastolic (congestive) heart failure ([facility])    a. EF 50-55% by echo in 06/2016.  . Diabetes ([place])    Type II  . Dysrhythmia    Aflutter  . ESRD (end stage renal disease) on dialysis ([place])    "TTS; at [facility]" (06/08/2017)  . History of blood transfusion   . Hypertension   . Peripheral vascular disease ([place])   . Pneumonia   . Rheumatoid arthritis [facility])    Past Surgical History:  Procedure Laterality Date  . A/V FISTULAGRAM Left 10/29/2017   Procedure: A/V FISTULAGRAM;  Surgeon: Elam Dutch, MD;  Location: [place] CV LAB;  Service: Cardiovascular;  Laterality: Left;  . AV FISTULA PLACEMENT Left 05/10/2017   Procedure: CREATION of Left Radicephalic Fistula;  Surgeon: Rosetta Posner, MD;  Location: [facility] OR;  Service: Vascular;  Laterality: Left;  . AV FISTULA PLACEMENT Left 07/08/2018   Procedure:  BRACHIOCEPHALIC ARTERIOVENOUS (AV) FISTULA CREATION LEFT ARM;  Surgeon: Elam Dutch, MD;  Location: [place];  Service: Vascular;  Laterality: Left;  . CARDIOVERSION N/A 08/01/2015   Procedure: CARDIOVERSION;  Surgeon: Josue Hector, MD;  Location: [facility] ENDOSCOPY;  Service: Cardiovascular;  Laterality: N/A;  . CARDIOVERSION N/A 12/18/2016   Procedure: CARDIOVERSION;  Surgeon: [name], MD;  Location: [facility] ENDOSCOPY;  Service: Cardiovascular;  Laterality: N/A;  . CARDIOVERSION N/A 04/30/2017   Procedure: CARDIOVERSION;  Surgeon: Larey Dresser, MD;  Location: [facility]COPY;  Service: Cardiovascular;  Laterality: N/A;  . CHOLECYSTECTOMY    . DEBRIDMENT OF DECUBITUS ULCER N/A 06/04/2017   Procedure: DEBRIDMENT OF SACRAL DECUBITUS ULCER;  Surgeon: Georganna Skeans, MD;  Location: [place];  Service: General;  Laterality: N/A;  . FISTULA SUPERFICIALIZATION Left 02/14/2019   Procedure: FISTULA SUPERFICIALIZATION ARTERIOVENOUS FISTULA LEFT ARM;  Surgeon: Elam Dutch, MD;  Location: [place];  Service: Vascular;  Laterality: Left;  . INCISION AND DRAINAGE ABSCESS N/A 06/14/2017   Procedure: INCISION AND DRAINAGE GLUTEAL ABSCESS;  Surgeon: Kieth Brightly, Arta Bruce, MD;  Location: [place];  Service: General;  Laterality: N/A;  Packing to coccyx with packing and drain to right buttock  . INSERTION OF DIALYSIS CATHETER Right 05/04/2017   Procedure: INSERTION OF TUNNELED  DIALYSIS CATHETER;  Surgeon: Conrad Avon, MD;  Location: [place];  Service: Vascular;  Laterality: Right;  . IR FLUORO GUIDE CV LINE RIGHT  04/27/2017  . IR US GUIDE VASC ACCESS RIGHT  04/27/2017  . LIGATION OF ARTERIOVENOUS  FISTULA Left  02/14/2019   Procedure: LIGATION OF SIDE BRANCH ARTERIOVENOUS  FISTULA LEFT ARM;  Surgeon: Elam Dutch, MD;  Location: [place];  Service: Vascular;  Laterality: Left;  . PERIPHERAL VASCULAR BALLOON ANGIOPLASTY  10/29/2017   Procedure: PERIPHERAL VASCULAR BALLOON ANGIOPLASTY;  Surgeon: Elam Dutch, MD;   Location: [place] CV LAB;  Service: Cardiovascular;;  Lt arm Fistula   . TEE WITHOUT CARDIOVERSION N/A 08/01/2015   Procedure: TRANSESOPHAGEAL ECHOCARDIOGRAM (TEE);  Surgeon: Josue Hector, MD;  Location[facility] ENDOSCOPY;  Service: Cardiovascular;  Laterality: N/A;     No outpatient medications have been marked as taking for the 04/13/19 encounter (Appointment) with Almyra Deforest, [place].     Allergies:   Patient has no known allergies.   Social History   Tobacco Use  . Smoking status: Never Smoker  . Smokeless tobacco: Never Used  Substance Use Topics  . Alcohol use: No  . Drug use: No     Family Hx: The patient's family history includes Heart disease in his mother; Hyperlipidemia in his father; Hypertension in his father.  ROS:   Please see the history of present illness.    *** All other systems reviewed and are negative.   Prior CV studies:   The following studies were reviewed today:  ***  Labs/Other Tests and Data Reviewed:    EKG:  {EKG/Telemetry Strips Reviewed:[phone]}  Recent Labs: 02/14/2019: BUN 50; Creatinine, Ser 9.60; Hemoglobin 10.5; Potassium 4.5; Sodium 137   Recent Lipid Panel No results found for: CHOL, TRIG, HDL, CHOLHDL, LDLCALC, LDLDIRECT  Wt Readings from Last 3 Encounters:  03/02/19 (!) 317 lb 14.4 oz (144.2 kg)  02/14/19 (!) 310 lb (140.6 kg)  01/26/19 (!) 319 lb 4.8 oz (144.8 kg)     Objective:    Vital Signs:  There were no vitals taken for this visit.   {[facility] Virtual Exam (Optional):[phone]::"VITAL SIGNS:  reviewed"}  ASSESSMENT & PLAN:    1. ***  COVID-19 Education: The signs and symptoms of COVID-19 were discussed with the patient and how to seek care for testing (follow up with PCP or arrange E-visit).  ***The importance of social distancing was discussed today.  Time:   Today, I have spent *** minutes with the patient with telehealth technology discussing the above problems.     Medication Adjustments/Labs and Tests  Ordered: Current medicines are reviewed at length with the patient today.  Concerns regarding medicines are outlined above.   Tests Ordered: No orders of the defined types were placed in this encounter.   Medication Changes: No orders of the defined types were placed in this encounter.   Follow Up:  {F/U Format:[phone]} {follow up:15908}  Hilbert Corrigan, [place]  04/13/2019 10:53 AM    [facility]

## 2019-04-13 NOTE — Progress Notes (Signed)
Virtual Visit via Telephone Note   This visit type was conducted due to national recommendations for restrictions regarding the COVID-19 Pandemic (e.g. social distancing) in an effort to limit this patient's exposure and mitigate transmission in our community.  Due to his co-morbid illnesses, this patient is at least at moderate risk for complications without adequate follow up.  This format is felt to be most appropriate for this patient at this time.  The patient did not have access to video technology/had technical difficulties with video requiring transitioning to audio format only (telephone).  All issues noted in this document were discussed and addressed.  No physical exam could be performed with this format.  Please refer to the patient's chart for his  consent to telehealth for [facility].   Date:  04/13/2019   ID:  John Bailey, DOB 1970/06/19, MRN [id number]  Patient Location: Home Provider Location: Office  PCP:  [name], MD  Cardiologist:  Pixie Casino, MD  Electrophysiologist:  None   Evaluation Performed:  Follow-Up Visit  Chief Complaint: Follow-up for history of atrial flutter, chronic diastolic heart failure, hypertension, PVD, ESRD on dialysis.    History of Present Illness:    John Bailey is a 49 y.o. male last encounter with Dr. Debara Pickett Aug 06, 2017.  Status post successful DCCV in 2017 but later converted back to atrial fibrillation within 2 weeks after procedure.  History of chronic diastolic congestive heart failure.  History of diabetes, end-stage renal disease on dialysis.  Most recent echocardiogram 2019 showed severe LVH, systolic function was normal.  EF was in the range of 55 to 60%.  Unable to assess diastolic function.  Mild MR.  Left atrium moderately dilated.  Trivial pericardial effusion was identified posterior to the heart.  Patient denies any recent issues other than minor surgical revision to his fistula recently.  Otherwise  denies any anginal or exertional symptoms, palpitations or arrhythmias, orthostatic symptoms, stroke or TIA-like symptoms, bleeding, denies any syncopal or near syncopal episodes.  States he is attempting to lose more weight and has been placed back on transplant list at [facility] for potential future renal transplant.  States his blood pressures are doing well usually systolic runs in the [id number].  States his heart rate resting is usually 80s to 90s.   Patient states he has been discontinued from adding amiodarone.  States he now takes carvedilol 3.125 mg p.o. twice daily instead of the previous 6.25 mg on his medication list.   The patient does not have symptoms concerning for COVID-19 infection (fever, chills, cough, or new shortness of breath).    Past Medical History:  Diagnosis Date  . Anemia   . Atrial flutter with rapid ventricular response ([place]) 08/06/2015   a. s/p DCCV in 07/2015 with recurrent atrial fibrillation and DCCV in 12/2016. Initially successful but noted to be back in atrial fibrillation within 2 weeks of DCCV.   [name] Chronic diastolic (congestive) heart failure (HCC)    a. EF 50-55% by echo in 06/2016.  . Diabetes ([place])    Type II  . Dysrhythmia    Aflutter  . ESRD (end stage renal disease) on dialysis ([place])    "TTS; at [facility]" (06/08/2017)  . History of blood transfusion   . Hypertension   . Peripheral vascular disease ([place])   . Pneumonia   . Rheumatoid arthritis [facility])    Past Surgical History:  Procedure Laterality Date  . A/V FISTULAGRAM Left 10/29/2017  Procedure: A/V FISTULAGRAM;  Surgeon: Elam Dutch, MD;  Location: [place] CV LAB;  Service: Cardiovascular;  Laterality: Left;  . AV FISTULA PLACEMENT Left 05/10/2017   Procedure: CREATION of Left Radicephalic Fistula;  Surgeon: Rosetta Posner, MD;  Location: [facility] OR;  Service: Vascular;  Laterality: Left;  . AV FISTULA PLACEMENT Left 07/08/2018   Procedure: BRACHIOCEPHALIC ARTERIOVENOUS  (AV) FISTULA CREATION LEFT ARM;  Surgeon: Elam Dutch, MD;  Location: [place];  Service: Vascular;  Laterality: Left;  . CARDIOVERSION N/A 08/01/2015   Procedure: CARDIOVERSION;  Surgeon: Josue Hector, MD;  Location: [facility] ENDOSCOPY;  Service: Cardiovascular;  Laterality: N/A;  . CARDIOVERSION N/A 12/18/2016   Procedure: CARDIOVERSION;  Surgeon: [name], MD;  Location: [facility] ENDOSCOPY;  Service: Cardiovascular;  Laterality: N/A;  . CARDIOVERSION N/A 04/30/2017   Procedure: CARDIOVERSION;  Surgeon: Larey Dresser, MD;  Location: [facility] ENDOSCOPY;  Service: Cardiovascular;  Laterality: N/A;  . CHOLECYSTECTOMY    . DEBRIDMENT OF DECUBITUS ULCER N/A 06/04/2017   Procedure: DEBRIDMENT OF SACRAL DECUBITUS ULCER;  Surgeon: Georganna Skeans, MD;  Location: [place];  Service: General;  Laterality: N/A;  . FISTULA SUPERFICIALIZATION Left 02/14/2019   Procedure: FISTULA SUPERFICIALIZATION ARTERIOVENOUS FISTULA LEFT ARM;  Surgeon: Elam Dutch, MD;  Location: [place];  Service: Vascular;  Laterality: Left;  . INCISION AND DRAINAGE ABSCESS N/A 06/14/2017   Procedure: INCISION AND DRAINAGE GLUTEAL ABSCESS;  Surgeon: Kieth Brightly, Arta Bruce, MD;  Location: [place];  Service: General;  Laterality: N/A;  Packing to coccyx with packing and drain to right buttock  . INSERTION OF DIALYSIS CATHETER Right 05/04/2017   Procedure: INSERTION OF TUNNELED  DIALYSIS CATHETER;  Surgeon: Conrad Fulton, MD;  Location: [place];  Service: Vascular;  Laterality: Right;  . IR FLUORO GUIDE CV LINE RIGHT  04/27/2017  . IR US GUIDE VASC ACCESS RIGHT  04/27/2017  . LIGATION OF ARTERIOVENOUS  FISTULA Left 02/14/2019   Procedure: LIGATION OF SIDE BRANCH ARTERIOVENOUS  FISTULA LEFT ARM;  Surgeon: Elam Dutch, MD;  Location: [place];  Service: Vascular;  Laterality: Left;  . PERIPHERAL VASCULAR BALLOON ANGIOPLASTY  10/29/2017   Procedure: PERIPHERAL VASCULAR BALLOON ANGIOPLASTY;  Surgeon: Elam Dutch, MD;  Location: [place] CV LAB;   Service: Cardiovascular;;  Lt arm Fistula   . TEE WITHOUT CARDIOVERSION N/A 08/01/2015   Procedure: TRANSESOPHAGEAL ECHOCARDIOGRAM (TEE);  Surgeon: Josue Hector, MD;  Location: [facility] ENDOSCOPY;  Service: Cardiovascular;  Laterality: N/A;     Current Meds  Medication Sig  . apixaban (ELIQUIS) 5 MG TABS tablet Take 1 tablet (5 mg total) by mouth 2 (two) times daily.  . calcitRIOL (ROCALTROL) 0.5 MCG capsule Take 1 capsule (0.5 mcg total) by mouth every Tuesday, Thursday, and Saturday at 6 PM. (Patient taking differently: Take 0.5 mcg by mouth every Monday, Wednesday, and Friday with hemodialysis. )  . carvedilol (COREG) 3.125 MG tablet Take 1 tablet (3.125 mg total) by mouth 2 (two) times daily with a meal.  . Continuous Blood Gluc Sensor (FREESTYLE LIBRE 14 DAY SENSOR) MISC USE TO TEST BLOOD SUGAR. CHANGE EVERY 14 DAYS TO A NEW SENSOR ON NEW PLACE  . ferric citrate (AURYXIA) 1 GM 210 MG(Fe) tablet Take 210-840 mg by mouth See admin instructions. Take 840 mg with each meal and 210 with each snack  . gabapentin (NEURONTIN) 300 MG capsule Take 600 mg by mouth 2 (two) times daily. Take 1 tablet in the mornings and 2 tablets in the evenings.  [name]  Glucose Blood (FREESTYLE LITE TEST VI)   . HYDROcodone-acetaminophen (NORCO) 5-325 MG tablet Take 1 tablet by mouth every 6 (six) hours as needed for moderate pain.  [name] insulin glargine (LANTUS) 100 UNIT/ML injection Inject 0.15 mLs (15 Units total) into the skin at bedtime. (Patient taking differently: Inject 17 Units into the skin at bedtime. )  . Lancets (FREESTYLE) lancets   . NOVOLOG FLEXPEN 100 UNIT/ML FlexPen Inject 2-12 Units as directed 3 (three) times daily with meals. Per sliding scale  . rOPINIRole (REQUIP) 0.25 MG tablet Take 0.25 mg by mouth daily as needed (restless legs).   . SURE COMFORT PEN NEEDLES 31G X 5 MM MISC USE AS DIRECTED FOR insulin     Allergies:   Patient has no known allergies.   Social History   Tobacco Use  . Smoking status:  Never Smoker  . Smokeless tobacco: Never Used  Substance Use Topics  . Alcohol use: No  . Drug use: No     Family Hx: The patient's family history includes Heart disease in his mother; Hyperlipidemia in his father; Hypertension in his father.  ROS:   Please see the history of present illness.    All other systems reviewed and are negative.   Prior CV studies:   The following studies were reviewed today:  Echocardiogram April 25, 2017 Study Conclusions  - Left ventricle: The cavity size was normal. Wall thickness was   increased in a pattern of severe LVH. Systolic function was   normal. The estimated ejection fraction was in the range of 55%   to 60%. The study is not technically sufficient to allow   evaluation of LV diastolic function. - Mitral valve: There was mild regurgitation. - Left atrium: The atrium was moderately dilated. - Pericardium, extracardiac: A trivial pericardial effusion was   identified posterior to the heart.   Labs/Other Tests and Data Reviewed:    EKG:  An ECG dated 07/08/2018 was personally reviewed today and demonstrated:  Normal sinus rhythm rate of 89.  Possible left atrial enlargement, right bundle branch block, possible lateral infarct, age undetermined, inferior infarct, age undetermined  Recent Labs: 02/14/2019: BUN 50; Creatinine, Ser 9.60; Hemoglobin 10.5; Potassium 4.5; Sodium 137   Recent Lipid Panel No results found for: CHOL, TRIG, HDL, CHOLHDL, LDLCALC, LDLDIRECT  Wt Readings from Last 3 Encounters:  03/02/19 (!) 317 lb 14.4 oz (144.2 kg)  02/14/19 (!) 310 lb (140.6 kg)  01/26/19 (!) 319 lb 4.8 oz (144.8 kg)     Objective:    Vital Signs:  There were no vitals taken for this visit.   Patient has normal speech pattern.  No evidence of cough, dyspnea, or wheezing noted.  ASSESSMENT & PLAN:      1. Atrial fibrillation, chronic ([place]) Patient states his resting heart rate is usually in the 80s or 90s.  Sometimes when  active heart rate goes above 100 but he feels no palpitations, skipped beats or fluttering sensation.  Continue Eliquis 5 mg p.o. twice daily.  2. ESRD on dialysis [facility])  Recently had a surgical revision on his AV fistula.  States it is working fine now.  Continues on dialysis and maintaining his dry weight.  States he is trying to lose weight to meet criteria for receiving a renal tree transplant in the future.  Patient is on the renal transplant list at [facility].  2. Chronic diastolic CHF (congestive heart failure) ([place]) Patient denies any recent acute shortness of breath, weight  gain, or lower extremity edema.  States he is doing well.  States his carvedilol dose was decreased from 6.25 mg down to 3.125 mg p.o. twice daily.  States his heart rate is maintaining in the 80s to 90s at rest.  Most recent echocardiogram in April 2019 showed Wall thickness was  increased in a pattern of severe LVH. Systolic function was  normal.The estimated ejection fraction was in the range of 55% to 60%.  Mild MR, LA moderately dilated.  Continue carvedilol 3.125 p.o. twice daily.  5. Morbid obesity ([place]) Patient states he is trying to lose weight to satisfy renal transplant criteria at [facility].  Last recorded weight was 317 pounds in December 2020   COVID-19 Education: The signs and symptoms of COVID-19 were discussed with the patient and how to seek care for testing (follow up with PCP or arrange E-visit).  The importance of social distancing was discussed today.  Time:   Today, I have spent 15 minutes with the patient with telehealth technology discussing the above problems.     Medication Adjustments/Labs and Tests Ordered: Current medicines are reviewed at length with the patient today.  Concerns regarding medicines are outlined above.   Tests Ordered: No orders of the defined types were placed in this encounter.   Medication Changes: No orders of the  defined types were placed in this encounter.   Follow Up:  Either In Person or Virtual in 9 month(s)  Signed, Verta Ellen, NP  04/13/2019 12:17 PM    [place]

## 2019-04-13 NOTE — Patient Instructions (Signed)
Medication Instructions:  Your physician recommends that you continue on your current medications as directed. Please refer to the Current Medication list given to you today.  *If you need a refill on your cardiac medications before your next appointment, please call your pharmacy*  Lab Work: NONE ordered at this time of appointment   If you have labs (blood work) drawn today and your tests are completely normal, you will receive your results only by: [name] MyChart Message (if you have MyChart) OR . A paper copy in the mail If you have any lab test that is abnormal or we need to change your treatment, we will call you to review the results.  Testing/Procedures: NONE ordered at this time of appointment   Follow-Up: At [facility], you and your health needs are our priority.  As part of our continuing mission to provide you with exceptional heart care, we have created designated Provider Care Teams.  These Care Teams include your primary Cardiologist (physician) and Advanced Practice Providers (APPs -  Physician Assistants and Nurse Practitioners) who all work together to provide you with the care you need, when you need it.  Your next appointment:   12 month(s)  The format for your next appointment:   In Person  Provider:   K. Mali Hilty, MD  Other Instructions

## 2019-05-01 ENCOUNTER — Ambulatory Visit: Payer: BC Managed Care – PPO | Admitting: Podiatry

## 2019-05-01 ENCOUNTER — Encounter: Payer: Self-pay | Admitting: Podiatry

## 2019-05-01 ENCOUNTER — Other Ambulatory Visit: Payer: Self-pay

## 2019-05-01 DIAGNOSIS — M79675 Pain in left toe(s): Secondary | ICD-10-CM | POA: Diagnosis not present

## 2019-05-01 DIAGNOSIS — M79674 Pain in right toe(s): Secondary | ICD-10-CM

## 2019-05-01 DIAGNOSIS — E1142 Type 2 diabetes mellitus with diabetic polyneuropathy: Secondary | ICD-10-CM

## 2019-05-01 DIAGNOSIS — B351 Tinea unguium: Secondary | ICD-10-CM | POA: Diagnosis not present

## 2019-05-01 NOTE — Progress Notes (Signed)
Complaint:  Visit Type: Patient returns to my office for continued preventative foot care services. Complaint: Patient states" my nails have grown long and thick and become painful to walk and wear shoes" Patient has been diagnosed with DM with no foot complications. The patient presents for preventative foot care services.  Podiatric Exam: Vascular: dorsalis pedis  are palpable bilateral. Posterior tibial pulses are absent due to swelling.Capillary return is immediate. Temperature gradient is WNL. Skin turgor WNL  Sensorium: Normal Semmes Weinstein monofilament test. Normal tactile sensation bilaterally. Nail Exam: Pt has thick disfigured discolored nails with subungual debris noted bilateral entire nail hallux through fifth toenails Ulcer Exam: There is no evidence of ulcer or pre-ulcerative changes or infection. Orthopedic Exam: Muscle tone and strength are WNL. No limitations in general ROM. No crepitus or effusions noted. Foot type and digits show no abnormalities. Bony prominences are unremarkable. Skin: No Porokeratosis. No infection or ulcers.  Asymptomatic  HD fifth toe right foot.  Diagnosis:  Onychomycosis, , Pain in right toe, pain in left toes  Treatment & Plan Procedures and Treatment: Consent by patient was obtained for treatment procedures.   Debridement of mycotic and hypertrophic toenails, 1 through 5 bilateral and clearing of subungual debris. No ulceration, no infection noted.  Return Visit-Office Procedure: Patient instructed to return to the office for a follow up visit 3 months for continued evaluation and treatment.    Gardiner Barefoot DPM

## 2019-05-31 NOTE — Telephone Encounter (Signed)
error 

## 2019-06-16 IMAGING — DX DG KNEE 1-2V*L*
2 series · 2 of 2 positions shown · non-contrast
Comparison: 03/06/2017

CLINICAL DATA: Rheumatoid arthritis, knee pain, swelling, obesity

EXAM:
LEFT KNEE - 1-2 VIEW

[knee ap]
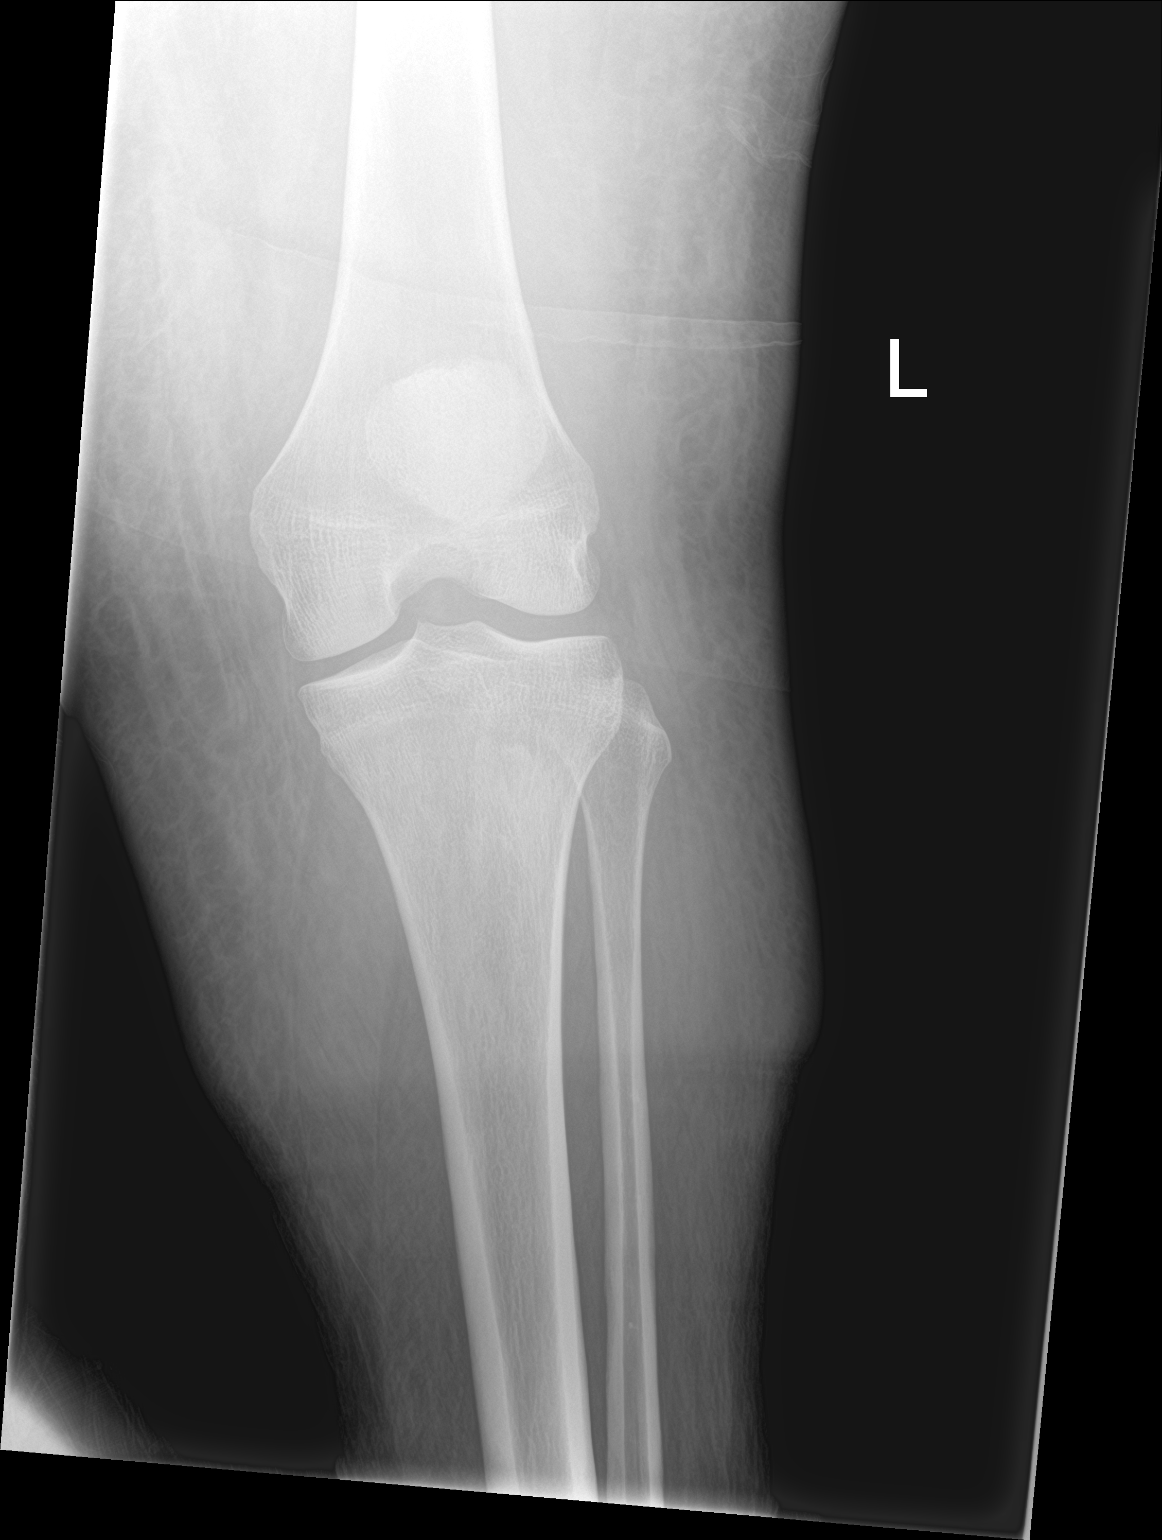

[knee lat]
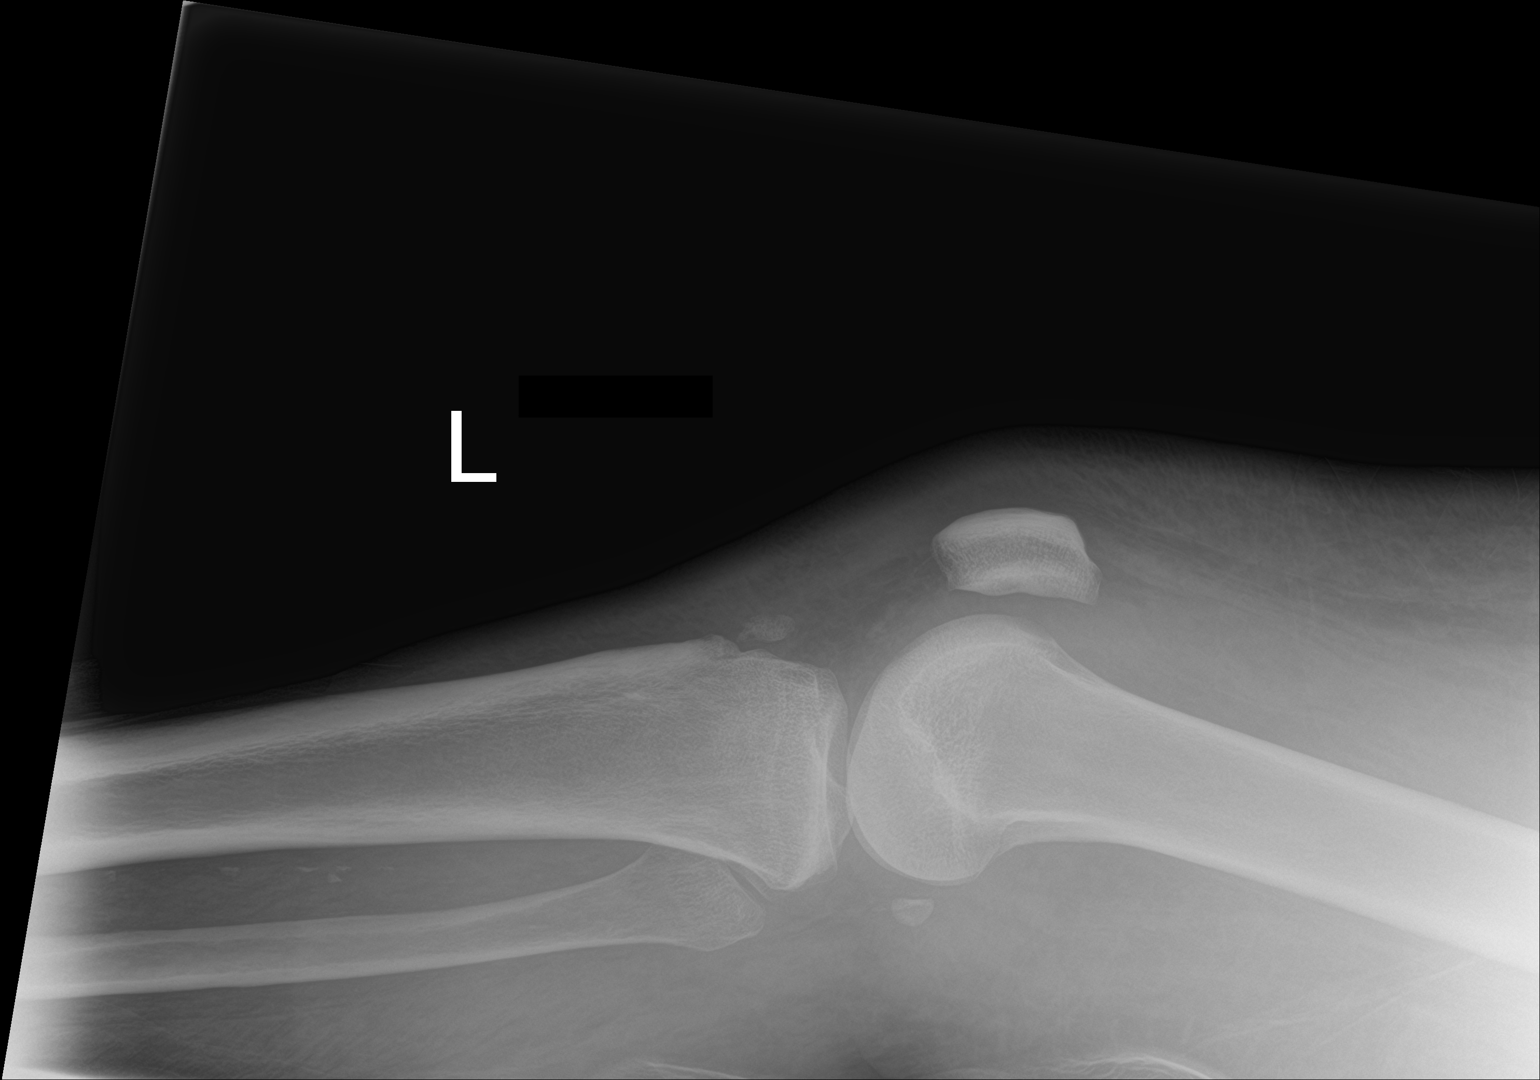

[2 of 2 positions shown; findings below may reference images not displayed]

FINDINGS: Left extremity subcutaneous edema noted. No acute osseous finding,
fracture, or malalignment. No effusion. No significant arthropathy
or joint effusion.
IMPRESSION: Left lower extremity subcutaneous edema.

No acute osseous finding or significant arthropathy by plain
radiography

## 2019-06-18 IMAGING — DX DG CHEST 1V PORT
1 series · 1 of 1 positions shown · non-contrast
Comparison: Chest x-ray of [REDACTED] 5922

CLINICAL DATA: Status post right central line placement. Shortness
of breath. Chronic CHF, morbid obesity.

EXAM:
PORTABLE CHEST 1 VIEW

[chest ap]
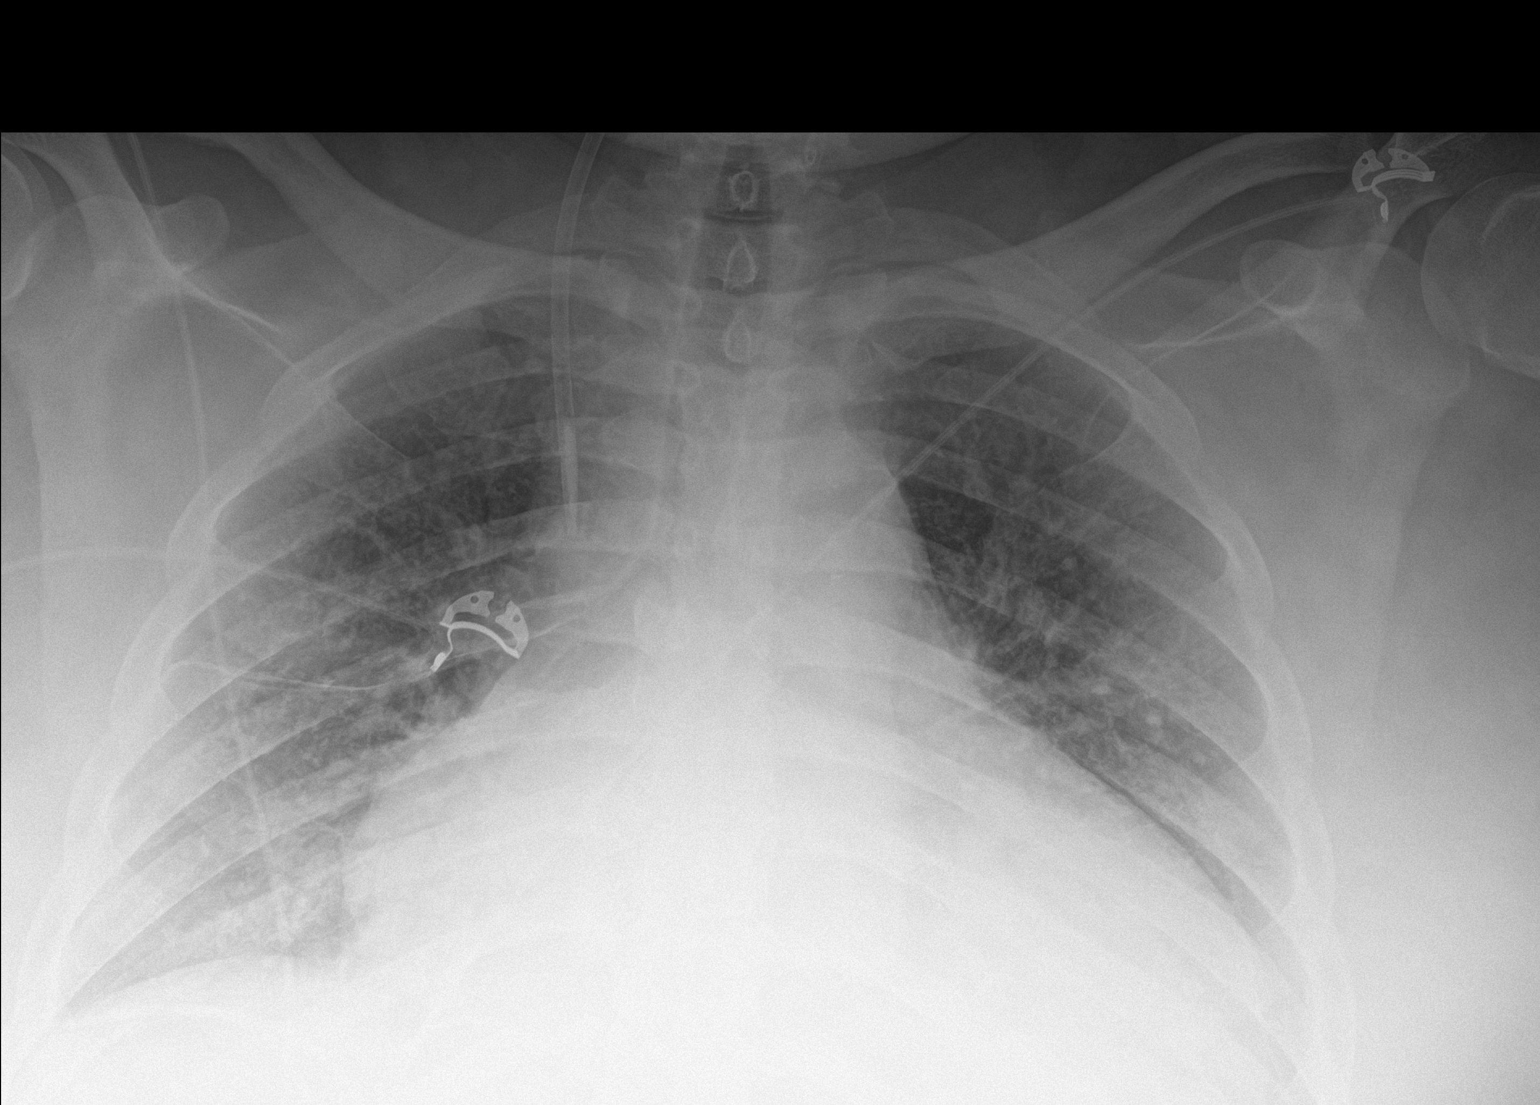

[1 of 1 positions shown; findings below may reference images not displayed]

FINDINGS: There has been interval placement of a right internal jugular venous
catheter. The tip of the catheter projects over the proximal SVC.
There is no postplacement pneumothorax. The cardiac silhouette
remains enlarged. The pulmonary vascularity remains engorged and the
interstitial markings increased. The left hemidiaphragm is obscured.
IMPRESSION: No postprocedure complication following right internal jugular
venous catheter placement. Cardiomegaly with mild pulmonary
interstitial edema consistent with CHF.

## 2019-07-04 IMAGING — DX DG KNEE COMPLETE 4+V*L*
4 series · 4 of 4 positions shown · non-contrast
Comparison: 05/14/2017

CLINICAL DATA: Bilateral knee pain, rheumatoid arthritis

EXAM:
LEFT KNEE - COMPLETE 4+ VIEW

[knee ap]
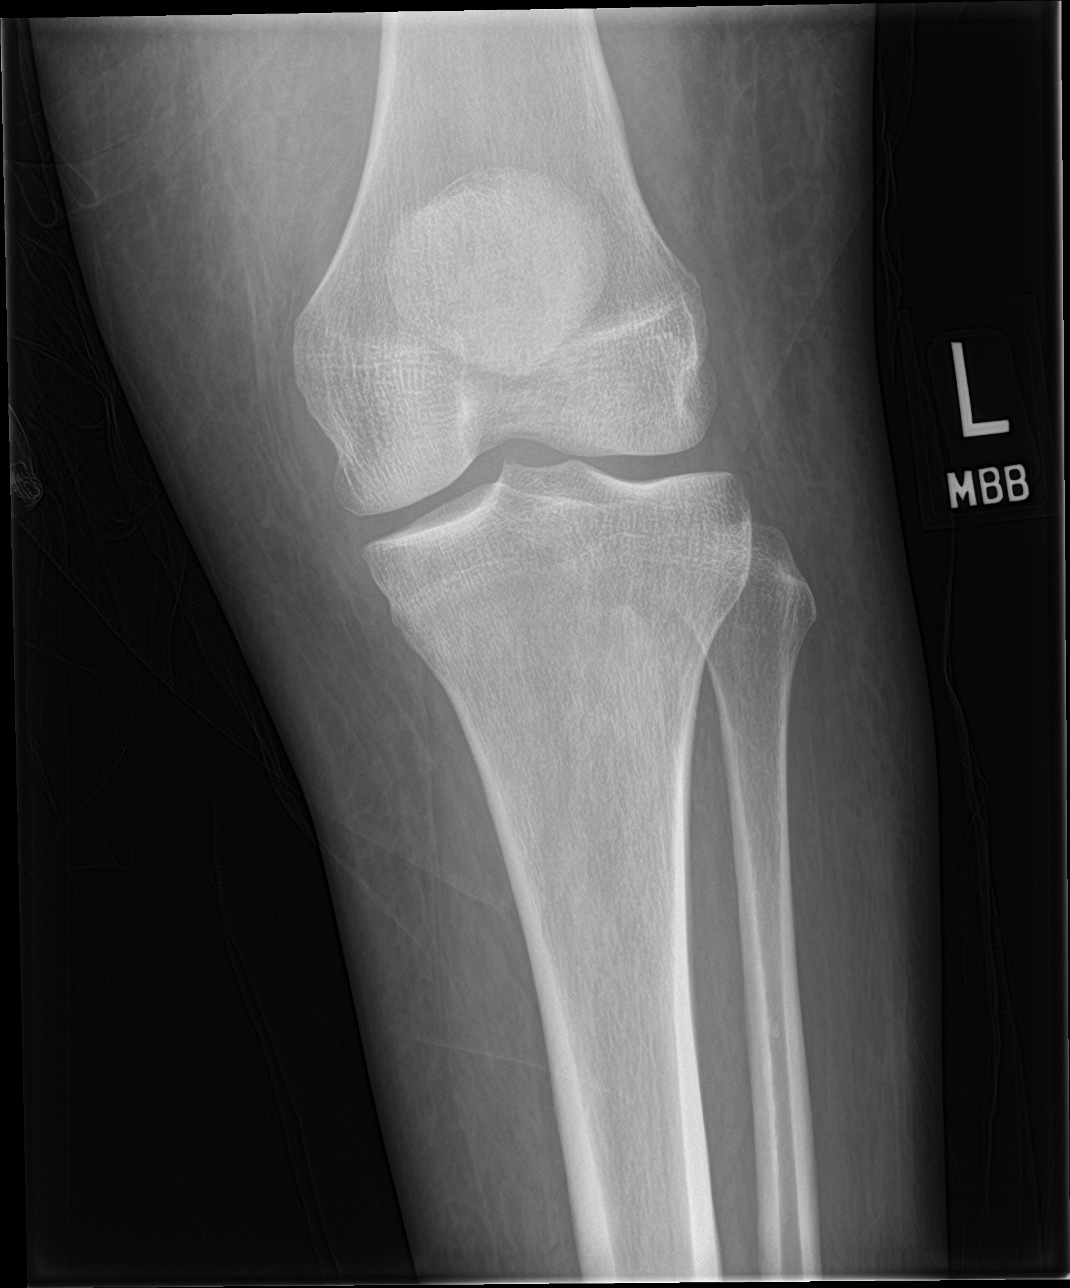

[knee lat]
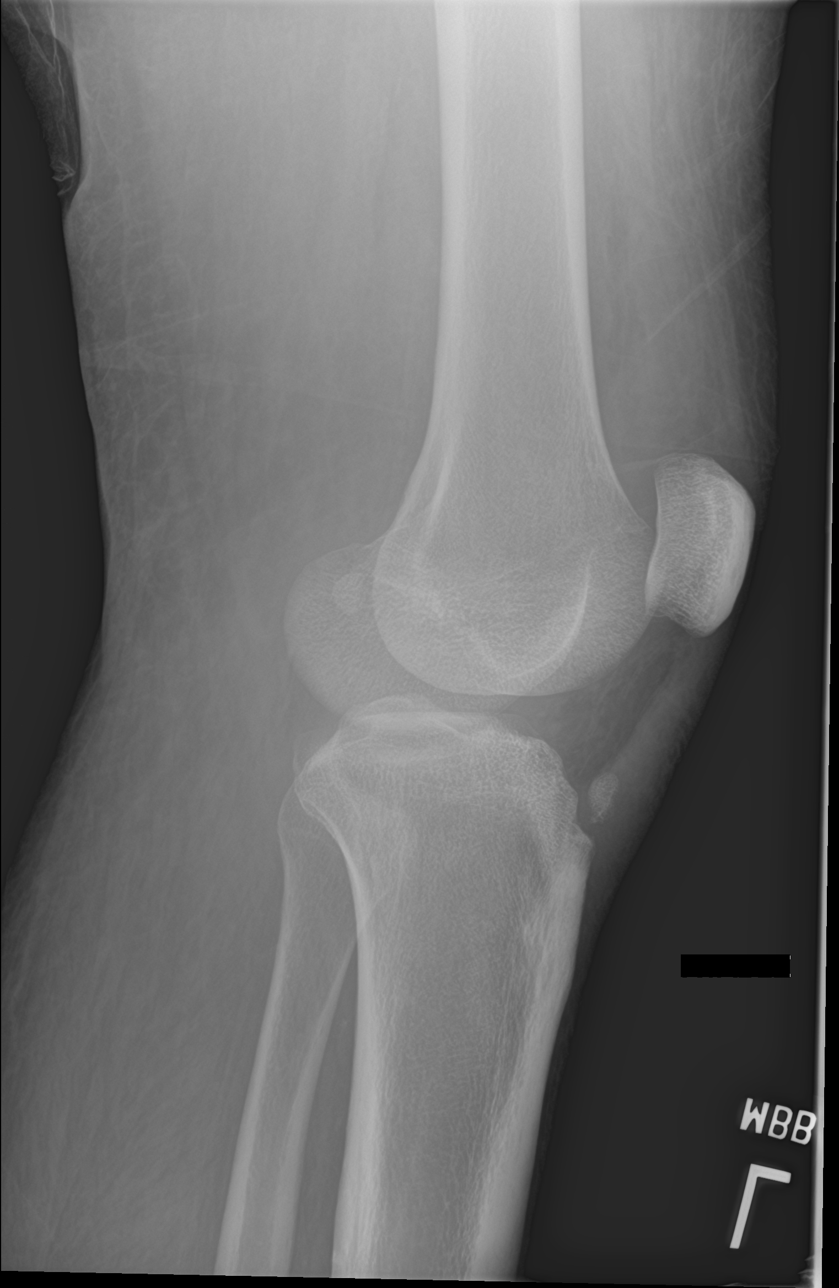

[knee obl (1 of 2)]
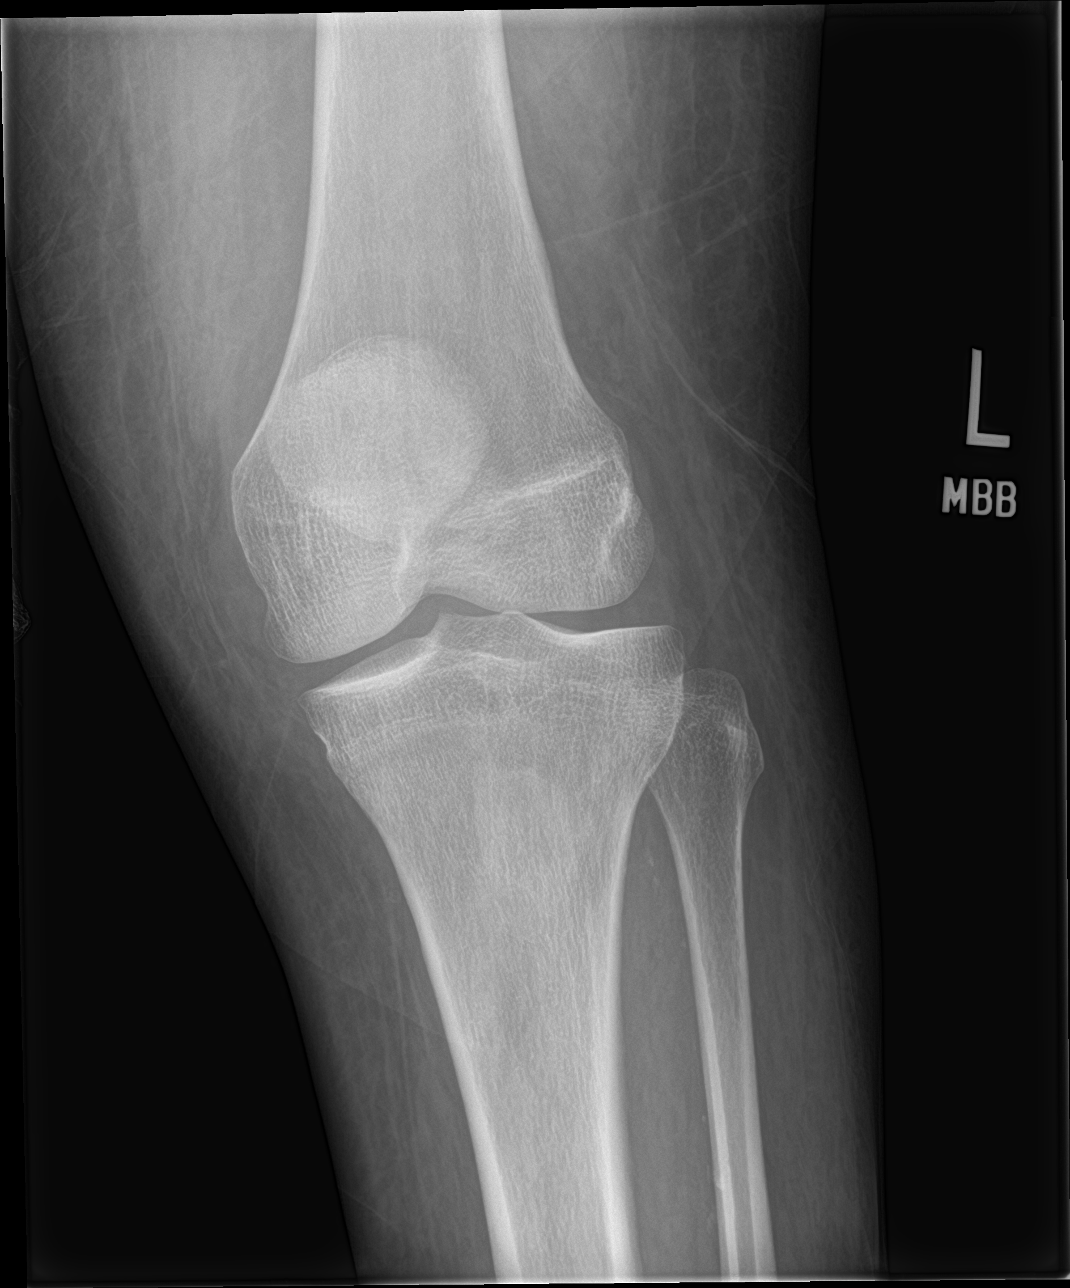

[knee obl (2 of 2)]
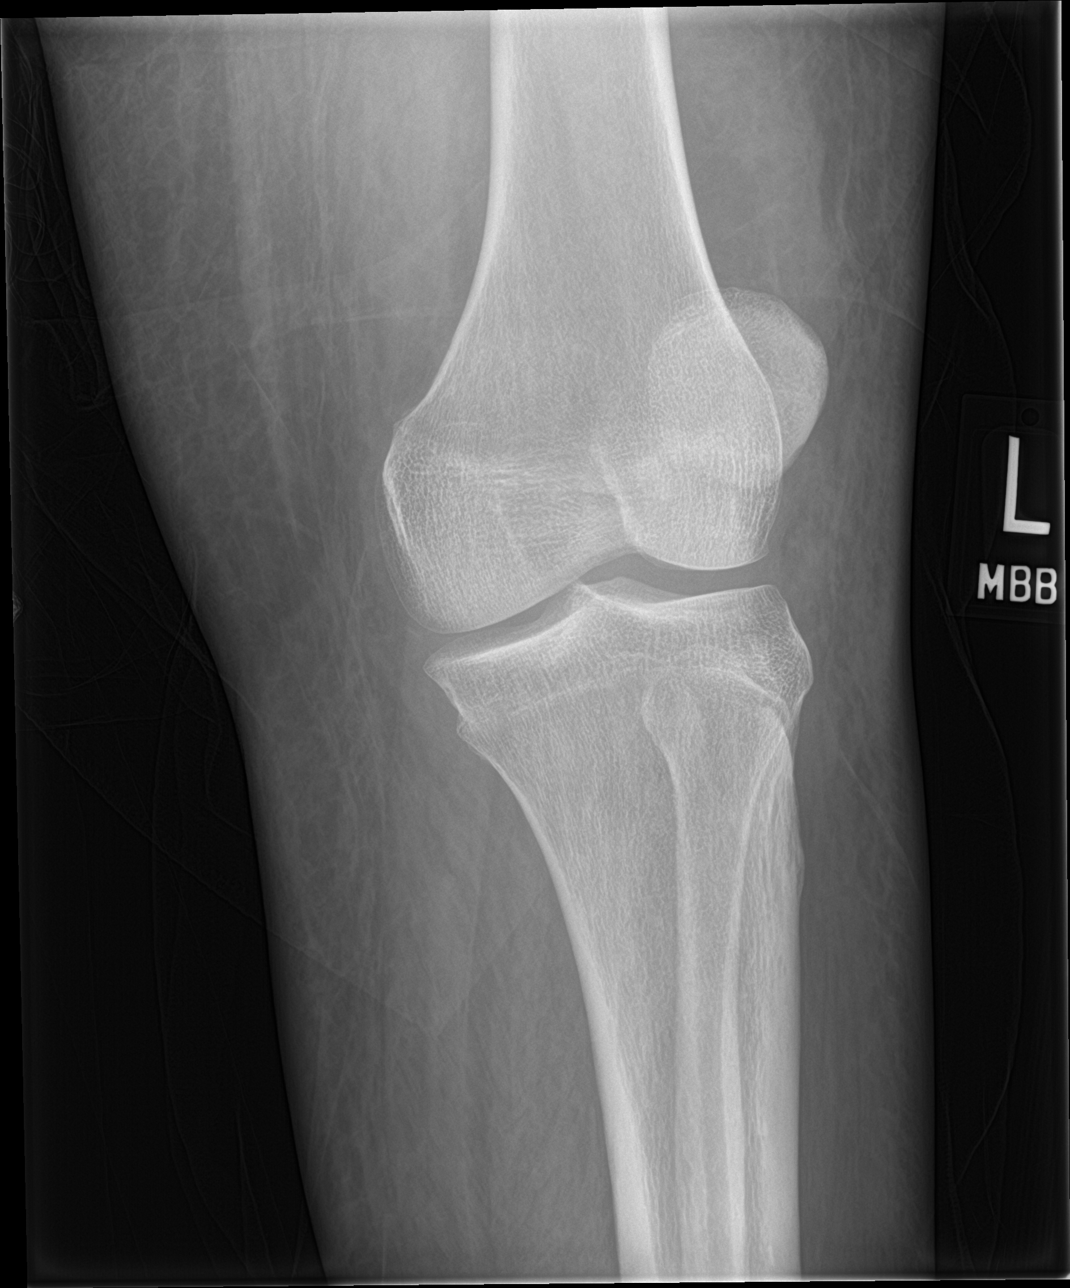

[4 of 4 positions shown; findings below may reference images not displayed]

FINDINGS: No evidence of fracture, dislocation, or joint effusion. No evidence
of arthropathy or other focal bone abnormality. Soft tissues are
unremarkable.
IMPRESSION: Negative.

## 2019-07-04 IMAGING — MR MR KNEE*R* W/O CM
4 of 5 series · 19 of 40 positions shown · non-contrast
Comparison: Radiographs 05/14/2017

CLINICAL DATA: Right knee pain and swelling.  No time course given.

EXAM:
MRI OF THE RIGHT KNEE WITHOUT CONTRAST
TECHNIQUE: Multiplanar, multisequence MR imaging of the knee was performed. No
intravenous contrast was administered.

[Series 7: PD fat-sat · axial · 3.0mm · 0.35mm/px · z∈[-154,+3]mm · 9 of 46 slices shown (1 of 3)]
[im 1/46]
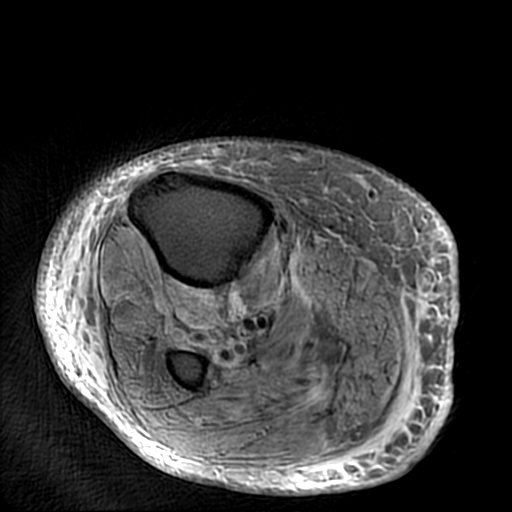
[im 6/46]
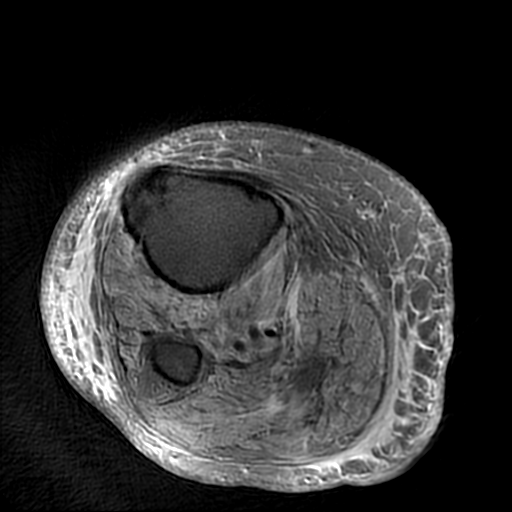
[im 12/46]
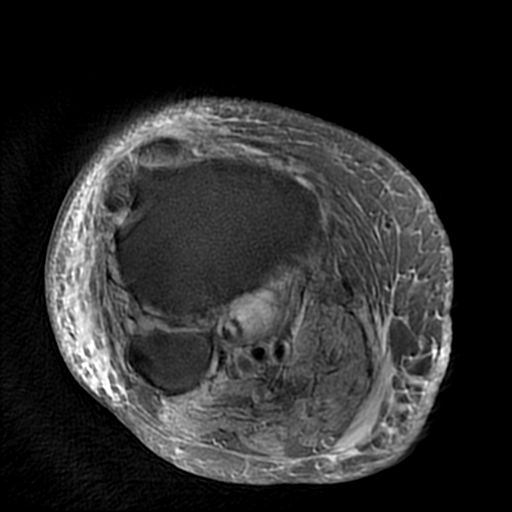
[im 17/46]
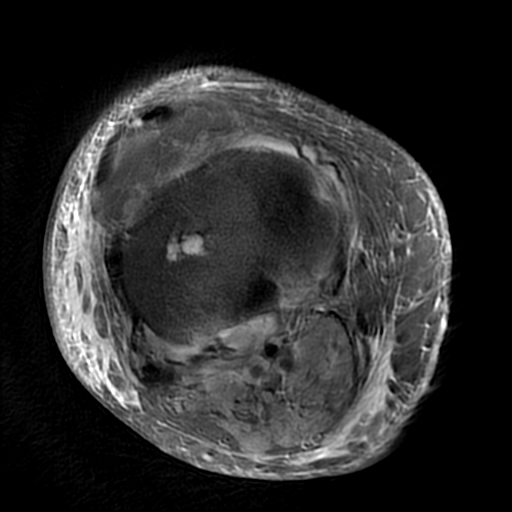
[im 23/46]
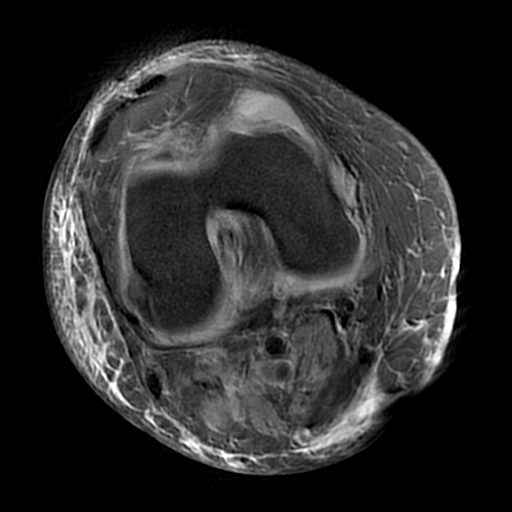
[im 29/46]
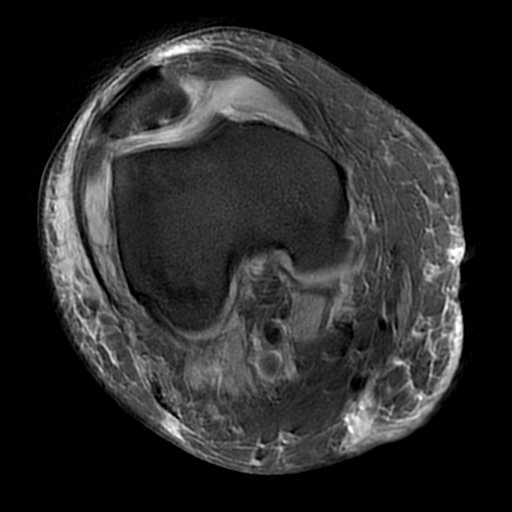
[im 34/46]
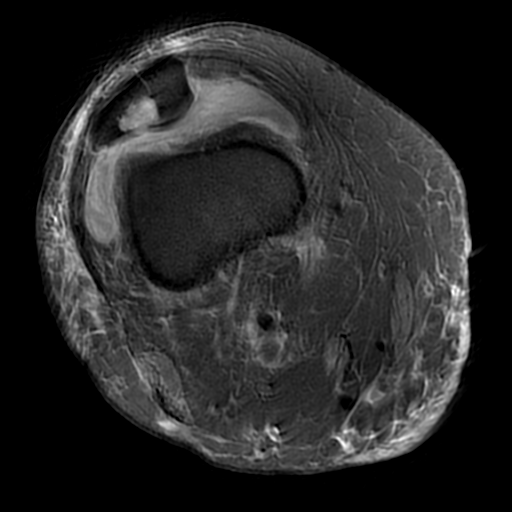
[im 40/46]
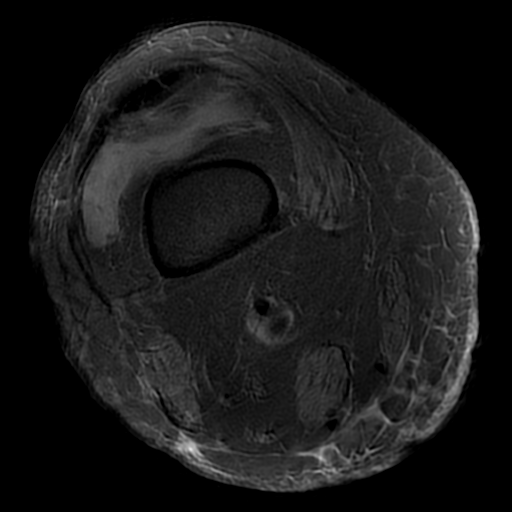
[im 46/46]
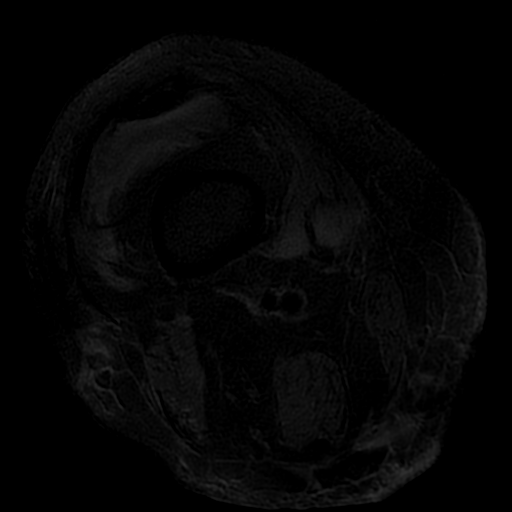

[Series 9: T2 fat-sat · coronal · 3.0mm · 0.35mm/px · 3 of 38 slices shown]
[im 6/38]
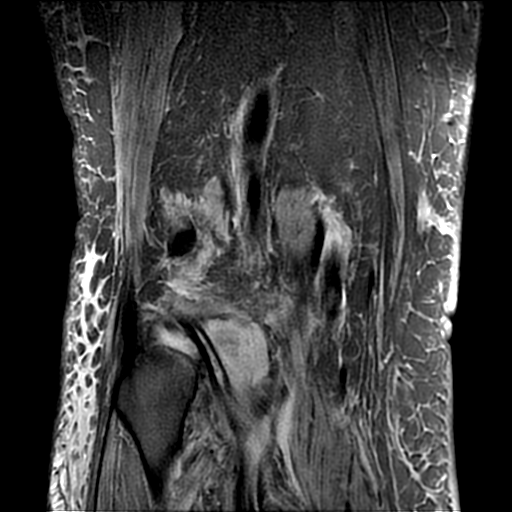
[im 22/38]
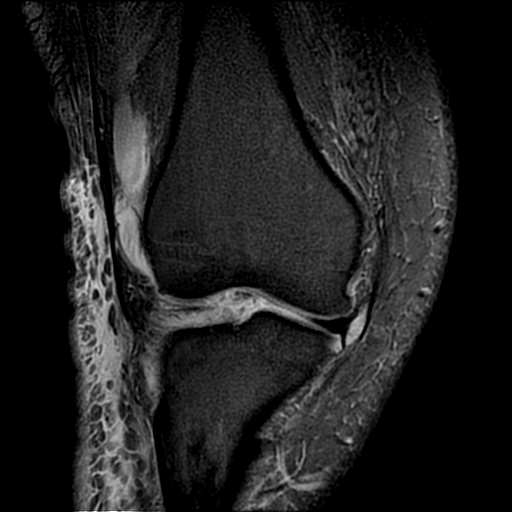
[im 32/38]
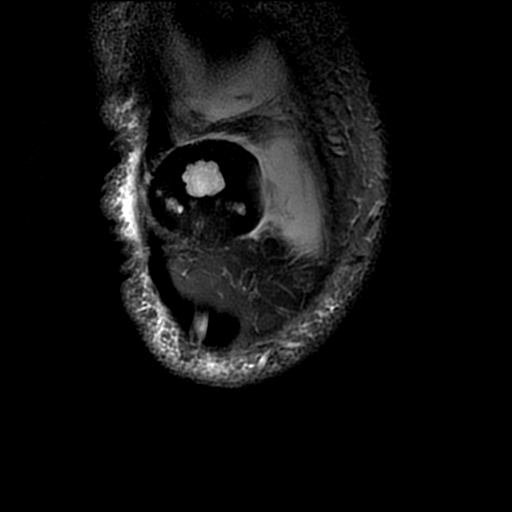

[Series 10: PD fat-sat · sagittal · 3.0mm · 0.35mm/px · 4 of 34 slices shown (2 of 3)]
[im 1/34]
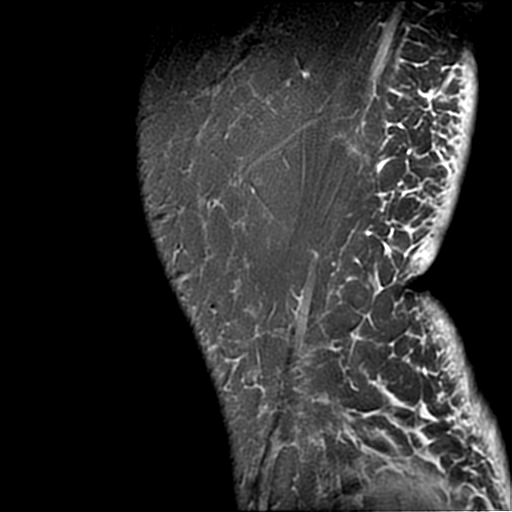
[im 6/34]
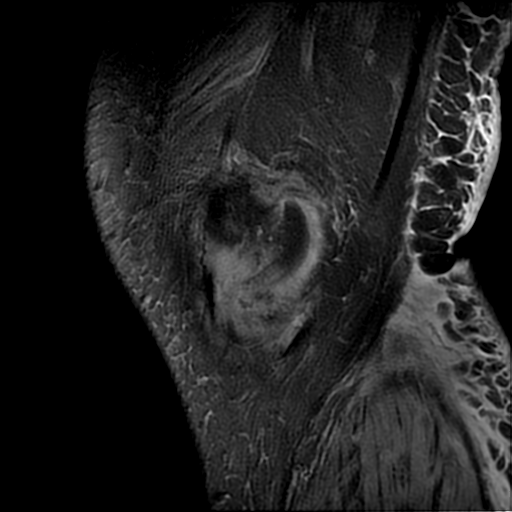
[im 17/34]
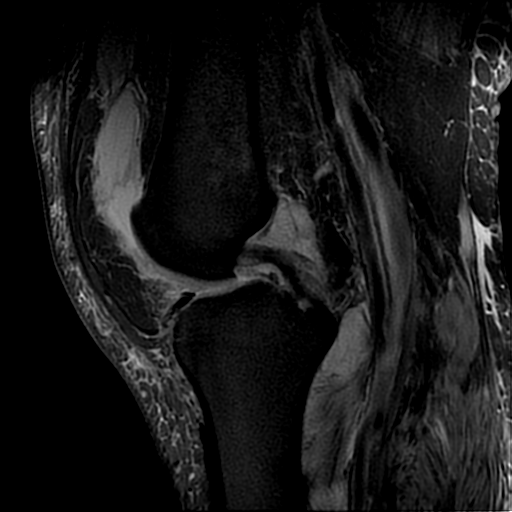
[im 28/34]
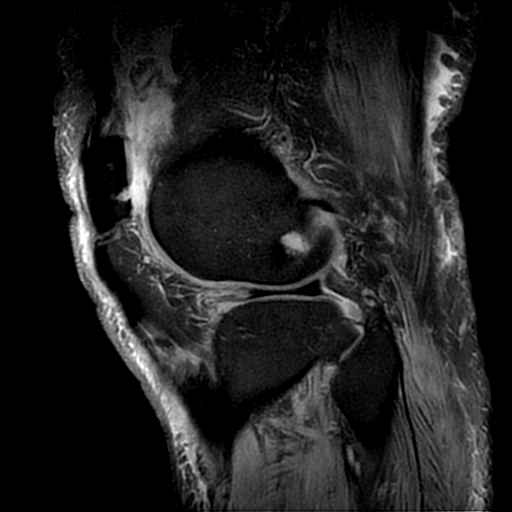

[Series 11: PD fat-sat · coronal · 3.0mm · 0.35mm/px · 3 of 38 slices shown (3 of 3)]
[im 6/38]
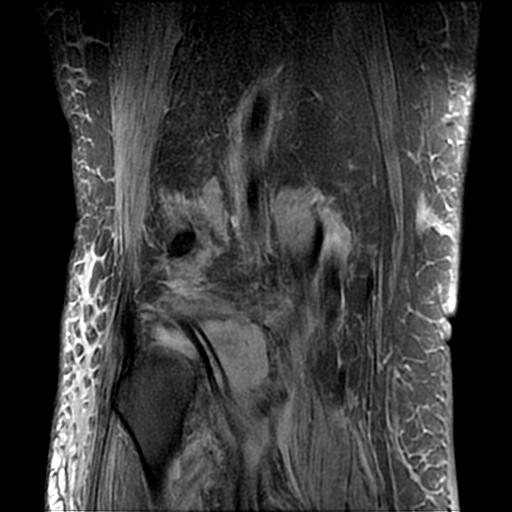
[im 22/38]
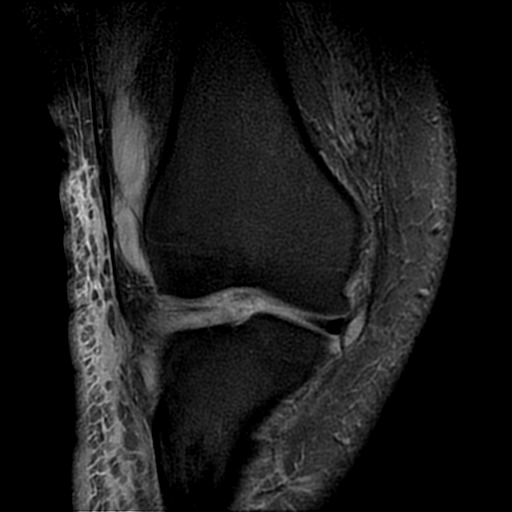
[im 32/38]
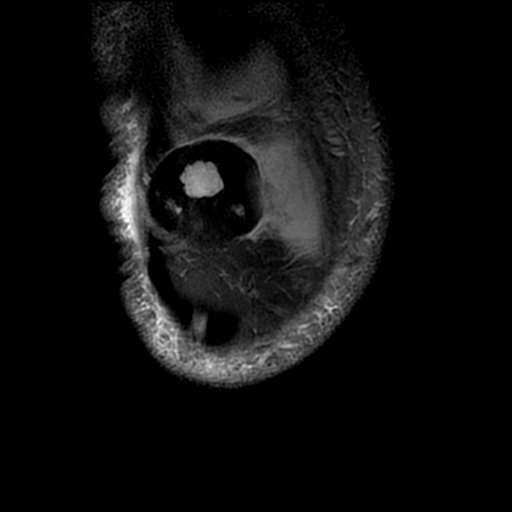

[19 of 40 positions shown; findings below may reference images not displayed]

FINDINGS: MENISCI

Medial meniscus:  Intact

Lateral meniscus:  Peripheral anterior horn tear.

LIGAMENTS

Cruciates: Intact. Moderate mucoid degeneration of the ACL and PCL.

Collaterals:  Intact.  MCL and pes anserine bursitis.

CARTILAGE

Patellofemoral: Markedly age advanced degenerative chondrosis with
areas of full-thickness cartilage loss and subchondral cystic change
(grade 4 chondromalacia).

Medial: Moderate degenerative chondrosis and subchondral cystic
changes.

Lateral: Moderate degenerative chondrosis and subchondral cystic
changes.

Joint: Large joint effusion and moderate synovitis. Superior
patellar plica are noted.

Popliteal Fossa:  No popliteal mass or Baker's cyst.

Extensor Mechanism: The patella retinacular structures are intact
and the quadriceps and patellar tendons are intact. There is a
longitudinal split type tear involving the patellar tendon and
significant distal tendinopathy.

Bones:  No acute bony findings.

Other: Normal knee musculature.
IMPRESSION: 1. Peripheral tear involving the anterior horn of the lateral
meniscus.
2. Mucoid degeneration of the ACL and PCL.
3. Age advanced tricompartmental degenerative changes most
significantly involving the patellofemoral joint.
4. Longitudinal split type tear involving the patellar tendon and
significant distal tendinopathy.
5. Large joint effusion and moderate synovitis.

## 2019-07-13 ENCOUNTER — Emergency Department (HOSPITAL_COMMUNITY): Payer: BC Managed Care – PPO

## 2019-07-13 ENCOUNTER — Inpatient Hospital Stay (HOSPITAL_COMMUNITY)
Admission: EM | Admit: 2019-07-13 | Discharge: 2019-08-15 | DRG: 296 | Disposition: E | Payer: BC Managed Care – PPO | Attending: General Surgery | Admitting: General Surgery

## 2019-07-13 ENCOUNTER — Inpatient Hospital Stay (HOSPITAL_COMMUNITY): Payer: BC Managed Care – PPO

## 2019-07-13 ENCOUNTER — Encounter (HOSPITAL_COMMUNITY): Payer: Self-pay | Admitting: General Surgery

## 2019-07-13 ENCOUNTER — Other Ambulatory Visit: Payer: Self-pay

## 2019-07-13 DIAGNOSIS — N186 End stage renal disease: Secondary | ICD-10-CM

## 2019-07-13 DIAGNOSIS — E875 Hyperkalemia: Secondary | ICD-10-CM

## 2019-07-13 DIAGNOSIS — G931 Anoxic brain damage, not elsewhere classified: Secondary | ICD-10-CM

## 2019-07-13 DIAGNOSIS — Z4659 Encounter for fitting and adjustment of other gastrointestinal appliance and device: Secondary | ICD-10-CM

## 2019-07-13 DIAGNOSIS — I5030 Unspecified diastolic (congestive) heart failure: Secondary | ICD-10-CM

## 2019-07-13 DIAGNOSIS — Z7189 Other specified counseling: Secondary | ICD-10-CM

## 2019-07-13 DIAGNOSIS — J939 Pneumothorax, unspecified: Secondary | ICD-10-CM

## 2019-07-13 DIAGNOSIS — J9690 Respiratory failure, unspecified, unspecified whether with hypoxia or hypercapnia: Secondary | ICD-10-CM

## 2019-07-13 DIAGNOSIS — I469 Cardiac arrest, cause unspecified: Secondary | ICD-10-CM

## 2019-07-13 DIAGNOSIS — Z515 Encounter for palliative care: Secondary | ICD-10-CM

## 2019-07-13 DIAGNOSIS — S2239XA Fracture of one rib, unspecified side, initial encounter for closed fracture: Secondary | ICD-10-CM | POA: Diagnosis present

## 2019-07-13 DIAGNOSIS — R140 Abdominal distension (gaseous): Secondary | ICD-10-CM

## 2019-07-13 DIAGNOSIS — T1490XA Injury, unspecified, initial encounter: Principal | ICD-10-CM

## 2019-07-13 DIAGNOSIS — W230XXA Caught, crushed, jammed, or pinched between moving objects, initial encounter: Secondary | ICD-10-CM | POA: Diagnosis present

## 2019-07-13 DIAGNOSIS — E1142 Type 2 diabetes mellitus with diabetic polyneuropathy: Secondary | ICD-10-CM | POA: Diagnosis present

## 2019-07-13 DIAGNOSIS — S270XXA Traumatic pneumothorax, initial encounter: Secondary | ICD-10-CM | POA: Diagnosis present

## 2019-07-13 DIAGNOSIS — S2243XA Multiple fractures of ribs, bilateral, initial encounter for closed fracture: Secondary | ICD-10-CM | POA: Diagnosis present

## 2019-07-13 DIAGNOSIS — I4891 Unspecified atrial fibrillation: Secondary | ICD-10-CM | POA: Diagnosis present

## 2019-07-13 DIAGNOSIS — T2123XA Burn of second degree of upper back, initial encounter: Secondary | ICD-10-CM | POA: Diagnosis present

## 2019-07-13 DIAGNOSIS — I4510 Unspecified right bundle-branch block: Secondary | ICD-10-CM | POA: Diagnosis present

## 2019-07-13 DIAGNOSIS — N2581 Secondary hyperparathyroidism of renal origin: Secondary | ICD-10-CM | POA: Diagnosis present

## 2019-07-13 DIAGNOSIS — D631 Anemia in chronic kidney disease: Secondary | ICD-10-CM | POA: Diagnosis present

## 2019-07-13 DIAGNOSIS — I5032 Chronic diastolic (congestive) heart failure: Secondary | ICD-10-CM | POA: Diagnosis present

## 2019-07-13 DIAGNOSIS — Z8249 Family history of ischemic heart disease and other diseases of the circulatory system: Secondary | ICD-10-CM

## 2019-07-13 DIAGNOSIS — Z7901 Long term (current) use of anticoagulants: Secondary | ICD-10-CM

## 2019-07-13 DIAGNOSIS — T82868A Thrombosis of vascular prosthetic devices, implants and grafts, initial encounter: Secondary | ICD-10-CM | POA: Diagnosis present

## 2019-07-13 DIAGNOSIS — Z992 Dependence on renal dialysis: Secondary | ICD-10-CM

## 2019-07-13 DIAGNOSIS — R571 Hypovolemic shock: Secondary | ICD-10-CM | POA: Diagnosis not present

## 2019-07-13 DIAGNOSIS — J155 Pneumonia due to Escherichia coli: Secondary | ICD-10-CM | POA: Diagnosis not present

## 2019-07-13 DIAGNOSIS — Z66 Do not resuscitate: Secondary | ICD-10-CM | POA: Diagnosis not present

## 2019-07-13 DIAGNOSIS — J9600 Acute respiratory failure, unspecified whether with hypoxia or hypercapnia: Secondary | ICD-10-CM | POA: Diagnosis not present

## 2019-07-13 DIAGNOSIS — I472 Ventricular tachycardia: Secondary | ICD-10-CM | POA: Diagnosis present

## 2019-07-13 DIAGNOSIS — N179 Acute kidney failure, unspecified: Secondary | ICD-10-CM | POA: Diagnosis present

## 2019-07-13 DIAGNOSIS — K567 Ileus, unspecified: Secondary | ICD-10-CM | POA: Diagnosis not present

## 2019-07-13 DIAGNOSIS — J9601 Acute respiratory failure with hypoxia: Secondary | ICD-10-CM | POA: Diagnosis present

## 2019-07-13 DIAGNOSIS — R579 Shock, unspecified: Secondary | ICD-10-CM | POA: Diagnosis not present

## 2019-07-13 DIAGNOSIS — Y832 Surgical operation with anastomosis, bypass or graft as the cause of abnormal reaction of the patient, or of later complication, without mention of misadventure at the time of the procedure: Secondary | ICD-10-CM | POA: Diagnosis present

## 2019-07-13 DIAGNOSIS — Z20822 Contact with and (suspected) exposure to covid-19: Secondary | ICD-10-CM | POA: Diagnosis present

## 2019-07-13 DIAGNOSIS — Z6841 Body Mass Index (BMI) 40.0 and over, adult: Secondary | ICD-10-CM | POA: Diagnosis not present

## 2019-07-13 DIAGNOSIS — S2249XA Multiple fractures of ribs, unspecified side, initial encounter for closed fracture: Secondary | ICD-10-CM | POA: Diagnosis present

## 2019-07-13 DIAGNOSIS — R680 Hypothermia, not associated with low environmental temperature: Secondary | ICD-10-CM | POA: Diagnosis present

## 2019-07-13 DIAGNOSIS — E1165 Type 2 diabetes mellitus with hyperglycemia: Secondary | ICD-10-CM | POA: Diagnosis not present

## 2019-07-13 DIAGNOSIS — E1151 Type 2 diabetes mellitus with diabetic peripheral angiopathy without gangrene: Secondary | ICD-10-CM | POA: Diagnosis present

## 2019-07-13 DIAGNOSIS — I132 Hypertensive heart and chronic kidney disease with heart failure and with stage 5 chronic kidney disease, or end stage renal disease: Secondary | ICD-10-CM | POA: Diagnosis present

## 2019-07-13 DIAGNOSIS — Y93H2 Activity, gardening and landscaping: Secondary | ICD-10-CM

## 2019-07-13 DIAGNOSIS — E8889 Other specified metabolic disorders: Secondary | ICD-10-CM | POA: Diagnosis present

## 2019-07-13 DIAGNOSIS — E1122 Type 2 diabetes mellitus with diabetic chronic kidney disease: Secondary | ICD-10-CM | POA: Diagnosis present

## 2019-07-13 DIAGNOSIS — M069 Rheumatoid arthritis, unspecified: Secondary | ICD-10-CM | POA: Diagnosis present

## 2019-07-13 HISTORY — DX: End stage renal disease: N18.6

## 2019-07-13 HISTORY — DX: Polyneuropathy, unspecified: G62.9

## 2019-07-13 LAB — I-STAT CHEM 8, ED
BUN: 73 mg/dL — ABNORMAL HIGH (ref 6–20)
Calcium, Ion: 0.96 mmol/L — ABNORMAL LOW (ref 1.15–1.40)
Chloride: 98 mmol/L (ref 98–111)
Creatinine, Ser: 13 mg/dL — ABNORMAL HIGH (ref 0.61–1.24)
Glucose, Bld: 257 mg/dL — ABNORMAL HIGH (ref 70–99)
HCT: 39 % (ref 39.0–52.0)
Hemoglobin: 13.3 g/dL (ref 13.0–17.0)
Potassium: 6.5 mmol/L (ref 3.5–5.1)
Sodium: 134 mmol/L — ABNORMAL LOW (ref 135–145)
TCO2: 21 mmol/L — ABNORMAL LOW (ref 22–32)

## 2019-07-13 LAB — CBC
HCT: 38.8 % — ABNORMAL LOW (ref 39.0–52.0)
Hemoglobin: 11.7 g/dL — ABNORMAL LOW (ref 13.0–17.0)
MCH: 33.7 pg (ref 26.0–34.0)
MCHC: 30.2 g/dL (ref 30.0–36.0)
MCV: 111.8 fL — ABNORMAL HIGH (ref 80.0–100.0)
Platelets: 291 10*3/uL (ref 150–400)
RBC: 3.47 MIL/uL — ABNORMAL LOW (ref 4.22–5.81)
RDW: 14.5 % (ref 11.5–15.5)
WBC: 18.7 10*3/uL — ABNORMAL HIGH (ref 4.0–10.5)
nRBC: 0.1 % (ref 0.0–0.2)

## 2019-07-13 LAB — POCT I-STAT 7, (LYTES, BLD GAS, ICA,H+H)
Acid-base deficit: 2 mmol/L (ref 0.0–2.0)
Acid-base deficit: 1 mmol/L (ref 0.0–2.0)
Bicarbonate: 26.1 mmol/L (ref 20.0–28.0)
Bicarbonate: 25.1 mmol/L (ref 20.0–28.0)
Calcium, Ion: 0.99 mmol/L — ABNORMAL LOW (ref 1.15–1.40)
Calcium, Ion: 1.03 mmol/L — ABNORMAL LOW (ref 1.15–1.40)
HCT: 30 % — ABNORMAL LOW (ref 39.0–52.0)
HCT: 32 % — ABNORMAL LOW (ref 39.0–52.0)
Hemoglobin: 10.2 g/dL — ABNORMAL LOW (ref 13.0–17.0)
Hemoglobin: 10.9 g/dL — ABNORMAL LOW (ref 13.0–17.0)
O2 Saturation: 100 %
O2 Saturation: 98 %
Patient temperature: 98.6
Patient temperature: 98.6
Potassium: 6.7 mmol/L (ref 3.5–5.1)
Potassium: 4.9 mmol/L (ref 3.5–5.1)
Sodium: 131 mmol/L — ABNORMAL LOW (ref 135–145)
Sodium: 134 mmol/L — ABNORMAL LOW (ref 135–145)
TCO2: 28 mmol/L (ref 22–32)
TCO2: 26 mmol/L (ref 22–32)
pCO2 arterial: 63.6 mmHg — ABNORMAL HIGH (ref 32.0–48.0)
pCO2 arterial: 45.9 mmHg (ref 32.0–48.0)
pH, Arterial: 7.221 — ABNORMAL LOW (ref 7.350–7.450)
pH, Arterial: 7.346 — ABNORMAL LOW (ref 7.350–7.450)
pO2, Arterial: 406 mmHg — ABNORMAL HIGH (ref 83.0–108.0)
pO2, Arterial: 112 mmHg — ABNORMAL HIGH (ref 83.0–108.0)

## 2019-07-13 LAB — RESPIRATORY PANEL BY RT PCR (FLU A&B, COVID)
Influenza A by PCR: NEGATIVE
Influenza B by PCR: NEGATIVE
SARS Coronavirus 2 by RT PCR: NEGATIVE

## 2019-07-13 LAB — COMPREHENSIVE METABOLIC PANEL
ALT: 131 U/L — ABNORMAL HIGH (ref 0–44)
AST: 119 U/L — ABNORMAL HIGH (ref 15–41)
Albumin: 3.7 g/dL (ref 3.5–5.0)
Alkaline Phosphatase: 76 U/L (ref 38–126)
Anion gap: 26 — ABNORMAL HIGH (ref 5–15)
BUN: 55 mg/dL — ABNORMAL HIGH (ref 6–20)
CO2: 17 mmol/L — ABNORMAL LOW (ref 22–32)
Calcium: 8.6 mg/dL — ABNORMAL LOW (ref 8.9–10.3)
Chloride: 93 mmol/L — ABNORMAL LOW (ref 98–111)
Creatinine, Ser: 13.73 mg/dL — ABNORMAL HIGH (ref 0.61–1.24)
GFR calc Af Amer: 4 mL/min — ABNORMAL LOW (ref >60)
GFR calc non Af Amer: 4 mL/min — ABNORMAL LOW (ref >60)
Glucose, Bld: 344 mg/dL — ABNORMAL HIGH (ref 70–99)
Potassium: 6.5 mmol/L (ref 3.5–5.1)
Sodium: 136 mmol/L (ref 135–145)
Total Bilirubin: 0.6 mg/dL (ref 0.3–1.2)
Total Protein: 8.1 g/dL (ref 6.5–8.1)

## 2019-07-13 LAB — BASIC METABOLIC PANEL
Anion gap: 17 — ABNORMAL HIGH (ref 5–15)
BUN: 60 mg/dL — ABNORMAL HIGH (ref 6–20)
CO2: 23 mmol/L (ref 22–32)
Calcium: 8 mg/dL — ABNORMAL LOW (ref 8.9–10.3)
Chloride: 95 mmol/L — ABNORMAL LOW (ref 98–111)
Creatinine, Ser: 13.47 mg/dL — ABNORMAL HIGH (ref 0.61–1.24)
GFR calc Af Amer: 4 mL/min — ABNORMAL LOW (ref >60)
GFR calc non Af Amer: 4 mL/min — ABNORMAL LOW (ref >60)
Glucose, Bld: 362 mg/dL — ABNORMAL HIGH (ref 70–99)
Potassium: 5 mmol/L (ref 3.5–5.1)
Sodium: 135 mmol/L (ref 135–145)

## 2019-07-13 LAB — HEMOGLOBIN A1C
Hgb A1c MFr Bld: 8.4 % — ABNORMAL HIGH (ref 4.8–5.6)
Mean Plasma Glucose: 194.38 mg/dL

## 2019-07-13 LAB — SAMPLE TO BLOOD BANK

## 2019-07-13 LAB — GLUCOSE, CAPILLARY
Glucose-Capillary: 325 mg/dL — ABNORMAL HIGH (ref 70–99)
Glucose-Capillary: 253 mg/dL — ABNORMAL HIGH (ref 70–99)
Glucose-Capillary: 212 mg/dL — ABNORMAL HIGH (ref 70–99)

## 2019-07-13 LAB — ABO/RH: ABO/RH(D): A POS

## 2019-07-13 LAB — MRSA PCR SCREENING: MRSA by PCR: NEGATIVE

## 2019-07-13 LAB — LACTIC ACID, PLASMA: Lactic Acid, Venous: >11 mmol/L (ref 0.5–1.9)

## 2019-07-13 LAB — HIV ANTIBODY (ROUTINE TESTING W REFLEX): HIV Screen 4th Generation wRfx: NONREACTIVE

## 2019-07-13 LAB — PROTIME-INR
INR: 1.3 — ABNORMAL HIGH (ref 0.8–1.2)
Prothrombin Time: 16 seconds — ABNORMAL HIGH (ref 11.4–15.2)

## 2019-07-13 LAB — TRIGLYCERIDES: Triglycerides: 136 mg/dL (ref <150)

## 2019-07-13 LAB — ETHANOL: Alcohol, Ethyl (B): <10 mg/dL (ref <10)

## 2019-07-13 MED ORDER — DOCUSATE SODIUM 50 MG/5ML PO LIQD
100.0000 mg | Freq: Two times a day (BID) | ORAL | Status: DC
  Administered 2019-07-13 – 2019-07-16 (×7): 100 mg
  Filled 2019-07-13 (×8): qty 10

## 2019-07-13 MED ORDER — CHLORHEXIDINE GLUCONATE 0.12% ORAL RINSE (MEDLINE KIT)
15.0000 mL | Freq: Two times a day (BID) | OROMUCOSAL | Status: DC
  Administered 2019-07-13 – 2019-07-21 (×16): 15 mL via OROMUCOSAL

## 2019-07-13 MED ORDER — PROPOFOL 1000 MG/100ML IV EMUL
INTRAVENOUS | Status: AC
  Filled 2019-07-13: qty 100

## 2019-07-13 MED ORDER — PROPOFOL 1000 MG/100ML IV EMUL
0.0000 ug/kg/min | INTRAVENOUS | Status: DC
  Administered 2019-07-13: 20 ug/kg/min via INTRAVENOUS

## 2019-07-13 MED ORDER — DOCUSATE SODIUM 50 MG/5ML PO LIQD: 100.0000 mg | Freq: Two times a day (BID) | ORAL | Status: DC

## 2019-07-13 MED ORDER — POLYETHYLENE GLYCOL 3350 17 G PO PACK
17.0000 g | PACK | Freq: Every day | ORAL | Status: DC
  Administered 2019-07-14 – 2019-07-20 (×4): 17 g
  Filled 2019-07-13 (×4): qty 1

## 2019-07-13 MED ORDER — SODIUM CHLORIDE 0.9% FLUSH
10.0000 mL | Freq: Two times a day (BID) | INTRAVENOUS | Status: DC
  Administered 2019-07-13 – 2019-07-15 (×4): 10 mL
  Administered 2019-07-15 – 2019-07-16 (×2): 30 mL
  Administered 2019-07-16 – 2019-07-17 (×2): 10 mL
  Administered 2019-07-18 (×2): 20 mL
  Administered 2019-07-19 (×2): 10 mL
  Administered 2019-07-20: 30 mL
  Administered 2019-07-20: 10 mL

## 2019-07-13 MED ORDER — FENTANYL CITRATE (PF) 100 MCG/2ML IJ SOLN
50.0000 ug | Freq: Once | INTRAMUSCULAR | Status: DC
  Filled 2019-07-13: qty 2

## 2019-07-13 MED ORDER — SUCCINYLCHOLINE CHLORIDE 20 MG/ML IJ SOLN
INTRAMUSCULAR | Status: AC | PRN
  Administered 2019-07-13: 100 mg via INTRAVENOUS

## 2019-07-13 MED ORDER — PROPOFOL 1000 MG/100ML IV EMUL
0.0000 ug/kg/min | INTRAVENOUS | Status: DC
  Administered 2019-07-13 – 2019-07-14 (×2): 10 ug/kg/min via INTRAVENOUS
  Filled 2019-07-13: qty 100

## 2019-07-13 MED ORDER — CALCIUM GLUCONATE-NACL 1-0.675 GM/50ML-% IV SOLN
1.0000 g | Freq: Once | INTRAVENOUS | Status: AC
  Administered 2019-07-13: 1000 mg via INTRAVENOUS
  Filled 2019-07-13: qty 50

## 2019-07-13 MED ORDER — IOHEXOL 300 MG/ML  SOLN
100.0000 mL | Freq: Once | INTRAMUSCULAR | Status: AC | PRN
  Administered 2019-07-13: 100 mL via INTRAVENOUS

## 2019-07-13 MED ORDER — FENTANYL BOLUS VIA INFUSION
50.0000 ug | INTRAVENOUS | Status: DC | PRN
  Filled 2019-07-13: qty 50

## 2019-07-13 MED ORDER — INSULIN ASPART 100 UNIT/ML IV SOLN
10.0000 [IU] | Freq: Once | INTRAVENOUS | Status: AC
  Administered 2019-07-13: 10 [IU] via INTRAVENOUS

## 2019-07-13 MED ORDER — ORAL CARE MOUTH RINSE
15.0000 mL | OROMUCOSAL | Status: DC
  Administered 2019-07-13 – 2019-07-21 (×77): 15 mL via OROMUCOSAL

## 2019-07-13 MED ORDER — SODIUM CHLORIDE 0.9% FLUSH: 10.0000 mL | INTRAVENOUS | Status: DC | PRN

## 2019-07-13 MED ORDER — CHLORHEXIDINE GLUCONATE CLOTH 2 % EX PADS
6.0000 | MEDICATED_PAD | Freq: Every day | CUTANEOUS | Status: DC
  Administered 2019-07-14 – 2019-07-21 (×7): 6 via TOPICAL

## 2019-07-13 MED ORDER — PANTOPRAZOLE SODIUM 40 MG PO TBEC: 40.0000 mg | DELAYED_RELEASE_TABLET | Freq: Every day | ORAL | Status: DC

## 2019-07-13 MED ORDER — PANTOPRAZOLE SODIUM 40 MG IV SOLR
40.0000 mg | Freq: Every day | INTRAVENOUS | Status: DC
  Administered 2019-07-13 – 2019-07-16 (×4): 40 mg via INTRAVENOUS
  Filled 2019-07-13 (×4): qty 40

## 2019-07-13 MED ORDER — FENTANYL 2500MCG IN NS 250ML (10MCG/ML) PREMIX INFUSION
25.0000 ug/h | INTRAVENOUS | Status: DC
  Administered 2019-07-13: 100 ug/h via INTRAVENOUS
  Filled 2019-07-13: qty 250

## 2019-07-13 MED ORDER — SILVER SULFADIAZINE 1 % EX CREA
1.0000 "application " | TOPICAL_CREAM | Freq: Every day | CUTANEOUS | Status: DC
  Filled 2019-07-13: qty 85

## 2019-07-13 MED ORDER — FENTANYL 2500MCG IN NS 250ML (10MCG/ML) PREMIX INFUSION
50.0000 ug/h | INTRAVENOUS | Status: DC
  Administered 2019-07-14 – 2019-07-21 (×3): 50 ug/h via INTRAVENOUS
  Filled 2019-07-13 (×3): qty 250

## 2019-07-13 MED ORDER — SILVER SULFADIAZINE 1 % EX CREA
1.0000 "application " | TOPICAL_CREAM | Freq: Every day | CUTANEOUS | Status: DC
  Administered 2019-07-13 – 2019-07-21 (×10): 1 via TOPICAL
  Filled 2019-07-13 (×3): qty 85

## 2019-07-13 MED ORDER — DEXTROSE 50 % IV SOLN
1.0000 | Freq: Once | INTRAVENOUS | Status: AC
  Administered 2019-07-13: 50 mL via INTRAVENOUS
  Filled 2019-07-13: qty 50

## 2019-07-13 MED ORDER — INSULIN ASPART 100 UNIT/ML ~~LOC~~ SOLN
0.0000 [IU] | SUBCUTANEOUS | Status: DC
  Administered 2019-07-13: 15 [IU] via SUBCUTANEOUS
  Administered 2019-07-13: 11 [IU] via SUBCUTANEOUS
  Administered 2019-07-14: 3 [IU] via SUBCUTANEOUS
  Administered 2019-07-14: 7 [IU] via SUBCUTANEOUS
  Administered 2019-07-14: 4 [IU] via SUBCUTANEOUS
  Administered 2019-07-14: 7 [IU] via SUBCUTANEOUS
  Administered 2019-07-15: 4 [IU] via SUBCUTANEOUS
  Administered 2019-07-15 (×4): 7 [IU] via SUBCUTANEOUS
  Administered 2019-07-15: 11 [IU] via SUBCUTANEOUS
  Administered 2019-07-15: 4 [IU] via SUBCUTANEOUS
  Administered 2019-07-16 (×2): 7 [IU] via SUBCUTANEOUS
  Administered 2019-07-16: 4 [IU] via SUBCUTANEOUS
  Administered 2019-07-16: 7 [IU] via SUBCUTANEOUS
  Administered 2019-07-16: 4 [IU] via SUBCUTANEOUS
  Administered 2019-07-16: 7 [IU] via SUBCUTANEOUS
  Administered 2019-07-17 (×3): 4 [IU] via SUBCUTANEOUS
  Administered 2019-07-17: 3 [IU] via SUBCUTANEOUS
  Administered 2019-07-17: 4 [IU] via SUBCUTANEOUS
  Administered 2019-07-17: 3 [IU] via SUBCUTANEOUS
  Administered 2019-07-18 (×3): 4 [IU] via SUBCUTANEOUS
  Administered 2019-07-18: 3 [IU] via SUBCUTANEOUS
  Administered 2019-07-18 – 2019-07-19 (×7): 4 [IU] via SUBCUTANEOUS
  Administered 2019-07-20: 7 [IU] via SUBCUTANEOUS
  Administered 2019-07-20: 4 [IU] via SUBCUTANEOUS
  Administered 2019-07-20: 7 [IU] via SUBCUTANEOUS
  Administered 2019-07-20 – 2019-07-21 (×7): 4 [IU] via SUBCUTANEOUS

## 2019-07-13 MED ORDER — SODIUM CHLORIDE 0.9 % IV SOLN: INTRAVENOUS | Status: DC

## 2019-07-13 MED ORDER — SODIUM POLYSTYRENE SULFONATE 15 GM/60ML PO SUSP
15.0000 g | Freq: Once | ORAL | Status: AC
  Administered 2019-07-13: 15 g
  Filled 2019-07-13: qty 60

## 2019-07-13 MED ORDER — POLYETHYLENE GLYCOL 3350 17 G PO PACK: 17.0000 g | PACK | Freq: Every day | ORAL | Status: DC

## 2019-07-13 MED ORDER — SODIUM CHLORIDE 0.9 % IV SOLN
INTRAVENOUS | Status: AC | PRN
  Administered 2019-07-13: 1000 mL via INTRAVENOUS

## 2019-07-13 MED FILL — Medication: Qty: 1 | Status: AC

## 2019-07-13 NOTE — Progress Notes (Signed)
Pt transported to CT and back without any complications.

## 2019-07-13 NOTE — ED Notes (Signed)
Pt successfully intubated- 8.0 tube.

## 2019-07-13 NOTE — Progress Notes (Signed)
Patient ID: John Bailey, male   DOB: Jun 24, 1970, 49 y.o.   MRN: [id number] I updated his wife at the bedside.  Georganna Skeans, MD, MPH, FACS Please use [email or web address] to contact on call provider

## 2019-07-13 NOTE — Code Documentation (Signed)
Return of pulses- preparing to change king airway to ET tube.
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
## 2019-07-13 NOTE — Progress Notes (Signed)
Called [facility]; referral number [phone] - [facility] stated he is a registered donor

## 2019-07-13 NOTE — Progress Notes (Signed)
Critical ABG results given to Dr. Grandville Silos.  Vent changes made to VT 550, RR 24, FIO2 50%.

## 2019-07-13 NOTE — Progress Notes (Signed)
Pt transported to ICU room 4N20 with full O2 tanks. No complications noted.
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
## 2019-07-13 NOTE — Discharge Planning (Signed)
EDCM to follow for disposition needs.  
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
## 2019-07-13 NOTE — ED Notes (Signed)
MD thompson & ray working on [employer] and bilateral chest tubes.
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
## 2019-07-14 ENCOUNTER — Inpatient Hospital Stay (HOSPITAL_COMMUNITY): Payer: BC Managed Care – PPO

## 2019-07-14 DIAGNOSIS — Z7189 Other specified counseling: Secondary | ICD-10-CM

## 2019-07-14 DIAGNOSIS — I469 Cardiac arrest, cause unspecified: Secondary | ICD-10-CM

## 2019-07-14 DIAGNOSIS — I5030 Unspecified diastolic (congestive) heart failure: Secondary | ICD-10-CM

## 2019-07-14 DIAGNOSIS — N186 End stage renal disease: Secondary | ICD-10-CM

## 2019-07-14 DIAGNOSIS — Z515 Encounter for palliative care: Secondary | ICD-10-CM

## 2019-07-14 HISTORY — PX: IR US GUIDE VASC ACCESS LEFT: IMG2389

## 2019-07-14 HISTORY — PX: IR FLUORO GUIDE CV LINE LEFT: IMG2282

## 2019-07-14 LAB — POCT I-STAT 7, (LYTES, BLD GAS, ICA,H+H)
Acid-base deficit: 1 mmol/L (ref 0.0–2.0)
Acid-base deficit: 1 mmol/L (ref 0.0–2.0)
Bicarbonate: 24 mmol/L (ref 20.0–28.0)
Bicarbonate: 24.3 mmol/L (ref 20.0–28.0)
Calcium, Ion: 0.95 mmol/L — ABNORMAL LOW (ref 1.15–1.40)
Calcium, Ion: 0.97 mmol/L — ABNORMAL LOW (ref 1.15–1.40)
HCT: 31 % — ABNORMAL LOW (ref 39.0–52.0)
HCT: 31 % — ABNORMAL LOW (ref 39.0–52.0)
Hemoglobin: 10.5 g/dL — ABNORMAL LOW (ref 13.0–17.0)
Hemoglobin: 10.5 g/dL — ABNORMAL LOW (ref 13.0–17.0)
O2 Saturation: 96 %
O2 Saturation: 97 %
Patient temperature: 98.6
Patient temperature: 98.6
Potassium: 7 mmol/L (ref 3.5–5.1)
Potassium: 7 mmol/L (ref 3.5–5.1)
Sodium: 136 mmol/L (ref 135–145)
Sodium: 135 mmol/L (ref 135–145)
TCO2: 25 mmol/L (ref 22–32)
TCO2: 26 mmol/L (ref 22–32)
pCO2 arterial: 38 mmHg (ref 32.0–48.0)
pCO2 arterial: 43 mmHg (ref 32.0–48.0)
pH, Arterial: 7.408 (ref 7.350–7.450)
pH, Arterial: 7.36 (ref 7.350–7.450)
pO2, Arterial: 81 mmHg — ABNORMAL LOW (ref 83.0–108.0)
pO2, Arterial: 95 mmHg (ref 83.0–108.0)

## 2019-07-14 LAB — RENAL FUNCTION PANEL
Albumin: 3.1 g/dL — ABNORMAL LOW (ref 3.5–5.0)
Anion gap: 16 — ABNORMAL HIGH (ref 5–15)
BUN: 70 mg/dL — ABNORMAL HIGH (ref 6–20)
CO2: 23 mmol/L (ref 22–32)
Calcium: 8.2 mg/dL — ABNORMAL LOW (ref 8.9–10.3)
Chloride: 98 mmol/L (ref 98–111)
Creatinine, Ser: 13.21 mg/dL — ABNORMAL HIGH (ref 0.61–1.24)
GFR calc Af Amer: 5 mL/min — ABNORMAL LOW (ref >60)
GFR calc non Af Amer: 4 mL/min — ABNORMAL LOW (ref >60)
Glucose, Bld: 179 mg/dL — ABNORMAL HIGH (ref 70–99)
Phosphorus: 6.6 mg/dL — ABNORMAL HIGH (ref 2.5–4.6)
Potassium: 5.7 mmol/L — ABNORMAL HIGH (ref 3.5–5.1)
Sodium: 137 mmol/L (ref 135–145)

## 2019-07-14 LAB — BLOOD PRODUCT ORDER (VERBAL) VERIFICATION

## 2019-07-14 LAB — BASIC METABOLIC PANEL
Anion gap: 18 — ABNORMAL HIGH (ref 5–15)
BUN: 71 mg/dL — ABNORMAL HIGH (ref 6–20)
CO2: 23 mmol/L (ref 22–32)
Calcium: 7.9 mg/dL — ABNORMAL LOW (ref 8.9–10.3)
Chloride: 96 mmol/L — ABNORMAL LOW (ref 98–111)
Creatinine, Ser: 13.72 mg/dL — ABNORMAL HIGH (ref 0.61–1.24)
GFR calc Af Amer: 4 mL/min — ABNORMAL LOW (ref >60)
GFR calc non Af Amer: 4 mL/min — ABNORMAL LOW (ref >60)
Glucose, Bld: 140 mg/dL — ABNORMAL HIGH (ref 70–99)
Potassium: 6.6 mmol/L (ref 3.5–5.1)
Sodium: 137 mmol/L (ref 135–145)

## 2019-07-14 LAB — GLUCOSE, CAPILLARY
Glucose-Capillary: 125 mg/dL — ABNORMAL HIGH (ref 70–99)
Glucose-Capillary: 116 mg/dL — ABNORMAL HIGH (ref 70–99)
Glucose-Capillary: 130 mg/dL — ABNORMAL HIGH (ref 70–99)
Glucose-Capillary: 206 mg/dL — ABNORMAL HIGH (ref 70–99)
Glucose-Capillary: 180 mg/dL — ABNORMAL HIGH (ref 70–99)
Glucose-Capillary: 180 mg/dL — ABNORMAL HIGH (ref 70–99)

## 2019-07-14 LAB — PREPARE FRESH FROZEN PLASMA
Unit division: 0
Unit division: 0

## 2019-07-14 LAB — CBC
HCT: 28.5 % — ABNORMAL LOW (ref 39.0–52.0)
Hemoglobin: 9.5 g/dL — ABNORMAL LOW (ref 13.0–17.0)
MCH: 33.7 pg (ref 26.0–34.0)
MCHC: 33.3 g/dL (ref 30.0–36.0)
MCV: 101.1 fL — ABNORMAL HIGH (ref 80.0–100.0)
Platelets: 255 10*3/uL (ref 150–400)
RBC: 2.82 MIL/uL — ABNORMAL LOW (ref 4.22–5.81)
RDW: 14.2 % (ref 11.5–15.5)
WBC: 18.4 10*3/uL — ABNORMAL HIGH (ref 4.0–10.5)
nRBC: 0 % (ref 0.0–0.2)

## 2019-07-14 LAB — BPAM FFP
Blood Product Expiration Date: 202105032359
Blood Product Expiration Date: 202105072359
ISSUE DATE / TIME: 202104291103
ISSUE DATE / TIME: 202104291108
Unit Type and Rh: 6200
Unit Type and Rh: 6200

## 2019-07-14 LAB — TRAUMA TEG PANEL
CFF Max Amplitude: 37.8 mm — ABNORMAL HIGH (ref 15–32)
Citrated Kaolin (R): 10.4 min — ABNORMAL HIGH (ref 4.6–9.1)
Citrated Rapid TEG (MA): 69.7 mm (ref 52–70)
Lysis at 30 Minutes: 0 % (ref 0.0–2.6)

## 2019-07-14 LAB — TRIGLYCERIDES: Triglycerides: 163 mg/dL — ABNORMAL HIGH (ref <150)

## 2019-07-14 MED ORDER — AMIODARONE HCL 200 MG PO TABS
200.0000 mg | ORAL_TABLET | Freq: Every morning | ORAL | Status: DC
  Administered 2019-07-14 – 2019-07-21 (×8): 200 mg
  Filled 2019-07-14 (×8): qty 1

## 2019-07-14 MED ORDER — PRO-STAT SUGAR FREE PO LIQD: 30.0000 mL | Freq: Two times a day (BID) | ORAL | Status: DC

## 2019-07-14 MED ORDER — HEPARIN SODIUM (PORCINE) 5000 UNIT/ML IJ SOLN
5000.0000 [IU] | Freq: Three times a day (TID) | INTRAMUSCULAR | Status: DC
  Administered 2019-07-14 – 2019-07-21 (×21): 5000 [IU] via SUBCUTANEOUS
  Filled 2019-07-14 (×21): qty 1

## 2019-07-14 MED ORDER — CHLORHEXIDINE GLUCONATE 4 % EX LIQD
CUTANEOUS | Status: AC
  Filled 2019-07-14: qty 15

## 2019-07-14 MED ORDER — PRO-STAT SUGAR FREE PO LIQD
60.0000 mL | Freq: Three times a day (TID) | ORAL | Status: DC
  Administered 2019-07-14 – 2019-07-20 (×13): 60 mL
  Filled 2019-07-14 (×13): qty 60

## 2019-07-14 MED ORDER — HEPARIN SODIUM (PORCINE) 1000 UNIT/ML DIALYSIS
1000.0000 [IU] | INTRAMUSCULAR | Status: DC | PRN
  Administered 2019-07-16 – 2019-07-18 (×2): 2800 [IU] via INTRAVENOUS_CENTRAL
  Filled 2019-07-14 (×4): qty 6

## 2019-07-14 MED ORDER — SODIUM CHLORIDE 0.9 % FOR CRRT: INTRAVENOUS_CENTRAL | Status: DC | PRN

## 2019-07-14 MED ORDER — INSULIN ASPART 100 UNIT/ML IV SOLN
10.0000 [IU] | Freq: Once | INTRAVENOUS | Status: AC
  Administered 2019-07-14: 10 [IU] via INTRAVENOUS

## 2019-07-14 MED ORDER — PIVOT 1.5 CAL PO LIQD
1000.0000 mL | ORAL | Status: DC
  Administered 2019-07-14 – 2019-07-16 (×2): 1000 mL

## 2019-07-14 MED ORDER — CALCIUM GLUCONATE-NACL 2-0.675 GM/100ML-% IV SOLN
2.0000 g | Freq: Once | INTRAVENOUS | Status: AC
  Administered 2019-07-14: 2000 mg via INTRAVENOUS
  Filled 2019-07-14: qty 100

## 2019-07-14 MED ORDER — LIDOCAINE HCL (PF) 1 % IJ SOLN
INTRAMUSCULAR | Status: DC | PRN
  Administered 2019-07-14: 5 mL

## 2019-07-14 MED ORDER — FENTANYL CITRATE (PF) 100 MCG/2ML IJ SOLN: 12.5000 ug | INTRAMUSCULAR | Status: DC | PRN

## 2019-07-14 MED ORDER — CINACALCET HCL 30 MG PO TABS: 120.0000 mg | ORAL_TABLET | Freq: Every day | ORAL | Status: DC

## 2019-07-14 MED ORDER — SODIUM POLYSTYRENE SULFONATE 15 GM/60ML PO SUSP
30.0000 g | Freq: Once | ORAL | Status: AC
  Administered 2019-07-14: 30 g
  Filled 2019-07-14: qty 120

## 2019-07-14 MED ORDER — PRISMASOL BGK 0/2.5 32-2.5 MEQ/L IV SOLN
INTRAVENOUS | Status: DC
  Filled 2019-07-14 (×12): qty 5000

## 2019-07-14 MED ORDER — METOPROLOL TARTRATE 5 MG/5ML IV SOLN
5.0000 mg | Freq: Four times a day (QID) | INTRAVENOUS | Status: DC | PRN
  Administered 2019-07-14 – 2019-07-17 (×2): 5 mg via INTRAVENOUS
  Filled 2019-07-14 (×3): qty 5

## 2019-07-14 MED ORDER — HEPARIN SODIUM (PORCINE) 1000 UNIT/ML IJ SOLN
INTRAMUSCULAR | Status: AC
  Filled 2019-07-14: qty 1

## 2019-07-14 MED ORDER — LIDOCAINE HCL 1 % IJ SOLN
INTRAMUSCULAR | Status: AC
  Filled 2019-07-14: qty 20

## 2019-07-14 MED ORDER — CARVEDILOL 3.125 MG PO TABS
6.2500 mg | ORAL_TABLET | Freq: Two times a day (BID) | ORAL | Status: DC
  Administered 2019-07-14 – 2019-07-16 (×5): 6.25 mg
  Filled 2019-07-14 (×5): qty 2

## 2019-07-14 MED ORDER — PRISMASOL BGK 0/2.5 32-2.5 MEQ/L IV SOLN
INTRAVENOUS | Status: DC
  Filled 2019-07-14 (×35): qty 5000

## 2019-07-14 MED ORDER — DEXTROSE 50 % IV SOLN
25.0000 g | Freq: Once | INTRAVENOUS | Status: AC
  Administered 2019-07-14: 25 g via INTRAVENOUS
  Filled 2019-07-14: qty 50

## 2019-07-14 MED ORDER — VITAL HIGH PROTEIN PO LIQD: 1000.0000 mL | ORAL | Status: DC

## 2019-07-14 MED ORDER — JUVEN PO PACK
1.0000 | PACK | Freq: Two times a day (BID) | ORAL | Status: DC
  Administered 2019-07-15 – 2019-07-20 (×8): 1
  Filled 2019-07-14 (×14): qty 1
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
## 2019-07-14 NOTE — Social Work (Signed)
CSW was unable to complete sbirt due to pt being on the ventilator. CSW may attempt to complete at more appropriate time.   Emeterio Reeve, Latanya Presser, [place] Social Worker [phone]
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
## 2019-07-14 NOTE — Procedures (Signed)
Interventional Radiology Procedure Note  Procedure: Left IJ temp cath placement  Complications: None  Estimated Blood Loss: < 10 mL  Findings: 13 Fr, 20 cm triple lumen Mah[place]kar temp cath placed via left IJ vein with tip in lower SVC. OK to use.  Venetia Night. Kathlene Cote, M.D Pager:  [phone]

## 2019-07-14 NOTE — Progress Notes (Signed)
CRITICAL VALUE ALERT  Critical Value:  Potassium 6.6  Date & Time Notied:  07/14/19    0843  Provider Notified: Bobbye Morton  Orders Received/Actions taken: new orders received, will continue to monitor  Hiram Gash RN

## 2019-07-14 NOTE — Progress Notes (Signed)
Patient transported to IR and back without complications. RN at bedside. ?
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
## 2019-07-14 NOTE — Progress Notes (Signed)
Visited with patient per family request.  Wife and sister-in-law at bedside.  Patient is being tested today to see if brain is still functioning. Wife is a little upset due to the process  and protocol  order. I spoke with patient nurse for clarity on what is taken place today.so to better help family understand.  Will continue to support family as needed. Provide ministry of presence ,emotional and spiritual support to wife and sister -in-law.   Supported Biochemist, clinical as needed. Chaplain available as needed.  Jaclynn Major, [place], [facility], Pager [phone]
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
## 2019-07-14 NOTE — Progress Notes (Signed)
Apnea test performed with Dr. Bobbye Morton, RN, and RT at bedside. Cuff was deflated and patient was taken off the vent. Oxygen tubing placed down ETT tube for 8 minutes at 8L.   ABG Before:  PH: 7.40 CO2: 38 PaO2: 81  Bicarb: 24  ABG After: PH: 7.36 CO2: 43 PaO2: 95 Bicarb: 24  Patient placed back on the vent and cuff inflated.

## 2019-07-15 ENCOUNTER — Inpatient Hospital Stay (HOSPITAL_COMMUNITY): Payer: BC Managed Care – PPO

## 2019-07-15 DIAGNOSIS — J939 Pneumothorax, unspecified: Secondary | ICD-10-CM

## 2019-07-15 LAB — GLUCOSE, CAPILLARY
Glucose-Capillary: 237 mg/dL — ABNORMAL HIGH (ref 70–99)
Glucose-Capillary: 238 mg/dL — ABNORMAL HIGH (ref 70–99)
Glucose-Capillary: 218 mg/dL — ABNORMAL HIGH (ref 70–99)
Glucose-Capillary: 209 mg/dL — ABNORMAL HIGH (ref 70–99)
Glucose-Capillary: 191 mg/dL — ABNORMAL HIGH (ref 70–99)
Glucose-Capillary: 228 mg/dL — ABNORMAL HIGH (ref 70–99)

## 2019-07-15 LAB — RENAL FUNCTION PANEL
Albumin: 2.8 g/dL — ABNORMAL LOW (ref 3.5–5.0)
Albumin: 2.7 g/dL — ABNORMAL LOW (ref 3.5–5.0)
Anion gap: 13 (ref 5–15)
Anion gap: 14 (ref 5–15)
BUN: 60 mg/dL — ABNORMAL HIGH (ref 6–20)
BUN: 64 mg/dL — ABNORMAL HIGH (ref 6–20)
CO2: 26 mmol/L (ref 22–32)
CO2: 24 mmol/L (ref 22–32)
Calcium: 8.6 mg/dL — ABNORMAL LOW (ref 8.9–10.3)
Calcium: 8.5 mg/dL — ABNORMAL LOW (ref 8.9–10.3)
Chloride: 98 mmol/L (ref 98–111)
Chloride: 98 mmol/L (ref 98–111)
Creatinine, Ser: 9.31 mg/dL — ABNORMAL HIGH (ref 0.61–1.24)
Creatinine, Ser: 7.42 mg/dL — ABNORMAL HIGH (ref 0.61–1.24)
GFR calc Af Amer: 7 mL/min — ABNORMAL LOW
GFR calc Af Amer: 9 mL/min — ABNORMAL LOW
GFR calc non Af Amer: 6 mL/min — ABNORMAL LOW
GFR calc non Af Amer: 8 mL/min — ABNORMAL LOW
Glucose, Bld: 275 mg/dL — ABNORMAL HIGH (ref 70–99)
Glucose, Bld: 241 mg/dL — ABNORMAL HIGH (ref 70–99)
Phosphorus: 6.1 mg/dL — ABNORMAL HIGH (ref 2.5–4.6)
Phosphorus: 5.1 mg/dL — ABNORMAL HIGH (ref 2.5–4.6)
Potassium: 4.9 mmol/L (ref 3.5–5.1)
Potassium: 4.5 mmol/L (ref 3.5–5.1)
Sodium: 137 mmol/L (ref 135–145)
Sodium: 136 mmol/L (ref 135–145)

## 2019-07-15 LAB — TRIGLYCERIDES: Triglycerides: 192 mg/dL — ABNORMAL HIGH (ref <150)

## 2019-07-15 LAB — MAGNESIUM: Magnesium: 2.3 mg/dL (ref 1.7–2.4)

## 2019-07-15 MED ORDER — SODIUM CHLORIDE 0.9 % IV SOLN
250.0000 [IU]/h | INTRAVENOUS | Status: DC
  Administered 2019-07-15 – 2019-07-16 (×2): 700 [IU]/h via INTRAVENOUS_CENTRAL
  Administered 2019-07-17: 1200 [IU]/h via INTRAVENOUS_CENTRAL
  Administered 2019-07-17: 1000 [IU]/h via INTRAVENOUS_CENTRAL
  Administered 2019-07-17 – 2019-07-18 (×3): 1300 [IU]/h via INTRAVENOUS_CENTRAL
  Filled 2019-07-15 (×4): qty 2
  Filled 2019-07-15: qty 10000
  Filled 2019-07-15 (×2): qty 2
  Filled 2019-07-15: qty 10000

## 2019-07-15 MED ORDER — METOCLOPRAMIDE HCL 5 MG/ML IJ SOLN
5.0000 mg | Freq: Three times a day (TID) | INTRAMUSCULAR | Status: DC
  Administered 2019-07-15 – 2019-07-18 (×10): 5 mg via INTRAVENOUS
  Filled 2019-07-15 (×10): qty 2

## 2019-07-15 MED ORDER — BISACODYL 10 MG RE SUPP
10.0000 mg | Freq: Once | RECTAL | Status: AC
  Administered 2019-07-15: 10 mg via RECTAL
  Filled 2019-07-15: qty 1

## 2019-07-15 MED ORDER — HEPARIN BOLUS VIA INFUSION (CRRT)
1000.0000 [IU] | INTRAVENOUS | Status: DC | PRN
  Filled 2019-07-15 (×3): qty 1000

## 2019-07-15 MED ORDER — INSULIN GLARGINE 100 UNIT/ML ~~LOC~~ SOLN
10.0000 [IU] | Freq: Every day | SUBCUTANEOUS | Status: DC
  Administered 2019-07-15: 10 [IU] via SUBCUTANEOUS
  Filled 2019-07-15 (×2): qty 0.1

## 2019-07-15 MED ORDER — HEPARIN (PORCINE) 2000 UNITS/L FOR CRRT
INTRAVENOUS_CENTRAL | Status: DC | PRN
  Filled 2019-07-15: qty 1000

## 2019-07-15 MED ORDER — ALBUMIN HUMAN 5 % IV SOLN
25.0000 g | Freq: Once | INTRAVENOUS | Status: DC
  Filled 2019-07-15: qty 500
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
## 2019-07-15 NOTE — Progress Notes (Signed)
Patient ID: John Bailey, male   DOB: [birth year], 49 y.o.   MRN: [id number] [facility] Progress Note   Assessment/ Plan:   1.  Multiple bilateral anterior rib fractures with bilateral pneumothorax, VDRF: Status post bilateral chest tubes and ongoing ventilator support.  Negative apnea test yesterday morning. 2.  PEA cardiac arrest following chest trauma: Prolonged downtime of 35 minutes before ROSC with suspected anoxic/hypoxic brain injury. 3. ESRD/hyperkalemia: Usually MWF and started on CRRT yesterday via left IJ temporary dialysis catheter.  We will begin heparin for anticoagulation 4. Anemia: Hemoglobin and hematocrit relatively stable, without overt blood loss.  Continue to monitor on heparin circuit anticoagulation. 5. CKD-MBD: Currently n.p.o., will continue to follow calcium and phosphorus levels while on CRRT. 6. Nutrition: Currently n.p.o., enteral nutrition per surgical protocol. 7. Hypertension: Blood pressure significantly elevated, begin CRRT and begin ultrafiltration.  Subjective:   Without acute events overnight, tolerating CRRT until filter clotted this morning.  Discussed events with wife.   Objective:   BP 119/87 (BP Location: Right Arm)   Pulse 92   Temp (!) 93.3 F (34.1 C) (Rectal) Comment: Nurse notified  Resp (!) 24   Ht 6' (1.829 m)   Wt (!) 143.9 kg   SpO2 100%   BMI 43.03 kg/m   Physical Exam: Gen: Intubated, unresponsive.  Wife at his bedside CVS: Pulse regular tachycardia, S1 and S2 normal Resp: Anteriorly distant breath sounds bilaterally, bilateral chest tubes in situ.  Left temporary IJ catheter Abd: Soft, obese, nontender Ext: Trace lower extremity edema with some bruising.  Thrombosed left upper extremity fistula.  Labs: BMET Recent Labs  Lab 06/24/2019 1037 07/08/2019 1037 06/15/2019 1048 06/24/2019 1225 06/17/2019 1521 07/10/2019 1533 07/14/19 0500 07/14/19 1123 07/14/19 1424 07/14/19 1721 07/15/19 0513  NA 134*   < > 136   <  > 134* 135 137 136 135 137 137  K 6.5*   < > 6.5*   < > 4.9 5.0 6.6* 7.0* 7.0* 5.7* 4.9  CL 98  --  93*  --   --  95* 96*  --   --  98 98  CO2  --   --  17*  --   --  23 23  --   --  23 26  GLUCOSE 257*  --  344*  --   --  362* 140*  --   --  179* 275*  BUN 73*  --  55*  --   --  60* 71*  --   --  70* 60*  CREATININE 13.00*  --  13.73*  --   --  13.47* 13.72*  --   --  13.21* 9.31*  CALCIUM  --   --  8.6*  --   --  8.0* 7.9*  --   --  8.2* 8.6*  PHOS  --   --   --   --   --   --   --   --   --  6.6* 6.1*   < > = values in this interval not displayed.   CBC Recent Labs  Lab 06/24/2019 1033 07/05/2019 1037 07/01/2019 1521 07/14/19 0500 07/14/19 1123 07/14/19 1424  WBC 18.7*  --   --  18.4*  --   --   HGB 11.7*   < > 10.9* 9.5* 10.5* 10.5*  HCT 38.8*   < > 32.0* 28.5* 31.0* 31.0*  MCV 111.8*  --   --  101.1*  --   --  PLT 291  --   --  255  --   --    < > = values in this interval not displayed.     Medications:    . amiodarone  200 mg Per Tube q morning - 10a  . bisacodyl  10 mg Rectal Once  . carvedilol  6.25 mg Per Tube BID  . chlorhexidine gluconate (MEDLINE KIT)  15 mL Mouth Rinse BID  . Chlorhexidine Gluconate Cloth  6 each Topical Daily  . docusate  100 mg Per Tube BID  . feeding supplement (PRO-STAT SUGAR FREE 64)  60 mL Per Tube TID  . fentaNYL (SUBLIMAZE) injection  50 mcg Intravenous Once  . heparin injection (subcutaneous)  5,000 Units Subcutaneous Q8H  . insulin aspart  0-20 Units Subcutaneous Q4H  . insulin glargine  10 Units Subcutaneous Daily  . mouth rinse  15 mL Mouth Rinse 10 times per day  . metoCLOPramide (REGLAN) injection  5 mg Intravenous Q8H  . nutrition supplement (JUVEN)  1 packet Per Tube BID BM  . pantoprazole  40 mg Oral Daily   Or  . pantoprazole (PROTONIX) IV  40 mg Intravenous Daily  . polyethylene glycol  17 g Per Tube Daily  . silver sulfADIAZINE  1 application Topical Daily  . sodium chloride flush  10-40 mL Intracatheter Q12H   Elmarie Shiley, MD 07/15/2019, 9:43 AM

## 2019-07-15 NOTE — Progress Notes (Signed)
Paged MD regarding low Blood pressure, orders received.

## 2019-07-15 DEATH — deceased

## 2019-07-16 ENCOUNTER — Inpatient Hospital Stay (HOSPITAL_COMMUNITY): Payer: BC Managed Care – PPO

## 2019-07-16 ENCOUNTER — Other Ambulatory Visit (HOSPITAL_COMMUNITY): Payer: BC Managed Care – PPO

## 2019-07-16 DIAGNOSIS — J9690 Respiratory failure, unspecified, unspecified whether with hypoxia or hypercapnia: Secondary | ICD-10-CM

## 2019-07-16 DIAGNOSIS — J9600 Acute respiratory failure, unspecified whether with hypoxia or hypercapnia: Secondary | ICD-10-CM

## 2019-07-16 LAB — RENAL FUNCTION PANEL
Albumin: 2.6 g/dL — ABNORMAL LOW (ref 3.5–5.0)
Albumin: 2.4 g/dL — ABNORMAL LOW (ref 3.5–5.0)
Anion gap: 12 (ref 5–15)
Anion gap: 13 (ref 5–15)
BUN: 61 mg/dL — ABNORMAL HIGH (ref 6–20)
BUN: 54 mg/dL — ABNORMAL HIGH (ref 6–20)
CO2: 26 mmol/L (ref 22–32)
CO2: 24 mmol/L (ref 22–32)
Calcium: 8.6 mg/dL — ABNORMAL LOW (ref 8.9–10.3)
Calcium: 8.8 mg/dL — ABNORMAL LOW (ref 8.9–10.3)
Chloride: 97 mmol/L — ABNORMAL LOW (ref 98–111)
Chloride: 99 mmol/L (ref 98–111)
Creatinine, Ser: 5.85 mg/dL — ABNORMAL HIGH (ref 0.61–1.24)
Creatinine, Ser: 4.78 mg/dL — ABNORMAL HIGH (ref 0.61–1.24)
GFR calc Af Amer: 12 mL/min — ABNORMAL LOW (ref >60)
GFR calc Af Amer: 16 mL/min — ABNORMAL LOW (ref >60)
GFR calc non Af Amer: 11 mL/min — ABNORMAL LOW (ref >60)
GFR calc non Af Amer: 14 mL/min — ABNORMAL LOW (ref >60)
Glucose, Bld: 229 mg/dL — ABNORMAL HIGH (ref 70–99)
Glucose, Bld: 213 mg/dL — ABNORMAL HIGH (ref 70–99)
Phosphorus: 3.7 mg/dL (ref 2.5–4.6)
Phosphorus: 3.8 mg/dL (ref 2.5–4.6)
Potassium: 4.4 mmol/L (ref 3.5–5.1)
Potassium: 4.6 mmol/L (ref 3.5–5.1)
Sodium: 135 mmol/L (ref 135–145)
Sodium: 136 mmol/L (ref 135–145)

## 2019-07-16 LAB — CBC
HCT: 27.2 % — ABNORMAL LOW (ref 39.0–52.0)
Hemoglobin: 8.9 g/dL — ABNORMAL LOW (ref 13.0–17.0)
MCH: 33.5 pg (ref 26.0–34.0)
MCHC: 32.7 g/dL (ref 30.0–36.0)
MCV: 102.3 fL — ABNORMAL HIGH (ref 80.0–100.0)
Platelets: 219 10*3/uL (ref 150–400)
RBC: 2.66 MIL/uL — ABNORMAL LOW (ref 4.22–5.81)
RDW: 14.7 % (ref 11.5–15.5)
WBC: 15.8 10*3/uL — ABNORMAL HIGH (ref 4.0–10.5)
nRBC: 0.1 % (ref 0.0–0.2)

## 2019-07-16 LAB — GLUCOSE, CAPILLARY
Glucose-Capillary: 222 mg/dL — ABNORMAL HIGH (ref 70–99)
Glucose-Capillary: 209 mg/dL — ABNORMAL HIGH (ref 70–99)
Glucose-Capillary: 200 mg/dL — ABNORMAL HIGH (ref 70–99)
Glucose-Capillary: 172 mg/dL — ABNORMAL HIGH (ref 70–99)
Glucose-Capillary: 191 mg/dL — ABNORMAL HIGH (ref 70–99)
Glucose-Capillary: 205 mg/dL — ABNORMAL HIGH (ref 70–99)
Glucose-Capillary: 221 mg/dL — ABNORMAL HIGH (ref 70–99)

## 2019-07-16 LAB — POCT ACTIVATED CLOTTING TIME: Activated Clotting Time: 175 seconds

## 2019-07-16 LAB — APTT: aPTT: 48 seconds — ABNORMAL HIGH (ref 24–36)

## 2019-07-16 LAB — MAGNESIUM: Magnesium: 2.2 mg/dL (ref 1.7–2.4)

## 2019-07-16 MED ORDER — PHENYLEPHRINE HCL-NACL 10-0.9 MG/250ML-% IV SOLN
0.0000 ug/min | INTRAVENOUS | Status: DC
  Administered 2019-07-16: 20 ug/min via INTRAVENOUS
  Administered 2019-07-16: 90 ug/min via INTRAVENOUS
  Filled 2019-07-16 (×2): qty 250

## 2019-07-16 MED ORDER — PANTOPRAZOLE SODIUM 40 MG PO PACK
40.0000 mg | PACK | Freq: Every day | ORAL | Status: DC
  Administered 2019-07-17 – 2019-07-20 (×4): 40 mg
  Filled 2019-07-16 (×4): qty 20

## 2019-07-16 MED ORDER — INSULIN GLARGINE 100 UNIT/ML ~~LOC~~ SOLN
20.0000 [IU] | Freq: Every day | SUBCUTANEOUS | Status: DC
  Administered 2019-07-16 – 2019-07-21 (×6): 20 [IU] via SUBCUTANEOUS
  Filled 2019-07-16 (×6): qty 0.2

## 2019-07-16 MED ORDER — NOREPINEPHRINE 4 MG/250ML-% IV SOLN
0.0000 ug/min | INTRAVENOUS | Status: DC
  Administered 2019-07-16: 2 ug/min via INTRAVENOUS
  Administered 2019-07-16: 10 ug/min via INTRAVENOUS
  Administered 2019-07-17: 9 ug/min via INTRAVENOUS
  Administered 2019-07-17 (×2): 20 ug/min via INTRAVENOUS
  Administered 2019-07-17: 8 ug/min via INTRAVENOUS
  Filled 2019-07-16 (×6): qty 250

## 2019-07-16 MED ORDER — ALBUMIN HUMAN 5 % IV SOLN
12.5000 g | Freq: Once | INTRAVENOUS | Status: AC
  Administered 2019-07-16: 12.5 g via INTRAVENOUS

## 2019-07-16 MED ORDER — HEPARIN (PORCINE) IN NACL 2-0.9 UNITS/ML: INTRAMUSCULAR | Status: DC | PRN

## 2019-07-16 MED ORDER — INSULIN ASPART 100 UNIT/ML ~~LOC~~ SOLN
4.0000 [IU] | SUBCUTANEOUS | Status: DC
  Administered 2019-07-16 – 2019-07-18 (×7): 4 [IU] via SUBCUTANEOUS

## 2019-07-16 MED ORDER — ACETAMINOPHEN 160 MG/5ML PO SOLN
650.0000 mg | ORAL | Status: DC | PRN
  Administered 2019-07-19 – 2019-07-20 (×8): 650 mg
  Filled 2019-07-16 (×7): qty 20.3

## 2019-07-16 MED ORDER — HYDROCORTISONE NA SUCCINATE PF 100 MG IJ SOLR
50.0000 mg | Freq: Three times a day (TID) | INTRAMUSCULAR | Status: DC
  Administered 2019-07-16: 50 mg via INTRAVENOUS
  Filled 2019-07-16: qty 2

## 2019-07-16 MED ORDER — LACTATED RINGERS IV BOLUS
500.0000 mL | Freq: Once | INTRAVENOUS | Status: AC
  Administered 2019-07-16: 500 mL via INTRAVENOUS

## 2019-07-16 NOTE — Progress Notes (Signed)
Spoke with MD Tsuei regarding low grade fever, orders received for PRN tylenol for temp over 101.5
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
## 2019-07-16 NOTE — Progress Notes (Signed)
Paged Nephrology regarding ACT of 175 and increasing filter pressures, order received to increase dose of heparin through Prismaflex syringe to 1000.

## 2019-07-16 NOTE — Progress Notes (Signed)
Patient ID: John Bailey, male   DOB: 10/21/1970, 49 y.o.   MRN: [id number]  [facility] Progress Note   Assessment/ Plan:   1.  Multiple bilateral anterior rib fractures with bilateral pneumothorax, VDRF: Status post bilateral chest tubes and ongoing ventilator support.  Earlier with negative apnea test. 2.  PEA cardiac arrest following chest trauma: Prolonged downtime of 35 minutes before ROSC with likely consequent anoxic/hypoxic brain injury.  Awaiting MRI from this morning to assist with prognosis/assess injury. 3. ESRD/hyperkalemia: Usually MWF and started on CRRT on Friday via left IJ temporary dialysis catheter.  He is now on pressor status post ultrafiltration with hemodialysis and I will decrease the rate of ultrafiltration down to 50 cc an hour.  If CRRT circuit clots today, do not restart. 4. Anemia: Hemoglobin and hematocrit relatively stable, without overt blood loss.  Continue to monitor on heparin circuit anticoagulation. 5. CKD-MBD: Currently n.p.o., will continue to follow calcium and phosphorus levels while on CRRT. 6. Nutrition: Currently n.p.o., enteral nutrition per surgical protocol. 7. Hypertension: Blood pressures now requiring pressor support following UF on CRRT.  Subjective:   CRRT filter clotted once overnight and restarted back with higher rate of heparin infusion.  Now on pressors for hypotension and awaiting to go down for MRI of his head.  Objective:   BP 101/64   Pulse (!) 122   Temp 98 F (36.7 C) (Axillary)   Resp 19   Ht 6' (1.829 m)   Wt (!) 146.4 kg   SpO2 100%   BMI 43.77 kg/m   Physical Exam: Gen: Intubated, unresponsive.  Wife at his bedside CVS: Pulse regular tachycardia, S1 and S2 normal Resp: Anteriorly distant breath sounds bilaterally, bilateral chest tubes in situ.  Left temporary IJ catheter Abd: Soft, obese, nontender Ext: Trace lower extremity edema with some bruising.  Thrombosed left upper extremity  fistula.  Labs: BMET Recent Labs  Lab 06/17/2019 1048 07/07/2019 1225 06/29/2019 1533 06/26/2019 1533 07/14/19 0500 07/14/19 1123 07/14/19 1424 07/14/19 1721 07/15/19 0513 07/15/19 1719 07/16/19 0500  NA 136   < > 135   < > 137 136 135 137 137 136 135  K 6.5*   < > 5.0   < > 6.6* 7.0* 7.0* 5.7* 4.9 4.5 4.4  CL 93*  --  95*  --  96*  --   --  98 98 98 97*  CO2 17*  --  23  --  23  --   --  _0 GLUCOSE 344*  --  362*  --  140*  --   --  179* 275* 241* 229*  BUN 55*  --  60*  --  71*  --   --  70* 60* 64* 61*  CREATININE 13.73*  --  13.47*  --  13.72*  --   --  13.21* 9.31* 7.42* 5.85*  CALCIUM 8.6*  --  8.0*  --  7.9*  --   --  8.2* 8.6* 8.5* 8.6*  PHOS  --   --   --   --   --   --   --  6.6* 6.1* 5.1* 3.7   < > = values in this interval not displayed.   CBC Recent Labs  Lab 07/10/2019 1033 07/07/2019 1037 07/14/19 0500 07/14/19 1123 07/14/19 1424 07/16/19 0500  WBC 18.7*  --  18.4*  --   --  15.8*  HGB 11.7*   < > 9.5* 10.5* 10.5* 8.9*  HCT  38.8*   < > 28.5* 31.0* 31.0* 27.2*  MCV 111.8*  --  101.1*  --   --  102.3*  PLT 291  --  255  --   --  219   < > = values in this interval not displayed.     Medications:    . amiodarone  200 mg Per Tube q morning - 10a  . carvedilol  6.25 mg Per Tube BID  . chlorhexidine gluconate (MEDLINE KIT)  15 mL Mouth Rinse BID  . Chlorhexidine Gluconate Cloth  6 each Topical Daily  . docusate  100 mg Per Tube BID  . feeding supplement (PRO-STAT SUGAR FREE 64)  60 mL Per Tube TID  . fentaNYL (SUBLIMAZE) injection  50 mcg Intravenous Once  . heparin injection (subcutaneous)  5,000 Units Subcutaneous Q8H  . hydrocortisone sod succinate (SOLU-CORTEF) inj  50 mg Intravenous Q8H  . insulin aspart  0-20 Units Subcutaneous Q4H  . insulin glargine  10 Units Subcutaneous Daily  . mouth rinse  15 mL Mouth Rinse 10 times per day  . metoCLOPramide (REGLAN) injection  5 mg Intravenous Q8H  . nutrition supplement (JUVEN)  1 packet Per Tube BID BM   . pantoprazole  40 mg Oral Daily   Or  . pantoprazole (PROTONIX) IV  40 mg Intravenous Daily  . polyethylene glycol  17 g Per Tube Daily  . silver sulfADIAZINE  1 application Topical Daily  . sodium chloride flush  10-40 mL Intracatheter Q12H   Elmarie Shiley, MD 07/16/2019, 8:30 AM

## 2019-07-16 NOTE — Plan of Care (Signed)
  Problem: Elimination: Goal: Will not experience complications related to urinary retention Outcome: Not Applicable

## 2019-07-17 ENCOUNTER — Inpatient Hospital Stay (HOSPITAL_COMMUNITY): Payer: BC Managed Care – PPO

## 2019-07-17 ENCOUNTER — Encounter (HOSPITAL_COMMUNITY): Payer: Self-pay | Admitting: Certified Registered"

## 2019-07-17 ENCOUNTER — Encounter (HOSPITAL_COMMUNITY): Payer: Self-pay | Admitting: General Surgery

## 2019-07-17 DIAGNOSIS — G931 Anoxic brain damage, not elsewhere classified: Secondary | ICD-10-CM

## 2019-07-17 LAB — RENAL FUNCTION PANEL
Albumin: 2.2 g/dL — ABNORMAL LOW (ref 3.5–5.0)
Albumin: 2 g/dL — ABNORMAL LOW (ref 3.5–5.0)
Anion gap: 12 (ref 5–15)
Anion gap: 14 (ref 5–15)
BUN: 60 mg/dL — ABNORMAL HIGH (ref 6–20)
BUN: 51 mg/dL — ABNORMAL HIGH (ref 6–20)
CO2: 26 mmol/L (ref 22–32)
CO2: 23 mmol/L (ref 22–32)
Calcium: 8.9 mg/dL (ref 8.9–10.3)
Calcium: 8.8 mg/dL — ABNORMAL LOW (ref 8.9–10.3)
Chloride: 99 mmol/L (ref 98–111)
Chloride: 100 mmol/L (ref 98–111)
Creatinine, Ser: 4.94 mg/dL — ABNORMAL HIGH (ref 0.61–1.24)
Creatinine, Ser: 4.13 mg/dL — ABNORMAL HIGH (ref 0.61–1.24)
GFR calc Af Amer: 15 mL/min — ABNORMAL LOW (ref >60)
GFR calc Af Amer: 19 mL/min — ABNORMAL LOW (ref >60)
GFR calc non Af Amer: 13 mL/min — ABNORMAL LOW (ref >60)
GFR calc non Af Amer: 16 mL/min — ABNORMAL LOW (ref >60)
Glucose, Bld: 179 mg/dL — ABNORMAL HIGH (ref 70–99)
Glucose, Bld: 173 mg/dL — ABNORMAL HIGH (ref 70–99)
Phosphorus: 3.5 mg/dL (ref 2.5–4.6)
Phosphorus: 4.6 mg/dL (ref 2.5–4.6)
Potassium: 4.5 mmol/L (ref 3.5–5.1)
Potassium: 4.5 mmol/L (ref 3.5–5.1)
Sodium: 137 mmol/L (ref 135–145)
Sodium: 137 mmol/L (ref 135–145)

## 2019-07-17 LAB — GLUCOSE, CAPILLARY
Glucose-Capillary: 176 mg/dL — ABNORMAL HIGH (ref 70–99)
Glucose-Capillary: 159 mg/dL — ABNORMAL HIGH (ref 70–99)
Glucose-Capillary: 144 mg/dL — ABNORMAL HIGH (ref 70–99)
Glucose-Capillary: 155 mg/dL — ABNORMAL HIGH (ref 70–99)
Glucose-Capillary: 155 mg/dL — ABNORMAL HIGH (ref 70–99)
Glucose-Capillary: 144 mg/dL — ABNORMAL HIGH (ref 70–99)

## 2019-07-17 LAB — POCT ACTIVATED CLOTTING TIME
Activated Clotting Time: 191 seconds
Activated Clotting Time: 186 seconds
Activated Clotting Time: 186 s
Activated Clotting Time: 186 seconds
Activated Clotting Time: 186 seconds
Activated Clotting Time: 191 s
Activated Clotting Time: 186 seconds
Activated Clotting Time: 191 seconds
Activated Clotting Time: 197 s

## 2019-07-17 LAB — CBC
HCT: 24.9 % — ABNORMAL LOW (ref 39.0–52.0)
Hemoglobin: 8.3 g/dL — ABNORMAL LOW (ref 13.0–17.0)
MCH: 34.3 pg — ABNORMAL HIGH (ref 26.0–34.0)
MCHC: 33.3 g/dL (ref 30.0–36.0)
MCV: 102.9 fL — ABNORMAL HIGH (ref 80.0–100.0)
Platelets: 181 10*3/uL (ref 150–400)
RBC: 2.42 MIL/uL — ABNORMAL LOW (ref 4.22–5.81)
RDW: 14.8 % (ref 11.5–15.5)
WBC: 11.1 10*3/uL — ABNORMAL HIGH (ref 4.0–10.5)
nRBC: 0.9 % — ABNORMAL HIGH (ref 0.0–0.2)

## 2019-07-17 LAB — MAGNESIUM: Magnesium: 2.3 mg/dL (ref 1.7–2.4)

## 2019-07-17 LAB — APTT: aPTT: 49 seconds — ABNORMAL HIGH (ref 24–36)

## 2019-07-17 MED ORDER — SODIUM CHLORIDE 0.9 % IV SOLN
2.0000 g | INTRAVENOUS | Status: AC
  Administered 2019-07-17: 2 g via INTRAVENOUS
  Filled 2019-07-17: qty 2

## 2019-07-17 MED ORDER — SODIUM CHLORIDE 0.9 % IV SOLN
2.0000 g | Freq: Two times a day (BID) | INTRAVENOUS | Status: DC
  Administered 2019-07-17 – 2019-07-18 (×2): 2 g via INTRAVENOUS
  Filled 2019-07-17 (×3): qty 2

## 2019-07-17 MED ORDER — SODIUM CHLORIDE 0.9 % IV BOLUS
500.0000 mL | Freq: Once | INTRAVENOUS | Status: AC
  Administered 2019-07-17: 500 mL via INTRAVENOUS

## 2019-07-17 MED ORDER — NOREPINEPHRINE 16 MG/250ML-% IV SOLN
0.0000 ug/min | INTRAVENOUS | Status: DC
  Administered 2019-07-17: 30 ug/min via INTRAVENOUS
  Administered 2019-07-18: 7 ug/min via INTRAVENOUS
  Filled 2019-07-17 (×2): qty 250

## 2019-07-17 NOTE — Progress Notes (Signed)
Transported patient to MRI and back without issues. No issues there or back, patient is currently in the room and back on normal vent settings.
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
## 2019-07-17 NOTE — Procedures (Signed)
Patient Name: John Bailey  MRN: [id number]  Epilepsy Attending: Lora Havens  Referring Physician/Provider: Etta Quill, PA Date: 07/17/2019 Duration: 21.54 minutes  Patient history: 49 year old male with anoxic brain injury.  EEG evaluate for seizures.  Level of alertness: Comatose  AEDs during EEG study: None  Technical aspects: This EEG study was done with scalp electrodes positioned according to the 10-20 International system of electrode placement. Electrical activity was acquired at a sampling rate of 500Hz  and reviewed with a high frequency filter of 70Hz  and a low frequency filter of 1Hz . EEG data were recorded continuously and digitally stored.   Description: EEG showed continuous generalized background suppression.  EEG was not reactive to tactile stimulation.  Hyperventilation and photic stimulation were not performed.  Abnormality -Background suppression, generalized  IMPRESSION: This study is suggestive of profound diffuse encephalopathy, nonspecific etiology but likely secondary to diffuse anoxic/hypoxic brain injury.No seizures or epileptiform discharges were seen throughout the recording.    Sharicka Pogorzelski Barbra Sarks

## 2019-07-17 NOTE — Progress Notes (Signed)
EEG complete - results pending 
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
## 2019-07-17 NOTE — Progress Notes (Signed)
Pharmacy Antibiotic Note  John Bailey is a 49 y.o. male admitted on 06/24/2019 with pneumonia.  Pharmacy has been consulted for Cefepime dosing.  Patient on CRRT.  Plan: Start Cefepime 2 gms IV q12hr Monitor renal fxn/dialysis and clinical status  Height: 6' (182.9 cm) Weight: (!) 147.2 kg (324 lb 8.3 oz) IBW/kg (Calculated) : 77.6  Temp (24hrs), Avg:99.7 F (37.6 C), Min:99.2 F (37.3 C), Max:100.6 F (38.1 C)  Recent Labs  Lab 06/27/2019 1033 06/18/2019 1037 06/21/2019 1048 07/12/2019 1533 07/14/19 0500 07/14/19 1721 07/15/19 0513 07/15/19 1719 07/16/19 0500 07/16/19 1708 07/17/19 0548 07/17/19 0837  WBC 18.7*  --   --   --  18.4*  --   --   --  15.8*  --   --  11.1*  CREATININE  --    < > 13.73*   < > 13.72*   < > 9.31* 7.42* 5.85* 4.78* 4.94*  --   LATICACIDVEN  --   --  >11.0*  --   --   --   --   --   --   --   --   --    < > = values in this interval not displayed.    Estimated Creatinine Clearance: 27.9 mL/min (A) (by C-G formula based on SCr of 4.94 mg/dL (H)).    No Known Allergies  Antimicrobials this admission: Cefepime 5/3 >>  Thank you for allowing pharmacy to be a part of this patient's care.  Alanda Slim, PharmD, [facility] Clinical Pharmacist Please see AMION for all Pharmacists' Contact Phone Numbers 07/17/2019, 10:32 AM
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
## 2019-07-17 NOTE — Progress Notes (Signed)
Patient with episode of vomiting in MRI and high heart rate spoke with MD and place og to low intermittent suction. Ordered to restart CRRT.

## 2019-07-17 NOTE — Progress Notes (Signed)
Patient ID: John Bailey, male   DOB: [birth year], 49 y.o.   MRN: [id number] I met with his wife and another family member at the bedside. I updated them on John Bailey's current clinical course. I advised them of new findings of likely pneumonia. I also further discussed his anoxic brain injury including the latest MRI of the brain findings from last night. I feel his anoxic brain injury is severe and he will not be able to recover to a functional neurologic outcome. I do not feel he will be able to live independently. I have consulted Neurology so they may assess and help with prognostication. The Palliative Care team is also seen him and I appreciate their help. His wife would like to speak with both of these teams as she moves forward with making decisions. Continuing full support for now pending the above.  Georganna Skeans, MD, MPH, FACS Please use [email or web address] to contact on call provider

## 2019-07-18 ENCOUNTER — Inpatient Hospital Stay (HOSPITAL_COMMUNITY): Payer: BC Managed Care – PPO

## 2019-07-18 LAB — CBC
HCT: 25.7 % — ABNORMAL LOW (ref 39.0–52.0)
Hemoglobin: 8.3 g/dL — ABNORMAL LOW (ref 13.0–17.0)
MCH: 33.7 pg (ref 26.0–34.0)
MCHC: 32.3 g/dL (ref 30.0–36.0)
MCV: 104.5 fL — ABNORMAL HIGH (ref 80.0–100.0)
Platelets: 142 10*3/uL — ABNORMAL LOW (ref 150–400)
RBC: 2.46 MIL/uL — ABNORMAL LOW (ref 4.22–5.81)
RDW: 15 % (ref 11.5–15.5)
WBC: 14.7 10*3/uL — ABNORMAL HIGH (ref 4.0–10.5)
nRBC: 0.8 % — ABNORMAL HIGH (ref 0.0–0.2)

## 2019-07-18 LAB — RENAL FUNCTION PANEL
Albumin: 1.9 g/dL — ABNORMAL LOW (ref 3.5–5.0)
Anion gap: 16 — ABNORMAL HIGH (ref 5–15)
BUN: 41 mg/dL — ABNORMAL HIGH (ref 6–20)
CO2: 22 mmol/L (ref 22–32)
Calcium: 9 mg/dL (ref 8.9–10.3)
Chloride: 97 mmol/L — ABNORMAL LOW (ref 98–111)
Creatinine, Ser: 3.61 mg/dL — ABNORMAL HIGH (ref 0.61–1.24)
GFR calc Af Amer: 22 mL/min — ABNORMAL LOW (ref >60)
GFR calc non Af Amer: 19 mL/min — ABNORMAL LOW (ref >60)
Glucose, Bld: 165 mg/dL — ABNORMAL HIGH (ref 70–99)
Phosphorus: 4.7 mg/dL — ABNORMAL HIGH (ref 2.5–4.6)
Potassium: 4.1 mmol/L (ref 3.5–5.1)
Sodium: 135 mmol/L (ref 135–145)

## 2019-07-18 LAB — POCT ACTIVATED CLOTTING TIME
Activated Clotting Time: 186 seconds
Activated Clotting Time: 191 seconds
Activated Clotting Time: 191 seconds
Activated Clotting Time: 191 seconds
Activated Clotting Time: 197 seconds
Activated Clotting Time: 202 seconds
Activated Clotting Time: 202 seconds

## 2019-07-18 LAB — GLUCOSE, CAPILLARY
Glucose-Capillary: 155 mg/dL — ABNORMAL HIGH (ref 70–99)
Glucose-Capillary: 163 mg/dL — ABNORMAL HIGH (ref 70–99)
Glucose-Capillary: 179 mg/dL — ABNORMAL HIGH (ref 70–99)
Glucose-Capillary: 180 mg/dL — ABNORMAL HIGH (ref 70–99)
Glucose-Capillary: 181 mg/dL — ABNORMAL HIGH (ref 70–99)
Glucose-Capillary: 165 mg/dL — ABNORMAL HIGH (ref 70–99)

## 2019-07-18 LAB — APTT: aPTT: 54 seconds — ABNORMAL HIGH (ref 24–36)

## 2019-07-18 LAB — MAGNESIUM: Magnesium: 2.5 mg/dL — ABNORMAL HIGH (ref 1.7–2.4)

## 2019-07-18 MED ORDER — SODIUM CHLORIDE 0.9 % IV SOLN
1.0000 g | INTRAVENOUS | Status: DC
  Filled 2019-07-18: qty 1
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
## 2019-07-18 NOTE — Progress Notes (Signed)
Pharmacy Antibiotic Note  John Bailey is a 49 y.o. male admitted on 07/10/2019 with pneumonia.  Pharmacy has been consulted for Cefepime dosing.  Patient with hx ESRD, initially started on CRRT.  CRRT stopped 5/4.  Will adjust abx accordingly.  Plan: Change Cefepime 1 gm IV q24hr Monitor renal fxn/dialysis and clinical status Follow-up plans for iHD  Height: 6' (182.9 cm) Weight: (!) 143.5 kg (316 lb 5.8 oz) IBW/kg (Calculated) : 77.6  Temp (24hrs), Avg:98 F (36.7 C), Min:97.9 F (36.6 C), Max:98.1 F (36.7 C)  Recent Labs  Lab 06/20/2019 1033 06/22/2019 1037 06/27/2019 1048 06/30/2019 1533 07/14/19 0500 07/14/19 1721 07/16/19 0500 07/16/19 1708 07/17/19 0548 07/17/19 0837 07/17/19 1606 07/18/19 0455  WBC 18.7*  --   --   --  18.4*  --  15.8*  --   --  11.1*  --  14.7*  CREATININE  --    < > 13.73*   < > 13.72*   < > 5.85* 4.78* 4.94*  --  4.13* 3.61*  LATICACIDVEN  --   --  >11.0*  --   --   --   --   --   --   --   --   --    < > = values in this interval not displayed.    Estimated Creatinine Clearance: 37.6 mL/min (A) (by C-G formula based on SCr of 3.61 mg/dL (H)).    No Known Allergies  Antimicrobials this admission: Cefepime 5/3 >>  Thank you for allowing pharmacy to be a part of this patient's care.  [employer], Pharm.D., BCPS Clinical Pharmacist Clinical phone for 07/18/2019 is [phone].  **Pharmacist phone directory can be found on [email or web address] listed under [place].  07/18/2019 3:48 PM
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
## 2019-07-19 ENCOUNTER — Inpatient Hospital Stay (HOSPITAL_COMMUNITY): Payer: BC Managed Care – PPO

## 2019-07-19 DIAGNOSIS — Z7189 Other specified counseling: Secondary | ICD-10-CM

## 2019-07-19 LAB — GLUCOSE, CAPILLARY
Glucose-Capillary: 160 mg/dL — ABNORMAL HIGH (ref 70–99)
Glucose-Capillary: 180 mg/dL — ABNORMAL HIGH (ref 70–99)
Glucose-Capillary: 170 mg/dL — ABNORMAL HIGH (ref 70–99)
Glucose-Capillary: 180 mg/dL — ABNORMAL HIGH (ref 70–99)
Glucose-Capillary: 174 mg/dL — ABNORMAL HIGH (ref 70–99)
Glucose-Capillary: 188 mg/dL — ABNORMAL HIGH (ref 70–99)

## 2019-07-19 LAB — RENAL FUNCTION PANEL
Albumin: 1.7 g/dL — ABNORMAL LOW (ref 3.5–5.0)
Anion gap: 12 (ref 5–15)
BUN: 85 mg/dL — ABNORMAL HIGH (ref 6–20)
CO2: 23 mmol/L (ref 22–32)
Calcium: 8.6 mg/dL — ABNORMAL LOW (ref 8.9–10.3)
Chloride: 100 mmol/L (ref 98–111)
Creatinine, Ser: 5.62 mg/dL — ABNORMAL HIGH (ref 0.61–1.24)
GFR calc Af Amer: 13 mL/min — ABNORMAL LOW (ref >60)
GFR calc non Af Amer: 11 mL/min — ABNORMAL LOW (ref >60)
Glucose, Bld: 205 mg/dL — ABNORMAL HIGH (ref 70–99)
Phosphorus: 2.8 mg/dL (ref 2.5–4.6)
Potassium: 3.8 mmol/L (ref 3.5–5.1)
Sodium: 135 mmol/L (ref 135–145)

## 2019-07-19 LAB — CBC
HCT: 22.9 % — ABNORMAL LOW (ref 39.0–52.0)
Hemoglobin: 7.4 g/dL — ABNORMAL LOW (ref 13.0–17.0)
MCH: 33 pg (ref 26.0–34.0)
MCHC: 32.3 g/dL (ref 30.0–36.0)
MCV: 102.2 fL — ABNORMAL HIGH (ref 80.0–100.0)
Platelets: 133 10*3/uL — ABNORMAL LOW (ref 150–400)
RBC: 2.24 MIL/uL — ABNORMAL LOW (ref 4.22–5.81)
RDW: 15 % (ref 11.5–15.5)
WBC: 16.8 10*3/uL — ABNORMAL HIGH (ref 4.0–10.5)
nRBC: 1.2 % — ABNORMAL HIGH (ref 0.0–0.2)

## 2019-07-19 MED ORDER — SODIUM CHLORIDE 0.9 % IV SOLN
2.0000 g | INTRAVENOUS | Status: DC
  Filled 2019-07-19: qty 2

## 2019-07-19 MED ORDER — CEFAZOLIN SODIUM-DEXTROSE 2-4 GM/100ML-% IV SOLN
2.0000 g | INTRAVENOUS | Status: DC
  Administered 2019-07-19 – 2019-07-20 (×2): 2 g via INTRAVENOUS
  Filled 2019-07-19 (×2): qty 100
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
## 2019-07-19 NOTE — Progress Notes (Signed)
A family meeting was coordinated to include pt's wife, two of his sisters and potentially his father for 4pm today to talk about comfort measures and goals of care. Dr. Bobbye Morton aware and will lead the discussion. Front Information desk knows of these additional visitors.  John Bailey, Rande Brunt, RN
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
## 2019-07-20 DIAGNOSIS — T1490XA Injury, unspecified, initial encounter: Principal | ICD-10-CM

## 2019-07-20 DIAGNOSIS — Z515 Encounter for palliative care: Secondary | ICD-10-CM

## 2019-07-20 LAB — RENAL FUNCTION PANEL
Albumin: 1.6 g/dL — ABNORMAL LOW (ref 3.5–5.0)
Anion gap: 17 — ABNORMAL HIGH (ref 5–15)
BUN: 113 mg/dL — ABNORMAL HIGH (ref 6–20)
CO2: 21 mmol/L — ABNORMAL LOW (ref 22–32)
Calcium: 8.6 mg/dL — ABNORMAL LOW (ref 8.9–10.3)
Chloride: 98 mmol/L (ref 98–111)
Creatinine, Ser: 7.51 mg/dL — ABNORMAL HIGH (ref 0.61–1.24)
GFR calc Af Amer: 9 mL/min — ABNORMAL LOW (ref >60)
GFR calc non Af Amer: 8 mL/min — ABNORMAL LOW (ref >60)
Glucose, Bld: 207 mg/dL — ABNORMAL HIGH (ref 70–99)
Phosphorus: 3.4 mg/dL (ref 2.5–4.6)
Potassium: 3.7 mmol/L (ref 3.5–5.1)
Sodium: 136 mmol/L (ref 135–145)

## 2019-07-20 LAB — CBC
HCT: 21.5 % — ABNORMAL LOW (ref 39.0–52.0)
Hemoglobin: 7.1 g/dL — ABNORMAL LOW (ref 13.0–17.0)
MCH: 33.3 pg (ref 26.0–34.0)
MCHC: 33 g/dL (ref 30.0–36.0)
MCV: 100.9 fL — ABNORMAL HIGH (ref 80.0–100.0)
Platelets: 140 10*3/uL — ABNORMAL LOW (ref 150–400)
RBC: 2.13 MIL/uL — ABNORMAL LOW (ref 4.22–5.81)
RDW: 15.2 % (ref 11.5–15.5)
WBC: 21.8 10*3/uL — ABNORMAL HIGH (ref 4.0–10.5)
nRBC: 0.3 % — ABNORMAL HIGH (ref 0.0–0.2)

## 2019-07-20 LAB — GLUCOSE, CAPILLARY
Glucose-Capillary: 199 mg/dL — ABNORMAL HIGH (ref 70–99)
Glucose-Capillary: 185 mg/dL — ABNORMAL HIGH (ref 70–99)
Glucose-Capillary: 207 mg/dL — ABNORMAL HIGH (ref 70–99)
Glucose-Capillary: 223 mg/dL — ABNORMAL HIGH (ref 70–99)
Glucose-Capillary: 193 mg/dL — ABNORMAL HIGH (ref 70–99)
Glucose-Capillary: 182 mg/dL — ABNORMAL HIGH (ref 70–99)

## 2019-07-20 LAB — CULTURE, RESPIRATORY W GRAM STAIN

## 2019-07-20 MED ORDER — CHLORHEXIDINE GLUCONATE CLOTH 2 % EX PADS
6.0000 | MEDICATED_PAD | Freq: Every day | CUTANEOUS | Status: DC
  Administered 2019-07-21 (×2): 6 via TOPICAL
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
## 2019-07-20 NOTE — Progress Notes (Signed)
Will hold HD today in light of plans for withdrawal of life support/ medical care planned for tomorrow.   Kelly Splinter, MD 07/20/2019, 1:42 PM
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
## 2019-07-21 DIAGNOSIS — Z515 Encounter for palliative care: Secondary | ICD-10-CM

## 2019-07-21 LAB — CBC
HCT: 21.4 % — ABNORMAL LOW (ref 39.0–52.0)
Hemoglobin: 7.1 g/dL — ABNORMAL LOW (ref 13.0–17.0)
MCH: 33.5 pg (ref 26.0–34.0)
MCHC: 33.2 g/dL (ref 30.0–36.0)
MCV: 100.9 fL — ABNORMAL HIGH (ref 80.0–100.0)
Platelets: 165 10*3/uL (ref 150–400)
RBC: 2.12 MIL/uL — ABNORMAL LOW (ref 4.22–5.81)
RDW: 15.6 % — ABNORMAL HIGH (ref 11.5–15.5)
WBC: 25.2 10*3/uL — ABNORMAL HIGH (ref 4.0–10.5)
nRBC: 0.2 % (ref 0.0–0.2)

## 2019-07-21 LAB — GLUCOSE, CAPILLARY
Glucose-Capillary: 162 mg/dL — ABNORMAL HIGH (ref 70–99)
Glucose-Capillary: 177 mg/dL — ABNORMAL HIGH (ref 70–99)
Glucose-Capillary: 159 mg/dL — ABNORMAL HIGH (ref 70–99)

## 2019-07-21 MED ORDER — BIOTENE DRY MOUTH MT LIQD: 15.0000 mL | Freq: Two times a day (BID) | OROMUCOSAL | Status: DC

## 2019-07-21 MED ORDER — GLYCOPYRROLATE 1 MG PO TABS
1.0000 mg | ORAL_TABLET | ORAL | Status: DC | PRN
  Filled 2019-07-21: qty 1

## 2019-07-21 MED ORDER — GLYCOPYRROLATE 0.2 MG/ML IJ SOLN: 0.2000 mg | INTRAMUSCULAR | Status: DC | PRN

## 2019-07-21 MED ORDER — LORAZEPAM 2 MG/ML IJ SOLN
1.0000 mg | INTRAMUSCULAR | Status: DC | PRN
  Administered 2019-07-21: 1 mg via INTRAVENOUS
  Filled 2019-07-21: qty 1

## 2019-07-21 MED ORDER — FENTANYL CITRATE (PF) 100 MCG/2ML IJ SOLN
50.0000 ug | INTRAMUSCULAR | Status: DC | PRN
Start: 2019-07-21 — End: 2019-07-21

## 2019-07-21 MED ORDER — FENTANYL CITRATE (PF) 100 MCG/2ML IJ SOLN: 50.0000 ug | INTRAMUSCULAR | Status: DC | PRN

## 2019-07-21 MED ORDER — LORAZEPAM 1 MG PO TABS: 1.0000 mg | ORAL_TABLET | ORAL | Status: DC | PRN

## 2019-07-21 MED ORDER — LORAZEPAM 2 MG/ML PO CONC: 1.0000 mg | ORAL | Status: DC | PRN

## 2019-07-24 ENCOUNTER — Encounter: Payer: Self-pay | Admitting: Podiatry

## 2019-07-24 LAB — CULTURE, BLOOD (ROUTINE X 2)
Culture: NO GROWTH
Culture: NO GROWTH
Special Requests: ADEQUATE
Special Requests: ADEQUATE

## 2019-08-01 ENCOUNTER — Ambulatory Visit: Payer: BC Managed Care – PPO | Admitting: Podiatry

## 2019-08-15 NOTE — Progress Notes (Signed)
Palliative care is consulting. Withdrawal of life support/ medical care is planned for today.    Neurology PA spoke with family yesterday, who elected not to have follow up exam.   Electronically signed: Dr. Kerney Elbe

## 2019-08-15 NOTE — Progress Notes (Signed)
Patient ID: John Bailey, male   DOB: 06/22/1970, 49 y.o.   MRN: [id number] I spoke with his wife again this afternoon. She expressed appreciation for our care. Other family members have arrived and terminal extubation was done per Palliative orders.  Georganna Skeans, MD, MPH, FACS Please use [email or web address] to contact on call provider

## 2019-08-15 NOTE — Procedures (Signed)
Extubation Procedure Note  Patient Details:   Name: John Bailey DOB: 01-25-1971 MRN: [id number]   Airway Documentation:    Vent end date: 08-09-2019 Vent end time: 1555   Evaluation  O2 sats: currently acceptable Complications: No apparent complications Patient did tolerate procedure well. Bilateral Breath Sounds: Diminished, Rhonchi   No   Pt was extubated per MD order and per pt family request to comfort measures. RT will monitor pt status.   Yesly Gerety A Yaritsa Savarino 2019-08-09, 3:59 PM
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
## 2019-08-15 NOTE — Death Summary Note (Signed)
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 IMPRESSION: Stable bilateral chest tubes.  No pneumothorax is seen. Endotracheal tube terminates 2.4 cm above the carina. Left upper lobe opacity, likely reflecting a contusion when correlating with prior CT chest. Progressive right lower lobe/infrahilar opacity, possibly atelectasis, although raising concern for aspiration/pneumonia. Superimposed mild interstitial edema is suspected. Electronically Signed   By: Julian Hy M.D.   On: 07/17/2019 09:02   DG Chest Port 1 View  Result Date: 07/15/2019 CLINICAL DATA:  Respiratory failure EXAM: PORTABLE CHEST 1 VIEW COMPARISON:  July 14, 2019 FINDINGS: Bilateral chest tubes are in place. There is no convincing pneumothorax. The left-sided central venous catheter tip is positioned at the distal SVC. The right-sided central venous catheter is unchanged in positioning. The endotracheal tube terminates above the carina prior approximately 2.6 cm. The enteric tube extends below the left hemidiaphragm. There is a worsening airspace opacity in the right mid lung zone. Vascular congestion/pulmonary edema is again noted. There are probable small bilateral pleural effusions. IMPRESSION: 1. Lines and tubes as above.  No convincing pneumothorax. 2. Pulmonary edema is again noted. 3. Worsening right mid lung  zone airspace opacity which may represent atelectasis or developing infiltrate. Electronically Signed   By: Constance Holster M.D.   On: 07/15/2019 16:00   DG Chest Port 1 View  Result Date: 07/14/2019 CLINICAL DATA:  Bilateral pneumothorax. EXAM: PORTABLE CHEST 1 VIEW COMPARISON:  July 13, 2019. FINDINGS: Stable cardiomediastinal silhouette. Endotracheal and nasogastric tubes are unchanged in position. Slightly increased bilateral lung opacities are noted concerning for worsening edema or possibly inflammation. Bilateral chest tubes are unchanged in position. Possible minimal right apical pneumothorax may be present. Right internal jugular catheter is again noted. Bony thorax is unremarkable. IMPRESSION: Stable support apparatus. Bilateral chest tubes are unchanged in position. Possible minimal right apical pneumothorax may be present. Slightly increased bilateral lung opacities are noted concerning for worsening edema or possibly inflammation. Electronically Signed   By: Marijo Conception M.D.   On: 07/14/2019 08:28   DG Chest Port 1 View  Result Date: 06/24/2019 CLINICAL DATA:  Trauma. EXAM: PORTABLE CHEST 1 VIEW COMPARISON:  06/14/2017 and 05/04/2017 FINDINGS: Endotracheal tube has tip 3 cm above the carina. Right IJ central venous catheter with tip in the region of the SVC obliquely oriented medially. Bilateral chest tubes in place. Lungs are adequately inflated demonstrate continued hazy prominence of the perihilar markings which may be due to mild vascular congestion. No effusion or pneumothorax. Mild stable cardiomegaly. Remainder of the exam is unchanged. IMPRESSION: Cardiomegaly with suggestion of mild vascular congestion without significant change. Tubes and lines as described.  No pneumothorax. Electronically Signed   By: Marin Olp M.D.   On: 06/22/2019 11:28   DG Abd Portable 1V  Result Date: 06/25/2019 CLINICAL DATA:  Check gastric catheter placement EXAM: PORTABLE ABDOMEN - 1 VIEW  COMPARISON:  None. FINDINGS: Gastric catheter is noted within the distal aspect of the stomach. No obstructive changes are seen. IMPRESSION: Gastric catheter within the stomach Electronically Signed   By: Inez Catalina M.D.   On: 07/12/2019 22:38    Microbiology No results found for this or any previous visit (from the past 240 hour(s)).  Lab Basic Metabolic Panel: No results for input(s): NA, K, CL, CO2, GLUCOSE, BUN, CREATININE, CALCIUM, MG, PHOS in the last 168 hours. Liver Function Tests: No results for input(s): AST, ALT, ALKPHOS, BILITOT, PROT, ALBUMIN in the last 168 hours. No results for input(s): LIPASE, AMYLASE in the last 168 hours. No results for input(s): AMMONIA in the last 168 hours. CBC: No results for input(s): WBC, NEUTROABS, HGB, HCT, MCV, PLT in the last 168 hours. Cardiac Enzymes: No results for input(s): CKTOTAL, CKMB, CKMBINDEX, TROPONINI in the last 168 hours. Sepsis Labs: No results for input(s): PROCALCITON, WBC, LATICACIDVEN in the last 168 hours.  Procedures/Operations  Intubation, central venous access   Zenovia Jarred 08/02/2019, 6:27 AM

## 2019-08-15 NOTE — Progress Notes (Signed)
Chaplain was present with family as care was withdrawn.  Brion Aliment Chaplain Resident For questions concerning this note please contact me by pager [phone]

## 2019-08-15 NOTE — Death Summary Note (Cosign Needed)
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 lobe/infrahilar opacity, possibly atelectasis, although raising concern for aspiration/pneumonia. Additional mild lingular and right midlung opacity, nonspecific. Indwelling bilateral chest tubes.  No pneumothorax is seen. Superimposed mild interstitial edema. No definite pleural effusions. Right IJ chest tube terminates in the lower SVC. Left IJ chest tube terminates the cavoatrial junction. The heart is top-normal in size. IMPRESSION: Stable bilateral chest tubes.  No pneumothorax is seen. Endotracheal tube terminates 2.4 cm above the carina. Left upper lobe opacity, likely reflecting a contusion when correlating with prior CT chest. Progressive right lower lobe/infrahilar opacity, possibly atelectasis, although raising concern for aspiration/pneumonia. Superimposed mild interstitial edema is suspected. Electronically Signed   By: Julian Hy M.D.   On: 07/17/2019 09:02   DG Chest Port 1 View  Result Date: 07/15/2019 CLINICAL DATA:  Respiratory failure EXAM: PORTABLE CHEST 1 VIEW COMPARISON:  July 14, 2019 FINDINGS: Bilateral chest tubes are in place. There is no convincing pneumothorax. The left-sided central venous catheter tip is positioned at the distal SVC. The right-sided central venous catheter is unchanged in positioning. The endotracheal tube  terminates above the carina prior approximately 2.6 cm. The enteric tube extends below the left hemidiaphragm. There is a worsening airspace opacity in the right mid lung zone. Vascular congestion/pulmonary edema is again noted. There are probable small bilateral pleural effusions. IMPRESSION: 1. Lines and tubes as above.  No convincing pneumothorax. 2. Pulmonary edema is again noted. 3. Worsening right mid lung zone airspace opacity which may represent atelectasis or developing infiltrate. Electronically Signed   By: Constance Holster M.D.   On: 07/15/2019 16:00   DG Chest Port 1 View  Result Date: 07/14/2019 CLINICAL DATA:  Bilateral pneumothorax. EXAM: PORTABLE CHEST 1 VIEW COMPARISON:  July 13, 2019. FINDINGS: Stable cardiomediastinal silhouette. Endotracheal and nasogastric tubes are unchanged in position. Slightly increased bilateral lung opacities are noted concerning for worsening edema or possibly inflammation. Bilateral chest tubes are unchanged in position. Possible minimal right apical pneumothorax may be present. Right internal jugular catheter is again noted. Bony thorax is unremarkable. IMPRESSION: Stable support apparatus. Bilateral chest tubes are unchanged in position. Possible minimal right apical pneumothorax may be present. Slightly increased bilateral lung opacities are noted concerning for worsening edema or possibly inflammation. Electronically Signed   By: Marijo Conception M.D.   On: 07/14/2019 08:28   DG Chest Port 1 View  Result Date: 06/17/2019 CLINICAL DATA:  Trauma. EXAM: PORTABLE CHEST 1 VIEW COMPARISON:  06/14/2017 and 05/04/2017 FINDINGS: Endotracheal tube has tip 3 cm above the carina. Right IJ central venous catheter with tip in the region of the SVC obliquely oriented medially. Bilateral chest tubes in place. Lungs are adequately inflated demonstrate continued hazy prominence of the perihilar markings which may be due to mild vascular congestion. No effusion or  pneumothorax. Mild stable cardiomegaly. Remainder of the exam is unchanged. IMPRESSION: Cardiomegaly with suggestion of mild vascular congestion without significant change. Tubes and lines as described.  No pneumothorax. Electronically Signed   By: Marin Olp M.D.   On: 07/14/2019 11:28   DG Abd Portable 1V  Result Date: 07/02/2019 CLINICAL DATA:  Check gastric catheter placement EXAM: PORTABLE ABDOMEN - 1 VIEW COMPARISON:  None. FINDINGS: Gastric catheter is noted within  the distal aspect of the stomach. No obstructive changes are seen. IMPRESSION: Gastric catheter within the stomach Electronically Signed   By: Inez Catalina M.D.   On: 07/08/2019 22:38    Microbiology Recent Results (from the past 240 hour(s))  Culture, blood (routine x 2)     Status: None   Collection Time: 07/19/19  1:08 PM   Specimen: BLOOD  Result Value Ref Range Status   Specimen Description BLOOD SITE NOT SPECIFIED  Final   Special Requests IN PEDIATRIC BOTTLE Blood Culture adequate volume  Final   Culture   Final    NO GROWTH 5 DAYS Performed at [facility], [address], [place], [place] [address]    Report Status 07/24/2019 FINAL  Final  Culture, blood (routine x 2)     Status: None   Collection Time: 07/19/19  1:08 PM   Specimen: BLOOD  Result Value Ref Range Status   Specimen Description BLOOD SITE NOT SPECIFIED  Final   Special Requests IN PEDIATRIC BOTTLE Blood Culture adequate volume  Final   Culture   Final    NO GROWTH 5 DAYS Performed at [facility], [place] [address], [place],  [address]    Report Status 07/24/2019 FINAL  Final    Lab Basic Metabolic Panel: No results for input(s): NA, K, CL, CO2, GLUCOSE, BUN, CREATININE, CALCIUM, MG, PHOS in the last 168 hours. Liver Function Tests: No results for input(s): AST, ALT, ALKPHOS, BILITOT, PROT, ALBUMIN in the last 168 hours. No results for input(s): LIPASE, AMYLASE in the last 168 hours. No results for input(s): AMMONIA in  the last 168 hours. CBC: Recent Labs  Lab Aug 03, 2019 0516  WBC 25.2*  HGB 7.1*  HCT 21.4*  MCV 100.9*  PLT 165   Cardiac Enzymes: No results for input(s): CKTOTAL, CKMB, CKMBINDEX, TROPONINI in the last 168 hours. Sepsis Labs: Recent Labs  Lab 08-03-19 0516  WBC 25.2*    Procedures/Operations  1. Bilateral chest tube insertion - 06/26/2019 Dr. Georganna Skeans 2. Left IJ temp cath placement - 07/14/19 Dr. Debbora Presto 07/27/2019, 9:33 AM
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
## 2019-08-15 DEATH — deceased
